# Patient Record
Sex: Female | Born: 1980
Health system: Southern US, Community
[De-identification: ages and names within clinical notes are randomized; demographics above are authoritative.]

## PROBLEM LIST (undated history)

## (undated) DIAGNOSIS — G4733 Obstructive sleep apnea (adult) (pediatric): Secondary | ICD-10-CM

## (undated) DIAGNOSIS — F419 Anxiety disorder, unspecified: Secondary | ICD-10-CM

## (undated) DIAGNOSIS — E119 Type 2 diabetes mellitus without complications: Secondary | ICD-10-CM

## (undated) DIAGNOSIS — M722 Plantar fascial fibromatosis: Secondary | ICD-10-CM

## (undated) DIAGNOSIS — K5909 Other constipation: Secondary | ICD-10-CM

## (undated) DIAGNOSIS — G4730 Sleep apnea, unspecified: Secondary | ICD-10-CM

## (undated) DIAGNOSIS — I10 Essential (primary) hypertension: Secondary | ICD-10-CM

## (undated) DIAGNOSIS — R0602 Shortness of breath: Secondary | ICD-10-CM

## (undated) DIAGNOSIS — K219 Gastro-esophageal reflux disease without esophagitis: Secondary | ICD-10-CM

## (undated) DIAGNOSIS — E782 Mixed hyperlipidemia: Secondary | ICD-10-CM

## (undated) DIAGNOSIS — U071 COVID-19: Secondary | ICD-10-CM

## (undated) DIAGNOSIS — Z9641 Presence of insulin pump (external) (internal): Secondary | ICD-10-CM

## (undated) DIAGNOSIS — Z87898 Personal history of other specified conditions: Secondary | ICD-10-CM

## (undated) DIAGNOSIS — R569 Unspecified convulsions: Secondary | ICD-10-CM

## (undated) DIAGNOSIS — Z794 Long term (current) use of insulin: Secondary | ICD-10-CM

## (undated) DIAGNOSIS — F32A Depression, unspecified: Secondary | ICD-10-CM

## (undated) DIAGNOSIS — M1990 Unspecified osteoarthritis, unspecified site: Secondary | ICD-10-CM

## (undated) DIAGNOSIS — T7840XA Allergy, unspecified, initial encounter: Secondary | ICD-10-CM

## (undated) HISTORY — PX: WISDOM TOOTH EXTRACTION: SHX21

## (undated) HISTORY — PX: TUBAL LIGATION: SHX77

## (undated) HISTORY — DX: Sleep apnea, unspecified: G47.30

## (undated) HISTORY — DX: Unspecified osteoarthritis, unspecified site: M19.90

## (undated) HISTORY — DX: Allergy, unspecified, initial encounter: T78.40XA

## (undated) HISTORY — PX: HERNIA REPAIR: SHX51

---
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
## 2001-07-18 ENCOUNTER — Inpatient Hospital Stay (HOSPITAL_COMMUNITY): Admission: AD | Admit: 2001-07-18 | Discharge: 2001-07-18 | Payer: Self-pay | Admitting: *Deleted

## 2001-07-29 ENCOUNTER — Ambulatory Visit (HOSPITAL_COMMUNITY): Admission: RE | Admit: 2001-07-29 | Discharge: 2001-07-29 | Payer: Self-pay | Admitting: *Deleted

## 2001-09-08 ENCOUNTER — Inpatient Hospital Stay (HOSPITAL_COMMUNITY): Admission: AD | Admit: 2001-09-08 | Discharge: 2001-09-08 | Payer: Self-pay | Admitting: *Deleted

## 2001-09-23 ENCOUNTER — Inpatient Hospital Stay (HOSPITAL_COMMUNITY): Admission: AD | Admit: 2001-09-23 | Discharge: 2001-09-23 | Payer: Self-pay | Admitting: *Deleted

## 2001-11-20 ENCOUNTER — Inpatient Hospital Stay (HOSPITAL_COMMUNITY): Admission: AD | Admit: 2001-11-20 | Discharge: 2001-11-20 | Payer: Self-pay | Admitting: *Deleted

## 2001-12-31 ENCOUNTER — Inpatient Hospital Stay (HOSPITAL_COMMUNITY): Admission: AD | Admit: 2001-12-31 | Discharge: 2001-12-31 | Payer: Self-pay | Admitting: Obstetrics and Gynecology

## 2002-01-02 ENCOUNTER — Encounter (HOSPITAL_COMMUNITY): Admission: RE | Admit: 2002-01-02 | Discharge: 2002-01-02 | Payer: Self-pay | Admitting: Obstetrics and Gynecology

## 2002-01-04 ENCOUNTER — Inpatient Hospital Stay (HOSPITAL_COMMUNITY): Admission: AD | Admit: 2002-01-04 | Discharge: 2002-01-07 | Payer: Self-pay | Admitting: *Deleted

## 2002-04-03 ENCOUNTER — Emergency Department (HOSPITAL_COMMUNITY): Admission: EM | Admit: 2002-04-03 | Discharge: 2002-04-03 | Payer: Self-pay | Admitting: Emergency Medicine

## 2002-04-16 ENCOUNTER — Emergency Department (HOSPITAL_COMMUNITY): Admission: EM | Admit: 2002-04-16 | Discharge: 2002-04-16 | Payer: Self-pay | Admitting: Emergency Medicine

## 2002-05-12 ENCOUNTER — Emergency Department (HOSPITAL_COMMUNITY): Admission: EM | Admit: 2002-05-12 | Discharge: 2002-05-12 | Payer: Self-pay | Admitting: Emergency Medicine

## 2002-07-09 ENCOUNTER — Emergency Department (HOSPITAL_COMMUNITY): Admission: EM | Admit: 2002-07-09 | Discharge: 2002-07-09 | Payer: Self-pay | Admitting: Emergency Medicine

## 2002-08-28 ENCOUNTER — Emergency Department (HOSPITAL_COMMUNITY): Admission: AD | Admit: 2002-08-28 | Discharge: 2002-08-28 | Payer: Self-pay | Admitting: Emergency Medicine

## 2002-11-30 ENCOUNTER — Emergency Department (HOSPITAL_COMMUNITY): Admission: EM | Admit: 2002-11-30 | Discharge: 2002-12-01 | Payer: Self-pay | Admitting: Emergency Medicine

## 2003-01-02 ENCOUNTER — Emergency Department (HOSPITAL_COMMUNITY): Admission: EM | Admit: 2003-01-02 | Discharge: 2003-01-02 | Payer: Self-pay

## 2003-04-14 ENCOUNTER — Emergency Department (HOSPITAL_COMMUNITY): Admission: EM | Admit: 2003-04-14 | Discharge: 2003-04-14 | Payer: Self-pay | Admitting: Emergency Medicine

## 2003-07-20 ENCOUNTER — Emergency Department (HOSPITAL_COMMUNITY): Admission: EM | Admit: 2003-07-20 | Discharge: 2003-07-21 | Payer: Self-pay | Admitting: Emergency Medicine

## 2003-08-23 ENCOUNTER — Emergency Department (HOSPITAL_COMMUNITY): Admission: EM | Admit: 2003-08-23 | Discharge: 2003-08-23 | Payer: Self-pay | Admitting: Emergency Medicine

## 2004-01-01 ENCOUNTER — Emergency Department (HOSPITAL_COMMUNITY): Admission: EM | Admit: 2004-01-01 | Discharge: 2004-01-01 | Payer: Self-pay | Admitting: Vascular Surgery

## 2004-03-31 ENCOUNTER — Emergency Department (HOSPITAL_COMMUNITY): Admission: EM | Admit: 2004-03-31 | Discharge: 2004-03-31 | Payer: Self-pay | Admitting: Emergency Medicine

## 2004-06-19 ENCOUNTER — Emergency Department (HOSPITAL_COMMUNITY): Admission: EM | Admit: 2004-06-19 | Discharge: 2004-06-19 | Payer: Self-pay | Admitting: Emergency Medicine

## 2005-03-06 ENCOUNTER — Emergency Department (HOSPITAL_COMMUNITY): Admission: EM | Admit: 2005-03-06 | Discharge: 2005-03-06 | Payer: Self-pay | Admitting: Emergency Medicine

## 2005-05-07 ENCOUNTER — Emergency Department (HOSPITAL_COMMUNITY): Admission: EM | Admit: 2005-05-07 | Discharge: 2005-05-07 | Payer: Self-pay | Admitting: Family Medicine

## 2005-12-21 ENCOUNTER — Emergency Department (HOSPITAL_COMMUNITY): Admission: EM | Admit: 2005-12-21 | Discharge: 2005-12-21 | Payer: Self-pay | Admitting: Emergency Medicine

## 2006-03-23 ENCOUNTER — Inpatient Hospital Stay (HOSPITAL_COMMUNITY): Admission: AD | Admit: 2006-03-23 | Discharge: 2006-03-23 | Payer: Self-pay | Admitting: Family Medicine

## 2006-03-28 ENCOUNTER — Ambulatory Visit (HOSPITAL_COMMUNITY): Admission: RE | Admit: 2006-03-28 | Discharge: 2006-03-28 | Payer: Self-pay | Admitting: Obstetrics & Gynecology

## 2006-04-23 ENCOUNTER — Ambulatory Visit (HOSPITAL_COMMUNITY): Admission: RE | Admit: 2006-04-23 | Discharge: 2006-04-23 | Payer: Self-pay | Admitting: Obstetrics & Gynecology

## 2006-06-28 ENCOUNTER — Inpatient Hospital Stay (HOSPITAL_COMMUNITY): Admission: AD | Admit: 2006-06-28 | Discharge: 2006-06-28 | Payer: Self-pay | Admitting: Obstetrics

## 2006-08-23 ENCOUNTER — Inpatient Hospital Stay (HOSPITAL_COMMUNITY): Admission: AD | Admit: 2006-08-23 | Discharge: 2006-08-24 | Payer: Self-pay | Admitting: Obstetrics

## 2006-08-30 ENCOUNTER — Inpatient Hospital Stay (HOSPITAL_COMMUNITY): Admission: AD | Admit: 2006-08-30 | Discharge: 2006-08-30 | Payer: Self-pay | Admitting: Obstetrics & Gynecology

## 2006-09-25 ENCOUNTER — Inpatient Hospital Stay (HOSPITAL_COMMUNITY): Admission: RE | Admit: 2006-09-25 | Discharge: 2006-09-26 | Payer: Self-pay | Admitting: Obstetrics

## 2006-09-27 ENCOUNTER — Inpatient Hospital Stay (HOSPITAL_COMMUNITY): Admission: AD | Admit: 2006-09-27 | Discharge: 2006-09-30 | Payer: Self-pay | Admitting: Obstetrics & Gynecology

## 2006-09-29 HISTORY — PX: TUBAL LIGATION: SHX77

## 2006-09-30 ENCOUNTER — Encounter (INDEPENDENT_AMBULATORY_CARE_PROVIDER_SITE_OTHER): Payer: Self-pay | Admitting: Specialist

## 2007-04-14 ENCOUNTER — Emergency Department (HOSPITAL_COMMUNITY): Admission: EM | Admit: 2007-04-14 | Discharge: 2007-04-14 | Payer: Self-pay | Admitting: Emergency Medicine

## 2007-05-31 ENCOUNTER — Emergency Department (HOSPITAL_COMMUNITY): Admission: EM | Admit: 2007-05-31 | Discharge: 2007-05-31 | Payer: Self-pay | Admitting: Emergency Medicine

## 2010-03-16 ENCOUNTER — Emergency Department (HOSPITAL_COMMUNITY): Admission: EM | Admit: 2010-03-16 | Discharge: 2010-03-16 | Payer: Self-pay | Admitting: Emergency Medicine

## 2010-06-17 ENCOUNTER — Emergency Department (HOSPITAL_COMMUNITY)
Admission: EM | Admit: 2010-06-17 | Discharge: 2010-06-17 | Payer: Self-pay | Source: Home / Self Care | Admitting: Emergency Medicine

## 2010-06-19 LAB — URINALYSIS, ROUTINE W REFLEX MICROSCOPIC
Bilirubin Urine: NEGATIVE
Ketones, ur: NEGATIVE mg/dL
Nitrite: NEGATIVE
Protein, ur: NEGATIVE mg/dL
Specific Gravity, Urine: 1.024 (ref 1.005–1.030)
Urine Glucose, Fasting: NEGATIVE mg/dL
Urobilinogen, UA: 2 mg/dL — ABNORMAL HIGH (ref 0.0–1.0)
pH: 6 (ref 5.0–8.0)

## 2010-06-19 LAB — URINE MICROSCOPIC-ADD ON

## 2010-06-19 LAB — POCT PREGNANCY, URINE: Preg Test, Ur: NEGATIVE

## 2010-10-20 NOTE — H&P (Signed)
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   HISTORY OF PRESENT ILLNESS:  Please see the above.  The patient also  complains of an earache.  She denies any congestion or fever.  She  denies any pirulent discharge from the ear.   ALLERGIES:  No known drug allergies.   MEDICATIONS:  None.   RISK FACTORS:  History of a previous cesarean delivery, positive  gonorrhea and chlamydia probes earlier in the pregnancy.   PRENATAL LABORATORIES:  Chlamydia probe negative June 08, 2006, GC  probe negative June 08, 2006.  Urine culture and sensitivity  insignificant growth.  Hematocrit 36.8, hemoglobin 12.3, HIV  nonreactive, hepatis B surface antigen negative, platelets 261,000,  blood type O positive, antibody screen negative, RPR nonreactive,  rubella immune, sickle cell negative, Pap smears negative.  GBS  negative.   PAST GYNECOLOGICAL HISTORY:  History of chlamydia and gonorrhea.   PAST SURGICAL HISTORY:  She denies past surgical history.  Please see  the above.   SOCIAL HISTORY:  She is unemployed, single, never married.  She does not  give any significant history of alcohol abuse.  She has no significant  smoking history.  Denies illicit drug use.   FAMILY HISTORY:  Adult onset diabetes, breast cancer, leukemia.   PAST OBSTETRICAL HISTORY:  In October 1999 she was delivered of a  liveborn female at 41 weeks, 8 pounds 1 oz via  cesarean delivery secondary  to failure to progress and pregnancy-induced hypertension.  In February  2002 she was delivered of a live boy female 7 pounds 11 oz full term,  VBAC, no complications.  In August 2003 she was delivered of a live boy  female 7 pounds 3 oz at 41+ weeks, VBAC, no complications.   PHYSICAL EXAMINATION:  VITAL SIGNS:  Stable, afebrile.  HEART:  Fetal heart racing: reassuring; tocodynamometer: regular uterine  contractions.  ABDOMEN:  Gravid, nontender.  GENITORECTAL:  Sterile vaginal exam by me, cervix is very posterior  approximately 50% effaced.   ASSESSMENT:  Intrauterine pregnancy at term, history of previous  cesarean delivery and vaginal birth after cesarean section now for trial  of labor.  Pregnancy complicated by pelvic pain issues.  At present the  Bishop's score is unfavorable.  Questionable otitis.   PLAN:  Admission induction of labor, possible mechanical induction with  Foley bulb.  Will treat empirically with antibiotics and analgesics for  earache.      Roseanna Rainbow, M.D.  Electronically Signed     LAJ/MEDQ  D:  09/25/2006  T:  09/25/2006  Job:  [id number]

## 2010-10-20 NOTE — H&P (Signed)
NAME:  Sarah Garrison, FENCL             ACCOUNT NO.:  [id number]   MEDICAL RECORD NO.:  [id number]          PATIENT TYPE:  INP   LOCATION:  9170                          [facility]ACILITY:  [facility]   PHYSICIAN:  Roseanna Rainbow, M.D.DATE OF BIRTH:  1980/12/01   DATE OF ADMISSION:  09/27/2006  DATE OF DISCHARGE:                              HISTORY & PHYSICAL   CHIEF COMPLAINT:  The patient is a 30 year old para 3 with an estimated  date of confinement of September 25, 2006, with an intrauterine pregnancy at  40+ weeks, history of a previous cesarean delivery and several VBACs  complaining of uterine contractions.   HISTORY OF PRESENT ILLNESS:  Please see the above.   ALLERGIES:  NO KNOWN DRUG ALLERGIES.   MEDICATIONS:  None.   OBSTETRICAL RISK FACTORS:  1. Previous cesarean delivery.  2. Positive gonorrhea and Chlamydia probes.  3. Asthma.   PRENATAL SCREENING:  Chlamydia, D&E probe negative.  GC probe negative.  Urine culture and sensitivity, insignificant growth.  Hematocrit 36.8,  hemoglobin 12.3.  HIV negative.  Blood type is O positive.  Antibody  screen negative.  Hepatitis B surface antigen negative.  Hematocrit  36.8, hemoglobin 12.3, platelets 261,000.  RPR nonreactive.  Rubella  immune.  Sickle cell negative.  Pap smear negative.   PAST GYNECOLOGIC HISTORY:  Please see the above.   PAST SURGICAL HISTORY:  Previous cesarean delivery.   SOCIAL HISTORY:  Single, does not give any significant history of  alcohol usage, has no significant smoking history, denies illicit drug  use.   FAMILY HISTORY:  Adult onset diabetes, breast cancer, leukemia.   PAST OBSTETRICAL HISTORY:  1. In October 1999, she was delivered of a live-born female, 8 pounds 1      ounce at 41 weeks, via cesarean delivery secondary to failure to      progress.  2. In February 2002, she was delivered of a live-born female, 7 pounds      11 ounces, full term, VBAC, no complications.  3. In August 2003, she was  delivered of a live-born female at 93 plus      weeks, VBAC, no complications.   PHYSICAL EXAMINATION:  VITAL SIGNS:  Stable, afebrile.  Fetal heart  tracing reassuring.  Tocodynamometer:  Uterine contractions every 2-5  minutes.  General uncomfortable.  PELVIC:  Sterile vaginal exam per the RN, the cervix is 5-cm dilated,  70% effaced.   ASSESSMENT:  1. Multipara at term.  2. History of a previous cesarean delivery and several vaginal births      after cesarean section.  3. Latent labor.  4. Fetal heart tracing consistent with fetal well being.   PLAN:  Admission, expectant management.      Roseanna Rainbow, M.D.  Electronically Signed     LAJ/MEDQ  D:  09/27/2006  T:  09/27/2006  Job:  [id number]

## 2010-10-20 NOTE — Op Note (Signed)
Sarah Garrison, Sarah Garrison NO.:  [id number]   MEDICAL RECORD NO.:  [id number]          PATIENT TYPE:  INP   LOCATION:  9137                          [facility]ACILITY:  [facility]   PHYSICIAN:  Roseanna Rainbow, M.D.DATE OF BIRTH:  1981/02/07   DATE OF PROCEDURE:  09/29/2006  DATE OF DISCHARGE:                               OPERATIVE REPORT   PREOPERATIVE DIAGNOSIS:  Multiparity, desires sterilization procedure.   POSTOPERATIVE DIAGNOSIS:  Multiparity, desires sterilization procedure.   PROCEDURE:  Modified Pomeroy bilateral tubal ligation.   SURGEON:  Roseanna Rainbow, M.D.   ANESTHESIA:  Epidural, local.   COMPLICATIONS:  None.   PROCEDURE IN DETAIL:  The patient was taken to the operating room with  an epidural catheter in situ.  The area under the umbilicus was  infiltrated with 0.25% Marcaine.  An infraumbilical skin incision was  then made with a scalpel.  This was carried down to the underlying  fascia.  The fascia was tented up and entered sharply.  The parietal  peritoneum was tented up and entered sharply as well.  There were filmy  adhesions noted involving the omentum and parietal peritoneum.  These  were bluntly dissected.  The retractors were then placed into the  incision.  The right fallopian tube was then grasped with a Babcock  clamp and followed out to its fimbriated end.  The mid isthmic portion  of the tube was regrasped.  A 2 cm segment of tube was totally ligated  with 0 plain ties and excised.  There was a small hematoma noted in the  mesosalpinx.  This was not expanding and in part was tied off by the  ligatures.  The left fallopian tube was manipulated in a similar  fashion.  The fimbriated portion of the left fallopian tube was  visualized, however, there were adhesions involving the ampullary  portion of the tube that limited mobility of the distal aspect of the  tube.  The hematoma noted on the right side was inspected prior to  closing  of the abdomen.  The parietal peritoneum and fascia were  reapproximated in a single layer using a running suture of 0 Vicryl.  The skin was closed in a subcuticular fashion using a running suture of  3-0 Vicryl.  At the close of the procedure the instrument and pack  counts were said to be correct x2.  The patient was taken to the PACU  awake and in stable condition.      Roseanna Rainbow, M.D.  Electronically Signed     LAJ/MEDQ  D:  09/29/2006  T:  09/29/2006  Job:  [id number]

## 2010-11-28 ENCOUNTER — Emergency Department (HOSPITAL_COMMUNITY): Payer: No Typology Code available for payment source

## 2010-11-28 ENCOUNTER — Emergency Department (HOSPITAL_COMMUNITY)
Admission: EM | Admit: 2010-11-28 | Discharge: 2010-11-29 | Disposition: A | Discharge status: Home / Self Care | Payer: No Typology Code available for payment source | Attending: Emergency Medicine | Admitting: Emergency Medicine

## 2010-11-28 DIAGNOSIS — M25519 Pain in unspecified shoulder: Secondary | ICD-10-CM | POA: Insufficient documentation

## 2010-11-28 DIAGNOSIS — Y92410 Unspecified street and highway as the place of occurrence of the external cause: Secondary | ICD-10-CM | POA: Insufficient documentation

## 2010-11-28 DIAGNOSIS — S139XXA Sprain of joints and ligaments of unspecified parts of neck, initial encounter: Secondary | ICD-10-CM | POA: Insufficient documentation

## 2010-11-28 DIAGNOSIS — S335XXA Sprain of ligaments of lumbar spine, initial encounter: Secondary | ICD-10-CM | POA: Insufficient documentation

## 2010-12-16 ENCOUNTER — Emergency Department (HOSPITAL_COMMUNITY)
Admission: EM | Admit: 2010-12-16 | Discharge: 2010-12-16 | Disposition: A | Discharge status: Home / Self Care | Payer: Medicaid Other | Attending: Emergency Medicine | Admitting: Emergency Medicine

## 2010-12-16 DIAGNOSIS — M545 Low back pain, unspecified: Secondary | ICD-10-CM | POA: Insufficient documentation

## 2010-12-16 DIAGNOSIS — E669 Obesity, unspecified: Secondary | ICD-10-CM | POA: Insufficient documentation

## 2010-12-16 DIAGNOSIS — A5901 Trichomonal vulvovaginitis: Secondary | ICD-10-CM | POA: Insufficient documentation

## 2010-12-16 DIAGNOSIS — J45909 Unspecified asthma, uncomplicated: Secondary | ICD-10-CM | POA: Insufficient documentation

## 2010-12-16 DIAGNOSIS — J3489 Other specified disorders of nose and nasal sinuses: Secondary | ICD-10-CM | POA: Insufficient documentation

## 2010-12-16 DIAGNOSIS — R109 Unspecified abdominal pain: Secondary | ICD-10-CM | POA: Insufficient documentation

## 2010-12-16 DIAGNOSIS — R51 Headache: Secondary | ICD-10-CM | POA: Insufficient documentation

## 2010-12-16 DIAGNOSIS — N898 Other specified noninflammatory disorders of vagina: Secondary | ICD-10-CM | POA: Insufficient documentation

## 2010-12-16 LAB — DIFFERENTIAL
Basophils Absolute: 0 10*3/uL (ref 0.0–0.1)
Basophils Relative: 0 % (ref 0–1)
Eosinophils Absolute: 0.1 10*3/uL (ref 0.0–0.7)
Eosinophils Relative: 2 % (ref 0–5)
Lymphocytes Relative: 43 % (ref 12–46)
Lymphs Abs: 2.9 10*3/uL (ref 0.7–4.0)
Monocytes Absolute: 0.6 10*3/uL (ref 0.1–1.0)
Monocytes Relative: 9 % (ref 3–12)
Neutro Abs: 3.2 10*3/uL (ref 1.7–7.7)
Neutrophils Relative %: 46 % (ref 43–77)

## 2010-12-16 LAB — URINALYSIS, ROUTINE W REFLEX MICROSCOPIC
Bilirubin Urine: NEGATIVE
Glucose, UA: NEGATIVE mg/dL
Hgb urine dipstick: NEGATIVE
Ketones, ur: NEGATIVE mg/dL
Nitrite: NEGATIVE
Protein, ur: NEGATIVE mg/dL
Specific Gravity, Urine: 1.016 (ref 1.005–1.030)
Urobilinogen, UA: 1 mg/dL (ref 0.0–1.0)
pH: 8 (ref 5.0–8.0)

## 2010-12-16 LAB — POCT I-STAT, CHEM 8
BUN: 3 mg/dL — ABNORMAL LOW (ref 6–23)
Calcium, Ion: 1.17 mmol/L (ref 1.12–1.32)
Chloride: 104 mEq/L (ref 96–112)
Creatinine, Ser: 0.7 mg/dL (ref 0.50–1.10)
Glucose, Bld: 119 mg/dL — ABNORMAL HIGH (ref 70–99)
HCT: 45 % (ref 36.0–46.0)
Hemoglobin: 15.3 g/dL — ABNORMAL HIGH (ref 12.0–15.0)
Potassium: 3.9 mEq/L (ref 3.5–5.1)
Sodium: 141 mEq/L (ref 135–145)
TCO2: 23 mmol/L (ref 0–100)

## 2010-12-16 LAB — URINE MICROSCOPIC-ADD ON

## 2010-12-16 LAB — WET PREP, GENITAL: Yeast Wet Prep HPF POC: NONE SEEN

## 2010-12-16 LAB — CBC
HCT: 40.2 % (ref 36.0–46.0)
Hemoglobin: 14.4 g/dL (ref 12.0–15.0)
MCH: 31.2 pg (ref 26.0–34.0)
MCHC: 35.8 g/dL (ref 30.0–36.0)
MCV: 87 fL (ref 78.0–100.0)
Platelets: 310 10*3/uL (ref 150–400)
RBC: 4.62 MIL/uL (ref 3.87–5.11)
RDW: 12.5 % (ref 11.5–15.5)
WBC: 6.8 10*3/uL (ref 4.0–10.5)

## 2010-12-16 LAB — POCT PREGNANCY, URINE: Preg Test, Ur: NEGATIVE

## 2010-12-18 LAB — GC/CHLAMYDIA PROBE AMP, GENITAL
Chlamydia, DNA Probe: NEGATIVE
GC Probe Amp, Genital: NEGATIVE

## 2011-05-04 ENCOUNTER — Encounter: Payer: Self-pay | Admitting: *Deleted

## 2011-05-04 ENCOUNTER — Emergency Department (HOSPITAL_COMMUNITY)
Admission: EM | Admit: 2011-05-04 | Discharge: 2011-05-04 | Disposition: A | Discharge status: Home / Self Care | Payer: Medicaid Other | Source: Home / Self Care

## 2011-05-04 DIAGNOSIS — B349 Viral infection, unspecified: Secondary | ICD-10-CM

## 2011-05-04 DIAGNOSIS — B9789 Other viral agents as the cause of diseases classified elsewhere: Secondary | ICD-10-CM

## 2011-05-04 MED ORDER — GUAIFENESIN-CODEINE 100-10 MG/5ML PO SOLN: ORAL | Status: DC

## 2011-05-04 NOTE — ED Provider Notes (Signed)
History     CSN: [id number] Arrival date & time: 05/04/2011 11:38 AM   None     Chief Complaint  Patient presents with  . Cough    (Consider location/radiation/quality/duration/timing/severity/associated sxs/prior treatment) HPI Comments: Onset of nonproductive cough 2 days ago. Chest hurting from cough. No dyspnea or wheezing. No nasal congestion or post nasal drainage, fever or chills. Pts child is being seen today with same symptoms.   Patient is a 30 y.o. female presenting with cough. The history is provided by the patient.  Cough This is a new problem. The current episode started 2 days ago. The problem occurs every few minutes. The problem has not changed since onset.The cough is non-productive. There has been no fever. Associated symptoms include chest pain (with cough) and myalgias. Pertinent negatives include no chills, no ear pain, no rhinorrhea, no sore throat, no shortness of breath and no wheezing. She has tried nothing for the symptoms. Her past medical history is significant for asthma.    Past Medical History  Diagnosis Date  . Asthma     Past Surgical History  Procedure Date  . Cesarean section     Family History  Problem Relation Age of Onset  . Asthma Mother   . Asthma Father     History  Substance Use Topics  . Smoking status: Current Everyday Smoker  . Smokeless tobacco: Not on file  . Alcohol Use: No    OB History    Grav Para Term Preterm Abortions TAB SAB Ect Mult Living                  Review of Systems  Constitutional: Negative for fever, chills and fatigue.  HENT: Negative for ear pain, sore throat, rhinorrhea, sneezing, postnasal drip and sinus pressure.   Respiratory: Positive for cough. Negative for shortness of breath and wheezing.   Cardiovascular: Positive for chest pain (with cough). Negative for palpitations.  Musculoskeletal: Positive for myalgias.    Allergies  Review of patient's allergies indicates no known  allergies.  Home Medications   Current Outpatient Rx  Name Route Sig Dispense Refill  . ALBUTEROL SULFATE HFA 108 (90 BASE) MCG/ACT IN AERS Inhalation Inhale 2 puffs into the lungs every 6 (six) hours as needed.      . GUAIFENESIN-CODEINE 100-10 MG/5ML PO SOLN  1-2 tsp every 6 hrs prn cough 120 mL 0    BP 133/94  Pulse 108  Temp(Src) 98.9 F (37.2 C) (Oral)  Resp 18  SpO2 98%  LMP 05/04/2011  Physical Exam  Nursing note and vitals reviewed. Constitutional: She appears well-developed and well-nourished. No distress.  HENT:  Head: Normocephalic and atraumatic.  Right Ear: Tympanic membrane, external ear and ear canal normal.  Left Ear: Tympanic membrane, external ear and ear canal normal.  Nose: Nose normal.  Mouth/Throat: Uvula is midline, oropharynx is clear and moist and mucous membranes are normal. No oropharyngeal exudate, posterior oropharyngeal edema or posterior oropharyngeal erythema.  Neck: Neck supple.  Cardiovascular: Normal rate, regular rhythm and normal heart sounds.   Pulmonary/Chest: Effort normal and breath sounds normal. No respiratory distress.  Lymphadenopathy:    She has no cervical adenopathy.  Neurological: She is alert.  Skin: Skin is warm and dry.  Psychiatric: She has a normal mood and affect.    ED Course  Procedures (including critical care time)  Labs Reviewed - No data to display No results found.   1. Viral illness       MDM  Melody Comas, [place] 05/18/11 [phone]

## 2011-05-04 NOTE — ED Notes (Signed)
Pt  Reports  Nasal  Congestion  With  Dry  Cough  And  Chest wall pain

## 2011-05-18 NOTE — ED Provider Notes (Signed)
Medical screening examination/treatment/procedure(s) were performed by non-physician practitioner and as supervising physician I was immediately available for consultation/collaboration.  LANEY,RONNIE   Ronnie Laney, MD 05/18/11 1752 

## 2011-08-07 ENCOUNTER — Emergency Department (HOSPITAL_COMMUNITY)
Admission: EM | Admit: 2011-08-07 | Discharge: 2011-08-07 | Disposition: A | Discharge status: Home / Self Care | Payer: Medicaid Other | Attending: Emergency Medicine | Admitting: Emergency Medicine

## 2011-08-07 ENCOUNTER — Encounter (HOSPITAL_COMMUNITY): Payer: Self-pay | Admitting: Cardiology

## 2011-08-07 DIAGNOSIS — H6090 Unspecified otitis externa, unspecified ear: Secondary | ICD-10-CM

## 2011-08-07 DIAGNOSIS — R51 Headache: Secondary | ICD-10-CM | POA: Insufficient documentation

## 2011-08-07 DIAGNOSIS — F17200 Nicotine dependence, unspecified, uncomplicated: Secondary | ICD-10-CM | POA: Insufficient documentation

## 2011-08-07 DIAGNOSIS — H60399 Other infective otitis externa, unspecified ear: Secondary | ICD-10-CM | POA: Insufficient documentation

## 2011-08-07 DIAGNOSIS — J45909 Unspecified asthma, uncomplicated: Secondary | ICD-10-CM | POA: Insufficient documentation

## 2011-08-07 MED ORDER — IBUPROFEN 800 MG PO TABS
800.0000 mg | ORAL_TABLET | Freq: Once | ORAL | Status: AC
  Administered 2011-08-07: 800 mg via ORAL
  Filled 2011-08-07: qty 1

## 2011-08-07 MED ORDER — CIPROFLOXACIN-DEXAMETHASONE 0.3-0.1 % OT SUSP
4.0000 [drp] | Freq: Once | OTIC | Status: AC
  Administered 2011-08-07: 4 [drp] via OTIC
  Filled 2011-08-07: qty 7.5

## 2011-08-07 NOTE — ED Notes (Signed)
Rt ear pain x 2 days making her neck hurt

## 2011-08-07 NOTE — ED Provider Notes (Signed)
History     CSN: [id number]  Arrival date & time 08/07/11  4098   First MD Initiated Contact with Patient 08/07/11 1028      Chief Complaint  Patient presents with  . Otalgia    (Consider location/radiation/quality/duration/timing/severity/associated sxs/prior treatment) Patient is a 31 y.o. female presenting with ear pain. The history is provided by the patient.  Otalgia This is a new problem. The current episode started 2 days ago. There is pain in the right ear. The problem occurs constantly. The problem has been gradually worsening. There has been no fever. The pain is at a severity of 7/10. The pain is moderate. Associated symptoms include headaches. Pertinent negatives include no ear discharge, no hearing loss, no rhinorrhea, no sore throat, no abdominal pain, no diarrhea, no vomiting, no cough and no rash. Her past medical history does not include chronic ear infection, hearing loss or tympanostomy tube.    Past Medical History  Diagnosis Date  . Asthma     Past Surgical History  Procedure Date  . Cesarean section     Family History  Problem Relation Age of Onset  . Asthma Mother   . Asthma Father     History  Substance Use Topics  . Smoking status: Current Everyday Smoker  . Smokeless tobacco: Not on file  . Alcohol Use: No    OB History    Grav Para Term Preterm Abortions TAB SAB Ect Mult Living                  Review of Systems  HENT: Positive for ear pain. Negative for hearing loss, sore throat, rhinorrhea and ear discharge.   Respiratory: Negative for cough.   Gastrointestinal: Negative for vomiting, abdominal pain and diarrhea.  Skin: Negative for rash.  Neurological: Positive for headaches.  All other systems reviewed and are negative.    Allergies  Review of patient's allergies indicates no known allergies.  Home Medications   Current Outpatient Rx  Name Route Sig Dispense Refill  . ALBUTEROL SULFATE HFA 108 (90 BASE) MCG/ACT IN AERS  Inhalation Inhale 2 puffs into the lungs every 6 (six) hours as needed. For shortness of breath.    . ADULT MULTIVITAMIN W/MINERALS CH Oral Take 1 tablet by mouth daily.      BP 136/63  Pulse 90  Temp 98.1 F (36.7 C)  Resp 16  SpO2 99%  Physical Exam  Nursing note and vitals reviewed. Constitutional: She is oriented to person, place, and time. She appears well-developed and well-nourished. No distress.  HENT:  Head: Normocephalic and atraumatic.       Left tympanic membrane normal without bulging, genital tenderness, or ear edema of canal.  Right ear positive for tragal tenderness, increased inflammation of the ear canal, unable to visualize tympanic membrane from swelling.  Eyes: Conjunctivae and EOM are normal.  Neck: Normal range of motion.  Pulmonary/Chest: Effort normal.  Musculoskeletal: Normal range of motion.  Neurological: She is alert and oriented to person, place, and time.  Skin: Skin is warm and dry. No rash noted. She is not diaphoretic.  Psychiatric: She has a normal mood and affect. Her behavior is normal.    ED Course  Procedures (including critical care time)  Labs Reviewed - No data to display No results found.   No diagnosis found.    MDM  Otitis externa  Patient will be given an ENT referral upon discharge for followup.  Patient's pain will be treated with ibuprofen because  she does not tolerate narcotics well.  Cotton wick inserted into canal for medications to work appropriately.  Patient given Ciprodex drops while in the emergency department as well as ibuprofen.        Jaci Carrel, [place] 08/07/11 1108

## 2011-08-07 NOTE — ED Provider Notes (Signed)
Medical screening examination/treatment/procedure(s) were performed by non-physician practitioner and as supervising physician I was immediately available for consultation/collaboration.   Dione Booze, MD 08/07/11 [phone]

## 2011-08-07 NOTE — Discharge Instructions (Signed)
Use the Ciprodex 4 drops in ear twice a day x7 days.  Call the ear specialist listed in your discharge instructions for followup within a week.  Read instructions below on Sarah Garrison return to the emergency department.  Otitis Externa Otitis externa ("swimmer's ear") is a germ (bacterial) or fungal infection of the outer ear canal (from the eardrum to the outside of the ear). Swimming in dirty water may cause swimmer's ear. It also may be caused by moisture in the ear from water remaining after swimming or bathing. Often the first signs of infection may be itching in the ear canal. This may progress to ear canal swelling, redness, and pus drainage, which may be signs of infection. HOME CARE INSTRUCTIONS   Apply the antibiotic drops to the ear canal as prescribed by your doctor.   This can be a very painful medical condition. A strong pain reliever may be prescribed.   Only take over-the-counter or prescription medicines for pain, discomfort, or fever as directed by your caregiver.   If your caregiver has given you a follow-up appointment, it is very important to keep that appointment. Not keeping the appointment could result in a chronic or permanent injury, pain, hearing loss and disability. If there is any problem keeping the appointment, you must call back to this facility for assistance.  PREVENTION   It is important to keep your ear dry. Use the corner of a towel to wick water out of the ear canal after swimming or bathing.   Avoid scratching in your ear. This can damage the ear canal or remove the protective wax lining the canal and make it easier for germs (bacteria) or a fungus to grow.   You may use ear drops made of rubbing alcohol and vinegar after swimming to prevent future "swimmer's ear" infections. Make up a small bottle of equal parts white vinegar and alcohol. Put 3 or 4 drops into each ear after swimming.   Avoid swimming in lakes, polluted water, or poorly chlorinated pools.  SEEK  MEDICAL CARE IF:   An oral temperature above 102 F (38.9 C) develops.   Your ear is still painful after 3 days and shows signs of getting worse (redness, swelling, pain, or pus).  MAKE SURE YOU:   Understand these instructions.   Will watch your condition.   Will get help right away if you are not doing well or get worse.  Document Released: 05/21/2005 Document Revised: 05/10/2011 Document Reviewed: 12/26/2007 [facility] Patient Information 2012 [place], [place].

## 2011-09-13 ENCOUNTER — Emergency Department (HOSPITAL_COMMUNITY)
Admission: EM | Admit: 2011-09-13 | Discharge: 2011-09-13 | Disposition: A | Discharge status: Home / Self Care | Payer: Medicaid Other | Attending: Emergency Medicine | Admitting: Emergency Medicine

## 2011-09-13 ENCOUNTER — Encounter (HOSPITAL_COMMUNITY): Payer: Self-pay | Admitting: Nurse Practitioner

## 2011-09-13 DIAGNOSIS — N939 Abnormal uterine and vaginal bleeding, unspecified: Secondary | ICD-10-CM

## 2011-09-13 DIAGNOSIS — N898 Other specified noninflammatory disorders of vagina: Secondary | ICD-10-CM | POA: Insufficient documentation

## 2011-09-13 DIAGNOSIS — J45909 Unspecified asthma, uncomplicated: Secondary | ICD-10-CM | POA: Insufficient documentation

## 2011-09-13 DIAGNOSIS — F17200 Nicotine dependence, unspecified, uncomplicated: Secondary | ICD-10-CM | POA: Insufficient documentation

## 2011-09-13 LAB — URINALYSIS, ROUTINE W REFLEX MICROSCOPIC
Ketones, ur: NEGATIVE mg/dL
Nitrite: NEGATIVE
pH: 6.5 (ref 5.0–8.0)

## 2011-09-13 LAB — WET PREP, GENITAL: Yeast Wet Prep HPF POC: NONE SEEN

## 2011-09-13 LAB — POCT I-STAT, CHEM 8
Calcium, Ion: 1.18 mmol/L (ref 1.12–1.32)
Glucose, Bld: 119 mg/dL — ABNORMAL HIGH (ref 70–99)
HCT: 41 % (ref 36.0–46.0)
Hemoglobin: 13.9 g/dL (ref 12.0–15.0)
Potassium: 4.1 mEq/L (ref 3.5–5.1)

## 2011-09-13 LAB — URINE MICROSCOPIC-ADD ON

## 2011-09-13 LAB — POCT PREGNANCY, URINE: Preg Test, Ur: NEGATIVE

## 2011-09-13 MED ORDER — LIDOCAINE HCL (PF) 1 % IJ SOLN
INTRAMUSCULAR | Status: AC
  Administered 2011-09-13: 0.9 mL
  Filled 2011-09-13: qty 5

## 2011-09-13 MED ORDER — METRONIDAZOLE 500 MG PO TABS: 500.0000 mg | ORAL_TABLET | Freq: Two times a day (BID) | ORAL | Status: AC

## 2011-09-13 MED ORDER — DOXYCYCLINE HYCLATE 100 MG PO CAPS: 100.0000 mg | ORAL_CAPSULE | Freq: Two times a day (BID) | ORAL | Status: AC

## 2011-09-13 MED ORDER — AZITHROMYCIN 250 MG PO TABS
1000.0000 mg | ORAL_TABLET | Freq: Once | ORAL | Status: AC
  Administered 2011-09-13: 1000 mg via ORAL
  Filled 2011-09-13 (×2): qty 4

## 2011-09-13 MED ORDER — CEFTRIAXONE SODIUM 250 MG IJ SOLR
250.0000 mg | INTRAMUSCULAR | Status: DC
  Administered 2011-09-13: 250 mg via INTRAMUSCULAR
  Filled 2011-09-13 (×2): qty 250

## 2011-09-13 NOTE — Discharge Instructions (Signed)
[name] you have pelvic inflammatory disease today. We gave you a shot of Rocephin in the ER and cranium of azithromycin. You are to take all of the prescriptions that have been given to use today. [facility]up [facility] in a week. Have her partner checked as well.  Do not have intercourse until you have been rechecked. Return to the ER for uncontrolled nausea and vomiting high fever.  Abnormal Vaginal Bleeding Abnormal vaginal bleeding means bleeding from the vagina that is not your normal menstrual period. Bleeding may be heavy or light. It may last for days or come and go. There are many problems that may cause this. HOME CARE  Keep track of your periods on a calendar if they are not regular.   Write down:   How often pads or tampons are changed.   The size and number of clots, if there are any.   A change in the color of the blood.   A change in the amount of blood.   Any smell.   The time and strength of cramps or pain.   Limit activity as told.   Eat a healthy diet.   Do not have sex (intercourse) until your doctor says it is okay.   Never have unprotected sex unless you are trying to get pregnant.   Only take medicine as told by your doctor.  GET HELP RIGHT AWAY IF:   You get dizzy or feel faint when standing up.   You have to change pads or tampons more than once an hour.   You feel a sudden change in your pain.   You start bleeding heavily.   You develop a fever.  MAKE SURE YOU:  Understand these instructions.   Will watch your condition.   Will get help right away if you are not doing well or get worse.  Document Released: 03/18/2009 Document Revised: 05/10/2011 Document Reviewed: 03/18/2009 [facility] Patient Information 2012 [place], [place].Abnormal Vaginal Bleeding Abnormal vaginal bleeding means bleeding from the vagina that is not your normal menstrual period. Bleeding may be heavy or light. It may last for days or  come and go. There are many problems that may cause this. HOME CARE  Keep track of your periods on a calendar if they are not regular.   Write down:   How often pads or tampons are changed.   The size and number of clots, if there are any.   A change in the color of the blood.   A change in the amount of blood.   Any smell.   The time and strength of cramps or pain.   Limit activity as told.   Eat a healthy diet.   Do not have sex (intercourse) until your doctor says it is okay.   Never have unprotected sex unless you are trying to get pregnant.   Only take medicine as told by your doctor.  GET HELP RIGHT AWAY IF:   You get dizzy or feel faint when standing up.   You have to change pads or tampons more than once an hour.   You feel a sudden change in your pain.   You start bleeding heavily.   You develop a fever.  MAKE SURE YOU:  Understand these instructions.   Will watch your condition.   Will get help right away if you are not doing well or get worse.  Document Released: 03/18/2009 Document Revised: 05/10/2011 Document Reviewed: 03/18/2009 [employer] Patient  Information 2012 [place], [place].Abnormal Vaginal Bleeding Abnormal vaginal bleeding means bleeding from the vagina that is not your normal menstrual period. Bleeding may be heavy or light. It may last for days or come and go. There are many problems that may cause this. HOME CARE  Keep track of your periods on a calendar if they are not regular.   Write down:   How often pads or tampons are changed.   The size and number of clots, if there are any.   A change in the color of the blood.   A change in the amount of blood.   Any smell.   The time and strength of cramps or pain.   Limit activity as told.   Eat a healthy diet.   Do not have sex (intercourse) until your doctor says it is okay.   Never have unprotected sex unless you are trying to get pregnant.   Only take medicine as told by  your doctor.  GET HELP RIGHT AWAY IF:   You get dizzy or feel faint when standing up.   You have to change pads or tampons more than once an hour.   You feel a sudden change in your pain.   You start bleeding heavily.   You develop a fever.  MAKE SURE YOU:  Understand these instructions.   Will watch your condition.   Will get help right away if you are not doing well or get worse.  Document Released: 03/18/2009 Document Revised: 05/10/2011 Document Reviewed: 03/18/2009 [facility] Patient Information 2012 [place], [place].Abnormal Vaginal Bleeding Abnormal vaginal bleeding means bleeding from the vagina that is not your normal menstrual period. Bleeding may be heavy or light. It may last for days or come and go. There are many problems that may cause this. HOME CARE  Keep track of your periods on a calendar if they are not regular.   Write down:   How often pads or tampons are changed.   The size and number of clots, if there are any.   A change in the color of the blood.   A change in the amount of blood.   Any smell.   The time and strength of cramps or pain.   Limit activity as told.   Eat a healthy diet.   Do not have sex (intercourse) until your doctor says it is okay.   Never have unprotected sex unless you are trying to get pregnant.   Only take medicine as told by your doctor.  GET HELP RIGHT AWAY IF:   You get dizzy or feel faint when standing up.   You have to change pads or tampons more than once an hour.   You feel a sudden change in your pain.   You start bleeding heavily.   You develop a fever.  MAKE SURE YOU:  Understand these instructions.   Will watch your condition.   Will get help right away if you are not doing well or get worse.  Document Released: 03/18/2009 Document Revised: 05/10/2011 Document Reviewed: 03/18/2009 [facility] Patient Information 2012 [place], [place].

## 2011-09-13 NOTE — ED Provider Notes (Signed)
History     CSN: [id number]  Arrival date & time 09/13/11  4098   First MD Initiated Contact with Patient 09/13/11 0913      Chief Complaint  Patient presents with  . Vaginal Bleeding    (Consider location/radiation/quality/duration/timing/severity/associated sxs/prior treatment) Patient is a 31 y.o. female presenting with vaginal bleeding. The history is provided by the patient. No language interpreter was used.  Vaginal Bleeding This is a new problem. The current episode started 1 to 4 weeks ago. The problem occurs daily. The problem has been gradually worsening. Pertinent negatives include no anorexia, fever, nausea, swollen glands or vomiting. She has tried nothing for the symptoms.   31 year old female presents to the ER with abnormal vaginal bleeding. [facility] States that her periods are always on time. Past medical history of having her tubes tied. States that she's had a discharge for a week now. And her vaginal bleeding started on 4/213. The period before that was 3/26.   Past Medical History  Diagnosis Date  . Asthma     Past Surgical History  Procedure Date  . Cesarean section   . Tubal ligation     Family History  Problem Relation Age of Onset  . Asthma Mother   . Asthma Father     History  Substance Use Topics  . Smoking status: Current Everyday Smoker -- 0.2 packs/day  . Smokeless tobacco: Not on file  . Alcohol Use: Yes     social    OB History    Grav Para Term Preterm Abortions TAB SAB Ect Mult Living                  Review of Systems  Unable to perform ROS Constitutional: Negative.  Negative for fever.  HENT: Negative.   Eyes: Negative.   Respiratory: Negative.   Cardiovascular: Negative.   Gastrointestinal: Negative.  Negative for nausea, vomiting and anorexia.  Genitourinary: Positive for vaginal bleeding, vaginal discharge and menstrual problem. Negative for dysuria, urgency, flank pain, difficulty urinating  and pelvic pain.  Skin: Negative.   Neurological: Negative.   Psychiatric/Behavioral: Negative.   All other systems reviewed and are negative.    Allergies  Review of patient's allergies indicates no known allergies.  Home Medications   Current Outpatient Rx  Name Route Sig Dispense Refill  . ALBUTEROL SULFATE HFA 108 (90 BASE) MCG/ACT IN AERS Inhalation Inhale 2 puffs into the lungs every 6 (six) hours as needed. For shortness of breath.      BP 128/84  Pulse 102  Temp(Src) 98.2 F (36.8 C) (Oral)  Resp 18  Ht 5\' 5"  (1.651 m)  Wt 210 lb (95.255 kg)  BMI 34.95 kg/m2  SpO2 98%  Physical Exam  Nursing note and vitals reviewed. Constitutional: She is oriented to person, place, and time. She appears well-developed and well-nourished.  HENT:  Head: Normocephalic and atraumatic.  Eyes: Conjunctivae and EOM are normal. Pupils are equal, round, and reactive to light.  Neck: Normal range of motion. Neck supple.  Cardiovascular: Normal rate, regular rhythm, normal heart sounds and intact distal pulses.  Exam reveals no gallop and no friction rub.   No murmur heard. Pulmonary/Chest: Effort normal and breath sounds normal.  Abdominal: Soft. Bowel sounds are normal. Hernia confirmed negative in the right inguinal area and confirmed negative in the left inguinal area.  Genitourinary: Uterus normal. Pelvic exam was performed with patient supine. Cervix exhibits motion tenderness, discharge and friability.  Right adnexum displays no mass, no tenderness and no fullness. Left adnexum displays no mass, no tenderness and no fullness. There is tenderness and bleeding around the vagina. Vaginal discharge found.  Musculoskeletal: Normal range of motion. She exhibits no edema and no tenderness.  Lymphadenopathy:       Right: No inguinal adenopathy present.       Left: No inguinal adenopathy present.  Neurological: She is alert and oriented to person, place, and time. She has normal reflexes.    Skin: Skin is warm and dry.  Psychiatric: She has a normal mood and affect.    ED Course  Pelvic exam Date/Time: 09/13/2011 10:59 AM Performed by: Remi Haggard Authorized by: Remi Haggard Consent: Verbal consent obtained. Risks and benefits: risks, benefits and alternatives were discussed Consent given by: patient Patient understanding: patient states understanding of the procedure being performed Patient identity confirmed: verbally with patient, arm band, provided demographic data and hospital-assigned identification number Time out: Immediately prior to procedure a "time out" was called to verify the correct patient, procedure, equipment, support staff and site/side marked as required. Local anesthesia used: no Patient sedated: no Patient tolerance: Patient tolerated the procedure well with no immediate complications.   (including critical care time)  Labs Reviewed  WET PREP, GENITAL - Abnormal; Notable for the following:    Clue Cells Wet Prep HPF POC MODERATE (*)    WBC, Wet Prep HPF POC MODERATE (*)    All other components within normal limits  URINALYSIS, ROUTINE W REFLEX MICROSCOPIC - Abnormal; Notable for the following:    Hgb urine dipstick LARGE (*)    Leukocytes, UA TRACE (*)    All other components within normal limits  POCT I-STAT, CHEM 8 - Abnormal; Notable for the following:    Glucose, Bld 119 (*)    All other components within normal limits  URINE MICROSCOPIC-ADD ON - Abnormal; Notable for the following:    Squamous Epithelial / LPF FEW (*)    Bacteria, UA FEW (*)    All other components within normal limits  POCT PREGNANCY, URINE  GC/CHLAMYDIA PROBE AMP, GENITAL   No results found.   No diagnosis found.    MDM  Abnomal vaginal bleeding with discharge x 1 week.  Dx: PID.  Treated in ER with Rocephen 250mg  im and azithromycin 1gm.  Patient will take flagyl and doxycycline.  Recheck in 1 week with [facility] and no intercourse until then.   Have partner checked as well.  Return for severe pain or uncontrolled n/v and fever.        Remi Haggard, NP 09/13/11 1101

## 2011-09-13 NOTE — ED Notes (Signed)
Pt reports 2 menustral cycles over past month. Denies pain. Spotting today. Denies bowel/bladder changes

## 2011-09-15 NOTE — ED Provider Notes (Signed)
Medical screening examination/treatment/procedure(s) were performed by non-physician practitioner and as supervising physician I was immediately available for consultation/collaboration.  Detroit Frieden, MD 09/15/11 0305 

## 2012-03-09 ENCOUNTER — Emergency Department (HOSPITAL_COMMUNITY)
Admission: EM | Admit: 2012-03-09 | Discharge: 2012-03-09 | Disposition: A | Discharge status: Home / Self Care | Payer: Medicaid Other | Attending: Emergency Medicine | Admitting: Emergency Medicine

## 2012-03-09 ENCOUNTER — Emergency Department (HOSPITAL_COMMUNITY): Payer: Medicaid Other

## 2012-03-09 ENCOUNTER — Encounter (HOSPITAL_COMMUNITY): Payer: Self-pay | Admitting: Emergency Medicine

## 2012-03-09 DIAGNOSIS — M25519 Pain in unspecified shoulder: Secondary | ICD-10-CM | POA: Insufficient documentation

## 2012-03-09 DIAGNOSIS — S139XXA Sprain of joints and ligaments of unspecified parts of neck, initial encounter: Secondary | ICD-10-CM | POA: Insufficient documentation

## 2012-03-09 DIAGNOSIS — S0990XA Unspecified injury of head, initial encounter: Secondary | ICD-10-CM | POA: Insufficient documentation

## 2012-03-09 DIAGNOSIS — Y9229 Other specified public building as the place of occurrence of the external cause: Secondary | ICD-10-CM | POA: Insufficient documentation

## 2012-03-09 DIAGNOSIS — M542 Cervicalgia: Secondary | ICD-10-CM | POA: Insufficient documentation

## 2012-03-09 DIAGNOSIS — M79609 Pain in unspecified limb: Secondary | ICD-10-CM | POA: Insufficient documentation

## 2012-03-09 DIAGNOSIS — T07XXXA Unspecified multiple injuries, initial encounter: Secondary | ICD-10-CM

## 2012-03-09 DIAGNOSIS — IMO0002 Reserved for concepts with insufficient information to code with codable children: Secondary | ICD-10-CM | POA: Insufficient documentation

## 2012-03-09 DIAGNOSIS — S161XXA Strain of muscle, fascia and tendon at neck level, initial encounter: Secondary | ICD-10-CM

## 2012-03-09 DIAGNOSIS — S46912A Strain of unspecified muscle, fascia and tendon at shoulder and upper arm level, left arm, initial encounter: Secondary | ICD-10-CM

## 2012-03-09 MED ORDER — TETANUS-DIPHTH-ACELL PERTUSSIS 5-2.5-18.5 LF-MCG/0.5 IM SUSP
0.5000 mL | Freq: Once | INTRAMUSCULAR | Status: AC
  Administered 2012-03-09: 0.5 mL via INTRAMUSCULAR
  Filled 2012-03-09: qty 0.5

## 2012-03-09 MED ORDER — IBUPROFEN 400 MG PO TABS: 400.0000 mg | ORAL_TABLET | Freq: Four times a day (QID) | ORAL | Status: DC | PRN

## 2012-03-09 MED ORDER — IBUPROFEN 400 MG PO TABS
400.0000 mg | ORAL_TABLET | Freq: Once | ORAL | Status: AC
  Administered 2012-03-09: 400 mg via ORAL
  Filled 2012-03-09: qty 1

## 2012-03-09 NOTE — ED Notes (Signed)
Attempt to call pt to rm no response. 
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
## 2012-03-09 NOTE — ED Notes (Signed)
Pt to xray via wheelchair
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
## 2014-05-20 ENCOUNTER — Emergency Department (HOSPITAL_COMMUNITY)
Admission: EM | Admit: 2014-05-20 | Discharge: 2014-05-20 | Disposition: A | Discharge status: Home / Self Care | Payer: Medicaid Other | Attending: Emergency Medicine | Admitting: Emergency Medicine

## 2014-05-20 ENCOUNTER — Encounter (HOSPITAL_COMMUNITY): Payer: Self-pay | Admitting: Emergency Medicine

## 2014-05-20 DIAGNOSIS — S00412A Abrasion of left ear, initial encounter: Secondary | ICD-10-CM | POA: Diagnosis not present

## 2014-05-20 DIAGNOSIS — Y998 Other external cause status: Secondary | ICD-10-CM | POA: Insufficient documentation

## 2014-05-20 DIAGNOSIS — X58XXXA Exposure to other specified factors, initial encounter: Secondary | ICD-10-CM | POA: Insufficient documentation

## 2014-05-20 DIAGNOSIS — Y9289 Other specified places as the place of occurrence of the external cause: Secondary | ICD-10-CM | POA: Insufficient documentation

## 2014-05-20 DIAGNOSIS — Z720 Tobacco use: Secondary | ICD-10-CM | POA: Insufficient documentation

## 2014-05-20 DIAGNOSIS — Y9389 Activity, other specified: Secondary | ICD-10-CM | POA: Diagnosis not present

## 2014-05-20 DIAGNOSIS — T162XXA Foreign body in left ear, initial encounter: Secondary | ICD-10-CM | POA: Diagnosis not present

## 2014-05-20 DIAGNOSIS — J45909 Unspecified asthma, uncomplicated: Secondary | ICD-10-CM | POA: Insufficient documentation

## 2014-05-20 DIAGNOSIS — H9202 Otalgia, left ear: Secondary | ICD-10-CM | POA: Diagnosis present

## 2014-05-20 MED ORDER — NEOMYCIN-POLYMYXIN-HC 3.5-10000-1 OT SUSP: 4.0000 [drp] | Freq: Four times a day (QID) | OTIC | Status: DC

## 2014-05-20 MED ORDER — IBUPROFEN 400 MG PO TABS
600.0000 mg | ORAL_TABLET | Freq: Once | ORAL | Status: AC
  Administered 2014-05-20: 600 mg via ORAL
  Filled 2014-05-20 (×2): qty 1

## 2014-05-20 MED ORDER — LIDOCAINE VISCOUS 2 % MT SOLN
15.0000 mL | Freq: Once | OROMUCOSAL | Status: AC
  Administered 2014-05-20: 15 mL via OROMUCOSAL
  Filled 2014-05-20: qty 15

## 2014-05-20 NOTE — ED Provider Notes (Signed)
CSN: [id number]     Arrival date & time 05/20/14  [id number] History  This chart was scribed for non-physician practitioner, Allen DerryMercedes Camprubi-Soms, PA-C, working with Mirian MoMatthew Gentry, MD by Charline BillsEssence Howell, ED Scribe. This patient was seen in room TR10C/TR10C and the patient's care was started at 10:14 AM.   Chief Complaint  Patient presents with  . Otalgia   Patient is a 33 y.o. female presenting with ear pain. The history is provided by the patient. No language interpreter was used.  Otalgia Location:  Left Quality:  Sharp Severity:  Severe Onset quality:  Sudden Duration:  2 hours Timing:  Constant Progression:  Worsening Chronicity:  New Context: foreign body   Relieved by:  Nothing Worsened by:  Nothing tried Ineffective treatments:  OTC medications Associated symptoms: no congestion, no cough, no ear discharge, no fever, no rhinorrhea and no sore throat    HPI Comments: Sarah Garrison is a 33 y.o. female who presents to the Emergency Department complaining of persistent L ear pain over the past 2 hours. Pt states that she was changing coats outside when she felt a bug fly into her ear. Pt describes the pain as a 7/10 constant non-radiating, sharp sensation. She also reports that she can hear a buzz. Pt denies fever, ear discharge, rhinorrhea, cough, sore throat. Pt applied Peroxide inside her ear without relief.   Past Medical History  Diagnosis Date  . Asthma    Past Surgical History  Procedure Laterality Date  . Cesarean section    . Tubal ligation     Family History  Problem Relation Age of Onset  . Asthma Mother   . Asthma Father    History  Substance Use Topics  . Smoking status: Current Every Day Smoker -- 0.25 packs/day    Types: Cigarettes  . Smokeless tobacco: Not on file  . Alcohol Use: Yes     Comment: social   OB History    No data available     Review of Systems  Constitutional: Negative for fever and chills.  HENT: Positive for ear pain. Negative  for congestion, ear discharge, rhinorrhea and sore throat.   Eyes: Negative for pain and discharge.  Respiratory: Negative for cough.   10 Systems reviewed and all are negative for acute change except as noted in the HPI.  Allergies  Review of patient's allergies indicates no known allergies.  Home Medications   Prior to Admission medications   Medication Sig Start Date End Date Taking? Authorizing Provider  ibuprofen (ADVIL,MOTRIN) 400 MG tablet Take 1 tablet (400 mg total) by mouth every 6 (six) hours as needed for pain. 03/09/12   Hurman HornJohn M Bednar, MD  OVER THE COUNTER MEDICATION Take 2 each by mouth daily. Flinstones Vitamins.    Historical Provider, MD   Triage Vitals: BP 152/96 mmHg  Pulse 101  Temp(Src) 97.1 F (36.2 C) (Oral)  Resp 18  Ht 5\' 5"  (1.651 m)  Wt 210 lb (95.255 kg)  BMI 34.95 kg/m2  SpO2 100%  LMP 05/18/2014 Physical Exam  Constitutional: She is oriented to person, place, and time. Vital signs are normal. She appears well-developed and well-nourished.  Non-toxic appearance. No distress.  Afebrile, nontoxic, uncomfortable with L ear, intermittently screams stating there's something moving inside  HENT:  Head: Normocephalic and atraumatic.  Right Ear: Hearing, tympanic membrane, external ear and ear canal normal. No middle ear effusion.  Left Ear: Tympanic membrane normal. A foreign body (insect) is present. Tympanic membrane is not injected,  not erythematous and not bulging.  No middle ear effusion.  Nose: Nose normal.  Mouth/Throat: Oropharynx is clear and moist and mucous membranes are normal. No oropharyngeal exudate.  Small insect in L ear. After removal, mild maceration and erythema to canal with TM intact and nonerythematous or bulging.  Eyes: Conjunctivae and EOM are normal. Right eye exhibits no discharge. Left eye exhibits no discharge.  Neck: Normal range of motion. Neck supple.  Cardiovascular: Normal rate and intact distal pulses.   Pulmonary/Chest:  Effort normal. No respiratory distress.  Abdominal: Normal appearance. She exhibits no distension.  Musculoskeletal: Normal range of motion.  Neurological: She is alert and oriented to person, place, and time.  Skin: Skin is warm, dry and intact. No rash noted.  Psychiatric: She has a normal mood and affect. Her behavior is normal.  Nursing note and vitals reviewed.  ED Course  FOREIGN BODY REMOVAL Date/Time: 05/20/2014 11:36 AM Performed by: Marjean Donna, Jais Demir STRUPP Authorized by: Ramond Marrow Consent: Verbal consent obtained. Risks and benefits: risks, benefits and alternatives were discussed Consent given by: patient Patient understanding: patient states understanding of the procedure being performed Patient consent: the patient's understanding of the procedure matches consent given Patient identity confirmed: verbally with patient Body area: ear Location details: left ear Anesthesia: local infiltration Local anesthetic: topical anesthetic (viscous lidocaine) Anesthetic total: 3 ml Patient sedated: no Patient restrained: no Patient cooperative: yes Localization method: visualized and ENT speculum Removal mechanism: curette and irrigation Complexity: simple 1 objects recovered. Objects recovered: insect Post-procedure assessment: foreign body removed Patient tolerance: Patient tolerated the procedure well with no immediate complications Comments: Small fly/insect removed from L ear after irrigation and curette use   (including critical care time) DIAGNOSTIC STUDIES: Oxygen Saturation is 100% on RA, normal by my interpretation.    COORDINATION OF CARE: 10:17 AM-Discussed treatment plan which includes foreign body removal with pt at bedside and pt agreed to plan.   Labs Review Labs Reviewed - No data to display  Imaging Review No results found.   EKG Interpretation None      MDM   Final diagnoses:  Ear foreign body, left, initial  encounter  Excoriation of ear canal, left, initial encounter    34 y.o. female with an insect in her L ear. Successfully removed with curette and flushing after viscous lidocaine was placed. TM clear with ear canal mildly erythematous after procedure, slight maceration to skin of canal. TM intact. Will empirically cover for possible infection using antibiotic drops. Given ibuprofen here after procedure for some pain associated with this. Discussed f/up with PCP in 3-5 days for ongoing eval and management. I explained the diagnosis and have given explicit precautions to return to the ER including for any other new or worsening symptoms. The patient understands and accepts the medical plan as it's been dictated and I have answered their questions. Discharge instructions concerning home care and prescriptions have been given. The patient is STABLE and is discharged to home in good condition.   I personally performed the services described in this documentation, which was scribed in my presence. The recorded information has been reviewed and is accurate.  BP 152/96 mmHg  Pulse 101  Temp(Src) 97.1 F (36.2 C) (Oral)  Resp 18  Ht 5\' 5"  (1.651 m)  Wt 210 lb (95.255 kg)  BMI 34.95 kg/m2  SpO2 100%  LMP 05/18/2014  Meds ordered this encounter  Medications  . lidocaine (XYLOCAINE) 2 % viscous mouth solution 15 mL  Sig:   . ibuprofen (ADVIL,MOTRIN) tablet 600 mg    Sig:   . neomycin-polymyxin-hydrocortisone (CORTISPORIN) 3.5-10000-1 otic suspension    Sig: Place 4 drops into the left ear 4 (four) times daily. X 7 days    Dispense:  10 mL    Refill:  0    Order Specific Question:  Supervising Provider    Answer:  Vida RollerMILLER, BRIAN D [id number]]     Julian Askin Strupp Kilbourneamprubi-Soms, PA-C 05/20/14 1144  Mirian MoMatthew Gentry, MD 05/24/14 [phone]

## 2014-05-20 NOTE — Discharge Instructions (Signed)
Use cortisporin drops as directed to help prevent any infection that may occur after the insect was removed from your ear. Use tylenol or motrin for any pain or headaches. See your regular doctor in 3-5 days for recheck of your ear. Return to the ER for changes or worsening symptoms   Ear Foreign Body An ear foreign body is an object that is stuck in the ear. Objects in the ear can cause pain, hearing loss, and buzzing or roaring sounds. They can also cause fluid to come from the ear. HOME CARE   Keep all doctor visits as told.  Keep small objects away from children. Tell them not to put things in their ears. GET HELP RIGHT AWAY IF:   You have blood coming from your ear.  You have more pain or puffiness (swelling) in the ear.  You have trouble hearing.  You have fluid (discharge) coming from the ear.  You have a fever.  You get a headache. MAKE SURE YOU:   Understand these instructions.  Will watch your condition.  Will get help right away if you are not doing well or get worse. Document Released: 11/08/2009 Document Revised: 08/13/2011 Document Reviewed: 11/08/2009 [facility] Patient Information 2015 [employer] [place]. This information is not intended to replace advice given to you by your health care provider. Make sure you discuss any questions you have with your health care provider.

## 2014-05-20 NOTE — ED Notes (Signed)
Patient states having ear pain.  Patient states she thinks that a bug may have gotten in ear because she hears a buzz.

## 2014-07-14 ENCOUNTER — Encounter (HOSPITAL_COMMUNITY): Payer: Self-pay | Admitting: *Deleted

## 2014-07-14 ENCOUNTER — Inpatient Hospital Stay (HOSPITAL_COMMUNITY)
Admission: AD | Admit: 2014-07-14 | Discharge: 2014-07-14 | Disposition: A | Discharge status: Home / Self Care | Payer: Medicaid Other | Source: Ambulatory Visit | Attending: Family Medicine | Admitting: Family Medicine

## 2014-07-14 DIAGNOSIS — R1013 Epigastric pain: Secondary | ICD-10-CM | POA: Insufficient documentation

## 2014-07-14 DIAGNOSIS — R1010 Upper abdominal pain, unspecified: Secondary | ICD-10-CM | POA: Diagnosis present

## 2014-07-14 DIAGNOSIS — N926 Irregular menstruation, unspecified: Secondary | ICD-10-CM | POA: Insufficient documentation

## 2014-07-14 DIAGNOSIS — F17210 Nicotine dependence, cigarettes, uncomplicated: Secondary | ICD-10-CM | POA: Insufficient documentation

## 2014-07-14 HISTORY — DX: Gastro-esophageal reflux disease without esophagitis: K21.9

## 2014-07-14 LAB — CBC
HEMATOCRIT: 38.9 % (ref 36.0–46.0)
HEMOGLOBIN: 13.2 g/dL (ref 12.0–15.0)
MCH: 29.7 pg (ref 26.0–34.0)
MCHC: 33.9 g/dL (ref 30.0–36.0)
MCV: 87.6 fL (ref 78.0–100.0)
PLATELETS: 335 10*3/uL (ref 150–400)
RBC: 4.44 MIL/uL (ref 3.87–5.11)
RDW: 12.9 % (ref 11.5–15.5)
WBC: 8.1 10*3/uL (ref 4.0–10.5)

## 2014-07-14 LAB — ABO/RH: ABO/RH(D): O POS

## 2014-07-14 LAB — URINALYSIS, ROUTINE W REFLEX MICROSCOPIC
Bilirubin Urine: NEGATIVE
Glucose, UA: 250 mg/dL — AB
Hgb urine dipstick: NEGATIVE
Ketones, ur: NEGATIVE mg/dL
NITRITE: NEGATIVE
PH: 5.5 (ref 5.0–8.0)
Protein, ur: NEGATIVE mg/dL
Urobilinogen, UA: 0.2 mg/dL (ref 0.0–1.0)

## 2014-07-14 LAB — URINE MICROSCOPIC-ADD ON

## 2014-07-14 LAB — POCT PREGNANCY, URINE: PREG TEST UR: NEGATIVE

## 2014-07-14 LAB — HCG, QUANTITATIVE, PREGNANCY: hCG, Beta Chain, Quant, S: <1 m[IU]/mL (ref <5)

## 2014-07-14 NOTE — MAU Note (Signed)
Pt now states that her last positive pregnancy test was in November, before Thanksgiving. She says that she has not been n/v since 3 weeks ago.

## 2014-07-14 NOTE — MAU Provider Note (Signed)
History     CSN: [id number]  Arrival date and time: 07/14/14 0431   None     Chief Complaint  Patient presents with  . Abdominal Pain   HPI  Sarah Garrison is a 34 y.o. [id number] who presents today with upper abdominal pain. She states that she has had the pain off and on for about 3 weeks. She has been taking ibuprofen, and states that it is not helping. She states that the pain is worse when she yawns, but she does not notice any other aggravating factors. She denies any NV/D, shortness of breath or chest pain. She has not tried anything other than ibuprofen. She states that she left work to come here and need a note for work.   She is also concerned that she may be pregnant or have had a miscarriage. She states that she has not had a period since November, and has had a +UPT at home. She has not had any bleeding.   Past Medical History  Diagnosis Date  . Asthma   . GERD (gastroesophageal reflux disease)     Past Surgical History  Procedure Laterality Date  . Cesarean section    . Tubal ligation      Family History  Problem Relation Age of Onset  . Asthma Mother   . Asthma Father     History  Substance Use Topics  . Smoking status: Current Every Day Smoker -- 0.25 packs/day    Types: Cigarettes  . Smokeless tobacco: Never Used  . Alcohol Use: No    Allergies: No Known Allergies  Prescriptions prior to admission  Medication Sig Dispense Refill Last Dose  . ibuprofen (ADVIL,MOTRIN) 400 MG tablet Take 1 tablet (400 mg total) by mouth every 6 (six) hours as needed for pain. 30 tablet 0 unknown  . neomycin-polymyxin-hydrocortisone (CORTISPORIN) 3.5-10000-1 otic suspension Place 4 drops into the left ear 4 (four) times daily. X 7 days 10 mL 0     ROS Physical Exam   Blood pressure 124/76, pulse 98, resp. rate 18, height 5\' 5"  (1.651 m), weight 107.502 kg (237 lb).  Physical Exam  Nursing note and vitals reviewed. Constitutional: She is oriented to person,  place, and time. She appears well-developed and well-nourished. No distress.  Cardiovascular: Normal rate.   Respiratory: Effort normal.  GI: Soft. There is no tenderness. There is no rebound and no guarding.  Neurological: She is alert and oriented to person, place, and time.  Skin: Skin is warm and dry.  Psychiatric: She has a normal mood and affect.    MAU Course  Procedures  Results for orders placed or performed during the hospital encounter of 07/14/14 (from the past 24 hour(s))  Urinalysis, Routine w reflex microscopic     Status: Abnormal   Collection Time: 07/14/14  4:40 AM  Result Value Ref Range   Color, Urine YELLOW YELLOW   APPearance CLEAR CLEAR   Specific Gravity, Urine >1.030 (H) 1.005 - 1.030   pH 5.5 5.0 - 8.0   Glucose, UA 250 (A) NEGATIVE mg/dL   Hgb urine dipstick NEGATIVE NEGATIVE   Bilirubin Urine NEGATIVE NEGATIVE   Ketones, ur NEGATIVE NEGATIVE mg/dL   Protein, ur NEGATIVE NEGATIVE mg/dL   Urobilinogen, UA 0.2 0.0 - 1.0 mg/dL   Nitrite NEGATIVE NEGATIVE   Leukocytes, UA TRACE (A) NEGATIVE  Urine microscopic-add on     Status: Abnormal   Collection Time: 07/14/14  4:40 AM  Result Value Ref Range   Squamous  Epithelial / LPF RARE RARE   WBC, UA 3-6 <3 WBC/hpf   RBC / HPF 7-10 <3 RBC/hpf   Bacteria, UA FEW (A) RARE  Pregnancy, urine POC     Status: None   Collection Time: 07/14/14  4:59 AM  Result Value Ref Range   Preg Test, Ur NEGATIVE NEGATIVE  hCG, quantitative, pregnancy     Status: None   Collection Time: 07/14/14  5:10 AM  Result Value Ref Range   hCG, Beta Chain, Quant, S <1 <5 mIU/mL  CBC     Status: None   Collection Time: 07/14/14  5:10 AM  Result Value Ref Range   WBC 8.1 4.0 - 10.5 K/uL   RBC 4.44 3.87 - 5.11 MIL/uL   Hemoglobin 13.2 12.0 - 15.0 g/dL   HCT 16.1 09.6 - 04.5 %   MCV 87.6 78.0 - 100.0 fL   MCH 29.7 26.0 - 34.0 pg   MCHC 33.9 30.0 - 36.0 g/dL   RDW 40.9 81.1 - 91.4 %   Platelets 335 150 - 400 K/uL  ABO/Rh      Status: None (Preliminary result)   Collection Time: 07/14/14  5:10 AM  Result Value Ref Range   ABO/RH(D) O POS      Assessment and Plan   1. Epigastric pain   2. Irregular menstrual cycle    Reassured patient that she is not pregnant. HCG and UPT are both negative Trail of PPI for 2 weeks, if no improvement go to [facility] or [facility] for evaluation Return to MAU as needed  Follow-up Information    Follow up with [facility].   Why:  If symptoms worsen   Contact information:   [address] [id number] mc         Tawnya Crook 07/14/2014, 6:59 AM

## 2014-07-14 NOTE — MAU Note (Signed)
Pt presents with abdominal pain for 3 days. States she had tubal ligation in 2008 but has had 6 + preg tests in past 2 months. Pt passed large clot of blood on 2/5. Has had n/v for a month and excessive heart burn.

## 2014-07-14 NOTE — Discharge Instructions (Signed)
Gastroesophageal Reflux Disease, Adult Gastroesophageal reflux disease (GERD) happens when acid from your stomach flows up into the esophagus. When acid comes in contact with the esophagus, the acid causes soreness (inflammation) in the esophagus. Over time, GERD may create small holes (ulcers) in the lining of the esophagus. CAUSES   Increased body weight. This puts pressure on the stomach, making acid rise from the stomach into the esophagus.  Smoking. This increases acid production in the stomach.  Drinking alcohol. This causes decreased pressure in the lower esophageal sphincter (valve or ring of muscle between the esophagus and stomach), allowing acid from the stomach into the esophagus.  Late evening meals and a full stomach. This increases pressure and acid production in the stomach.  A malformed lower esophageal sphincter. Sometimes, no cause is found. SYMPTOMS   Burning pain in the lower part of the mid-chest behind the breastbone and in the mid-stomach area. This may occur twice a week or more often.  Trouble swallowing.  Sore throat.  Dry cough.  Asthma-like symptoms including chest tightness, shortness of breath, or wheezing. DIAGNOSIS  Your caregiver may be able to diagnose GERD based on your symptoms. In some cases, X-rays and other tests may be done to check for complications or to check the condition of your stomach and esophagus. TREATMENT  Your caregiver may recommend over-the-counter or prescription medicines to help decrease acid production. Ask your caregiver before starting or adding any new medicines.  HOME CARE INSTRUCTIONS   Change the factors that you can control. Ask your caregiver for guidance concerning weight loss, quitting smoking, and alcohol consumption.  Avoid foods and drinks that make your symptoms worse, such as:  Caffeine or alcoholic drinks.  Chocolate.  Peppermint or mint flavorings.  Garlic and onions.  Spicy foods.  Citrus fruits,  such as oranges, lemons, or limes.  Tomato-based foods such as sauce, chili, salsa, and pizza.  Fried and fatty foods.  Avoid lying down for the 3 hours prior to your bedtime or prior to taking a nap.  Eat small, frequent meals instead of large meals.  Wear loose-fitting clothing. Do not wear anything tight around your waist that causes pressure on your stomach.  Raise the head of your bed 6 to 8 inches with wood blocks to help you sleep. Extra pillows will not help.  Only take over-the-counter or prescription medicines for pain, discomfort, or fever as directed by your caregiver.  Do not take aspirin, ibuprofen, or other nonsteroidal anti-inflammatory drugs (NSAIDs). SEEK IMMEDIATE MEDICAL CARE IF:   You have pain in your arms, neck, jaw, teeth, or back.  Your pain increases or changes in intensity or duration.  You develop nausea, vomiting, or sweating (diaphoresis).  You develop shortness of breath, or you faint.  Your vomit is green, yellow, black, or looks like coffee grounds or blood.  Your stool is red, bloody, or black. These symptoms could be signs of other problems, such as heart disease, gastric bleeding, or esophageal bleeding. MAKE SURE YOU:   Understand these instructions.  Will watch your condition.  Will get help right away if you are not doing well or get worse. Document Released: 02/28/2005 Document Revised: 08/13/2011 Document Reviewed: 12/08/2010 ExitCare Patient Information 2015 [employer]. This information is not intended to replace advice given to you by your health care provider. Make sure you discuss any questions you have with your health care provider.  

## 2014-11-07 ENCOUNTER — Emergency Department (HOSPITAL_COMMUNITY)
Admission: EM | Admit: 2014-11-07 | Discharge: 2014-11-07 | Disposition: A | Discharge status: Home / Self Care | Payer: Medicaid Other | Attending: Emergency Medicine | Admitting: Emergency Medicine

## 2014-11-07 ENCOUNTER — Encounter (HOSPITAL_COMMUNITY): Payer: Self-pay | Admitting: *Deleted

## 2014-11-07 DIAGNOSIS — K088 Other specified disorders of teeth and supporting structures: Secondary | ICD-10-CM | POA: Insufficient documentation

## 2014-11-07 DIAGNOSIS — Z720 Tobacco use: Secondary | ICD-10-CM | POA: Insufficient documentation

## 2014-11-07 DIAGNOSIS — Z3202 Encounter for pregnancy test, result negative: Secondary | ICD-10-CM | POA: Insufficient documentation

## 2014-11-07 DIAGNOSIS — K0889 Other specified disorders of teeth and supporting structures: Secondary | ICD-10-CM

## 2014-11-07 DIAGNOSIS — J45909 Unspecified asthma, uncomplicated: Secondary | ICD-10-CM | POA: Diagnosis not present

## 2014-11-07 DIAGNOSIS — E669 Obesity, unspecified: Secondary | ICD-10-CM | POA: Insufficient documentation

## 2014-11-07 LAB — POC URINE PREG, ED: Preg Test, Ur: NEGATIVE

## 2014-11-07 MED ORDER — PENICILLIN V POTASSIUM 500 MG PO TABS: 500.0000 mg | ORAL_TABLET | Freq: Four times a day (QID) | ORAL | Status: AC

## 2014-11-07 MED ORDER — KETOROLAC TROMETHAMINE 60 MG/2ML IM SOLN
60.0000 mg | Freq: Once | INTRAMUSCULAR | Status: AC
  Administered 2014-11-07: 60 mg via INTRAMUSCULAR
  Filled 2014-11-07: qty 2

## 2014-11-07 NOTE — ED Notes (Signed)
Pt states she had 4 wisdom teeth removed Tuesday.  She told them not to give her hydrocodone b/c it makes her sick and doesn't work.  But the pain is unbearable.  She called the dentists office and they have not called her back.

## 2014-11-07 NOTE — Discharge Instructions (Signed)
Dental Pain °A tooth ache may be caused by cavities (tooth decay). Cavities expose the nerve of the tooth to air and hot or cold temperatures. It may come from an infection or abscess (also called a boil or furuncle) around your tooth. It is also often caused by dental caries (tooth decay). This causes the pain you are having. °DIAGNOSIS  °Your caregiver can diagnose this problem by exam. °TREATMENT  °· If caused by an infection, it may be treated with medications which kill germs (antibiotics) and pain medications as prescribed by your caregiver. Take medications as directed. °· Only take over-the-counter or prescription medicines for pain, discomfort, or fever as directed by your caregiver. °· Whether the tooth ache today is caused by infection or dental disease, you should see your dentist as soon as possible for further care. °SEEK MEDICAL CARE IF: °The exam and treatment you received today has been provided on an emergency basis only. This is not a substitute for complete medical or dental care. If your problem worsens or new problems (symptoms) appear, and you are unable to meet with your dentist, call or return to this location. °SEEK IMMEDIATE MEDICAL CARE IF:  °· You have a fever. °· You develop redness and swelling of your face, jaw, or neck. °· You are unable to open your mouth. °· You have severe pain uncontrolled by pain medicine. °MAKE SURE YOU:  °· Understand these instructions. °· Will watch your condition. °· Will get help right away if you are not doing well or get worse. °Document Released: 05/21/2005 Document Revised: 08/13/2011 Document Reviewed: 01/07/2008 °ExitCare® Patient Information ©2015 [employer]. This information is not intended to replace advice given to you by your health care provider. Make sure you discuss any questions you have with your health care provider. ° °Emergency Department Resource Guide °1) Find a Doctor and Pay Out of Pocket °Although you won't have to find out who  is covered by your insurance plan, it is a good idea to ask around and get recommendations. You will then need to call the office and see if the doctor you have chosen will accept you as a new patient and what types of options they offer for patients who are self-pay. Some doctors offer discounts or will set up payment plans for their patients who do not have insurance, but you will need to ask so you aren't surprised when you get to your appointment. ° °2) Contact Your Local Health Department °Not all health departments have doctors that can see patients for sick visits, but many do, so it is worth a call to see if yours does. If you don't know where your local health department is, you can check in your phone book. The CDC also has a tool to help you locate your state's health department, and many state websites also have listings of all of their local health departments. ° °3) Find a Walk-in Clinic °If your illness is not likely to be very severe or complicated, you may want to try a walk in clinic. These are popping up all over the country in pharmacies, drugstores, and shopping centers. They're usually staffed by nurse practitioners or physician assistants that have been trained to treat common illnesses and complaints. They're usually fairly quick and inexpensive. However, if you have serious medical issues or chronic medical problems, these are probably not your best option. ° °No Primary Care Doctor: °- Call [facility] at  832-8000 - they can help you locate a primary   care doctor that  accepts your insurance, provides certain services, etc. °- Physician Referral Service- 1-800-533-3463 ° °Chronic Pain Problems: °Organization         Address  Phone   Notes  °[facility]  (336) 297-2271 Patients need to be referred by their primary care doctor.  ° °Medication Assistance: °Organization         Address  Phone   Notes  °[place] [facility] [address]  [address] °[place], [place] [address] (336) 641-8030 --Must be a resident of [place] °-- Must have NO insurance coverage whatsoever (no Medicaid/ Medicare, etc.) °-- The pt. MUST have a primary care doctor that directs their care regularly and follows them in the community °  °[facility]  (866) 331-1348   °[employer]  (888) 892-1162   ° °Agencies that provide inexpensive medical care: °Organization         Address  Phone   Notes  °[facility]  (336) 832-8035   °[facility]    (336) 832-7272   °[facility] [address] °[place], [place] [address] (336) 832-4777   °[facility] [address], °[place] (336) 271-4999   °[facility]    (336) 373-0678   °[facility]    (336) 272-1050   °[facility] ° [address], [place] Phone:  (336) 832-4444, Fax:  (336) 832-4440 Hours of Operation:  9 am - 6 pm, M-F.  Also accepts Medicaid/Medicare and self-pay.  °[facility] ° [address], [place] Phone: (336) 832-3150, Fax: (336) [phone]. Hours of Operation:  8:30 am - 5:30 pm, M-F.  Also accepts Medicaid and self-pay.  °[facility] [place] [address], [place] Phone: (336) 878-6027   °[facility] [address], [place], [place] (336)723-1848, Ext. 123 Mondays & Thursdays: 7-9 AM.  First 15 patients are seen on a first come, first serve basis. °  ° °Medicaid-accepting [place] Providers: ° °Organization         Address  Phone   Notes  °[facility] [address], [place] (336) 641-2100 Also accepts self-pay patients.  °[facility] [address], [place] ° (336) 856-9996   °[facility] [address], [place] (336) 288-8857   °[facility] [address] [place] [address], [place] (336) 299-7000   °Veita Bland [address], [place]  ° (336) 373-1557 Only accepts [place] Access Medicaid patients after they have their name applied to their card.  ° °Self-Pay (no insurance) in [place]: ° °Organization         Address  Phone   Notes  °Sickle Cell Patients, [facility] [address], [place] (336) 832-1970   °[facility] [address], [place] (336) 832-4400   °[facility] ° [address] [place] [address], [place] (336) 992-4800   °[facility]/Dr. Osei-Bonsu ° [address] [place] [address], [place] or [address], [place] (336) [phone] Phone number for both [place] and [place] locations is the same.  °[facility] [address], [place] (336) 299-0000   °[facility]  [address] [place] [address],  or [address] (336) 852-7530 °(336) 878-2260   °  [facility] [address], [place] (336) 350-1642, phone; (336) 294-5005, fax Sees patients 1st and 3rd Saturday of every month.  Must not qualify for public or private insurance (i.e. Medicaid, Medicare, [place] Health Choice, Veterans' Benefits) • Household income should be no more than 200% of the poverty level •The clinic cannot treat you if you are pregnant or think you are pregnant • Sexually transmitted diseases are not treated at the clinic.  ° ° °Dental Care: °Organization         Address  Phone  Notes  °[facility] [address], [place] (336) 641-6152 Accepts children up to age 21 who are enrolled in Medicaid or [place] Health Choice; pregnant women with a Medicaid card; and children who have applied for Medicaid or [place] Health Choice, but were declined, whose parents can pay a reduced fee at time of service.  °[facility] [place]  [address], [place] (336) 641-7733 Accepts children up to age 21 who are  enrolled in Medicaid or [place] Health Choice; pregnant women with a Medicaid card; and children who have applied for Medicaid or [place] Health Choice, but were declined, whose parents can pay a reduced fee at time of service.  °[facility] ° [address], [place] (336) 641-4533 Patients are seen by appointment only. Walk-ins are not accepted. [facility] will see patients 18 years of age and older. °Monday - Tuesday (8am-5pm) °Most Wednesdays (8:30-5pm) °$30 per visit, cash only  °[facility] ° [address], [place] (336) 641-4533 Patients are seen by appointment only. Walk-ins are not accepted. [facility] will see patients 18 years of age and older. °One Wednesday Evening (Monthly: Volunteer Based).  $30 per visit, cash only  °[facility]  (919) 537-3737 for adults; Children under age 4, call Graduate Pediatric Dentistry at ([phone]) [phone]. Children aged 4-14, please call (919) [phone] to request a pediatric application. ° Dental services are provided in all areas of dental care including fillings, crowns and bridges, complete and partial dentures, implants, gum treatment, root canals, and extractions. Preventive care is also provided. Treatment is provided to both adults and children. °Patients are selected via a lottery and there is often a waiting list. °  °[facility] [address], °[place] ° (336) 763-8833 [email or web address] °  °[facility] [address], [place], [place] (336)723-1848, Ext. 123 Second and Fourth Thursday of each month, opens at 6:30 AM; Clinic ends at 9 AM.  Patients are seen on a first-come first-served basis, and a limited number are seen during each clinic.  ° °[facility] ° [address], [place], [place] (336) 723-7904   Eligibility Requirements °You must have lived in [place], [place], or [place] counties for at least the last three months. °  You  cannot be eligible for state or federal sponsored healthcare insurance, including Veterans Administration, Medicaid, or Medicare. °  You generally cannot be eligible for healthcare insurance through your employer.  °  How to apply: °Eligibility screenings are held every Tuesday and Wednesday afternoon from 1:00 pm until 4:00 pm. You do not need an appointment for the interview!  °[facility] [address], [place], [place] 336-631-2330   °[facility] [facility]  336-342-8273   °[facility]  336-703-3100   °[facility]  336-570-6415   ° °Behavioral Health Resources in the Community: °Intensive Outpatient Programs °Organization         Address  Phone  Notes  °[facility] [address], [place], [place] 336-878-6098   °[facility] [address], [place], [place] 336-832-9800   °[facility] [address], [place], [place] ° 336-882-2125   °[facility] [address],  °[place], [place] 1-800-853-5163 or 336-641-4981   °Substance Abuse Resources °Organization         Address  Phone  Notes  °[facility]  336-882-2125   °[facility]  336-784-9470   °[facility]  336-285-9073   °[facility]  336-845-3988   °Residential & Outpatient Substance Abuse Program  1-800-659-3381   °Psychological Services °Organization         Address  Phone  Notes  °[facility]  336- 832-9600   °[facility]  336- 378-7881   °[facility] [address], [place] 1-800-853-5163 or 336-641-4981   ° °Mobile Crisis Teams °Organization         Address  Phone  Notes  °[facility], Mobile Crisis Care Unit  1-877-626-1772   °Assertive °[facility] ° [address] [place], [place] 336-834-9664   °Sharon DeEsch [address] °[place] 336-554-5454   ° °Self-Help/Support  Groups °Organization         Address  Phone             Notes  °[facility] [place] - variety of support groups  336- 373-1402 Call for more information  °Narcotics Anonymous (NA), [facility] [address], °[place]  2 meetings at this location  ° °Residential Treatment Programs °Organization         Address  Phone  Notes  °[facility] [address],    °[place]  1-866-801-8205   °[facility] ° [address], [place], [place] 704-293-8524   °[facility] [address], [place] 336-845-3988 Admissions: 8am-3pm M-F  °[facility] [address]    °[place], [place] 336-841-1104   °[facility] [address], [place], [place] 336-379-7146   °[facility] [address]  °[place], [place] 336-285-9073   °[facility] Intensive Outpatient [address], [place], [place] 336-852-3033   °[facility] [address]  °[place], [place] 1-877-615-2722 or 336-784-9470   °[facility] [address] [place], [place] 336-227-7417 Accepts Medicaid  °[facility] [address]  ° [place] 1-800-659-3381 Substance Abuse/Addiction Treatment  ° °[place] Behavioral Health Resources °Organization         Address  Phone  Notes  °[facility]  (888) 581-9988   °Julie Brannon, PhD [address] [place], [place]   (336) 349-5553 or (336) 951-0000   °[facility]   [address] °[place], [place] (336) 349-4454   °[facility] [address], [place],  (336) 342-8316 Insurance/Medicaid/sponsorship through [facility]  °[facility] [address]                                    [place],  (336) 342-8316 Therapy/tele-psych/case  °[facility]   [address]  ° [place], [place] (336) 349-2233    °Dr. Arfeen  (336) 349-4544   °[facility] [place]  [employer] [place] [facility] 1) [address], [place] °2) [address], [place] °3)  [address], [place] (336) 349-3220 °(336) 342-7768 ° °(336) 342-8140   °[place] Child Abuse [facility] (336) 342-1394 or (336) 342-3537 (After Hours)    ° ° ° °

## 2014-11-07 NOTE — ED Provider Notes (Signed)
CSN: [id number]     Arrival date & time 11/07/14  1712 History  This chart was scribed for non-physician practitioner, Ladona Mow, PA-C,working with Mancel Bale, MD, by Karle Plumber, ED Scribe. This patient was seen in room TR06C/TR06C and the patient's care was started at 6:34 PM.  Chief Complaint  Patient presents with  . Dental Pain   The history is provided by the patient and medical records. No language interpreter was used.    HPI Comments:  Sarah Garrison is a 34 y.o. obese female who presents to the Emergency Department complaining of severe dental pain that began five days ago secondary to a wisdom tooth extraction. She reports associated fever Tmax 100 degrees earlier today. She states she was prescribed Vicodin and Ibuprofen for the pain but it is still hurting. She reports the last time she took a Vicodin yesterday and states they do not work. She states she has called the dentist office several times but they have not returned her call. She denies modifying factors. Denies facial swelling, drainage, nausea or vomiting.  Past Medical History  Diagnosis Date  . Asthma   . GERD (gastroesophageal reflux disease)    Past Surgical History  Procedure Laterality Date  . Cesarean section    . Tubal ligation     Family History  Problem Relation Age of Onset  . Asthma Mother   . Asthma Father    History  Substance Use Topics  . Smoking status: Current Every Day Smoker -- 0.25 packs/day    Types: Cigarettes  . Smokeless tobacco: Never Used  . Alcohol Use: No   OB History    Gravida Para Term Preterm AB TAB SAB Ectopic Multiple Living   Review of Systems  Constitutional: Positive for fever.  HENT: Positive for dental problem. Negative for facial swelling.   Gastrointestinal: Negative for nausea and vomiting.    Allergies  Review of patient's allergies indicates no known allergies.  Home Medications   Prior to Admission medications    Medication Sig Start Date End Date Taking? Authorizing Provider  neomycin-polymyxin-hydrocortisone (CORTISPORIN) 3.5-10000-1 otic suspension Place 4 drops into the left ear 4 (four) times daily. X 7 days 05/20/14   Mercedes Camprubi-Soms, PA-C  penicillin v potassium (VEETID) 500 MG tablet Take 1 tablet (500 mg total) by mouth 4 (four) times daily. 11/07/14 11/14/14  Ladona Mow, PA-C   Triage Vitals: BP 146/93 mmHg  Pulse 92  Temp(Src) 98.7 F (37.1 C) (Oral)  Resp 18  Ht  (1.651 m)  Wt 243 lb (110.224 kg)  BMI 40.44 kg/m2  SpO2 98%  LMP 10/24/2014 Physical Exam  Constitutional: She is oriented to person, place, and time. She appears well-developed and well-nourished.  HENT:  Head: Normocephalic and atraumatic.  Mouth/Throat: Uvula is midline, oropharynx is clear and moist and mucous membranes are normal. No trismus in the jaw. No dental abscesses, uvula swelling or dental caries. No oropharyngeal exudate, posterior oropharyngeal edema, posterior oropharyngeal erythema or tonsillar abscesses.  Bilaterally patient has surgical wounds to previous location of wisdom teeth on the lower jaw. Hemostasis is achieved. There is no surrounding erythema, swelling, warmth, gross abscess. No surrounding cellulitis. Floor of mouth is soft. Gumline intact. No cervical lymphadenopathy or cellulitic regions.  Eyes: EOM are normal.  Neck: Normal range of motion.  Cardiovascular: Normal rate.   Pulmonary/Chest: Effort normal.  Musculoskeletal: Normal range of motion.  Neurological:  She is alert and oriented to person, place, and time.  Skin: Skin is warm and dry.  Psychiatric: She has a normal mood and affect. Her behavior is normal.  Nursing note and vitals reviewed.   ED Course  Procedures (including critical care time) DIAGNOSTIC STUDIES: Oxygen Saturation is 98% on RA, normal by my interpretation.   COORDINATION OF CARE: 6:38 PM- Will order urine pregnancy test and Toradol. Advised pt to  continue calling the dentist and taking the prescribed pain medications. Pt verbalizes understanding and agrees to plan.  Medications  ketorolac (TORADOL) injection 60 mg (60 mg Intramuscular Given 11/07/14 1913)    Labs Review Labs Reviewed  POC URINE PREG, ED    Imaging Review No results found.   EKG Interpretation None      MDM   Final diagnoses:  Pain, dental    Patient withpostoperative pain status post was into the extraction earlier in the week. Patient with toothache.  Patient afebrile, hemodynamically stable, well-appearing and in no acute distress. No gross abscess.  Exam unconcerning for Ludwig's angina or spread of infection.  Will treat with penicillin and pain medicine.  Urged patient to follow-up with dentist.  Discussed return precautions with patient, patient verbalizes understanding and agreement of this plan.   I personally performed the services described in this documentation, which was scribed in my presence. The recorded information has been reviewed and is accurate.  BP 126/77 mmHg  Pulse 89  Temp(Src) 98.1 F (36.7 C) (Oral)  Resp 16  Ht 5\' 5"  (1.651 m)  Wt 243 lb (110.224 kg)  BMI 40.44 kg/m2  SpO2 100%  LMP 10/24/2014  Signed,  Ladona MowJoe Moksha Dorgan, PA-C 3:50 AM   Ladona MowJoe Garry Nicolini, PA-C 11/08/14 [id number]  Mancel BaleElliott Wentz, MD 11/08/14 1020

## 2015-01-05 ENCOUNTER — Encounter (HOSPITAL_COMMUNITY): Payer: Self-pay | Admitting: *Deleted

## 2015-01-05 ENCOUNTER — Emergency Department (HOSPITAL_COMMUNITY)
Admission: EM | Admit: 2015-01-05 | Discharge: 2015-01-05 | Disposition: A | Discharge status: Home / Self Care | Payer: Medicaid Other | Attending: Emergency Medicine | Admitting: Emergency Medicine

## 2015-01-05 DIAGNOSIS — Z8719 Personal history of other diseases of the digestive system: Secondary | ICD-10-CM | POA: Diagnosis not present

## 2015-01-05 DIAGNOSIS — J45909 Unspecified asthma, uncomplicated: Secondary | ICD-10-CM | POA: Insufficient documentation

## 2015-01-05 DIAGNOSIS — Z720 Tobacco use: Secondary | ICD-10-CM | POA: Insufficient documentation

## 2015-01-05 DIAGNOSIS — H9201 Otalgia, right ear: Secondary | ICD-10-CM | POA: Diagnosis present

## 2015-01-05 DIAGNOSIS — H66001 Acute suppurative otitis media without spontaneous rupture of ear drum, right ear: Secondary | ICD-10-CM | POA: Diagnosis not present

## 2015-01-05 MED ORDER — AMOXICILLIN 500 MG PO CAPS: 500.0000 mg | ORAL_CAPSULE | Freq: Three times a day (TID) | ORAL | Status: DC

## 2015-01-05 NOTE — ED Provider Notes (Signed)
CSN: [id number]     Arrival date & time 01/05/15  4098 History  This chart was scribed for non-physician practitioner, Langston Masker, PA-C, working with Blane Ohara, MD by Charline Bills, ED Scribe. This patient was seen in room TR07C/TR07C and the patient's care was started at 10:16 AM.   Chief Complaint  Patient presents with  . Otalgia   The history is provided by the patient. No language interpreter was used.   HPI Comments: Sarah Garrison is a 34 y.o. female who presents to the Emergency Department complaining of constant right ear pain for the past 3 days. Pt reports previous infections in the same ear. She has been treating with penicillin that was previously prescribed for dental pain. She denies fever and chills. No known medical allergies. Pt quit smoking 2 months ago.   Past Medical History  Diagnosis Date  . Asthma   . GERD (gastroesophageal reflux disease)    Past Surgical History  Procedure Laterality Date  . Cesarean section    . Tubal ligation     Family History  Problem Relation Age of Onset  . Asthma Mother   . Asthma Father    History  Substance Use Topics  . Smoking status: Current Every Day Smoker -- 0.25 packs/day    Types: Cigarettes  . Smokeless tobacco: Never Used  . Alcohol Use: No   OB History    Gravida Para Term Preterm AB TAB SAB Ectopic Multiple Living   Review of Systems  Constitutional: Negative for fever and chills.  HENT: Positive for ear pain.    Allergies  Review of patient's allergies indicates no known allergies.  Home Medications   Prior to Admission medications   Medication Sig Start Date End Date Taking? Authorizing Provider  neomycin-polymyxin-hydrocortisone (CORTISPORIN) 3.5-10000-1 otic suspension Place 4 drops into the left ear 4 (four) times daily. X 7 days 05/20/14   Mercedes Camprubi-Soms, PA-C   BP 132/88 mmHg  Pulse 98  Temp(Src) 98.6 F (37 C) (Oral)  Resp 16  Ht  (1.651 m)  Wt 240  lb (108.863 kg)  BMI 39.94 kg/m2  SpO2 100%  LMP 12/29/2014 Physical Exam  Constitutional: She is oriented to person, place, and time. She appears well-developed and well-nourished. No distress.  HENT:  Head: Normocephalic and atraumatic.  Right Ear: Tympanic membrane is erythematous.  Left Ear: Tympanic membrane normal.  R TM: erythematous. Poor landmarks.   Eyes: Conjunctivae and EOM are normal.  Neck: Neck supple. No tracheal deviation present.  Cardiovascular: Normal rate.   Pulmonary/Chest: Effort normal. No respiratory distress.  Musculoskeletal: Normal range of motion.  Neurological: She is alert and oriented to person, place, and time.  Skin: Skin is warm and dry.  Psychiatric: She has a normal mood and affect. Her behavior is normal.  Nursing note and vitals reviewed.  ED Course  Procedures (including critical care time) DIAGNOSTIC STUDIES: Oxygen Saturation is 100% on RA, normal by my interpretation.    COORDINATION OF CARE: 10:19 AM-Discussed treatment plan which includes Amoxil with pt at bedside and pt agreed to plan.   Labs Review Labs Reviewed - No data to display  Imaging Review No results found.   EKG Interpretation None      MDM   Final diagnoses:  External otitis, right       Elson Areas, PA-C 01/05/15 1626  Blane Ohara, MD 01/05/15 [phone]

## 2015-01-05 NOTE — Discharge Instructions (Signed)

## 2015-01-05 NOTE — ED Notes (Signed)
PT reports a $ day HX of RT ear pain and has been taking left over PCN  That was given for dental problems.

## 2015-01-05 NOTE — ED Notes (Signed)
Declined W/C at D/C and was escorted to lobby by RN. 

## 2015-04-08 ENCOUNTER — Emergency Department (HOSPITAL_COMMUNITY)
Admission: EM | Admit: 2015-04-08 | Discharge: 2015-04-08 | Disposition: A | Discharge status: Home / Self Care | Payer: Medicaid Other | Attending: Physician Assistant | Admitting: Physician Assistant

## 2015-04-08 ENCOUNTER — Encounter (HOSPITAL_COMMUNITY): Payer: Self-pay

## 2015-04-08 DIAGNOSIS — G5602 Carpal tunnel syndrome, left upper limb: Secondary | ICD-10-CM | POA: Diagnosis not present

## 2015-04-08 DIAGNOSIS — M792 Neuralgia and neuritis, unspecified: Secondary | ICD-10-CM

## 2015-04-08 DIAGNOSIS — R200 Anesthesia of skin: Secondary | ICD-10-CM | POA: Diagnosis present

## 2015-04-08 DIAGNOSIS — J45909 Unspecified asthma, uncomplicated: Secondary | ICD-10-CM | POA: Diagnosis not present

## 2015-04-08 DIAGNOSIS — Z720 Tobacco use: Secondary | ICD-10-CM | POA: Insufficient documentation

## 2015-04-08 DIAGNOSIS — M79671 Pain in right foot: Secondary | ICD-10-CM | POA: Insufficient documentation

## 2015-04-08 DIAGNOSIS — Z8719 Personal history of other diseases of the digestive system: Secondary | ICD-10-CM | POA: Insufficient documentation

## 2015-04-08 DIAGNOSIS — M79672 Pain in left foot: Secondary | ICD-10-CM | POA: Insufficient documentation

## 2015-04-08 NOTE — ED Notes (Signed)
Pt reports her toes on bilateral feet are numb and tingling, ongoing since October 4th, and tingling to left hand (only three fingers) onset 2 days ago. Pt A&Ox4, ambulatory with steady gait, speaking clear complete sentences. No neuro deficits noted.

## 2015-04-08 NOTE — ED Notes (Signed)
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

## 2015-04-08 NOTE — ED Provider Notes (Signed)
Arrival Date & Time: 04/08/15 & 2004 History   Chief Complaint  Patient presents with  . Numbness   HPI Sarah Garrison is a 34 y.o. female who presents with endorsement of numbness of LUE with sleeping. 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 Associated numbness of first second and third digits. No weakness. Strength normal per patient. Wakes her up several times during night. Recently had to switch shoes to larger size due to numbness in medial large toes bilaterally. Better now with larger steel toed shoe and not wearing shoes. Worse with smaller shoes.   Past Medical History  I reviewed & agree with nursing's documentation on PMHx, PSHx, SHx and FHx. Past Medical History  Diagnosis Date  . Asthma   . GERD (gastroesophageal reflux disease)    Past Surgical History  Procedure Laterality Date  . Cesarean section    . Tubal ligation     Social History   Social History  . Marital Status: Single    Spouse Name: N/A  . Number of Children: N/A  . Years of Education: N/A   Social History Main Topics  . Smoking status: Current Every Day Smoker -- 0.25 packs/day    Types: Cigarettes  . Smokeless tobacco: Never Used  . Alcohol Use: No  . Drug Use: No  . Sexual Activity: Yes    Birth Control/ Protection: Surgical   Other Topics Concern  . None   Social History Narrative   Family History  Problem Relation Age of Onset  . Asthma Mother   . Asthma Father     Review of Systems   Complete ROS performed by me, all positives & negatives documented as above in HPI. All other ROS negative.  Allergies  Review of patient's allergies indicates no known allergies.  Home Medications   Prior to Admission medications   Medication Sig Start Date End Date Taking? Authorizing Provider  amoxicillin (AMOXIL) 500 MG capsule Take 1 capsule (500 mg total) by mouth 3 (three) times daily. Patient not taking: Reported on 04/08/2015 01/05/15    Elson AreasLeslie K Sofia, PA-C  neomycin-polymyxin-hydrocortisone (CORTISPORIN) 3.5-10000-1 otic suspension Place 4 drops into the left ear 4 (four) times daily. X 7 days Patient not taking: Reported on 04/08/2015 05/20/14   Mercedes Camprubi-Soms, PA-C    Physical Exam  BP 127/76 mmHg  Pulse 80  Temp(Src) 98.1 F (36.7 C) (Oral)  Resp 15  Ht 5\' 5"  (1.651 m)  Wt 210 lb (95.255 kg)  BMI 34.95 kg/m2  SpO2 96%  LMP 03/11/2015 Physical Exam  Constitutional: She is oriented to person, place, and time. She appears well-developed and well-nourished.  Non-toxic appearance. She does not appear ill. No distress.  HENT:  Head: Normocephalic and atraumatic.  Right Ear: External ear normal.  Left Ear: External ear normal.  Eyes: Pupils are equal, round, and reactive to light. No scleral icterus.  Neck: Normal range of motion. Neck supple. No tracheal deviation present.  Cardiovascular: Normal heart sounds and intact distal pulses.   No murmur heard. Pulmonary/Chest: Effort normal and breath sounds normal. No stridor. No respiratory distress. She has no wheezes. She has no rales.  Abdominal: Soft. Bowel sounds are normal. She exhibits no distension. There is no tenderness. There is no rebound and no guarding.  Musculoskeletal: Normal range of motion.  Neurological: She is alert and oriented to person, place, and time. She has normal strength and normal reflexes. A sensory deficit (  paresthesias developed over first second and third digits after wrists held in flexion for 1 minute.) is present. No cranial nerve deficit. Coordination and gait normal.  Skin: Skin is warm and dry. No pallor.  Psychiatric: She has a normal mood and affect. Her behavior is normal.  Nursing note and vitals reviewed.  ED Course  Procedures Labs Review Labs Reviewed - No data to display  Imaging Review No results found.  Laboratory and Imaging results were personally reviewed by myself and used in the medical decision making  of this patient's treatment and disposition.  EKG Interpretation  EKG Interpretation  Date/Time:    Ventricular Rate:    PR Interval:    QRS Duration:   QT Interval:    QTC Calculation:   R Axis:     Text Interpretation:        MDM  Sarah Garrison is a 34 y.o. female with H&P as above. ED clinical course as follows:  Patient's mental status is appropriate. No meningismus. Cranial nerves remain grossly intact. Strength and sensation in upper and lower extremities symmetric and at baseline. Coordination, tone and gait intact.  Does not fit neurologic distribution that would suggest Intracranial abnormality. HPI consistent with carpal tunnel and in correct distribution. Recs given to prevent symptoms and told to obtain wrist for sleeping. And that if symptoms do not improve to ask for referral from PCP to consider surgery. Feet without infection signs. Told to seek evaluation from Podiatry.  Disposition: I explained the differential diagnoses along with likely diagnosis and have given explicit precautions to return to the ER including any other new or worsening symptoms. The patient understands, and I both nursing and I confirmed there are no further concerns or questions. Discharge instructions concerning symptomatic care and follow up have been given. Patient requires wrist brace upon discharge. The patient is stable and is discharged to home in good condition.   Clinical Impression:  1. Carpal tunnel syndrome of left wrist   2. Neuropathic pain of foot, left   3. Neuropathic pain of foot, right    Patient care discussed with Dr. Corlis Leak, who oversaw their evaluation & treatment & voiced agreement. House Officer: Jonette Eva, MD, Emergency Medicine.  Jonette Eva, MD 04/11/15 0132  Abelino Derrick, MD 04/14/15 1610

## 2015-04-08 NOTE — ED Notes (Signed)
Doctor at bedside.

## 2015-04-08 NOTE — Discharge Instructions (Signed)
Carpal Tunnel Syndrome Carpal tunnel syndrome is a condition that causes pain in your hand and arm. The carpal tunnel is a narrow area located on the palm side of your wrist. Repeated wrist motion or certain diseases may cause swelling within the tunnel. This swelling pinches the main nerve in the wrist (median nerve). CAUSES  This condition may be caused by:   Repeated wrist motions.  Wrist injuries.  Arthritis.  A cyst or tumor in the carpal tunnel.  Fluid buildup during pregnancy. Sometimes the cause of this condition is not known.  RISK FACTORS This condition is more likely to develop in:   People who have jobs that cause them to repeatedly move their wrists in the same motion, such as butchers and cashiers.  Women.  People with certain conditions, such as:  Diabetes.  Obesity.  An underactive thyroid (hypothyroidism).  Kidney failure. SYMPTOMS  Symptoms of this condition include:   A tingling feeling in your fingers, especially in your thumb, index, and middle fingers.  Tingling or numbness in your hand.  An aching feeling in your entire arm, especially when your wrist and elbow are bent for long periods of time.  Wrist pain that goes up your arm to your shoulder.  Pain that goes down into your palm or fingers.  A weak feeling in your hands. You may have trouble grabbing and holding items. Your symptoms may feel worse during the night.  DIAGNOSIS  This condition is diagnosed with a medical history and physical exam. You may also have tests, including:   An electromyogram (EMG). This test measures electrical signals sent by your nerves into the muscles.  X-rays. TREATMENT  Treatment for this condition includes:  Lifestyle changes. It is important to stop doing or modify the activity that caused your condition.  Physical or occupational therapy.  Medicines for pain and inflammation. This may include medicine that is injected into your wrist.  A wrist  splint.  Surgery. HOME CARE INSTRUCTIONS  If You Have a Splint:  Wear it as told by your health care provider. Remove it only as told by your health care provider.  Loosen the splint if your fingers become numb and tingle, or if they turn cold and blue.  Keep the splint clean and dry. General Instructions  Take over-the-counter and prescription medicines only as told by your health care provider.  Rest your wrist from any activity that may be causing your pain. If your condition is work related, talk to your employer about changes that can be made, such as getting a wrist pad to use while typing.  If directed, apply ice to the painful area:  Put ice in a plastic bag.  Place a towel between your skin and the bag.  Leave the ice on for 20 minutes, 2-3 times per day.  Keep all follow-up visits as told by your health care provider. This is important.  Do any exercises as told by your health care provider, physical therapist, or occupational therapist. SEEK MEDICAL CARE IF:   You have new symptoms.  Your pain is not controlled with medicines.  Your symptoms get worse.   This information is not intended to replace advice given to you by your health care provider. Make sure you discuss any questions you have with your health care provider.   Document Released: 05/18/2000 Document Revised: 02/09/2015 Document Reviewed: 10/06/2014 Elsevier Interactive Patient Education 2016 [employer]  Carpal Tunnel Release, Care After Refer to this sheet in the next  few weeks. These instructions provide you with information about caring for yourself after your procedure. Your health care provider may also give you more specific instructions. Your treatment has been planned according to current medical practices, but problems sometimes occur. Call your health care provider if you have any problems or questions after your procedure. WHAT TO EXPECT AFTER THE PROCEDURE After your procedure, it is  typical to have the following:  Pain.  Numbness.  Tingling.  Swelling.  Stiffness.  Bruising. HOME CARE INSTRUCTIONS  Take medicines only as directed by your health care provider.  There are many different ways to close and cover an incision, including stitches (sutures), skin glue, and adhesive strips. Follow your health care provider's instructions about:  Incision care.  Bandage (dressing) changes and removal.  Incision closure removal.  Wear a splint or a brace as directed by your surgeon. You may need to do this for 2-3 weeks.  Keep your hand raised (elevated) above the level of your heart while you are resting. Move your fingers often.  Avoid activities that cause hand pain.  Ask your surgeon when you can start to do all of your usual activities again, such as:  Driving.  Returning to work.  Bathing and swimming.  Keep all follow-up visits as directed by your health care provider. This is important. You may need physical therapy for several months to speed healing and regain movement. SEEK MEDICAL CARE IF:  You have drainage, redness, swelling, or pain at your incision site.  You have a fever.  You have chills.  Your pain medicine is not working.  Your symptoms do not go away after 2 months.  Your symptoms go away and then return. SEEK IMMEDIATE MEDICAL CARE IF:  You have pain or numbness that is getting worse.  Your fingers change color.  You are not able to move your fingers.   This information is not intended to replace advice given to you by your health care provider. Make sure you discuss any questions you have with your health care provider.   Document Released: 12/08/2004 Document Revised: 06/11/2014 Document Reviewed: 01/06/2014 Elsevier Interactive Patient Education [employer] Inc[employer]

## 2015-04-10 NOTE — ED Provider Notes (Signed)
I saw and evaluated the patient, reviewed the resident's note and I agree with the findings and plan.   EKG Interpretation None      Patient presents with intermittent  numbness to her left hand. Patient uses hands at work. Distribution and symtpoms consistent with  repetitive use injury.Sensation circulation and strength are intact.We'll have her wear brace at night and follow-up with primary care physician for the referral for carpal tunnel as needed if brace does not alleviate symtpoms.   Yenni Carra Randall AnLyn Nimisha Rathel, MD 04/10/15 2326

## 2015-06-14 ENCOUNTER — Emergency Department (HOSPITAL_COMMUNITY)
Admission: EM | Admit: 2015-06-14 | Discharge: 2015-06-14 | Disposition: A | Discharge status: Home / Self Care | Payer: Medicaid Other | Attending: Emergency Medicine | Admitting: Emergency Medicine

## 2015-06-14 ENCOUNTER — Encounter (HOSPITAL_COMMUNITY): Payer: Self-pay

## 2015-06-14 DIAGNOSIS — M79674 Pain in right toe(s): Secondary | ICD-10-CM

## 2015-06-14 DIAGNOSIS — M79642 Pain in left hand: Secondary | ICD-10-CM | POA: Diagnosis not present

## 2015-06-14 DIAGNOSIS — Z8719 Personal history of other diseases of the digestive system: Secondary | ICD-10-CM | POA: Insufficient documentation

## 2015-06-14 DIAGNOSIS — R202 Paresthesia of skin: Secondary | ICD-10-CM | POA: Diagnosis present

## 2015-06-14 DIAGNOSIS — J45909 Unspecified asthma, uncomplicated: Secondary | ICD-10-CM | POA: Insufficient documentation

## 2015-06-14 DIAGNOSIS — M79645 Pain in left finger(s): Secondary | ICD-10-CM | POA: Insufficient documentation

## 2015-06-14 DIAGNOSIS — F17210 Nicotine dependence, cigarettes, uncomplicated: Secondary | ICD-10-CM | POA: Diagnosis not present

## 2015-06-14 NOTE — ED Provider Notes (Signed)
CSN: [id number]     Arrival date & time 06/14/15  1951 History  By signing my name below, I, Tanda Rockers, attest that this documentation has been prepared under the direction and in the presence of [employer], PA-C. Electronically Signed: Tanda Rockers, ED Scribe. 06/14/2015. 9:36 PM.   Chief Complaint  Patient presents with  . Tingling   The history is provided by the patient. No language interpreter was used.     HPI Comments: Sarah Garrison is a 35 y.o. AA female who presents to the Emergency Department complaining of gradual onset, intermittent, paresthesias and pain to 1st-3rd digits on left hand. Pt reports that the pain starts in the 3rd digit and radiates into the 2nd and 1st finger. She also complains of the same fingers turning green/blue/grey in color and swelling. The color change is not dependent on being out in the cold weather and lasts for hours before subsiding on its own. Pt reports that she has pain and color change to her great toe on the right foot but this is constant. She was seen in the ED on 04/08/2015 (2 months ago) for same symptoms and diagnosed with carpal tunnel syndrome. At that time she had pain in her bilateral great toes but pain has since resolved in her left great toe. Pt was given a wrist splint to wear but she reports it has not been giving her any relief. Pt works with her hands often at work and was told to come to the ED for further evaluation from her job to be cleared to work. Denies vision changes, weakness, numbness, chest pain, shortness of breath, or any other associated symptoms. No IV drug abuse.   Past Medical History  Diagnosis Date  . Asthma   . GERD (gastroesophageal reflux disease)    Past Surgical History  Procedure Laterality Date  . Cesarean section    . Tubal ligation     Family History  Problem Relation Age of Onset  . Asthma Mother   . Asthma Father    Social History  Substance Use Topics  . Smoking status: Current  Every Day Smoker -- 0.25 packs/day    Types: Cigarettes  . Smokeless tobacco: Never Used  . Alcohol Use: No   OB History    Gravida Para Term Preterm AB TAB SAB Ectopic Multiple Living   5 4   1  1   4      Review of Systems  Constitutional: Negative for fever.  HENT: Negative for rhinorrhea and sore throat.   Eyes: Negative for redness and visual disturbance.  Respiratory: Negative for cough and shortness of breath.   Cardiovascular: Negative for chest pain.  Gastrointestinal: Negative for nausea, vomiting, abdominal pain and diarrhea.  Genitourinary: Negative for dysuria.  Musculoskeletal: Positive for joint swelling and arthralgias (1st, 2nd, and 3rd digits on left hand. Right great toe. ). Negative for myalgias.  Skin: Positive for color change. Negative for rash.  Neurological: Negative for weakness, numbness and headaches.       + Paresthesias to 1st, 2nd, and 3rd digits on left hand.    Allergies  Review of patient's allergies indicates no known allergies.  Home Medications   Prior to Admission medications   Medication Sig Start Date End Date Taking? Authorizing Provider  amoxicillin (AMOXIL) 500 MG capsule Take 1 capsule (500 mg total) by mouth 3 (three) times daily. Patient not taking: Reported on 04/08/2015 01/05/15   Elson Areas, PA-C  neomycin-polymyxin-hydrocortisone (CORTISPORIN) 3.5-10000-1  otic suspension Place 4 drops into the left ear 4 (four) times daily. X 7 days Patient not taking: Reported on 04/08/2015 05/20/14   Mercedes Camprubi-Soms, PA-C   Triage Vitals:  BP 151/97 mmHg  Pulse 114  Temp(Src) 98.1 F (36.7 C) (Oral)  Resp 18  Ht 5\' 4"  (1.626 m)  Wt 252 lb 5 oz (114.448 kg)  BMI 43.29 kg/m2  SpO2 96%   Physical Exam  Constitutional: She appears well-developed and well-nourished. No distress.  HENT:  Head: Normocephalic and atraumatic.  Eyes: Conjunctivae and EOM are normal. Right eye exhibits no discharge. Left eye exhibits no discharge.   Neck: Normal range of motion. Neck supple. No tracheal deviation present.  Cardiovascular: Normal rate, regular rhythm and normal heart sounds.   Pulses:      Radial pulses are 2+ on the right side, and 2+ on the left side.       Dorsalis pedis pulses are 2+ on the left side.       Posterior tibial pulses are 2+ on the right side, and 2+ on the left side.  Pulmonary/Chest: Effort normal and breath sounds normal. No respiratory distress.  Abdominal: Soft. There is no tenderness.  Musculoskeletal: Normal range of motion.       Left shoulder: Normal.       Left elbow: Normal.       Left wrist: Normal.       Right ankle: Normal.       Left upper arm: Normal.       Left forearm: Normal.       Left hand: She exhibits tenderness. She exhibits normal range of motion, normal capillary refill, no deformity and no swelling. Normal sensation noted. Normal strength noted.       Right foot: There is tenderness (R toe). There is normal range of motion, no bony tenderness, no swelling and normal capillary refill.  Neurological: She is alert.  Skin: Skin is warm and dry.  Psychiatric: She has a normal mood and affect. Her behavior is normal.  Nursing note and vitals reviewed.   ED Course  Procedures (including critical care time)  DIAGNOSTIC STUDIES: Oxygen Saturation is 96% on RA, normal by my interpretation.    COORDINATION OF CARE: 9:32 PM-Discussed treatment plan which includes referral to PCP and hand specialists with pt at bedside and pt agreed to plan.   Labs Review Labs Reviewed - No data to display  Imaging Review No results found.   EKG Interpretation None      Vital signs reviewed and are as follows: Filed Vitals:   06/14/15 1959 06/14/15 2149  BP: 151/97 121/83  Pulse: 114 102  Temp: 98.1 F (36.7 C) 98.1 F (36.7 C)  Resp: 18 18   Encourage patient to return with worsening symptoms, persistent prolonged symptoms, weakness or trouble moving her digits, or any other  concerns.  MDM   Final diagnoses:  Pain of left hand  Toe pain, right   Left hand pain: Symptoms most consistent with carpal tunnel syndrome symptoms seem to be in a pure median nerve distribution of the left hand. I am uncertain however if this can explain the color change that the patient reports. She does not have any exacerbating symptoms suggestive of Raynaud's phenomenon, although this would be another possibility. Do not suspect any emergent conditions including emboli from distant source.   Pain: Toe appears normal to me. No imaging indicated.  I personally performed the services described in this documentation, which  was scribed in my presence. The recorded information has been reviewed and is accurate.      Renne CriglerJoshua Demarian Epps, PA-C 06/14/15 2202  Loren Raceravid Yelverton, MD 06/14/15 2312

## 2015-06-14 NOTE — ED Notes (Signed)
Pt here with c/o fingers on left hand "turning blue, tingling, swelling and pain;" which has been going on a month and a half. She was seen here for the same and told to wear a brace. She is also reporting a grayish color her greater toe on her left foot. + pulses and cap refill <2 seconds.

## 2015-06-14 NOTE — Discharge Instructions (Signed)
Please read and follow all provided instructions.  Your diagnoses today include:  1. Pain of left hand   2. Toe pain, right     Tests performed today include:  Vital signs. See below for your results today.   Medications prescribed:   None  Home care instructions:  Follow any educational materials contained in this packet.  Follow-up instructions: Please follow-up with the primary care referral to establish primary care and see the hand doctor listed to help figure out if your hand symptoms could be from carpal tunnel syndrome.   Return instructions:   Please return to the Emergency Department if you experience worsening symptoms.   Please return if you have any other emergent concerns.  Additional Information:  Your vital signs today were: BP 151/97 mmHg   Pulse 114   Temp(Src) 98.1 F (36.7 C) (Oral)   Resp 18   Ht 5\' 4"  (1.626 m)   Wt 114.448 kg   BMI 43.29 kg/m2   SpO2 96% If your blood pressure (BP) was elevated above 135/85 this visit, please have this repeated by your doctor within one month. ---------------

## 2015-07-05 ENCOUNTER — Emergency Department (HOSPITAL_COMMUNITY)
Admission: EM | Admit: 2015-07-05 | Discharge: 2015-07-06 | Disposition: A | Discharge status: Home / Self Care | Payer: Medicaid Other | Attending: Emergency Medicine | Admitting: Emergency Medicine

## 2015-07-05 ENCOUNTER — Encounter (HOSPITAL_COMMUNITY): Payer: Self-pay | Admitting: Emergency Medicine

## 2015-07-05 DIAGNOSIS — K2971 Gastritis, unspecified, with bleeding: Secondary | ICD-10-CM | POA: Insufficient documentation

## 2015-07-05 DIAGNOSIS — F17210 Nicotine dependence, cigarettes, uncomplicated: Secondary | ICD-10-CM | POA: Insufficient documentation

## 2015-07-05 DIAGNOSIS — K920 Hematemesis: Secondary | ICD-10-CM | POA: Diagnosis present

## 2015-07-05 DIAGNOSIS — K2970 Gastritis, unspecified, without bleeding: Secondary | ICD-10-CM

## 2015-07-05 DIAGNOSIS — Z3202 Encounter for pregnancy test, result negative: Secondary | ICD-10-CM | POA: Diagnosis not present

## 2015-07-05 DIAGNOSIS — E669 Obesity, unspecified: Secondary | ICD-10-CM | POA: Diagnosis not present

## 2015-07-05 DIAGNOSIS — M545 Low back pain: Secondary | ICD-10-CM | POA: Diagnosis not present

## 2015-07-05 DIAGNOSIS — J45909 Unspecified asthma, uncomplicated: Secondary | ICD-10-CM | POA: Diagnosis not present

## 2015-07-05 LAB — URINALYSIS, ROUTINE W REFLEX MICROSCOPIC
Bilirubin Urine: NEGATIVE
Glucose, UA: >1000 mg/dL — AB
HGB URINE DIPSTICK: NEGATIVE
KETONES UR: NEGATIVE mg/dL
Nitrite: NEGATIVE
PH: 6 (ref 5.0–8.0)
PROTEIN: NEGATIVE mg/dL
Specific Gravity, Urine: 1.019 (ref 1.005–1.030)

## 2015-07-05 LAB — URINE MICROSCOPIC-ADD ON: RBC / HPF: NONE SEEN RBC/hpf (ref 0–5)

## 2015-07-05 LAB — COMPREHENSIVE METABOLIC PANEL
ALT: 20 U/L (ref 14–54)
AST: 18 U/L (ref 15–41)
Albumin: 3.1 g/dL — ABNORMAL LOW (ref 3.5–5.0)
Alkaline Phosphatase: 81 U/L (ref 38–126)
Anion gap: 11 (ref 5–15)
BILIRUBIN TOTAL: 0.5 mg/dL (ref 0.3–1.2)
BUN: 10 mg/dL (ref 6–20)
CO2: 19 mmol/L — ABNORMAL LOW (ref 22–32)
CREATININE: 0.8 mg/dL (ref 0.44–1.00)
Calcium: 8.9 mg/dL (ref 8.9–10.3)
Chloride: 106 mmol/L (ref 101–111)
GFR calc Af Amer: >60 mL/min (ref >60)
GLUCOSE: 235 mg/dL — AB (ref 65–99)
Potassium: 4 mmol/L (ref 3.5–5.1)
Sodium: 136 mmol/L (ref 135–145)
TOTAL PROTEIN: 5.5 g/dL — AB (ref 6.5–8.1)

## 2015-07-05 LAB — TYPE AND SCREEN
ABO/RH(D): O POS
ANTIBODY SCREEN: NEGATIVE

## 2015-07-05 LAB — CBC
HCT: 38.9 % (ref 36.0–46.0)
Hemoglobin: 13.6 g/dL (ref 12.0–15.0)
MCH: 29.7 pg (ref 26.0–34.0)
MCHC: 35 g/dL (ref 30.0–36.0)
MCV: 84.9 fL (ref 78.0–100.0)
PLATELETS: 277 10*3/uL (ref 150–400)
RBC: 4.58 MIL/uL (ref 3.87–5.11)
RDW: 12.6 % (ref 11.5–15.5)
WBC: 6.4 10*3/uL (ref 4.0–10.5)

## 2015-07-05 LAB — POC URINE PREG, ED: Preg Test, Ur: NEGATIVE

## 2015-07-05 LAB — ABO/RH: ABO/RH(D): O POS

## 2015-07-05 MED ORDER — ONDANSETRON 4 MG PO TBDP
4.0000 mg | ORAL_TABLET | Freq: Once | ORAL | Status: AC
  Administered 2015-07-05: 4 mg via ORAL
  Filled 2015-07-05: qty 1

## 2015-07-05 MED ORDER — GI COCKTAIL ~~LOC~~
30.0000 mL | Freq: Once | ORAL | Status: AC
  Administered 2015-07-05: 30 mL via ORAL
  Filled 2015-07-05: qty 30

## 2015-07-05 MED ORDER — PANTOPRAZOLE SODIUM 40 MG PO TBEC
40.0000 mg | DELAYED_RELEASE_TABLET | Freq: Once | ORAL | Status: AC
  Administered 2015-07-05: 40 mg via ORAL
  Filled 2015-07-05: qty 1

## 2015-07-05 NOTE — ED Provider Notes (Signed)
By signing my name below, I, Sarah Garrison, attest that this documentation has been prepared under the direction and in the presence of [employer], DO. Electronically Signed: Soijett Garrison, ED Scribe. 07/06/2015. 12:27 AM.  TIME SEEN: 11:11 PM  CHIEF COMPLAINT: Hematemesis   HPI: Sarah Garrison is a 35 y.o. female with a medical hx of GERD who presents to the Emergency Department complaining of hematemesis onset 7:30 PM PTA. She reports that her vomit was initially clear with streaks of blood. Pt reports that there is nothing that worsens her symptoms and that there are no alleviating factors. Pt reports that she sees an orthopedist for her bilateral carpal tunnel and she recently finished a course of steroids. Pt has a follow up appointment with her orthopedist in the following week.  Denies being on blood thinners or taking excessive amounts of NSAIDs. No history of heavy alcohol use. She notes that she has tried OTC excedrin with no relief of her symptoms.   Pt had a tubal ligation in 2008 and denies any other abdominal surgeries. Denies sick contacts or travel outside of the country. Denies fever, diarrhea, bloody stools, melena, dysuria, hematuria, vaginal bleeding or discharge.  Pt is having associated symptoms of moderate back pain x yesterday. Pt denies recent injury/trauma to her back, but she does stand all day at her occupation at [employer] and [employer]. Denies hx of back surgery or injections to her back.  Denies numbness, tingling or focal weakness. No bowel or bladder incontinence. No urinary retention.   Pt denies having a PCP at this time.     ROS: See HPI Constitutional: no fever  Eyes: no drainage  ENT: no runny nose   Cardiovascular:  no chest pain  Resp: no SOB  GI:  vomiting GU: no dysuria Integumentary: no rash  Allergy: no hives  Musculoskeletal: no leg swelling  Neurological: no slurred speech ROS otherwise negative  PAST MEDICAL HISTORY/PAST SURGICAL HISTORY:   Past Medical History  Diagnosis Date  . Asthma   . GERD (gastroesophageal reflux disease)     MEDICATIONS:  Prior to Admission medications   Medication Sig Start Date End Date Taking? Authorizing Provider  amoxicillin (AMOXIL) 500 MG capsule Take 1 capsule (500 mg total) by mouth 3 (three) times daily. Patient not taking: Reported on 04/08/2015 01/05/15   Elson Areas, PA-C  neomycin-polymyxin-hydrocortisone (CORTISPORIN) 3.5-10000-1 otic suspension Place 4 drops into the left ear 4 (four) times daily. X 7 days Patient not taking: Reported on 04/08/2015 05/20/14   Mercedes Camprubi-Soms, PA-C    ALLERGIES:  No Known Allergies  SOCIAL HISTORY:  Social History  Substance Use Topics  . Smoking status: Current Every Day Smoker -- 0.25 packs/day    Types: Cigarettes  . Smokeless tobacco: Never Used  . Alcohol Use: No    FAMILY HISTORY: Family History  Problem Relation Age of Onset  . Asthma Mother   . Asthma Father     EXAM: BP 131/79 mmHg  Pulse 98  Temp(Src) 98 F (36.7 C) (Oral)  Resp 18  Ht  (1.651 m)  Wt 252 lb (114.306 kg)  BMI 41.93 kg/m2  SpO2 98%  LMP 06/21/2015 (Approximate) CONSTITUTIONAL: Obese female. Alert and oriented and responds appropriately to questions. Well-appearing; well-nourished.  HEAD: Normocephalic EYES: Conjunctivae clear, PERRL ENT: normal nose; no rhinorrhea; moist mucous membranes; pharynx without lesions noted NECK: Supple, no meningismus, no LAD  CARD: RRR; S1 and S2 appreciated; no murmurs, no clicks, no rubs, no  gallops RESP: Normal chest excursion without splinting or tachypnea; breath sounds clear and equal bilaterally; no wheezes, no rhonchi, no rales, no hypoxia or respiratory distress, speaking full sentences ABD/GI: Normal bowel sounds; non-distended; soft, minimally tender in the epigastric region, negative Murphy's sign, no rebound, no guarding, no peritoneal signs BACK:  The back appears normal and is only tender over  the bilateral lumbar paraspinal muscle chair without midline tenderness, step-off or deformity EXT: Normal ROM in all joints; non-tender to palpation; no edema; normal capillary refill; no cyanosis, no calf tenderness or swelling    SKIN: Normal color for age and race; warm NEURO: Moves all extremities equally, sensation to light touch intact diffusely, cranial nerves II through XII intact, strength 5/5 in all 4 kidneys, normal gait, no saddle anesthesia, normal deep tendon reflexes in bilateral upper and lower extremities  PSYCH: The patient's mood and manner are appropriate. Grooming and personal hygiene are appropriate.  MEDICAL DECISION MAKING: Patient here with complaints of epigastric abdominal pain with vomiting with streaks of blood. Last episode of vomiting several hours ago. Abdominal exam is benign. Suspect this is secondary to gastritis given her recent steroid use for her carpal tunnel. Also complaining of lower back pain. She is neurologically intact without red flag symptoms. Do not feel she needs emergent imaging of her back. Suspect this is a muscle strain, spasm. No neurologic deficits. No fever. No history of back surgery or injections. No known injury. Have advised her to avoid NSAIDs given this may worsen her gastritis. We'll discharge with short course of pain medication. She does have orthopedic on February 2 follow-up for her carpal tunnel and PCP follow-up on February 15. We'll give GI cocktail, Protonix, Zofran and reassess. Discussed with patient that I cannot give her narcotics in the emergency department for her back pain as she is driving herself home.  ED PROGRESS: 12:10 AM Pt able to drink without difficulty. Reports she is feeling much better. We'll discharge home with prescriptions for Protonix, Zofran.  Have advised her avoid NSAIDs at this time as a suspect some of her symptoms are secondary to gastritis. Also recommended she avoid alcohol. I suspect again that this is  gastritis from recent steroid use. No vomiting blood or any vomiting at all in the emergency department. We'll discharge with short prescription for Vicodin for back pain. Discussed return precautions. We'll provide work note. She states she does have a PCP for follow-up. Has an appointment scheduled with her PCP on February 15th. Also has an appointment with orthopedics scheduled on February 2. We'll give gastroenterology follow-up information as well if epigastric abdominal pain and hematemesis continue. She verbalized understanding and is comfortable with this plan.     Layla Maw Hoang [place]ill, DO 07/06/15 [phone]

## 2015-07-05 NOTE — ED Notes (Signed)
Pt. reports bloody emesis x5 yesterday , mild headache , hands tingling and low back pain , denies dysuria or hematuria .

## 2015-07-05 NOTE — ED Notes (Signed)
Provided patient with ice water, tolerating well at this time. 

## 2015-07-06 LAB — LIPASE, BLOOD: Lipase: 20 U/L (ref 11–51)

## 2015-07-06 MED ORDER — ONDANSETRON 4 MG PO TBDP: 4.0000 mg | ORAL_TABLET | Freq: Three times a day (TID) | ORAL | Status: DC | PRN

## 2015-07-06 MED ORDER — HYDROCODONE-ACETAMINOPHEN 5-325 MG PO TABS: 1.0000 | ORAL_TABLET | Freq: Four times a day (QID) | ORAL | Status: DC | PRN

## 2015-07-06 MED ORDER — PANTOPRAZOLE SODIUM 40 MG PO TBEC: 40.0000 mg | DELAYED_RELEASE_TABLET | Freq: Every day | ORAL | Status: DC

## 2015-07-06 NOTE — ED Notes (Signed)
Patient verbalized understanding of discharge instructions and denies any further needs or questions at this time. VS stable. Patient ambulatory with steady gait.  

## 2015-07-06 NOTE — Discharge Instructions (Signed)
Back Pain, Adult °Back pain is very common in adults. The cause of back pain is rarely dangerous and the pain often gets better over time. The cause of your back pain may not be known. Some common causes of back pain include: °· Strain of the muscles or ligaments supporting the spine. °· Wear and tear (degeneration) of the spinal disks. °· Arthritis. °· Direct injury to the back. °For many people, back pain may return. Since back pain is rarely dangerous, most people can learn to manage this condition on their own. °HOME CARE INSTRUCTIONS °Watch your back pain for any changes. The following actions may help to lessen any discomfort you are feeling: °· Remain active. It is stressful on your back to sit or stand in one place for long periods of time. Do not sit, drive, or stand in one place for more than 30 minutes at a time. Take short walks on even surfaces as soon as you are able. Try to increase the length of time you walk each day. °· Exercise regularly as directed by your health care provider. Exercise helps your back heal faster. It also helps avoid future injury by keeping your muscles strong and flexible. °· Do not stay in bed. Resting more than 1-2 days can delay your recovery. °· Pay attention to your body when you bend and lift. The most comfortable positions are those that put less stress on your recovering back. Always use proper lifting techniques, including: °· Bending your knees. °· Keeping the load close to your body. °· Avoiding twisting. °· Find a comfortable position to sleep. Use a firm mattress and lie on your side with your knees slightly bent. If you lie on your back, put a pillow under your knees. °· Avoid feeling anxious or stressed. Stress increases muscle tension and can worsen back pain. It is important to recognize when you are anxious or stressed and learn ways to manage it, such as with exercise. °· Take medicines only as directed by your health care provider. Over-the-counter  medicines to reduce pain and inflammation are often the most helpful. Your health care provider may prescribe muscle relaxant drugs. These medicines help dull your pain so you can more quickly return to your normal activities and healthy exercise. °· Apply ice to the injured area: °· Put ice in a plastic bag. °· Place a towel between your skin and the bag. °· Leave the ice on for 20 minutes, 2-3 times a day for the first 2-3 days. After that, ice and heat may be alternated to reduce pain and spasms. °· Maintain a healthy weight. Excess weight puts extra stress on your back and makes it difficult to maintain good posture. °SEEK MEDICAL CARE IF: °· You have pain that is not relieved with rest or medicine. °· You have increasing pain going down into the legs or buttocks. °· You have pain that does not improve in one week. °· You have night pain. °· You lose weight. °· You have a fever or chills. °SEEK IMMEDIATE MEDICAL CARE IF:  °· You develop new bowel or bladder control problems. °· You have unusual weakness or numbness in your arms or legs. °· You develop nausea or vomiting. °· You develop abdominal pain. °· You feel faint. °  °This information is not intended to replace advice given to you by your health care provider. Make sure you discuss any questions you have with your health care provider. °  °Document Released: 05/21/2005 Document Revised: 06/11/2014 Document Reviewed: 09/22/2013 °Elsevier Interactive Patient Education ©2016 Else[employer]   Inc[employer] ]  Gastritis, Adult Gastritis is soreness and swelling (inflammation) of the lining of the stomach. Gastritis can develop as a sudden onset (acute) or long-term (chronic) condition. If gastritis is not treated, it can lead to stomach bleeding and ulcers. CAUSES  Gastritis occurs when the stomach lining is weak or damaged. Digestive juices from the stomach then inflame the weakened stomach lining. The stomach lining may be weak or damaged due to viral or bacterial  infections. One common bacterial infection is the Helicobacter pylori infection. Gastritis can also result from excessive alcohol consumption, taking certain medicines, or having too much acid in the stomach.  SYMPTOMS  In some cases, there are no symptoms. When symptoms are present, they may include:  Pain or a burning sensation in the upper abdomen.  Nausea.  Vomiting.  An uncomfortable feeling of fullness after eating. DIAGNOSIS  Your caregiver may suspect you have gastritis based on your symptoms and a physical exam. To determine the cause of your gastritis, your caregiver may perform the following:  Blood or stool tests to check for the H pylori bacterium.  Gastroscopy. A thin, flexible tube (endoscope) is passed down the esophagus and into the stomach. The endoscope has a light and camera on the end. Your caregiver uses the endoscope to view the inside of the stomach.  Taking a tissue sample (biopsy) from the stomach to examine under a microscope. TREATMENT  Depending on the cause of your gastritis, medicines may be prescribed. If you have a bacterial infection, such as an H pylori infection, antibiotics may be given. If your gastritis is caused by too much acid in the stomach, H2 blockers or antacids may be given. Your caregiver may recommend that you stop taking aspirin, ibuprofen, or other nonsteroidal anti-inflammatory drugs (NSAIDs). HOME CARE INSTRUCTIONS  Only take over-the-counter or prescription medicines as directed by your caregiver.  If you were given antibiotic medicines, take them as directed. Finish them even if you start to feel better.  Drink enough fluids to keep your urine clear or pale yellow.  Avoid foods and drinks that make your symptoms worse, such as:  Caffeine or alcoholic drinks.  Chocolate.  Peppermint or mint flavorings.  Garlic and onions.  Spicy foods.  Citrus fruits, such as oranges, lemons, or limes.  Tomato-based foods such as sauce,  chili, salsa, and pizza.  Fried and fatty foods.  Eat small, frequent meals instead of large meals. SEEK IMMEDIATE MEDICAL CARE IF:   You have black or dark red stools.  You vomit blood or material that looks like coffee grounds.  You are unable to keep fluids down.  Your abdominal pain gets worse.  You have a fever.  You do not feel better after 1 week.  You have any other questions or concerns. MAKE SURE YOU:  Understand these instructions.  Will watch your condition.  Will get help right away if you are not doing well or get worse.   This information is not intended to replace advice given to you by your health care provider. Make sure you discuss any questions you have with your health care provider.   Document Released: 05/15/2001 Document Revised: 11/20/2011 Document Reviewed: 07/04/2011 Elsevier Interactive Patient Education [employer]

## 2015-07-06 NOTE — ED Notes (Signed)
Patient states she is feeling better, tolerated 2 apple juices and cup of ice water. MD at bedside.

## 2015-08-01 ENCOUNTER — Encounter (HOSPITAL_COMMUNITY): Payer: Self-pay | Admitting: *Deleted

## 2015-08-01 ENCOUNTER — Emergency Department (HOSPITAL_COMMUNITY)
Admission: EM | Admit: 2015-08-01 | Discharge: 2015-08-01 | Disposition: A | Discharge status: Home / Self Care | Payer: Medicaid Other | Attending: Emergency Medicine | Admitting: Emergency Medicine

## 2015-08-01 DIAGNOSIS — F17210 Nicotine dependence, cigarettes, uncomplicated: Secondary | ICD-10-CM | POA: Diagnosis not present

## 2015-08-01 DIAGNOSIS — E1165 Type 2 diabetes mellitus with hyperglycemia: Secondary | ICD-10-CM | POA: Diagnosis not present

## 2015-08-01 DIAGNOSIS — E1169 Type 2 diabetes mellitus with other specified complication: Secondary | ICD-10-CM

## 2015-08-01 DIAGNOSIS — Z79899 Other long term (current) drug therapy: Secondary | ICD-10-CM | POA: Insufficient documentation

## 2015-08-01 DIAGNOSIS — E669 Obesity, unspecified: Secondary | ICD-10-CM | POA: Diagnosis not present

## 2015-08-01 DIAGNOSIS — J45909 Unspecified asthma, uncomplicated: Secondary | ICD-10-CM | POA: Diagnosis not present

## 2015-08-01 DIAGNOSIS — K219 Gastro-esophageal reflux disease without esophagitis: Secondary | ICD-10-CM | POA: Diagnosis not present

## 2015-08-01 DIAGNOSIS — E860 Dehydration: Secondary | ICD-10-CM

## 2015-08-01 DIAGNOSIS — R55 Syncope and collapse: Secondary | ICD-10-CM | POA: Diagnosis not present

## 2015-08-01 DIAGNOSIS — R739 Hyperglycemia, unspecified: Secondary | ICD-10-CM

## 2015-08-01 LAB — URINALYSIS, ROUTINE W REFLEX MICROSCOPIC
Bilirubin Urine: NEGATIVE
HGB URINE DIPSTICK: NEGATIVE
KETONES UR: NEGATIVE mg/dL
LEUKOCYTES UA: NEGATIVE
Nitrite: NEGATIVE
PH: 5.5 (ref 5.0–8.0)
Protein, ur: NEGATIVE mg/dL
Specific Gravity, Urine: 1.045 — ABNORMAL HIGH (ref 1.005–1.030)

## 2015-08-01 LAB — I-STAT CHEM 8, ED
BUN: 17 mg/dL (ref 6–20)
Calcium, Ion: 1.18 mmol/L (ref 1.12–1.23)
Chloride: 102 mmol/L (ref 101–111)
Creatinine, Ser: 0.7 mg/dL (ref 0.44–1.00)
GLUCOSE: 259 mg/dL — AB (ref 65–99)
HCT: 42 % (ref 36.0–46.0)
HEMOGLOBIN: 14.3 g/dL (ref 12.0–15.0)
Potassium: 4.3 mmol/L (ref 3.5–5.1)
SODIUM: 138 mmol/L (ref 135–145)
TCO2: 25 mmol/L (ref 0–100)

## 2015-08-01 LAB — URINE MICROSCOPIC-ADD ON

## 2015-08-01 MED ORDER — METFORMIN HCL 500 MG PO TABS
500.0000 mg | ORAL_TABLET | Freq: Two times a day (BID) | ORAL | Status: DC
Start: 2015-08-01 — End: 2015-08-24

## 2015-08-01 NOTE — ED Provider Notes (Signed)
CSN: [id number]     Arrival date & time 08/01/15  4098 History   First MD Initiated Contact with Patient 08/01/15 1002     Chief Complaint  Patient presents with  . Loss of Consciousness     (Consider location/radiation/quality/duration/timing/severity/associated sxs/prior Treatment) Patient is a 35 y.o. female presenting with syncope. The history is provided by the patient.  Loss of Consciousness Episode history:  Single Most recent episode:  Yesterday Duration: unknown. Timing:  Constant Progression:  Unchanged Chronicity:  New Context comment:  Had just eaten a large meal, felt urge of huger, went to bathroom, brief syncope and laying in floor of bathroom Witnessed: no   Relieved by:  Nothing Worsened by:  Nothing tried Ineffective treatments:  None tried Associated symptoms: nausea (last night, resolved)   Associated symptoms: no anxiety, no chest pain, no confusion, no fever, no shortness of breath and no vomiting   Risk factors: no seizures     Past Medical History  Diagnosis Date  . Asthma   . GERD (gastroesophageal reflux disease)    Past Surgical History  Procedure Laterality Date  . Cesarean section    . Tubal ligation    . Wisdom tooth extraction     Family History  Problem Relation Age of Onset  . Asthma Mother   . Asthma Father    Social History  Substance Use Topics  . Smoking status: Current Every Day Smoker -- 0.25 packs/day    Types: Cigarettes  . Smokeless tobacco: Never Used  . Alcohol Use: No   OB History    Gravida Para Term Preterm AB TAB SAB Ectopic Multiple Living   Review of Systems  Constitutional: Negative for fever.  Respiratory: Negative for shortness of breath.   Cardiovascular: Positive for syncope. Negative for chest pain.  Gastrointestinal: Positive for nausea (last night, resolved). Negative for vomiting.  Psychiatric/Behavioral: Negative for confusion.  All other systems reviewed and are  negative.     Allergies  Review of patient's allergies indicates no known allergies.  Home Medications   Prior to Admission medications   Medication Sig Start Date End Date Taking? Authorizing Provider  HYDROcodone-acetaminophen (NORCO/VICODIN) 5-325 MG tablet Take 1-2 tablets by mouth every 6 (six) hours as needed. 07/06/15  Yes Kristen N Ward, DO  ondansetron (ZOFRAN ODT) 4 MG disintegrating tablet Take 1 tablet (4 mg total) by mouth every 8 (eight) hours as needed for nausea or vomiting. 07/06/15  Yes Kristen N Ward, DO  pantoprazole (PROTONIX) 40 MG tablet Take 1 tablet (40 mg total) by mouth daily. Patient taking differently: Take 40 mg by mouth daily as needed.  07/06/15  Yes Kristen N Ward, DO   BP 111/77 mmHg  Pulse 88  Temp(Src) 97.2 F (36.2 C) (Oral)  Resp 21  Ht  (1.651 m)  Wt 252 lb (114.306 kg)  BMI 41.93 kg/m2  SpO2 98%  LMP 06/21/2015 (Approximate) Physical Exam  Constitutional: She is oriented to person, place, and time. She appears well-developed and well-nourished. No distress.  HENT:  Head: Normocephalic.  Eyes: Conjunctivae are normal.  Neck: Neck supple. No tracheal deviation present.  Cardiovascular: Normal rate, regular rhythm and normal heart sounds.   Pulmonary/Chest: Effort normal. No respiratory distress. She has no wheezes. She has no rales.  Abdominal: Soft. She exhibits no distension. There is no tenderness.  Neurological: She is alert and oriented to person, place, and time.  She has normal strength. No cranial nerve deficit. Coordination and gait normal. GCS eye subscore is 4. GCS verbal subscore is 5. GCS motor subscore is 6.  Normal finger to nose testing and rapid alternating movement   Skin: Skin is warm and dry.  Psychiatric: She has a normal mood and affect.  Vitals reviewed.   ED Course  Procedures (including critical care time) Labs Review Labs Reviewed  URINALYSIS, ROUTINE W REFLEX MICROSCOPIC (NOT AT [facility]) - Abnormal; Notable for  the following:    APPearance CLOUDY (*)    Specific Gravity, Urine 1.045 (*)    Glucose, UA >1000 (*)    All other components within normal limits  URINE MICROSCOPIC-ADD ON - Abnormal; Notable for the following:    Squamous Epithelial / LPF 0-5 (*)    Bacteria, UA FEW (*)    All other components within normal limits  I-STAT CHEM 8, ED - Abnormal; Notable for the following:    Glucose, Bld 259 (*)    All other components within normal limits  URINE CULTURE  POC URINE PREG, ED    Imaging Review No results found. I have personally reviewed and evaluated these images and lab results as part of my medical decision-making.   EKG Interpretation   Date/Time:  Monday August 01 2015 09:53:39 EST Ventricular Rate:  89 PR Interval:  171 QRS Duration: 95 QT Interval:  359 QTC Calculation: 437 R Axis:   98 Text Interpretation:  Sinus rhythm Normal ECG Confirmed by Issaac Shipper MD,  Gelene Recktenwald ([id number]) on 08/01/2015 10:04:38 AM      MDM   Final diagnoses:  Hyperglycemia without ketosis  Mild dehydration  Diabetes mellitus type 2 in obese [facility])  Syncope and collapse   35 y.o. female presents with syncopal episode last night at work where she felt ill after eating a big meal and went to the bathroom slumping in the floor. Refused EMS transport at that time. On arrival well appearing, completely resolved. No concerning EKG findings. Low risk by SF rule. Labs notable for hyperglycemia and glucose in concentrated urine suspicious for new onset type 2 DM with dehydration. Heavy soda consumption and habitus likely contributing. Has small leuks in urine, sent for culture d/t polyuria more likely d/t diabetes but would treat if positive. Pt offered IVF for dehydration but refused IV. I recommended starting low dose metformin and PCP follow up for further workup of diabetes as well as aggressive oral rehydration therapy. Plan to follow up with PCP as needed and return precautions discussed for worsening or  new concerning symptoms.     Lyndal Pulley, MD 08/01/15 [phone]

## 2015-08-01 NOTE — ED Notes (Signed)
Pt arrives via POV. Pt states she had a loc at 0400 this morning while at work. Pt states she hit her head when she feel and has minor swelling over left eye. Pt endorses a mild headache.

## 2015-08-01 NOTE — ED Notes (Signed)
Pt is in stable condition upon d/c and ambulates from ED. 

## 2015-08-01 NOTE — Discharge Instructions (Signed)
Diabetes Mellitus and Food It is important for you to manage your blood sugar (glucose) level. Your blood glucose level can be greatly affected by what you eat. Eating healthier foods in the appropriate amounts throughout the day at about the same time each day will help you control your blood glucose level. It can also help slow or prevent worsening of your diabetes mellitus. Healthy eating may even help you improve the level of your blood pressure and reach or maintain a healthy weight.  General recommendations for healthful eating and cooking habits include:  Eating meals and snacks regularly. Avoid going long periods of time without eating to lose weight.  Eating a diet that consists mainly of plant-based foods, such as fruits, vegetables, nuts, legumes, and whole grains.  Using low-heat cooking methods, such as baking, instead of high-heat cooking methods, such as deep frying. Work with your dietitian to make sure you understand how to use the Nutrition Facts information on food labels. HOW CAN FOOD AFFECT ME? Carbohydrates Carbohydrates affect your blood glucose level more than any other type of food. Your dietitian will help you determine how many carbohydrates to eat at each meal and teach you how to count carbohydrates. Counting carbohydrates is important to keep your blood glucose at a healthy level, especially if you are using insulin or taking certain medicines for diabetes mellitus. Alcohol Alcohol can cause sudden decreases in blood glucose (hypoglycemia), especially if you use insulin or take certain medicines for diabetes mellitus. Hypoglycemia can be a life-threatening condition. Symptoms of hypoglycemia (sleepiness, dizziness, and disorientation) are similar to symptoms of having too much alcohol.  If your health care provider has given you approval to drink alcohol, do so in moderation and use the following guidelines:  Women should not have more than one drink per day, and men  should not have more than two drinks per day. One drink is equal to:  12 oz of beer.  5 oz of wine.  1 oz of hard liquor.  Do not drink on an empty stomach.  Keep yourself hydrated. Have water, diet soda, or unsweetened iced tea.  Regular soda, juice, and other mixers might contain a lot of carbohydrates and should be counted. WHAT FOODS ARE NOT RECOMMENDED? As you make food choices, it is important to remember that all foods are not the same. Some foods have fewer nutrients per serving than other foods, even though they might have the same number of calories or carbohydrates. It is difficult to get your body what it needs when you eat foods with fewer nutrients. Examples of foods that you should avoid that are high in calories and carbohydrates but low in nutrients include:  Trans fats (most processed foods list trans fats on the Nutrition Facts label).  Regular soda.  Juice.  Candy.  Sweets, such as cake, pie, doughnuts, and cookies.  Fried foods. WHAT FOODS CAN I EAT? Eat nutrient-rich foods, which will nourish your body and keep you healthy. The food you should eat also will depend on several factors, including:  The calories you need.  The medicines you take.  Your weight.  Your blood glucose level.  Your blood pressure level.  Your cholesterol level. You should eat a variety of foods, including:  Protein.  Lean cuts of meat.  Proteins low in saturated fats, such as fish, egg whites, and beans. Avoid processed meats.  Fruits and vegetables.  Fruits and vegetables that may help control blood glucose levels, such as apples, mangoes, and   yams.  Dairy products.  Choose fat-free or low-fat dairy products, such as milk, yogurt, and cheese.  Grains, bread, pasta, and rice.  Choose whole grain products, such as multigrain bread, whole oats, and brown rice. These foods may help control blood pressure.  Fats.  Foods containing healthful fats, such as nuts,  avocado, olive oil, canola oil, and fish. DOES EVERYONE WITH DIABETES MELLITUS HAVE THE SAME MEAL PLAN? Because every person with diabetes mellitus is different, there is not one meal plan that works for everyone. It is very important that you meet with a dietitian who will help you create a meal plan that is just right for you.   This information is not intended to replace advice given to you by your health care provider. Make sure you discuss any questions you have with your health care provider.   Document Released: 02/15/2005 Document Revised: 06/11/2014 Document Reviewed: 04/17/2013 Elsevier Interactive Patient Education 2016 Elsevier Inc.  

## 2015-08-02 LAB — URINE CULTURE

## 2015-08-04 ENCOUNTER — Emergency Department (HOSPITAL_COMMUNITY)
Admission: EM | Admit: 2015-08-04 | Discharge: 2015-08-05 | Disposition: A | Discharge status: Home / Self Care | Payer: Medicaid Other | Attending: Emergency Medicine | Admitting: Emergency Medicine

## 2015-08-04 ENCOUNTER — Encounter (HOSPITAL_COMMUNITY): Payer: Self-pay

## 2015-08-04 ENCOUNTER — Emergency Department (HOSPITAL_COMMUNITY): Payer: Medicaid Other

## 2015-08-04 ENCOUNTER — Other Ambulatory Visit: Payer: Self-pay

## 2015-08-04 DIAGNOSIS — J45901 Unspecified asthma with (acute) exacerbation: Secondary | ICD-10-CM | POA: Insufficient documentation

## 2015-08-04 DIAGNOSIS — E1165 Type 2 diabetes mellitus with hyperglycemia: Secondary | ICD-10-CM | POA: Diagnosis not present

## 2015-08-04 DIAGNOSIS — R197 Diarrhea, unspecified: Secondary | ICD-10-CM | POA: Insufficient documentation

## 2015-08-04 DIAGNOSIS — R202 Paresthesia of skin: Secondary | ICD-10-CM | POA: Diagnosis not present

## 2015-08-04 DIAGNOSIS — I10 Essential (primary) hypertension: Secondary | ICD-10-CM | POA: Insufficient documentation

## 2015-08-04 DIAGNOSIS — K219 Gastro-esophageal reflux disease without esophagitis: Secondary | ICD-10-CM | POA: Insufficient documentation

## 2015-08-04 DIAGNOSIS — Z87891 Personal history of nicotine dependence: Secondary | ICD-10-CM | POA: Diagnosis not present

## 2015-08-04 DIAGNOSIS — Z7984 Long term (current) use of oral hypoglycemic drugs: Secondary | ICD-10-CM | POA: Diagnosis not present

## 2015-08-04 DIAGNOSIS — R0602 Shortness of breath: Secondary | ICD-10-CM | POA: Diagnosis present

## 2015-08-04 DIAGNOSIS — R739 Hyperglycemia, unspecified: Secondary | ICD-10-CM

## 2015-08-04 HISTORY — DX: Essential (primary) hypertension: I10

## 2015-08-04 HISTORY — DX: Type 2 diabetes mellitus without complications: E11.9

## 2015-08-04 LAB — BASIC METABOLIC PANEL
ANION GAP: 12 (ref 5–15)
BUN: 13 mg/dL (ref 6–20)
CALCIUM: 9.1 mg/dL (ref 8.9–10.3)
CO2: 21 mmol/L — ABNORMAL LOW (ref 22–32)
CREATININE: 0.9 mg/dL (ref 0.44–1.00)
Chloride: 102 mmol/L (ref 101–111)
GLUCOSE: 272 mg/dL — AB (ref 65–99)
Potassium: 4.1 mmol/L (ref 3.5–5.1)
Sodium: 135 mmol/L (ref 135–145)

## 2015-08-04 LAB — CBC
HCT: 39 % (ref 36.0–46.0)
Hemoglobin: 13 g/dL (ref 12.0–15.0)
MCH: 28.6 pg (ref 26.0–34.0)
MCHC: 33.3 g/dL (ref 30.0–36.0)
MCV: 85.7 fL (ref 78.0–100.0)
PLATELETS: 297 10*3/uL (ref 150–400)
RBC: 4.55 MIL/uL (ref 3.87–5.11)
RDW: 12.6 % (ref 11.5–15.5)
WBC: 6.3 10*3/uL (ref 4.0–10.5)

## 2015-08-04 LAB — CBG MONITORING, ED: GLUCOSE-CAPILLARY: 255 mg/dL — AB (ref 65–99)

## 2015-08-04 LAB — I-STAT TROPONIN, ED: TROPONIN I, POC: 0 ng/mL (ref 0.00–0.08)

## 2015-08-04 NOTE — ED Notes (Signed)
Pt states she started Metformin two days ago and since starting it she has felt weak and ShOB. Pt states she has a dry cough.

## 2015-08-05 MED ORDER — ALBUTEROL SULFATE HFA 108 (90 BASE) MCG/ACT IN AERS
2.0000 | INHALATION_SPRAY | Freq: Once | RESPIRATORY_TRACT | Status: AC
  Administered 2015-08-05: 2 via RESPIRATORY_TRACT
  Filled 2015-08-05: qty 6.7

## 2015-08-05 NOTE — ED Notes (Signed)
Pt has hx of asthma.

## 2015-08-05 NOTE — ED Provider Notes (Signed)
CSN: [id number]     Arrival date & time 08/04/15  2208 History  By signing my name below, I, Iona BeardChristian Pulliam, attest that this documentation has been prepared under the direction and in the presence of Zadie Rhineonald Doxie Augenstein, MD.   Electronically Signed: Iona Beardhristian Pulliam, ED Scribe. 08/05/2015. 12:23 AM   Chief Complaint  Patient presents with  . Shortness of Breath    Patient is a 35 y.o. female presenting with shortness of breath. The history is provided by the patient. No language interpreter was used.  Shortness of Breath Severity:  Mild Onset quality:  Gradual Duration:  2 days Timing:  Constant Progression:  Unchanged Chronicity:  New Relieved by:  Nothing Worsened by:  Nothing tried Associated symptoms: chest pain   Associated symptoms: no abdominal pain, no headaches and no vomiting    HPI Comments: Sarah Garrison is a 35 y.o. female with PMHx of Gerd, DM, and HTN who presents to the Emergency Department complaining of gradual onset, constant, weakness to left arm and left leg, onset about two days ago. Pt also complains of numbness to left arm, numbness to toes in both feet, chest pain, shortness of breath, and diarrhea.  She reports that her right hand has been number for approximately four months but the numbness has spread since beginning her new medication. No worsening or alleviating factors noted. Pt denies fever, vomiting, hematemesis, headache, abdominal pain, or any other pertinent symptoms.    Past Medical History  Diagnosis Date  . Asthma   . GERD (gastroesophageal reflux disease)   . Diabetes mellitus without complication (HCC)   . Hypertension    Past Surgical History  Procedure Laterality Date  . Cesarean section    . Tubal ligation    . Wisdom tooth extraction     Family History  Problem Relation Age of Onset  . Asthma Mother   . Asthma Father    Social History  Substance Use Topics  . Smoking status: Former Smoker -- 0.25 packs/day    Types:  Cigarettes    Quit date: 11/02/2013  . Smokeless tobacco: Never Used  . Alcohol Use: No   OB History    Gravida Para Term Preterm AB TAB SAB Ectopic Multiple Living   5 4   1  1   4      Review of Systems  Respiratory: Positive for shortness of breath.   Cardiovascular: Positive for chest pain.  Gastrointestinal: Positive for diarrhea. Negative for vomiting and abdominal pain.  Neurological: Positive for weakness and numbness. Negative for headaches.  All other systems reviewed and are negative.   Allergies  Review of patient's allergies indicates no known allergies.  Home Medications   Prior to Admission medications   Medication Sig Start Date End Date Taking? Authorizing Provider  HYDROcodone-acetaminophen (NORCO/VICODIN) 5-325 MG tablet Take 1-2 tablets by mouth every 6 (six) hours as needed. 07/06/15   Kristen N Ward, DO  metFORMIN (GLUCOPHAGE) 500 MG tablet Take 1 tablet (500 mg total) by mouth 2 (two) times daily with a meal. 08/01/15   Lyndal Pulleyaniel Knott, MD  ondansetron (ZOFRAN ODT) 4 MG disintegrating tablet Take 1 tablet (4 mg total) by mouth every 8 (eight) hours as needed for nausea or vomiting. 07/06/15   Kristen N Ward, DO  pantoprazole (PROTONIX) 40 MG tablet Take 1 tablet (40 mg total) by mouth daily. Patient taking differently: Take 40 mg by mouth daily as needed.  07/06/15   Kristen N Ward, DO   BP 135/93  mmHg  Pulse 96  Temp(Src) 98.6 F (37 C) (Oral)  Resp 16  Ht  (1.651 m)  Wt 252 lb (114.306 kg)  BMI 41.93 kg/m2  SpO2 96%  LMP 07/25/2015 Physical Exam CONSTITUTIONAL: Well developed/well nourished HEAD: Normocephalic/atraumatic EYES: EOMI/PERRL ENMT: Mucous membranes moist NECK: supple no meningeal signs SPINE/BACK:entire spine nontender CV: S1/S2 noted, no murmurs/rubs/gallops noted LUNGS: Lungs are clear to auscultation bilaterally, no apparent distress ABDOMEN: soft, nontender, no rebound or guarding, bowel sounds noted throughout abdomen GU:no cva  tenderness NEURO: Pt is awake/alert/appropriate, moves all extremitiesx4.  No facial droop. No arm or leg drift. No ataxia. Equal hand grips noted. Pt reports she has numbness in toes and right hand.   EXTREMITIES: pulses normal/equal, full ROM  SKIN: warm, color normal PSYCH: no abnormalities of mood noted, alert and oriented to situation  ED Course  Procedures  DIAGNOSTIC STUDIES: Oxygen Saturation is 96% on RA, adequate by my interpretation.    COORDINATION OF CARE: 12:41 AM-Discussed treatment plan which includes CXR, BMP, CBC, albuterol, and EKG with pt at bedside and pt agreed to plan.    Pt well appearing No signs of stroke, and she admits that her numbness has been present for months Now reports some SOB with only mild chest tightness, no pleuritic CP She is well appearing, sleeping on my arrival to room I doubt ACS/PE/CHF at this time Will d/c home and stressed importance of PCP evaluation Labs Review Labs Reviewed  BASIC METABOLIC PANEL - Abnormal; Notable for the following:    CO2 21 (*)    Glucose, Bld 272 (*)    All other components within normal limits  CBG MONITORING, ED - Abnormal; Notable for the following:    Glucose-Capillary 255 (*)    All other components within normal limits  CBC  I-STAT TROPOININ, ED  CBG MONITORING, ED    Imaging Review Dg Chest 2 View  08/04/2015  CLINICAL DATA:  35 year old female with chest pain EXAM: CHEST  2 VIEW COMPARISON:  Radiograph dated 03/06/2005 FINDINGS: The heart size and mediastinal contours are within normal limits. Both lungs are clear. The visualized skeletal structures are unremarkable. IMPRESSION: No active cardiopulmonary disease. Electronically Signed   By: Elgie Collard M.D.   On: 08/04/2015 22:52   I have personally reviewed and evaluated these images and lab results as part of my medical decision-making.   EKG Interpretation   Date/Time:  Friday August 05 2015 01:10:51 EST Ventricular Rate:  85 PR  Interval:  170 QRS Duration: 82 QT Interval:  359 QTC Calculation: 427 R Axis:   88 Text Interpretation:  Sinus rhythm Low voltage, precordial leads No  significant change since last tracing Confirmed by Bebe Shaggy  MD, Dorinda Hill  [phone]) on 08/05/2015 1:14:50 AM      MDM   Final diagnoses:  Shortness of breath  Hyperglycemia  Paresthesia   Nursing notes including past medical history and social history reviewed and considered in documentation xrays/imaging reviewed by myself and considered during evaluation Labs/vital reviewed myself and considered during evaluation    I personally performed the services described in this documentation, which was scribed in my presence. The recorded information has been reviewed and is accurate.       Zadie Rhine, MD 08/05/15 [phone]

## 2015-08-09 ENCOUNTER — Encounter (HOSPITAL_COMMUNITY): Payer: Self-pay | Admitting: Emergency Medicine

## 2015-08-09 DIAGNOSIS — K219 Gastro-esophageal reflux disease without esophagitis: Secondary | ICD-10-CM | POA: Diagnosis not present

## 2015-08-09 DIAGNOSIS — Z3202 Encounter for pregnancy test, result negative: Secondary | ICD-10-CM | POA: Insufficient documentation

## 2015-08-09 DIAGNOSIS — H53149 Visual discomfort, unspecified: Secondary | ICD-10-CM | POA: Insufficient documentation

## 2015-08-09 DIAGNOSIS — Z87891 Personal history of nicotine dependence: Secondary | ICD-10-CM | POA: Diagnosis not present

## 2015-08-09 DIAGNOSIS — R200 Anesthesia of skin: Secondary | ICD-10-CM | POA: Diagnosis not present

## 2015-08-09 DIAGNOSIS — Z7984 Long term (current) use of oral hypoglycemic drugs: Secondary | ICD-10-CM | POA: Insufficient documentation

## 2015-08-09 DIAGNOSIS — J45909 Unspecified asthma, uncomplicated: Secondary | ICD-10-CM | POA: Diagnosis not present

## 2015-08-09 DIAGNOSIS — R51 Headache: Secondary | ICD-10-CM | POA: Diagnosis not present

## 2015-08-09 DIAGNOSIS — E119 Type 2 diabetes mellitus without complications: Secondary | ICD-10-CM | POA: Diagnosis not present

## 2015-08-09 DIAGNOSIS — Z87440 Personal history of urinary (tract) infections: Secondary | ICD-10-CM | POA: Insufficient documentation

## 2015-08-09 DIAGNOSIS — I10 Essential (primary) hypertension: Secondary | ICD-10-CM | POA: Insufficient documentation

## 2015-08-09 DIAGNOSIS — R112 Nausea with vomiting, unspecified: Secondary | ICD-10-CM | POA: Insufficient documentation

## 2015-08-09 LAB — COMPREHENSIVE METABOLIC PANEL
ALT: 17 U/L (ref 14–54)
ANION GAP: 10 (ref 5–15)
AST: 14 U/L — ABNORMAL LOW (ref 15–41)
Albumin: 3.2 g/dL — ABNORMAL LOW (ref 3.5–5.0)
Alkaline Phosphatase: 87 U/L (ref 38–126)
BUN: 12 mg/dL (ref 6–20)
CHLORIDE: 105 mmol/L (ref 101–111)
CO2: 25 mmol/L (ref 22–32)
CREATININE: 0.68 mg/dL (ref 0.44–1.00)
Calcium: 9.4 mg/dL (ref 8.9–10.3)
Glucose, Bld: 205 mg/dL — ABNORMAL HIGH (ref 65–99)
Potassium: 4.1 mmol/L (ref 3.5–5.1)
Sodium: 140 mmol/L (ref 135–145)
Total Bilirubin: 0.5 mg/dL (ref 0.3–1.2)
Total Protein: 5.6 g/dL — ABNORMAL LOW (ref 6.5–8.1)

## 2015-08-09 LAB — URINALYSIS, ROUTINE W REFLEX MICROSCOPIC
Bilirubin Urine: NEGATIVE
GLUCOSE, UA: 250 mg/dL — AB
Ketones, ur: NEGATIVE mg/dL
Nitrite: NEGATIVE
PROTEIN: NEGATIVE mg/dL
SPECIFIC GRAVITY, URINE: 1.026 (ref 1.005–1.030)
pH: 7.5 (ref 5.0–8.0)

## 2015-08-09 LAB — URINE MICROSCOPIC-ADD ON

## 2015-08-09 LAB — CBC
HEMATOCRIT: 37.9 % (ref 36.0–46.0)
HEMOGLOBIN: 12.9 g/dL (ref 12.0–15.0)
MCH: 29.3 pg (ref 26.0–34.0)
MCHC: 34 g/dL (ref 30.0–36.0)
MCV: 85.9 fL (ref 78.0–100.0)
PLATELETS: 298 10*3/uL (ref 150–400)
RBC: 4.41 MIL/uL (ref 3.87–5.11)
RDW: 12.4 % (ref 11.5–15.5)
WBC: 6.6 10*3/uL (ref 4.0–10.5)

## 2015-08-09 LAB — POC URINE PREG, ED: Preg Test, Ur: NEGATIVE

## 2015-08-09 MED ORDER — OXYCODONE-ACETAMINOPHEN 5-325 MG PO TABS
ORAL_TABLET | ORAL | Status: AC
  Administered 2015-08-09: 1 via ORAL
  Filled 2015-08-09: qty 1

## 2015-08-09 MED ORDER — OXYCODONE-ACETAMINOPHEN 5-325 MG PO TABS
1.0000 | ORAL_TABLET | Freq: Once | ORAL | Status: AC
  Administered 2015-08-09: 1 via ORAL

## 2015-08-09 NOTE — ED Notes (Signed)
Pt. reports headache with nausea and emesis onset Friday last week . Denies head injury , no fever or chills.

## 2015-08-10 ENCOUNTER — Emergency Department (HOSPITAL_COMMUNITY)
Admission: EM | Admit: 2015-08-10 | Discharge: 2015-08-10 | Disposition: A | Discharge status: Home / Self Care | Payer: Medicaid Other | Attending: Emergency Medicine | Admitting: Emergency Medicine

## 2015-08-10 DIAGNOSIS — R519 Headache, unspecified: Secondary | ICD-10-CM

## 2015-08-10 DIAGNOSIS — R51 Headache: Secondary | ICD-10-CM

## 2015-08-10 MED ORDER — DIPHENHYDRAMINE HCL 25 MG PO CAPS
50.0000 mg | ORAL_CAPSULE | Freq: Once | ORAL | Status: AC
  Administered 2015-08-10: 50 mg via ORAL

## 2015-08-10 MED ORDER — KETOROLAC TROMETHAMINE 60 MG/2ML IM SOLN
60.0000 mg | Freq: Once | INTRAMUSCULAR | Status: AC
  Administered 2015-08-10: 60 mg via INTRAMUSCULAR

## 2015-08-10 MED ORDER — FOSFOMYCIN TROMETHAMINE 3 G PO PACK
3.0000 g | PACK | Freq: Once | ORAL | Status: AC
  Administered 2015-08-10: 3 g via ORAL
  Filled 2015-08-10: qty 3

## 2015-08-10 MED ORDER — METOCLOPRAMIDE HCL 10 MG PO TABS
10.0000 mg | ORAL_TABLET | Freq: Once | ORAL | Status: AC
  Administered 2015-08-10: 10 mg via ORAL

## 2015-08-10 NOTE — ED Notes (Signed)
Pt departed in NAD.  

## 2015-08-10 NOTE — Discharge Instructions (Signed)
General Headache Without Cause [name], continue to take tylenol or ibuprofen as needed for your headache.  See a primary care doctor within 3 days for close follow up. If symptoms worsen, come back to the ED immediately. Thank you. A headache is pain or discomfort felt around the head or neck area. There are many causes and types of headaches. In some cases, the cause may not be found.  HOME CARE  Managing Pain  Take over-the-counter and prescription medicines only as told by your doctor.  Lie down in a dark, quiet room when you have a headache.  If directed, apply ice to the head and neck area:  Put ice in a plastic bag.  Place a towel between your skin and the bag.  Leave the ice on for 20 minutes, 2-3 times per day.  Use a heating pad or hot shower to apply heat to the head and neck area as told by your doctor.  Keep lights dim if bright lights bother you or make your headaches worse. Eating and Drinking  Eat meals on a regular schedule.  Lessen how much alcohol you drink.  Lessen how much caffeine you drink, or stop drinking caffeine. General Instructions  Keep all follow-up visits as told by your doctor. This is important.  Keep a journal to find out if certain things bring on headaches. For example, write down:  What you eat and drink.  How much sleep you get.  Any change to your diet or medicines.  Relax by getting a massage or doing other relaxing activities.  Lessen stress.  Sit up straight. Do not tighten (tense) your muscles.  Do not use tobacco products. This includes cigarettes, chewing tobacco, or e-cigarettes. If you need help quitting, ask your doctor.  Exercise regularly as told by your doctor.  Get enough sleep. This often means 7-9 hours of sleep. GET HELP IF:  Your symptoms are not helped by medicine.  You have a headache that feels different than the other headaches.  You feel sick to your stomach (nauseous) or you throw up  (vomit).  You have a fever. GET HELP RIGHT AWAY IF:   Your headache becomes really bad.  You keep throwing up.  You have a stiff neck.  You have trouble seeing.  You have trouble speaking.  You have pain in the eye or ear.  Your muscles are weak or you lose muscle control.  You lose your balance or have trouble walking.  You feel like you will pass out (faint) or you pass out.  You have confusion.   This information is not intended to replace advice given to you by your health care provider. Make sure you discuss any questions you have with your health care provider.   Document Released: 02/28/2008 Document Revised: 02/09/2015 Document Reviewed: 09/13/2014 Elsevier Interactive Patient Education [employer]2016 [employer] Inc.

## 2015-08-10 NOTE — ED Provider Notes (Signed)
CSN: [id number]     Arrival date & time 08/09/15  1954 History  By signing my name below, I, Budd PalmerVanessa Prueter, attest that this documentation has been prepared under the direction and in the presence of Tomasita CrumbleAdeleke Chrisy Hillebrand, MD. Electronically Signed: Budd PalmerVanessa Prueter, ED Scribe. 08/10/2015. 3:27 AM.    Chief Complaint  Patient presents with  . Headache  . Emesis   The history is provided by the patient. No language interpreter was used.   HPI Comments: Sarah Garrison is a 35 y.o. female former smoker with a PMHX of HTN, DM, GERD, and asthma who presents to the Emergency Department complaining of gradually worsening headache and emesis onset 4 days ago. She reports associated nausea and photophobia. She has taken ibuprofen for this without relief. She states she has never had a headache this severe before. She reports a PMHx of UTI, but has not had any recently. She notes that this does not feel like her previous UTI's. She notes she has numbness in he fingers since 09/12/2014 for which she is being seen and was diagnosed with Raynaud's. Pt denies fever, diarrhea, and neck pain.   Past Medical History  Diagnosis Date  . Asthma   . GERD (gastroesophageal reflux disease)   . Diabetes mellitus without complication (HCC)   . Hypertension    Past Surgical History  Procedure Laterality Date  . Cesarean section    . Tubal ligation    . Wisdom tooth extraction     Family History  Problem Relation Age of Onset  . Asthma Mother   . Asthma Father    Social History  Substance Use Topics  . Smoking status: Former Smoker -- 0.25 packs/day    Types: Cigarettes    Quit date: 11/02/2013  . Smokeless tobacco: Never Used  . Alcohol Use: No   OB History    Gravida Para Term Preterm AB TAB SAB Ectopic Multiple Living   5 4   1  1   4      Review of Systems A complete 10 system review of systems was obtained and all systems are negative except as noted in the HPI and PMH.   Allergies  Review of  patient's allergies indicates no known allergies.  Home Medications   Prior to Admission medications   Medication Sig Start Date End Date Taking? Authorizing Provider  HYDROcodone-acetaminophen (NORCO/VICODIN) 5-325 MG tablet Take 1-2 tablets by mouth every 6 (six) hours as needed. 07/06/15   Kristen N Ward, DO  metFORMIN (GLUCOPHAGE) 500 MG tablet Take 1 tablet (500 mg total) by mouth 2 (two) times daily with a meal. 08/01/15   Lyndal Pulleyaniel Knott, MD  ondansetron (ZOFRAN ODT) 4 MG disintegrating tablet Take 1 tablet (4 mg total) by mouth every 8 (eight) hours as needed for nausea or vomiting. 07/06/15   Kristen N Ward, DO  pantoprazole (PROTONIX) 40 MG tablet Take 1 tablet (40 mg total) by mouth daily. Patient taking differently: Take 40 mg by mouth daily as needed.  07/06/15   Kristen N Ward, DO   BP 131/94 mmHg  Pulse 87  Temp(Src) 97.7 F (36.5 C) (Oral)  Resp 16  SpO2 98%  LMP 07/22/2015 Physical Exam  Constitutional: She is oriented to person, place, and time. She appears well-developed and well-nourished. No distress.  HENT:  Head: Normocephalic and atraumatic.  Nose: Nose normal.  Mouth/Throat: Oropharynx is clear and moist. No oropharyngeal exudate.  Eyes: Conjunctivae and EOM are normal. Pupils are equal, round, and reactive to  light. No scleral icterus.  Neck: Normal range of motion. Neck supple. No JVD present. No tracheal deviation present. No thyromegaly present.  Cardiovascular: Normal rate, regular rhythm and normal heart sounds.  Exam reveals no gallop and no friction rub.   No murmur heard. Pulmonary/Chest: Effort normal and breath sounds normal. No respiratory distress. She has no wheezes. She exhibits no tenderness.  Abdominal: Soft. Bowel sounds are normal. She exhibits no distension and no mass. There is no tenderness. There is no rebound and no guarding.  Musculoskeletal: Normal range of motion. She exhibits no edema or tenderness.  Lymphadenopathy:    She has no cervical  adenopathy.  Neurological: She is alert and oriented to person, place, and time. No cranial nerve deficit. She exhibits normal muscle tone. Coordination normal.  Skin: Skin is warm and dry. No rash noted. No erythema. No pallor.  Nursing note and vitals reviewed.   ED Course  Procedures  DIAGNOSTIC STUDIES: Oxygen Saturation is 100% on RA, normal by my interpretation.    COORDINATION OF CARE: 3:24 AM - Discussed plans to order medications for nausea. Pt advised of plan for treatment and pt agrees.  Labs Review Labs Reviewed  COMPREHENSIVE METABOLIC PANEL - Abnormal; Notable for the following:    Glucose, Bld 205 (*)    Total Protein 5.6 (*)    Albumin 3.2 (*)    AST 14 (*)    All other components within normal limits  URINALYSIS, ROUTINE W REFLEX MICROSCOPIC (NOT AT [facility]) - Abnormal; Notable for the following:    APPearance CLOUDY (*)    Glucose, UA 250 (*)    Hgb urine dipstick LARGE (*)    Leukocytes, UA SMALL (*)    All other components within normal limits  URINE MICROSCOPIC-ADD ON - Abnormal; Notable for the following:    Squamous Epithelial / LPF 0-5 (*)    Bacteria, UA MANY (*)    All other components within normal limits  CBC  POC URINE PREG, ED    Imaging Review No results found. I have personally reviewed and evaluated these images and lab results as part of my medical decision-making.   EKG Interpretation None      MDM   Final diagnoses:  None   Patient presents to the ED for headache for the past several days.  She states is was a gradual progression of the headache.  I do not believe this is an acute intracranial emergency.  UA shows bacteria but patient having no symptoms, will treat with fosfomycin.  She is refusing IV so was given toradol, reglan and benadryl for headache orally.    Upon repeat evaluation, symptoms have improved. Patient is found resting complaint intermittent no acute distress. Vital signs remain within her normal limits and she is  safe for discharge.   I personally performed the services described in this documentation, which was scribed in my presence. The recorded information has been reviewed and is accurate.     Tomasita Crumble, MD 08/10/15 0630

## 2015-08-16 ENCOUNTER — Inpatient Hospital Stay: Payer: Medicaid Other | Admitting: Internal Medicine

## 2015-08-23 ENCOUNTER — Encounter (HOSPITAL_COMMUNITY): Payer: Self-pay | Admitting: Emergency Medicine

## 2015-08-23 ENCOUNTER — Emergency Department (HOSPITAL_COMMUNITY)
Admission: EM | Admit: 2015-08-23 | Discharge: 2015-08-23 | Disposition: A | Discharge status: Home / Self Care | Payer: Medicaid Other | Attending: Emergency Medicine | Admitting: Emergency Medicine

## 2015-08-23 DIAGNOSIS — Z7984 Long term (current) use of oral hypoglycemic drugs: Secondary | ICD-10-CM | POA: Diagnosis not present

## 2015-08-23 DIAGNOSIS — E1165 Type 2 diabetes mellitus with hyperglycemia: Secondary | ICD-10-CM | POA: Insufficient documentation

## 2015-08-23 DIAGNOSIS — Z87891 Personal history of nicotine dependence: Secondary | ICD-10-CM | POA: Diagnosis not present

## 2015-08-23 DIAGNOSIS — I10 Essential (primary) hypertension: Secondary | ICD-10-CM | POA: Insufficient documentation

## 2015-08-23 DIAGNOSIS — M79671 Pain in right foot: Secondary | ICD-10-CM | POA: Diagnosis not present

## 2015-08-23 DIAGNOSIS — R519 Headache, unspecified: Secondary | ICD-10-CM

## 2015-08-23 DIAGNOSIS — M791 Myalgia, unspecified site: Secondary | ICD-10-CM

## 2015-08-23 DIAGNOSIS — K219 Gastro-esophageal reflux disease without esophagitis: Secondary | ICD-10-CM | POA: Diagnosis not present

## 2015-08-23 DIAGNOSIS — R51 Headache: Secondary | ICD-10-CM | POA: Insufficient documentation

## 2015-08-23 DIAGNOSIS — Z79899 Other long term (current) drug therapy: Secondary | ICD-10-CM | POA: Diagnosis not present

## 2015-08-23 DIAGNOSIS — R739 Hyperglycemia, unspecified: Secondary | ICD-10-CM

## 2015-08-23 DIAGNOSIS — J45909 Unspecified asthma, uncomplicated: Secondary | ICD-10-CM | POA: Insufficient documentation

## 2015-08-23 LAB — CBC
HCT: 38.1 % (ref 36.0–46.0)
Hemoglobin: 12.6 g/dL (ref 12.0–15.0)
MCH: 28.2 pg (ref 26.0–34.0)
MCHC: 33.1 g/dL (ref 30.0–36.0)
MCV: 85.2 fL (ref 78.0–100.0)
PLATELETS: 331 10*3/uL (ref 150–400)
RBC: 4.47 MIL/uL (ref 3.87–5.11)
RDW: 12.3 % (ref 11.5–15.5)
WBC: 7.7 10*3/uL (ref 4.0–10.5)

## 2015-08-23 LAB — BASIC METABOLIC PANEL WITH GFR
Anion gap: 11 (ref 5–15)
BUN: 13 mg/dL (ref 6–20)
CO2: 23 mmol/L (ref 22–32)
Calcium: 9.5 mg/dL (ref 8.9–10.3)
Chloride: 103 mmol/L (ref 101–111)
Creatinine, Ser: 0.76 mg/dL (ref 0.44–1.00)
GFR calc Af Amer: >60 mL/min
GFR calc non Af Amer: >60 mL/min
Glucose, Bld: 282 mg/dL — ABNORMAL HIGH (ref 65–99)
Potassium: 4.5 mmol/L (ref 3.5–5.1)
Sodium: 137 mmol/L (ref 135–145)

## 2015-08-23 LAB — CBG MONITORING, ED
GLUCOSE-CAPILLARY: 266 mg/dL — AB (ref 65–99)
Glucose-Capillary: 269 mg/dL — ABNORMAL HIGH (ref 65–99)

## 2015-08-23 MED ORDER — NAPROXEN 500 MG PO TABS: 500.0000 mg | ORAL_TABLET | Freq: Two times a day (BID) | ORAL | Status: DC | PRN

## 2015-08-23 MED ORDER — CEPHALEXIN 500 MG PO CAPS: 500.0000 mg | ORAL_CAPSULE | Freq: Four times a day (QID) | ORAL | Status: DC

## 2015-08-23 MED ORDER — METOCLOPRAMIDE HCL 10 MG PO TABS
10.0000 mg | ORAL_TABLET | Freq: Once | ORAL | Status: AC
Start: 2015-08-23 — End: 2015-08-23
  Administered 2015-08-23: 10 mg via ORAL
  Filled 2015-08-23: qty 1

## 2015-08-23 MED ORDER — IBUPROFEN 800 MG PO TABS: 800.0000 mg | ORAL_TABLET | Freq: Three times a day (TID) | ORAL | Status: DC | PRN

## 2015-08-23 MED ORDER — KETOROLAC TROMETHAMINE 60 MG/2ML IM SOLN
60.0000 mg | Freq: Once | INTRAMUSCULAR | Status: AC
  Administered 2015-08-23: 60 mg via INTRAMUSCULAR
  Filled 2015-08-23: qty 2

## 2015-08-23 MED ORDER — DIPHENHYDRAMINE HCL 25 MG PO CAPS
25.0000 mg | ORAL_CAPSULE | Freq: Once | ORAL | Status: DC
  Filled 2015-08-23: qty 1

## 2015-08-23 NOTE — ED Notes (Addendum)
Pt reports that she has had generalized body aches and headaches x 3 days. Pt reports that she stopped her metformin yesterday bc it was making her feel fatigued. Pt alert x4. NAD at this time.

## 2015-08-23 NOTE — ED Notes (Signed)
CBG- 266 

## 2015-08-23 NOTE — ED Provider Notes (Signed)
CSN: [id number]     Arrival date & time 08/23/15  4098 History   First MD Initiated Contact with Patient 08/23/15 1217     Chief Complaint  Patient presents with  . Headache  . Generalized Body Aches     (Consider location/radiation/quality/duration/timing/severity/associated sxs/prior Treatment) The history is provided by the patient.     Pt with hx DM, HTN, asthma, GERD, 6 ED visits in 3 months, p/w myalgias, mostly in her back, and redness in the plantar aspect of her right foot, and headache.  She was diagnosed with diabetes 2-3 weeks ago and has been on metformin, did not take it today because she feels it makes her tired.  The headache has been ongoing x 3 days as well and is similar to the one she states she was treated for at her last ED visit.  States she has been seen by orthopedics for paresthesias and was diagnosed with carpal tunnel - states that since that test whenever she is pricked by a needle for IV blood draw or CBG her entire arm or finger goes numb.   Denies fevers, chills, URI symptoms, cough, SOB, CP, abdominal pain, urinary, vaginal, or bowel symptoms.  Denies leg swelling.    Has an appointment with a new PCP tomorrow morning at 11am.    Past Medical History  Diagnosis Date  . Asthma   . GERD (gastroesophageal reflux disease)   . Diabetes mellitus without complication (HCC)   . Hypertension    Past Surgical History  Procedure Laterality Date  . Cesarean section    . Tubal ligation    . Wisdom tooth extraction     Family History  Problem Relation Age of Onset  . Asthma Mother   . Asthma Father    Social History  Substance Use Topics  . Smoking status: Former Smoker -- 0.25 packs/day    Types: Cigarettes    Quit date: 11/02/2013  . Smokeless tobacco: Never Used  . Alcohol Use: No   OB History    Gravida Para Term Preterm AB TAB SAB Ectopic Multiple Living   Review of Systems  All other systems reviewed and are  negative.     Allergies  Review of patient's allergies indicates no known allergies.  Home Medications   Prior to Admission medications   Medication Sig Start Date End Date Taking? Authorizing Provider  albuterol (PROVENTIL HFA;VENTOLIN HFA) 108 (90 Base) MCG/ACT inhaler Inhale 2 puffs into the lungs every 6 (six) hours as needed for wheezing or shortness of breath.    Historical Provider, MD  amLODipine (NORVASC) 2.5 MG tablet Take 2.5 mg by mouth daily.    Historical Provider, MD  HYDROcodone-acetaminophen (NORCO/VICODIN) 5-325 MG tablet Take 1-2 tablets by mouth every 6 (six) hours as needed. Patient taking differently: Take 1-2 tablets by mouth every 6 (six) hours as needed for moderate pain.  07/06/15   Kristen N Ward, DO  metFORMIN (GLUCOPHAGE) 500 MG tablet Take 1 tablet (500 mg total) by mouth 2 (two) times daily with a meal. 08/01/15   Lyndal Pulley, MD  ondansetron (ZOFRAN ODT) 4 MG disintegrating tablet Take 1 tablet (4 mg total) by mouth every 8 (eight) hours as needed for nausea or vomiting. 07/06/15   Kristen N Ward, DO  pantoprazole (PROTONIX) 40 MG tablet Take 1 tablet (40 mg total) by mouth daily. Patient taking differently: Take 40 mg by mouth daily as  needed.  07/06/15   Kristen N Ward, DO   BP 120/85 mmHg  Pulse 107  Temp(Src) 98.1 F (36.7 C) (Oral)  Resp 18  Ht 5\' 5"  (1.651 m)  Wt 114.56 kg  BMI 42.03 kg/m2  SpO2 99%  LMP 08/03/2015 (Exact Date) Physical Exam  Constitutional: She appears well-developed and well-nourished. No distress.  HENT:  Head: Normocephalic and atraumatic.  Neck: Neck supple.  Cardiovascular: Normal rate and regular rhythm.   Pulmonary/Chest: Effort normal and breath sounds normal. No respiratory distress. She has no wheezes. She has no rales.  Abdominal: Soft. She exhibits no distension. There is no tenderness. There is no rebound and no guarding.  Musculoskeletal:  Bilateral feet, plantar aspect are hypersensitive to touch.  No erythema or  warmth noted. No fluctuance or induration, no wounds.  Pt bears weight but with pain on both feet.  No calf edema or tenderness.    Neurological: She is alert.  CN II-XII intact, EOMs intact, no pronator drift, grip strengths equal bilaterally; strength 5/5 in all extremities, sensation intact in all extremities; finger to nose, heel to shin, rapid alternating movements normal; gait is normal.     Skin: She is not diaphoretic.  Nursing note and vitals reviewed.   ED Course  Procedures (including critical care time) Labs Review Labs Reviewed  BASIC METABOLIC PANEL - Abnormal; Notable for the following:    Glucose, Bld 282 (*)    All other components within normal limits  CBG MONITORING, ED - Abnormal; Notable for the following:    Glucose-Capillary 266 (*)    All other components within normal limits  CBC  URINALYSIS, ROUTINE W REFLEX MICROSCOPIC (NOT AT [facility])    Imaging Review No results found. I have personally reviewed and evaluated these images and lab results as part of my medical decision-making.   EKG Interpretation None       2:16 PM Pt reports her headache has improved/resolved but is complaining about the pain in her buttock related to toradol injection.    MDM   Final diagnoses:  Myalgia  Nonintractable headache, unspecified chronicity pattern, unspecified headache type  Right foot pain   Afebrile, nontoxic patient with body aches, headache, subjective right foot plantar redness.  I do not see any erythema related to the foot but pt is having pain with myalgias and has diabetes that is new and not well controlled, will cover for cellulitis with recheck tomorrow with new PCP.  Headache resolved with PO and IM medications.  No meningeal signs or red flags for headache.  Labs remarkable for hyperglycemia without anion gap, otherwise unremarkable.  Pt declined IV.   D/C home with keflex, naprosyn, recheck with new PCP tomorrow.  Discussed result, findings, treatment, and  follow up  with patient.  Pt given return precautions.  Pt verbalizes understanding and agrees with plan.        Trixie Dredgemily Leshawn Straka, PA-C 08/23/15 1530  Loren Raceravid Yelverton, MD 08/24/15 [id number]

## 2015-08-23 NOTE — Discharge Instructions (Signed)
Read the information below.  Use the prescribed medication as directed.  Please discuss all new medications with your pharmacist.  You may return to the Emergency Department at any time for worsening condition or any new symptoms that concern you.    Please have your feet rechecked at your primary care provider appointment tomorrow morning.  Drink plenty of water and follow a diabetic diet.   If you develop increased redness, swelling, pus draining from your foot, or fevers greater than 100.4, return to the ER immediately for a recheck.     General Headache Without Cause A headache is pain or discomfort felt around the head or neck area. There are many causes and types of headaches. In some cases, the cause may not be found.  HOME CARE  Managing Pain  Take over-the-counter and prescription medicines only as told by your doctor.  Lie down in a dark, quiet room when you have a headache.  If directed, apply ice to the head and neck area:  Put ice in a plastic bag.  Place a towel between your skin and the bag.  Leave the ice on for 20 minutes, 2-3 times per day.  Use a heating pad or hot shower to apply heat to the head and neck area as told by your doctor.  Keep lights dim if bright lights bother you or make your headaches worse. Eating and Drinking  Eat meals on a regular schedule.  Lessen how much alcohol you drink.  Lessen how much caffeine you drink, or stop drinking caffeine. General Instructions  Keep all follow-up visits as told by your doctor. This is important.  Keep a journal to find out if certain things bring on headaches. For example, write down:  What you eat and drink.  How much sleep you get.  Any change to your diet or medicines.  Relax by getting a massage or doing other relaxing activities.  Lessen stress.  Sit up straight. Do not tighten (tense) your muscles.  Do not use tobacco products. This includes cigarettes, chewing tobacco, or e-cigarettes. If  you need help quitting, ask your doctor.  Exercise regularly as told by your doctor.  Get enough sleep. This often means 7-9 hours of sleep. GET HELP IF:  Your symptoms are not helped by medicine.  You have a headache that feels different than the other headaches.  You feel sick to your stomach (nauseous) or you throw up (vomit).  You have a fever. GET HELP RIGHT AWAY IF:   Your headache becomes really bad.  You keep throwing up.  You have a stiff neck.  You have trouble seeing.  You have trouble speaking.  You have pain in the eye or ear.  Your muscles are weak or you lose muscle control.  You lose your balance or have trouble walking.  You feel like you will pass out (faint) or you pass out.  You have confusion.   This information is not intended to replace advice given to you by your health care provider. Make sure you discuss any questions you have with your health care provider.   Document Released: 02/28/2008 Document Revised: 02/09/2015 Document Reviewed: 09/13/2014 Elsevier Interactive Patient Education 2016 [employer]   Musculoskeletal Pain Musculoskeletal pain is muscle and boney aches and pains. These pains can occur in any part of the body. Your caregiver may treat you without knowing the cause of the pain. They may treat you if blood or urine tests, X-rays, and other tests were  normal.  CAUSES There is often not a definite cause or reason for these pains. These pains may be caused by a type of germ (virus). The discomfort may also come from overuse. Overuse includes working out too hard when your body is not fit. Boney aches also come from weather changes. Bone is sensitive to atmospheric pressure changes. HOME CARE INSTRUCTIONS   Ask when your test results will be ready. Make sure you get your test results.  Only take over-the-counter or prescription medicines for pain, discomfort, or fever as directed by your caregiver. If you were given  medications for your condition, do not drive, operate machinery or power tools, or sign legal documents for 24 hours. Do not drink alcohol. Do not take sleeping pills or other medications that may interfere with treatment.  Continue all activities unless the activities cause more pain. When the pain lessens, slowly resume normal activities. Gradually increase the intensity and duration of the activities or exercise.  During periods of severe pain, bed rest may be helpful. Lay or sit in any position that is comfortable.  Putting ice on the injured area.  Put ice in a bag.  Place a towel between your skin and the bag.  Leave the ice on for 15 to 20 minutes, 3 to 4 times a day.  Follow up with your caregiver for continued problems and no reason can be found for the pain. If the pain becomes worse or does not go away, it may be necessary to repeat tests or do additional testing. Your caregiver may need to look further for a possible cause. SEEK IMMEDIATE MEDICAL CARE IF:  You have pain that is getting worse and is not relieved by medications.  You develop chest pain that is associated with shortness or breath, sweating, feeling sick to your stomach (nauseous), or throw up (vomit).  Your pain becomes localized to the abdomen.  You develop any new symptoms that seem different or that concern you. MAKE SURE YOU:   Understand these instructions.  Will watch your condition.  Will get help right away if you are not doing well or get worse.   This information is not intended to replace advice given to you by your health care provider. Make sure you discuss any questions you have with your health care provider.   Document Released: 05/21/2005 Document Revised: 08/13/2011 Document Reviewed: 01/23/2013 Elsevier Interactive Patient Education [employer]

## 2015-08-24 ENCOUNTER — Ambulatory Visit: Payer: Medicaid Other | Attending: Internal Medicine | Admitting: Internal Medicine

## 2015-08-24 ENCOUNTER — Encounter: Payer: Self-pay | Admitting: Internal Medicine

## 2015-08-24 VITALS — BP 144/94 | HR 98 | Temp 98.1°F | Resp 16 | Ht 65.0 in | Wt 253.2 lb

## 2015-08-24 DIAGNOSIS — Z79899 Other long term (current) drug therapy: Secondary | ICD-10-CM | POA: Insufficient documentation

## 2015-08-24 DIAGNOSIS — I159 Secondary hypertension, unspecified: Secondary | ICD-10-CM | POA: Diagnosis not present

## 2015-08-24 DIAGNOSIS — K219 Gastro-esophageal reflux disease without esophagitis: Secondary | ICD-10-CM | POA: Diagnosis not present

## 2015-08-24 DIAGNOSIS — E1140 Type 2 diabetes mellitus with diabetic neuropathy, unspecified: Secondary | ICD-10-CM

## 2015-08-24 DIAGNOSIS — Z7984 Long term (current) use of oral hypoglycemic drugs: Secondary | ICD-10-CM | POA: Diagnosis not present

## 2015-08-24 DIAGNOSIS — I10 Essential (primary) hypertension: Secondary | ICD-10-CM | POA: Diagnosis not present

## 2015-08-24 DIAGNOSIS — J45909 Unspecified asthma, uncomplicated: Secondary | ICD-10-CM | POA: Diagnosis not present

## 2015-08-24 DIAGNOSIS — Z87891 Personal history of nicotine dependence: Secondary | ICD-10-CM | POA: Insufficient documentation

## 2015-08-24 DIAGNOSIS — E1165 Type 2 diabetes mellitus with hyperglycemia: Secondary | ICD-10-CM | POA: Diagnosis present

## 2015-08-24 LAB — POCT URINALYSIS DIPSTICK
BILIRUBIN UA: NEGATIVE
Blood, UA: NEGATIVE
Glucose, UA: 500
LEUKOCYTES UA: NEGATIVE
Nitrite, UA: NEGATIVE
Protein, UA: NEGATIVE
SPEC GRAV UA: 1.01
Urobilinogen, UA: 0.2
pH, UA: 6

## 2015-08-24 LAB — POCT GLYCOSYLATED HEMOGLOBIN (HGB A1C): HEMOGLOBIN A1C: 10.1

## 2015-08-24 LAB — GLUCOSE, POCT (MANUAL RESULT ENTRY)
POC Glucose: 342 mg/dl — AB (ref 70–99)
POC Glucose: 375 mg/dl — AB (ref 70–99)

## 2015-08-24 MED ORDER — TRUEPLUS LANCETS 26G MISC: 1.0000 | Freq: Three times a day (TID) | Status: DC | PRN

## 2015-08-24 MED ORDER — TRUE METRIX GO GLUCOSE METER W/DEVICE KIT: 1.0000 | PACK | Freq: Three times a day (TID) | Status: DC | PRN

## 2015-08-24 MED ORDER — INSULIN ASPART 100 UNIT/ML ~~LOC~~ SOLN
15.0000 [IU] | Freq: Once | SUBCUTANEOUS | Status: AC
  Administered 2015-08-24: 15 [IU] via SUBCUTANEOUS

## 2015-08-24 MED ORDER — METFORMIN HCL 1000 MG PO TABS: 1000.0000 mg | ORAL_TABLET | Freq: Two times a day (BID) | ORAL | Status: DC

## 2015-08-24 MED ORDER — GABAPENTIN 100 MG PO CAPS: 100.0000 mg | ORAL_CAPSULE | Freq: Three times a day (TID) | ORAL | Status: DC

## 2015-08-24 MED ORDER — GLUCOSE BLOOD VI STRP: ORAL_STRIP | Status: DC

## 2015-08-24 MED ORDER — GLYBURIDE 5 MG PO TABS: 5.0000 mg | ORAL_TABLET | Freq: Every day | ORAL | Status: DC

## 2015-08-24 NOTE — Progress Notes (Signed)
Diabetes follow up Took medication this am States she is on antibiotics for a "skin infection on my foot."

## 2015-08-24 NOTE — Progress Notes (Signed)
Sarah Garrison, is a 35 y.o. female  ZOX:[id number]  WJX:[id number]  DOB - Mar 26, 1981  CC:  Chief Complaint  Patient presents with  . Diabetes       HPI: Sarah Garrison is a 35 y.o. female here today to establish medical care.  Patient has a history of diabetes, recently diagnosed about a month ago by the ER started on metformin as well as hypertension, well-controlled asthma as well as GERD. She has been in the ED for about 6 times in the last 3 months alone.   Comes to our clinic for further workup as well as primary care.  She states that ever since she started the metformin. She's noticed some Dry skin. Also, abdominal discomfort, loose bowels, myalgias and fatigue. She was seen in the ED yesterday with a gave her prescriptions, but she was so tired yesterday that she went home and slept all day and did not pick up her prescriptions until this morning.  She complains of significant numbness and tingling in bilateral feet.  She denies any polyuria, polydipsia, polyphagia, but doesn't mention weight gain.  She states she didn't take her metformin yesterday, but she did take it this morning.   Patient has No headache, No chest pain, No abdominal pain - No Nausea, no Cough - SOB.  No Known Allergies Past Medical History  Diagnosis Date  . Asthma   . GERD (gastroesophageal reflux disease)   . Diabetes mellitus without complication (HCC)   . Hypertension    Current Outpatient Prescriptions on File Prior to Visit  Medication Sig Dispense Refill  . albuterol (PROVENTIL HFA;VENTOLIN HFA) 108 (90 Base) MCG/ACT inhaler Inhale 2 puffs into the lungs every 6 (six) hours as needed for wheezing or shortness of breath.    Marland Kitchen amLODipine (NORVASC) 2.5 MG tablet Take 2.5 mg by mouth daily.    . cephALEXin (KEFLEX) 500 MG capsule Take 1 capsule (500 mg total) by mouth 4 (four) times daily. 28 capsule 0  . metFORMIN (GLUCOPHAGE) 500 MG tablet Take 1 tablet (500 mg total) by mouth 2 (two) times  daily with a meal. 60 tablet 0  . naproxen (NAPROSYN) 500 MG tablet Take 1 tablet (500 mg total) by mouth 2 (two) times daily as needed for mild pain, moderate pain or headache. 14 tablet 0  . ondansetron (ZOFRAN ODT) 4 MG disintegrating tablet Take 1 tablet (4 mg total) by mouth every 8 (eight) hours as needed for nausea or vomiting. 20 tablet 0  . pantoprazole (PROTONIX) 40 MG tablet Take 1 tablet (40 mg total) by mouth daily. 30 tablet 1   No current facility-administered medications on file prior to visit.   Family History  Problem Relation Age of Onset  . Asthma Mother   . Asthma Father    Social History   Social History  . Marital Status: Single    Spouse Name: N/A  . Number of Children: N/A  . Years of Education: N/A   Occupational History  . Not on file.   Social History Main Topics  . Smoking status: Former Smoker -- 0.25 packs/day    Types: Cigarettes    Quit date: 11/02/2013  . Smokeless tobacco: Never Used  . Alcohol Use: No  . Drug Use: No  . Sexual Activity: Yes    Birth Control/ Protection: Surgical   Other Topics Concern  . Not on file   Social History Narrative    Review of Systems: Constitutional: Negative for fever, chills, diaphoresis, activity change,  appetite change and fatigue. HENT: Negative for ear pain, nosebleeds, congestion, facial swelling, rhinorrhea, neck pain, neck stiffness and ear discharge.  Eyes: Negative for pain, discharge, redness, itching and visual disturbance.  Blurry vision at times.  Eyes exam about 1.5 years ago normal per pt. Respiratory: Negative for cough, choking, chest tightness, shortness of breath, wheezing and stridor.  Cardiovascular: Negative for chest pain, palpitations and leg swelling. Gastrointestinal: Negative for abdominal distention. Genitourinary: Negative for dysuria, urgency, frequency, hematuria, flank pain, decreased urine volume, difficulty urinating and dyspareunia.  Musculoskeletal: Negative for back  pain, joint swelling, arthralgia and gait problem. Neurological: Negative for dizziness, tremors, seizures, syncope, facial asymmetry, speech difficulty, weakness, light-headedness,  headaches.  Extreme numbness/tingling of bilateral feet/toes, burning/hot. Hematological: Negative for adenopathy. Does not bruise/bleed easily. Psychiatric/Behavioral: Negative for hallucinations, behavioral problems, confusion, dysphoric mood, decreased concentration and agitation.    Objective:  144/94  p98 rr16 99% ra  Physical Exam: Constitutional: Patient appears well-developed and well-nourished. No distress. Obese, non-toxic, aaox 3 HENT: Normocephalic, atraumatic, External right and left ear normal. Oropharynx is clear and moist.  Eyes: Conjunctivae and EOM are normal. PERRL, no scleral icterus. Neck: Normal ROM. Neck supple. No JVD. No tracheal deviation. No thyromegaly. CVS: RRR, S1/S2 +, no murmurs, no gallops, no carotid bruit.  Pulmonary: Effort and breath sounds normal, no stridor, rhonchi, wheezes, rales.  Abdominal: Soft. Obese. BS +, no distension, tenderness, rebound or guarding.  Musculoskeletal: Normal range of motion. No edema and no tenderness.  Lymphadenopathy: No lymphadenopathy noted, cervical. Neuro: Alert. Normal reflexes, muscle tone coordination. No cranial nerve deficit grossly. Skin: Skin is warm and dry. No rash noted. Not diaphoretic. No erythema. No pallor.  +ashy dry upper extremities.  Bilateral foot exam, good dorsalis pedis and posterior tibial pulses 2+.  No ulcerations/erythem/edema note bilat le.  Psychiatric: Normal mood and affect. Behavior, judgment, thought content normal.  Lab Results  Component Value Date   WBC 7.7 08/23/2015   HGB 12.6 08/23/2015   HCT 38.1 08/23/2015   MCV 85.2 08/23/2015   PLT 331 08/23/2015   Lab Results  Component Value Date   CREATININE 0.76 08/23/2015   BUN 13 08/23/2015   NA 137 08/23/2015   K 4.5 08/23/2015   CL 103 08/23/2015     CO2 23 08/23/2015    Lab Results  Component Value Date   HGBA1C 10.1 08/24/2015   Lipid Panel  No results found for: CHOL, TRIG, HDL, CHOLHDL, VLDL, LDLCALC     Assessment and plan:   1. Type 2 diabetes mellitus with hyperglycemia, without long-term current use of insulin (HCC)  - Glucose (CBG) 375,  - HgB A1c 10.1 - insulin aspart (novoLOG) injection 15 Units; Inject 0.15 mLs (15 Units total) into the skin once. - increased metformin to 1000mg  po bid, dw pt gi side effects/weight loss associations, - add glyburide 5mg  po qd - glucometer/strips/lancets ordered, pt will pick up at her pharmacy and make appt w/ Stacey/pharm clinic 1-2 wks to go over use/follow glucose  2. Type 2 diabetes mellitus with diabetic neuropathy, unspecified long term insulin use status (HCC) - trial neuropathy, no findings of cellulitis  3. Morbid obesity, unspecified obesity type (HCC) -info given on ada diet, exercise regimen, carb control - encouraged increase activity  4. Secondary hypertension, unspecified - continue norvasc for now, slight ellavation today may be due to pain; chk at next visit    Fu 2-3 months.  Needs papsmear.  The patient was given clear instructions to  go to ER or return to medical center if symptoms don't improve, worsen or new problems develop. The patient verbalized understanding. The patient was told to call to get lab results if they haven't heard anything in the next week.      Pete Glatter, MD, MBA/MHA [facility] [place], [place] [phone]   08/24/2015, 12:11 PM

## 2015-08-24 NOTE — Patient Instructions (Signed)
- make an appt w/ [facility] asap to show you how to use the machine.  - Diabetes Mellitus and Food It is important for you to manage your blood sugar (glucose) level. Your blood glucose level can be greatly affected by what you eat. Eating healthier foods in the appropriate amounts throughout the day at about the same time each day will help you control your blood glucose level. It can also help slow or prevent worsening of your diabetes mellitus. Healthy eating may even help you improve the level of your blood pressure and reach or maintain a healthy weight.  General recommendations for healthful eating and cooking habits include:  Eating meals and snacks regularly. Avoid going long periods of time without eating to lose weight.  Eating a diet that consists mainly of plant-based foods, such as fruits, vegetables, nuts, legumes, and whole grains.  Using low-heat cooking methods, such as baking, instead of high-heat cooking methods, such as deep frying. Work with your dietitian to make sure you understand how to use the Nutrition Facts information on food labels. HOW CAN FOOD AFFECT ME? Carbohydrates Carbohydrates affect your blood glucose level more than any other type of food. Your dietitian will help you determine how many carbohydrates to eat at each meal and teach you how to count carbohydrates. Counting carbohydrates is important to keep your blood glucose at a healthy level, especially if you are using insulin or taking certain medicines for diabetes mellitus. Alcohol Alcohol can cause sudden decreases in blood glucose (hypoglycemia), especially if you use insulin or take certain medicines for diabetes mellitus. Hypoglycemia can be a life-threatening condition. Symptoms of hypoglycemia (sleepiness, dizziness, and disorientation) are similar to symptoms of having too much alcohol.  If your health care provider has given you approval to drink alcohol, do so in moderation and use the  following guidelines:  Women should not have more than one drink per day, and men should not have more than two drinks per day. One drink is equal to:  12 oz of beer.  5 oz of wine.  1 oz of hard liquor.  Do not drink on an empty stomach.  Keep yourself hydrated. Have water, diet soda, or unsweetened iced tea.  Regular soda, juice, and other mixers might contain a lot of carbohydrates and should be counted. WHAT FOODS ARE NOT RECOMMENDED? As you make food choices, it is important to remember that all foods are not the same. Some foods have fewer nutrients per serving than other foods, even though they might have the same number of calories or carbohydrates. It is difficult to get your body what it needs when you eat foods with fewer nutrients. Examples of foods that you should avoid that are high in calories and carbohydrates but low in nutrients include:  Trans fats (most processed foods list trans fats on the Nutrition Facts label).  Regular soda.  Juice.  Candy.  Sweets, such as cake, pie, doughnuts, and cookies.  Fried foods. WHAT FOODS CAN I EAT? Eat nutrient-rich foods, which will nourish your body and keep you healthy. The food you should eat also will depend on several factors, including:  The calories you need.  The medicines you take.  Your weight.  Your blood glucose level.  Your blood pressure level.  Your cholesterol level. You should eat a variety of foods, including:  Protein.  Lean cuts of meat.  Proteins low in saturated fats, such as fish, egg whites, and beans. Avoid processed meats.  Fruits  and vegetables.  Fruits and vegetables that may help control blood glucose levels, such as apples, mangoes, and yams.  Dairy products.  Choose fat-free or low-fat dairy products, such as milk, yogurt, and cheese.  Grains, bread, pasta, and rice.  Choose whole grain products, such as multigrain bread, whole oats, and brown rice. These foods may help  control blood pressure.  Fats.  Foods containing healthful fats, such as nuts, avocado, olive oil, canola oil, and fish. DOES EVERYONE WITH DIABETES MELLITUS HAVE THE SAME MEAL PLAN? Because every person with diabetes mellitus is different, there is not one meal plan that works for everyone. It is very important that you meet with a dietitian who will help you create a meal plan that is just right for you.   This information is not intended to replace advice given to you by your health care provider. Make sure you discuss any questions you have with your health care provider.   Document Released: 02/15/2005 Document Revised: 06/11/2014 Document Reviewed: 04/17/2013 Elsevier Interactive Patient Education 2016 [employer].   Diabetes and Exercise Exercising regularly is important. It is not just about losing weight. It has many health benefits, such as:  Improving your overall fitness, flexibility, and endurance.  Increasing your bone density.  Helping with weight control.  Decreasing your body fat.  Increasing your muscle strength.  Reducing stress and tension.  Improving your overall health. People with diabetes who exercise gain additional benefits because exercise:  Reduces appetite.  Improves the body's use of blood sugar (glucose).  Helps lower or control blood glucose.  Decreases blood pressure.  Helps control blood lipids (such as cholesterol and triglycerides).  Improves the body's use of the hormone insulin by:  Increasing the body's insulin sensitivity.  Reducing the body's insulin needs.  Decreases the risk for heart disease because exercising:  Lowers cholesterol and triglycerides levels.  Increases the levels of good cholesterol (such as high-density lipoproteins [HDL]) in the body.  Lowers blood glucose levels. YOUR ACTIVITY PLAN  Choose an activity that you enjoy, and set realistic goals. To exercise safely, you should begin practicing any new  physical activity slowly, and gradually increase the intensity of the exercise over time. Your health care provider or diabetes educator can help create an activity plan that works for you. General recommendations include:  Encouraging children to engage in at least 60 minutes of physical activity each day.  Stretching and performing strength training exercises, such as yoga or weight lifting, at least 2 times per week.  Performing a total of at least 150 minutes of moderate-intensity exercise each week, such as brisk walking or water aerobics.  Exercising at least 3 days per week, making sure you allow no more than 2 consecutive days to pass without exercising.  Avoiding long periods of inactivity (90 minutes or more). When you have to spend an extended period of time sitting down, take frequent breaks to walk or stretch. RECOMMENDATIONS FOR EXERCISING WITH TYPE 1 OR TYPE 2 DIABETES   Check your blood glucose before exercising. If blood glucose levels are greater than 240 mg/dL, check for urine ketones. Do not exercise if ketones are present.  Avoid injecting insulin into areas of the body that are going to be exercised. For example, avoid injecting insulin into:  The arms when playing tennis.  The legs when jogging.  Keep a record of:  Food intake before and after you exercise.  Expected peak times of insulin action.  Blood glucose levels before  and after you exercise.  The type and amount of exercise you have done.  Review your records with your health care provider. Your health care provider will help you to develop guidelines for adjusting food intake and insulin amounts before and after exercising.  If you take insulin or oral hypoglycemic agents, watch for signs and symptoms of hypoglycemia. They include:  Dizziness.  Shaking.  Sweating.  Chills.  Confusion.  Drink plenty of water while you exercise to prevent dehydration or heat stroke. Body water is lost during  exercise and must be replaced.  Talk to your health care provider before starting an exercise program to make sure it is safe for you. Remember, almost any type of activity is better than none.   This information is not intended to replace advice given to you by your health care provider. Make sure you discuss any questions you have with your health care provider.   Document Released: 08/11/2003 Document Revised: 10/05/2014 Document Reviewed: 10/28/2012 Elsevier Interactive Patient Education 2016 Elsevier Inc.   - Hypertension Hypertension, commonly called high blood pressure, is when the force of blood pumping through your arteries is too strong. Your arteries are the blood vessels that carry blood from your heart throughout your body. A blood pressure reading consists of a higher number over a lower number, such as 110/72. The higher number (systolic) is the pressure inside your arteries when your heart pumps. The lower number (diastolic) is the pressure inside your arteries when your heart relaxes. Ideally you want your blood pressure below 120/80. Hypertension forces your heart to work harder to pump blood. Your arteries may become narrow or stiff. Having untreated or uncontrolled hypertension can cause heart attack, stroke, kidney disease, and other problems. RISK FACTORS Some risk factors for high blood pressure are controllable. Others are not.  Risk factors you cannot control include:   Race. You may be at higher risk if you are African American.  Age. Risk increases with age.  Gender. Men are at higher risk than women before age 8 years. After age 63, women are at higher risk than men. Risk factors you can control include:  Not getting enough exercise or physical activity.  Being overweight.  Getting too much fat, sugar, calories, or salt in your diet.  Drinking too much alcohol. SIGNS AND SYMPTOMS Hypertension does not usually cause signs or symptoms. Extremely high blood  pressure (hypertensive crisis) may cause headache, anxiety, shortness of breath, and nosebleed. DIAGNOSIS To check if you have hypertension, your health care provider will measure your blood pressure while you are seated, with your arm held at the level of your heart. It should be measured at least twice using the same arm. Certain conditions can cause a difference in blood pressure between your right and left arms. A blood pressure reading that is higher than normal on one occasion does not mean that you need treatment. If it is not clear whether you have high blood pressure, you may be asked to return on a different day to have your blood pressure checked again. Or, you may be asked to monitor your blood pressure at home for 1 or more weeks. TREATMENT Treating high blood pressure includes making lifestyle changes and possibly taking medicine. Living a healthy lifestyle can help lower high blood pressure. You may need to change some of your habits. Lifestyle changes may include:  Following the DASH diet. This diet is high in fruits, vegetables, and whole grains. It is low in salt, red  meat, and added sugars.  Keep your sodium intake below 2,300 mg per day.  Getting at least 30-45 minutes of aerobic exercise at least 4 times per week.  Losing weight if necessary.  Not smoking.  Limiting alcoholic beverages.  Learning ways to reduce stress. Your health care provider may prescribe medicine if lifestyle changes are not enough to get your blood pressure under control, and if one of the following is true:  You are 53-12 years of age and your systolic blood pressure is above 140.  You are 45 years of age or older, and your systolic blood pressure is above 150.  Your diastolic blood pressure is above 90.  You have diabetes, and your systolic blood pressure is over 140 or your diastolic blood pressure is over 90.  You have kidney disease and your blood pressure is above 140/90.  You have  heart disease and your blood pressure is above 140/90. Your personal target blood pressure may vary depending on your medical conditions, your age, and other factors. HOME CARE INSTRUCTIONS  Have your blood pressure rechecked as directed by your health care provider.   Take medicines only as directed by your health care provider. Follow the directions carefully. Blood pressure medicines must be taken as prescribed. The medicine does not work as well when you skip doses. Skipping doses also puts you at risk for problems.  Do not smoke.   Monitor your blood pressure at home as directed by your health care provider. SEEK MEDICAL CARE IF:   You think you are having a reaction to medicines taken.  You have recurrent headaches or feel dizzy.  You have swelling in your ankles.  You have trouble with your vision. SEEK IMMEDIATE MEDICAL CARE IF:  You develop a severe headache or confusion.  You have unusual weakness, numbness, or feel faint.  You have severe chest or abdominal pain.  You vomit repeatedly.  You have trouble breathing. MAKE SURE YOU:   Understand these instructions.  Will watch your condition.  Will get help right away if you are not doing well or get worse.   This information is not intended to replace advice given to you by your health care provider. Make sure you discuss any questions you have with your health care provider.   Document Released: 05/21/2005 Document Revised: 10/05/2014 Document Reviewed: 03/13/2013 Elsevier Interactive Patient Education 2016 Elsevier Inc.  - DASH Eating Plan DASH stands for "Dietary Approaches to Stop Hypertension." The DASH eating plan is a healthy eating plan that has been shown to reduce high blood pressure (hypertension). Additional health benefits may include reducing the risk of type 2 diabetes mellitus, heart disease, and stroke. The DASH eating plan may also help with weight loss. WHAT DO I NEED TO KNOW ABOUT THE  DASH EATING PLAN? For the DASH eating plan, you will follow these general guidelines:  Choose foods with a percent daily value for sodium of less than 5% (as listed on the food label).  Use salt-free seasonings or herbs instead of table salt or sea salt.  Check with your health care provider or pharmacist before using salt substitutes.  Eat lower-sodium products, often labeled as "lower sodium" or "no salt added."  Eat fresh foods.  Eat more vegetables, fruits, and low-fat dairy products.  Choose whole grains. Look for the word "whole" as the first word in the ingredient list.  Choose fish and skinless chicken or [place] more often than red meat. Limit fish, poultry, and meat to  6 oz (170 g) each day.  Limit sweets, desserts, sugars, and sugary drinks.  Choose heart-healthy fats.  Limit cheese to 1 oz (28 g) per day.  Eat more home-cooked food and less restaurant, buffet, and fast food.  Limit fried foods.  Cook foods using methods other than frying.  Limit canned vegetables. If you do use them, rinse them well to decrease the sodium.  When eating at a restaurant, ask that your food be prepared with less salt, or no salt if possible. WHAT FOODS CAN I EAT? Seek help from a dietitian for individual calorie needs. Grains Whole grain or whole wheat bread. Brown rice. Whole grain or whole wheat pasta. Quinoa, bulgur, and whole grain cereals. Low-sodium cereals. Corn or whole wheat flour tortillas. Whole grain cornbread. Whole grain crackers. Low-sodium crackers. Vegetables Fresh or frozen vegetables (raw, steamed, roasted, or grilled). Low-sodium or reduced-sodium tomato and vegetable juices. Low-sodium or reduced-sodium tomato sauce and paste. Low-sodium or reduced-sodium canned vegetables.  Fruits All fresh, canned (in natural juice), or frozen fruits. Meat and Other Protein Products Ground beef (85% or leaner), grass-fed beef, or beef trimmed of fat. Skinless chicken or  [place]. Ground chicken or [place]. Pork trimmed of fat. All fish and seafood. Eggs. Dried beans, peas, or lentils. Unsalted nuts and seeds. Unsalted canned beans. Dairy Low-fat dairy products, such as skim or 1% milk, 2% or reduced-fat cheeses, low-fat ricotta or cottage cheese, or plain low-fat yogurt. Low-sodium or reduced-sodium cheeses. Fats and Oils Tub margarines without trans fats. Light or reduced-fat mayonnaise and salad dressings (reduced sodium). Avocado. Safflower, olive, or canola oils. Natural peanut or almond butter. Other Unsalted popcorn and pretzels. The items listed above may not be a complete list of recommended foods or beverages. Contact your dietitian for more options. WHAT FOODS ARE NOT RECOMMENDED? Grains White bread. White pasta. White rice. Refined cornbread. Bagels and croissants. Crackers that contain trans fat. Vegetables Creamed or fried vegetables. Vegetables in a cheese sauce. Regular canned vegetables. Regular canned tomato sauce and paste. Regular tomato and vegetable juices. Fruits Dried fruits. Canned fruit in light or heavy syrup. Fruit juice. Meat and Other Protein Products Fatty cuts of meat. Ribs, chicken wings, bacon, sausage, bologna, salami, chitterlings, fatback, hot dogs, bratwurst, and packaged luncheon meats. Salted nuts and seeds. Canned beans with salt. Dairy Whole or 2% milk, cream, half-and-half, and cream cheese. Whole-fat or sweetened yogurt. Full-fat cheeses or blue cheese. Nondairy creamers and whipped toppings. Processed cheese, cheese spreads, or cheese curds. Condiments Onion and garlic salt, seasoned salt, table salt, and sea salt. Canned and packaged gravies. Worcestershire sauce. Tartar sauce. Barbecue sauce. Teriyaki sauce. Soy sauce, including reduced sodium. Steak sauce. Fish sauce. Oyster sauce. Cocktail sauce. Horseradish. Ketchup and mustard. Meat flavorings and tenderizers. Bouillon cubes. Hot sauce. Tabasco sauce. Marinades.  Taco seasonings. Relishes. Fats and Oils Butter, stick margarine, lard, shortening, ghee, and bacon fat. Coconut, palm kernel, or palm oils. Regular salad dressings. Other Pickles and olives. Salted popcorn and pretzels. The items listed above may not be a complete list of foods and beverages to avoid. Contact your dietitian for more information. WHERE CAN I FIND MORE INFORMATION? National Heart, Lung, and Blood Institute: [employer].itwww.nhlbi.nih.gov/health/health-topics/topics/dash/   This information is not intended to replace advice given to you by your health care provider. Make sure you discuss any questions you have with your health care provider.   Document Released: 05/10/2011 Document Revised: 06/11/2014 Document Reviewed: 03/25/2013 Elsevier Interactive Patient Education [employer] Elsevier Inc.

## 2015-08-25 ENCOUNTER — Ambulatory Visit: Payer: Medicaid Other | Attending: Internal Medicine | Admitting: Pharmacist

## 2015-08-25 DIAGNOSIS — G629 Polyneuropathy, unspecified: Secondary | ICD-10-CM | POA: Diagnosis not present

## 2015-08-25 DIAGNOSIS — E119 Type 2 diabetes mellitus without complications: Secondary | ICD-10-CM | POA: Insufficient documentation

## 2015-08-25 DIAGNOSIS — R351 Nocturia: Secondary | ICD-10-CM | POA: Insufficient documentation

## 2015-08-25 DIAGNOSIS — E1165 Type 2 diabetes mellitus with hyperglycemia: Secondary | ICD-10-CM

## 2015-08-25 MED ORDER — GLUCOSE BLOOD VI STRP: ORAL_STRIP | Status: DC

## 2015-08-25 MED ORDER — ACCU-CHEK AVIVA PLUS W/DEVICE KIT: PACK | Status: DC

## 2015-08-25 MED ORDER — METFORMIN HCL 1000 MG PO TABS
1000.0000 mg | ORAL_TABLET | Freq: Two times a day (BID) | ORAL | Status: DC
Start: 2015-08-25 — End: 2016-02-01

## 2015-08-25 MED ORDER — GLYBURIDE 5 MG PO TABS: 5.0000 mg | ORAL_TABLET | Freq: Every day | ORAL | Status: DC

## 2015-08-25 MED ORDER — GABAPENTIN 100 MG PO CAPS: 100.0000 mg | ORAL_CAPSULE | Freq: Three times a day (TID) | ORAL | Status: DC

## 2015-08-25 MED ORDER — ACCU-CHEK SOFT TOUCH LANCETS MISC: Status: DC

## 2015-08-25 NOTE — Progress Notes (Signed)
S:    Patient arrives in good spirits.  Presents for diabetes evaluation, education, and management at the request of Dr. Julien NordmannLangeland. Patient was referred on 08/25/15.  Patient was last seen by Primary Care Provider on 08/25/15.   Patient denies adherence with medications. She was unable to get her medications filled because Dr. Julien NordmannLangeland was not listed as a Medicaid prescriber.  Patient denies hypoglycemic events.  Patient reported dietary habits: doesn't follow a particular diet.  Patient reported exercise habits: none   Patient reports nocturia.  Patient reports neuropathy. Patient denies visual changes. Patient denies self foot exams.   Patient complains that her finger hurts from getting her blood glucose checked yesterday. She also complains of tingling in all of her fingers and her feet. She wants to know what she should do about this.    O:  Lab Results  Component Value Date   HGBA1C 10.1 08/24/2015   There were no vitals filed for this visit.   A/P: Diabetes currently uncontrolled based on A1c of 10.1. Patient denies hypoglycemic events and is able to verbalize appropriate hypoglycemia management plan. Patient denies adherence with medication. Control is suboptimal due to nonadherence.  Continued all medications as prescribed but reordered them so that they would be covered by Medicaid. Instructed patient to pick them up and to start them. 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 Told patient that gabapentin was prescribed for the nerve pain so try that to see if it helps. Patient verbalized understanding. Patient was not otherwise engaged in the conversation so will discuss diabetes education at next visit.   Next A1C anticipated June 2017.    Written patient instructions provided.  Total time in face to face counseling 20 minutes.   Follow up in Pharmacist Clinic Visit in 2 weeks for [facility] log review.    Patient seen with Theresa MulliganMartin Shaughnessy, PharmD Candidate

## 2015-08-25 NOTE — Patient Instructions (Signed)
[facility]  Ask the pharmacist there to show you how to use it.  If they cannot teach you, bring it back to me and I can teach you  Check your blood sugar once a day first thing in the morning before you eat  Always bring your meter or a log book of your blood sugars to each visit  Come back and see me in 2 weeks  Blood Glucose Monitoring, Adult Monitoring your blood glucose (also know as blood sugar) helps you to manage your diabetes. It also helps you and your health care provider monitor your diabetes and determine how well your treatment plan is working. WHY SHOULD YOU MONITOR YOUR BLOOD GLUCOSE?  It can help you understand how food, exercise, and medicine affect your blood glucose.  It allows you to know what your blood glucose is at any given moment. You can quickly tell if you are having low blood glucose (hypoglycemia) or high blood glucose (hyperglycemia).  It can help you and your health care provider know how to adjust your medicines.  It can help you understand how to manage an illness or adjust medicine for exercise. WHEN SHOULD YOU TEST? Your health care provider will help you decide how often you should check your blood glucose. This may depend on the type of diabetes you have, your diabetes control, or the types of medicines you are taking. Be sure to write down all of your blood glucose readings so that this information can be reviewed with your health care provider. See below for examples of testing times that your health care provider may suggest. Type 1 Diabetes  Test at least 2 times per day if your diabetes is well controlled, if you are using an insulin pump, or if you perform multiple daily injections.  If your diabetes is not well controlled or if you are sick, you may need to test more often.  It is a good idea to also test:  Before every insulin injection.  Before and after exercise.  Between meals and 2 hours  after a meal.  Occasionally between 2:00 a.m. and 3:00 a.m. Type 2 Diabetes  If you are taking insulin, test at least 2 times per day. However, it is best to test before every insulin injection.  If you take medicines by mouth (orally), test 2 times a day.  If you are on a controlled diet, test once a day.  If your diabetes is not well controlled or if you are sick, you may need to monitor more often. HOW TO MONITOR YOUR BLOOD GLUCOSE Supplies Needed  Blood glucose meter.  Test strips for your meter. Each meter has its own strips. You must use the strips that go with your own meter.  A pricking needle (lancet).  A device that holds the lancet (lancing device).  A journal or log book to write down your results. Procedure  Wash your hands with soap and water. Alcohol is not preferred.  Prick the side of your finger (not the tip) with the lancet.  Gently milk the finger until a small drop of blood appears.  Follow the instructions that come with your meter for inserting the test strip, applying blood to the strip, and using your blood glucose meter. Other Areas to Get Blood for Testing Some meters allow you to use other areas of your body (other than your finger) to test your blood. These areas are called alternative sites. The most  common alternative sites are:  The forearm.  The thigh.  The back area of the lower leg.  The palm of the hand. The blood flow in these areas is slower. Therefore, the blood glucose values you get may be delayed, and the numbers are different from what you would get from your fingers. Do not use alternative sites if you think you are having hypoglycemia. Your reading will not be accurate. Always use a finger if you are having hypoglycemia. Also, if you cannot feel your lows (hypoglycemia unawareness), always use your fingers for your blood glucose checks. ADDITIONAL TIPS FOR GLUCOSE MONITORING  Do not reuse lancets.  Always carry your supplies  with you.  All blood glucose meters have a 24-hour "hotline" number to call if you have questions or need help.  Adjust (calibrate) your blood glucose meter with a control solution after finishing a few boxes of strips. BLOOD GLUCOSE RECORD KEEPING It is a good idea to keep a daily record or log of your blood glucose readings. Most glucose meters, if not all, keep your glucose records stored in the meter. Some meters come with the ability to download your records to your home computer. Keeping a record of your blood glucose readings is especially helpful if you are wanting to look for patterns. Make notes to go along with the blood glucose readings because you might forget what happened at that exact time. Keeping good records helps you and your health care provider to work together to achieve good diabetes management.    This information is not intended to replace advice given to you by your health care provider. Make sure you discuss any questions you have with your health care provider.   Document Released: 05/24/2003 Document Revised: 06/11/2014 Document Reviewed: 10/13/2012 Elsevier Interactive Patient Education [employer]

## 2015-08-29 ENCOUNTER — Encounter (HOSPITAL_COMMUNITY): Payer: Self-pay | Admitting: Emergency Medicine

## 2015-08-29 DIAGNOSIS — Z7984 Long term (current) use of oral hypoglycemic drugs: Secondary | ICD-10-CM | POA: Diagnosis not present

## 2015-08-29 DIAGNOSIS — Z3202 Encounter for pregnancy test, result negative: Secondary | ICD-10-CM | POA: Insufficient documentation

## 2015-08-29 DIAGNOSIS — Z792 Long term (current) use of antibiotics: Secondary | ICD-10-CM | POA: Diagnosis not present

## 2015-08-29 DIAGNOSIS — I10 Essential (primary) hypertension: Secondary | ICD-10-CM | POA: Diagnosis not present

## 2015-08-29 DIAGNOSIS — L539 Erythematous condition, unspecified: Secondary | ICD-10-CM | POA: Diagnosis not present

## 2015-08-29 DIAGNOSIS — E119 Type 2 diabetes mellitus without complications: Secondary | ICD-10-CM | POA: Diagnosis not present

## 2015-08-29 DIAGNOSIS — K219 Gastro-esophageal reflux disease without esophagitis: Secondary | ICD-10-CM | POA: Diagnosis not present

## 2015-08-29 DIAGNOSIS — Z79899 Other long term (current) drug therapy: Secondary | ICD-10-CM | POA: Insufficient documentation

## 2015-08-29 DIAGNOSIS — R197 Diarrhea, unspecified: Secondary | ICD-10-CM | POA: Diagnosis not present

## 2015-08-29 DIAGNOSIS — J45909 Unspecified asthma, uncomplicated: Secondary | ICD-10-CM | POA: Diagnosis not present

## 2015-08-29 DIAGNOSIS — R1110 Vomiting, unspecified: Secondary | ICD-10-CM | POA: Insufficient documentation

## 2015-08-29 DIAGNOSIS — E669 Obesity, unspecified: Secondary | ICD-10-CM | POA: Insufficient documentation

## 2015-08-29 DIAGNOSIS — Z9851 Tubal ligation status: Secondary | ICD-10-CM | POA: Diagnosis not present

## 2015-08-29 DIAGNOSIS — R1013 Epigastric pain: Secondary | ICD-10-CM | POA: Diagnosis not present

## 2015-08-29 DIAGNOSIS — Z87891 Personal history of nicotine dependence: Secondary | ICD-10-CM | POA: Insufficient documentation

## 2015-08-29 MED ORDER — ONDANSETRON 4 MG PO TBDP
ORAL_TABLET | ORAL | Status: DC
Start: 2015-08-29 — End: 2015-08-30
  Filled 2015-08-29: qty 2

## 2015-08-29 MED ORDER — ONDANSETRON 4 MG PO TBDP
8.0000 mg | ORAL_TABLET | Freq: Once | ORAL | Status: AC
  Administered 2015-08-29: 8 mg via ORAL

## 2015-08-29 NOTE — ED Notes (Signed)
Pt. reports emesis with diarrhea and generalized abdominal pain onset today , denies fever or chills.

## 2015-08-30 ENCOUNTER — Emergency Department (HOSPITAL_COMMUNITY)
Admission: EM | Admit: 2015-08-30 | Discharge: 2015-08-30 | Disposition: A | Discharge status: Home / Self Care | Payer: Medicaid Other | Attending: Physician Assistant | Admitting: Physician Assistant

## 2015-08-30 DIAGNOSIS — R1013 Epigastric pain: Secondary | ICD-10-CM

## 2015-08-30 LAB — CBC
HCT: 39.3 % (ref 36.0–46.0)
Hemoglobin: 13.2 g/dL (ref 12.0–15.0)
MCH: 29 pg (ref 26.0–34.0)
MCHC: 33.6 g/dL (ref 30.0–36.0)
MCV: 86.4 fL (ref 78.0–100.0)
PLATELETS: 320 10*3/uL (ref 150–400)
RBC: 4.55 MIL/uL (ref 3.87–5.11)
RDW: 12.9 % (ref 11.5–15.5)
WBC: 7.5 10*3/uL (ref 4.0–10.5)

## 2015-08-30 LAB — COMPREHENSIVE METABOLIC PANEL
ALK PHOS: 83 U/L (ref 38–126)
ALT: 20 U/L (ref 14–54)
AST: 19 U/L (ref 15–41)
Albumin: 3.5 g/dL (ref 3.5–5.0)
Anion gap: 9 (ref 5–15)
BILIRUBIN TOTAL: 0.5 mg/dL (ref 0.3–1.2)
BUN: 16 mg/dL (ref 6–20)
CALCIUM: 9.1 mg/dL (ref 8.9–10.3)
CO2: 20 mmol/L — AB (ref 22–32)
CREATININE: 0.82 mg/dL (ref 0.44–1.00)
Chloride: 108 mmol/L (ref 101–111)
Glucose, Bld: 221 mg/dL — ABNORMAL HIGH (ref 65–99)
Potassium: 3.9 mmol/L (ref 3.5–5.1)
Sodium: 137 mmol/L (ref 135–145)
TOTAL PROTEIN: 6.5 g/dL (ref 6.5–8.1)

## 2015-08-30 LAB — URINE MICROSCOPIC-ADD ON

## 2015-08-30 LAB — LIPASE, BLOOD: Lipase: 26 U/L (ref 11–51)

## 2015-08-30 LAB — URINALYSIS, ROUTINE W REFLEX MICROSCOPIC
Bilirubin Urine: NEGATIVE
GLUCOSE, UA: 100 mg/dL — AB
Ketones, ur: NEGATIVE mg/dL
Nitrite: NEGATIVE
PH: 5 (ref 5.0–8.0)
PROTEIN: NEGATIVE mg/dL
SPECIFIC GRAVITY, URINE: 1.031 — AB (ref 1.005–1.030)

## 2015-08-30 LAB — POC URINE PREG, ED: Preg Test, Ur: NEGATIVE

## 2015-08-30 MED ORDER — OMEPRAZOLE 20 MG PO CPDR: 20.0000 mg | DELAYED_RELEASE_CAPSULE | Freq: Every day | ORAL | Status: DC

## 2015-08-30 MED ORDER — GI COCKTAIL ~~LOC~~
30.0000 mL | Freq: Once | ORAL | Status: AC
  Administered 2015-08-30: 30 mL via ORAL
  Filled 2015-08-30: qty 30

## 2015-08-30 MED ORDER — ACETAMINOPHEN 325 MG PO TABS
650.0000 mg | ORAL_TABLET | Freq: Once | ORAL | Status: AC
  Administered 2015-08-30: 650 mg via ORAL
  Filled 2015-08-30: qty 2

## 2015-08-30 MED ORDER — ONDANSETRON HCL 4 MG PO TABS: 4.0000 mg | ORAL_TABLET | Freq: Three times a day (TID) | ORAL | Status: DC | PRN

## 2015-08-30 NOTE — ED Provider Notes (Signed)
CSN: [id number]     Arrival date & time 08/29/15  2330 History   First MD Initiated Contact with Patient 08/30/15 [phone]     Chief Complaint  Patient presents with  . Emesis  . Diarrhea  . Abdominal Pain     (Consider location/radiation/quality/duration/timing/severity/associated sxs/prior Treatment) HPI    Patient is a 35 year old female recently started on Keflex for cellulitis. She reports she's been having diarrhea and acid reflux. She reports epigastric pain. Patient also occasionally has vomiting.  Patient's has no vomiting or stooling of blood. No fevers.    Past Medical History  Diagnosis Date  . Asthma   . GERD (gastroesophageal reflux disease)   . Diabetes mellitus without complication ([place])   . Hypertension    Past Surgical History  Procedure Laterality Date  . Cesarean section    . Tubal ligation    . Wisdom tooth extraction     Family History  Problem Relation Age of Onset  . Asthma Mother   . Asthma Father    Social History  Substance Use Topics  . Smoking status: Former Smoker -- 0.25 packs/day    Types: Cigarettes    Quit date: 11/02/2013  . Smokeless tobacco: Never Used  . Alcohol Use: No   OB History    Gravida Para Term Preterm AB TAB SAB Ectopic Multiple Living   _0 Review of Systems  Constitutional: Negative for fever, activity change and fatigue.  Respiratory: Negative for shortness of breath.   Cardiovascular: Negative for chest pain.  Gastrointestinal: Positive for vomiting, abdominal pain and diarrhea. Negative for constipation.  Genitourinary: Negative for dysuria.      Allergies  Review of patient's allergies indicates no known allergies.  Home Medications   Prior to Admission medications   Medication Sig Start Date End Date Taking? Authorizing Provider  albuterol (PROVENTIL HFA;VENTOLIN HFA) 108 (90 Base) MCG/ACT inhaler Inhale 2 puffs into the lungs every 6 (six) hours as needed for wheezing or shortness  of breath.    Historical Provider, MD  amLODipine (NORVASC) 2.5 MG tablet Take 2.5 mg by mouth daily.    Historical Provider, MD  Blood Glucose Monitoring Suppl (ACCU-CHEK AVIVA PLUS) w/Device KIT Use as directed 08/25/15   Tresa Garter, MD  cephALEXin (KEFLEX) 500 MG capsule Take 1 capsule (500 mg total) by mouth 4 (four) times daily. 08/23/15   Clayton Bibles, PA-C  gabapentin (NEURONTIN) 100 MG capsule Take 1 capsule (100 mg total) by mouth 3 (three) times daily. 08/25/15   Tresa Garter, MD  glucose blood (ACCU-CHEK AVIVA PLUS) test strip Use as instructed for once daily blood glucose testing 08/25/15   Tresa Garter, MD  glyBURIDE (DIABETA) 5 MG tablet Take 1 tablet (5 mg total) by mouth daily with breakfast. 08/25/15   Tresa Garter, MD  Lancets (ACCU-CHEK SOFT TOUCH) lancets Use as instructed for once daily blood glucose testing 08/25/15   Tresa Garter, MD  metFORMIN (GLUCOPHAGE) 1000 MG tablet Take 1 tablet (1,000 mg total) by mouth 2 (two) times daily with a meal. 08/25/15   Tresa Garter, MD  naproxen (NAPROSYN) 500 MG tablet Take 1 tablet (500 mg total) by mouth 2 (two) times daily as needed for mild pain, moderate pain or headache. 08/23/15   Clayton Bibles, PA-C  ondansetron (ZOFRAN ODT) 4 MG disintegrating tablet Take 1 tablet (4 mg total) by mouth every 8 (eight)  hours as needed for nausea or vomiting. 07/06/15   Kristen N Ward, DO  pantoprazole (PROTONIX) 40 MG tablet Take 1 tablet (40 mg total) by mouth daily. 07/06/15   Kristen N Ward, DO   BP 111/77 mmHg  Pulse 88  Temp(Src) 98.6 F (37 C) (Oral)  Resp 18  SpO2 96%  LMP 08/03/2015 (Exact Date) Physical Exam  Constitutional: She is oriented to person, place, and time. She appears well-developed and well-nourished.  Obese African American female. No acute distress.  HENT:  Head: Normocephalic and atraumatic.  Eyes: Conjunctivae are normal. Right eye exhibits no discharge.  Neck: Neck supple.   Cardiovascular: Normal rate, regular rhythm and normal heart sounds.   No murmur heard. Pulmonary/Chest: Effort normal and breath sounds normal. She has no wheezes. She has no rales.  Abdominal: Soft. She exhibits no distension. There is no tenderness.  Mild epigastric tenderness on palpation.  Musculoskeletal: Normal range of motion. She exhibits no edema.  Neurological: She is oriented to person, place, and time. No cranial nerve deficit.  Skin: Skin is warm and dry. No rash noted. She is not diaphoretic.  Mild erythema to the plantar surface of foot.  Psychiatric: She has a normal mood and affect. Her behavior is normal.  Nursing note and vitals reviewed.   ED Course  Procedures (including critical care time) Labs Review Labs Reviewed  COMPREHENSIVE METABOLIC PANEL - Abnormal; Notable for the following:    CO2 20 (*)    Glucose, Bld 221 (*)    All other components within normal limits  URINALYSIS, ROUTINE W REFLEX MICROSCOPIC (NOT AT [facility]) - Abnormal; Notable for the following:    APPearance CLOUDY (*)    Specific Gravity, Urine 1.031 (*)    Glucose, UA 100 (*)    Hgb urine dipstick TRACE (*)    Leukocytes, UA MODERATE (*)    All other components within normal limits  URINE MICROSCOPIC-ADD ON - Abnormal; Notable for the following:    Squamous Epithelial / LPF 0-5 (*)    Bacteria, UA RARE (*)    Crystals URIC ACID CRYSTALS (*)    All other components within normal limits  LIPASE, BLOOD  CBC  POC URINE PREG, ED    Imaging Review No results found. I have personally reviewed and evaluated these images and lab results as part of my medical decision-making.   EKG Interpretation None      MDM   Final diagnoses:  None    Patient is 35 year old female on metformin, glyburide and Keflex. Patient recently doubled her dose of Metformin. In addition she started Keflex. Since then she's had nausea and a little bit of diarrhea. Mild epigastric pain. We'll give her GI  cocktail. Here. I think it's likely med effect versus viral gastroenteritis.  Patient has normal physical exam and vital signs.  6:38 AM Taking PO, will have her follow up with PCP as needed.   Trenisha Lafavor Julio Alm, MD 08/30/15 [phone]

## 2015-08-30 NOTE — ED Notes (Signed)
No answer for VS 

## 2015-08-30 NOTE — Discharge Instructions (Signed)
You were seen today with epigastric pain. We think this is either gastritis or peptic ulcer disease. This and the diarrhea may actually be a result of the medications you are taking. However please continue to take them. (the diarrhea will resolve) Please take your Keflex for the infection and continue to take your metformin for diabetes. Please return to your primary care physician if the symptoms last longer than the next couple days. We're happy to report that your vital signs and lab work all are reassuring.

## 2015-08-30 NOTE — ED Notes (Signed)
Pt left with all her belongings and ambulated out of the treatment area.  

## 2015-09-05 ENCOUNTER — Telehealth: Payer: Self-pay | Admitting: *Deleted

## 2015-09-05 NOTE — Telephone Encounter (Signed)
Patient has appointment to see MD on 4/4.  Left message for patient to return call to [phone]

## 2015-09-05 NOTE — Telephone Encounter (Signed)
-----   Message from Pete Glatterawn T Langeland, MD sent at 08/30/2015  8:47 AM EDT ----- Regarding: call pt/ Please call patient and ask her to make an appt to see me soon.  I notice she was just at the ED, I am hoping she is feeling better.   Tell her to take some probiotics to help with the diarrhea.  Tell her to stop the keflex.  Probiotics such as yogurt (ex. Activia 2 x /day for few days) or probiotics OTC (Florastor 250mg  po 2x day for few days) will help get her back good bacterial flora and slow/stop diarrhea. Thank you.

## 2015-09-06 ENCOUNTER — Ambulatory Visit: Payer: Medicaid Other | Admitting: Internal Medicine

## 2015-09-07 ENCOUNTER — Encounter: Payer: Self-pay | Admitting: Internal Medicine

## 2015-09-07 ENCOUNTER — Ambulatory Visit: Payer: Medicaid Other | Attending: Internal Medicine | Admitting: Internal Medicine

## 2015-09-07 VITALS — BP 138/80 | HR 105 | Temp 98.0°F | Resp 16 | Ht 65.0 in | Wt 251.6 lb

## 2015-09-07 DIAGNOSIS — H538 Other visual disturbances: Secondary | ICD-10-CM

## 2015-09-07 DIAGNOSIS — E119 Type 2 diabetes mellitus without complications: Secondary | ICD-10-CM | POA: Diagnosis not present

## 2015-09-07 DIAGNOSIS — E0842 Diabetes mellitus due to underlying condition with diabetic polyneuropathy: Secondary | ICD-10-CM | POA: Diagnosis not present

## 2015-09-07 DIAGNOSIS — G44211 Episodic tension-type headache, intractable: Secondary | ICD-10-CM | POA: Diagnosis not present

## 2015-09-07 LAB — POCT URINALYSIS DIPSTICK
Bilirubin, UA: NEGATIVE
Blood, UA: NEGATIVE
GLUCOSE UA: 500
Ketones, UA: NEGATIVE
LEUKOCYTES UA: NEGATIVE
Nitrite, UA: NEGATIVE
PROTEIN UA: NEGATIVE
SPEC GRAV UA: 1.015
UROBILINOGEN UA: 0.2
pH, UA: 5

## 2015-09-07 LAB — GLUCOSE, POCT (MANUAL RESULT ENTRY): POC GLUCOSE: 326 mg/dL — AB (ref 70–99)

## 2015-09-07 MED ORDER — BUTALBITAL-APAP-CAFFEINE 50-325-40 MG PO TABS
1.0000 | ORAL_TABLET | Freq: Two times a day (BID) | ORAL | Status: DC | PRN
Start: 2015-09-07 — End: 2016-07-16

## 2015-09-07 MED ORDER — INSULIN ASPART 100 UNIT/ML ~~LOC~~ SOLN
20.0000 [IU] | Freq: Once | SUBCUTANEOUS | Status: AC
  Administered 2015-09-07: 20 [IU] via SUBCUTANEOUS

## 2015-09-07 MED ORDER — INSULIN GLARGINE 100 UNIT/ML SOLOSTAR PEN: 20.0000 [IU] | PEN_INJECTOR | Freq: Every day | SUBCUTANEOUS | Status: DC

## 2015-09-07 MED ORDER — INSULIN PEN NEEDLE 29G X 12.7MM MISC: 1.0000 | Freq: Every day | Status: DC

## 2015-09-07 MED ORDER — BUTALBITAL-APAP-CAFFEINE 50-325-40 MG PO TABS: 1.0000 | ORAL_TABLET | Freq: Two times a day (BID) | ORAL | Status: DC | PRN

## 2015-09-07 MED ORDER — GABAPENTIN 300 MG PO CAPS: 300.0000 mg | ORAL_CAPSULE | Freq: Three times a day (TID) | ORAL | Status: DC

## 2015-09-07 MED FILL — BUTALB-ACETAMIN-CAFF 50-325: 50-325-40 | 7 days supply | Qty: 14 | Fill #0

## 2015-09-07 MED FILL — LANTUS SOLOSTAR 100 UNITS/M: 100 | 30 days supply | Qty: 6 | Fill #0

## 2015-09-07 MED FILL — ULTICARE PEN NDL 4MM 32G: 32G X 4 MM | 30 days supply | Qty: 100 | Fill #0

## 2015-09-07 MED FILL — GABAPENTIN 300 MG CAPSULE: 300 | 30 days supply | Qty: 90 | Fill #0

## 2015-09-07 NOTE — Patient Instructions (Signed)
Sarah Garrison pharm 1 wk for dm chk  DASH Eating Plan DASH stands for "Dietary Approaches to Stop Hypertension." The DASH eating plan is a healthy eating plan that has been shown to reduce high blood pressure (hypertension). Additional health benefits may include reducing the risk of type 2 diabetes mellitus, heart disease, and stroke. The DASH eating plan may also help with weight loss. WHAT DO I NEED TO KNOW ABOUT THE DASH EATING PLAN? For the DASH eating plan, you will follow these general guidelines:  Choose foods with a percent daily value for sodium of less than 5% (as listed on the food label).  Use salt-free seasonings or herbs instead of table salt or sea salt.  Check with your health care provider or pharmacist before using salt substitutes.  Eat lower-sodium products, often labeled as "lower sodium" or "no salt added."  Eat fresh foods.  Eat more vegetables, fruits, and low-fat dairy products.  Choose whole grains. Look for the word "whole" as the first word in the ingredient list.  Choose fish and skinless chicken or [place] more often than red meat. Limit fish, poultry, and meat to 6 oz (170 g) each day.  Limit sweets, desserts, sugars, and sugary drinks.  Choose heart-healthy fats.  Limit cheese to 1 oz (28 g) per day.  Eat more home-cooked food and less restaurant, buffet, and fast food.  Limit fried foods.  Cook foods using methods other than frying.  Limit canned vegetables. If you do use them, rinse them well to decrease the sodium.  When eating at a restaurant, ask that your food be prepared with less salt, or no salt if possible. WHAT FOODS CAN I EAT? Seek help from a dietitian for individual calorie needs. Grains Whole grain or whole wheat bread. Brown rice. Whole grain or whole wheat pasta. Quinoa, bulgur, and whole grain cereals. Low-sodium cereals. Corn or whole wheat flour tortillas. Whole grain cornbread. Whole grain crackers. Low-sodium  crackers. Vegetables Fresh or frozen vegetables (raw, steamed, roasted, or grilled). Low-sodium or reduced-sodium tomato and vegetable juices. Low-sodium or reduced-sodium tomato sauce and paste. Low-sodium or reduced-sodium canned vegetables.  Fruits All fresh, canned (in natural juice), or frozen fruits. Meat and Other Protein Products Ground beef (85% or leaner), grass-fed beef, or beef trimmed of fat. Skinless chicken or [place]. Ground chicken or [place]key. Pork trimmed of fat. All fish and seafood. Eggs. Dried beans, peas, or lentils. Unsalted nuts and seeds. Unsalted canned beans. Dairy Low-fat dairy products, such as skim or 1% milk, 2% or reduced-fat cheeses, low-fat ricotta or cottage cheese, or plain low-fat yogurt. Low-sodium or reduced-sodium cheeses. Fats and Oils Tub margarines without trans fats. Light or reduced-fat mayonnaise and salad dressings (reduced sodium). Avocado. Safflower, olive, or canola oils. Natural peanut or almond butter. Other Unsalted popcorn and pretzels. The items listed above may not be a complete list of recommended foods or beverages. Contact your dietitian for more options. WHAT FOODS ARE NOT RECOMMENDED? Grains White bread. White pasta. White rice. Refined cornbread. Bagels and croissants. Crackers that contain trans fat. Vegetables Creamed or fried vegetables. Vegetables in a cheese sauce. Regular canned vegetables. Regular canned tomato sauce and paste. Regular tomato and vegetable juices. Fruits Dried fruits. Canned fruit in light or heavy syrup. Fruit juice. Meat and Other Protein Products Fatty cuts of meat. Ribs, chicken wings, bacon, sausage, bologna, salami, chitterlings, fatback, hot dogs, bratwurst, and packaged luncheon meats. Salted nuts and seeds. Canned beans with salt. Dairy Whole or 2% milk, cream,  half-and-half, and cream cheese. Whole-fat or sweetened yogurt. Full-fat cheeses or blue cheese. Nondairy creamers and whipped toppings.  Processed cheese, cheese spreads, or cheese curds. Condiments Onion and garlic salt, seasoned salt, table salt, and sea salt. Canned and packaged gravies. Worcestershire sauce. Tartar sauce. Barbecue sauce. Teriyaki sauce. Soy sauce, including reduced sodium. Steak sauce. Fish sauce. Oyster sauce. Cocktail sauce. Horseradish. Ketchup and mustard. Meat flavorings and tenderizers. Bouillon cubes. Hot sauce. Tabasco sauce. Marinades. Taco seasonings. Relishes. Fats and Oils Butter, stick margarine, lard, shortening, ghee, and bacon fat. Coconut, palm kernel, or palm oils. Regular salad dressings. Other Pickles and olives. Salted popcorn and pretzels. The items listed above may not be a complete list of foods and beverages to avoid. Contact your dietitian for more information. WHERE CAN I FIND MORE INFORMATION? National Heart, Lung, and Blood Institute: travelstabloid.com   This information is not intended to replace advice given to you by your health care provider. Make sure you discuss any questions you have with your health care provider.   Document Released: 05/10/2011 Document Revised: 06/11/2014 Document Reviewed: 03/25/2013 Elsevier Interactive Patient Education [employer].

## 2015-09-07 NOTE — Progress Notes (Signed)
Patient here for follow up on her diabetes Complains of bilateral foot pain Having some migraine headaches And blurred vision

## 2015-09-07 NOTE — Progress Notes (Signed)
Sarah Garrison, is a 35 y.o. female  JQB:[id number]  [id number]  DOB - [birth year]  Chief Complaint  Patient presents with  . Follow-up        Subjective:   Sarah Garrison is a 35 y.o. female here today for a follow up visit.  Patient complains of constant achy headaches for about a week but is improving the last few days.  Sinuses been complaining of blurry vision for the last few weeks.  Denies any current photophobia, nausea, vomiting, fevers or chills. She could not tolerate the glyburide, it gave her such about headaches that she stopped after a few days.  Still complaining of bilateral foot pain. Suspicious mole toes with a burning sensation and neurontin current dose is not helping  Her glucometer readings have been ranging  180 (only wants) to mid 300s for the most part.  Patient has No headache, No chest pain, No abdominal pain - No Nausea, No new weakness tingling or numbness, No Cough - SOB.  No problems updated.  ALLERGIES: No Known Allergies  PAST MEDICAL HISTORY: Past Medical History  Diagnosis Date  . Asthma   . GERD (gastroesophageal reflux disease)   . Diabetes mellitus without complication ([place])   . Hypertension     MEDICATIONS AT HOME: Prior to Admission medications   Medication Sig Start Date End Date Taking? Authorizing Provider  albuterol (PROVENTIL HFA;VENTOLIN HFA) 108 (90 Base) MCG/ACT inhaler Inhale 2 puffs into the lungs every 6 (six) hours as needed for wheezing or shortness of breath.    Historical Provider, MD  amLODipine (NORVASC) 2.5 MG tablet Take 2.5 mg by mouth daily.    Historical Provider, MD  Blood Glucose Monitoring Suppl (ACCU-CHEK AVIVA PLUS) w/Device KIT Use as directed 08/25/15   Tresa Garter, MD  butalbital-acetaminophen-caffeine (FIORICET, ESGIC) 50-325-40 MG tablet Take 1 tablet by mouth 2 (two) times daily as needed for headache. 09/07/15   Tresa Garter, MD  gabapentin (NEURONTIN) 300 MG capsule Take 1 capsule  (300 mg total) by mouth 3 (three) times daily. 09/07/15   Tresa Garter, MD  glucose blood (ACCU-CHEK AVIVA PLUS) test strip Use as instructed for once daily blood glucose testing 08/25/15   Tresa Garter, MD  Insulin Glargine (LANTUS SOLOSTAR) 100 UNIT/ML Solostar Pen Inject 20 Units into the skin daily at 10 pm. 09/07/15   Tresa Garter, MD  Insulin Pen Needle (ULTICARE PEN NEEDLES) 29G X 12.7MM MISC 1 each by Does not apply route at bedtime. 09/07/15   Tresa Garter, MD  Lancets (ACCU-CHEK SOFT TOUCH) lancets Use as instructed for once daily blood glucose testing 08/25/15   Tresa Garter, MD  metFORMIN (GLUCOPHAGE) 1000 MG tablet Take 1 tablet (1,000 mg total) by mouth 2 (two) times daily with a meal. 08/25/15   Tresa Garter, MD  naproxen (NAPROSYN) 500 MG tablet Take 1 tablet (500 mg total) by mouth 2 (two) times daily as needed for mild pain, moderate pain or headache. 08/23/15   Clayton Bibles, PA-C  omeprazole (PRILOSEC) 20 MG capsule Take 1 capsule (20 mg total) by mouth daily. 08/30/15   Courteney Lyn Mackuen, MD  ondansetron (ZOFRAN ODT) 4 MG disintegrating tablet Take 1 tablet (4 mg total) by mouth every 8 (eight) hours as needed for nausea or vomiting. 07/06/15   Kristen N Ward, DO  ondansetron (ZOFRAN) 4 MG tablet Take 1 tablet (4 mg total) by mouth every 8 (eight) hours as needed for nausea or vomiting. 08/30/15  Courteney Lyn Mackuen, MD  pantoprazole (PROTONIX) 40 MG tablet Take 1 tablet (40 mg total) by mouth daily. 07/06/15   Kristen N Ward, DO     Objective:   Filed Vitals:   09/07/15 0925  BP: 138/80  Pulse: 105  Temp: 98 F (36.7 C)  Resp: 16  Height: _0  (1.651 m)  Weight: 251 lb 9.6 oz (114.125 kg)  SpO2: 100%    Exam General appearance : Awake, alert, not in any distress. Speech Clear. Not toxic looking, morbid obese. HEENT: Atraumatic and Normocephalic, pupils equally reactive to light. Neck: supple, no JVD. No cervical lymphadenopathy.    Chest:Good air entry bilaterally, no added sounds. CVS: S1 S2 regular, no murmurs/gallups or rubs. Abdomen: Bowel sounds active, Non tender and not distended with no gaurding, rigidity or rebound. Extremities: B/L Lower Ext shows no edema, bilateral le very sensitive to touch/increase pain/burning. Neurology: Awake alert, and oriented X 3, CN II-XII grossly intact, Non focal Skin:No Rash  Data Review Lab Results  Component Value Date   HGBA1C 10.1 08/24/2015     Assessment & Plan   1. Type 2 diabetes mellitus without complication, without long-term current use of insulin (HCC) - added Lantus 20units, w/ metformin 1000bid - she gets significant stomach indigestion w/ metformin, encouraged to take it w/ yogurt. - pt saw Pharmacy student today, and he instructed her on use of Lantus solarstar pen and how to use her glucometer - Urinalysis Dipstick -neg ketones - insulin aspart (novoLOG) injection 20 Units; Inject 0.2 mLs (20 Units total) into the skin once. today - Ambulatory referral to Ophthalmology - Glucose (CBG)  326 today - [facility] 1 wk for DM chk  2. Diabetic polyneuropathy associated with diabetes mellitus due to underlying condition ([place]) -increase neurontin to 300tid for now, will follow.  3. Morbid obesity due to excess calories ([place]) , bmi 42 Patient have been counseled extensively about nutrition and exercise DASH/ada diet encoruaged again.  4. Intractable, episodic tension-type headache - appears slowly improving w/ advil, encouraged to take advil w/ food if she is taking it. - prn fiorocet given as well., use sparingly.  5. Blurry vision, bilateral - Ambulatory referral to Ophthalmology /dm annual eye exam  6. Jerrye Bushy? Trial ppi   Patient have been counseled extensively about nutrition and exercise  Fu w/ me 1 month  The patient was given clear instructions to go to ER or return to medical center if symptoms don't improve, worsen or new  problems develop. The patient verbalized understanding. The patient was told to call to get lab results if they haven't heard anything in the next week.    Maren Reamer, MD, [facility] [place], [place]   09/07/2015, 2:33 PM

## 2015-09-08 ENCOUNTER — Telehealth: Payer: Self-pay | Admitting: Internal Medicine

## 2015-09-08 NOTE — Telephone Encounter (Signed)
Pt. Called stating she had an appointment yesterday and she was prescribed Lantus. Pt. Stated she was told that Lantus was going to bring her blood sugar down and she has been checking it and it has been 366. Before she stated taking Lantus it was 317. Please f/u

## 2015-09-13 ENCOUNTER — Telehealth: Payer: Self-pay | Admitting: *Deleted

## 2015-09-13 ENCOUNTER — Other Ambulatory Visit: Payer: Self-pay | Admitting: *Deleted

## 2015-09-13 DIAGNOSIS — E1140 Type 2 diabetes mellitus with diabetic neuropathy, unspecified: Secondary | ICD-10-CM

## 2015-09-13 MED ORDER — ACCU-CHEK AVIVA PLUS W/DEVICE KIT
1.0000 | PACK | Freq: Three times a day (TID) | Status: DC
Start: 2015-09-13 — End: 2016-07-16

## 2015-09-13 MED ORDER — GLUCOSE BLOOD VI STRP: ORAL_STRIP | Status: DC

## 2015-09-13 MED ORDER — ACCU-CHEK SOFTCLIX LANCET DEV MISC: Status: DC

## 2015-09-13 MED ORDER — TRUE METRIX METER W/DEVICE KIT: 1.0000 | PACK | Freq: Three times a day (TID) | Status: DC

## 2015-09-13 MED FILL — ACCU-CHEK SOFTCLIX LANCETS: 30 days supply | Qty: 100 | Fill #0

## 2015-09-13 MED FILL — ACCU-CHEK AVIVA PLUS METER: W/DEVICE | 30 days supply | Qty: 1 | Fill #0

## 2015-09-13 MED FILL — ACCU-CHEK AVIVA PLUS TEST S: 30 days supply | Qty: 100 | Fill #0

## 2015-09-13 NOTE — Telephone Encounter (Signed)
MA spoke with patient. Patient is aware of increase need. Patient has appointment with Misty StanleyStacey on 09/14/15
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
## 2015-09-13 NOTE — Telephone Encounter (Signed)
Patients supplies were reordered with [employer]

## 2015-09-14 ENCOUNTER — Ambulatory Visit: Payer: Medicaid Other | Attending: Internal Medicine | Admitting: Pharmacist

## 2015-09-14 ENCOUNTER — Encounter: Payer: Self-pay | Admitting: Pharmacist

## 2015-09-14 DIAGNOSIS — E1140 Type 2 diabetes mellitus with diabetic neuropathy, unspecified: Secondary | ICD-10-CM | POA: Diagnosis not present

## 2015-09-14 LAB — GLUCOSE, POCT (MANUAL RESULT ENTRY): POC Glucose: 362 mg/dl — AB (ref 70–99)

## 2015-09-14 MED ORDER — INSULIN GLARGINE 100 UNIT/ML SOLOSTAR PEN: 20.0000 [IU] | PEN_INJECTOR | Freq: Every day | SUBCUTANEOUS | Status: DC

## 2015-09-14 MED FILL — LANTUS SOLOSTAR 100 UNITS/M: 100 | 30 days supply | Qty: 6 | Fill #1
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
## 2015-09-14 NOTE — Patient Instructions (Signed)
Thanks for coming to see us!  Pick up your meter and insulin today and restart it  Come back in a week with your meter   Basic Carbohydrate Counting for Diabetes Mellitus Carbohydrate counting is a method for keeping track of the amount of carbohydrates you eat. Eating carbohydrates naturally increases the level of sugar (glucose) in your blood, so it is important for you to know the amount that is okay for you to have in every meal. Carbohydrate counting helps keep the level of glucose in your blood within normal limits. The amount of carbohydrates allowed is different for every person. A dietitian can help you calculate the amount that is right for you. Once you know the amount of carbohydrates you can have, you can count the carbohydrates in the foods you want to eat. Carbohydrates are found in the following foods:  Grains, such as breads and cereals.  Dried beans and soy products.  Starchy vegetables, such as potatoes, peas, and corn.  Fruit and fruit juices.  Milk and yogurt.  Sweets and snack foods, such as cake, cookies, candy, chips, soft drinks, and fruit drinks. CARBOHYDRATE COUNTING There are two ways to count the carbohydrates in your food. You can use either of the methods or a combination of both. Reading the "Nutrition Facts" on Packaged Food The "Nutrition Facts" is an area that is included on the labels of almost all packaged food and beverages in the [place]. It includes the serving size of that food or beverage and information about the nutrients in each serving of the food, including the grams (g) of carbohydrate per serving.  Decide the number of servings of this food or beverage that you will be able to eat or drink. Multiply that number of servings by the number of grams of carbohydrate that is listed on the label for that serving. The total will be the amount of carbohydrates you will be having when you eat or drink this food or beverage. Learning Standard  Serving Sizes of Food When you eat food that is not packaged or does not include "Nutrition Facts" on the label, you need to measure the servings in order to count the amount of carbohydrates.A serving of most carbohydrate-rich foods contains about 15 g of carbohydrates. The following list includes serving sizes of carbohydrate-rich foods that provide 15 g ofcarbohydrate per serving:   1 slice of bread (1 oz) or 1 six-inch tortilla.    of a hamburger bun or English muffin.  4-6 crackers.   cup unsweetened dry cereal.    cup hot cereal.   cup rice or pasta.    cup mashed potatoes or  of a large baked potato.  1 cup fresh fruit or one small piece of fruit.    cup canned or frozen fruit or fruit juice.  1 cup milk.   cup plain fat-free yogurt or yogurt sweetened with artificial sweeteners.   cup cooked dried beans or starchy vegetable, such as peas, corn, or potatoes.  Decide the number of standard-size servings that you will eat. Multiply that number of servings by 15 (the grams of carbohydrates in that serving). For example, if you eat 2 cups of strawberries, you will have eaten 2 servings and 30 g of carbohydrates (2 servings x 15 g = 30 g). For foods such as soups and casseroles, in which more than one food is mixed in, you will need to count the carbohydrates in each food that is included. EXAMPLE OF CARBOHYDRATE COUNTING  Sample Dinner  3 oz chicken breast.   cup of brown rice.   cup of corn.  1 cup milk.   1 cup strawberries with sugar-free whipped topping.  Carbohydrate Calculation Step 1: Identify the foods that contain carbohydrates:   Rice.   Corn.   Milk.   Strawberries. Step 2:Calculate the number of servings eaten of each:   2 servings of rice.   1 serving of corn.   1 serving of milk.   1 serving of strawberries. Step 3: Multiply each of those number of servings by 15 g:   2 servings of rice x 15 g = 30 g.   1  serving of corn x 15 g = 15 g.   1 serving of milk x 15 g = 15 g.   1 serving of strawberries x 15 g = 15 g. Step 4: Add together all of the amounts to find the total grams of carbohydrates eaten: 30 g + 15 g + 15 g + 15 g = 75 g.   This information is not intended to replace advice given to you by your health care provider. Make sure you discuss any questions you have with your health care provider.   Document Released: 05/21/2005 Document Revised: 06/11/2014 Document Reviewed: 04/17/2013 Elsevier Interactive Patient Education [employer]

## 2015-09-20 LAB — HM DIABETES EYE EXAM

## 2015-09-23 ENCOUNTER — Emergency Department (HOSPITAL_COMMUNITY)
Admission: EM | Admit: 2015-09-23 | Discharge: 2015-09-24 | Disposition: A | Discharge status: Home / Self Care | Payer: Medicaid Other | Attending: Emergency Medicine | Admitting: Emergency Medicine

## 2015-09-23 ENCOUNTER — Encounter (HOSPITAL_COMMUNITY): Payer: Self-pay | Admitting: Oncology

## 2015-09-23 DIAGNOSIS — Z794 Long term (current) use of insulin: Secondary | ICD-10-CM | POA: Insufficient documentation

## 2015-09-23 DIAGNOSIS — I10 Essential (primary) hypertension: Secondary | ICD-10-CM | POA: Diagnosis not present

## 2015-09-23 DIAGNOSIS — Z87891 Personal history of nicotine dependence: Secondary | ICD-10-CM | POA: Diagnosis not present

## 2015-09-23 DIAGNOSIS — R0981 Nasal congestion: Secondary | ICD-10-CM | POA: Insufficient documentation

## 2015-09-23 DIAGNOSIS — G43809 Other migraine, not intractable, without status migrainosus: Secondary | ICD-10-CM | POA: Diagnosis not present

## 2015-09-23 DIAGNOSIS — J45909 Unspecified asthma, uncomplicated: Secondary | ICD-10-CM | POA: Diagnosis not present

## 2015-09-23 DIAGNOSIS — E119 Type 2 diabetes mellitus without complications: Secondary | ICD-10-CM | POA: Insufficient documentation

## 2015-09-23 DIAGNOSIS — Z79899 Other long term (current) drug therapy: Secondary | ICD-10-CM | POA: Insufficient documentation

## 2015-09-23 DIAGNOSIS — Z3202 Encounter for pregnancy test, result negative: Secondary | ICD-10-CM | POA: Diagnosis not present

## 2015-09-23 DIAGNOSIS — K219 Gastro-esophageal reflux disease without esophagitis: Secondary | ICD-10-CM | POA: Diagnosis not present

## 2015-09-23 DIAGNOSIS — Z7984 Long term (current) use of oral hypoglycemic drugs: Secondary | ICD-10-CM | POA: Insufficient documentation

## 2015-09-23 DIAGNOSIS — R51 Headache: Secondary | ICD-10-CM | POA: Diagnosis present

## 2015-09-23 LAB — CBG MONITORING, ED: Glucose-Capillary: 213 mg/dL — ABNORMAL HIGH (ref 65–99)

## 2015-09-23 LAB — POC URINE PREG, ED: PREG TEST UR: NEGATIVE

## 2015-09-23 MED ORDER — KETOROLAC TROMETHAMINE 30 MG/ML IJ SOLN
30.0000 mg | Freq: Once | INTRAMUSCULAR | Status: AC
  Administered 2015-09-23: 30 mg via INTRAVENOUS
  Filled 2015-09-23: qty 1

## 2015-09-23 MED ORDER — SODIUM CHLORIDE 0.9 % IV BOLUS (SEPSIS)
1000.0000 mL | Freq: Once | INTRAVENOUS | Status: AC
  Administered 2015-09-23: 1000 mL via INTRAVENOUS

## 2015-09-23 MED ORDER — INSULIN GLARGINE 100 UNIT/ML ~~LOC~~ SOLN
15.0000 [IU] | Freq: Once | SUBCUTANEOUS | Status: AC
  Administered 2015-09-23: 15 [IU] via SUBCUTANEOUS
  Filled 2015-09-23: qty 0.15

## 2015-09-23 MED ORDER — METOCLOPRAMIDE HCL 5 MG/ML IJ SOLN
10.0000 mg | Freq: Once | INTRAMUSCULAR | Status: AC
  Administered 2015-09-23: 10 mg via INTRAVENOUS
  Filled 2015-09-23: qty 2

## 2015-09-23 MED ORDER — DIPHENHYDRAMINE HCL 50 MG/ML IJ SOLN
25.0000 mg | Freq: Once | INTRAMUSCULAR | Status: AC
  Administered 2015-09-23: 25 mg via INTRAVENOUS
  Filled 2015-09-23: qty 1

## 2015-09-23 NOTE — Discharge Instructions (Signed)

## 2015-09-23 NOTE — ED Notes (Signed)
Pt reports that she has had a generalized HA since Monday.  PTA pt took butalb-acetamin-caf 50-235 at 1900 w/o relief.  Rates pain 10/10, aching in nature.

## 2015-09-23 NOTE — ED Provider Notes (Signed)
CSN: [id number]     Arrival date & time 09/23/15  2127 History   First MD Initiated Contact with Patient 09/23/15 2203     Chief Complaint  Patient presents with  . Headache     (Consider location/radiation/quality/duration/timing/severity/associated sxs/prior Treatment) Patient is a 35 y.o. female presenting with headaches. The history is provided by the patient.  Headache Pain location:  Generalized Quality:  Dull Radiates to:  Does not radiate Onset quality:  Gradual Timing:  Constant Progression:  Unchanged Chronicity:  New Context: bright light   Worsened by:  Nothing Ineffective treatments:  None tried Associated symptoms: congestion (nasal)   Associated symptoms: no cough, no fever and no neck stiffness     Past Medical History  Diagnosis Date  . Asthma   . GERD (gastroesophageal reflux disease)   . Diabetes mellitus without complication ([place])   . Hypertension    Past Surgical History  Procedure Laterality Date  . Cesarean section    . Tubal ligation    . Wisdom tooth extraction     Family History  Problem Relation Age of Onset  . Asthma Mother   . Asthma Father    Social History  Substance Use Topics  . Smoking status: Former Smoker -- 0.25 packs/day    Types: Cigarettes    Quit date: 11/02/2013  . Smokeless tobacco: Never Used  . Alcohol Use: No   OB History    Gravida Para Term Preterm AB TAB SAB Ectopic Multiple Living   5 4   1  1   4      Review of Systems  Constitutional: Negative for fever.  HENT: Positive for congestion (nasal).   Respiratory: Negative for cough.   Musculoskeletal: Negative for neck stiffness.  Neurological: Positive for headaches.  All other systems reviewed and are negative.     Allergies  Review of patient's allergies indicates no known allergies.  Home Medications   Prior to Admission medications   Medication Sig Start Date End Date Taking? Authorizing Provider  albuterol (PROVENTIL HFA;VENTOLIN HFA) 108  (90 Base) MCG/ACT inhaler Inhale 2 puffs into the lungs every 6 (six) hours as needed for wheezing or shortness of breath.   Yes Historical Provider, MD  amLODipine (NORVASC) 2.5 MG tablet Take 2.5 mg by mouth daily. Reported on 09/23/2015   Yes Historical Provider, MD  butalbital-acetaminophen-caffeine (FIORICET, ESGIC) 50-325-40 MG tablet Take 1 tablet by mouth 2 (two) times daily as needed for headache. 09/07/15  Yes Tresa Garter, MD  gabapentin (NEURONTIN) 300 MG capsule Take 1 capsule (300 mg total) by mouth 3 (three) times daily. 09/07/15  Yes Tresa Garter, MD  Insulin Glargine (LANTUS SOLOSTAR) 100 UNIT/ML Solostar Pen Inject 20 Units into the skin daily at 10 pm. Patient taking differently: Inject 15 Units into the skin 2 (two) times daily.  09/14/15  Yes Tresa Garter, MD  metFORMIN (GLUCOPHAGE) 1000 MG tablet Take 1 tablet (1,000 mg total) by mouth 2 (two) times daily with a meal. 08/25/15  Yes Olugbemiga E Doreene Burke, MD  omeprazole (PRILOSEC) 20 MG capsule Take 1 capsule (20 mg total) by mouth daily. 08/30/15  Yes Courteney Lyn Mackuen, MD  ondansetron (ZOFRAN ODT) 4 MG disintegrating tablet Take 1 tablet (4 mg total) by mouth every 8 (eight) hours as needed for nausea or vomiting. 07/06/15  Yes Kristen N Ward, DO  pantoprazole (PROTONIX) 40 MG tablet Take 1 tablet (40 mg total) by mouth daily. 07/06/15  Yes Addy, DO  Blood Glucose Monitoring Suppl (ACCU-CHEK AVIVA PLUS) w/Device KIT 1 each by Does not apply route 3 (three) times daily. 09/13/15   Tresa Garter, MD  glucose blood test strip Use as instructed 09/13/15   Tresa Garter, MD  Insulin Pen Needle (ULTICARE PEN NEEDLES) 29G X 12.7MM MISC 1 each by Does not apply route at bedtime. 09/07/15   Tresa Garter, MD  Lancet Devices (ACCU-CHEK [facility]) lancets Use as instructed 09/13/15   Tresa Garter, MD  Lancets (ACCU-CHEK SOFT TOUCH) lancets Use as instructed for once daily blood glucose testing 08/25/15    Tresa Garter, MD  naproxen (NAPROSYN) 500 MG tablet Take 1 tablet (500 mg total) by mouth 2 (two) times daily as needed for mild pain, moderate pain or headache. Patient not taking: Reported on 09/23/2015 08/23/15   Clayton Bibles, PA-C  ondansetron (ZOFRAN) 4 MG tablet Take 1 tablet (4 mg total) by mouth every 8 (eight) hours as needed for nausea or vomiting. Patient not taking: Reported on 09/23/2015 08/30/15   Courteney Lyn Mackuen, MD   BP 123/105 mmHg  Pulse 96  Temp(Src) 98.2 F (36.8 C) (Oral)  Resp 16  Ht 5' 4"  (1.626 m)  Wt 252 lb (114.306 kg)  BMI 43.23 kg/m2  SpO2 99%  LMP 09/08/2015 (Exact Date) Physical Exam  Constitutional: She is oriented to person, place, and time. She appears well-developed and well-nourished. No distress.  HENT:  Head: Normocephalic.  Eyes: Conjunctivae are normal.  Neck: Neck supple. No tracheal deviation present.  Cardiovascular: Normal rate, regular rhythm and normal heart sounds.   Pulmonary/Chest: Effort normal and breath sounds normal. No respiratory distress.  Abdominal: Soft. She exhibits no distension. There is no tenderness.  Neurological: She is alert and oriented to person, place, and time. She has normal strength. No cranial nerve deficit. Coordination and gait normal. GCS eye subscore is 4. GCS verbal subscore is 5. GCS motor subscore is 6.  Normal finger to nose testing and rapid alternating movement   Skin: Skin is warm and dry.  Psychiatric: She has a normal mood and affect.    ED Course  Procedures (including critical care time) Labs Review Labs Reviewed - No data to display  Imaging Review No results found. I have personally reviewed and evaluated these images and lab results as part of my medical decision-making.   EKG Interpretation None      MDM   Final diagnoses:  Other migraine without status migrainosus, not intractable    35 y.o. female presents with migraine headache that is similar to prior starting 4  days ago after bright lights following eye dilation for retinal images. Not responding to supportive care measures at home. No neurologic deficits here and otherwise well appearing despite discomfort. No red flag symptoms for headache, no signs or symptoms of spontaneous ICH. Provided migraine cocktail with good relief of symptoms. Plan to follow up with PCP as needed and return precautions discussed for worsening or new concerning symptoms.     Leo Grosser, MD 09/23/15 2329

## 2015-09-25 ENCOUNTER — Encounter (HOSPITAL_COMMUNITY): Payer: Self-pay | Admitting: Emergency Medicine

## 2015-09-25 ENCOUNTER — Emergency Department (HOSPITAL_COMMUNITY)
Admission: EM | Admit: 2015-09-25 | Discharge: 2015-09-25 | Disposition: A | Discharge status: Home / Self Care | Payer: Medicaid Other | Attending: Emergency Medicine | Admitting: Emergency Medicine

## 2015-09-25 DIAGNOSIS — K219 Gastro-esophageal reflux disease without esophagitis: Secondary | ICD-10-CM | POA: Diagnosis not present

## 2015-09-25 DIAGNOSIS — J029 Acute pharyngitis, unspecified: Secondary | ICD-10-CM

## 2015-09-25 DIAGNOSIS — E119 Type 2 diabetes mellitus without complications: Secondary | ICD-10-CM | POA: Insufficient documentation

## 2015-09-25 DIAGNOSIS — Z7951 Long term (current) use of inhaled steroids: Secondary | ICD-10-CM | POA: Diagnosis not present

## 2015-09-25 DIAGNOSIS — B9789 Other viral agents as the cause of diseases classified elsewhere: Secondary | ICD-10-CM | POA: Diagnosis not present

## 2015-09-25 DIAGNOSIS — J45909 Unspecified asthma, uncomplicated: Secondary | ICD-10-CM | POA: Insufficient documentation

## 2015-09-25 DIAGNOSIS — Z791 Long term (current) use of non-steroidal anti-inflammatories (NSAID): Secondary | ICD-10-CM | POA: Insufficient documentation

## 2015-09-25 DIAGNOSIS — J028 Acute pharyngitis due to other specified organisms: Secondary | ICD-10-CM | POA: Insufficient documentation

## 2015-09-25 DIAGNOSIS — Z79899 Other long term (current) drug therapy: Secondary | ICD-10-CM | POA: Insufficient documentation

## 2015-09-25 DIAGNOSIS — Z87891 Personal history of nicotine dependence: Secondary | ICD-10-CM | POA: Insufficient documentation

## 2015-09-25 DIAGNOSIS — H9203 Otalgia, bilateral: Secondary | ICD-10-CM | POA: Diagnosis not present

## 2015-09-25 DIAGNOSIS — Z7984 Long term (current) use of oral hypoglycemic drugs: Secondary | ICD-10-CM | POA: Diagnosis not present

## 2015-09-25 DIAGNOSIS — I10 Essential (primary) hypertension: Secondary | ICD-10-CM | POA: Diagnosis not present

## 2015-09-25 DIAGNOSIS — Z794 Long term (current) use of insulin: Secondary | ICD-10-CM | POA: Insufficient documentation

## 2015-09-25 LAB — RAPID STREP SCREEN (MED CTR MEBANE ONLY): STREPTOCOCCUS, GROUP A SCREEN (DIRECT): NEGATIVE

## 2015-09-25 LAB — CBG MONITORING, ED: Glucose-Capillary: 189 mg/dL — ABNORMAL HIGH (ref 65–99)

## 2015-09-25 MED ORDER — CHLORHEXIDINE GLUCONATE 0.12 % MT SOLN: 15.0000 mL | Freq: Two times a day (BID) | OROMUCOSAL | Status: DC

## 2015-09-25 MED ORDER — HYDROCODONE-ACETAMINOPHEN 7.5-325 MG/15ML PO SOLN: 10.0000 mL | Freq: Four times a day (QID) | ORAL | Status: DC | PRN

## 2015-09-25 NOTE — ED Provider Notes (Signed)
CSN: [id number]     Arrival date & time 09/25/15  1809 History  By signing my name below, I, Sarah Garrison, attest that this documentation has been prepared under the direction and in the presence of Sarah Moras PA-C. Electronically Signed: Altamease Garrison, ED Scribe. 09/25/2015 7:39 PM  Chief Complaint  Patient presents with  . Sore Throat  . Otalgia   The history is provided by the patient. No language interpreter was used.   Sarah Garrison is a 35 y.o. female who presents to the Emergency Department complaining of constant sore throat with onset yesterday. Cough drops and chloraseptic spray have provided insufficient rellief at home. She has had strep throat twice in the past and this feels similar. Associated symptoms include bilateral ear pain with swallowing and headache. Pt denies fever, nasal congestion, sneezing, CP, SOB, neck pain, difficulty breathing, and N/V/D.  Past Medical History  Diagnosis Date  . Asthma   . GERD (gastroesophageal reflux disease)   . Diabetes mellitus without complication ([place])   . Hypertension    Past Surgical History  Procedure Laterality Date  . Cesarean section    . Tubal ligation    . Wisdom tooth extraction     Family History  Problem Relation Age of Onset  . Asthma Mother   . Asthma Father    Social History  Substance Use Topics  . Smoking status: Former Smoker -- 0.25 packs/day    Types: Cigarettes    Quit date: 11/02/2013  . Smokeless tobacco: Never Used  . Alcohol Use: No   OB History    Gravida Para Term Preterm AB TAB SAB Ectopic Multiple Living   _0 Review of Systems  Constitutional: Negative for fever.  HENT: Positive for ear pain. Negative for congestion and sneezing.   Respiratory: Negative for apnea and shortness of breath.   Cardiovascular: Negative for chest pain.  Gastrointestinal: Negative for nausea, vomiting and diarrhea.  Musculoskeletal: Negative for neck pain.  Neurological: Positive  for headaches.   Allergies  Review of patient's allergies indicates no known allergies.  Home Medications   Prior to Admission medications   Medication Sig Start Date End Date Taking? Authorizing Provider  albuterol (PROVENTIL HFA;VENTOLIN HFA) 108 (90 Base) MCG/ACT inhaler Inhale 2 puffs into the lungs every 6 (six) hours as needed for wheezing or shortness of breath.    Historical Provider, MD  amLODipine (NORVASC) 2.5 MG tablet Take 2.5 mg by mouth daily. Reported on 09/23/2015    Historical Provider, MD  Blood Glucose Monitoring Suppl (ACCU-CHEK AVIVA PLUS) w/Device KIT 1 each by Does not apply route 3 (three) times daily. 09/13/15   Tresa Garter, MD  butalbital-acetaminophen-caffeine (FIORICET, ESGIC) [id number] MG tablet Take 1 tablet by mouth 2 (two) times daily as needed for headache. 09/07/15   Tresa Garter, MD  gabapentin (NEURONTIN) 300 MG capsule Take 1 capsule (300 mg total) by mouth 3 (three) times daily. 09/07/15   Tresa Garter, MD  glucose blood test strip Use as instructed 09/13/15   Tresa Garter, MD  Insulin Glargine (LANTUS SOLOSTAR) 100 UNIT/ML Solostar Pen Inject 20 Units into the skin daily at 10 pm. Patient taking differently: Inject 15 Units into the skin 2 (two) times daily.  09/14/15   Tresa Garter, MD  Insulin Pen Needle (ULTICARE PEN NEEDLES) 29G X 12.7MM MISC 1 each by Does not apply route at bedtime. 09/07/15  Tresa Garter, MD  Lancet Devices [facility]) lancets Use as instructed 09/13/15   Tresa Garter, MD  Lancets (ACCU-CHEK SOFT TOUCH) lancets Use as instructed for once daily blood glucose testing 08/25/15   Tresa Garter, MD  metFORMIN (GLUCOPHAGE) 1000 MG tablet Take 1 tablet (1,000 mg total) by mouth 2 (two) times daily with a meal. 08/25/15   Tresa Garter, MD  naproxen (NAPROSYN) 500 MG tablet Take 1 tablet (500 mg total) by mouth 2 (two) times daily as needed for mild pain, moderate pain or  headache. Patient not taking: Reported on 09/23/2015 08/23/15   Clayton Bibles, PA-C  omeprazole (PRILOSEC) 20 MG capsule Take 1 capsule (20 mg total) by mouth daily. 08/30/15   Courteney Lyn Mackuen, MD  ondansetron (ZOFRAN ODT) 4 MG disintegrating tablet Take 1 tablet (4 mg total) by mouth every 8 (eight) hours as needed for nausea or vomiting. 07/06/15   Kristen N Ward, DO  ondansetron (ZOFRAN) 4 MG tablet Take 1 tablet (4 mg total) by mouth every 8 (eight) hours as needed for nausea or vomiting. Patient not taking: Reported on 09/23/2015 08/30/15   Courteney Lyn Mackuen, MD  pantoprazole (PROTONIX) 40 MG tablet Take 1 tablet (40 mg total) by mouth daily. 07/06/15   Kristen N Ward, DO   BP 111/74 mmHg  Pulse 98  Temp(Src) 98.1 F (36.7 C) (Oral)  Resp 20  SpO2 100%  LMP 09/08/2015 (Exact Date) Physical Exam  Constitutional: She is oriented to person, place, and time. She appears well-developed and well-nourished. No distress.  HENT:  Head: Normocephalic and atraumatic.  Uvula midline No tonsillar enlargement or exudate No trismus   Eyes: Conjunctivae and EOM are normal.  Neck: Neck supple. No tracheal deviation present.  Cardiovascular: Normal rate and regular rhythm.   Pulmonary/Chest: Effort normal and breath sounds normal. No respiratory distress.  CTAB   Musculoskeletal: Normal range of motion.  Lymphadenopathy:    She has cervical adenopathy (anterior ).  Neurological: She is alert and oriented to person, place, and time.  Skin: Skin is warm and dry.  Psychiatric: She has a normal mood and affect. Her behavior is normal.  Nursing note and vitals reviewed.   ED Course  Procedures (including critical care time) DIAGNOSTIC STUDIES: Oxygen Saturation is 100% on RA,  normal by my interpretation.    COORDINATION OF CARE: 7:23 PM Discussed treatment plan which includes lab work with pt at bedside and pt agreed to plan.  Labs Review Labs Reviewed  CBG MONITORING, ED - Abnormal;  Notable for the following:    Glucose-Capillary 189 (*)    All other components within normal limits  RAPID STREP SCREEN (NOT AT [facility])  CULTURE, GROUP A STREP [facility])     MDM   Final diagnoses:  Viral pharyngitis    BP 111/74 mmHg  Pulse 98  Temp(Src) 98.1 F (36.7 C) (Oral)  Resp 20  SpO2 100%  LMP 09/08/2015 (Exact Date)  I personally performed the services described in this documentation, which was scribed in my presence. The recorded information has been reviewed and is accurate.   7:51 PM Pt here with sore throat, son who is in the room is with similar sxs.  Both pt's strep test were negative.  No evidence of deep tissue infection.  She is afebrile, vss, tolerates her own secretion.  Will provide sxs treatment.  Return precaution discussed.  Pt stable for discharge.     Sarah Moras, PA-C 09/25/15 1952  Julianne Rice, MD 10/03/15 2220

## 2015-09-25 NOTE — Discharge Instructions (Signed)

## 2015-09-25 NOTE — ED Notes (Signed)
Patient states she woke up with a sore throat around 5am. Patient states her ear ache started this morning. Patient has taken chloraseptic spray, otc cough drops and oral tylenol.

## 2015-09-29 ENCOUNTER — Encounter: Payer: Self-pay | Admitting: Internal Medicine

## 2015-09-29 ENCOUNTER — Ambulatory Visit (HOSPITAL_BASED_OUTPATIENT_CLINIC_OR_DEPARTMENT_OTHER): Payer: Medicaid Other | Admitting: Internal Medicine

## 2015-09-29 ENCOUNTER — Encounter: Payer: Self-pay | Admitting: Pharmacist

## 2015-09-29 ENCOUNTER — Other Ambulatory Visit: Payer: Self-pay | Admitting: Internal Medicine

## 2015-09-29 ENCOUNTER — Ambulatory Visit: Payer: Medicaid Other | Attending: Internal Medicine | Admitting: Pharmacist

## 2015-09-29 VITALS — BP 133/82 | HR 94 | Temp 98.0°F | Resp 18 | Ht 65.0 in | Wt 253.0 lb

## 2015-09-29 VITALS — BP 126/85 | HR 89

## 2015-09-29 DIAGNOSIS — M7989 Other specified soft tissue disorders: Secondary | ICD-10-CM

## 2015-09-29 DIAGNOSIS — Z794 Long term (current) use of insulin: Secondary | ICD-10-CM | POA: Insufficient documentation

## 2015-09-29 DIAGNOSIS — R351 Nocturia: Secondary | ICD-10-CM | POA: Diagnosis not present

## 2015-09-29 DIAGNOSIS — E119 Type 2 diabetes mellitus without complications: Secondary | ICD-10-CM | POA: Diagnosis not present

## 2015-09-29 DIAGNOSIS — E1140 Type 2 diabetes mellitus with diabetic neuropathy, unspecified: Secondary | ICD-10-CM | POA: Insufficient documentation

## 2015-09-29 LAB — GLUCOSE, POCT (MANUAL RESULT ENTRY): POC Glucose: 272 mg/dl — AB (ref 70–99)

## 2015-09-29 NOTE — Progress Notes (Signed)
Patient is here for L leg Pain  Patient complains of leg pain being present for two weeks. Patient has not eaten.

## 2015-09-29 NOTE — Patient Instructions (Signed)
Thanks for coming to see us!  Increase Lantus to 35 units daily  Keep monitoring your blood glucose  Follow up with Dr. Julien NordmannLangeland

## 2015-09-29 NOTE — Patient Instructions (Signed)
Diabetes and Exercise Exercising regularly is important. It is not just about losing weight. It has many health benefits, such as:  Improving your overall fitness, flexibility, and endurance.  Increasing your bone density.  Helping with weight control.  Decreasing your body fat.  Increasing your muscle strength.  Reducing stress and tension.  Improving your overall health. People with diabetes who exercise gain additional benefits because exercise:  Reduces appetite.  Improves the body's use of blood sugar (glucose).  Helps lower or control blood glucose.  Decreases blood pressure.  Helps control blood lipids (such as cholesterol and triglycerides).  Improves the body's use of the hormone insulin by:  Increasing the body's insulin sensitivity.  Reducing the body's insulin needs.  Decreases the risk for heart disease because exercising:  Lowers cholesterol and triglycerides levels.  Increases the levels of good cholesterol (such as high-density lipoproteins [HDL]) in the body.  Lowers blood glucose levels. YOUR ACTIVITY PLAN  Choose an activity that you enjoy, and set realistic goals. To exercise safely, you should begin practicing any new physical activity slowly, and gradually increase the intensity of the exercise over time. Your health care provider or diabetes educator can help create an activity plan that works for you. General recommendations include:  Encouraging children to engage in at least 60 minutes of physical activity each day.  Stretching and performing strength training exercises, such as yoga or weight lifting, at least 2 times per week.  Performing a total of at least 150 minutes of moderate-intensity exercise each week, such as brisk walking or water aerobics.  Exercising at least 3 days per week, making sure you allow no more than 2 consecutive days to pass without exercising.  Avoiding long periods of inactivity (90 minutes or more). When you  have to spend an extended period of time sitting down, take frequent breaks to walk or stretch. RECOMMENDATIONS FOR EXERCISING WITH TYPE 1 OR TYPE 2 DIABETES   Check your blood glucose before exercising. If blood glucose levels are greater than 240 mg/dL, check for urine ketones. Do not exercise if ketones are present.  Avoid injecting insulin into areas of the body that are going to be exercised. For example, avoid injecting insulin into:  The arms when playing tennis.  The legs when jogging.  Keep a record of:  Food intake before and after you exercise.  Expected peak times of insulin action.  Blood glucose levels before and after you exercise.  The type and amount of exercise you have done.  Review your records with your health care provider. Your health care provider will help you to develop guidelines for adjusting food intake and insulin amounts before and after exercising.  If you take insulin or oral hypoglycemic agents, watch for signs and symptoms of hypoglycemia. They include:  Dizziness.  Shaking.  Sweating.  Chills.  Confusion.  Drink plenty of water while you exercise to prevent dehydration or heat stroke. Body water is lost during exercise and must be replaced.  Talk to your health care provider before starting an exercise program to make sure it is safe for you. Remember, almost any type of activity is better than none.   This information is not intended to replace advice given to you by your health care provider. Make sure you discuss any questions you have with your health care provider.   Document Released: 08/11/2003 Document Revised: 10/05/2014 Document Reviewed: 10/28/2012 Elsevier Interactive Patient Education 2016 [employer] Basic Carbohydrate Counting for Diabetes Mellitus Carbohydrate counting   is a method for keeping track of the amount of carbohydrates you eat. Eating carbohydrates naturally increases the level of sugar (glucose) in your  blood, so it is important for you to know the amount that is okay for you to have in every meal. Carbohydrate counting helps keep the level of glucose in your blood within normal limits. The amount of carbohydrates allowed is different for every person. A dietitian can help you calculate the amount that is right for you. Once you know the amount of carbohydrates you can have, you can count the carbohydrates in the foods you want to eat. Carbohydrates are found in the following foods:  Grains, such as breads and cereals.  Dried beans and soy products.  Starchy vegetables, such as potatoes, peas, and corn.  Fruit and fruit juices.  Milk and yogurt.  Sweets and snack foods, such as cake, cookies, candy, chips, soft drinks, and fruit drinks. CARBOHYDRATE COUNTING There are two ways to count the carbohydrates in your food. You can use either of the methods or a combination of both. Reading the "Nutrition Facts" on Packaged Food The "Nutrition Facts" is an area that is included on the labels of almost all packaged food and beverages in the [place]. It includes the serving size of that food or beverage and information about the nutrients in each serving of the food, including the grams (g) of carbohydrate per serving.  Decide the number of servings of this food or beverage that you will be able to eat or drink. Multiply that number of servings by the number of grams of carbohydrate that is listed on the label for that serving. The total will be the amount of carbohydrates you will be having when you eat or drink this food or beverage. Learning Standard Serving Sizes of Food When you eat food that is not packaged or does not include "Nutrition Facts" on the label, you need to measure the servings in order to count the amount of carbohydrates.A serving of most carbohydrate-rich foods contains about 15 g of carbohydrates. The following list includes serving sizes of carbohydrate-rich foods that  provide 15 g ofcarbohydrate per serving:   1 slice of bread (1 oz) or 1 six-inch tortilla.    of a hamburger bun or English muffin.  4-6 crackers.   cup unsweetened dry cereal.    cup hot cereal.   cup rice or pasta.    cup mashed potatoes or  of a large baked potato.  1 cup fresh fruit or one small piece of fruit.    cup canned or frozen fruit or fruit juice.  1 cup milk.   cup plain fat-free yogurt or yogurt sweetened with artificial sweeteners.   cup cooked dried beans or starchy vegetable, such as peas, corn, or potatoes.  Decide the number of standard-size servings that you will eat. Multiply that number of servings by 15 (the grams of carbohydrates in that serving). For example, if you eat 2 cups of strawberries, you will have eaten 2 servings and 30 g of carbohydrates (2 servings x 15 g = 30 g). For foods such as soups and casseroles, in which more than one food is mixed in, you will need to count the carbohydrates in each food that is included. EXAMPLE OF CARBOHYDRATE COUNTING Sample Dinner  3 oz chicken breast.   cup of brown rice.   cup of corn.  1 cup milk.   1 cup strawberries with sugar-free whipped topping.  Carbohydrate Calculation Step   1: Identify the foods that contain carbohydrates:   Rice.   Corn.   Milk.   Strawberries. Step 2:Calculate the number of servings eaten of each:   2 servings of rice.   1 serving of corn.   1 serving of milk.   1 serving of strawberries. Step 3: Multiply each of those number of servings by 15 g:   2 servings of rice x 15 g = 30 g.   1 serving of corn x 15 g = 15 g.   1 serving of milk x 15 g = 15 g.   1 serving of strawberries x 15 g = 15 g. Step 4: Add together all of the amounts to find the total grams of carbohydrates eaten: 30 g + 15 g + 15 g + 15 g = 75 g.   This information is not intended to replace advice given to you by your health care provider. Make sure you  discuss any questions you have with your health care provider.   Document Released: 05/21/2005 Document Revised: 06/11/2014 Document Reviewed: 04/17/2013 Elsevier Interactive Patient Education 2016 Elsevier Inc.  

## 2015-09-29 NOTE — Progress Notes (Signed)
S:    Patient arrives in good spirits.  Presents for diabetes evaluation, education, and management at the request of Dr. Julien NordmannLangeland. Patient was referred on 09/07/15.  Patient was last seen by Primary Care Provider on 09/07/15.   Patient denies adherence with medications. She is only taking her diabetes medications, which include metformin 1000 mg BID and Lantus 15 units BID.  Patient denies hypoglycemic events.  Patient reported dietary habits: doesn't follow a particular diet.  Patient reported exercise habits: none   Patient reports nocturia.  Patient reports neuropathy. Patient denies visual changes. Patient denies self foot exams.   She reports feeling tired a lot and that the insulin causes her mouth to have a bad taste after she injects it.   Patient also reports leg pain. She has a swollen spot on her left leg that came about 1-2 weeks ago. She reports that it is warm to touch and she has treated the pain with acetaminophen and [place].     O:  Lab Results  Component Value Date   HGBA1C 10.1 08/24/2015   There were no vitals filed for this visit.  Home CBGs (fasting): high 200s Home CBGs (post-prandial): 200s-300s  Post-prandial readings: 7 day average = 275 14 day average = 284  Left leg: swelling on calf.   A/P: Diabetes currently uncontrolled based on A1c of 10.1 and home CBGs. Patient denies hypoglycemic events and is able to verbalize appropriate hypoglycemia management plan. Patient denies adherence with medication. Control is suboptimal due to sedentary lifestyle.  Change Lantus from 15 units BID (total of 30 units daily) to Lantus 35 units daily. This will lower her injection burden. Continue metformin 1000 mg BID. Discussed option of adding other agents to medication regimen but patient would prefer to stick with metformin and Lantus at this time.   Patient to be worked in to see Dr. Hyman HopesJegede for leg pain today.   Written patient instructions provided.  Total  time in face to face counseling 20 minutes.  Follow up in Pharmacist Clinic Visit as needed, next visit with Dr. Julien NordmannLangeland though will be evaluated for leg pain today.   Patient seen with Theresa MulliganMartin Shaughnessy, PharmD Candidate

## 2015-09-29 NOTE — Progress Notes (Signed)
Patient ID: Sarah Garrison, female   DOB: 09-Oct-1980, 35 y.o.   MRN: [id number]   Sarah Garrison, is a 35 y.o. female  [id number]  [id number]  DOB - [birth year]  Chief Complaint  Patient presents with  . Leg Pain        Subjective:   Sarah Garrison is a 35 y.o. female with history of type 2 diabetes mellitus without complications, hypertension, GERD, morbid obesity and asthma here today as a walk-in with complaint of swelling on the left leg at the calf area that started about a week ago. It's painful but no redness, no fever, no history of trauma. Never had a clot before. She was originally here for diabetes management with the certified pharmacy practitioner and was noticed to have the swelling on her left calf. Patient works with [employer] and [employer], does not have sedentary lifestyle, also braids hair, she considers herself very active, up and about. Patient has No headache, No chest pain, No abdominal pain - No Nausea, No new weakness tingling or numbness, No Cough - SOB.  Problem  Leg Swelling  Morbid Obesity (Hcc)    ALLERGIES: No Known Allergies  PAST MEDICAL HISTORY: Past Medical History  Diagnosis Date  . Asthma   . GERD (gastroesophageal reflux disease)   . Diabetes mellitus without complication ([place])   . Hypertension     MEDICATIONS AT HOME: Prior to Admission medications   Medication Sig Start Date End Date Taking? Authorizing Provider  albuterol (PROVENTIL HFA;VENTOLIN HFA) 108 (90 Base) MCG/ACT inhaler Inhale 2 puffs into the lungs every 6 (six) hours as needed for wheezing or shortness of breath. Reported on 09/29/2015   Yes Historical Provider, MD  amLODipine (NORVASC) 2.5 MG tablet Take 2.5 mg by mouth daily. Reported on 09/29/2015   Yes Historical Provider, MD  Blood Glucose Monitoring Suppl (ACCU-CHEK AVIVA PLUS) w/Device KIT 1 each by Does not apply route 3 (three) times daily. 09/13/15  Yes Tresa Garter, MD  butalbital-acetaminophen-caffeine  (FIORICET, ESGIC) 50-325-40 MG tablet Take 1 tablet by mouth 2 (two) times daily as needed for headache. 09/07/15  Yes Tresa Garter, MD  gabapentin (NEURONTIN) 300 MG capsule Take 1 capsule (300 mg total) by mouth 3 (three) times daily. 09/07/15  Yes Tresa Garter, MD  glucose blood test strip Use as instructed 09/13/15  Yes Tresa Garter, MD  Insulin Glargine (LANTUS SOLOSTAR) 100 UNIT/ML Solostar Pen Inject 20 Units into the skin daily at 10 pm. Patient taking differently: Inject 15 Units into the skin 2 (two) times daily.  09/14/15  Yes Danene Montijo Essie Christine, MD  Insulin Pen Needle (ULTICARE PEN NEEDLES) 29G X 12.7MM MISC 1 each by Does not apply route at bedtime. 09/07/15  Yes Tresa Garter, MD  Lancet Devices [facility]) lancets Use as instructed 09/13/15  Yes Tresa Garter, MD  Lancets (ACCU-CHEK SOFT TOUCH) lancets Use as instructed for once daily blood glucose testing 08/25/15  Yes Tresa Garter, MD  metFORMIN (GLUCOPHAGE) 1000 MG tablet Take 1 tablet (1,000 mg total) by mouth 2 (two) times daily with a meal. 08/25/15  Yes Celisse Ciulla E Doreene Burke, MD  pantoprazole (PROTONIX) 40 MG tablet Take 1 tablet (40 mg total) by mouth daily. 07/06/15  Yes Kristen N Ward, DO  chlorhexidine (PERIDEX) 0.12 % solution Use as directed 15 mLs in the mouth or throat 2 (two) times daily. Patient not taking: Reported on 09/29/2015 09/25/15   Domenic Moras, PA-C  HYDROcodone-acetaminophen (HYCET) 7.5-325 mg/15 ml  solution Take 10 mLs by mouth every 6 (six) hours as needed for moderate pain or severe pain. Patient not taking: Reported on 09/29/2015 09/25/15   Domenic Moras, PA-C     Objective:   Filed Vitals:   09/29/15 1028  BP: 133/82  Pulse: 94  Temp: 98 F (36.7 C)  TempSrc: Oral  Resp: 18  Height: '5\' 5"'$  (1.651 m)  Weight: 253 lb (114.76 kg)  SpO2: 100%    Exam General appearance : Awake, alert, not in any distress. Speech Clear. Not toxic looking, Morbidly obese HEENT:  Atraumatic and Normocephalic, pupils equally reactive to light and accomodation Neck: supple, no JVD. No cervical lymphadenopathy.  Chest:Good air entry bilaterally, no added sounds  CVS: S1 S2 regular, no murmurs.  Abdomen: Bowel sounds present, Non tender and not distended with no gaurding, rigidity or rebound. Extremities: Swelling and raised skin on left leg around the cough, no redness, mildly tender B/L Lower Ext shows no edema, both legs are warm to touch Neurology: Awake alert, and oriented X 3, CN II-XII intact, Non focal Skin:No Rash  Data Review Lab Results  Component Value Date   HGBA1C 10.1 08/24/2015     Assessment & Plan   1. Type 2 diabetes mellitus with diabetic neuropathy, unspecified long term insulin use status (HCC)  - Glucose (CBG)  Aim for 30 minutes of exercise most days. Rethink what you drink. Water is great! Aim for 2-3 Carb Choices per meal (30-45 grams) +/- 1 either way  Aim for 0-15 Carbs per snack if hungry  Include protein in moderation with your meals and snacks  Consider reading food labels for Total Carbohydrate and Fat Grams of foods  Consider checking BG at alternate times per day  Continue taking medication as directed Be mindful about how much sugar you are adding to beverages and other foods. Fruit Punch - find one with no sugar  Measure and decrease portions of carbohydrate foods  Make your plate and don't go back for seconds  2. Leg swelling  - Ultrasound doppler venous legs bilat; Future  3. Morbid obesity, unspecified obesity type ([place])  - Ultrasound doppler venous legs bilat; Future Aim for 30 minutes of exercise most days. Rethink what you drink. Water is great! Aim for 2-3 Carb Choices per meal (30-45 grams) +/- 1 either way  Aim for 0-15 Carbs per snack if hungry  Include protein in moderation with your meals and snacks  Consider reading food labels for Total Carbohydrate and Fat Grams of foods  Consider checking BG at  alternate times per day  Continue taking medication as directed Be mindful about how much sugar you are adding to beverages and other foods. Fruit Punch - find one with no sugar  Measure and decrease portions of carbohydrate foods  Make your plate and don't go back for seconds  Patient have been counseled extensively about nutrition and exercise  Return in about 3 months (around 12/29/2015), or if symptoms worsen or fail to improve, for Follow up Pain and comorbidities, Hemoglobin A1C and Follow up, DM.  The patient was given clear instructions to go to ER or return to medical center if symptoms don't improve, worsen or new problems develop. The patient verbalized understanding. The patient was told to call to get lab results if they haven't heard anything in the next week.   This note has been created with Surveyor, quantity. Any transcriptional errors are unintentional.    Shanvi Moyd,  Gabrielle Dare, MD, [place], Karilyn Cota, [place] and [facility] [place], [place]   09/29/2015, 11:08 AM

## 2015-09-30 ENCOUNTER — Other Ambulatory Visit: Payer: Self-pay | Admitting: *Deleted

## 2015-09-30 ENCOUNTER — Telehealth: Payer: Self-pay | Admitting: Internal Medicine

## 2015-09-30 ENCOUNTER — Ambulatory Visit (HOSPITAL_COMMUNITY)
Admission: RE | Admit: 2015-09-30 | Discharge: 2015-09-30 | Disposition: A | Discharge status: Home / Self Care | Payer: Medicaid Other | Source: Ambulatory Visit | Attending: Internal Medicine | Admitting: Internal Medicine

## 2015-09-30 DIAGNOSIS — E119 Type 2 diabetes mellitus without complications: Secondary | ICD-10-CM | POA: Diagnosis not present

## 2015-09-30 DIAGNOSIS — M7989 Other specified soft tissue disorders: Secondary | ICD-10-CM

## 2015-09-30 DIAGNOSIS — I10 Essential (primary) hypertension: Secondary | ICD-10-CM | POA: Diagnosis not present

## 2015-09-30 DIAGNOSIS — K219 Gastro-esophageal reflux disease without esophagitis: Secondary | ICD-10-CM | POA: Insufficient documentation

## 2015-09-30 LAB — CULTURE, GROUP A STREP (THRC)

## 2015-09-30 NOTE — Progress Notes (Signed)
*  PRELIMINARY RESULTS* Vascular Ultrasound Lower extremity venous duplex has been completed.  Preliminary findings: No evidence of DVT or baker's cyst.   Attempted to call report to MD, however office is closed for lunch. Cannot reach anyone to give results to. Will let patient leave.   Farrel DemarkJill Eunice, RDMS, RVT  09/30/2015, 1:28 PM

## 2015-09-30 NOTE — Telephone Encounter (Signed)
Patient is aware of Vas US being scheduled or today at 1:00pm. Patient expressed her understanding and had no further questions at this time.

## 2015-09-30 NOTE — Telephone Encounter (Signed)
Patient is calling in reference to orders for imaging that needs to be done, she was told she would receive a call in regards to appointment

## 2015-10-13 ENCOUNTER — Encounter: Payer: Self-pay | Admitting: Family Medicine

## 2015-10-13 ENCOUNTER — Other Ambulatory Visit: Payer: Self-pay | Admitting: Internal Medicine

## 2015-10-13 ENCOUNTER — Ambulatory Visit: Payer: Medicaid Other | Attending: Family Medicine | Admitting: Family Medicine

## 2015-10-13 VITALS — BP 132/86 | HR 104 | Temp 98.1°F | Resp 16 | Ht 65.0 in | Wt 253.2 lb

## 2015-10-13 DIAGNOSIS — E1140 Type 2 diabetes mellitus with diabetic neuropathy, unspecified: Secondary | ICD-10-CM | POA: Diagnosis not present

## 2015-10-13 DIAGNOSIS — Z794 Long term (current) use of insulin: Secondary | ICD-10-CM | POA: Insufficient documentation

## 2015-10-13 DIAGNOSIS — Z79899 Other long term (current) drug therapy: Secondary | ICD-10-CM | POA: Diagnosis not present

## 2015-10-13 DIAGNOSIS — M7989 Other specified soft tissue disorders: Secondary | ICD-10-CM | POA: Diagnosis present

## 2015-10-13 DIAGNOSIS — M79605 Pain in left leg: Secondary | ICD-10-CM | POA: Diagnosis not present

## 2015-10-13 LAB — GLUCOSE, POCT (MANUAL RESULT ENTRY): POC GLUCOSE: 239 mg/dL — AB (ref 70–99)

## 2015-10-13 MED ORDER — INSULIN GLARGINE 100 UNIT/ML SOLOSTAR PEN: 45.0000 [IU] | PEN_INJECTOR | Freq: Every day | SUBCUTANEOUS | Status: DC

## 2015-10-13 MED ORDER — NAPROXEN 500 MG PO TABS: 500.0000 mg | ORAL_TABLET | Freq: Two times a day (BID) | ORAL | Status: DC

## 2015-10-13 MED FILL — ACCU-CHEK AVIVA PLUS TEST S: 30 days supply | Qty: 100 | Fill #1

## 2015-10-13 NOTE — Progress Notes (Signed)
Patient's here for f/up hyperglycemia.  Patrient reports her legs hurting since yesterday, rated 8/10, described as shooting pain and swelling.  Requesting med refill.

## 2015-10-13 NOTE — Progress Notes (Signed)
Subjective:  Patient ID: Sarah Garrison, female    DOB: 1980-10-02  Age: 35 y.o. MRN: [id number]  CC: Diabetes and Leg Swelling   HPI Sarah Garrison is 35 year old female with past medical history significant for T2DM w/ diabetic neuropathy and morbid obesity presents for high blood sugar and left leg pain. Patient reports that prior to her insulin regimen last visit w/ PCP, she was taking 15 units of insulin twice a day but CBG was in the 300s in the morning so they changed it 35 units daily. She now reports that she is taking 35 units daily as discussed in last visit but her CBG averages around 300 when she takes it randomly and is greater than 200 when she wakes up. Patient says she is compliant with taking Garrison Metformin and Insulin. She reports that she is eating gluten free foods and salads and that she is walking at least for an hour everyday for exercise. She reports polydipsia this morning.  She denies any fevers, weight loss, nausea and vomiting.  Patient reports lower left leg pain and swelling that started yesterday. She describes it as a shooting pain that starts from the back of the knee and radiates to her toes. She says that it is constant and reports that nothing alleviates the pain, but walking makes the pain worse. She describes her leg as heavy feeling when she tries to move it. She denies any trauma to the leg.   Outpatient Prescriptions Prior to Visit  Medication Sig Dispense Refill  . albuterol (PROVENTIL HFA;VENTOLIN HFA) 108 (90 Base) MCG/ACT inhaler Inhale 2 puffs into the lungs every 6 (six) hours as needed for wheezing or shortness of breath. Reported on 09/29/2015    . amLODipine (NORVASC) 2.5 MG tablet Take 2.5 mg by mouth daily. Reported on 09/29/2015    . Blood Glucose Monitoring Suppl (ACCU-CHEK AVIVA PLUS) w/Device KIT 1 each by Does not apply route 3 (three) times daily. 1 kit 0  . butalbital-acetaminophen-caffeine (FIORICET, ESGIC) 50-325-40 MG tablet Take 1  tablet by mouth 2 (two) times daily as needed for headache. 14 tablet 0  . gabapentin (NEURONTIN) 300 MG capsule Take 1 capsule (300 mg total) by mouth 3 (three) times daily. 90 capsule 1  . glucose blood test strip Use as instructed 100 each 12  . Insulin Pen Needle (ULTICARE PEN NEEDLES) 29G X 12.7MM MISC 1 each by Does not apply route at bedtime. 100 each 2  . Lancet Devices (ACCU-CHEK SOFTCLIX) lancets Use as instructed 1 each 0  . Lancets (ACCU-CHEK SOFT TOUCH) lancets Use as instructed for once daily blood glucose testing 100 each 12  . metFORMIN (GLUCOPHAGE) 1000 MG tablet Take 1 tablet (1,000 mg total) by mouth 2 (two) times daily with a meal. 180 tablet 3  . pantoprazole (PROTONIX) 40 MG tablet Take 1 tablet (40 mg total) by mouth daily. 30 tablet 1  . Insulin Glargine (LANTUS SOLOSTAR) 100 UNIT/ML Solostar Pen Inject 20 Units into the skin daily at 10 pm. (Patient taking differently: Inject 15 Units into the skin 2 (two) times daily. ) 15 mL 3  . chlorhexidine (PERIDEX) 0.12 % solution Use as directed 15 mLs in the mouth or throat 2 (two) times daily. (Patient not taking: Reported on 09/29/2015) 120 mL 0  . HYDROcodone-acetaminophen (HYCET) 7.5-325 mg/15 ml solution Take 10 mLs by mouth every 6 (six) hours as needed for moderate pain or severe pain. (Patient not taking: Reported on 09/29/2015) 100  mL 0   No facility-administered medications prior to visit.    ROS Review of Systems  Constitutional: Negative for fever, fatigue and unexpected weight change.  HENT: Negative.   Respiratory: Negative for cough and shortness of breath.   Cardiovascular: Positive for leg swelling. Negative for chest pain.  Gastrointestinal: Negative for nausea, vomiting, diarrhea and constipation.  Endocrine: Positive for polydipsia. Negative for polyphagia and polyuria.  Musculoskeletal:       Positive for leg pain  Neurological: Negative for light-headedness, numbness and headaches.    Psychiatric/Behavioral: Negative.     Objective:  BP 132/86 mmHg  Pulse 104  Temp(Src) 98.1 F (36.7 C) (Oral)  Resp 16  Ht _0  (1.651 m)  Wt 253 lb 3.2 oz (114.851 kg)  BMI 42.13 kg/m2  SpO2 100%  LMP 09/08/2015 (Exact Date)  BP/Weight 10/13/2015 09/29/2015 3/71/6967  Systolic BP 893 810 175  Diastolic BP 86 82 85  Wt. (Lbs) 253.2 253 -  BMI 42.13 42.1 -    Lab Results  Component Value Date   WBC 7.5 08/30/2015   HGB 13.2 08/30/2015   HCT 39.3 08/30/2015   PLT 320 08/30/2015   GLUCOSE 221* 08/30/2015   ALT 20 08/30/2015   AST 19 08/30/2015   NA 137 08/30/2015   K 3.9 08/30/2015   CL 108 08/30/2015   CREATININE 0.82 08/30/2015   BUN 16 08/30/2015   CO2 20* 08/30/2015   HGBA1C 10.1 08/24/2015   Diabetes Data  Sarah Garrison is compliant with ADA diet.  She is compliant with medications. She is compliant with exercise. Exercise: Home exercise routine includes walking 1 hrs per day..  Home blood sugar records: fasting range: 240-500  Diabetic Labs Latest Ref Rng 08/30/2015 08/24/2015 08/23/2015  HbA1c - - 10.1 -  Creatinine 0.44 - 1.00 mg/dL 0.82 - 0.76   No flowsheet data found.  Lab Results  Component Value Date   POCGLU 239* 10/13/2015    Diabetes Health Maintenance Due  Topic Date Due  . FOOT EXAM  02/01/1992  . URINE MICROALBUMIN  02/01/1992  . HEMOGLOBIN A1C  02/24/2016  . OPHTHALMOLOGY EXAM  10/02/2016   Pneumococcal Immunization Status  Topic Date Due  . PNEUMOCOCCAL POLYSACCHARIDE VACCINE (1) 02/01/1984       Physical Exam  Constitutional: She appears well-developed and well-nourished.  Cardiovascular: Regular rhythm, normal heart sounds and intact distal pulses.  Exam reveals no gallop and no friction rub.   No murmur heard. Tachycardic  Pulmonary/Chest: Breath sounds normal. She has no wheezes. She has no rales. She exhibits no tenderness.  Abdominal: Soft. Bowel sounds are normal. She exhibits no distension. There is no tenderness.  There is no rebound and no guarding.  Musculoskeletal: She exhibits edema and tenderness.  Patient is tender to touch behind and below the left knee all the way to the sole of her left foot ROM intact but patient feels a lot of tenderness when moving left leg in any direction Right leg has normal ROM and no tenderness.  Skin: Skin is warm and dry. No rash noted. No erythema.  Psychiatric: She has a normal mood and affect. Her behavior is normal. Thought content normal.     Assessment & Plan:   1. Type 2 diabetes mellitus with diabetic neuropathy, unspecified long term insulin use status (HCC) CBG ranges from 240 to 500 To keep under better control, increased the insulin to 45 units Advised and encouraged patient to keep maintaining her current diet and exercise regimen  Will have patient follow-up with PCP in 2 weeks - Glucose (CBG) - Microalbumin/Creatinine Ratio, Urine - Insulin Glargine (LANTUS SOLOSTAR) 100 UNIT/ML Solostar Pen; Inject 45 Units into the skin daily at 10 pm. Discontinue previous dose  Dispense: 15 mL; Refill: 3  2. Leg swelling Left leg pain and swelling due to diabetic neuropathy. Patient is compliant with taking gabapentin.  Refilled naproxen for pain use. Will have patient follow-up with PCP in 2 weeks - naproxen (NAPROSYN) 500 MG tablet; Take 1 tablet (500 mg total) by mouth 2 (two) times daily with a meal.  Dispense: 30 tablet; Refill: 0  3. Healthcare Maintenance  Patient needs Foot Exam, Microalbumin, and Pneumoccocal vaccine.  Will have PCP follow-up for her healthcare maintenance.  Meds ordered this encounter  Medications  . Insulin Glargine (LANTUS SOLOSTAR) 100 UNIT/ML Solostar Pen    Sig: Inject 45 Units into the skin daily at 10 pm. Discontinue previous dose    Dispense:  15 mL    Refill:  3    Insulin was ruined - please refill  . naproxen (NAPROSYN) 500 MG tablet    Sig: Take 1 tablet (500 mg total) by mouth 2 (two) times daily with a meal.     Dispense:  30 tablet    Refill:  0    Follow-up: Return in about 2 weeks (around 10/27/2015) for Diabetic follow-up with PCP- Dr. Janne Napoleon.   Lorrie Gargan Adeolu [employer]

## 2015-10-13 NOTE — Patient Instructions (Signed)

## 2015-10-14 LAB — MICROALBUMIN / CREATININE URINE RATIO
Creatinine, Urine: 232 mg/dL (ref 20–320)
Microalb Creat Ratio: 4 mcg/mg creat (ref <30)
Microalb, Ur: 0.9 mg/dL

## 2015-10-27 ENCOUNTER — Encounter: Payer: Self-pay | Admitting: Internal Medicine

## 2015-11-01 ENCOUNTER — Ambulatory Visit: Payer: Medicaid Other | Admitting: Internal Medicine

## 2015-11-08 ENCOUNTER — Telehealth: Payer: Self-pay | Admitting: *Deleted

## 2015-11-08 NOTE — Telephone Encounter (Signed)
-----   Message from Quentin Angstlugbemiga E Jegede, MD sent at 10/03/2015 12:53 PM EDT ----- Please inform patient that her lower extremity venous ultrasound showed- No evidence of deep vein thrombosis and - No evidence of Baker's cyst on the right or left.

## 2015-11-08 NOTE — Telephone Encounter (Signed)
Medical Assistant left message on patient's home and cell voicemail. Voicemail states to give a call back to Makita Blow with [facility] at [phone].  

## 2015-11-21 MED FILL — ACCU-CHEK SOFTCLIX LANCETS: 25 days supply | Qty: 100 | Fill #0

## 2015-11-21 MED FILL — ULTICARE PEN NDL 4MM 32G: 32G X 4 MM | 30 days supply | Qty: 100 | Fill #1

## 2015-11-21 MED FILL — LANTUS SOLOSTAR 100 UNITS/M: 100 | 30 days supply | Qty: 6 | Fill #2

## 2015-11-21 MED FILL — ACCU-CHEK AVIVA PLUS TEST S: 30 days supply | Qty: 100 | Fill #2

## 2015-12-14 ENCOUNTER — Emergency Department (HOSPITAL_COMMUNITY): Payer: Medicaid Other

## 2015-12-14 ENCOUNTER — Emergency Department (HOSPITAL_BASED_OUTPATIENT_CLINIC_OR_DEPARTMENT_OTHER): Payer: Medicaid Other

## 2015-12-14 ENCOUNTER — Emergency Department (HOSPITAL_COMMUNITY)
Admission: EM | Admit: 2015-12-14 | Discharge: 2015-12-14 | Disposition: A | Discharge status: Home / Self Care | Payer: Medicaid Other | Attending: Emergency Medicine | Admitting: Emergency Medicine

## 2015-12-14 ENCOUNTER — Encounter (HOSPITAL_COMMUNITY): Payer: Self-pay

## 2015-12-14 DIAGNOSIS — Z794 Long term (current) use of insulin: Secondary | ICD-10-CM | POA: Diagnosis not present

## 2015-12-14 DIAGNOSIS — Y990 Civilian activity done for income or pay: Secondary | ICD-10-CM | POA: Diagnosis not present

## 2015-12-14 DIAGNOSIS — I10 Essential (primary) hypertension: Secondary | ICD-10-CM | POA: Diagnosis not present

## 2015-12-14 DIAGNOSIS — J45909 Unspecified asthma, uncomplicated: Secondary | ICD-10-CM | POA: Insufficient documentation

## 2015-12-14 DIAGNOSIS — M79609 Pain in unspecified limb: Secondary | ICD-10-CM | POA: Diagnosis not present

## 2015-12-14 DIAGNOSIS — Y929 Unspecified place or not applicable: Secondary | ICD-10-CM | POA: Diagnosis not present

## 2015-12-14 DIAGNOSIS — Z79899 Other long term (current) drug therapy: Secondary | ICD-10-CM | POA: Insufficient documentation

## 2015-12-14 DIAGNOSIS — Z87891 Personal history of nicotine dependence: Secondary | ICD-10-CM | POA: Diagnosis not present

## 2015-12-14 DIAGNOSIS — X500XXA Overexertion from strenuous movement or load, initial encounter: Secondary | ICD-10-CM | POA: Insufficient documentation

## 2015-12-14 DIAGNOSIS — Y9389 Activity, other specified: Secondary | ICD-10-CM | POA: Diagnosis not present

## 2015-12-14 DIAGNOSIS — M25512 Pain in left shoulder: Secondary | ICD-10-CM

## 2015-12-14 DIAGNOSIS — E119 Type 2 diabetes mellitus without complications: Secondary | ICD-10-CM | POA: Insufficient documentation

## 2015-12-14 LAB — CBC WITH DIFFERENTIAL/PLATELET
Basophils Absolute: 0 10*3/uL (ref 0.0–0.1)
Basophils Relative: 0 %
Eosinophils Absolute: 0.1 10*3/uL (ref 0.0–0.7)
Eosinophils Relative: 1 %
HCT: 38.4 % (ref 36.0–46.0)
Hemoglobin: 12.9 g/dL (ref 12.0–15.0)
Lymphocytes Relative: 40 %
Lymphs Abs: 2.8 10*3/uL (ref 0.7–4.0)
MCH: 28.9 pg (ref 26.0–34.0)
MCHC: 33.6 g/dL (ref 30.0–36.0)
MCV: 85.9 fL (ref 78.0–100.0)
Monocytes Absolute: 0.5 10*3/uL (ref 0.1–1.0)
Monocytes Relative: 7 %
Neutro Abs: 3.6 10*3/uL (ref 1.7–7.7)
Neutrophils Relative %: 52 %
Platelets: 336 10*3/uL (ref 150–400)
RBC: 4.47 MIL/uL (ref 3.87–5.11)
RDW: 12.8 % (ref 11.5–15.5)
WBC: 6.9 10*3/uL (ref 4.0–10.5)

## 2015-12-14 LAB — BASIC METABOLIC PANEL
Anion gap: 5 (ref 5–15)
BUN: 12 mg/dL (ref 6–20)
CO2: 26 mmol/L (ref 22–32)
Calcium: 9 mg/dL (ref 8.9–10.3)
Chloride: 108 mmol/L (ref 101–111)
Creatinine, Ser: 0.68 mg/dL (ref 0.44–1.00)
GFR calc Af Amer: >60 mL/min (ref >60)
GFR calc non Af Amer: >60 mL/min (ref >60)
Glucose, Bld: 185 mg/dL — ABNORMAL HIGH (ref 65–99)
Potassium: 4 mmol/L (ref 3.5–5.1)
Sodium: 139 mmol/L (ref 135–145)

## 2015-12-14 LAB — CBG MONITORING, ED: GLUCOSE-CAPILLARY: 247 mg/dL — AB (ref 65–99)

## 2015-12-14 MED ORDER — IBUPROFEN 600 MG PO TABS: 600.0000 mg | ORAL_TABLET | Freq: Three times a day (TID) | ORAL | Status: DC | PRN

## 2015-12-14 MED ORDER — HYDROMORPHONE HCL 1 MG/ML IJ SOLN
1.0000 mg | Freq: Once | INTRAMUSCULAR | Status: AC
  Administered 2015-12-14: 1 mg via INTRAVENOUS
  Filled 2015-12-14: qty 1

## 2015-12-14 MED ORDER — CYCLOBENZAPRINE HCL 10 MG PO TABS: 10.0000 mg | ORAL_TABLET | Freq: Three times a day (TID) | ORAL | Status: DC | PRN

## 2015-12-14 MED ORDER — MORPHINE SULFATE (PF) 4 MG/ML IV SOLN
4.0000 mg | Freq: Once | INTRAVENOUS | Status: AC
  Administered 2015-12-14: 4 mg via INTRAVENOUS
  Filled 2015-12-14: qty 1

## 2015-12-14 MED ORDER — DIAZEPAM 5 MG PO TABS
5.0000 mg | ORAL_TABLET | Freq: Once | ORAL | Status: AC
  Administered 2015-12-14: 5 mg via ORAL
  Filled 2015-12-14: qty 1

## 2015-12-14 MED ORDER — KETOROLAC TROMETHAMINE 30 MG/ML IJ SOLN
30.0000 mg | Freq: Once | INTRAMUSCULAR | Status: AC
  Administered 2015-12-14: 30 mg via INTRAVENOUS
  Filled 2015-12-14: qty 1

## 2015-12-14 NOTE — ED Provider Notes (Signed)
CSN: [id number]     Arrival date & time 12/14/15  0935 History   First MD Initiated Contact with Patient 12/14/15 1016     Chief Complaint  Patient presents with  . Arm Pain  . Arm Swelling     (Consider location/radiation/quality/duration/timing/severity/associated sxs/prior Treatment) HPI Comments: Patient is a 35 year old female with history of hypertension and diabetes who presents with sudden onset left arm pain and swelling that began around 3 AM. Patient states that the pain and swelling began around her shoulder and moved down to her hand and fingers. Patient also states a darker color change in comparison to her right arm. 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 Patient denies any injury. Patient states he is tried ice, heat, Tylenol without relief. Patient also injected her daily insulin at 4:45 AM. Patient denies any other new medicines. Patient denies any chest pain, shortness of breath, abdominal pain, nausea, vomiting, dysuria.   Patient is a 35 y.o. female presenting with arm pain. The history is provided by the patient.  Arm Pain Pertinent negatives include no abdominal pain, chest pain, chills, fever, headaches, nausea, neck pain, rash, sore throat or vomiting.    Past Medical History  Diagnosis Date  . Asthma   . GERD (gastroesophageal reflux disease)   . Diabetes mellitus without complication ([place])   . Hypertension    Past Surgical History  Procedure Laterality Date  . Cesarean section    . Tubal ligation    . Wisdom tooth extraction     Family History  Problem Relation Age of Onset  . Asthma Mother   . Asthma Father    Social History  Substance Use Topics  . Smoking status: Former Smoker -- 0.00 packs/day    Types: Cigarettes    Quit date: 11/02/2013  . Smokeless tobacco: Never Used  . Alcohol Use: No   OB History    Gravida Para Term Preterm AB TAB SAB Ectopic Multiple Living     5 4   1  1   4      Review of Systems  Constitutional: Negative for fever and chills.  HENT: Negative for facial swelling and sore throat.   Respiratory: Negative for shortness of breath.   Cardiovascular: Negative for chest pain.  Gastrointestinal: Negative for nausea, vomiting and abdominal pain.  Genitourinary: Negative for dysuria.  Musculoskeletal: Negative for back pain, neck pain and neck stiffness.  Skin: Positive for color change (L arm darker, erythema). Negative for rash and wound.  Neurological: Negative for headaches.  Psychiatric/Behavioral: The patient is not nervous/anxious.       Allergies  Metformin and related and Glyburide  Home Medications   Prior to Admission medications   Medication Sig Start Date End Date Taking? Authorizing Provider  ACCU-CHEK SOFTCLIX LANCETS lancets USE AS INSTRUCTED 10/14/15  Yes Arnoldo Morale, MD  albuterol (PROVENTIL HFA;VENTOLIN HFA) 108 (90 Base) MCG/ACT inhaler Inhale 2 puffs into the lungs every 6 (six) hours as needed for wheezing or shortness of breath. Reported on 09/29/2015   Yes Historical Provider, MD  amLODipine (NORVASC) 2.5 MG tablet Take 2.5 mg by mouth daily. Reported on 09/29/2015   Yes Historical Provider, MD  b complex vitamins capsule Take 1 capsule by mouth daily.   Yes Historical Provider, MD  Blood Glucose Monitoring Suppl (ACCU-CHEK AVIVA PLUS) w/Device KIT 1 each by Does not apply route 3 (three) times daily. 09/13/15  Yes  Tresa Garter, MD  butalbital-acetaminophen-caffeine (FIORICET, ESGIC) 862 215 3102 MG tablet Take 1 tablet by mouth 2 (two) times daily as needed for headache. 09/07/15  Yes Tresa Garter, MD  calcium carbonate (TUMS - DOSED IN MG ELEMENTAL CALCIUM) 500 MG chewable tablet Chew 3 tablets by mouth 4 (four) times daily as needed for indigestion or heartburn.   Yes Historical Provider, MD  gabapentin (NEURONTIN) 300 MG capsule Take 1 capsule (300 mg total) by mouth 3 (three) times daily. 09/07/15   Yes Tresa Garter, MD  glucose blood test strip Use as instructed 09/13/15  Yes Tresa Garter, MD  Insulin Glargine (LANTUS SOLOSTAR) 100 UNIT/ML Solostar Pen Inject 45 Units into the skin daily at 10 pm. Discontinue previous dose 10/13/15  Yes Arnoldo Morale, MD  Insulin Pen Needle (ULTICARE PEN NEEDLES) 29G X 12.7MM MISC 1 each by Does not apply route at bedtime. 09/07/15  Yes Tresa Garter, MD  Lancets (ACCU-CHEK SOFT TOUCH) lancets Use as instructed for once daily blood glucose testing 08/25/15  Yes Tresa Garter, MD  Multiple Vitamins-Minerals (CENTRUM WOMEN PO) Take 1 tablet by mouth daily.   Yes Historical Provider, MD  pantoprazole (PROTONIX) 40 MG tablet Take 1 tablet (40 mg total) by mouth daily. 07/06/15  Yes Kristen N Ward, DO  cyclobenzaprine (FLEXERIL) 10 MG tablet Take 1 tablet (10 mg total) by mouth 3 (three) times daily as needed for muscle spasms. 12/14/15   Jola Schmidt, MD  ibuprofen (ADVIL,MOTRIN) 600 MG tablet Take 1 tablet (600 mg total) by mouth every 8 (eight) hours as needed. 12/14/15   Jola Schmidt, MD  metFORMIN (GLUCOPHAGE) 1000 MG tablet Take 1 tablet (1,000 mg total) by mouth 2 (two) times daily with a meal. Patient not taking: Reported on 12/14/2015 08/25/15   Tresa Garter, MD   BP 150/101 mmHg  Pulse 84  Temp(Src) 97.5 F (36.4 C) (Oral)  Resp 16  Ht 5' 4"  (1.626 m)  Wt 105.235 kg  BMI 39.80 kg/m2  SpO2 94%  LMP 12/07/2015 Physical Exam  Constitutional: She is oriented to person, place, and time. She appears well-developed and well-nourished. No distress.  HENT:  Head: Normocephalic and atraumatic.  Mouth/Throat: Oropharynx is clear and moist. No oropharyngeal exudate.  Eyes: Conjunctivae are normal. Pupils are equal, round, and reactive to light. Right eye exhibits no discharge. Left eye exhibits no discharge. No scleral icterus.  Neck: Normal range of motion. Neck supple. No thyromegaly present.  Cardiovascular: Normal rate, regular  rhythm, normal heart sounds and intact distal pulses.  Exam reveals no gallop and no friction rub.   No murmur heard. Pulmonary/Chest: Effort normal and breath sounds normal. No stridor. No respiratory distress. She has no wheezes. She has no rales.  Abdominal: Soft. Bowel sounds are normal. She exhibits no distension. There is no tenderness. There is no rebound and no guarding.  Musculoskeletal: She exhibits no edema.       Left shoulder: She exhibits decreased range of motion and tenderness. She exhibits normal pulse.       Arms: Diffuse tenderness to palpation and light touch of left arm; 5/5 strength in bilateral upper extremities, however left decreased in comparison to right; decreased grip strength in left side; normal sensation to bilateral upper extremities; left UE darker and more erythematous compared to right side; pain with all movements of the upper extremity; intact radial pulses bilaterally; no warmth of skin noted  Lymphadenopathy:    She has no cervical adenopathy.  Neurological: She is alert and oriented to person, place, and time. Coordination normal.  Skin: Skin is warm and dry. No rash noted. She is not diaphoretic. No pallor.  Psychiatric: She has a normal mood and affect. Her behavior is normal. Judgment and thought content normal.  Nursing note and vitals reviewed.   ED Course  Procedures (including critical care time) Labs Review Labs Reviewed  BASIC METABOLIC PANEL - Abnormal; Notable for the following:    Glucose, Bld 185 (*)    All other components within normal limits  CBG MONITORING, ED - Abnormal; Notable for the following:    Glucose-Capillary 247 (*)    All other components within normal limits  CBC WITH DIFFERENTIAL/PLATELET    Imaging Review Dg Shoulder Left  12/14/2015  CLINICAL DATA:  Shoulder pain radiating to the fingers. EXAM: LEFT SHOULDER - 2+ VIEW COMPARISON:  None. FINDINGS: There is no evidence of fracture or dislocation. There is no  evidence of arthropathy or other focal bone abnormality. Soft tissues are unremarkable. IMPRESSION: No acute osseous injury of the left shoulder. Electronically Signed   By: Kathreen Devoid   On: 12/14/2015 15:59   I have personally reviewed and evaluated these images and lab results as part of my medical decision-making.   EKG Interpretation None      1437 Pain not improved at all with morphine. I will order 25m Dilaudid.  MDM   Patient seen in ED frequently. Suspect musculoskeletal etiology, doubt cellulitis due to sudden onset. CBC unremarkable. BMP shows glucose 185. Left shoulder x-ray negative. Venous Doppler of upper extremity negative for DVT. Patient also evaluated by Dr. CVenora Mapleswho guided the patient's management. We will discharge patient home with Flexeril and ibuprofen with shoulder sling and follow-up to PCP. Patient given work note. Patient vitals stable throughout ED course and discharged in satisfactory condition.  Final diagnoses:  Left shoulder pain        AFrederica Kuster PA-C 12/14/15 1[place] MD 12/15/15 1220

## 2015-12-14 NOTE — Discharge Instructions (Signed)

## 2015-12-14 NOTE — ED Notes (Addendum)
Pt presents with c/o of left arm pain. Sudden onset after injection after insulin inject at 4am. Pt works 3rd shift. Takes insulin in stomach. Left arm pain radiates to fingers. Generalized swelling. Good CMS. Denies injury. Denies insult. Pt unable to verbalize cause. cbg this am 327

## 2015-12-14 NOTE — Progress Notes (Signed)
*  PRELIMINARY RESULTS* Vascular Ultrasound Left upper extremity venous duplex has been completed.  Preliminary findings: No evidence of DVT or superficial thrombosis.    Farrel DemarkJill Eunice, RDMS, RVT  12/14/2015, 2:16 PM

## 2015-12-14 NOTE — Progress Notes (Signed)
Pt with [facility] 10 ED visits an 0 admissions in last 6 months [facility]  Office visit noted in EPIC on 10/13/15  No ED CP Email to [place]  Pt not listed in EPIC [facility]

## 2016-01-10 ENCOUNTER — Encounter: Payer: Self-pay | Admitting: Internal Medicine

## 2016-01-10 ENCOUNTER — Ambulatory Visit: Payer: Medicaid Other | Attending: Internal Medicine | Admitting: Internal Medicine

## 2016-01-10 VITALS — BP 134/84 | HR 90 | Temp 98.3°F | Resp 16 | Wt 256.2 lb

## 2016-01-10 DIAGNOSIS — E119 Type 2 diabetes mellitus without complications: Secondary | ICD-10-CM | POA: Diagnosis not present

## 2016-01-10 DIAGNOSIS — K219 Gastro-esophageal reflux disease without esophagitis: Secondary | ICD-10-CM | POA: Insufficient documentation

## 2016-01-10 DIAGNOSIS — J4541 Moderate persistent asthma with (acute) exacerbation: Secondary | ICD-10-CM | POA: Diagnosis not present

## 2016-01-10 DIAGNOSIS — E1140 Type 2 diabetes mellitus with diabetic neuropathy, unspecified: Secondary | ICD-10-CM

## 2016-01-10 DIAGNOSIS — Z885 Allergy status to narcotic agent status: Secondary | ICD-10-CM | POA: Diagnosis not present

## 2016-01-10 DIAGNOSIS — M79602 Pain in left arm: Secondary | ICD-10-CM | POA: Diagnosis not present

## 2016-01-10 DIAGNOSIS — Z79899 Other long term (current) drug therapy: Secondary | ICD-10-CM | POA: Insufficient documentation

## 2016-01-10 DIAGNOSIS — Z794 Long term (current) use of insulin: Secondary | ICD-10-CM | POA: Diagnosis not present

## 2016-01-10 DIAGNOSIS — M7989 Other specified soft tissue disorders: Secondary | ICD-10-CM | POA: Insufficient documentation

## 2016-01-10 DIAGNOSIS — Z7984 Long term (current) use of oral hypoglycemic drugs: Secondary | ICD-10-CM | POA: Diagnosis not present

## 2016-01-10 DIAGNOSIS — R0789 Other chest pain: Secondary | ICD-10-CM | POA: Diagnosis not present

## 2016-01-10 DIAGNOSIS — I10 Essential (primary) hypertension: Secondary | ICD-10-CM | POA: Insufficient documentation

## 2016-01-10 LAB — POCT GLYCOSYLATED HEMOGLOBIN (HGB A1C): HEMOGLOBIN A1C: 9

## 2016-01-10 MED ORDER — MOMETASONE FURO-FORMOTEROL FUM 100-5 MCG/ACT IN AERO: 2.0000 | INHALATION_SPRAY | Freq: Two times a day (BID) | RESPIRATORY_TRACT | 3 refills | Status: DC

## 2016-01-10 MED ORDER — IPRATROPIUM-ALBUTEROL 0.5-2.5 (3) MG/3ML IN SOLN: 3.0000 mL | Freq: Four times a day (QID) | RESPIRATORY_TRACT | Status: DC

## 2016-01-10 MED ORDER — ALBUTEROL SULFATE HFA 108 (90 BASE) MCG/ACT IN AERS: 2.0000 | INHALATION_SPRAY | Freq: Four times a day (QID) | RESPIRATORY_TRACT | 12 refills | Status: DC | PRN

## 2016-01-10 MED ORDER — PREDNISONE 20 MG PO TABS: 40.0000 mg | ORAL_TABLET | Freq: Every day | ORAL | 0 refills | Status: DC

## 2016-01-10 MED FILL — DULERA 100 MCG/5 MCG INH: 100-5 | 30 days supply | Qty: 13 | Fill #0

## 2016-01-10 MED FILL — PROAIR HFA 90 MCG INHALER: 108 (90 BAS | 30 days supply | Qty: 9 | Fill #0

## 2016-01-10 MED FILL — predniSONE 20 MG TABS: 20 | 9 days supply | Qty: 13 | Fill #0

## 2016-01-10 NOTE — Patient Instructions (Signed)
Asthma, Adult Asthma is a condition of the lungs in which the airways tighten and narrow. Asthma can make it hard to breathe. Asthma cannot be cured, but medicine and lifestyle changes can help control it. Asthma may be started (triggered) by:  Animal skin flakes (dander).  Dust.  Cockroaches.  Pollen.  Mold.  Smoke.  Cleaning products.  Hair sprays or aerosol sprays.  Paint fumes or strong smells.  Cold air, weather changes, and winds.  Crying or laughing hard.  Stress.  Certain medicines or drugs.  Foods, such as dried fruit, potato chips, and sparkling grape juice.  Infections or conditions (colds, flu).  Exercise.  Certain medical conditions or diseases.  Exercise or tiring activities. HOME CARE   Take medicine as told by your doctor.  Use a peak flow meter as told by your doctor. A peak flow meter is a tool that measures how well the lungs are working.  Record and keep track of the peak flow meter's readings.  Understand and use the asthma action plan. An asthma action plan is a written plan for taking care of your asthma and treating your attacks.  To help prevent asthma attacks:  Do not smoke. Stay away from secondhand smoke.  Change your heating and air conditioning filter often.  Limit your use of fireplaces and wood stoves.  Get rid of pests (such as roaches and mice) and their droppings.  Throw away plants if you see mold on them.  Clean your floors. Dust regularly. Use cleaning products that do not smell.  Have someone vacuum when you are not home. Use a vacuum cleaner with a HEPA filter if possible.  Replace carpet with wood, tile, or vinyl flooring. Carpet can trap animal skin flakes and dust.  Use allergy-proof pillows, mattress covers, and box spring covers.  Wash bed sheets and blankets every week in hot water and dry them in a dryer.  Use blankets that are made of polyester or cotton.  Clean bathrooms and kitchens with bleach.  If possible, have someone repaint the walls in these rooms with mold-resistant paint. Keep out of the rooms that are being cleaned and painted.  Wash hands often. GET HELP IF:  You have make a whistling sound when breaking (wheeze), have shortness of breath, or have a cough even if taking medicine to prevent attacks.  The colored mucus you cough up (sputum) is thicker than usual.  The colored mucus you cough up changes from clear or white to yellow, green, gray, or bloody.  You have problems from the medicine you are taking such as:  A rash.  Itching.  Swelling.  Trouble breathing.  You need reliever medicines more than 2-3 times a week.  Your peak flow measurement is still at 50-79% of your personal best after following the action plan for 1 hour.  You have a fever. GET HELP RIGHT AWAY IF:   You seem to be worse and are not responding to medicine during an asthma attack.  You are short of breath even at rest.  You get short of breath when doing very little activity.  You have trouble eating, drinking, or talking.  You have chest pain.  You have a fast heartbeat.  Your lips or fingernails start to turn blue.  You are light-headed, dizzy, or faint.  Your peak flow is less than 50% of your personal best.   This information is not intended to replace advice given to you by your health care provider. Make sure   you discuss any questions you have with your health care provider.   Document Released: 11/07/2007 Document Revised: 02/09/2015 Document Reviewed: 12/18/2012 Elsevier Interactive Patient Education 2016 [employer]  

## 2016-01-10 NOTE — Addendum Note (Signed)
Addended by: Areta HaberMOREHEAD, Syniyah Bourne B on: 01/10/2016 05:57 PM   Modules accepted: Orders

## 2016-01-10 NOTE — Progress Notes (Signed)
Sarah Garrison, is a 35 y.o. female  [id number]  [id number]  DOB - May 16, 1981  Chief Complaint  Patient presents with  . Follow-up    ED         Subjective:   Sarah Garrison is a 35 y.o. female here today for a follow up visit for sob and left arm pain. She was seen in ED on 7/12 for left arm swelling, thought muscular at time, and was given numerous doses of morphine per pt. The morphine made her very sob.  She remains very SBO w/ chest tightness since. Increase doe.  Right now her chest feels very tight, she can only walk very short distances now due to the doe.  No f/c. Ran out of her abuterol inh.   Patient has No headache, No chest pain, No abdominal pain - No Nausea, No new weakness tingling or numbness, No Cough.  No problems updated.  ALLERGIES: Allergies  Allergen Reactions  . Metformin And Related Diarrhea  . Glyburide Diarrhea    Nose bleeds, vision disruption    PAST MEDICAL HISTORY: Past Medical History:  Diagnosis Date  . Asthma   . Diabetes mellitus without complication ([place])   . GERD (gastroesophageal reflux disease)   . Hypertension     MEDICATIONS AT HOME: Prior to Admission medications   Medication Sig Start Date End Date Taking? Authorizing Provider  ACCU-CHEK SOFTCLIX LANCETS lancets USE AS INSTRUCTED 10/14/15  Yes Arnoldo Morale, MD  albuterol (PROVENTIL HFA;VENTOLIN HFA) 108 (90 Base) MCG/ACT inhaler Inhale 2 puffs into the lungs every 6 (six) hours as needed for wheezing or shortness of breath. Reported on 09/29/2015 01/10/16  Yes Maren Reamer, MD  amLODipine (NORVASC) 2.5 MG tablet Take 2.5 mg by mouth daily. Reported on 09/29/2015   Yes Historical Provider, MD  b complex vitamins capsule Take 1 capsule by mouth daily.   Yes Historical Provider, MD  Blood Glucose Monitoring Suppl (ACCU-CHEK AVIVA PLUS) w/Device KIT 1 each by Does not apply route 3 (three) times daily. 09/13/15  Yes Tresa Garter, MD  gabapentin (NEURONTIN) 300  MG capsule Take 1 capsule (300 mg total) by mouth 3 (three) times daily. 09/07/15  Yes Tresa Garter, MD  glucose blood test strip Use as instructed 09/13/15  Yes Olugbemiga E Doreene Burke, MD  ibuprofen (ADVIL,MOTRIN) 600 MG tablet Take 1 tablet (600 mg total) by mouth every 8 (eight) hours as needed. 12/14/15  Yes Jola Schmidt, MD  Insulin Glargine (LANTUS SOLOSTAR) 100 UNIT/ML Solostar Pen Inject 45 Units into the skin daily at 10 pm. Discontinue previous dose 10/13/15  Yes Arnoldo Morale, MD  Insulin Pen Needle (ULTICARE PEN NEEDLES) 29G X 12.7MM MISC 1 each by Does not apply route at bedtime. 09/07/15  Yes Tresa Garter, MD  Lancets (ACCU-CHEK SOFT TOUCH) lancets Use as instructed for once daily blood glucose testing 08/25/15  Yes Tresa Garter, MD  metFORMIN (GLUCOPHAGE) 1000 MG tablet Take 1 tablet (1,000 mg total) by mouth 2 (two) times daily with a meal. 08/25/15  Yes Tresa Garter, MD  Multiple Vitamins-Minerals (CENTRUM WOMEN PO) Take 1 tablet by mouth daily.   Yes Historical Provider, MD  pantoprazole (PROTONIX) 40 MG tablet Take 1 tablet (40 mg total) by mouth daily. 07/06/15  Yes Kristen N Ward, DO  butalbital-acetaminophen-caffeine (FIORICET, ESGIC) 50-325-40 MG tablet Take 1 tablet by mouth 2 (two) times daily as needed for headache. Patient not taking: Reported on 01/10/2016 09/07/15   Tresa Garter, MD  calcium carbonate (TUMS - DOSED IN MG ELEMENTAL CALCIUM) 500 MG chewable tablet Chew 3 tablets by mouth 4 (four) times daily as needed for indigestion or heartburn.    Historical Provider, MD  cyclobenzaprine (FLEXERIL) 10 MG tablet Take 1 tablet (10 mg total) by mouth 3 (three) times daily as needed for muscle spasms. Patient not taking: Reported on 01/10/2016 12/14/15   Jola Schmidt, MD  mometasone-formoterol [facility]) 100-5 MCG/ACT AERO Inhale 2 puffs into the lungs 2 (two) times daily. 01/10/16   Maren Reamer, MD  predniSONE (DELTASONE) 20 MG tablet Take 2 tablets (40 mg  total) by mouth daily with breakfast. Day 1- 4 take 29m qam (= 2 tabs); than day 5-9 take 257mtabs daily. (ignore the daly orders) 01/10/16   DaMaren ReamerMD     Objective:   Vitals:   01/10/16 1431  BP: 134/84  Pulse: 90  Resp: 16  Temp: 98.3 F (36.8 C)  TempSrc: Oral  SpO2: 99%  Weight: 256 lb 3.2 oz (116.2 kg)    Exam General appearance : Awake, alert, not in any distress. Speech Clear. Not toxic looking, morbid obese, HEENT: Atraumatic and Normocephalic, pupils equally reactive to light. Neck: supple, no JVD.  Chest: diminished bs diffusely, no egophany or wheezing noted," chest tight" during inhalation and exhalation. CVS: S1 S2 regular, no murmurs/gallups or rubs. Abdomen: Bowel sounds active, obese, Non tender Extremities: B/L Lower Ext shows no edema, both legs are warm to touch Neurology: Awake alert, and oriented X 3, CN II-XII grossly intact, Non focal Skin:No Rash  Data Review Lab Results  Component Value Date   HGBA1C 10.1 08/24/2015    [facility] 10/13/2015 09/29/2015 08/24/2015  Decreased Interest 0 0 0  Down, Depressed, Hopeless 0 0 0  PHQ - 2 Score 0 0 0  Altered sleeping 2 - -  Tired, decreased energy 2 - -  Change in appetite 2 - -  Trouble concentrating 0 - -  Moving slowly or fidgety/restless 0 - -  Suicidal thoughts 0 - -  PHQ-9 Score 6 - -      Assessment & Plan   1. Asthma with acute exacerbation, moderate persistent - pt examined after duonebs, better airation, and felt much better overall. Denied chest tightness, breathing better. - ipratropium-albuterol (DUONEB) 0.5-2.5 (3) MG/3ML nebulizer solution 3 mL; Take 3 mLs by nebulization every 6 (six) hours. - prednisone taper ordered - albuterol mdi prn - f/u in 2 wks. - added morphine allergy /sob  2. Left arm pain - resolved   Patient have been counseled extensively about nutrition and exercise  Return in about 2 weeks (around 01/24/2016) for asthma  exacerbation..  The patient was given clear instructions to go to ER or return to medical center if symptoms don't improve, worsen or new problems develop. The patient verbalized understanding. The patient was told to call to get lab results if they haven't heard anything in the next week.   This note has been created with DrSurveyor, quantityAny transcriptional errors are unintentional.   DaMaren ReamerMD, MBGarlandnd [facility] 01/10/2016, 3:36 PM

## 2016-01-31 ENCOUNTER — Ambulatory Visit: Payer: Medicaid Other | Admitting: Internal Medicine

## 2016-01-31 ENCOUNTER — Telehealth: Payer: Self-pay | Admitting: Internal Medicine

## 2016-01-31 DIAGNOSIS — E1140 Type 2 diabetes mellitus with diabetic neuropathy, unspecified: Secondary | ICD-10-CM

## 2016-01-31 MED ORDER — INSULIN PEN NEEDLE 29G X 12.7MM MISC: 1.0000 | Freq: Every day | 2 refills | Status: DC

## 2016-01-31 MED ORDER — PANTOPRAZOLE SODIUM 40 MG PO TBEC: 40.0000 mg | DELAYED_RELEASE_TABLET | Freq: Every day | ORAL | 2 refills | Status: DC

## 2016-01-31 MED ORDER — INSULIN GLARGINE 100 UNIT/ML SOLOSTAR PEN
45.0000 [IU] | PEN_INJECTOR | Freq: Every day | SUBCUTANEOUS | 3 refills | Status: DC
Start: 2016-01-31 — End: 2016-02-01

## 2016-01-31 MED FILL — PANTOPRAZOLE SOD DR 40 MG T: 40 | 30 days supply | Qty: 30 | Fill #0

## 2016-01-31 NOTE — Telephone Encounter (Signed)
Requested medications/supplies were refilled - but patient is not on syringe and has pen needles for insulin so pen needle refills were sent in.

## 2016-01-31 NOTE — Telephone Encounter (Signed)
Patient is needing insulin, protonix, syringes Please follow up

## 2016-02-01 ENCOUNTER — Ambulatory Visit: Payer: Medicaid Other | Attending: Internal Medicine | Admitting: Internal Medicine

## 2016-02-01 ENCOUNTER — Encounter: Payer: Self-pay | Admitting: Internal Medicine

## 2016-02-01 VITALS — BP 130/86 | HR 98 | Temp 98.4°F | Resp 16 | Wt 255.4 lb

## 2016-02-01 DIAGNOSIS — J45909 Unspecified asthma, uncomplicated: Secondary | ICD-10-CM | POA: Diagnosis not present

## 2016-02-01 DIAGNOSIS — Z30019 Encounter for initial prescription of contraceptives, unspecified: Secondary | ICD-10-CM

## 2016-02-01 DIAGNOSIS — Z79899 Other long term (current) drug therapy: Secondary | ICD-10-CM | POA: Diagnosis not present

## 2016-02-01 DIAGNOSIS — Z308 Encounter for other contraceptive management: Secondary | ICD-10-CM

## 2016-02-01 DIAGNOSIS — Z3202 Encounter for pregnancy test, result negative: Secondary | ICD-10-CM | POA: Diagnosis not present

## 2016-02-01 DIAGNOSIS — I10 Essential (primary) hypertension: Secondary | ICD-10-CM | POA: Diagnosis not present

## 2016-02-01 DIAGNOSIS — Z888 Allergy status to other drugs, medicaments and biological substances status: Secondary | ICD-10-CM | POA: Insufficient documentation

## 2016-02-01 DIAGNOSIS — E1142 Type 2 diabetes mellitus with diabetic polyneuropathy: Secondary | ICD-10-CM | POA: Diagnosis not present

## 2016-02-01 DIAGNOSIS — E1140 Type 2 diabetes mellitus with diabetic neuropathy, unspecified: Secondary | ICD-10-CM | POA: Insufficient documentation

## 2016-02-01 DIAGNOSIS — E1149 Type 2 diabetes mellitus with other diabetic neurological complication: Secondary | ICD-10-CM | POA: Diagnosis not present

## 2016-02-01 DIAGNOSIS — Z794 Long term (current) use of insulin: Secondary | ICD-10-CM | POA: Diagnosis present

## 2016-02-01 DIAGNOSIS — K219 Gastro-esophageal reflux disease without esophagitis: Secondary | ICD-10-CM | POA: Insufficient documentation

## 2016-02-01 DIAGNOSIS — Z3200 Encounter for pregnancy test, result unknown: Secondary | ICD-10-CM | POA: Insufficient documentation

## 2016-02-01 LAB — POCT URINE PREGNANCY
PREG TEST UR: NEGATIVE
Preg Test, Ur: NEGATIVE

## 2016-02-01 LAB — GLUCOSE, POCT (MANUAL RESULT ENTRY)
POC GLUCOSE: 283 mg/dL — AB (ref 70–99)
POC GLUCOSE: 309 mg/dL — AB (ref 70–99)

## 2016-02-01 MED ORDER — ALBUTEROL SULFATE HFA 108 (90 BASE) MCG/ACT IN AERS: 2.0000 | INHALATION_SPRAY | Freq: Four times a day (QID) | RESPIRATORY_TRACT | 12 refills | Status: DC | PRN

## 2016-02-01 MED ORDER — GABAPENTIN 300 MG PO CAPS: 300.0000 mg | ORAL_CAPSULE | Freq: Three times a day (TID) | ORAL | 1 refills | Status: DC

## 2016-02-01 MED ORDER — AMLODIPINE BESYLATE 2.5 MG PO TABS: 2.5000 mg | ORAL_TABLET | Freq: Every day | ORAL | 3 refills | Status: DC

## 2016-02-01 MED ORDER — ACCU-CHEK SOFT TOUCH LANCETS MISC
12 refills | Status: DC
Start: 2016-02-01 — End: 2016-07-20

## 2016-02-01 MED ORDER — MOMETASONE FURO-FORMOTEROL FUM 100-5 MCG/ACT IN AERO: 2.0000 | INHALATION_SPRAY | Freq: Two times a day (BID) | RESPIRATORY_TRACT | 3 refills | Status: DC

## 2016-02-01 MED ORDER — SUCRALFATE 1 GM/10ML PO SUSP: 1.0000 g | Freq: Three times a day (TID) | ORAL | 0 refills | Status: DC

## 2016-02-01 MED ORDER — METFORMIN HCL ER 500 MG PO TB24: 500.0000 mg | ORAL_TABLET | Freq: Every day | ORAL | 3 refills | Status: DC

## 2016-02-01 MED ORDER — INSULIN GLARGINE 100 UNIT/ML SOLOSTAR PEN: 45.0000 [IU] | PEN_INJECTOR | Freq: Every day | SUBCUTANEOUS | 3 refills | Status: DC

## 2016-02-01 MED ORDER — PANTOPRAZOLE SODIUM 40 MG PO TBEC: 40.0000 mg | DELAYED_RELEASE_TABLET | Freq: Every day | ORAL | 2 refills | Status: DC

## 2016-02-01 MED ORDER — INSULIN PEN NEEDLE 29G X 12.7MM MISC: 1.0000 | Freq: Every day | 2 refills | Status: DC

## 2016-02-01 MED ORDER — MEDROXYPROGESTERONE ACETATE 150 MG/ML IM SUSP
150.0000 mg | Freq: Once | INTRAMUSCULAR | Status: AC
  Administered 2016-02-01: 150 mg via INTRAMUSCULAR

## 2016-02-01 MED ORDER — INSULIN ASPART 100 UNIT/ML ~~LOC~~ SOLN
10.0000 [IU] | Freq: Once | SUBCUTANEOUS | Status: AC
  Administered 2016-02-01: 10 [IU] via SUBCUTANEOUS

## 2016-02-01 MED ORDER — GLUCOSE BLOOD VI STRP: ORAL_STRIP | 12 refills | Status: DC

## 2016-02-01 MED FILL — PROAIR HFA 90 MCG INHALER: 108 (90 BAS | 27 days supply | Qty: 9 | Fill #0

## 2016-02-01 MED FILL — ACCU-CHEK SOFTCLIX LANCETS: 30 days supply | Qty: 100 | Fill #0

## 2016-02-01 MED FILL — GABAPENTIN 300 MG CAPSULE: 300 | 30 days supply | Qty: 90 | Fill #0

## 2016-02-01 MED FILL — LANTUS SOLOSTAR 100 UNITS/M: 100 | 30 days supply | Qty: 15 | Fill #0

## 2016-02-01 MED FILL — METFORMIN HCL ER 500 MG TAB: 500 | 30 days supply | Qty: 30 | Fill #0

## 2016-02-01 MED FILL — BD ULTRA FINE PEN NEEDLES: 29G X 12.7M | 30 days supply | Qty: 100 | Fill #0

## 2016-02-01 MED FILL — CARAFATE 1 GM/10 ML SUSP: 1 | 10 days supply | Qty: 420 | Fill #0

## 2016-02-01 MED FILL — AMLODIPINE BESYLATE 2.5 MG: 2.5 | 30 days supply | Qty: 30 | Fill #0

## 2016-02-01 MED FILL — ACCU-CHEK AVIVA PLUS TEST S: 30 days supply | Qty: 100 | Fill #0

## 2016-02-01 NOTE — Progress Notes (Signed)
Sarah Garrison, is a 35 y.o. female  [id number]  [id number]  DOB - [birth year]  Chief Complaint  Patient presents with  . Follow-up        Subjective:   Sarah Garrison is a 35 y.o. female here today for a follow up visit of dm, asthma and birth control. Per pt, ran out of her lantus 2 days ago, tried to pick it up in clinic yesterday but unable to. She also states that she has been off her all her other meds (antihypertensives, heart burn, metformin) for at least 2-3 wks. Per pt she was unable to pick it up 2-3 wks ago when she last saw me (01/10/16).  She states her breathing is much better since started [facility].  Using Ventolin much less, only when walking to work.  She denies wheezing/ sob other times.  Per pt, states her heartburn is much worse lately, and unable to eat much food b/c of pain.    Last menstrual period ended yesterday. Wants depo shots, has been on it prior.  Does not smoke.  Patient has No headache, No chest pain, No abdominal pain - No Nausea, No new weakness tingling or numbness, No Cough - SOB.  Problem  Encounter for Pregnancy Test    ALLERGIES: Allergies  Allergen Reactions  . Morphine And Related Shortness Of Breath  . Metformin And Related Diarrhea  . Glyburide Diarrhea    Nose bleeds, vision disruption    PAST MEDICAL HISTORY: Past Medical History:  Diagnosis Date  . Asthma   . Diabetes mellitus without complication ([place])   . GERD (gastroesophageal reflux disease)   . Hypertension     MEDICATIONS AT HOME: Prior to Admission medications   Medication Sig Start Date End Date Taking? Authorizing Provider  ACCU-CHEK SOFTCLIX LANCETS lancets USE AS INSTRUCTED 10/14/15  Yes Arnoldo Morale, MD  albuterol (PROVENTIL HFA;VENTOLIN HFA) 108 (90 Base) MCG/ACT inhaler Inhale 2 puffs into the lungs every 6 (six) hours as needed for wheezing or shortness of breath. Reported on 09/29/2015 02/01/16  Yes Maren Reamer, MD  amLODipine (NORVASC) 2.5  MG tablet Take 1 tablet (2.5 mg total) by mouth daily. Reported on 09/29/2015 02/01/16  Yes Maren Reamer, MD  b complex vitamins capsule Take 1 capsule by mouth daily.   Yes Historical Provider, MD  Blood Glucose Monitoring Suppl (ACCU-CHEK AVIVA PLUS) w/Device KIT 1 each by Does not apply route 3 (three) times daily. 09/13/15  Yes Tresa Garter, MD  calcium carbonate (TUMS - DOSED IN MG ELEMENTAL CALCIUM) 500 MG chewable tablet Chew 3 tablets by mouth 4 (four) times daily as needed for indigestion or heartburn.   Yes Historical Provider, MD  gabapentin (NEURONTIN) 300 MG capsule Take 1 capsule (300 mg total) by mouth 3 (three) times daily. 02/01/16  Yes Maren Reamer, MD  glucose blood test strip Use as instructed 02/01/16  Yes Maren Reamer, MD  ibuprofen (ADVIL,MOTRIN) 600 MG tablet Take 1 tablet (600 mg total) by mouth every 8 (eight) hours as needed. 12/14/15  Yes Jola Schmidt, MD  Insulin Glargine (LANTUS SOLOSTAR) 100 UNIT/ML Solostar Pen Inject 45 Units into the skin daily at 10 pm. Discontinue previous dose 02/01/16  Yes Maren Reamer, MD  Insulin Pen Needle (ULTICARE PEN NEEDLES) 29G X 12.7MM MISC 1 each by Does not apply route at bedtime. 02/01/16  Yes Maren Reamer, MD  Lancets (ACCU-CHEK SOFT TOUCH) lancets Use as instructed for once daily blood glucose testing 02/01/16  Yes Maren Reamer, MD  mometasone-formoterol (DULERA) 100-5 MCG/ACT AERO Inhale 2 puffs into the lungs 2 (two) times daily. 02/01/16  Yes Maren Reamer, MD  Multiple Vitamins-Minerals (CENTRUM WOMEN PO) Take 1 tablet by mouth daily.   Yes Historical Provider, MD  pantoprazole (PROTONIX) 40 MG tablet Take 1 tablet (40 mg total) by mouth daily. 02/01/16  Yes Nami Strawder Lazarus Gowda, MD  butalbital-acetaminophen-caffeine (FIORICET, ESGIC) 50-325-40 MG tablet Take 1 tablet by mouth 2 (two) times daily as needed for headache. Patient not taking: Reported on 01/10/2016 09/07/15   Tresa Garter, MD  cyclobenzaprine  (FLEXERIL) 10 MG tablet Take 1 tablet (10 mg total) by mouth 3 (three) times daily as needed for muscle spasms. Patient not taking: Reported on 01/10/2016 12/14/15   Jola Schmidt, MD  metFORMIN (GLUCOPHAGE XR) 500 MG 24 hr tablet Take 1 tablet (500 mg total) by mouth daily with breakfast. 02/01/16   Maren Reamer, MD  sucralfate (CARAFATE) 1 GM/10ML suspension Take 10 mLs (1 g total) by mouth 4 (four) times daily -  with meals and at bedtime. 02/01/16   Maren Reamer, MD     Objective:   Vitals:   02/01/16 0936  BP: 130/86  Pulse: 98  Resp: 16  Temp: 98.4 F (36.9 C)  TempSrc: Oral  SpO2: 97%  Weight: 255 lb 6.4 oz (115.8 kg)    Exam General appearance : Awake, alert, not in any distress. Speech Clear. Not toxic looking, morbid obese HEENT: Atraumatic and Normocephalic,  Neck: supple, no JVD. No cervical lymphadenopathy.  Chest:Good air entry bilaterally, no added sounds. No wheezing., good airation. CVS: S1 S2 regular, no murmurs/gallups or rubs. Abdomen: Bowel sounds active, Non tender and not distended with no gaurding, rigidity or rebound. Extremities: B/L Lower Ext shows no edema, both legs are warm to touch Neurology: Awake alert, and oriented X 3, CN II-XII grossly intact, Non focal Skin:No Rash  Data Review Lab Results  Component Value Date   HGBA1C 9.0 01/10/2016   HGBA1C 10.1 08/24/2015    Depression screen [facility] 2/9 02/01/2016 01/10/2016 10/13/2015 09/29/2015 08/24/2015  Decreased Interest 1 2 0 0 0  Down, Depressed, Hopeless 0 0 0 0 0  PHQ - 2 Score 1 2 0 0 0  Altered sleeping - 1 2 - -  Tired, decreased energy - 2 2 - -  Change in appetite - 0 2 - -  Feeling bad or failure about yourself  - 0 - - -  Trouble concentrating - 0 0 - -  Moving slowly or fidgety/restless - 0 0 - -  Suicidal thoughts - 0 0 - -  PHQ-9 Score - 5 6 - -      Assessment & Plan   1. Type 2 diabetes mellitus with diabetic neuropathy, unspecified long term insulin use status  (HCC) uncontrolled - Glucose (CBG) 309 --> 283 (after 10units insulin) - Flu Vaccine QUAD 36+ mos PF IM (Fluarix & Fluzone Quad PF) - POCT urine pregnancy - Insulin Glargine (LANTUS SOLOSTAR) 100 UNIT/ML Solostar Pen; Inject 45 Units into the skin daily at 10 pm. Discontinue previous dose  Dispense: 15 mL; Refill: 3 - POCT glucose (manual entry)  2. Encounter for pregnancy test - POCT urine pregnancy  neg - medroxyPROGESTERone (DEPO-PROVERA) injection 150 mg; Inject 1 mL (150 mg total) into the muscle once.  3. Morbid obesity, unspecified obesity type ([place]) Recd increase exercise, reduce calories, food diary.  4. Encounter for female birth control Depo  shot today, q90month  5. Gastroesophageal reflux disease without esophagitis Severe, has been off ppi x 3wks or so, taking prn tums.  +carafate qid - renewed ppi - gerd diet given.     Patient have been counseled extensively about nutrition and exercise  Return in about 3 months (around 05/03/2016) for depo shot/dm chk.  The patient was given clear instructions to go to ER or return to medical center if symptoms don't improve, worsen or new problems develop. The patient verbalized understanding. The patient was told to call to get lab results if they haven't heard anything in the next week.   This note has been created with DSurveyor, quantity Any transcriptional errors are unintentional.   DMaren Reamer MD, MBieberand WIntegris Community Hospital - Council CrossingGNew [place]  02/01/2016, 10:31 AM

## 2016-02-01 NOTE — Patient Instructions (Addendum)
3 month visit   Diabetes teaching   Aim for 30 minutes of exercise most days. Rethink what you drink. Water is great! Aim for 2-3 Carb Choices per meal (30-45 grams) +/- 1 either way.  Aim for 0-15 Carbs per snack if hungry.  Include protein in moderation with your meals and snacks.  Consider reading food labels for Total Carbohydrate and Fat Grams of foods  Consider checking blood glucose (accuchecks/BG) at alternate times per day.  Continue taking medication as directed. Be mindful about how much sugar you are adding to beverages and other foods. Fruit Punch - find one with no sugar  Measure and decrease portions of carbohydrate foods. Make your plate and don't go back for seconds.     Food Choices for Gastroesophageal Reflux Disease, Adult When you have gastroesophageal reflux disease (GERD), the foods you eat and your eating habits are very important. Choosing the right foods can help ease your discomfort.  WHAT GUIDELINES DO I NEED TO FOLLOW?   Choose fruits, vegetables, whole grains, and low-fat dairy products.   Choose low-fat meat, fish, and poultry.  Limit fats such as oils, salad dressings, butter, nuts, and avocado.   Keep a food diary. This helps you identify foods that cause symptoms.   Avoid foods that cause symptoms. These may be different for everyone.   Eat small meals often instead of 3 large meals a day.   Eat your meals slowly, in a place where you are relaxed.   Limit fried foods.   Cook foods using methods other than frying.   Avoid drinking alcohol.   Avoid drinking large amounts of liquids with your meals.   Avoid bending over or lying down until 2-3 hours after eating.  WHAT FOODS ARE NOT RECOMMENDED?  These are some foods and drinks that may make your symptoms worse: Vegetables Tomatoes. Tomato juice. Tomato and spaghetti sauce. Chili peppers. Onion and garlic. Horseradish. Fruits Oranges, grapefruit, and lemon (fruit and  juice). Meats High-fat meats, fish, and poultry. This includes hot dogs, ribs, ham, sausage, salami, and bacon. Dairy Whole milk and chocolate milk. Sour cream. Cream. Butter. Ice cream. Cream cheese.  Drinks Coffee and tea. Bubbly (carbonated) drinks or energy drinks. Condiments Hot sauce. Barbecue sauce.  Sweets/Desserts Chocolate and cocoa. Donuts. Peppermint and spearmint. Fats and Oils High-fat foods. This includes [place] fries and potato chips. Other Vinegar. Strong spices. This includes black pepper, white pepper, red pepper, cayenne, curry powder, cloves, ginger, and chili powder. The items listed above may not be a complete list of foods and drinks to avoid. Contact your dietitian for more information.   This information is not intended to replace advice given to you by your health care provider. Make sure you discuss any questions you have with your health care provider.   Document Released: 11/20/2011 Document Revised: 06/11/2014 Document Reviewed: 03/25/2013 Elsevier Interactive Patient Education 2016 [employer].  Medroxyprogesterone injection [Contraceptive] What is this medicine? MEDROXYPROGESTERONE (me DROX ee proe JES te rone) contraceptive injections prevent pregnancy. They provide effective birth control for 3 months. Depo-subQ Provera 104 is also used for treating pain related to endometriosis. This medicine may be used for other purposes; ask your health care provider or pharmacist if you have questions. What should I tell my health care provider before I take this medicine? They need to know if you have any of these conditions: -frequently drink alcohol -asthma -blood vessel disease or a history of a blood clot in the lungs or legs -  bone disease such as osteoporosis -breast cancer -diabetes -eating disorder (anorexia nervosa or bulimia) -high blood pressure -HIV infection or AIDS -kidney disease -liver disease -mental depression -migraine -seizures  (convulsions) -stroke -tobacco smoker -vaginal bleeding -an unusual or allergic reaction to medroxyprogesterone, other hormones, medicines, foods, dyes, or preservatives -pregnant or trying to get pregnant -breast-feeding How should I use this medicine? Depo-Provera Contraceptive injection is given into a muscle. Depo-subQ Provera 104 injection is given under the skin. These injections are given by a health care professional. You must not be pregnant before getting an injection. The injection is usually given during the first 5 days after the start of a menstrual period or 6 weeks after delivery of a baby. Talk to your pediatrician regarding the use of this medicine in children. Special care may be needed. These injections have been used in female children who have started having menstrual periods. Overdosage: If you think you have taken too much of this medicine contact a poison control center or emergency room at once. NOTE: This medicine is only for you. Do not share this medicine with others. What if I miss a dose? Try not to miss a dose. You must get an injection once every 3 months to maintain birth control. If you cannot keep an appointment, call and reschedule it. If you wait longer than 13 weeks between Depo-Provera contraceptive injections or longer than 14 weeks between Depo-subQ Provera 104 injections, you could get pregnant. Use another method for birth control if you miss your appointment. You may also need a pregnancy test before receiving another injection. What may interact with this medicine? Do not take this medicine with any of the following medications: -bosentan This medicine may also interact with the following medications: -aminoglutethimide -antibiotics or medicines for infections, especially rifampin, rifabutin, rifapentine, and griseofulvin -aprepitant -barbiturate medicines such as phenobarbital or primidone -bexarotene -carbamazepine -medicines for seizures like  ethotoin, felbamate, oxcarbazepine, phenytoin, topiramate -modafinil -St. John's wort This list may not describe all possible interactions. Give your health care provider a list of all the medicines, herbs, non-prescription drugs, or dietary supplements you use. Also tell them if you smoke, drink alcohol, or use illegal drugs. Some items may interact with your medicine. What should I watch for while using this medicine? This drug does not protect you against HIV infection (AIDS) or other sexually transmitted diseases. Use of this product may cause you to lose calcium from your bones. Loss of calcium may cause weak bones (osteoporosis). Only use this product for more than 2 years if other forms of birth control are not right for you. The longer you use this product for birth control the more likely you will be at risk for weak bones. Ask your health care professional how you can keep strong bones. You may have a change in bleeding pattern or irregular periods. Many females stop having periods while taking this drug. If you have received your injections on time, your chance of being pregnant is very low. If you think you may be pregnant, see your health care professional as soon as possible. Tell your health care professional if you want to get pregnant within the next year. The effect of this medicine may last a long time after you get your last injection. What side effects may I notice from receiving this medicine? Side effects that you should report to your doctor or health care professional as soon as possible: -allergic reactions like skin rash, itching or hives, swelling of the face, lips,  or tongue -breast tenderness or discharge -breathing problems -changes in vision -depression -feeling faint or lightheaded, falls -fever -pain in the abdomen, chest, groin, or leg -problems with balance, talking, walking -unusually weak or tired -yellowing of the eyes or skin Side effects that usually do  not require medical attention (report to your doctor or health care professional if they continue or are bothersome): -acne -fluid retention and swelling -headache -irregular periods, spotting, or absent periods -temporary pain, itching, or skin reaction at site where injected -weight gain This list may not describe all possible side effects. Call your doctor for medical advice about side effects. You may report side effects to FDA at 1-800-FDA-1088. Where should I keep my medicine? This does not apply. The injection will be given to you by a health care professional. NOTE: This sheet is a summary. It may not cover all possible information. If you have questions about this medicine, talk to your doctor, pharmacist, or health care provider.    2016, Elsevier/Gold Standard. (2008-06-11 18:37:56)   Influenza Virus Vaccine injection (Fluarix) What is this medicine? INFLUENZA VIRUS VACCINE (in floo EN zuh VAHY ruhs vak SEEN) helps to reduce the risk of getting influenza also known as the flu. This medicine may be used for other purposes; ask your health care provider or pharmacist if you have questions. What should I tell my health care provider before I take this medicine? They need to know if you have any of these conditions: -bleeding disorder like hemophilia -fever or infection -Guillain-Barre syndrome or other neurological problems -immune system problems -infection with the human immunodeficiency virus (HIV) or AIDS -low blood platelet counts -multiple sclerosis -an unusual or allergic reaction to influenza virus vaccine, eggs, chicken proteins, latex, gentamicin, other medicines, foods, dyes or preservatives -pregnant or trying to get pregnant -breast-feeding How should I use this medicine? This vaccine is for injection into a muscle. It is given by a health care professional. A copy of Vaccine Information Statements will be given before each vaccination. Read this sheet carefully  each time. The sheet may change frequently. Talk to your pediatrician regarding the use of this medicine in children. Special care may be needed. Overdosage: If you think you have taken too much of this medicine contact a poison control center or emergency room at once. NOTE: This medicine is only for you. Do not share this medicine with others. What if I miss a dose? This does not apply. What may interact with this medicine? -chemotherapy or radiation therapy -medicines that lower your immune system like etanercept, anakinra, infliximab, and adalimumab -medicines that treat or prevent blood clots like warfarin -phenytoin -steroid medicines like prednisone or cortisone -theophylline -vaccines This list may not describe all possible interactions. Give your health care provider a list of all the medicines, herbs, non-prescription drugs, or dietary supplements you use. Also tell them if you smoke, drink alcohol, or use illegal drugs. Some items may interact with your medicine. What should I watch for while using this medicine? Report any side effects that do not go away within 3 days to your doctor or health care professional. Call your health care provider if any unusual symptoms occur within 6 weeks of receiving this vaccine. You may still catch the flu, but the illness is not usually as bad. You cannot get the flu from the vaccine. The vaccine will not protect against colds or other illnesses that may cause fever. The vaccine is needed every year. What side effects may I notice from  receiving this medicine? Side effects that you should report to your doctor or health care professional as soon as possible: -allergic reactions like skin rash, itching or hives, swelling of the face, lips, or tongue Side effects that usually do not require medical attention (report to your doctor or health care professional if they continue or are bothersome): -fever -headache -muscle aches and pains -pain,  tenderness, redness, or swelling at site where injected -weak or tired This list may not describe all possible side effects. Call your doctor for medical advice about side effects. You may report side effects to FDA at 1-800-FDA-1088. Where should I keep my medicine? This vaccine is only given in a clinic, pharmacy, doctor's office, or other health care setting and will not be stored at home. NOTE: This sheet is a summary. It may not cover all possible information. If you have questions about this medicine, talk to your doctor, pharmacist, or health care provider.    2016, Elsevier/Gold Standard. (2007-12-17 09:30:40)

## 2016-02-01 NOTE — Progress Notes (Signed)
Pt is in the office today to talk about birth control Pt states she is not in any pain

## 2016-02-08 ENCOUNTER — Emergency Department (HOSPITAL_COMMUNITY)
Admission: EM | Admit: 2016-02-08 | Discharge: 2016-02-09 | Disposition: A | Discharge status: Home / Self Care | Payer: Medicaid Other | Attending: Emergency Medicine | Admitting: Emergency Medicine

## 2016-02-08 ENCOUNTER — Encounter (HOSPITAL_COMMUNITY): Payer: Self-pay

## 2016-02-08 DIAGNOSIS — J45909 Unspecified asthma, uncomplicated: Secondary | ICD-10-CM | POA: Diagnosis not present

## 2016-02-08 DIAGNOSIS — E119 Type 2 diabetes mellitus without complications: Secondary | ICD-10-CM | POA: Insufficient documentation

## 2016-02-08 DIAGNOSIS — Z794 Long term (current) use of insulin: Secondary | ICD-10-CM | POA: Insufficient documentation

## 2016-02-08 DIAGNOSIS — Z87891 Personal history of nicotine dependence: Secondary | ICD-10-CM | POA: Diagnosis not present

## 2016-02-08 DIAGNOSIS — Z79899 Other long term (current) drug therapy: Secondary | ICD-10-CM | POA: Insufficient documentation

## 2016-02-08 DIAGNOSIS — Z7984 Long term (current) use of oral hypoglycemic drugs: Secondary | ICD-10-CM | POA: Diagnosis not present

## 2016-02-08 DIAGNOSIS — I10 Essential (primary) hypertension: Secondary | ICD-10-CM | POA: Diagnosis not present

## 2016-02-08 DIAGNOSIS — R52 Pain, unspecified: Secondary | ICD-10-CM | POA: Diagnosis present

## 2016-02-08 DIAGNOSIS — B349 Viral infection, unspecified: Secondary | ICD-10-CM

## 2016-02-08 LAB — CBG MONITORING, ED
Glucose-Capillary: 272 mg/dL — ABNORMAL HIGH (ref 65–99)
Glucose-Capillary: 393 mg/dL — ABNORMAL HIGH (ref 65–99)

## 2016-02-08 LAB — I-STAT CHEM 8, ED
BUN: 12 mg/dL (ref 6–20)
CHLORIDE: 103 mmol/L (ref 101–111)
CREATININE: 0.5 mg/dL (ref 0.44–1.00)
Calcium, Ion: 1.23 mmol/L (ref 1.15–1.40)
GLUCOSE: 376 mg/dL — AB (ref 65–99)
HCT: 41 % (ref 36.0–46.0)
Hemoglobin: 13.9 g/dL (ref 12.0–15.0)
POTASSIUM: 4.1 mmol/L (ref 3.5–5.1)
Sodium: 138 mmol/L (ref 135–145)
TCO2: 22 mmol/L (ref 0–100)

## 2016-02-08 MED ORDER — ACETAMINOPHEN 500 MG PO TABS
1000.0000 mg | ORAL_TABLET | Freq: Once | ORAL | Status: AC
  Administered 2016-02-09: 1000 mg via ORAL
  Filled 2016-02-08: qty 2

## 2016-02-08 MED ORDER — INSULIN ASPART 100 UNIT/ML ~~LOC~~ SOLN
3.0000 [IU] | Freq: Once | SUBCUTANEOUS | Status: AC
  Administered 2016-02-08: 3 [IU] via SUBCUTANEOUS
  Filled 2016-02-08: qty 1

## 2016-02-08 MED ORDER — KETOROLAC TROMETHAMINE 60 MG/2ML IM SOLN
60.0000 mg | Freq: Once | INTRAMUSCULAR | Status: DC
  Filled 2016-02-08: qty 2

## 2016-02-08 MED ORDER — NAPROXEN 375 MG PO TABS
375.0000 mg | ORAL_TABLET | Freq: Once | ORAL | Status: AC
  Administered 2016-02-09: 375 mg via ORAL
  Filled 2016-02-08: qty 1

## 2016-02-08 NOTE — ED Provider Notes (Signed)
[facility]-EMERGENCY DEPT Provider Note   CSN: [id number] Arrival date & time: 02/08/16  2043  By signing my name below, I, Peyton Lee, attest that this documentation has been prepared under the direction and in the presence of  , MD . Electronically Signed: Peyton Lee, Scribe. 02/08/2016. 11:10 PM.  History   Chief Complaint Chief Complaint  Patient presents with  . Generalized Body Aches   The history is provided by the patient. No language interpreter was used.  Muscle Pain  This is a new problem. The current episode started more than 2 days ago. The problem occurs constantly. The problem has not changed since onset.Pertinent negatives include no chest pain, no abdominal pain, no headaches and no shortness of breath. Nothing aggravates the symptoms. Nothing relieves the symptoms. The treatment provided no relief.   HPI Comments: Jaleigha M Holten is a 35 y.o. female with PMHx of asthma, DM and HTN, who presents to the Emergency Department complaining of gradually worsening, generalized body aches that began 1 week ago and worsened today. She states her pain feels as though "all of her bones are breaking." Pt notes she received the flu shot and insulin shot at the [facility] 1 week ago and her symptoms began soon after. She reports associated ear pain, sore throat and decreased appetite. She states she has taken ibuprofen with no relief. Pt notes her blood sugar was 509 upon arrival in the ED today but states this has decreased now. She denies congestion, rhinorrhea, eye drainage, dysuria and urinary frequency.   Past Medical History:  Diagnosis Date  . Asthma   . Diabetes mellitus without complication (HCC)   . GERD (gastroesophageal reflux disease)   . Hypertension    Patient Active Problem List   Diagnosis Date Noted  . Encounter for pregnancy test 02/01/2016  . Leg swelling 09/29/2015  . Morbid obesity (HCC) 09/29/2015  . DM neuropathy, type II  diabetes mellitus (HCC) 08/24/2015    Past Surgical History:  Procedure Laterality Date  . CESAREAN SECTION    . TUBAL LIGATION    . WISDOM TOOTH EXTRACTION      OB History    Gravida Para Term Preterm AB Living   5 4     1 4   SAB TAB Ectopic Multiple Live Births   1               Home Medications    Prior to Admission medications   Medication Sig Start Date End Date Taking? Authorizing Provider  ACCU-CHEK SOFTCLIX LANCETS lancets USE AS INSTRUCTED 10/14/15   Enobong Amao, MD  albuterol (PROVENTIL HFA;VENTOLIN HFA) 108 (90 Base) MCG/ACT inhaler Inhale 2 puffs into the lungs every 6 (six) hours as needed for wheezing or shortness of breath. Reported on 09/29/2015 02/01/16   Dawn T Langeland, MD  amLODipine (NORVASC) 2.5 MG tablet Take 1 tablet (2.5 mg total) by mouth daily. Reported on 09/29/2015 02/01/16   Dawn T Langeland, MD  b complex vitamins capsule Take 1 capsule by mouth daily.    Historical Provider, MD  Blood Glucose Monitoring Suppl (ACCU-CHEK AVIVA PLUS) w/Device KIT 1 each by Does not apply route 3 (three) times daily. 09/13/15   Olugbemiga E Jegede, MD  butalbital-acetaminophen-caffeine (FIORICET, ESGIC) 50-325-40 MG tablet Take 1 tablet by mouth 2 (two) times daily as needed for headache. Patient not taking: Reported on 01/10/2016 09/07/15   Olugbemiga E Jegede, MD  calcium carbonate (TUMS - DOSED IN MG ELEMENTAL CALCIUM)   500 MG chewable tablet Chew 3 tablets by mouth 4 (four) times daily as needed for indigestion or heartburn.    Historical Provider, MD  cyclobenzaprine (FLEXERIL) 10 MG tablet Take 1 tablet (10 mg total) by mouth 3 (three) times daily as needed for muscle spasms. Patient not taking: Reported on 01/10/2016 12/14/15   Jola Schmidt, MD  gabapentin (NEURONTIN) 300 MG capsule Take 1 capsule (300 mg total) by mouth 3 (three) times daily. 02/01/16   Maren Reamer, MD  glucose blood test strip Use as instructed 02/01/16   Maren Reamer, MD  ibuprofen (ADVIL,MOTRIN)  600 MG tablet Take 1 tablet (600 mg total) by mouth every 8 (eight) hours as needed. 12/14/15   Jola Schmidt, MD  Insulin Glargine (LANTUS SOLOSTAR) 100 UNIT/ML Solostar Pen Inject 45 Units into the skin daily at 10 pm. Discontinue previous dose 02/01/16   Maren Reamer, MD  Insulin Pen Needle (ULTICARE PEN NEEDLES) 29G X 12.7MM MISC 1 each by Does not apply route at bedtime. 02/01/16   Maren Reamer, MD  Lancets (ACCU-CHEK SOFT TOUCH) lancets Use as instructed for once daily blood glucose testing 02/01/16   Maren Reamer, MD  metFORMIN (GLUCOPHAGE XR) 500 MG 24 hr tablet Take 1 tablet (500 mg total) by mouth daily with breakfast. 02/01/16   Maren Reamer, MD  mometasone-formoterol (DULERA) 100-5 MCG/ACT AERO Inhale 2 puffs into the lungs 2 (two) times daily. 02/01/16   Maren Reamer, MD  Multiple Vitamins-Minerals (CENTRUM WOMEN PO) Take 1 tablet by mouth daily.    Historical Provider, MD  pantoprazole (PROTONIX) 40 MG tablet Take 1 tablet (40 mg total) by mouth daily. 02/01/16   Maren Reamer, MD  sucralfate (CARAFATE) 1 GM/10ML suspension Take 10 mLs (1 g total) by mouth 4 (four) times daily -  with meals and at bedtime. 02/01/16   Maren Reamer, MD    Family History Family History  Problem Relation Age of Onset  . Asthma Mother   . Asthma Father     Social History Social History  Substance Use Topics  . Smoking status: Former Smoker    Packs/day: 0.00    Types: Cigarettes    Quit date: 11/02/2013  . Smokeless tobacco: Never Used  . Alcohol use No     Allergies   Morphine and related; Metformin and related; and Glyburide   Review of Systems Review of Systems  Constitutional: Positive for appetite change. Negative for chills and fever.  HENT: Positive for ear pain and sore throat. Negative for congestion, ear discharge and rhinorrhea.   Eyes: Negative for photophobia.  Respiratory: Negative for chest tightness and shortness of breath.   Cardiovascular: Negative  for chest pain, palpitations and leg swelling.  Gastrointestinal: Negative for abdominal pain.  Genitourinary: Negative for dysuria and frequency.  Musculoskeletal: Positive for myalgias. Negative for gait problem, joint swelling, neck pain and neck stiffness.  Skin: Negative for rash.  Neurological: Negative for headaches.  All other systems reviewed and are negative.  Physical Exam Updated Vital Signs BP 147/95 (BP Location: Right Arm)   Pulse 101   Temp 98.2 F (36.8 C) (Oral)   Resp 16   Ht 5' 5" (1.651 m)   Wt 232 lb (105.2 kg)   LMP 01/31/2016   SpO2 100%   BMI 38.61 kg/m   Physical Exam  Constitutional: She appears well-developed and well-nourished.  Well appearing resting comfortably in bed  HENT:  Head: Normocephalic.  Mouth/Throat: Oropharynx  is clear and moist. No oropharyngeal exudate.  Eyes: Conjunctivae and EOM are normal. Pupils are equal, round, and reactive to light. Right eye exhibits no discharge. Left eye exhibits no discharge. No scleral icterus.  Neck: Normal range of motion. Neck supple. No JVD present. No neck rigidity. No tracheal deviation present. No Brudzinski's sign and no Kernig's sign noted.  Trachea is midline. No stridor or carotid bruits.  No supraclavicular or cervical tenderness  Cardiovascular: Normal rate, regular rhythm, normal heart sounds and intact distal pulses.   No murmur heard. Pulmonary/Chest: Effort normal and breath sounds normal. No stridor. No respiratory distress. She has no wheezes. She has no rales. She exhibits no tenderness.  Lungs CTA bilaterally.  Abdominal: Soft. Bowel sounds are normal. She exhibits no distension. There is no tenderness. There is no rebound and no guarding.  Musculoskeletal: Normal range of motion. She exhibits no edema or tenderness.  All compartments soft   Lymphadenopathy:    She has no cervical adenopathy.  Neurological: She is alert. She has normal reflexes. She displays normal reflexes. She  exhibits normal muscle tone.  Skin: Skin is warm and dry. Capillary refill takes less than 2 seconds.  No splinter hemorrhages no osler nodes nor Janeway lesions.  All joints and muscles are non tender  Psychiatric: She has a normal mood and affect. Her behavior is normal.  Nursing note and vitals reviewed.  ED Treatments / Results  Labs (all labs ordered are listed, but only abnormal results are displayed) Labs Reviewed  CBG MONITORING, ED - Abnormal; Notable for the following:       Result Value   Glucose-Capillary 272 (*)    All other components within normal limits    EKG  EKG Interpretation  Date/Time:  Wednesday February 08 2016 20:50:06 EDT Ventricular Rate:  99 PR Interval:    QRS Duration: 82 QT Interval:  341 QTC Calculation: 438 R Axis:   87 Text Interpretation:  Sinus rhythm Low voltage, precordial leads Confirmed by [facility]  MD, Janyth Riera ([id number]) on 02/08/2016 11:01:00 PM       Radiology No results found.  Procedures Procedures  DIAGNOSTIC STUDIES:  Oxygen Saturation is 100% on RA, normal by my interpretation.    COORDINATION OF CARE:  11:08 PM Discussed treatment plan with pt at bedside and pt agreed to plan.  Medications Ordered in ED Medications - No data to display   Initial Impression / Assessment and Plan / ED Course  I have reviewed the triage vital signs and the nursing notes.  Pertinent labs & imaging results that were available during my care of the patient were reviewed by me and considered in my medical decision making (see chart for details).  Clinical Course   Vitals:   02/08/16 2050 02/09/16 0129  BP: 147/95 114/76  Pulse: 101 77  Resp: 16 21  Temp: 98.2 F (36.8 C)    Results for orders placed or performed during the hospital encounter of 02/08/16  Rapid strep screen  Result Value Ref Range   Streptococcus, Group A Screen (Direct) NEGATIVE NEGATIVE  Urinalysis, Routine w reflex microscopic (not at [facility])  Result Value  Ref Range   Color, Urine YELLOW YELLOW   APPearance CLEAR CLEAR   Specific Gravity, Urine 1.045 (H) 1.005 - 1.030   pH 5.0 5.0 - 8.0   Glucose, UA >1000 (A) NEGATIVE mg/dL   Hgb urine dipstick NEGATIVE NEGATIVE   Bilirubin Urine NEGATIVE NEGATIVE   Ketones, ur NEGATIVE NEGATIVE mg/dL  Protein, ur NEGATIVE NEGATIVE mg/dL   Nitrite NEGATIVE NEGATIVE   Leukocytes, UA NEGATIVE NEGATIVE  Urine microscopic-add on  Result Value Ref Range   Squamous Epithelial / LPF 0-5 (A) NONE SEEN   WBC, UA 0-5 0 - 5 WBC/hpf   RBC / HPF NONE SEEN 0 - 5 RBC/hpf   Bacteria, UA RARE (A) NONE SEEN  POC CBG, ED  Result Value Ref Range   Glucose-Capillary 272 (H) 65 - 99 mg/dL  CBG monitoring, ED  Result Value Ref Range   Glucose-Capillary 393 (H) 65 - 99 mg/dL   Comment 1 Notify RN    Comment 2 Document in Chart   I-stat chem 8, ed  Result Value Ref Range   Sodium 138 135 - 145 mmol/L   Potassium 4.1 3.5 - 5.1 mmol/L   Chloride 103 101 - 111 mmol/L   BUN 12 6 - 20 mg/dL   Creatinine, Ser 0.50 0.44 - 1.00 mg/dL   Glucose, Bld 376 (H) 65 - 99 mg/dL   Calcium, Ion 1.23 1.15 - 1.40 mmol/L   TCO2 22 0 - 100 mmol/L   Hemoglobin 13.9 12.0 - 15.0 g/dL   HCT 41.0 36.0 - 46.0 %  CBG monitoring, ED  Result Value Ref Range   Glucose-Capillary 317 (H) 65 - 99 mg/dL   Comment 1 Notify RN    Comment 2 Document in Chart    No results found. Medications  ketorolac (TORADOL) injection 60 mg (60 mg Intramuscular Refused 02/08/16 2340)  insulin aspart (novoLOG) injection 3 Units (3 Units Subcutaneous Given 02/08/16 2339)  acetaminophen (TYLENOL) tablet 1,000 mg (1,000 mg Oral Given 02/09/16 0007)  naproxen (NAPROSYN) tablet 375 mg (375 mg Oral Given 02/09/16 0007)  Refused toradol  Well appearing tolerating PO.  I suspect this is a post vaccination reaction vs. A viral syndrome.  Strict return precautions given for fever cough, vomiting weakness or any concerns.  All questions answered to patient's satisfaction. Based  on history and exam patient has been appropriately medically screened and emergency conditions excluded. Patient is stable for discharge at this time. Follow up with your PMD for recheck in 2 days and strict return precautions given.  NSAIDs and heat to the affected area  I personally performed the services described in this documentation, which was scribed in my presence. The recorded information has been reviewed and is accurate.   Final Clinical Impressions(s) / ED Diagnoses   Final diagnoses:  None    New Prescriptions New Prescriptions   No medications on file      , MD 02/09/16 0233  

## 2016-02-08 NOTE — ED Triage Notes (Signed)
Pt states that she had the flu shot last Wednesday and today she started feeling short of breath and having generalized body aches, pt also states that she's a diabetic and her blood sugar was over 500 today

## 2016-02-09 ENCOUNTER — Encounter (HOSPITAL_COMMUNITY): Payer: Self-pay | Admitting: Emergency Medicine

## 2016-02-09 LAB — URINALYSIS, ROUTINE W REFLEX MICROSCOPIC
Bilirubin Urine: NEGATIVE
Hgb urine dipstick: NEGATIVE
Ketones, ur: NEGATIVE mg/dL
LEUKOCYTES UA: NEGATIVE
Nitrite: NEGATIVE
PH: 5 (ref 5.0–8.0)
Protein, ur: NEGATIVE mg/dL
Specific Gravity, Urine: 1.045 — ABNORMAL HIGH (ref 1.005–1.030)

## 2016-02-09 LAB — RAPID STREP SCREEN (MED CTR MEBANE ONLY): STREPTOCOCCUS, GROUP A SCREEN (DIRECT): NEGATIVE

## 2016-02-09 LAB — URINE MICROSCOPIC-ADD ON: RBC / HPF: NONE SEEN RBC/hpf (ref 0–5)

## 2016-02-09 LAB — CBG MONITORING, ED: Glucose-Capillary: 317 mg/dL — ABNORMAL HIGH (ref 65–99)

## 2016-02-09 MED ORDER — NAPROXEN 500 MG PO TABS: 500.0000 mg | ORAL_TABLET | Freq: Two times a day (BID) | ORAL | 0 refills | Status: DC

## 2016-02-09 NOTE — ED Notes (Signed)
Pt complains of 10/10 generalized body aches. Pt stated feeling febrile. Temperature 98.0. Started the morning of 9/6.

## 2016-02-11 LAB — CULTURE, GROUP A STREP (THRC)

## 2016-02-23 ENCOUNTER — Emergency Department (HOSPITAL_COMMUNITY)
Admission: EM | Admit: 2016-02-23 | Discharge: 2016-02-24 | Disposition: A | Discharge status: Home / Self Care | Payer: Medicaid Other | Attending: Emergency Medicine | Admitting: Emergency Medicine

## 2016-02-23 ENCOUNTER — Encounter (HOSPITAL_COMMUNITY): Payer: Self-pay

## 2016-02-23 DIAGNOSIS — L03213 Periorbital cellulitis: Secondary | ICD-10-CM

## 2016-02-23 DIAGNOSIS — H578 Other specified disorders of eye and adnexa: Secondary | ICD-10-CM | POA: Diagnosis present

## 2016-02-23 DIAGNOSIS — Z87891 Personal history of nicotine dependence: Secondary | ICD-10-CM | POA: Insufficient documentation

## 2016-02-23 DIAGNOSIS — Z7984 Long term (current) use of oral hypoglycemic drugs: Secondary | ICD-10-CM | POA: Diagnosis not present

## 2016-02-23 DIAGNOSIS — J45909 Unspecified asthma, uncomplicated: Secondary | ICD-10-CM | POA: Diagnosis not present

## 2016-02-23 DIAGNOSIS — Z79899 Other long term (current) drug therapy: Secondary | ICD-10-CM | POA: Diagnosis not present

## 2016-02-23 DIAGNOSIS — I10 Essential (primary) hypertension: Secondary | ICD-10-CM | POA: Insufficient documentation

## 2016-02-23 DIAGNOSIS — E119 Type 2 diabetes mellitus without complications: Secondary | ICD-10-CM | POA: Insufficient documentation

## 2016-02-23 DIAGNOSIS — Z794 Long term (current) use of insulin: Secondary | ICD-10-CM | POA: Diagnosis not present

## 2016-02-23 DIAGNOSIS — Z791 Long term (current) use of non-steroidal anti-inflammatories (NSAID): Secondary | ICD-10-CM | POA: Diagnosis not present

## 2016-02-23 LAB — CBG MONITORING, ED: GLUCOSE-CAPILLARY: 317 mg/dL — AB (ref 65–99)

## 2016-02-23 NOTE — ED Provider Notes (Signed)
[facility] DEPT Provider Note   CSN: [id number] Arrival date & time: 02/23/16  2056  By signing my name below, I, Gwenlyn Fudge, attest that this documentation has been prepared under the direction and in the presence of Junius Creamer, NP. Electronically Signed: Gwenlyn Fudge, ED Scribe. 02/23/16. 12:03 AM.  History   Chief Complaint Chief Complaint  Patient presents with  . Conjunctivitis    BILATERAL   The history is provided by the patient. No language interpreter was used.   HPI Comments: Sarah Garrison is a 35 y.o. female with PMHx of Asthma, DM, GERD, and HTN who presents to the Emergency Department complaining of bilateral persistant conjunctivitis onset 2 days. She states she has not tried anything to relieve symptoms. Pt reports associated facial swelling around eyes, bilateral eye discharge, eye pain. Pt states her children do not have pink eye. She works in a [employer], but she wears protective goggles throughout the day. Denies fever  Past Medical History:  Diagnosis Date  . Asthma   . Diabetes mellitus without complication ([place])   . GERD (gastroesophageal reflux disease)   . Hypertension     Patient Active Problem List   Diagnosis Date Noted  . Encounter for pregnancy test 02/01/2016  . Leg swelling 09/29/2015  . Morbid obesity ([place]) 09/29/2015  . DM neuropathy, type II diabetes mellitus ([place]) 08/24/2015    Past Surgical History:  Procedure Laterality Date  . CESAREAN SECTION    . TUBAL LIGATION    . WISDOM TOOTH EXTRACTION      OB History    Gravida Para Term Preterm AB Living   5 4     1 4    SAB TAB Ectopic Multiple Live Births   1               Home Medications    Prior to Admission medications   Medication Sig Start Date End Date Taking? Authorizing Provider  ACCU-CHEK SOFTCLIX LANCETS lancets USE AS INSTRUCTED 10/14/15   Arnoldo Morale, MD  albuterol (PROVENTIL HFA;VENTOLIN HFA) 108 (90 Base) MCG/ACT inhaler Inhale 2 puffs into the lungs every  6 (six) hours as needed for wheezing or shortness of breath. Reported on 09/29/2015 02/01/16   Maren Reamer, MD  amLODipine (NORVASC) 2.5 MG tablet Take 1 tablet (2.5 mg total) by mouth daily. Reported on 09/29/2015 02/01/16   Maren Reamer, MD  amoxicillin-clavulanate (AUGMENTIN) 875-125 MG tablet Take 1 tablet by mouth every 12 (twelve) hours. 02/24/16   Junius Creamer, NP  b complex vitamins capsule Take 1 capsule by mouth daily.    Historical Provider, MD  Blood Glucose Monitoring Suppl (ACCU-CHEK AVIVA PLUS) w/Device KIT 1 each by Does not apply route 3 (three) times daily. 09/13/15   Tresa Garter, MD  butalbital-acetaminophen-caffeine (FIORICET, ESGIC) 50-325-40 MG tablet Take 1 tablet by mouth 2 (two) times daily as needed for headache. Patient not taking: Reported on 01/10/2016 09/07/15   Tresa Garter, MD  calcium carbonate (TUMS - DOSED IN MG ELEMENTAL CALCIUM) 500 MG chewable tablet Chew 3 tablets by mouth 4 (four) times daily as needed for indigestion or heartburn.    Historical Provider, MD  cyclobenzaprine (FLEXERIL) 10 MG tablet Take 1 tablet (10 mg total) by mouth 3 (three) times daily as needed for muscle spasms. Patient not taking: Reported on 01/10/2016 12/14/15   Jola Schmidt, MD  gabapentin (NEURONTIN) 300 MG capsule Take 1 capsule (300 mg total) by mouth 3 (three) times daily. 02/01/16  Maren Reamer, MD  glucose blood test strip Use as instructed 02/01/16   Maren Reamer, MD  ibuprofen (ADVIL,MOTRIN) 600 MG tablet Take 1 tablet (600 mg total) by mouth every 8 (eight) hours as needed. 12/14/15   Jola Schmidt, MD  Insulin Glargine (LANTUS SOLOSTAR) 100 UNIT/ML Solostar Pen Inject 45 Units into the skin daily at 10 pm. Discontinue previous dose 02/01/16   Maren Reamer, MD  Insulin Pen Needle (ULTICARE PEN NEEDLES) 29G X 12.7MM MISC 1 each by Does not apply route at bedtime. 02/01/16   Maren Reamer, MD  Lancets (ACCU-CHEK SOFT TOUCH) lancets Use as instructed for once  daily blood glucose testing 02/01/16   Maren Reamer, MD  metFORMIN (GLUCOPHAGE XR) 500 MG 24 hr tablet Take 1 tablet (500 mg total) by mouth daily with breakfast. 02/01/16   Maren Reamer, MD  mometasone-formoterol (DULERA) 100-5 MCG/ACT AERO Inhale 2 puffs into the lungs 2 (two) times daily. 02/01/16   Maren Reamer, MD  Multiple Vitamins-Minerals (CENTRUM WOMEN PO) Take 1 tablet by mouth daily.    Historical Provider, MD  naproxen (NAPROSYN) 500 MG tablet Take 1 tablet (500 mg total) by mouth 2 (two) times daily. 02/09/16   April Palumbo, MD  pantoprazole (PROTONIX) 40 MG tablet Take 1 tablet (40 mg total) by mouth daily. 02/01/16   Maren Reamer, MD  sucralfate (CARAFATE) 1 GM/10ML suspension Take 10 mLs (1 g total) by mouth 4 (four) times daily -  with meals and at bedtime. 02/01/16   Maren Reamer, MD    Family History Family History  Problem Relation Age of Onset  . Asthma Mother   . Asthma Father     Social History Social History  Substance Use Topics  . Smoking status: Former Smoker    Packs/day: 0.00    Types: Cigarettes    Quit date: 11/02/2013  . Smokeless tobacco: Never Used  . Alcohol use No     Allergies   Morphine and related; Metformin and related; and Glyburide   Review of Systems Review of Systems  Constitutional: Negative for fever.  HENT: Positive for facial swelling.   Eyes: Positive for pain and discharge.   Physical Exam Updated Vital Signs BP (!) 158/118 (BP Location: Right Arm)   Pulse 93   Temp 98 F (36.7 C) (Oral)   Resp 17   Ht 5' 5"  (1.651 m)   Wt 113.9 kg   LMP 01/31/2016   SpO2 99%   BMI 41.77 kg/m   Physical Exam  Constitutional: She appears well-developed and well-nourished. She is active. No distress.  HENT:  Head: Normocephalic and atraumatic.  Right Ear: Tympanic membrane is bulging.  Eyes:  Swelling and erythema of the right upper eye lid with tenderness Minimal left lower lid swelling and erythema Tearing of  both eyes Swelling of the right zygomatic area to the right ear Pain with right lateral movement of the right eye  Cardiovascular: Normal rate.   Pulmonary/Chest: Effort normal. No respiratory distress.  Neurological: She is alert.  Skin: Skin is warm and dry.  Psychiatric: She has a normal mood and affect. Her behavior is normal.  Nursing note and vitals reviewed.    ED Treatments / Results  DIAGNOSTIC STUDIES: Oxygen Saturation is 99% on RA, normal by my interpretation.    COORDINATION OF CARE: 11:58 PM Discussed treatment plan with pt at bedside which includes CBC and CT Orbits and pt agreed to plan.  Labs (  all labs ordered are listed, but only abnormal results are displayed) Labs Reviewed  CBG MONITORING, ED - Abnormal; Notable for the following:       Result Value   Glucose-Capillary 317 (*)    All other components within normal limits  I-STAT CHEM 8, ED - Abnormal; Notable for the following:    Glucose, Bld 321 (*)    All other components within normal limits  CBC WITH DIFFERENTIAL/PLATELET    EKG  EKG Interpretation None       Radiology No results found.  Procedures Procedures (including critical care time)  Medications Ordered in ED Medications  iopamidol (ISOVUE-300) 61 % injection 75 mL (75 mLs Intravenous Contrast Given 02/24/16 0042)  amoxicillin-clavulanate (AUGMENTIN) 875-125 MG per tablet 1 tablet (1 tablet Oral Given 02/24/16 0222)     Initial Impression / Assessment and Plan / ED Course  I have reviewed the triage vital signs and the nursing notes.  Pertinent labs & imaging results that were available during my care of the patient were reviewed by me and considered in my medical decision making (see chart for details).  Clinical Course  I personally performed the services described in this documentation, which was scribed in my presence. The recorded information has been reviewed and is accurate. CT scan does not show a deep infection but some  orbital cellulitis.  Patient has been started on Augmentin.  Recommend follow-up with her primary care physician in the next 2-3 days and monitoring her blood sugar Final Clinical Impressions(s) / ED Diagnoses   Final diagnoses:  Periorbital cellulitis of right eye    New Prescriptions New Prescriptions   AMOXICILLIN-CLAVULANATE (AUGMENTIN) 875-125 MG TABLET    Take 1 tablet by mouth every 12 (twelve) hours.     Junius Creamer, NP 02/24/16 0459    Sherwood Gambler, MD 02/29/16 [phone]

## 2016-02-23 NOTE — ED Triage Notes (Addendum)
PT C/O BILATERAL EYE DRAINAGE, MILD FACIAL SWELLING, AND PAIN X2 DAYS. PT HAS CRUSTED EYES UPON WAKING AND CONSTANT DRAINAGE THROUGHOUT THE DAY. DENIES FEVER.

## 2016-02-24 ENCOUNTER — Encounter (HOSPITAL_COMMUNITY): Payer: Self-pay

## 2016-02-24 ENCOUNTER — Emergency Department (HOSPITAL_COMMUNITY): Payer: Medicaid Other

## 2016-02-24 LAB — CBC WITH DIFFERENTIAL/PLATELET
Basophils Absolute: 0 10*3/uL (ref 0.0–0.1)
Basophils Relative: 0 %
EOS ABS: 0.1 10*3/uL (ref 0.0–0.7)
EOS PCT: 1 %
HCT: 39.3 % (ref 36.0–46.0)
Hemoglobin: 13.2 g/dL (ref 12.0–15.0)
LYMPHS ABS: 3.2 10*3/uL (ref 0.7–4.0)
Lymphocytes Relative: 49 %
MCH: 29.1 pg (ref 26.0–34.0)
MCHC: 33.6 g/dL (ref 30.0–36.0)
MCV: 86.8 fL (ref 78.0–100.0)
MONO ABS: 0.4 10*3/uL (ref 0.1–1.0)
Monocytes Relative: 7 %
Neutro Abs: 2.8 10*3/uL (ref 1.7–7.7)
Neutrophils Relative %: 43 %
PLATELETS: 325 10*3/uL (ref 150–400)
RBC: 4.53 MIL/uL (ref 3.87–5.11)
RDW: 12.4 % (ref 11.5–15.5)
WBC: 6.5 10*3/uL (ref 4.0–10.5)

## 2016-02-24 LAB — I-STAT CHEM 8, ED
BUN: 8 mg/dL (ref 6–20)
CALCIUM ION: 1.17 mmol/L (ref 1.15–1.40)
CHLORIDE: 104 mmol/L (ref 101–111)
Creatinine, Ser: 0.6 mg/dL (ref 0.44–1.00)
Glucose, Bld: 321 mg/dL — ABNORMAL HIGH (ref 65–99)
HCT: 40 % (ref 36.0–46.0)
HEMOGLOBIN: 13.6 g/dL (ref 12.0–15.0)
Potassium: 4 mmol/L (ref 3.5–5.1)
SODIUM: 139 mmol/L (ref 135–145)
TCO2: 23 mmol/L (ref 0–100)

## 2016-02-24 MED ORDER — AMOXICILLIN-POT CLAVULANATE 875-125 MG PO TABS
1.0000 | ORAL_TABLET | Freq: Once | ORAL | Status: AC
  Administered 2016-02-24: 1 via ORAL
  Filled 2016-02-24: qty 1

## 2016-02-24 MED ORDER — AMOXICILLIN-POT CLAVULANATE 875-125 MG PO TABS: 1.0000 | ORAL_TABLET | Freq: Two times a day (BID) | ORAL | 0 refills | Status: DC

## 2016-02-24 MED ORDER — IOPAMIDOL (ISOVUE-300) INJECTION 61%
75.0000 mL | Freq: Once | INTRAVENOUS | Status: AC | PRN
  Administered 2016-02-24: 75 mL via INTRAVENOUS

## 2016-02-24 NOTE — Discharge Instructions (Signed)
Your CT scan does not show a deep infection.  U been treated with antibiotics.  Please take this as directed until all tablets have been completed.  I would like you to follow-up with your primary care physician in the next several days and monitor your blood sugars

## 2016-02-24 NOTE — ED Notes (Signed)
Pt ambulated to CT with tech

## 2016-02-29 ENCOUNTER — Emergency Department (HOSPITAL_COMMUNITY)
Admission: EM | Admit: 2016-02-29 | Discharge: 2016-02-29 | Disposition: A | Discharge status: Home / Self Care | Payer: Medicaid Other | Attending: Emergency Medicine | Admitting: Emergency Medicine

## 2016-02-29 ENCOUNTER — Encounter (HOSPITAL_COMMUNITY): Payer: Self-pay | Admitting: Emergency Medicine

## 2016-02-29 ENCOUNTER — Emergency Department (HOSPITAL_COMMUNITY): Payer: Medicaid Other

## 2016-02-29 DIAGNOSIS — R0602 Shortness of breath: Secondary | ICD-10-CM | POA: Diagnosis present

## 2016-02-29 DIAGNOSIS — Z794 Long term (current) use of insulin: Secondary | ICD-10-CM | POA: Insufficient documentation

## 2016-02-29 DIAGNOSIS — J45901 Unspecified asthma with (acute) exacerbation: Secondary | ICD-10-CM | POA: Diagnosis not present

## 2016-02-29 DIAGNOSIS — I10 Essential (primary) hypertension: Secondary | ICD-10-CM | POA: Diagnosis not present

## 2016-02-29 DIAGNOSIS — Z87891 Personal history of nicotine dependence: Secondary | ICD-10-CM | POA: Insufficient documentation

## 2016-02-29 DIAGNOSIS — Z79899 Other long term (current) drug therapy: Secondary | ICD-10-CM | POA: Insufficient documentation

## 2016-02-29 DIAGNOSIS — E119 Type 2 diabetes mellitus without complications: Secondary | ICD-10-CM | POA: Diagnosis not present

## 2016-02-29 LAB — BASIC METABOLIC PANEL
Anion gap: 6 (ref 5–15)
BUN: 11 mg/dL (ref 6–20)
CALCIUM: 9.1 mg/dL (ref 8.9–10.3)
CO2: 24 mmol/L (ref 22–32)
CREATININE: 0.7 mg/dL (ref 0.44–1.00)
Chloride: 106 mmol/L (ref 101–111)
GFR calc non Af Amer: >60 mL/min (ref >60)
Glucose, Bld: 262 mg/dL — ABNORMAL HIGH (ref 65–99)
Potassium: 3.9 mmol/L (ref 3.5–5.1)
SODIUM: 136 mmol/L (ref 135–145)

## 2016-02-29 MED ORDER — PREDNISONE 20 MG PO TABS: 60.0000 mg | ORAL_TABLET | Freq: Every day | ORAL | 0 refills | Status: DC

## 2016-02-29 MED ORDER — PREDNISONE 20 MG PO TABS
60.0000 mg | ORAL_TABLET | Freq: Once | ORAL | Status: AC
  Administered 2016-02-29: 60 mg via ORAL
  Filled 2016-02-29: qty 3

## 2016-02-29 MED ORDER — ALBUTEROL (5 MG/ML) CONTINUOUS INHALATION SOLN
10.0000 mg/h | INHALATION_SOLUTION | RESPIRATORY_TRACT | Status: DC
  Administered 2016-02-29: 10 mg/h via RESPIRATORY_TRACT
  Filled 2016-02-29: qty 20

## 2016-02-29 MED ORDER — ALBUTEROL SULFATE (2.5 MG/3ML) 0.083% IN NEBU: 5.0000 mg | INHALATION_SOLUTION | Freq: Once | RESPIRATORY_TRACT | Status: DC

## 2016-02-29 MED ORDER — ALBUTEROL SULFATE (2.5 MG/3ML) 0.083% IN NEBU
5.0000 mg | INHALATION_SOLUTION | Freq: Once | RESPIRATORY_TRACT | Status: AC
  Administered 2016-02-29: 5 mg via RESPIRATORY_TRACT
  Filled 2016-02-29: qty 6

## 2016-02-29 MED ORDER — IPRATROPIUM BROMIDE 0.02 % IN SOLN
0.5000 mg | Freq: Once | RESPIRATORY_TRACT | Status: AC
  Administered 2016-02-29: 0.5 mg via RESPIRATORY_TRACT
  Filled 2016-02-29: qty 2.5

## 2016-02-29 MED ORDER — ALBUTEROL SULFATE HFA 108 (90 BASE) MCG/ACT IN AERS: 2.0000 | INHALATION_SPRAY | Freq: Four times a day (QID) | RESPIRATORY_TRACT | 12 refills | Status: DC | PRN

## 2016-02-29 NOTE — ED Notes (Signed)
Respiratory called for breathing treatment.

## 2016-02-29 NOTE — ED Triage Notes (Signed)
Per pt, states she has'nt been able to "breath right" since having a CT scan with IV contrast last Thurs-states she has a history of asthma-inhaler not working-states dyspnea with rest/lying down

## 2016-02-29 NOTE — ED Notes (Signed)
PA at bedside.

## 2016-02-29 NOTE — ED Provider Notes (Signed)
[facility] DEPT Provider Note   CSN: [id number] Arrival date & time: 02/29/16  1497     History   Chief Complaint Chief Complaint  Patient presents with  . Shortness of Breath    HPI Sarah Garrison is a 35 y.o. female.  Patient is 35 yo F with PMH of asthma, diabetes, and hypertension, presenting with chief complaint of constant shortness of breath and chest tightness since last Thursday. Patient was seen in ED last Thursday for periorbital cellulitis, had facial CT with contrast, and started on Augmentin. Denies any known allergy to contrast dye or Augmentin. No positional or exertional dyspnea, but states shortness of breath worse at night. She has been using her albuterol inhaler which provides some relief. She states her asthma has been mild and she infrequently uses her inhaler, but was seen by PCP in August for asthma exacerbation. She denies any fever, chills, facial pain, sinus congestion, rhinorrhea, cough, chest pain, or palpitations. She denies any alcohol, tobacco, or drug use.      Past Medical History:  Diagnosis Date  . Asthma   . Diabetes mellitus without complication ([place])   . GERD (gastroesophageal reflux disease)   . Hypertension     Patient Active Problem List   Diagnosis Date Noted  . Encounter for pregnancy test 02/01/2016  . Leg swelling 09/29/2015  . Morbid obesity ([place]) 09/29/2015  . DM neuropathy, type II diabetes mellitus ([place]) 08/24/2015    Past Surgical History:  Procedure Laterality Date  . CESAREAN SECTION    . TUBAL LIGATION    . WISDOM TOOTH EXTRACTION      OB History    Gravida Para Term Preterm AB Living   5 4     1 4    SAB TAB Ectopic Multiple Live Births   1               Home Medications    Prior to Admission medications   Medication Sig Start Date End Date Taking? Authorizing Provider  ACCU-CHEK SOFTCLIX LANCETS lancets USE AS INSTRUCTED 10/14/15   Arnoldo Morale, MD  albuterol (PROVENTIL HFA;VENTOLIN HFA) 108 (90  Base) MCG/ACT inhaler Inhale 2 puffs into the lungs every 6 (six) hours as needed for wheezing or shortness of breath. Reported on 09/29/2015 02/01/16   Maren Reamer, MD  amLODipine (NORVASC) 2.5 MG tablet Take 1 tablet (2.5 mg total) by mouth daily. Reported on 09/29/2015 02/01/16   Maren Reamer, MD  amoxicillin-clavulanate (AUGMENTIN) 875-125 MG tablet Take 1 tablet by mouth every 12 (twelve) hours. 02/24/16   Junius Creamer, NP  b complex vitamins capsule Take 1 capsule by mouth daily.    Historical Provider, MD  Blood Glucose Monitoring Suppl (ACCU-CHEK AVIVA PLUS) w/Device KIT 1 each by Does not apply route 3 (three) times daily. 09/13/15   Tresa Garter, MD  butalbital-acetaminophen-caffeine (FIORICET, ESGIC) 50-325-40 MG tablet Take 1 tablet by mouth 2 (two) times daily as needed for headache. Patient not taking: Reported on 01/10/2016 09/07/15   Tresa Garter, MD  calcium carbonate (TUMS - DOSED IN MG ELEMENTAL CALCIUM) 500 MG chewable tablet Chew 3 tablets by mouth 4 (four) times daily as needed for indigestion or heartburn.    Historical Provider, MD  cyclobenzaprine (FLEXERIL) 10 MG tablet Take 1 tablet (10 mg total) by mouth 3 (three) times daily as needed for muscle spasms. Patient not taking: Reported on 01/10/2016 12/14/15   Jola Schmidt, MD  gabapentin (NEURONTIN) 300 MG capsule Take 1  capsule (300 mg total) by mouth 3 (three) times daily. 02/01/16   Maren Reamer, MD  glucose blood test strip Use as instructed 02/01/16   Maren Reamer, MD  ibuprofen (ADVIL,MOTRIN) 600 MG tablet Take 1 tablet (600 mg total) by mouth every 8 (eight) hours as needed. 12/14/15   Jola Schmidt, MD  Insulin Glargine (LANTUS SOLOSTAR) 100 UNIT/ML Solostar Pen Inject 45 Units into the skin daily at 10 pm. Discontinue previous dose 02/01/16   Maren Reamer, MD  Insulin Pen Needle (ULTICARE PEN NEEDLES) 29G X 12.7MM MISC 1 each by Does not apply route at bedtime. 02/01/16   Maren Reamer, MD  Lancets  (ACCU-CHEK SOFT TOUCH) lancets Use as instructed for once daily blood glucose testing 02/01/16   Maren Reamer, MD  metFORMIN (GLUCOPHAGE XR) 500 MG 24 hr tablet Take 1 tablet (500 mg total) by mouth daily with breakfast. 02/01/16   Maren Reamer, MD  mometasone-formoterol (DULERA) 100-5 MCG/ACT AERO Inhale 2 puffs into the lungs 2 (two) times daily. 02/01/16   Maren Reamer, MD  Multiple Vitamins-Minerals (CENTRUM WOMEN PO) Take 1 tablet by mouth daily.    Historical Provider, MD  naproxen (NAPROSYN) 500 MG tablet Take 1 tablet (500 mg total) by mouth 2 (two) times daily. 02/09/16   April Palumbo, MD  pantoprazole (PROTONIX) 40 MG tablet Take 1 tablet (40 mg total) by mouth daily. 02/01/16   Maren Reamer, MD  sucralfate (CARAFATE) 1 GM/10ML suspension Take 10 mLs (1 g total) by mouth 4 (four) times daily -  with meals and at bedtime. 02/01/16   Maren Reamer, MD    Family History Family History  Problem Relation Age of Onset  . Asthma Mother   . Asthma Father     Social History Social History  Substance Use Topics  . Smoking status: Former Smoker    Packs/day: 0.00    Types: Cigarettes    Quit date: 11/02/2013  . Smokeless tobacco: Never Used  . Alcohol use No     Allergies   Morphine and related; Metformin and related; and Glyburide   Review of Systems Review of Systems  Constitutional: Negative for chills and fever.  HENT: Negative for congestion, ear pain, sore throat and trouble swallowing.   Eyes: Negative for pain and visual disturbance.  Respiratory: Positive for chest tightness and shortness of breath. Negative for cough.   Cardiovascular: Negative for chest pain and palpitations.  Gastrointestinal: Negative for abdominal pain, blood in stool and vomiting.  Genitourinary: Negative for dysuria and hematuria.  Musculoskeletal: Negative for back pain and neck pain.  Skin: Negative for color change and rash.  Neurological: Negative for dizziness, seizures,  syncope and headaches.  All other systems reviewed and are negative.    Physical Exam Updated Vital Signs BP 124/93 (BP Location: Left Arm)   Pulse 81   Temp 98.3 F (36.8 C) (Oral)   Resp 16   Ht 5' 5"  (1.651 m)   Wt 105.2 kg   LMP 01/31/2016   SpO2 100%   BMI 38.61 kg/m   Physical Exam  Constitutional:  Obese female, lying in bed, oxygenating 100% on room air, no acute distress   HENT:  Head: Normocephalic and atraumatic.  Mouth/Throat: Oropharynx is clear and moist.  Eyes: Conjunctivae are normal.  Neck: Normal range of motion. Neck supple.  Cardiovascular: Normal rate, regular rhythm, normal heart sounds and intact distal pulses.   Pulmonary/Chest: Effort normal. No stridor. No  respiratory distress.  Diminished lung sounds. Physical exam and ausculation limited by patient's body habitus  Abdominal: Soft. There is no tenderness.  Musculoskeletal: Normal range of motion. She exhibits no edema or tenderness.  Neurological: She is alert.  Skin: Skin is warm and dry. No rash noted.  Psychiatric: She has a normal mood and affect.  Nursing note and vitals reviewed.    ED Treatments / Results  Labs (all labs ordered are listed, but only abnormal results are displayed) Labs Reviewed  BASIC METABOLIC PANEL    EKG  EKG Interpretation  Date/Time:  Wednesday February 29 2016 09:53:54 EDT Ventricular Rate:  85 PR Interval:    QRS Duration: 95 QT Interval:  355 QTC Calculation: 423 R Axis:   80 Text Interpretation:  Sinus rhythm Artifact unremarkable ECG Confirmed by Carmin Muskrat  MD ([id number]) on 02/29/2016 10:16:31 AM       Radiology Dg Chest 2 View  Result Date: 02/29/2016 CLINICAL DATA:  Shortness of breath EXAM: CHEST  2 VIEW COMPARISON:  August 04, 2015 FINDINGS: Lungs are clear. Heart size and pulmonary vascularity are normal. No adenopathy. No bone lesions. IMPRESSION: No edema or consolidation. Electronically Signed   By: Lowella Grip III M.D.   On:  02/29/2016 10:24    Procedures Procedures (including critical care time)  Medications Ordered in ED Medications  albuterol (PROVENTIL) (2.5 MG/3ML) 0.083% nebulizer solution 5 mg (not administered)  albuterol (PROVENTIL) (2.5 MG/3ML) 0.083% nebulizer solution 5 mg (not administered)  ipratropium (ATROVENT) nebulizer solution 0.5 mg (not administered)     Initial Impression / Assessment and Plan / ED Course  I have reviewed the triage vital signs and the nursing notes.  Pertinent labs & imaging results that were available during my care of the patient were reviewed by me and considered in my medical decision making (see chart for details).  Clinical Course   Patient is 35 yo F with hx of asthma, presenting with shortness of breath and chest tightness. Patient states symptoms started after CT w/ contrast last Thursday, but no s/s of allergic reaction. BMP ordered and creatine of 0.70. Glucose 262 but electrolytes otherwise normal. No complaints of chest pain and low risk factors for PE. CXR ordered and negative. EKG sinus rhythm.  Likely asthma exacerbation. Albuterol and ipratropium ordered, as well as prednisone 60 mg.  Reassessed and patient states feeling better, but still experiencing mild shortness of breath. Better aeration and breath sounds clear bilaterally. Ordered continuous neb.  Aeration continued to improve on reassessment. Patient requesting to go home and stable for d/c. Refilled prescription MDI and sent home with prednisone 60 mg x 4 days. Patient notified of glucose 262 and advised to f/u with PCP for management of diabetes. Referral to endocrinology also provided. Educated that prednisone can elevate blood glucose but to complete course of medication. Strict return precautions for worsening symptoms including chest pain, shortness of breath, or respiratory distress.  Final Clinical Impressions(s) / ED Diagnoses   Final diagnoses:  Asthma exacerbation    New  Prescriptions New Prescriptions   No medications on file     Rosilyn Mings II, [place] 02/29/16 1454    Carmin Muskrat, MD 03/02/16 1011

## 2016-03-07 ENCOUNTER — Ambulatory Visit: Payer: Medicaid Other | Admitting: Internal Medicine

## 2016-03-23 ENCOUNTER — Emergency Department (HOSPITAL_COMMUNITY)
Admission: EM | Admit: 2016-03-23 | Discharge: 2016-03-24 | Disposition: A | Discharge status: Home / Self Care | Payer: Medicaid Other | Attending: Emergency Medicine | Admitting: Emergency Medicine

## 2016-03-23 ENCOUNTER — Encounter (HOSPITAL_COMMUNITY): Payer: Self-pay | Admitting: Emergency Medicine

## 2016-03-23 DIAGNOSIS — I10 Essential (primary) hypertension: Secondary | ICD-10-CM | POA: Diagnosis not present

## 2016-03-23 DIAGNOSIS — E1140 Type 2 diabetes mellitus with diabetic neuropathy, unspecified: Secondary | ICD-10-CM | POA: Insufficient documentation

## 2016-03-23 DIAGNOSIS — Z7984 Long term (current) use of oral hypoglycemic drugs: Secondary | ICD-10-CM | POA: Insufficient documentation

## 2016-03-23 DIAGNOSIS — K140 Glossitis: Secondary | ICD-10-CM

## 2016-03-23 DIAGNOSIS — B370 Candidal stomatitis: Secondary | ICD-10-CM | POA: Diagnosis present

## 2016-03-23 DIAGNOSIS — Z794 Long term (current) use of insulin: Secondary | ICD-10-CM | POA: Insufficient documentation

## 2016-03-23 DIAGNOSIS — Z87891 Personal history of nicotine dependence: Secondary | ICD-10-CM | POA: Diagnosis not present

## 2016-03-23 DIAGNOSIS — J45909 Unspecified asthma, uncomplicated: Secondary | ICD-10-CM | POA: Insufficient documentation

## 2016-03-23 NOTE — ED Triage Notes (Signed)
Pt states about the 13th she was seen here for a white coating on her tongue and we diagnosed her with thrush. She was sent home with amoxicillin.  She had no relief and went to urgent care on the 17th and they gave her a oral suspension to take that contained lidocaine and a antacid in it.  Pt has it with her.  States now her tongue is blistered up, she is having painful swallowing. Pt does have affected speech during assessment.

## 2016-03-24 MED ORDER — CHLORHEXIDINE GLUCONATE 0.12 % MT SOLN: 15.0000 mL | Freq: Two times a day (BID) | OROMUCOSAL | 0 refills | Status: AC

## 2016-03-24 MED ORDER — NYSTATIN 100000 UNIT/ML MT SUSP: 5.0000 mL | Freq: Four times a day (QID) | OROMUCOSAL | 0 refills | Status: AC

## 2016-03-24 NOTE — ED Provider Notes (Signed)
[facility] DEPT Provider Note   CSN: [id number] Arrival date & time: 03/23/16  2300     History   Chief Complaint Chief Complaint  Patient presents with  . Thrush  . Sore Throat    HPI Sarah Garrison is a 35 y.o. Garrison.  The history is provided by the patient.  Mouth Lesions  Location:  Tongue Quality:  Red, white and painful Pain details:    Quality:  Aching and burning   Severity:  Moderate   Timing:  Constant Onset quality:  Gradual Severity:  Moderate Duration:  5 days Progression:  Worsening Chronicity:  New Context comment:  Recent antibiotics, concern for thrush Worsened by:  Heat Ineffective treatments: lidocaine syrup. Associated symptoms: no fever, no rash and no sore throat     Past Medical History:  Diagnosis Date  . Asthma   . Diabetes mellitus without complication ([place])   . GERD (gastroesophageal reflux disease)   . Hypertension     Patient Active Problem List   Diagnosis Date Noted  . Encounter for pregnancy test 02/01/2016  . Leg swelling 09/29/2015  . Morbid obesity ([place]) 09/29/2015  . DM neuropathy, type II diabetes mellitus ([place]) 08/24/2015    Past Surgical History:  Procedure Laterality Date  . CESAREAN SECTION    . TUBAL LIGATION    . WISDOM TOOTH EXTRACTION      OB History    Gravida Para Term Preterm AB Living   5 4     1 4    SAB TAB Ectopic Multiple Live Births   1               Home Medications    Prior to Admission medications   Medication Sig Start Date End Date Taking? Authorizing Provider  ACCU-CHEK SOFTCLIX LANCETS lancets USE AS INSTRUCTED 10/14/15   Arnoldo Morale, MD  albuterol (PROVENTIL HFA;VENTOLIN HFA) 108 (90 Base) MCG/ACT inhaler Inhale 2 puffs into the lungs every 6 (six) hours as needed for wheezing or shortness of breath. Reported on 09/29/2015 02/29/16   Daryl F de Villier II, PA  amLODipine (NORVASC) 2.5 MG tablet Take 1 tablet (2.5 mg total) by mouth daily. Reported on 09/29/2015 02/01/16   Maren Reamer, MD  amoxicillin-clavulanate (AUGMENTIN) 875-125 MG tablet Take 1 tablet by mouth every 12 (twelve) hours. 02/24/16   Junius Creamer, NP  b complex vitamins capsule Take 1 capsule by mouth daily.    Historical Provider, MD  Blood Glucose Monitoring Suppl (ACCU-CHEK AVIVA PLUS) w/Device KIT 1 each by Does not apply route 3 (three) times daily. 09/13/15   Tresa Garter, MD  butalbital-acetaminophen-caffeine (FIORICET, ESGIC) 50-325-40 MG tablet Take 1 tablet by mouth 2 (two) times daily as needed for headache. Patient not taking: Reported on 02/29/2016 09/07/15   Tresa Garter, MD  cyclobenzaprine (FLEXERIL) 10 MG tablet Take 1 tablet (10 mg total) by mouth 3 (three) times daily as needed for muscle spasms. Patient not taking: Reported on 02/29/2016 12/14/15   Jola Schmidt, MD  gabapentin (NEURONTIN) 300 MG capsule Take 1 capsule (300 mg total) by mouth 3 (three) times daily. Patient taking differently: Take 300 mg by mouth 2 (two) times daily.  02/01/16   Maren Reamer, MD  glucose blood test strip Use as instructed 02/01/16   Maren Reamer, MD  ibuprofen (ADVIL,MOTRIN) 600 MG tablet Take 1 tablet (600 mg total) by mouth every 8 (eight) hours as needed. Patient taking differently: Take 600 mg by mouth every  8 (eight) hours as needed for headache or moderate pain.  12/14/15   Jola Schmidt, MD  Insulin Glargine (LANTUS SOLOSTAR) 100 UNIT/ML Solostar Pen Inject 45 Units into the skin daily at 10 pm. Discontinue previous dose 02/01/16   Maren Reamer, MD  Insulin Pen Needle (ULTICARE PEN NEEDLES) 29G X 12.7MM MISC 1 each by Does not apply route at bedtime. 02/01/16   Maren Reamer, MD  Lancets (ACCU-CHEK SOFT TOUCH) lancets Use as instructed for once daily blood glucose testing 02/01/16   Maren Reamer, MD  metFORMIN (GLUCOPHAGE XR) 500 MG 24 hr tablet Take 1 tablet (500 mg total) by mouth daily with breakfast. Patient not taking: Reported on 02/29/2016 02/01/16   Maren Reamer,  MD  mometasone-formoterol (DULERA) 100-5 MCG/ACT AERO Inhale 2 puffs into the lungs 2 (two) times daily. Patient not taking: Reported on 02/29/2016 02/01/16   Maren Reamer, MD  naproxen (NAPROSYN) 500 MG tablet Take 1 tablet (500 mg total) by mouth 2 (two) times daily. Patient taking differently: Take 500 mg by mouth 2 (two) times daily as needed for moderate pain or headache.  02/09/16   April Palumbo, MD  pantoprazole (PROTONIX) 40 MG tablet Take 1 tablet (40 mg total) by mouth daily. 02/01/16   Maren Reamer, MD  predniSONE (DELTASONE) 20 MG tablet Take 3 tablets (60 mg total) by mouth daily. 02/29/16   Daryl F de Villier II, PA  sucralfate (CARAFATE) 1 GM/10ML suspension Take 10 mLs (1 g total) by mouth 4 (four) times daily -  with meals and at bedtime. Patient not taking: Reported on 02/29/2016 02/01/16   Maren Reamer, MD    Family History Family History  Problem Relation Age of Onset  . Asthma Mother   . Asthma Father     Social History Social History  Substance Use Topics  . Smoking status: Former Smoker    Packs/day: 0.00    Types: Cigarettes    Quit date: 11/02/2013  . Smokeless tobacco: Never Used  . Alcohol use No     Allergies   Morphine and related; Metformin and related; and Glyburide   Review of Systems Review of Systems  Constitutional: Negative for fever.  HENT: Positive for mouth sores. Negative for sore throat.   Skin: Negative for rash.  All other systems reviewed and are negative.    Physical Exam Updated Vital Signs BP (!) 158/115 (BP Location: Right Arm)   Pulse 87   Temp 98.5 F (36.9 C) (Oral)   Resp 18   Ht 5' 4"  (1.626 m)   Wt 252 lb (114.3 kg)   LMP 02/01/2016   SpO2 98%   BMI 43.26 kg/m   Physical Exam  Constitutional: She is oriented to person, place, and time. She appears well-developed and well-nourished. No distress.  HENT:  Head: Normocephalic.  Nose: Nose normal.  Mouth/Throat: Oropharynx is clear and moist and mucous  membranes are normal.  Tongue is mildly erythematous with prominent papillae  Eyes: Conjunctivae are normal.  Neck: Neck supple. No tracheal deviation present.  Cardiovascular: Normal rate and regular rhythm.   Pulmonary/Chest: Effort normal. No respiratory distress.  Abdominal: Soft. She exhibits no distension.  Neurological: She is alert and oriented to person, place, and time.  Skin: Skin is warm and dry.  Psychiatric: She has a normal mood and affect.     ED Treatments / Results  Labs (all labs ordered are listed, but only abnormal results are displayed) Labs Reviewed -  No data to display  EKG  EKG Interpretation None       Radiology No results found.  Procedures Procedures (including critical care time)  Medications Ordered in ED Medications - No data to display   Initial Impression / Assessment and Plan / ED Course  I have reviewed the triage vital signs and the nursing notes.  Pertinent labs & imaging results that were available during my care of the patient were reviewed by me and considered in my medical decision making (see chart for details).  Clinical Course    35 y.o. Garrison presents with tongue pain after she had recent course of antibiotics and developed a white coating on her tongue. She went to urgent care who diagnosed her with thrush and gave her a viscous lidocaine solution. She went home and applied the solution and began scraping her tongue and sucking on ice. It appears she has no more white plaques but she has caused irritation of her tongue secondary to scraping it repetitively while numb. I recommended discontinuing the lidocaine solution, gave her nystatin suspension for any residual candida and peridex solution to rinse with instructing her to leave her tongue alone to allow it to heal.   Final Clinical Impressions(s) / ED Diagnoses   Final diagnoses:  Tongue inflammation    New Prescriptions New Prescriptions   No medications on file       Leo Grosser, MD 03/24/16 [phone]

## 2016-03-29 ENCOUNTER — Ambulatory Visit: Payer: Medicaid Other | Attending: Internal Medicine | Admitting: Physician Assistant

## 2016-03-29 VITALS — BP 128/85 | HR 108 | Temp 98.5°F | Resp 16 | Wt 245.4 lb

## 2016-03-29 DIAGNOSIS — Z79899 Other long term (current) drug therapy: Secondary | ICD-10-CM | POA: Diagnosis not present

## 2016-03-29 DIAGNOSIS — Z23 Encounter for immunization: Secondary | ICD-10-CM | POA: Diagnosis not present

## 2016-03-29 DIAGNOSIS — Z794 Long term (current) use of insulin: Secondary | ICD-10-CM | POA: Diagnosis not present

## 2016-03-29 DIAGNOSIS — B379 Candidiasis, unspecified: Secondary | ICD-10-CM

## 2016-03-29 DIAGNOSIS — K219 Gastro-esophageal reflux disease without esophagitis: Secondary | ICD-10-CM

## 2016-03-29 DIAGNOSIS — E1140 Type 2 diabetes mellitus with diabetic neuropathy, unspecified: Secondary | ICD-10-CM | POA: Insufficient documentation

## 2016-03-29 DIAGNOSIS — Z9114 Patient's other noncompliance with medication regimen: Secondary | ICD-10-CM | POA: Diagnosis not present

## 2016-03-29 LAB — GLUCOSE, POCT (MANUAL RESULT ENTRY)
POC GLUCOSE: 534 mg/dL — AB (ref 70–99)
POC Glucose: 500 mg/dl — AB (ref 70–99)
POC Glucose: 534 mg/dl — AB (ref 70–99)

## 2016-03-29 LAB — POCT URINALYSIS DIPSTICK
Bilirubin, UA: NEGATIVE
Glucose, UA: 500
KETONES UA: NEGATIVE
Nitrite, UA: NEGATIVE
PROTEIN UA: 3
SPEC GRAV UA: 1.025
Urobilinogen, UA: 0.2
pH, UA: 5.5

## 2016-03-29 MED ORDER — PANTOPRAZOLE SODIUM 40 MG PO TBEC: 40.0000 mg | DELAYED_RELEASE_TABLET | Freq: Every day | ORAL | 2 refills | Status: DC

## 2016-03-29 MED ORDER — INSULIN ASPART 100 UNIT/ML ~~LOC~~ SOLN
20.0000 [IU] | Freq: Once | SUBCUTANEOUS | Status: AC
  Administered 2016-03-29: 20 [IU] via SUBCUTANEOUS

## 2016-03-29 MED ORDER — FLUCONAZOLE 150 MG PO TABS: 150.0000 mg | ORAL_TABLET | Freq: Once | ORAL | 0 refills | Status: AC

## 2016-03-29 MED ORDER — INSULIN GLARGINE 100 UNIT/ML SOLOSTAR PEN: 45.0000 [IU] | PEN_INJECTOR | Freq: Every day | SUBCUTANEOUS | 3 refills | Status: DC

## 2016-03-29 MED ORDER — INSULIN ASPART 100 UNIT/ML ~~LOC~~ SOLN
10.0000 [IU] | Freq: Once | SUBCUTANEOUS | Status: AC
  Administered 2016-03-29: 10 [IU] via SUBCUTANEOUS

## 2016-03-29 MED ORDER — METFORMIN HCL 500 MG PO TABS: 500.0000 mg | ORAL_TABLET | Freq: Two times a day (BID) | ORAL | 3 refills | Status: DC

## 2016-03-29 MED ORDER — INSULIN PEN NEEDLE 29G X 5MM MISC: 1.0000 | Freq: Once | 3 refills | Status: AC

## 2016-03-29 MED FILL — metFORMIN HCL 500 MG TABS: 500 | 30 days supply | Qty: 60 | Fill #0

## 2016-03-29 MED FILL — LANTUS SOLOSTAR 100 UNITS/M: 100 | 33 days supply | Qty: 15 | Fill #0

## 2016-03-29 MED FILL — FLUCONAZOLE 150 MG TABLET: 150 | 1 days supply | Qty: 1 | Fill #0

## 2016-03-29 MED FILL — PANTOPRAZOLE SOD DR 40 MG T: 40 | 30 days supply | Qty: 30 | Fill #0

## 2016-03-29 NOTE — Progress Notes (Signed)
Pt is in the office today for a rash Pt states a fork went through toe

## 2016-03-29 NOTE — Patient Instructions (Addendum)
Pneumococcal Polysaccharide Vaccine: What You Need to Know  1. Why get vaccinated?  Vaccination can protect older adults (and some children and younger adults) from pneumococcal disease.  Pneumococcal disease is caused by bacteria that can spread from person to person through close contact. It can cause ear infections, and it can also lead to more serious infections of the:   · Lungs (pneumonia),  · Blood (bacteremia), and  · Covering of the brain and spinal cord (meningitis). Meningitis can cause deafness and brain damage, and it can be fatal.  Anyone can get pneumococcal disease, but children under 2 years of age, people with certain medical conditions, adults over 65 years of age, and cigarette smokers are at the highest risk.  About 18,000 older adults die each year from pneumococcal disease in the [place].  Treatment of pneumococcal infections with penicillin and other drugs used to be more effective. But some strains of the disease have become resistant to these drugs. This makes prevention of the disease, through vaccination, even more important.  2. Pneumococcal polysaccharide vaccine (PPSV23)  Pneumococcal polysaccharide vaccine (PPSV23) protects against 23 types of pneumococcal bacteria. It will not prevent all pneumococcal disease.  PPSV23 is recommended for:  · All adults 65 years of age and older,  · Anyone 2 through 34 years of age with certain long-term health problems,  · Anyone 2 through 34 years of age with a weakened immune system,  · Adults 19 through 34 years of age who smoke cigarettes or have asthma.  Most people need only one dose of PPSV. A second dose is recommended for certain high-risk groups. People 65 and older should get a dose even if they have gotten one or more doses of the vaccine before they turned 65.  Your healthcare provider can give you more information about these recommendations.  Most healthy adults develop protection within 2 to 3 weeks of getting the shot.  3. Some  people should not get this vaccine  · Anyone who has had a life-threatening allergic reaction to PPSV should not get another dose.  · Anyone who has a severe allergy to any component of PPSV should not receive it. Tell your provider if you have any severe allergies.  · Anyone who is moderately or severely ill when the shot is scheduled may be asked to wait until they recover before getting the vaccine. Someone with a mild illness can usually be vaccinated.  · Children less than 2 years of age should not receive this vaccine.  · There is no evidence that PPSV is harmful to either a pregnant woman or to her fetus. However, as a precaution, women who need the vaccine should be vaccinated before becoming pregnant, if possible.  4. Risks of a vaccine reaction  With any medicine, including vaccines, there is a chance of side effects. These are usually mild and go away on their own, but serious reactions are also possible.  About half of people who get PPSV have mild side effects, such as redness or pain where the shot is given, which go away within about two days.  Less than 1 out of 100 people develop a fever, muscle aches, or more severe local reactions.  Problems that could happen after any vaccine:  · People sometimes faint after a medical procedure, including vaccination. Sitting or lying down for about 15 minutes can help prevent fainting, and injuries caused by a fall. Tell your doctor if you feel dizzy, or have vision changes or   ringing in the ears.  · Some people get severe pain in the shoulder and have difficulty moving the arm where a shot was given. This happens very rarely.  · Any medication can cause a severe allergic reaction. Such reactions from a vaccine are very rare, estimated at about 1 in a million doses, and would happen within a few minutes to a few hours after the vaccination.  As with any medicine, there is a very remote chance of a vaccine causing a serious injury or death.  The safety of  vaccines is always being monitored. For more information, visit: www.cdc.gov/vaccinesafety/  5. What if there is a serious reaction?  What should I look for?  Look for anything that concerns you, such as signs of a severe allergic reaction, very high fever, or unusual behavior.   Signs of a severe allergic reaction can include hives, swelling of the face and throat, difficulty breathing, a fast heartbeat, dizziness, and weakness. These would usually start a few minutes to a few hours after the vaccination.  What should I do?  If you think it is a severe allergic reaction or other emergency that can't wait, call 9-1-1 or get to the nearest hospital. Otherwise, call your doctor.  Afterward, the reaction should be reported to the Vaccine Adverse Event Reporting System (VAERS). Your doctor might file this report, or you can do it yourself through the VAERS web site at www.vaers.hhs.gov, or by calling 1-800-822-7967.   VAERS does not give medical advice.  6. How can I learn more?  · Ask your doctor. He or she can give you the vaccine package insert or suggest other sources of information.  · Call your local or state health department.  · Contact the Centers for Disease Control and Prevention (CDC):    Call 1-800-232-4636 (1-800-CDC-INFO) or    Visit CDC's website at www.cdc.gov/vaccines  CDC Pneumococcal Polysaccharide Vaccine VIS (09/25/13)     This information is not intended to replace advice given to you by your health care provider. Make sure you discuss any questions you have with your health care provider.     Document Released: 03/18/2006 Document Revised: 06/11/2014 Document Reviewed: 09/28/2013  Elsevier Interactive Patient Education ©2016 [employer]

## 2016-03-29 NOTE — Progress Notes (Signed)
Sarah Garrison, is a 35 y.o. female  NZV:[id number]  IFB:[id number]  DOB - 05-13-1981  Subjective:  Chief Complaint and HPI: Sarah Garrison is a 35 y.o. female here today for a f/up on thrush.  She also thinks she has a vaginal yeast infection.  Her cbg = 412 during vital signs. She admits to poor compliance with her diabetes medications.  She says she might take her medications 50% of the time.  I doubt even that is accurate.  She admits to poor diet and no regular exercise.  She feels fine today.  Denies s/sx of hyper or hypo glycemia. Recent antibiotics and prednisone.  Her mouth is still sore from thrush and she is still using the nyastatin.  She wants smaller pen needles.  ED/Urgent care notes reviewed.    ROS:   Constitutional:  No f/c, No night sweats, No unexplained weight loss. EENT:  No vision changes, No blurry vision, No hearing changes. No mouth, throat, or ear problems.  Respiratory: No cough, No SOB Cardiac: No CP, no palpitations GI:  No abd pain, No N/V/D. GU: No Urinary s/sx Musculoskeletal: No joint pain Neuro: No headache, no dizziness, no motor weakness.  Skin: No rash Endocrine:  No polydipsia. No polyuria.  Psych: Denies SI/HI  No problems updated.  ALLERGIES: Allergies  Allergen Reactions  . Morphine And Related Shortness Of Breath  . Metformin And Related Diarrhea  . Glyburide Diarrhea    Nose bleeds, vision disruption    PAST MEDICAL HISTORY: Past Medical History:  Diagnosis Date  . Asthma   . Diabetes mellitus without complication ([place])   . GERD (gastroesophageal reflux disease)   . Hypertension     MEDICATIONS AT HOME: Prior to Admission medications   Medication Sig Start Date End Date Taking? Authorizing Provider  ACCU-CHEK SOFTCLIX LANCETS lancets USE AS INSTRUCTED 10/14/15   Arnoldo Morale, MD  albuterol (PROVENTIL HFA;VENTOLIN HFA) 108 (90 Base) MCG/ACT inhaler Inhale 2 puffs into the lungs every 6 (six) hours as needed for wheezing  or shortness of breath. Reported on 09/29/2015 02/29/16   Daryl F de Villier II, PA  amLODipine (NORVASC) 2.5 MG tablet Take 1 tablet (2.5 mg total) by mouth daily. Reported on 09/29/2015 02/01/16   Maren Reamer, MD  b complex vitamins capsule Take 1 capsule by mouth daily.    Historical Provider, MD  Blood Glucose Monitoring Suppl (ACCU-CHEK AVIVA PLUS) w/Device KIT 1 each by Does not apply route 3 (three) times daily. 09/13/15   Tresa Garter, MD  butalbital-acetaminophen-caffeine (FIORICET, ESGIC) 50-325-40 MG tablet Take 1 tablet by mouth 2 (two) times daily as needed for headache. Patient not taking: Reported on 02/29/2016 09/07/15   Tresa Garter, MD  chlorhexidine (PERIDEX) 0.12 % solution Use as directed 15 mLs in the mouth or throat 2 (two) times daily. 03/24/16 03/29/16  Leo Grosser, MD  cyclobenzaprine (FLEXERIL) 10 MG tablet Take 1 tablet (10 mg total) by mouth 3 (three) times daily as needed for muscle spasms. Patient not taking: Reported on 02/29/2016 12/14/15   Jola Schmidt, MD  fluconazole (DIFLUCAN) 150 MG tablet Take 1 tablet (150 mg total) by mouth once. 03/29/16 03/29/16  Argentina Donovan, PA-C  gabapentin (NEURONTIN) 300 MG capsule Take 1 capsule (300 mg total) by mouth 3 (three) times daily. Patient taking differently: Take 300 mg by mouth 2 (two) times daily.  02/01/16   Maren Reamer, MD  glucose blood test strip Use as instructed 02/01/16   Dawn  Lazarus Gowda, MD  ibuprofen (ADVIL,MOTRIN) 600 MG tablet Take 1 tablet (600 mg total) by mouth every 8 (eight) hours as needed. Patient taking differently: Take 600 mg by mouth every 8 (eight) hours as needed for headache or moderate pain.  12/14/15   Jola Schmidt, MD  Insulin Glargine (LANTUS SOLOSTAR) 100 UNIT/ML Solostar Pen Inject 45 Units into the skin daily at 10 pm. Discontinue previous dose 03/29/16   Argentina Donovan, PA-C  Insulin Pen Needle 29G X 5MM MISC 1 Dose by Does not apply route once. 03/29/16 03/29/16  Argentina Donovan, PA-C  Lancets (ACCU-CHEK SOFT TOUCH) lancets Use as instructed for once daily blood glucose testing 02/01/16   Maren Reamer, MD  metFORMIN (GLUCOPHAGE) 500 MG tablet Take 1 tablet (500 mg total) by mouth 2 (two) times daily with a meal. 03/29/16   Argentina Donovan, PA-C  mometasone-formoterol (DULERA) 100-5 MCG/ACT AERO Inhale 2 puffs into the lungs 2 (two) times daily. Patient not taking: Reported on 02/29/2016 02/01/16   Maren Reamer, MD  naproxen (NAPROSYN) 500 MG tablet Take 1 tablet (500 mg total) by mouth 2 (two) times daily. Patient taking differently: Take 500 mg by mouth 2 (two) times daily as needed for moderate pain or headache.  02/09/16   April Palumbo, MD  nystatin (MYCOSTATIN) 100000 UNIT/ML suspension Take 5 mLs (500,000 Units total) by mouth 4 (four) times daily. 03/24/16 03/29/16  Leo Grosser, MD  pantoprazole (PROTONIX) 40 MG tablet Take 1 tablet (40 mg total) by mouth daily. 03/29/16   Argentina Donovan, PA-C  sucralfate (CARAFATE) 1 GM/10ML suspension Take 10 mLs (1 g total) by mouth 4 (four) times daily -  with meals and at bedtime. Patient not taking: Reported on 02/29/2016 02/01/16   Maren Reamer, MD     Objective:  EXAM:   Vitals:   03/29/16 1422  BP: 128/85  Pulse: (!) 108  Resp: 16  Temp: 98.5 F (36.9 C)  TempSrc: Oral  SpO2: 95%  Weight: 245 lb 6.4 oz (111.3 kg)    General appearance : A&OX3. NAD. Non-toxic-appearing HEENT: Atraumatic and Normocephalic.  PERRLA. EOM intact.  TM clear B. Mouth-MMM, post pharynx WNL w/o erythema, No PND. Neck: supple, no JVD. No cervical lymphadenopathy. No thyromegaly Chest/Lungs:  Breathing-non-labored, Good air entry bilaterally, breath sounds normal without rales, rhonchi, or wheezing  CVS: S1 S2 regular, no murmurs, gallops, rubs.  Rate 88 at time of exam Extremities: Bilateral Lower Ext shows no edema, both legs are warm to touch with = pulse throughout Neurology:  CN II-XII grossly intact, Non  focal.   Psych:  TP linear. J/I poor regarding health. Normal speech. Appropriate eye contact and affect.  Skin:  No Rash  Data Review Lab Results  Component Value Date   HGBA1C 9.0 01/10/2016   HGBA1C 10.1 08/24/2015     Assessment & Plan   1. Type 2 diabetes mellitus with diabetic neuropathy, unspecified long term insulin use status (HCC) Medication Non-compliance.  We had a lengthy discussion about this.  Kept patient in office over 90 mins to observe blood sugar changes with insulin.  She is to resume her diabetic medications as directed today!!! - Pneumococcal polysaccharide vaccine 23-valent greater than or equal to 2yo subcutaneous/IM - Glucose (CBG) - Urinalysis Dipstick - insulin aspart (novoLOG) injection 20 Units; Inject 0.2 mLs (20 Units total) into the skin once.  Glucose went up in 40mns.  Novolog 10 units more administered. Cbg=412 initially then 534  62mns after 20 units novolog then 500 after 10 more units of Novolog. - Insulin Pen Needle 29G X 5MM MISC; 1 Dose by Does not apply route once.  Dispense: 1 each; Refill: 3 - Insulin Glargine (LANTUS SOLOSTAR) 100 UNIT/ML Solostar Pen; Inject 45 Units into the skin daily at 10 pm. Dispense: 15 mL; Refill: 3 - metFORMIN (GLUCOPHAGE) 500 MG tablet; Take 1 tablet (500 mg total) by mouth 2 (two) times daily with a meal.  Dispense: 180 tablet; Refill: 3 Check blood sugars tid and record.  Compliance imperative.  Drink lots of water.  Blood sugars should start trending downward immediately, if not RTC or to ED if higher or symptomatic.  2. Yeast infection - fluconazole (DIFLUCAN) 150 MG tablet; Take 1 tablet (150 mg total) by mouth once.  Dispense: 1 tablet; Refill: 0  3. Gastroesophageal reflux disease without esophagitis - Pneumococcal polysaccharide vaccine 23-valent greater than or equal to 2yo subcutaneous/IM - pantoprazole (PROTONIX) 40 MG tablet; Take 1 tablet (40 mg total) by mouth daily.  Dispense: 30 tablet; Refill:  2   Patient have been counseled extensively about nutrition and exercise  Return in about 1 month (around 04/29/2016) for Dr LJanne Napoleonfor DM management/check up.  The patient was given clear instructions to go to ER or return to medical center if symptoms don't improve, worsen or new problems develop. The patient verbalized understanding. The patient was told to call to get lab results if they haven't heard anything in the next week.     AFreeman Caldron PA-C [facility] [place]  03/29/2016, 35:17PMPatient ID: AMorley Kos female   DOB: 8May 07, 1982 35y.o.   MRN: [id number]

## 2016-05-03 ENCOUNTER — Emergency Department (HOSPITAL_COMMUNITY): Payer: Medicaid Other

## 2016-05-03 ENCOUNTER — Encounter (HOSPITAL_COMMUNITY): Payer: Self-pay

## 2016-05-03 ENCOUNTER — Emergency Department (HOSPITAL_COMMUNITY)
Admission: EM | Admit: 2016-05-03 | Discharge: 2016-05-03 | Disposition: A | Discharge status: Home / Self Care | Payer: Medicaid Other | Attending: Emergency Medicine | Admitting: Emergency Medicine

## 2016-05-03 DIAGNOSIS — A599 Trichomoniasis, unspecified: Secondary | ICD-10-CM

## 2016-05-03 DIAGNOSIS — M79641 Pain in right hand: Secondary | ICD-10-CM | POA: Diagnosis not present

## 2016-05-03 DIAGNOSIS — I10 Essential (primary) hypertension: Secondary | ICD-10-CM | POA: Insufficient documentation

## 2016-05-03 DIAGNOSIS — J45909 Unspecified asthma, uncomplicated: Secondary | ICD-10-CM | POA: Insufficient documentation

## 2016-05-03 DIAGNOSIS — Z87891 Personal history of nicotine dependence: Secondary | ICD-10-CM | POA: Diagnosis not present

## 2016-05-03 DIAGNOSIS — E1140 Type 2 diabetes mellitus with diabetic neuropathy, unspecified: Secondary | ICD-10-CM | POA: Insufficient documentation

## 2016-05-03 LAB — WET PREP, GENITAL
Sperm: NONE SEEN
YEAST WET PREP: NONE SEEN

## 2016-05-03 LAB — CBG MONITORING, ED: Glucose-Capillary: 346 mg/dL — ABNORMAL HIGH (ref 65–99)

## 2016-05-03 MED ORDER — CEFTRIAXONE SODIUM 250 MG IJ SOLR
250.0000 mg | Freq: Once | INTRAMUSCULAR | Status: AC
  Administered 2016-05-03: 250 mg via INTRAMUSCULAR
  Filled 2016-05-03: qty 250

## 2016-05-03 MED ORDER — METRONIDAZOLE 500 MG PO TABS
2000.0000 mg | ORAL_TABLET | Freq: Once | ORAL | Status: AC
  Administered 2016-05-03: 2000 mg via ORAL
  Filled 2016-05-03: qty 4

## 2016-05-03 MED ORDER — STERILE WATER FOR INJECTION IJ SOLN
INTRAMUSCULAR | Status: AC
  Administered 2016-05-03: 10 mL
  Filled 2016-05-03: qty 10

## 2016-05-03 MED ORDER — AZITHROMYCIN 600 MG PO TABS
1200.0000 mg | ORAL_TABLET | Freq: Once | ORAL | Status: AC
  Administered 2016-05-03: 1200 mg via ORAL
  Filled 2016-05-03: qty 2

## 2016-05-03 NOTE — Discharge Instructions (Signed)
You have been seen today in the Emergency Department (ED) for pelvic pain and discharge.  Your workup today shows that you had one or more sexually transmitted infections (STIs).  You have been treated with a one time dose medication for both of these conditions.  Please have your partner tested for STD's and do not resume sexual activity until you and your partner have confirmed negative test results or have both been treated.  Please follow up with your doctor as soon as possible regarding today's ED visit and your symptoms.   Return to the ED if your pain worsens, you develop a fever, or for any other symptoms that concern you.  

## 2016-05-03 NOTE — ED Triage Notes (Signed)
Pt with hand pain on right hand.  States there is a knot in hand.  Pt also had physical today scheduled but had to cancel d/t death in family.  Pt also having vaginal itching which started 3-4 days.  No Odor.
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
## 2016-05-04 LAB — GC/CHLAMYDIA PROBE AMP (~~LOC~~) NOT AT ARMC
Chlamydia: NEGATIVE
Neisseria Gonorrhea: NEGATIVE

## 2016-05-07 ENCOUNTER — Ambulatory Visit: Payer: Medicaid Other | Attending: Internal Medicine | Admitting: *Deleted

## 2016-05-07 DIAGNOSIS — Z3042 Encounter for surveillance of injectable contraceptive: Secondary | ICD-10-CM | POA: Diagnosis not present

## 2016-05-07 DIAGNOSIS — Z789 Other specified health status: Secondary | ICD-10-CM

## 2016-05-07 LAB — POCT URINE PREGNANCY: Preg Test, Ur: NEGATIVE

## 2016-05-07 MED ORDER — MEDROXYPROGESTERONE ACETATE 150 MG/ML IM SUSP
150.0000 mg | Freq: Once | INTRAMUSCULAR | Status: AC
  Administered 2016-05-07: 150 mg via INTRAMUSCULAR

## 2016-05-07 NOTE — Patient Instructions (Signed)
Next injection due:Jul 24, 2015 - August 04, 2016.

## 2016-05-07 NOTE — Progress Notes (Signed)
Pt received Depo- Provera in Rt. Upper outer quadrant. Urine HCG test negative.  Next injection due:Jul 24, 2015 - August 04, 2016, reminder appointment card given along with AVS.

## 2016-06-04 ENCOUNTER — Emergency Department (HOSPITAL_COMMUNITY)
Admission: EM | Admit: 2016-06-04 | Discharge: 2016-06-04 | Disposition: A | Discharge status: Home / Self Care | Payer: Medicaid Other | Attending: Emergency Medicine | Admitting: Emergency Medicine

## 2016-06-04 ENCOUNTER — Encounter (HOSPITAL_COMMUNITY): Payer: Self-pay | Admitting: Emergency Medicine

## 2016-06-04 DIAGNOSIS — N764 Abscess of vulva: Secondary | ICD-10-CM | POA: Insufficient documentation

## 2016-06-04 DIAGNOSIS — J45909 Unspecified asthma, uncomplicated: Secondary | ICD-10-CM | POA: Diagnosis not present

## 2016-06-04 DIAGNOSIS — L739 Follicular disorder, unspecified: Secondary | ICD-10-CM | POA: Diagnosis not present

## 2016-06-04 DIAGNOSIS — Z79899 Other long term (current) drug therapy: Secondary | ICD-10-CM | POA: Insufficient documentation

## 2016-06-04 DIAGNOSIS — L0291 Cutaneous abscess, unspecified: Secondary | ICD-10-CM

## 2016-06-04 DIAGNOSIS — Z87891 Personal history of nicotine dependence: Secondary | ICD-10-CM | POA: Insufficient documentation

## 2016-06-04 DIAGNOSIS — E1140 Type 2 diabetes mellitus with diabetic neuropathy, unspecified: Secondary | ICD-10-CM | POA: Diagnosis not present

## 2016-06-04 DIAGNOSIS — Z794 Long term (current) use of insulin: Secondary | ICD-10-CM | POA: Insufficient documentation

## 2016-06-04 DIAGNOSIS — I10 Essential (primary) hypertension: Secondary | ICD-10-CM | POA: Insufficient documentation

## 2016-06-04 LAB — CBG MONITORING, ED: Glucose-Capillary: 308 mg/dL — ABNORMAL HIGH (ref 65–99)

## 2016-06-04 MED ORDER — DOXYCYCLINE HYCLATE 100 MG PO CAPS: 100.0000 mg | ORAL_CAPSULE | Freq: Two times a day (BID) | ORAL | 0 refills | Status: DC

## 2016-06-04 NOTE — Discharge Instructions (Signed)
Please repeat the attached information on "skin abscesses" and "folliculitis".   Please avoid shaving as this can cause infection and inflammation of hair follicles but can lead to an abscess.  Your symptoms are more consistent with an infection of a hair follicle. The two knots that you feel are likely the beginning stages of 2 pus pockets or abscesses. Continue doing hot compresses, you may take Tylenol or ibuprofen for the pain. Avoid shaving that area. Try to keep that area dry, stop applying oils, moisturizers or diaper rash cream. You have been prescribed an antibiotic for your symptoms, please take this as prescribed.   You may need incision and drainage if the abscesses grow or become more painful.    Return to the emergency department if your symptoms worsen.

## 2016-06-04 NOTE — ED Triage Notes (Signed)
Pt reports having abscess to bilateral sides of labia that started last week after shaving.

## 2016-06-04 NOTE — ED Provider Notes (Signed)
[place] DEPT Provider Note   CSN: [id number] Arrival date & time: 06/04/16  0609     History   Chief Complaint Chief Complaint  Patient presents with  . Abscess    HPI Sarah Garrison is a 36 y.o. female presents with abscess cyst to bilateral sides of labia that she noticed last night associated with pain, swelling, redness. Patient states she shaved her vaginal area for the first time last week and noticed razor burn bumps, itching in her vaginal area a couple of days after shaving. Patient states she is having difficulty sitting and walking due to pain from abscesses. Patient denies fevers, urinary symptoms, vaginal discharge or pain. Patient is an insulin-dependent type 2 diabetic.  Patient denies previous history of abscesses or I&D's.  HPI  Past Medical History:  Diagnosis Date  . Asthma   . Diabetes mellitus without complication ([place])   . GERD (gastroesophageal reflux disease)   . Hypertension     Patient Active Problem List   Diagnosis Date Noted  . Uses depot medroxyprogesterone acetate as primary birth control method 05/07/2016  . Encounter for pregnancy test 02/01/2016  . Leg swelling 09/29/2015  . Morbid obesity ([place]) 09/29/2015  . DM neuropathy, type II diabetes mellitus ([place]) 08/24/2015    Past Surgical History:  Procedure Laterality Date  . CESAREAN SECTION    . TUBAL LIGATION    . WISDOM TOOTH EXTRACTION      OB History    Gravida Para Term Preterm AB Living   _0 SAB TAB Ectopic Multiple Live Births   1               Home Medications    Prior to Admission medications   Medication Sig Start Date End Date Taking? Authorizing Provider  ACCU-CHEK SOFTCLIX LANCETS lancets USE AS INSTRUCTED 10/14/15   Arnoldo Morale, MD  albuterol (PROVENTIL HFA;VENTOLIN HFA) 108 (90 Base) MCG/ACT inhaler Inhale 2 puffs into the lungs every 6 (six) hours as needed for wheezing or shortness of breath. Reported on 09/29/2015 02/29/16   Daryl F de Villier  II, PA  amLODipine (NORVASC) 2.5 MG tablet Take 1 tablet (2.5 mg total) by mouth daily. Reported on 09/29/2015 02/01/16   Maren Reamer, MD  b complex vitamins capsule Take 1 capsule by mouth daily.    Historical Provider, MD  Blood Glucose Monitoring Suppl (ACCU-CHEK AVIVA PLUS) w/Device KIT 1 each by Does not apply route 3 (three) times daily. 09/13/15   Tresa Garter, MD  butalbital-acetaminophen-caffeine (FIORICET, ESGIC) 50-325-40 MG tablet Take 1 tablet by mouth 2 (two) times daily as needed for headache. Patient not taking: Reported on 02/29/2016 09/07/15   Tresa Garter, MD  cyclobenzaprine (FLEXERIL) 10 MG tablet Take 1 tablet (10 mg total) by mouth 3 (three) times daily as needed for muscle spasms. Patient not taking: Reported on 02/29/2016 12/14/15   Jola Schmidt, MD  doxycycline (VIBRAMYCIN) 100 MG capsule Take 1 capsule (100 mg total) by mouth 2 (two) times daily. 06/04/16   Kinnie Feil, PA-C  gabapentin (NEURONTIN) 300 MG capsule Take 1 capsule (300 mg total) by mouth 3 (three) times daily. Patient taking differently: Take 300 mg by mouth 2 (two) times daily.  02/01/16   Maren Reamer, MD  glucose blood test strip Use as instructed 02/01/16   Maren Reamer, MD  ibuprofen (ADVIL,MOTRIN) 600 MG tablet Take 1 tablet (600 mg total) by mouth every  8 (eight) hours as needed. Patient taking differently: Take 600 mg by mouth every 8 (eight) hours as needed for headache or moderate pain.  12/14/15   Jola Schmidt, MD  Insulin Glargine (LANTUS SOLOSTAR) 100 UNIT/ML Solostar Pen Inject 45 Units into the skin daily at 10 pm. Discontinue previous dose 03/29/16   Argentina Donovan, PA-C  Lancets (ACCU-CHEK SOFT TOUCH) lancets Use as instructed for once daily blood glucose testing 02/01/16   Maren Reamer, MD  metFORMIN (GLUCOPHAGE) 500 MG tablet Take 1 tablet (500 mg total) by mouth 2 (two) times daily with a meal. 03/29/16   Argentina Donovan, PA-C  mometasone-formoterol (DULERA) 100-5  MCG/ACT AERO Inhale 2 puffs into the lungs 2 (two) times daily. Patient not taking: Reported on 02/29/2016 02/01/16   Maren Reamer, MD  naproxen (NAPROSYN) 500 MG tablet Take 1 tablet (500 mg total) by mouth 2 (two) times daily. Patient taking differently: Take 500 mg by mouth 2 (two) times daily as needed for moderate pain or headache.  02/09/16   April Palumbo, MD  pantoprazole (PROTONIX) 40 MG tablet Take 1 tablet (40 mg total) by mouth daily. 03/29/16   Argentina Donovan, PA-C  sucralfate (CARAFATE) 1 GM/10ML suspension Take 10 mLs (1 g total) by mouth 4 (four) times daily -  with meals and at bedtime. Patient not taking: Reported on 02/29/2016 02/01/16   Maren Reamer, MD    Family History Family History  Problem Relation Age of Onset  . Asthma Mother   . Asthma Father     Social History Social History  Substance Use Topics  . Smoking status: Former Smoker    Packs/day: 0.00    Types: Cigarettes    Quit date: 11/02/2013  . Smokeless tobacco: Never Used  . Alcohol use No     Allergies   Morphine and related; Metformin and related; and Glyburide   Review of Systems Review of Systems  Constitutional: Negative for appetite change, chills and fever.  HENT: Negative for congestion and sore throat.   Eyes: Negative for visual disturbance.  Respiratory: Negative for cough, choking, chest tightness and shortness of breath.   Cardiovascular: Negative for chest pain, palpitations and leg swelling.  Gastrointestinal: Negative for abdominal pain, constipation, diarrhea, nausea and vomiting.  Genitourinary: Negative for difficulty urinating and hematuria.  Musculoskeletal: Negative for arthralgias.  Skin: Negative for rash and wound.       Abscesses L and R of labia.  Neurological: Negative for dizziness, seizures, syncope, weakness, light-headedness, numbness and headaches.  Hematological: Does not bruise/bleed easily.  Psychiatric/Behavioral: Negative.      Physical  Exam Updated Vital Signs BP 127/90   Pulse 97   Temp 98.1 F (36.7 C) (Oral)   Resp 18   Ht '5\' 4"'$  (1.626 m)   Wt 105.3 kg   SpO2 97%   BMI 39.84 kg/m   Physical Exam  Constitutional: She is oriented to person, place, and time. She appears well-developed and well-nourished. No distress.  HENT:  Head: Normocephalic and atraumatic.  Nose: Nose normal.  Mouth/Throat: Oropharynx is clear and moist. No oropharyngeal exudate.  Eyes: Conjunctivae and EOM are normal. Pupils are equal, round, and reactive to light.  Neck: Normal range of motion. Neck supple. No JVD present.  Cardiovascular: Normal rate, regular rhythm, normal heart sounds and intact distal pulses.   No murmur heard. Pulmonary/Chest: Effort normal and breath sounds normal. No respiratory distress. She has no wheezes. She has no rales. She  exhibits no tenderness.  Abdominal: Soft. Bowel sounds are normal. She exhibits no distension and no mass. There is no tenderness. There is no rebound and no guarding.  Musculoskeletal: Normal range of motion. She exhibits no deformity.  Lymphadenopathy:    She has no cervical adenopathy.  Neurological: She is alert and oriented to person, place, and time. No sensory deficit.  Skin: Skin is warm and dry. Capillary refill takes less than 2 seconds.  Two small (<1cm) areas of fluctuance with tenderness on bilateral aspects of labia in groin crease without redness, overlying cellulitis.  Multiple erythematous papules throughout pubic area.  Psychiatric: She has a normal mood and affect. Her behavior is normal. Judgment and thought content normal.  Nursing note and vitals reviewed.    ED Treatments / Results  Labs (all labs ordered are listed, but only abnormal results are displayed) Labs Reviewed  CBG MONITORING, ED - Abnormal; Notable for the following:       Result Value   Glucose-Capillary 308 (*)    All other components within normal limits    EKG  EKG Interpretation None        Radiology No results found.  Procedures Procedures (including critical care time)  Medications Ordered in ED Medications - No data to display   Initial Impression / Assessment and Plan / ED Course  I have reviewed the triage vital signs and the nursing notes.  Pertinent labs & imaging results that were available during my care of the patient were reviewed by me and considered in my medical decision making (see chart for details).  Clinical Course    There are 2 small areas of fluctuance and tenderness in bilateral aspects of the labia with minimal redness and without signs of overlying cellulitis. I suspect early stages of abscesses at this point I do not think an I&D would produce much discharge. Patient noticed abscesses last night, she has been soaking in Epsom salts. I don't think an I&D would provide much benefit at this time. Patient is afebrile. Patient will be discharged with analgesics, doxycycline, hot compresses.  ED return instructions given, pt agreeable to dispo plan.   Final Clinical Impressions(s) / ED Diagnoses   Final diagnoses:  Folliculitis  Abscess    New Prescriptions Discharge Medication List as of 06/04/2016 10:46 AM    START taking these medications   Details  doxycycline (VIBRAMYCIN) 100 MG capsule Take 1 capsule (100 mg total) by mouth 2 (two) times daily., Starting Mon 06/04/2016, Print         Kinnie Feil, PA-C 06/04/16 1207    Daleen Bo, MD 06/04/16 [phone]

## 2016-06-11 ENCOUNTER — Emergency Department (HOSPITAL_COMMUNITY): Payer: Medicaid Other

## 2016-06-11 ENCOUNTER — Encounter (HOSPITAL_COMMUNITY): Payer: Self-pay

## 2016-06-11 DIAGNOSIS — Z87891 Personal history of nicotine dependence: Secondary | ICD-10-CM | POA: Insufficient documentation

## 2016-06-11 DIAGNOSIS — J45909 Unspecified asthma, uncomplicated: Secondary | ICD-10-CM | POA: Diagnosis not present

## 2016-06-11 DIAGNOSIS — E1165 Type 2 diabetes mellitus with hyperglycemia: Secondary | ICD-10-CM | POA: Insufficient documentation

## 2016-06-11 DIAGNOSIS — R0789 Other chest pain: Secondary | ICD-10-CM | POA: Insufficient documentation

## 2016-06-11 DIAGNOSIS — I10 Essential (primary) hypertension: Secondary | ICD-10-CM | POA: Diagnosis not present

## 2016-06-11 DIAGNOSIS — L739 Follicular disorder, unspecified: Secondary | ICD-10-CM | POA: Diagnosis not present

## 2016-06-11 DIAGNOSIS — E1140 Type 2 diabetes mellitus with diabetic neuropathy, unspecified: Secondary | ICD-10-CM | POA: Diagnosis not present

## 2016-06-11 DIAGNOSIS — Z794 Long term (current) use of insulin: Secondary | ICD-10-CM | POA: Insufficient documentation

## 2016-06-11 DIAGNOSIS — Z79899 Other long term (current) drug therapy: Secondary | ICD-10-CM | POA: Diagnosis not present

## 2016-06-11 DIAGNOSIS — R079 Chest pain, unspecified: Secondary | ICD-10-CM | POA: Diagnosis present

## 2016-06-11 LAB — COMPREHENSIVE METABOLIC PANEL
ALBUMIN: 4.1 g/dL (ref 3.5–5.0)
ALK PHOS: 122 U/L (ref 38–126)
ALT: 17 U/L (ref 14–54)
ANION GAP: 9 (ref 5–15)
AST: 14 U/L — AB (ref 15–41)
BUN: 14 mg/dL (ref 6–20)
CO2: 22 mmol/L (ref 22–32)
Calcium: 9.3 mg/dL (ref 8.9–10.3)
Chloride: 102 mmol/L (ref 101–111)
Creatinine, Ser: 0.77 mg/dL (ref 0.44–1.00)
GFR calc Af Amer: >60 mL/min (ref >60)
GFR calc non Af Amer: >60 mL/min (ref >60)
GLUCOSE: 342 mg/dL — AB (ref 65–99)
Potassium: 4.6 mmol/L (ref 3.5–5.1)
SODIUM: 133 mmol/L — AB (ref 135–145)
Total Bilirubin: 0.5 mg/dL (ref 0.3–1.2)
Total Protein: 7.5 g/dL (ref 6.5–8.1)

## 2016-06-11 LAB — URINALYSIS, ROUTINE W REFLEX MICROSCOPIC
BACTERIA UA: NONE SEEN
BILIRUBIN URINE: NEGATIVE
Glucose, UA: >=500 mg/dL — AB
Hgb urine dipstick: NEGATIVE
KETONES UR: NEGATIVE mg/dL
Leukocytes, UA: NEGATIVE
NITRITE: NEGATIVE
Protein, ur: NEGATIVE mg/dL
SPECIFIC GRAVITY, URINE: 1.035 — AB (ref 1.005–1.030)
pH: 6 (ref 5.0–8.0)

## 2016-06-11 LAB — CBC
HEMATOCRIT: 41.4 % (ref 36.0–46.0)
Hemoglobin: 14.3 g/dL (ref 12.0–15.0)
MCH: 28.5 pg (ref 26.0–34.0)
MCHC: 34.5 g/dL (ref 30.0–36.0)
MCV: 82.6 fL (ref 78.0–100.0)
Platelets: 331 10*3/uL (ref 150–400)
RBC: 5.01 MIL/uL (ref 3.87–5.11)
RDW: 12.2 % (ref 11.5–15.5)
WBC: 7.5 10*3/uL (ref 4.0–10.5)

## 2016-06-11 LAB — I-STAT TROPONIN, ED: Troponin i, poc: 0 ng/mL (ref 0.00–0.08)

## 2016-06-11 LAB — CBG MONITORING, ED: GLUCOSE-CAPILLARY: 400 mg/dL — AB (ref 65–99)

## 2016-06-11 LAB — I-STAT BETA HCG BLOOD, ED (MC, WL, AP ONLY): I-stat hCG, quantitative: <5 m[IU]/mL (ref <5)

## 2016-06-11 NOTE — ED Triage Notes (Signed)
PT C/O RIGHT-SIDED CHEST PAIN RADIATING TO THE RIGHT ARM SINCE THIS MORNING. DENIES INJURY, N/V. PT ALSO C/O A LABIAL ABSCESS TO THE LEFT SIDE SINCE 06/04/16. PT WAS SEEN HERE AND GIVEN RX FOR DOXYCYCLINE, IN WHICH SHE COULDN'T FILL. SHE STS SHE WAS TOLD TO COME BACK TO HAVE IT LANCED ONCE IT CAME TO A HEAD. SHE ALSO STS THAT HER BLOOD SUGARS HAVE BEEN VERY HIGH, AT HOME IT IN THE 500'S.

## 2016-06-12 ENCOUNTER — Emergency Department (HOSPITAL_COMMUNITY)
Admission: EM | Admit: 2016-06-12 | Discharge: 2016-06-12 | Disposition: A | Discharge status: Home / Self Care | Payer: Medicaid Other | Attending: Emergency Medicine | Admitting: Emergency Medicine

## 2016-06-12 DIAGNOSIS — L739 Follicular disorder, unspecified: Secondary | ICD-10-CM

## 2016-06-12 DIAGNOSIS — R739 Hyperglycemia, unspecified: Secondary | ICD-10-CM

## 2016-06-12 DIAGNOSIS — R0789 Other chest pain: Secondary | ICD-10-CM

## 2016-06-12 DIAGNOSIS — E1140 Type 2 diabetes mellitus with diabetic neuropathy, unspecified: Secondary | ICD-10-CM

## 2016-06-12 LAB — D-DIMER, QUANTITATIVE (NOT AT ARMC)

## 2016-06-12 MED ORDER — INSULIN GLARGINE 100 UNIT/ML SOLOSTAR PEN: 45.0000 [IU] | PEN_INJECTOR | Freq: Every day | SUBCUTANEOUS | 2 refills | Status: DC

## 2016-06-12 MED ORDER — HYDROCODONE-ACETAMINOPHEN 5-325 MG PO TABS
1.0000 | ORAL_TABLET | Freq: Once | ORAL | Status: AC
  Administered 2016-06-12: 1 via ORAL
  Filled 2016-06-12: qty 1

## 2016-06-12 MED ORDER — KETOROLAC TROMETHAMINE 30 MG/ML IJ SOLN
15.0000 mg | Freq: Once | INTRAMUSCULAR | Status: AC
Start: 2016-06-12 — End: 2016-06-12
  Administered 2016-06-12: 15 mg via INTRAVENOUS
  Filled 2016-06-12: qty 1

## 2016-06-12 MED ORDER — INSULIN GLARGINE 100 UNIT/ML ~~LOC~~ SOLN
45.0000 [IU] | Freq: Every day | SUBCUTANEOUS | Status: DC
  Administered 2016-06-12: 45 [IU] via SUBCUTANEOUS
  Filled 2016-06-12: qty 0.45

## 2016-06-12 MED ORDER — SODIUM CHLORIDE 0.9 % IV BOLUS (SEPSIS)
1000.0000 mL | Freq: Once | INTRAVENOUS | Status: AC
  Administered 2016-06-12: 1000 mL via INTRAVENOUS

## 2016-06-12 MED ORDER — CHLORHEXIDINE GLUCONATE 4 % EX LIQD: Freq: Every day | CUTANEOUS | 0 refills | Status: DC | PRN

## 2016-06-12 NOTE — ED Provider Notes (Signed)
[facility] DEPT Provider Note   CSN: [id number] Arrival date & time: 06/11/16  1554  By signing my name below, I, Dolores Hoose, attest that this documentation has been prepared under the direction and in the presence of Merryl Hacker, MD . Electronically Signed: Dolores Hoose, Scribe. 06/12/2016. 1:26 AM.  History   Chief Complaint Chief Complaint  Patient presents with  . Chest Pain  . Abscess   The history is provided by the patient. No language interpreter was used.    HPI Comments:  Sarah Garrison is a 36 y.o. female with pmhx of DM and HTN who presents to the Emergency Department complaining of sudden-onset, constant right-sided chest pain beginning 12 hours ago. She describes her symptoms as an 8/10 pain radiating to her shoulder and neck and exacerbated by movement. She reports associated fever. Pt denies any recent heavy lifting. She is noncompliant with her daily diabetes medication due to being out of needles.   Pt secondarily complains of a gradual-onset, worsening area of redness and erythema to the area between her legs. She notes she was seen previously for her symptoms, told she had an abscess and given doxycycline, but due to insurance difficulties has been unable to fill her prescription.   Past Medical History:  Diagnosis Date  . Asthma   . Diabetes mellitus without complication ([place])   . GERD (gastroesophageal reflux disease)   . Hypertension     Patient Active Problem List   Diagnosis Date Noted  . Uses depot medroxyprogesterone acetate as primary birth control method 05/07/2016  . Encounter for pregnancy test 02/01/2016  . Leg swelling 09/29/2015  . Morbid obesity ([place]) 09/29/2015  . 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 CESAREAN SECTION    . TUBAL LIGATION    . WISDOM TOOTH EXTRACTION      OB History    Gravida Para Term Preterm AB Living   5 4     1 4    SAB TAB  Ectopic Multiple Live Births   1               Home Medications    Prior to Admission medications   Medication Sig Start Date End Date Taking? Authorizing Provider  albuterol (PROVENTIL HFA;VENTOLIN HFA) 108 (90 Base) MCG/ACT inhaler Inhale 2 puffs into the lungs every 6 (six) hours as needed for wheezing or shortness of breath. Reported on 09/29/2015 02/29/16  Yes Daryl F de Villier II, PA  b complex vitamins capsule Take 1 capsule by mouth daily.   Yes Historical Provider, MD  metFORMIN (GLUCOPHAGE) 500 MG tablet Take 1 tablet (500 mg total) by mouth 2 (two) times daily with a meal. 03/29/16  Yes Dionne Bucy McClung, PA-C  naproxen (NAPROSYN) 500 MG tablet Take 1 tablet (500 mg total) by mouth 2 (two) times daily. Patient taking differently: Take 500 mg by mouth 2 (two) times daily as needed for moderate pain or headache.  02/09/16  Yes April Palumbo, MD  pantoprazole (PROTONIX) 40 MG tablet Take 1 tablet (40 mg total) by mouth daily. 03/29/16  Yes Dionne Bucy McClung, PA-C  ACCU-CHEK SOFTCLIX LANCETS lancets USE AS INSTRUCTED 10/14/15   Arnoldo Morale, MD  amLODipine (NORVASC) 2.5 MG tablet Take 1 tablet (2.5 mg total) by mouth daily. Reported on 09/29/2015 Patient not taking: Reported on 06/12/2016 02/01/16   Maren Reamer, MD  Blood Glucose Monitoring Suppl (ACCU-CHEK AVIVA PLUS) w/Device  KIT 1 each by Does not apply route 3 (three) times daily. 09/13/15   Tresa Garter, MD  butalbital-acetaminophen-caffeine (FIORICET, ESGIC) 50-325-40 MG tablet Take 1 tablet by mouth 2 (two) times daily as needed for headache. Patient not taking: Reported on 02/29/2016 09/07/15   Tresa Garter, MD  chlorhexidine (HIBICLENS) 4 % external liquid Apply topically daily as needed. 06/12/16   Merryl Hacker, MD  cyclobenzaprine (FLEXERIL) 10 MG tablet Take 1 tablet (10 mg total) by mouth 3 (three) times daily as needed for muscle spasms. Patient not taking: Reported on 02/29/2016 12/14/15   Jola Schmidt, MD    doxycycline (VIBRAMYCIN) 100 MG capsule Take 1 capsule (100 mg total) by mouth 2 (two) times daily. 06/04/16   Kinnie Feil, PA-C  gabapentin (NEURONTIN) 300 MG capsule Take 1 capsule (300 mg total) by mouth 3 (three) times daily. Patient not taking: Reported on 06/12/2016 02/01/16   Maren Reamer, MD  glucose blood test strip Use as instructed 02/01/16   Maren Reamer, MD  ibuprofen (ADVIL,MOTRIN) 600 MG tablet Take 1 tablet (600 mg total) by mouth every 8 (eight) hours as needed. Patient not taking: Reported on 06/12/2016 12/14/15   Jola Schmidt, MD  Insulin Glargine (LANTUS SOLOSTAR) 100 UNIT/ML Solostar Pen Inject 45 Units into the skin daily at 10 pm. Discontinue previous dose 06/12/16   Merryl Hacker, MD  Lancets (ACCU-CHEK SOFT TOUCH) lancets Use as instructed for once daily blood glucose testing 02/01/16   Maren Reamer, MD  mometasone-formoterol (DULERA) 100-5 MCG/ACT AERO Inhale 2 puffs into the lungs 2 (two) times daily. Patient not taking: Reported on 02/29/2016 02/01/16   Maren Reamer, MD  sucralfate (CARAFATE) 1 GM/10ML suspension Take 10 mLs (1 g total) by mouth 4 (four) times daily -  with meals and at bedtime. Patient not taking: Reported on 02/29/2016 02/01/16   Maren Reamer, MD    Family History Family History  Problem Relation Age of Onset  . Asthma Mother   . Asthma Father     Social History Social History  Substance Use Topics  . Smoking status: Former Smoker    Packs/day: 0.00    Types: Cigarettes    Quit date: 11/02/2013  . Smokeless tobacco: Never Used  . Alcohol use No     Allergies   Morphine and related; Metformin and related; and Glyburide   Review of Systems Review of Systems  Constitutional: Negative for fever.  Respiratory: Negative for cough and shortness of breath.   Cardiovascular: Positive for chest pain.  Gastrointestinal: Negative for abdominal pain, nausea and vomiting.  Skin: Wound: abscess.  All other systems reviewed and  are negative.    Physical Exam Updated Vital Signs BP 128/98 (BP Location: Left Arm)   Pulse 95   Temp 98 F (36.7 C) (Oral)   Resp 20   Ht 5' 5"  (1.651 m)   Wt 232 lb (105.2 kg)   SpO2 99%   BMI 38.61 kg/m   Physical Exam  Constitutional: She is oriented to person, place, and time. She appears well-developed and well-nourished.  Morbidly obese  HENT:  Head: Normocephalic and atraumatic.  Cardiovascular: Normal rate, regular rhythm and normal heart sounds.   No murmur heard. Pulmonary/Chest: Effort normal and breath sounds normal. No respiratory distress. She has no wheezes. She exhibits tenderness.  Tenderness to palpation right chest wall  Abdominal: Soft. Bowel sounds are normal. There is no tenderness. There is no guarding.  Genitourinary:  Genitourinary Comments: External vaginal exam reveals multiple small areas of folliculitis, there is one area of induration less than 1 cm, no obvious fluctuance, no overlying erythema  Neurological: She is alert and oriented to person, place, and time.  Skin: Skin is warm and dry.  Psychiatric: She has a normal mood and affect.  Nursing note and vitals reviewed.    ED Treatments / Results  DIAGNOSTIC STUDIES:  Oxygen Saturation is 100% on RA, normal by my interpretation.    COORDINATION OF CARE:  1:26 AM Discussed treatment plan with pt at bedside which includes blood work and pt agreed to plan.  Labs (all labs ordered are listed, but only abnormal results are displayed) Labs Reviewed  URINALYSIS, ROUTINE W REFLEX MICROSCOPIC - Abnormal; Notable for the following:       Result Value   Color, Urine STRAW (*)    Specific Gravity, Urine 1.035 (*)    Glucose, UA >=500 (*)    Squamous Epithelial / LPF 0-5 (*)    All other components within normal limits  COMPREHENSIVE METABOLIC PANEL - Abnormal; Notable for the following:    Sodium 133 (*)    Glucose, Bld 342 (*)    AST 14 (*)    All other components within normal limits    CBG MONITORING, ED - Abnormal; Notable for the following:    Glucose-Capillary 400 (*)    All other components within normal limits  CBC  D-DIMER, QUANTITATIVE (NOT AT [facility])  I-STAT TROPOININ, ED  CBG MONITORING, ED  I-STAT BETA HCG BLOOD, ED ([facility], [facility], [facility] ONLY)    EKG  EKG Interpretation  Date/Time:  Monday June 11 2016 16:57:25 EST Ventricular Rate:  110 PR Interval:    QRS Duration: 85 QT Interval:  322 QTC Calculation: 436 R Axis:   106 Text Interpretation:  Sinus tachycardia Borderline right axis deviation Baseline wander in lead(s) I aVR aVL V1 V2 V3 V5 V6 since last tracing no significant change Confirmed by BELFI  MD, MELANIE ([id number]) on 06/11/2016 5:27:21 PM       Radiology Dg Chest 2 View  Result Date: 06/11/2016 CLINICAL DATA:  RIGHT-sided chest pain radiating to RIGHT arm and neck since 0130 hours, rounding in chest, history asthma, hypertension, diabetes mellitus, GERD, former smoker EXAM: CHEST  2 VIEW COMPARISON:  02/29/2016 FINDINGS: Upper-normal size of cardiac silhouette. Mediastinal contours and pulmonary vascularity normal. Minimal chronic peribronchial thickening. No pulmonary infiltrate, pleural effusion or pneumothorax. Bones unremarkable. IMPRESSION: No acute abnormalities. Electronically Signed   By: Lavonia Dana M.D.   On: 06/11/2016 17:24    Procedures Procedures (including critical care time)  Medications Ordered in ED Medications  insulin glargine (LANTUS) injection 45 Units (45 Units Subcutaneous Given 06/12/16 0236)  sodium chloride 0.9 % bolus 1,000 mL (0 mLs Intravenous Stopped 06/12/16 0329)  ketorolac (TORADOL) 30 MG/ML injection 15 mg (15 mg Intravenous Given 06/12/16 0236)  HYDROcodone-acetaminophen (NORCO/VICODIN) 5-325 MG per tablet 1 tablet (1 tablet Oral Given 06/12/16 0332)     Initial Impression / Assessment and Plan / ED Course  I have reviewed the triage vital signs and the nursing notes.  Pertinent labs & imaging results that were  available during my care of the patient were reviewed by me and considered in my medical decision making (see chart for details).  Clinical Course     Patient presents with several complaints. Regarding chest pain, there is reproducible pain on exam; however, she is tachycardic. EKG and troponin negative. D-dimer negative.  Suspect chest wall pain. Patient has evidence of folliculitis likely secondary to shaving. No drainable abscess. Continue sitz baths. Hibiclens when necessary. No indication for antibiotics at this time. She was also noted to be hyperglycemic. She reports that she's not taken her nightly Lantus 2 days secondary to being out of needles. She was given her daily dose of Lantus. Will discharge with prescription for more.  After history, exam, and medical workup I feel the patient has been appropriately medically screened and is safe for discharge home. Pertinent diagnoses were discussed with the patient. Patient was given return precautions.   Final Clinical Impressions(s) / ED Diagnoses   Final diagnoses:  Atypical chest pain  Hyperglycemia  Folliculitis    New Prescriptions New Prescriptions   CHLORHEXIDINE (HIBICLENS) 4 % EXTERNAL LIQUID    Apply topically daily as needed.   I personally performed the services described in this documentation, which was scribed in my presence. The recorded information has been reviewed and is accurate.    Merryl Hacker, MD 06/12/16 [phone]

## 2016-06-12 NOTE — Discharge Instructions (Signed)
You were seen today for several complaints. Regarding her chest pain, your workup is reassuring. This may be related to musculoskeletal pain. Take naproxen twice daily. You're also notably hyperglycemic. It is very important that you take your Lantus. He also folliculitis in your groin area. Avoid shaving. Hibiclens. If you develop erythema, warmth, worsening pain you need to be reevaluated.

## 2016-07-14 ENCOUNTER — Encounter (HOSPITAL_COMMUNITY): Payer: Self-pay | Admitting: Emergency Medicine

## 2016-07-14 ENCOUNTER — Emergency Department (HOSPITAL_COMMUNITY): Payer: Medicaid Other

## 2016-07-14 ENCOUNTER — Emergency Department (HOSPITAL_COMMUNITY)
Admission: EM | Admit: 2016-07-14 | Discharge: 2016-07-15 | Disposition: A | Discharge status: Home / Self Care | Payer: Medicaid Other | Attending: Emergency Medicine | Admitting: Emergency Medicine

## 2016-07-14 DIAGNOSIS — R739 Hyperglycemia, unspecified: Secondary | ICD-10-CM

## 2016-07-14 DIAGNOSIS — J45909 Unspecified asthma, uncomplicated: Secondary | ICD-10-CM | POA: Diagnosis not present

## 2016-07-14 DIAGNOSIS — Z794 Long term (current) use of insulin: Secondary | ICD-10-CM | POA: Insufficient documentation

## 2016-07-14 DIAGNOSIS — H6693 Otitis media, unspecified, bilateral: Secondary | ICD-10-CM | POA: Insufficient documentation

## 2016-07-14 DIAGNOSIS — H6690 Otitis media, unspecified, unspecified ear: Secondary | ICD-10-CM

## 2016-07-14 DIAGNOSIS — E1140 Type 2 diabetes mellitus with diabetic neuropathy, unspecified: Secondary | ICD-10-CM | POA: Diagnosis not present

## 2016-07-14 DIAGNOSIS — Z87891 Personal history of nicotine dependence: Secondary | ICD-10-CM | POA: Diagnosis not present

## 2016-07-14 DIAGNOSIS — Z79899 Other long term (current) drug therapy: Secondary | ICD-10-CM | POA: Insufficient documentation

## 2016-07-14 DIAGNOSIS — I10 Essential (primary) hypertension: Secondary | ICD-10-CM | POA: Insufficient documentation

## 2016-07-14 DIAGNOSIS — R059 Cough, unspecified: Secondary | ICD-10-CM

## 2016-07-14 DIAGNOSIS — R05 Cough: Secondary | ICD-10-CM | POA: Diagnosis not present

## 2016-07-14 DIAGNOSIS — E1165 Type 2 diabetes mellitus with hyperglycemia: Secondary | ICD-10-CM | POA: Insufficient documentation

## 2016-07-14 DIAGNOSIS — H9201 Otalgia, right ear: Secondary | ICD-10-CM | POA: Diagnosis present

## 2016-07-14 LAB — BASIC METABOLIC PANEL
Anion gap: 8 (ref 5–15)
BUN: 9 mg/dL (ref 6–20)
CO2: 24 mmol/L (ref 22–32)
CREATININE: 0.78 mg/dL (ref 0.44–1.00)
Calcium: 9.2 mg/dL (ref 8.9–10.3)
Chloride: 102 mmol/L (ref 101–111)
GFR calc Af Amer: >60 mL/min (ref >60)
GLUCOSE: 422 mg/dL — AB (ref 65–99)
POTASSIUM: 3.7 mmol/L (ref 3.5–5.1)
SODIUM: 134 mmol/L — AB (ref 135–145)

## 2016-07-14 LAB — CBG MONITORING, ED
GLUCOSE-CAPILLARY: 297 mg/dL — AB (ref 65–99)
GLUCOSE-CAPILLARY: 400 mg/dL — AB (ref 65–99)
Glucose-Capillary: 313 mg/dL — ABNORMAL HIGH (ref 65–99)

## 2016-07-14 LAB — URINALYSIS, ROUTINE W REFLEX MICROSCOPIC
BACTERIA UA: NONE SEEN
Bilirubin Urine: NEGATIVE
Ketones, ur: NEGATIVE mg/dL
Nitrite: NEGATIVE
Protein, ur: NEGATIVE mg/dL
SPECIFIC GRAVITY, URINE: 1.043 — AB (ref 1.005–1.030)
pH: 5 (ref 5.0–8.0)

## 2016-07-14 LAB — CBC
HEMATOCRIT: 40.6 % (ref 36.0–46.0)
Hemoglobin: 13.9 g/dL (ref 12.0–15.0)
MCH: 29.1 pg (ref 26.0–34.0)
MCHC: 34.2 g/dL (ref 30.0–36.0)
MCV: 84.9 fL (ref 78.0–100.0)
PLATELETS: 308 10*3/uL (ref 150–400)
RBC: 4.78 MIL/uL (ref 3.87–5.11)
RDW: 12.3 % (ref 11.5–15.5)
WBC: 7.9 10*3/uL (ref 4.0–10.5)

## 2016-07-14 MED ORDER — SODIUM CHLORIDE 0.9 % IV BOLUS (SEPSIS)
1000.0000 mL | Freq: Once | INTRAVENOUS | Status: AC
Start: 2016-07-14 — End: 2016-07-14
  Administered 2016-07-14: 1000 mL via INTRAVENOUS

## 2016-07-14 MED ORDER — INSULIN GLARGINE 100 UNIT/ML ~~LOC~~ SOLN
45.0000 [IU] | Freq: Once | SUBCUTANEOUS | Status: AC
  Administered 2016-07-14: 45 [IU] via SUBCUTANEOUS
  Filled 2016-07-14: qty 0.45

## 2016-07-14 NOTE — ED Provider Notes (Signed)
[place] DEPT Provider Note   CSN: [id number] Arrival date & time: 07/14/16  1812  By signing my name below, I, Sarah Garrison, attest that this documentation has been prepared under the direction and in the presence of [facility], PA-C. Electronically Signed: Dora Garrison, Scribe. 07/14/2016. 10:30 PM.  History   Chief Complaint Chief Complaint  Patient presents with  . Hyperglycemia  . Generalized Body Aches    The history is provided by the patient. No language interpreter was used.     HPI Comments: Sarah Garrison is a 36 y.o. female with PMHx significant for asthma, DM, and HTN who presents to the Emergency Department complaining of constant, gradually worsening generalized body aches beginning yesterday. She reports an associated headache. Pt states she started experiencing eye watering, right ear pain, nasal congestion, nausea, mild SOB, and a persistent cough this morning. She has tried Tylenol, ibuprofen, and naproxen with mild improvement of her body aches but no improvement of her other symptoms. Pt has one known sick contact at work with the flu. Pt reports a h/o ear infections "every year" which she typically receives antibiotics to treat. She is a poorly controlled diabetic on insulin only and states she has not had her insulin (45 unit daily at night) for one week; she is uninsured and states she cannot currently afford it. She has a rx at the pharmacy but states it is $274 and she does not have the money to pay for this right now. She has applied for medicaid and is hopeful this will go through soon to help with cost. Pt reports that her glucose level has regularly been measured between 300-550 for the last month - glucometer at home confirms this. She denies abdominal pain, vomiting, diarrhea, fever.   Past Medical History:  Diagnosis Date  . Asthma   . Diabetes mellitus without complication ([place])   . GERD (gastroesophageal reflux disease)   . Hypertension      Patient Active Problem List   Diagnosis Date Noted  . Uses depot medroxyprogesterone acetate as primary birth control method 05/07/2016  . Encounter for pregnancy test 02/01/2016  . Leg swelling 09/29/2015  . Morbid obesity ([place]) 09/29/2015  . DM neuropathy, type II diabetes mellitus ([place]) 08/24/2015    Past Surgical History:  Procedure Laterality Date  . CESAREAN SECTION    . TUBAL LIGATION    . WISDOM TOOTH EXTRACTION      OB History    Gravida Para Term Preterm AB Living   5 4     1 4    SAB TAB Ectopic Multiple Live Births   1               Home Medications    Prior to Admission medications   Medication Sig Start Date End Date Taking? Authorizing Provider  ACCU-CHEK SOFTCLIX LANCETS lancets USE AS INSTRUCTED 10/14/15  Yes Arnoldo Morale, MD  acetaminophen (TYLENOL) 500 MG tablet Take 1,000 mg by mouth every 6 (six) hours as needed for mild pain or moderate pain.   Yes Historical Provider, MD  amLODipine (NORVASC) 2.5 MG tablet Take 1 tablet (2.5 mg total) by mouth daily. Reported on 09/29/2015 02/01/16  Yes Maren Reamer, MD  glucose blood test strip Use as instructed 02/01/16  Yes Maren Reamer, MD  ibuprofen (ADVIL,MOTRIN) 200 MG tablet Take 400 mg by mouth every 6 (six) hours as needed for headache, mild pain or moderate pain.   Yes Historical Provider, MD  Insulin Glargine (  LANTUS SOLOSTAR) 100 UNIT/ML Solostar Pen Inject 45 Units into the skin daily at 10 pm. Discontinue previous dose 06/12/16  Yes Merryl Hacker, MD  Lancets (ACCU-CHEK SOFT TOUCH) lancets Use as instructed for once daily blood glucose testing 02/01/16  Yes Maren Reamer, MD  naproxen (NAPROSYN) 500 MG tablet Take 1 tablet (500 mg total) by mouth 2 (two) times daily. Patient taking differently: Take 500 mg by mouth 2 (two) times daily as needed for moderate pain or headache.  02/09/16  Yes April Palumbo, MD  albuterol (PROVENTIL HFA;VENTOLIN HFA) 108 (90 Base) MCG/ACT inhaler Inhale 2 puffs into  the lungs every 6 (six) hours as needed for wheezing or shortness of breath. Reported on 09/29/2015 Patient not taking: Reported on 07/14/2016 02/29/16   Daryl F de Villier II, PA  amoxicillin (AMOXIL) 500 MG capsule Take 1 capsule (500 mg total) by mouth 2 (two) times daily. 07/15/16   Ozella Almond Ward, PA-C  Blood Glucose Monitoring Suppl (ACCU-CHEK AVIVA PLUS) w/Device KIT 1 each by Does not apply route 3 (three) times daily. Patient not taking: Reported on 07/14/2016 09/13/15   Tresa Garter, MD  butalbital-acetaminophen-caffeine (FIORICET, ESGIC) 50-325-40 MG tablet Take 1 tablet by mouth 2 (two) times daily as needed for headache. Patient not taking: Reported on 02/29/2016 09/07/15   Tresa Garter, MD  chlorhexidine (HIBICLENS) 4 % external liquid Apply topically daily as needed. Patient not taking: Reported on 07/14/2016 06/12/16   Merryl Hacker, MD  cyclobenzaprine (FLEXERIL) 10 MG tablet Take 1 tablet (10 mg total) by mouth 3 (three) times daily as needed for muscle spasms. Patient not taking: Reported on 02/29/2016 12/14/15   Jola Schmidt, MD  doxycycline (VIBRAMYCIN) 100 MG capsule Take 1 capsule (100 mg total) by mouth 2 (two) times daily. Patient not taking: Reported on 07/14/2016 06/04/16   Kinnie Feil, PA-C  gabapentin (NEURONTIN) 300 MG capsule Take 1 capsule (300 mg total) by mouth 3 (three) times daily. Patient not taking: Reported on 06/12/2016 02/01/16   Maren Reamer, MD  ibuprofen (ADVIL,MOTRIN) 600 MG tablet Take 1 tablet (600 mg total) by mouth every 8 (eight) hours as needed. Patient not taking: Reported on 06/12/2016 12/14/15   Jola Schmidt, MD  metFORMIN (GLUCOPHAGE) 500 MG tablet Take 1 tablet (500 mg total) by mouth 2 (two) times daily with a meal. Patient not taking: Reported on 07/14/2016 03/29/16   Argentina Donovan, PA-C  mometasone-formoterol (DULERA) 100-5 MCG/ACT AERO Inhale 2 puffs into the lungs 2 (two) times daily. Patient not taking: Reported on 02/29/2016  02/01/16   Maren Reamer, MD  pantoprazole (PROTONIX) 40 MG tablet Take 1 tablet (40 mg total) by mouth daily. Patient not taking: Reported on 07/14/2016 03/29/16   Argentina Donovan, PA-C  sucralfate (CARAFATE) 1 GM/10ML suspension Take 10 mLs (1 g total) by mouth 4 (four) times daily -  with meals and at bedtime. Patient not taking: Reported on 02/29/2016 02/01/16   Maren Reamer, MD    Family History Family History  Problem Relation Age of Onset  . Asthma Mother   . Asthma Father     Social History Social History  Substance Use Topics  . Smoking status: Former Smoker    Packs/day: 0.00    Types: Cigarettes    Quit date: 11/02/2013  . Smokeless tobacco: Never Used  . Alcohol use No     Allergies   Morphine and related; Metformin and related; and Glyburide   Review  of Systems Review of Systems  HENT: Positive for congestion and ear pain (R).   Respiratory: Positive for cough and shortness of breath.   Gastrointestinal: Positive for nausea. Negative for abdominal pain and vomiting.  Musculoskeletal: Positive for myalgias.  Neurological: Positive for headaches.  All other systems reviewed and are negative.    Physical Exam Updated Vital Signs BP 124/93   Pulse 91   Temp 98.2 F (36.8 C) (Oral)   Resp 18   SpO2 100%   Physical Exam  Constitutional: She is oriented to person, place, and time. She appears well-developed and well-nourished. No distress.  Non-toxic appearing  HENT:  Head: Normocephalic and atraumatic.  Right Ear: No mastoid tenderness. Tympanic membrane is erythematous.  Left Ear: Tympanic membrane and ear canal normal.  Oropharynx with erythema, no exudates or tonsillar hypertrophy. +nasal congestion.   Eyes: Conjunctivae and EOM are normal.  Neck: Normal range of motion. Neck supple.  No meningeal signs.  Cardiovascular: Normal rate, regular rhythm and normal heart sounds.   No murmur heard. Pulmonary/Chest: Effort normal and breath sounds  normal. No respiratory distress. She has no wheezes. She has no rales. She exhibits no tenderness.  Abdominal: Soft. Bowel sounds are normal. She exhibits no distension. There is no tenderness.  Musculoskeletal: Normal range of motion.  Neurological: She is alert and oriented to person, place, and time.  Skin: Skin is warm and dry.  Psychiatric: She has a normal mood and affect. Her behavior is normal.  Nursing note and vitals reviewed.    ED Treatments / Results  Labs (all labs ordered are listed, but only abnormal results are displayed) Labs Reviewed  BASIC METABOLIC PANEL - Abnormal; Notable for the following:       Result Value   Sodium 134 (*)    Glucose, Bld 422 (*)    All other components within normal limits  URINALYSIS, ROUTINE W REFLEX MICROSCOPIC - Abnormal; Notable for the following:    Color, Urine AMBER (*)    Specific Gravity, Urine 1.043 (*)    Glucose, UA >=500 (*)    Hgb urine dipstick SMALL (*)    Leukocytes, UA TRACE (*)    Squamous Epithelial / LPF 0-5 (*)    All other components within normal limits  CBG MONITORING, ED - Abnormal; Notable for the following:    Glucose-Capillary 400 (*)    All other components within normal limits  CBG MONITORING, ED - Abnormal; Notable for the following:    Glucose-Capillary 313 (*)    All other components within normal limits  CBG MONITORING, ED - Abnormal; Notable for the following:    Glucose-Capillary 297 (*)    All other components within normal limits  CBC    EKG  EKG Interpretation None       Radiology Dg Chest 2 View  Result Date: 07/14/2016 CLINICAL DATA:  Body aches cough EXAM: CHEST  2 VIEW COMPARISON:  06/11/2016 FINDINGS: The heart size and mediastinal contours are within normal limits. Both lungs are clear. The visualized skeletal structures are unremarkable. IMPRESSION: No active cardiopulmonary disease. Electronically Signed   By: Donavan Foil M.D.   On: 07/14/2016 23:27     Procedures Procedures (including critical care time)  DIAGNOSTIC STUDIES: Oxygen Saturation is 100% on RA, normal by my interpretation.    COORDINATION OF CARE: 10:37 PM Discussed treatment plan with pt at bedside and pt agreed to plan.  Medications Ordered in ED Medications  sodium chloride 0.9 % bolus 1,000  mL (0 mLs Intravenous Stopped 07/14/16 2353)  insulin glargine (LANTUS) injection 45 Units (45 Units Subcutaneous Given 07/14/16 2325)  sodium chloride 0.9 % bolus 1,000 mL (0 mLs Intravenous Stopped 07/15/16 0105)     Initial Impression / Assessment and Plan / ED Course  I have reviewed the triage vital signs and the nursing notes.  Pertinent labs & imaging results that were available during my care of the patient were reviewed by me and considered in my medical decision making (see chart for details).    Sarah Garrison is a 36 y.o. female who presents to ED for generalized body aches, headaches, cough, congestion, right ear pain beginning yesterday. Coworker with flu. Symptoms do appear viral in etiology. Lungs are CTA and CXR negative. OP with erythema but no exudates or hypertrophy. She is afebrile with reassuring vital signs. Ear exam does show erythematous right TM. Left nl. Hx of ear infections annually, typically treated with antibiotics. Will give rx for amoxil for AOM.   Patient also notes elevated blood sugar readings for the last two weeks. She has been out of home Lantus for one week due to financial reasons. She does have rx at the pharmacy. Discussed importance of glycemic control and obtaining this medication if at all possible. Also discussed dietary changes such as limiting carbs, soft drinks, sugar, etc. CBG of 400 initially. Does not appear to be in DKA. No ketones in urine. Normal Co2 and anion gap. She brought home glucometer with her which shows sugars in 300-550 range over the last two weeks. Home lantus dose and 2L fluids given. CBG downtrending  appropriately.   Evaluation does not show pathology that would require ongoing emergent intervention or inpatient treatment. Patient is hemodynamically stable and mentating appropriately. Discussed findings and plan with patient who agrees with treatment plan as dictated. PCP follow up strongly encourage. Return precautions discussed and all questions answered.    Final Clinical Impressions(s) / ED Diagnoses   Final diagnoses:  Cough  Hyperglycemia  Acute otitis media, unspecified otitis media type    New Prescriptions New Prescriptions   AMOXICILLIN (AMOXIL) 500 MG CAPSULE    Take 1 capsule (500 mg total) by mouth 2 (two) times daily.   I personally performed the services described in this documentation, which was scribed in my presence. The recorded information has been reviewed and is accurate.    [facility], PA-C 07/15/16 8003    Malvin Johns, MD 07/15/16 [phone]

## 2016-07-14 NOTE — ED Triage Notes (Addendum)
Pt reports hyperglycemia in the 500s at home accompanied by generalized body aches including head and CP. Has been out of insulin for the past 2 weeks.  Pt is also out of her HTN medication.

## 2016-07-15 MED ORDER — SODIUM CHLORIDE 0.9 % IV BOLUS (SEPSIS)
1000.0000 mL | Freq: Once | INTRAVENOUS | Status: AC
  Administered 2016-07-15: 1000 mL via INTRAVENOUS

## 2016-07-15 MED ORDER — AMOXICILLIN 500 MG PO CAPS: 500.0000 mg | ORAL_CAPSULE | Freq: Two times a day (BID) | ORAL | 0 refills | Status: DC

## 2016-07-15 NOTE — Discharge Instructions (Signed)
It was my pleasure taking care of you today!  Please take all of your antibiotics until finished! [facility]  Increase hydration.  It is very important that you get your insulin filled and monitor your blood sugar regularly. Please follow up with your primary care provider as well for discussion of your diabetes. Limit sugar and carb (breads, pastas, etc.) intake.   Return to ER for new or worsening symptoms, any additional concerns.

## 2016-07-16 ENCOUNTER — Emergency Department (HOSPITAL_COMMUNITY)
Admission: EM | Admit: 2016-07-16 | Discharge: 2016-07-16 | Disposition: A | Discharge status: Home / Self Care | Payer: Medicaid Other | Attending: Emergency Medicine | Admitting: Emergency Medicine

## 2016-07-16 ENCOUNTER — Encounter (HOSPITAL_COMMUNITY): Payer: Self-pay | Admitting: Emergency Medicine

## 2016-07-16 DIAGNOSIS — Z87891 Personal history of nicotine dependence: Secondary | ICD-10-CM | POA: Insufficient documentation

## 2016-07-16 DIAGNOSIS — E1140 Type 2 diabetes mellitus with diabetic neuropathy, unspecified: Secondary | ICD-10-CM | POA: Insufficient documentation

## 2016-07-16 DIAGNOSIS — J45909 Unspecified asthma, uncomplicated: Secondary | ICD-10-CM | POA: Diagnosis not present

## 2016-07-16 DIAGNOSIS — J111 Influenza due to unidentified influenza virus with other respiratory manifestations: Secondary | ICD-10-CM | POA: Diagnosis not present

## 2016-07-16 DIAGNOSIS — I10 Essential (primary) hypertension: Secondary | ICD-10-CM | POA: Diagnosis not present

## 2016-07-16 DIAGNOSIS — R69 Illness, unspecified: Secondary | ICD-10-CM

## 2016-07-16 DIAGNOSIS — Z794 Long term (current) use of insulin: Secondary | ICD-10-CM | POA: Insufficient documentation

## 2016-07-16 DIAGNOSIS — R05 Cough: Secondary | ICD-10-CM | POA: Diagnosis present

## 2016-07-16 LAB — CBG MONITORING, ED: GLUCOSE-CAPILLARY: 364 mg/dL — AB (ref 65–99)

## 2016-07-16 MED ORDER — BENZONATATE 100 MG PO CAPS: 100.0000 mg | ORAL_CAPSULE | Freq: Three times a day (TID) | ORAL | 0 refills | Status: DC

## 2016-07-16 MED ORDER — IBUPROFEN 800 MG PO TABS
800.0000 mg | ORAL_TABLET | Freq: Once | ORAL | Status: AC
  Administered 2016-07-16: 800 mg via ORAL
  Filled 2016-07-16: qty 1

## 2016-07-16 MED ORDER — ALBUTEROL SULFATE HFA 108 (90 BASE) MCG/ACT IN AERS
2.0000 | INHALATION_SPRAY | RESPIRATORY_TRACT | Status: DC | PRN
  Administered 2016-07-16: 2 via RESPIRATORY_TRACT
  Filled 2016-07-16: qty 6.7

## 2016-07-16 MED ORDER — PREDNISONE 20 MG PO TABS: 40.0000 mg | ORAL_TABLET | Freq: Every day | ORAL | 0 refills | Status: DC

## 2016-07-16 NOTE — ED Notes (Signed)
Patient was alert, oriented and stable upon discharge. RN went over AVS and patient had no further questions.  

## 2016-07-16 NOTE — ED Notes (Signed)
Presents with cough, onset this past Saturday. Having chest pain/ aches with cough. Having non-prod cough per pt statement, states also has had low grade fever as well

## 2016-07-16 NOTE — ED Notes (Signed)
POX 97% on RA

## 2016-07-16 NOTE — ED Provider Notes (Signed)
[facility] DEPT Provider Note   CSN: [id number] Arrival date & time: 07/16/16  2008  By signing my name below, I, Gwenlyn Fudge, attest that this documentation has been prepared under the direction and in the presence of Carlisle Cater, PA-C. Electronically Signed: Gwenlyn Fudge, ED Scribe. 07/16/16. 9:28 PM.   History   Chief Complaint Chief Complaint  Patient presents with  . Cough   The history is provided by the patient. No language interpreter was used.    HPI Comments: Sarah Garrison is a 36 y.o. female with PMHx of Asthma, DM, GERD and HTN who presents to the Emergency Department complaining of gradual onset, constant, moderate generalized body aches for 2 days. Pt reports associated chest tightness, headache, ear pain, fever (Tmax 102), and cough. She states her chest tightness is similar to an exacerbation of her asthma. Pt was seen 2 days ago for ear infection and was placed on Amoxicillin. She reports sick contact at work. Pt has received the flu vaccination this year. Pt has tried Tylenol and Ibuprofen with no significant relief to symptoms. She states her blood sugar normally runs high (in the 500's). No PMHx of blood clots. Denies recent long travel. Pt denies calf tenderness or acute leg swelling. Pt has not been using her inhaler because she has run out.  Past Medical History:  Diagnosis Date  . Asthma   . Diabetes mellitus without complication ([place])   . GERD (gastroesophageal reflux disease)   . Hypertension     Patient Active Problem List   Diagnosis Date Noted  . Uses depot medroxyprogesterone acetate as primary birth control method 05/07/2016  . Encounter for pregnancy test 02/01/2016  . Leg swelling 09/29/2015  . Morbid obesity ([place]) 09/29/2015  . DM neuropathy, type II diabetes mellitus ([place]) 08/24/2015    Past Surgical History:  Procedure Laterality Date  . CESAREAN SECTION    . TUBAL LIGATION    . WISDOM TOOTH EXTRACTION      OB History    Gravida Para Term Preterm AB Living   _0 SAB TAB Ectopic Multiple Live Births   1               Home Medications    Prior to Admission medications   Medication Sig Start Date End Date Taking? Authorizing Provider  ACCU-CHEK SOFTCLIX LANCETS lancets USE AS INSTRUCTED 10/14/15   Arnoldo Morale, MD  acetaminophen (TYLENOL) 500 MG tablet Take 1,000 mg by mouth every 6 (six) hours as needed for mild pain or moderate pain.    Historical Provider, MD  albuterol (PROVENTIL HFA;VENTOLIN HFA) 108 (90 Base) MCG/ACT inhaler Inhale 2 puffs into the lungs every 6 (six) hours as needed for wheezing or shortness of breath. Reported on 09/29/2015 Patient not taking: Reported on 07/14/2016 02/29/16   Daryl F de Villier II, PA  amLODipine (NORVASC) 2.5 MG tablet Take 1 tablet (2.5 mg total) by mouth daily. Reported on 09/29/2015 02/01/16   Maren Reamer, MD  amoxicillin (AMOXIL) 500 MG capsule Take 1 capsule (500 mg total) by mouth 2 (two) times daily. 07/15/16   Ozella Almond Ward, PA-C  Blood Glucose Monitoring Suppl (ACCU-CHEK AVIVA PLUS) w/Device KIT 1 each by Does not apply route 3 (three) times daily. Patient not taking: Reported on 07/14/2016 09/13/15   Tresa Garter, MD  butalbital-acetaminophen-caffeine (FIORICET, ESGIC) 50-325-40 MG tablet Take 1 tablet by mouth 2 (two) times daily as needed for headache. Patient  not taking: Reported on 02/29/2016 09/07/15   Tresa Garter, MD  chlorhexidine (HIBICLENS) 4 % external liquid Apply topically daily as needed. Patient not taking: Reported on 07/14/2016 06/12/16   Merryl Hacker, MD  cyclobenzaprine (FLEXERIL) 10 MG tablet Take 1 tablet (10 mg total) by mouth 3 (three) times daily as needed for muscle spasms. Patient not taking: Reported on 02/29/2016 12/14/15   Jola Schmidt, MD  doxycycline (VIBRAMYCIN) 100 MG capsule Take 1 capsule (100 mg total) by mouth 2 (two) times daily. Patient not taking: Reported on 07/14/2016 06/04/16   Kinnie Feil, PA-C  gabapentin (NEURONTIN) 300 MG capsule Take 1 capsule (300 mg total) by mouth 3 (three) times daily. Patient not taking: Reported on 06/12/2016 02/01/16   Maren Reamer, MD  glucose blood test strip Use as instructed 02/01/16   Maren Reamer, MD  ibuprofen (ADVIL,MOTRIN) 200 MG tablet Take 400 mg by mouth every 6 (six) hours as needed for headache, mild pain or moderate pain.    Historical Provider, MD  ibuprofen (ADVIL,MOTRIN) 600 MG tablet Take 1 tablet (600 mg total) by mouth every 8 (eight) hours as needed. Patient not taking: Reported on 06/12/2016 12/14/15   Jola Schmidt, MD  Insulin Glargine (LANTUS SOLOSTAR) 100 UNIT/ML Solostar Pen Inject 45 Units into the skin daily at 10 pm. Discontinue previous dose 06/12/16   Merryl Hacker, MD  Lancets (ACCU-CHEK SOFT TOUCH) lancets Use as instructed for once daily blood glucose testing 02/01/16   Maren Reamer, MD  metFORMIN (GLUCOPHAGE) 500 MG tablet Take 1 tablet (500 mg total) by mouth 2 (two) times daily with a meal. Patient not taking: Reported on 07/14/2016 03/29/16   Dionne Bucy McClung, PA-C  mometasone-formoterol (DULERA) 100-5 MCG/ACT AERO Inhale 2 puffs into the lungs 2 (two) times daily. Patient not taking: Reported on 02/29/2016 02/01/16   Maren Reamer, MD  naproxen (NAPROSYN) 500 MG tablet Take 1 tablet (500 mg total) by mouth 2 (two) times daily. Patient taking differently: Take 500 mg by mouth 2 (two) times daily as needed for moderate pain or headache.  02/09/16   April Palumbo, MD  pantoprazole (PROTONIX) 40 MG tablet Take 1 tablet (40 mg total) by mouth daily. Patient not taking: Reported on 07/14/2016 03/29/16   Argentina Donovan, PA-C  sucralfate (CARAFATE) 1 GM/10ML suspension Take 10 mLs (1 g total) by mouth 4 (four) times daily -  with meals and at bedtime. Patient not taking: Reported on 02/29/2016 02/01/16   Maren Reamer, MD    Family History Family History  Problem Relation Age of Onset  . Asthma Mother     . Asthma Father     Social History Social History  Substance Use Topics  . Smoking status: Former Smoker    Packs/day: 0.00    Types: Cigarettes    Quit date: 11/02/2013  . Smokeless tobacco: Never Used  . Alcohol use No     Allergies   Morphine and related; Metformin and related; and Glyburide   Review of Systems Review of Systems  Constitutional: Positive for fever. Negative for chills and fatigue.  HENT: Positive for ear pain. Negative for congestion, rhinorrhea, sinus pressure and sore throat.   Eyes: Negative for redness.  Respiratory: Positive for cough and chest tightness. Negative for wheezing.   Cardiovascular: Negative for leg swelling.  Gastrointestinal: Negative for abdominal pain, diarrhea, nausea and vomiting.  Genitourinary: Negative for dysuria.  Musculoskeletal: Negative for myalgias and neck stiffness.       +  Generalized body aches No calf pain  Skin: Negative for rash.  Neurological: Positive for headaches.  Hematological: Negative for adenopathy.    Physical Exam Updated Vital Signs BP 152/93 (BP Location: Right Arm)   Pulse 112   Temp 99.2 F (37.3 C) (Oral)   Resp 20   SpO2 98%   Physical Exam  Constitutional: She appears well-developed and well-nourished.  HENT:  Head: Normocephalic and atraumatic.  Right Ear: Tympanic membrane, external ear and ear canal normal.  Left Ear: Tympanic membrane, external ear and ear canal normal.  Nose: Nose normal. No mucosal edema or rhinorrhea.  Mouth/Throat: Uvula is midline, oropharynx is clear and moist and mucous membranes are normal. Mucous membranes are not dry. No oral lesions. No trismus in the jaw. No uvula swelling. No oropharyngeal exudate, posterior oropharyngeal edema, posterior oropharyngeal erythema or tonsillar abscesses.  Eyes: Conjunctivae are normal. Right eye exhibits no discharge. Left eye exhibits no discharge.  Neck: Normal range of motion. Neck supple.  Cardiovascular: Regular  rhythm and normal heart sounds.  Tachycardia present.   Mild tachycardia on exam (100)  Pulmonary/Chest: Effort normal and breath sounds normal. No respiratory distress. She has no wheezes. She has no rales.  Abdominal: Soft. There is no tenderness.  Lymphadenopathy:    She has no cervical adenopathy.  Neurological: She is alert.  Skin: Skin is warm and dry.  Psychiatric: She has a normal mood and affect.  Nursing note and vitals reviewed.    ED Treatments / Results  DIAGNOSTIC STUDIES: Oxygen Saturation is 98% on RA, normal by my interpretation.    COORDINATION OF CARE: 9:28 PM Discussed treatment plan with pt at bedside which includes Tessalon, Ibuprofen, Prednisone and Albuterol and pt agreed to plan.  Labs (all labs ordered are listed, but only abnormal results are displayed) Labs Reviewed  CBG MONITORING, ED - Abnormal; Notable for the following:       Result Value   Glucose-Capillary 364 (*)    All other components within normal limits    Radiology Dg Chest 2 View  Result Date: 07/14/2016 CLINICAL DATA:  Body aches cough EXAM: CHEST  2 VIEW COMPARISON:  06/11/2016 FINDINGS: The heart size and mediastinal contours are within normal limits. Both lungs are clear. The visualized skeletal structures are unremarkable. IMPRESSION: No active cardiopulmonary disease. Electronically Signed   By: Donavan Foil M.D.   On: 07/14/2016 23:27    Procedures Procedures (including critical care time)  Medications Ordered in ED Medications - No data to display   Initial Impression / Assessment and Plan / ED Course  I have reviewed the triage vital signs and the nursing notes.  Pertinent labs & imaging results that were available during my care of the patient were reviewed by me and considered in my medical decision making (see chart for details).      Vital signs reviewed and are as follows: Vitals:   07/16/16 2020  BP: 152/93  Pulse: 112  Resp: 20  Temp: 99.2 F (37.3 C)    Patient urged to return with worsening symptoms or other concerns. Patient verbalized understanding and agrees with plan.   Discussed with patient that she should not take the prednisone if her blood sugars are greater than 300. She is to take her insulin as prescribed and check her blood sugars carefully. Feel that given her symptoms, she would likely benefit from steroid treatment. Patient states "I've been given steroids in the past when my sugars have been higher". It is  364 today.   I personally performed the services described in this documentation, which was scribed in my presence. The recorded information has been reviewed and is accurate.   Final Clinical Impressions(s) / ED Diagnoses   Final diagnoses:  Influenza-like illness   Patient with influenza-like illness. She has a history of asthma and this is likely contributing. She does not have any significant wheezing or respiratory distress at time of exam. She does have a history of diabetes, although poorly controlled, and is insulin-dependent. She states that her blood sugars typically run very high. It is 364 here tonight. Patient does not have any signs or symptoms of DKA. She does not appear to be dehydrated. Tessalon and prednisone given for symptom control. Also provided with an albuterol inhaler. Discussed use of prednisone as documented above.  New Prescriptions Discharge Medication List as of 07/16/2016  9:55 PM    START taking these medications   Details  benzonatate (TESSALON) 100 MG capsule Take 1 capsule (100 mg total) by mouth every 8 (eight) hours., Starting Mon 07/16/2016, Print    predniSONE (DELTASONE) 20 MG tablet Take 2 tablets (40 mg total) by mouth daily., Starting Mon 07/16/2016, Print         [place], PA-C 07/17/16 [place], MD 07/23/16 [phone]

## 2016-07-16 NOTE — ED Triage Notes (Signed)
Pt states she has cough that is making her chest hurt  PT states she has been sick since Friday and was seen here Saturday and diagnosed with an ear infection and given a script for amoxicillin  Pt states she started the medication on Sunday

## 2016-07-16 NOTE — Discharge Instructions (Signed)
Please read and follow all provided instructions.  Your diagnoses today include:  1. Influenza-like illness    Tests performed today include:  Vital signs. See below for your results today.   Medications prescribed:   Prednisone - steroid medicine   It is best to take this medication in the morning to prevent sleeping problems. If you are diabetic, monitor your blood sugar closely and stop taking Prednisone if blood sugar is over 300. Take with food to prevent stomach upset.    Tessalon Per[place] - cough suppressant medication   Albuterol inhaler - medication that opens up your airway  Use inhaler as follows: 1-2 puffs with spacer every 4 hours as needed for wheezing, cough, or shortness of breath.   Take any prescribed medications only as directed.  Home care instructions:  Follow any educational materials contained in this packet. Please continue drinking plenty of fluids. Use over-the-counter cold and flu medications as needed as directed on packaging for symptom relief. You may also use ibuprofen or tylenol as directed on packaging for pain or fever.   BE VERY CAREFUL not to take multiple medicines containing Tylenol (also called acetaminophen). Doing so can lead to an overdose which can damage your liver and cause liver failure and possibly death.   Follow-up instructions: Please follow-up with your primary care provider in the next 3 days for further evaluation of your symptoms.   Return instructions:   Please return to the Emergency Department if you experience worsening symptoms.  Please return if you have a high fever greater than 101 degrees not controlled with over-the-counter medications, persistent vomiting and cannot keep down fluids, or worsening trouble breathing.  Please return if you have any other emergent concerns.  Additional Information:  Your vital signs today were: BP 152/93 (BP Location: Right Arm)    Pulse 112    Temp 99.2 F (37.3 C) (Oral)    Resp  20    SpO2 98%  If your blood pressure (BP) was elevated above 135/85 this visit, please have this repeated by your doctor within one month.

## 2016-07-18 ENCOUNTER — Telehealth: Payer: Self-pay | Admitting: Internal Medicine

## 2016-07-18 DIAGNOSIS — E1140 Type 2 diabetes mellitus with diabetic neuropathy, unspecified: Secondary | ICD-10-CM

## 2016-07-18 MED ORDER — INSULIN GLARGINE 100 UNIT/ML SOLOSTAR PEN: 45.0000 [IU] | PEN_INJECTOR | Freq: Every day | SUBCUTANEOUS | 0 refills | Status: DC

## 2016-07-18 NOTE — Telephone Encounter (Signed)
Patient called requesting a refill of her insulin. States that she has not gotten it bc she was not aware that she had Medicaid to pay for it. Please f/u.

## 2016-07-18 NOTE — Telephone Encounter (Signed)
Will forward to stacey 

## 2016-07-18 NOTE — Telephone Encounter (Signed)
Sent in the prescription for insulin but patient must have office visit for future refills, especially since she has been off of the insulin

## 2016-07-20 ENCOUNTER — Encounter: Payer: Self-pay | Admitting: Physician Assistant

## 2016-07-20 ENCOUNTER — Ambulatory Visit: Payer: Medicaid Other | Attending: Internal Medicine | Admitting: *Deleted

## 2016-07-20 ENCOUNTER — Ambulatory Visit (HOSPITAL_BASED_OUTPATIENT_CLINIC_OR_DEPARTMENT_OTHER): Payer: Medicaid Other | Admitting: Physician Assistant

## 2016-07-20 VITALS — BP 120/88 | HR 98 | Temp 98.6°F | Resp 20 | Wt 231.6 lb

## 2016-07-20 DIAGNOSIS — E119 Type 2 diabetes mellitus without complications: Secondary | ICD-10-CM

## 2016-07-20 DIAGNOSIS — Z789 Other specified health status: Secondary | ICD-10-CM

## 2016-07-20 DIAGNOSIS — I10 Essential (primary) hypertension: Secondary | ICD-10-CM | POA: Insufficient documentation

## 2016-07-20 DIAGNOSIS — E1165 Type 2 diabetes mellitus with hyperglycemia: Secondary | ICD-10-CM | POA: Insufficient documentation

## 2016-07-20 DIAGNOSIS — R05 Cough: Secondary | ICD-10-CM

## 2016-07-20 DIAGNOSIS — J069 Acute upper respiratory infection, unspecified: Secondary | ICD-10-CM | POA: Diagnosis not present

## 2016-07-20 DIAGNOSIS — Z9119 Patient's noncompliance with other medical treatment and regimen: Secondary | ICD-10-CM | POA: Diagnosis not present

## 2016-07-20 DIAGNOSIS — Z79899 Other long term (current) drug therapy: Secondary | ICD-10-CM | POA: Diagnosis not present

## 2016-07-20 DIAGNOSIS — Z794 Long term (current) use of insulin: Secondary | ICD-10-CM | POA: Insufficient documentation

## 2016-07-20 DIAGNOSIS — H60501 Unspecified acute noninfective otitis externa, right ear: Secondary | ICD-10-CM

## 2016-07-20 DIAGNOSIS — R059 Cough, unspecified: Secondary | ICD-10-CM

## 2016-07-20 DIAGNOSIS — J45909 Unspecified asthma, uncomplicated: Secondary | ICD-10-CM | POA: Diagnosis not present

## 2016-07-20 LAB — GLUCOSE, POCT (MANUAL RESULT ENTRY): POC GLUCOSE: 342 mg/dL — AB (ref 70–99)

## 2016-07-20 MED ORDER — GLUCOSE BLOOD VI STRP: ORAL_STRIP | 12 refills | Status: DC

## 2016-07-20 MED ORDER — ALBUTEROL SULFATE HFA 108 (90 BASE) MCG/ACT IN AERS: 2.0000 | INHALATION_SPRAY | Freq: Four times a day (QID) | RESPIRATORY_TRACT | 2 refills | Status: DC | PRN

## 2016-07-20 MED ORDER — SALINE SPRAY 0.65 % NA SOLN: 1.0000 | NASAL | 0 refills | Status: DC | PRN

## 2016-07-20 MED ORDER — PHENYLEPHRINE HCL 1 % NA SOLN: 1.0000 [drp] | Freq: Four times a day (QID) | NASAL | 0 refills | Status: DC | PRN

## 2016-07-20 MED ORDER — ACCU-CHEK SOFTCLIX LANCETS MISC: 12 refills | Status: DC

## 2016-07-20 MED ORDER — INSULIN GLARGINE 100 UNIT/ML SOLOSTAR PEN: 50.0000 [IU] | PEN_INJECTOR | Freq: Every day | SUBCUTANEOUS | 2 refills | Status: DC

## 2016-07-20 MED ORDER — NEOMYCIN-COLIST-HC-THONZONIUM 3.3-3-10-0.5 MG/ML OT SUSP: 4.0000 [drp] | Freq: Four times a day (QID) | OTIC | 0 refills | Status: DC

## 2016-07-20 MED ORDER — INSULIN ASPART 100 UNIT/ML ~~LOC~~ SOLN: 10.0000 [IU] | Freq: Once | SUBCUTANEOUS | Status: DC

## 2016-07-20 MED ORDER — MEDROXYPROGESTERONE ACETATE 150 MG/ML IM SUSP
150.0000 mg | Freq: Once | INTRAMUSCULAR | Status: AC
  Administered 2016-07-20: 150 mg via INTRAMUSCULAR

## 2016-07-20 MED ORDER — DM-APAP-CPM 15-500-2 MG PO TABS: 2.0000 | ORAL_TABLET | Freq: Four times a day (QID) | ORAL | 0 refills | Status: AC

## 2016-07-20 MED FILL — PROVENTIL HFA 90 MCG INH: 108 (90 BAS | 30 days supply | Qty: 7 | Fill #0

## 2016-07-20 MED FILL — LANTUS SOLOSTAR 100 UNITS/M: 100 | 30 days supply | Qty: 15 | Fill #0

## 2016-07-20 MED FILL — ACCU-CHEK SOFTCLIX LANCETS: 33 days supply | Qty: 100 | Fill #0

## 2016-07-20 MED FILL — ACCU-CHEK AVIVA PLUS TEST S: 25 days supply | Qty: 100 | Fill #0

## 2016-07-20 NOTE — Patient Instructions (Addendum)
Please take medications as directed. Keep a blood glucose log to present at your follow up on 07/24/16 or 07/25/16. Call here or call an ambulance should your symptoms worsen.  Hyperglycemia Hyperglycemia occurs when the level of sugar (glucose) in the blood is too high. Glucose is a type of sugar that provides the body's main source of energy. Certain hormones (insulin and glucagon) control the level of glucose in the blood. Insulin lowers blood glucose, and glucagon increases blood glucose. Hyperglycemia can result from having too little insulin in the bloodstream, or from the body not responding normally to insulin. Hyperglycemia occurs most often in people who have diabetes (diabetes mellitus), but it can happen in people who do not have diabetes. It can develop quickly, and it can be life-threatening if it causes you to become severely dehydrated (diabetic ketoacidosis or hyperglycemic hyperosmolar state). Severe hyperglycemia is a medical emergency. What are the causes? If you have diabetes, hyperglycemia may be caused by:  Diabetes medicine.  Medicines that increase blood glucose or affect your diabetes control.  Not eating enough, or not eating often enough.  Changes in physical activity level.  Being sick or having an infection. If you have prediabetes or undiagnosed diabetes:  Hyperglycemia may be caused by those conditions. If you do not have diabetes, hyperglycemia may be caused by:  Certain medicines, including steroid medicines, beta-blockers, epinephrine, and thiazide diuretics.  Stress.  Serious illness.  Surgery.  Diseases of the pancreas.  Infection. What increases the risk? Hyperglycemia is more likely to develop in people who have risk factors for diabetes, such as:  Having a family member with diabetes.  Having a gene for type 1 diabetes that is passed from parent to child (inherited).  Living in an area with cold weather conditions.  Exposure to certain  viruses.  Certain conditions in which the body's disease-fighting (immune) system attacks itself (autoimmune disorders).  Being overweight or obese.  Having an inactive (sedentary) lifestyle.  Having been diagnosed with insulin resistance.  Having a history of prediabetes, gestational diabetes, or polycystic ovarian syndrome (PCOS).  Being of American-Indian, African-American, Hispanic/Latino, or Asian/Pacific Islander descent. What are the signs or symptoms? Hyperglycemia may not cause any symptoms. If you do have symptoms, they may include early warning signs, such as:  Increased thirst.  Hunger.  Feeling very tired.  Needing to urinate more often than usual.  Blurry vision. Other symptoms may develop if hyperglycemia gets worse, such as:  Dry mouth.  Loss of appetite.  Fruity-smelling breath.  Weakness.  Unexpected or rapid weight gain or weight loss.  Tingling or numbness in the hands or feet.  Headache.  Skin that does not quickly return to normal after being lightly pinched and released (poor skin turgor).  Abdominal pain.  Cuts or bruises that are slow to heal. How is this diagnosed? Hyperglycemia is diagnosed with a blood test to measure your blood glucose level. This blood test is usually done while you are having symptoms. Your health care provider may also do a physical exam and review your medical history. You may have more tests to determine the cause of your hyperglycemia, such as:  A fasting blood glucose (FBG) test. You will not be allowed to eat (you will fast) for at least 8 hours before a blood sample is taken.  An A1c (hemoglobin A1c) blood test. This provides information about blood glucose control over the previous 2-3 months.  An oral glucose tolerance test (OGTT). This measures your blood glucose at  two times:  After fasting. This is your baseline blood glucose level.  Two hours after drinking a beverage that contains glucose. How is  this treated? Treatment depends on the cause of your hyperglycemia. Treatment may include:  Taking medicine to regulate your blood glucose levels. If you take insulin or other diabetes medicines, your medicine or dosage may be adjusted.  Lifestyle changes, such as exercising more, eating healthier foods, or losing weight.  Treating an illness or infection, if this caused your hyperglycemia.  Checking your blood glucose more often.  Stopping or reducing steroid medicines, if these caused your hyperglycemia. If your hyperglycemia becomes severe and it results in hyperglycemic hyperosmolar state, you must be hospitalized and given IV fluids. Follow these instructions at home: General instructions  Take over-the-counter and prescription medicines only as told by your health care provider.  Do not use any products that contain nicotine or tobacco, such as cigarettes and e-cigarettes. If you need help quitting, ask your health care provider.  Limit alcohol intake to no more than 1 drink per day for nonpregnant women and 2 drinks per day for men. One drink equals 12 oz of beer, 5 oz of wine, or 1 oz of hard liquor.  Learn to manage stress. If you need help with this, ask your health care provider.  Keep all follow-up visits as told by your health care provider. This is important. Eating and drinking  Maintain a healthy weight.  Exercise regularly, as directed by your health care provider.  Stay hydrated, especially when you exercise, get sick, or spend time in hot temperatures.  Eat healthy foods, such as:  Lean proteins.  Complex carbohydrates.  Fresh fruits and vegetables.  Low-fat dairy products.  Healthy fats.  Drink enough fluid to keep your urine clear or pale yellow. If you have diabetes:   Make sure you know the symptoms of hyperglycemia.  Follow your diabetes management plan, as told by your health care provider. Make sure you:  Take your insulin and medicines  as directed.  Follow your exercise plan.  Follow your meal plan. Eat on time, and do not skip meals.  Check your blood glucose as often as directed. Make sure to check your blood glucose before and after exercise. If you exercise longer or in a different way than usual, check your blood glucose more often.  Follow your sick day plan whenever you cannot eat or drink normally. Make this plan in advance with your health care provider.  Share your diabetes management plan with people in your workplace, school, and household.  Check your urine for ketones when you are ill and as told by your health care provider.  Carry a medical alert card or wear medical alert jewelry. Contact a health care provider if:  Your blood glucose is at or above 240 mg/dL ([phone] mmol/L) for 2 days in a row.  You have problems keeping your blood glucose in your target range.  You have frequent episodes of hyperglycemia. Get help right away if:  You have difficulty breathing.  You have a change in how you think, feel, or act (mental status).  You have nausea or vomiting that does not go away. These symptoms may represent a serious problem that is an emergency. Do not wait to see if the symptoms will go away. Get medical help right away. Call your local emergency services (911 in the [place] Do not drive yourself to the hospital.  Summary  Hyperglycemia occurs when the level of  sugar (glucose) in the blood is too high.  Hyperglycemia is diagnosed with a blood test to measure your blood glucose level. This blood test is usually done while you are having symptoms. Your health care provider may also do a physical exam and review your medical history.  If you have diabetes, follow your diabetes management plan as told by your health care provider.  Contact your health care provider if you have problems keeping your blood glucose in your target range. This information is not intended to replace advice given to you  by your health care provider. Make sure you discuss any questions you have with your health care provider. Document Released: 11/14/2000 Document Revised: 02/06/2016 Document Reviewed: 02/06/2016 Elsevier Interactive Patient Education  2017 [employer]   Blood Glucose Monitoring, Adult Monitoring your blood sugar (glucose) helps you manage your diabetes. It also helps you and your health care provider determine how well your diabetes management plan is working. Blood glucose monitoring involves checking your blood glucose as often as directed, and keeping a record (log) of your results over time. Why should I monitor my blood glucose? Checking your blood glucose regularly can:  Help you understand how food, exercise, illnesses, and medicines affect your blood glucose.  Let you know what your blood glucose is at any time. You can quickly tell if you are having low blood glucose (hypoglycemia) or high blood glucose (hyperglycemia).  Help you and your health care provider adjust your medicines as needed. When should I check my blood glucose? Follow instructions from your health care provider about how often to check your blood glucose. This may depend on:  The type of diabetes you have.  How well-controlled your diabetes is.  Medicines you are taking. If you have type 1 diabetes:  Check your blood glucose at least 2 times a day.  Also check your blood glucose:  Before every insulin injection.  Before and after exercise.  Between meals.  2 hours after a meal.  Occasionally between 2:00 a.m. and 3:00 a.m., as directed.  Before potentially dangerous tasks, like driving or using heavy machinery.  At bedtime.  You may need to check your blood glucose more often, up to 6-10 times a day:  If you use an insulin pump.  If you need multiple daily injections (MDI).  If your diabetes is not well-controlled.  If you are ill.  If you have a history of severe hypoglycemia.  If  you have a history of not knowing when your blood glucose is getting low (hypoglycemia unawareness). If you have type 2 diabetes:  If you take insulin or other diabetes medicines, check your blood glucose at least 2 times a day.  If you are on intensive insulin therapy, check your blood glucose at least 4 times a day. Occasionally, you may also need to check between 2:00 a.m. and 3:00 a.m., as directed.  Also check your blood glucose:  Before and after exercise.  Before potentially dangerous tasks, like driving or using heavy machinery.  You may need to check your blood glucose more often if:  Your medicine is being adjusted.  Your diabetes is not well-controlled.  You are ill. What is a blood glucose log?  A blood glucose log is a record of your blood glucose readings. It helps you and your health care provider:  Look for patterns in your blood glucose over time.  Adjust your diabetes management plan as needed.  Every time you check your blood glucose, write down  your result and notes about things that may be affecting your blood glucose, such as your diet and exercise for the day.  Most glucose meters store a record of glucose readings in the meter. Some meters allow you to download your records to a computer. How do I check my blood glucose? Follow these steps to get accurate readings of your blood glucose: Supplies needed   Blood glucose meter.  Test strips for your meter. Each meter has its own strips. You must use the strips that come with your meter.  A needle to prick your finger (lancet). Do not use lancets more than once.  A device that holds the lancet (lancing device).  A journal or log book to write down your results. Procedure  Wash your hands with soap and water.  Prick the side of your finger (not the tip) with the lancet. Use a different finger each time.  Gently rub the finger until a small drop of blood appears.  Follow instructions that come  with your meter for inserting the test strip, applying blood to the strip, and using your blood glucose meter.  Write down your result and any notes. Alternative testing sites  Some meters allow you to use areas of your body other than your finger (alternative sites) to test your blood.  If you think you may have hypoglycemia, or if you have hypoglycemia unawareness, do not use alternative sites. Use your finger instead.  Alternative sites may not be as accurate as the fingers, because blood flow is slower in these areas. This means that the result you get may be delayed, and it may be different from the result that you would get from your finger.  The most common alternative sites are:  Forearm.  Thigh.  Palm of the hand. Additional tips  Always keep your supplies with you.  If you have questions or need help, all blood glucose meters have a 24-hour "hotline" number that you can call. You may also contact your health care provider.  After you use a few boxes of test strips, adjust (calibrate) your blood glucose meter by following instructions that came with your meter. This information is not intended to replace advice given to you by your health care provider. Make sure you discuss any questions you have with your health care provider. Document Released: 05/24/2003 Document Revised: 12/09/2015 Document Reviewed: 10/31/2015 Elsevier Interactive Patient Education  2017 [employer].

## 2016-07-20 NOTE — Progress Notes (Addendum)
Subjective:  Patient ID: Sarah Sherifflicia M Jowett, female    DOB: 09-Jul-1980  Age: 36 y.o. MRN: [id number]  CC: Cough, flulike symptoms, fatigue  HPI Sarah Garrison is a 36 y.o. female with a PMH of Diabetes, hypertension, and asthma that presents with a 6 day history of flulike symptoms. Presented to the emergency department 4 days ago for the same. Diagnosed with influenza-like illness and prescribed Tessalon and Deltasone upon discharge. She states she feels the same despite taking medications. Of note she feels extremely fatigued. Has not been checking her blood glucose as directed and has not taken Lantus for 1 month. Has visual blurring but denies polyuria, polydipsia, or abdominal pain. Has some chest tightness with coughing but no chest pain. Also has sinus congestion and copious rhinorrhea. Denies shortness of breath, abdominal pain, nausea, vomiting, fever, chills, rash, or GI/GU symptoms. Has been taking amoxicillin for right-sided otitis media diagnosed 6 days ago with mild resolution of ear pain.   Outpatient Medications Prior to Visit  Medication Sig Dispense Refill  . benzonatate (TESSALON) 100 MG capsule Take 1 capsule (100 mg total) by mouth every 8 (eight) hours. 15 capsule 0  . ibuprofen (ADVIL,MOTRIN) 200 MG tablet Take 400 mg by mouth every 6 (six) hours as needed for headache, mild pain or moderate pain.    . predniSONE (DELTASONE) 20 MG tablet Take 2 tablets (40 mg total) by mouth daily. 10 tablet 0  . Lancets (ACCU-CHEK SOFT TOUCH) lancets Use as instructed for once daily blood glucose testing 100 each 12  . acetaminophen (TYLENOL) 500 MG tablet Take 1,000 mg by mouth every 6 (six) hours as needed for mild pain or moderate pain.    Marland Kitchen. amLODipine (NORVASC) 2.5 MG tablet Take 1 tablet (2.5 mg total) by mouth daily. Reported on 09/29/2015 90 tablet 3  . amoxicillin (AMOXIL) 500 MG capsule Take 1 capsule (500 mg total) by mouth 2 (two) times daily. 14 capsule 0  . naproxen  (NAPROSYN) 500 MG tablet Take 1 tablet (500 mg total) by mouth 2 (two) times daily. (Patient taking differently: Take 500 mg by mouth 2 (two) times daily as needed for moderate pain or headache. ) 30 tablet 0  . ACCU-CHEK SOFTCLIX LANCETS lancets USE AS INSTRUCTED 100 each 12  . glucose blood test strip Use as instructed 100 each 12  . Insulin Glargine (LANTUS SOLOSTAR) 100 UNIT/ML Solostar Pen Inject 45 Units into the skin daily at 10 pm. Discontinue previous dose 15 mL 0   Facility-Administered Medications Prior to Visit  Medication Dose Route Frequency Provider Last Rate Last Dose  . ipratropium-albuterol (DUONEB) 0.5-2.5 (3) MG/3ML nebulizer solution 3 mL  3 mL Nebulization Q6H Dawn Marland Mcalpine Langeland, MD         ROS Review of Systems  Constitutional: Positive for malaise/fatigue. Negative for chills and fever.  HENT: Positive for congestion and ear pain (right ear, resolving). Negative for sinus pain and sore throat.   Eyes: Positive for blurred vision.  Respiratory: Positive for cough and shortness of breath (when coughing).   Cardiovascular: Positive for chest pain (chest tightness aggravated with coughing).  Gastrointestinal: Negative for abdominal pain, nausea and vomiting.  Genitourinary: Negative for dysuria.  Musculoskeletal: Positive for myalgias.  Skin: Negative for rash.  Neurological: Negative for headaches.    Objective:  BP 120/88 (BP Location: Right Arm, Patient Position: Sitting, Cuff Size: Large)   Pulse (!) 110   Temp 98.6 F (37 C)   Resp 20  Wt 231 lb 9.6 oz (105.1 kg)   SpO2 98%   BMI 38.54 kg/m   BP/Weight 07/20/2016 07/16/2016 07/15/2016  Systolic BP 120 152 124  Diastolic BP 88 93 93  Wt. (Lbs) 231.6 - -  BMI 38.54 - -      Physical Exam  Constitutional: No distress.  Appears fatigued, obese  HENT:  Head: Normocephalic and atraumatic.  Turbinates severely hypertrophic, moderately erythematous, with white rhinorrhea. Oropharynx with postnasal drip  and no exudates.  Eyes: No scleral icterus.  Mild irritation of the right ear canal, pain elicited with otoscopic exam  Skin: She is not diaphoretic.     Assessment & Plan:   1. Upper respiratory tract infection, unspecified type  2. Cough - Felt to be sequelae of upper respiratory infection - See meds ordered. Has also been taking Amoxicillin for the past 6 days. Advised to return if necessary.  3. Diabetes mellitus without complication (HCC) - Noncompliant - DM uncontrolled, off Lantus x1 month. - CBG in clinic today 342, 346, 327 at approximately 0 minutes, 10 minutes, and 20 minutes respectively. Given 10 units of Novolog. - Patient unable to provide a urine sample - Follow up in 4 days. Bring glucose log. - Advised to go to emergency room if symptoms persist or worsen.  Meds ordered this encounter  Medications  . phenylephrine (NEO-SYNEPHRINE) 1 % nasal spray    Sig: Place 1 drop into both nostrils every 6 (six) hours as needed for congestion.    Dispense:  30 mL    Refill:  0    Order Specific Question:   Supervising Provider    Answer:   Quentin Angst [id number]  . DM-APAP-CPM (CORICIDIN HBP FLU) 15-500-2 MG TABS    Sig: Take 2 tablets by mouth every 6 (six) hours.    Dispense:  1 each    Refill:  0    Order Specific Question:   Supervising Provider    Answer:   Quentin Angst [id number]  . albuterol (PROVENTIL HFA;VENTOLIN HFA) 108 (90 Base) MCG/ACT inhaler    Sig: Inhale 2 puffs into the lungs every 6 (six) hours as needed for wheezing or shortness of breath.    Dispense:  1 Inhaler    Refill:  2    Order Specific Question:   Supervising Provider    Answer:   Quentin Angst [id number]  . sodium chloride (OCEAN) 0.65 % SOLN nasal spray    Sig: Place 1 spray into both nostrils as needed for congestion.    Dispense:  1 Bottle    Refill:  0    Order Specific Question:   Supervising Provider    Answer:   Quentin Angst [id number]  . Insulin  Glargine (LANTUS SOLOSTAR) 100 UNIT/ML Solostar Pen    Sig: Inject 50 Units into the skin daily at 10 pm.    Dispense:  5 pen    Refill:  2    Order Specific Question:   Supervising Provider    Answer:   Quentin Angst [id number]  . glucose blood test strip    Sig: Use as instructed    Dispense:  100 each    Refill:  12    Order Specific Question:   Supervising Provider    Answer:   Quentin Angst [[id number]]  . ACCU-CHEK SOFTCLIX LANCETS lancets    Sig: Use three times a day    Dispense:  100 each    Refill:  12  Order Specific Question:   Supervising Provider    Answer:   Quentin Angst [[id number]]    Follow-up: Return in about 4 days (around 07/24/2016) for DM f/u.   Loletta Specter PA

## 2016-07-20 NOTE — Progress Notes (Signed)
Patient here today for Depo Provera injection.  Depo given today 07/20/16.  Site unremarkable & patient tolerated injection.  Next injection due May 4-May 18.  Reminder card given. Guy Francoravia Marygrace Sandoval, RN

## 2016-07-27 ENCOUNTER — Encounter: Payer: Self-pay | Admitting: Physician Assistant

## 2016-07-27 ENCOUNTER — Ambulatory Visit (HOSPITAL_COMMUNITY)
Admission: EM | Admit: 2016-07-27 | Discharge: 2016-07-27 | Disposition: A | Discharge status: Home / Self Care | Payer: Medicaid Other | Attending: Family Medicine | Admitting: Family Medicine

## 2016-07-27 ENCOUNTER — Encounter (HOSPITAL_COMMUNITY): Payer: Self-pay | Admitting: Family Medicine

## 2016-07-27 ENCOUNTER — Ambulatory Visit: Payer: Medicaid Other | Attending: Physician Assistant | Admitting: Physician Assistant

## 2016-07-27 VITALS — BP 137/96 | HR 126 | Temp 98.4°F | Resp 16 | Ht 64.0 in | Wt 231.8 lb

## 2016-07-27 DIAGNOSIS — L02412 Cutaneous abscess of left axilla: Secondary | ICD-10-CM | POA: Diagnosis not present

## 2016-07-27 DIAGNOSIS — R059 Cough, unspecified: Secondary | ICD-10-CM

## 2016-07-27 DIAGNOSIS — L732 Hidradenitis suppurativa: Secondary | ICD-10-CM | POA: Insufficient documentation

## 2016-07-27 DIAGNOSIS — Z79899 Other long term (current) drug therapy: Secondary | ICD-10-CM | POA: Diagnosis not present

## 2016-07-27 DIAGNOSIS — E119 Type 2 diabetes mellitus without complications: Secondary | ICD-10-CM | POA: Diagnosis not present

## 2016-07-27 DIAGNOSIS — R05 Cough: Secondary | ICD-10-CM

## 2016-07-27 DIAGNOSIS — Z794 Long term (current) use of insulin: Secondary | ICD-10-CM | POA: Insufficient documentation

## 2016-07-27 MED ORDER — DOXYCYCLINE HYCLATE 100 MG PO CAPS: 100.0000 mg | ORAL_CAPSULE | Freq: Two times a day (BID) | ORAL | 0 refills | Status: DC

## 2016-07-27 MED ORDER — INSULIN PEN NEEDLE 31G X 8 MM MISC: 1.0000 | Freq: Every day | 5 refills | Status: DC

## 2016-07-27 MED ORDER — LISINOPRIL 10 MG PO TABS: 10.0000 mg | ORAL_TABLET | Freq: Every day | ORAL | 3 refills | Status: DC

## 2016-07-27 MED ORDER — GLIMEPIRIDE 2 MG PO TABS: 2.0000 mg | ORAL_TABLET | Freq: Every day | ORAL | 3 refills | Status: DC

## 2016-07-27 MED ORDER — INSULIN GLARGINE 100 UNIT/ML SOLOSTAR PEN: 60.0000 [IU] | PEN_INJECTOR | Freq: Every day | SUBCUTANEOUS | 2 refills | Status: DC

## 2016-07-27 MED FILL — LISINOPRIL 10 MG TABLET: 10 | 30 days supply | Qty: 30 | Fill #0

## 2016-07-27 MED FILL — DOXYCYCLINE 100 MG TABLET: 100 | 10 days supply | Qty: 20 | Fill #0

## 2016-07-27 MED FILL — LANTUS SOLOSTAR 100 UNITS/M: 100 | 30 days supply | Qty: 18 | Fill #0

## 2016-07-27 MED FILL — TRUEPLUS PEN NDL 32GX5/32": 32G X 4 MM | 25 days supply | Qty: 100 | Fill #0

## 2016-07-27 MED FILL — TRUEPLUS PEN NDL 32GX5/32: 32G X 4 MM | 25 days supply | Qty: 100 | Fill #0

## 2016-07-27 NOTE — Patient Instructions (Signed)
Please go to the Urgent Care for treatment of the abscess in your left armpit. Please pay close attention to the following instruction for your Lantus adjustment; Increase by one unit every two days until you reach 60 units nightly. Stay at 60 units and continue to monitor your blood sugar. Diabetes follow up in 4 weeks.    Incision and Drainage Incision and drainage is a surgical procedure to open and drain a fluid-filled sac. The sac may be filled with pus, mucus, or blood. Examples of fluid-filled sacs that may need surgical drainage include cysts, skin infections (abscesses), and red lumps that develop from a ruptured cyst or a small abscess (boils). You may need this procedure if the affected area is large, painful, infected, or not healing well. Tell a health care provider about:  Any allergies you have.  All medicines you are taking, including vitamins, herbs, eye drops, creams, and over-the-counter medicines.  Any problems you or family members have had with anesthetic medicines.  Any blood disorders you have.  Any surgeries you have had.  Any medical conditions you have.  Whether you are pregnant or may be pregnant. What are the risks? Generally, this is a safe procedure. However, problems may occur, including:  Infection.  Bleeding.  Allergic reactions to medicines.  Scarring. What happens before the procedure?  You may need an ultrasound or other imaging tests to see how large or deep the fluid-filled sac is.  You may have blood tests to check for infection.  You may get a tetanus shot.  You may be given antibiotic medicine to help prevent infection.  Follow instructions from your health care provider about eating or drinking restrictions.  Ask your health care provider about:  Changing or stopping your regular medicines. This is especially important if you are taking diabetes medicines or blood thinners.  Taking medicines such as aspirin and ibuprofen. These  medicines can thin your blood. Do not take these medicines before your procedure if your health care provider instructs you not to.  Plan to have someone take you home after the procedure.  If you will be going home right after the procedure, plan to have someone stay with you for 24 hours. What happens during the procedure?  To reduce your risk of infection:  Your health care team will wash or sanitize their hands.  Your skin will be washed with soap.  You will be given one or more of the following:  A medicine to help you relax (sedative).  A medicine to numb the area (local anesthetic).  A medicine to make you fall asleep (general anesthetic).  An incision will be made in the top of the fluid-filled sac.  The contents of the sac may be squeezed out, or a syringe or tube (catheter)may be used to empty the sac.  The catheter may be left in place for several weeks to drain any fluid. Or, your health care provider may stitch open the edges of the incision to make a long-term opening for drainage (marsupialization).  The inside of the sac may be washed out (irrigated) with a sterile solution and packed with gauze before it is covered with a bandage (dressing). The procedure may vary among health care providers and hospitals. What happens after the procedure?  Your blood pressure, heart rate, breathing rate, and blood oxygen level will be monitored often until the medicines you were given have worn off.  Do not drive for 24 hours if you received a sedative. This  information is not intended to replace advice given to you by your health care provider. Make sure you discuss any questions you have with your health care provider. Document Released: 11/14/2000 Document Revised: 10/27/2015 Document Reviewed: 03/11/2015 Elsevier Interactive Patient Education  2017 [employer].

## 2016-07-27 NOTE — Progress Notes (Signed)
Subjective:  Patient ID: Sarah Garrison, female    DOB: [birth year]  Age: 36 y.o. MRN: [id number]  CC: follow up diabetes  HPI Sarah Garrison is a 36 y.o. female with a PMH of DM2 presents to follow up on diabetes. She was recently started on insulin again. She has currently been taking Lantus 45 units at night. Needs a refill of the Lantus needles. Reports a low of 349 and high of 549. Has polydipsia, polyuria, and some fatigue. Reports less visual blurring. She is concerned of a painful abscess under the left axilla. Denies chest pain, shortness of breath, headache, abdominal pain, fever, rash, nausea, vomiting, rash, or GI symptoms.    Outpatient Medications Prior to Visit  Medication Sig Dispense Refill  . ACCU-CHEK SOFTCLIX LANCETS lancets Use three times a day 100 each 12  . acetaminophen (TYLENOL) 500 MG tablet Take 1,000 mg by mouth every 6 (six) hours as needed for mild pain or moderate pain.    Marland Kitchen. albuterol (PROVENTIL HFA;VENTOLIN HFA) 108 (90 Base) MCG/ACT inhaler Inhale 2 puffs into the lungs every 6 (six) hours as needed for wheezing or shortness of breath. 1 Inhaler 2  . amLODipine (NORVASC) 2.5 MG tablet Take 1 tablet (2.5 mg total) by mouth daily. Reported on 09/29/2015 90 tablet 3  . amoxicillin (AMOXIL) 500 MG capsule Take 1 capsule (500 mg total) by mouth 2 (two) times daily. 14 capsule 0  . benzonatate (TESSALON) 100 MG capsule Take 1 capsule (100 mg total) by mouth every 8 (eight) hours. 15 capsule 0  . glucose blood test strip Use as instructed 100 each 12  . ibuprofen (ADVIL,MOTRIN) 200 MG tablet Take 400 mg by mouth every 6 (six) hours as needed for headache, mild pain or moderate pain.    . naproxen (NAPROSYN) 500 MG tablet Take 1 tablet (500 mg total) by mouth 2 (two) times daily. (Patient taking differently: Take 500 mg by mouth 2 (two) times daily as needed for moderate pain or headache. ) 30 tablet 0  . neomycin-colistin-hydrocortisone-thonzonium  (CORTISPORIN-TC) 3.08-04-08-0.5 MG/ML otic suspension Place 4 drops into the right ear 4 (four) times daily. 10 mL 0  . phenylephrine (NEO-SYNEPHRINE) 1 % nasal spray Place 1 drop into both nostrils every 6 (six) hours as needed for congestion. 30 mL 0  . predniSONE (DELTASONE) 20 MG tablet Take 2 tablets (40 mg total) by mouth daily. 10 tablet 0  . sodium chloride (OCEAN) 0.65 % SOLN nasal spray Place 1 spray into both nostrils as needed for congestion. 1 Bottle 0  . Insulin Glargine (LANTUS SOLOSTAR) 100 UNIT/ML Solostar Pen Inject 50 Units into the skin daily at 10 pm. 5 pen 2   Facility-Administered Medications Prior to Visit  Medication Dose Route Frequency Provider Last Rate Last Dose  . insulin aspart (novoLOG) injection 10 Units  10 Units Subcutaneous Once Loletta Specteroger David Senai Kingsley, PA      . ipratropium-albuterol (DUONEB) 0.5-2.5 (3) MG/3ML nebulizer solution 3 mL  3 mL Nebulization Q6H Pete Glatterawn T Langeland, MD         ROS Review of Systems  Constitutional: Negative for chills, fever and malaise/fatigue.  Eyes: Positive for blurred vision.  Respiratory: Negative for shortness of breath.   Cardiovascular: Negative for chest pain and palpitations.  Gastrointestinal: Negative for abdominal pain and nausea.  Genitourinary: Negative for dysuria and hematuria.  Musculoskeletal: Negative for joint pain and myalgias.  Skin:       Abscess left axilla  Neurological: Negative for  tingling and headaches.  Psychiatric/Behavioral: Negative for depression. The patient is not nervous/anxious.     Objective:  BP (!) 137/96 (BP Location: Left Arm, Patient Position: Sitting, Cuff Size: Normal)   Pulse (!) 126   Temp 98.4 F (36.9 C) (Oral)   Resp 16   Ht 5\' 4"  (1.626 m)   Wt 231 lb 12.8 oz (105.1 kg)   SpO2 96%   BMI 39.79 kg/m   BP/Weight 07/27/2016 07/27/2016 07/20/2016  Systolic BP 142 137 120  Diastolic BP 97 96 88  Wt. (Lbs) - 231.8 231.6  BMI - 39.79 38.54      Physical Exam    Constitutional: She is oriented to person, place, and time.  NAD, obese, polite  HENT:  Head: Normocephalic and atraumatic.  Eyes: No scleral icterus.  Neck: No thyromegaly present.  Cardiovascular: Normal rate, regular rhythm and normal heart sounds.   Pulmonary/Chest: Effort normal and breath sounds normal.  Musculoskeletal: She exhibits no edema.  Neurological: She is alert and oriented to person, place, and time.  Skin:  Tender as cysts on the left axilla with increased warmth, mild induration, no suppuration, and no bleeding.     Assessment & Plan:   1. Hydradenitis -Advised to go to urgent care for fastest tx. Can not book another time slot today.  2. Cough -Resolving  3. Diabetes mellitus without complication (HCC) -Patient will be increased to 60 units daily at bedtime of Lantus. I have instructed her on how to up titrate. -Glimepiride 2 mg daily - Insulin Glargine (LANTUS SOLOSTAR) 100 UNIT/ML Solostar Pen; Inject 60 Units into the skin daily at 10 pm.  Dispense: 6 pen; Refill: 2   Meds ordered this encounter  Medications  . Insulin Pen Needle 31G X 8 MM MISC    Sig: 1 Package by Does not apply route daily.    Dispense:  30 each    Refill:  5    Order Specific Question:   Supervising Provider    Answer:   Quentin Angst [id number]  . lisinopril (PRINIVIL,ZESTRIL) 10 MG tablet    Sig: Take 1 tablet (10 mg total) by mouth daily.    Dispense:  90 tablet    Refill:  3    Order Specific Question:   Supervising Provider    Answer:   Quentin Angst [id number]  . glimepiride (AMARYL) 2 MG tablet    Sig: Take 1 tablet (2 mg total) by mouth daily before breakfast.    Dispense:  30 tablet    Refill:  3    Order Specific Question:   Supervising Provider    Answer:   Quentin Angst [id number]  . Insulin Glargine (LANTUS SOLOSTAR) 100 UNIT/ML Solostar Pen    Sig: Inject 60 Units into the skin daily at 10 pm.    Dispense:  6 pen    Refill:  2    Order  Specific Question:   Supervising Provider    Answer:   Quentin Angst [[id number]]    Follow-up: Return in about 4 weeks (around 08/24/2016) for diabetes follow up.Loletta Specter PA

## 2016-07-27 NOTE — ED Triage Notes (Signed)
Pt here for abscess to left axilla area x 2 days. Sts very painful and using warm compresses at home.

## 2016-07-27 NOTE — Discharge Instructions (Signed)
Change dressing every day.  Wound should heal within a week.  Follow up as necessary

## 2016-07-27 NOTE — ED Provider Notes (Signed)
CSN: [id number]     Arrival date & time 07/27/16  1004 History   First MD Initiated Contact with Patient 07/27/16 1040     Chief Complaint  Patient presents with  . Abscess   (Consider location/radiation/quality/duration/timing/severity/associated sxs/prior Treatment) Patient has abscess in left axilla for last 2 weeks and it is very painful.   The history is provided by the patient.  Abscess  Location:  Shoulder/arm Shoulder/arm abscess location:  L axilla Size:  4cm Abscess quality: fluctuance, painful and redness   Red streaking: no   Duration:  2 weeks Progression:  Unchanged Pain details:    Quality:  Throbbing   Severity:  Severe   Duration:  2 weeks   Timing:  Constant   Progression:  Waxing and waning Chronicity:  New Relieved by:  Nothing Worsened by:  Nothing Ineffective treatments:  None tried   Past Medical History:  Diagnosis Date  . Asthma   . Diabetes mellitus without complication (HCC)   . GERD (gastroesophageal reflux disease)   . Hypertension    Past Surgical History:  Procedure Laterality Date  . CESAREAN SECTION    . TUBAL LIGATION    . WISDOM TOOTH EXTRACTION     Family History  Problem Relation Age of Onset  . Asthma Mother   . Asthma Father    Social History  Substance Use Topics  . Smoking status: Former Smoker    Packs/day: 0.00    Types: Cigarettes    Quit date: 11/02/2013  . Smokeless tobacco: Never Used  . Alcohol use No   OB History    Gravida Para Term Preterm AB Living   5 4     1 4    SAB TAB Ectopic Multiple Live Births   1             Review of Systems  Constitutional: Negative.   HENT: Negative.   Eyes: Negative.   Respiratory: Negative.   Cardiovascular: Negative.   Gastrointestinal: Negative.   Endocrine: Negative.   Genitourinary: Negative.   Musculoskeletal: Negative.   Skin: Positive for wound.  Allergic/Immunologic: Negative.   Neurological: Negative.   Hematological: Negative.    Psychiatric/Behavioral: Negative.     Allergies  Morphine and related; Metformin and related; and Glyburide  Home Medications   Prior to Admission medications   Medication Sig Start Date End Date Taking? Authorizing Provider  ACCU-CHEK SOFTCLIX LANCETS lancets Use three times a day 07/20/16   Loletta Specter, PA  acetaminophen (TYLENOL) 500 MG tablet Take 1,000 mg by mouth every 6 (six) hours as needed for mild pain or moderate pain.    Historical Provider, MD  albuterol (PROVENTIL HFA;VENTOLIN HFA) 108 (90 Base) MCG/ACT inhaler Inhale 2 puffs into the lungs every 6 (six) hours as needed for wheezing or shortness of breath. 07/20/16   Loletta Specter, PA  amLODipine (NORVASC) 2.5 MG tablet Take 1 tablet (2.5 mg total) by mouth daily. Reported on 09/29/2015 02/01/16   Pete Glatter, MD  amoxicillin (AMOXIL) 500 MG capsule Take 1 capsule (500 mg total) by mouth 2 (two) times daily. 07/15/16   Chase Picket Ward, PA-C  benzonatate (TESSALON) 100 MG capsule Take 1 capsule (100 mg total) by mouth every 8 (eight) hours. 07/16/16   Renne Crigler, PA-C  doxycycline (VIBRAMYCIN) 100 MG capsule Take 1 capsule (100 mg total) by mouth 2 (two) times daily. 07/27/16   Deatra Canter, FNP  glimepiride (AMARYL) 2 MG tablet Take 1 tablet (2 mg  total) by mouth daily before breakfast. 07/27/16   Loletta Specter, PA  glucose blood test strip Use as instructed 07/20/16   Loletta Specter, PA  ibuprofen (ADVIL,MOTRIN) 200 MG tablet Take 400 mg by mouth every 6 (six) hours as needed for headache, mild pain or moderate pain.    Historical Provider, MD  Insulin Glargine (LANTUS SOLOSTAR) 100 UNIT/ML Solostar Pen Inject 60 Units into the skin daily at 10 pm. 07/27/16   Loletta Specter, PA  Insulin Pen Needle 31G X 8 MM MISC 1 Package by Does not apply route daily. 07/27/16   Loletta Specter, PA  lisinopril (PRINIVIL,ZESTRIL) 10 MG tablet Take 1 tablet (10 mg total) by mouth daily. 07/27/16   Loletta Specter, PA   naproxen (NAPROSYN) 500 MG tablet Take 1 tablet (500 mg total) by mouth 2 (two) times daily. Patient taking differently: Take 500 mg by mouth 2 (two) times daily as needed for moderate pain or headache.  02/09/16   April Palumbo, MD  neomycin-colistin-hydrocortisone-thonzonium (CORTISPORIN-TC) 3.08-04-08-0.5 MG/ML otic suspension Place 4 drops into the right ear 4 (four) times daily. 07/20/16   Loletta Specter, PA  phenylephrine (NEO-SYNEPHRINE) 1 % nasal spray Place 1 drop into both nostrils every 6 (six) hours as needed for congestion. 07/20/16   Loletta Specter, PA  predniSONE (DELTASONE) 20 MG tablet Take 2 tablets (40 mg total) by mouth daily. 07/16/16   Renne Crigler, PA-C  sodium chloride (OCEAN) 0.65 % SOLN nasal spray Place 1 spray into both nostrils as needed for congestion. 07/20/16   Loletta Specter, PA   Meds Ordered and Administered this Visit  Medications - No data to display  BP 142/97   Pulse (!) 123   Temp 98.6 F (37 C)   Resp 18   SpO2 100%  No data found.   Physical Exam  Constitutional: She appears well-developed and well-nourished.  HENT:  Head: Normocephalic and atraumatic.  Right Ear: External ear normal.  Left Ear: External ear normal.  Mouth/Throat: Oropharynx is clear and moist.  Eyes: Conjunctivae and EOM are normal. Pupils are equal, round, and reactive to light.  Neck: Normal range of motion. Neck supple.  Cardiovascular: Normal rate, regular rhythm and normal heart sounds.   Pulmonary/Chest: Effort normal and breath sounds normal.  Abdominal: Soft.  Skin:  Abscess left axilla TTP and approx 4 cm in diameter.  Nursing note and vitals reviewed.   Urgent Care Course     .Marland KitchenIncision and Drainage Date/Time: 07/27/2016 11:34 AM Performed by: Deatra Canter Authorized by: Bradd Canary D   Consent:    Consent obtained:  Verbal   Consent given by:  Patient   Risks discussed:  Bleeding, incomplete drainage, pain, infection and damage to other  organs   Alternatives discussed:  No treatment Location:    Type:  Abscess   Size:  4 cm   Location:  Upper extremity   Upper extremity location:  Arm (Left axilla)   Arm location:  L upper arm Pre-procedure details:    Skin preparation:  Betadine Anesthesia (see MAR for exact dosages):    Anesthesia method:  Local infiltration   Local anesthetic:  Lidocaine 2% WITH epi Procedure type:    Complexity:  Simple Procedure details:    Needle aspiration: no     Incision types:  Stab incision   Incision depth:  Dermal   Scalpel blade:  11   Wound management:  Probed and deloculated   Drainage:  Bloody, purulent and serosanguinous   Drainage amount:  Moderate   Wound treatment:  Wound left open   Packing materials:  None Post-procedure details:    Patient tolerance of procedure:  Tolerated well, no immediate complications Comments:     Bulky 4x4 Dressing applied.   (including critical care time)  Labs Review Labs Reviewed - No data to display  Imaging Review No results found.   Visual Acuity Review  Right Eye Distance:   Left Eye Distance:   Bilateral Distance:    Right Eye Near:   Left Eye Near:    Bilateral Near:         MDM   1. Abscess of left axilla    Incision and drainage  Doxycycline 100mg  one po bid x10 days  Bulky dressing applied and change qd  Follow up prn.    Deatra CanterWilliam J Anacristina Steffek, FNP 07/27/16 1136

## 2016-07-29 ENCOUNTER — Encounter (HOSPITAL_COMMUNITY): Payer: Self-pay | Admitting: Emergency Medicine

## 2016-07-29 ENCOUNTER — Emergency Department (HOSPITAL_COMMUNITY)
Admission: EM | Admit: 2016-07-29 | Discharge: 2016-07-29 | Disposition: A | Discharge status: Home / Self Care | Payer: Medicaid Other | Attending: Emergency Medicine | Admitting: Emergency Medicine

## 2016-07-29 DIAGNOSIS — L02412 Cutaneous abscess of left axilla: Secondary | ICD-10-CM

## 2016-07-29 DIAGNOSIS — Z79899 Other long term (current) drug therapy: Secondary | ICD-10-CM | POA: Insufficient documentation

## 2016-07-29 DIAGNOSIS — J45909 Unspecified asthma, uncomplicated: Secondary | ICD-10-CM | POA: Insufficient documentation

## 2016-07-29 DIAGNOSIS — E1140 Type 2 diabetes mellitus with diabetic neuropathy, unspecified: Secondary | ICD-10-CM | POA: Insufficient documentation

## 2016-07-29 DIAGNOSIS — Z794 Long term (current) use of insulin: Secondary | ICD-10-CM | POA: Diagnosis not present

## 2016-07-29 DIAGNOSIS — R739 Hyperglycemia, unspecified: Secondary | ICD-10-CM

## 2016-07-29 DIAGNOSIS — I10 Essential (primary) hypertension: Secondary | ICD-10-CM | POA: Diagnosis not present

## 2016-07-29 DIAGNOSIS — Z87891 Personal history of nicotine dependence: Secondary | ICD-10-CM | POA: Diagnosis not present

## 2016-07-29 DIAGNOSIS — E1165 Type 2 diabetes mellitus with hyperglycemia: Secondary | ICD-10-CM | POA: Diagnosis not present

## 2016-07-29 DIAGNOSIS — E119 Type 2 diabetes mellitus without complications: Secondary | ICD-10-CM

## 2016-07-29 LAB — BASIC METABOLIC PANEL
Anion gap: 10 (ref 5–15)
BUN: 8 mg/dL (ref 6–20)
CHLORIDE: 104 mmol/L (ref 101–111)
CO2: 23 mmol/L (ref 22–32)
CREATININE: 0.73 mg/dL (ref 0.44–1.00)
Calcium: 9 mg/dL (ref 8.9–10.3)
GFR calc non Af Amer: >60 mL/min (ref >60)
Glucose, Bld: 443 mg/dL — ABNORMAL HIGH (ref 65–99)
Potassium: 3.4 mmol/L — ABNORMAL LOW (ref 3.5–5.1)
Sodium: 137 mmol/L (ref 135–145)

## 2016-07-29 LAB — CBC WITH DIFFERENTIAL/PLATELET
Basophils Absolute: 0 10*3/uL (ref 0.0–0.1)
Basophils Relative: 0 %
Eosinophils Absolute: 0.1 10*3/uL (ref 0.0–0.7)
Eosinophils Relative: 1 %
HEMATOCRIT: 38.9 % (ref 36.0–46.0)
HEMOGLOBIN: 13.8 g/dL (ref 12.0–15.0)
LYMPHS ABS: 3 10*3/uL (ref 0.7–4.0)
LYMPHS PCT: 42 %
MCH: 29.8 pg (ref 26.0–34.0)
MCHC: 35.5 g/dL (ref 30.0–36.0)
MCV: 84 fL (ref 78.0–100.0)
MONOS PCT: 8 %
Monocytes Absolute: 0.6 10*3/uL (ref 0.1–1.0)
NEUTROS PCT: 49 %
Neutro Abs: 3.4 10*3/uL (ref 1.7–7.7)
Platelets: 275 10*3/uL (ref 150–400)
RBC: 4.63 MIL/uL (ref 3.87–5.11)
RDW: 12.4 % (ref 11.5–15.5)
WBC: 7 10*3/uL (ref 4.0–10.5)

## 2016-07-29 LAB — CBG MONITORING, ED
GLUCOSE-CAPILLARY: 474 mg/dL — AB (ref 65–99)
GLUCOSE-CAPILLARY: 286 mg/dL — AB (ref 65–99)

## 2016-07-29 MED ORDER — ACCU-CHEK SOFTCLIX LANCETS MISC: 12 refills | Status: DC

## 2016-07-29 MED ORDER — LIDOCAINE-EPINEPHRINE (PF) 2 %-1:200000 IJ SOLN
10.0000 mL | Freq: Once | INTRAMUSCULAR | Status: AC
  Administered 2016-07-29: 10 mL
  Filled 2016-07-29: qty 20

## 2016-07-29 MED ORDER — INSULIN ASPART 100 UNIT/ML ~~LOC~~ SOLN
5.0000 [IU] | Freq: Once | SUBCUTANEOUS | Status: AC
  Administered 2016-07-29: 5 [IU] via SUBCUTANEOUS
  Filled 2016-07-29: qty 1

## 2016-07-29 MED ORDER — INSULIN GLARGINE 100 UNIT/ML SOLOSTAR PEN: 60.0000 [IU] | PEN_INJECTOR | Freq: Every day | SUBCUTANEOUS | 0 refills | Status: DC

## 2016-07-29 MED ORDER — SODIUM CHLORIDE 0.9 % IV BOLUS (SEPSIS)
1000.0000 mL | Freq: Once | INTRAVENOUS | Status: AC
  Administered 2016-07-29: 1000 mL via INTRAVENOUS

## 2016-07-29 MED ORDER — FENTANYL CITRATE (PF) 100 MCG/2ML IJ SOLN
50.0000 ug | Freq: Once | INTRAMUSCULAR | Status: AC
  Administered 2016-07-29: 50 ug via INTRAVENOUS
  Filled 2016-07-29: qty 2

## 2016-07-29 MED ORDER — INSULIN PEN NEEDLE 31G X 8 MM MISC: 1.0000 | Freq: Every day | 0 refills | Status: DC

## 2016-07-29 MED ORDER — IBUPROFEN 600 MG PO TABS: 600.0000 mg | ORAL_TABLET | Freq: Three times a day (TID) | ORAL | 0 refills | Status: DC | PRN

## 2016-07-29 NOTE — ED Provider Notes (Signed)
[facility]-EMERGENCY DEPT Provider Note   CSN: [id number] Arrival date & time: 07/29/16  0542     History   Chief Complaint Chief Complaint  Patient presents with  . Abscess    HPI Sarah Garrison is a 36 y.o. female.  HPI   Pt with hx DM, HTN, asthma, gerd with recent I&D for left axillary abscess (07/27/16) at urgent care p/w worsening pain and swelling.  Has had some bloody drainage but no other drainage since the I&D. Is taking doxycycline.  Is having fevers to 100, chills, myalgias, increased pain.  Blood sugars have been in the 400s/500s.  Recently diagnosed with influenza, recovering well from that - still has residual cough but no SOB, CP.    Past Medical History:  Diagnosis Date  . Asthma   . Diabetes mellitus without complication (HCC)   . GERD (gastroesophageal reflux disease)   . Hypertension     Patient Active Problem List   Diagnosis Date Noted  . Uses depot medroxyprogesterone acetate as primary birth control method 05/07/2016  . Encounter for pregnancy test 02/01/2016  . Leg swelling 09/29/2015  . Morbid obesity (HCC) 09/29/2015  . DM neuropathy, type II diabetes mellitus (HCC) 08/24/2015    Past Surgical History:  Procedure Laterality Date  . CESAREAN SECTION    . TUBAL LIGATION    . WISDOM TOOTH EXTRACTION      OB History    Gravida Para Term Preterm AB Living   5 4     1 4    SAB TAB Ectopic Multiple Live Births   1               Home Medications    Prior to Admission medications   Medication Sig Start Date End Date Taking? Authorizing Provider  acetaminophen (TYLENOL) 500 MG tablet Take 1,000 mg by mouth every 6 (six) hours as needed for mild pain or moderate pain.   Yes Historical Provider, MD  albuterol (PROVENTIL HFA;VENTOLIN HFA) 108 (90 Base) MCG/ACT inhaler Inhale 2 puffs into the lungs every 6 (six) hours as needed for wheezing or shortness of breath. 07/20/16  Yes Roger Mariana Arnavid Gomez, PA  benzonatate (TESSALON) 100 MG capsule Take 1  capsule (100 mg total) by mouth every 8 (eight) hours. 07/16/16  Yes Renne CriglerJoshua Geiple, PA-C  doxycycline (VIBRAMYCIN) 100 MG capsule Take 1 capsule (100 mg total) by mouth 2 (two) times daily. 07/27/16  Yes Deatra CanterWilliam J Oxford, FNP  naproxen (NAPROSYN) 500 MG tablet Take 1 tablet (500 mg total) by mouth 2 (two) times daily. 02/09/16  Yes April Palumbo, MD  phenylephrine (NEO-SYNEPHRINE) 1 % nasal spray Place 1 drop into both nostrils every 6 (six) hours as needed for congestion. 07/20/16  Yes Roger Mariana Arnavid Gomez, PA  sodium chloride (OCEAN) 0.65 % SOLN nasal spray Place 1 spray into both nostrils as needed for congestion. 07/20/16  Yes Roger Mariana Arnavid Gomez, PA  ACC[facility]K [facility] Center[facility] LANCETS lancets Use three times a day 07/29/16   Trixie DredgeEmily Akshaya Toepfer, PA-C  amLODipine (NORVASC) 2.5 MG tablet Take 1 tablet (2.5 mg total) by mouth daily. Reported on 09/29/2015 Patient not taking: Reported on 07/29/2016 02/01/16   Pete Glatterawn T Langeland, MD  glimepiride (AMARYL) 2 MG tablet Take 1 tablet (2 mg total) by mouth daily before breakfast. Patient not taking: Reported on 07/29/2016 07/27/16   Loletta Specteroger David Gomez, PA  glucose blood test strip Use as instructed 07/20/16   Loletta Specteroger David Gomez, PA  ibuprofen (ADVIL,MOTRIN) 600 MG tablet Take 1 tablet (600  mg total) by mouth every 8 (eight) hours as needed. 07/29/16   Trixie Dredge, PA-C  Insulin Glargine (LANTUS SOLOSTAR) 100 UNIT/ML Solostar Pen Inject 60 Units into the skin daily at 10 pm. 07/29/16   Trixie Dredge, PA-C  Insulin Pen Needle 31G X 8 MM MISC 1 Package by Does not apply route daily. 07/29/16   Trixie Dredge, PA-C  lisinopril (PRINIVIL,ZESTRIL) 10 MG tablet Take 1 tablet (10 mg total) by mouth daily. 07/27/16   Loletta Specter, PA  neomycin-colistin-hydrocortisone-thonzonium (CORTISPORIN-TC) 3.08-04-08-0.5 MG/ML otic suspension Place 4 drops into the right ear 4 (four) times daily. Patient not taking: Reported on 07/29/2016 07/20/16   Loletta Specter, PA  predniSONE (DELTASONE) 20 MG tablet Take 2 tablets  (40 mg total) by mouth daily. Patient not taking: Reported on 07/29/2016 07/16/16   Renne Crigler, PA-C    Family History Family History  Problem Relation Age of Onset  . Asthma Mother   . Asthma Father     Social History Social History  Substance Use Topics  . Smoking status: Former Smoker    Packs/day: 0.00    Types: Cigarettes    Quit date: 11/02/2013  . Smokeless tobacco: Never Used  . Alcohol use No     Allergies   Morphine and related; Metformin and related; and Glyburide   Review of Systems Review of Systems  All other systems reviewed and are negative.    Physical Exam Updated Vital Signs BP 122/88 (BP Location: Right Arm) Comment: Simultaneous filing. User may not have seen previous data.  Pulse 86 Comment: Simultaneous filing. User may not have seen previous data.  Temp 98.4 F (36.9 C)   Resp 16   Ht 5\' 4"  (1.626 m)   Wt 105 kg   SpO2 100% Comment: Simultaneous filing. User may not have seen previous data.  BMI 39.72 kg/m   Physical Exam  Constitutional: She appears well-developed and well-nourished. No distress.  HENT:  Head: Normocephalic and atraumatic.  Neck: Neck supple.  Cardiovascular: Normal rate and regular rhythm.   Pulmonary/Chest: Effort normal and breath sounds normal. No respiratory distress. She has no wheezes. She has no rales.  Left axilla with induration and tenderness.  Area of incision is closed underneath with overlying abraded skin only.  No drainage.    Neurological: She is alert.  Skin: She is not diaphoretic.  Nursing note and vitals reviewed.    ED Treatments / Results  Labs (all labs ordered are listed, but only abnormal results are displayed) Labs Reviewed  BASIC METABOLIC PANEL - Abnormal; Notable for the following:       Result Value   Potassium 3.4 (*)    Glucose, Bld 443 (*)    All other components within normal limits  CBG MONITORING, ED - Abnormal; Notable for the following:    Glucose-Capillary 474 (*)     All other components within normal limits  CBG MONITORING, ED - Abnormal; Notable for the following:    Glucose-Capillary 286 (*)    All other components within normal limits  CBC WITH DIFFERENTIAL/PLATELET  CBG MONITORING, ED    EKG  EKG Interpretation None       Radiology No results found.  Procedures Procedures (including critical care time)  EMERGENCY DEPARTMENT US SOFT TISSUE INTERPRETATION "Study: Limited Soft Tissue Ultrasound"  INDICATIONS: Pain Multiple views of the body part were obtained in real-time with a multi-frequency linear probe  PERFORMED BY: Myself IMAGES ARCHIVED?: Yes SIDE:Left BODY PART:Axilla INTERPRETATION:  Small  amount of subcutaneous fluid   INCISION AND DRAINAGE Performed by: Trixie Dredge Consent: Verbal consent obtained. Risks and benefits: risks, benefits and alternatives were discussed Type: abscess  Body area: left axilla  Anesthesia: local infiltration  Incision was made with a scalpel.  Local anesthetic: lidocaine 2% wutg epinephrine  Anesthetic total: 8 ml  Complexity: complex Blunt dissection to break up loculations  Drainage: bloody, cloudy  Drainage amount: small  Flushed with normal saline.   Patient tolerance: Patient tolerated the procedure well with no immediate complications.     Medications Ordered in ED Medications  sodium chloride 0.9 % bolus 1,000 mL (0 mLs Intravenous Stopped 07/29/16 0950)  fentaNYL (SUBLIMAZE) injection 50 mcg (50 mcg Intravenous Given 07/29/16 0754)  lidocaine-EPINEPHrine (XYLOCAINE W/EPI) 2 %-1:200000 (PF) injection 10 mL (10 mLs Infiltration Given 07/29/16 0850)  insulin aspart (novoLOG) injection 5 Units (5 Units Subcutaneous Given 07/29/16 0949)     Initial Impression / Assessment and Plan / ED Course  I have reviewed the triage vital signs and the nursing notes.  Pertinent labs & imaging results that were available during my care of the patient were reviewed by me and  considered in my medical decision making (see chart for details).     Afebrile, nontoxic patient with continued pain from left axillary abscess, previously drained but closed incision.  I&D in ED without significant discharge, though area now open for drainage.  Advised warm compresses, continue doxycycline.   D/C home with equipment needed for insulin injections - pt reports she has been out of her insulin x 1 month until recently, still not treating herself due to not having equipment. PCP follow up.  Discussed result, findings, treatment, and follow up  with patient.  Pt given return precautions.  Pt verbalizes understanding and agrees with plan.       Final Clinical Impressions(s) / ED Diagnoses   Final diagnoses:  Abscess of axilla, left  Hyperglycemia    New Prescriptions Discharge Medication List as of 07/29/2016 11:12 AM       Trixie Dredge, PA-C 07/29/16 1537    Rolland Porter, MD 08/10/16 2341

## 2016-07-29 NOTE — ED Notes (Signed)
[facility] is a [employer] [facility] and has no transport to get back to [employer]. [employer] police dept call to transport patient back to [employer].

## 2016-07-29 NOTE — Discharge Instructions (Signed)
Read the information below.  You may return to the Emergency Department at any time for worsening condition or any new symptoms that concern you.   If you develop redness, swelling, worsening pain, or fevers greater than 100.4, return to the ER immediately for a recheck.    Please monitor your blood sugars closely.  Take your medication as prescribed.

## 2016-07-29 NOTE — ED Triage Notes (Signed)
Patient was her two days ago and had an abscess lanced under left axilla. Patient complaining that it is bigger and it is painful. Patient arm pit does have a hard knot. When it is touched patient is crying due to the pain.

## 2016-07-30 ENCOUNTER — Ambulatory Visit: Payer: Medicaid Other | Attending: Family Medicine | Admitting: Family Medicine

## 2016-07-30 ENCOUNTER — Other Ambulatory Visit: Payer: Self-pay | Admitting: Family Medicine

## 2016-07-30 VITALS — BP 144/91 | HR 94 | Temp 98.7°F | Resp 20 | Wt 230.0 lb

## 2016-07-30 DIAGNOSIS — E1140 Type 2 diabetes mellitus with diabetic neuropathy, unspecified: Secondary | ICD-10-CM | POA: Insufficient documentation

## 2016-07-30 DIAGNOSIS — R05 Cough: Secondary | ICD-10-CM

## 2016-07-30 DIAGNOSIS — H66001 Acute suppurative otitis media without spontaneous rupture of ear drum, right ear: Secondary | ICD-10-CM | POA: Insufficient documentation

## 2016-07-30 DIAGNOSIS — J069 Acute upper respiratory infection, unspecified: Secondary | ICD-10-CM | POA: Diagnosis present

## 2016-07-30 DIAGNOSIS — E119 Type 2 diabetes mellitus without complications: Secondary | ICD-10-CM | POA: Diagnosis not present

## 2016-07-30 DIAGNOSIS — J4541 Moderate persistent asthma with (acute) exacerbation: Secondary | ICD-10-CM | POA: Insufficient documentation

## 2016-07-30 DIAGNOSIS — Z79899 Other long term (current) drug therapy: Secondary | ICD-10-CM | POA: Diagnosis not present

## 2016-07-30 DIAGNOSIS — R059 Cough, unspecified: Secondary | ICD-10-CM

## 2016-07-30 DIAGNOSIS — Z794 Long term (current) use of insulin: Secondary | ICD-10-CM | POA: Insufficient documentation

## 2016-07-30 DIAGNOSIS — R509 Fever, unspecified: Secondary | ICD-10-CM | POA: Diagnosis not present

## 2016-07-30 DIAGNOSIS — Z87898 Personal history of other specified conditions: Secondary | ICD-10-CM | POA: Diagnosis not present

## 2016-07-30 DIAGNOSIS — J45909 Unspecified asthma, uncomplicated: Secondary | ICD-10-CM | POA: Insufficient documentation

## 2016-07-30 LAB — POCT URINALYSIS DIPSTICK
BILIRUBIN UA: NEGATIVE
Glucose, UA: 500
Ketones, UA: NEGATIVE
Leukocytes, UA: NEGATIVE
NITRITE UA: NEGATIVE
PH UA: 6
Protein, UA: NEGATIVE
RBC UA: NEGATIVE
Spec Grav, UA: 1.01
UROBILINOGEN UA: 1

## 2016-07-30 LAB — GLUCOSE, POCT (MANUAL RESULT ENTRY)
POC GLUCOSE: 400 mg/dL — AB (ref 70–99)
POC Glucose: 277 mg/dl — AB (ref 70–99)

## 2016-07-30 MED ORDER — INSULIN GLARGINE 100 UNIT/ML SOLOSTAR PEN: 48.0000 [IU] | PEN_INJECTOR | Freq: Every day | SUBCUTANEOUS | 0 refills | Status: DC

## 2016-07-30 MED ORDER — AMOXICILLIN-POT CLAVULANATE 875-125 MG PO TABS: 1.0000 | ORAL_TABLET | Freq: Two times a day (BID) | ORAL | 0 refills | Status: DC

## 2016-07-30 MED ORDER — FLUTICASONE PROPIONATE HFA 110 MCG/ACT IN AERO: 1.0000 | INHALATION_SPRAY | Freq: Two times a day (BID) | RESPIRATORY_TRACT | 3 refills | Status: DC

## 2016-07-30 MED ORDER — BENZONATATE 100 MG PO CAPS: 100.0000 mg | ORAL_CAPSULE | Freq: Two times a day (BID) | ORAL | 0 refills | Status: DC | PRN

## 2016-07-30 MED ORDER — INSULIN ASPART 100 UNIT/ML FLEXPEN: 10.0000 [IU] | PEN_INJECTOR | Freq: Every day | SUBCUTANEOUS | 11 refills | Status: DC

## 2016-07-30 MED ORDER — MONTELUKAST SODIUM 10 MG PO TABS: 10.0000 mg | ORAL_TABLET | Freq: Every day | ORAL | 3 refills | Status: DC

## 2016-07-30 NOTE — Progress Notes (Signed)
Subjective:  Patient ID: Sarah Garrison, female    DOB: Mar 14, 1981  Age: 36 y.o. MRN: [id number]  CC: URI (cough, body aches)   HPI Sarah Garrison presents for   1. Cough and chest pain: One-month history of symptoms. Reports recent history of flulike symptoms with fever and ED visits. She denies any fevers, chills, or hemoptysis. Reports dry cough and rhinorrhea. She reports history of asthma.  2. DM: Blood sugar found to be elevated this visit. She reports blood sugars averaging in the 400s at home. She reports being without her diabetic medications.  3 Right ear pain: Reports history of ear pain and was prescribed antibiotics. Denies any tinnitus or difficulty hearing, or ear drainage.     Outpatient Medications Prior to Visit  Medication Sig Dispense Refill  . ACCU-CHEK SOFTCLIX LANCETS lancets Use three times a day 100 each 12  . albuterol (PROVENTIL HFA;VENTOLIN HFA) 108 (90 Base) MCG/ACT inhaler Inhale 2 puffs into the lungs every 6 (six) hours as needed for wheezing or shortness of breath. 1 Inhaler 2  . doxycycline (VIBRAMYCIN) 100 MG capsule Take 1 capsule (100 mg total) by mouth 2 (two) times daily. 20 capsule 0  . glucose blood test strip Use as instructed 100 each 12  . ibuprofen (ADVIL,MOTRIN) 600 MG tablet Take 1 tablet (600 mg total) by mouth every 8 (eight) hours as needed. 15 tablet 0  . Insulin Pen Needle 31G X 8 MM MISC 1 Package by Does not apply route daily. 30 each 0  . sodium chloride (OCEAN) 0.65 % SOLN nasal spray Place 1 spray into both nostrils as needed for congestion. 1 Bottle 0  . Insulin Glargine (LANTUS SOLOSTAR) 100 UNIT/ML Solostar Pen Inject 60 Units into the skin daily at 10 pm. 6 pen 0  . acetaminophen (TYLENOL) 500 MG tablet Take 1,000 mg by mouth every 6 (six) hours as needed for mild pain or moderate pain.    Marland Kitchen. amLODipine (NORVASC) 2.5 MG tablet Take 1 tablet (2.5 mg total) by mouth daily. Reported on 09/29/2015 (Patient not taking: Reported  on 07/29/2016) 90 tablet 3  . benzonatate (TESSALON) 100 MG capsule Take 1 capsule (100 mg total) by mouth every 8 (eight) hours. (Patient not taking: Reported on 07/30/2016) 15 capsule 0  . lisinopril (PRINIVIL,ZESTRIL) 10 MG tablet Take 1 tablet (10 mg total) by mouth daily. (Patient not taking: Reported on 07/30/2016) 90 tablet 3  . naproxen (NAPROSYN) 500 MG tablet Take 1 tablet (500 mg total) by mouth 2 (two) times daily. (Patient not taking: Reported on 07/30/2016) 30 tablet 0  . neomycin-colistin-hydrocortisone-thonzonium (CORTISPORIN-TC) 3.08-04-08-0.5 MG/ML otic suspension Place 4 drops into the right ear 4 (four) times daily. (Patient not taking: Reported on 07/29/2016) 10 mL 0  . phenylephrine (NEO-SYNEPHRINE) 1 % nasal spray Place 1 drop into both nostrils every 6 (six) hours as needed for congestion. 30 mL 0  . predniSONE (DELTASONE) 20 MG tablet Take 2 tablets (40 mg total) by mouth daily. (Patient not taking: Reported on 07/29/2016) 10 tablet 0  . glimepiride (AMARYL) 2 MG tablet Take 1 tablet (2 mg total) by mouth daily before breakfast. (Patient not taking: Reported on 07/29/2016) 30 tablet 3   Facility-Administered Medications Prior to Visit  Medication Dose Route Frequency Provider Last Rate Last Dose  . insulin aspart (novoLOG) injection 10 Units  10 Units Subcutaneous Once Loletta Specteroger David Gomez, PA-C      . ipratropium-albuterol (DUONEB) 0.5-2.5 (3) MG/3ML nebulizer solution 3 mL  3 mL Nebulization Q6H Pete Glatter, MD        ROS Review of Systems  Constitutional: Negative.   HENT: Positive for ear pain.   Eyes: Negative.   Respiratory: Positive for cough.   Cardiovascular: Negative.   Gastrointestinal: Negative.   Endocrine: Negative.     Objective:  BP (!) 144/91 (BP Location: Right Arm, Patient Position: Sitting, Cuff Size: Large)   Pulse 94   Temp 98.7 F (37.1 C) (Oral)   Resp 20   Wt 230 lb (104.3 kg)   SpO2 98%   BMI 39.48 kg/m   BP/Weight 07/30/2016 07/29/2016  07/27/2016  Systolic BP 144 122 142  Diastolic BP 91 88 97  Wt. (Lbs) 230 231.38 -  BMI 39.48 39.72 -     Physical Exam  HENT:  Right Ear: Hearing normal. There is swelling. Tympanic membrane is injected and erythematous.  Left Ear: Hearing, tympanic membrane, external ear and ear canal normal.  Eyes: Conjunctivae are normal. Pupils are equal, round, and reactive to light.  Cardiovascular: Normal rate, regular rhythm, normal heart sounds and intact distal pulses.   Pulmonary/Chest: Effort normal and breath sounds normal.  Abdominal: Soft. Bowel sounds are normal.  Skin: Skin is warm and dry.  Nursing note and vitals reviewed.   Assessment & Plan:   Problem List Items Addressed This Visit      Endocrine   DM neuropathy, type II diabetes mellitus (HCC)   Relevant Medications   insulin aspart (NOVOLOG) 100 UNIT/ML FlexPen   Insulin Glargine (LANTUS SOLOSTAR) 100 UNIT/ML Solostar Pen   Other Relevant Orders   Glucose (CBG) (Completed)   POCT urinalysis dipstick (Completed)   Glucose (CBG) (Completed)    Other Visit Diagnoses    Moderate persistent asthma with acute exacerbation    -  Primary   Relevant Medications   fluticasone (FLOVENT HFA) 110 MCG/ACT inhaler   montelukast (SINGULAIR) 10 MG tablet   Cough       Relevant Medications   fluticasone (FLOVENT HFA) 110 MCG/ACT inhaler   montelukast (SINGULAIR) 10 MG tablet   benzonatate (TESSALON) 100 MG capsule   Other Relevant Orders   DG Chest 2 View   Acute suppurative otitis media of right ear without spontaneous rupture of tympanic membrane, recurrence not specified       Relevant Medications   amoxicillin-clavulanate (AUGMENTIN) 875-125 MG tablet   History of fever       Relevant Orders   DG Chest 2 View   Diabetes mellitus without complication (HCC)       Relevant Medications   insulin aspart (NOVOLOG) 100 UNIT/ML FlexPen   Insulin Glargine (LANTUS SOLOSTAR) 100 UNIT/ML Solostar Pen      Meds ordered this  encounter  Medications  . fluticasone (FLOVENT HFA) 110 MCG/ACT inhaler    Sig: Inhale 1 puff into the lungs 2 (two) times daily.    Dispense:  1 Inhaler    Refill:  3    Order Specific Question:   Supervising Provider    Answer:   Quentin Angst [id number]  . amoxicillin-clavulanate (AUGMENTIN) 875-125 MG tablet    Sig: Take 1 tablet by mouth 2 (two) times daily.    Dispense:  20 tablet    Refill:  0    Order Specific Question:   Supervising Provider    Answer:   Quentin Angst [id number]  . montelukast (SINGULAIR) 10 MG tablet    Sig: Take 1 tablet (10 mg total) by mouth  at bedtime.    Dispense:  30 tablet    Refill:  3    Order Specific Question:   Supervising Provider    Answer:   Quentin Angst [id number]  . benzonatate (TESSALON) 100 MG capsule    Sig: Take 1 capsule (100 mg total) by mouth 2 (two) times daily as needed for cough.    Dispense:  20 capsule    Refill:  0    Order Specific Question:   Supervising Provider    Answer:   Quentin Angst [id number]  . insulin aspart (NOVOLOG) 100 UNIT/ML FlexPen    Sig: Inject 10 Units into the skin daily. Take with largest meal.    Dispense:  15 mL    Refill:  11    Order Specific Question:   Supervising Provider    Answer:   Quentin Angst [id number]  . DISCONTD: Insulin Glargine (LANTUS SOLOSTAR) 100 UNIT/ML Solostar Pen    Sig: Inject 48 Units into the skin daily at 10 pm.    Dispense:  6 pen    Refill:  0    Order Specific Question:   Supervising Provider    Answer:   Quentin Angst [id number]  . Insulin Glargine (LANTUS SOLOSTAR) 100 UNIT/ML Solostar Pen    Sig: Inject 48 Units into the skin daily at 10 pm.    Dispense:  6 pen    Refill:  0    Order Specific Question:   Supervising Provider    Answer:   Quentin Angst [[id number]]    Follow-up: Return in about 2 weeks (around 08/13/2016) for Diabetes with Stacy.   Lizbeth Bark FNP

## 2016-07-30 NOTE — Progress Notes (Unsigned)
Subjective:  Patient ID: Sarah Garrison, female    DOB: 12-30-1980  Age: 36 y.o. MRN: [id number]  CC: No chief complaint on file.   HPI Sarah Garrison presents for ***     Outpatient Medications Prior to Visit  Medication Sig Dispense Refill  . ACCU-CHEK SOFTCLIX LANCETS lancets Use three times a day 100 each 12  . acetaminophen (TYLENOL) 500 MG tablet Take 1,000 mg by mouth every 6 (six) hours as needed for mild pain or moderate pain.    Marland Kitchen albuterol (PROVENTIL HFA;VENTOLIN HFA) 108 (90 Base) MCG/ACT inhaler Inhale 2 puffs into the lungs every 6 (six) hours as needed for wheezing or shortness of breath. 1 Inhaler 2  . amLODipine (NORVASC) 2.5 MG tablet Take 1 tablet (2.5 mg total) by mouth daily. Reported on 09/29/2015 (Patient not taking: Reported on 07/29/2016) 90 tablet 3  . amoxicillin-clavulanate (AUGMENTIN) 875-125 MG tablet Take 1 tablet by mouth 2 (two) times daily. 20 tablet 0  . benzonatate (TESSALON) 100 MG capsule Take 1 capsule (100 mg total) by mouth every 8 (eight) hours. (Patient not taking: Reported on 07/30/2016) 15 capsule 0  . benzonatate (TESSALON) 100 MG capsule Take 1 capsule (100 mg total) by mouth 2 (two) times daily as needed for cough. 20 capsule 0  . doxycycline (VIBRAMYCIN) 100 MG capsule Take 1 capsule (100 mg total) by mouth 2 (two) times daily. 20 capsule 0  . fluticasone (FLOVENT HFA) 110 MCG/ACT inhaler Inhale 1 puff into the lungs 2 (two) times daily. 1 Inhaler 3  . glimepiride (AMARYL) 2 MG tablet Take 1 tablet (2 mg total) by mouth daily before breakfast. (Patient not taking: Reported on 07/29/2016) 30 tablet 3  . glucose blood test strip Use as instructed 100 each 12  . ibuprofen (ADVIL,MOTRIN) 600 MG tablet Take 1 tablet (600 mg total) by mouth every 8 (eight) hours as needed. 15 tablet 0  . insulin aspart (NOVOLOG) 100 UNIT/ML FlexPen Inject 10 Units into the skin daily. Take with largest meal. 15 mL 11  . Insulin Glargine (LANTUS SOLOSTAR) 100  UNIT/ML Solostar Pen Inject 48 Units into the skin daily at 10 pm. 6 pen 0  . Insulin Pen Needle 31G X 8 MM MISC 1 Package by Does not apply route daily. 30 each 0  . lisinopril (PRINIVIL,ZESTRIL) 10 MG tablet Take 1 tablet (10 mg total) by mouth daily. (Patient not taking: Reported on 07/30/2016) 90 tablet 3  . montelukast (SINGULAIR) 10 MG tablet Take 1 tablet (10 mg total) by mouth at bedtime. 30 tablet 3  . naproxen (NAPROSYN) 500 MG tablet Take 1 tablet (500 mg total) by mouth 2 (two) times daily. (Patient not taking: Reported on 07/30/2016) 30 tablet 0  . neomycin-colistin-hydrocortisone-thonzonium (CORTISPORIN-TC) 3.08-04-08-0.5 MG/ML otic suspension Place 4 drops into the right ear 4 (four) times daily. (Patient not taking: Reported on 07/29/2016) 10 mL 0  . phenylephrine (NEO-SYNEPHRINE) 1 % nasal spray Place 1 drop into both nostrils every 6 (six) hours as needed for congestion. 30 mL 0  . predniSONE (DELTASONE) 20 MG tablet Take 2 tablets (40 mg total) by mouth daily. (Patient not taking: Reported on 07/29/2016) 10 tablet 0  . sodium chloride (OCEAN) 0.65 % SOLN nasal spray Place 1 spray into both nostrils as needed for congestion. 1 Bottle 0   Facility-Administered Medications Prior to Visit  Medication Dose Route Frequency Provider Last Rate Last Dose  . insulin aspart (novoLOG) injection 10 Units  10 Units Subcutaneous Once  Sarah Mariana Arnavid Gomez, PA-C      . ipratropium-albuterol (DUONEB) 0.5-2.5 (3) MG/3ML nebulizer solution 3 mL  3 mL Nebulization Q6H Sarah Glatterawn T Langeland, MD        ROS Review of Systems  Review of Systems - {ros [id number]}    Objective:  There were no vitals taken for this visit.  BP/Weight 07/30/2016 07/29/2016 07/27/2016  Systolic BP 144 122 142  Diastolic BP 91 88 97  Wt. (Lbs) 230 231.38 -  BMI 39.48 39.72 -     Physical Exam   Assessment & Plan:   Problem List Items Addressed This Visit    None      No orders of the defined types were placed in  this encounter.   Follow-up: No Follow-up on file.   Sarah Garrison

## 2016-07-30 NOTE — Patient Instructions (Signed)
Come back in 2 week for Diabetes follow up with clinical pharmacist   Asthma, Adult Asthma is a condition of the lungs in which the airways tighten and narrow. Asthma can make it hard to breathe. Asthma cannot be cured, but medicine and lifestyle changes can help control it. Asthma may be started (triggered) by:  Animal skin flakes (dander).  Dust.  Cockroaches.  Pollen.  Mold.  Smoke.  Cleaning products.  Hair sprays or aerosol sprays.  Paint fumes or strong smells.  Cold air, weather changes, and winds.  Crying or laughing hard.  Stress.  Certain medicines or drugs.  Foods, such as dried fruit, potato chips, and sparkling grape juice.  Infections or conditions (colds, flu).  Exercise.  Certain medical conditions or diseases.  Exercise or tiring activities. Follow these instructions at home:  Take medicine as told by your doctor.  Use a peak flow meter as told by your doctor. A peak flow meter is a tool that measures how well the lungs are working.  Record and keep track of the peak flow meter's readings.  Understand and use the asthma action plan. An asthma action plan is a written plan for taking care of your asthma and treating your attacks.  To help prevent asthma attacks:  Do not smoke. Stay away from secondhand smoke.  Change your heating and air conditioning filter often.  Limit your use of fireplaces and wood stoves.  Get rid of pests (such as roaches and mice) and their droppings.  Throw away plants if you see mold on them.  Clean your floors. Dust regularly. Use cleaning products that do not smell.  Have someone vacuum when you are not home. Use a vacuum cleaner with a HEPA filter if possible.  Replace carpet with wood, tile, or vinyl flooring. Carpet can trap animal skin flakes and dust.  Use allergy-proof pillows, mattress covers, and box spring covers.  Wash bed sheets and blankets every week in hot water and dry them in a  dryer.  Use blankets that are made of polyester or cotton.  Clean bathrooms and kitchens with bleach. If possible, have someone repaint the walls in these rooms with mold-resistant paint. Keep out of the rooms that are being cleaned and painted.  Wash hands often. Contact a doctor if:  You have make a whistling sound when breaking (wheeze), have shortness of breath, or have a cough even if taking medicine to prevent attacks.  The colored mucus you cough up (sputum) is thicker than usual.  The colored mucus you cough up changes from clear or white to yellow, green, gray, or bloody.  You have problems from the medicine you are taking such as:  A rash.  Itching.  Swelling.  Trouble breathing.  You need reliever medicines more than 2-3 times a week.  Your peak flow measurement is still at 50-79% of your personal best after following the action plan for 1 hour.  You have a fever. Get help right away if:  You seem to be worse and are not responding to medicine during an asthma attack.  You are short of breath even at rest.  You get short of breath when doing very little activity.  You have trouble eating, drinking, or talking.  You have chest pain.  You have a fast heartbeat.  Your lips or fingernails start to turn blue.  You are light-headed, dizzy, or faint.  Your peak flow is less than 50% of your personal best. This information is not  intended to replace advice given to you by your health care provider. Make sure you discuss any questions you have with your health care provider. Document Released: 11/07/2007 Document Revised: 10/27/2015 Document Reviewed: 12/18/2012 Elsevier Interactive Patient Education  2017 [employer].  Type 2 Diabetes Mellitus, Self Care, Adult When you have type 2 diabetes (type 2 diabetes mellitus), you must keep your blood sugar (glucose) under control. You can do this with:  Nutrition.  Exercise.  Lifestyle changes.  Medicines or  insulin, if needed.  Support from your doctors and others. How do I manage my blood sugar?  Check your blood sugar level every day, as often as told.  Call your doctor if your blood sugar is above your goal numbers for 2 tests in a row.  Have your A1c (hemoglobin A1c) level checked at least two times a year. Have it checked more often if your doctor tells you to. Your doctor will set treatment goals for you. Generally, you should have these blood sugar levels:  Before meals (preprandial): 80-130 mg/dL (4.4-7.2 mmol/L).  After meals (postprandial): lower than 180 mg/dL (10 mmol/L).  A1c level: less than 7%. What do I need to know about high blood sugar? High blood sugar is called hyperglycemia. Know the signs of high blood sugar. Signs may include:  Feeling:  Thirsty.  Hungry.  Very tired.  Needing to pee (urinate) more than usual.  Blurry vision. What do I need to know about low blood sugar? Low blood sugar is called hypoglycemia. This is when blood sugar is at or below 70 mg/dL (3.9 mmol/L). Symptoms may include:  Feeling:  Hungry.  Worried or nervous (anxious).  Sweaty and clammy.  Confused.  Dizzy.  Sleepy.  Sick to your stomach (nauseous).  Having:  A fast heartbeat (palpitations).  A headache.  A change in your vision.  Jerky movements that you cannot control (seizure).  Nightmares.  Tingling or no feeling (numbness) around the mouth, lips, or tongue.  Having trouble with:  Talking.  Paying attention (concentrating).  Moving (coordination).  Sleeping.  Shaking.  Passing out (fainting).  Getting upset easily (irritability). Treating low blood sugar  To treat low blood sugar, eat or drink something sugary right away. If you can think clearly and swallow safely, follow the 15:15 rule:  Take 15 grams of a fast-acting carb (carbohydrate). Some fast-acting carbs are:  1 tube of glucose gel.  3 sugar tablets (glucose pills).  6-8  pieces of hard candy.  4 oz (120 mL) of fruit juice.  4 oz (120 mL) regular (not diet) soda.  Check your blood sugar 15 minutes after you take the carb.  If your blood sugar is still at or below 70 mg/dL (3.9 mmol/L), take 15 grams of a carb again.  If your blood sugar does not go above 70 mg/dL (3.9 mmol/L) after 3 tries, get help right away.  After your blood sugar goes back to normal, eat a meal or a snack within 1 hour. Treating very low blood sugar  If your blood sugar is at or below 54 mg/dL (3 mmol/L), you have very low blood sugar (severe hypoglycemia). This is an emergency. Do not wait to see if the symptoms will go away. Get medical help right away. Call your local emergency services (911 in the [place] Do not drive yourself to the hospital. If you have very low blood sugar and you cannot eat or drink, you may need a glucagon shot (injection). A family member or  friend should learn how to check your blood sugar and how to give you a glucagon shot. Ask your doctor if you need to have a glucagon shot kit at home. What else is important to manage my diabetes? Medicine  Follow these instructions about insulin and diabetes medicines:  Take them as told by your doctor.  Adjust them as told by your doctor.  Do not run out of them. Having diabetes can raise your risk for other long-term conditions. These include heart or kidney disease. Your doctor may prescribe medicines to help prevent problems from diabetes. Food   Make healthy food choices. These include:  Chicken, fish, egg whites, and beans.  Oats, whole wheat, bulgur, brown rice, quinoa, and millet.  Fresh fruits and vegetables.  Low-fat dairy products.  Nuts, avocado, olive oil, and canola oil.  Make a food plan with a specialist (dietitian).  Follow instructions from your doctor about what you cannot eat or drink.  Drink enough fluid to keep your pee (urine) clear or pale yellow.  Eat healthy snacks between  healthy meals.  Keep track of carbs that you eat. Read food labels. Learn food serving sizes.  Follow your sick day plan when you cannot eat or drink normally. Make this plan with your doctor so it is ready to use. Activity  Exercise at least 3 times a week.  Do not go more than 2 days without exercising.  Talk with your doctor before you start a new exercise. Your doctor may need to adjust your insulin, medicines, or food. Lifestyle   Do not use any tobacco products. These include cigarettes, chewing tobacco, and e-cigarettes.If you need help quitting, ask your doctor.  Ask your doctor how much alcohol is safe for you.  Learn to deal with stress. If you need help with this, ask your doctor. Body care  Stay up to date with your shots (immunizations).  Have your eyes and feet checked by a doctor as often as told.  Check your skin and feet every day. Check for cuts, bruises, redness, blisters, or sores.  Brush your teeth and gums two times a day.  Floss at least one time a day.  Go to the dentist least one time every 6 months.  Stay at a healthy weight. General instructions   Take over-the-counter and prescription medicines only as told by your doctor.  Share your diabetes care plan with:  Your work or school.  People you live with.  Check your pee (urine) for ketones:  When you are sick.  As told by your doctor.  Carry a card or wear jewelry that says that you have diabetes.  Ask your doctor:  Do I need to meet with a diabetes educator?  Where can I find a support group for people with diabetes?  Keep all follow-up visits as told by your doctor. This is important. Where to find more information: To learn more about diabetes, visit:  American Diabetes Association: www.diabetes.org  American Association of Diabetes Educators: www.diabeteseducator.org/patient-resources This information is not intended to replace advice given to you by your health care  provider. Make sure you discuss any questions you have with your health care provider. Document Released: 09/12/2015 Document Revised: 10/27/2015 Document Reviewed: 06/24/2015 Elsevier Interactive Patient Education  2017 [employer].

## 2016-07-31 MED FILL — LANTUS SOLOSTAR 100 UNITS/M: 100 | 31 days supply | Qty: 15 | Fill #0

## 2016-07-31 MED FILL — NOVOLOG FLEXPEN SYRINGE: 100 | 30 days supply | Qty: 3 | Fill #0

## 2016-07-31 MED FILL — MONTELUKAST SOD 10 MG TAB: 10 | 30 days supply | Qty: 30 | Fill #0

## 2016-07-31 MED FILL — AMOX-CLAV 875-125 MG TABLET: 875-125 | 10 days supply | Qty: 20 | Fill #0

## 2016-07-31 MED FILL — BENZONATATE 100 MG CAPSULE: 100 | 10 days supply | Qty: 20 | Fill #0

## 2016-07-31 MED FILL — FLOVENT HFA 110 MCG INHALER: 110 | 30 days supply | Qty: 12 | Fill #0

## 2016-08-01 ENCOUNTER — Other Ambulatory Visit: Payer: Self-pay | Admitting: Internal Medicine

## 2016-08-01 MED ORDER — GLIPIZIDE 5 MG PO TABS: 5.0000 mg | ORAL_TABLET | Freq: Every day | ORAL | 3 refills | Status: DC

## 2016-08-06 MED FILL — glipiZIDE 5 MG TABS: 5 | 30 days supply | Qty: 30 | Fill #0

## 2016-08-09 ENCOUNTER — Emergency Department (HOSPITAL_COMMUNITY)
Admission: EM | Admit: 2016-08-09 | Discharge: 2016-08-09 | Disposition: A | Discharge status: Home / Self Care | Payer: Medicaid Other | Attending: Emergency Medicine | Admitting: Emergency Medicine

## 2016-08-09 ENCOUNTER — Encounter (HOSPITAL_COMMUNITY): Payer: Self-pay

## 2016-08-09 DIAGNOSIS — M79662 Pain in left lower leg: Secondary | ICD-10-CM | POA: Insufficient documentation

## 2016-08-09 DIAGNOSIS — Z794 Long term (current) use of insulin: Secondary | ICD-10-CM | POA: Insufficient documentation

## 2016-08-09 DIAGNOSIS — Z87891 Personal history of nicotine dependence: Secondary | ICD-10-CM | POA: Insufficient documentation

## 2016-08-09 DIAGNOSIS — Z7984 Long term (current) use of oral hypoglycemic drugs: Secondary | ICD-10-CM | POA: Insufficient documentation

## 2016-08-09 DIAGNOSIS — J45909 Unspecified asthma, uncomplicated: Secondary | ICD-10-CM | POA: Insufficient documentation

## 2016-08-09 DIAGNOSIS — M79661 Pain in right lower leg: Secondary | ICD-10-CM | POA: Insufficient documentation

## 2016-08-09 DIAGNOSIS — M79605 Pain in left leg: Secondary | ICD-10-CM

## 2016-08-09 DIAGNOSIS — I10 Essential (primary) hypertension: Secondary | ICD-10-CM | POA: Insufficient documentation

## 2016-08-09 DIAGNOSIS — E1140 Type 2 diabetes mellitus with diabetic neuropathy, unspecified: Secondary | ICD-10-CM | POA: Insufficient documentation

## 2016-08-09 DIAGNOSIS — M79604 Pain in right leg: Secondary | ICD-10-CM

## 2016-08-09 LAB — CBC WITH DIFFERENTIAL/PLATELET
Basophils Absolute: 0 10*3/uL (ref 0.0–0.1)
Basophils Relative: 1 %
Eosinophils Absolute: 0.1 10*3/uL (ref 0.0–0.7)
Eosinophils Relative: 1 %
HCT: 40.7 % (ref 36.0–46.0)
HEMOGLOBIN: 14 g/dL (ref 12.0–15.0)
LYMPHS PCT: 46 %
Lymphs Abs: 3 10*3/uL (ref 0.7–4.0)
MCH: 29.1 pg (ref 26.0–34.0)
MCHC: 34.4 g/dL (ref 30.0–36.0)
MCV: 84.6 fL (ref 78.0–100.0)
Monocytes Absolute: 0.3 10*3/uL (ref 0.1–1.0)
Monocytes Relative: 5 %
NEUTROS PCT: 47 %
Neutro Abs: 3.1 10*3/uL (ref 1.7–7.7)
Platelets: 304 10*3/uL (ref 150–400)
RBC: 4.81 MIL/uL (ref 3.87–5.11)
RDW: 12.7 % (ref 11.5–15.5)
WBC: 6.6 10*3/uL (ref 4.0–10.5)

## 2016-08-09 LAB — D-DIMER, QUANTITATIVE (NOT AT ARMC): D DIMER QUANT: 0.4 ug{FEU}/mL (ref 0.00–0.50)

## 2016-08-09 LAB — CK: Total CK: 180 U/L (ref 38–234)

## 2016-08-09 LAB — BASIC METABOLIC PANEL
Anion gap: 5 (ref 5–15)
BUN: 12 mg/dL (ref 6–20)
CHLORIDE: 106 mmol/L (ref 101–111)
CO2: 26 mmol/L (ref 22–32)
Calcium: 9.3 mg/dL (ref 8.9–10.3)
Creatinine, Ser: 0.64 mg/dL (ref 0.44–1.00)
GFR calc Af Amer: >60 mL/min (ref >60)
GFR calc non Af Amer: >60 mL/min (ref >60)
GLUCOSE: 268 mg/dL — AB (ref 65–99)
POTASSIUM: 3.8 mmol/L (ref 3.5–5.1)
Sodium: 137 mmol/L (ref 135–145)

## 2016-08-09 LAB — CBG MONITORING, ED: Glucose-Capillary: 271 mg/dL — ABNORMAL HIGH (ref 65–99)

## 2016-08-09 MED ORDER — OXYCODONE-ACETAMINOPHEN 5-325 MG PO TABS
1.0000 | ORAL_TABLET | Freq: Once | ORAL | Status: DC
  Filled 2016-08-09: qty 1

## 2016-08-09 MED ORDER — TRAMADOL HCL 50 MG PO TABS
50.0000 mg | ORAL_TABLET | Freq: Once | ORAL | Status: AC
  Administered 2016-08-09: 50 mg via ORAL
  Filled 2016-08-09: qty 1

## 2016-08-09 MED ORDER — TRAMADOL HCL 50 MG PO TABS: 50.0000 mg | ORAL_TABLET | Freq: Four times a day (QID) | ORAL | 0 refills | Status: DC | PRN

## 2016-08-09 NOTE — ED Notes (Signed)
Patient was alert, oriented and stable upon discharge. RN went over AVS and patient had no further questions.  

## 2016-08-09 NOTE — Discharge Instructions (Signed)
Continue to take your insulin. Take tramadol as prescribed as needed for pain if not improving with tylenol. Keep legs elevated. Try heating pads. Follow up with family doctor.

## 2016-08-09 NOTE — ED Triage Notes (Signed)
PT C/O BILATERAL CALF PAINAND SWELLING AFTER STARTING NOVOLOG INSULIN SINCE Friday.PT STS THE PAIN GETS SO BAD THAT IT MAKES HER VOMIT. PT STS AFTER THE INJECTION, SHE FEELS OUT OF BREATH.

## 2016-08-09 NOTE — ED Notes (Signed)
Pt called for triage, registration stated that pt was stepping outside and would be back.

## 2016-08-09 NOTE — ED Provider Notes (Signed)
[facility]-EMERGENCY DEPT Provider Note   CSN: [id number] Arrival date & time: 08/09/16  4098     History   Chief Complaint Chief Complaint  Patient presents with  . Leg Pain    BILATERAL    HPI Sarah Garrison is a 36 y.o. female.  HPI Sarah Garrison is a 36 y.o. female with history of hypertension, diabetes, obesity, leg swelling, asthma, presents to emergency department complaining of bilateral calf pain. Patient states that she was started on NovoLog 5 days ago. Since receiving his injections that she has developed increased pain in bilateral lower legs. She states pain is constant. It is worse with palpation of the legs. It is worse with walking and movement of the ankles. She reports that to her her calves are swollen. She denies any numbness distally. She reports tingling sensation in bilateral calf and sensitivity to even light touch. She states sometimes she gets short of breath specifically after getting her injections. She denies any injuries or unusual physical activity. She denies change in her diet. She denies any changes in her other medications. She has been taking ibuprofen for pain with no improvement. No hx of DVTs. No fever or chills.   Past Medical History:  Diagnosis Date  . Asthma   . Diabetes mellitus without complication (HCC)   . GERD (gastroesophageal reflux disease)   . Hypertension     Patient Active Problem List   Diagnosis Date Noted  . Asthma 07/30/2016  . Uses depot medroxyprogesterone acetate as primary birth control method 05/07/2016  . Encounter for pregnancy test 02/01/2016  . Leg swelling 09/29/2015  . Morbid obesity (HCC) 09/29/2015  . DM neuropathy, type II diabetes mellitus (HCC) 08/24/2015    Past Surgical History:  Procedure Laterality Date  . CESAREAN SECTION    . TUBAL LIGATION    . WISDOM TOOTH EXTRACTION      OB History    Gravida Para Term Preterm AB Living   5 4     1 4    SAB TAB Ectopic Multiple Live Births   1                Home Medications    Prior to Admission medications   Medication Sig Start Date End Date Taking? Authorizing Provider  ACCU-CHEK SOFTCLIX LANCETS lancets Use three times a day 07/29/16   Trixie Dredge, PA-C  acetaminophen (TYLENOL) 500 MG tablet Take 1,000 mg by mouth every 6 (six) hours as needed for mild pain or moderate pain.    Historical Provider, MD  albuterol (PROVENTIL HFA;VENTOLIN HFA) 108 (90 Base) MCG/ACT inhaler Inhale 2 puffs into the lungs every 6 (six) hours as needed for wheezing or shortness of breath. 07/20/16   Roger Mariana Arn, PA-C  amLODipine (NORVASC) 2.5 MG tablet Take 1 tablet (2.5 mg total) by mouth daily. Reported on 09/29/2015 Patient not taking: Reported on 07/29/2016 02/01/16   Pete Glatter, MD  amoxicillin-clavulanate (AUGMENTIN) 875-125 MG tablet Take 1 tablet by mouth 2 (two) times daily. 07/30/16   Lizbeth Bark, FNP  benzonatate (TESSALON) 100 MG capsule Take 1 capsule (100 mg total) by mouth every 8 (eight) hours. Patient not taking: Reported on 07/30/2016 07/16/16   Renne Crigler, PA-C  benzonatate (TESSALON) 100 MG capsule Take 1 capsule (100 mg total) by mouth 2 (two) times daily as needed for cough. 07/30/16   Lizbeth Bark, FNP  doxycycline (VIBRAMYCIN) 100 MG capsule Take 1 capsule (100 mg total) by mouth 2 (two)  times daily. 07/27/16   Deatra CanterWilliam J Oxford, FNP  fluticasone (FLOVENT HFA) 110 MCG/ACT inhaler Inhale 1 puff into the lungs 2 (two) times daily. 07/30/16   Lizbeth BarkMandesia R Hairston, FNP  glipiZIDE (GLUCOTROL) 5 MG tablet Take 1 tablet (5 mg total) by mouth daily before breakfast. 08/01/16   Pete Glatterawn T Langeland, MD  glucose blood test strip Use as instructed 07/20/16   Loletta Specteroger David Gomez, PA-C  ibuprofen (ADVIL,MOTRIN) 600 MG tablet Take 1 tablet (600 mg total) by mouth every 8 (eight) hours as needed. 07/29/16   Trixie DredgeEmily West, PA-C  insulin aspart (NOVOLOG) 100 UNIT/ML FlexPen Inject 10 Units into the skin daily. Take with largest meal. 07/30/16    Lizbeth BarkMandesia R Hairston, FNP  Insulin Glargine (LANTUS SOLOSTAR) 100 UNIT/ML Solostar Pen Inject 48 Units into the skin daily at 10 pm. 07/30/16   Lizbeth BarkMandesia R Hairston, FNP  Insulin Pen Needle 31G X 8 MM MISC 1 Package by Does not apply route daily. 07/29/16   Trixie DredgeEmily West, PA-C  lisinopril (PRINIVIL,ZESTRIL) 10 MG tablet Take 1 tablet (10 mg total) by mouth daily. Patient not taking: Reported on 07/30/2016 07/27/16   Loletta Specteroger David Gomez, PA-C  montelukast (SINGULAIR) 10 MG tablet Take 1 tablet (10 mg total) by mouth at bedtime. 07/30/16   Lizbeth BarkMandesia R Hairston, FNP  naproxen (NAPROSYN) 500 MG tablet Take 1 tablet (500 mg total) by mouth 2 (two) times daily. Patient not taking: Reported on 07/30/2016 02/09/16   April Palumbo, MD  neomycin-colistin-hydrocortisone-thonzonium (CORTISPORIN-TC) 3.08-04-08-0.5 MG/ML otic suspension Place 4 drops into the right ear 4 (four) times daily. Patient not taking: Reported on 07/29/2016 07/20/16   Loletta Specteroger David Gomez, PA-C  phenylephrine (NEO-SYNEPHRINE) 1 % nasal spray Place 1 drop into both nostrils every 6 (six) hours as needed for congestion. 07/20/16   Roger Mariana Arnavid Gomez, PA-C  predniSONE (DELTASONE) 20 MG tablet Take 2 tablets (40 mg total) by mouth daily. Patient not taking: Reported on 07/29/2016 07/16/16   Renne CriglerJoshua Geiple, PA-C  sodium chloride (OCEAN) 0.65 % SOLN nasal spray Place 1 spray into both nostrils as needed for congestion. 07/20/16   Loletta Specteroger David Gomez, PA-C    Family History Family History  Problem Relation Age of Onset  . Asthma Mother   . Asthma Father     Social History Social History  Substance Use Topics  . Smoking status: Former Smoker    Packs/day: 0.00    Types: Cigarettes    Quit date: 11/02/2013  . Smokeless tobacco: Never Used  . Alcohol use No     Allergies   Morphine and related; Metformin and related; and Glyburide   Review of Systems Review of Systems  Constitutional: Negative for chills and fever.  Respiratory: Negative for cough, chest  tightness and shortness of breath.   Cardiovascular: Negative for chest pain, palpitations and leg swelling.  Gastrointestinal: Negative for abdominal pain, diarrhea, nausea and vomiting.  Musculoskeletal: Positive for arthralgias and myalgias. Negative for neck pain and neck stiffness.  Skin: Negative for rash.  Neurological: Negative for dizziness, weakness and headaches.  All other systems reviewed and are negative.    Physical Exam Updated Vital Signs BP 112/79 (BP Location: Left Arm)   Pulse 106   Temp 98.7 F (37.1 C) (Oral)   Resp 18   Ht 5\' 4"  (1.626 m)   Wt 102.5 kg   SpO2 98%   BMI 38.79 kg/m   Physical Exam  Constitutional: She appears well-developed and well-nourished. No distress.  Eyes: Conjunctivae are normal.  Neck: Neck supple.  Musculoskeletal:  No obvious swelling, skin discoloration, bruising noted to the bilateral lower extremities. There is significant tenderness over bilateral calves given to the soft touch. There is sensitivity with touching of the skin. Pain with bilateral ankle dorsiflexion. DP pulses intact.   Neurological: She is alert.  Skin: Skin is warm and dry.  Nursing note and vitals reviewed.    ED Treatments / Results  Labs (all labs ordered are listed, but only abnormal results are displayed) Labs Reviewed  BASIC METABOLIC PANEL - Abnormal; Notable for the following:       Result Value   Glucose, Bld 268 (*)    All other components within normal limits  CBG MONITORING, ED - Abnormal; Notable for the following:    Glucose-Capillary 271 (*)    All other components within normal limits  CBC WITH DIFFERENTIAL/PLATELET  D-DIMER, QUANTITATIVE (NOT AT [facility])  CK    EKG  EKG Interpretation None       Radiology No results found.  Procedures Procedures (including critical care time)  Medications Ordered in ED Medications - No data to display   Initial Impression / Assessment and Plan / ED Course  I have reviewed the triage  vital signs and the nursing notes.  Pertinent labs & imaging results that were available during my care of the patient were reviewed by me and considered in my medical decision making (see chart for details).    Patient with bilateral lower leg pain for possibly 5 days. She reports swelling to bilateral calves and pain management movement of the ankles, walking, palpation of the calves. Will check labs including electrolytes, ck level, d dimer. Percocet ordered for pain.  All labs unremarkable. Question hypersensitivity reaction from insulin vs muscle soreness.plan to dc home with tramadol and pcp follow up.   Vitals:   08/09/16 1931 08/09/16 2003  BP: 112/79   Pulse: 106   Resp: 18   Temp: 98.7 F (37.1 C)   TempSrc: Oral   SpO2: 98%   Weight:  102.5 kg  Height:  5\' 4"  (1.626 m)     Final Clinical Impressions(s) / ED Diagnoses   Final diagnoses:  Bilateral leg pain    New Prescriptions New Prescriptions   No medications on file     Jaynie Crumble, PA-C 08/10/16 1610    Alvira Monday, MD 08/17/16 [phone]

## 2016-08-12 ENCOUNTER — Ambulatory Visit (HOSPITAL_COMMUNITY)
Admission: EM | Admit: 2016-08-12 | Discharge: 2016-08-12 | Disposition: A | Discharge status: Home / Self Care | Payer: Medicaid Other | Attending: Nurse Practitioner | Admitting: Nurse Practitioner

## 2016-08-12 ENCOUNTER — Encounter (HOSPITAL_COMMUNITY): Payer: Self-pay | Admitting: Emergency Medicine

## 2016-08-12 DIAGNOSIS — E1140 Type 2 diabetes mellitus with diabetic neuropathy, unspecified: Secondary | ICD-10-CM | POA: Insufficient documentation

## 2016-08-12 DIAGNOSIS — Z79899 Other long term (current) drug therapy: Secondary | ICD-10-CM | POA: Diagnosis not present

## 2016-08-12 DIAGNOSIS — I10 Essential (primary) hypertension: Secondary | ICD-10-CM | POA: Diagnosis not present

## 2016-08-12 DIAGNOSIS — Z794 Long term (current) use of insulin: Secondary | ICD-10-CM | POA: Diagnosis not present

## 2016-08-12 DIAGNOSIS — M79661 Pain in right lower leg: Secondary | ICD-10-CM | POA: Diagnosis not present

## 2016-08-12 DIAGNOSIS — Z87891 Personal history of nicotine dependence: Secondary | ICD-10-CM | POA: Insufficient documentation

## 2016-08-12 DIAGNOSIS — M79604 Pain in right leg: Secondary | ICD-10-CM | POA: Diagnosis not present

## 2016-08-12 DIAGNOSIS — J029 Acute pharyngitis, unspecified: Secondary | ICD-10-CM

## 2016-08-12 DIAGNOSIS — K219 Gastro-esophageal reflux disease without esophagitis: Secondary | ICD-10-CM | POA: Insufficient documentation

## 2016-08-12 DIAGNOSIS — B9789 Other viral agents as the cause of diseases classified elsewhere: Secondary | ICD-10-CM | POA: Insufficient documentation

## 2016-08-12 DIAGNOSIS — M79662 Pain in left lower leg: Secondary | ICD-10-CM | POA: Insufficient documentation

## 2016-08-12 DIAGNOSIS — M79605 Pain in left leg: Secondary | ICD-10-CM

## 2016-08-12 DIAGNOSIS — J45909 Unspecified asthma, uncomplicated: Secondary | ICD-10-CM | POA: Insufficient documentation

## 2016-08-12 DIAGNOSIS — J028 Acute pharyngitis due to other specified organisms: Secondary | ICD-10-CM | POA: Insufficient documentation

## 2016-08-12 LAB — POCT RAPID STREP A: Streptococcus, Group A Screen (Direct): NEGATIVE

## 2016-08-12 NOTE — ED Provider Notes (Signed)
CSN: [id number]     Arrival date & time 08/12/16  1201 History   None    Chief Complaint  Patient presents with  . Leg Pain  . Sore Throat   (Consider location/radiation/quality/duration/timing/severity/associated sxs/prior Treatment) Patient is a well-appearing 36 y.o. Female, with history of T2DM, Asthma, HTN, and GERD, presents today for bilateral calf pain since 03/02 after staring Novolog. Patient denies leg pain prior to starting the novolog.  She was seem 3 days ago in the ER for the same complaint with negative labs including D-dimer. Patient was send home to f/u with PCP. Patient has not yet since f/u with PCP and is returning today to urgent care for re-evaluation. Her last hgba1cis 14 on 03/08.   Pain is constant and " feels like needling pricking" with occasional shooting sensation. She endorses a history of diabetic neuropathy and currently on gabapentin.   She is also here for sore throat onset this morning with 0430 a.m. Patient denies sick contact at home. She also endorses coughing, runny nose and headache. She denies nasal congestion fever, abdominal pain, nausea, vomiting, diarrhea.  Throat is worse with swallowing.   The history is provided by the patient.    Past Medical History:  Diagnosis Date  . Asthma   . Diabetes mellitus without complication (HCC)   . GERD (gastroesophageal reflux disease)   . Hypertension    Past Surgical History:  Procedure Laterality Date  . CESAREAN SECTION    . TUBAL LIGATION    . WISDOM TOOTH EXTRACTION     Family History  Problem Relation Age of Onset  . Asthma Mother   . Asthma Father    Social History  Substance Use Topics  . Smoking status: Former Smoker    Packs/day: 0.00    Types: Cigarettes    Quit date: 11/02/2013  . Smokeless tobacco: Never Used  . Alcohol use No   OB History    Gravida Para Term Preterm AB Living   5 4     1 4    SAB TAB Ectopic Multiple Live Births   1             Review of Systems   Constitutional:       As stated in history of present illness    Allergies  Morphine and related; Metformin and related; Glyburide; Oxycodone; and Vicodin [hydrocodone-acetaminophen]  Home Medications   Prior to Admission medications   Medication Sig Start Date End Date Taking? Authorizing Provider  benzonatate (TESSALON) 100 MG capsule Take 1 capsule (100 mg total) by mouth 2 (two) times daily as needed for cough. 07/30/16  Yes Lizbeth BarkMandesia R Hairston, FNP  doxycycline (VIBRAMYCIN) 100 MG capsule Take 1 capsule (100 mg total) by mouth 2 (two) times daily. 07/27/16  Yes Deatra CanterWilliam J Oxford, FNP  insulin aspart (NOVOLOG) 100 UNIT/ML FlexPen Inject 10 Units into the skin daily. Take with largest meal. Patient taking differently: Inject 10 Units into the skin daily with supper.  07/30/16  Yes Lizbeth BarkMandesia R Hairston, FNP  Insulin Glargine (LANTUS SOLOSTAR) 100 UNIT/ML Solostar Pen Inject 48 Units into the skin daily at 10 pm. 07/30/16  Yes Lizbeth BarkMandesia R Hairston, FNP   Meds Ordered and Administered this Visit  Medications - No data to display  BP 114/84 (BP Location: Right Arm)   Pulse 107   Temp 98.1 F (36.7 C) (Oral)   Resp 18   SpO2 99%  No data found.   Physical Exam  Constitutional: She is oriented  to person, place, and time. She appears well-developed and well-nourished.  HENT:  Head: Normocephalic and atraumatic.  Right Ear: External ear normal.  Left Ear: External ear normal.  Nose: Nose normal.  Mouth/Throat: Oropharynx is clear and moist. No oropharyngeal exudate.  TM pearly gray bilaterally  Eyes: Conjunctivae are normal. Pupils are equal, round, and reactive to light.  Neck: Normal range of motion. Neck supple.  Cardiovascular: Normal rate, regular rhythm and normal heart sounds.   No murmur heard. Pulmonary/Chest: Effort normal and breath sounds normal. No respiratory distress. She has no wheezes.  Abdominal: Soft. Bowel sounds are normal. She exhibits no distension. There is no  tenderness.  Musculoskeletal:  Is full range of motion. Bilateral lower calf area are very tender to palpate. No swelling noted.  Neurological: She is alert and oriented to person, place, and time.  Skin: Skin is warm and dry.  No Discoloration noted  Nursing note and vitals reviewed.   Urgent Care Course     Procedures (including critical care time)  Labs Review Labs Reviewed  POCT RAPID STREP A    Imaging Review No results found.  MDM   1. Pain in both lower extremities   2. Viral pharyngitis    1) No clear etiology for the calf pain. Doubt hypersensitivity to the Novolog. Diabetic neuropathy high in differential. Patient advised to take NSAID for pain relief. Advised to schedule an appointment with PCP early next week for follow up.   2) Rapid strep is negative. Patient educated to use salt water gargle, honey, throat lozenges, or chloraseptic spray for relief.    Lucia Estelle, NP 08/12/16 1313

## 2016-08-12 NOTE — ED Triage Notes (Signed)
[facility] a [facility] bilateral lower leg pain x 1 week. [facility].  The patient also complained of a sore throat that started this am.

## 2016-08-14 LAB — CULTURE, GROUP A STREP (THRC)

## 2016-08-16 ENCOUNTER — Ambulatory Visit: Payer: Medicaid Other | Attending: Internal Medicine | Admitting: Physician Assistant

## 2016-08-16 VITALS — BP 115/81 | HR 115 | Temp 98.4°F | Resp 18 | Ht 64.0 in | Wt 227.2 lb

## 2016-08-16 DIAGNOSIS — Z794 Long term (current) use of insulin: Secondary | ICD-10-CM | POA: Insufficient documentation

## 2016-08-16 DIAGNOSIS — M79675 Pain in left toe(s): Secondary | ICD-10-CM

## 2016-08-16 DIAGNOSIS — E1140 Type 2 diabetes mellitus with diabetic neuropathy, unspecified: Secondary | ICD-10-CM

## 2016-08-16 DIAGNOSIS — M79604 Pain in right leg: Secondary | ICD-10-CM | POA: Diagnosis not present

## 2016-08-16 DIAGNOSIS — I10 Essential (primary) hypertension: Secondary | ICD-10-CM | POA: Diagnosis not present

## 2016-08-16 DIAGNOSIS — E119 Type 2 diabetes mellitus without complications: Secondary | ICD-10-CM

## 2016-08-16 DIAGNOSIS — Z79899 Other long term (current) drug therapy: Secondary | ICD-10-CM | POA: Insufficient documentation

## 2016-08-16 DIAGNOSIS — K219 Gastro-esophageal reflux disease without esophagitis: Secondary | ICD-10-CM

## 2016-08-16 DIAGNOSIS — M79605 Pain in left leg: Secondary | ICD-10-CM | POA: Insufficient documentation

## 2016-08-16 LAB — GLUCOSE, POCT (MANUAL RESULT ENTRY): POC Glucose: 332 mg/dl — AB (ref 70–99)

## 2016-08-16 LAB — POCT GLYCOSYLATED HEMOGLOBIN (HGB A1C): Hemoglobin A1C: 12.6

## 2016-08-16 MED ORDER — GABAPENTIN 300 MG PO CAPS: 300.0000 mg | ORAL_CAPSULE | Freq: Three times a day (TID) | ORAL | 3 refills | Status: DC

## 2016-08-16 MED ORDER — INSULIN GLARGINE 100 UNIT/ML SOLOSTAR PEN: 48.0000 [IU] | PEN_INJECTOR | Freq: Every day | SUBCUTANEOUS | 0 refills | Status: DC

## 2016-08-16 MED ORDER — INSULIN ASPART 100 UNIT/ML FLEXPEN: 10.0000 [IU] | PEN_INJECTOR | Freq: Every day | SUBCUTANEOUS | 11 refills | Status: DC

## 2016-08-16 MED ORDER — OMEPRAZOLE 40 MG PO CPDR: 40.0000 mg | DELAYED_RELEASE_CAPSULE | Freq: Every day | ORAL | 3 refills | Status: DC

## 2016-08-16 NOTE — Progress Notes (Signed)
Patient is here for Shoulder and leg pain  Patient has taken medication today. Patient has eaten today.

## 2016-08-16 NOTE — Progress Notes (Signed)
Sarah Doomlicia Ou, is a 36 y.o. female  ZOX:[id number]  [id number]  DOB - February 17, 1981  Subjective:  Chief Complaint and HPI: Sarah Garrison is a 36 y.o. female here today for a follow up visit after being seen in the ED for leg pain on 3/08 and 08/12/2016.  D-dimer and CK negative. She also had a ST and negative strep test which has since resolved.  Her leg pain has also improved as she stopped having fever with URI 2 days ago.  She does continue to have B lower leg pain and paresthesias.  No calf swelling.  L leg hurts more than the R.  No SOB or CP.  She also c/o L great toe pain for over a month.  Too tender to even touch.  It did not improve when she was recently on Doxycycline for another problem.  She also c/o acid reflux and wants omeprazole RF which she has taken in the past.    Her fevers resolved 2 days ago.  She also c/o R upper arm pain.  NKI.   ED/Hospital notes reviewed.    ROS:   Constitutional:  No f/c, No night sweats, No unexplained weight loss. EENT:  No vision changes, No blurry vision, No hearing changes. No mouth, throat, or ear problems.  Respiratory: No cough, No SOB Cardiac: No CP, no palpitations GI:  No abd pain, No N/V/D. GU: No Urinary s/sx Musculoskeletal: No joint pain Neuro: No headache, no dizziness, no motor weakness.  Skin: No rash Endocrine:  No polydipsia. No polyuria.  Psych: Denies SI/HI  No problems updated.  ALLERGIES: Allergies  Allergen Reactions  . Morphine And Related Shortness Of Breath  . Metformin And Related Diarrhea  . Glyburide Diarrhea and Other (See Comments)    Reaction:  Nose bleeds   . Oxycodone Other (See Comments)    Pt states that this medication makes her dream about rabbits chasing her.    . Vicodin [Hydrocodone-Acetaminophen] Other (See Comments)    Pt states that this medication makes her dream about rabbits chasing her.      PAST MEDICAL HISTORY: Past Medical History:  Diagnosis Date  . Asthma   . Diabetes  mellitus without complication (HCC)   . GERD (gastroesophageal reflux disease)   . Hypertension     MEDICATIONS AT HOME: Prior to Admission medications   Medication Sig Start Date End Date Taking? Authorizing Provider  benzonatate (TESSALON) 100 MG capsule Take 1 capsule (100 mg total) by mouth 2 (two) times daily as needed for cough. 07/30/16   Lizbeth BarkMandesia R Hairston, FNP  doxycycline (VIBRAMYCIN) 100 MG capsule Take 1 capsule (100 mg total) by mouth 2 (two) times daily. 07/27/16   Deatra CanterWilliam J Oxford, FNP  gabapentin (NEURONTIN) 300 MG capsule Take 1 capsule (300 mg total) by mouth 3 (three) times daily. 08/16/16   Anders SimmondsAngela M Talea Manges, PA-C  glipiZIDE (GLUCOTROL) 5 MG tablet Take 5 mg by mouth 1 day or 1 dose. 08/06/16   Historical Provider, MD  insulin aspart (NOVOLOG) 100 UNIT/ML FlexPen Inject 10 Units into the skin daily. Take with largest meal. Patient taking differently: Inject 10 Units into the skin daily with supper.  07/30/16   Lizbeth BarkMandesia R Hairston, FNP  Insulin Glargine (LANTUS SOLOSTAR) 100 UNIT/ML Solostar Pen Inject 48 Units into the skin daily at 10 pm. 07/30/16   Lizbeth BarkMandesia R Hairston, FNP  omeprazole (PRILOSEC) 40 MG capsule Take 1 capsule (40 mg total) by mouth daily. 08/16/16   Anders SimmondsAngela M Taeya Theall, PA-C  Objective:  EXAM:   Vitals:   08/16/16 1010  BP: 115/81  Pulse: (!) 115  Resp: 18  Temp: 98.4 F (36.9 C)  TempSrc: Oral  SpO2: 98%  Weight: 227 lb 3.2 oz (103.1 kg)  Height: 5\' 4"  (1.626 m)    General appearance : A&OX3. NAD. Non-toxic-appearing HEENT: Atraumatic and Normocephalic.  PERRLA. EOM intact.  TM clear B. Mouth-MMM, post pharynx WNL w/o erythema, No PND. Neck: supple, no JVD. No cervical lymphadenopathy. No thyromegaly Chest/Lungs:  Breathing-non-labored, Good air entry bilaterally, breath sounds normal without rales, rhonchi, or wheezing  CVS: S1 S2 regular, no murmurs, gallops, rubs  Abdomen: Bowel sounds present, Non tender and not distended with no gaurding,  rigidity or rebound. Extremities: Bilateral Lower Ext shows no edema, both legs are warm to touch with = pulse throughout Neurology:  CN II-XII grossly intact, Non focal.   Psych:  TP linear. J/I WNL. Normal speech. Appropriate eye contact and affect.  Skin:  No Rash  Data Review Lab Results  Component Value Date   HGBA1C 12.6 08/16/2016   HGBA1C 9.0 01/10/2016   HGBA1C 10.1 08/24/2015     Assessment & Plan   1. Type 2 diabetes mellitus with diabetic neuropathy, unspecified long term insulin use status (HCC) Uncontrolled with only partial compliance on medications. - POCT glycosylated hemoglobin (Hb A1C) - Glucose (CBG) - gabapentin (NEURONTIN) 300 MG capsule; Take 1 capsule (300 mg total) by mouth 3 (three) times daily.  Dispense: 90 capsule; Refill: 3 - omeprazole (PRILOSEC) 40 MG capsule; Take 1 capsule (40 mg total) by mouth daily.  Dispense: 30 capsule; Refill: 3 - Ambulatory referral to Podiatry  2. Pain in both lower extremities restart- gabapentin (NEURONTIN) 300 MG capsule; Take 1 capsule (300 mg total) by mouth 3 (three) times daily.  Dispense: 90 capsule; Refill: 3    3. Pain of toe of left foot Unsure etiology-no obvious ingrown nail/paronychyia/cellulitis and did not improve while on Doxycycline for another condition.  Pain outweighs physical findings - Ambulatory referral to Podiatry  4. Gastroesophageal reflux disease without esophagitis Omeprazole-Rx sent.  She was on this previously and said it worked well for her.     Patient have been counseled extensively about nutrition and exercise  Return in about 3 weeks (around 09/06/2016) for Dr Julien Nordmann DM and neuropathy f/up.  The patient was given clear instructions to go to ER or return to medical center if symptoms don't improve, worsen or new problems develop. The patient verbalized understanding. The patient was told to call to get lab results if they haven't heard anything in the next week.     Georgian Co, PA-C [facility] [place], [place] [phone]   08/16/2016, 10:16 AMPatient ID: Angelina Sheriff, female   DOB: [birth year], 36 y.o.   MRN: [id number]

## 2016-08-20 ENCOUNTER — Encounter (HOSPITAL_COMMUNITY): Payer: Self-pay | Admitting: Emergency Medicine

## 2016-08-20 ENCOUNTER — Ambulatory Visit (HOSPITAL_COMMUNITY)
Admission: EM | Admit: 2016-08-20 | Discharge: 2016-08-20 | Disposition: A | Discharge status: Home / Self Care | Payer: Medicaid Other | Attending: Family Medicine | Admitting: Family Medicine

## 2016-08-20 DIAGNOSIS — E1165 Type 2 diabetes mellitus with hyperglycemia: Secondary | ICD-10-CM

## 2016-08-20 DIAGNOSIS — R112 Nausea with vomiting, unspecified: Secondary | ICD-10-CM

## 2016-08-20 DIAGNOSIS — K529 Noninfective gastroenteritis and colitis, unspecified: Secondary | ICD-10-CM | POA: Diagnosis not present

## 2016-08-20 DIAGNOSIS — R197 Diarrhea, unspecified: Secondary | ICD-10-CM | POA: Diagnosis not present

## 2016-08-20 DIAGNOSIS — R1030 Lower abdominal pain, unspecified: Secondary | ICD-10-CM

## 2016-08-20 LAB — POCT I-STAT, CHEM 8
BUN: 12 mg/dL (ref 6–20)
CREATININE: 0.5 mg/dL (ref 0.44–1.00)
Calcium, Ion: 1.31 mmol/L (ref 1.15–1.40)
Chloride: 102 mmol/L (ref 101–111)
GLUCOSE: 394 mg/dL — AB (ref 65–99)
HCT: 45 % (ref 36.0–46.0)
Hemoglobin: 15.3 g/dL — ABNORMAL HIGH (ref 12.0–15.0)
Potassium: 4 mmol/L (ref 3.5–5.1)
Sodium: 139 mmol/L (ref 135–145)
TCO2: 22 mmol/L (ref 0–100)

## 2016-08-20 MED ORDER — ONDANSETRON HCL 4 MG PO TABS: 4.0000 mg | ORAL_TABLET | Freq: Four times a day (QID) | ORAL | 0 refills | Status: DC

## 2016-08-20 MED ORDER — ONDANSETRON 4 MG PO TBDP
ORAL_TABLET | ORAL | Status: AC
  Filled 2016-08-20: qty 1

## 2016-08-20 MED ORDER — ONDANSETRON 4 MG PO TBDP
4.0000 mg | ORAL_TABLET | Freq: Once | ORAL | Status: AC
  Administered 2016-08-20: 4 mg via ORAL

## 2016-08-20 NOTE — ED Triage Notes (Signed)
[facility] a [facility] of abdominal pain that she described as burning with N/V/D x 3 days.

## 2016-08-20 NOTE — ED Provider Notes (Signed)
CSN: [id number]     Arrival date & time 08/20/16  1910 History   First MD Initiated Contact with Patient 08/20/16 1955     Chief Complaint  Patient presents with  . Abdominal Pain   (Consider location/radiation/quality/duration/timing/severity/associated sxs/prior Treatment) 36 year old obese female with type 2 diabetes, hypertension, GERD and asthma presents to the urgent care with a 3 day history of vomiting and diarrhea. She states she is vomiting 3-4 times a day and having brown watery loose stools twice we 7 times a day. She is also complaining of burning in the abdomen particular which she has a bowel movement. Today she took MiraLAX thinking that was going to help with her diarrhea but it made it worse. Denies fevers or chills. Patient notes her blood sugars have been running in the 400s and 500s.      Past Medical History:  Diagnosis Date  . Asthma   . Diabetes mellitus without complication (HCC)   . GERD (gastroesophageal reflux disease)   . Hypertension    Past Surgical History:  Procedure Laterality Date  . CESAREAN SECTION    . TUBAL LIGATION    . WISDOM TOOTH EXTRACTION     Family History  Problem Relation Age of Onset  . Asthma Mother   . Asthma Father    Social History  Substance Use Topics  . Smoking status: Former Smoker    Packs/day: 0.00    Types: Cigarettes    Quit date: 11/02/2013  . Smokeless tobacco: Never Used  . Alcohol use No   OB History    Gravida Para Term Preterm AB Living   5 4     1 4    SAB TAB Ectopic Multiple Live Births   1             Review of Systems  Constitutional: Positive for activity change and fatigue. Negative for chills and fever.  HENT: Negative.   Respiratory: Negative.   Cardiovascular: Negative for chest pain.  Gastrointestinal: Positive for abdominal pain, diarrhea, nausea and vomiting.  Genitourinary: Negative.   Musculoskeletal: Negative.   Neurological: Negative.   All other systems reviewed and are  negative.   Allergies  Morphine and related; Metformin and related; Glyburide; Oxycodone; and Vicodin [hydrocodone-acetaminophen]  Home Medications   Prior to Admission medications   Medication Sig Start Date End Date Taking? Authorizing Provider  gabapentin (NEURONTIN) 300 MG capsule Take 1 capsule (300 mg total) by mouth 3 (three) times daily. 08/16/16  Yes Marzella Schlein McClung, PA-C  glipiZIDE (GLUCOTROL) 5 MG tablet Take 5 mg by mouth 1 day or 1 dose. 08/06/16  Yes Historical Provider, MD  insulin aspart (NOVOLOG) 100 UNIT/ML FlexPen Inject 10 Units into the skin daily. Take with largest meal. 08/16/16  Yes Anders Simmonds, PA-C  Insulin Glargine (LANTUS SOLOSTAR) 100 UNIT/ML Solostar Pen Inject 48 Units into the skin daily at 10 pm. 08/16/16  Yes Anders Simmonds, PA-C  omeprazole (PRILOSEC) 40 MG capsule Take 1 capsule (40 mg total) by mouth daily. 08/16/16  Yes Marzella Schlein McClung, PA-C  ondansetron (ZOFRAN) 4 MG tablet Take 1 tablet (4 mg total) by mouth every 6 (six) hours. 08/20/16   Hayden Rasmussen, NP   Meds Ordered and Administered this Visit   Medications  ondansetron (ZOFRAN-ODT) disintegrating tablet 4 mg (4 mg Oral Given 08/20/16 2019)    BP (!) 144/88 (BP Location: Right Arm)   Pulse 95   Temp 98.3 F (36.8 C) (Oral)   Resp 16  SpO2 100%  No data found.   Physical Exam  Constitutional: She is oriented to person, place, and time. She appears well-developed and well-nourished. No distress.  Eyes: EOM are normal.  Neck: Normal range of motion. Neck supple.  Cardiovascular: Normal rate, regular rhythm, normal heart sounds and intact distal pulses.   Pulmonary/Chest: Effort normal and breath sounds normal. No respiratory distress.  Abdominal: Soft. Bowel sounds are normal.  Tenderness all across the lower half of the abdomen. No rebound or guarding. No mass or hernia. After palpating the abdomen she states that made her abdomen hurt worse.  Musculoskeletal: She exhibits no edema.   Neurological: She is alert and oriented to person, place, and time.  Skin: Skin is warm.  Psychiatric: She has a normal mood and affect.  Nursing note and vitals reviewed.   Urgent Care Course     Procedures (including critical care time)  Labs Review Labs Reviewed  POCT I-STAT, CHEM 8 - Abnormal; Notable for the following:       Result Value   Glucose, Bld 394 (*)    Hemoglobin 15.3 (*)    All other components within normal limits   Results for orders placed or performed during the hospital encounter of 08/20/16  I-STAT, chem 8  Result Value Ref Range   Sodium 139 135 - 145 mmol/L   Potassium 4.0 3.5 - 5.1 mmol/L   Chloride 102 101 - 111 mmol/L   BUN 12 6 - 20 mg/dL   Creatinine, Ser 1.61 0.44 - 1.00 mg/dL   Glucose, Bld 096 (H) 65 - 99 mg/dL   Calcium, Ion 0.45 4.09 - 1.40 mmol/L   TCO2 22 0 - 100 mmol/L   Hemoglobin 15.3 (H) 12.0 - 15.0 g/dL   HCT 81.1 91.4 - 78.2 %     Imaging Review No results found.   Visual Acuity Review  Right Eye Distance:   Left Eye Distance:   Bilateral Distance:    Right Eye Near:   Left Eye Near:    Bilateral Near:         MDM   1. Gastroenteritis   2. Lower abdominal pain   3. Nausea vomiting and diarrhea   4. Type 2 diabetes mellitus with hyperglycemia, without long-term current use of insulin (HCC)    Patient did not want to go to emergency department for IV fluids. She states that she understands that if she continues to become dehydrated and her blood sugars go up she can develop ketoacidosis which can be very serious or lethal . This also may damage her kidneys. She states that she will take the medicine, Zofran and start drinking the Pedialyte and water and if she is not getting any better and wanted today she will go to the emergency department. Take the Zofran as directed for nausea and vomiting. Clear liquids and add Pedialyte. Small frequent amounts. You must replace the fluids that she lose. Continue to check  your blood sugars. When you go home take an extra 5 units of Humalog. You may take Imodium up to twice a day for your diarrhea to help slow her down. Do not take so much that you completely stop it because you will get worse . It is important that you follow these instructions. If in the next day or 2 you continue to have vomiting and diarrhea and unable to drink fluids you will need to go to the emergency department. Since you have diabetes with elevated blood sugar low water  volume can cause you to be very sick and damage her kidneys. You must continue to the emergency department if you are not improving. Meds ordered this encounter  Medications  . ondansetron (ZOFRAN-ODT) disintegrating tablet 4 mg  . ondansetron (ZOFRAN) 4 MG tablet    Sig: Take 1 tablet (4 mg total) by mouth every 6 (six) hours.    Dispense:  12 tablet    Refill:  0    Order Specific Question:   Supervising Provider    Answer:   Elvina SidleLAUENSTEIN, KURT [[id number]]   I have verbally reviewed the discharge instructions with the patient. A printed AVS was given to the patient.  All questions were answered prior to discharge. [id number].      Hayden Rasmussenavid [place]s, NP 08/20/16 2045    Hayden Rasmussenavid Latavious Bitter, NP 08/20/16 [phone]

## 2016-08-20 NOTE — Discharge Instructions (Signed)
Take the Zofran as directed for nausea and vomiting. Clear liquids and add Pedialyte. Small frequent amounts. You must replace the fluids that she lose. Continue to check your blood sugars. When you go home take an extra 5 units of Humalog. You may take Imodium up to twice a day for your diarrhea to help slow her down. Do not take so much that you completely stop it because you will get worse . It is important that you follow these instructions. If in the next day or 2 you continue to have vomiting and diarrhea and unable to drink fluids you will need to go to the emergency department. Since you have diabetes with elevated blood sugar low water volume can cause you to be very sick and damage her kidneys. You must continue to the emergency department if you are not improving.

## 2016-08-22 ENCOUNTER — Ambulatory Visit (HOSPITAL_COMMUNITY)
Admission: EM | Admit: 2016-08-22 | Discharge: 2016-08-22 | Disposition: A | Discharge status: Home / Self Care | Payer: Medicaid Other | Attending: Emergency Medicine | Admitting: Emergency Medicine

## 2016-08-22 ENCOUNTER — Encounter (HOSPITAL_COMMUNITY): Payer: Self-pay | Admitting: Emergency Medicine

## 2016-08-22 DIAGNOSIS — A09 Infectious gastroenteritis and colitis, unspecified: Secondary | ICD-10-CM

## 2016-08-22 DIAGNOSIS — R197 Diarrhea, unspecified: Secondary | ICD-10-CM

## 2016-08-22 MED ORDER — DIPHENOXYLATE-ATROPINE 2.5-0.025 MG PO TABS: 1.0000 | ORAL_TABLET | Freq: Four times a day (QID) | ORAL | 0 refills | Status: DC | PRN

## 2016-08-22 NOTE — ED Triage Notes (Signed)
Pt stated the loose diarrhea has been present since Thursday!

## 2016-08-22 NOTE — ED Provider Notes (Signed)
CSN: [id number]     Arrival date & time 08/22/16  1307 History   First MD Initiated Contact with Patient 08/22/16 1412     Chief Complaint  Patient presents with  . Diarrhea   (Consider location/radiation/quality/duration/timing/severity/associated sxs/prior Treatment) 36 year old female was seen in the urgent care 2 days ago for gastroenteritis. She states that her stomach discomfort, nausea and vomiting have abated. Overall she feels better, her diarrhea had slowed down until today when she ate her first meal which was a salad. Shortly after that she had 2 episodes of diarrhea. This prompted her to return to the urgent care. No abdominal pain. No fever or chills.      Past Medical History:  Diagnosis Date  . Asthma   . Diabetes mellitus without complication (HCC)   . GERD (gastroesophageal reflux disease)   . Hypertension    Past Surgical History:  Procedure Laterality Date  . CESAREAN SECTION    . TUBAL LIGATION    . WISDOM TOOTH EXTRACTION     Family History  Problem Relation Age of Onset  . Asthma Mother   . Asthma Father    Social History  Substance Use Topics  . Smoking status: Former Smoker    Packs/day: 0.00    Types: Cigarettes    Quit date: 11/02/2013  . Smokeless tobacco: Never Used  . Alcohol use No   OB History    Gravida Para Term Preterm AB Living   5 4     1 4    SAB TAB Ectopic Multiple Live Births   1             Review of Systems  Constitutional: Positive for activity change. Negative for appetite change and fever.  HENT: Negative.   Respiratory: Negative.   Gastrointestinal: Positive for diarrhea. Negative for abdominal pain, nausea and vomiting.  Genitourinary: Negative.   Neurological: Negative.   All other systems reviewed and are negative.   Allergies  Morphine and related; Metformin and related; Glyburide; Oxycodone; and Vicodin [hydrocodone-acetaminophen]  Home Medications   Prior to Admission medications   Medication Sig Start  Date End Date Taking? Authorizing Provider  diphenoxylate-atropine (LOMOTIL) 2.5-0.025 MG tablet Take 1 tablet by mouth 4 (four) times daily as needed for diarrhea or loose stools. Take one tablet up to 3 times a day for diarrhea. 08/22/16   Hayden Rasmussen, NP  gabapentin (NEURONTIN) 300 MG capsule Take 1 capsule (300 mg total) by mouth 3 (three) times daily. 08/16/16   Anders Simmonds, PA-C  glipiZIDE (GLUCOTROL) 5 MG tablet Take 5 mg by mouth 1 day or 1 dose. 08/06/16   Historical Provider, MD  insulin aspart (NOVOLOG) 100 UNIT/ML FlexPen Inject 10 Units into the skin daily. Take with largest meal. 08/16/16   Anders Simmonds, PA-C  Insulin Glargine (LANTUS SOLOSTAR) 100 UNIT/ML Solostar Pen Inject 48 Units into the skin daily at 10 pm. 08/16/16   Anders Simmonds, PA-C  omeprazole (PRILOSEC) 40 MG capsule Take 1 capsule (40 mg total) by mouth daily. 08/16/16   Anders Simmonds, PA-C  ondansetron (ZOFRAN) 4 MG tablet Take 1 tablet (4 mg total) by mouth every 6 (six) hours. 08/20/16   Hayden Rasmussen, NP   Meds Ordered and Administered this Visit  Medications - No data to display  BP 121/84 (BP Location: Left Arm)   Pulse (!) 123   Temp 98.8 F (37.1 C) (Oral)   Resp 20   SpO2 99%  No data found.  Physical Exam  Constitutional: She is oriented to person, place, and time. She appears well-developed and well-nourished. No distress.  Eyes: EOM are normal.  Neck: Normal range of motion. Neck supple.  Cardiovascular: Normal rate.   Pulmonary/Chest: Effort normal. No respiratory distress.  Abdominal: Soft. There is no tenderness.  Musculoskeletal: She exhibits no edema.  Neurological: She is alert and oriented to person, place, and time. She exhibits normal muscle tone.  Skin: Skin is warm and dry.  Psychiatric: She has a normal mood and affect.  Nursing note and vitals reviewed.   Urgent Care Course     Procedures (including critical care time)  Labs Review Labs Reviewed - No data to  display  Imaging Review No results found.   Visual Acuity Review  Right Eye Distance:   Left Eye Distance:   Bilateral Distance:    Right Eye Near:   Left Eye Near:    Bilateral Near:         MDM   1. Diarrhea of presumed infectious origin    Overall the patient is better with her symptoms. No more upper GI symptoms or vomiting. She is able to drink fluids. Today she started food with salad. Shortly after eating a salad she had a couple of episodes of diarrhea. I suspect that she is getting towards the end of this gastroenteritis. She is on her fifth day. She has been given a prescription for Lomotil and advised to use it very sparingly and only up to 3 times a day maximum and only when necessary. Recommend not eating roughage type foods for now. Try soups and maybe just a couple of crackers. Stop taking the Imodium AD. You are given a prescription for a medicine called Lomotil. It is stronger than the Immodium. You have to be careful taking it and not take it too much. Do not take more than 3 times a day and if you can take less that would be better. You must continue to drink plenty of liquids. You are still a little dehydrated. The sure to see your doctor on Friday.  Meds ordered this encounter  Medications  . diphenoxylate-atropine (LOMOTIL) 2.5-0.025 MG tablet    Sig: Take 1 tablet by mouth 4 (four) times daily as needed for diarrhea or loose stools. Take one tablet up to 3 times a day for diarrhea.    Dispense:  12 tablet    Refill:  0    Order Specific Question:   Supervising Provider    Answer:   Domenick GongMORTENSON, ASHLEY [[id number]]   I have verbally reviewed the discharge instructions with the patient. A printed AVS was given to the patient.  All questions were answered prior to discharge.      Hayden Rasmussenavid Tabytha Gradillas, NP 08/22/16 1454    Hayden Rasmussenavid Charley Lafrance, NP 08/22/16 1534

## 2016-08-22 NOTE — Discharge Instructions (Signed)
Recommend not eating roughage type foods for now. Try soups and maybe just a couple of crackers. Stop taking the Imodium AD. You are given a prescription for a medicine called Lomotil. It is more powerful in the Imodium right ear. You have to be careful taking it and not take it too much. Do not take more than 3 times a day and if you can take less that would be better. You must continue to drink plenty of liquids. You are still a little dehydrated. The sure to see your doctor on Friday.

## 2016-08-27 ENCOUNTER — Telehealth: Payer: Self-pay | Admitting: Internal Medicine

## 2016-08-27 NOTE — Telephone Encounter (Signed)
Patient came by the office to drop off FMLA documents that she needs PCP to fill out. Please fax it when it's ready and inform patient. Leaving documents on provider's mailbox.   Thank you.

## 2016-08-27 NOTE — Telephone Encounter (Signed)
Patient came by the office to drop off FMLA documents that she needs PCP to fill out. Please fax it when it's ready and inform patient. Leaving documents on provider's mailbox.  ° °Thank you.  °

## 2016-08-28 ENCOUNTER — Emergency Department (HOSPITAL_COMMUNITY): Payer: Medicaid Other

## 2016-08-28 ENCOUNTER — Emergency Department (HOSPITAL_COMMUNITY)
Admission: EM | Admit: 2016-08-28 | Discharge: 2016-08-29 | Disposition: A | Discharge status: Home / Self Care | Payer: Medicaid Other | Attending: Emergency Medicine | Admitting: Emergency Medicine

## 2016-08-28 ENCOUNTER — Other Ambulatory Visit: Payer: Self-pay | Admitting: Family Medicine

## 2016-08-28 ENCOUNTER — Encounter (HOSPITAL_COMMUNITY): Payer: Self-pay | Admitting: Emergency Medicine

## 2016-08-28 DIAGNOSIS — E1140 Type 2 diabetes mellitus with diabetic neuropathy, unspecified: Secondary | ICD-10-CM | POA: Insufficient documentation

## 2016-08-28 DIAGNOSIS — M79672 Pain in left foot: Secondary | ICD-10-CM

## 2016-08-28 DIAGNOSIS — Z794 Long term (current) use of insulin: Secondary | ICD-10-CM | POA: Diagnosis not present

## 2016-08-28 DIAGNOSIS — Z79899 Other long term (current) drug therapy: Secondary | ICD-10-CM | POA: Diagnosis not present

## 2016-08-28 DIAGNOSIS — Z87891 Personal history of nicotine dependence: Secondary | ICD-10-CM | POA: Diagnosis not present

## 2016-08-28 DIAGNOSIS — I10 Essential (primary) hypertension: Secondary | ICD-10-CM | POA: Diagnosis not present

## 2016-08-28 DIAGNOSIS — J45909 Unspecified asthma, uncomplicated: Secondary | ICD-10-CM | POA: Diagnosis not present

## 2016-08-28 NOTE — Telephone Encounter (Signed)
Called patient to ask about FMLA paperwork and she was unavailable. If she calls back please ask her the reason why she is  requesting FMLA? For what condition and what circumstances?

## 2016-08-28 NOTE — ED Notes (Signed)
Pt back from xray. Pt had adequate pedal pulse. Pt has pain around her left great toe. Pt denies trauma. Pt states pain began tonight

## 2016-08-28 NOTE — ED Triage Notes (Signed)
Pt reports having left foot pain that started today with no known injury. Pt reports pain and swelling to bottom of foot and into ankle. Pt stated that she is scheduled to see podiatry this week but could no make it until her appt.

## 2016-08-29 MED ORDER — NAPROXEN 500 MG PO TABS
500.0000 mg | ORAL_TABLET | Freq: Once | ORAL | Status: AC
  Administered 2016-08-29: 500 mg via ORAL
  Filled 2016-08-29: qty 1

## 2016-08-29 MED ORDER — NAPROXEN 500 MG PO TABS: 500.0000 mg | ORAL_TABLET | Freq: Two times a day (BID) | ORAL | 0 refills | Status: DC

## 2016-08-29 NOTE — Telephone Encounter (Signed)
Spoke with patient & she stated that she needs to be out of work because of her pain in her leg & being swelling also for having high Blood sugar in the 500 she been taking her medications & eating good she doesn't know why her BS is still high  she went to the ED on 08/28/2016 & she doesn't not want to have point in her job for being out

## 2016-08-29 NOTE — Discharge Instructions (Signed)
Keep your scheduled appointment with podiatry (the foot doctor) this week.

## 2016-08-29 NOTE — ED Provider Notes (Signed)
[facility]-EMERGENCY DEPT Provider Note   CSN: [id number] Arrival date & time: 08/28/16  2306     History   Chief Complaint Chief Complaint  Patient presents with  . Foot Pain    HPI Sarah Garrison is a 36 y.o. female.  Patient presents for evaluation of left foot pain and swelling that woke her this morning at around 4:00 am. No known injury. No fever, calf pain.    The history is provided by the patient. No language interpreter was used.  Foot Pain     Past Medical History:  Diagnosis Date  . Asthma   . Diabetes mellitus without complication (HCC)   . GERD (gastroesophageal reflux disease)   . Hypertension     Patient Active Problem List   Diagnosis Date Noted  . Asthma 07/30/2016  . Uses depot medroxyprogesterone acetate as primary birth control method 05/07/2016  . Encounter for pregnancy test 02/01/2016  . Leg swelling 09/29/2015  . Morbid obesity (HCC) 09/29/2015  . DM neuropathy, type II diabetes mellitus (HCC) 08/24/2015    Past Surgical History:  Procedure Laterality Date  . CESAREAN SECTION    . TUBAL LIGATION    . WISDOM TOOTH EXTRACTION      OB History    Gravida Para Term Preterm AB Living   5 4     1 4    SAB TAB Ectopic Multiple Live Births   1               Home Medications    Prior to Admission medications   Medication Sig Start Date End Date Taking? Authorizing Provider  diphenoxylate-atropine (LOMOTIL) 2.5-0.025 MG tablet Take 1 tablet by mouth 4 (four) times daily as needed for diarrhea or loose stools. Take one tablet up to 3 times a day for diarrhea. 08/22/16   Hayden Rasmussen, NP  gabapentin (NEURONTIN) 300 MG capsule Take 1 capsule (300 mg total) by mouth 3 (three) times daily. 08/16/16   Anders Simmonds, PA-C  glipiZIDE (GLUCOTROL) 5 MG tablet Take 5 mg by mouth 1 day or 1 dose. 08/06/16   Historical Provider, MD  insulin aspart (NOVOLOG) 100 UNIT/ML FlexPen Inject 10 Units into the skin daily. Take with largest meal. 08/16/16   Anders Simmonds, PA-C  Insulin Glargine (LANTUS SOLOSTAR) 100 UNIT/ML Solostar Pen Inject 48 Units into the skin daily at 10 pm. 08/16/16   Anders Simmonds, PA-C  omeprazole (PRILOSEC) 40 MG capsule Take 1 capsule (40 mg total) by mouth daily. 08/16/16   Anders Simmonds, PA-C  ondansetron (ZOFRAN) 4 MG tablet Take 1 tablet (4 mg total) by mouth every 6 (six) hours. 08/20/16   Hayden Rasmussen, NP    Family History Family History  Problem Relation Age of Onset  . Asthma Mother   . Asthma Father     Social History Social History  Substance Use Topics  . Smoking status: Former Smoker    Packs/day: 0.00    Types: Cigarettes    Quit date: 11/02/2013  . Smokeless tobacco: Never Used  . Alcohol use No     Allergies   Morphine and related; Metformin and related; Glyburide; Oxycodone; and Vicodin [hydrocodone-acetaminophen]   Review of Systems Review of Systems  Constitutional: Negative for fever.  Musculoskeletal:       See HPI.  Skin: Negative for color change.  Neurological: Negative for numbness.     Physical Exam Updated Vital Signs BP (!) 129/97 (BP Location: Left Arm)   Pulse Marland Kitchen)  102   Temp 97.9 F (36.6 C) (Oral)   Resp 17   Ht 5\' 4"  (1.626 m)   Wt 102.1 kg   SpO2 95%   BMI 38.62 kg/m   Physical Exam  Constitutional: She is oriented to person, place, and time. She appears well-developed and well-nourished.  Neck: Normal range of motion.  Pulmonary/Chest: Effort normal.  Musculoskeletal:       Feet:  Left foot is mildly swollen without erythema or bony deformity. Extremely tender to touch.  Neurological: She is alert and oriented to person, place, and time.  Skin: Skin is warm and dry.     ED Treatments / Results  Labs (all labs ordered are listed, but only abnormal results are displayed) Labs Reviewed - No data to display  EKG  EKG Interpretation None       Radiology Dg Foot Complete Left  Result Date: 08/28/2016 CLINICAL DATA:  Pain at the calcaneal  tarsal joint. Swelling lateral to malleolus x2 days. EXAM: LEFT FOOT - COMPLETE 3+ VIEW COMPARISON:  05/31/2007 FINDINGS: There is no evidence of fracture or dislocation. Small calcaneal enthesophytes are noted. There is no evidence of arthropathy or other focal bone abnormality. No definite subtalar coalition. Soft tissues are unremarkable. IMPRESSION: No acute osseous abnormality. No definite coalition. Calcaneal enthesophytes are present. Electronically Signed   By: Tollie Ethavid  Kwon M.D.   On: 08/28/2016 23:57    Procedures Procedures (including critical care time)  Medications Ordered in ED Medications - No data to display   Initial Impression / Assessment and Plan / ED Course  I have reviewed the triage vital signs and the nursing notes.  Pertinent labs & imaging results that were available during my care of the patient were reviewed by me and considered in my medical decision making (see chart for details).     Patient with sudden onset swelling and pain in left foot. No fever or exam evidence of infection. No vascular comprise. No calf pain, swelling or tenderness.   Will treat with supportive care. Norco #6 only, encouraged to continue Naproxen. She has podiatry appointment on Friday of this week.   Final Clinical Impressions(s) / ED Diagnoses   Final diagnoses:  None   1. Left foot pain   New Prescriptions New Prescriptions   No medications on file     Elpidio AnisShari Jermiya Reichl, Cordelia Poche-C 08/29/16 0031    Arby BarretteMarcy Pfeiffer, MD 09/02/16 [phone]

## 2016-08-29 NOTE — Telephone Encounter (Signed)
CMA call patient to get more information about FMLA paperwork  Patient was aware and understood

## 2016-08-30 ENCOUNTER — Other Ambulatory Visit: Payer: Self-pay | Admitting: Family Medicine

## 2016-08-31 ENCOUNTER — Ambulatory Visit: Payer: Medicaid Other

## 2016-08-31 ENCOUNTER — Ambulatory Visit (INDEPENDENT_AMBULATORY_CARE_PROVIDER_SITE_OTHER): Payer: Medicaid Other | Admitting: Podiatry

## 2016-08-31 ENCOUNTER — Encounter: Payer: Self-pay | Admitting: Podiatry

## 2016-08-31 DIAGNOSIS — E1149 Type 2 diabetes mellitus with other diabetic neurological complication: Secondary | ICD-10-CM

## 2016-08-31 DIAGNOSIS — E1140 Type 2 diabetes mellitus with diabetic neuropathy, unspecified: Secondary | ICD-10-CM | POA: Diagnosis not present

## 2016-08-31 DIAGNOSIS — M79675 Pain in left toe(s): Secondary | ICD-10-CM

## 2016-08-31 MED ORDER — GABAPENTIN 600 MG PO TABS: 600.0000 mg | ORAL_TABLET | Freq: Two times a day (BID) | ORAL | 3 refills | Status: DC

## 2016-08-31 MED ORDER — GABAPENTIN 300 MG PO CAPS: 300.0000 mg | ORAL_CAPSULE | Freq: Every day | ORAL | 3 refills | Status: DC

## 2016-08-31 NOTE — Progress Notes (Signed)
   Subjective:    Patient ID: Sarah Garrison, female    DOB: 11/09/1980, 36 y.o.   MRN: [id number]  HPI: She presents complaining of pain to the first toe and first metatarsophalangeal joint with an electric shock-type sensation for approximately 1 year. She denies any trauma. She just recently started to develop some heel pain with similar symptoms with radiating pain. She went to the emergency department to be checked for blood clot because swelling in both legs which was negative. They diagnosed her with a paronychia hallux left and placed her on doxycycline. She has diabetes poorly controlled with a hemoglobin A1c last measured at 13.2. She states that her blood sugar on a daily basis is over 500.    Review of Systems  Cardiovascular: Positive for leg swelling.  Musculoskeletal: Positive for gait problem and myalgias.  All other systems reviewed and are negative.      Objective:   Physical Exam: She presents today vital signs stable alert and oriented 3. Pulses are palpable. Neurologic sensorium is diminished percent as well as a monofilament.. Deep tendon reflexes are intact. Muscle strength is 5 over 5 was a flexed plantar flexors and inverters everters on his musculature is intact she has severe pain on palpation of the plantar medial aspect of the first metatarsophalangeal joint and with light touch. I see no signs of paronychia. Radiographs do not demonstrate any foreign body or any abnormalities of the joints or bones.       Assessment & Plan:  Assessment worsening neuropathy and cannot rule out a cutaneous neuroma.  Plan: Increased her gabapentin at this time place padding. Gabapentin 600 mg in the morning 600 mg at lunch 300 mg at bedtime.

## 2016-09-03 ENCOUNTER — Encounter (HOSPITAL_COMMUNITY): Payer: Self-pay | Admitting: Emergency Medicine

## 2016-09-03 ENCOUNTER — Emergency Department (HOSPITAL_COMMUNITY)
Admission: EM | Admit: 2016-09-03 | Discharge: 2016-09-03 | Disposition: A | Discharge status: Home / Self Care | Payer: Medicaid Other | Attending: Emergency Medicine | Admitting: Emergency Medicine

## 2016-09-03 DIAGNOSIS — E1140 Type 2 diabetes mellitus with diabetic neuropathy, unspecified: Secondary | ICD-10-CM | POA: Insufficient documentation

## 2016-09-03 DIAGNOSIS — G629 Polyneuropathy, unspecified: Secondary | ICD-10-CM

## 2016-09-03 DIAGNOSIS — Z794 Long term (current) use of insulin: Secondary | ICD-10-CM | POA: Insufficient documentation

## 2016-09-03 DIAGNOSIS — I10 Essential (primary) hypertension: Secondary | ICD-10-CM | POA: Insufficient documentation

## 2016-09-03 DIAGNOSIS — R739 Hyperglycemia, unspecified: Secondary | ICD-10-CM

## 2016-09-03 DIAGNOSIS — Z87891 Personal history of nicotine dependence: Secondary | ICD-10-CM | POA: Insufficient documentation

## 2016-09-03 DIAGNOSIS — J45909 Unspecified asthma, uncomplicated: Secondary | ICD-10-CM | POA: Insufficient documentation

## 2016-09-03 DIAGNOSIS — E1165 Type 2 diabetes mellitus with hyperglycemia: Secondary | ICD-10-CM | POA: Insufficient documentation

## 2016-09-03 DIAGNOSIS — M79675 Pain in left toe(s): Secondary | ICD-10-CM

## 2016-09-03 LAB — CBG MONITORING, ED: Glucose-Capillary: 242 mg/dL — ABNORMAL HIGH (ref 65–99)

## 2016-09-03 MED ORDER — ACETAMINOPHEN 500 MG PO TABS: 500.0000 mg | ORAL_TABLET | Freq: Four times a day (QID) | ORAL | 0 refills | Status: DC | PRN

## 2016-09-03 NOTE — ED Triage Notes (Signed)
Pt complaint of acute chronic left great toe pain for a year.

## 2016-09-03 NOTE — ED Provider Notes (Signed)
[facility]-EMERGENCY DEPT Provider Note   CSN: [id number] Arrival date & time: 09/03/16  1501  By signing my name below, I, Sonum Patel, attest that this documentation has been prepared under the direction and in the presence of Will Desteni Piscopo, PA-C. Electronically Signed: Sonum Patel, Neurosurgeon. 09/03/16. 4:32 PM.  History   Chief Complaint Chief Complaint  Patient presents with  . Toe Pain    The history is provided by the patient and medical records. No language interpreter was used.     HPI Comments: Sarah Garrison is a 36 y.o. female who presents to the Emergency Department complaining of unchanged left great toe pain that has been ongoing for the past 1  year but has gradually worsened recently. She reports recent associated swelling and discomfort when walking. She denies known injury or trauma to the affected area. She is prescribed gabapentin for diabetic neuropathy which she has been taking without relief. She was seen by a podiatrist on 08/31/16 (three days ago) and had the gabapentin increased. She has follow up with PCP on 09/11/16 and podiatry on 09/19/16.   Past Medical History:  Diagnosis Date  . Asthma   . Diabetes mellitus without complication (HCC)   . GERD (gastroesophageal reflux disease)   . Hypertension     Patient Active Problem List   Diagnosis Date Noted  . Asthma 07/30/2016  . Uses depot medroxyprogesterone acetate as primary birth control method 05/07/2016  . Encounter for pregnancy test 02/01/2016  . Leg swelling 09/29/2015  . Morbid obesity (HCC) 09/29/2015  . DM neuropathy, type II diabetes mellitus (HCC) 08/24/2015    Past Surgical History:  Procedure Laterality Date  . CESAREAN SECTION    . TUBAL LIGATION    . WISDOM TOOTH EXTRACTION      OB History    Gravida Para Term Preterm AB Living   SAB TAB Ectopic Multiple Live Births   1               Home Medications    Prior to Admission medications   Medication Sig Start Date End  Date Taking? Authorizing Provider  ACCU-CHEK AVIVA PLUS test strip  07/20/16   Historical Provider, MD  ACCU-CHEK SOFTCLIX LANCETS lancets  07/20/16   Historical Provider, MD  acetaminophen (TYLENOL) 500 MG tablet Take 1 tablet (500 mg total) by mouth every 6 (six) hours as needed. 09/03/16   Everlene Farrier, PA-C  diphenoxylate-atropine (LOMOTIL) 2.5-0.025 MG tablet Take 1 tablet by mouth 4 (four) times daily as needed for diarrhea or loose stools. Take one tablet up to 3 times a day for diarrhea. 08/22/16   Hayden Rasmussen, NP  FLOVENT [facility] 110 MCG/ACT inhaler  07/31/16   Historical Provider, MD  gabapentin (NEURONTIN) 300 MG capsule Take 1 capsule (300 mg total) by mouth at bedtime. 08/31/16   Max T Hyatt, DPM  gabapentin (NEURONTIN) 600 MG tablet Take 1 tablet (600 mg total) by mouth 2 (two) times daily. 08/31/16   Max T Hyatt, DPM  glimepiride (AMARYL) 2 MG tablet  07/27/16   Historical Provider, MD  glipiZIDE (GLUCOTROL) 5 MG tablet Take 5 mg by mouth 1 day or 1 dose. 08/06/16   Historical Provider, MD  insulin aspart (NOVOLOG) 100 UNIT/ML FlexPen Inject 10 Units into the skin daily. Take with largest meal. 08/16/16   Anders Simmonds, PA-C  Insulin Glargine (LANTUS SOLOSTAR) 100 UNIT/ML Solostar Pen Inject 48 Units into the skin daily  at 10 pm. 08/16/16   Anders Simmonds, PA-C  lisinopril (PRINIVIL,ZESTRIL) 10 MG tablet  07/27/16   Historical Provider, MD  meloxicam (MOBIC) 7.5 MG tablet TK 1 T PO BID 08/14/16   Historical Provider, MD  montelukast (SINGULAIR) 10 MG tablet  07/31/16   Historical Provider, MD  naproxen (NAPROSYN) 500 MG tablet Take 1 tablet (500 mg total) by mouth 2 (two) times daily. 08/29/16   Elpidio Anis, PA-C  omeprazole (PRILOSEC) 40 MG capsule Take 1 capsule (40 mg total) by mouth daily. 08/16/16   Anders Simmonds, PA-C  ondansetron (ZOFRAN) 4 MG tablet Take 1 tablet (4 mg total) by mouth every 6 (six) hours. 08/20/16   Hayden Rasmussen, NP  PROVENTIL HFA 108 [phone] Base) MCG/ACT inhaler  07/20/16    Historical Provider, MD  TRUEPLUS PEN NEEDLES 32G X 4 MM MISC  07/27/16   Historical Provider, MD    Family History Family History  Problem Relation Age of Onset  . Asthma Mother   . Asthma Father     Social History Social History  Substance Use Topics  . Smoking status: Former Smoker    Packs/day: 0.00    Types: Cigarettes    Quit date: 11/02/2013  . Smokeless tobacco: Never Used  . Alcohol use No     Allergies   Morphine and related; Metformin and related; Glyburide; Oxycodone; and Vicodin [hydrocodone-acetaminophen]   Review of Systems Review of Systems  Constitutional: Negative for appetite change, chills and fever.  Endocrine: Negative for polyuria.  Musculoskeletal: Positive for arthralgias and joint swelling.  Skin: Negative for rash and wound.  Neurological: Negative for weakness and numbness.     Physical Exam Updated Vital Signs BP 130/84 (BP Location: Right Arm)   Pulse (!) 111   Temp 98.2 F (36.8 C) (Oral)   Resp 18   SpO2 98%   Physical Exam  Constitutional: She appears well-developed and well-nourished. No distress.  Nontoxic appearing.  HENT:  Head: Normocephalic and atraumatic.  Mouth/Throat: Oropharynx is clear and moist.  Eyes: Right eye exhibits no discharge. Left eye exhibits no discharge.  Cardiovascular: Normal rate, regular rhythm and intact distal pulses.   HR is 92.  Bilateral dorsalis pedis and posterior tibialis pulses are intact.  Pulmonary/Chest: Effort normal. No respiratory distress.  Musculoskeletal: Normal range of motion. She exhibits tenderness. She exhibits no edema or deformity.  Tenderness overlying her left great toe. No ecchymosis or edema. No evidence of perionychia or injury. Good range of motion of her toes. No evidence of joint infection.  Neurological: She is alert. No sensory deficit. Coordination normal.  Skin: Skin is warm and dry. Capillary refill takes less than 2 seconds. No rash noted. She is not  diaphoretic. No erythema. No pallor.  Psychiatric: She has a normal mood and affect. Her behavior is normal.  Nursing note and vitals reviewed.    ED Treatments / Results  DIAGNOSTIC STUDIES: Oxygen Saturation is 98% on RA, normal by my interpretation.    COORDINATION OF CARE: 4:25 PM Discussed treatment plan with pt at bedside and pt agreed to plan.   Labs (all labs ordered are listed, but only abnormal results are displayed) Labs Reviewed  CBG MONITORING, ED - Abnormal; Notable for the following:       Result Value   Glucose-Capillary 242 (*)    All other components within normal limits    EKG  EKG Interpretation None       Radiology No results found.  Procedures Procedures (including critical care time)  Medications Ordered in ED Medications - No data to display   Initial Impression / Assessment and Plan / ED Course  I have reviewed the triage vital signs and the nursing notes.  Pertinent labs & imaging results that were available during my care of the patient were reviewed by me and considered in my medical decision making (see chart for details).     This is a 36 y.o. female who presents to the Emergency Department complaining of unchanged left great toe pain that has been ongoing for the past 1  year but has gradually worsened recently. She reports recent associated swelling and discomfort when walking. She denies known injury or trauma to the affected area. She is prescribed gabapentin for diabetic neuropathy which she has been taking without relief. She was seen by a podiatrist on 08/31/16 (three days ago) and had the gabapentin increased.  On chart review the patient saw podiatry 3 days ago. She had x-rays performed. I reviewed these x-rays. Podiatry believes this is related to neuropathy. They increased her gabapentin. We also reported that her pain may be related to neuroma. They will follow-up with her in 2 weeks. On exam patient is afebrile nontoxic  appearing. Her glucose here is 242. Discussed that her blood sugar is elevated today. Patient reports that this is actually better than her blood sugar usually is per she's been working hard to decrease her blood sugars. She has follow-up next week with primary care. I encouraged her to discuss her medications options for diabetes control. She agrees. She has increased her gabapentin for pain. We'll provide her also with Tylenol for pain control and encouraged her to follow-up closely with podiatry and primary care. Patient agrees with plan. I advised the patient to follow-up with their primary care provider this week. I advised the patient to return to the emergency department with new or worsening symptoms or new concerns. The patient verbalized understanding and agreement with plan.     Final Clinical Impressions(s) / ED Diagnoses   Final diagnoses:  Great toe pain, left  Neuropathy (HCC)  Hyperglycemia    New Prescriptions New Prescriptions   ACETAMINOPHEN (TYLENOL) 500 MG TABLET    Take 1 tablet (500 mg total) by mouth every 6 (six) hours as needed.    I personally performed the services described in this documentation, which was scribed in my presence. The recorded information has been reviewed and is accurate.      Everlene Farrier, PA-C 09/03/16 1637    Shaune Pollack, MD 09/05/16 [phone]

## 2016-09-05 ENCOUNTER — Emergency Department (HOSPITAL_COMMUNITY)
Admission: EM | Admit: 2016-09-05 | Discharge: 2016-09-05 | Disposition: A | Discharge status: Home / Self Care | Payer: Medicaid Other | Attending: Emergency Medicine | Admitting: Emergency Medicine

## 2016-09-05 ENCOUNTER — Encounter (HOSPITAL_COMMUNITY): Payer: Self-pay

## 2016-09-05 DIAGNOSIS — I10 Essential (primary) hypertension: Secondary | ICD-10-CM | POA: Insufficient documentation

## 2016-09-05 DIAGNOSIS — E119 Type 2 diabetes mellitus without complications: Secondary | ICD-10-CM | POA: Insufficient documentation

## 2016-09-05 DIAGNOSIS — T426X5A Adverse effect of other antiepileptic and sedative-hypnotic drugs, initial encounter: Secondary | ICD-10-CM | POA: Insufficient documentation

## 2016-09-05 DIAGNOSIS — T50905A Adverse effect of unspecified drugs, medicaments and biological substances, initial encounter: Secondary | ICD-10-CM

## 2016-09-05 DIAGNOSIS — Z794 Long term (current) use of insulin: Secondary | ICD-10-CM | POA: Insufficient documentation

## 2016-09-05 DIAGNOSIS — Y69 Unspecified misadventure during surgical and medical care: Secondary | ICD-10-CM | POA: Insufficient documentation

## 2016-09-05 DIAGNOSIS — J45909 Unspecified asthma, uncomplicated: Secondary | ICD-10-CM | POA: Insufficient documentation

## 2016-09-05 DIAGNOSIS — R1030 Lower abdominal pain, unspecified: Secondary | ICD-10-CM | POA: Insufficient documentation

## 2016-09-05 DIAGNOSIS — T887XXA Unspecified adverse effect of drug or medicament, initial encounter: Secondary | ICD-10-CM | POA: Insufficient documentation

## 2016-09-05 DIAGNOSIS — Z87891 Personal history of nicotine dependence: Secondary | ICD-10-CM | POA: Insufficient documentation

## 2016-09-05 DIAGNOSIS — Z79899 Other long term (current) drug therapy: Secondary | ICD-10-CM | POA: Insufficient documentation

## 2016-09-05 MED ORDER — DICYCLOMINE HCL 20 MG PO TABS: 20.0000 mg | ORAL_TABLET | Freq: Two times a day (BID) | ORAL | 0 refills | Status: DC

## 2016-09-05 MED ORDER — DICYCLOMINE HCL 10 MG PO CAPS
20.0000 mg | ORAL_CAPSULE | Freq: Once | ORAL | Status: AC
  Administered 2016-09-05: 20 mg via ORAL
  Filled 2016-09-05: qty 2

## 2016-09-05 NOTE — Discharge Instructions (Signed)
Your complaints today may be due to the increase in gabapentin dosing. Please revert back to your previous dosing regimen until you can follow up with your primary care provider or podiatrist. May use the Bentyl for abdominal discomfort. Initiate a liquid diet for the next 24 hours. Return to the ED should symptoms worsen.

## 2016-09-05 NOTE — ED Triage Notes (Signed)
Pt's dose of gabapentin was changed on the 30th and since then she has had stomach cramps and diarrhea, her dose before didn't effect her. She has no other sx or changes in her routine

## 2016-09-05 NOTE — ED Provider Notes (Signed)
[facility]-EMERG[facility] DEPT Provider Note   CSN: [id number] Arrival date & time: 09/05/16  1930  By signing my name below, I, Freida Busman, attest that this documentation has been prepared under the direction and in the presence of Peniel Hass, PA-C. Electronically Signed: Freida Busman, Scribe. 09/05/2016. 8:39 PM.  History   Chief Complaint Chief Complaint  Patient presents with  . Medication Reaction    The history is provided by the patient. No language interpreter was used.    HPI Comments:  Sarah Garrison is a 36 y.o. female who presents to the Emergency Department complaining of burning lower abdominal pain with associated diarrhea x 1 day. She notes her Gabapentin regimen was increased on 08/31/2016 to 3 pills in the AM and 3 pills at night. However, she was unable to fill the new prescription until yesterday and therefore did not start it until last night before bed. She reports 6-7 watery BMs today. She notes mild relief of abdominal pain afterward. Her last BM was ~ 1 hour ago. She did not take her morning gabapentin dose. She denies fever/chills, vomiting, hematochezia, urinary complaints, abnormal vaginal discharge or bleeding, or any other complaints.  Her LNMP was in August 2017 due to Depo; states this is normal for her. She is not sexually active.    Past Medical History:  Diagnosis Date  . Asthma   . Diabetes mellitus without complication (HCC)   . GERD (gastroesophageal reflux disease)   . Hypertension     Patient Active Problem List   Diagnosis Date Noted  . Asthma 07/30/2016  . Uses depot medroxyprogesterone acetate as primary birth control method 05/07/2016  . Encounter for pregnancy test 02/01/2016  . Leg swelling 09/29/2015  . Morbid obesity (HCC) 09/29/2015  . DM neuropathy, type II diabetes mellitus (HCC) 08/24/2015    Past Surgical History:  Procedure Laterality Date  . CESAREAN SECTION    . TUBAL LIGATION    . WISDOM TOOTH EXTRACTION      OB History     Gravida Para Term Preterm AB Living   SAB TAB Ectopic Multiple Live Births   1               Home Medications    Prior to Admission medications   Medication Sig Start Date End Date Taking? Authorizing Provider  ACCU-CHEK AVIVA PLUS test strip  07/20/16   Historical Provider, MD  ACCU-CHEK SOFTCLIX LANCETS lancets  07/20/16   Historical Provider, MD  acetaminophen (TYLENOL) 500 MG tablet Take 1 tablet (500 mg total) by mouth every 6 (six) hours as needed. 09/03/16   Everlene Farrier, PA-C  dicyclomine (BENTYL) 20 MG tablet Take 1 tablet (20 mg total) by mouth 2 (two) times daily. 09/05/16   Siyah Mault C Alexandre Faries, PA-C  diphenoxylate-atropine (LOMOTIL) 2.5-0.025 MG tablet Take 1 tablet by mouth 4 (four) times daily as needed for diarrhea or loose stools. Take one tablet up to 3 times a day for diarrhea. 08/22/16   Hayden Rasmussen, NP  FLOVENT [facility] 110 MCG/ACT inhaler  07/31/16   Historical Provider, MD  gabapentin (NEURONTIN) 300 MG capsule Take 1 capsule (300 mg total) by mouth at bedtime. 08/31/16   Max T Hyatt, DPM  gabapentin (NEURONTIN) 600 MG tablet Take 1 tablet (600 mg total) by mouth 2 (two) times daily. 08/31/16   Max T Hyatt, DPM  glimepiride (AMARYL) 2 MG tablet  07/27/16   Historical Provider, MD  glipiZIDE (  GLUCOTROL) 5 MG tablet Take 5 mg by mouth 1 day or 1 dose. 08/06/16   Historical Provider, MD  insulin aspart (NOVOLOG) 100 UNIT/ML FlexPen Inject 10 Units into the skin daily. Take with largest meal. 08/16/16   Anders Simmonds, PA-C  Insulin Glargine (LANTUS SOLOSTAR) 100 UNIT/ML Solostar Pen Inject 48 Units into the skin daily at 10 pm. 08/16/16   Anders Simmonds, PA-C  lisinopril (PRINIVIL,ZESTRIL) 10 MG tablet  07/27/16   Historical Provider, MD  meloxicam (MOBIC) 7.5 MG tablet TK 1 T PO BID 08/14/16   Historical Provider, MD  montelukast (SINGULAIR) 10 MG tablet  07/31/16   Historical Provider, MD  naproxen (NAPROSYN) 500 MG tablet Take 1 tablet (500 mg total) by mouth 2 (two) times  daily. 08/29/16   Elpidio Anis, PA-C  omeprazole (PRILOSEC) 40 MG capsule Take 1 capsule (40 mg total) by mouth daily. 08/16/16   Anders Simmonds, PA-C  ondansetron (ZOFRAN) 4 MG tablet Take 1 tablet (4 mg total) by mouth every 6 (six) hours. 08/20/16   Hayden Rasmussen, NP  PROVENTIL HFA 108 [phone] Base) MCG/ACT inhaler  07/20/16   Historical Provider, MD  TRUEPLUS PEN NEEDLES 32G X 4 MM MISC  07/27/16   Historical Provider, MD    Family History Family History  Problem Relation Age of Onset  . Asthma Mother   . Asthma Father     Social History Social History  Substance Use Topics  . Smoking status: Former Smoker    Packs/day: 0.00    Types: Cigarettes    Quit date: 11/02/2013  . Smokeless tobacco: Never Used  . Alcohol use No     Allergies   Morphine and related; Metformin and related; Glyburide; Oxycodone; and Vicodin [hydrocodone-acetaminophen]   Review of Systems Review of Systems  Constitutional: Negative for fever.  Gastrointestinal: Positive for abdominal pain and diarrhea. Negative for blood in stool and vomiting.  Genitourinary: Negative for dysuria, frequency, hematuria, urgency and vaginal bleeding.  All other systems reviewed and are negative.    Physical Exam Updated Vital Signs BP (!) 142/98 (BP Location: Left Arm)   Pulse (!) 125   Temp 98 F (36.7 C) (Oral)   Resp 18   Wt 231 lb (104.8 kg)   SpO2 100%   BMI 39.65 kg/m   Physical Exam  Constitutional: She appears well-developed and well-nourished. No distress.  HENT:  Head: Normocephalic and atraumatic.  Eyes: Conjunctivae are normal.  Neck: Neck supple.  Cardiovascular: Normal rate, regular rhythm, normal heart sounds and intact distal pulses.   HR 90bpm  Pulmonary/Chest: Effort normal and breath sounds normal. No respiratory distress.  Abdominal: Soft. There is no tenderness. There is no guarding.  Musculoskeletal: She exhibits no edema.  Lymphadenopathy:    She has no cervical adenopathy.    Neurological: She is alert.  Skin: Skin is warm and dry. She is not diaphoretic.  Psychiatric: She has a normal mood and affect. Her behavior is normal.  Nursing note and vitals reviewed.    ED Treatments / Results  DIAGNOSTIC STUDIES:  Oxygen Saturation is 100% on RA, normal by my interpretation.    COORDINATION OF CARE:  8:37 PM Discussed treatment plan with pt at bedside and pt agreed to plan.  Labs (all labs ordered are listed, but only abnormal results are displayed) Labs Reviewed - No data to display  EKG  EKG Interpretation None       Radiology No results found.  Procedures Procedures (including critical  care time)  Medications Ordered in ED Medications  dicyclomine (BENTYL) capsule 20 mg (20 mg Oral Given 09/05/16 2044)     Initial Impression / Assessment and Plan / ED Course  I have reviewed the triage vital signs and the nursing notes.  Pertinent labs & imaging results that were available during my care of the patient were reviewed by me and considered in my medical decision making (see chart for details).     Patient presents with abdominal discomfort and diarrhea following an increase in dosing of her gabapentin. I suspect that this increase in dosing may be the source of the patient's symptoms. The initially documented tachycardia is noted. However, patient was assessed by myself multiple times and at no time was she tachycardic during these assessments. Symptoms resolved with Bentyl. Patient advised to revert back to her old regimen after symptoms aside. PCP follow-up. The patient was given instructions for further home care as well as return precautions. Patient voices understanding of these instructions, accepts the plan, and is comfortable with discharge.  Final Clinical Impressions(s) / ED Diagnoses   Final diagnoses:  Adverse effect of drug, initial encounter    New Prescriptions Discharge Medication List as of 09/05/2016 10:07 PM    START  taking these medications   Details  dicyclomine (BENTYL) 20 MG tablet Take 1 tablet (20 mg total) by mouth 2 (two) times daily., Starting Wed 09/05/2016, Print       I personally performed the services described in this documentation, which was scribed in my presence. The recorded information has been reviewed and is accurate.    Anselm Pancoast, PA-C 09/06/16 1610    Tilden Fossa, MD 09/13/16 [phone]

## 2016-09-09 ENCOUNTER — Encounter (HOSPITAL_COMMUNITY): Payer: Self-pay | Admitting: Emergency Medicine

## 2016-09-09 ENCOUNTER — Ambulatory Visit (HOSPITAL_COMMUNITY)
Admission: EM | Admit: 2016-09-09 | Discharge: 2016-09-09 | Disposition: A | Discharge status: Home / Self Care | Payer: Self-pay | Attending: Family Medicine | Admitting: Family Medicine

## 2016-09-09 DIAGNOSIS — M722 Plantar fascial fibromatosis: Secondary | ICD-10-CM

## 2016-09-09 NOTE — ED Provider Notes (Signed)
CSN: [id number]     Arrival date & time 09/09/16  1833 History   First MD Initiated Contact with Patient 09/09/16 1942     Chief Complaint  Patient presents with  . Foot Pain   (Consider location/radiation/quality/duration/timing/severity/associated sxs/prior Treatment) 36 year old female presents to clinic for evaluation of right foot pain. She has a chronic history of foot pain, and is seen by a podiatrist, she states she has then "tumor" in her foot that the podiatrist is supposed to shave, however the pain is been worsening. Her next appointment with podiatry is on April 19. Her pain has been ongoing for a year, tonight the pain is centered at the base of her heal.   The history is provided by the patient.  Foot Pain  This is a chronic problem. Episode onset: 1 year. The problem occurs constantly. The problem has not changed since onset.Pertinent negatives include no chest pain. Nothing aggravates the symptoms. Relieved by: pressure and compression. The treatment provided mild relief.    Past Medical History:  Diagnosis Date  . Asthma   . Diabetes mellitus without complication (HCC)   . GERD (gastroesophageal reflux disease)   . Hypertension    Past Surgical History:  Procedure Laterality Date  . CESAREAN SECTION    . TUBAL LIGATION    . WISDOM TOOTH EXTRACTION     Family History  Problem Relation Age of Onset  . Asthma Mother   . Asthma Father    Social History  Substance Use Topics  . Smoking status: Former Smoker    Packs/day: 0.00    Types: Cigarettes    Quit date: 11/02/2013  . Smokeless tobacco: Never Used  . Alcohol use No   OB History    Gravida Para Term Preterm AB Living   SAB TAB Ectopic Multiple Live Births   1             Review of Systems  HENT: Negative.   Cardiovascular: Negative.  Negative for chest pain, palpitations and leg swelling.  Gastrointestinal: Negative.   Musculoskeletal: Negative for back pain, neck pain and neck  stiffness.  Skin: Negative.   Neurological: Negative for dizziness and weakness.  All other systems reviewed and are negative.   Allergies  Morphine and related; Metformin and related; Glyburide; Oxycodone; and Vicodin [hydrocodone-acetaminophen]  Home Medications   Prior to Admission medications   Medication Sig Start Date End Date Taking? Authorizing Provider  ACCU-CHEK AVIVA PLUS test strip  07/20/16   Historical Provider, MD  ACCU-CHEK SOFTCLIX LANCETS lancets  07/20/16   Historical Provider, MD  acetaminophen (TYLENOL) 500 MG tablet Take 1 tablet (500 mg total) by mouth every 6 (six) hours as needed. 09/03/16   Everlene Farrier, PA-C  dicyclomine (BENTYL) 20 MG tablet Take 1 tablet (20 mg total) by mouth 2 (two) times daily. 09/05/16   Shawn C Joy, PA-C  diphenoxylate-atropine (LOMOTIL) 2.5-0.025 MG tablet Take 1 tablet by mouth 4 (four) times daily as needed for diarrhea or loose stools. Take one tablet up to 3 times a day for diarrhea. 08/22/16   Hayden Rasmussen, NP  FLOVENT [facility] 110 MCG/ACT inhaler  07/31/16   Historical Provider, MD  gabapentin (NEURONTIN) 300 MG capsule Take 1 capsule (300 mg total) by mouth at bedtime. 08/31/16   Max T Hyatt, DPM  gabapentin (NEURONTIN) 600 MG tablet Take 1 tablet (600 mg total) by mouth 2 (two) times daily. 08/31/16   Max  T Hyatt, DPM  glimepiride (AMARYL) 2 MG tablet  07/27/16   Historical Provider, MD  glipiZIDE (GLUCOTROL) 5 MG tablet Take 5 mg by mouth 1 day or 1 dose. 08/06/16   Historical Provider, MD  insulin aspart (NOVOLOG) 100 UNIT/ML FlexPen Inject 10 Units into the skin daily. Take with largest meal. 08/16/16   Anders Simmonds, PA-C  Insulin Glargine (LANTUS SOLOSTAR) 100 UNIT/ML Solostar Pen Inject 48 Units into the skin daily at 10 pm. 08/16/16   Anders Simmonds, PA-C  lisinopril (PRINIVIL,ZESTRIL) 10 MG tablet  07/27/16   Historical Provider, MD  meloxicam (MOBIC) 7.5 MG tablet TK 1 T PO BID 08/14/16   Historical Provider, MD  montelukast (SINGULAIR)  10 MG tablet  07/31/16   Historical Provider, MD  naproxen (NAPROSYN) 500 MG tablet Take 1 tablet (500 mg total) by mouth 2 (two) times daily. 08/29/16   Elpidio Anis, PA-C  omeprazole (PRILOSEC) 40 MG capsule Take 1 capsule (40 mg total) by mouth daily. 08/16/16   Anders Simmonds, PA-C  ondansetron (ZOFRAN) 4 MG tablet Take 1 tablet (4 mg total) by mouth every 6 (six) hours. 08/20/16   Hayden Rasmussen, NP  PROVENTIL HFA 108 [phone] Base) MCG/ACT inhaler  07/20/16   Historical Provider, MD  TRUEPLUS PEN NEEDLES 32G X 4 MM MISC  07/27/16   Historical Provider, MD   Meds Ordered and Administered this Visit  Medications - No data to display  BP 139/90   Pulse (!) 113   Temp 98.6 F (37 C) (Oral)   Resp 16   SpO2 98%  No data found.   Physical Exam  Constitutional: She is oriented to person, place, and time. She appears well-developed and well-nourished. No distress.  HENT:  Head: Normocephalic and atraumatic.  Right Ear: External ear normal.  Left Ear: External ear normal.  Musculoskeletal:       Feet:  Neurological: She is alert and oriented to person, place, and time.  Skin: Skin is warm and dry. Capillary refill takes less than 2 seconds. No rash noted. She is not diaphoretic. No erythema.  Psychiatric: She has a normal mood and affect. Her behavior is normal.  Nursing note and vitals reviewed.   Urgent Care Course     Procedures (including critical care time)  Labs Review Labs Reviewed - No data to display  Imaging Review No results found.     MDM   1. Plantar fasciitis of left foot     Patient declined medications, stated she wanted something to put pressure on her foot. Foot was wrapped with ace bandages, and she was encouraged to follow up with her podiatrist for further care.     Dorena Bodo, NP 09/09/16 2135

## 2016-09-09 NOTE — Discharge Instructions (Signed)
You have plantar fasciitis. You will need to follow up with your podiatrist. I have given you a work night for a few days, I recommend you contact your podiatrist and see if you can have your appointment moved up.

## 2016-09-09 NOTE — ED Triage Notes (Signed)
Patient presents to [facility] today with pain in her left heel. Patient reports that it hurts a lot to walk on her foot.

## 2016-09-11 ENCOUNTER — Ambulatory Visit: Payer: Medicaid Other | Attending: Internal Medicine | Admitting: Internal Medicine

## 2016-09-11 ENCOUNTER — Encounter: Payer: Self-pay | Admitting: Internal Medicine

## 2016-09-11 VITALS — BP 134/91 | HR 98 | Temp 98.5°F | Resp 16 | Wt 227.6 lb

## 2016-09-11 DIAGNOSIS — E1140 Type 2 diabetes mellitus with diabetic neuropathy, unspecified: Secondary | ICD-10-CM | POA: Diagnosis not present

## 2016-09-11 DIAGNOSIS — Z794 Long term (current) use of insulin: Secondary | ICD-10-CM | POA: Insufficient documentation

## 2016-09-11 DIAGNOSIS — M722 Plantar fascial fibromatosis: Secondary | ICD-10-CM | POA: Diagnosis not present

## 2016-09-11 DIAGNOSIS — I10 Essential (primary) hypertension: Secondary | ICD-10-CM | POA: Diagnosis not present

## 2016-09-11 DIAGNOSIS — B370 Candidal stomatitis: Secondary | ICD-10-CM

## 2016-09-11 DIAGNOSIS — E119 Type 2 diabetes mellitus without complications: Secondary | ICD-10-CM

## 2016-09-11 DIAGNOSIS — R300 Dysuria: Secondary | ICD-10-CM

## 2016-09-11 DIAGNOSIS — Z79899 Other long term (current) drug therapy: Secondary | ICD-10-CM | POA: Insufficient documentation

## 2016-09-11 LAB — POCT URINALYSIS DIPSTICK
Bilirubin, UA: NEGATIVE
Blood, UA: NEGATIVE
Glucose, UA: 500
KETONES UA: NEGATIVE
LEUKOCYTES UA: NEGATIVE
NITRITE UA: NEGATIVE
PH UA: 5.5 (ref 5.0–8.0)
PROTEIN UA: NEGATIVE
Spec Grav, UA: 1.01 (ref 1.030–1.035)
UROBILINOGEN UA: 0.2 (ref ?–2.0)

## 2016-09-11 LAB — GLUCOSE, POCT (MANUAL RESULT ENTRY)
POC GLUCOSE: 366 mg/dL — AB (ref 70–99)
POC GLUCOSE: 297 mg/dL — AB (ref 70–99)

## 2016-09-11 MED ORDER — LISINOPRIL 10 MG PO TABS: 10.0000 mg | ORAL_TABLET | Freq: Every day | ORAL | 3 refills | Status: DC

## 2016-09-11 MED ORDER — MELOXICAM 7.5 MG PO TABS: ORAL_TABLET | ORAL | 0 refills | Status: DC

## 2016-09-11 MED ORDER — INSULIN GLARGINE 100 UNIT/ML SOLOSTAR PEN: 30.0000 [IU] | PEN_INJECTOR | Freq: Two times a day (BID) | SUBCUTANEOUS | 3 refills | Status: DC

## 2016-09-11 MED ORDER — INSULIN ASPART 100 UNIT/ML ~~LOC~~ SOLN: 20.0000 [IU] | Freq: Once | SUBCUTANEOUS | Status: DC

## 2016-09-11 MED ORDER — FLUCONAZOLE 150 MG PO TABS: 150.0000 mg | ORAL_TABLET | Freq: Every day | ORAL | 0 refills | Status: DC

## 2016-09-11 NOTE — Patient Instructions (Addendum)
Sarah Garrison - 2 wks - dm check  ** Please bring in your blood sugar logs /machine. To apponitment. **  -  Plantar Fasciitis Plantar fasciitis is a painful foot condition that affects the heel. It occurs when the band of tissue that connects the toes to the heel bone (plantar fascia) becomes irritated. This can happen after exercising too much or doing other repetitive activities (overuse injury). The pain from plantar fasciitis can range from mild irritation to severe pain that makes it difficult for you to walk or move. The pain is usually worse in the morning or after you have been sitting or lying down for a while. What are the causes? This condition may be caused by:  Standing for long periods of time.  Wearing shoes that do not fit.  Doing high-impact activities, including running, aerobics, and ballet.  Being overweight.  Having an abnormal way of walking (gait).  Having tight calf muscles.  Having high arches in your feet.  Starting a new athletic activity. What are the signs or symptoms? The main symptom of this condition is heel pain. Other symptoms include:  Pain that gets worse after activity or exercise.  Pain that is worse in the morning or after resting.  Pain that goes away after you walk for a few minutes. How is this diagnosed? This condition may be diagnosed based on your signs and symptoms. Your health care provider will also do a physical exam to check for:  A tender area on the bottom of your foot.  A high arch in your foot.  Pain when you move your foot.  Difficulty moving your foot. You may also need to have imaging studies to confirm the diagnosis. These can include:  X-rays.  Ultrasound.  MRI. How is this treated? Treatment for plantar fasciitis depends on the severity of the condition. Your treatment may include:  Rest, ice, and over-the-counter pain medicines to manage your pain.  Exercises to stretch your calves and your plantar  fascia.  A splint that holds your foot in a stretched, upward position while you sleep (night splint).  Physical therapy to relieve symptoms and prevent problems in the future.  Cortisone injections to relieve severe pain.  Extracorporeal shock wave therapy (ESWT) to stimulate damaged plantar fascia with electrical impulses. It is often used as a last resort before surgery.  Surgery, if other treatments have not worked after 12 months. Follow these instructions at home:  Take medicines only as directed by your health care provider.  Avoid activities that cause pain.  Roll the bottom of your foot over a bag of ice or a bottle of cold water. Do this for 20 minutes, 3-4 times a day.  Perform simple stretches as directed by your health care provider.  Try wearing athletic shoes with air-sole or gel-sole cushions or soft shoe inserts.  Wear a night splint while sleeping, if directed by your health care provider.  Keep all follow-up appointments with your health care provider. How is this prevented?  Do not perform exercises or activities that cause heel pain.  Consider finding low-impact activities if you continue to have problems.  Lose weight if you need to. The best way to prevent plantar fasciitis is to avoid the activities that aggravate your plantar fascia. Contact a health care provider if:  Your symptoms do not go away after treatment with home care measures.  Your pain gets worse.  Your pain affects your ability to move or do your daily activities. This  information is not intended to replace advice given to you by your health care provider. Make sure you discuss any questions you have with your health care provider. Document Released: 02/13/2001 Document Revised: 10/24/2015 Document Reviewed: 03/31/2014 Elsevier Interactive Patient Education  2017 [employer]  -   Oral Thrush, Adult Oral thrush is an infection in your mouth and throat. It causes white patches  on your tongue and in your mouth. Follow these instructions at home: Helping with soreness   To lessen your pain:  Drink cold liquids, like water and iced tea.  Eat frozen ice pops or frozen juices.  Eat foods that are easy to swallow, like gelatin and ice cream.  Drink from a straw if the patches in your mouth are painful. General instructions    Take or use over-the-counter and prescription medicines only as told by your doctor. Medicine for oral thrush may be something to swallow, or it may be something to put on the infected area.  Eat plain yogurt that has live cultures in it. Read the label to make sure.  If you wear dentures:  Take out your dentures before you go to bed.  Brush them well.  Soak them in a denture cleaner.  Rinse your mouth with warm salt-water many times a day. To make the salt-water mixture, completely dissolve 1/2-1 teaspoon of salt in 1 cup of warm water. Contact a doctor if:  Your problems are getting worse.  Your problems do not get better in less than 7 days with treatment.  Your infection is spreading. This may show as white patches on the skin outside of your mouth.  You are nursing your baby and you have redness and pain in the nipples. This information is not intended to replace advice given to you by your health care provider. Make sure you discuss any questions you have with your health care provider. Document Released: 08/15/2009 Document Revised: 02/13/2016 Document Reviewed: 02/13/2016 Elsevier Interactive Patient Education  2017 [employer].

## 2016-09-11 NOTE — Progress Notes (Signed)
Sarah Garrison, is a 36 y.o. female  ZOX:[id number]  [id number]  DOB - 1980/08/15  Chief Complaint  Patient presents with  . Diabetes        Subjective:   Cina Klumpp is a 36 y.o. female here today for a follow up visit, last seen in clinic 08/16/16. Of note, she has been to ED  2x since than due to left leg /feet pains and 1 time due to abd pains due to increase in neurontin.  She notes she takes novolg 5 units w/ meals, and taking her lantus at night. She states she is watching what she eats a lot more now, bake chicken/salad. Watches salt, and tries to avoid carbs. But does admit to drinking juice occasionally.  She c/o of noting white "spots" sometimes when she wipes lately, denies dysuria/burning. She also notes signif pain in her mouth/and wakes up w/ white plaques on her tongue.  Patient has No headache, No chest pain, No abdominal pain - No Nausea, No new weakness tingling or numbness, No Cough - SOB.  No problems updated.  ALLERGIES: Allergies  Allergen Reactions  . Morphine And Related Shortness Of Breath  . Metformin And Related Diarrhea  . Glyburide Diarrhea and Other (See Comments)    Reaction:  Nose bleeds   . Oxycodone Other (See Comments)    Pt states that this medication makes her dream about rabbits chasing her.    . Vicodin [Hydrocodone-Acetaminophen] Other (See Comments)    Pt states that this medication makes her dream about rabbits chasing her.      PAST MEDICAL HISTORY: Past Medical History:  Diagnosis Date  . Asthma   . Diabetes mellitus without complication (HCC)   . GERD (gastroesophageal reflux disease)   . Hypertension     MEDICATIONS AT HOME: Prior to Admission medications   Medication Sig Start Date End Date Taking? Authorizing Provider  ACCU-CHEK AVIVA PLUS test strip  07/20/16   Historical Provider, MD  ACCU-CHEK SOFTCLIX LANCETS lancets  07/20/16   Historical Provider, MD  acetaminophen (TYLENOL) 500 MG tablet Take 1 tablet  (500 mg total) by mouth every 6 (six) hours as needed. 09/03/16   Everlene Farrier, PA-C  dicyclomine (BENTYL) 20 MG tablet Take 1 tablet (20 mg total) by mouth 2 (two) times daily. 09/05/16   Shawn C Joy, PA-C  diphenoxylate-atropine (LOMOTIL) 2.5-0.025 MG tablet Take 1 tablet by mouth 4 (four) times daily as needed for diarrhea or loose stools. Take one tablet up to 3 times a day for diarrhea. 08/22/16   Hayden Rasmussen, NP  FLOVENT [facility] Low 110 MCG/ACT inhaler  07/31/16   Historical Provider, MD  fluconazole (DIFLUCAN) 150 MG tablet Take 1 tablet (150 mg total) by mouth daily. 09/11/16   Pete Glatter, MD  gabapentin (NEURONTIN) 300 MG capsule Take 1 capsule (300 mg total) by mouth at bedtime. 08/31/16   Max T Hyatt, DPM  gabapentin (NEURONTIN) 600 MG tablet Take 1 tablet (600 mg total) by mouth 2 (two) times daily. 08/31/16   Max T Hyatt, DPM  glimepiride (AMARYL) 2 MG tablet  07/27/16   Historical Provider, MD  glipiZIDE (GLUCOTROL) 5 MG tablet Take 5 mg by mouth 1 day or 1 dose. 08/06/16   Historical Provider, MD  insulin aspart (NOVOLOG) 100 UNIT/ML FlexPen Inject 10 Units into the skin daily. Take with largest meal. 08/16/16   Anders Simmonds, PA-C  Insulin Glargine (LANTUS SOLOSTAR) 100 UNIT/ML Solostar Pen Inject 30 Units into the skin 2 (  two) times daily. 09/11/16   Pete Glatter, MD  lisinopril (PRINIVIL,ZESTRIL) 10 MG tablet Take 1 tablet (10 mg total) by mouth daily. 09/11/16   Pete Glatter, MD  meloxicam (MOBIC) 7.5 MG tablet Take 1 T PO BID 09/11/16   Pete Glatter, MD  montelukast (SINGULAIR) 10 MG tablet  07/31/16   Historical Provider, MD  naproxen (NAPROSYN) 500 MG tablet Take 1 tablet (500 mg total) by mouth 2 (two) times daily. 08/29/16   Elpidio Anis, PA-C  omeprazole (PRILOSEC) 40 MG capsule Take 1 capsule (40 mg total) by mouth daily. 08/16/16   Anders Simmonds, PA-C  ondansetron (ZOFRAN) 4 MG tablet Take 1 tablet (4 mg total) by mouth every 6 (six) hours. 08/20/16   Hayden Rasmussen, NP    PROVENTIL HFA 108 [phone] Base) MCG/ACT inhaler  07/20/16   Historical Provider, MD  TRUEPLUS PEN NEEDLES 32G X 4 MM MISC  07/27/16   Historical Provider, MD     Objective:   Vitals:   09/11/16 1515  BP: (!) 134/91  Pulse: 98  Resp: 16  Temp: 98.5 F (36.9 C)  TempSrc: Oral  SpO2: 95%  Weight: 227 lb 9.6 oz (103.2 kg)   Weight 227 - 08/16/16 Weight 255 - 02/01/16  Exam General appearance : Awake, alert, not in any distress. Speech Clear. Not toxic looking, pleasant. Obese,  HEENT: Atraumatic and Normocephalic, pupils equally reactive to light., no white plaquing noted in o/p, but some erythema noted in o/p Neck: supple, no JVD.   Chest:Good air entry bilaterally, no added sounds. CVS: S1 S2 regular, no murmurs/gallups or rubs. Abdomen: Bowel sounds active, obese, Non tender and not distended with no gaurding, rigidity or rebound. Extremities: B/L Lower Ext shows no edema, both legs are warm to touch ttp to plantar surface of left foot to achilles tendon region on mild palpation. Neurology: Awake alert, and oriented X 3, CN II-XII grossly intact, Non focal Skin:No Rash  Data Review Lab Results  Component Value Date   HGBA1C 12.6 08/16/2016   HGBA1C 9.0 01/10/2016   HGBA1C 10.1 08/24/2015    Depression screen PHQ 2/9 09/11/2016 03/29/2016 02/01/2016 01/10/2016 10/13/2015  Decreased Interest 0 0 1 2 0  Down, Depressed, Hopeless 0 0 0 0 0  PHQ - 2 Score 0 0 1 2 0  Altered sleeping - - - 1 2  Tired, decreased energy - - - 2 2  Change in appetite - - - 0 2  Feeling bad or failure about yourself  - - - 0 -  Trouble concentrating - - - 0 0  Moving slowly or fidgety/restless - - - 0 0  Suicidal thoughts - - - 0 0  PHQ-9 Score - - - 5 6      Assessment & Plan   1. Type 2 diabetes mellitus with diabetic neuropathy, unspecified long term insulin use status (HCC) - lenghty discussion about consequences of longterm uncontrolled dm., including yeast infections, further neuropathy  damage, etc. - POCT glucose (manual entry) - POCT urinalysis dipstick - insulin aspart (novoLOG) injection 20 Units; Inject 0.2 mLs (20 Units total) into the skin once. - Uric Acid - Microalbumin/Creatinine Ratio, Urine - change:  Insulin Glargine (LANTUS SOLOSTAR) 100 UNIT/ML Solostar Pen; Inject 30 Units into the skin 2 (two) times daily.  Dispense: 6 pen; Refill: 3 - POCT glucose (manual entry) - novolog 10 units w/ meals encouraged. [facility] pharm clinic 2 wks for dm check, instructed to bring  in cbg readings/glucometer.   2.left foot pain - Plantar fasciitis of left foot, w/ neuropathy - foot wrap provided, encouraged to stay off feet as able - continue neurontin  - trial mobic bid as well. - better control of dm encouraged to help w/ this.  3. Oral candidiasis Diflucan  po qd x 3 - given signif hyperglycemia/obesity - holding off on nystatin powder due to cost.  4. Dysuria , suspect yeast infection from ooc dm. - neg ua. - Urine cytology ancillary only  5. htn - elevated today - initially I wanted to chg lisinopril 10 to prinzide 10-12.5, but pt having problems w/ finances now., so will keep same lisinopril 10 - low salt diet encouraged.    Patient have been counseled extensively about nutrition and exercise  Return in about 4 weeks (around 10/09/2016) for dm /htn fu.  The patient was given clear instructions to go to ER or return to medical center if symptoms don't improve, worsen or new problems develop. The patient verbalized understanding. The patient was told to call to get lab results if they haven't heard anything in the next week.   This note has been created with Education officer, environmental. Any transcriptional errors are unintentional.   Pete Glatter, MD, MBA/MHA [facility] [place], [place] [phone]   09/11/2016, 4:13 PM

## 2016-09-12 LAB — MICROALBUMIN / CREATININE URINE RATIO
CREATININE, UR: 48.9 mg/dL
Microalb/Creat Ratio: <6.1 mg/g creat (ref 0.0–30.0)
Microalbumin, Urine: <3 ug/mL

## 2016-09-12 LAB — URIC ACID: Uric Acid: 2.5 mg/dL (ref 2.5–7.1)

## 2016-09-13 LAB — URINE CYTOLOGY ANCILLARY ONLY
CHLAMYDIA, DNA PROBE: NEGATIVE
Neisseria Gonorrhea: NEGATIVE
Trichomonas: NEGATIVE

## 2016-09-17 ENCOUNTER — Other Ambulatory Visit: Payer: Self-pay | Admitting: Internal Medicine

## 2016-09-17 LAB — URINE CYTOLOGY ANCILLARY ONLY
Bacterial vaginitis: NEGATIVE
Candida vaginitis: POSITIVE — AB

## 2016-09-17 MED ORDER — NYSTATIN 100000 UNIT/GM EX POWD: Freq: Four times a day (QID) | CUTANEOUS | 0 refills | Status: DC

## 2016-09-20 ENCOUNTER — Encounter (HOSPITAL_COMMUNITY): Payer: Self-pay | Admitting: Emergency Medicine

## 2016-09-20 ENCOUNTER — Ambulatory Visit: Payer: Medicaid Other | Admitting: Podiatry

## 2016-09-20 ENCOUNTER — Ambulatory Visit (HOSPITAL_COMMUNITY)
Admission: EM | Admit: 2016-09-20 | Discharge: 2016-09-20 | Disposition: A | Discharge status: Home / Self Care | Payer: Medicaid Other | Attending: Family Medicine | Admitting: Family Medicine

## 2016-09-20 DIAGNOSIS — B370 Candidal stomatitis: Secondary | ICD-10-CM

## 2016-09-20 DIAGNOSIS — M722 Plantar fascial fibromatosis: Secondary | ICD-10-CM

## 2016-09-20 MED ORDER — DICLOFENAC SODIUM 75 MG PO TBEC: 75.0000 mg | DELAYED_RELEASE_TABLET | Freq: Two times a day (BID) | ORAL | 1 refills | Status: DC

## 2016-09-20 MED ORDER — FLUCONAZOLE 150 MG PO TABS: 150.0000 mg | ORAL_TABLET | Freq: Once | ORAL | 0 refills | Status: AC

## 2016-09-20 MED ORDER — TRAMADOL HCL 50 MG PO TABS: 50.0000 mg | ORAL_TABLET | Freq: Four times a day (QID) | ORAL | 0 refills | Status: DC | PRN

## 2016-09-20 NOTE — ED Triage Notes (Signed)
PT is insulin dependent diabetic. PT reports bottom of left foot feels like it's burning. PT also feels that under left heel is red, warm, and swollen. This has been going on for 1 week. PT had PCP appt today, but they cancelled it.

## 2016-09-20 NOTE — ED Provider Notes (Signed)
[facility]    CSN: [id number] Arrival date & time: 09/20/16  1700     History   Chief Complaint Chief Complaint  Patient presents with  . Foot Pain    HPI Sarah Garrison is a 36 y.o. female.   PT is insulin dependent diabetic. PT reports bottom of left foot feels like it's burning. PT also feels that under left heel is red, warm, and swollen. This has been going on for 1 week. PT had PCP appt today, but they cancelled it.  Patient is been an insulin-dependent diabetic for 2 years. She is not well-controlled with blood sugars often running in the 500s.      Past Medical History:  Diagnosis Date  . Asthma   . Diabetes mellitus without complication (HCC)   . GERD (gastroesophageal reflux disease)   . Hypertension     Patient Active Problem List   Diagnosis Date Noted  . Asthma 07/30/2016  . Uses depot medroxyprogesterone acetate as primary birth control method 05/07/2016  . Encounter for pregnancy test 02/01/2016  . Leg swelling 09/29/2015  . Morbid obesity (HCC) 09/29/2015  . DM neuropathy, type II diabetes mellitus (HCC) 08/24/2015    Past Surgical History:  Procedure Laterality Date  . CESAREAN SECTION    . TUBAL LIGATION    . WISDOM TOOTH EXTRACTION      OB History    Gravida Para Term Preterm AB Living   SAB TAB Ectopic Multiple Live Births   1               Home Medications    Prior to Admission medications   Medication Sig Start Date End Date Taking? Authorizing Provider  ACCU-CHEK AVIVA PLUS test strip  07/20/16   Historical Provider, MD  ACCU-CHEK SOFTCLIX LANCETS lancets  07/20/16   Historical Provider, MD  acetaminophen (TYLENOL) 500 MG tablet Take 1 tablet (500 mg total) by mouth every 6 (six) hours as needed. 09/03/16   Everlene Farrier, PA-C  diclofenac (VOLTAREN) 75 MG EC tablet Take 1 tablet (75 mg total) by mouth 2 (two) times daily. 09/20/16   Elvina Sidle, MD  dicyclomine (BENTYL) 20 MG tablet Take 1 tablet  (20 mg total) by mouth 2 (two) times daily. 09/05/16   Anselm Pancoast, PA-C  FLOVENT HFA 110 MCG/ACT inhaler  07/31/16   Historical Provider, MD  fluconazole (DIFLUCAN) 150 MG tablet Take 1 tablet (150 mg total) by mouth daily. 09/11/16   Pete Glatter, MD  gabapentin (NEURONTIN) 300 MG capsule Take 1 capsule (300 mg total) by mouth at bedtime. 08/31/16   Max T Hyatt, DPM  gabapentin (NEURONTIN) 600 MG tablet Take 1 tablet (600 mg total) by mouth 2 (two) times daily. 08/31/16   Max T Hyatt, DPM  glimepiride (AMARYL) 2 MG tablet  07/27/16   Historical Provider, MD  glipiZIDE (GLUCOTROL) 5 MG tablet Take 5 mg by mouth 1 day or 1 dose. 08/06/16   Historical Provider, MD  insulin aspart (NOVOLOG) 100 UNIT/ML FlexPen Inject 10 Units into the skin daily. Take with largest meal. 08/16/16   Anders Simmonds, PA-C  Insulin Glargine (LANTUS SOLOSTAR) 100 UNIT/ML Solostar Pen Inject 30 Units into the skin 2 (two) times daily. 09/11/16   Pete Glatter, MD  lisinopril (PRINIVIL,ZESTRIL) 10 MG tablet Take 1 tablet (10 mg total) by mouth daily. 09/11/16   Pete Glatter, MD  meloxicam (MOBIC) 7.5 MG  tablet Take 1 T PO BID 09/11/16   Pete Glatter, MD  montelukast (SINGULAIR) 10 MG tablet  07/31/16   Historical Provider, MD  nystatin (MYCOSTATIN/NYSTOP) powder Apply topically 4 (four) times daily. 09/17/16   Pete Glatter, MD  omeprazole (PRILOSEC) 40 MG capsule Take 1 capsule (40 mg total) by mouth daily. 08/16/16   Anders Simmonds, PA-C  ondansetron (ZOFRAN) 4 MG tablet Take 1 tablet (4 mg total) by mouth every 6 (six) hours. 08/20/16   Hayden Rasmussen, NP  PROVENTIL HFA 108 [phone] Base) MCG/ACT inhaler  07/20/16   Historical Provider, MD  traMADol (ULTRAM) 50 MG tablet Take 1 tablet (50 mg total) by mouth every 6 (six) hours as needed. 09/20/16   Elvina Sidle, MD  TRUEPLUS PEN NEEDLES 32G X 4 MM MISC  07/27/16   Historical Provider, MD    Family History Family History  Problem Relation Age of Onset  . Asthma Mother     . Asthma Father     Social History Social History  Substance Use Topics  . Smoking status: Current Every Day Smoker    Packs/day: 0.10    Types: Cigarettes    Last attempt to quit: 11/02/2013  . Smokeless tobacco: Never Used  . Alcohol use No     Allergies   Morphine and related; Metformin and related; Glyburide; Oxycodone; and Vicodin [hydrocodone-acetaminophen]   Review of Systems Review of Systems  Musculoskeletal: Positive for gait problem.  All other systems reviewed and are negative.    Physical Exam Triage Vital Signs ED Triage Vitals  Enc Vitals Group     BP --      Pulse --      Resp --      Temp --      Temp src --      SpO2 --      Weight 09/20/16 1720 222 lb (100.7 kg)     Height 09/20/16 1720  (1.651 m)     Head Circumference --      Peak Flow --      Pain Score 09/20/16 1721 6     Pain Loc --      Pain Edu? --      Excl. in GC? --    No data found.   Updated Vital Signs Ht  (1.651 m)   Wt 222 lb (100.7 kg)   BMI 36.94 kg/m    Physical Exam  Constitutional: She is oriented to person, place, and time. She appears well-developed and well-nourished.  HENT:  Right Ear: External ear normal.  Left Ear: External ear normal.  Mouth/Throat: Oropharynx is clear and moist.  Eyes: Conjunctivae and EOM are normal. Pupils are equal, round, and reactive to light.  Neck: Normal range of motion. Neck supple.  Pulmonary/Chest: Effort normal.  Musculoskeletal: Normal range of motion. She exhibits tenderness. She exhibits no edema or deformity.  Tender left heel  Neurological: She is alert and oriented to person, place, and time.  Skin: Skin is warm and dry.  Nursing note and vitals reviewed.    UC Treatments / Results  Labs (all labs ordered are listed, but only abnormal results are displayed) Labs Reviewed - No data to display  EKG  EKG Interpretation None       Radiology No results found.  Procedures Procedures (including  critical care time)  Medications Ordered in UC Medications - No data to display   Initial Impression / Assessment and Plan / UC  Course  I have reviewed the triage vital signs and the nursing notes.  Pertinent labs & imaging results that were available during my care of the patient were reviewed by me and considered in my medical decision making (see chart for details).     Final Clinical Impressions(s) / UC Diagnoses   Final diagnoses:  Plantar fasciitis of left foot    New Prescriptions New Prescriptions   DICLOFENAC (VOLTAREN) 75 MG EC TABLET    Take 1 tablet (75 mg total) by mouth 2 (two) times daily.   TRAMADOL (ULTRAM) 50 MG TABLET    Take 1 tablet (50 mg total) by mouth every 6 (six) hours as needed.     Elvina Sidle, MD 09/20/16 1736

## 2016-09-21 ENCOUNTER — Telehealth: Payer: Self-pay

## 2016-09-21 MED FILL — FLUCONAZOLE 150 MG TABLET: 150 | 2 days supply | Qty: 2 | Fill #0

## 2016-09-21 MED FILL — DICLOFENAC SOD DR 75 MG TAB: 75 | 7 days supply | Qty: 14 | Fill #0

## 2016-09-21 NOTE — Telephone Encounter (Signed)
Contacted pt to go over lab results pt is aware of results and doesn't have any questions or concerns 

## 2016-09-23 ENCOUNTER — Encounter (HOSPITAL_COMMUNITY): Payer: Self-pay | Admitting: *Deleted

## 2016-09-23 DIAGNOSIS — H9201 Otalgia, right ear: Secondary | ICD-10-CM | POA: Insufficient documentation

## 2016-09-23 DIAGNOSIS — J45909 Unspecified asthma, uncomplicated: Secondary | ICD-10-CM | POA: Insufficient documentation

## 2016-09-23 DIAGNOSIS — R51 Headache: Secondary | ICD-10-CM | POA: Insufficient documentation

## 2016-09-23 DIAGNOSIS — Z79899 Other long term (current) drug therapy: Secondary | ICD-10-CM | POA: Insufficient documentation

## 2016-09-23 DIAGNOSIS — Z794 Long term (current) use of insulin: Secondary | ICD-10-CM | POA: Insufficient documentation

## 2016-09-23 DIAGNOSIS — I10 Essential (primary) hypertension: Secondary | ICD-10-CM | POA: Insufficient documentation

## 2016-09-23 DIAGNOSIS — R110 Nausea: Secondary | ICD-10-CM | POA: Insufficient documentation

## 2016-09-23 DIAGNOSIS — Z7984 Long term (current) use of oral hypoglycemic drugs: Secondary | ICD-10-CM | POA: Insufficient documentation

## 2016-09-23 DIAGNOSIS — F17210 Nicotine dependence, cigarettes, uncomplicated: Secondary | ICD-10-CM | POA: Insufficient documentation

## 2016-09-23 DIAGNOSIS — E1165 Type 2 diabetes mellitus with hyperglycemia: Secondary | ICD-10-CM | POA: Insufficient documentation

## 2016-09-23 LAB — COMPREHENSIVE METABOLIC PANEL
ALK PHOS: 125 U/L (ref 38–126)
ALT: 15 U/L (ref 14–54)
AST: 13 U/L — ABNORMAL LOW (ref 15–41)
Albumin: 3.8 g/dL (ref 3.5–5.0)
Anion gap: 8 (ref 5–15)
BUN: 9 mg/dL (ref 6–20)
CHLORIDE: 102 mmol/L (ref 101–111)
CO2: 23 mmol/L (ref 22–32)
CREATININE: 0.86 mg/dL (ref 0.44–1.00)
Calcium: 9 mg/dL (ref 8.9–10.3)
GFR calc non Af Amer: >60 mL/min (ref >60)
Glucose, Bld: 396 mg/dL — ABNORMAL HIGH (ref 65–99)
Potassium: 3.5 mmol/L (ref 3.5–5.1)
SODIUM: 133 mmol/L — AB (ref 135–145)
Total Bilirubin: 0.5 mg/dL (ref 0.3–1.2)
Total Protein: 7 g/dL (ref 6.5–8.1)

## 2016-09-23 LAB — CBC
HEMATOCRIT: 42.8 % (ref 36.0–46.0)
Hemoglobin: 14.7 g/dL (ref 12.0–15.0)
MCH: 30.1 pg (ref 26.0–34.0)
MCHC: 34.3 g/dL (ref 30.0–36.0)
MCV: 87.7 fL (ref 78.0–100.0)
PLATELETS: 313 10*3/uL (ref 150–400)
RBC: 4.88 MIL/uL (ref 3.87–5.11)
RDW: 12.4 % (ref 11.5–15.5)
WBC: 7.5 10*3/uL (ref 4.0–10.5)

## 2016-09-23 LAB — URINALYSIS, ROUTINE W REFLEX MICROSCOPIC
Bacteria, UA: NONE SEEN
Bilirubin Urine: NEGATIVE
Hgb urine dipstick: NEGATIVE
KETONES UR: NEGATIVE mg/dL
LEUKOCYTES UA: NEGATIVE
Nitrite: NEGATIVE
PROTEIN: NEGATIVE mg/dL
Specific Gravity, Urine: 1.026 (ref 1.005–1.030)
pH: 6 (ref 5.0–8.0)

## 2016-09-23 LAB — CBG MONITORING, ED: GLUCOSE-CAPILLARY: 369 mg/dL — AB (ref 65–99)

## 2016-09-23 LAB — LIPASE, BLOOD: Lipase: 23 U/L (ref 11–51)

## 2016-09-23 NOTE — ED Triage Notes (Signed)
PT reports that she has been feeling weak, vomiting, has a headache, ear pain and abdominal cramping since yesterday. She feels like her sugar is high because she did not take her insulin today because she is not feeling well.

## 2016-09-24 ENCOUNTER — Emergency Department (HOSPITAL_COMMUNITY)
Admission: EM | Admit: 2016-09-24 | Discharge: 2016-09-24 | Disposition: A | Discharge status: Home / Self Care | Payer: Medicaid Other | Attending: Emergency Medicine | Admitting: Emergency Medicine

## 2016-09-24 DIAGNOSIS — R519 Headache, unspecified: Secondary | ICD-10-CM

## 2016-09-24 DIAGNOSIS — R110 Nausea: Secondary | ICD-10-CM

## 2016-09-24 DIAGNOSIS — R51 Headache: Secondary | ICD-10-CM

## 2016-09-24 DIAGNOSIS — R739 Hyperglycemia, unspecified: Secondary | ICD-10-CM

## 2016-09-24 DIAGNOSIS — H9201 Otalgia, right ear: Secondary | ICD-10-CM

## 2016-09-24 LAB — CBG MONITORING, ED
GLUCOSE-CAPILLARY: 318 mg/dL — AB (ref 65–99)
Glucose-Capillary: 328 mg/dL — ABNORMAL HIGH (ref 65–99)
Glucose-Capillary: 334 mg/dL — ABNORMAL HIGH (ref 65–99)

## 2016-09-24 LAB — PREGNANCY, URINE: Preg Test, Ur: NEGATIVE

## 2016-09-24 MED ORDER — ONDANSETRON HCL 4 MG/2ML IJ SOLN: 4.0000 mg | Freq: Once | INTRAMUSCULAR | Status: DC

## 2016-09-24 MED ORDER — ONDANSETRON 4 MG PO TBDP
4.0000 mg | ORAL_TABLET | Freq: Once | ORAL | Status: AC
  Administered 2016-09-24: 4 mg via ORAL
  Filled 2016-09-24: qty 1

## 2016-09-24 MED ORDER — ONDANSETRON 4 MG PO TBDP: 4.0000 mg | ORAL_TABLET | Freq: Three times a day (TID) | ORAL | 0 refills | Status: DC | PRN

## 2016-09-24 MED ORDER — IBUPROFEN 800 MG PO TABS
800.0000 mg | ORAL_TABLET | Freq: Once | ORAL | Status: AC
  Administered 2016-09-24: 800 mg via ORAL
  Filled 2016-09-24: qty 1

## 2016-09-24 MED ORDER — SODIUM CHLORIDE 0.9 % IV BOLUS (SEPSIS): 1000.0000 mL | Freq: Once | INTRAVENOUS | Status: DC

## 2016-09-24 NOTE — ED Notes (Signed)
Pt stated to tech that she thinks she may have a fever and is exhausted because she has not slept in two days.

## 2016-09-24 NOTE — ED Notes (Signed)
Pt having dry heaves. <60 cc clear emesis noted in bag. Zofran given. She states now that she feels weak, hot, chest pain, throat hurts, abdomen is cramping, and has a headache.

## 2016-09-24 NOTE — ED Notes (Signed)
Patient wanting to take her scheduled home dose of 30 u of insulin. Pt given sandwich, crackers and water given to eat with taking her insulin. Offered pt nausea med, states she is not nauseated. Only c/o headache.

## 2016-09-24 NOTE — ED Provider Notes (Signed)
TIME SEEN: 6:23 AM  CHIEF COMPLAINT: Multiple complaints  HPI: Patient is a 36 year old female with history of asthma, hypertension, insulin-dependent diabetes, GERD who presents emergency department with multiple complaints. Patient complains of a diffuse throbbing headache that occurs whenever her blood sugar goes up and down. She states that her blood sugars normally run in the 500s and that being in the 300s is "good for me". States she has not been taking her NovoLog because she's been feeling nauseated and not eating as much as normal. No abdominal pain. No chest pain. No shortness of breath. No cough. No fever. Also complaining of right ear pain for the past several days. No hearing loss or drainage.  ROS: See HPI Constitutional: no fever  Eyes: no drainage  ENT: no runny nose   Cardiovascular:  no chest pain  Resp: no SOB  GI: no vomiting GU: no dysuria Integumentary: no rash  Allergy: no hives  Musculoskeletal: no leg swelling  Neurological: no slurred speech ROS otherwise negative  PAST MEDICAL HISTORY/PAST SURGICAL HISTORY:  Past Medical History:  Diagnosis Date  . Asthma   . Diabetes mellitus without complication (HCC)   . GERD (gastroesophageal reflux disease)   . Hypertension     MEDICATIONS:  Prior to Admission medications   Medication Sig Start Date End Date Taking? Authorizing Provider  ACCU-CHEK AVIVA PLUS test strip  07/20/16   Historical Provider, MD  ACCU-CHEK SOFTCLIX LANCETS lancets  07/20/16   Historical Provider, MD  acetaminophen (TYLENOL) 500 MG tablet Take 1 tablet (500 mg total) by mouth every 6 (six) hours as needed. 09/03/16   Everlene Farrier, PA-C  diclofenac (VOLTAREN) 75 MG EC tablet Take 1 tablet (75 mg total) by mouth 2 (two) times daily. 09/20/16   Elvina Sidle, MD  dicyclomine (BENTYL) 20 MG tablet Take 1 tablet (20 mg total) by mouth 2 (two) times daily. 09/05/16   Concepcion Living  FLOVENT HFA 110 MCG/ACT inhaler  07/31/16   Historical Provider,  MD  gabapentin (NEURONTIN) 300 MG capsule Take 1 capsule (300 mg total) by mouth at bedtime. 08/31/16   Max T Hyatt, DPM  gabapentin (NEURONTIN) 600 MG tablet Take 1 tablet (600 mg total) by mouth 2 (two) times daily. 08/31/16   Max T Hyatt, DPM  glimepiride (AMARYL) 2 MG tablet  07/27/16   Historical Provider, MD  glipiZIDE (GLUCOTROL) 5 MG tablet Take 5 mg by mouth 1 day or 1 dose. 08/06/16   Historical Provider, MD  insulin aspart (NOVOLOG) 100 UNIT/ML FlexPen Inject 10 Units into the skin daily. Take with largest meal. 08/16/16   Anders Simmonds, PA-C  Insulin Glargine (LANTUS SOLOSTAR) 100 UNIT/ML Solostar Pen Inject 30 Units into the skin 2 (two) times daily. 09/11/16   Pete Glatter, MD  lisinopril (PRINIVIL,ZESTRIL) 10 MG tablet Take 1 tablet (10 mg total) by mouth daily. 09/11/16   Pete Glatter, MD  meloxicam (MOBIC) 7.5 MG tablet Take 1 T PO BID 09/11/16   Pete Glatter, MD  montelukast (SINGULAIR) 10 MG tablet  07/31/16   Historical Provider, MD  nystatin (MYCOSTATIN/NYSTOP) powder Apply topically 4 (four) times daily. 09/17/16   Pete Glatter, MD  omeprazole (PRILOSEC) 40 MG capsule Take 1 capsule (40 mg total) by mouth daily. 08/16/16   Anders Simmonds, PA-C  ondansetron (ZOFRAN) 4 MG tablet Take 1 tablet (4 mg total) by mouth every 6 (six) hours. 08/20/16   Hayden Rasmussen, NP  PROVENTIL HFA 108 [phone] Base)  MCG/ACT inhaler  07/20/16   Historical Provider, MD  traMADol (ULTRAM) 50 MG tablet Take 1 tablet (50 mg total) by mouth every 6 (six) hours as needed. 09/20/16   Elvina Sidle, MD  TRUEPLUS PEN NEEDLES 32G X 4 MM MISC  07/27/16   Historical Provider, MD    ALLERGIES:  Allergies  Allergen Reactions  . Morphine And Related Shortness Of Breath  . Metformin And Related Diarrhea  . Glyburide Diarrhea and Other (See Comments)    Reaction:  Nose bleeds   . Oxycodone Other (See Comments)    Pt states that this medication makes her dream about rabbits chasing her.    . Vicodin  [Hydrocodone-Acetaminophen] Other (See Comments)    Pt states that this medication makes her dream about rabbits chasing her.      SOCIAL HISTORY:  Social History  Substance Use Topics  . Smoking status: Current Every Day Smoker    Packs/day: 0.10    Types: Cigarettes    Last attempt to quit: 11/02/2013  . Smokeless tobacco: Never Used  . Alcohol use No    FAMILY HISTORY: Family History  Problem Relation Age of Onset  . Asthma Mother   . Asthma Father     EXAM: BP 113/82   Pulse (!) 111   Temp 98.4 F (36.9 C) (Oral)   Resp 19   Wt 214 lb 4.8 oz (97.2 kg)   SpO2 98%   BMI 35.66 kg/m  CONSTITUTIONAL: Alert and oriented and responds appropriately to questions. Well-appearing; well-nourished, Afebrile, nontoxic, appears well-hydrated, in no distress HEAD: Normocephalic EYES: Conjunctivae clear, pupils appear equal, EOMI ENT: normal nose; moist mucous membranes; No pharyngeal erythema or petechiae, no tonsillar hypertrophy or exudate, no uvular deviation, no unilateral swelling, no trismus or drooling, no muffled voice, normal phonation, no stridor, no dental caries present, no drainable dental abscess noted, no Ludwig's angina, tongue sits flat in the bottom of the mouth, no angioedema, no facial erythema or warmth, no facial swelling; no pain with movement of the neck.  TMs are clear bilaterally without erythema, purulence, bulging, perforation, effusion.  No cerumen impaction or sign of foreign body in the external auditory canal. No inflammation, erythema or drainage from the external auditory canal. No signs of mastoiditis. No pain with manipulation of the pinna bilaterally. NECK: Supple, no meningismus, no nuchal rigidity, no LAD  CARD: Regular and minimally tachycardic; S1 and S2 appreciated; no murmurs, no clicks, no rubs, no gallops RESP: Normal chest excursion without splinting or tachypnea; breath sounds clear and equal bilaterally; no wheezes, no rhonchi, no rales, no  hypoxia or respiratory distress, speaking full sentences ABD/GI: Normal bowel sounds; non-distended; soft, non-tender, no rebound, no guarding, no peritoneal signs, no hepatosplenomegaly BACK:  The back appears normal and is non-tender to palpation, there is no CVA tenderness EXT: Normal ROM in all joints; non-tender to palpation; no edema; normal capillary refill; no cyanosis, no calf tenderness or swelling    SKIN: Normal color for age and race; warm; no rash NEURO: Moves all extremities equally, normal speech, normal gait, no facial droop, sensation to light touch intact diffusely, cranial nerves II through XII intact PSYCH: The patient's mood and manner are appropriate. Grooming and personal hygiene are appropriate.  MEDICAL DECISION MAKING: Patient here with multiple different mild complaints. I do not suspect there is any life-threatening illness present. Labs obtained earlier showed no signs of DKA. She refuses any IV medications at this time including IV fluids for insulin. She  agrees to Zofran, ibuprofen for symptomatic control. I do not feel that she has meningitis, intracranial hemorrhage, stroke. No sign of otitis media, otitis externa, mastoiditis on exam. No sign of the space neck infection, pharyngitis, dental infection. Abdominal exam is benign. She has not had any vomiting. She appears well-hydrated. She has a PCP for follow-up. I suspect that she is hyperglycemic because she has not been taking her NovoLog which I have instructed her to do. Discussed return precautions. She is comfortable with this plan.  At this time, I do not feel there is any life-threatening condition present. I have reviewed and discussed all results (EKG, imaging, lab, urine as appropriate) and exam findings with patient/family. I have reviewed nursing notes and appropriate previous records.  I feel the patient is safe to be discharged home without further emergent workup and can continue workup as an outpatient as  needed. Discussed usual and customary return precautions. Patient/family verbalize understanding and are comfortable with this plan.  Outpatient follow-up has been provided if needed. All questions have been answered.      EKG Interpretation  Date/Time:  Monday September 24 2016 01:37:09 EDT Ventricular Rate:  96 PR Interval:    QRS Duration: 79 QT Interval:  338 QTC Calculation: 428 R Axis:   110 Text Interpretation:  Sinus rhythm Borderline right axis deviation Low voltage, precordial leads No significant change since last tracing Confirmed by Aravind Chrismer,  DO, Odena Mcquaid [phone]) on 09/24/2016 1:44:41 AM          Layla Maw Alyviah Crandle, DO 09/24/16 [id number]

## 2016-09-24 NOTE — Discharge Instructions (Signed)
You may alternate Tylenol 1000 mg every 6 hours as needed for pain and Ibuprofen 800 mg every 8 hours as needed for pain.  Please take Ibuprofen with food. ° °

## 2016-09-27 ENCOUNTER — Encounter (HOSPITAL_COMMUNITY): Payer: Self-pay | Admitting: Emergency Medicine

## 2016-09-27 ENCOUNTER — Ambulatory Visit (HOSPITAL_COMMUNITY)
Admission: EM | Admit: 2016-09-27 | Discharge: 2016-09-27 | Disposition: A | Discharge status: Home / Self Care | Payer: Self-pay | Attending: Internal Medicine | Admitting: Internal Medicine

## 2016-09-27 ENCOUNTER — Ambulatory Visit (INDEPENDENT_AMBULATORY_CARE_PROVIDER_SITE_OTHER): Payer: Self-pay

## 2016-09-27 DIAGNOSIS — S93401A Sprain of unspecified ligament of right ankle, initial encounter: Secondary | ICD-10-CM

## 2016-09-27 MED ORDER — DICLOFENAC SODIUM 75 MG PO TBEC
75.0000 mg | DELAYED_RELEASE_TABLET | Freq: Two times a day (BID) | ORAL | 0 refills | Status: DC
Start: 2016-09-27 — End: 2016-12-12

## 2016-09-27 NOTE — ED Triage Notes (Signed)
Patient stumped right toes on concrete.  Patient was trying to rush to get away form a dog.  No dog bite.  Able to move toes.  Toe next to little to with abrasion, pain and swelling

## 2016-10-02 ENCOUNTER — Encounter: Payer: Self-pay | Admitting: Internal Medicine

## 2016-10-02 ENCOUNTER — Ambulatory Visit: Payer: Self-pay | Attending: Internal Medicine | Admitting: Internal Medicine

## 2016-10-02 VITALS — BP 134/92 | HR 119 | Temp 99.2°F | Resp 16 | Wt 224.6 lb

## 2016-10-02 DIAGNOSIS — K219 Gastro-esophageal reflux disease without esophagitis: Secondary | ICD-10-CM | POA: Insufficient documentation

## 2016-10-02 DIAGNOSIS — J45909 Unspecified asthma, uncomplicated: Secondary | ICD-10-CM | POA: Insufficient documentation

## 2016-10-02 DIAGNOSIS — Z79891 Long term (current) use of opiate analgesic: Secondary | ICD-10-CM | POA: Insufficient documentation

## 2016-10-02 DIAGNOSIS — Z794 Long term (current) use of insulin: Secondary | ICD-10-CM

## 2016-10-02 DIAGNOSIS — E118 Type 2 diabetes mellitus with unspecified complications: Secondary | ICD-10-CM

## 2016-10-02 DIAGNOSIS — E1165 Type 2 diabetes mellitus with hyperglycemia: Secondary | ICD-10-CM

## 2016-10-02 DIAGNOSIS — Z888 Allergy status to other drugs, medicaments and biological substances status: Secondary | ICD-10-CM | POA: Insufficient documentation

## 2016-10-02 DIAGNOSIS — I10 Essential (primary) hypertension: Secondary | ICD-10-CM | POA: Insufficient documentation

## 2016-10-02 DIAGNOSIS — E119 Type 2 diabetes mellitus without complications: Secondary | ICD-10-CM

## 2016-10-02 DIAGNOSIS — IMO0002 Reserved for concepts with insufficient information to code with codable children: Secondary | ICD-10-CM

## 2016-10-02 DIAGNOSIS — E1110 Type 2 diabetes mellitus with ketoacidosis without coma: Secondary | ICD-10-CM

## 2016-10-02 DIAGNOSIS — Z79899 Other long term (current) drug therapy: Secondary | ICD-10-CM | POA: Insufficient documentation

## 2016-10-02 LAB — GLUCOSE, POCT (MANUAL RESULT ENTRY)
POC GLUCOSE: 301 mg/dL — AB (ref 70–99)
POC Glucose: 369 mg/dl — AB (ref 70–99)

## 2016-10-02 LAB — POCT URINALYSIS DIPSTICK
Bilirubin, UA: NEGATIVE
Blood, UA: NEGATIVE
Glucose, UA: 500
Leukocytes, UA: NEGATIVE
NITRITE UA: NEGATIVE
Protein, UA: NEGATIVE
Spec Grav, UA: 1.01 (ref 1.010–1.025)
UROBILINOGEN UA: 0.2 U/dL
pH, UA: 5 (ref 5.0–8.0)

## 2016-10-02 MED ORDER — INSULIN ASPART 100 UNIT/ML ~~LOC~~ SOLN
20.0000 [IU] | Freq: Once | SUBCUTANEOUS | Status: AC
  Administered 2016-10-02: 20 [IU] via SUBCUTANEOUS

## 2016-10-02 MED ORDER — INSULIN DETEMIR 100 UNIT/ML FLEXPEN: 30.0000 [IU] | PEN_INJECTOR | Freq: Two times a day (BID) | SUBCUTANEOUS | 2 refills | Status: DC

## 2016-10-02 MED ORDER — SODIUM CHLORIDE 0.9 % IV BOLUS (SEPSIS): 1000.0000 mL | Freq: Once | INTRAVENOUS | Status: DC

## 2016-10-02 MED ORDER — INSULIN ASPART 100 UNIT/ML FLEXPEN: 15.0000 [IU] | PEN_INJECTOR | Freq: Every day | SUBCUTANEOUS | 11 refills | Status: DC

## 2016-10-02 NOTE — Telephone Encounter (Signed)
Patient concern

## 2016-10-02 NOTE — Patient Instructions (Signed)
- [facility] 2 wks for dm chk  Hydrate - drink more water!!  Aim for 30 minutes of exercise most days. Rethink what you drink. Water is great! Aim for 2-3 Carb Choices per meal (25-30 grams) +/- 1 either way.  Aim for 0-15 Carbs per snack if hungry.  Include protein in moderation with your meals and snacks.  Consider reading food labels for Total Carbohydrate and Fat Grams of foods  Consider checking blood glucose (accuchecks/BG) at alternate times per day.  Continue taking medication as directed. Be mindful about how much sugar you are adding to beverages and other foods. Fruit Punch - find one with no sugar  Measure and decrease portions of carbohydrate foods. Make your plate and don't go back for seconds.  -  Diabetes Mellitus and Food It is important for you to manage your blood sugar (glucose) level. Your blood glucose level can be greatly affected by what you eat. Eating healthier foods in the appropriate amounts throughout the day at about the same time each day will help you control your blood glucose level. It can also help slow or prevent worsening of your diabetes mellitus. Healthy eating may even help you improve the level of your blood pressure and reach or maintain a healthy weight. General recommendations for healthful eating and cooking habits include:  Eating meals and snacks regularly. Avoid going long periods of time without eating to lose weight.  Eating a diet that consists mainly of plant-based foods, such as fruits, vegetables, nuts, legumes, and whole grains.  Using low-heat cooking methods, such as baking, instead of high-heat cooking methods, such as deep frying. Work with your dietitian to make sure you understand how to use the Nutrition Facts information on food labels. How can food affect me? Carbohydrates  Carbohydrates affect your blood glucose level more than any other type of food. Your dietitian will help you determine how many  carbohydrates to eat at each meal and teach you how to count carbohydrates. Counting carbohydrates is important to keep your blood glucose at a healthy level, especially if you are using insulin or taking certain medicines for diabetes mellitus. Alcohol  Alcohol can cause sudden decreases in blood glucose (hypoglycemia), especially if you use insulin or take certain medicines for diabetes mellitus. Hypoglycemia can be a life-threatening condition. Symptoms of hypoglycemia (sleepiness, dizziness, and disorientation) are similar to symptoms of having too much alcohol. If your health care provider has given you approval to drink alcohol, do so in moderation and use the following guidelines:  Women should not have more than one drink per day, and men should not have more than two drinks per day. One drink is equal to:  12 oz of beer.  5 oz of wine.  1 oz of hard liquor.  Do not drink on an empty stomach.  Keep yourself hydrated. Have water, diet soda, or unsweetened iced tea.  Regular soda, juice, and other mixers might contain a lot of carbohydrates and should be counted. What foods are not recommended? As you make food choices, it is important to remember that all foods are not the same. Some foods have fewer nutrients per serving than other foods, even though they might have the same number of calories or carbohydrates. It is difficult to get your body what it needs when you eat foods with fewer nutrients. Examples of foods that you should avoid that are high in calories and carbohydrates but low in nutrients include:  Trans fats (most  processed foods list trans fats on the Nutrition Facts label).  Regular soda.  Juice.  Candy.  Sweets, such as cake, pie, doughnuts, and cookies.  Fried foods. What foods can I eat? Eat nutrient-rich foods, which will nourish your body and keep you healthy. The food you should eat also will depend on several factors, including:  The calories you  need.  The medicines you take.  Your weight.  Your blood glucose level.  Your blood pressure level.  Your cholesterol level. You should eat a variety of foods, including:  Protein.  Lean cuts of meat.  Proteins low in saturated fats, such as fish, egg whites, and beans. Avoid processed meats.  Fruits and vegetables.  Fruits and vegetables that may help control blood glucose levels, such as apples, mangoes, and yams.  Dairy products.  Choose fat-free or low-fat dairy products, such as milk, yogurt, and cheese.  Grains, bread, pasta, and rice.  Choose whole grain products, such as multigrain bread, whole oats, and brown rice. These foods may help control blood pressure.  Fats.  Foods containing healthful fats, such as nuts, avocado, olive oil, canola oil, and fish. Does everyone with diabetes mellitus have the same meal plan? Because every person with diabetes mellitus is different, there is not one meal plan that works for everyone. It is very important that you meet with a dietitian who will help you create a meal plan that is just right for you. This information is not intended to replace advice given to you by your health care provider. Make sure you discuss any questions you have with your health care provider. Document Released: 02/15/2005 Document Revised: 10/27/2015 Document Reviewed: 04/17/2013 Elsevier Interactive Patient Education  2017 [employer].

## 2016-10-02 NOTE — Progress Notes (Signed)
Sarah Garrison, is a 36 y.o. female  UJW:[id number]  FAO:[id number]  DOB - 19-Sep-1980  Chief Complaint  Patient presents with  . Follow-up        Subjective:   Sarah Garrison is a 36 y.o. female here today for a follow up visit from ED 09/24/16 for ha and hyperglycemia. Of note, pt thought she passed out at home prior to ED.  Was seen in ED thereafter and noted hyperglycemia. Pt did not want any iv treatments at time and went home.  Of note, she has been very tired lately.  Her sugars having been ranging 200-500s, when they fluctuate she gets headaches.   Of note, she feels like her "body is breaking down", very stressed out. Has been rationing her lantus and only doing 30 units a day rather than bid due to having no money. Of note, she is having a hard time at work as well, due to fatigue, and fell asleep for or so at work and coworkers had to wake her up.    She states she is eating mosting salads w/o dressing and bake chicken, she has been avoiding breads/sugars and only drinks water.   Patient has No headache, No chest pain, No abdominal pain - No Nausea, No new weakness tingling or numbness, No Cough - SOB.  No problems updated.  ALLERGIES: Allergies  Allergen Reactions  . Morphine And Related Shortness Of Breath  . Metformin And Related Diarrhea  . Glyburide Diarrhea and Other (See Comments)    Reaction:  Nose bleeds   . Oxycodone Other (See Comments)    Pt states that this medication makes her dream about rabbits chasing her.    . Vicodin [Hydrocodone-Acetaminophen] Other (See Comments)    Pt states that this medication makes her dream about rabbits chasing her.      PAST MEDICAL HISTORY: Past Medical History:  Diagnosis Date  . Asthma   . Diabetes mellitus without complication (HCC)   . GERD (gastroesophageal reflux disease)   . Hypertension     MEDICATIONS AT HOME: Prior to Admission medications   Medication Sig Start Date End Date Taking?  Authorizing Provider  ACCU-CHEK AVIVA PLUS test strip  07/20/16   Historical Provider, MD  ACCU-CHEK SOFTCLIX LANCETS lancets  07/20/16   Historical Provider, MD  acetaminophen (TYLENOL) 500 MG tablet Take 1 tablet (500 mg total) by mouth every 6 (six) hours as needed. 09/03/16   Everlene Farrier, PA-C  diclofenac (VOLTAREN) 75 MG EC tablet Take 1 tablet (75 mg total) by mouth 2 (two) times daily. Do NOT take on an empty stomach.  Drink plenty of water while taking this medication 09/27/16   Arnaldo Natal, MD  dicyclomine (BENTYL) 20 MG tablet Take 1 tablet (20 mg total) by mouth 2 (two) times daily. 09/05/16   Concepcion Living  FLOVENT HFA 110 MCG/ACT inhaler  07/31/16   Historical Provider, MD  gabapentin (NEURONTIN) 300 MG capsule Take 1 capsule (300 mg total) by mouth at bedtime. 08/31/16   Max T Hyatt, DPM  gabapentin (NEURONTIN) 600 MG tablet Take 1 tablet (600 mg total) by mouth 2 (two) times daily. 08/31/16   Max T Hyatt, DPM  glimepiride (AMARYL) 2 MG tablet  07/27/16   Historical Provider, MD  glipiZIDE (GLUCOTROL) 5 MG tablet Take 5 mg by mouth 1 day or 1 dose. 08/06/16   Historical Provider, MD  insulin aspart (NOVOLOG) 100 UNIT/ML FlexPen Inject 15 Units into the skin daily. Take  with largest meal. 10/02/16   Pete Glatter, MD  Insulin Detemir (LEVEMIR FLEXPEN) 100 UNIT/ML Pen Inject 30 Units into the skin 2 (two) times daily. 10/02/16   Pete Glatter, MD  lisinopril (PRINIVIL,ZESTRIL) 10 MG tablet Take 1 tablet (10 mg total) by mouth daily. 09/11/16   Pete Glatter, MD  meloxicam (MOBIC) 7.5 MG tablet Take 1 T PO BID 09/11/16   Pete Glatter, MD  montelukast (SINGULAIR) 10 MG tablet  07/31/16   Historical Provider, MD  nystatin (MYCOSTATIN/NYSTOP) powder Apply topically 4 (four) times daily. 09/17/16   Pete Glatter, MD  omeprazole (PRILOSEC) 40 MG capsule Take 1 capsule (40 mg total) by mouth daily. 08/16/16   Anders Simmonds, PA-C  ondansetron (ZOFRAN ODT) 4 MG disintegrating tablet  Take 1-2 tablets (4-8 mg total) by mouth every 8 (eight) hours as needed for nausea or vomiting. 09/24/16   Kristen N Ward, DO  ondansetron (ZOFRAN) 4 MG tablet Take 1 tablet (4 mg total) by mouth every 6 (six) hours. 08/20/16   Hayden Rasmussen, NP  PROVENTIL HFA 108 [phone] Base) MCG/ACT inhaler  07/20/16   Historical Provider, MD  traMADol (ULTRAM) 50 MG tablet Take 1 tablet (50 mg total) by mouth every 6 (six) hours as needed. 09/20/16   Elvina Sidle, MD  TRUEPLUS PEN NEEDLES 32G X 4 MM MISC  07/27/16   Historical Provider, MD     Objective:   Vitals:   10/02/16 1109  BP: (!) 134/92  Pulse: (!) 119  Resp: 16  Temp: 99.2 F (37.3 C)  TempSrc: Oral  SpO2: 97%  Weight: 224 lb 9.6 oz (101.9 kg)    Exam General appearance : Awake, alert, not in any distress. Speech Clear. Not toxic looking, but very tired appearing, morbid obese w/ abd obesity as well, w/ thin extremities. HEENT: Atraumatic and Normocephalic, pupils equally reactive to light.  bilat TMS clear. Neck: supple, no JVD.   Chest:Good air entry bilaterally, no added sounds. CVS: S1 S2 regular, no murmurs/gallups or rubs. Abdomen: Bowel sounds active, obese, Non tender and not distended with no gaurding, rigidity or rebound. Extremities: B/L Lower Ext shows no edema, both legs are warm to touch Neurology: Awake alert, and oriented X 3, CN II-XII grossly intact, Non focal Skin:No Rash  Data Review Lab Results  Component Value Date   HGBA1C 12.6 08/16/2016   HGBA1C 9.0 01/10/2016   HGBA1C 10.1 08/24/2015    Depression screen PHQ 2/9 10/02/2016 09/11/2016 03/29/2016 02/01/2016 01/10/2016  Decreased Interest 3 0 0 1 2  Down, Depressed, Hopeless 0 0 0 0 0  PHQ - 2 Score 3 0 0 1 2  Altered sleeping 3 - - - 1  Tired, decreased energy 3 - - - 2  Change in appetite 3 - - - 0  Feeling bad or failure about yourself  0 - - - 0  Trouble concentrating 2 - - - 0  Moving slowly or fidgety/restless 1 - - - 0  Suicidal thoughts 0 - - - 0    PHQ-9 Score 15 - - - 5      Assessment & Plan   1. Uncontrolled type 2 diabetes mellitus with complication, with long-term current use of insulin (HCC) - will changes lantus to levemir for cost savings, sent rx to pharm.,  Cma called pharm and they will work on getting it ready for pt. - POCT glucose (manual entry) - POCT urinalysis dipstick + ketones - insulin aspart (novoLOG)  injectio n 20 Units; Inject 0.2 mLs (20 Units total) into the skin once. - attempted// sodium chloride 0.9 % bolus 1,000 mL; Inject 1,000 mLs into the vein once. Joyce Gross RN helping [place] w/ iv line, appreciate assistance.   - unfortunately, after IV started, pt could not tolerate it due to "burning" and had to d/c iv line. - encouraged increase fluid intake next few days. [facility] 2 wks for dm check  2. Diabetes mellitus without complication (HCC) See #1  3. Diabetic ketoacidosis without coma associated with type 2 diabetes mellitus (HCC) See #1 - sodium chloride 0.9 % bolus 1,000 mL; Inject 1,000 mLs into the vein once.     Patient have been counseled extensively about nutrition and exercise  Return in about 4 weeks (around 10/30/2016) for ooc dm.  The patient was given clear instructions to go to ER or return to medical center if symptoms don't improve, worsen or new problems develop. The patient verbalized understanding. The patient was told to call to get lab results if they haven't heard anything in the next week.   This note has been created with Education officer, environmental. Any transcriptional errors are unintentional.   Pete Glatter, MD, MBA/MHA [facility] [place], [place] [phone]   10/02/2016, 11:37 AM

## 2016-10-05 ENCOUNTER — Emergency Department (HOSPITAL_COMMUNITY)
Admission: EM | Admit: 2016-10-05 | Discharge: 2016-10-05 | Disposition: A | Discharge status: Home / Self Care | Payer: Self-pay | Attending: Emergency Medicine | Admitting: Emergency Medicine

## 2016-10-05 ENCOUNTER — Encounter (HOSPITAL_COMMUNITY): Payer: Self-pay | Admitting: Emergency Medicine

## 2016-10-05 DIAGNOSIS — I10 Essential (primary) hypertension: Secondary | ICD-10-CM | POA: Insufficient documentation

## 2016-10-05 DIAGNOSIS — J45909 Unspecified asthma, uncomplicated: Secondary | ICD-10-CM | POA: Insufficient documentation

## 2016-10-05 DIAGNOSIS — F17210 Nicotine dependence, cigarettes, uncomplicated: Secondary | ICD-10-CM | POA: Insufficient documentation

## 2016-10-05 DIAGNOSIS — Z794 Long term (current) use of insulin: Secondary | ICD-10-CM | POA: Insufficient documentation

## 2016-10-05 DIAGNOSIS — R51 Headache: Secondary | ICD-10-CM | POA: Insufficient documentation

## 2016-10-05 DIAGNOSIS — H9201 Otalgia, right ear: Secondary | ICD-10-CM | POA: Insufficient documentation

## 2016-10-05 DIAGNOSIS — E119 Type 2 diabetes mellitus without complications: Secondary | ICD-10-CM | POA: Insufficient documentation

## 2016-10-05 DIAGNOSIS — Z79899 Other long term (current) drug therapy: Secondary | ICD-10-CM | POA: Insufficient documentation

## 2016-10-05 DIAGNOSIS — R519 Headache, unspecified: Secondary | ICD-10-CM

## 2016-10-05 LAB — I-STAT CHEM 8, ED
BUN: 15 mg/dL (ref 6–20)
CALCIUM ION: 1.19 mmol/L (ref 1.15–1.40)
CREATININE: 0.6 mg/dL (ref 0.44–1.00)
Chloride: 101 mmol/L (ref 101–111)
Glucose, Bld: 329 mg/dL — ABNORMAL HIGH (ref 65–99)
HEMATOCRIT: 42 % (ref 36.0–46.0)
HEMOGLOBIN: 14.3 g/dL (ref 12.0–15.0)
Potassium: 3.8 mmol/L (ref 3.5–5.1)
Sodium: 136 mmol/L (ref 135–145)
TCO2: 24 mmol/L (ref 0–100)

## 2016-10-05 MED ORDER — NAPROXEN 500 MG PO TABS
500.0000 mg | ORAL_TABLET | Freq: Once | ORAL | Status: AC
  Administered 2016-10-05: 500 mg via ORAL
  Filled 2016-10-05: qty 1

## 2016-10-05 MED ORDER — PSEUDOEPHEDRINE HCL 60 MG PO TABS: 60.0000 mg | ORAL_TABLET | Freq: Four times a day (QID) | ORAL | 0 refills | Status: DC | PRN

## 2016-10-05 MED ORDER — BUTALBITAL-APAP-CAFFEINE 50-325-40 MG PO TABS: 1.0000 | ORAL_TABLET | Freq: Four times a day (QID) | ORAL | 0 refills | Status: DC | PRN

## 2016-10-05 MED ORDER — AMOXICILLIN 500 MG PO CAPS: 500.0000 mg | ORAL_CAPSULE | Freq: Three times a day (TID) | ORAL | 0 refills | Status: DC

## 2016-10-05 MED ORDER — PROCHLORPERAZINE MALEATE 10 MG PO TABS
10.0000 mg | ORAL_TABLET | Freq: Once | ORAL | Status: AC
  Administered 2016-10-05: 10 mg via ORAL
  Filled 2016-10-05: qty 1

## 2016-10-05 MED ORDER — DIPHENHYDRAMINE HCL 25 MG PO CAPS
50.0000 mg | ORAL_CAPSULE | Freq: Once | ORAL | Status: AC
  Administered 2016-10-05: 50 mg via ORAL
  Filled 2016-10-05: qty 2

## 2016-10-05 NOTE — ED Triage Notes (Addendum)
Pt reports HA for the past 4 days. No emesis

## 2016-10-05 NOTE — ED Provider Notes (Signed)
[facility]-EMERGENCY DEPT Provider Note   CSN: [id number] Arrival date & time: 10/05/16  1211  By signing my name below, I, Rosario Adie, attest that this documentation has been prepared under the direction and in the presence of [facility], PA-C.  Electronically Signed: Rosario Adie, ED Scribe. 10/05/16. 1:52 PM.  History   Chief Complaint Chief Complaint  Patient presents with  . Headache   The history is provided by the patient. No language interpreter was used.    HPI Comments: Sarah Garrison is a 36 y.o. female with a PMHx of DM, asthma, HTN, and obesity, who presents to the Emergency Department complaining of persistent frontal, aching headache beginning four days ago. She reports associated right ear pain and photophobia. Pt was seen by her PCP for this issue, who attributed her HA to her elevated CBG.  Has had her ears checked several times and have been normal. Per pt, sugars have been running near 500; however, today she measured her sugar at 318. Pt took Ibuprofen and Tramadol w/o relief of her symptoms at home. She has a h/o similar headaches over the past two years and reports that she typically has them every 2-3 weeks and they typically resolved following several days. Pt is currently on Depo Provera and she is due for this again in three days. She denies fever, shortness of breath, cough, congestion, sinus pressure/pain, sore throat, vomiting, diarrhea, numbness, weakness, urgency, frequency, hematuria, dysuria, difficulty urinating, or any other associated symptoms.   Past Medical History:  Diagnosis Date  . Asthma   . Diabetes mellitus without complication (HCC)   . GERD (gastroesophageal reflux disease)   . Hypertension    Patient Active Problem List   Diagnosis Date Noted  . Asthma 07/30/2016  . Uses depot medroxyprogesterone acetate as primary birth control method 05/07/2016  . Encounter for pregnancy test 02/01/2016  . Leg swelling 09/29/2015  .  Morbid obesity (HCC) 09/29/2015  . DM neuropathy, type II diabetes mellitus (HCC) 08/24/2015   Past Surgical History:  Procedure Laterality Date  . CESAREAN SECTION    . TUBAL LIGATION    . WISDOM TOOTH EXTRACTION     OB History    Gravida Para Term Preterm AB Living   5 4     1 4    SAB TAB Ectopic Multiple Live Births   1             Home Medications    Prior to Admission medications   Medication Sig Start Date End Date Taking? Authorizing Provider  ACCU-CHEK AVIVA PLUS test strip  07/20/16   Historical Provider, MD  ACCU-CHEK SOFTCLIX LANCETS lancets  07/20/16   Historical Provider, MD  acetaminophen (TYLENOL) 500 MG tablet Take 1 tablet (500 mg total) by mouth every 6 (six) hours as needed. 09/03/16   Everlene Farrier, PA-C  amoxicillin (AMOXIL) 500 MG capsule Take 1 capsule (500 mg total) by mouth 3 (three) times daily. 10/05/16   Trixie Dredge, PA-C  butalbital-acetaminophen-caffeine [place], ESGIC) [phone] MG tablet Take 1-2 tablets by mouth every 6 (six) hours as needed for headache. 10/05/16 10/05/17  Trixie Dredge, PA-C  diclofenac (VOLTAREN) 75 MG EC tablet Take 1 tablet (75 mg total) by mouth 2 (two) times daily. Do NOT take on an empty stomach.  Drink plenty of water while taking this medication 09/27/16   Arnaldo Natal, MD  dicyclomine (BENTYL) 20 MG tablet Take 1 tablet (20 mg total) by mouth 2 (two) times daily. 09/05/16  Concepcion Living  FLOVENT [facility] 110 MCG/ACT inhaler  07/31/16   Historical Provider, MD  gabapentin (NEURONTIN) 300 MG capsule Take 1 capsule (300 mg total) by mouth at bedtime. 08/31/16   Max T Hyatt, DPM  gabapentin (NEURONTIN) 600 MG tablet Take 1 tablet (600 mg total) by mouth 2 (two) times daily. 08/31/16   Max T Hyatt, DPM  glimepiride (AMARYL) 2 MG tablet  07/27/16   Historical Provider, MD  glipiZIDE (GLUCOTROL) 5 MG tablet Take 5 mg by mouth 1 day or 1 dose. 08/06/16   Historical Provider, MD  insulin aspart (NOVOLOG) 100 UNIT/ML FlexPen Inject 15 Units into the  skin daily. Take with largest meal. 10/02/16   Pete Glatter, MD  Insulin Detemir (LEVEMIR FLEXPEN) 100 UNIT/ML Pen Inject 30 Units into the skin 2 (two) times daily. 10/02/16   Pete Glatter, MD  lisinopril (PRINIVIL,ZESTRIL) 10 MG tablet Take 1 tablet (10 mg total) by mouth daily. 09/11/16   Pete Glatter, MD  meloxicam (MOBIC) 7.5 MG tablet Take 1 T PO BID 09/11/16   Pete Glatter, MD  montelukast (SINGULAIR) 10 MG tablet  07/31/16   Historical Provider, MD  nystatin (MYCOSTATIN/NYSTOP) powder Apply topically 4 (four) times daily. 09/17/16   Pete Glatter, MD  omeprazole (PRILOSEC) 40 MG capsule Take 1 capsule (40 mg total) by mouth daily. 08/16/16   Anders Simmonds, PA-C  ondansetron (ZOFRAN ODT) 4 MG disintegrating tablet Take 1-2 tablets (4-8 mg total) by mouth every 8 (eight) hours as needed for nausea or vomiting. 09/24/16   Kristen N Ward, DO  ondansetron (ZOFRAN) 4 MG tablet Take 1 tablet (4 mg total) by mouth every 6 (six) hours. 08/20/16   Hayden Rasmussen, NP  PROVENTIL HFA 108 [phone] [employer]) MCG/ACT inhaler  07/20/16   Historical Provider, MD  pseudoephedrine (SUDAFED) 60 MG tablet Take 1 tablet (60 mg total) by mouth every 6 (six) hours as needed for congestion. 10/05/16   Trixie Dredge, PA-C  traMADol (ULTRAM) 50 MG tablet Take 1 tablet (50 mg total) by mouth every 6 (six) hours as needed. 09/20/16   Elvina Sidle, MD  TRUEPLUS PEN NEEDLES 32G X 4 MM MISC  07/27/16   Historical Provider, MD   Family History Family History  Problem Relation Age of Onset  . Asthma Mother   . Asthma Father    Social History Social History  Substance Use Topics  . Smoking status: Current Every Day Smoker    Packs/day: 0.10    Types: Cigarettes    Last attempt to quit: 11/02/2013  . Smokeless tobacco: Never Used  . Alcohol use No   Allergies   Morphine and related; Metformin and related; Glyburide; Oxycodone; and Vicodin [hydrocodone-acetaminophen]  Review of Systems Review of Systems  Constitutional:  Negative for fever.  HENT: Negative for congestion, sinus pain, sinus pressure and sore throat.   Respiratory: Negative for cough and shortness of breath.   Cardiovascular: Negative for chest pain.  Gastrointestinal: Negative for diarrhea and vomiting.  Genitourinary: Negative for difficulty urinating, dysuria, frequency, hematuria and urgency.  Neurological: Positive for headaches. Negative for weakness and numbness.   Physical Exam Updated Vital Signs BP 116/83 (BP Location: Right Arm)   Pulse 84   Temp 98.2 F (36.8 C) (Oral)   Resp 18   SpO2 100%   Physical Exam  Constitutional: She appears well-developed and well-nourished. No distress.  HENT:  Head: Normocephalic and atraumatic.  Bilateral ear canals clear.  Bilateral  TMs with normal light reflex, ?opaque fluid behind TMs.  Mastoids nontender.    Neck: Neck supple.  Cardiovascular: Normal rate, regular rhythm and normal heart sounds.   No murmur heard. Pulmonary/Chest: Effort normal and breath sounds normal. No respiratory distress. She has no wheezes. She has no rales.  Neurological: She is alert.  CN II-XII intact, EOMs intact, no pronator drift, grip strengths equal bilaterally; strength 5/5 in all extremities, sensation intact in all extremities; finger to nose, heel to shin, rapid alternating movements normal; gait is normal.  Skin: She is not diaphoretic.  Nursing note and vitals reviewed.  ED Treatments / Results  DIAGNOSTIC STUDIES: Oxygen Saturation is 97% on RA, normal by my interpretation.   COORDINATION OF CARE: 1:51 PM-Discussed next steps with pt. Pt verbalized understanding and is agreeable with the plan.   Labs (all labs ordered are listed, but only abnormal results are displayed) Labs Reviewed  I-STAT CHEM 8, ED - Abnormal; Notable for the following:       Result Value   Glucose, Bld 329 (*)    All other components within normal limits   EKG  EKG Interpretation None      Radiology No  results found.  Procedures Procedures   Medications Ordered in ED Medications  prochlorperazine (COMPAZINE) tablet 10 mg (10 mg Oral Given 10/05/16 1446)  diphenhydrAMINE (BENADRYL) capsule 50 mg (50 mg Oral Given 10/05/16 1414)  naproxen (NAPROSYN) tablet 500 mg (500 mg Oral Given 10/05/16 1414)    Initial Impression / Assessment and Plan / ED Course  I have reviewed the triage vital signs and the nursing notes.  Pertinent labs & imaging results that were available during my care of the patient were reviewed by me and considered in my medical decision making (see chart for details).  Clinical Course as of Oct 06 1802  Fri Oct 05, 2016  1408 Pt declines IV, IVF, IV medications.  [EW]  1449 Pt given her medication, tells nurse she needs to leave for family obligations.    [EW]    Clinical Course User Index [EW] Trixie DredgeEmily Ellen Mayol, PA-C    Afebrile, nontoxic patient with uncontrolled DM p/w chronic/episodic headache and right ear pain.  R ear pain x 1 week.  Right TM may have some opaque fluid behind it, but does look similar to left ear that is not painful.  Given patient's ear pain and poorly controlled diabetes, will cover for OM.  Headache treated with PO migraine cocktail in ED, pt needing to leave soon after administration.   D/C home with small number fioricet, referral to headache wellness center, amoxicillin.   Discussed result, findings, treatment, and follow up  with patient.  Pt given return precautions.  Pt verbalizes understanding and agrees with plan.       Final Clinical Impressions(s) / ED Diagnoses   Final diagnoses:  Bad headache  Right ear pain   New Prescriptions Discharge Medication List as of 10/05/2016  3:01 PM    START taking these medications   Details  amoxicillin (AMOXIL) 500 MG capsule Take 1 capsule (500 mg total) by mouth 3 (three) times daily., Starting Fri 10/05/2016, Print    butalbital-acetaminophen-caffeine (FIORICET, ESGIC) 50-325-40 MG tablet Take 1-2  tablets by mouth every 6 (six) hours as needed for headache., Starting Fri 10/05/2016, Until Sat 10/05/2017, Print    pseudoephedrine (SUDAFED) 60 MG tablet Take 1 tablet (60 mg total) by mouth every 6 (six) hours as needed for congestion., Starting Fri 10/05/2016, Print  I personally performed the services described in this documentation, which was scribed in my presence. The recorded information has been reviewed and is accurate.     Trixie Dredge, PA-C 10/05/16 1806    Arby Barrette, MD 10/07/16 [phone]

## 2016-10-05 NOTE — Discharge Instructions (Signed)
Read the information below.  You may return to the Emergency Department at any time for worsening condition or any new symptoms that concern you.  If you develop fevers, uncontrolled ear pain, bleeding or discharge from your ear, see your doctor or return for a recheck.     You are having a headache. No specific cause was found today for your headache. It may have been a migraine or other cause of headache. Stress, anxiety, fatigue, and depression are common triggers for headaches. Your headache today does not appear to be life-threatening or require hospitalization, but often the exact cause of headaches is not determined in the emergency department. Therefore, follow-up with your doctor is very important to find out what may have caused your headache, and whether or not you need any further diagnostic testing or treatment. Sometimes headaches can appear benign (not harmful), but then more serious symptoms can develop which should prompt an immediate re-evaluation by your doctor or the emergency department. SEEK MEDICAL ATTENTION IF: You develop possible problems with medications prescribed.  The medications don't resolve your headache, if it recurs , or if you have multiple episodes of vomiting or can't take fluids. You have a change from the usual headache. RETURN IMMEDIATELY IF you develop a sudden, severe headache or confusion, become poorly responsive or faint, develop a fever above 100.49F or problem breathing, have a change in speech, vision, swallowing, or understanding, or develop new weakness, numbness, tingling, incoordination, or have a seizure.

## 2016-10-12 ENCOUNTER — Ambulatory Visit: Payer: Medicaid Other | Attending: Internal Medicine | Admitting: *Deleted

## 2016-10-12 DIAGNOSIS — Z308 Encounter for other contraceptive management: Secondary | ICD-10-CM | POA: Insufficient documentation

## 2016-10-12 DIAGNOSIS — Z3049 Encounter for surveillance of other contraceptives: Secondary | ICD-10-CM

## 2016-10-12 DIAGNOSIS — Z789 Other specified health status: Secondary | ICD-10-CM | POA: Insufficient documentation

## 2016-10-12 MED ORDER — MEDROXYPROGESTERONE ACETATE 150 MG/ML IM SUSP
150.0000 mg | Freq: Once | INTRAMUSCULAR | Status: AC
  Administered 2016-10-12: 150 mg via INTRAMUSCULAR

## 2016-10-12 NOTE — Progress Notes (Signed)
   Last Depo-Provera: 2/ 16/2018 Side Effects if any: denies Serum HCG indicated: no Depo-Provera 150 mg IM given by: T.Nichoel Digiulio,RN. Next appointment due July 26 at 0930. Pt given reminder card.

## 2016-10-16 ENCOUNTER — Emergency Department (HOSPITAL_COMMUNITY)
Admission: EM | Admit: 2016-10-16 | Discharge: 2016-10-16 | Disposition: A | Discharge status: Home / Self Care | Payer: Self-pay | Attending: Emergency Medicine | Admitting: Emergency Medicine

## 2016-10-16 ENCOUNTER — Emergency Department (HOSPITAL_COMMUNITY): Payer: Self-pay

## 2016-10-16 ENCOUNTER — Encounter (HOSPITAL_COMMUNITY): Payer: Self-pay | Admitting: Emergency Medicine

## 2016-10-16 DIAGNOSIS — F17210 Nicotine dependence, cigarettes, uncomplicated: Secondary | ICD-10-CM | POA: Insufficient documentation

## 2016-10-16 DIAGNOSIS — H9201 Otalgia, right ear: Secondary | ICD-10-CM | POA: Insufficient documentation

## 2016-10-16 DIAGNOSIS — J45909 Unspecified asthma, uncomplicated: Secondary | ICD-10-CM | POA: Insufficient documentation

## 2016-10-16 DIAGNOSIS — E1140 Type 2 diabetes mellitus with diabetic neuropathy, unspecified: Secondary | ICD-10-CM | POA: Insufficient documentation

## 2016-10-16 DIAGNOSIS — Z794 Long term (current) use of insulin: Secondary | ICD-10-CM | POA: Insufficient documentation

## 2016-10-16 DIAGNOSIS — E119 Type 2 diabetes mellitus without complications: Secondary | ICD-10-CM | POA: Insufficient documentation

## 2016-10-16 LAB — CBC WITH DIFFERENTIAL/PLATELET
Basophils Absolute: 0 10*3/uL (ref 0.0–0.1)
Basophils Relative: 0 %
EOS ABS: 0.1 10*3/uL (ref 0.0–0.7)
Eosinophils Relative: 1 %
HCT: 39.1 % (ref 36.0–46.0)
HEMOGLOBIN: 13.6 g/dL (ref 12.0–15.0)
LYMPHS ABS: 2.7 10*3/uL (ref 0.7–4.0)
Lymphocytes Relative: 36 %
MCH: 30 pg (ref 26.0–34.0)
MCHC: 34.8 g/dL (ref 30.0–36.0)
MCV: 86.3 fL (ref 78.0–100.0)
Monocytes Absolute: 0.4 10*3/uL (ref 0.1–1.0)
Monocytes Relative: 5 %
NEUTROS ABS: 4.4 10*3/uL (ref 1.7–7.7)
NEUTROS PCT: 58 %
Platelets: 277 10*3/uL (ref 150–400)
RBC: 4.53 MIL/uL (ref 3.87–5.11)
RDW: 12.4 % (ref 11.5–15.5)
WBC: 7.6 10*3/uL (ref 4.0–10.5)

## 2016-10-16 LAB — I-STAT CHEM 8, ED
BUN: 11 mg/dL (ref 6–20)
CHLORIDE: 104 mmol/L (ref 101–111)
Calcium, Ion: 1.22 mmol/L (ref 1.15–1.40)
Creatinine, Ser: 0.5 mg/dL (ref 0.44–1.00)
Glucose, Bld: 290 mg/dL — ABNORMAL HIGH (ref 65–99)
HEMATOCRIT: 41 % (ref 36.0–46.0)
Hemoglobin: 13.9 g/dL (ref 12.0–15.0)
Potassium: 3.9 mmol/L (ref 3.5–5.1)
SODIUM: 139 mmol/L (ref 135–145)
TCO2: 23 mmol/L (ref 0–100)

## 2016-10-16 LAB — SEDIMENTATION RATE: SED RATE: 11 mm/h (ref 0–22)

## 2016-10-16 LAB — I-STAT BETA HCG BLOOD, ED (MC, WL, AP ONLY): I-stat hCG, quantitative: <5 m[IU]/mL (ref <5)

## 2016-10-16 MED ORDER — NAPROXEN 500 MG PO TABS
500.0000 mg | ORAL_TABLET | Freq: Once | ORAL | Status: AC
  Administered 2016-10-16: 500 mg via ORAL
  Filled 2016-10-16: qty 1

## 2016-10-16 MED ORDER — IOPAMIDOL (ISOVUE-300) INJECTION 61%
INTRAVENOUS | Status: AC
  Administered 2016-10-16: 100 mL
  Filled 2016-10-16: qty 100

## 2016-10-16 MED ORDER — CIPROFLOXACIN HCL 500 MG PO TABS: 500.0000 mg | ORAL_TABLET | Freq: Two times a day (BID) | ORAL | 0 refills | Status: DC

## 2016-10-16 MED ORDER — KETOROLAC TROMETHAMINE 15 MG/ML IJ SOLN
15.0000 mg | Freq: Once | INTRAMUSCULAR | Status: AC
  Administered 2016-10-16: 15 mg via INTRAVENOUS
  Filled 2016-10-16: qty 1

## 2016-10-16 MED ORDER — NAPROXEN 500 MG PO TABS: 500.0000 mg | ORAL_TABLET | Freq: Two times a day (BID) | ORAL | 0 refills | Status: DC

## 2016-10-16 NOTE — ED Provider Notes (Signed)
[facility]-EMERGENCY DEPT Provider Note   CSN: [id number] Arrival date & time: 10/16/16  1807     History   Chief Complaint Chief Complaint  Patient presents with  . Otalgia  . Jaw Pain    HPI Sarah Garrison is a 36 y.o. female.  HPI  SUBJECTIVE: Sarah Sherifflicia M Gauer is a 36 y.o. female with hx of diabetes comes in with  2 week(s) history of pain and pulling at right ear, and sore throat and pain while swallowing. Temperature not measured at home.    Past Medical History:  Diagnosis Date  . Asthma   . Diabetes mellitus without complication (HCC)   . GERD (gastroesophageal reflux disease)   . Hypertension     Patient Active Problem List   Diagnosis Date Noted  . Asthma 07/30/2016  . Uses depot medroxyprogesterone acetate as primary birth control method 05/07/2016  . Encounter for pregnancy test 02/01/2016  . Leg swelling 09/29/2015  . Morbid obesity (HCC) 09/29/2015  . DM neuropathy, type II diabetes mellitus (HCC) 08/24/2015    Past Surgical History:  Procedure Laterality Date  . CESAREAN SECTION    . TUBAL LIGATION    . WISDOM TOOTH EXTRACTION      OB History    Gravida Para Term Preterm AB Living   5 4     1 4    SAB TAB Ectopic Multiple Live Births   1               Home Medications    Prior to Admission medications   Medication Sig Start Date End Date Taking? Authorizing Provider  ACCU-CHEK AVIVA PLUS test strip  07/20/16  Yes [provider]  ACCU-CHEK SOFTCLIX LANCETS lancets  07/20/16  Yes [provider]  amoxicillin-clavulanate (AUGMENTIN) 875-125 MG tablet Take 1 tablet by mouth 2 (two) times daily.   Yes [provider]  glimepiride (AMARYL) 2 MG tablet  07/27/16  Yes [provider]  glipiZIDE (GLUCOTROL) 5 MG tablet Take 5 mg by mouth daily.  08/06/16  Yes [provider]  insulin aspart (NOVOLOG) 100 UNIT/ML FlexPen Inject 15 Units into the skin daily. Take with largest meal. Patient taking  differently: Inject 10 Units into the skin 3 (three) times daily with meals. Take with largest meal. 10/02/16  Yes Langeland, Dawn T, MD  LANTUS SOLOSTAR 100 UNIT/ML Solostar Pen Take 30 Units by mouth 2 (two) times daily. 07/31/16  Yes [provider]  lisinopril (PRINIVIL,ZESTRIL) 10 MG tablet Take 1 tablet (10 mg total) by mouth daily. 09/11/16  Yes Langeland, Dawn T, MD  montelukast (SINGULAIR) 10 MG tablet Take 10 mg by mouth at bedtime.  07/31/16  Yes [provider]  omeprazole (PRILOSEC) 40 MG capsule Take 1 capsule (40 mg total) by mouth daily. 08/16/16  Yes Anders SimmondsMcClung, Angela M, PA-C  PROVENTIL HFA 108 [phone](90 Base) MCG/ACT inhaler Inhale 2 puffs into the lungs every 6 (six) hours as needed for wheezing or shortness of breath.  07/20/16  Yes [provider]  traMADol (ULTRAM) 50 MG tablet Take 1 tablet (50 mg total) by mouth every 6 (six) hours as needed. 09/20/16  Yes Elvina SidleLauenstein, Kurt, MD  TRUEPLUS PEN NEEDLES 32G X 4 MM MISC  07/27/16  Yes [provider]  acetaminophen (TYLENOL) 500 MG tablet Take 1 tablet (500 mg total) by mouth every 6 (six) hours as needed. Patient not taking: Reported on 10/16/2016 09/03/16   Everlene Farrieransie, William, PA-C  amoxicillin (AMOXIL) 500 MG capsule Take  1 capsule (500 mg total) by mouth 3 (three) times daily. Patient not taking: Reported on 10/16/2016 10/05/16   Trixie Dredge, PA-C  butalbital-acetaminophen-caffeine (FIORICET, ESGIC) [phone] MG tablet Take 1-2 tablets by mouth every 6 (six) hours as needed for headache. Patient not taking: Reported on 10/16/2016 10/05/16 10/05/17  Trixie Dredge, PA-C  ciprofloxacin (CIPRO) 500 MG tablet Take 1 tablet (500 mg total) by mouth every 12 (twelve) hours. 10/16/16   Derwood Kaplan, MD  diclofenac (VOLTAREN) 75 MG EC tablet Take 1 tablet (75 mg total) by mouth 2 (two) times daily. Do NOT take on an empty stomach.  Drink plenty of water while taking this medication Patient not taking: Reported on 10/16/2016 09/27/16    Arnaldo Natal, MD  dicyclomine (BENTYL) 20 MG tablet Take 1 tablet (20 mg total) by mouth 2 (two) times daily. Patient not taking: Reported on 10/16/2016 09/05/16   Joy, Ines Bloomer C, PA-C  gabapentin (NEURONTIN) 300 MG capsule Take 1 capsule (300 mg total) by mouth at bedtime. Patient not taking: Reported on 10/16/2016 08/31/16   Hyatt, Max T, DPM  gabapentin (NEURONTIN) 600 MG tablet Take 1 tablet (600 mg total) by mouth 2 (two) times daily. Patient not taking: Reported on 10/16/2016 08/31/16   Hyatt, Max T, DPM  Insulin Detemir (LEVEMIR FLEXPEN) 100 UNIT/ML Pen Inject 30 Units into the skin 2 (two) times daily. Patient not taking: Reported on 10/16/2016 10/02/16   Dierdre Searles T, MD  meloxicam (MOBIC) 7.5 MG tablet Take 1 T PO BID Patient not taking: Reported on 10/16/2016 09/11/16   Pete Glatter, MD  naproxen (NAPROSYN) 500 MG tablet Take 1 tablet (500 mg total) by mouth 2 (two) times daily with a meal. 10/16/16   Derwood Kaplan, MD  nystatin (MYCOSTATIN/NYSTOP) powder Apply topically 4 (four) times daily. Patient not taking: Reported on 10/16/2016 09/17/16   Dierdre Searles T, MD  ondansetron (ZOFRAN ODT) 4 MG disintegrating tablet Take 1-2 tablets (4-8 mg total) by mouth every 8 (eight) hours as needed for nausea or vomiting. Patient not taking: Reported on 10/16/2016 09/24/16   Ward, Layla Maw, DO  ondansetron (ZOFRAN) 4 MG tablet Take 1 tablet (4 mg total) by mouth every 6 (six) hours. Patient not taking: Reported on 10/16/2016 08/20/16   Hayden Rasmussen, NP  pseudoephedrine (SUDAFED) 60 MG tablet Take 1 tablet (60 mg total) by mouth every 6 (six) hours as needed for congestion. Patient not taking: Reported on 10/16/2016 10/05/16   Trixie Dredge, PA-C    Family History Family History  Problem Relation Age of Onset  . Asthma Mother   . Asthma Father     Social History Social History  Substance Use Topics  . Smoking status: Current Every Day Smoker    Packs/day: 0.10    Types: Cigarettes     Last attempt to quit: 11/02/2013  . Smokeless tobacco: Never Used  . Alcohol use No     Allergies   Morphine and related; Metformin and related; Glyburide; Ivp dye [iodinated diagnostic agents]; Oxycodone; and Vicodin [hydrocodone-acetaminophen]   Review of Systems Review of Systems  Constitutional: Positive for activity change.  HENT: Positive for ear pain, sore throat and trouble swallowing. Negative for facial swelling, postnasal drip, rhinorrhea, sneezing, tinnitus and voice change.   Eyes: Negative for pain, redness and visual disturbance.  Respiratory: Negative for shortness of breath.   Cardiovascular: Negative for chest pain.  Skin: Negative for rash.  Allergic/Immunologic: Positive for immunocompromised state.     Physical Exam  Updated Vital Signs BP 128/85 (BP Location: Left Arm)   Pulse 79   Temp 98.7 F (37.1 C) (Oral)   Resp 19   Ht 5\' 4"  (1.626 m)   Wt 227 lb 7 oz (103.2 kg)   SpO2 100%   BMI 39.04 kg/m   Physical Exam  Constitutional: She is oriented to person, place, and time. She appears well-developed and well-nourished.  HENT:  Head: Normocephalic and atraumatic.  Ears: left ear normal, right TM red, dull, bulging, right external canal inflamed, hearing grossly normal bilaterally  Pt has tenderness with palpation of tragus and also over the mastoid region.  No trismus.  Nose: normal and patent, no erythema, discharge or polyps and normal nontender sinuses Oropharynx: mucous membranes moist, pharynx normal without lesions, tonsils normal, dental hygiene good, tongue normal and TMJ exam normal, no tenderness, normal excursion Neck: supple, no significant adenopathy, R sided anterior lymphadenopathy   Eyes: EOM are normal. Pupils are equal, round, and reactive to light.  Neck: Neck supple.  Cardiovascular: Normal rate, regular rhythm and normal heart sounds.   No murmur heard. Pulmonary/Chest: Effort normal. No stridor. No respiratory distress.    Abdominal: Soft. She exhibits no distension. There is no tenderness. There is no rebound and no guarding.  Lymphadenopathy:    She has cervical adenopathy.  Neurological: She is alert and oriented to person, place, and time.  Skin: Skin is warm and dry.  Nursing note and vitals reviewed.    ED Treatments / Results  Labs (all labs ordered are listed, but only abnormal results are displayed) Labs Reviewed  I-STAT CHEM 8, ED - Abnormal; Notable for the following:       Result Value   Glucose, Bld 290 (*)    All other components within normal limits  CBC WITH DIFFERENTIAL/PLATELET  SEDIMENTATION RATE  I-STAT BETA HCG BLOOD, ED ([facility], [facility], [facility] ONLY)    EKG  EKG Interpretation None       Radiology Ct Soft Tissue Neck W Contrast  Result Date: 10/16/2016 CLINICAL DATA:  Initial evaluation for acute right mastoid and right jaw pain. EXAM: CT NECK WITH CONTRAST TECHNIQUE: Multidetector CT imaging of the neck was performed using the standard protocol following the bolus administration of intravenous contrast. CONTRAST:  ISOVUE-300 IOPAMIDOL (ISOVUE-300) INJECTION 61% COMPARISON:  Prior CT from 02/24/2016. FINDINGS: Pharynx and larynx: Oral cavity within normal limits without mass lesion or loculated fluid collection. No acute abnormality about the dentition. [place] tonsils symmetric and within normal limits. Parapharyngeal fat preserved. Nasopharynx within normal limits. Retropharyngeal soft tissues normal without edema or inflammatory changes. Epiglottis within normal limits. Vallecula clear. Remainder of the hypopharynx and supraglottic larynx within normal limits. True cords symmetric and normal. Subglottic airway clear. Salivary glands: Parotid glands within normal limits without acute inflammatory changes. Few small intra parotid lymph nodes noted bilaterally. Submandibular glands normal. Thyroid: Thyroid normal. Lymph nodes: No pathologically enlarged lymph nodes identified within  the neck. Vascular: Normal intravascular enhancement seen throughout the neck. Limited intracranial: Unremarkable. Visualized orbits: Globes and orbital soft tissues within normal limits. Mastoids and visualized paranasal sinuses: Visualized paranasal sinuses are clear. Bilateral concha bullosa noted. Visualized mastoids are clear. Middle ear cavities are well pneumatized. No significant soft tissue swelling or inflammatory changes seen about the right ear. Skeleton: No acute osseus abnormality. No worrisome lytic or blastic osseous lesions. Upper chest: Visualized upper mediastinum within normal limits. Partially visualized lung apices are clear. IMPRESSION: Negative CT of the neck. No acute  inflammatory changes identified. No mass lesion or adenopathy. Partially visualized right mastoid air cells and middle ear cavity are clear. No findings to explain patient's symptoms identified. Electronically Signed   By: Rise Mu M.D.   On: 10/16/2016 20:28    Procedures Procedures (including critical care time)  Medications Ordered in ED Medications  naproxen (NAPROSYN) tablet 500 mg (not administered)  ketorolac (TORADOL) 15 MG/ML injection 15 mg (15 mg Intravenous Given 10/16/16 1931)  iopamidol (ISOVUE-300) 61 % injection (100 mLs  Contrast Given 10/16/16 1959)     Initial Impression / Assessment and Plan / ED Course  I have reviewed the triage vital signs and the nursing notes.  Pertinent labs & imaging results that were available during my care of the patient were reviewed by me and considered in my medical decision making (see chart for details).  Clinical Course as of Oct 17 2126  Tue Oct 16, 2016  2128 Results from the ER workup discussed with the patient face to face and all questions answered to the best of my ability.  I am not sure what to make of her pain. We will give her cipro and have her f/u with ENT and PCP. CT Soft Tissue Neck W Contrast [AN]  2128 Strict ER return  precautions have been discussed, and patient is agreeing with the plan and is comfortable with the workup done and the recommendations from the ER.  Results from the ER workup discussed with the patient face to face and all questions answered to the best of my ability.   [AN]    Clinical Course User Index [AN] Derwood Kaplan, MD    Pt comes in with earache x 2 weeks. She has taken augmentin w/o any relief. Pt reports worsening of the pain, no hearing loss or tinnitus. Pt has tenderness over the mastoid region and c/o throat pain as well. Gross exam reveals dull, inflamed TM on the R side, and there is mastoid tenderness. Pt has no stridor, trismus. Pt is diabetic, sugars reportedly have stayed high. WE will get CT scan to r/o any infection of deep space and r/o mastoiditis.   Final Clinical Impressions(s) / ED Diagnoses   Final diagnoses:  Otogenic otalgia of right ear    New Prescriptions New Prescriptions   CIPROFLOXACIN (CIPRO) 500 MG TABLET    Take 1 tablet (500 mg total) by mouth every 12 (twelve) hours.   NAPROXEN (NAPROSYN) 500 MG TABLET    Take 1 tablet (500 mg total) by mouth 2 (two) times daily with a meal.     Derwood Kaplan, MD 10/16/16 2128

## 2016-10-16 NOTE — Discharge Instructions (Signed)
We saw you in the ER for the earache and jaw pain, throat pain. All the results in the ER are normal, labs and imaging. We are not sure what is causing your symptoms. We are giving you cipro for possible ear infection. The workup in the ER is not complete, and is limited to screening for life threatening and emergent conditions only, so please see a primary care doctor for further evaluation.  Please return to the ER if your symptoms worsen; you have increased pain, fevers, chills, inability to keep any medications down, confusion. Otherwise see the outpatient doctor as requested.

## 2016-10-16 NOTE — ED Triage Notes (Signed)
Pt comes in with complaints of right sided ear pain that radiates to her jaw for the past 2 weeks. Seen two weeks ago for the same and exam was unremarkable.  Pt reports pain has gotten worse. Pt reports taking amoxicillin without relief.

## 2016-10-18 ENCOUNTER — Encounter: Payer: Self-pay | Admitting: Internal Medicine

## 2016-11-02 ENCOUNTER — Encounter: Payer: Self-pay | Admitting: Physician Assistant

## 2016-11-02 ENCOUNTER — Ambulatory Visit: Payer: Self-pay | Attending: Physician Assistant | Admitting: Physician Assistant

## 2016-11-02 VITALS — BP 111/77 | HR 115 | Temp 98.2°F | Resp 18 | Ht 67.0 in | Wt 224.0 lb

## 2016-11-02 DIAGNOSIS — E119 Type 2 diabetes mellitus without complications: Secondary | ICD-10-CM | POA: Insufficient documentation

## 2016-11-02 DIAGNOSIS — E1140 Type 2 diabetes mellitus with diabetic neuropathy, unspecified: Secondary | ICD-10-CM

## 2016-11-02 DIAGNOSIS — H9201 Otalgia, right ear: Secondary | ICD-10-CM | POA: Insufficient documentation

## 2016-11-02 DIAGNOSIS — Z5189 Encounter for other specified aftercare: Secondary | ICD-10-CM | POA: Insufficient documentation

## 2016-11-02 DIAGNOSIS — I10 Essential (primary) hypertension: Secondary | ICD-10-CM | POA: Insufficient documentation

## 2016-11-02 DIAGNOSIS — Z79899 Other long term (current) drug therapy: Secondary | ICD-10-CM | POA: Insufficient documentation

## 2016-11-02 DIAGNOSIS — H66011 Acute suppurative otitis media with spontaneous rupture of ear drum, right ear: Secondary | ICD-10-CM | POA: Insufficient documentation

## 2016-11-02 DIAGNOSIS — K219 Gastro-esophageal reflux disease without esophagitis: Secondary | ICD-10-CM | POA: Insufficient documentation

## 2016-11-02 DIAGNOSIS — J45909 Unspecified asthma, uncomplicated: Secondary | ICD-10-CM | POA: Insufficient documentation

## 2016-11-02 DIAGNOSIS — Z79891 Long term (current) use of opiate analgesic: Secondary | ICD-10-CM | POA: Insufficient documentation

## 2016-11-02 DIAGNOSIS — H66014 Acute suppurative otitis media with spontaneous rupture of ear drum, recurrent, right ear: Secondary | ICD-10-CM

## 2016-11-02 DIAGNOSIS — Z794 Long term (current) use of insulin: Secondary | ICD-10-CM | POA: Insufficient documentation

## 2016-11-02 LAB — GLUCOSE, POCT (MANUAL RESULT ENTRY): POC Glucose: 394 mg/dl — AB (ref 70–99)

## 2016-11-02 MED ORDER — NYSTATIN 100000 UNIT/GM EX POWD: Freq: Four times a day (QID) | CUTANEOUS | 0 refills | Status: DC

## 2016-11-02 MED ORDER — OFLOXACIN 0.3 % OT SOLN: 5.0000 [drp] | Freq: Once | OTIC | Status: DC

## 2016-11-02 NOTE — Progress Notes (Signed)
Patient is here for ear concern.  Patient complains of right ear draining blood.  Patient has taken medication in office. Patient administered 30 units of Lantus and 10 units of Novolog today in office. Patient has eaten today.

## 2016-11-02 NOTE — Progress Notes (Signed)
Sarah Garrison, is a 36 y.o. female  ZOX:[id number]  WJX:[id number]  DOB - 01/27/1981  Subjective:  Chief Complaint and HPI: Sarah Garrison is a 36 y.o. female here today for a follow up visit for continued R ear pain.  She was seen in the ED 10/05/2016 and treated with amoxicillin.  She was seen again on 10/16/2016 and prescribed Cipro. A CT scan was done to R/O mastoiditis. She is here today with continued R ear pain and "oozing and some blood" occasionally coming out of her R ear.  No f/c. She was advised to f/up with ENT at that time but hasn't.  She did not take her insulin this morning.  Blood sugar is high.  shee feels fine and is asymptomatic.  Poor compliance overall historically.    ED/Hospital notes reviewed.     ROS:   Constitutional:  No f/c, No night sweats, No unexplained weight loss. EENT:  No vision changes, No blurry vision, No hearing changes. No mouth, throat, or ear problems other than as prescribed above.  Respiratory: No cough, No SOB Cardiac: No CP, no palpitations GI:  No abd pain, No N/V/D. GU: No Urinary s/sx Musculoskeletal: No joint pain Neuro: No headache, no dizziness, no motor weakness.  Skin: No rash Endocrine:  No polydipsia. No polyuria.  Psych: Denies SI/HI  No problems updated.  ALLERGIES: Allergies  Allergen Reactions  . Morphine And Related Shortness Of Breath  . Metformin And Related Diarrhea  . Glyburide Diarrhea and Other (See Comments)    Reaction:  Nose bleeds   . Ivp Dye [Iodinated Diagnostic Agents]     Shortness of breath.    . Oxycodone Other (See Comments)    Pt states that this medication makes her "dream about rabbits chasing" her.    . Vicodin [Hydrocodone-Acetaminophen] Other (See Comments)    Pt states that this medication makes her "dream about rabbits chasing" her.      PAST MEDICAL HISTORY: Past Medical History:  Diagnosis Date  . Asthma   . Diabetes mellitus without complication (HCC)   . GERD  (gastroesophageal reflux disease)   . Hypertension     MEDICATIONS AT HOME: Prior to Admission medications   Medication Sig Start Date End Date Taking? Authorizing Provider  ACCU-CHEK AVIVA PLUS test strip  07/20/16   [provider]  ACCU-CHEK SOFTCLIX LANCETS lancets  07/20/16   [provider]  acetaminophen (TYLENOL) 500 MG tablet Take 1 tablet (500 mg total) by mouth every 6 (six) hours as needed. Patient not taking: Reported on 10/16/2016 09/03/16   Everlene Farrier, PA-C  amoxicillin (AMOXIL) 500 MG capsule Take 1 capsule (500 mg total) by mouth 3 (three) times daily. Patient not taking: Reported on 10/16/2016 10/05/16   Trixie Dredge, PA-C  amoxicillin-clavulanate (AUGMENTIN) 875-125 MG tablet Take 1 tablet by mouth 2 (two) times daily.    [provider]  butalbital-acetaminophen-caffeine (FIORICET, ESGIC) 50-325-40 MG tablet Take 1-2 tablets by mouth every 6 (six) hours as needed for headache. Patient not taking: Reported on 10/16/2016 10/05/16 10/05/17  Trixie Dredge, PA-C  ciprofloxacin (CIPRO) 500 MG tablet Take 1 tablet (500 mg total) by mouth every 12 (twelve) hours. 10/16/16   Derwood Kaplan, MD  diclofenac (VOLTAREN) 75 MG EC tablet Take 1 tablet (75 mg total) by mouth 2 (two) times daily. Do NOT take on an empty stomach.  Drink plenty of water while taking this medication Patient not taking: Reported on 10/16/2016 09/27/16   Arnaldo Natal, MD  dicyclomine (BENTYL) 20 MG tablet Take 1 tablet (20 mg total) by mouth 2 (two) times daily. Patient not taking: Reported on 10/16/2016 09/05/16   Joy, Ines BloomerShawn C, PA-C  gabapentin (NEURONTIN) 300 MG capsule Take 1 capsule (300 mg total) by mouth at bedtime. Patient not taking: Reported on 10/16/2016 08/31/16   Hyatt, Max T, DPM  gabapentin (NEURONTIN) 600 MG tablet Take 1 tablet (600 mg total) by mouth 2 (two) times daily. Patient not taking: Reported on 10/16/2016 08/31/16   Ernestene KielHyatt, Max T, DPM  glimepiride (AMARYL) 2 MG tablet   07/27/16   [provider]  glipiZIDE (GLUCOTROL) 5 MG tablet Take 5 mg by mouth daily.  08/06/16   [provider]  insulin aspart (NOVOLOG) 100 UNIT/ML FlexPen Inject 15 Units into the skin daily. Take with largest meal. Patient taking differently: Inject 10 Units into the skin 3 (three) times daily with meals. Take with largest meal. 10/02/16   Langeland, Dawn T, MD  Insulin Detemir (LEVEMIR FLEXPEN) 100 UNIT/ML Pen Inject 30 Units into the skin 2 (two) times daily. Patient not taking: Reported on 10/16/2016 10/02/16   Pete GlatterLangeland, Dawn T, MD  LANTUS SOLOSTAR 100 UNIT/ML Solostar Pen Take 30 Units by mouth 2 (two) times daily. 07/31/16   [provider]  lisinopril (PRINIVIL,ZESTRIL) 10 MG tablet Take 1 tablet (10 mg total) by mouth daily. 09/11/16   Pete GlatterLangeland, Dawn T, MD  meloxicam (MOBIC) 7.5 MG tablet Take 1 T PO BID Patient not taking: Reported on 10/16/2016 09/11/16   Dierdre SearlesLangeland, Dawn T, MD  montelukast (SINGULAIR) 10 MG tablet Take 10 mg by mouth at bedtime.  07/31/16   [provider]  naproxen (NAPROSYN) 500 MG tablet Take 1 tablet (500 mg total) by mouth 2 (two) times daily with a meal. 10/16/16   Derwood KaplanNanavati, Ankit, MD  nystatin (MYCOSTATIN/NYSTOP) powder Apply topically 4 (four) times daily. Patient not taking: Reported on 10/16/2016 09/17/16   Pete GlatterLangeland, Dawn T, MD  omeprazole (PRILOSEC) 40 MG capsule Take 1 capsule (40 mg total) by mouth daily. 08/16/16   Anders SimmondsMcClung, Kamarri Lovvorn M, PA-C  ondansetron (ZOFRAN ODT) 4 MG disintegrating tablet Take 1-2 tablets (4-8 mg total) by mouth every 8 (eight) hours as needed for nausea or vomiting. Patient not taking: Reported on 10/16/2016 09/24/16   Ward, Layla MawKristen N, DO  ondansetron (ZOFRAN) 4 MG tablet Take 1 tablet (4 mg total) by mouth every 6 (six) hours. Patient not taking: Reported on 10/16/2016 08/20/16   Hayden RasmussenMabe, David, NP  PROVENTIL HFA 108 (90 Base) MCG/ACT inhaler Inhale 2 puffs into the lungs every 6 (six) hours as needed for wheezing  or shortness of breath.  07/20/16   [provider]  pseudoephedrine (SUDAFED) 60 MG tablet Take 1 tablet (60 mg total) by mouth every 6 (six) hours as needed for congestion. Patient not taking: Reported on 10/16/2016 10/05/16   Trixie DredgeWest, Emily, PA-C  traMADol (ULTRAM) 50 MG tablet Take 1 tablet (50 mg total) by mouth every 6 (six) hours as needed. 09/20/16   Elvina SidleLauenstein, Kurt, MD  TRUEPLUS PEN NEEDLES 32G X 4 MM MISC  07/27/16   [provider]     Objective:  EXAM:   Vitals:   11/02/16 0913  BP: 111/77  Pulse: (!) 115  Resp: 18  Temp: 98.2 F (36.8 C)  TempSrc: Oral  SpO2: 98%  Weight: 224 lb (101.6 kg)  Height: 5\' 7"  (1.702 m)    General appearance : A&OX3. NAD. Non-toxic-appearing HEENT: Atraumatic and Normocephalic.  PERRLA.  EOM intact.  There is purulent drainage in the R canal.  The  Canal is about 50% swollen and occlude.  Visualization of the TM is difficult but appears to be likely ruptured.   Mouth-MMM, post pharynx WNL w/o erythema, No PND. Neck: supple, no JVD. No cervical lymphadenopathy. No thyromegaly Chest/Lungs:  Breathing-non-labored, Good air entry bilaterally, breath sounds normal without rales, rhonchi, or wheezing  CVS: S1 S2 regular, no murmurs, gallops, rubs  Extremities: Bilateral Lower Ext shows no edema, both legs are warm to touch with = pulse throughout Neurology:  CN II-XII grossly intact, Non focal.   Psych:  TP linear. J/I WNL. Normal speech. Appropriate eye contact and affect.  Skin:  No Rash  Data Review Lab Results  Component Value Date   HGBA1C 12.6 08/16/2016   HGBA1C 9.0 01/10/2016   HGBA1C 10.1 08/24/2015     Assessment & Plan   1. Type 2 diabetes mellitus with diabetic neuropathy, unspecified whether long term insulin use (HCC) Patient administered 30 units of Lantus and 10 units of Novolog today in office - Glucose (CBG) Compliance with regimen and diet stressed.   2. Recurrent acute suppurative otitis media of right  ear with spontaneous rupture of tympanic membrane She has completed a course of augmentin. - ofloxacin (FLOXIN) 0.3 % otic solution 5 drop; Place 5 drops into the right ear once. - Ambulatory referral to ENT   Patient have been counseled extensively about nutrition and exercise  Return in about 3 weeks (around 11/23/2016) for assign new PCP; recheck ear, f/up uncontrolled blood sugars.  The patient was given clear instructions to go to ER or return to medical center if symptoms don't improve, worsen or new problems develop. The patient verbalized understanding. The patient was told to call to get lab results if they haven't heard anything in the next week.     Georgian Co, PA-C [facility] [place], [place] [phone]   11/02/2016, 9:20 AMPatient ID: Angelina Sheriff, female   DOB: 1980/12/28, 36 y.o.   MRN: [id number]

## 2016-11-02 NOTE — Patient Instructions (Signed)
Cehck blood sugars fasting and at meals and at bedtime and record and bring to next visit

## 2016-11-06 ENCOUNTER — Ambulatory Visit: Payer: Medicaid Other

## 2016-11-30 ENCOUNTER — Encounter (HOSPITAL_COMMUNITY): Payer: Self-pay | Admitting: Emergency Medicine

## 2016-11-30 ENCOUNTER — Ambulatory Visit (HOSPITAL_COMMUNITY)
Admission: EM | Admit: 2016-11-30 | Discharge: 2016-11-30 | Disposition: A | Discharge status: Home / Self Care | Payer: Medicaid Other | Attending: Physician Assistant | Admitting: Physician Assistant

## 2016-11-30 DIAGNOSIS — S46911A Strain of unspecified muscle, fascia and tendon at shoulder and upper arm level, right arm, initial encounter: Secondary | ICD-10-CM

## 2016-11-30 MED ORDER — NAPROXEN 500 MG PO TABS: 500.0000 mg | ORAL_TABLET | Freq: Two times a day (BID) | ORAL | 0 refills | Status: DC

## 2016-11-30 NOTE — ED Triage Notes (Signed)
Here for right shoulder pain onset last night associated w/tingling of right arm  Denies inj/trauma  A&O x4... NAD... Ambulatory

## 2016-11-30 NOTE — ED Provider Notes (Signed)
CSN: [id number]     Arrival date & time 11/30/16  1024 History   None    Chief Complaint  Patient presents with  . Shoulder Pain   (Consider location/radiation/quality/duration/timing/severity/associated sxs/prior Treatment) 36 yo female with PMH of uncontrolled DM and HTN comes in with 1 day history of right shoulder and arm pain. Denies injury. Pain constant and exacerbated with movement. Took her last pill of diclofenac with good relief. Denies fever, chills, night sweats. Has some numbness and tingling at the shoulder and upper arm. Denies neck pain, past neck injury. Patient with DM with fasting glucose 300-400, which is an improvement for her. Denies weakness, dizziness, abdominal pain.       Past Medical History:  Diagnosis Date  . Asthma   . Diabetes mellitus without complication (HCC)   . GERD (gastroesophageal reflux disease)   . Hypertension    Past Surgical History:  Procedure Laterality Date  . CESAREAN SECTION    . TUBAL LIGATION    . WISDOM TOOTH EXTRACTION     Family History  Problem Relation Age of Onset  . Asthma Mother   . Asthma Father    Social History  Substance Use Topics  . Smoking status: Current Every Day Smoker    Packs/day: 0.10    Types: Cigarettes    Last attempt to quit: 11/02/2013  . Smokeless tobacco: Never Used  . Alcohol use No   OB History    Gravida Para Term Preterm AB Living   5 4     1 4    SAB TAB Ectopic Multiple Live Births   1             Review of Systems  Constitutional: Negative for chills, diaphoresis and fever.  Musculoskeletal: Positive for arthralgias and myalgias. Negative for joint swelling, neck pain and neck stiffness.  Skin: Negative for rash and wound.  Neurological: Positive for numbness. Negative for dizziness, tremors, syncope, weakness and light-headedness.    Allergies  Morphine and related; Metformin and related; Glyburide; Ivp dye [iodinated diagnostic agents]; Oxycodone; and Vicodin  [hydrocodone-acetaminophen]  Home Medications   Prior to Admission medications   Medication Sig Start Date End Date Taking? Authorizing Provider  gabapentin (NEURONTIN) 300 MG capsule Take 1 capsule (300 mg total) by mouth at bedtime. 08/31/16  Yes Hyatt, Max T, DPM  gabapentin (NEURONTIN) 600 MG tablet Take 1 tablet (600 mg total) by mouth 2 (two) times daily. 08/31/16  Yes Hyatt, Max T, DPM  glimepiride (AMARYL) 2 MG tablet  07/27/16  Yes [provider]  glipiZIDE (GLUCOTROL) 5 MG tablet Take 5 mg by mouth daily.  08/06/16  Yes [provider]  insulin aspart (NOVOLOG) 100 UNIT/ML FlexPen Inject 15 Units into the skin daily. Take with largest meal. Patient taking differently: Inject 10 Units into the skin 3 (three) times daily with meals. Take with largest meal. 10/02/16  Yes Langeland, Dawn T, MD  LANTUS SOLOSTAR 100 UNIT/ML Solostar Pen Take 30 Units by mouth 2 (two) times daily. 07/31/16  Yes [provider]  lisinopril (PRINIVIL,ZESTRIL) 10 MG tablet Take 1 tablet (10 mg total) by mouth daily. 09/11/16  Yes Langeland, Dawn T, MD  meloxicam (MOBIC) 7.5 MG tablet Take 1 T PO BID 09/11/16  Yes Langeland, Dawn T, MD  montelukast (SINGULAIR) 10 MG tablet Take 10 mg by mouth at bedtime.  07/31/16  Yes [provider]  omeprazole (PRILOSEC) 40 MG capsule Take 1 capsule (40 mg total) by mouth daily. 08/16/16  Yes Anders Simmonds, PA-C  ACCU-CHEK AVIVA PLUS test strip  07/20/16   [provider]  ACCU-CHEK SOFTCLIX LANCETS lancets  07/20/16   [provider]  acetaminophen (TYLENOL) 500 MG tablet Take 1 tablet (500 mg total) by mouth every 6 (six) hours as needed. Patient not taking: Reported on 10/16/2016 09/03/16   Everlene Farrier, PA-C  amoxicillin (AMOXIL) 500 MG capsule Take 1 capsule (500 mg total) by mouth 3 (three) times daily. Patient not taking: Reported on 10/16/2016 10/05/16   Trixie Dredge, PA-C  amoxicillin-clavulanate (AUGMENTIN) 875-125 MG tablet  Take 1 tablet by mouth 2 (two) times daily.    [provider]  butalbital-acetaminophen-caffeine (FIORICET, ESGIC) 50-325-40 MG tablet Take 1-2 tablets by mouth every 6 (six) hours as needed for headache. Patient not taking: Reported on 10/16/2016 10/05/16 10/05/17  Trixie Dredge, PA-C  ciprofloxacin (CIPRO) 500 MG tablet Take 1 tablet (500 mg total) by mouth every 12 (twelve) hours. 10/16/16   Derwood Kaplan, MD  diclofenac (VOLTAREN) 75 MG EC tablet Take 1 tablet (75 mg total) by mouth 2 (two) times daily. Do NOT take on an empty stomach.  Drink plenty of water while taking this medication Patient not taking: Reported on 10/16/2016 09/27/16   Arnaldo Natal, MD  dicyclomine (BENTYL) 20 MG tablet Take 1 tablet (20 mg total) by mouth 2 (two) times daily. Patient not taking: Reported on 10/16/2016 09/05/16   Joy, Hillard Danker, PA-C  Insulin Detemir (LEVEMIR FLEXPEN) 100 UNIT/ML Pen Inject 30 Units into the skin 2 (two) times daily. Patient not taking: Reported on 10/16/2016 10/02/16   Pete Glatter, MD  naproxen (NAPROSYN) 500 MG tablet Take 1 tablet (500 mg total) by mouth 2 (two) times daily with a meal. 11/30/16   Everli Rother V, PA-C  nystatin (MYCOSTATIN/NYSTOP) powder Apply topically 4 (four) times daily. 11/02/16   Anders Simmonds, PA-C  ondansetron (ZOFRAN ODT) 4 MG disintegrating tablet Take 1-2 tablets (4-8 mg total) by mouth every 8 (eight) hours as needed for nausea or vomiting. Patient not taking: Reported on 10/16/2016 09/24/16   Ward, Layla Maw, DO  ondansetron (ZOFRAN) 4 MG tablet Take 1 tablet (4 mg total) by mouth every 6 (six) hours. Patient not taking: Reported on 10/16/2016 08/20/16   Hayden Rasmussen, NP  PROVENTIL HFA 108 (90 Base) MCG/ACT inhaler Inhale 2 puffs into the lungs every 6 (six) hours as needed for wheezing or shortness of breath.  07/20/16   [provider]  pseudoephedrine (SUDAFED) 60 MG tablet Take 1 tablet (60 mg total) by mouth every 6 (six) hours as needed for  congestion. Patient not taking: Reported on 10/16/2016 10/05/16   Trixie Dredge, PA-C  traMADol (ULTRAM) 50 MG tablet Take 1 tablet (50 mg total) by mouth every 6 (six) hours as needed. 09/20/16   Elvina Sidle, MD  TRUEPLUS PEN NEEDLES 32G X 4 MM MISC  07/27/16   [provider]   Meds Ordered and Administered this Visit  Medications - No data to display  BP (!) 123/92 (BP Location: Left Arm)   Pulse 100   Temp 98.5 F (36.9 C) (Oral)   Resp 16   SpO2 98%  No data found.   Physical Exam  Constitutional: She is oriented to person, place, and time. She appears well-developed and well-nourished. No distress.  HENT:  Head: Normocephalic and atraumatic.  Eyes: Conjunctivae are normal. Pupils are equal, round, and reactive to light.  Neck: Normal range of motion. Neck supple.  No spinous process tenderness and no muscular tenderness present. Normal range of motion present.  Cardiovascular: Normal rate and regular rhythm.  Exam reveals no gallop and no friction rub.   No murmur heard. Pulmonary/Chest: Effort normal and breath sounds normal. She has no wheezes. She has no rales.  Musculoskeletal:  Tenderness on palpation of the shoulder and upper arm. No bony tenderness. Full ROM of shoulder and elbow. Strength normal and equal bilaterally. Localized tingling sensation of the shoulder and upper arm. Sensation intact and equal bilaterally  Neurological: She is alert and oriented to person, place, and time.  Skin: Skin is warm and dry.  Psychiatric: She has a normal mood and affect. Her behavior is normal. Judgment normal.    Urgent Care Course     Procedures (including critical care time)  Labs Review Labs Reviewed - No data to display  Imaging Review No results found.     MDM   1. Strain of right shoulder, initial encounter    1. Discussed with patient history and exam most consistent with muscle strain. Start Naproxen 500mg  BID x 10 days. Side effects of medicines  discussed with patient. Ice/heat compress as needed. Discussed with patient importance of blood glucose control for healing and to prevent neuropathy. Patient to follow up with PCP if symptoms worsens.    Belinda FisherYu, Tamiya Colello V, PA-C 11/30/16 1849

## 2016-12-11 ENCOUNTER — Emergency Department (HOSPITAL_COMMUNITY): Payer: Self-pay

## 2016-12-11 ENCOUNTER — Emergency Department (HOSPITAL_COMMUNITY)
Admission: EM | Admit: 2016-12-11 | Discharge: 2016-12-11 | Disposition: A | Discharge status: Home / Self Care | Payer: Self-pay | Attending: Emergency Medicine | Admitting: Emergency Medicine

## 2016-12-11 ENCOUNTER — Ambulatory Visit (HOSPITAL_COMMUNITY)
Admission: EM | Admit: 2016-12-11 | Discharge: 2016-12-11 | Disposition: A | Discharge status: Nursing Home (Includes ICF) | Payer: Medicaid Other

## 2016-12-11 ENCOUNTER — Encounter (HOSPITAL_COMMUNITY): Payer: Self-pay | Admitting: Emergency Medicine

## 2016-12-11 DIAGNOSIS — I10 Essential (primary) hypertension: Secondary | ICD-10-CM | POA: Insufficient documentation

## 2016-12-11 DIAGNOSIS — K439 Ventral hernia without obstruction or gangrene: Secondary | ICD-10-CM | POA: Insufficient documentation

## 2016-12-11 DIAGNOSIS — R51 Headache: Secondary | ICD-10-CM | POA: Insufficient documentation

## 2016-12-11 DIAGNOSIS — R079 Chest pain, unspecified: Secondary | ICD-10-CM | POA: Insufficient documentation

## 2016-12-11 DIAGNOSIS — E1165 Type 2 diabetes mellitus with hyperglycemia: Secondary | ICD-10-CM | POA: Insufficient documentation

## 2016-12-11 DIAGNOSIS — Z794 Long term (current) use of insulin: Secondary | ICD-10-CM | POA: Insufficient documentation

## 2016-12-11 DIAGNOSIS — Z79899 Other long term (current) drug therapy: Secondary | ICD-10-CM | POA: Insufficient documentation

## 2016-12-11 DIAGNOSIS — R739 Hyperglycemia, unspecified: Secondary | ICD-10-CM

## 2016-12-11 DIAGNOSIS — K429 Umbilical hernia without obstruction or gangrene: Secondary | ICD-10-CM | POA: Insufficient documentation

## 2016-12-11 DIAGNOSIS — R55 Syncope and collapse: Secondary | ICD-10-CM | POA: Insufficient documentation

## 2016-12-11 DIAGNOSIS — J45909 Unspecified asthma, uncomplicated: Secondary | ICD-10-CM | POA: Insufficient documentation

## 2016-12-11 DIAGNOSIS — Z7984 Long term (current) use of oral hypoglycemic drugs: Secondary | ICD-10-CM | POA: Insufficient documentation

## 2016-12-11 DIAGNOSIS — F17210 Nicotine dependence, cigarettes, uncomplicated: Secondary | ICD-10-CM | POA: Insufficient documentation

## 2016-12-11 LAB — BASIC METABOLIC PANEL
Anion gap: 8 (ref 5–15)
BUN: 14 mg/dL (ref 6–20)
CALCIUM: 9 mg/dL (ref 8.9–10.3)
CO2: 22 mmol/L (ref 22–32)
CREATININE: 0.56 mg/dL (ref 0.44–1.00)
Chloride: 106 mmol/L (ref 101–111)
GFR calc non Af Amer: >60 mL/min (ref >60)
Glucose, Bld: 269 mg/dL — ABNORMAL HIGH (ref 65–99)
Potassium: 3.3 mmol/L — ABNORMAL LOW (ref 3.5–5.1)
SODIUM: 136 mmol/L (ref 135–145)

## 2016-12-11 LAB — URINALYSIS, ROUTINE W REFLEX MICROSCOPIC
BILIRUBIN URINE: NEGATIVE
Bacteria, UA: NONE SEEN
HGB URINE DIPSTICK: NEGATIVE
KETONES UR: NEGATIVE mg/dL
LEUKOCYTES UA: NEGATIVE
NITRITE: NEGATIVE
PH: 6 (ref 5.0–8.0)
Protein, ur: NEGATIVE mg/dL
SPECIFIC GRAVITY, URINE: 1.04 — AB (ref 1.005–1.030)

## 2016-12-11 LAB — CBC
HCT: 41.4 % (ref 36.0–46.0)
Hemoglobin: 14.6 g/dL (ref 12.0–15.0)
MCH: 29.8 pg (ref 26.0–34.0)
MCHC: 35.3 g/dL (ref 30.0–36.0)
MCV: 84.5 fL (ref 78.0–100.0)
PLATELETS: 278 10*3/uL (ref 150–400)
RBC: 4.9 MIL/uL (ref 3.87–5.11)
RDW: 12.2 % (ref 11.5–15.5)
WBC: 8.7 10*3/uL (ref 4.0–10.5)

## 2016-12-11 LAB — CBG MONITORING, ED: Glucose-Capillary: 287 mg/dL — ABNORMAL HIGH (ref 65–99)

## 2016-12-11 LAB — POC URINE PREG, ED: PREG TEST UR: NEGATIVE

## 2016-12-11 MED ORDER — IOPAMIDOL (ISOVUE-300) INJECTION 61%
30.0000 mL | Freq: Once | INTRAVENOUS | Status: AC | PRN
  Administered 2016-12-11: 30 mL via ORAL

## 2016-12-11 MED ORDER — SODIUM CHLORIDE 0.9 % IV BOLUS (SEPSIS)
1000.0000 mL | Freq: Once | INTRAVENOUS | Status: AC
  Administered 2016-12-11: 1000 mL via INTRAVENOUS

## 2016-12-11 MED ORDER — ONDANSETRON HCL 4 MG/2ML IJ SOLN
4.0000 mg | Freq: Once | INTRAMUSCULAR | Status: AC
  Administered 2016-12-11: 4 mg via INTRAVENOUS
  Filled 2016-12-11: qty 2

## 2016-12-11 MED ORDER — IOPAMIDOL (ISOVUE-300) INJECTION 61%
INTRAVENOUS | Status: AC
  Administered 2016-12-11: 30 mL via ORAL
  Filled 2016-12-11: qty 30

## 2016-12-11 NOTE — ED Provider Notes (Signed)
[facility]-EMERGENCY DEPT Provider Note   CSN: [id number] Arrival date & time: 12/11/16  1306     History   Chief Complaint Chief Complaint  Patient presents with  . Hyperglycemia  . Abdominal Pain  . Loss of Consciousness    HPI Sarah Garrison is a 36 y.o. female.  HPI  Sarah Garrison is a 36 y.o. female with hx of DM, HTN, asthma, GERD, presents to ED with complaint of Elevated blood sugar, syncopal episode, abdominal pain. Patient states that her abdominal pain began yesterday. She states she feels a knot that is very tender. Last night patient states she was at work, works third shift, around 3 AM she had a syncopal episode. She states she remembers feeling dizzy and lightheaded and then waking up on the floor with EMS hitting her in the chest. Pt refused transport to ED because was worried about her car being left at work. States sugar was elevated greater than 300 at that time, she states she felt better after taking her insulin. States came for evaluation because continues to have abdominal pain, nausea, vomiting, not feeling well. Denies any prior abdominal surgeries. Denies being pregnant. No urinary symptoms. No vaginal discharge or bleeding. No other complaints.    Past Medical History:  Diagnosis Date  . Asthma   . Diabetes mellitus without complication (HCC)   . GERD (gastroesophageal reflux disease)   . Hypertension     Patient Active Problem List   Diagnosis Date Noted  . Asthma 07/30/2016  . Uses depot medroxyprogesterone acetate as primary birth control method 05/07/2016  . Encounter for pregnancy test 02/01/2016  . Leg swelling 09/29/2015  . Morbid obesity (HCC) 09/29/2015  . DM neuropathy, type II diabetes mellitus (HCC) 08/24/2015    Past Surgical History:  Procedure Laterality Date  . CESAREAN SECTION    . TUBAL LIGATION    . WISDOM TOOTH EXTRACTION      OB History    Gravida Para Term Preterm AB Living   5 4     1 4    SAB TAB Ectopic Multiple  Live Births   1               Home Medications    Prior to Admission medications   Medication Sig Start Date End Date Taking? Authorizing Provider  ACCU-CHEK AVIVA PLUS test strip  07/20/16   [provider]  ACCU-CHEK SOFTCLIX LANCETS lancets  07/20/16   [provider]  acetaminophen (TYLENOL) 500 MG tablet Take 1 tablet (500 mg total) by mouth every 6 (six) hours as needed. Patient not taking: Reported on 10/16/2016 09/03/16   Everlene Farrier, PA-C  amoxicillin (AMOXIL) 500 MG capsule Take 1 capsule (500 mg total) by mouth 3 (three) times daily. Patient not taking: Reported on 10/16/2016 10/05/16   Trixie Dredge, PA-C  amoxicillin-clavulanate (AUGMENTIN) 875-125 MG tablet Take 1 tablet by mouth 2 (two) times daily.    [provider]  butalbital-acetaminophen-caffeine (FIORICET, ESGIC) 50-325-40 MG tablet Take 1-2 tablets by mouth every 6 (six) hours as needed for headache. Patient not taking: Reported on 10/16/2016 10/05/16 10/05/17  Trixie Dredge, PA-C  ciprofloxacin (CIPRO) 500 MG tablet Take 1 tablet (500 mg total) by mouth every 12 (twelve) hours. 10/16/16   Derwood Kaplan, MD  diclofenac (VOLTAREN) 75 MG EC tablet Take 1 tablet (75 mg total) by mouth 2 (two) times daily. Do NOT take on an empty stomach.  Drink plenty of water while taking this medication Patient not  taking: Reported on 10/16/2016 09/27/16   Arnaldo Nataliamond, Michael S, MD  dicyclomine (BENTYL) 20 MG tablet Take 1 tablet (20 mg total) by mouth 2 (two) times daily. Patient not taking: Reported on 10/16/2016 09/05/16   Joy, Ines BloomerShawn C, PA-C  gabapentin (NEURONTIN) 300 MG capsule Take 1 capsule (300 mg total) by mouth at bedtime. 08/31/16   Hyatt, Max T, DPM  gabapentin (NEURONTIN) 600 MG tablet Take 1 tablet (600 mg total) by mouth 2 (two) times daily. 08/31/16   Hyatt, Max T, DPM  glimepiride (AMARYL) 2 MG tablet  07/27/16   [provider]  glipiZIDE (GLUCOTROL) 5 MG tablet Take 5 mg by mouth daily.  08/06/16    [provider]  insulin aspart (NOVOLOG) 100 UNIT/ML FlexPen Inject 15 Units into the skin daily. Take with largest meal. Patient taking differently: Inject 10 Units into the skin 3 (three) times daily with meals. Take with largest meal. 10/02/16   Langeland, Dawn T, MD  Insulin Detemir (LEVEMIR FLEXPEN) 100 UNIT/ML Pen Inject 30 Units into the skin 2 (two) times daily. Patient not taking: Reported on 10/16/2016 10/02/16   Pete GlatterLangeland, Dawn T, MD  LANTUS SOLOSTAR 100 UNIT/ML Solostar Pen Take 30 Units by mouth 2 (two) times daily. 07/31/16   [provider]  lisinopril (PRINIVIL,ZESTRIL) 10 MG tablet Take 1 tablet (10 mg total) by mouth daily. 09/11/16   Pete GlatterLangeland, Dawn T, MD  meloxicam (MOBIC) 7.5 MG tablet Take 1 T PO BID 09/11/16   Langeland, Dawn T, MD  montelukast (SINGULAIR) 10 MG tablet Take 10 mg by mouth at bedtime.  07/31/16   [provider]  naproxen (NAPROSYN) 500 MG tablet Take 1 tablet (500 mg total) by mouth 2 (two) times daily with a meal. 11/30/16   Yu, Amy V, PA-C  nystatin (MYCOSTATIN/NYSTOP) powder Apply topically 4 (four) times daily. 11/02/16   Anders SimmondsMcClung, Angela M, PA-C  omeprazole (PRILOSEC) 40 MG capsule Take 1 capsule (40 mg total) by mouth daily. 08/16/16   Anders SimmondsMcClung, Angela M, PA-C  ondansetron (ZOFRAN ODT) 4 MG disintegrating tablet Take 1-2 tablets (4-8 mg total) by mouth every 8 (eight) hours as needed for nausea or vomiting. Patient not taking: Reported on 10/16/2016 09/24/16   Ward, Layla MawKristen N, DO  ondansetron (ZOFRAN) 4 MG tablet Take 1 tablet (4 mg total) by mouth every 6 (six) hours. Patient not taking: Reported on 10/16/2016 08/20/16   Hayden RasmussenMabe, David, NP  PROVENTIL HFA 108 (90 Base) MCG/ACT inhaler Inhale 2 puffs into the lungs every 6 (six) hours as needed for wheezing or shortness of breath.  07/20/16   [provider]  pseudoephedrine (SUDAFED) 60 MG tablet Take 1 tablet (60 mg total) by mouth every 6 (six) hours as needed for congestion. Patient not  taking: Reported on 10/16/2016 10/05/16   Trixie DredgeWest, Emily, PA-C  traMADol (ULTRAM) 50 MG tablet Take 1 tablet (50 mg total) by mouth every 6 (six) hours as needed. 09/20/16   Elvina SidleLauenstein, Kurt, MD  TRUEPLUS PEN NEEDLES 32G X 4 MM MISC  07/27/16   [provider]    Family History Family History  Problem Relation Age of Onset  . Asthma Mother   . Asthma Father     Social History Social History  Substance Use Topics  . Smoking status: Current Every Day Smoker    Packs/day: 0.10    Types: Cigarettes    Last attempt to quit: 11/02/2013  . Smokeless tobacco: Never Used  . Alcohol use No  Allergies   Morphine and related; Metformin and related; Glyburide; Ivp dye [iodinated diagnostic agents]; Oxycodone; and Vicodin [hydrocodone-acetaminophen]   Review of Systems Review of Systems  Constitutional: Positive for fatigue. Negative for chills and fever.  Respiratory: Negative for cough, chest tightness and shortness of breath.   Cardiovascular: Positive for chest pain. Negative for palpitations and leg swelling.  Gastrointestinal: Positive for abdominal pain, nausea and vomiting. Negative for diarrhea.  Genitourinary: Negative for dysuria, flank pain, pelvic pain, vaginal bleeding, vaginal discharge and vaginal pain.  Musculoskeletal: Negative for arthralgias, myalgias, neck pain and neck stiffness.  Skin: Negative for rash.  Neurological: Positive for syncope, weakness and headaches. Negative for dizziness.  All other systems reviewed and are negative.    Physical Exam Updated Vital Signs BP 125/89 (BP Location: Right Arm)   Pulse (!) 105   Temp 98.2 F (36.8 C) (Oral)   Resp 16   Ht 5\' 5"  (1.651 m)   Wt 102.7 kg (226 lb 7 oz)   SpO2 96%   BMI 37.68 kg/m   Physical Exam  Constitutional: She is oriented to person, place, and time. She appears well-developed and well-nourished. No distress.  HENT:  Head: Normocephalic.  Eyes: Conjunctivae are normal.  Neck: Neck  supple.  Cardiovascular: Normal rate, regular rhythm and normal heart sounds.   Pulmonary/Chest: Effort normal and breath sounds normal. No respiratory distress. She has no wheezes. She has no rales.  Abdominal: Soft. Bowel sounds are normal. She exhibits no distension. There is tenderness. There is no rebound.  Periumbilical tenderness with mass palpated, hernia? Vs abscess  Musculoskeletal: She exhibits no edema.  Neurological: She is alert and oriented to person, place, and time.  Skin: Skin is warm and dry.  Psychiatric: She has a normal mood and affect. Her behavior is normal.  Nursing note and vitals reviewed.    ED Treatments / Results  Labs (all labs ordered are listed, but only abnormal results are displayed) Labs Reviewed  BASIC METABOLIC PANEL - Abnormal; Notable for the following:       Result Value   Potassium 3.3 (*)    Glucose, Bld 269 (*)    All other components within normal limits  URINALYSIS, ROUTINE W REFLEX MICROSCOPIC - Abnormal; Notable for the following:    Specific Gravity, Urine 1.040 (*)    Glucose, UA >=500 (*)    Squamous Epithelial / LPF 6-30 (*)    All other components within normal limits  CBG MONITORING, ED - Abnormal; Notable for the following:    Glucose-Capillary 287 (*)    All other components within normal limits  CBC  POC URINE PREG, ED    EKG  EKG Interpretation None       Radiology Ct Abdomen Pelvis Wo Contrast  Result Date: 12/11/2016 CLINICAL DATA:  Mid abdominal pain. Lump around umbilicus, possible hernia versus abscess. EXAM: CT ABDOMEN AND PELVIS WITHOUT CONTRAST TECHNIQUE: Multidetector CT imaging of the abdomen and pelvis was performed following the standard protocol without IV contrast. COMPARISON:  None. FINDINGS: Lower chest: Dependent atelectasis in the left greater than right lower lobe. No pleural fluid. Hepatobiliary: No focal liver abnormality is seen. No gallstones, gallbladder wall thickening, or biliary  dilatation. Pancreas: No ductal dilatation or inflammation. Spleen: Normal in size without focal abnormality. Adrenals/Urinary Tract: Normal adrenal glands. No hydronephrosis or perinephric edema. Ureters are decompressed. No urolithiasis. Urinary bladder is physiologically distended, no bladder wall thickening. Stomach/Bowel: Stomach is within normal limits. Appendix appears normal. No evidence  of bowel wall thickening, distention, or inflammatory changes. Vascular/Lymphatic: Abdominal aorta is normal in caliber. No abdominopelvic adenopathy. Reproductive: Uterus and bilateral adnexa are unremarkable. Other: Tiny fat containing supraumbilical hernia with narrow 5 mm neck. No inflammation of the herniated fat. Separate tiny fat containing umbilical hernia with minimal soft tissue thickening/ stranding inferiorly. No abscess. No free air, free fluid, or intra-abdominal fluid collection. Musculoskeletal: There are no acute or suspicious osseous abnormalities. IMPRESSION: 1. Tiny fat containing umbilical hernia with minimal soft tissue thickening/stranding inferiorly. No fluid collection or abscess. There is a separate tiny fat containing supraumbilical ventral abdominal wall hernia without inflammation. 2. No additional acute abnormality in the abdomen or pelvis. Electronically Signed   By: Rubye Oaks M.D.   On: 12/11/2016 19:29    Procedures Procedures (including critical care time)  Medications Ordered in ED Medications  sodium chloride 0.9 % bolus 1,000 mL (0 mLs Intravenous Stopped 12/11/16 1919)  ondansetron (ZOFRAN) injection 4 mg (4 mg Intravenous Given 12/11/16 1655)  iopamidol (ISOVUE-300) 61 % injection 30 mL (30 mLs Oral Contrast Given 12/11/16 1645)     Initial Impression / Assessment and Plan / ED Course  I have reviewed the triage vital signs and the nursing notes.  Pertinent labs & imaging results that were available during my care of the patient were reviewed by me and considered in  my medical decision making (see chart for details).     Patient with elevated blood sugar and syncopal episode at work at [employer]. No acute distress at this time. Exam unremarkable other than tenderness over her umbilicus, question hernia. Will give IV fluids, check EKG, orthostatic vital signs, labs. Will check pregnancy test and urinalysis. Will get CT abdomen and pelvis for further evaluation of this mass  Patient's labs show glucose of 269, potassium 3.3. Unremarkable EKG, urine, pregnancy test is negative. WBCs normal. Hemoglobin and hematocrit are normal. Patient is not orthostatic. Vital signs have remained normal during the ED stay. Patient is in no acute distress. She locally of chest pain or shortness of breath. Abdominal CT pending. Patient hydrated with IV fluids.   Patient's CT shows small umbilical and another small ventral hernias. Hernias containing fat only. Patient informed of the results, we'll plan to discharge home, rest, drink plenty of fluids, follow-up with [facility] for recheck of the hernias and further treatment. Return precautions discussed. Patient agrees to the plan.  Vitals:   12/11/16 1626 12/11/16 1845  BP: (!) 128/91 120/86  Pulse: 85 72  Resp: 16 14  Temp:       Final Clinical Impressions(s) / ED Diagnoses   Final diagnoses:  Hyperglycemia  Syncope, unspecified syncope type  Ventral hernia without obstruction or gangrene  Umbilical hernia without obstruction and without gangrene    New Prescriptions New Prescriptions   No medications on file     Jaynie Crumble, Cordelia Poche 12/11/16 1956    Loren Racer, MD 12/11/16 2309

## 2016-12-11 NOTE — Discharge Instructions (Signed)
Rest. Drink plenty of fluids. Make sure to take your diabetes medications. [facility] your [facility]. Return if worsening symptoms

## 2016-12-11 NOTE — ED Triage Notes (Signed)
Pt from home with c/o hyperglycemia, hypertension, and abdominal pain that began yesterday. Pt states she has been having diarrhea and constipation since last night. Pt also states she has been experiencing emesis this morning. Pt is not hypertensive nor tachycardic at time of assessment. Pt's blood sugar at time of assessment is 287 and she has type 2 DM. Pt states she experienced syncope while at work yesterday. Pt is alert and oriented x 4 and denies injuries from syncope

## 2016-12-12 ENCOUNTER — Ambulatory Visit: Payer: Self-pay | Attending: Internal Medicine | Admitting: Internal Medicine

## 2016-12-12 ENCOUNTER — Encounter: Payer: Self-pay | Admitting: Internal Medicine

## 2016-12-12 VITALS — BP 129/91 | HR 93 | Temp 98.9°F | Resp 16 | Wt 222.6 lb

## 2016-12-12 DIAGNOSIS — Z794 Long term (current) use of insulin: Secondary | ICD-10-CM | POA: Insufficient documentation

## 2016-12-12 DIAGNOSIS — Z79899 Other long term (current) drug therapy: Secondary | ICD-10-CM | POA: Insufficient documentation

## 2016-12-12 DIAGNOSIS — M25511 Pain in right shoulder: Secondary | ICD-10-CM | POA: Insufficient documentation

## 2016-12-12 DIAGNOSIS — E119 Type 2 diabetes mellitus without complications: Secondary | ICD-10-CM | POA: Insufficient documentation

## 2016-12-12 DIAGNOSIS — K219 Gastro-esophageal reflux disease without esophagitis: Secondary | ICD-10-CM | POA: Insufficient documentation

## 2016-12-12 DIAGNOSIS — R1084 Generalized abdominal pain: Secondary | ICD-10-CM | POA: Insufficient documentation

## 2016-12-12 DIAGNOSIS — J45909 Unspecified asthma, uncomplicated: Secondary | ICD-10-CM | POA: Insufficient documentation

## 2016-12-12 DIAGNOSIS — K439 Ventral hernia without obstruction or gangrene: Secondary | ICD-10-CM | POA: Insufficient documentation

## 2016-12-12 DIAGNOSIS — K429 Umbilical hernia without obstruction or gangrene: Secondary | ICD-10-CM | POA: Insufficient documentation

## 2016-12-12 DIAGNOSIS — E1140 Type 2 diabetes mellitus with diabetic neuropathy, unspecified: Secondary | ICD-10-CM

## 2016-12-12 DIAGNOSIS — R109 Unspecified abdominal pain: Secondary | ICD-10-CM | POA: Insufficient documentation

## 2016-12-12 DIAGNOSIS — I10 Essential (primary) hypertension: Secondary | ICD-10-CM | POA: Insufficient documentation

## 2016-12-12 LAB — GLUCOSE, POCT (MANUAL RESULT ENTRY)
POC Glucose: 282 mg/dl — AB (ref 70–99)
POC Glucose: 298 mg/dl — AB (ref 70–99)

## 2016-12-12 MED ORDER — LISINOPRIL 10 MG PO TABS: 10.0000 mg | ORAL_TABLET | Freq: Every day | ORAL | 3 refills | Status: DC

## 2016-12-12 MED ORDER — INSULIN ASPART 100 UNIT/ML ~~LOC~~ SOLN
10.0000 [IU] | Freq: Once | SUBCUTANEOUS | Status: AC
  Administered 2016-12-12: 10 [IU] via SUBCUTANEOUS

## 2016-12-12 MED ORDER — LANTUS SOLOSTAR 100 UNIT/ML ~~LOC~~ SOPN: 30.0000 [IU] | PEN_INJECTOR | Freq: Two times a day (BID) | SUBCUTANEOUS | 0 refills | Status: DC

## 2016-12-12 MED ORDER — INSULIN ASPART 100 UNIT/ML FLEXPEN: 10.0000 [IU] | PEN_INJECTOR | Freq: Three times a day (TID) | SUBCUTANEOUS | 3 refills | Status: DC

## 2016-12-12 MED ORDER — NYSTATIN 100000 UNIT/GM EX POWD: Freq: Four times a day (QID) | CUTANEOUS | 0 refills | Status: DC

## 2016-12-12 MED ORDER — FAMOTIDINE 20 MG PO TABS: 20.0000 mg | ORAL_TABLET | Freq: Two times a day (BID) | ORAL | 1 refills | Status: DC

## 2016-12-12 NOTE — Patient Instructions (Signed)
It was good to see you today.  We have reviewed your prior records including labs and tests today  Medications reviewed and updated Change omeprazole to pepcid twice daily for stomach acid to reduce abdominal pain - no other changes recommended at this time. Refill on medication(s) as discussed today.  we'll make referral to general surgery as per ED. Our office will contact you regarding appointment(s) once made.  Please schedule followup in 3-4 months, call sooner if problems.

## 2016-12-12 NOTE — Assessment & Plan Note (Signed)
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

## 2016-12-12 NOTE — Progress Notes (Signed)
Subjective:    Patient ID: Angelina Sherifflicia M Baillie, female    DOB: Oct 10, 1980, 36 y.o.   MRN: [id number]  HPI  Patient here for ED follow up - seen twice at ED in past month, first for R shoulder pain, then yesterday for abdominal pain associated with high cbg. Today she feels like abdominal pain is unchanged since onset in last 3 days. Hx same years ago, but cannot recall dx or what caused resolution in past. Most recent episode associated with passing out requiring EMS eval.  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 Asthma   . Diabetes mellitus without complication (HCC)   . GERD (gastroesophageal reflux disease)   . Hypertension     Review of Systems  Constitutional: Positive for fatigue. Negative for unexpected weight change.  Respiratory: Negative for cough and shortness of breath.   Cardiovascular: Negative for chest pain and leg swelling.  Gastrointestinal: Positive for abdominal pain and nausea. Negative for abdominal distention, anal bleeding, diarrhea and vomiting.  Genitourinary: Negative for difficulty urinating.  Psychiatric/Behavioral: Positive for sleep disturbance.       Objective:    Physical Exam  Constitutional: She appears well-developed and well-nourished. No distress.  obese  Cardiovascular: Normal rate, regular rhythm and normal heart sounds.   No murmur heard. Pulmonary/Chest: Effort normal and breath sounds normal. No respiratory distress.  Abdominal: Soft. Bowel sounds are normal. She exhibits no distension. There is no tenderness. There is no rebound and no guarding.  Musculoskeletal: She exhibits no edema.  Vitals reviewed.   BP (!) 129/91   Pulse 93   Temp 98.9 F (37.2 C) (Oral)   Resp 16   Wt 222 lb 9.6  oz (101 kg)   SpO2 98%   BMI 37.04 kg/m  Wt Readings from Last 3 Encounters:  12/12/16 222 lb 9.6 oz (101 kg)  12/11/16 226 lb 7 oz (102.7 kg)  11/02/16 224 lb (101.6 kg)     Lab Results  Component Value Date   WBC 8.7 12/11/2016   HGB 14.6 12/11/2016   HCT 41.4 12/11/2016   PLT 278 12/11/2016   GLUCOSE 269 (H) 12/11/2016   ALT 15 09/23/2016   AST 13 (L) 09/23/2016   NA 136 12/11/2016   K 3.3 (L) 12/11/2016   CL 106 12/11/2016   CREATININE 0.56 12/11/2016   BUN 14 12/11/2016   CO2 22 12/11/2016   HGBA1C 12.6 08/16/2016   MICROALBUR 0.9 10/13/2015    Ct Abdomen Pelvis Wo Contrast  Result Date: 12/11/2016 CLINICAL DATA:  Mid abdominal pain. Lump around umbilicus, possible hernia versus abscess. EXAM: CT ABDOMEN AND PELVIS WITHOUT CONTRAST TECHNIQUE: Multidetector CT imaging of the abdomen and pelvis was performed following the standard protocol without IV contrast. COMPARISON:  None. FINDINGS: Lower chest: Dependent atelectasis in the left greater than right lower lobe. No pleural fluid. Hepatobiliary: No focal liver abnormality is seen. No gallstones, gallbladder wall thickening, or biliary dilatation. Pancreas: No ductal dilatation or inflammation. Spleen: Normal in size without focal abnormality. Adrenals/Urinary Tract: Normal adrenal glands. No hydronephrosis or perinephric edema. Ureters are decompressed. No urolithiasis. Urinary bladder is physiologically distended, no bladder wall thickening. Stomach/Bowel: Stomach is within normal limits. Appendix appears normal. No evidence of bowel wall thickening, distention,  or inflammatory changes. Vascular/Lymphatic: Abdominal aorta is normal in caliber. No abdominopelvic adenopathy. Reproductive: Uterus and bilateral adnexa are unremarkable. Other: Tiny fat containing supraumbilical hernia with narrow 5 mm neck. No inflammation of the herniated fat. Separate tiny fat containing umbilical hernia with minimal soft tissue thickening/  stranding inferiorly. No abscess. No free air, free fluid, or intra-abdominal fluid collection. Musculoskeletal: There are no acute or suspicious osseous abnormalities. IMPRESSION: 1. Tiny fat containing umbilical hernia with minimal soft tissue thickening/stranding inferiorly. No fluid collection or abscess. There is a separate tiny fat containing supraumbilical ventral abdominal wall hernia without inflammation. 2. No additional acute abnormality in the abdomen or pelvis. Electronically Signed   By: Rubye Oaks M.D.   On: 12/11/2016 19:29       Assessment & Plan:   Problem List Items Addressed This Visit    DM neuropathy, type II diabetes mellitus (HCC)    Lab Results  Component Value Date   HGBA1C 12.6 08/16/2016   DM uncontrolled and pt non adherent to rx'd regimen May have element of DM gastropathy given other neuropathic symptoms - continue Neurontin and encouraged compliance of DM management as rx'd - refills on all meds provided      Relevant Medications   insulin aspart (novoLOG) injection 10 Units (Completed)   lisinopril (PRINIVIL,ZESTRIL) 10 MG tablet   LANTUS SOLOSTAR 100 UNIT/ML Solostar Pen   insulin aspart (NOVOLOG) 100 UNIT/ML FlexPen   Other Relevant Orders   POCT glucose (manual entry) (Completed)   POCT glucose (manual entry) (Completed)   Morbid obesity (HCC)    Wt Readings from Last 3 Encounters:  12/12/16 101 kg (222 lb 9.6 oz)  12/11/16 102.7 kg (226 lb 7 oz)  11/02/16 101.6 kg (224 lb)   Encouraged patient to work on lifestyle changes as discussed (low fat, low carb, increased protein diet; improved exercise efforts; weight loss) to control sugar, blood pressure and cholesterol levels and/or reduce risk of developing other medical problems.       Relevant Medications   insulin aspart (novoLOG) injection 10 Units (Completed)   LANTUS SOLOSTAR 100 UNIT/ML Solostar Pen   insulin aspart (NOVOLOG) 100 UNIT/ML FlexPen    Other Visit Diagnoses     Generalized abdominal pain    -  Primary   Relevant Orders   Ambulatory referral to General Surgery   Morbid obesity, unspecified obesity type (HCC)   (Chronic)     Relevant Medications   insulin aspart (novoLOG) injection 10 Units (Completed)   LANTUS SOLOSTAR 100 UNIT/ML Solostar Pen   insulin aspart (NOVOLOG) 100 UNIT/ML FlexPen   Umbilical hernia without obstruction or gangrene       Relevant Orders   Ambulatory referral to General Surgery   Ventral hernia without obstruction or gangrene       Relevant Orders   Ambulatory referral to General Surgery     Will refer to gen surg per ED recommended for eval of ventral and umbilical (tiny, unobstructed) hernias as noted on CT a/p as patient remains concerned these causing symptoms of abdominal pain and nausea and vomiting   Rene Paci, MD

## 2016-12-12 NOTE — Assessment & Plan Note (Signed)
Wt Readings from Last 3 Encounters:  12/12/16 101 kg (222 lb 9.6 oz)  12/11/16 102.7 kg (226 lb 7 oz)  11/02/16 101.6 kg (224 lb)   Encouraged patient to work on lifestyle changes as discussed (low fat, low carb, increased protein diet; improved exercise efforts; weight loss) to control sugar, blood pressure and cholesterol levels and/or reduce risk of developing other medical problems.

## 2016-12-13 ENCOUNTER — Encounter: Payer: Self-pay | Admitting: Family Medicine

## 2016-12-13 ENCOUNTER — Other Ambulatory Visit: Payer: Self-pay | Admitting: Family Medicine

## 2016-12-13 ENCOUNTER — Ambulatory Visit: Payer: Self-pay | Attending: Family Medicine | Admitting: Family Medicine

## 2016-12-13 VITALS — BP 130/85 | HR 107 | Temp 98.6°F | Resp 18 | Ht 64.0 in | Wt 223.0 lb

## 2016-12-13 DIAGNOSIS — E118 Type 2 diabetes mellitus with unspecified complications: Secondary | ICD-10-CM

## 2016-12-13 DIAGNOSIS — Z794 Long term (current) use of insulin: Secondary | ICD-10-CM | POA: Insufficient documentation

## 2016-12-13 DIAGNOSIS — Z91199 Patient's noncompliance with other medical treatment and regimen due to unspecified reason: Secondary | ICD-10-CM | POA: Insufficient documentation

## 2016-12-13 DIAGNOSIS — E1100 Type 2 diabetes mellitus with hyperosmolarity without nonketotic hyperglycemic-hyperosmolar coma (NKHHC): Secondary | ICD-10-CM | POA: Insufficient documentation

## 2016-12-13 DIAGNOSIS — Z79899 Other long term (current) drug therapy: Secondary | ICD-10-CM | POA: Insufficient documentation

## 2016-12-13 DIAGNOSIS — Z9119 Patient's noncompliance with other medical treatment and regimen: Secondary | ICD-10-CM

## 2016-12-13 DIAGNOSIS — K439 Ventral hernia without obstruction or gangrene: Secondary | ICD-10-CM | POA: Insufficient documentation

## 2016-12-13 DIAGNOSIS — R1084 Generalized abdominal pain: Secondary | ICD-10-CM | POA: Insufficient documentation

## 2016-12-13 DIAGNOSIS — E1140 Type 2 diabetes mellitus with diabetic neuropathy, unspecified: Secondary | ICD-10-CM

## 2016-12-13 DIAGNOSIS — IMO0002 Reserved for concepts with insufficient information to code with codable children: Secondary | ICD-10-CM

## 2016-12-13 DIAGNOSIS — R110 Nausea: Secondary | ICD-10-CM | POA: Insufficient documentation

## 2016-12-13 DIAGNOSIS — E1165 Type 2 diabetes mellitus with hyperglycemia: Secondary | ICD-10-CM | POA: Insufficient documentation

## 2016-12-13 LAB — POCT URINALYSIS DIPSTICK
BILIRUBIN UA: NEGATIVE
Blood, UA: NEGATIVE
GLUCOSE UA: 500
Ketones, UA: NEGATIVE
Leukocytes, UA: NEGATIVE
NITRITE UA: NEGATIVE
Protein, UA: NEGATIVE
Spec Grav, UA: 1.015 (ref 1.010–1.025)
UROBILINOGEN UA: 1 U/dL
pH, UA: 6 (ref 5.0–8.0)

## 2016-12-13 LAB — GLUCOSE, POCT (MANUAL RESULT ENTRY)
POC GLUCOSE: 358 mg/dL — AB (ref 70–99)
POC Glucose: 405 mg/dl — AB (ref 70–99)

## 2016-12-13 LAB — POCT GLYCOSYLATED HEMOGLOBIN (HGB A1C): Hemoglobin A1C: 12.7

## 2016-12-13 MED ORDER — ONDANSETRON 4 MG PO TBDP
4.0000 mg | ORAL_TABLET | Freq: Once | ORAL | Status: AC
  Administered 2016-12-13: 4 mg via ORAL

## 2016-12-13 MED ORDER — ONDANSETRON HCL 4 MG PO TABS: 4.0000 mg | ORAL_TABLET | Freq: Three times a day (TID) | ORAL | 0 refills | Status: DC | PRN

## 2016-12-13 MED ORDER — INSULIN ASPART 100 UNIT/ML ~~LOC~~ SOLN
20.0000 [IU] | Freq: Once | SUBCUTANEOUS | Status: AC
  Administered 2016-12-13: 20 [IU] via SUBCUTANEOUS

## 2016-12-13 MED FILL — ?LISINOPRIL 10 MG TABLET: 10 | 30 days supply | Qty: 30 | Fill #0

## 2016-12-13 MED FILL — FAMOTIDINE 20 MG TABLET: 20 | 30 days supply | Qty: 60 | Fill #0

## 2016-12-13 MED FILL — NYSTOP 100,000 UNITS/GM PWD: 100000 | 15 days supply | Qty: 15 | Fill #0

## 2016-12-13 NOTE — Progress Notes (Signed)
Patient is here for stomach pain

## 2016-12-13 NOTE — Patient Instructions (Addendum)
Follow up with referrals. Take blood sugars three times a day before meals and once before bed. Bring glucometer or blood glucose log to next office visit. Be consistent with taking medications for diabetes.   Blood Glucose Monitoring, Adult Monitoring your blood sugar (glucose) helps you manage your diabetes. It also helps you and your health care provider determine how well your diabetes management plan is working. Blood glucose monitoring involves checking your blood glucose as often as directed, and keeping a record (log) of your results over time. Why should I monitor my blood glucose? Checking your blood glucose regularly can:  Help you understand how food, exercise, illnesses, and medicines affect your blood glucose.  Let you know what your blood glucose is at any time. You can quickly tell if you are having low blood glucose (hypoglycemia) or high blood glucose (hyperglycemia).  Help you and your health care provider adjust your medicines as needed.  When should I check my blood glucose? Follow instructions from your health care provider about how often to check your blood glucose. This may depend on:  The type of diabetes you have.  How well-controlled your diabetes is.  Medicines you are taking.  If you have type 1 diabetes:  Check your blood glucose at least 2 times a day.  Also check your blood glucose: ? Before every insulin injection. ? Before and after exercise. ? Between meals. ? 2 hours after a meal. ? Occasionally between 2:00 a.m. and 3:00 a.m., as directed. ? Before potentially dangerous tasks, like driving or using heavy machinery. ? At bedtime.  You may need to check your blood glucose more often, up to 6-10 times a day: ? If you use an insulin pump. ? If you need multiple daily injections (MDI). ? If your diabetes is not well-controlled. ? If you are ill. ? If you have a history of severe hypoglycemia. ? If you have a history of not knowing when  your blood glucose is getting low (hypoglycemia unawareness). If you have type 2 diabetes:  If you take insulin or other diabetes medicines, check your blood glucose at least 2 times a day.  If you are on intensive insulin therapy, check your blood glucose at least 4 times a day. Occasionally, you may also need to check between 2:00 a.m. and 3:00 a.m., as directed.  Also check your blood glucose: ? Before and after exercise. ? Before potentially dangerous tasks, like driving or using heavy machinery.  You may need to check your blood glucose more often if: ? Your medicine is being adjusted. ? Your diabetes is not well-controlled. ? You are ill. What is a blood glucose log?  A blood glucose log is a record of your blood glucose readings. It helps you and your health care provider: ? Look for patterns in your blood glucose over time. ? Adjust your diabetes management plan as needed.  Every time you check your blood glucose, write down your result and notes about things that may be affecting your blood glucose, such as your diet and exercise for the day.  Most glucose meters store a record of glucose readings in the meter. Some meters allow you to download your records to a computer. How do I check my blood glucose? Follow these steps to get accurate readings of your blood glucose: Supplies needed   Blood glucose meter.  Test strips for your meter. Each meter has its own strips. You must use the strips that come with your meter.  A needle to prick your finger (lancet). Do not use lancets more than once.  A device that holds the lancet (lancing device).  A journal or log book to write down your results. Procedure  Wash your hands with soap and water.  Prick the side of your finger (not the tip) with the lancet. Use a different finger each time.  Gently rub the finger until a small drop of blood appears.  Follow instructions that come with your meter for inserting the test  strip, applying blood to the strip, and using your blood glucose meter.  Write down your result and any notes. Alternative testing sites  Some meters allow you to use areas of your body other than your finger (alternative sites) to test your blood.  If you think you may have hypoglycemia, or if you have hypoglycemia unawareness, do not use alternative sites. Use your finger instead.  Alternative sites may not be as accurate as the fingers, because blood flow is slower in these areas. This means that the result you get may be delayed, and it may be different from the result that you would get from your finger.  The most common alternative sites are: ? Forearm. ? Thigh. ? Palm of the hand. Additional tips  Always keep your supplies with you.  If you have questions or need help, all blood glucose meters have a 24-hour "hotline" number that you can call. You may also contact your health care provider.  After you use a few boxes of test strips, adjust (calibrate) your blood glucose meter by following instructions that came with your meter. This information is not intended to replace advice given to you by your health care provider. Make sure you discuss any questions you have with your health care provider. Document Released: 05/24/2003 Document Revised: 12/09/2015 Document Reviewed: 10/31/2015 Elsevier Interactive Patient Education  2017 [employer] Hernia A hernia happens when an organ or tissue inside your body pushes out through a weak spot in the belly (abdomen). Follow these instructions at home:  Avoid stretching or overusing (straining) the muscles near the hernia.  Do not lift anything heavier than 10 lb (4.5 kg).  Use the muscles in your leg when you lift something up. Do not use the muscles in your back.  When you cough, try to cough gently.  Eat a diet that has a lot of fiber. Eat lots of fruits and vegetables.  Drink enough fluids to keep your pee (urine) clear or  pale yellow. Try to drink 6-8 glasses of water a day.  Take medicines to make your poop soft (stool softeners) as told by your doctor.  Lose weight, if you are overweight.  Do not use any tobacco products, including cigarettes, chewing tobacco, or electronic cigarettes. If you need help quitting, ask your doctor.  Keep all follow-up visits as told by your doctor. This is important. Contact a doctor if:  The skin by the hernia gets puffy (swollen) or red.  The hernia is painful. Get help right away if:  You have a fever.  You have belly pain that is getting worse.  You feel sick to your stomach (nauseous) or you throw up (vomit).  You cannot push the hernia back in place by gently pressing on it while you are lying down.  The hernia: ? Changes in shape or size. ? Is stuck outside your belly. ? Changes color. ? Feels hard or tender. This information is not intended to replace advice given to you  by your health care provider. Make sure you discuss any questions you have with your health care provider. Document Released: 11/08/2009 Document Revised: 10/27/2015 Document Reviewed: 03/31/2014 Elsevier Interactive Patient Education  [employer]2018 [employer]

## 2016-12-13 NOTE — Progress Notes (Signed)
Subjective:  Patient ID: Sarah Garrison, female    DOB: 1980-06-17  Age: 36 y.o. MRN: [id number]  CC: No chief complaint on file.   HPI Sarah Garrison presents for abdominal pain. Recently diagnosed with abdominal and ventral hernia no n CT scan. She complains of abdominal pain. The pain is described as sharp, and is 9/10 in intensity. Pain is generalized. Onset was 3 days ago. Symptoms have been gradually worsening since. Aggravating factors: recumbency. Associated symptoms: poor appetite, nausea and vomiting.  Alleviating factors: antiemetics.The patient denies fever, hematochezia and melena.   Outpatient Medications Prior to Visit  Medication Sig Dispense Refill  . ACCU-CHEK AVIVA PLUS test strip   12  . ACCU-CHEK SOFTCLIX LANCETS lancets   12  . acetaminophen (TYLENOL) 500 MG tablet Take 1 tablet (500 mg total) by mouth every 6 (six) hours as needed. (Patient not taking: Reported on 10/16/2016) 30 tablet 0  . butalbital-acetaminophen-caffeine (FIORICET, ESGIC) 50-325-40 MG tablet Take 1-2 tablets by mouth every 6 (six) hours as needed for headache. (Patient not taking: Reported on 10/16/2016) 10 tablet 0  . dicyclomine (BENTYL) 20 MG tablet Take 1 tablet (20 mg total) by mouth 2 (two) times daily. (Patient not taking: Reported on 10/16/2016) 20 tablet 0  . famotidine (PEPCID) 20 MG tablet Take 1 tablet (20 mg total) by mouth 2 (two) times daily. 60 tablet 1  . gabapentin (NEURONTIN) 300 MG capsule Take 1 capsule (300 mg total) by mouth at bedtime. (Patient taking differently: Take 300 mg by mouth daily. ) 60 capsule 3  . gabapentin (NEURONTIN) 600 MG tablet Take 1 tablet (600 mg total) by mouth 2 (two) times daily. (Patient not taking: Reported on 12/11/2016) 120 tablet 3  . insulin aspart (NOVOLOG) 100 UNIT/ML FlexPen Inject 10 Units into the skin 3 (three) times daily with meals. Take with largest meal. 6 mL 3  . LANTUS SOLOSTAR 100 UNIT/ML Solostar Pen Inject 30 Units into the skin 2  (two) times daily. 15 mL 0  . lisinopril (PRINIVIL,ZESTRIL) 10 MG tablet Take 1 tablet (10 mg total) by mouth daily. 90 tablet 3  . nystatin (MYCOSTATIN/NYSTOP) powder Apply topically 4 (four) times daily. 15 g 0  . TRUEPLUS PEN NEEDLES 32G X 4 MM MISC   5   Facility-Administered Medications Prior to Visit  Medication Dose Route Frequency Provider Last Rate Last Dose  . sodium chloride 0.9 % bolus 1,000 mL  1,000 mL Intravenous Once Langeland, Dawn T, MD        ROS Review of Systems  Constitutional: Negative.   Respiratory: Negative.   Cardiovascular: Negative.   Gastrointestinal: Positive for abdominal pain, nausea and vomiting. Negative for blood in stool.  Genitourinary: Negative.   Skin: Negative.   Psychiatric/Behavioral: The patient is nervous/anxious.     Objective:  BP 130/85 (BP Location: Left Arm, Patient Position: Sitting, Cuff Size: Normal)   Pulse (!) 107   Temp 98.6 F (37 C) (Oral)   Resp 18   Ht 5\' 4"  (1.626 Garrison)   Wt 223 lb (101.2 kg)   SpO2 97%   BMI 38.28 kg/Garrison   BP/Weight 12/19/2016 12/13/2016 12/12/2016  Systolic BP 112 130 129  Diastolic BP 74 85 91  Wt. (Lbs) - 223 222.6  BMI - 38.28 37.04   Physical Exam  Constitutional: She is oriented to person, place, and time. She appears well-developed and well-nourished.  HENT:  Head: Normocephalic and atraumatic.  Right Ear: External ear normal.  Left Ear:  External ear normal.  Nose: Nose normal.  Mouth/Throat: Oropharynx is clear and moist.  Eyes: Pupils are equal, round, and reactive to light. Conjunctivae are normal.  Neck: Normal range of motion. Neck supple.  Cardiovascular: Normal rate, regular rhythm, normal heart sounds and intact distal pulses.   Pulmonary/Chest: Effort normal and breath sounds normal.  Abdominal: Soft. Bowel sounds are normal. There is tenderness (epigastric; RUQ/LUQ).  Lymphadenopathy:    She has no cervical adenopathy.  Neurological: She is alert and oriented to person, place,  and time.  Skin: Skin is warm and dry.  Psychiatric: Her mood appears anxious. She expresses no homicidal and no suicidal ideation. She expresses no suicidal plans and no homicidal plans.  Nursing note and vitals reviewed.  Assessment & Plan:   Problem List Items Addressed This Visit      Endocrine   Uncontrolled type 2 diabetes mellitus with complication, with long-term current use of insulin (HCC) - Primary   History of non-aderance patient reports not picking diabetic medicatons from pharmacy. Reports she will    pick up today.   Check CBG TID and bring glucometer and log to next office visit.   Follow up with clinical pharmacist in 2 weeks.   Follow up with PCP in 8 weeks.   Relevant Medications   insulin aspart (novoLOG) injection 20 Units   Other Relevant Orders   Glucose (CBG) (Completed)   HgB A1c (Completed)   Urinalysis Dipstick (Completed)     Other   Non-adherence to medical treatment    Other Visit Diagnoses    Generalized abdominal pain       History of hernia. Follow up with referrals.   Nausea       Relevant Medications   ondansetron (ZOFRAN-ODT) disintegrating tablet 4 mg   ondansetron (ZOFRAN) 4 MG tablet      Meds ordered this encounter  Medications  . insulin aspart (novoLOG) injection 20 Units  . ondansetron (ZOFRAN-ODT) disintegrating tablet 4 mg  . ondansetron (ZOFRAN) 4 MG tablet    Sig: Take 1 tablet (4 mg total) by mouth every 8 (eight) hours as needed for nausea or vomiting.    Dispense:  30 tablet    Refill:  0    Order Specific Question:   Supervising Provider    Answer:   Quentin Angst [[id number]]    Follow-up: Return in about 2 weeks (around 12/27/2016), or if symptoms worsen or fail to improve, for DM with Stacy .   Lizbeth Bark FNP

## 2016-12-19 ENCOUNTER — Encounter (HOSPITAL_COMMUNITY): Payer: Self-pay | Admitting: Family Medicine

## 2016-12-19 ENCOUNTER — Ambulatory Visit (HOSPITAL_COMMUNITY)
Admission: EM | Admit: 2016-12-19 | Discharge: 2016-12-19 | Disposition: A | Discharge status: Home / Self Care | Payer: Medicaid Other | Attending: Internal Medicine | Admitting: Internal Medicine

## 2016-12-19 DIAGNOSIS — R519 Headache, unspecified: Secondary | ICD-10-CM

## 2016-12-19 DIAGNOSIS — R51 Headache: Secondary | ICD-10-CM

## 2016-12-19 NOTE — ED Provider Notes (Signed)
CSN: [id number]     Arrival date & time 12/19/16  1933 History   None    Chief Complaint  Patient presents with  . Migraine   (Consider location/radiation/quality/duration/timing/severity/associated sxs/prior Treatment) 36 year old female with past medical history of uncontrolled diabetes, hypertension, GERD, asthma comes in with one-day history of headache. She was seen last week at the emergency department due to hyperglycemia and syncopal episode. She states that she had CPR done, and at the ED the found 2 hernias in her abdomen. She states she started having a headache  today, that is frontal and throbbing. She has mild photophobia and phonophobia. She is also having some ear pain. She states she continues to have abdominal pain, where they had "pushed the mass back in". She denies weakness, lightheadedness, dizziness, confusion, diaphoresis. Denies nausea, vomiting, diarrhea. She checked her blood glucose prior to arriving, and states it's 274, which is low for her. She states that prior to changing her medicines, her blood glucose, and he ran into the 500s. She had been seen at her primary care last week, and has a follow-up appointment in the near future. She is still passing gas, and stool.       Past Medical History:  Diagnosis Date  . Asthma   . Diabetes mellitus without complication (HCC)   . GERD (gastroesophageal reflux disease)   . Hypertension    Past Surgical History:  Procedure Laterality Date  . CESAREAN SECTION    . TUBAL LIGATION    . WISDOM TOOTH EXTRACTION     Family History  Problem Relation Age of Onset  . Asthma Mother   . Asthma Father    Social History  Substance Use Topics  . Smoking status: Current Every Day Smoker    Packs/day: 0.10    Types: Cigarettes    Last attempt to quit: 11/02/2013  . Smokeless tobacco: Never Used  . Alcohol use No   OB History    Gravida Para Term Preterm AB Living   5 4     1 4    SAB TAB Ectopic Multiple Live Births   1             Review of Systems  Reason unable to perform ROS: As history of present illness above.    Allergies  Morphine and related; Metformin and related; Glyburide; Ivp dye [iodinated diagnostic agents]; Oxycodone; and Vicodin [hydrocodone-acetaminophen]  Home Medications   Prior to Admission medications   Medication Sig Start Date End Date Taking? Authorizing Provider  ACCU-CHEK AVIVA PLUS test strip  07/20/16   [provider]  ACCU-CHEK SOFTCLIX LANCETS lancets  07/20/16   [provider]  acetaminophen (TYLENOL) 500 MG tablet Take 1 tablet (500 mg total) by mouth every 6 (six) hours as needed. Patient not taking: Reported on 10/16/2016 09/03/16   Everlene Farrier, PA-C  butalbital-acetaminophen-caffeine (FIORICET, ESGIC) [phone] MG tablet Take 1-2 tablets by mouth every 6 (six) hours as needed for headache. Patient not taking: Reported on 10/16/2016 10/05/16 10/05/17  Trixie Dredge, PA-C  dicyclomine (BENTYL) 20 MG tablet Take 1 tablet (20 mg total) by mouth 2 (two) times daily. Patient not taking: Reported on 10/16/2016 09/05/16   Joy, Ines Bloomer C, PA-C  famotidine (PEPCID) 20 MG tablet Take 1 tablet (20 mg total) by mouth 2 (two) times daily. 12/12/16 12/12/17  Newt Lukes, MD  gabapentin (NEURONTIN) 300 MG capsule Take 1 capsule (300 mg total) by mouth at bedtime. Patient taking differently: Take 300 mg by mouth  daily.  08/31/16   Hyatt, Max T, DPM  gabapentin (NEURONTIN) 600 MG tablet Take 1 tablet (600 mg total) by mouth 2 (two) times daily. Patient not taking: Reported on 12/11/2016 08/31/16   Hyatt, Max T, DPM  insulin aspart (NOVOLOG) 100 UNIT/ML FlexPen Inject 10 Units into the skin 3 (three) times daily with meals. Take with largest meal. 12/12/16   Newt LukesLeschber, Valerie A, MD  LANTUS SOLOSTAR 100 UNIT/ML Solostar Pen Inject 30 Units into the skin 2 (two) times daily. 12/12/16   Newt LukesLeschber, Valerie A, MD  lisinopril (PRINIVIL,ZESTRIL) 10 MG tablet Take 1 tablet (10 mg  total) by mouth daily. 12/12/16   Newt LukesLeschber, Valerie A, MD  nystatin (MYCOSTATIN/NYSTOP) powder Apply topically 4 (four) times daily. 12/12/16   Newt LukesLeschber, Valerie A, MD  ondansetron (ZOFRAN) 4 MG tablet Take 1 tablet (4 mg total) by mouth every 8 (eight) hours as needed for nausea or vomiting. 12/13/16   Lizbeth BarkHairston, Mandesia R, FNP  TRUEPLUS PEN NEEDLES 32G X 4 MM MISC  07/27/16   [provider]   Meds Ordered and Administered this Visit  Medications - No data to display  BP 112/74   Pulse (!) 115   Temp 99.1 F (37.3 C)   Resp 18   SpO2 98%  No data found.   Physical Exam  Constitutional: She is oriented to person, place, and time. She appears well-developed and well-nourished. No distress.  HENT:  Head: Normocephalic and atraumatic.  Right Ear: External ear and ear canal normal. Tympanic membrane is not erythematous and not bulging. A middle ear effusion is present.  Left Ear: External ear and ear canal normal. Tympanic membrane is not erythematous and not bulging. A middle ear effusion is present.  Nose: Right sinus exhibits frontal sinus tenderness. Right sinus exhibits no maxillary sinus tenderness. Left sinus exhibits frontal sinus tenderness. Left sinus exhibits no maxillary sinus tenderness.  Mouth/Throat: Uvula is midline, oropharynx is clear and moist and mucous membranes are normal. Tonsils are 1+ on the right. Tonsils are 1+ on the left.  Eyes: Pupils are equal, round, and reactive to light. Conjunctivae are normal.  Neck: Normal range of motion. Neck supple.  Cardiovascular: Normal rate, regular rhythm and normal heart sounds.  Exam reveals no gallop and no friction rub.   No murmur heard. Pulmonary/Chest: Effort normal and breath sounds normal. She has no decreased breath sounds. She has no wheezes. She has no rhonchi. She has no rales.  Abdominal: Soft. Bowel sounds are normal. She exhibits no mass. There is tenderness (periumbilical where hernia was pushed in). There  is no rebound and no guarding.  Patient with confirmed hernia. Did not attempt to reproduce hernia by doing valsalva maneuver. Hernia currently reduced.   Lymphadenopathy:    She has no cervical adenopathy.  Neurological: She is alert and oriented to person, place, and time.  Skin: Skin is warm and dry.  Psychiatric: She has a normal mood and affect. Her behavior is normal. Judgment normal.    Urgent Care Course     Procedures (including critical care time)  Labs Review Labs Reviewed - No data to display  Imaging Review No results found.     MDM   1. Acute intractable headache, unspecified headache type    Discussed with patient possible causes of headache, including sinus pressure, stress, hypo/hyperglycemia. Patient blood sugar is 274 prior to arrival and without other hypo/hyperglycemic symptoms. Given uncontrolled diabetes, will refrain from toradol and solumedrol injection. Given patient's pain is  frontal with ear effusion, will treat with nasal spray and otc antihistamine and decongestants. Discussed with patient to continue to monitor her blood glucose and any worsening of symptoms. Given abdominal pain is where hernia is, and has continued to be there after hernia reduction at the ED, patient to continue to monitor symptoms. Patient to go to the ED immediately if having increased abdominal pain, unable to pass gas/stool, N/V. Patient expressed understanding and agrees to plan.    Belinda Fisher, PA-C 12/19/16 2042

## 2016-12-19 NOTE — ED Triage Notes (Signed)
Pt here for headache x 3 days. sts she thinks her BP is elevated. sts also some stomach pain and diarrhea.

## 2016-12-19 NOTE — Discharge Instructions (Signed)
Your headache could be caused by changes in your sugar level, but also sinus pressure. Take your nasal spray and over the counter allergy medicine like [id number]-[id number]. Keep hydrated. Make sure you continue to pass gas and stool. Continue to monitor blood sugars. Follow up with PCP as scheduled. Continue to monitor symptoms, and follow up for reevaluation if symptoms worsens.

## 2016-12-22 ENCOUNTER — Encounter (HOSPITAL_COMMUNITY): Payer: Self-pay | Admitting: Emergency Medicine

## 2016-12-22 ENCOUNTER — Emergency Department (HOSPITAL_COMMUNITY)
Admission: EM | Admit: 2016-12-22 | Discharge: 2016-12-23 | Disposition: A | Discharge status: Home / Self Care | Payer: Medicaid Other | Attending: Emergency Medicine | Admitting: Emergency Medicine

## 2016-12-22 DIAGNOSIS — I10 Essential (primary) hypertension: Secondary | ICD-10-CM | POA: Insufficient documentation

## 2016-12-22 DIAGNOSIS — K439 Ventral hernia without obstruction or gangrene: Secondary | ICD-10-CM

## 2016-12-22 DIAGNOSIS — F17210 Nicotine dependence, cigarettes, uncomplicated: Secondary | ICD-10-CM | POA: Insufficient documentation

## 2016-12-22 DIAGNOSIS — R112 Nausea with vomiting, unspecified: Secondary | ICD-10-CM | POA: Insufficient documentation

## 2016-12-22 DIAGNOSIS — J45909 Unspecified asthma, uncomplicated: Secondary | ICD-10-CM | POA: Insufficient documentation

## 2016-12-22 DIAGNOSIS — R739 Hyperglycemia, unspecified: Secondary | ICD-10-CM

## 2016-12-22 DIAGNOSIS — E1165 Type 2 diabetes mellitus with hyperglycemia: Secondary | ICD-10-CM | POA: Insufficient documentation

## 2016-12-22 LAB — I-STAT BETA HCG BLOOD, ED (MC, WL, AP ONLY)

## 2016-12-22 LAB — URINALYSIS, ROUTINE W REFLEX MICROSCOPIC
BACTERIA UA: NONE SEEN
BILIRUBIN URINE: NEGATIVE
HGB URINE DIPSTICK: NEGATIVE
Ketones, ur: NEGATIVE mg/dL
LEUKOCYTES UA: NEGATIVE
NITRITE: NEGATIVE
Protein, ur: NEGATIVE mg/dL
RBC / HPF: NONE SEEN RBC/hpf (ref 0–5)
SPECIFIC GRAVITY, URINE: 1.035 — AB (ref 1.005–1.030)
Squamous Epithelial / LPF: NONE SEEN
pH: 5 (ref 5.0–8.0)

## 2016-12-22 LAB — CBC
HEMATOCRIT: 38.5 % (ref 36.0–46.0)
HEMOGLOBIN: 13.5 g/dL (ref 12.0–15.0)
MCH: 30.3 pg (ref 26.0–34.0)
MCHC: 35.1 g/dL (ref 30.0–36.0)
MCV: 86.3 fL (ref 78.0–100.0)
Platelets: 271 10*3/uL (ref 150–400)
RBC: 4.46 MIL/uL (ref 3.87–5.11)
RDW: 12.3 % (ref 11.5–15.5)
WBC: 7.6 10*3/uL (ref 4.0–10.5)

## 2016-12-22 LAB — COMPREHENSIVE METABOLIC PANEL
ALT: 15 U/L (ref 14–54)
ANION GAP: 8 (ref 5–15)
AST: 15 U/L (ref 15–41)
Albumin: 3.6 g/dL (ref 3.5–5.0)
Alkaline Phosphatase: 129 U/L — ABNORMAL HIGH (ref 38–126)
BILIRUBIN TOTAL: 0.6 mg/dL (ref 0.3–1.2)
BUN: 12 mg/dL (ref 6–20)
CALCIUM: 9.1 mg/dL (ref 8.9–10.3)
CO2: 22 mmol/L (ref 22–32)
Chloride: 106 mmol/L (ref 101–111)
Creatinine, Ser: 0.72 mg/dL (ref 0.44–1.00)
GFR calc Af Amer: >60 mL/min (ref >60)
Glucose, Bld: 405 mg/dL — ABNORMAL HIGH (ref 65–99)
POTASSIUM: 4.1 mmol/L (ref 3.5–5.1)
Sodium: 136 mmol/L (ref 135–145)
TOTAL PROTEIN: 6.6 g/dL (ref 6.5–8.1)

## 2016-12-22 LAB — LIPASE, BLOOD: Lipase: 19 U/L (ref 11–51)

## 2016-12-22 MED ORDER — ONDANSETRON HCL 4 MG PO TABS: 4.0000 mg | ORAL_TABLET | Freq: Four times a day (QID) | ORAL | 0 refills | Status: DC

## 2016-12-22 MED ORDER — ONDANSETRON 4 MG PO TBDP
4.0000 mg | ORAL_TABLET | Freq: Once | ORAL | Status: AC
  Administered 2016-12-22: 4 mg via ORAL
  Filled 2016-12-22: qty 1

## 2016-12-22 MED ORDER — INSULIN ASPART 100 UNIT/ML ~~LOC~~ SOLN
10.0000 [IU] | Freq: Once | SUBCUTANEOUS | Status: AC
  Administered 2016-12-22: 10 [IU] via SUBCUTANEOUS
  Filled 2016-12-22: qty 1

## 2016-12-22 NOTE — ED Triage Notes (Signed)
Pt reports having nausea and vomiting for the last 2 weeks. Reports last eating this afternoon.

## 2016-12-22 NOTE — ED Provider Notes (Signed)
[facility]-EMERGENCY DEPT Provider Note   CSN: [id number] Arrival date & time: 12/22/16  1940     History   Chief Complaint Chief Complaint  Patient presents with  . Abdominal Pain    HPI Sarah Garrison is a 36 y.o. female.  HPI   36 year old female with a history of diabetes, hypertension, asthma, GERD, umbilical hernia presents to the ED with complaints of abdominal pain.  Patient notes that she has been having ongoing abdominal pain for several weeks.  She was seen numerous times with similar complaints.  She reports she was diagnosed with a fat-containing hernia recently on July 10.  She was referred togeneral surgery but has not seen them yet.  She notes that she continues to have abdominal discomfort, has difficulty going to work as it is worsened when she moves around.  She notes she is had some nausea and episode of vomiting today.  She was able to eat a small amount of food but vomited shortly after.  She reports she is able to tolerate liquids.  Patient denies any fevers at home.  She notes that her sugars rapidly fluctuate.  Patient notes she continues to pass gas, the pain is unchanged in her abdomen with no worsening swelling.   Past Medical History:  Diagnosis Date  . Asthma   . Diabetes mellitus without complication (HCC)   . GERD (gastroesophageal reflux disease)   . Hypertension     Patient Active Problem List   Diagnosis Date Noted  . Uncontrolled type 2 diabetes mellitus with complication, with long-term current use of insulin (HCC) 12/13/2016  . Non-adherence to medical treatment 12/13/2016  . Asthma 07/30/2016  . Uses depot medroxyprogesterone acetate as primary birth control method 05/07/2016  . Encounter for pregnancy test 02/01/2016  . Leg swelling 09/29/2015  . Morbid obesity (HCC) 09/29/2015  . DM neuropathy, type II diabetes mellitus (HCC) 08/24/2015    Past Surgical History:  Procedure Laterality Date  . CESAREAN SECTION    . TUBAL LIGATION    .  WISDOM TOOTH EXTRACTION      OB History    Gravida Para Term Preterm AB Living   5 4     1 4    SAB TAB Ectopic Multiple Live Births   1              Home Medications    Prior to Admission medications   Medication Sig Start Date End Date Taking? Authorizing Provider  ACCU-CHEK AVIVA PLUS test strip  07/20/16  Yes [provider]  ACCU-CHEK SOFTCLIX LANCETS lancets  07/20/16  Yes [provider]  famotidine (PEPCID) 20 MG tablet Take 1 tablet (20 mg total) by mouth 2 (two) times daily. 12/12/16 12/12/17 Yes Newt LukesLeschber, Valerie A, MD  gabapentin (NEURONTIN) 600 MG tablet Take 1 tablet (600 mg total) by mouth 2 (two) times daily. Patient taking differently: Take 600 mg by mouth daily as needed.  08/31/16  Yes Hyatt, Max T, DPM  insulin aspart (NOVOLOG) 100 UNIT/ML FlexPen Inject 10 Units into the skin 3 (three) times daily with meals. Take with largest meal. 12/12/16  Yes Newt LukesLeschber, Valerie A, MD  LANTUS SOLOSTAR 100 UNIT/ML Solostar Pen Inject 30 Units into the skin 2 (two) times daily. 12/12/16  Yes Newt LukesLeschber, Valerie A, MD  lisinopril (PRINIVIL,ZESTRIL) 10 MG tablet Take 1 tablet (10 mg total) by mouth daily. 12/12/16  Yes Newt LukesLeschber, Valerie A, MD  naproxen (NAPROSYN) 500 MG tablet Take 500 mg by mouth 2 (two) times  daily. 12/11/16  Yes [provider]  nystatin (MYCOSTATIN/NYSTOP) powder Apply topically 4 (four) times daily. 12/12/16  Yes Newt Lukes, MD  TRUEPLUS PEN NEEDLES 32G X 4 MM MISC  07/27/16  Yes [provider]  acetaminophen (TYLENOL) 500 MG tablet Take 1 tablet (500 mg total) by mouth every 6 (six) hours as needed. Patient not taking: Reported on 10/16/2016 09/03/16   Everlene Farrier, PA-C  butalbital-acetaminophen-caffeine (FIORICET, ESGIC) [phone] MG tablet Take 1-2 tablets by mouth every 6 (six) hours as needed for headache. Patient not taking: Reported on 10/16/2016 10/05/16 10/05/17  Trixie Dredge, PA-C  dicyclomine (BENTYL) 20 MG tablet Take 1  tablet (20 mg total) by mouth 2 (two) times daily. Patient not taking: Reported on 10/16/2016 09/05/16   Joy, Ines Bloomer C, PA-C  gabapentin (NEURONTIN) 300 MG capsule Take 1 capsule (300 mg total) by mouth at bedtime. Patient not taking: Reported on 12/22/2016 08/31/16   Hyatt, Max T, DPM  ondansetron (ZOFRAN) 4 MG tablet Take 1 tablet (4 mg total) by mouth every 6 (six) hours. 12/22/16   Eyvonne Mechanic, PA-C    Family History Family History  Problem Relation Age of Onset  . Asthma Mother   . Asthma Father     Social History Social History  Substance Use Topics  . Smoking status: Current Every Day Smoker    Packs/day: 0.10    Types: Cigarettes    Last attempt to quit: 11/02/2013  . Smokeless tobacco: Never Used  . Alcohol use No     Allergies   Morphine and related; Metformin and related; Glyburide; Ivp dye [iodinated diagnostic agents]; Oxycodone; and Vicodin [hydrocodone-acetaminophen]   Review of Systems Review of Systems  All other systems reviewed and are negative.  Physical Exam Updated Vital Signs BP (!) 154/99 (BP Location: Left Arm)   Pulse (!) 112   Temp 98.2 F (36.8 C) (Oral)   Resp 16   Ht 5\' 4"  (1.626 m)   Wt 102.8 kg (226 lb 11.2 oz)   SpO2 100%   BMI 38.91 kg/m   Physical Exam  Constitutional: She is oriented to person, place, and time. She appears well-developed and well-nourished.  HENT:  Head: Normocephalic and atraumatic.  Eyes: Pupils are equal, round, and reactive to light. Conjunctivae are normal. Right eye exhibits no discharge. Left eye exhibits no discharge. No scleral icterus.  Neck: Normal range of motion. No JVD present. No tracheal deviation present.  Pulmonary/Chest: Effort normal. No stridor.  Abdominal:  Small nodule in the supraumbilical region- TTP- Remained of abdomin soft and non tender   Neurological: She is alert and oriented to person, place, and time. Coordination normal.  Psychiatric: She has a normal mood and affect. Her  behavior is normal. Judgment and thought content normal.  Nursing note and vitals reviewed.   ED Treatments / Results  Labs (all labs ordered are listed, but only abnormal results are displayed) Labs Reviewed  COMPREHENSIVE METABOLIC PANEL - Abnormal; Notable for the following:       Result Value   Glucose, Bld 405 (*)    Alkaline Phosphatase 129 (*)    All other components within normal limits  URINALYSIS, ROUTINE W REFLEX MICROSCOPIC - Abnormal; Notable for the following:    Color, Urine STRAW (*)    Specific Gravity, Urine 1.035 (*)    Glucose, UA >=500 (*)    All other components within normal limits  LIPASE, BLOOD  CBC  I-STAT BETA HCG BLOOD, ED ([facility], [facility], [facility]  ONLY)    EKG  EKG Interpretation None      Radiology No results found.  Procedures Procedures (including critical care time)  Medications Ordered in ED Medications  ondansetron (ZOFRAN-ODT) disintegrating tablet 4 mg (4 mg Oral Given 12/22/16 2027)  insulin aspart (novoLOG) injection 10 Units (10 Units Subcutaneous Given 12/22/16 2238)     Initial Impression / Assessment and Plan / ED Course  I have reviewed the triage vital signs and the nursing notes.  Pertinent labs & imaging results that were available during my care of the patient were reviewed by me and considered in my medical decision making (see chart for details).    Final Clinical Impressions(s) / ED Diagnoses   Final diagnoses:  Ventral hernia without obstruction or gangrene  Hyperglycemia   Labs: [place], Lipase, CMP, CBC  Imaging:  Consults:  Therapeutics: Insulin, Zofran  Discharge Meds:   Assessment/Plan: 36 year old female presents today with complaints of nausea vomiting abdominal pain.  Patient's abdominal pain is secondary to fat-containing hernia.  This is unchanged from her baseline.  Patient reports some nausea and vomiting.  No signs of obstruction or concerning findings today.  She was given antinausea medication here which  significantly improved her symptoms, tolerating p.o. without difficulty.  Patient is an uncontrolled diabetic with numerous ED visits with hyperglycemia.  She is at 405 today.  She is tolerating p.o., she will be given fluids, and a dose of her home insulin here.  Patient has no signs of DKA or hyperosmolar state patient will continue fluid intake and recheck her blood sugar tonight before going to bed..  She already  has a referral for general surgery, she is encouraged to follow-up with general surgery for her hernia, encouraged to use her diabetic medications as indicated.  Return as needed for any new or worsening signs or symptoms.  She verbalized understanding and agreement to today's plan.   New Prescriptions New Prescriptions   ONDANSETRON (ZOFRAN) 4 MG TABLET    Take 1 tablet (4 mg total) by mouth every 6 (six) hours.     Eyvonne Mechanic, PA-C 12/22/16 2258    Mancel Bale, MD 12/22/16 [phone]

## 2016-12-22 NOTE — Discharge Instructions (Signed)
Please read attached information.  Your blood sugar was elevated here today.  Please check your blood sugar when getting home.  Please continue drinking plenty water use antinausea medication as needed.  If develop any new or worsening signs or symptoms return immediately to the emergency room.

## 2016-12-27 ENCOUNTER — Ambulatory Visit: Payer: Medicaid Other | Attending: Internal Medicine

## 2016-12-27 ENCOUNTER — Ambulatory Visit: Payer: Medicaid Other

## 2016-12-31 ENCOUNTER — Ambulatory Visit: Payer: Medicaid Other

## 2017-01-01 ENCOUNTER — Ambulatory Visit: Payer: Medicaid Other | Admitting: Pharmacist

## 2017-01-02 ENCOUNTER — Encounter: Payer: Self-pay | Admitting: Internal Medicine

## 2017-01-02 ENCOUNTER — Telehealth: Payer: Self-pay | Admitting: Internal Medicine

## 2017-01-02 ENCOUNTER — Ambulatory Visit: Payer: Self-pay | Attending: Internal Medicine | Admitting: Internal Medicine

## 2017-01-02 ENCOUNTER — Ambulatory Visit (HOSPITAL_COMMUNITY)
Admission: EM | Admit: 2017-01-02 | Discharge: 2017-01-02 | Disposition: A | Discharge status: Nursing Home (Includes ICF) | Payer: Medicaid Other

## 2017-01-02 ENCOUNTER — Encounter (HOSPITAL_COMMUNITY): Payer: Self-pay | Admitting: Emergency Medicine

## 2017-01-02 VITALS — BP 131/83 | HR 92 | Temp 98.8°F | Resp 16 | Wt 219.8 lb

## 2017-01-02 DIAGNOSIS — IMO0002 Reserved for concepts with insufficient information to code with codable children: Secondary | ICD-10-CM

## 2017-01-02 DIAGNOSIS — E118 Type 2 diabetes mellitus with unspecified complications: Secondary | ICD-10-CM

## 2017-01-02 DIAGNOSIS — R1033 Periumbilical pain: Secondary | ICD-10-CM

## 2017-01-02 DIAGNOSIS — Z91041 Radiographic dye allergy status: Secondary | ICD-10-CM | POA: Insufficient documentation

## 2017-01-02 DIAGNOSIS — Z885 Allergy status to narcotic agent status: Secondary | ICD-10-CM | POA: Insufficient documentation

## 2017-01-02 DIAGNOSIS — K219 Gastro-esophageal reflux disease without esophagitis: Secondary | ICD-10-CM | POA: Insufficient documentation

## 2017-01-02 DIAGNOSIS — J45909 Unspecified asthma, uncomplicated: Secondary | ICD-10-CM | POA: Insufficient documentation

## 2017-01-02 DIAGNOSIS — Z888 Allergy status to other drugs, medicaments and biological substances status: Secondary | ICD-10-CM | POA: Insufficient documentation

## 2017-01-02 DIAGNOSIS — Z794 Long term (current) use of insulin: Secondary | ICD-10-CM | POA: Insufficient documentation

## 2017-01-02 DIAGNOSIS — R109 Unspecified abdominal pain: Secondary | ICD-10-CM

## 2017-01-02 DIAGNOSIS — Z9119 Patient's noncompliance with other medical treatment and regimen: Secondary | ICD-10-CM

## 2017-01-02 DIAGNOSIS — Z91199 Patient's noncompliance with other medical treatment and regimen due to unspecified reason: Secondary | ICD-10-CM

## 2017-01-02 DIAGNOSIS — E1165 Type 2 diabetes mellitus with hyperglycemia: Secondary | ICD-10-CM | POA: Insufficient documentation

## 2017-01-02 DIAGNOSIS — I10 Essential (primary) hypertension: Secondary | ICD-10-CM | POA: Insufficient documentation

## 2017-01-02 DIAGNOSIS — K429 Umbilical hernia without obstruction or gangrene: Secondary | ICD-10-CM | POA: Insufficient documentation

## 2017-01-02 DIAGNOSIS — Z6837 Body mass index (BMI) 37.0-37.9, adult: Secondary | ICD-10-CM | POA: Insufficient documentation

## 2017-01-02 DIAGNOSIS — Z3042 Encounter for surveillance of injectable contraceptive: Secondary | ICD-10-CM | POA: Insufficient documentation

## 2017-01-02 DIAGNOSIS — Z3040 Encounter for surveillance of contraceptives, unspecified: Secondary | ICD-10-CM

## 2017-01-02 LAB — GLUCOSE, POCT (MANUAL RESULT ENTRY): POC Glucose: 304 mg/dl — AB (ref 70–99)

## 2017-01-02 MED ORDER — GABAPENTIN 300 MG PO CAPS: 300.0000 mg | ORAL_CAPSULE | Freq: Two times a day (BID) | ORAL | 3 refills | Status: DC

## 2017-01-02 MED ORDER — MEDROXYPROGESTERONE ACETATE 150 MG/ML IM SUSP
150.0000 mg | Freq: Once | INTRAMUSCULAR | Status: AC
  Administered 2017-01-02: 150 mg via INTRAMUSCULAR

## 2017-01-02 MED ORDER — METFORMIN HCL ER (MOD) 1000 MG PO TB24: 1000.0000 mg | ORAL_TABLET | Freq: Every day | ORAL | 3 refills | Status: DC

## 2017-01-02 NOTE — Discharge Instructions (Signed)
Go to ED

## 2017-01-02 NOTE — Progress Notes (Signed)
Sarah Doomlicia Going, is a 36 y.o. female  ZOX:[id number]  [id number]:[id number]  DOB - Sep 14, 1980  Chief Complaint  Patient presents with  . Establish Care       Subjective:   Sarah Garrison is a 36 y.o. female with a history of uncontrolled diabetes mellitus, hypertension, asthma, GERD, and umbilical hernia awaiting surgery presents here today for a routine follow up visit and Depo Provera injection. She also has FMLA paper work that she needs me to fill out for her to allow her some flexibilities of taking sick days from work. Her BS is uncontrolled partly because of non-adherence and poor insight to her overall medical conditions and co morbidities, and partly because of work related stress. 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 She told me her blood sugar will not be controlled as long as she continues her current schedule at work. She has occasional abdominal pains from her umbilical hernia, she has seen referred to general surgery for evaluation and management. Patient has No headache, No chest pain, No new weakness tingling or numbness, No Cough - SOB. She is currently taking insulin only and inconsistently, she does not take metformin because of "stomach upset", she is suppose to take glipizide but she does inconsistently.  Problem  Depot Contraception  Morbid Obesity, Unspecified Obesity Type (Hcc)    ALLERGIES: Allergies  Allergen Reactions  . Morphine And Related Shortness Of Breath  . Glyburide Diarrhea and Other (See Comments)    Reaction:  Nose bleeds   . Ivp Dye [Iodinated Diagnostic Agents]     Shortness of breath.    . Oxycodone Other (See Comments)    Pt states that this medication makes her "dream about rabbits chasing" her.    . Vicodin [Hydrocodone-Acetaminophen] Other (See Comments)    Pt states that this medication makes her "dream about rabbits chasing" her.      PAST MEDICAL HISTORY: Past Medical  History:  Diagnosis Date  . Asthma   . Diabetes mellitus without complication (HCC)   . GERD (gastroesophageal reflux disease)   . Hypertension     MEDICATIONS AT HOME: Prior to Admission medications   Medication Sig Start Date End Date Taking? Authorizing Provider  ACCU-CHEK AVIVA PLUS test strip  07/20/16   [provider]  ACCU-CHEK SOFTCLIX LANCETS lancets  07/20/16   [provider]  acetaminophen (TYLENOL) 500 MG tablet Take 1 tablet (500 mg total) by mouth every 6 (six) hours as needed. Patient not taking: Reported on 10/16/2016 09/03/16   Everlene Farrieransie, William, PA-C  butalbital-acetaminophen-caffeine (FIORICET, ESGIC) [phone] MG tablet Take 1-2 tablets by mouth every 6 (six) hours as needed for headache. Patient not taking: Reported on 10/16/2016 10/05/16 10/05/17  Trixie DredgeWest, Emily, PA-C  gabapentin (NEURONTIN) 300 MG capsule Take 1 capsule (300 mg total) by mouth 2 (two) times daily. 01/02/17   Quentin AngstJegede, Delma Drone E, MD  insulin aspart (NOVOLOG) 100 UNIT/ML FlexPen Inject 10 Units into the skin 3 (three) times daily with meals. Take with largest meal. 12/12/16   Newt LukesLeschber, Valerie A, MD  LANTUS SOLOSTAR 100 UNIT/ML Solostar Pen Inject 30 Units into the skin 2 (two) times daily. 12/12/16   Newt LukesLeschber, Valerie A, MD  lisinopril (PRINIVIL,ZESTRIL) 10 MG tablet Take 1 tablet (10 mg total) by mouth daily. 12/12/16   Newt LukesLeschber, Valerie A, MD  metFORMIN (GLUMETZA) 1000 MG (MOD) 24 hr tablet Take 1 tablet (1,000 mg total) by mouth daily with breakfast. 01/02/17  Quentin AngstJegede, Garland Hincapie E, MD  nystatin (MYCOSTATIN/NYSTOP) powder Apply topically 4 (four) times daily. 12/12/16   Newt LukesLeschber, Valerie A, MD  ondansetron (ZOFRAN) 4 MG tablet Take 1 tablet (4 mg total) by mouth every 6 (six) hours. 12/22/16   Hedges, Tinnie GensJeffrey, PA-C  TRUEPLUS PEN NEEDLES 32G X 4 MM MISC  07/27/16   [provider]    Objective:   Vitals:   01/02/17 1020  BP: 131/83  Pulse: 92  Resp: 16  Temp: 98.8 F (37.1 C)  TempSrc:  Oral  SpO2: 99%  Weight: 219 lb 12.8 oz (99.7 kg)   Exam General appearance : Awake, alert, not in any distress. Speech Clear. Not toxic looking, morbidly obese HEENT: Atraumatic and Normocephalic, pupils equally reactive to light and accomodation Neck: Supple, no JVD. No cervical lymphadenopathy.  Chest: Good air entry bilaterally, no added sounds  CVS: S1 S2 regular, no murmurs.  Abdomen: Bowel sounds present, Non tender and not distended with no gaurding, rigidity or rebound. Extremities: B/L Lower Ext shows no edema, both legs are warm to touch Neurology: Awake alert, and oriented X 3, CN II-XII intact, Non focal Skin: No Rash  Data Review Lab Results  Component Value Date   HGBA1C 12.7 12/13/2016   HGBA1C 12.6 08/16/2016   HGBA1C 9.0 01/10/2016    Assessment & Plan   1. Uncontrolled type 2 diabetes mellitus with complication, with long-term current use of insulin (HCC)  - POCT glucose (manual entry) - Restart - metFORMIN (GLUMETZA) 1000 MG (MOD) 24 hr tablet; Take 1 tablet (1,000 mg total) by mouth daily with breakfast.  Dispense: 90 tablet; Refill: 3 Continue - gabapentin (NEURONTIN) 300 MG capsule; Take 1 capsule (300 mg total) by mouth 2 (two) times daily.  Dispense: 180 capsule; Refill: 3  2. Morbid obesity, unspecified obesity type (HCC)  Aim for 30 minutes of exercise most days. Rethink what you drink. Water is great! Aim for 2-3 Carb Choices per meal (30-45 grams) +/- 1 either way  Aim for 0-15 Carbs per snack if hungry  Include protein in moderation with your meals and snacks  Consider reading food labels for Total Carbohydrate and Fat Grams of foods  Consider checking BG at alternate times per day  Continue taking medication as directed Be mindful about how much sugar you are adding to beverages and other foods. Fruit Punch - find one with no sugar  Measure and decrease portions of carbohydrate foods  Make your plate and don't go back for seconds  3.  Non-adherence to medical treatment  Patient counseled extensively about medication adherence and consequences of uncontrolled Diabetes was discussed extensively with patient, she verbalized understanding  4. Depot contraception  - Given today  Patient have been counseled extensively about nutrition and exercise. Other issues discussed during this visit include: low cholesterol diet, weight control and daily exercise, foot care, annual eye examinations at Ophthalmology, importance of adherence with medications and regular follow-up. We also discussed long term complications of uncontrolled diabetes and hypertension.   Return in about 3 months (around 04/04/2017) for Hemoglobin A1C and Follow up, DM, Pap Smear.  The patient was given clear instructions to go to ER or return to medical center if symptoms don't improve, worsen or new problems develop. The patient verbalized understanding. The patient was told to call to get lab results if they haven't heard anything in the next week.   This note has been created with Education officer, environmentalDragon speech recognition software and smart phrase technology. Any transcriptional errors  are unintentional.    Jeanann Lewandowsky, MD, MHA, Maxwell Caul, CPE [facility] [place], [place] [phone]   01/02/2017, 11:21 AM

## 2017-01-02 NOTE — ED Provider Notes (Signed)
CSN: [id number]     Arrival date & time 01/02/17  1654 History   First MD Initiated Contact with Patient 01/02/17 1747     Chief Complaint  Patient presents with  . Abdominal Pain   (Consider location/radiation/quality/duration/timing/severity/associated sxs/prior Treatment) Patient is here for complaints of abdominal pain and burning.  She states she has hx of umbilical and ventral hernia and is awaiting surgery.   The history is provided by the patient.  Abdominal Pain  Pain location:  Periumbilical Pain quality: aching and burning   Pain severity:  Severe Onset quality:  Sudden Duration:  6 hours Timing:  Constant Progression:  Worsening Chronicity:  New Relieved by:  Nothing   Past Medical History:  Diagnosis Date  . Asthma   . Diabetes mellitus without complication (HCC)   . GERD (gastroesophageal reflux disease)   . Hypertension    Past Surgical History:  Procedure Laterality Date  . CESAREAN SECTION    . TUBAL LIGATION    . WISDOM TOOTH EXTRACTION     Family History  Problem Relation Age of Onset  . Asthma Mother   . Asthma Father    Social History  Substance Use Topics  . Smoking status: Current Every Day Smoker    Packs/day: 0.10    Types: Cigarettes    Last attempt to quit: 11/02/2013  . Smokeless tobacco: Never Used  . Alcohol use No   OB History    Gravida Para Term Preterm AB Living   5 4     1 4    SAB TAB Ectopic Multiple Live Births   1             Review of Systems  Constitutional: Negative.   HENT: Negative.   Eyes: Negative.   Respiratory: Negative.   Cardiovascular: Negative.   Gastrointestinal: Positive for abdominal pain.  Endocrine: Negative.   Genitourinary: Negative.   Musculoskeletal: Negative.   Allergic/Immunologic: Negative.   Neurological: Negative.   Hematological: Negative.   Psychiatric/Behavioral: Negative.     Allergies  Morphine and related; Glyburide; Ivp dye [iodinated diagnostic agents]; Oxycodone; and  Vicodin [hydrocodone-acetaminophen]  Home Medications   Prior to Admission medications   Medication Sig Start Date End Date Taking? Authorizing Provider  ACCU-CHEK AVIVA PLUS test strip  07/20/16   [provider]  ACCU-CHEK SOFTCLIX LANCETS lancets  07/20/16   [provider]  gabapentin (NEURONTIN) 300 MG capsule Take 1 capsule (300 mg total) by mouth 2 (two) times daily. 01/02/17   Quentin AngstJegede, Olugbemiga E, MD  insulin aspart (NOVOLOG) 100 UNIT/ML FlexPen Inject 10 Units into the skin 3 (three) times daily with meals. Take with largest meal. 12/12/16   Newt LukesLeschber, Valerie A, MD  LANTUS SOLOSTAR 100 UNIT/ML Solostar Pen Inject 30 Units into the skin 2 (two) times daily. 12/12/16   Newt LukesLeschber, Valerie A, MD  lisinopril (PRINIVIL,ZESTRIL) 10 MG tablet Take 1 tablet (10 mg total) by mouth daily. 12/12/16   Newt LukesLeschber, Valerie A, MD  metFORMIN (GLUMETZA) 1000 MG (MOD) 24 hr tablet Take 1 tablet (1,000 mg total) by mouth daily with breakfast. 01/02/17   Quentin AngstJegede, Olugbemiga E, MD  nystatin (MYCOSTATIN/NYSTOP) powder Apply topically 4 (four) times daily. 12/12/16   Newt LukesLeschber, Valerie A, MD  ondansetron (ZOFRAN) 4 MG tablet Take 1 tablet (4 mg total) by mouth every 6 (six) hours. 12/22/16   Hedges, Tinnie GensJeffrey, PA-C  TRUEPLUS PEN NEEDLES 32G X 4 MM MISC  07/27/16   [provider]   Meds Ordered and Administered  this Visit  Medications - No data to display  BP 127/77 (BP Location: Right Arm)   Pulse (!) 107   Temp 98.9 F (37.2 C) (Oral)   Resp 20   SpO2 98%  No data found.   Physical Exam  Constitutional: She appears well-developed and well-nourished.  HENT:  Head: Normocephalic and atraumatic.  Eyes: Pupils are equal, round, and reactive to light. Conjunctivae and EOM are normal.  Neck: Normal range of motion. Neck supple.  Cardiovascular: Normal rate, regular rhythm and normal heart sounds.   Pulmonary/Chest: Effort normal and breath sounds normal.  Abdominal: There is tenderness.   Central mid abdomen with tenderness and guarding.  Ventral Hernia is palpated.  Nursing note and vitals reviewed.   Urgent Care Course     Procedures (including critical care time)  Labs Review Labs Reviewed - No data to display  Imaging Review No results found.   Visual Acuity Review  Right Eye Distance:   Left Eye Distance:   Bilateral Distance:    Right Eye Near:   Left Eye Near:    Bilateral Near:         MDM   1. Acute abdominal pain    Go to ED for higher level of care    Deatra CanterOxford, Sherelle Castelli J, FNP 01/02/17 1807

## 2017-01-02 NOTE — Patient Instructions (Addendum)
Make an nurse visit between October 17- October 31 for next depo injection   Diabetes Mellitus and Sick Day Management Blood sugar (glucose) can be difficult to control when you are sick. Common illnesses that can cause problems for people with diabetes (diabetes mellitus) include colds, fever, flu (influenza), nausea, vomiting, and diarrhea. These illnesses can cause stress and loss of body fluids (dehydration), and those issues can cause blood glucose levels to increase. Because of this, it is very important to take your insulin and diabetes medicines and eat some form of carbohydrate when you are sick. You should make a plan for days when you are sick (sick day plan) as part of your diabetes management plan. You and your health care provider should make this plan in advance. The following guidelines are intended to help you manage an illness that lasts for about 24 hours or less. Your health care provider may also give you more specific instructions. What do I need to do to manage my blood glucose?  Check your blood glucose every 2-4 hours, or as often as told by your health care provider.  Know your sick day treatment goals. Your target blood glucose levels may be different when you are sick.  If you use insulin, take your usual dose. ? If your blood glucose continues to be too high, you may need to take an additional insulin dose as told by your health care provider.  If you use oral diabetes medicine, you may need to stop taking it if you are not able to eat or drink normally. Ask your health care provider about whether you need to stop taking these medicines while you are sick.  If you use injectable hormone medicines other than insulin to control your diabetes, ask your health care provider about whether you need to stop taking these medicines while you are sick. What else can I do to manage my diabetes when I am sick? Check your ketones  If you have type 1 diabetes, check your urine  ketones every 4 hours.  If you have type 2 diabetes, check your urine ketones as often as told by your health care provider. Drink fluids  Drink enough fluid to keep your urine clear or pale yellow. This is especially important if you have a fever, vomiting, or diarrhea. Those symptoms can lead to dehydration.  Follow any instructions from your health care provider about beverages to avoid. ? Do not drink alcohol, caffeine, or drinks that contain a lot of sugar. Take medicines as directed  Take-over-the-counter and prescription medicines only as told by your health care provider.  Check medicine labels for added sugars. Some medicines may contain sugar or types of sugars that can raise your blood glucose level. What foods can I eat when I am sick? You need to eat some form of carbohydrates when you are sick. You should eat 45-50 grams (45-50 g) of carbohydrates every 3-4 hours until you feel better. All of the food choices below contain about 15 g of carbohydrates. Plan ahead and keep some of these foods around so you have them if you get sick.  4-6 oz (120-177 mL) carbonated beverage that contains sugar, such as regular (not diet) soda. You may be able to drink carbonated beverages more easily if you open the beverage and let it sit at room temperature for a few minutes before drinking.   of a twin frozen ice pop.  4 oz (120 g) regular gelatin.  4 oz (120 mL) fruit  juice.  4 oz (120 g) ice cream or frozen yogurt.  2 oz (60 g) sherbet.  8 oz (240 mL) clear broth or soup.  4 oz (120 g) regular custard.  4 oz (120 g) regular pudding.  8 oz (240 g) plain yogurt.  1 slice bread or toast.  6 saltine crackers.  5 vanilla wafers.  Questions to ask your health care provider Consider asking the following questions so you know what to do on days when you are sick:  Should I adjust my diabetes medicines?  How often do I need to check my blood glucose?  What supplies do I need  to manage my diabetes at home when I am sick?  What number can I call if I have questions?  What foods and drinks should I avoid?  Contact a health care provider if:  You develop symptoms of diabetic ketoacidosis, such as: ? Fatigue. ? Weight loss. ? Excessive thirst. ? Light-headedness. ? Fruity or sweet-smelling breath. ? Excessive urination. ? Vision changes. ? Confusion or irritability. ? Nausea. ? Vomiting. ? Rapid breathing. ? Pain in the abdomen. ? Feeling flushed.  You are unable to drink fluids without vomiting.  You have any of the following for more than 6 hours: ? Nausea. ? Vomiting. ? Diarrhea.  Your blood glucose is at or above 240 mg/dL (96.0 mmol/L), even after you take an additional insulin dose.  You have a change in how you think, feel, or act (mental status).  You develop another serious illness.  You have been sick or have had a fever for 2 days or longer and you are not getting better. Get help right away if:  Your blood glucose is lower than 54 mg/dL (3.0 mmol/L).  You have difficulty breathing.  You have moderate or high ketone levels in your urine.  You used emergency glucagon to treat low blood glucose. Summary  Blood sugar (glucose) can be difficult to control when you are sick. Common illnesses that can cause problems for people with diabetes (diabetes mellitus) include colds, fever, flu (influenza), nausea, vomiting, and diarrhea.  Illnesses can cause stress and loss of body fluids (dehydration), and those issues can cause blood glucose levels to increase.  Make a plan for days when you are sick (sick day plan) as part of your diabetes management plan. You and your health care provider should make this plan in advance.  It is very important to take your insulin and diabetes medicines and to eat some form of carbohydrate when you are sick.  Contact your health care provider if have problems managing your blood glucose levels when you  are sick, or if you have been sick or had a fever for 2 days or longer and are not getting better. This information is not intended to replace advice given to you by your health care provider. Make sure you discuss any questions you have with your health care provider. Document Released: 05/24/2003 Document Revised: 02/17/2016 Document Reviewed: 02/17/2016 Elsevier Interactive Patient Education  2018 [employer]. Diabetes Mellitus and Food It is important for you to manage your blood sugar (glucose) level. Your blood glucose level can be greatly affected by what you eat. Eating healthier foods in the appropriate amounts throughout the day at about the same time each day will help you control your blood glucose level. It can also help slow or prevent worsening of your diabetes mellitus. Healthy eating may even help you improve the level of your blood pressure and  reach or maintain a healthy weight. General recommendations for healthful eating and cooking habits include:  Eating meals and snacks regularly. Avoid going long periods of time without eating to lose weight.  Eating a diet that consists mainly of plant-based foods, such as fruits, vegetables, nuts, legumes, and whole grains.  Using low-heat cooking methods, such as baking, instead of high-heat cooking methods, such as deep frying.  Work with your dietitian to make sure you understand how to use the Nutrition Facts information on food labels. How can food affect me? Carbohydrates Carbohydrates affect your blood glucose level more than any other type of food. Your dietitian will help you determine how many carbohydrates to eat at each meal and teach you how to count carbohydrates. Counting carbohydrates is important to keep your blood glucose at a healthy level, especially if you are using insulin or taking certain medicines for diabetes mellitus. Alcohol Alcohol can cause sudden decreases in blood glucose (hypoglycemia), especially if  you use insulin or take certain medicines for diabetes mellitus. Hypoglycemia can be a life-threatening condition. Symptoms of hypoglycemia (sleepiness, dizziness, and disorientation) are similar to symptoms of having too much alcohol. If your health care provider has given you approval to drink alcohol, do so in moderation and use the following guidelines:  Women should not have more than one drink per day, and men should not have more than two drinks per day. One drink is equal to: ? 12 oz of beer. ? 5 oz of wine. ? 1 oz of hard liquor.  Do not drink on an empty stomach.  Keep yourself hydrated. Have water, diet soda, or unsweetened iced tea.  Regular soda, juice, and other mixers might contain a lot of carbohydrates and should be counted.  What foods are not recommended? As you make food choices, it is important to remember that all foods are not the same. Some foods have fewer nutrients per serving than other foods, even though they might have the same number of calories or carbohydrates. It is difficult to get your body what it needs when you eat foods with fewer nutrients. Examples of foods that you should avoid that are high in calories and carbohydrates but low in nutrients include:  Trans fats (most processed foods list trans fats on the Nutrition Facts label).  Regular soda.  Juice.  Candy.  Sweets, such as cake, pie, doughnuts, and cookies.  Fried foods.  What foods can I eat? Eat nutrient-rich foods, which will nourish your body and keep you healthy. The food you should eat also will depend on several factors, including:  The calories you need.  The medicines you take.  Your weight.  Your blood glucose level.  Your blood pressure level.  Your cholesterol level.  You should eat a variety of foods, including:  Protein. ? Lean cuts of meat. ? Proteins low in saturated fats, such as fish, egg whites, and beans. Avoid processed meats.  Fruits and  vegetables. ? Fruits and vegetables that may help control blood glucose levels, such as apples, mangoes, and yams.  Dairy products. ? Choose fat-free or low-fat dairy products, such as milk, yogurt, and cheese.  Grains, bread, pasta, and rice. ? Choose whole grain products, such as multigrain bread, whole oats, and brown rice. These foods may help control blood pressure.  Fats. ? Foods containing healthful fats, such as nuts, avocado, olive oil, canola oil, and fish.  Does everyone with diabetes mellitus have the same meal plan? Because every person with  diabetes mellitus is different, there is not one meal plan that works for everyone. It is very important that you meet with a dietitian who will help you create a meal plan that is just right for you. This information is not intended to replace advice given to you by your health care provider. Make sure you discuss any questions you have with your health care provider. Document Released: 02/15/2005 Document Revised: 10/27/2015 Document Reviewed: 04/17/2013 Elsevier Interactive Patient Education  2017 [employer] Diabetes Mellitus and Exercise Exercising regularly is important for your overall health, especially when you have diabetes (diabetes mellitus). Exercising is not only about losing weight. It has many health benefits, such as increasing muscle strength and bone density and reducing body fat and stress. This leads to improved fitness, flexibility, and endurance, all of which result in better overall health. Exercise has additional benefits for people with diabetes, including:  Reducing appetite.  Helping to lower and control blood glucose.  Lowering blood pressure.  Helping to control amounts of fatty substances (lipids) in the blood, such as cholesterol and triglycerides.  Helping the body to respond better to insulin (improving insulin sensitivity).  Reducing how much insulin the body needs.  Decreasing the risk for heart  disease by: ? Lowering cholesterol and triglyceride levels. ? Increasing the levels of good cholesterol. ? Lowering blood glucose levels.  What is my activity plan? Your health care provider or certified diabetes educator can help you make a plan for the type and frequency of exercise (activity plan) that works for you. Make sure that you:  Do at least 150 minutes of moderate-intensity or vigorous-intensity exercise each week. This could be brisk walking, biking, or water aerobics. ? Do stretching and strength exercises, such as yoga or weightlifting, at least 2 times a week. ? Spread out your activity over at least 3 days of the week.  Get some form of physical activity every day. ? Do not go more than 2 days in a row without some kind of physical activity. ? Avoid being inactive for more than 90 minutes at a time. Take frequent breaks to walk or stretch.  Choose a type of exercise or activity that you enjoy, and set realistic goals.  Start slowly, and gradually increase the intensity of your exercise over time.  What do I need to know about managing my diabetes?  Check your blood glucose before and after exercising. ? If your blood glucose is higher than 240 mg/dL (29.513.3 mmol/L) before you exercise, check your urine for ketones. If you have ketones in your urine, do not exercise until your blood glucose returns to normal.  Know the symptoms of low blood glucose (hypoglycemia) and how to treat it. Your risk for hypoglycemia increases during and after exercise. Common symptoms of hypoglycemia can include: ? Hunger. ? Anxiety. ? Sweating and feeling clammy. ? Confusion. ? Dizziness or feeling light-headed. ? Increased heart rate or palpitations. ? Blurry vision. ? Tingling or numbness around the mouth, lips, or tongue. ? Tremors or shakes. ? Irritability.  Keep a rapid-acting carbohydrate snack available before, during, and after exercise to help prevent or treat  hypoglycemia.  Avoid injecting insulin into areas of the body that are going to be exercised. For example, avoid injecting insulin into: ? The arms, when playing tennis. ? The legs, when jogging.  Keep records of your exercise habits. Doing this can help you and your health care provider adjust your diabetes management plan as needed. Write down: ?  Food that you eat before and after you exercise. ? Blood glucose levels before and after you exercise. ? The type and amount of exercise you have done. ? When your insulin is expected to peak, if you use insulin. Avoid exercising at times when your insulin is peaking.  When you start a new exercise or activity, work with your health care provider to make sure the activity is safe for you, and to adjust your insulin, medicines, or food intake as needed.  Drink plenty of water while you exercise to prevent dehydration or heat stroke. Drink enough fluid to keep your urine clear or pale yellow. This information is not intended to replace advice given to you by your health care provider. Make sure you discuss any questions you have with your health care provider. Document Released: 08/11/2003 Document Revised: 12/09/2015 Document Reviewed: 10/31/2015 Elsevier Interactive Patient Education  2018 [employer]. Blood Glucose Monitoring, Adult Monitoring your blood sugar (glucose) helps you manage your diabetes. It also helps you and your health care provider determine how well your diabetes management plan is working. Blood glucose monitoring involves checking your blood glucose as often as directed, and keeping a record (log) of your results over time. Why should I monitor my blood glucose? Checking your blood glucose regularly can:  Help you understand how food, exercise, illnesses, and medicines affect your blood glucose.  Let you know what your blood glucose is at any time. You can quickly tell if you are having low blood glucose (hypoglycemia) or  high blood glucose (hyperglycemia).  Help you and your health care provider adjust your medicines as needed.  When should I check my blood glucose? Follow instructions from your health care provider about how often to check your blood glucose. This may depend on:  The type of diabetes you have.  How well-controlled your diabetes is.  Medicines you are taking.  If you have type 1 diabetes:  Check your blood glucose at least 2 times a day.  Also check your blood glucose: ? Before every insulin injection. ? Before and after exercise. ? Between meals. ? 2 hours after a meal. ? Occasionally between 2:00 a.m. and 3:00 a.m., as directed. ? Before potentially dangerous tasks, like driving or using heavy machinery. ? At bedtime.  You may need to check your blood glucose more often, up to 6-10 times a day: ? If you use an insulin pump. ? If you need multiple daily injections (MDI). ? If your diabetes is not well-controlled. ? If you are ill. ? If you have a history of severe hypoglycemia. ? If you have a history of not knowing when your blood glucose is getting low (hypoglycemia unawareness). If you have type 2 diabetes:  If you take insulin or other diabetes medicines, check your blood glucose at least 2 times a day.  If you are on intensive insulin therapy, check your blood glucose at least 4 times a day. Occasionally, you may also need to check between 2:00 a.m. and 3:00 a.m., as directed.  Also check your blood glucose: ? Before and after exercise. ? Before potentially dangerous tasks, like driving or using heavy machinery.  You may need to check your blood glucose more often if: ? Your medicine is being adjusted. ? Your diabetes is not well-controlled. ? You are ill. What is a blood glucose log?  A blood glucose log is a record of your blood glucose readings. It helps you and your health care provider: ? Look for  patterns in your blood glucose over time. ? Adjust your  diabetes management plan as needed.  Every time you check your blood glucose, write down your result and notes about things that may be affecting your blood glucose, such as your diet and exercise for the day.  Most glucose meters store a record of glucose readings in the meter. Some meters allow you to download your records to a computer. How do I check my blood glucose? Follow these steps to get accurate readings of your blood glucose: Supplies needed   Blood glucose meter.  Test strips for your meter. Each meter has its own strips. You must use the strips that come with your meter.  A needle to prick your finger (lancet). Do not use lancets more than once.  A device that holds the lancet (lancing device).  A journal or log book to write down your results. Procedure  Wash your hands with soap and water.  Prick the side of your finger (not the tip) with the lancet. Use a different finger each time.  Gently rub the finger until a small drop of blood appears.  Follow instructions that come with your meter for inserting the test strip, applying blood to the strip, and using your blood glucose meter.  Write down your result and any notes. Alternative testing sites  Some meters allow you to use areas of your body other than your finger (alternative sites) to test your blood.  If you think you may have hypoglycemia, or if you have hypoglycemia unawareness, do not use alternative sites. Use your finger instead.  Alternative sites may not be as accurate as the fingers, because blood flow is slower in these areas. This means that the result you get may be delayed, and it may be different from the result that you would get from your finger.  The most common alternative sites are: ? Forearm. ? Thigh. ? Palm of the hand. Additional tips  Always keep your supplies with you.  If you have questions or need help, all blood glucose meters have a 24-hour "hotline" number that you can  call. You may also contact your health care provider.  After you use a few boxes of test strips, adjust (calibrate) your blood glucose meter by following instructions that came with your meter. This information is not intended to replace advice given to you by your health care provider. Make sure you discuss any questions you have with your health care provider. Document Released: 05/24/2003 Document Revised: 12/09/2015 Document Reviewed: 10/31/2015 Elsevier Interactive Patient Education  2017 [employer] Medroxyprogesterone injection [Contraceptive] What is this medicine? MEDROXYPROGESTERONE (me DROX ee proe JES te rone) contraceptive injections prevent pregnancy. They provide effective birth control for 3 months. Depo-subQ Provera 104 is also used for treating pain related to endometriosis. This medicine may be used for other purposes; ask your health care provider or pharmacist if you have questions. COMMON BRAND NAME(S): Depo-Provera, Depo-subQ Provera 104 What should I tell my health care provider before I take this medicine? They need to know if you have any of these conditions: -frequently drink alcohol -asthma -blood vessel disease or a history of a blood clot in the lungs or legs -bone disease such as osteoporosis -breast cancer -diabetes -eating disorder (anorexia nervosa or bulimia) -high blood pressure -HIV infection or AIDS -kidney disease -liver disease -mental depression -migraine -seizures (convulsions) -stroke -tobacco smoker -vaginal bleeding -an unusual or allergic reaction to medroxyprogesterone, other hormones, medicines, foods, dyes, or preservatives -pregnant  or trying to get pregnant -breast-feeding How should I use this medicine? Depo-Provera Contraceptive injection is given into a muscle. Depo-subQ Provera 104 injection is given under the skin. These injections are given by a health care professional. You must not be pregnant before getting an injection.  The injection is usually given during the first 5 days after the start of a menstrual period or 6 weeks after delivery of a baby. Talk to your pediatrician regarding the use of this medicine in children. Special care may be needed. These injections have been used in female children who have started having menstrual periods. Overdosage: If you think you have taken too much of this medicine contact a poison control center or emergency room at once. NOTE: This medicine is only for you. Do not share this medicine with others. What if I miss a dose? Try not to miss a dose. You must get an injection once every 3 months to maintain birth control. If you cannot keep an appointment, call and reschedule it. If you wait longer than 13 weeks between Depo-Provera contraceptive injections or longer than 14 weeks between Depo-subQ Provera 104 injections, you could get pregnant. Use another method for birth control if you miss your appointment. You may also need a pregnancy test before receiving another injection. What may interact with this medicine? Do not take this medicine with any of the following medications: -bosentan This medicine may also interact with the following medications: -aminoglutethimide -antibiotics or medicines for infections, especially rifampin, rifabutin, rifapentine, and griseofulvin -aprepitant -barbiturate medicines such as phenobarbital or primidone -bexarotene -carbamazepine -medicines for seizures like ethotoin, felbamate, oxcarbazepine, phenytoin, topiramate -modafinil -St. John's wort This list may not describe all possible interactions. Give your health care provider a list of all the medicines, herbs, non-prescription drugs, or dietary supplements you use. Also tell them if you smoke, drink alcohol, or use illegal drugs. Some items may interact with your medicine. What should I watch for while using this medicine? This drug does not protect you against HIV infection (AIDS) or  other sexually transmitted diseases. Use of this product may cause you to lose calcium from your bones. Loss of calcium may cause weak bones (osteoporosis). Only use this product for more than 2 years if other forms of birth control are not right for you. The longer you use this product for birth control the more likely you will be at risk for weak bones. Ask your health care professional how you can keep strong bones. You may have a change in bleeding pattern or irregular periods. Many females stop having periods while taking this drug. If you have received your injections on time, your chance of being pregnant is very low. If you think you may be pregnant, see your health care professional as soon as possible. Tell your health care professional if you want to get pregnant within the next year. The effect of this medicine may last a long time after you get your last injection. What side effects may I notice from receiving this medicine? Side effects that you should report to your doctor or health care professional as soon as possible: -allergic reactions like skin rash, itching or hives, swelling of the face, lips, or tongue -breast tenderness or discharge -breathing problems -changes in vision -depression -feeling faint or lightheaded, falls -fever -pain in the abdomen, chest, groin, or leg -problems with balance, talking, walking -unusually weak or tired -yellowing of the eyes or skin Side effects that usually do not require medical attention (  report to your doctor or health care professional if they continue or are bothersome): -acne -fluid retention and swelling -headache -irregular periods, spotting, or absent periods -temporary pain, itching, or skin reaction at site where injected -weight gain This list may not describe all possible side effects. Call your doctor for medical advice about side effects. You may report side effects to FDA at 1-800-FDA-1088. Where should I keep my  medicine? This does not apply. The injection will be given to you by a health care professional. NOTE: This sheet is a summary. It may not cover all possible information. If you have questions about this medicine, talk to your doctor, pharmacist, or health care provider.  2018 Elsevier/Gold Standard (2008-06-11 18:37:56)

## 2017-01-02 NOTE — ED Triage Notes (Signed)
[facility] a [facility] of abdominal pain and burning that started after getting her Depo-Provera shot today.

## 2017-01-02 NOTE — Telephone Encounter (Signed)
Pt. Came tot the office to drop some paper for FMLA, paper will be at the pcp inbox

## 2017-01-06 ENCOUNTER — Encounter (HOSPITAL_COMMUNITY): Payer: Self-pay | Admitting: Emergency Medicine

## 2017-01-06 DIAGNOSIS — Z79899 Other long term (current) drug therapy: Secondary | ICD-10-CM | POA: Insufficient documentation

## 2017-01-06 DIAGNOSIS — E119 Type 2 diabetes mellitus without complications: Secondary | ICD-10-CM | POA: Insufficient documentation

## 2017-01-06 DIAGNOSIS — Z794 Long term (current) use of insulin: Secondary | ICD-10-CM | POA: Insufficient documentation

## 2017-01-06 DIAGNOSIS — I10 Essential (primary) hypertension: Secondary | ICD-10-CM | POA: Insufficient documentation

## 2017-01-06 DIAGNOSIS — F17210 Nicotine dependence, cigarettes, uncomplicated: Secondary | ICD-10-CM | POA: Insufficient documentation

## 2017-01-06 NOTE — ED Triage Notes (Signed)
Pt reports having headache for the last 3-4 days when not getting enough rest. Pt states that she is getting 2-3 hr of sleep and her employer wanted to have blood pressure check.

## 2017-01-07 ENCOUNTER — Emergency Department (HOSPITAL_COMMUNITY)
Admission: EM | Admit: 2017-01-07 | Discharge: 2017-01-07 | Disposition: A | Discharge status: Home / Self Care | Payer: Medicaid Other | Attending: Emergency Medicine | Admitting: Emergency Medicine

## 2017-01-07 DIAGNOSIS — I10 Essential (primary) hypertension: Secondary | ICD-10-CM

## 2017-01-07 NOTE — ED Provider Notes (Signed)
[facility]-EMERGENCY DEPT Provider Note   CSN: [id number] Arrival date & time: 01/06/17  2152     History   Chief Complaint Chief Complaint  Patient presents with  . Headache    HPI Sarah Garrison is a 36 y.o. female.  HPI   36 year old female presents today with complaints of hypertension. Patient reports that she was at work when she was sent to the emergency room for evaluation because her blood pressure was elevated. Patient notes at work she was having headache which is not abnormal for her. She describes this as frontal typical headache she experiences when she is at work or under stress. She notes that the headache has completely resolved, she had no associated neurological deficits, neck stiffness, fever, trauma, or any other red flags. Patient notes she's had trouble managing her blood pressure him and notes taking lisinopril as directed. Patient reports she was sent here for a work note, she has no other complaints including chest pain shortness of breath, abdominal pain, or any other concerning signs or symptoms.     Past Medical History:  Diagnosis Date  . Asthma   . Diabetes mellitus without complication (HCC)   . GERD (gastroesophageal reflux disease)   . Hypertension     Patient Active Problem List   Diagnosis Date Noted  . Uncontrolled type 2 diabetes mellitus with complication, with long-term current use of insulin (HCC) 12/13/2016  . Non-adherence to medical treatment 12/13/2016  . Asthma 07/30/2016  . 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/07/2016  . Encounter for pregnancy test 02/01/2016  . Leg swelling 09/29/2015  . Morbid obesity, unspecified obesity type (HCC) 09/29/2015  . DM neuropathy, type II diabetes mellitus (HCC) 08/24/2015    Past Surgical History:  Procedure Laterality Date  . CESAREAN SECTION    . TUBAL LIGATION    . WISDOM TOOTH EXTRACTION      OB History    Gravida Para Term Preterm AB Living   5 4     1 4    SAB TAB Ectopic Multiple Live Births     1               Home Medications    Prior to Admission medications   Medication Sig Start Date End Date Taking? Authorizing Provider  ACCU-CHEK AVIVA PLUS test strip  07/20/16   [provider]  ACCU-CHEK SOFTCLIX LANCETS lancets  07/20/16   [provider]  gabapentin (NEURONTIN) 300 MG capsule Take 1 capsule (300 mg total) by mouth 2 (two) times daily. 01/02/17   Quentin AngstJegede, Olugbemiga E, MD  insulin aspart (NOVOLOG) 100 UNIT/ML FlexPen Inject 10 Units into the skin 3 (three) times daily with meals. Take with largest meal. 12/12/16   Newt LukesLeschber, Valerie A, MD  LANTUS SOLOSTAR 100 UNIT/ML Solostar Pen Inject 30 Units into the skin 2 (two) times daily. 12/12/16   Newt LukesLeschber, Valerie A, MD  lisinopril (PRINIVIL,ZESTRIL) 10 MG tablet Take 1 tablet (10 mg total) by mouth daily. 12/12/16   Newt LukesLeschber, Valerie A, MD  metFORMIN (GLUMETZA) 1000 MG (MOD) 24 hr tablet Take 1 tablet (1,000 mg total) by mouth daily with breakfast. 01/02/17   Quentin AngstJegede, Olugbemiga E, MD  nystatin (MYCOSTATIN/NYSTOP) powder Apply topically 4 (four) times daily. 12/12/16   Newt LukesLeschber, Valerie A, MD  ondansetron (ZOFRAN) 4 MG tablet Take 1 tablet (4 mg total) by mouth every 6 (six) hours. 12/22/16   Tayson Schnelle, Tinnie GensJeffrey, PA-C  TRUEPLUS PEN NEEDLES 32G X 4 MM MISC  07/27/16   [provider]  Family History Family History  Problem Relation Age of Onset  . Asthma Mother   . Asthma Father     Social History Social History  Substance Use Topics  . Smoking status: Current Every Day Smoker    Packs/day: 0.10    Types: Cigarettes    Last attempt to quit: 11/02/2013  . Smokeless tobacco: Never Used  . Alcohol use No     Allergies   Morphine and related; Glyburide; Ivp dye [iodinated diagnostic agents]; Oxycodone; and Vicodin [hydrocodone-acetaminophen]   Review of Systems Review of Systems  All other systems reviewed and are negative.    Physical Exam Updated Vital Signs BP (!) 160/99 (BP Location: Right  Arm)   Pulse (!) 110   Temp 98.5 F (36.9 C) (Oral)   Resp 18   Ht 5\' 4"  (1.626 m)   Wt 98.3 kg (216 lb 11.2 oz)   SpO2 100%   BMI 37.20 kg/m   Physical Exam  Constitutional: She is oriented to person, place, and time. She appears well-developed and well-nourished.  HENT:  Head: Normocephalic and atraumatic.  Eyes: Pupils are equal, round, and reactive to light. Conjunctivae are normal. Right eye exhibits no discharge. Left eye exhibits no discharge. No scleral icterus.  Neck: Normal range of motion. No JVD present. No tracheal deviation present.  Cardiovascular: Normal rate, regular rhythm, normal heart sounds and intact distal pulses.  Exam reveals no gallop and no friction rub.   No murmur heard. Pulmonary/Chest: Effort normal and breath sounds normal. No stridor. No respiratory distress. She has no wheezes. She has no rales. She exhibits no tenderness.  Neurological: She is alert and oriented to person, place, and time. She has normal strength. No cranial nerve deficit or sensory deficit. Coordination normal. GCS eye subscore is 4. GCS verbal subscore is 5. GCS motor subscore is 6.  Psychiatric: She has a normal mood and affect. Her behavior is normal. Judgment and thought content normal.  Nursing note and vitals reviewed.    ED Treatments / Results  Labs (all labs ordered are listed, but only abnormal results are displayed) Labs Reviewed - No data to display  EKG  EKG Interpretation None       Radiology No results found.  Procedures Procedures (including critical care time)  Medications Ordered in ED Medications - No data to display   Initial Impression / Assessment and Plan / ED Course  I have reviewed the triage vital signs and the nursing notes.  Pertinent labs & imaging results that were available during my care of the patient were reviewed by me and considered in my medical decision making (see chart for details).       Final Clinical Impressions(s)  / ED Diagnoses   Final diagnoses:  Essential hypertension    Assessment/Plan: Patient presents for evaluation of her blood pressure. She is showing signs of hypertension, no physical signs of this. This is uncomplicated, she needs no further evaluation or management here in the ED. She is instructed to follow-up with her primary care for ongoing evaluation of uncontrolled hypertension. She is given strict return precautions, she verbalized understanding and agreement to today's plan had no further questions or concerns at time of discharge.   New Prescriptions New Prescriptions   No medications on file     Eyvonne Mechanic, Cordelia Poche 01/07/17 0018    Shon Baton, MD 01/07/17 [phone]

## 2017-01-07 NOTE — Discharge Instructions (Signed)
Please read attached information. If you experience any new or worsening signs or symptoms please return to the emergency room for evaluation. Please follow-up with your primary care provider or specialist as discussed. Please continue using high blood pressure medication as directed.

## 2017-01-07 NOTE — ED Notes (Signed)
Bed: WA06 Expected date:  Expected time:  Means of arrival:  Comments: 

## 2017-01-08 ENCOUNTER — Ambulatory Visit: Payer: Medicaid Other | Admitting: Pharmacist

## 2017-01-08 NOTE — Progress Notes (Deleted)
    S:     No chief complaint on file.   Patient arrives ***.  Presents for diabetes evaluation, education, and management [facility]. Jegede.. Patient was referred on 12/13/16.  Patient was last seen by Primary Care Provider on 01/02/17.   Patient {Actions; denies-reports:120008} adherence with medications.  Current diabetes medications include: Lantus 30 units daily, Novolog 10 units TID, and metformin 1000 mg BID. Current hypertension medications include: Lisinopril 10 mg daily.   Patient {Actions; denies-reports:120008} hypoglycemic events.  Patient reported dietary habits: Eats *** meals/day Breakfast:*** Lunch:*** Dinner:*** Snacks:*** Drinks:***  Patient reported exercise habits:    Patient {Actions; denies-reports:120008} nocturia.  Patient {Actions; denies-reports:120008} neuropathy. Patient {Actions; denies-reports:120008} visual changes. Patient {Actions; denies-reports:120008} self foot exams.   [email or web address] [email or web address]   O:  Physical Exam   ROS   Lab Results  Component Value Date   HGBA1C 12.7 12/13/2016   There were no vitals filed for this visit.  Home fasting CBG: ***  2 hour post-prandial/random CBG: ***.  10 year ASCVD risk: ***.  A/P: Diabetes longstanding currently uncontrolled based on A1c of 12.7. Patient {Actions; denies-reports:120008} hypoglycemic events and is able to verbalize appropriate hypoglycemia management plan. Patient {Actions; denies-reports:120008} adherence with medication. Control is suboptimal due to dietary indiscretion, sedentary lifestyle, and nonadherence to medications.    Next A1C anticipated October 2018.    Hypertension longstanding currently ***.  Patient {Actions; denies-reports:120008} adherence with medication. Control is suboptimal due to ***.  Written patient instructions provided.  Total time in face to face counseling *** minutes.   Follow up in Pharmacist Clinic Visit ***.   Patient seen  with Vickey HugerPreetika Sharma, PharmD Candidate

## 2017-01-10 NOTE — Telephone Encounter (Signed)
Will route to nurse °

## 2017-01-16 ENCOUNTER — Ambulatory Visit: Payer: Self-pay | Attending: Internal Medicine | Admitting: Internal Medicine

## 2017-01-16 VITALS — BP 113/76 | HR 102 | Temp 98.9°F | Resp 18 | Ht 64.0 in | Wt 218.0 lb

## 2017-01-16 DIAGNOSIS — I10 Essential (primary) hypertension: Secondary | ICD-10-CM | POA: Insufficient documentation

## 2017-01-16 DIAGNOSIS — Z794 Long term (current) use of insulin: Secondary | ICD-10-CM | POA: Insufficient documentation

## 2017-01-16 DIAGNOSIS — Z6837 Body mass index (BMI) 37.0-37.9, adult: Secondary | ICD-10-CM | POA: Insufficient documentation

## 2017-01-16 DIAGNOSIS — E1165 Type 2 diabetes mellitus with hyperglycemia: Secondary | ICD-10-CM | POA: Insufficient documentation

## 2017-01-16 DIAGNOSIS — Z566 Other physical and mental strain related to work: Secondary | ICD-10-CM | POA: Insufficient documentation

## 2017-01-16 DIAGNOSIS — IMO0002 Reserved for concepts with insufficient information to code with codable children: Secondary | ICD-10-CM

## 2017-01-16 DIAGNOSIS — Z885 Allergy status to narcotic agent status: Secondary | ICD-10-CM | POA: Insufficient documentation

## 2017-01-16 DIAGNOSIS — Z91041 Radiographic dye allergy status: Secondary | ICD-10-CM | POA: Insufficient documentation

## 2017-01-16 DIAGNOSIS — E118 Type 2 diabetes mellitus with unspecified complications: Secondary | ICD-10-CM

## 2017-01-16 DIAGNOSIS — Z888 Allergy status to other drugs, medicaments and biological substances status: Secondary | ICD-10-CM | POA: Insufficient documentation

## 2017-01-16 LAB — GLUCOSE, POCT (MANUAL RESULT ENTRY): POC Glucose: 307 mg/dl — AB (ref 70–99)

## 2017-01-16 NOTE — Patient Instructions (Addendum)
° °Blood Glucose Monitoring, Adult °Monitoring your blood sugar (glucose) helps you manage your diabetes. It also helps you and your health care provider determine how well your diabetes management plan is working. Blood glucose monitoring involves checking your blood glucose as often as directed, and keeping a record (log) of your results over time. °Why should I monitor my blood glucose? °Checking your blood glucose regularly can: °· Help you understand how food, exercise, illnesses, and medicines affect your blood glucose. °· Let you know what your blood glucose is at any time. You can quickly tell if you are having low blood glucose (hypoglycemia) or high blood glucose (hyperglycemia). °· Help you and your health care provider adjust your medicines as needed. °When should I check my blood glucose? °Follow instructions from your health care provider about how often to check your blood glucose. This may depend on: °· The type of diabetes you have. °· How well-controlled your diabetes is. °· Medicines you are taking. °If you have type 1 diabetes:  °· Check your blood glucose at least 2 times a day. °· Also check your blood glucose: °¨ Before every insulin injection. °¨ Before and after exercise. °¨ Between meals. °¨ 2 hours after a meal. °¨ Occasionally between 2:00 a.m. and 3:00 a.m., as directed. °¨ Before potentially dangerous tasks, like driving or using heavy machinery. °¨ At bedtime. °· You may need to check your blood glucose more often, up to 6-10 times a day: °¨ If you use an insulin pump. °¨ If you need multiple daily injections (MDI). °¨ If your diabetes is not well-controlled. °¨ If you are ill. °¨ If you have a history of severe hypoglycemia. °¨ If you have a history of not knowing when your blood glucose is getting low (hypoglycemia unawareness). °If you have type 2 diabetes:  °· If you take insulin or other diabetes medicines, check your blood glucose at least 2 times a day. °· If you are on  intensive insulin therapy, check your blood glucose at least 4 times a day. Occasionally, you may also need to check between 2:00 a.m. and 3:00 a.m., as directed. °· Also check your blood glucose: °¨ Before and after exercise. °¨ Before potentially dangerous tasks, like driving or using heavy machinery. °· You may need to check your blood glucose more often if: °¨ Your medicine is being adjusted. °¨ Your diabetes is not well-controlled. °¨ You are ill. °What is a blood glucose log? °· A blood glucose log is a record of your blood glucose readings. It helps you and your health care provider: °¨ Look for patterns in your blood glucose over time. °¨ Adjust your diabetes management plan as needed. °· Every time you check your blood glucose, write down your result and notes about things that may be affecting your blood glucose, such as your diet and exercise for the day. °· Most glucose meters store a record of glucose readings in the meter. Some meters allow you to download your records to a computer. °How do I check my blood glucose? °Follow these steps to get accurate readings of your blood glucose: °Supplies needed  ° °· Blood glucose meter. °· Test strips for your meter. Each meter has its own strips. You must use the strips that come with your meter. °· A needle to prick your finger (lancet). Do not use lancets more than once. °· A device that holds the lancet (lancing device). °· A journal or log book to write down your results. °  Procedure  °· Wash your hands with soap and water. °· Prick the side of your finger (not the tip) with the lancet. Use a different finger each time. °· Gently rub the finger until a small drop of blood appears. °· Follow instructions that come with your meter for inserting the test strip, applying blood to the strip, and using your blood glucose meter. °· Write down your result and any notes. °Alternative testing sites  °· Some meters allow you to use areas of your body other than your  finger (alternative sites) to test your blood. °· If you think you may have hypoglycemia, or if you have hypoglycemia unawareness, do not use alternative sites. Use your finger instead. °· Alternative sites may not be as accurate as the fingers, because blood flow is slower in these areas. This means that the result you get may be delayed, and it may be different from the result that you would get from your finger. °· The most common alternative sites are: °¨ Forearm. °¨ Thigh. °¨ Palm of the hand. °Additional tips  °· Always keep your supplies with you. °· If you have questions or need help, all blood glucose meters have a 24-hour “hotline” number that you can call. You may also contact your health care provider. °· After you use a few boxes of test strips, adjust (calibrate) your blood glucose meter by following instructions that came with your meter. °This information is not intended to replace advice given to you by your health care provider. Make sure you discuss any questions you have with your health care provider. °Document Released: 05/24/2003 Document Revised: 12/09/2015 Document Reviewed: 10/31/2015 °Elsevier Interactive Patient Education © 2017 Elsevier Inc. ° ° °Type 2 Diabetes Mellitus, Diagnosis, Adult °Type 2 diabetes (type 2 diabetes mellitus) is a long-term (chronic) disease. In type 2 diabetes, one or both of these problems may be present: °· The pancreas does not make enough of a hormone called insulin. °· Cells in the body do not respond properly to insulin that the body makes (insulin resistance). °Normally, insulin allows blood sugar (glucose) to enter cells in the body. The cells use glucose for energy. Insulin resistance or lack of insulin causes excess glucose to build up in the blood instead of going into cells. As a result, high blood glucose (hyperglycemia) develops. °What increases the risk? °The following factors may make you more likely to develop type 2 diabetes: °· Having a family  member with type 2 diabetes. °· Being overweight or obese. °· Having an inactive (sedentary) lifestyle. °· Having been diagnosed with insulin resistance. °· Having a history of prediabetes, gestational diabetes, or polycystic ovarian syndrome (PCOS). °· Being of American-Indian, African-American, Hispanic/Latino, or Asian/Pacific Islander descent. °What are the signs or symptoms? °In the early stage of this condition, you may not have symptoms. Symptoms develop slowly and may include: °· Increased thirst (polydipsia). °· Increased hunger (polyphagia). °· Increased urination (polyuria). °· Increased urination during the night (nocturia). °· Unexplained weight loss. °· Frequent infections that keep coming back (recurring). °· Fatigue. °· Weakness. °· Vision changes, such as blurry vision. °· Cuts or bruises that are slow to heal. °· Tingling or numbness in the hands or feet. °· Dark patches on the skin (acanthosis nigricans). °How is this diagnosed? °  °This condition is diagnosed based on your symptoms, your medical history, a physical exam, and your blood glucose level. Your blood glucose may be checked with one or more of the following blood tests: °· A   fasting blood glucose (FBG) test. You will not be allowed to eat (you will fast) for at least 8 hours before a blood sample is taken. °· A random blood glucose test. This checks blood glucose at any time of day regardless of when you ate. °· An A1c (hemoglobin A1c) blood test. This provides information about blood glucose control over the previous 2-3 months. °· An oral glucose tolerance test (OGTT). This measures your blood glucose at two times: °¨ After fasting. This is your baseline blood glucose level. °¨ Two hours after drinking a beverage that contains glucose. °You may be diagnosed with type 2 diabetes if: °· Your FBG level is 126 mg/dL (7.0 mmol/L) or higher. °· Your random blood glucose level is 200 mg/dL (11.1 mmol/L) or higher. °· Your A1c level is 6.5%  or higher. °· Your OGGT result is higher than 200 mg/dL (11.1 mmol/L). °These blood tests may be repeated to confirm your diagnosis. °How is this treated? °  °Your treatment may be managed by a specialist called an endocrinologist. Type 2 diabetes may be treated by following instructions from your health care provider about: °· Making diet and lifestyle changes. This may include: °¨ Following an individualized nutrition plan that is developed by a diet and nutrition specialist (registered dietitian). °¨ Exercising regularly. °¨ Finding ways to manage stress. °· Checking your blood glucose level as often as directed. °· Taking diabetes medicines or insulin daily. This helps to keep your blood glucose levels in the healthy range. °¨ If you use insulin, you may need to adjust the dosage depending on how physically active you are and what foods you eat. Your health care provider will tell you how to adjust your dosage. °· Taking medicines to help prevent complications from diabetes, such as: °¨ Aspirin. °¨ Medicine to lower cholesterol. °¨ Medicine to control blood pressure. °Your health care provider will set individualized treatment goals for you. Your goals will be based on your age, other medical conditions you have, and how you respond to diabetes treatment. Generally, the goal of treatment is to maintain the following blood glucose levels: °· Before meals (preprandial): 80-130 mg/dL (4.4-7.2 mmol/L). °· After meals (postprandial): below 180 mg/dL (10 mmol/L). °· A1c level: less than 7%. °Follow these instructions at home: °Questions to Ask Your Health Care Provider  °Consider asking the following questions: °· Do I need to meet with a diabetes educator? °· Where can I find a support group for people with diabetes? °· What equipment will I need to manage my diabetes at home? °· What diabetes medicines do I need, and when should I take them? °· How often do I need to check my blood glucose? °· What number can I call  if I have questions? °· When is my next appointment? °General instructions  °· Take over-the-counter and prescription medicines only as told by your health care provider. °· Keep all follow-up visits as told by your health care provider. This is important. °· For more information about diabetes, visit: °¨ American Diabetes Association (ADA): www.diabetes.org °¨ American Association of Diabetes Educators (AADE): www.diabeteseducator.org/patient-resources °Contact a health care provider if: °· Your blood glucose is at or above 240 mg/dL (13.3 mmol/L) for 2 days in a row. °· You have been sick or have had a fever for 2 days or longer and you are not getting better. °· You have any of the following problems for more than 6 hours: °¨ You cannot eat or drink. °¨ You have nausea   and vomiting. °¨ You have diarrhea. °Get help right away if: °· Your blood glucose is lower than 54 mg/dL (3.0 mmol/L). °· You become confused or you have trouble thinking clearly. °· You have difficulty breathing. °· You have moderate or large ketone levels in your urine. °This information is not intended to replace advice given to you by your health care provider. Make sure you discuss any questions you have with your health care provider. °Document Released: 05/21/2005 Document Revised: 10/27/2015 Document Reviewed: 06/24/2015 °Elsevier Interactive Patient Education © 2017 Elsevier Inc. ° °

## 2017-01-16 NOTE — Progress Notes (Signed)
Sarah Garrison, is a 36 y.o. female  ZOX:[id number]  WJX:[id number]  DOB - [birth year]  Chief Complaint  Patient presents with  . Diabetes       Subjective:   Sarah Garrison is a 36 y.o. female with a history of uncontrolled diabetes mellitus, hypertension, asthma, GERD, and umbilical hernia awaiting surgery here today for a follow up visit. She says she is stressed out at work due to ? Hard labor and non-flexible schedule. She is tearful today, denies suicidal thoughts. Blood sugar still not adequately controlled. She told me her blood sugar will not be controlled as long as she continues her current schedule at work. She has occasional abdominal pains from her umbilical hernia, she has seen referred to general surgery for evaluation and management. Patient has No headache, No chest pain, No abdominal pain - No Nausea, No new weakness tingling or numbness, No Cough - SOB. She wants her FMLA paperwork filled out to give her some flexibilities and opportunity to schedule office visit with her PCP.  No problems updated.  ALLERGIES: Allergies  Allergen Reactions  . Morphine And Related Shortness Of Breath  . Glyburide Diarrhea and Other (See Comments)    Reaction:  Nose bleeds   . Ivp Dye [Iodinated Diagnostic Agents]     Shortness of breath.    . Oxycodone Other (See Comments)    Pt states that this medication makes her "dream about rabbits chasing" her.    . Vicodin [Hydrocodone-Acetaminophen] Other (See Comments)    Pt states that this medication makes her "dream about rabbits chasing" her.      PAST MEDICAL HISTORY: Past Medical History:  Diagnosis Date  . Asthma   . Diabetes mellitus without complication (HCC)   . GERD (gastroesophageal reflux disease)   . Hypertension     MEDICATIONS AT HOME: Prior to Admission medications   Medication Sig Start Date End Date Taking? Authorizing Provider  ACCU-CHEK AVIVA PLUS test strip  07/20/16   [provider]  ACCU-CHEK  SOFTCLIX LANCETS lancets  07/20/16   [provider]  gabapentin (NEURONTIN) 300 MG capsule Take 1 capsule (300 mg total) by mouth 2 (two) times daily. 01/02/17   Quentin AngstJegede, Aaliyha Mumford E, MD  insulin aspart (NOVOLOG) 100 UNIT/ML FlexPen Inject 10 Units into the skin 3 (three) times daily with meals. Take with largest meal. 12/12/16   Newt LukesLeschber, Valerie A, MD  LANTUS SOLOSTAR 100 UNIT/ML Solostar Pen Inject 30 Units into the skin 2 (two) times daily. 12/12/16   Newt LukesLeschber, Valerie A, MD  lisinopril (PRINIVIL,ZESTRIL) 10 MG tablet Take 1 tablet (10 mg total) by mouth daily. 12/12/16   Newt LukesLeschber, Valerie A, MD  metFORMIN (GLUMETZA) 1000 MG (MOD) 24 hr tablet Take 1 tablet (1,000 mg total) by mouth daily with breakfast. 01/02/17   Quentin AngstJegede, Vayda Dungee E, MD  nystatin (MYCOSTATIN/NYSTOP) powder Apply topically 4 (four) times daily. 12/12/16   Newt LukesLeschber, Valerie A, MD  ondansetron (ZOFRAN) 4 MG tablet Take 1 tablet (4 mg total) by mouth every 6 (six) hours. 12/22/16   Hedges, Tinnie GensJeffrey, PA-C  TRUEPLUS PEN NEEDLES 32G X 4 MM MISC  07/27/16   [provider]    Objective:   Vitals:   01/16/17 0933  BP: 113/76  Pulse: (!) 102  Resp: 18  Temp: 98.9 F (37.2 C)  TempSrc: Oral  SpO2: 98%  Weight: 218 lb (98.9 kg)  Height: 5\' 4"  (1.626 m)   Exam General appearance : Awake, alert, not in any distress. Speech Clear.  Not toxic looking, morbidly obese, tearful HEENT: Atraumatic and Normocephalic, pupils equally reactive to light and accomodation Neck: Supple, no JVD. No cervical lymphadenopathy.  Chest: Good air entry bilaterally, no added sounds  CVS: S1 S2 regular, no murmurs.  Abdomen: Bowel sounds present, Non tender and not distended with no gaurding, rigidity or rebound. Extremities: B/L Lower Ext shows no edema, both legs are warm to touch Neurology: Awake alert, and oriented X 3, CN II-XII intact, Non focal Skin: No Rash  Data Review Lab Results  Component Value Date   HGBA1C 12.7 12/13/2016    HGBA1C 12.6 08/16/2016   HGBA1C 9.0 01/10/2016    Assessment & Plan   1. Uncontrolled type 2 diabetes mellitus with complication, with long-term current use of insulin (HCC)  - Glucose (CBG)  2. Morbid obesity, unspecified obesity type (HCC)  Aim for 30 minutes of exercise most days. Rethink what you drink. Water is great! Aim for 2-3 Carb Choices per meal (30-45 grams) +/- 1 either way  Aim for 0-15 Carbs per snack if hungry  Include protein in moderation with your meals and snacks  Consider reading food labels for Total Carbohydrate and Fat Grams of foods  Consider checking BG at alternate times per day  Continue taking medication as directed Be mindful about how much sugar you are adding to beverages and other foods. Fruit Punch - find one with no sugar  Measure and decrease portions of carbohydrate foods  Make your plate and don't go back for seconds  3. Work-related stress  - FMLA paper work filled out - Excused from work today - Patient may need to take at least one day off per week to recuperate from disease burden  Patient have been counseled extensively about nutrition and exercise. Other issues discussed during this visit include: low cholesterol diet, weight control and daily exercise, foot care, annual eye examinations at Ophthalmology, importance of adherence with medications and regular follow-up. We also discussed long term complications of uncontrolled diabetes and hypertension.   Return in about 4 weeks (around 02/13/2017) for Hemoglobin A1C and Follow up, DM, Follow up Pain and comorbidities.  The patient was given clear instructions to go to ER or return to medical center if symptoms don't improve, worsen or new problems develop. The patient verbalized understanding. The patient was told to call to get lab results if they haven't heard anything in the next week.   This note has been created with Personnel officer. Any transcriptional errors are unintentional.    Jeanann Lewandowsky, MD, MHA, Maxwell Caul, CPE [facility] [place], [place] [phone]   01/16/2017, 10:57 AM

## 2017-01-19 ENCOUNTER — Encounter (HOSPITAL_COMMUNITY): Payer: Self-pay | Admitting: *Deleted

## 2017-01-19 ENCOUNTER — Ambulatory Visit (HOSPITAL_COMMUNITY)
Admission: EM | Admit: 2017-01-19 | Discharge: 2017-01-19 | Disposition: A | Discharge status: Home / Self Care | Payer: Medicaid Other | Attending: Family | Admitting: Family

## 2017-01-19 DIAGNOSIS — M791 Myalgia: Secondary | ICD-10-CM

## 2017-01-19 DIAGNOSIS — E1140 Type 2 diabetes mellitus with diabetic neuropathy, unspecified: Secondary | ICD-10-CM | POA: Insufficient documentation

## 2017-01-19 DIAGNOSIS — Z794 Long term (current) use of insulin: Secondary | ICD-10-CM | POA: Insufficient documentation

## 2017-01-19 DIAGNOSIS — R6889 Other general symptoms and signs: Secondary | ICD-10-CM

## 2017-01-19 DIAGNOSIS — I10 Essential (primary) hypertension: Secondary | ICD-10-CM | POA: Insufficient documentation

## 2017-01-19 DIAGNOSIS — J45909 Unspecified asthma, uncomplicated: Secondary | ICD-10-CM | POA: Insufficient documentation

## 2017-01-19 DIAGNOSIS — R739 Hyperglycemia, unspecified: Secondary | ICD-10-CM

## 2017-01-19 DIAGNOSIS — R51 Headache: Secondary | ICD-10-CM | POA: Insufficient documentation

## 2017-01-19 DIAGNOSIS — F17210 Nicotine dependence, cigarettes, uncomplicated: Secondary | ICD-10-CM | POA: Insufficient documentation

## 2017-01-19 DIAGNOSIS — R6883 Chills (without fever): Secondary | ICD-10-CM | POA: Insufficient documentation

## 2017-01-19 DIAGNOSIS — M7989 Other specified soft tissue disorders: Secondary | ICD-10-CM | POA: Insufficient documentation

## 2017-01-19 DIAGNOSIS — E1165 Type 2 diabetes mellitus with hyperglycemia: Secondary | ICD-10-CM | POA: Insufficient documentation

## 2017-01-19 DIAGNOSIS — R52 Pain, unspecified: Secondary | ICD-10-CM | POA: Insufficient documentation

## 2017-01-19 DIAGNOSIS — K219 Gastro-esophageal reflux disease without esophagitis: Secondary | ICD-10-CM | POA: Insufficient documentation

## 2017-01-19 LAB — POCT I-STAT, CHEM 8
BUN: 10 mg/dL (ref 6–20)
CALCIUM ION: 1.19 mmol/L (ref 1.15–1.40)
CHLORIDE: 104 mmol/L (ref 101–111)
CREATININE: 0.4 mg/dL — AB (ref 0.44–1.00)
Glucose, Bld: 328 mg/dL — ABNORMAL HIGH (ref 65–99)
HCT: 44 % (ref 36.0–46.0)
Hemoglobin: 15 g/dL (ref 12.0–15.0)
Potassium: 4 mmol/L (ref 3.5–5.1)
SODIUM: 138 mmol/L (ref 135–145)
TCO2: 22 mmol/L (ref 0–100)

## 2017-01-19 LAB — CBC
HEMATOCRIT: 41.6 % (ref 36.0–46.0)
HEMOGLOBIN: 14.4 g/dL (ref 12.0–15.0)
MCH: 29.9 pg (ref 26.0–34.0)
MCHC: 34.6 g/dL (ref 30.0–36.0)
MCV: 86.3 fL (ref 78.0–100.0)
Platelets: 310 10*3/uL (ref 150–400)
RBC: 4.82 MIL/uL (ref 3.87–5.11)
RDW: 12.1 % (ref 11.5–15.5)
WBC: 7.8 10*3/uL (ref 4.0–10.5)

## 2017-01-19 NOTE — ED Triage Notes (Addendum)
Patient reports 2 day history of generalized body aches, chills, and headache. Denies sore throat or ear pain. Patient took ibuprofen and naproxen to help with pain.

## 2017-01-19 NOTE — ED Provider Notes (Signed)
[facility]    CSN: [id number] Arrival date & time: 01/19/17  1830     History   Chief Complaint Chief Complaint  Patient presents with  . Headache  . Generalized Body Aches    HPI Sarah Garrison is a 36 y.o. female.   Chief complaint of headache, chills, bodyaches on 2 days, worsening. HA 'comes and goes', 'dull ache.'  No cough,  changes in vision, exertional chest pain or pressure, numbness or tingling radiating to left arm or jaw, palpitations, dizziness, frequent headaches, changes in vision, or shortness of breath.   No sick contacts  H/o HTN, DM.   Reports fasting sugars 'better today,  300 , usually in 500's'.             Past Medical History:  Diagnosis Date  . Asthma   . Diabetes mellitus without complication (HCC)   . GERD (gastroesophageal reflux disease)   . Hypertension     Patient Active Problem List   Diagnosis Date Noted  . Uncontrolled type 2 diabetes mellitus with complication, with long-term current use of insulin (HCC) 12/13/2016  . Non-adherence to medical treatment 12/13/2016  . Asthma 07/30/2016  . 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/07/2016  . Encounter for pregnancy test 02/01/2016  . Leg swelling 09/29/2015  . Morbid obesity, unspecified obesity type (HCC) 09/29/2015  . DM neuropathy, type II diabetes mellitus (HCC) 08/24/2015    Past Surgical History:  Procedure Laterality Date  . CESAREAN SECTION    . TUBAL LIGATION    . WISDOM TOOTH EXTRACTION      OB History    Gravida Para Term Preterm AB Living   5 4     1 4    SAB TAB Ectopic Multiple Live Births   1               Home Medications    Prior to Admission medications   Medication Sig Start Date End Date Taking? Authorizing Provider  ACCU-CHEK AVIVA PLUS test strip  07/20/16   [provider]  ACCU-CHEK SOFTCLIX LANCETS lancets  07/20/16   [provider]  gabapentin (NEURONTIN) 300 MG capsule Take 1 capsule (300 mg total) by mouth 2 (two)  times daily. 01/02/17   Quentin Angst, MD  insulin aspart (NOVOLOG) 100 UNIT/ML FlexPen Inject 10 Units into the skin 3 (three) times daily with meals. Take with largest meal. 12/12/16   Newt Lukes, MD  LANTUS SOLOSTAR 100 UNIT/ML Solostar Pen Inject 30 Units into the skin 2 (two) times daily. 12/12/16   Newt Lukes, MD  lisinopril (PRINIVIL,ZESTRIL) 10 MG tablet Take 1 tablet (10 mg total) by mouth daily. 12/12/16   Newt Lukes, MD  metFORMIN (GLUMETZA) 1000 MG (MOD) 24 hr tablet Take 1 tablet (1,000 mg total) by mouth daily with breakfast. 01/02/17   Quentin Angst, MD  nystatin (MYCOSTATIN/NYSTOP) powder Apply topically 4 (four) times daily. 12/12/16   Newt Lukes, MD  ondansetron (ZOFRAN) 4 MG tablet Take 1 tablet (4 mg total) by mouth every 6 (six) hours. 12/22/16   Hedges, Tinnie Gens, PA-C  TRUEPLUS PEN NEEDLES 32G X 4 MM MISC  07/27/16   [provider]    Family History Family History  Problem Relation Age of Onset  . Asthma Mother   . Asthma Father     Social History Social History  Substance Use Topics  . Smoking status: Current Every Day Smoker    Packs/day: 0.10    Types: Cigarettes  Last attempt to quit: 11/02/2013  . Smokeless tobacco: Never Used  . Alcohol use No     Allergies   Morphine and related; Glyburide; Ivp dye [iodinated diagnostic agents]; Oxycodone; and Vicodin [hydrocodone-acetaminophen]   Review of Systems Review of Systems  Constitutional: Positive for chills. Negative for fever.  Respiratory: Negative for cough.   Cardiovascular: Negative for chest pain and palpitations.  Gastrointestinal: Negative for nausea and vomiting.  Musculoskeletal: Positive for myalgias.  Neurological: Positive for headaches.     Physical Exam Triage Vital Signs ED Triage Vitals  Enc Vitals Group     BP 01/19/17 2009 (!) 130/98     Pulse Rate 01/19/17 2009 100     Resp 01/19/17 2009 18     Temp 01/19/17 2009 98.7 F  (37.1 C)     Temp Source 01/19/17 2009 Oral     SpO2 01/19/17 2009 100 %     Weight --      Height --      Head Circumference --      Peak Flow --      Pain Score 01/19/17 2008 10     Pain Loc --      Pain Edu? --      Excl. in GC? --    No data found.   Updated Vital Signs BP (!) 130/98 (BP Location: Right Arm)   Pulse 100   Temp 98.7 F (37.1 C) (Oral)   Resp 18   SpO2 100%   Visual Acuity Right Eye Distance:   Left Eye Distance:   Bilateral Distance:    Right Eye Near:   Left Eye Near:    Bilateral Near:     Physical Exam  Constitutional: She appears well-developed and well-nourished.  HENT:  Head: Normocephalic and atraumatic.  Right Ear: Hearing, tympanic membrane, external ear and ear canal normal. No swelling or tenderness. Tympanic membrane is not erythematous and not bulging. No middle ear effusion.  Left Ear: Tympanic membrane, external ear and ear canal normal. No swelling or tenderness. Tympanic membrane is not erythematous and not bulging.  No middle ear effusion.  Nose: Nose normal. No rhinorrhea. Right sinus exhibits no maxillary sinus tenderness and no frontal sinus tenderness. Left sinus exhibits no maxillary sinus tenderness and no frontal sinus tenderness.  Mouth/Throat: Uvula is midline, oropharynx is clear and moist and mucous membranes are normal. No posterior oropharyngeal edema or posterior oropharyngeal erythema.  Eyes: Pupils are equal, round, and reactive to light. Conjunctivae, EOM and lids are normal. Lids are everted and swept, no foreign bodies found.  Normal fundus bilaterally   Neck: Normal range of motion and full passive range of motion without pain. Neck supple. No Brudzinski's sign and no Kernig's sign noted.  Cardiovascular: Normal rate, regular rhythm, normal heart sounds and normal pulses.   Pulmonary/Chest: Effort normal and breath sounds normal. She has no wheezes. She has no rhonchi. She has no rales.  Lymphadenopathy:        Head (right side): No submental, no submandibular, no tonsillar, no preauricular, no posterior auricular and no occipital adenopathy present.       Head (left side): No submental, no submandibular, no tonsillar, no preauricular, no posterior auricular and no occipital adenopathy present.    She has no cervical adenopathy.       Right cervical: No superficial cervical, no deep cervical and no posterior cervical adenopathy present.      Left cervical: No superficial cervical, no deep cervical and no  posterior cervical adenopathy present.  Neurological: She is alert. She has normal strength. No cranial nerve deficit or sensory deficit. She displays a negative Romberg sign.  Reflex Scores:      Bicep reflexes are 2+ on the right side and 2+ on the left side.      Patellar reflexes are 2+ on the right side and 2+ on the left side. Grip equal and strong bilateral upper extremities. Gait strong and steady. Able to perform  finger-to-nose without difficulty.   Skin: Skin is warm and dry.  Psychiatric: She has a normal mood and affect. Her speech is normal and behavior is normal. Thought content normal.  Vitals reviewed.    UC Treatments / Results  Labs (all labs ordered are listed, but only abnormal results are displayed) Labs Reviewed  POCT I-STAT, CHEM 8 - Abnormal; Notable for the following:       Result Value   Creatinine, Ser 0.40 (*)    Glucose, Bld 328 (*)    All other components within normal limits  CBC    EKG  EKG Interpretation None       Radiology No results found.  Procedures Procedures (including critical care time)  Medications Ordered in UC Medications - No data to display   Initial Impression / Assessment and Plan / UC Course  I have reviewed the triage vital signs and the nursing notes.  Pertinent labs & imaging results that were available during my care of the patient were reviewed by me and considered in my medical decision making (see chart for  details).      Final Clinical Impressions(s) / UC Diagnoses   Final diagnoses:  Hyperglycemia  Flu-like symptoms   Patient is afebrile. No leukocytosis. Normal platelets.She is talkative, well appearing  in the exam room. She is not any acute distress. I'm reassured by her normal neurologic and HEENT exam. Explained to patient that the way her symptoms came on so abruptly, likely viral illness. I also explained her blood sugars are very very high, I have suspicion a lot of these general symptoms likely related to her blood sugar. I'm reassured as her electrolytes are normal at this time and advised to continue regimen as directed by PCP and to ensure proper follow-up primary care provider to get blood sugars under control. For myalgias, recommendrf patient take ibuprofen or Tylenol. Advised to remain very vigilant for any new or worsening symptoms and return for prompt evaluation.   New Prescriptions New Prescriptions   No medications on file     Controlled Substance Prescriptions [place] Controlled Substance Registry consulted? Not Applicable   Allegra Grana, [facility] 01/19/17 2129

## 2017-01-19 NOTE — Discharge Instructions (Signed)
As discussed, your illness upports a viral etiology. As also discussed, your blood sugar are very very high and this will often manifests as general malaise and not feeling well. It is very important that you maintain close follow with primary care provider in order control your blood sugar. For body aches, please take ibuprofen or Tylenol.  If there is no improvement in your symptoms, or if there is any worsening of symptoms, or if you have any additional concerns, please return for re-evaluation; or, if we are closed, consider going to the Emergency Room for evaluation if symptoms urgent.

## 2017-02-11 ENCOUNTER — Encounter: Payer: Self-pay | Admitting: Internal Medicine

## 2017-02-11 ENCOUNTER — Ambulatory Visit: Payer: Self-pay | Attending: Internal Medicine | Admitting: Internal Medicine

## 2017-02-11 VITALS — BP 133/93 | HR 101 | Temp 98.6°F | Resp 20 | Wt 219.4 lb

## 2017-02-11 DIAGNOSIS — E118 Type 2 diabetes mellitus with unspecified complications: Secondary | ICD-10-CM

## 2017-02-11 DIAGNOSIS — I10 Essential (primary) hypertension: Secondary | ICD-10-CM | POA: Insufficient documentation

## 2017-02-11 DIAGNOSIS — K219 Gastro-esophageal reflux disease without esophagitis: Secondary | ICD-10-CM | POA: Insufficient documentation

## 2017-02-11 DIAGNOSIS — Z794 Long term (current) use of insulin: Secondary | ICD-10-CM | POA: Insufficient documentation

## 2017-02-11 DIAGNOSIS — E1165 Type 2 diabetes mellitus with hyperglycemia: Secondary | ICD-10-CM | POA: Insufficient documentation

## 2017-02-11 DIAGNOSIS — IMO0002 Reserved for concepts with insufficient information to code with codable children: Secondary | ICD-10-CM

## 2017-02-11 LAB — GLUCOSE, POCT (MANUAL RESULT ENTRY): POC GLUCOSE: 224 mg/dL — AB (ref 70–99)

## 2017-02-11 LAB — POCT GLYCOSYLATED HEMOGLOBIN (HGB A1C): HEMOGLOBIN A1C: 11.5

## 2017-02-11 MED ORDER — TRUEPLUS PEN NEEDLES 32G X 4 MM MISC: 5 refills | Status: DC

## 2017-02-11 MED ORDER — ACCU-CHEK SOFTCLIX LANCETS MISC: 12 refills | Status: DC

## 2017-02-11 MED ORDER — ACCU-CHEK AVIVA PLUS VI STRP: ORAL_STRIP | 12 refills | Status: DC

## 2017-02-11 NOTE — Progress Notes (Signed)
Sarah Garrison, is a 36 y.o. female  ZOX:[id number]  WJX:[id number]  DOB - 1981-04-09  No chief complaint on file.     Subjective:   Sarah Garrison is a 36 y.o. female with a history of uncontrolled diabetes mellitus, hypertension, asthma, GERD, and umbilical hernia awaiting surgery here today for a routine follow up visit and diabetes supplies refill. She has no new complaint except for her ongoing umbilical hernia related occasional pain. She is tired this morning because she is coming from a third shift. She has started to exercise. HbA1C was 12.7% 3 months ago. She denies any blurry vision, no leg ulcer, no nausea or vomiting, no abdominal distension, no constipation. Patient has No headache, No chest pain, No new weakness tingling or numbness, No Cough - SOB.  No problems updated.  ALLERGIES: Allergies  Allergen Reactions  . Morphine And Related Shortness Of Breath  . Glyburide Diarrhea and Other (See Comments)    Reaction:  Nose bleeds   . Ivp Dye [Iodinated Diagnostic Agents]     Shortness of breath.    . Oxycodone Other (See Comments)    Pt states that this medication makes her "dream about rabbits chasing" her.    . Vicodin [Hydrocodone-Acetaminophen] Other (See Comments)    Pt states that this medication makes her "dream about rabbits chasing" her.      PAST MEDICAL HISTORY: Past Medical History:  Diagnosis Date  . Asthma   . Diabetes mellitus without complication (HCC)   . GERD (gastroesophageal reflux disease)   . Hypertension     MEDICATIONS AT HOME: Prior to Admission medications   Medication Sig Start Date End Date Taking? Authorizing Provider  ACCU-CHEK AVIVA PLUS test strip Test 3x per day 02/11/17  Yes Quentin Angst, MD  ACCU-CHEK SOFTCLIX LANCETS lancets Test 3x per day 02/11/17  Yes Loida Calamia E, MD  gabapentin (NEURONTIN) 300 MG capsule Take 1 capsule (300 mg total) by mouth 2 (two) times daily. 01/02/17  Yes Addilee Neu E, MD    insulin aspart (NOVOLOG) 100 UNIT/ML FlexPen Inject 10 Units into the skin 3 (three) times daily with meals. Take with largest meal. 12/12/16  Yes Newt Lukes, MD  LANTUS SOLOSTAR 100 UNIT/ML Solostar Pen Inject 30 Units into the skin 2 (two) times daily. 12/12/16  Yes Newt Lukes, MD  lisinopril (PRINIVIL,ZESTRIL) 10 MG tablet Take 1 tablet (10 mg total) by mouth daily. 12/12/16  Yes Newt Lukes, MD  metFORMIN (GLUMETZA) 1000 MG (MOD) 24 hr tablet Take 1 tablet (1,000 mg total) by mouth daily with breakfast. 01/02/17  Yes Brenen Beigel E, MD  TRUEPLUS PEN NEEDLES 32G X 4 MM MISC Test 3x per day 02/11/17  Yes Dontrell Stuck E, MD  nystatin (MYCOSTATIN/NYSTOP) powder Apply topically 4 (four) times daily. Patient not taking: Reported on 02/11/2017 12/12/16   Newt Lukes, MD  ondansetron (ZOFRAN) 4 MG tablet Take 1 tablet (4 mg total) by mouth every 6 (six) hours. Patient not taking: Reported on 02/11/2017 12/22/16   Eyvonne Mechanic, PA-C    Objective:   Vitals:   02/11/17 0843  BP: (!) 133/93  Pulse: (!) 101  Resp: 20  Temp: 98.6 F (37 C)  SpO2: 100%  Weight: 219 lb 6.4 oz (99.5 kg)   Exam General appearance : Awake, alert, not in any distress. Speech Clear. Not toxic looking, obese HEENT: Atraumatic and Normocephalic, pupils equally reactive to light and accomodation Neck: Supple, no JVD. No cervical lymphadenopathy.  Chest: Good air entry bilaterally, no added sounds  CVS: S1 S2 regular, no murmurs.  Abdomen: Bowel sounds present, Non tender and not distended with no gaurding, rigidity or rebound. Extremities: B/L Lower Ext shows no edema, both legs are warm to touch Neurology: Awake alert, and oriented X 3, CN II-XII intact, Non focal Skin: No Rash  Data Review Lab Results  Component Value Date   HGBA1C 12.7 12/13/2016   HGBA1C 12.6 08/16/2016   HGBA1C 9.0 01/10/2016    Assessment & Plan   1. Uncontrolled type 2 diabetes mellitus with  complication, with long-term current use of insulin (HCC)  - Glucose (CBG) - HgB A1c is 11.5% today, down from 12.7% - ACCU-CHEK SOFTCLIX LANCETS lancets; Test 3x per day  Dispense: 100 each; Refill: 12 - TRUEPLUS PEN NEEDLES 32G X 4 MM MISC; Test 3x per day  Dispense: 100 each; Refill: 5 - ACCU-CHEK AVIVA PLUS test strip; Test 3x per day  Dispense: 100 each; Refill: 12 - Ambulatory referral to Ophthalmology - Ambulatory referral to Podiatry  2. Morbid obesity, unspecified obesity type (HCC) Aim for 30 minutes of exercise most days. Rethink what you drink. Water is great! Aim for 2-3 Carb Choices per meal (30-45 grams) +/- 1 either way  Aim for 0-15 Carbs per snack if hungry  Include protein in moderation with your meals and snacks  Consider reading food labels for Total Carbohydrate and Fat Grams of foods  Consider checking BG at alternate times per day  Continue taking medication as directed Be mindful about how much sugar you are adding to beverages and other foods. Fruit Punch - find one with no sugar  Measure and decrease portions of carbohydrate foods  Make your plate and don't go back for seconds  Patient have been counseled extensively about nutrition and exercise. Other issues discussed during this visit include: low cholesterol diet, weight control and daily exercise, foot care, annual eye examinations at Ophthalmology, importance of adherence with medications and regular follow-up. We also discussed long term complications of uncontrolled diabetes and hypertension.   Return in about 3 months (around 05/13/2017) for Hemoglobin A1C and Follow up, DM.  The patient was given clear instructions to go to ER or return to medical center if symptoms don't improve, worsen or new problems develop. The patient verbalized understanding. The patient was told to call to get lab results if they haven't heard anything in the next week.   This note has been created with Engineer, agriculturalDragon speech  recognition software and smart phrase technology. Any transcriptional errors are unintentional.    Jeanann LewandowskyJEGEDE, Dat Derksen, MD, MHA, Maxwell CaulFACP, FAAP, CPE [facility] [place], [place] [phone]   02/11/2017, 9:16 AM

## 2017-02-11 NOTE — Progress Notes (Signed)
224

## 2017-02-11 NOTE — Patient Instructions (Signed)
Diabetes Mellitus and Exercise Exercising regularly is important for your overall health, especially when you have diabetes (diabetes mellitus). Exercising is not only about losing weight. It has many health benefits, such as increasing muscle strength and bone density and reducing body fat and stress. This leads to improved fitness, flexibility, and endurance, all of which result in better overall health. Exercise has additional benefits for people with diabetes, including:  Reducing appetite.  Helping to lower and control blood glucose.  Lowering blood pressure.  Helping to control amounts of fatty substances (lipids) in the blood, such as cholesterol and triglycerides.  Helping the body to respond better to insulin (improving insulin sensitivity).  Reducing how much insulin the body needs.  Decreasing the risk for heart disease by: ? Lowering cholesterol and triglyceride levels. ? Increasing the levels of good cholesterol. ? Lowering blood glucose levels.  What is my activity plan? Your health care provider or certified diabetes educator can help you make a plan for the type and frequency of exercise (activity plan) that works for you. Make sure that you:  Do at least 150 minutes of moderate-intensity or vigorous-intensity exercise each week. This could be brisk walking, biking, or water aerobics. ? Do stretching and strength exercises, such as yoga or weightlifting, at least 2 times a week. ? Spread out your activity over at least 3 days of the week.  Get some form of physical activity every day. ? Do not go more than 2 days in a row without some kind of physical activity. ? Avoid being inactive for more than 90 minutes at a time. Take frequent breaks to walk or stretch.  Choose a type of exercise or activity that you enjoy, and set realistic goals.  Start slowly, and gradually increase the intensity of your exercise over time.  What do I need to know about managing my  diabetes?  Check your blood glucose before and after exercising. ? If your blood glucose is higher than 240 mg/dL (13.3 mmol/L) before you exercise, check your urine for ketones. If you have ketones in your urine, do not exercise until your blood glucose returns to normal.  Know the symptoms of low blood glucose (hypoglycemia) and how to treat it. Your risk for hypoglycemia increases during and after exercise. Common symptoms of hypoglycemia can include: ? Hunger. ? Anxiety. ? Sweating and feeling clammy. ? Confusion. ? Dizziness or feeling light-headed. ? Increased heart rate or palpitations. ? Blurry vision. ? Tingling or numbness around the mouth, lips, or tongue. ? Tremors or shakes. ? Irritability.  Keep a rapid-acting carbohydrate snack available before, during, and after exercise to help prevent or treat hypoglycemia.  Avoid injecting insulin into areas of the body that are going to be exercised. For example, avoid injecting insulin into: ? The arms, when playing tennis. ? The legs, when jogging.  Keep records of your exercise habits. Doing this can help you and your health care provider adjust your diabetes management plan as needed. Write down: ? Food that you eat before and after you exercise. ? Blood glucose levels before and after you exercise. ? The type and amount of exercise you have done. ? When your insulin is expected to peak, if you use insulin. Avoid exercising at times when your insulin is peaking.  When you start a new exercise or activity, work with your health care provider to make sure the activity is safe for you, and to adjust your insulin, medicines, or food intake as needed.    Drink plenty of water while you exercise to prevent dehydration or heat stroke. Drink enough fluid to keep your urine clear or pale yellow. This information is not intended to replace advice given to you by your health care provider. Make sure you discuss any questions you have with  your health care provider. Document Released: 08/11/2003 Document Revised: 12/09/2015 Document Reviewed: 10/31/2015 Elsevier Interactive Patient Education  2018 [employer] Blood Glucose Monitoring, Adult Monitoring your blood sugar (glucose) helps you manage your diabetes. It also helps you and your health care provider determine how well your diabetes management plan is working. Blood glucose monitoring involves checking your blood glucose as often as directed, and keeping a record (log) of your results over time. Why should I monitor my blood glucose? Checking your blood glucose regularly can:  Help you understand how food, exercise, illnesses, and medicines affect your blood glucose.  Let you know what your blood glucose is at any time. You can quickly tell if you are having low blood glucose (hypoglycemia) or high blood glucose (hyperglycemia).  Help you and your health care provider adjust your medicines as needed.  When should I check my blood glucose? Follow instructions from your health care provider about how often to check your blood glucose. This may depend on:  The type of diabetes you have.  How well-controlled your diabetes is.  Medicines you are taking.  If you have type 1 diabetes:  Check your blood glucose at least 2 times a day.  Also check your blood glucose: ? Before every insulin injection. ? Before and after exercise. ? Between meals. ? 2 hours after a meal. ? Occasionally between 2:00 a.m. and 3:00 a.m., as directed. ? Before potentially dangerous tasks, like driving or using heavy machinery. ? At bedtime.  You may need to check your blood glucose more often, up to 6-10 times a day: ? If you use an insulin pump. ? If you need multiple daily injections (MDI). ? If your diabetes is not well-controlled. ? If you are ill. ? If you have a history of severe hypoglycemia. ? If you have a history of not knowing when your blood glucose is getting low  (hypoglycemia unawareness). If you have type 2 diabetes:  If you take insulin or other diabetes medicines, check your blood glucose at least 2 times a day.  If you are on intensive insulin therapy, check your blood glucose at least 4 times a day. Occasionally, you may also need to check between 2:00 a.m. and 3:00 a.m., as directed.  Also check your blood glucose: ? Before and after exercise. ? Before potentially dangerous tasks, like driving or using heavy machinery.  You may need to check your blood glucose more often if: ? Your medicine is being adjusted. ? Your diabetes is not well-controlled. ? You are ill. What is a blood glucose log?  A blood glucose log is a record of your blood glucose readings. It helps you and your health care provider: ? Look for patterns in your blood glucose over time. ? Adjust your diabetes management plan as needed.  Every time you check your blood glucose, write down your result and notes about things that may be affecting your blood glucose, such as your diet and exercise for the day.  Most glucose meters store a record of glucose readings in the meter. Some meters allow you to download your records to a computer. How do I check my blood glucose? Follow these steps to get accurate   readings of your blood glucose: Supplies needed   Blood glucose meter.  Test strips for your meter. Each meter has its own strips. You must use the strips that come with your meter.  A needle to prick your finger (lancet). Do not use lancets more than once.  A device that holds the lancet (lancing device).  A journal or log book to write down your results. Procedure  Wash your hands with soap and water.  Prick the side of your finger (not the tip) with the lancet. Use a different finger each time.  Gently rub the finger until a small drop of blood appears.  Follow instructions that come with your meter for inserting the test strip, applying blood to the strip,  and using your blood glucose meter.  Write down your result and any notes. Alternative testing sites  Some meters allow you to use areas of your body other than your finger (alternative sites) to test your blood.  If you think you may have hypoglycemia, or if you have hypoglycemia unawareness, do not use alternative sites. Use your finger instead.  Alternative sites may not be as accurate as the fingers, because blood flow is slower in these areas. This means that the result you get may be delayed, and it may be different from the result that you would get from your finger.  The most common alternative sites are: ? Forearm. ? Thigh. ? Palm of the hand. Additional tips  Always keep your supplies with you.  If you have questions or need help, all blood glucose meters have a 24-hour "hotline" number that you can call. You may also contact your health care provider.  After you use a few boxes of test strips, adjust (calibrate) your blood glucose meter by following instructions that came with your meter. This information is not intended to replace advice given to you by your health care provider. Make sure you discuss any questions you have with your health care provider. Document Released: 05/24/2003 Document Revised: 12/09/2015 Document Reviewed: 10/31/2015 Elsevier Interactive Patient Education  2017 [employer]  

## 2017-02-13 ENCOUNTER — Ambulatory Visit: Payer: Medicaid Other | Admitting: Internal Medicine

## 2017-03-02 NOTE — ED Provider Notes (Signed)
[facility]    CSN: [id number] Arrival date & time: 09/27/16  1831     History   Chief Complaint Chief Complaint  Patient presents with  . Foot Pain    HPI Sarah Garrison is a 36 y.o. female.   Pt c/o R ankle and toe pain after stubbing bare feet on concrete. She was able to walk immediately after incident.       Past Medical History:  Diagnosis Date  . Asthma   . Diabetes mellitus without complication (HCC)   . GERD (gastroesophageal reflux disease)   . Hypertension     Patient Active Problem Garrison   Diagnosis Date Noted  . Uncontrolled type 2 diabetes mellitus with complication, with long-term current use of insulin (HCC) 12/13/2016  . Non-adherence to medical treatment 12/13/2016  . Asthma 07/30/2016  . 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/07/2016  . Encounter for pregnancy test 02/01/2016  . Leg swelling 09/29/2015  . Morbid obesity, unspecified obesity type (HCC) 09/29/2015  . DM neuropathy, type II diabetes mellitus (HCC) 08/24/2015    Past Surgical History:  Procedure Laterality Date  . CESAREAN SECTION    . TUBAL LIGATION    . WISDOM TOOTH EXTRACTION      OB History    Gravida Para Term Preterm AB Living   SAB TAB Ectopic Multiple Live Births   1               Home Medications    Prior to Admission medications   Medication Sig Start Date End Date Taking? Authorizing Provider  ACCU-CHEK AVIVA PLUS test strip Test 3x per day 02/11/17   Quentin Angst, MD  ACCU-CHEK SOFTCLIX LANCETS lancets Test 3x per day 02/11/17   Quentin Angst, MD  gabapentin (NEURONTIN) 300 MG capsule Take 1 capsule (300 mg total) by mouth 2 (two) times daily. 01/02/17   Quentin Angst, MD  insulin aspart (NOVOLOG) 100 UNIT/ML FlexPen Inject 10 Units into the skin 3 (three) times daily with meals. Take with largest meal. 12/12/16   Newt Lukes, MD  LANTUS SOLOSTAR 100 UNIT/ML Solostar Pen Inject 30 Units into the skin 2 (two) times  daily. 12/12/16   Newt Lukes, MD  lisinopril (PRINIVIL,ZESTRIL) 10 MG tablet Take 1 tablet (10 mg total) by mouth daily. 12/12/16   Newt Lukes, MD  metFORMIN (GLUMETZA) 1000 MG (MOD) 24 hr tablet Take 1 tablet (1,000 mg total) by mouth daily with breakfast. 01/02/17   Quentin Angst, MD  nystatin (MYCOSTATIN/NYSTOP) powder Apply topically 4 (four) times daily. Patient not taking: Reported on 02/11/2017 12/12/16   Newt Lukes, MD  ondansetron (ZOFRAN) 4 MG tablet Take 1 tablet (4 mg total) by mouth every 6 (six) hours. Patient not taking: Reported on 02/11/2017 12/22/16   Eyvonne Mechanic, PA-C  TRUEPLUS PEN NEEDLES 32G X 4 MM MISC Test 3x per day 02/11/17   Quentin Angst, MD    Family History Family History  Problem Relation Age of Onset  . Asthma Mother   . Asthma Father     Social History Social History  Substance Use Topics  . Smoking status: Current Every Day Smoker    Packs/day: 0.10    Types: Cigarettes    Last attempt to quit: 11/02/2013  . Smokeless tobacco: Never Used  . Alcohol use No     Allergies   Morphine and related; Glyburide; Ivp dye [iodinated diagnostic agents];  Oxycodone; and Vicodin [hydrocodone-acetaminophen]   Review of Systems Review of Systems  Constitutional: Negative for chills and fever.  HENT: Negative for sore throat and tinnitus.   Eyes: Negative for redness.  Respiratory: Negative for cough and shortness of breath.   Cardiovascular: Negative for chest pain and palpitations.  Gastrointestinal: Negative for abdominal pain, diarrhea, nausea and vomiting.  Genitourinary: Negative for dysuria, frequency and urgency.  Musculoskeletal: Positive for joint swelling. Negative for myalgias.  Skin: Negative for rash.       No lesions  Neurological: Negative for weakness.  Hematological: Does not bruise/bleed easily.  Psychiatric/Behavioral: Negative for suicidal ideas.     Physical Exam Triage Vital Signs ED Triage  Vitals  Enc Vitals Group     BP 09/27/16 1851 120/79     Pulse Rate 09/27/16 1851 (!) 101     Resp 09/27/16 1851 16     Temp 09/27/16 1851 98.3 F (36.8 C)     Temp Source 09/27/16 1851 Oral     SpO2 09/27/16 1851 100 %     Weight --      Height --      Head Circumference --      Peak Flow --      Pain Score 09/27/16 2009 0     Pain Loc --      Pain Edu? --      Excl. in GC? --    No data found.   Updated Vital Signs BP 120/79 (BP Location: Left Arm)   Pulse (!) 101 Comment: rn notified  Temp 98.3 F (36.8 C) (Oral)   Resp 16   SpO2 100%   Visual Acuity Right Eye Distance:   Left Eye Distance:   Bilateral Distance:    Right Eye Near:   Left Eye Near:    Bilateral Near:     Physical Exam  Constitutional: She is oriented to person, place, and time. She appears well-developed and well-nourished. No distress.  HENT:  Head: Normocephalic and atraumatic.  Mouth/Throat: Oropharynx is clear and moist.  Eyes: Pupils are equal, round, and reactive to light. Conjunctivae and EOM are normal. No scleral icterus.  Neck: Normal range of motion. Neck supple. No JVD present. No tracheal deviation present. No thyromegaly present.  Cardiovascular: Normal rate, regular rhythm and normal heart sounds.  Exam reveals no gallop and no friction rub.   No murmur heard. Pulmonary/Chest: Effort normal and breath sounds normal.  Abdominal: Soft. Bowel sounds are normal. She exhibits no distension. There is no tenderness.  Musculoskeletal: Normal range of motion. She exhibits no edema.  Lymphadenopathy:    She has no cervical adenopathy.  Neurological: She is alert and oriented to person, place, and time. No cranial nerve deficit.  Skin: Skin is warm and dry.  Psychiatric: She has a normal mood and affect. Her behavior is normal. Judgment and thought content normal.  Nursing note and vitals reviewed.    UC Treatments / Results  Labs (all labs ordered are listed, but only abnormal  results are displayed) Labs Reviewed - No data to display  EKG  EKG Interpretation None       Radiology No results found.  Procedures Procedures (including critical care time)  Medications Ordered in UC Medications - No data to display   Initial Impression / Assessment and Plan / UC Course  I have reviewed the triage vital signs and the nursing notes.  Pertinent labs & imaging results that were available during my care of the  patient were reviewed by me and considered in my medical decision making (see chart for details).     No fx or dislocation.   Final Clinical Impressions(s) / UC Diagnoses   Final diagnoses:  Sprain of right ankle, unspecified ligament, initial encounter    New Prescriptions Discharge Medication Garrison as of 09/27/2016  8:00 PM       Controlled Substance Prescriptions [place] Controlled Substance Registry consulted? Not Applicable   Arnaldo Natal, MD 03/02/17 [phone]

## 2017-03-04 ENCOUNTER — Other Ambulatory Visit: Payer: Self-pay | Admitting: Pharmacist

## 2017-03-04 MED ORDER — GLUCOSE BLOOD VI STRP: ORAL_STRIP | 12 refills | Status: DC

## 2017-03-04 MED ORDER — TRUEPLUS LANCETS 28G MISC: 12 refills | Status: DC

## 2017-03-04 MED ORDER — TRUE METRIX METER W/DEVICE KIT: PACK | 0 refills | Status: DC

## 2017-03-04 MED FILL — !TRUE METRIX BLOOD GLUCOSE: 365 days supply | Qty: 1 | Fill #0

## 2017-03-04 MED FILL — !LANTUS SOLOSTAR 100UNITS/M: 100 | 25 days supply | Qty: 15 | Fill #0

## 2017-03-04 MED FILL — LISINOPRIL 10 MG TABS: 10 | 30 days supply | Qty: 30 | Fill #1

## 2017-03-04 MED FILL — TRUEPLUS PEN NDL 32GX5/32": 32G X 4 MM | 30 days supply | Qty: 100 | Fill #0

## 2017-03-04 MED FILL — TRUE METRIX TEST STRIP: 30 days supply | Qty: 100 | Fill #0

## 2017-03-04 MED FILL — TRUEPLUS PEN NDL 32GX5/32: 32G X 4 MM | 30 days supply | Qty: 100 | Fill #0

## 2017-03-04 MED FILL — TRUEplus LANCETS 28G MISC: 30 days supply | Qty: 100 | Fill #0

## 2017-03-13 ENCOUNTER — Encounter: Payer: Self-pay | Admitting: Internal Medicine

## 2017-03-25 ENCOUNTER — Encounter (HOSPITAL_COMMUNITY): Payer: Self-pay | Admitting: Emergency Medicine

## 2017-03-25 ENCOUNTER — Emergency Department (HOSPITAL_COMMUNITY)
Admission: EM | Admit: 2017-03-25 | Discharge: 2017-03-25 | Disposition: A | Discharge status: Home / Self Care | Payer: Medicaid Other | Attending: Emergency Medicine | Admitting: Emergency Medicine

## 2017-03-25 DIAGNOSIS — Z794 Long term (current) use of insulin: Secondary | ICD-10-CM | POA: Insufficient documentation

## 2017-03-25 DIAGNOSIS — Z79899 Other long term (current) drug therapy: Secondary | ICD-10-CM | POA: Insufficient documentation

## 2017-03-25 DIAGNOSIS — J45909 Unspecified asthma, uncomplicated: Secondary | ICD-10-CM | POA: Insufficient documentation

## 2017-03-25 DIAGNOSIS — E119 Type 2 diabetes mellitus without complications: Secondary | ICD-10-CM | POA: Insufficient documentation

## 2017-03-25 DIAGNOSIS — R6889 Other general symptoms and signs: Secondary | ICD-10-CM | POA: Insufficient documentation

## 2017-03-25 DIAGNOSIS — I10 Essential (primary) hypertension: Secondary | ICD-10-CM | POA: Insufficient documentation

## 2017-03-25 DIAGNOSIS — F17210 Nicotine dependence, cigarettes, uncomplicated: Secondary | ICD-10-CM | POA: Insufficient documentation

## 2017-03-25 MED ORDER — ALBUTEROL SULFATE HFA 108 (90 BASE) MCG/ACT IN AERS: 1.0000 | INHALATION_SPRAY | Freq: Four times a day (QID) | RESPIRATORY_TRACT | 0 refills | Status: DC | PRN

## 2017-03-25 NOTE — Discharge Instructions (Signed)
You presented to ED with symptoms of the flu.   Unfortunately, guidelines do not recommend tamiflu is beneficial after 48 hours of symptoms. It will not provide additional benefit.   Please take tylenol 1000 mg every 8 hours for body aches and fevers.  Stay well hydrated. Rest. Use flonase + any over the counter allergy medication for nasal congestion. If not improving, take half a dose of a nasal decongestant like Sudafed (pseudoephedrine) , caution with this medication as it can cause increase blood pressure. Your albuterol inhaler has been refilled.   Given your history of diabetes and asthma, you are at an increased risk for developing complications after the flu like pneumonia. Please return to the emergency department if your symptoms worsen, you develop shortness of breath, chest pain, cough otherwise follow up with your primary care doctor in 1 week to ensure symptoms are getting worse.

## 2017-03-25 NOTE — ED Triage Notes (Signed)
Patent c/o achy body pain and runny nose onset of Thursday. Patient taking OTC medication with no relief. Took 800mg  ibuprofen at 2:30today with no relief.    Patient adds her left shoulder has been bothering her for a while. Intermittently and pain goes away quickly.

## 2017-03-25 NOTE — ED Provider Notes (Signed)
[facility] DEPT Provider Note   CSN: [id number] Arrival date & time: 03/25/17  1717     History   Chief Complaint Chief Complaint  Patient presents with  . Chills    HPI Sarah Garrison is a 36 y.o. female with history of insulin-dependent diabetes, asthma, obesity presents to the ED for evaluation of generalized chills, body aches, low-grade fevers, nasal congestion, rhinorrhea decreased appetite, nausea, vomiting, diarrhea 4 days. Patient has been taking TheraFlu, ibuprofen, Tylenol with only temporary relief. Denies chest pain, cough, shortness of breath. Reports her boss came into work with a cough last week otherwise denies sick contacts with the flu. No aggravating factors. Smokes cigarettes.  HPI  Past Medical History:  Diagnosis Date  . Asthma   . Diabetes mellitus without complication ([place])   . GERD (gastroesophageal reflux disease)   . Hypertension     Patient Active Problem List   Diagnosis Date Noted  . Uncontrolled type 2 diabetes mellitus with complication, with long-term current use of insulin ([place]) 12/13/2016  . Non-adherence to medical treatment 12/13/2016  . Asthma 07/30/2016  . 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/07/2016  . Encounter for pregnancy test 02/01/2016  . Leg swelling 09/29/2015  . Morbid obesity, unspecified obesity type ([place]) 09/29/2015  . DM neuropathy, type II diabetes mellitus ([place]) 08/24/2015    Past Surgical History:  Procedure Laterality Date  . CESAREAN SECTION    . TUBAL LIGATION    . WISDOM TOOTH EXTRACTION      OB History    Gravida Para Term Preterm AB Living   5 4     1 4    SAB TAB Ectopic Multiple Live Births   1               Home Medications    Prior to Admission medications   Medication Sig Start Date End Date Taking? Authorizing Provider  albuterol (PROVENTIL HFA;VENTOLIN HFA) 108 (90 Base) MCG/ACT inhaler Inhale 1-2 puffs into the lungs every 6 (six) hours as needed for wheezing or  shortness of breath. 03/25/17   Kinnie Feil, PA-C  Blood Glucose Monitoring Suppl (TRUE METRIX METER) w/Device KIT Use as directed 03/04/17   Tresa Garter, MD  gabapentin (NEURONTIN) 300 MG capsule Take 1 capsule (300 mg total) by mouth 2 (two) times daily. 01/02/17   Tresa Garter, MD  glucose blood (TRUE METRIX BLOOD GLUCOSE TEST) test strip Use as instructed 03/04/17   Angelica Chessman E, MD  insulin aspart (NOVOLOG) 100 UNIT/ML FlexPen Inject 10 Units into the skin 3 (three) times daily with meals. Take with largest meal. 12/12/16   Rowe Clack, MD  LANTUS SOLOSTAR 100 UNIT/ML Solostar Pen Inject 30 Units into the skin 2 (two) times daily. 12/12/16   Rowe Clack, MD  lisinopril (PRINIVIL,ZESTRIL) 10 MG tablet Take 1 tablet (10 mg total) by mouth daily. 12/12/16   Rowe Clack, MD  metFORMIN (GLUMETZA) 1000 MG (MOD) 24 hr tablet Take 1 tablet (1,000 mg total) by mouth daily with breakfast. 01/02/17   Tresa Garter, MD  nystatin (MYCOSTATIN/NYSTOP) powder Apply topically 4 (four) times daily. Patient not taking: Reported on 02/11/2017 12/12/16   Rowe Clack, MD  ondansetron (ZOFRAN) 4 MG tablet Take 1 tablet (4 mg total) by mouth every 6 (six) hours. Patient not taking: Reported on 02/11/2017 12/22/16   Okey Regal, PA-C  TRUEPLUS LANCETS 28G MISC Use as directed 03/04/17   Tresa Garter, MD  TRUEPLUS PEN  NEEDLES 32G X 4 MM MISC Test 3x per day 02/11/17   Tresa Garter, MD    Family History Family History  Problem Relation Age of Onset  . Asthma Mother   . Asthma Father     Social History Social History  Substance Use Topics  . Smoking status: Current Every Day Smoker    Packs/day: 0.10    Types: Cigarettes    Last attempt to quit: 11/02/2013  . Smokeless tobacco: Never Used  . Alcohol use No     Allergies   Morphine and related; Glyburide; Ivp dye [iodinated diagnostic agents]; Oxycodone; and Vicodin  [hydrocodone-acetaminophen]   Review of Systems Review of Systems  Constitutional: Positive for chills and fever.  HENT: Positive for congestion, postnasal drip and rhinorrhea. Negative for sore throat, trouble swallowing and voice change.   Respiratory: Negative for cough, chest tightness and shortness of breath.   Cardiovascular: Negative for chest pain.  Gastrointestinal: Positive for diarrhea, nausea and vomiting. Negative for abdominal pain, blood in stool and constipation.  Genitourinary: Negative for difficulty urinating and dysuria.  Musculoskeletal: Positive for myalgias. Negative for arthralgias.  Skin: Negative for rash.  Allergic/Immunologic: Positive for immunocompromised state.  Neurological: Positive for headaches.     Physical Exam Updated Vital Signs BP 106/84 (BP Location: Right Arm)   Pulse 83   Temp 98.5 F (36.9 C) (Oral)   Resp 18   Ht 5' 4"  (1.626 m)   Wt 96.9 kg (213 lb 11.2 oz)   SpO2 98%   BMI 36.68 kg/m   Physical Exam  Constitutional: She is oriented to person, place, and time. She appears well-developed and well-nourished. No distress.  HENT:  Head: Normocephalic and atraumatic.  Mouth/Throat: Oropharynx is clear and moist. No oropharyngeal exudate.  Moderate mucosal edema bilaterally Moist mucous membranes Oropharynx and tonsils normal bilaterally  Eyes: Pupils are equal, round, and reactive to light. Conjunctivae and EOM are normal.  Neck: Normal range of motion. Neck supple.  No cervical adenopathy Neck supple with full PROM and no rigidity   Cardiovascular: Normal rate, regular rhythm, normal heart sounds and intact distal pulses.   No murmur heard. Pulmonary/Chest: Effort normal and breath sounds normal. No respiratory distress. She has no wheezes. She has no rales. She exhibits no tenderness.  Abdominal: Soft. Bowel sounds are normal. There is no tenderness. A hernia is present.  Musculoskeletal: Normal range of motion. She exhibits no  deformity.  Neurological: She is alert and oriented to person, place, and time. No sensory deficit.  Skin: Skin is warm and dry. Capillary refill takes less than 2 seconds.  Psychiatric: She has a normal mood and affect. Her behavior is normal. Judgment and thought content normal.  Nursing note and vitals reviewed.    ED Treatments / Results  Labs (all labs ordered are listed, but only abnormal results are displayed) Labs Reviewed - No data to display  EKG  EKG Interpretation None       Radiology No results found.  Procedures Procedures (including critical care time)  Medications Ordered in ED Medications - No data to display   Initial Impression / Assessment and Plan / ED Course  I have reviewed the triage vital signs and the nursing notes.  Pertinent labs & imaging results that were available during my care of the patient were reviewed by me and considered in my medical decision making (see chart for details).    36 y.o. -year-old female with ppmh of obesity, IDDM, asthma presents  to the ED with URI symptoms  4 days. Also, n/v/d that has resolved. Symptoms most likely due to a viral URI.   On my exam patient is nontoxic appearing, speaking in full sentences. No fever, no tachypnea, no tachycardia, normal oxygen saturations. Lungs are clear to auscultation bilaterally without wheezing, rales, egophony. I do not think that a chest x-ray is indicated at this time as vital signs are within normal limits, there are no signs of consolidation on chest exam, there is no hypoxia.   Final Clinical Impressions(s) / ED Diagnoses   Doubt bacterial bronchitis, pneumonia. I think that symptoms can be treated conservatively at this point. Suspect influenza or other viral URI. Given reassuring physical exam and vital signs within normal limits patient will be discharged with symptomatic treatment. She is aware that a viral URI infection may precede the onset of bacterial bronchitis or  pneumonia especially as she is immunocompromised.  Tamiflu not indicated today as symptoms ongoing for 4-5 days. Patient is aware of red flag symptoms to monitor for that would warrant return to the ED for further reevaluation. She has scheduled appt with PCP in 1 week.  Final diagnoses:  Flu-like symptoms    New Prescriptions New Prescriptions   ALBUTEROL (PROVENTIL HFA;VENTOLIN HFA) 108 (90 BASE) MCG/ACT INHALER    Inhale 1-2 puffs into the lungs every 6 (six) hours as needed for wheezing or shortness of breath.     Kinnie Feil, PA-C 03/25/17 2045    Merrily Pew, MD 03/26/17 0000

## 2017-03-25 NOTE — ED Notes (Signed)
Pt states that she has been having chills and body aches x 5 days and has been having fevers, nasal congestion, Pt states that she has had poor appetite and nausea and vomiting x 2 episode

## 2017-03-30 ENCOUNTER — Emergency Department (HOSPITAL_COMMUNITY): Payer: Medicaid Other

## 2017-03-30 ENCOUNTER — Emergency Department (HOSPITAL_COMMUNITY)
Admission: EM | Admit: 2017-03-30 | Discharge: 2017-03-30 | Disposition: A | Discharge status: Home / Self Care | Payer: Medicaid Other | Attending: Emergency Medicine | Admitting: Emergency Medicine

## 2017-03-30 ENCOUNTER — Encounter (HOSPITAL_COMMUNITY): Payer: Self-pay | Admitting: Emergency Medicine

## 2017-03-30 DIAGNOSIS — Z79899 Other long term (current) drug therapy: Secondary | ICD-10-CM | POA: Insufficient documentation

## 2017-03-30 DIAGNOSIS — E119 Type 2 diabetes mellitus without complications: Secondary | ICD-10-CM | POA: Insufficient documentation

## 2017-03-30 DIAGNOSIS — J40 Bronchitis, not specified as acute or chronic: Secondary | ICD-10-CM

## 2017-03-30 DIAGNOSIS — Z794 Long term (current) use of insulin: Secondary | ICD-10-CM | POA: Insufficient documentation

## 2017-03-30 DIAGNOSIS — I10 Essential (primary) hypertension: Secondary | ICD-10-CM | POA: Insufficient documentation

## 2017-03-30 DIAGNOSIS — F17210 Nicotine dependence, cigarettes, uncomplicated: Secondary | ICD-10-CM | POA: Insufficient documentation

## 2017-03-30 LAB — CBC
HEMATOCRIT: 42.4 % (ref 36.0–46.0)
HEMOGLOBIN: 14.7 g/dL (ref 12.0–15.0)
MCH: 30.4 pg (ref 26.0–34.0)
MCHC: 34.7 g/dL (ref 30.0–36.0)
MCV: 87.8 fL (ref 78.0–100.0)
Platelets: 306 10*3/uL (ref 150–400)
RBC: 4.83 MIL/uL (ref 3.87–5.11)
RDW: 12.1 % (ref 11.5–15.5)
WBC: 8.2 10*3/uL (ref 4.0–10.5)

## 2017-03-30 LAB — BASIC METABOLIC PANEL
ANION GAP: 10 (ref 5–15)
BUN: 13 mg/dL (ref 6–20)
CHLORIDE: 102 mmol/L (ref 101–111)
CO2: 20 mmol/L — ABNORMAL LOW (ref 22–32)
Calcium: 9.2 mg/dL (ref 8.9–10.3)
Creatinine, Ser: 0.69 mg/dL (ref 0.44–1.00)
Glucose, Bld: 398 mg/dL — ABNORMAL HIGH (ref 65–99)
POTASSIUM: 3.7 mmol/L (ref 3.5–5.1)
SODIUM: 132 mmol/L — AB (ref 135–145)

## 2017-03-30 LAB — I-STAT BETA HCG BLOOD, ED (MC, WL, AP ONLY)

## 2017-03-30 LAB — D-DIMER, QUANTITATIVE (NOT AT ARMC): D DIMER QUANT: 0.48 ug{FEU}/mL (ref 0.00–0.50)

## 2017-03-30 LAB — I-STAT TROPONIN, ED
TROPONIN I, POC: 0 ng/mL (ref 0.00–0.08)
Troponin i, poc: 0 ng/mL (ref 0.00–0.08)

## 2017-03-30 MED ORDER — ALBUTEROL SULFATE HFA 108 (90 BASE) MCG/ACT IN AERS
1.0000 | INHALATION_SPRAY | Freq: Once | RESPIRATORY_TRACT | Status: DC
  Filled 2017-03-30: qty 6.7

## 2017-03-30 MED ORDER — IPRATROPIUM-ALBUTEROL 0.5-2.5 (3) MG/3ML IN SOLN
3.0000 mL | Freq: Once | RESPIRATORY_TRACT | Status: AC
  Administered 2017-03-30: 3 mL via RESPIRATORY_TRACT
  Filled 2017-03-30: qty 3

## 2017-03-30 MED ORDER — ALBUTEROL SULFATE (2.5 MG/3ML) 0.083% IN NEBU
5.0000 mg | INHALATION_SOLUTION | Freq: Once | RESPIRATORY_TRACT | Status: AC
  Administered 2017-03-30: 5 mg via RESPIRATORY_TRACT

## 2017-03-30 MED ORDER — ALBUTEROL SULFATE (2.5 MG/3ML) 0.083% IN NEBU
INHALATION_SOLUTION | RESPIRATORY_TRACT | Status: AC
  Administered 2017-03-30: 5 mg via RESPIRATORY_TRACT
  Filled 2017-03-30: qty 6

## 2017-03-30 MED ORDER — GI COCKTAIL ~~LOC~~
30.0000 mL | Freq: Once | ORAL | Status: AC
  Administered 2017-03-30: 30 mL via ORAL
  Filled 2017-03-30: qty 30

## 2017-03-30 MED ORDER — BENZONATATE 100 MG PO CAPS: 100.0000 mg | ORAL_CAPSULE | Freq: Three times a day (TID) | ORAL | 0 refills | Status: DC

## 2017-03-30 MED ORDER — PREDNISONE 20 MG PO TABS: 40.0000 mg | ORAL_TABLET | Freq: Every day | ORAL | 0 refills | Status: DC

## 2017-03-30 MED ORDER — KETOROLAC TROMETHAMINE 15 MG/ML IJ SOLN
15.0000 mg | Freq: Once | INTRAMUSCULAR | Status: AC
  Administered 2017-03-30: 15 mg via INTRAVENOUS
  Filled 2017-03-30: qty 1

## 2017-03-30 MED ORDER — PREDNISONE 20 MG PO TABS
40.0000 mg | ORAL_TABLET | Freq: Once | ORAL | Status: AC
  Administered 2017-03-30: 40 mg via ORAL
  Filled 2017-03-30: qty 2

## 2017-03-30 MED ORDER — ALBUTEROL SULFATE HFA 108 (90 BASE) MCG/ACT IN AERS: 1.0000 | INHALATION_SPRAY | Freq: Four times a day (QID) | RESPIRATORY_TRACT | 0 refills | Status: DC | PRN

## 2017-03-30 NOTE — ED Notes (Signed)
ED Provider at bedside. 

## 2017-03-30 NOTE — Discharge Instructions (Signed)
Your lab work and imaging has been reassuring.  This is likely a viral bronchitis.  Use the inhaler as needed for wheezing.  Have given you Tessalon for cough.  Use the steroids starting tomorrow for the next 3 days.  Please keep a close eye on your sugars that this will make your sugars elevated.  Use your insulin as needed.  May take Motrin and Tylenol for pain.  Follow-up with your primary care doctor within the next 2-3 days.  Return to the ED if your symptoms worsen.

## 2017-03-30 NOTE — ED Triage Notes (Signed)
C/o sob and pain in center of chest that started tonight.  Denies cough.  [facility] ? Flu.

## 2017-03-30 NOTE — ED Notes (Signed)
Pt did not need anything at this time  

## 2017-04-01 NOTE — ED Provider Notes (Signed)
[facility] EMERGENCY DEPARTMENT Provider Note   CSN: [id number] Arrival date & time: 03/30/17  0243     History   Chief Complaint Chief Complaint  Patient presents with  . Chest Pain  . Shortness of Breath    HPI Sarah Garrison is a 36 y.o. female.  HPI 36 year old African-American female past medical history significant for insulin-dependent diabetes, asthma, obesity, GERD presents to the emergency department today for evaluation of ongoing URI symptoms and new onset chest pain last night.  Patient states that she was seen in the ED on 10/22 and diagnosed with possible viral URI.  Patient states that her symptoms have persisted.  She endorses productive cough, rhinorrhea, sore throat, fever, chills.  Patient also states that last night she developed substernal chest pain.  See the denies history of PE/DVT, prolonged immobilization, recent hospitalization/surgeries, chest pain does not radiate.  Pain is constant.  Nothing makes better.  Not associated exertion.  Chest pain is mildly pleuritic in nature.  Reports associated shortness of breath.  Denies any associated diaphoresis, nausea, emesis.  Has not taken any of her symptoms prior to arrival.Reports sick contacts.  Does report tobacco use.   denies any history of PE/DVT, prolonged immobilization, recent hospitalization/surgeries, unilateral leg swelling or calf tenderness, hormone use.  Denies any cardiac history.  Denies any significant family cardiac history.  Pt denies any fever, chill, ha, vision changes, lightheadedness, dizziness, neck pain, abd pain, n/v/d, urinary symptoms, change in bowel habits, melena, hematochezia, lower extremity paresthesias.  Past Medical History:  Diagnosis Date  . Asthma   . Diabetes mellitus without complication ([place])   . GERD (gastroesophageal reflux disease)   . Hypertension     Patient Active Problem List   Diagnosis Date Noted  . Uncontrolled type 2 diabetes  mellitus with complication, with long-term current use of insulin ([place]) 12/13/2016  . Non-adherence to medical treatment 12/13/2016  . Asthma 07/30/2016  . 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/07/2016  . Encounter for pregnancy test 02/01/2016  . Leg swelling 09/29/2015  . Morbid obesity, unspecified obesity type ([place]) 09/29/2015  . DM neuropathy, type II diabetes mellitus ([place]) 08/24/2015    Past Surgical History:  Procedure Laterality Date  . CESAREAN SECTION    . TUBAL LIGATION    . WISDOM TOOTH EXTRACTION      OB History    Gravida Para Term Preterm AB Living   5 4     1 4    SAB TAB Ectopic Multiple Live Births   1               Home Medications    Prior to Admission medications   Medication Sig Start Date End Date Taking? Authorizing Provider  Blood Glucose Monitoring Suppl (TRUE METRIX METER) w/Device KIT Use as directed 03/04/17  Yes Jegede, Olugbemiga E, MD  gabapentin (NEURONTIN) 300 MG capsule Take 1 capsule (300 mg total) by mouth 2 (two) times daily. 01/02/17  Yes Jegede, Olugbemiga E, MD  glucose blood (TRUE METRIX BLOOD GLUCOSE TEST) test strip Use as instructed 03/04/17  Yes Jegede, Olugbemiga E, MD  insulin aspart (NOVOLOG) 100 UNIT/ML FlexPen Inject 10 Units into the skin 3 (three) times daily with meals. Take with largest meal. 12/12/16  Yes Rowe Clack, MD  LANTUS SOLOSTAR 100 UNIT/ML Solostar Pen Inject 30 Units into the skin 2 (two) times daily. 12/12/16  Yes Rowe Clack, MD  lisinopril (PRINIVIL,ZESTRIL) 10 MG tablet Take 1 tablet (10 mg total) by mouth  daily. 12/12/16  Yes Rowe Clack, MD  metFORMIN (GLUCOPHAGE-XR) 500 MG 24 hr tablet Take 500 mg by mouth 2 (two) times daily.   Yes [provider]  TRUEPLUS LANCETS 28G MISC Use as directed 03/04/17  Yes Jegede, Olugbemiga E, MD  TRUEPLUS PEN NEEDLES 32G X 4 MM MISC Test 3x per day 02/11/17  Yes Jegede, Olugbemiga E, MD  albuterol (PROVENTIL HFA;VENTOLIN HFA) 108 (90 Base) MCG/ACT inhaler Inhale  1-2 puffs into the lungs every 6 (six) hours as needed for wheezing or shortness of breath. 03/30/17   Doristine Devoid, PA-C  benzonatate (TESSALON) 100 MG capsule Take 1 capsule (100 mg total) by mouth every 8 (eight) hours. 03/30/17   Doristine Devoid, PA-C  predniSONE (DELTASONE) 20 MG tablet Take 2 tablets (40 mg total) by mouth daily. 03/30/17   Doristine Devoid, PA-C    Family History Family History  Problem Relation Age of Onset  . Asthma Mother   . Asthma Father     Social History Social History  Substance Use Topics  . Smoking status: Current Every Day Smoker    Packs/day: 0.10    Types: Cigarettes    Last attempt to quit: 11/02/2013  . Smokeless tobacco: Never Used  . Alcohol use No     Allergies   Morphine and related; Glyburide; Ivp dye [iodinated diagnostic agents]; Oxycodone; and Vicodin [hydrocodone-acetaminophen]   Review of Systems Review of Systems  Constitutional: Negative for chills, diaphoresis and fever.  HENT: Positive for congestion, rhinorrhea, sinus pain and sore throat. Negative for ear pain.   Eyes: Negative for visual disturbance.  Respiratory: Positive for cough, shortness of breath and wheezing.   Cardiovascular: Positive for chest pain. Negative for palpitations and leg swelling.  Gastrointestinal: Negative for abdominal pain, diarrhea, nausea and vomiting.  Genitourinary: Negative for dysuria, flank pain, frequency, hematuria and urgency.  Musculoskeletal: Negative for arthralgias and myalgias.  Skin: Negative for rash.  Neurological: Negative for dizziness, syncope, weakness, light-headedness, numbness and headaches.  Psychiatric/Behavioral: Negative for sleep disturbance. The patient is not nervous/anxious.      Physical Exam Updated Vital Signs BP 122/86   Pulse 83   Temp 98.2 F (36.8 C) (Oral)   Resp (!) 27   SpO2 100%   Physical Exam  Constitutional: She is oriented to person, place, and time. She appears  well-developed and well-nourished.  Non-toxic appearance. No distress.  HENT:  Head: Normocephalic and atraumatic.  Right Ear: Tympanic membrane, external ear and ear canal normal.  Left Ear: Tympanic membrane, external ear and ear canal normal.  Nose: Mucosal edema and rhinorrhea present.  Mouth/Throat: Uvula is midline and mucous membranes are normal. No trismus in the jaw. No uvula swelling. Posterior oropharyngeal erythema present. No oropharyngeal exudate, posterior oropharyngeal edema or tonsillar abscesses. Tonsils are 1+ on the right. Tonsils are 1+ on the left. No tonsillar exudate.  Eyes: Pupils are equal, round, and reactive to light. Conjunctivae are normal. Right eye exhibits no discharge. Left eye exhibits no discharge.  Neck: Normal range of motion. Neck supple. No JVD present. No tracheal deviation present.  Cardiovascular: Normal rate, regular rhythm, normal heart sounds and intact distal pulses.  Exam reveals no gallop and no friction rub.   No murmur heard. Pulmonary/Chest: Effort normal. No accessory muscle usage. No tachypnea. No respiratory distress. She has no decreased breath sounds. She has wheezes. She has no rhonchi. She has no rales. She exhibits no tenderness.  No hypoxia or tachypnea.  Abdominal: Soft. Bowel sounds are normal. She exhibits no distension. There is no tenderness. There is no rebound and no guarding.  Musculoskeletal: Normal range of motion.  No lower extremity edema or calf tenderness.  Lymphadenopathy:    She has no cervical adenopathy.  Neurological: She is alert and oriented to person, place, and time.  Skin: Skin is warm and dry. Capillary refill takes less than 2 seconds. She is not diaphoretic.  Psychiatric: Her behavior is normal. Judgment and thought content normal.  Nursing note and vitals reviewed.    ED Treatments / Results  Labs (all labs ordered are listed, but only abnormal results are displayed) Labs Reviewed  BASIC METABOLIC  PANEL - Abnormal; Notable for the following:       Result Value   Sodium 132 (*)    CO2 20 (*)    Glucose, Bld 398 (*)    All other components within normal limits  CBC  D-DIMER, QUANTITATIVE (NOT AT [facility])  I-STAT TROPONIN, ED  I-STAT BETA HCG BLOOD, ED ([facility], [facility], [facility] ONLY)  I-STAT TROPONIN, ED    EKG  EKG Interpretation  Date/Time:  Saturday March 30 2017 02:55:14 EDT Ventricular Rate:  123 PR Interval:  154 QRS Duration: 80 QT Interval:  310 QTC Calculation: 443 R Axis:   110 Text Interpretation:  Sinus tachycardia Right axis deviation Possible Anterior infarct , age undetermined Abnormal ECG Since last tracing rate faster Otherwise no significant change Confirmed by Deno Etienne [phone]) on 03/30/2017 5:11:43 AM Also confirmed by Deno Etienne [phone]), editor Philomena Doheny [phone])  on 03/30/2017 8:49:58 AM       Radiology No results found.  Procedures Procedures (including critical care time)  Medications Ordered in ED Medications  albuterol (PROVENTIL) (2.5 MG/3ML) 0.083% nebulizer solution 5 mg (5 mg Nebulization Not Given 03/30/17 0643)  predniSONE (DELTASONE) tablet 40 mg (40 mg Oral Given 03/30/17 2297)  gi cocktail (Maalox,Lidocaine,Donnatal) (30 mLs Oral Given 03/30/17 9892)  ipratropium-albuterol (DUONEB) 0.5-2.5 (3) MG/3ML nebulizer solution 3 mL (3 mLs Nebulization Given 03/30/17 1194)  ketorolac (TORADOL) 15 MG/ML injection 15 mg (15 mg Intravenous Given 03/30/17 1740)     Initial Impression / Assessment and Plan / ED Course  I have reviewed the triage vital signs and the nursing notes.  Pertinent labs & imaging results that were available during my care of the patient were reviewed by me and considered in my medical decision making (see chart for details).    Patient presents to the ED for evaluation of ongoing URI symptoms, productive cough, wheezing, new onset chest pain.   patient is overall well-appearing and nontoxic.  Vital signs are reassuring.   Patient is afebrile, no tachycardia, no tachypnea or hypoxia.  Patient with scattered expiratory wheezes.  Abdomen is soft and nontender to palpation.  No lower extremity edema or calf tenderness.  Workup is reassuring.  No leukocytosis.  Electrolytes are at patient's baseline.  Mild hyperglycemia noted however patient has not had her morning dose of insulin.  Hemoglobin is stable.  Negative delta troponins.  EKG shows no ischemic changes and appears similar to prior tracings.  D-dimer is negative.  Chest x-ray shows bronchitis changes but no focal infiltrate concerning for pneumonia.    Patient given DuoNeb, steroids and Tessalon in the ED.  Symptoms improved.  Patient also given GI cocktail and Toradol with complete resolution of her chest pain.   Chest pain likely related to musculoskeletal pain from coughing.  Clinical presentation and workup does not  seem consistent with PE, dissection, ACS, or pneumonia.  Likely a viral bronchitis.  Will treat with steroids and inhaler.  Encourage patient to check sugars regularly given her history of diabetes and starting on steroids.  Encourage smoking cessation.  Do not feel that antibiotics are indicated at this time as patient is afebrile and no hypoxia noted.   Pt has been advised to return to the ED is CP becomes exertional, associated with diaphoresis or nausea, radiates to left jaw/arm, worsens or becomes concerning in any way. Pt appears reliable for follow up and is agreeable to discharge.   Pt is hemodynamically stable, in NAD, & able to ambulate in the ED. Evaluation does not show pathology that would require ongoing emergent intervention or inpatient treatment. I explained the diagnosis to the patient. Pain has been managed & has no complaints prior to dc. Pt is comfortable with above plan and is stable for discharge at this time. All questions were answered prior to disposition. Strict return precautions for f/u to the ED were discussed.  Encouraged follow up with PCP.   Final Clinical Impressions(s) / ED Diagnoses   Final diagnoses:  Bronchitis    New Prescriptions Discharge Medication List as of 03/30/2017  8:28 AM    START taking these medications   Details  benzonatate (TESSALON) 100 MG capsule Take 1 capsule (100 mg total) by mouth every 8 (eight) hours., Starting Sat 03/30/2017, Print    predniSONE (DELTASONE) 20 MG tablet Take 2 tablets (40 mg total) by mouth daily., Starting Sat 03/30/2017, Print         Doristine Devoid, PA-C 04/01/17 [place], [place], DO 04/03/17 [phone]

## 2017-04-11 ENCOUNTER — Ambulatory Visit: Payer: Self-pay | Attending: Internal Medicine | Admitting: *Deleted

## 2017-04-11 DIAGNOSIS — Z3042 Encounter for surveillance of injectable contraceptive: Secondary | ICD-10-CM

## 2017-04-11 DIAGNOSIS — Z3202 Encounter for pregnancy test, result negative: Secondary | ICD-10-CM

## 2017-04-11 MED ORDER — MEDROXYPROGESTERONE ACETATE 150 MG/ML IM SUSP
150.0000 mg | Freq: Once | INTRAMUSCULAR | Status: AC
  Administered 2017-04-11: 150 mg via INTRAMUSCULAR

## 2017-04-11 NOTE — Progress Notes (Signed)
Last Depo-Provera: 01/02/2017. Side Effects if any: n/a. Urine  HCG indicated? Yes . Depo-Provera 150 mg IM given by: T.Danea Manter,RN. Right upper outer quadrant Next appointment [id number]:January  24th - Feb 4th.

## 2017-04-22 ENCOUNTER — Ambulatory Visit (HOSPITAL_COMMUNITY)
Admission: EM | Admit: 2017-04-22 | Discharge: 2017-04-22 | Disposition: A | Discharge status: Home / Self Care | Payer: Medicaid Other | Attending: Emergency Medicine | Admitting: Emergency Medicine

## 2017-04-22 ENCOUNTER — Encounter (HOSPITAL_COMMUNITY): Payer: Self-pay | Admitting: Emergency Medicine

## 2017-04-22 DIAGNOSIS — R739 Hyperglycemia, unspecified: Secondary | ICD-10-CM

## 2017-04-22 DIAGNOSIS — R519 Headache, unspecified: Secondary | ICD-10-CM

## 2017-04-22 DIAGNOSIS — R51 Headache: Secondary | ICD-10-CM

## 2017-04-22 LAB — POCT I-STAT, CHEM 8
BUN: 8 mg/dL (ref 6–20)
CALCIUM ION: 1.17 mmol/L (ref 1.15–1.40)
CHLORIDE: 101 mmol/L (ref 101–111)
CREATININE: 0.5 mg/dL (ref 0.44–1.00)
Glucose, Bld: 416 mg/dL — ABNORMAL HIGH (ref 65–99)
HCT: 45 % (ref 36.0–46.0)
Hemoglobin: 15.3 g/dL — ABNORMAL HIGH (ref 12.0–15.0)
Potassium: 4 mmol/L (ref 3.5–5.1)
SODIUM: 137 mmol/L (ref 135–145)
TCO2: 23 mmol/L (ref 22–32)

## 2017-04-22 LAB — POCT URINALYSIS DIP (DEVICE)
BILIRUBIN URINE: NEGATIVE
Glucose, UA: 500 mg/dL — AB
HGB URINE DIPSTICK: NEGATIVE
Ketones, ur: NEGATIVE mg/dL
LEUKOCYTES UA: NEGATIVE
Nitrite: NEGATIVE
Protein, ur: NEGATIVE mg/dL
SPECIFIC GRAVITY, URINE: 1.015 (ref 1.005–1.030)
UROBILINOGEN UA: 1 mg/dL (ref 0.0–1.0)
pH: 5.5 (ref 5.0–8.0)

## 2017-04-22 LAB — GLUCOSE, CAPILLARY: Glucose-Capillary: 339 mg/dL — ABNORMAL HIGH (ref 65–99)

## 2017-04-22 MED ORDER — IBUPROFEN 600 MG PO TABS: 600.0000 mg | ORAL_TABLET | Freq: Four times a day (QID) | ORAL | 0 refills | Status: DC | PRN

## 2017-04-22 NOTE — ED Provider Notes (Signed)
HPI  SUBJECTIVE:  Sarah Garrison is a 36 y.o. female who reports gradual onset frontal headache also located behind her left eye that has been constant and daily for the past 2 days.  She states that her glucose has been high over the past several days.  Admits to not taking her insulin as she should because she has been running low on it.  States that she does not have the funds for it.  States that she ran out of her insulin today but has a refill available tomorrow.  Has 10 units at home left over for tonight.  She states that her blood pressure has been labile but does not know what the range has been.  She states that she usually gets headaches like these when her blood pressure is high.  She does admit to being under increased amount of stress with some deaths in the family recently.  She tried ibuprofen 800 mg with some improvement of her headache and took her lisinopril.  Symptoms worse with light.  She denies nausea, vomiting, fevers, nasal congestion, sinus pain or pressure.  Mild photophobia.  No photophobia.  No dental pain, neck stiffness, dysarthria, discoordination, aphasia.  No arm or leg weakness, facial droop.  No visual changes, rash.  This did not occur with exertion.  No syncope.  No seizures.  No jaw pain.  She reports mild left ear pain starting today while in clinic.  She has a past medical history of poorly controlled diabetes is on NovoLog 10 units when she eats, Lantus 30 units in the morning and at night, hypertension, asthma, smoker.  No history of DKA, migraines, stroke.  LMP: On Depo.  She is status post bilateral tubal ligation.  HEN:IDPOEU, Marlena Clipper, MD   Past Medical History:  Diagnosis Date  . Asthma   . Diabetes mellitus without complication ([place])   . GERD (gastroesophageal reflux disease)   . Hypertension     Past Surgical History:  Procedure Laterality Date  . CESAREAN SECTION    . TUBAL LIGATION    . WISDOM TOOTH EXTRACTION      Family History   Problem Relation Age of Onset  . Asthma Mother   . Asthma Father     Social History   Tobacco Use  . Smoking status: Current Every Day Smoker    Packs/day: 0.10    Types: Cigarettes    Last attempt to quit: 11/02/2013    Years since quitting: 3.4  . Smokeless tobacco: Never Used  Substance Use Topics  . Alcohol use: No  . Drug use: No    No current facility-administered medications for this encounter.   Current Outpatient Medications:  .  albuterol (PROVENTIL HFA;VENTOLIN HFA) 108 (90 Base) MCG/ACT inhaler, Inhale 1-2 puffs into the lungs every 6 (six) hours as needed for wheezing or shortness of breath., Disp: 1 Inhaler, Rfl: 0 .  Blood Glucose Monitoring Suppl (TRUE METRIX METER) w/Device KIT, Use as directed, Disp: 1 kit, Rfl: 0 .  gabapentin (NEURONTIN) 300 MG capsule, Take 1 capsule (300 mg total) by mouth 2 (two) times daily., Disp: 180 capsule, Rfl: 3 .  glucose blood (TRUE METRIX BLOOD GLUCOSE TEST) test strip, Use as instructed, Disp: 100 each, Rfl: 12 .  ibuprofen (ADVIL,MOTRIN) 600 MG tablet, Take 1 tablet (600 mg total) every 6 (six) hours as needed by mouth., Disp: 30 tablet, Rfl: 0 .  insulin aspart (NOVOLOG) 100 UNIT/ML FlexPen, Inject 10 Units into the skin 3 (three)  times daily with meals. Take with largest meal., Disp: 6 mL, Rfl: 3 .  LANTUS SOLOSTAR 100 UNIT/ML Solostar Pen, Inject 30 Units into the skin 2 (two) times daily., Disp: 15 mL, Rfl: 0 .  lisinopril (PRINIVIL,ZESTRIL) 10 MG tablet, Take 1 tablet (10 mg total) by mouth daily., Disp: 90 tablet, Rfl: 3 .  metFORMIN (GLUCOPHAGE-XR) 500 MG 24 hr tablet, Take 500 mg by mouth 2 (two) times daily., Disp: , Rfl:  .  TRUEPLUS LANCETS 28G MISC, Use as directed, Disp: 100 each, Rfl: 12 .  TRUEPLUS PEN NEEDLES 32G X 4 MM MISC, Test 3x per day, Disp: 100 each, Rfl: 5  Allergies  Allergen Reactions  . Morphine And Related Shortness Of Breath  . Glyburide Diarrhea and Other (See Comments)    Reaction:  Nose bleeds    . Ivp Dye [Iodinated Diagnostic Agents]     Shortness of breath.    . Oxycodone Other (See Comments)    Pt states that this medication makes her "dream about rabbits chasing" her.    . Vicodin [Hydrocodone-Acetaminophen] Other (See Comments)    Pt states that this medication makes her "dream about rabbits chasing" her.       ROS  As noted in HPI.   Physical Exam  BP 128/90 (BP Location: Right Arm)   Pulse (!) 112   Temp 98.9 F (37.2 C) (Oral)   Resp 18   Ht 5' 4"  (1.626 m)   Wt 215 lb (97.5 kg)   SpO2 100%   BMI 36.90 kg/m   Constitutional: Well developed, well nourished, no acute distress Eyes: PERRL, EOMI, conjunctiva normal bilaterally. HENT: Normocephalic, atraumatic,mucus membranes moist, normal dentition.  TM normal b/l. No TMJ tenderness. Normal dentition. No nasal congestion, no sinus tenderness. No temporal artery tenderness.  Neck: no cervical LN - trapezial muscle tenderness. No meningismus Respiratory: normal inspiratory effort, lungs clear bilaterally Cardiovascular: Regular tachycardia, no murmurs rubs or gallops GI:  nondistended skin: No rash, skin intact Musculoskeletal: No edema, no tenderness, no deformities Neurologic: Alert & oriented x 3, CN II-XII intact, romberg neg, finger-> nose, heel-> shin equal b/l, Romberg neg, tandem gait steady Psychiatric: Speech and behavior appropriate   ED Course   Medications - No data to display  Orders Placed This Encounter  Procedures  . Glucose, capillary    Standing Status:   Standing    Number of Occurrences:   1  . I-STAT, chem 8    Standing Status:   Standing    Number of Occurrences:   1  . POCT urinalysis dip (device)    Standing Status:   Standing    Number of Occurrences:   1   Results for orders placed or performed during the hospital encounter of 04/22/17 (from the past 24 hour(s))  Glucose, capillary     Status: Abnormal   Collection Time: 04/22/17  7:43 PM  Result Value Ref Range    Glucose-Capillary 339 (H) 65 - 99 mg/dL  I-STAT, chem 8     Status: Abnormal   Collection Time: 04/22/17  8:01 PM  Result Value Ref Range   Sodium 137 135 - 145 mmol/L   Potassium 4.0 3.5 - 5.1 mmol/L   Chloride 101 101 - 111 mmol/L   BUN 8 6 - 20 mg/dL   Creatinine, Ser 0.50 0.44 - 1.00 mg/dL   Glucose, Bld 416 (H) 65 - 99 mg/dL   Calcium, Ion 1.17 1.15 - 1.40 mmol/L   TCO2 23 22 -  32 mmol/L   Hemoglobin 15.3 (H) 12.0 - 15.0 g/dL   HCT 45.0 36.0 - 46.0 %  POCT urinalysis dip (device)     Status: Abnormal   Collection Time: 04/22/17  8:10 PM  Result Value Ref Range   Glucose, UA 500 (A) NEGATIVE mg/dL   Bilirubin Urine NEGATIVE NEGATIVE   Ketones, ur NEGATIVE NEGATIVE mg/dL   Specific Gravity, Urine 1.015 1.005 - 1.030   Hgb urine dipstick NEGATIVE NEGATIVE   pH 5.5 5.0 - 8.0   Protein, ur NEGATIVE NEGATIVE mg/dL   Urobilinogen, UA 1.0 0.0 - 1.0 mg/dL   Nitrite NEGATIVE NEGATIVE   Leukocytes, UA NEGATIVE NEGATIVE   No results found.   ED Clinical Impression  Acute nonintractable headache, unspecified headache type  Hyperglycemia  ED Assessment/Plan  Pt describing typical pain, no sudden onset. Doubt SAH, ICH or space occupying lesion. Pt without fevers/chills, Pt has no meningeal sx, no nuchal rigidity. Doubt meningitis. Pt with normal neuro exam, no evidence of CVA/TIA.  Pt BP not elevated significantly, doubt hypertensive emergency. No evidence of temporal artery tenderness, no evidence of glaucoma or other ocular pathology.  Does not appear to be from her TMJ, otitis, sinusitis, dental infection.  No ketones in her urine.  No UTI.  Electrolytes are normal, glucose elevated at 416.  Anion gap 13.  She is not in DKA.  Suspect that her headache is coming from the hyperglycemia.  She has a normal blood pressure and states that the headache is still present.  Patient admits to not taking her insulin as directed over the past several days because she was running low on it due  to monetary reasons.  States that she will be able to fill it fully tomorrow.  Gave her the option of going to the ED for IV fluids and insulin, but patient declined.  Will contact her primary care physician and let him know that she was here, and that she may need adjustments to her insulin regimen.  Patient is a difficult historian, but it sounds like her sugars are poorly controlled when she takes her insulin as she should.  Advised ibuprofen 600 mg with 1 g of Tylenol, she declined prescription of Fioricet.  Discussed labs, medical decision making, signs and symptoms return to the emergency department.  Patient agrees with plan.   Meds ordered this encounter  Medications  . ibuprofen (ADVIL,MOTRIN) 600 MG tablet    Sig: Take 1 tablet (600 mg total) every 6 (six) hours as needed by mouth.    Dispense:  30 tablet    Refill:  0    *This clinic note was created using [employer]. Therefore, there may be occasional mistakes despite careful proofreading.  ?   Melynda Ripple, MD 04/22/17 2057

## 2017-04-22 NOTE — Discharge Instructions (Signed)
Continue drinking plenty of water, but you need to get your sugar down.  Call your doctor tomorrow and get an appointment as soon as possible.  You need to have your insulin adjusted.  You can take 600 mg ibuprofen with 1 g of Tylenol 3-4 times a day for your headache.  Go immediately to the ER for any strokelike symptoms, if you get worse, or for any concerns.

## 2017-04-22 NOTE — ED Triage Notes (Signed)
Pt. Stated, I've had a headache and my BP is high. My sugar is high and Im about of my Insulin. My ear is also started hurting.

## 2017-05-29 ENCOUNTER — Ambulatory Visit: Payer: Self-pay | Attending: Family Medicine | Admitting: Family Medicine

## 2017-05-29 ENCOUNTER — Encounter: Payer: Self-pay | Admitting: Family Medicine

## 2017-05-29 ENCOUNTER — Other Ambulatory Visit: Payer: Self-pay | Admitting: Family Medicine

## 2017-05-29 VITALS — BP 120/77 | HR 95 | Temp 98.2°F | Ht 64.0 in | Wt 214.8 lb

## 2017-05-29 DIAGNOSIS — Z79899 Other long term (current) drug therapy: Secondary | ICD-10-CM | POA: Insufficient documentation

## 2017-05-29 DIAGNOSIS — E118 Type 2 diabetes mellitus with unspecified complications: Secondary | ICD-10-CM

## 2017-05-29 DIAGNOSIS — IMO0002 Reserved for concepts with insufficient information to code with codable children: Secondary | ICD-10-CM

## 2017-05-29 DIAGNOSIS — E1165 Type 2 diabetes mellitus with hyperglycemia: Secondary | ICD-10-CM

## 2017-05-29 DIAGNOSIS — K529 Noninfective gastroenteritis and colitis, unspecified: Secondary | ICD-10-CM

## 2017-05-29 DIAGNOSIS — Z794 Long term (current) use of insulin: Secondary | ICD-10-CM

## 2017-05-29 DIAGNOSIS — Z9114 Patient's other noncompliance with medication regimen: Secondary | ICD-10-CM | POA: Insufficient documentation

## 2017-05-29 LAB — POCT GLYCOSYLATED HEMOGLOBIN (HGB A1C): Hemoglobin A1C: 11.9

## 2017-05-29 LAB — GLUCOSE, POCT (MANUAL RESULT ENTRY): POC Glucose: 318 mg/dl — AB (ref 70–99)

## 2017-05-29 MED ORDER — LOPERAMIDE HCL 2 MG PO TABS: 2.0000 mg | ORAL_TABLET | Freq: Four times a day (QID) | ORAL | 0 refills | Status: DC | PRN

## 2017-05-29 MED ORDER — METOCLOPRAMIDE HCL 10 MG PO TABS: 10.0000 mg | ORAL_TABLET | Freq: Three times a day (TID) | ORAL | 0 refills | Status: DC | PRN

## 2017-05-29 MED ORDER — PHENYLEPHRINE-WITCH HAZEL 0.25-50 % RE GEL: RECTAL | 0 refills | Status: DC

## 2017-05-29 MED FILL — METOCLOPRAMIDE 10 MG TABLET: 10 | 10 days supply | Qty: 30 | Fill #0

## 2017-05-29 MED FILL — ?LOPERAMIDE 2 MG CAPSULE: 2 | 7 days supply | Qty: 30 | Fill #0

## 2017-05-29 NOTE — Progress Notes (Signed)
Subjective:  Patient ID: Sarah Garrison, female    DOB: Sep 27, 1980  Age: 36 y.o. MRN: [id number]  CC: Abdominal Pain   HPI Sarah Garrison presents for GI complaints. Onset 1 day ago. Associated symptoms include loose stool, stomach cramping, nausea, and anal skin irritation. She reports using AD ointment, cool wipes, and Vaseline for symptoms with minimal relief. She reports 15 episodes of loose stools within the last 24 hours. She reports recently eating food prepared at a relatives house. She denies any new foods or close contacts with similar symptoms. She is able to tolerate liquid intake. She denies any vomiting, hemorrhoids, hematochezia, or melena. Hyperglycemia. She reports not taking metformin for 1 week due to GI symptoms.   Outpatient Medications Prior to Visit  Medication Sig Dispense Refill  . Blood Glucose Monitoring Suppl (TRUE METRIX METER) w/Device KIT Use as directed 1 kit 0  . gabapentin (NEURONTIN) 300 MG capsule Take 1 capsule (300 mg total) by mouth 2 (two) times daily. 180 capsule 3  . glucose blood (TRUE METRIX BLOOD GLUCOSE TEST) test strip Use as instructed 100 each 12  . insulin aspart (NOVOLOG) 100 UNIT/ML FlexPen Inject 10 Units into the skin 3 (three) times daily with meals. Take with largest meal. 6 mL 3  . LANTUS SOLOSTAR 100 UNIT/ML Solostar Pen Inject 30 Units into the skin 2 (two) times daily. 15 mL 0  . lisinopril (PRINIVIL,ZESTRIL) 10 MG tablet Take 1 tablet (10 mg total) by mouth daily. 90 tablet 3  . TRUEPLUS LANCETS 28G MISC Use as directed 100 each 12  . TRUEPLUS PEN NEEDLES 32G X 4 MM MISC Test 3x per day 100 each 5  . albuterol (PROVENTIL HFA;VENTOLIN HFA) 108 (90 Base) MCG/ACT inhaler Inhale 1-2 puffs into the lungs every 6 (six) hours as needed for wheezing or shortness of breath. (Patient not taking: Reported on 05/29/2017) 1 Inhaler 0  . ibuprofen (ADVIL,MOTRIN) 600 MG tablet Take 1 tablet (600 mg total) every 6 (six) hours as needed by  mouth. (Patient not taking: Reported on 05/29/2017) 30 tablet 0  . metFORMIN (GLUCOPHAGE-XR) 500 MG 24 hr tablet Take 500 mg by mouth 2 (two) times daily.     No facility-administered medications prior to visit.     ROS Review of Systems  Constitutional: Negative.   Respiratory: Negative.   Cardiovascular: Negative.   Gastrointestinal: Positive for abdominal pain, diarrhea and nausea. Negative for blood in stool.       Anal irritation   Objective:  BP 120/77   Pulse 95   Temp 98.2 F (36.8 C) (Oral)   Ht 5' 4"  (1.626 m)   Wt 214 lb 12.8 oz (97.4 kg)   SpO2 96%   BMI 36.87 kg/m   BP/Weight 05/29/2017 04/22/2017 35/59/7416  Systolic BP 384 536 468  Diastolic BP 77 90 86  Wt. (Lbs) 214.8 215 -  BMI 36.87 36.9 -     Physical Exam  Constitutional: She appears well-developed and well-nourished.  HENT:  Head: Normocephalic and atraumatic.  Right Ear: External ear normal.  Left Ear: External ear normal.  Nose: Nose normal.  Mouth/Throat: Oropharynx is clear and moist.  Eyes: Conjunctivae are normal.  Neck: Normal range of motion. Neck supple.  Cardiovascular: Normal rate, regular rhythm, normal heart sounds and intact distal pulses.  Pulmonary/Chest: Effort normal and breath sounds normal.  Abdominal: Soft. Bowel sounds are normal. She exhibits no mass. There is tenderness (generalized).  Lymphadenopathy:    She has no  cervical adenopathy.  Psychiatric: Her mood appears anxious. She expresses no homicidal and no suicidal ideation. She expresses no suicidal plans and no homicidal plans.  Nursing note and vitals reviewed.   Assessment & Plan:   1. Uncontrolled type 2 diabetes mellitus with complication, with long-term current use of insulin (HCC) -Current nonadherance with metformin. - POCT glycosylated hemoglobin (Hb A1C) - POCT glucose (manual entry)  2. Gastroenteritis, acute -Recommend clear liquid diet for 12 to 24 hours, gradually introduce solids into diet  with bland foods first. -Provided with work note. - metoCLOPramide (REGLAN) 10 MG tablet; Take 1 tablet (10 mg total) by mouth every 8 (eight) hours as needed for nausea.  Dispense: 30 tablet; Refill: 0 - loperamide (IMODIUM A-D) 2 MG tablet; Take 1 tablet (2 mg total) by mouth 4 (four) times daily as needed for diarrhea or loose stools.  Dispense: 30 tablet; Refill: 0 - Phenylephrine-Witch Hazel (HEMORRHOIDAL COOLING) 0.25-50 % GEL; Use as directed.  Dispense: 5.7 Tube; Refill: 0 - Ova and parasite examination; Future   Follow-up: Return if symptoms worsen or fail to improve.   Alfonse Spruce FNP

## 2017-05-29 NOTE — Patient Instructions (Signed)
Clear liquid diet first 12 to 24 hours, slowly introduce solid foods into diet.   Clear Liquid Diet A clear liquid diet means that you only have liquids that you can see through. You do not eat any food on this diet. Most people need to follow this diet for only a short time. What do I need to know about this diet?  A clear liquid is a liquid that you can see through when you hold it up to a light.  This diet does not give you all the nutrients that you need. Choose a variety of the liquids that your doctor says you can drink on this diet. That way, you will get as many nutrients as possible.  If you are not sure whether you can have certain items, ask your doctor. What can I have?  Water and flavored water.  Fruit juices that do not have pulp, such as cranberry juice and apple juice.  Tea and coffee without milk or cream.  Clear bouillon or broth.  Broth-based soups that have been strained.  Flavored gelatins.  Honey.  Sugar water.  Frozen ice or frozen ice pops that do not have any milk, yogurt, fruit pieces, or fruit pulp in them.  Clear sodas.  Clear sports drinks. The items listed above may not be a complete list of recommended liquids. Contact your food and nutrition expert (dietitian) for more options. What can I not have?  Juices that have pulp.  Milk.  Cream or cream-based soups.  Yogurt. The items listed above may not be a complete list of liquids to avoid. Contact your food and nutrition expert for more information. Summary  A clear liquid diet is a diet that includes only liquids that you can see through.  The goal of this diet is to help you recover.  Make sure to avoid liquids with milk, cream, or pulp while you are on this diet. This information is not intended to replace advice given to you by your health care provider. Make sure you discuss any questions you have with your health care provider. Document Released: 05/03/2008 Document Revised:  01/02/2016 Document Reviewed: 04/17/2013 Elsevier Interactive Patient Education  2017 Elsevier Inc.   [place] Diet A bland diet consists of foods that do not have a lot of fat or fiber. Foods without fat or fiber are easier for the body to digest. They are also less likely to irritate your mouth, throat, stomach, and other parts of your gastrointestinal tract. A bland diet is sometimes called a BRAT diet. What is my plan? Your health care provider or dietitian may recommend specific changes to your diet to prevent and treat your symptoms, such as:  Eating small meals often.  Cooking food until it is soft enough to chew easily.  Chewing your food well.  Drinking fluids slowly.  Not eating foods that are very spicy, sour, or fatty.  Not eating citrus fruits, such as oranges and grapefruit.  What do I need to know about this diet?  Eat a variety of foods from the bland diet food list.  Do not follow a bland diet longer than you have to.  Ask your health care provider whether you should take vitamins. What foods can I eat? Grains  Hot cereals, such as cream of wheat. Bread, crackers, or tortillas made from refined white flour. Rice. Vegetables Canned or cooked vegetables. Mashed or boiled potatoes. Fruits Bananas. Applesauce. Other types of cooked or canned fruit with the skin and seeds removed,  such as canned peaches or pears. Meats and Other Protein Sources Scrambled eggs. Creamy peanut butter or other nut butters. Lean, well-cooked meats, such as chicken or fish. Tofu. Soups or broths. Dairy Low-fat dairy products, such as milk, cottage cheese, or yogurt. Beverages Water. Herbal tea. Apple juice. Sweets and Desserts Pudding. Custard. Fruit gelatin. Ice cream. Fats and Oils Mild salad dressings. Canola or olive oil. The items listed above may not be a complete list of allowed foods or beverages. Contact your dietitian for more options. What foods are not  recommended? Foods and ingredients that are often not recommended include:  Spicy foods, such as hot sauce or salsa.  Fried foods.  Sour foods, such as pickled or fermented foods.  Raw vegetables or fruits, especially citrus or berries.  Caffeinated drinks.  Alcohol.  Strongly flavored seasonings or condiments.  The items listed above may not be a complete list of foods and beverages that are not allowed. Contact your dietitian for more information. This information is not intended to replace advice given to you by your health care provider. Make sure you discuss any questions you have with your health care provider. Document Released: 09/12/2015 Document Revised: 10/27/2015 Document Reviewed: 06/02/2014 Elsevier Interactive Patient Education  2018 [employer]

## 2017-06-05 NOTE — Addendum Note (Signed)
Addended by: Paschal DoppWHITE, Saloma Cadena J on: 06/05/2017 08:55 AM   Modules accepted: Orders

## 2017-06-10 LAB — OVA AND PARASITE EXAMINATION

## 2017-06-11 ENCOUNTER — Telehealth (INDEPENDENT_AMBULATORY_CARE_PROVIDER_SITE_OTHER): Payer: Self-pay | Admitting: *Deleted

## 2017-06-11 NOTE — Telephone Encounter (Signed)
-----   Message from Lizbeth BarkMandesia R Hairston, FNP sent at 06/10/2017  5:57 PM EST ----- Stool negative for parasites.

## 2017-06-11 NOTE — Telephone Encounter (Signed)
Medical Assistant left message on patient's home and cell voicemail. Voicemail states to give [facility] back [facility] [place] [phone]. Patient is aware of stool being negative for parasites.

## 2017-06-23 ENCOUNTER — Other Ambulatory Visit: Payer: Self-pay | Admitting: Internal Medicine

## 2017-06-25 MED ORDER — TRUE METRIX METER W/DEVICE KIT: PACK | 0 refills | Status: DC

## 2017-06-27 ENCOUNTER — Ambulatory Visit: Payer: Self-pay | Attending: Internal Medicine | Admitting: Physician Assistant

## 2017-06-27 VITALS — BP 131/86 | HR 110 | Temp 98.7°F | Resp 16 | Ht 64.0 in | Wt 216.4 lb

## 2017-06-27 DIAGNOSIS — K219 Gastro-esophageal reflux disease without esophagitis: Secondary | ICD-10-CM | POA: Insufficient documentation

## 2017-06-27 DIAGNOSIS — I10 Essential (primary) hypertension: Secondary | ICD-10-CM | POA: Insufficient documentation

## 2017-06-27 DIAGNOSIS — E1140 Type 2 diabetes mellitus with diabetic neuropathy, unspecified: Secondary | ICD-10-CM

## 2017-06-27 DIAGNOSIS — E1165 Type 2 diabetes mellitus with hyperglycemia: Secondary | ICD-10-CM | POA: Insufficient documentation

## 2017-06-27 DIAGNOSIS — Z794 Long term (current) use of insulin: Secondary | ICD-10-CM | POA: Insufficient documentation

## 2017-06-27 DIAGNOSIS — E118 Type 2 diabetes mellitus with unspecified complications: Secondary | ICD-10-CM

## 2017-06-27 DIAGNOSIS — Z79899 Other long term (current) drug therapy: Secondary | ICD-10-CM | POA: Insufficient documentation

## 2017-06-27 DIAGNOSIS — Z885 Allergy status to narcotic agent status: Secondary | ICD-10-CM | POA: Insufficient documentation

## 2017-06-27 DIAGNOSIS — R109 Unspecified abdominal pain: Secondary | ICD-10-CM | POA: Insufficient documentation

## 2017-06-27 DIAGNOSIS — J45909 Unspecified asthma, uncomplicated: Secondary | ICD-10-CM | POA: Insufficient documentation

## 2017-06-27 DIAGNOSIS — R112 Nausea with vomiting, unspecified: Secondary | ICD-10-CM | POA: Insufficient documentation

## 2017-06-27 DIAGNOSIS — IMO0002 Reserved for concepts with insufficient information to code with codable children: Secondary | ICD-10-CM

## 2017-06-27 LAB — GLUCOSE, POCT (MANUAL RESULT ENTRY)
POC Glucose: 347 mg/dl — AB (ref 70–99)
POC Glucose: 391 mg/dl — AB (ref 70–99)

## 2017-06-27 LAB — POCT URINALYSIS DIPSTICK
BILIRUBIN UA: NEGATIVE
Blood, UA: NEGATIVE
Glucose, UA: 500
Ketones, UA: NEGATIVE
Leukocytes, UA: NEGATIVE
Nitrite, UA: NEGATIVE
PH UA: 5 (ref 5.0–8.0)
Protein, UA: NEGATIVE
Spec Grav, UA: 1.015 (ref 1.010–1.025)
UROBILINOGEN UA: 0.2 U/dL

## 2017-06-27 LAB — POCT URINE PREGNANCY: Preg Test, Ur: NEGATIVE

## 2017-06-27 MED ORDER — ONDANSETRON HCL 8 MG PO TABS: 8.0000 mg | ORAL_TABLET | Freq: Three times a day (TID) | ORAL | 0 refills | Status: DC | PRN

## 2017-06-27 MED ORDER — INSULIN ASPART 100 UNIT/ML ~~LOC~~ SOLN
20.0000 [IU] | Freq: Once | SUBCUTANEOUS | Status: AC
  Administered 2017-06-27: 20 [IU] via SUBCUTANEOUS

## 2017-06-27 NOTE — Progress Notes (Signed)
pocPatient stated she have a hernia and it started to hurt to the point she vomited. Patient stated she vomit on Friday and Vomit blood on Saturday to Sunday.  Patient stated she felt weak and legs were numb.

## 2017-06-27 NOTE — Progress Notes (Signed)
Patient ID: Sarah Garrison, female   DOB: September 16, 1980, 37 y.o.   MRN: [id number]      Sarah Garrison, is a 37 y.o. female  QZE:[id number]  [id number]:[id number]  DOB - December 21, 1980  Subjective:  Chief Complaint and HPI: Sarah Garrison is a 37 y.o. female here today for Stomach pain and cramping and vomiting started Friday night.  Vomited about 3-4 times in 24 hours then vomited multiple times on Sunday and there was blood in the vomitus.  Vomiting started to get less and less. The last time she vomited was once yesterday.  She hasn't taken any meds this morning.  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   Pain was above the belly button.  No f/c.  She still has her gallbladder and appendix.  She is on Depo Provera fro BC.  She denies urinary symptoms.  She thinks there may have been blood in about 3 of the vomitus episodes.  Feels achy all over but starting to feel better.  Pain is also improving.    Not checking blood sugars.  Appetite is good now and she did eat breakfast this morning.  She did not take her lantus this morning.  ROS:   Constitutional:  No f/c, No night sweats, No unexplained weight loss. EENT:  No vision changes, No blurry vision, No hearing changes. No mouth, throat, or ear problems.  Respiratory: No cough, No SOB Cardiac: No CP, no palpitations GI:  + abd pain, No D/C. +N/V.  Both have resolved today.   GU: No Urinary s/sx Musculoskeletal: No joint pain Neuro: No headache, no dizziness, no motor weakness.  Skin: No rash Endocrine:  No polydipsia. No polyuria.  Psych: Denies SI/HI  No problems updated.  ALLERGIES: Allergies  Allergen Reactions  . Morphine And Related Shortness Of Breath  . Glyburide Diarrhea and Other (See Comments)    Reaction:  Nose bleeds   . Ivp Dye [Iodinated Diagnostic Agents]     Shortness of breath.    . Oxycodone Other (See Comments)    Pt states that this medication makes her "dream about rabbits chasing" her.    . Vicodin  [Hydrocodone-Acetaminophen] Other (See Comments)    Pt states that this medication makes her "dream about rabbits chasing" her.      PAST MEDICAL HISTORY: Past Medical History:  Diagnosis Date  . Asthma   . Diabetes mellitus without complication ([place])   . GERD (gastroesophageal reflux disease)   . Hypertension     MEDICATIONS AT HOME: Prior to Admission medications   Medication Sig Start Date End Date Taking? Authorizing Provider  Blood Glucose Monitoring Suppl (TRUE METRIX METER) w/Device KIT Use as directed 06/25/17  Yes Jegede, Olugbemiga E, MD  glucose blood (TRUE METRIX BLOOD GLUCOSE TEST) test strip Use as instructed 03/04/17  Yes Jegede, Olugbemiga E, MD  insulin aspart (NOVOLOG) 100 UNIT/ML FlexPen Inject 10 Units into the skin 3 (three) times daily with meals. Take with largest meal. 12/12/16  Yes Rowe Clack, MD  LANTUS SOLOSTAR 100 UNIT/ML Solostar Pen Inject 30 Units into the skin 2 (two) times daily. 12/12/16  Yes Rowe Clack, MD  lisinopril (PRINIVIL,ZESTRIL) 10 MG tablet Take 1 tablet (10 mg total) by mouth daily. 12/12/16  Yes Rowe Clack, MD  loperamide (IMODIUM A-D) 2 MG tablet Take 1 tablet (2 mg total) by mouth 4 (four) times daily as needed for diarrhea or loose stools. 05/29/17  Yes Alfonse Spruce, FNP  metoCLOPramide (  REGLAN) 10 MG tablet Take 1 tablet (10 mg total) by mouth every 8 (eight) hours as needed for nausea. 05/29/17  Yes Alfonse Spruce, FNP  TRUEPLUS LANCETS 28G MISC Use as directed 03/04/17  Yes Jegede, Olugbemiga E, MD  TRUEPLUS PEN NEEDLES 32G X 4 MM MISC Test 3x per day 02/11/17  Yes Jegede, Olugbemiga E, MD  albuterol (PROVENTIL HFA;VENTOLIN HFA) 108 (90 Base) MCG/ACT inhaler Inhale 1-2 puffs into the lungs every 6 (six) hours as needed for wheezing or shortness of breath. Patient not taking: Reported on 05/29/2017 03/30/17   Ocie Cornfield T, PA-C  gabapentin (NEURONTIN) 300 MG capsule Take 1 capsule (300 mg total) by  mouth 2 (two) times daily. Patient not taking: Reported on 06/27/2017 01/02/17   Tresa Garter, MD  ibuprofen (ADVIL,MOTRIN) 600 MG tablet Take 1 tablet (600 mg total) every 6 (six) hours as needed by mouth. Patient not taking: Reported on 05/29/2017 04/22/17   Melynda Ripple, MD  metFORMIN (GLUCOPHAGE-XR) 500 MG 24 hr tablet Take 500 mg by mouth 2 (two) times daily.    [provider]  ondansetron (ZOFRAN) 8 MG tablet Take 1 tablet (8 mg total) by mouth every 8 (eight) hours as needed for nausea or vomiting. 06/27/17   Thereasa Solo, Dionne Bucy, PA-C  Phenylephrine-Witch Hazel (HEMORRHOIDAL COOLING) 0.25-50 % GEL Use as directed. Patient not taking: Reported on 06/27/2017 05/29/17   Alfonse Spruce, FNP     Objective:  EXAM:   Vitals:   06/27/17 0839  BP: 131/86  Pulse: (!) 110  Resp: 16  Temp: 98.7 F (37.1 C)  TempSrc: Oral  SpO2: 99%  Weight: 216 lb 6.4 oz (98.2 kg)  Height: 5' 4"  (1.626 m)    General appearance : A&OX3. NAD. Non-toxic-appearing HEENT: Atraumatic and Normocephalic.  PERRLA. EOM intact.  TM clear B. Mouth-MMM, post pharynx WNL w/o erythema, No PND. Neck: supple, no JVD. No cervical lymphadenopathy. No thyromegaly Chest/Lungs:  Breathing-non-labored, Good air entry bilaterally, breath sounds normal without rales, rhonchi, or wheezing (Rate at 96 on exam) CVS: S1 S2 regular, no murmurs, gallops, rubs  Abdomen: Bowel sounds present, Non tender and not distended with no gaurding, rigidity or rebound. Extremities: Bilateral Lower Ext shows no edema, both legs are warm to touch with = pulse throughout Neurology:  CN II-XII grossly intact, Non focal.   Psych:  TP linear. J/I WNL. Normal speech. Appropriate eye contact and affect.  Skin:  No Rash  Data Review Lab Results  Component Value Date   HGBA1C 11.9 05/29/2017   HGBA1C 11.5 02/11/2017   HGBA1C 12.7 12/13/2016     Assessment & Plan   1. Nausea and vomiting, intractability of vomiting not  specified, unspecified vomiting type Likely resolving syndrome - CBC with Differential/Platelet - Comprehensive metabolic panel - POCT urine pregnancy - Urinalysis Dipstick - ondansetron (ZOFRAN) 8 MG tablet; Take 1 tablet (8 mg total) by mouth every 8 (eight) hours as needed for nausea or vomiting.  Dispense: 20 tablet; Refill: 0 OOW note X 2 days to recuperate, rehydrate and get blood sugars under control.    2. Stomach pain Likely secondary to viral syndrome.  I am unsure if she was vomiting blood the 2-3 times that she thinks she did bc she was also drinking pedialyte and gatorade.  Watch for any further episodes of vomitus that appears to have blood in it, and if she does, go to ED! To emergency room if vomiting returns, you think you are vomiting up  blood, you develop fever, or you have worsening abdominal pain. - CBC with Differential/Platelet - Comprehensive metabolic panel  3. Uncontrolled type 2 diabetes mellitus with complication, with long-term current use of insulin (HCC) Uncontrolled.  Continue current regimen - Glucose (CBG) - insulin aspart (novoLOG) injection 20 Units = 16 ounces of po H20 which she kept down without any problem.  I urged her to allow [place] to do IVF, but she refused.  She ensures me she will hydrate appropriately today.  Blood sugar actually went up in office.  She will take her lantus when she gets home and check her blood sugars every 3-4 hours  Patient have been counseled extensively about nutrition and exercise  Return in about 2 months (around 08/27/2017) for [facility];  DM and htn management.  The patient was given clear instructions to go to ER or return to medical center if symptoms don't improve, worsen or new problems develop. The patient verbalized understanding. The patient was told to call to get lab results if they haven't heard anything in the next week.     Freeman Caldron, PA-C [facility] [place],  [place]   06/27/2017, 9:06 AM

## 2017-06-27 NOTE — Patient Instructions (Signed)
To emergency room if vomiting returns, you think you are vomiting up blood, you develop fever, or you have worsening abdominal pain.

## 2017-06-28 ENCOUNTER — Telehealth: Payer: Self-pay

## 2017-06-28 LAB — CBC WITH DIFFERENTIAL/PLATELET
BASOS: 1 %
Basophils Absolute: 0 10*3/uL (ref 0.0–0.2)
EOS (ABSOLUTE): 0.1 10*3/uL (ref 0.0–0.4)
EOS: 1 %
HEMATOCRIT: 43.6 % (ref 34.0–46.6)
HEMOGLOBIN: 14.7 g/dL (ref 11.1–15.9)
IMMATURE GRANS (ABS): 0 10*3/uL (ref 0.0–0.1)
IMMATURE GRANULOCYTES: 0 %
Lymphocytes Absolute: 3.4 10*3/uL — ABNORMAL HIGH (ref 0.7–3.1)
Lymphs: 44 %
MCH: 30.1 pg (ref 26.6–33.0)
MCHC: 33.7 g/dL (ref 31.5–35.7)
MCV: 89 fL (ref 79–97)
MONOCYTES: 6 %
Monocytes Absolute: 0.5 10*3/uL (ref 0.1–0.9)
Neutrophils Absolute: 3.7 10*3/uL (ref 1.4–7.0)
Neutrophils: 48 %
Platelets: 311 10*3/uL (ref 150–379)
RBC: 4.88 x10E6/uL (ref 3.77–5.28)
RDW: 13 % (ref 12.3–15.4)
WBC: 7.7 10*3/uL (ref 3.4–10.8)

## 2017-06-28 LAB — COMPREHENSIVE METABOLIC PANEL
ALBUMIN: 4.3 g/dL (ref 3.5–5.5)
ALT: 11 IU/L (ref 0–32)
AST: 7 IU/L (ref 0–40)
Albumin/Globulin Ratio: 1.8 (ref 1.2–2.2)
Alkaline Phosphatase: 160 IU/L — ABNORMAL HIGH (ref 39–117)
BUN / CREAT RATIO: 22 (ref 9–23)
BUN: 16 mg/dL (ref 6–20)
Bilirubin Total: 0.2 mg/dL (ref 0.0–1.2)
CALCIUM: 9.7 mg/dL (ref 8.7–10.2)
CO2: 23 mmol/L (ref 20–29)
CREATININE: 0.74 mg/dL (ref 0.57–1.00)
Chloride: 98 mmol/L (ref 96–106)
GFR, EST AFRICAN AMERICAN: 121 mL/min/{1.73_m2} (ref >59)
GFR, EST NON AFRICAN AMERICAN: 105 mL/min/{1.73_m2} (ref >59)
GLOBULIN, TOTAL: 2.4 g/dL (ref 1.5–4.5)
Glucose: 376 mg/dL — ABNORMAL HIGH (ref 65–99)
Potassium: 4.1 mmol/L (ref 3.5–5.2)
SODIUM: 137 mmol/L (ref 134–144)
TOTAL PROTEIN: 6.7 g/dL (ref 6.0–8.5)

## 2017-06-28 NOTE — Telephone Encounter (Signed)
-----   Message from Anders SimmondsAngela M McClung, [place]PA-C sent at 06/28/2017 10:56 AM EST ----- Blood count is normal.  Kidney and liver function and electrolytes are ok.  Your blood sugar and alkaline phosphatase are elevated.  Take meds as directed and follow diabetic diet. We will recheck these at follow-up. Thanks, Georgian CoAngela McClung, PA-C

## 2017-06-28 NOTE — Telephone Encounter (Signed)
CMA called patient regarding lab result.  Patient did not answer and allow to leave detail message.  CMA left a detail message and call back number for patient.

## 2017-07-15 ENCOUNTER — Emergency Department (HOSPITAL_COMMUNITY): Payer: Medicaid Other

## 2017-07-15 ENCOUNTER — Encounter (HOSPITAL_COMMUNITY): Payer: Self-pay

## 2017-07-15 DIAGNOSIS — R0789 Other chest pain: Secondary | ICD-10-CM | POA: Insufficient documentation

## 2017-07-15 DIAGNOSIS — E1140 Type 2 diabetes mellitus with diabetic neuropathy, unspecified: Secondary | ICD-10-CM | POA: Insufficient documentation

## 2017-07-15 DIAGNOSIS — J45909 Unspecified asthma, uncomplicated: Secondary | ICD-10-CM | POA: Insufficient documentation

## 2017-07-15 DIAGNOSIS — I10 Essential (primary) hypertension: Secondary | ICD-10-CM | POA: Insufficient documentation

## 2017-07-15 DIAGNOSIS — Z794 Long term (current) use of insulin: Secondary | ICD-10-CM | POA: Insufficient documentation

## 2017-07-15 DIAGNOSIS — F17210 Nicotine dependence, cigarettes, uncomplicated: Secondary | ICD-10-CM | POA: Insufficient documentation

## 2017-07-15 DIAGNOSIS — Z79899 Other long term (current) drug therapy: Secondary | ICD-10-CM | POA: Insufficient documentation

## 2017-07-15 LAB — I-STAT TROPONIN, ED: TROPONIN I, POC: 0 ng/mL (ref 0.00–0.08)

## 2017-07-15 LAB — BASIC METABOLIC PANEL
ANION GAP: 9 (ref 5–15)
BUN: 11 mg/dL (ref 6–20)
CALCIUM: 9 mg/dL (ref 8.9–10.3)
CHLORIDE: 107 mmol/L (ref 101–111)
CO2: 18 mmol/L — AB (ref 22–32)
Creatinine, Ser: 0.59 mg/dL (ref 0.44–1.00)
GFR calc Af Amer: >60 mL/min (ref >60)
GFR calc non Af Amer: >60 mL/min (ref >60)
Glucose, Bld: 389 mg/dL — ABNORMAL HIGH (ref 65–99)
Potassium: 3.9 mmol/L (ref 3.5–5.1)
SODIUM: 134 mmol/L — AB (ref 135–145)

## 2017-07-15 LAB — CBC
HCT: 41.5 % (ref 36.0–46.0)
HEMOGLOBIN: 14.5 g/dL (ref 12.0–15.0)
MCH: 30.3 pg (ref 26.0–34.0)
MCHC: 34.9 g/dL (ref 30.0–36.0)
MCV: 86.8 fL (ref 78.0–100.0)
PLATELETS: 297 10*3/uL (ref 150–400)
RBC: 4.78 MIL/uL (ref 3.87–5.11)
RDW: 12 % (ref 11.5–15.5)
WBC: 8 10*3/uL (ref 4.0–10.5)

## 2017-07-15 LAB — I-STAT BETA HCG BLOOD, ED (MC, WL, AP ONLY)

## 2017-07-15 NOTE — ED Triage Notes (Signed)
Pt states that she woke up this am with chest and arm pain, she took gas x and aspirin with no relief, she states that the pain has become worse as the day went on Pt states that it feels like something is sitting on her chest

## 2017-07-16 ENCOUNTER — Encounter (HOSPITAL_COMMUNITY): Payer: Self-pay | Admitting: Emergency Medicine

## 2017-07-16 ENCOUNTER — Emergency Department (HOSPITAL_COMMUNITY)
Admission: EM | Admit: 2017-07-16 | Discharge: 2017-07-16 | Disposition: A | Discharge status: Home / Self Care | Payer: Medicaid Other | Attending: Emergency Medicine | Admitting: Emergency Medicine

## 2017-07-16 DIAGNOSIS — R0789 Other chest pain: Secondary | ICD-10-CM

## 2017-07-16 LAB — I-STAT TROPONIN, ED: Troponin i, poc: 0 ng/mL (ref 0.00–0.08)

## 2017-07-16 LAB — D-DIMER, QUANTITATIVE: D-Dimer, Quant: <0.27 ug/mL-FEU (ref 0.00–0.50)

## 2017-07-16 MED ORDER — GI COCKTAIL ~~LOC~~
30.0000 mL | Freq: Once | ORAL | Status: AC
  Administered 2017-07-16: 30 mL via ORAL
  Filled 2017-07-16: qty 30

## 2017-07-16 MED ORDER — METHOCARBAMOL 1000 MG/10ML IJ SOLN
1000.0000 mg | Freq: Once | INTRAVENOUS | Status: AC
  Administered 2017-07-16: 1000 mg via INTRAVENOUS
  Filled 2017-07-16: qty 10

## 2017-07-16 MED ORDER — NAPROXEN 375 MG PO TABS: 375.0000 mg | ORAL_TABLET | Freq: Two times a day (BID) | ORAL | 0 refills | Status: DC

## 2017-07-16 MED ORDER — KETOROLAC TROMETHAMINE 30 MG/ML IJ SOLN
15.0000 mg | Freq: Once | INTRAMUSCULAR | Status: AC
  Administered 2017-07-16: 15 mg via INTRAVENOUS
  Filled 2017-07-16: qty 1

## 2017-07-16 MED ORDER — METFORMIN HCL 500 MG PO TABS
500.0000 mg | ORAL_TABLET | Freq: Once | ORAL | Status: DC
  Filled 2017-07-16: qty 1

## 2017-07-16 MED ORDER — METHOCARBAMOL 500 MG PO TABS: 500.0000 mg | ORAL_TABLET | Freq: Two times a day (BID) | ORAL | 0 refills | Status: DC

## 2017-07-16 MED ORDER — METHOCARBAMOL 1000 MG/10ML IJ SOLN: 1000.0000 mg | Freq: Once | INTRAMUSCULAR | Status: DC

## 2017-07-16 NOTE — ED Provider Notes (Signed)
[place] DEPT Provider Note   CSN: [id number] Arrival date & time: 07/15/17  2127     History   Chief Complaint Chief Complaint  Patient presents with  . Chest Pain    HPI Sarah Garrison is a 37 y.o. female.  The history is provided by the patient.  Chest Pain   This is a new problem. The current episode started more than 1 week ago (worse x 24 hours). The problem occurs constantly. The problem has not changed since onset.The pain is associated with raising an arm and movement (palpation). The pain is present in the lateral region. The pain is severe. The quality of the pain is described as dull. The pain does not radiate. Exacerbated by: movement and palpation. Pertinent negatives include no abdominal pain, no back pain, no claudication, no cough, no diaphoresis, no dizziness, no exertional chest pressure, no fever, no headaches, no hemoptysis, no irregular heartbeat, no leg pain, no lower extremity edema, no malaise/fatigue, no nausea, no near-syncope, no numbness, no orthopnea, no palpitations, no PND, no shortness of breath, no sputum production, no syncope, no vomiting and no weakness. She has tried nothing for the symptoms. The treatment provided no relief. Risk factors include obesity.  Pertinent negatives for past medical history include no MI.  Pertinent negatives for family medical history include: no Marfan's syndrome.  Procedure history is negative for cardiac catheterization.    Past Medical History:  Diagnosis Date  . Asthma   . Diabetes mellitus without complication ([place])   . GERD (gastroesophageal reflux disease)   . Hypertension     Patient Active Problem List   Diagnosis Date Noted  . Uncontrolled type 2 diabetes mellitus with complication, with long-term current use of insulin ([place]) 12/13/2016  . Non-adherence to medical treatment 12/13/2016  . Asthma 07/30/2016  . 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/07/2016  . Encounter for pregnancy  test 02/01/2016  . Leg swelling 09/29/2015  . Morbid obesity, unspecified obesity type ([place]) 09/29/2015  . DM neuropathy, type II diabetes mellitus ([place]) 08/24/2015    Past Surgical History:  Procedure Laterality Date  . CESAREAN SECTION    . TUBAL LIGATION    . WISDOM TOOTH EXTRACTION      OB History    Gravida Para Term Preterm AB Living   5 4     1 4    SAB TAB Ectopic Multiple Live Births   1               Home Medications    Prior to Admission medications   Medication Sig Start Date End Date Taking? Authorizing Provider  insulin aspart (NOVOLOG) 100 UNIT/ML FlexPen Inject 10 Units into the skin 3 (three) times daily with meals. Take with largest meal. 12/12/16  Yes Rowe Clack, MD  LANTUS SOLOSTAR 100 UNIT/ML Solostar Pen Inject 30 Units into the skin 2 (two) times daily. 12/12/16  Yes Rowe Clack, MD  lisinopril (PRINIVIL,ZESTRIL) 10 MG tablet Take 1 tablet (10 mg total) by mouth daily. 12/12/16  Yes Rowe Clack, MD  albuterol (PROVENTIL HFA;VENTOLIN HFA) 108 (90 Base) MCG/ACT inhaler Inhale 1-2 puffs into the lungs every 6 (six) hours as needed for wheezing or shortness of breath. Patient not taking: Reported on 05/29/2017 03/30/17   Ocie Cornfield T, PA-C  Blood Glucose Monitoring Suppl (TRUE METRIX METER) w/Device KIT Use as directed 06/25/17   Tresa Garter, MD  gabapentin (NEURONTIN) 300 MG capsule Take 1 capsule (300 mg total) by mouth  2 (two) times daily. Patient not taking: Reported on 06/27/2017 01/02/17   Tresa Garter, MD  glucose blood (TRUE METRIX BLOOD GLUCOSE TEST) test strip Use as instructed 03/04/17   Tresa Garter, MD  ibuprofen (ADVIL,MOTRIN) 600 MG tablet Take 1 tablet (600 mg total) every 6 (six) hours as needed by mouth. Patient not taking: Reported on 05/29/2017 04/22/17   Melynda Ripple, MD  loperamide (IMODIUM A-D) 2 MG tablet Take 1 tablet (2 mg total) by mouth 4 (four) times daily as needed for diarrhea  or loose stools. Patient not taking: Reported on 07/16/2017 05/29/17   Alfonse Spruce, FNP  metFORMIN (GLUCOPHAGE-XR) 500 MG 24 hr tablet Take 500 mg by mouth 2 (two) times daily.    [provider]  methocarbamol (ROBAXIN) 500 MG tablet Take 1 tablet (500 mg total) by mouth 2 (two) times daily. 07/16/17   Tramon Crescenzo, MD  metoCLOPramide (REGLAN) 10 MG tablet Take 1 tablet (10 mg total) by mouth every 8 (eight) hours as needed for nausea. Patient not taking: Reported on 07/16/2017 05/29/17   Alfonse Spruce, FNP  naproxen (NAPROSYN) 375 MG tablet Take 1 tablet (375 mg total) by mouth 2 (two) times daily. 07/16/17   Pia Jedlicka, MD  ondansetron (ZOFRAN) 8 MG tablet Take 1 tablet (8 mg total) by mouth every 8 (eight) hours as needed for nausea or vomiting. Patient not taking: Reported on 07/16/2017 06/27/17   Argentina Donovan, PA-C  Phenylephrine-Witch Hazel (HEMORRHOIDAL COOLING) 0.25-50 % GEL Use as directed. Patient not taking: Reported on 06/27/2017 05/29/17   Alfonse Spruce, FNP  TRUEPLUS LANCETS 28G MISC Use as directed 03/04/17   Tresa Garter, MD  TRUEPLUS PEN NEEDLES 32G X 4 MM MISC Test 3x per day 02/11/17   Tresa Garter, MD    Family History Family History  Problem Relation Age of Onset  . Asthma Mother   . Asthma Father     Social History Social History   Tobacco Use  . Smoking status: Current Every Day Smoker    Packs/day: 0.10    Types: Cigarettes    Last attempt to quit: 11/02/2013    Years since quitting: 3.7  . Smokeless tobacco: Never Used  Substance Use Topics  . Alcohol use: No  . Drug use: No     Allergies   Morphine and related; Glyburide; Ivp dye [iodinated diagnostic agents]; Oxycodone; and Vicodin [hydrocodone-acetaminophen]   Review of Systems Review of Systems  Constitutional: Negative for diaphoresis, fever and malaise/fatigue.  Eyes: Negative for photophobia.  Respiratory: Negative for cough, hemoptysis,  sputum production and shortness of breath.   Cardiovascular: Positive for chest pain. Negative for palpitations, orthopnea, claudication, leg swelling, syncope, PND and near-syncope.  Gastrointestinal: Negative for abdominal pain, nausea and vomiting.  Musculoskeletal: Negative for back pain.  Neurological: Negative for dizziness, weakness, numbness and headaches.  All other systems reviewed and are negative.    Physical Exam Updated Vital Signs BP 114/78   Pulse 82   Temp 98.2 F (36.8 C) (Oral)   Resp (!) 27   Ht 5' 4"  (1.626 m)   Wt 98 kg (216 lb)   SpO2 98%   BMI 37.08 kg/m   Physical Exam  Constitutional: She is oriented to person, place, and time. She appears well-developed and well-nourished. No distress.  HENT:  Head: Normocephalic and atraumatic.  Mouth/Throat: No oropharyngeal exudate.  Eyes: Conjunctivae are normal. Pupils are equal, round, and reactive to light.  Neck:  Normal range of motion. Neck supple.  Cardiovascular: Normal rate, regular rhythm, normal heart sounds and intact distal pulses.  Pulmonary/Chest: Effort normal and breath sounds normal. No stridor. No respiratory distress. She has no wheezes. She has no rales. She exhibits tenderness.  Abdominal: Soft. Bowel sounds are normal. She exhibits no mass. There is no tenderness. There is no rebound and no guarding.  Musculoskeletal: Normal range of motion. She exhibits no edema or tenderness.  Lymphadenopathy:    She has no cervical adenopathy.  Neurological: She is alert and oriented to person, place, and time. She displays normal reflexes.  Skin: Skin is warm and dry. Capillary refill takes less than 2 seconds.  Psychiatric: She has a normal mood and affect.     ED Treatments / Results  Labs (all labs ordered are listed, but only abnormal results are displayed)  Results for orders placed or performed during the hospital encounter of 33/35/45  Basic metabolic panel  Result Value Ref Range   Sodium  134 (L) 135 - 145 mmol/L   Potassium 3.9 3.5 - 5.1 mmol/L   Chloride 107 101 - 111 mmol/L   CO2 18 (L) 22 - 32 mmol/L   Glucose, Bld 389 (H) 65 - 99 mg/dL   BUN 11 6 - 20 mg/dL   Creatinine, Ser 0.59 0.44 - 1.00 mg/dL   Calcium 9.0 8.9 - 10.3 mg/dL   GFR calc non Af Amer >60 >60 mL/min   GFR calc Af Amer >60 >60 mL/min   Anion gap 9 5 - 15  CBC  Result Value Ref Range   WBC 8.0 4.0 - 10.5 K/uL   RBC 4.78 3.87 - 5.11 MIL/uL   Hemoglobin 14.5 12.0 - 15.0 g/dL   HCT 41.5 36.0 - 46.0 %   MCV 86.8 78.0 - 100.0 fL   MCH 30.3 26.0 - 34.0 pg   MCHC 34.9 30.0 - 36.0 g/dL   RDW 12.0 11.5 - 15.5 %   Platelets 297 150 - 400 K/uL  D-dimer, quantitative (not at [facility])  Result Value Ref Range   D-Dimer, Quant <0.27 0.00 - 0.50 ug/mL-FEU  I-stat troponin, ED  Result Value Ref Range   Troponin i, poc 0.00 0.00 - 0.08 ng/mL   Comment 3          I-Stat beta hCG blood, ED  Result Value Ref Range   I-stat hCG, quantitative <5.0 <5 mIU/mL   Comment 3          I-stat troponin, ED  Result Value Ref Range   Troponin i, poc 0.00 0.00 - 0.08 ng/mL   Comment 3           Dg Chest 2 View  Result Date: 07/15/2017 CLINICAL DATA:  Acute onset of severe generalized chest and left arm pain. EXAM: CHEST  2 VIEW COMPARISON:  Chest radiograph performed 03/30/2017 FINDINGS: The lungs are well-aerated and clear. There is no evidence of focal opacification, pleural effusion or pneumothorax. The heart is normal in size; the mediastinal contour is within normal limits. No acute osseous abnormalities are seen. IMPRESSION: No acute cardiopulmonary process seen. Electronically Signed   By: Garald Balding M.D.   On: 07/15/2017 22:41    EKG  EKG Interpretation  Date/Time:  Monday July 15 2017 21:36:49 EST Ventricular Rate:  111 PR Interval:    QRS Duration: 81 QT Interval:  352 QTC Calculation: 479 R Axis:   99 Text Interpretation:  Sinus tachycardia Borderline right axis deviation Borderline prolonged  QT  interval Baseline wander in lead(s) V3 V4 V6 When compared with ECG of 03/30/2017, QT has lengthened Confirmed by Delora Fuel ([id number]) on 07/15/2017 11:43:32 PM       Radiology Dg Chest 2 View  Result Date: 07/15/2017 CLINICAL DATA:  Acute onset of severe generalized chest and left arm pain. EXAM: CHEST  2 VIEW COMPARISON:  Chest radiograph performed 03/30/2017 FINDINGS: The lungs are well-aerated and clear. There is no evidence of focal opacification, pleural effusion or pneumothorax. The heart is normal in size; the mediastinal contour is within normal limits. No acute osseous abnormalities are seen. IMPRESSION: No acute cardiopulmonary process seen. Electronically Signed   By: Garald Balding M.D.   On: 07/15/2017 22:41    Procedures Procedures (including critical care time)  Medications Ordered in ED Medications  metFORMIN (GLUCOPHAGE) tablet 500 mg (500 mg Oral Not Given 07/16/17 0230)  gi cocktail (Maalox,Lidocaine,Donnatal) (30 mLs Oral Given 07/16/17 0139)  ketorolac (TORADOL) 30 MG/ML injection 15 mg (15 mg Intravenous Given 07/16/17 0143)  methocarbamol (ROBAXIN) 1,000 mg in dextrose 5 % 50 mL IVPB (0 mg Intravenous Stopped 07/16/17 0257)      Final Clinical Impressions(s) / ED Diagnoses   Final diagnoses:  Chest wall pain   Ruled out for MI and PE in the ED.  Symptoms are reproducible and consistent with MSK pain.  HEART score is 1 patient is extremely low risk for MACE.  Follow up with your PMD for ongoing care.    Return for weakness, numbness, changes in vision or speech,  fevers > 100.4 unrelieved by medication, shortness of breath, intractable vomiting, or diarrhea, abdominal pain, Inability to tolerate liquids or food, cough, altered mental status or any concerns. No signs of systemic illness or infection. The patient is nontoxic-appearing on exam and vital signs are within normal limits.    I have reviewed the triage vital signs and the nursing notes. Pertinent labs  &imaging results that were available during my care of the patient were reviewed by me and considered in my medical decision making (see chart for details).  After history, exam, and medical workup I feel the patient has been appropriately medically screened and is safe for discharge home. Pertinent diagnoses were discussed with the patient. Patient was given return precautions.  ED Discharge Orders        Ordered    naproxen (NAPROSYN) 375 MG tablet  2 times daily     07/16/17 0322    methocarbamol (ROBAXIN) 500 MG tablet  2 times daily     07/16/17 0322       Jenniger Figiel, MD 07/16/17 [id number]

## 2017-07-24 ENCOUNTER — Encounter: Payer: Self-pay | Admitting: Internal Medicine

## 2017-07-24 ENCOUNTER — Ambulatory Visit: Payer: Self-pay | Attending: Internal Medicine | Admitting: Internal Medicine

## 2017-07-24 VITALS — BP 138/91 | HR 105 | Temp 98.1°F | Resp 12 | Wt 214.0 lb

## 2017-07-24 DIAGNOSIS — Z885 Allergy status to narcotic agent status: Secondary | ICD-10-CM | POA: Insufficient documentation

## 2017-07-24 DIAGNOSIS — Z3042 Encounter for surveillance of injectable contraceptive: Secondary | ICD-10-CM

## 2017-07-24 DIAGNOSIS — M79604 Pain in right leg: Secondary | ICD-10-CM

## 2017-07-24 DIAGNOSIS — K219 Gastro-esophageal reflux disease without esophagitis: Secondary | ICD-10-CM | POA: Insufficient documentation

## 2017-07-24 DIAGNOSIS — E118 Type 2 diabetes mellitus with unspecified complications: Secondary | ICD-10-CM

## 2017-07-24 DIAGNOSIS — F17210 Nicotine dependence, cigarettes, uncomplicated: Secondary | ICD-10-CM | POA: Insufficient documentation

## 2017-07-24 DIAGNOSIS — Z9889 Other specified postprocedural states: Secondary | ICD-10-CM | POA: Insufficient documentation

## 2017-07-24 DIAGNOSIS — E1165 Type 2 diabetes mellitus with hyperglycemia: Secondary | ICD-10-CM | POA: Insufficient documentation

## 2017-07-24 DIAGNOSIS — Z794 Long term (current) use of insulin: Secondary | ICD-10-CM | POA: Insufficient documentation

## 2017-07-24 DIAGNOSIS — Z9851 Tubal ligation status: Secondary | ICD-10-CM | POA: Insufficient documentation

## 2017-07-24 DIAGNOSIS — IMO0002 Reserved for concepts with insufficient information to code with codable children: Secondary | ICD-10-CM

## 2017-07-24 DIAGNOSIS — Z79899 Other long term (current) drug therapy: Secondary | ICD-10-CM | POA: Insufficient documentation

## 2017-07-24 DIAGNOSIS — I10 Essential (primary) hypertension: Secondary | ICD-10-CM | POA: Insufficient documentation

## 2017-07-24 DIAGNOSIS — M79605 Pain in left leg: Secondary | ICD-10-CM

## 2017-07-24 DIAGNOSIS — R079 Chest pain, unspecified: Secondary | ICD-10-CM | POA: Insufficient documentation

## 2017-07-24 DIAGNOSIS — E1140 Type 2 diabetes mellitus with diabetic neuropathy, unspecified: Secondary | ICD-10-CM | POA: Insufficient documentation

## 2017-07-24 LAB — POCT URINE PREGNANCY: PREG TEST UR: NEGATIVE

## 2017-07-24 LAB — GLUCOSE, POCT (MANUAL RESULT ENTRY): POC Glucose: 302 mg/dl — AB (ref 70–99)

## 2017-07-24 MED ORDER — MEDROXYPROGESTERONE ACETATE 150 MG/ML IM SUSP
150.0000 mg | Freq: Once | INTRAMUSCULAR | Status: AC
  Administered 2017-07-24: 150 mg via INTRAMUSCULAR

## 2017-07-24 MED ORDER — LISINOPRIL 10 MG PO TABS: 10.0000 mg | ORAL_TABLET | Freq: Every day | ORAL | 3 refills | Status: DC

## 2017-07-24 MED ORDER — GABAPENTIN 300 MG PO CAPS: 600.0000 mg | ORAL_CAPSULE | Freq: Three times a day (TID) | ORAL | 3 refills | Status: DC

## 2017-07-24 MED ORDER — DULAGLUTIDE 1.5 MG/0.5ML ~~LOC~~ SOAJ: 1.5000 mg | SUBCUTANEOUS | 3 refills | Status: DC

## 2017-07-24 MED ORDER — INSULIN ASPART 100 UNIT/ML FLEXPEN: 10.0000 [IU] | PEN_INJECTOR | Freq: Three times a day (TID) | SUBCUTANEOUS | 3 refills | Status: DC

## 2017-07-24 MED ORDER — METFORMIN HCL ER 500 MG PO TB24: 500.0000 mg | ORAL_TABLET | Freq: Two times a day (BID) | ORAL | 3 refills | Status: DC

## 2017-07-24 MED ORDER — OMEPRAZOLE 20 MG PO CPDR: 20.0000 mg | DELAYED_RELEASE_CAPSULE | Freq: Every day | ORAL | 3 refills | Status: DC

## 2017-07-24 MED ORDER — METHOCARBAMOL 500 MG PO TABS: 500.0000 mg | ORAL_TABLET | Freq: Two times a day (BID) | ORAL | 1 refills | Status: DC

## 2017-07-24 MED FILL — METFORMIN HCL ER 500 MG TAB: 500 | 30 days supply | Qty: 60 | Fill #0

## 2017-07-24 MED FILL — !HUMALOG 100 UNITS/ML KWIKP: 100 | 30 days supply | Qty: 9 | Fill #0

## 2017-07-24 MED FILL — ?OMEPRAZOLE DR 20MG CAPSULE: 20 | 30 days supply | Qty: 30 | Fill #0

## 2017-07-24 MED FILL — !TRULICITY 1.5 MG/0.5 ML PE: 1.5 | 28 days supply | Qty: 2 | Fill #0

## 2017-07-24 MED FILL — GABAPENTIN 300 MG CAPSULE: 300 | 30 days supply | Qty: 180 | Fill #0

## 2017-07-24 MED FILL — METHOCARBAMOL 500 MG TABLET: 500 | 15 days supply | Qty: 30 | Fill #0

## 2017-07-24 MED FILL — LISINOPRIL 10 MG TABS: 10 | 30 days supply | Qty: 30 | Fill #0

## 2017-07-24 NOTE — Patient Instructions (Signed)
Diabetes Mellitus and Nutrition When you have diabetes (diabetes mellitus), it is very important to have healthy eating habits because your blood sugar (glucose) levels are greatly affected by what you eat and drink. Eating healthy foods in the appropriate amounts, at about the same times every day, can help you:  Control your blood glucose.  Lower your risk of heart disease.  Improve your blood pressure.  Reach or maintain a healthy weight.  Every person with diabetes is different, and each person has different needs for a meal plan. Your health care provider may recommend that you work with a diet and nutrition specialist (dietitian) to make a meal plan that is best for you. Your meal plan may vary depending on factors such as:  The calories you need.  The medicines you take.  Your weight.  Your blood glucose, blood pressure, and cholesterol levels.  Your activity level.  Other health conditions you have, such as heart or kidney disease.  How do carbohydrates affect me? Carbohydrates affect your blood glucose level more than any other type of food. Eating carbohydrates naturally increases the amount of glucose in your blood. Carbohydrate counting is a method for keeping track of how many carbohydrates you eat. Counting carbohydrates is important to keep your blood glucose at a healthy level, especially if you use insulin or take certain oral diabetes medicines. It is important to know how many carbohydrates you can safely have in each meal. This is different for every person. Your dietitian can help you calculate how many carbohydrates you should have at each meal and for snack. Foods that contain carbohydrates include:  Bread, cereal, rice, pasta, and crackers.  Potatoes and corn.  Peas, beans, and lentils.  Milk and yogurt.  Fruit and juice.  Desserts, such as cakes, cookies, ice cream, and candy.  How does alcohol affect me? Alcohol can cause a sudden decrease in blood  glucose (hypoglycemia), especially if you use insulin or take certain oral diabetes medicines. Hypoglycemia can be a life-threatening condition. Symptoms of hypoglycemia (sleepiness, dizziness, and confusion) are similar to symptoms of having too much alcohol. If your health care provider says that alcohol is safe for you, follow these guidelines:  Limit alcohol intake to no more than 1 drink per day for nonpregnant women and 2 drinks per day for men. One drink equals 12 oz of beer, 5 oz of wine, or 1 oz of hard liquor.  Do not drink on an empty stomach.  Keep yourself hydrated with water, diet soda, or unsweetened iced tea.  Keep in mind that regular soda, juice, and other mixers may contain a lot of sugar and must be counted as carbohydrates.  What are tips for following this plan? Reading food labels  Start by checking the serving size on the label. The amount of calories, carbohydrates, fats, and other nutrients listed on the label are based on one serving of the food. Many foods contain more than one serving per package.  Check the total grams (g) of carbohydrates in one serving. You can calculate the number of servings of carbohydrates in one serving by dividing the total carbohydrates by 15. For example, if a food has 30 g of total carbohydrates, it would be equal to 2 servings of carbohydrates.  Check the number of grams (g) of saturated and trans fats in one serving. Choose foods that have low or no amount of these fats.  Check the number of milligrams (mg) of sodium in one serving. Most people   should limit total sodium intake to less than 2,300 mg per day.  Always check the nutrition information of foods labeled as "low-fat" or "nonfat". These foods may be higher in added sugar or refined carbohydrates and should be avoided.  Talk to your dietitian to identify your daily goals for nutrients listed on the label. Shopping  Avoid buying canned, premade, or processed foods. These  foods tend to be high in fat, sodium, and added sugar.  Shop around the outside edge of the grocery store. This includes fresh fruits and vegetables, bulk grains, fresh meats, and fresh dairy. Cooking  Use low-heat cooking methods, such as baking, instead of high-heat cooking methods like deep frying.  Cook using healthy oils, such as olive, canola, or sunflower oil.  Avoid cooking with butter, cream, or high-fat meats. Meal planning  Eat meals and snacks regularly, preferably at the same times every day. Avoid going long periods of time without eating.  Eat foods high in fiber, such as fresh fruits, vegetables, beans, and whole grains. Talk to your dietitian about how many servings of carbohydrates you can eat at each meal.  Eat 4-6 ounces of lean protein each day, such as lean meat, chicken, fish, eggs, or tofu. 1 ounce is equal to 1 ounce of meat, chicken, or fish, 1 egg, or 1/4 cup of tofu.  Eat some foods each day that contain healthy fats, such as avocado, nuts, seeds, and fish. Lifestyle   Check your blood glucose regularly.  Exercise at least 30 minutes 5 or more days each week, or as told by your health care provider.  Take medicines as told by your health care provider.  Do not use any products that contain nicotine or tobacco, such as cigarettes and e-cigarettes. If you need help quitting, ask your health care provider.  Work with a counselor or diabetes educator to identify strategies to manage stress and any emotional and social challenges. What are some questions to ask my health care provider?  Do I need to meet with a diabetes educator?  Do I need to meet with a dietitian?  What number can I call if I have questions?  When are the best times to check my blood glucose? Where to find more information:  American Diabetes Association: diabetes.org/food-and-fitness/food  Academy of Nutrition and Dietetics:  www.eatright.org/resources/health/diseases-and-conditions/diabetes  National Institute of Diabetes and Digestive and Kidney Diseases (NIH): www.niddk.nih.gov/health-information/diabetes/overview/diet-eating-physical-activity Summary  A healthy meal plan will help you control your blood glucose and maintain a healthy lifestyle.  Working with a diet and nutrition specialist (dietitian) can help you make a meal plan that is best for you.  Keep in mind that carbohydrates and alcohol have immediate effects on your blood glucose levels. It is important to count carbohydrates and to use alcohol carefully. This information is not intended to replace advice given to you by your health care provider. Make sure you discuss any questions you have with your health care provider. Document Released: 02/15/2005 Document Revised: 06/25/2016 Document Reviewed: 06/25/2016 Elsevier Interactive Patient Education  2018 Elsevier Inc.  

## 2017-07-24 NOTE — Progress Notes (Signed)
Follow up CBG Discuss being on insulin pump.

## 2017-07-24 NOTE — Progress Notes (Signed)
Subjective:  Patient ID: Sarah Garrison, female    DOB: 03/15/1981  Age: 37 y.o. MRN: [id number]  CC: Follow-up from ED visitation and chest pain  HPI Sarah Garrison is a 37 y/o female with medical history of Insulin-dependent DM, Asthma, and Obesity. She presents today for ED f/u regarding chest pain and chronic conditions.   Chest pain: Patient c/o persistent chest pain for 1-2 weeks (8/10). Reports the pain is constant across the sternum; radiates to her neck. She c/o a FB sensation in her throat and a burning sensation. She has been self-medicating with Zantac at home, with minimal relief. Reports nausea and vomiting with streaks of brown emesis. Denies sob, dizziness, lightheadedness, or near syncope at home. Reports the pain is reproducible with palpitation from the central chest into the neck. She was seen in the ER on 07/16/17 for the pain; was given Naproxen and Methocarbamol for treatment. She did not fill the prescriptions due to the cost. Patient works as a Theatre manager at [employer]; does consistent heavy lifting. She is requesting a work note for light duty.   DM: Patient states she is adherent with medication regimen. She works 3rd shift and only checks her blood glucose 2-3 times per day. Reports her blood glucose ranges from 250-429 mg/dL. Reports high fasting glucose, as high as 429 mg/dL upon awakening. Her current glucose in clinic is 302 mg/dL; she reports not eating today prior to visit. Reports pain in her bilateral shoulders with paresthesia in both hands. Reports paresthesia in her great toes, bilaterally. Denies loss of sensation. Reports visual disturbance with blurred and intermittent spotty vision, bilaterally. She is currently not followed by Opthalmology. She states she lost her Medicaid and can not see Ophthalmologist. She is not followed by Podiatry. She checks her feet daily; denies wounds or erythematous skin changes. Denies polyuria, polyphagia, and  polydipsia. Patient is requesting to be placed on a insulin pump for better control of her glucose. She reports difficulty at work with her manager when she ask for a 10-minute break to give herself insulin. She states that the insulin pump will allow her to go part-time at work.   HTN: Patient is intermittently adherent with medication regimen, Lisinopril. Reports headaches when her blood pressure is elevated as high as 153/130 and she has missed a dose. Her headaches are throbbing (6-8/10). Denies n/v, photophobia, or visual disturbance with her headaches. Reports resolution of headaches when she takes her Lisinopril and her blood pressure reduces. Denies palpitations, dyspnea on exertion, generalized weakness, fatigue, peripheral edema, or claudication.   Other: Patient requesting depot contraception.   Past Medical History:  Diagnosis Date  . Asthma   . Diabetes mellitus without complication ([place])   . GERD (gastroesophageal reflux disease)   . Hypertension    Past Surgical History:  Procedure Laterality Date  . CESAREAN SECTION    . TUBAL LIGATION    . WISDOM TOOTH EXTRACTION     Patient Active Problem List   Diagnosis Date Noted  . Uncontrolled type 2 diabetes mellitus with complication, with long-term current use of insulin ([place]) 12/13/2016  . Non-adherence to medical treatment 12/13/2016  . Asthma 07/30/2016  . 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/07/2016  . Encounter for pregnancy test 02/01/2016  . Leg swelling 09/29/2015  . Morbid obesity, unspecified obesity type ([place]) 09/29/2015  . DM neuropathy, type II diabetes mellitus ([place]) 08/24/2015   Social History   Socioeconomic History  . Marital status: Single    Spouse name:  Not on file  . Number of children: Not on file  . Years of education: Not on file  . Highest education level: Not on file  Social Needs  . Financial resource strain: Not on file  . Food insecurity - worry: Not on file  . Food insecurity - inability: Not on file    . Transportation needs - medical: Not on file  . Transportation needs - non-medical: Not on file  Occupational History  . Not on file  Tobacco Use  . Smoking status: Current Every Day Smoker    Packs/day: 0.10    Types: Cigarettes    Last attempt to quit: 11/02/2013    Years since quitting: 3.7  . Smokeless tobacco: Never Used  Substance and Sexual Activity  . Alcohol use: No  . Drug use: No  . Sexual activity: Yes    Birth control/protection: Surgical  Other Topics Concern  . Not on file  Social History Narrative  . Not on file    Outpatient Medications Prior to Visit  Medication Sig Dispense Refill  . albuterol (PROVENTIL HFA;VENTOLIN HFA) 108 (90 Base) MCG/ACT inhaler Inhale 1-2 puffs into the lungs every 6 (six) hours as needed for wheezing or shortness of breath. (Patient not taking: Reported on 05/29/2017) 1 Inhaler 0  . Blood Glucose Monitoring Suppl (TRUE METRIX METER) w/Device KIT Use as directed 1 kit 0  . glucose blood (TRUE METRIX BLOOD GLUCOSE TEST) test strip Use as instructed 100 each 12  . LANTUS SOLOSTAR 100 UNIT/ML Solostar Pen Inject 30 Units into the skin 2 (two) times daily. 15 mL 0  . loperamide (IMODIUM A-D) 2 MG tablet Take 1 tablet (2 mg total) by mouth 4 (four) times daily as needed for diarrhea or loose stools. (Patient not taking: Reported on 07/16/2017) 30 tablet 0  . metoCLOPramide (REGLAN) 10 MG tablet Take 1 tablet (10 mg total) by mouth every 8 (eight) hours as needed for nausea. (Patient not taking: Reported on 07/16/2017) 30 tablet 0  . ondansetron (ZOFRAN) 8 MG tablet Take 1 tablet (8 mg total) by mouth every 8 (eight) hours as needed for nausea or vomiting. (Patient not taking: Reported on 07/16/2017) 20 tablet 0  . Phenylephrine-Witch Hazel (HEMORRHOIDAL COOLING) 0.25-50 % GEL Use as directed. (Patient not taking: Reported on 06/27/2017) 5.7 Tube 0  . TRUEPLUS LANCETS 28G MISC Use as directed 100 each 12  . TRUEPLUS PEN NEEDLES 32G X 4 MM MISC Test 3x  per day 100 each 5  . gabapentin (NEURONTIN) 300 MG capsule Take 1 capsule (300 mg total) by mouth 2 (two) times daily. (Patient not taking: Reported on 06/27/2017) 180 capsule 3  . ibuprofen (ADVIL,MOTRIN) 600 MG tablet Take 1 tablet (600 mg total) every 6 (six) hours as needed by mouth. (Patient not taking: Reported on 05/29/2017) 30 tablet 0  . insulin aspart (NOVOLOG) 100 UNIT/ML FlexPen Inject 10 Units into the skin 3 (three) times daily with meals. Take with largest meal. 6 mL 3  . lisinopril (PRINIVIL,ZESTRIL) 10 MG tablet Take 1 tablet (10 mg total) by mouth daily. 90 tablet 3  . metFORMIN (GLUCOPHAGE-XR) 500 MG 24 hr tablet Take 500 mg by mouth 2 (two) times daily.    . methocarbamol (ROBAXIN) 500 MG tablet Take 1 tablet (500 mg total) by mouth 2 (two) times daily. 10 tablet 0  . naproxen (NAPROSYN) 375 MG tablet Take 1 tablet (375 mg total) by mouth 2 (two) times daily. 10 tablet 0   No  facility-administered medications prior to visit.    Allergies  Allergen Reactions  . Morphine And Related Shortness Of Breath  . Glyburide Diarrhea and Other (See Comments)    Reaction:  Nose bleeds   . Ivp Dye [Iodinated Diagnostic Agents]     Shortness of breath.    . Oxycodone Other (See Comments)    Pt states that this medication makes her "dream about rabbits chasing" her.    . Vicodin [Hydrocodone-Acetaminophen] Other (See Comments)    Pt states that this medication makes her "dream about rabbits chasing" her.     ROS Review of Systems  Constitutional: Negative for activity change, appetite change, chills, fatigue, fever and unexpected weight change.  HENT: Positive for sore throat. Negative for trouble swallowing and voice change.   Eyes: Negative for photophobia, pain and visual disturbance.  Respiratory: Negative for cough, chest tightness, shortness of breath and wheezing.   Cardiovascular: Positive for chest pain. Negative for palpitations and leg swelling.  Gastrointestinal:  Negative for abdominal pain, diarrhea, nausea and vomiting.  Endocrine: Negative for polydipsia, polyphagia and polyuria.  Musculoskeletal: Positive for myalgias and neck pain. Negative for arthralgias, back pain, gait problem and neck stiffness.  Skin: Negative for color change, rash and wound.  Neurological: Positive for numbness and headaches. Negative for dizziness, syncope, weakness and light-headedness.  Psychiatric/Behavioral: Negative for confusion, decreased concentration, dysphoric mood and sleep disturbance.  All other systems reviewed and are negative.  Objective:  BP (!) 138/91 (BP Location: Left Arm, Patient Position: Sitting, Cuff Size: Large)   Pulse (!) 105   Temp 98.1 F (36.7 C) (Oral)   Resp 12   Wt 214 lb (97.1 kg)   SpO2 99%   BMI 36.73 kg/m   BP/Weight 07/24/2017 07/16/2017 07/16/9415  Systolic BP 408 144 -  Diastolic BP 91 78 -  Wt. (Lbs) 214 - 216  BMI 36.73 37.08 -   Lab Results  Component Value Date   HGBA1C 11.9 05/29/2017   Lab Results  Component Value Date   POCGLU 302 (A) 07/24/2017   Lab Results  Component Value Date   CREATININE 0.59 07/15/2017   Physical Exam  Constitutional: She is oriented to person, place, and time. She appears well-developed and well-nourished. No distress.  HENT:  Head: Normocephalic and atraumatic.  Eyes: Conjunctivae are normal.  Neck: Normal range of motion and full passive range of motion without pain. Neck supple. No JVD present. Muscular tenderness present. No spinous process tenderness present. Carotid bruit is not present. Normal range of motion present.  Cardiovascular: Regular rhythm and normal heart sounds. Exam reveals no gallop and no friction rub.  No murmur heard. Pulses:      Radial pulses are 2+ on the right side, and 2+ on the left side.       Dorsalis pedis pulses are 2+ on the right side, and 2+ on the left side.  HR 91, on evaluation  Pulmonary/Chest: Effort normal and breath sounds normal. No  respiratory distress. She exhibits tenderness and bony tenderness. She exhibits no deformity.  Reproducible chest tenderness along the costosternal with light palpitation.   Abdominal: Soft. Bowel sounds are normal. She exhibits no distension. There is no tenderness.  Musculoskeletal: Normal range of motion. She exhibits no edema.  Lymphadenopathy:  Negative for head and cervical lymphadenopathy.  Neurological: She is alert and oriented to person, place, and time. She has normal strength and normal reflexes. No sensory deficit.  Skin: Skin is warm, dry and intact. No  rash noted.  Psychiatric: She has a normal mood and affect. Her speech is normal and behavior is normal. Judgment and thought content normal. Cognition and memory are normal.  Nursing note and vitals reviewed.  Assessment & Plan:   1. Uncontrolled type 2 diabetes mellitus with complication, with long-term current use of insulin ([place]), uncontrolled - In clinic glucose is 302 mg/dL; last A1c was 11.9. Patient reports being adherent with medication regimen. She reports high fasting glucose levels at home and visual disturbances.  - Initiated Trulicity 1.5 mg/week. Recommended she keep a log of her glucose levels.  - Instructed to maintain low-carb, low-sugar, low-fat, and low-salt diet.  - Instructed to maintain an exercise regimen with 150 min/week goal.  - Will continue to follow. Recommended Endocrinology referral, but the patient needs to reestablish her Medicaid to financial afford the consult.  - Instructed to f/u in 2 weeks with Marzetta Board (PharmD) for glucose check and medication management.  - Glucose (CBG)  - Dulaglutide 1.5 MG/0.5ML SOPN; Inject 1.5 mg into the skin once a week.  Dispense: 4 pen; Refill: 3 - gabapentin (NEURONTIN) 300 MG capsule; Take 2 capsules (600 mg total) by mouth 3 (three) times daily.  Dispense: 180 capsule; Refill: 3 - metFORMIN (GLUCOPHAGE-XR) 500 MG 24 hr tablet; Take 1 tablet (500 mg total) by mouth  2 (two) times daily.  Dispense: 180 tablet; Refill: 3 - insulin aspart (NOVOLOG) 100 UNIT/ML FlexPen; Inject 10 Units into the skin 3 (three) times daily with meals. Take with largest meal.  Dispense: 6 mL; Refill: 3  2. Depot contraception, stable - Patient provided birth control injection.  - POCT urine pregnancy - medroxyPROGESTERone (DEPO-PROVERA) injection 150 mg  3. Gastroesophageal reflux disease without esophagitis, uncontrolled - Patient is symptomatic for acid reflux. She has been self-medicating with OTC Zantac for relief.  - Initiated Omeprazole for GERD. Instructed to reduce spicy and greasy food intake.  - Will continue to monitor and f/u in 3 months.   - omeprazole (PRILOSEC) 20 MG capsule; Take 1 capsule (20 mg total) by mouth daily.  Dispense: 30 capsule; Refill: 3  4. Pain in both lower extremities, uncontrolled  - Patient reports persistent paresthesia and upper extremity pain. She performs repetitive heavy lifting at work with muscular strain.  - Continue medication regimen and increase Gabapentin to assist in neuropathic symptoms. - Will continue to monitor and f/u in 3 months.  - gabapentin (NEURONTIN) 300 MG capsule; Take 2 capsules (600 mg total) by mouth 3 (three) times daily.  Dispense: 180 capsule; Refill: 3 - methocarbamol (ROBAXIN) 500 MG tablet; Take 1 tablet (500 mg total) by mouth 2 (two) times daily.  Dispense: 30 tablet; Refill: 1  5. Essential hypertension, close to goal - Patient is intermittently adherent. In clinic BP is 138/91. She is asymptomatic, w/o headache, when she takes her Lisinopril.  - Will continue to monitor. Instructed to continue medication regimen.  - Instructed to maintain low-carb, low-sugar, low-fat, and low-salt diet.  - Instructed to maintain an exercise regimen with 150 min/week goal.  -F/u in 3 months.   - lisinopril (PRINIVIL,ZESTRIL) 10 MG tablet; Take 1 tablet (10 mg total) by mouth daily.  Dispense: 90 tablet; Refill:  3  Meds ordered this encounter  Medications  . medroxyPROGESTERone (DEPO-PROVERA) injection 150 mg  . omeprazole (PRILOSEC) 20 MG capsule    Sig: Take 1 capsule (20 mg total) by mouth daily.    Dispense:  30 capsule    Refill:  3  .  Dulaglutide 1.5 MG/0.5ML SOPN    Sig: Inject 1.5 mg into the skin once a week.    Dispense:  4 pen    Refill:  3  . gabapentin (NEURONTIN) 300 MG capsule    Sig: Take 2 capsules (600 mg total) by mouth 3 (three) times daily.    Dispense:  180 capsule    Refill:  3  . methocarbamol (ROBAXIN) 500 MG tablet    Sig: Take 1 tablet (500 mg total) by mouth 2 (two) times daily.    Dispense:  30 tablet    Refill:  1  . metFORMIN (GLUCOPHAGE-XR) 500 MG 24 hr tablet    Sig: Take 1 tablet (500 mg total) by mouth 2 (two) times daily.    Dispense:  180 tablet    Refill:  3  . lisinopril (PRINIVIL,ZESTRIL) 10 MG tablet    Sig: Take 1 tablet (10 mg total) by mouth daily.    Dispense:  90 tablet    Refill:  3  . insulin aspart (NOVOLOG) 100 UNIT/ML FlexPen    Sig: Inject 10 Units into the skin 3 (three) times daily with meals. Take with largest meal.    Dispense:  6 mL    Refill:  3    Follow-up: Return in about 2 weeks (around 08/07/2017) for with Stacy for blood glucose and Dulaglutide.   Krystle H. Hulan Fray, AGDNP-student  Evaluation and management procedures were performed by me with DNP Student in attendance, note written by DNP student under my supervision and collaboration. I have reviewed the note and I agree with the management and plan.   Angelica Chessman, MD, [place], [place], Karilyn Cota [facility] and [facility] [place], [place]   07/28/2017, 7:43 PM

## 2017-08-02 ENCOUNTER — Encounter (HOSPITAL_COMMUNITY): Payer: Self-pay | Admitting: Emergency Medicine

## 2017-08-02 ENCOUNTER — Emergency Department (HOSPITAL_COMMUNITY)
Admission: EM | Admit: 2017-08-02 | Discharge: 2017-08-02 | Disposition: A | Discharge status: Home / Self Care | Payer: Medicaid Other | Attending: Emergency Medicine | Admitting: Emergency Medicine

## 2017-08-02 ENCOUNTER — Other Ambulatory Visit: Payer: Self-pay

## 2017-08-02 DIAGNOSIS — R070 Pain in throat: Secondary | ICD-10-CM | POA: Insufficient documentation

## 2017-08-02 DIAGNOSIS — J45909 Unspecified asthma, uncomplicated: Secondary | ICD-10-CM | POA: Insufficient documentation

## 2017-08-02 DIAGNOSIS — F17210 Nicotine dependence, cigarettes, uncomplicated: Secondary | ICD-10-CM | POA: Insufficient documentation

## 2017-08-02 DIAGNOSIS — I10 Essential (primary) hypertension: Secondary | ICD-10-CM | POA: Insufficient documentation

## 2017-08-02 DIAGNOSIS — E119 Type 2 diabetes mellitus without complications: Secondary | ICD-10-CM | POA: Insufficient documentation

## 2017-08-02 DIAGNOSIS — J029 Acute pharyngitis, unspecified: Secondary | ICD-10-CM

## 2017-08-02 DIAGNOSIS — Z79899 Other long term (current) drug therapy: Secondary | ICD-10-CM | POA: Insufficient documentation

## 2017-08-02 DIAGNOSIS — Z794 Long term (current) use of insulin: Secondary | ICD-10-CM | POA: Insufficient documentation

## 2017-08-02 MED ORDER — GI COCKTAIL ~~LOC~~
30.0000 mL | Freq: Once | ORAL | Status: AC
  Administered 2017-08-02: 30 mL via ORAL
  Filled 2017-08-02: qty 30

## 2017-08-02 NOTE — ED Notes (Signed)
Discharge instructions reviewed with patient. Patient verbalizes understanding. VSS.   

## 2017-08-02 NOTE — ED Triage Notes (Signed)
Pt from home with c/o sore throat x 1 month. Pt states she has been screened by PCP and tested for strep. Pt states test was negative. Pt denies cough.

## 2017-08-02 NOTE — Discharge Instructions (Signed)
Schedule an appointment with your primary care provider to follow-up on your visit today. You can use mylanta or maalox over the counter for symptom relief.  Avoid spicy, acidic, or greasy foods, as these can upset your stomach and increase acid.

## 2017-08-02 NOTE — ED Provider Notes (Signed)
[facility] DEPT Provider Note   CSN: [id number] Arrival date & time: 08/02/17  4315     History   Chief Complaint Chief Complaint  Patient presents with  . Sore    HPI GIADA SCHOPPE is a 37 y.o. female with past medical history of GERD, asthma, type 2 diabetes, hypertension, presenting to the ED with 1 month of persistent sore throat.  Patient states she has a foreign body sensation when she swallows.  She has been evaluated by her PCP, who prescribed her Prilosec for GERD.  She states she has been taking it for 1 week without relief.  Has not tried any other medications for her symptoms other than Zantac.  Reports associated dysphasia, however denies difficulty swallowing or breathing, or URI sx.  The history is provided by the patient.    Past Medical History:  Diagnosis Date  . Asthma   . Diabetes mellitus without complication ([place])   . GERD (gastroesophageal reflux disease)   . Hypertension     Patient Active Problem List   Diagnosis Date Noted  . Uncontrolled type 2 diabetes mellitus with complication, with long-term current use of insulin ([place]) 12/13/2016  . Non-adherence to medical treatment 12/13/2016  . Asthma 07/30/2016  . 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/07/2016  . Encounter for pregnancy test 02/01/2016  . Leg swelling 09/29/2015  . Morbid obesity, unspecified obesity type ([place]) 09/29/2015  . DM neuropathy, type II diabetes mellitus ([place]) 08/24/2015    Past Surgical History:  Procedure Laterality Date  . CESAREAN SECTION    . TUBAL LIGATION    . WISDOM TOOTH EXTRACTION      OB History    Gravida Para Term Preterm AB Living   5 4     1 4    SAB TAB Ectopic Multiple Live Births   1               Home Medications    Prior to Admission medications   Medication Sig Start Date End Date Taking? Authorizing Provider  Dulaglutide 1.5 MG/0.5ML SOPN Inject 1.5 mg into the skin once a week. 07/24/17  Yes Tresa Garter, MD    gabapentin (NEURONTIN) 300 MG capsule Take 2 capsules (600 mg total) by mouth 3 (three) times daily. 07/24/17  Yes Jegede, Olugbemiga E, MD  insulin aspart (NOVOLOG) 100 UNIT/ML FlexPen Inject 10 Units into the skin 3 (three) times daily with meals. Take with largest meal. 07/24/17  Yes Jegede, Olugbemiga E, MD  LANTUS SOLOSTAR 100 UNIT/ML Solostar Pen Inject 30 Units into the skin 2 (two) times daily. 12/12/16  Yes Rowe Clack, MD  lisinopril (PRINIVIL,ZESTRIL) 10 MG tablet Take 1 tablet (10 mg total) by mouth daily. 07/24/17  Yes Tresa Garter, MD  loperamide (IMODIUM A-D) 2 MG tablet Take 1 tablet (2 mg total) by mouth 4 (four) times daily as needed for diarrhea or loose stools. 05/29/17  Yes Hairston, Maylon Peppers, FNP  metFORMIN (GLUCOPHAGE-XR) 500 MG 24 hr tablet Take 1 tablet (500 mg total) by mouth 2 (two) times daily. 07/24/17  Yes Tresa Garter, MD  methocarbamol (ROBAXIN) 500 MG tablet Take 1 tablet (500 mg total) by mouth 2 (two) times daily. 07/24/17  Yes Tresa Garter, MD  omeprazole (PRILOSEC) 20 MG capsule Take 1 capsule (20 mg total) by mouth daily. 07/24/17  Yes Tresa Garter, MD  albuterol (PROVENTIL HFA;VENTOLIN HFA) 108 (90 Base) MCG/ACT inhaler Inhale 1-2 puffs into the lungs every 6 (six) hours  as needed for wheezing or shortness of breath. Patient not taking: Reported on 05/29/2017 03/30/17   Ocie Cornfield T, PA-C  Blood Glucose Monitoring Suppl (TRUE METRIX METER) w/Device KIT Use as directed 06/25/17   Tresa Garter, MD  glucose blood (TRUE METRIX BLOOD GLUCOSE TEST) test strip Use as instructed 03/04/17   Tresa Garter, MD  metoCLOPramide (REGLAN) 10 MG tablet Take 1 tablet (10 mg total) by mouth every 8 (eight) hours as needed for nausea. Patient not taking: Reported on 07/16/2017 05/29/17   Alfonse Spruce, FNP  ondansetron (ZOFRAN) 8 MG tablet Take 1 tablet (8 mg total) by mouth every 8 (eight) hours as needed for nausea or  vomiting. Patient not taking: Reported on 07/16/2017 06/27/17   Argentina Donovan, PA-C  Phenylephrine-Witch Hazel (HEMORRHOIDAL COOLING) 0.25-50 % GEL Use as directed. Patient not taking: Reported on 06/27/2017 05/29/17   Alfonse Spruce, FNP  TRUEPLUS LANCETS 28G MISC Use as directed 03/04/17   Tresa Garter, MD  TRUEPLUS PEN NEEDLES 32G X 4 MM MISC Test 3x per day 02/11/17   Tresa Garter, MD    Family History Family History  Problem Relation Age of Onset  . Asthma Mother   . Asthma Father     Social History Social History   Tobacco Use  . Smoking status: Current Every Day Smoker    Packs/day: 0.10    Types: Cigarettes    Last attempt to quit: 11/02/2013    Years since quitting: 3.7  . Smokeless tobacco: Never Used  Substance Use Topics  . Alcohol use: No  . Drug use: No     Allergies   Morphine and related; Glyburide; Ivp dye [iodinated diagnostic agents]; Oxycodone; and Vicodin [hydrocodone-acetaminophen]   Review of Systems Review of Systems  Constitutional: Negative for fever.  HENT: Positive for sore throat. Negative for congestion, trouble swallowing and voice change.   Respiratory: Negative for cough.   All other systems reviewed and are negative.    Physical Exam Updated Vital Signs BP (!) 145/94 (BP Location: Left Arm)   Pulse (!) 115   Temp 98.2 F (36.8 C) (Oral)   Resp 17   SpO2 98%   Physical Exam  Constitutional: She appears well-developed and well-nourished. No distress.  Well-appearing, tolerating secretions.  HENT:  Head: Normocephalic and atraumatic.  Pharynx is mildly erythematous, no edema or exudates. uvula midline, no trismus.  Eyes: Conjunctivae are normal.  Neck: Normal range of motion. Neck supple. No tracheal deviation present. No thyromegaly present.  Cardiovascular: Intact distal pulses.  Pulmonary/Chest: [place] normal and breath sounds normal. No stridor. No respiratory distress.  Lymphadenopathy:    She has  no cervical adenopathy.  Psychiatric: She has a normal mood and affect. Her behavior is normal.  Nursing note and vitals reviewed.    ED Treatments / Results  Labs (all labs ordered are listed, but only abnormal results are displayed) Labs Reviewed - No data to display  EKG  EKG Interpretation None       Radiology No results found.  Procedures Procedures (including critical care time)  Medications Ordered in ED Medications  gi cocktail (Maalox,Lidocaine,Donnatal) (not administered)     Initial Impression / Assessment and Plan / ED Course  I have reviewed the triage vital signs and the nursing notes.  Pertinent labs & imaging results that were available during my care of the patient were reviewed by me and considered in my medical decision making (see chart for details).  Pt presenting with persistently sore throat x 56mo Diagnosed with GERD by PCP and prescribed omeprazole without relief. No URI sx. Reports strep swab done at PCP was neg. Pt is well-appearing, tolerating secretions.  Oropharynx is mildly erythematous without edema or exudates, no trismus.  Suspect symptoms secondary to reflux.  Treated symptoms in ED with GI cocktail, and recommend liquid antacid and PCP follow-up.  Well-appearing, safe for discharge.  Discussed results, findings, treatment and follow up. Patient advised of return precautions. Patient verbalized understanding and agreed with plan.  Final Clinical Impressions(s) / ED Diagnoses   Final diagnoses:  Sore throat    ED Discharge Orders    None       Robinson, JMartiniqueN, PA-C 08/02/17 [id number]   KVirgel Manifold MD 08/02/17 1357

## 2017-08-08 ENCOUNTER — Encounter: Payer: Self-pay | Admitting: Pharmacist

## 2017-08-08 ENCOUNTER — Ambulatory Visit: Payer: Self-pay | Attending: Internal Medicine | Admitting: Pharmacist

## 2017-08-08 DIAGNOSIS — Z794 Long term (current) use of insulin: Secondary | ICD-10-CM | POA: Insufficient documentation

## 2017-08-08 DIAGNOSIS — IMO0002 Reserved for concepts with insufficient information to code with codable children: Secondary | ICD-10-CM

## 2017-08-08 DIAGNOSIS — E1165 Type 2 diabetes mellitus with hyperglycemia: Secondary | ICD-10-CM | POA: Insufficient documentation

## 2017-08-08 DIAGNOSIS — E118 Type 2 diabetes mellitus with unspecified complications: Secondary | ICD-10-CM | POA: Insufficient documentation

## 2017-08-08 LAB — GLUCOSE, POCT (MANUAL RESULT ENTRY): POC GLUCOSE: 290 mg/dL — AB (ref 70–99)

## 2017-08-08 MED ORDER — LANTUS SOLOSTAR 100 UNIT/ML ~~LOC~~ SOPN: 34.0000 [IU] | PEN_INJECTOR | Freq: Two times a day (BID) | SUBCUTANEOUS | 2 refills | Status: DC

## 2017-08-08 NOTE — Patient Instructions (Addendum)
Thanks for coming to see [place]!  Increase Lantus to 34 units BID  Come back in 2 weeks   Hypoglycemia Hypoglycemia occurs when the level of sugar (glucose) in the blood is too low. Glucose is a type of sugar that provides the body's main source of energy. Certain hormones (insulin and glucagon) control the level of glucose in the blood. Insulin lowers blood glucose, and glucagon increases blood glucose. Hypoglycemia can result from having too much insulin in the bloodstream, or from not eating enough food that contains glucose. Hypoglycemia can happen in people who do or do not have diabetes. It can develop quickly, and it can be a medical emergency. What are the causes? Hypoglycemia occurs most often in people who have diabetes. If you have diabetes, hypoglycemia may be caused by:  Diabetes medicine.  Not eating enough, or not eating often enough.  Increased physical activity.  Drinking alcohol, especially when you have not eaten recently.  If you do not have diabetes, hypoglycemia may be caused by:  A tumor in the pancreas. The pancreas is the organ that makes insulin.  Not eating enough, or not eating for long periods at a time (fasting).  Severe infection or illness that affects the liver, heart, or kidneys.  Certain medicines.  You may also have reactive hypoglycemia. This condition causes hypoglycemia within 4 hours of eating a meal. This may occur after having stomach surgery. Sometimes, the cause of reactive hypoglycemia is not known. What increases the risk? Hypoglycemia is more likely to develop in:  People who have diabetes and take medicines to lower blood glucose.  People who abuse alcohol.  People who have a severe illness.  What are the signs or symptoms? Hypoglycemia may not cause any symptoms. If you have symptoms, they may include:  Hunger.  Anxiety.  Sweating and feeling clammy.  Confusion.  Dizziness or feeling  light-headed.  Sleepiness.  Nausea.  Increased heart rate.  Headache.  Blurry vision.  Seizure.  Nightmares.  Tingling or numbness around the mouth, lips, or tongue.  A change in speech.  Decreased ability to concentrate.  A change in coordination.  Restless sleep.  Tremors or shakes.  Fainting.  Irritability.  How is this diagnosed? Hypoglycemia is diagnosed with a blood test to measure your blood glucose level. This blood test is done while you are having symptoms. Your health care provider may also do a physical exam and review your medical history. If you do not have diabetes, other tests may be done to find the cause of your hypoglycemia. How is this treated? This condition can often be treated by immediately eating or drinking something that contains glucose, such as:  3-4 sugar tablets (glucose pills).  Glucose gel, 15-gram tube.  Fruit juice, 4 oz (120 mL).  Regular soda (not diet soda), 4 oz (120 mL).  Low-fat milk, 4 oz (120 mL).  Several pieces of hard candy.  Sugar or honey, 1 Tbsp.  Treating Hypoglycemia If You Have Diabetes  If you are alert and able to swallow safely, follow the 15:15 rule:  Take 15 grams of a rapid-acting carbohydrate. Rapid-acting options include: ? 1 tube of glucose gel. ? 3 glucose pills. ? 6-8 pieces of hard candy. ? 4 oz (120 mL) of fruit juice. ? 4 oz (120 ml) of regular (not diet) soda.  Check your blood glucose 15 minutes after you take the carbohydrate.  If the repeat blood glucose level is still at or below 70 mg/dL (3.9 mmol/L),  take 15 grams of a carbohydrate again.  If your blood glucose level does not increase above 70 mg/dL (3.9 mmol/L) after 3 tries, seek emergency medical care.  After your blood glucose level returns to normal, eat a meal or a snack within 1 hour.  Treating Severe Hypoglycemia Severe hypoglycemia is when your blood glucose level is at or below 54 mg/dL (3 mmol/L). Severe  hypoglycemia is an emergency. Do not wait to see if the symptoms will go away. Get medical help right away. Call your local emergency services (911 in the [place] Do not drive yourself to the hospital. If you have severe hypoglycemia and you cannot eat or drink, you may need an injection of glucagon. A family member or close friend should learn how to check your blood glucose and how to give you a glucagon injection. Ask your health care provider if you need to have an emergency glucagon injection kit available. Severe hypoglycemia may need to be treated in a hospital. The treatment may include getting glucose through an IV tube. You may also need treatment for the cause of your hypoglycemia. Follow these instructions at home: General instructions  Avoid any diets that cause you to not eat enough food. Talk with your health care provider before you start any new diet.  Take over-the-counter and prescription medicines only as told by your health care provider.  Limit alcohol intake to no more than 1 drink per day for nonpregnant women and 2 drinks per day for men. One drink equals 12 oz of beer, 5 oz of wine, or 1 oz of hard liquor.  Keep all follow-up visits as told by your health care provider. This is important. If You Have Diabetes:   Make sure you know the symptoms of hypoglycemia.  Always have a rapid-acting carbohydrate snack with you to treat low blood sugar.  Follow your diabetes management plan, as told by your health care provider. Make sure you: ? Take your medicines as directed. ? Follow your exercise plan. ? Follow your meal plan. Eat on time, and do not skip meals. ? Check your blood glucose as often as directed. Make sure to check your blood glucose before and after exercise. If you exercise longer or in a different way than usual, check your blood glucose more often. ? Follow your sick day plan whenever you cannot eat or drink normally. Make this plan in advance with your  health care provider.  Share your diabetes management plan with people in your workplace, school, and household.  Check your urine for ketones when you are ill and as told by your health care provider.  Carry a medical alert card or wear medical alert jewelry. If You Have Reactive Hypoglycemia or Low Blood Sugar From Other Causes:  Monitor your blood glucose as told by your health care provider.  Follow instructions from your health care provider about eating or drinking restrictions. Contact a health care provider if:  You have problems keeping your blood glucose in your target range.  You have frequent episodes of hypoglycemia. Get help right away if:  You continue to have hypoglycemia symptoms after eating or drinking something containing glucose.  Your blood glucose is at or below 54 mg/dL (3 mmol/L).  You have a seizure.  You faint. These symptoms may represent a serious problem that is an emergency. Do not wait to see if the symptoms will go away. Get medical help right away. Call your local emergency services (911 in the [place] Do  not drive yourself to the hospital. This information is not intended to replace advice given to you by your health care provider. Make sure you discuss any questions you have with your health care provider. Document Released: 05/21/2005 Document Revised: 11/02/2015 Document Reviewed: 06/24/2015 Elsevier Interactive Patient Education  [employer].

## 2017-08-08 NOTE — Progress Notes (Signed)
    S:     No chief complaint on file.   Patient arrives in good spirits.  Presents for diabetes evaluation, education, and management.  Patient reports adherence with medications.  Current diabetes medications include: Trulicity 1.5 mg weekly, Novolog 10 units TID, Lantus 30 units BID, metformin XR 500 mg BID  Patient denies hypoglycemic events.  Patient reported dietary habits: Eats 2 meals/day, usually breakfast and dinner. Ate [employer] leftovers before visit today (Fried fish, stuffed crab, and some soda)  Patient reported exercise habits: on her feet at work   Patient reports nocturia.  Patient reports neuropathy. Patient reports visual changes. Patient reports self foot exams.   She sleeps about 3 hours a day after she works her 3rd shift. She has difficulty sleeping. She reports that her job is strenuous and stressful. She still usually does not eat for about 8 hours during the day so does have true fasting blood sugars in the evening.   Patient wants to be checked for thyroid issues because the ED told her she might have a thyroid nodule and to follow up with PCP.    O:  Physical Exam   ROS   Lab Results  Component Value Date   HGBA1C 11.9 05/29/2017   There were no vitals filed for this visit.  Home fasting CBG: upper 200s 2 hour post-prandial/random CBG: 200s  POCT = 290 (post-prandial).  A/P: Diabetes longstanding currently uncontrolled based on A1c of 11.9. Patient denies hypoglycemic events and is able to verbalize appropriate hypoglycemia management plan. Patient reports adherence with medication. Control is suboptimal due to dietary indiscretion and sedentary lifestyle.  Increase Lantus to 34 units BID. Continue other medications as prescribed. I think the Trulicity has really helped her blood sugars since she has not been seeing 300s or 400s since starting. With fastings still in the 200s, hopefully we can get to goal with Lantus increase.  Appt  scheduled with walk in provider for next week to evaluate thyroid. Of note, thyroid was normal on CT May 2018 and the ED still believes her sx are due to GERD (see encounter 08/02/17)..   Next A1C anticipated April 2019.    Written patient instructions provided.  Total time in face to face counseling 15 minutes.   Follow up with walk in provider next week.   Patient seen with Mike CrazeShirley Guo, PharmD Candidate

## 2017-08-14 ENCOUNTER — Ambulatory Visit: Payer: Self-pay | Attending: Internal Medicine | Admitting: Physician Assistant

## 2017-08-14 VITALS — BP 131/88 | HR 106 | Temp 98.1°F | Resp 18 | Ht 60.0 in | Wt 215.0 lb

## 2017-08-14 DIAGNOSIS — R221 Localized swelling, mass and lump, neck: Secondary | ICD-10-CM

## 2017-08-14 DIAGNOSIS — J029 Acute pharyngitis, unspecified: Secondary | ICD-10-CM | POA: Insufficient documentation

## 2017-08-14 DIAGNOSIS — Z794 Long term (current) use of insulin: Secondary | ICD-10-CM | POA: Insufficient documentation

## 2017-08-14 DIAGNOSIS — K219 Gastro-esophageal reflux disease without esophagitis: Secondary | ICD-10-CM | POA: Insufficient documentation

## 2017-08-14 DIAGNOSIS — I10 Essential (primary) hypertension: Secondary | ICD-10-CM | POA: Insufficient documentation

## 2017-08-14 DIAGNOSIS — Z885 Allergy status to narcotic agent status: Secondary | ICD-10-CM | POA: Insufficient documentation

## 2017-08-14 DIAGNOSIS — Z79899 Other long term (current) drug therapy: Secondary | ICD-10-CM | POA: Insufficient documentation

## 2017-08-14 DIAGNOSIS — M5382 Other specified dorsopathies, cervical region: Secondary | ICD-10-CM | POA: Insufficient documentation

## 2017-08-14 DIAGNOSIS — R5383 Other fatigue: Secondary | ICD-10-CM | POA: Insufficient documentation

## 2017-08-14 DIAGNOSIS — J45909 Unspecified asthma, uncomplicated: Secondary | ICD-10-CM | POA: Insufficient documentation

## 2017-08-14 DIAGNOSIS — E1140 Type 2 diabetes mellitus with diabetic neuropathy, unspecified: Secondary | ICD-10-CM | POA: Insufficient documentation

## 2017-08-14 LAB — POCT GLYCOSYLATED HEMOGLOBIN (HGB A1C): Hemoglobin A1C: 11

## 2017-08-14 LAB — GLUCOSE, POCT (MANUAL RESULT ENTRY): POC GLUCOSE: 156 mg/dL — AB (ref 70–99)

## 2017-08-14 NOTE — Patient Instructions (Addendum)
Check blood sugars morning and at bedtime and record and bring to next visit.    Sore Throat When you have a sore throat, your throat may:  Hurt.  Burn.  Feel irritated.  Feel scratchy.  Many things can cause a sore throat, including:  An infection.  Allergies.  Dryness in the air.  Smoke or pollution.  Gastroesophageal reflux disease (GERD).  A tumor.  A sore throat can be the first sign of another sickness. It can happen with other problems, like coughing or a fever. Most sore throats go away without treatment. Follow these instructions at home:  Take over-the-counter medicines only as told by your doctor.  Drink enough fluids to keep your pee (urine) clear or pale yellow.  Rest when you feel you need to.  To help with pain, try: ? Sipping warm liquids, such as broth, herbal tea, or warm water. ? Eating or drinking cold or frozen liquids, such as frozen ice pops. ? Gargling with a salt-water mixture 3-4 times a day or as needed. To make a salt-water mixture, add -1 tsp of salt in 1 cup of warm water. Mix it until you cannot see the salt anymore. ? Sucking on hard candy or throat lozenges. ? Putting a cool-mist humidifier in your bedroom at night. ? Sitting in the bathroom with the door closed for 5-10 minutes while you run hot water in the shower.  Do not use any tobacco products, such as cigarettes, chewing tobacco, and e-cigarettes. If you need help quitting, ask your doctor. Contact a doctor if:  You have a fever for more than 2-3 days.  You keep having symptoms for more than 2-3 days.  Your throat does not get better in 7 days.  You have a fever and your symptoms suddenly get worse. Get help right away if:  You have trouble breathing.  You cannot swallow fluids, soft foods, or your saliva.  You have swelling in your throat or neck that gets worse.  You keep feeling like you are going to throw up (vomit).  You keep throwing up. This information  is not intended to replace advice given to you by your health care provider. Make sure you discuss any questions you have with your health care provider. Document Released: 02/28/2008 Document Revised: 01/15/2016 Document Reviewed: 03/11/2015 Elsevier Interactive Patient Education  [employer]2018 [employer]

## 2017-08-14 NOTE — Progress Notes (Signed)
Patient ID: Sarah Garrison, female   DOB: 1980/12/28, 37 y.o.   MRN: [id number]     Shawntavia Saunders, is a 37 y.o. female  MEB:[id number]  KGS:[id number]  DOB - [birth year]  Subjective:  Chief Complaint and HPI: Sarah Garrison is a 37 y.o. female here today Feels fullness in her neck and ST when swallowing.  This has been going on for about 6 weeks.  Reflux meds have not helped.  No change in appetite.  No f/c.  Feels tired and run down.  No N/V/D. She is concerned about her thyroid but had neg CT about 1 year ago. + fatigue.  No hair/skin changes.    Blood sugars not controlled.  Not checking sugars at home.  Not taking metformin regularly-only 50% of the time or less.    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 EENT:  No vision changes, No blurry vision, No hearing changes. No additional mouth, throat, or ear problems.  Respiratory: No cough, No SOB Cardiac: No CP, no palpitations GI:  No abd pain, No N/V/D. GU: No Urinary s/sx Musculoskeletal: No joint pain Neuro: No headache, no dizziness, no motor weakness.  Skin: No rash Endocrine:  No polydipsia. No polyuria.  Psych: Denies SI/HI  No problems updated.  ALLERGIES: Allergies  Allergen Reactions  . Morphine And Related Shortness Of Breath  . Glyburide Diarrhea and Other (See Comments)    Reaction:  Nose bleeds   . Ivp Dye [Iodinated Diagnostic Agents]     Shortness of breath.    . Oxycodone Other (See Comments)    Pt states that this medication makes her "dream about rabbits chasing" her.    . Vicodin [Hydrocodone-Acetaminophen] Other (See Comments)    Pt states that this medication makes her "dream about rabbits chasing" her.      PAST MEDICAL HISTORY: Past Medical History:  Diagnosis Date  . Asthma   . Diabetes mellitus without complication ([place])   . GERD (gastroesophageal reflux disease)   . Hypertension     MEDICATIONS AT HOME: Prior to  Admission medications   Medication Sig Start Date End Date Taking? Authorizing Provider  acetaminophen (TYLENOL) 500 MG tablet Take 1,000 mg by mouth daily as needed.    [provider]  albuterol (PROVENTIL HFA;VENTOLIN HFA) 108 (90 Base) MCG/ACT inhaler Inhale 1-2 puffs into the lungs every 6 (six) hours as needed for wheezing or shortness of breath. Patient not taking: Reported on 05/29/2017 03/30/17   Ocie Cornfield T, PA-C  Blood Glucose Monitoring Suppl (TRUE METRIX METER) w/Device KIT Use as directed 06/25/17   Tresa Garter, MD  Dulaglutide 1.5 MG/0.5ML SOPN Inject 1.5 mg into the skin once a week. 07/24/17   Tresa Garter, MD  gabapentin (NEURONTIN) 300 MG capsule Take 2 capsules (600 mg total) by mouth 3 (three) times daily. 07/24/17   Tresa Garter, MD  glucose blood (TRUE METRIX BLOOD GLUCOSE TEST) test strip Use as instructed 03/04/17   Tresa Garter, MD  ibuprofen (ADVIL,MOTRIN) 200 MG tablet Take 800 mg by mouth daily as needed.    [provider]  insulin aspart (NOVOLOG) 100 UNIT/ML FlexPen Inject 10 Units into the skin 3 (three) times daily with meals. Take with largest meal. 07/24/17   Jegede, Marlena Clipper, MD  LANTUS SOLOSTAR 100 UNIT/ML Solostar Pen Inject 34 Units into the skin 2 (two) times daily. 08/08/17   Angelica Chessman  E, MD  lisinopril (PRINIVIL,ZESTRIL) 10 MG tablet Take 1 tablet (10 mg total) by mouth daily. 07/24/17   Tresa Garter, MD  loperamide (IMODIUM A-D) 2 MG tablet Take 1 tablet (2 mg total) by mouth 4 (four) times daily as needed for diarrhea or loose stools. 05/29/17   Alfonse Spruce, FNP  metFORMIN (GLUCOPHAGE-XR) 500 MG 24 hr tablet Take 1 tablet (500 mg total) by mouth 2 (two) times daily. 07/24/17   Tresa Garter, MD  methocarbamol (ROBAXIN) 500 MG tablet Take 1 tablet (500 mg total) by mouth 2 (two) times daily. 07/24/17   Tresa Garter, MD  metoCLOPramide (REGLAN) 10 MG tablet Take 1  tablet (10 mg total) by mouth every 8 (eight) hours as needed for nausea. Patient not taking: Reported on 07/16/2017 05/29/17   Alfonse Spruce, FNP  Naproxen Sodium (ALEVE) 220 MG CAPS Take 440 mg by mouth daily as needed (PAIN).    [provider]  omeprazole (PRILOSEC) 20 MG capsule Take 1 capsule (20 mg total) by mouth daily. 07/24/17   Tresa Garter, MD  ondansetron (ZOFRAN) 8 MG tablet Take 1 tablet (8 mg total) by mouth every 8 (eight) hours as needed for nausea or vomiting. Patient not taking: Reported on 07/16/2017 06/27/17   Argentina Donovan, PA-C  Phenylephrine-Witch Hazel (HEMORRHOIDAL COOLING) 0.25-50 % GEL Use as directed. Patient not taking: Reported on 06/27/2017 05/29/17   Alfonse Spruce, FNP  TRUEPLUS LANCETS 28G MISC Use as directed 03/04/17   Tresa Garter, MD  TRUEPLUS PEN NEEDLES 32G X 4 MM MISC Test 3x per day 02/11/17   Tresa Garter, MD     Objective:  EXAM:   Vitals:   08/14/17 0850  BP: 131/88  Pulse: (!) 106  Resp: 18  Temp: 98.1 F (36.7 C)  TempSrc: Oral  SpO2: 99%  Weight: 215 lb (97.5 kg)  Height: 5' (1.524 m)  HC: 4" (10.2 cm)    General appearance : A&OX3. NAD. Non-toxic-appearing HEENT: Atraumatic and Normocephalic.  PERRLA. EOM intact.  TM clear B. Mouth-MMM, post pharynx WNL w/o erythema, + PND. Neck: supple, no JVD. No cervical lymphadenopathy. Neck is full but no gross  Thyromegaly or nodules.   Chest/Lungs:  Breathing-non-labored, Good air entry bilaterally, breath sounds normal without rales, rhonchi, or wheezing  CVS: S1 S2 regular, no murmurs, gallops, rubs  Extremities: Bilateral Lower Ext shows no edema, both legs are warm to touch with = pulse throughout Neurology:  CN II-XII grossly intact, Non focal.   Psych:  TP linear. J/I WNL. Normal speech. Appropriate eye contact and affect.  Skin:  No Rash  Data Review Lab Results  Component Value Date   HGBA1C 11.0 08/14/2017   HGBA1C 11.9 05/29/2017    HGBA1C 11.5 02/11/2017     Assessment & Plan   1. Neck fullness - Thyroid Panel With TSH - US Soft Tissue Head/Neck; Future  2. Type 2 diabetes mellitus with diabetic neuropathy, unspecified whether long term insulin use ([place]) Uncontrolled but not taking meds as prescribed.  Check blood sugars AT LEAST twice daily and record and bring to your next visit.   Make sure and take meds as directed.  Compliance imperative.  Take metformin esp.  - Glucose (CBG) - HgB A1c  3. 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    Patient have been counseled extensively about nutrition and exercise  Return  in about 6 weeks (around 09/25/2017) for assign her PCP and follow up DM.  The patient was given clear instructions to go to ER or return to medical center if symptoms don't improve, worsen or new problems develop. The patient verbalized understanding. The patient was told to call to get lab results if they haven't heard anything in the next week.     Freeman Caldron, PA-C [facility] [place], [place]   08/14/2017, 8:55 AM

## 2017-08-15 ENCOUNTER — Telehealth: Payer: Self-pay | Admitting: *Deleted

## 2017-08-15 LAB — THYROID PANEL WITH TSH
Free Thyroxine Index: 1.9 (ref 1.2–4.9)
T3 UPTAKE RATIO: 24 % (ref 24–39)
T4 TOTAL: 8 ug/dL (ref 4.5–12.0)
TSH: 2 u[IU]/mL (ref 0.450–4.500)

## 2017-08-15 LAB — MONO QUAL W/RFLX QN: Mono Qual W/Rflx Qn: NEGATIVE

## 2017-08-15 NOTE — Telephone Encounter (Signed)
Patient verified DOB Patient is aware of thyroid and mono being negative.Patient is going for xray of the throat on tomorrow. Patient had no further questions and will follow up as planned.

## 2017-08-15 NOTE — Telephone Encounter (Signed)
-----   Message from Anders SimmondsAngela M McClung, [place]-C sent at 08/15/2017 11:09 AM EDT ----- Your thyroid studies are perfect!  I also tested you for mono since your throat has been sore.  That was negative also. Follow-up as planned. Thanks, Georgian CoAngela McClung, PA-C

## 2017-08-16 ENCOUNTER — Ambulatory Visit (HOSPITAL_COMMUNITY)
Admission: RE | Admit: 2017-08-16 | Discharge: 2017-08-16 | Disposition: A | Discharge status: Home / Self Care | Payer: Self-pay | Source: Ambulatory Visit | Attending: Physician Assistant | Admitting: Physician Assistant

## 2017-08-16 ENCOUNTER — Encounter (HOSPITAL_COMMUNITY): Payer: Self-pay

## 2017-08-16 ENCOUNTER — Telehealth: Payer: Self-pay | Admitting: Internal Medicine

## 2017-08-16 DIAGNOSIS — R221 Localized swelling, mass and lump, neck: Secondary | ICD-10-CM

## 2017-08-16 DIAGNOSIS — J029 Acute pharyngitis, unspecified: Secondary | ICD-10-CM | POA: Insufficient documentation

## 2017-08-16 NOTE — Telephone Encounter (Signed)
Radiology called today to have the Ordered ultrasound changed to check her thyroid

## 2017-08-19 ENCOUNTER — Telehealth: Payer: Self-pay | Admitting: *Deleted

## 2017-08-19 NOTE — Telephone Encounter (Signed)
-----   Message from Anders SimmondsAngela M McClung, [place] sent at 08/19/2017  1:31 PM EDT ----- Your thyroid ultrasound was normal.  Thanks, Georgian CoAngela McClung, PA-C

## 2017-08-19 NOTE — Telephone Encounter (Signed)
Medical Assistant left message on patient's home and cell voicemail. Voicemail states to give [facility] [place] [facility] [place] [facility] [phone]. Patient is aware of thyroid US being normal and to follow up if sore throat persist and has not gotten any better.

## 2017-09-13 ENCOUNTER — Ambulatory Visit (HOSPITAL_COMMUNITY)
Admission: EM | Admit: 2017-09-13 | Discharge: 2017-09-13 | Disposition: A | Discharge status: Home / Self Care | Payer: Medicaid Other | Attending: Family Medicine | Admitting: Family Medicine

## 2017-09-13 ENCOUNTER — Encounter (HOSPITAL_COMMUNITY): Payer: Self-pay | Admitting: Emergency Medicine

## 2017-09-13 DIAGNOSIS — M79621 Pain in right upper arm: Secondary | ICD-10-CM | POA: Diagnosis not present

## 2017-09-13 DIAGNOSIS — L739 Follicular disorder, unspecified: Secondary | ICD-10-CM | POA: Diagnosis not present

## 2017-09-13 MED ORDER — SULFAMETHOXAZOLE-TRIMETHOPRIM 800-160 MG PO TABS: 1.0000 | ORAL_TABLET | Freq: Two times a day (BID) | ORAL | 0 refills | Status: AC

## 2017-09-13 MED FILL — SULFAMETHOXAZOLE-TMP DS TAB: 800-160 | 7 days supply | Qty: 14 | Fill #0

## 2017-09-13 NOTE — ED Provider Notes (Signed)
[place]    CSN: [id number] Arrival date & time: 09/13/17  7371     History   Chief Complaint No chief complaint on file.   HPI Sarah Garrison is a 37 y.o. female.   37 yo female here for pain under her right arm pit which she thinks is an abscess. She has had abscess drained under left armpit previously. Pain started a couple days ago. Nothing makes better or worse. Pain makes sleep difficult. She has not taken any pain medication but she has tried warm compress and this helps some. Denies fever, sweats, chills.      Past Medical History:  Diagnosis Date  . Asthma   . Diabetes mellitus without complication ([place])   . GERD (gastroesophageal reflux disease)   . Hypertension     Patient Active Problem List   Diagnosis Date Noted  . Uncontrolled type 2 diabetes mellitus with complication, with long-term current use of insulin ([place]) 12/13/2016  . Non-adherence to medical treatment 12/13/2016  . Asthma 07/30/2016  . 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/07/2016  . Encounter for pregnancy test 02/01/2016  . Leg swelling 09/29/2015  . Morbid obesity, unspecified obesity type ([place]) 09/29/2015  . DM neuropathy, type II diabetes mellitus ([place]) 08/24/2015    Past Surgical History:  Procedure Laterality Date  . CESAREAN SECTION    . TUBAL LIGATION    . WISDOM TOOTH EXTRACTION      OB History    Gravida  5   Para  4   Term      Preterm      AB  1   Living  4     SAB  1   TAB      Ectopic      Multiple      Live Births               Home Medications    Prior to Admission medications   Medication Sig Start Date End Date Taking? Authorizing Provider  acetaminophen (TYLENOL) 500 MG tablet Take 1,000 mg by mouth daily as needed.    [provider]  albuterol (PROVENTIL HFA;VENTOLIN HFA) 108 (90 Base) MCG/ACT inhaler Inhale 1-2 puffs into the lungs every 6 (six) hours as needed for wheezing or shortness of breath. Patient not taking:  Reported on 05/29/2017 03/30/17   Ocie Cornfield T, PA-C  Blood Glucose Monitoring Suppl (TRUE METRIX METER) w/Device KIT Use as directed 06/25/17   Tresa Garter, MD  Dulaglutide 1.5 MG/0.5ML SOPN Inject 1.5 mg into the skin once a week. 07/24/17   Tresa Garter, MD  gabapentin (NEURONTIN) 300 MG capsule Take 2 capsules (600 mg total) by mouth 3 (three) times daily. 07/24/17   Tresa Garter, MD  glucose blood (TRUE METRIX BLOOD GLUCOSE TEST) test strip Use as instructed 03/04/17   Tresa Garter, MD  ibuprofen (ADVIL,MOTRIN) 200 MG tablet Take 800 mg by mouth daily as needed.    [provider]  insulin aspart (NOVOLOG) 100 UNIT/ML FlexPen Inject 10 Units into the skin 3 (three) times daily with meals. Take with largest meal. 07/24/17   Jegede, Marlena Clipper, MD  LANTUS SOLOSTAR 100 UNIT/ML Solostar Pen Inject 34 Units into the skin 2 (two) times daily. 08/08/17   Tresa Garter, MD  lisinopril (PRINIVIL,ZESTRIL) 10 MG tablet Take 1 tablet (10 mg total) by mouth daily. 07/24/17   Tresa Garter, MD  loperamide (IMODIUM A-D) 2 MG tablet Take 1 tablet (2 mg  total) by mouth 4 (four) times daily as needed for diarrhea or loose stools. 05/29/17   Alfonse Spruce, FNP  metFORMIN (GLUCOPHAGE-XR) 500 MG 24 hr tablet Take 1 tablet (500 mg total) by mouth 2 (two) times daily. 07/24/17   Tresa Garter, MD  methocarbamol (ROBAXIN) 500 MG tablet Take 1 tablet (500 mg total) by mouth 2 (two) times daily. 07/24/17   Tresa Garter, MD  metoCLOPramide (REGLAN) 10 MG tablet Take 1 tablet (10 mg total) by mouth every 8 (eight) hours as needed for nausea. Patient not taking: Reported on 07/16/2017 05/29/17   Alfonse Spruce, FNP  Naproxen Sodium (ALEVE) 220 MG CAPS Take 440 mg by mouth daily as needed (PAIN).    [provider]  omeprazole (PRILOSEC) 20 MG capsule Take 1 capsule (20 mg total) by mouth daily. 07/24/17   Tresa Garter, MD    ondansetron (ZOFRAN) 8 MG tablet Take 1 tablet (8 mg total) by mouth every 8 (eight) hours as needed for nausea or vomiting. Patient not taking: Reported on 07/16/2017 06/27/17   Argentina Donovan, PA-C  Phenylephrine-Witch Hazel (HEMORRHOIDAL COOLING) 0.25-50 % GEL Use as directed. Patient not taking: Reported on 06/27/2017 05/29/17   Alfonse Spruce, FNP  TRUEPLUS LANCETS 28G MISC Use as directed 03/04/17   Tresa Garter, MD  TRUEPLUS PEN NEEDLES 32G X 4 MM MISC Test 3x per day 02/11/17   Tresa Garter, MD    Family History Family History  Problem Relation Age of Onset  . Asthma Mother   . Asthma Father     Social History Social History   Tobacco Use  . Smoking status: Current Every Day Smoker    Packs/day: 0.10    Types: Cigarettes    Last attempt to quit: 11/02/2013    Years since quitting: 3.8  . Smokeless tobacco: Never Used  Substance Use Topics  . Alcohol use: No  . Drug use: No     Allergies   Morphine and related; Glyburide; Ivp dye [iodinated diagnostic agents]; Oxycodone; and Vicodin [hydrocodone-acetaminophen]   Review of Systems Review of Systems  Constitutional: Negative for activity change and fever.  HENT: Negative for congestion and ear pain.   Eyes: Negative for discharge and itching.  Respiratory: Negative for apnea and chest tightness.   Gastrointestinal: Negative for abdominal distention and abdominal pain.  Endocrine: Negative for cold intolerance and heat intolerance.  Genitourinary: Negative for difficulty urinating and dyspareunia.  Musculoskeletal: Negative for arthralgias and back pain.  Skin:       abscess  Neurological: Negative for dizziness and headaches.  Hematological: Negative for adenopathy. Does not bruise/bleed easily.     Physical Exam Triage Vital Signs ED Triage Vitals  Enc Vitals Group     BP      Pulse      Resp      Temp      Temp src      SpO2      Weight      Height      Head Circumference       Peak Flow      Pain Score      Pain Loc      Pain Edu?      Excl. in [place]?    No data found.  Updated Vital Signs There were no vitals taken for this visit.  Visual Acuity Right Eye Distance:   Left Eye Distance:   Bilateral Distance:  Right Eye Near:   Left Eye Near:    Bilateral Near:     Physical Exam  Constitutional: She is oriented to person, place, and time. She appears well-developed and well-nourished.  HENT:  Head: Normocephalic and atraumatic.  Eyes: Pupils are equal, round, and reactive to light. EOM are normal.  Neck: Normal range of motion. Neck supple.  Cardiovascular: Normal rate and intact distal pulses.  Pulmonary/Chest: Effort normal. No respiratory distress.  Musculoskeletal: Normal range of motion. She exhibits no edema.  Neurological: She is alert and oriented to person, place, and time.  Skin:  Right armpit: extremely tender to palpation and some erythema overlying skin. There is some collection of fluid underlying the tenderness but it is not taut enough to drain  Psychiatric: She has a normal mood and affect. Her behavior is normal.     UC Treatments / Results  Labs (all labs ordered are listed, but only abnormal results are displayed) Labs Reviewed - No data to display  EKG None Radiology No results found.  Procedures Procedures (including critical care time)  Medications Ordered in UC Medications - No data to display   Initial Impression / Assessment and Plan / UC Course  I have reviewed the triage vital signs and the nursing notes.  Pertinent labs & imaging results that were available during my care of the patient were reviewed by me and considered in my medical decision making (see chart for details).     1. Folliculitis- may be abscess starting since extremely tender. However I cannot successfully drain fluid at this time. Will trial antibiotics and continue warm compress. Return if fever or worsening symptoms. Do not shave the  area.   Final Clinical Impressions(s) / UC Diagnoses   Final diagnoses:  None    ED Discharge Orders    None       Controlled Substance Prescriptions [place] Controlled Substance Registry consulted? Not Applicable   Dannielle Huh, DO 09/13/17 1015

## 2017-09-13 NOTE — ED Triage Notes (Signed)
Pt c/o abscess under R armpit x3 days.

## 2017-09-19 ENCOUNTER — Telehealth: Payer: Self-pay | Admitting: Internal Medicine

## 2017-09-19 NOTE — Telephone Encounter (Signed)
Will route to Dr. Alvis LemmingsNewlin

## 2017-09-19 NOTE — Telephone Encounter (Signed)
Nastatin powder and a single use pill to clear out a yeast infection. Patient was informed pcp is no longer seeing patients and she may have to come in for an office visit. Patient requested to atleast try. Please fu at your earliest convenience. [facility]

## 2017-09-19 NOTE — Telephone Encounter (Signed)
Pt called again to check on her Nastatin. Please follow up.

## 2017-09-23 MED ORDER — FLUCONAZOLE 150 MG PO TABS: 150.0000 mg | ORAL_TABLET | Freq: Once | ORAL | 0 refills | Status: AC

## 2017-09-23 MED ORDER — NYSTATIN 100000 UNIT/GM EX POWD: Freq: Three times a day (TID) | CUTANEOUS | 0 refills | Status: DC

## 2017-09-23 MED FILL — NYSTOP 100,000 UNITS/GM PWD: 100000 | 15 days supply | Qty: 45 | Fill #0

## 2017-09-23 MED FILL — FLUCONAZOLE 150 MG TABLET: 150 | 1 days supply | Qty: 1 | Fill #0

## 2017-09-23 NOTE — Telephone Encounter (Signed)
Done

## 2017-09-23 NOTE — Telephone Encounter (Signed)
Patient was called and informed of lab results. 

## 2017-10-08 ENCOUNTER — Ambulatory Visit: Payer: Medicaid Other | Attending: Family Medicine | Admitting: *Deleted

## 2017-10-08 DIAGNOSIS — Z309 Encounter for contraceptive management, unspecified: Secondary | ICD-10-CM | POA: Diagnosis not present

## 2017-10-08 DIAGNOSIS — Z3042 Encounter for surveillance of injectable contraceptive: Secondary | ICD-10-CM | POA: Diagnosis not present

## 2017-10-08 MED ORDER — MEDROXYPROGESTERONE ACETATE 150 MG/ML IM SUSP
150.0000 mg | Freq: Once | INTRAMUSCULAR | Status: AC
  Administered 2017-10-08: 150 mg via INTRAMUSCULAR

## 2017-10-08 NOTE — Progress Notes (Signed)
Date last pap:  Last Depo-Provera: 07/24/2017. Side Effects if any: n/a. Serum HCG indicated? no. Depo-Provera 150 mg IM given in Right upper outer quadrant. Next appointment due July 23-Aug 6.

## 2017-10-16 ENCOUNTER — Ambulatory Visit: Payer: Medicaid Other | Attending: Family Medicine | Admitting: Physician Assistant

## 2017-10-16 VITALS — BP 138/89 | HR 97 | Temp 98.9°F | Resp 16 | Ht 60.0 in | Wt 217.4 lb

## 2017-10-16 DIAGNOSIS — E1165 Type 2 diabetes mellitus with hyperglycemia: Secondary | ICD-10-CM

## 2017-10-16 DIAGNOSIS — I10 Essential (primary) hypertension: Secondary | ICD-10-CM | POA: Diagnosis not present

## 2017-10-16 DIAGNOSIS — J45909 Unspecified asthma, uncomplicated: Secondary | ICD-10-CM | POA: Diagnosis not present

## 2017-10-16 DIAGNOSIS — E1140 Type 2 diabetes mellitus with diabetic neuropathy, unspecified: Secondary | ICD-10-CM

## 2017-10-16 DIAGNOSIS — K219 Gastro-esophageal reflux disease without esophagitis: Secondary | ICD-10-CM | POA: Diagnosis not present

## 2017-10-16 DIAGNOSIS — E118 Type 2 diabetes mellitus with unspecified complications: Secondary | ICD-10-CM | POA: Diagnosis not present

## 2017-10-16 DIAGNOSIS — M79605 Pain in left leg: Secondary | ICD-10-CM

## 2017-10-16 DIAGNOSIS — Z9119 Patient's noncompliance with other medical treatment and regimen: Secondary | ICD-10-CM | POA: Diagnosis not present

## 2017-10-16 DIAGNOSIS — M545 Low back pain: Secondary | ICD-10-CM

## 2017-10-16 DIAGNOSIS — Z79899 Other long term (current) drug therapy: Secondary | ICD-10-CM | POA: Diagnosis not present

## 2017-10-16 DIAGNOSIS — Z885 Allergy status to narcotic agent status: Secondary | ICD-10-CM | POA: Insufficient documentation

## 2017-10-16 DIAGNOSIS — Z888 Allergy status to other drugs, medicaments and biological substances status: Secondary | ICD-10-CM | POA: Diagnosis not present

## 2017-10-16 DIAGNOSIS — Z91041 Radiographic dye allergy status: Secondary | ICD-10-CM | POA: Insufficient documentation

## 2017-10-16 DIAGNOSIS — R109 Unspecified abdominal pain: Secondary | ICD-10-CM | POA: Diagnosis present

## 2017-10-16 DIAGNOSIS — K429 Umbilical hernia without obstruction or gangrene: Secondary | ICD-10-CM | POA: Diagnosis not present

## 2017-10-16 DIAGNOSIS — K529 Noninfective gastroenteritis and colitis, unspecified: Secondary | ICD-10-CM | POA: Diagnosis not present

## 2017-10-16 DIAGNOSIS — R1033 Periumbilical pain: Secondary | ICD-10-CM | POA: Diagnosis not present

## 2017-10-16 DIAGNOSIS — Z794 Long term (current) use of insulin: Secondary | ICD-10-CM

## 2017-10-16 DIAGNOSIS — IMO0002 Reserved for concepts with insufficient information to code with codable children: Secondary | ICD-10-CM

## 2017-10-16 DIAGNOSIS — G8929 Other chronic pain: Secondary | ICD-10-CM

## 2017-10-16 DIAGNOSIS — M79604 Pain in right leg: Secondary | ICD-10-CM

## 2017-10-16 LAB — GLUCOSE, POCT (MANUAL RESULT ENTRY): POC GLUCOSE: 286 mg/dL — AB (ref 70–99)

## 2017-10-16 MED ORDER — LISINOPRIL 10 MG PO TABS: 10.0000 mg | ORAL_TABLET | Freq: Every day | ORAL | 3 refills | Status: DC

## 2017-10-16 MED ORDER — METOCLOPRAMIDE HCL 10 MG PO TABS
10.0000 mg | ORAL_TABLET | Freq: Three times a day (TID) | ORAL | 0 refills | Status: DC | PRN
Start: 2017-10-16 — End: 2018-03-04

## 2017-10-16 MED ORDER — IBUPROFEN 800 MG PO TABS: 800.0000 mg | ORAL_TABLET | Freq: Three times a day (TID) | ORAL | 0 refills | Status: DC | PRN

## 2017-10-16 MED ORDER — NAPROXEN 500 MG PO TABS: 500.0000 mg | ORAL_TABLET | Freq: Two times a day (BID) | ORAL | 1 refills | Status: DC

## 2017-10-16 MED ORDER — GABAPENTIN 300 MG PO CAPS: 600.0000 mg | ORAL_CAPSULE | Freq: Three times a day (TID) | ORAL | 3 refills | Status: DC

## 2017-10-16 MED ORDER — METFORMIN HCL ER 500 MG PO TB24: 500.0000 mg | ORAL_TABLET | Freq: Two times a day (BID) | ORAL | 3 refills | Status: DC

## 2017-10-16 MED ORDER — METHOCARBAMOL 500 MG PO TABS: 1000.0000 mg | ORAL_TABLET | Freq: Four times a day (QID) | ORAL | 1 refills | Status: DC | PRN

## 2017-10-16 MED ORDER — LANTUS SOLOSTAR 100 UNIT/ML ~~LOC~~ SOPN
PEN_INJECTOR | SUBCUTANEOUS | 2 refills | Status: DC
Start: 2017-10-16 — End: 2017-11-20

## 2017-10-16 MED FILL — ACCU-CHEK SOFTCLIX LANCETS: 30 days supply | Qty: 100 | Fill #0

## 2017-10-16 MED FILL — TRUEPLUS PEN NDL 32GX5/32": 32G X 4 MM | 30 days supply | Qty: 100 | Fill #1

## 2017-10-16 MED FILL — METHOCARBAMOL 500 MG TABLET: 500 | 11 days supply | Qty: 90 | Fill #0

## 2017-10-16 MED FILL — IBUPROFEN 800 MG TABLET: 800 | 20 days supply | Qty: 60 | Fill #0

## 2017-10-16 MED FILL — TRUEPLUS PEN NDL 32GX5/32: 32G X 4 MM | 30 days supply | Qty: 100 | Fill #1

## 2017-10-16 MED FILL — LISINOPRIL 10 MG TABS: 10 | 30 days supply | Qty: 30 | Fill #0

## 2017-10-16 MED FILL — METOCLOPRAMIDE 10 MG TABLET: 10 | 10 days supply | Qty: 30 | Fill #0

## 2017-10-16 MED FILL — LANTUS SOLOSTAR 100 UNITS/M: 100 | 30 days supply | Qty: 30 | Fill #0

## 2017-10-16 MED FILL — GABAPENTIN 300 MG CAPSULE: 300 | 30 days supply | Qty: 180 | Fill #0

## 2017-10-16 MED FILL — ACCU-CHEK AVIVA PLUS TEST S: 30 days supply | Qty: 100 | Fill #0

## 2017-10-16 NOTE — Progress Notes (Signed)
cPt. Is here for a stomach pain and feet pain. Pt. Think her stomach pain is related to her hernia and giving her back pain.  Pt. Need medication refill.

## 2017-10-16 NOTE — Progress Notes (Signed)
Patient ID: Sarah Garrison, female   DOB: [birth year], 37 y.o.   MRN: [id number]   Sarah Garrison, is a 37 y.o. female  [id number]  [id number]  DOB - September 06, 1980  Subjective:  Chief Complaint and HPI: Sarah Garrison is a 37 y.o. female here today with multiple issues.  Needs RF.  Having abdominal pain around area of umbilicus with known hernia.  Some nausea and vomiting.  Pain has increased over the last day.  Not taking reglan.  Also c/o ongoing foot and leg pain.  Gabapentin not helping much but blood sugars are very poorly controlled.  She says she is no longer going to take trulicity due to it causing her to vomit.  Also having LBP which is not new.  No radicular s/sx.  No f/c.  Not taking anything other than tyelnol and methocarbamol 500 bid.      ROS:   Constitutional:  No f/c, No night sweats, No unexplained weight loss. EENT:  No vision changes, No blurry vision, No hearing changes. No mouth, throat, or ear problems.  Respiratory: No cough, No SOB Cardiac: No CP, no palpitations GI:  + abd pain, +some N/V. No D/C. GU: No Urinary s/sx Musculoskeletal: back and leg pain Neuro: No headache, no dizziness, no motor weakness.  Skin: No rash Endocrine:  No polydipsia. No polyuria.  Psych: Denies SI/HI  No problems updated.  ALLERGIES: Allergies  Allergen Reactions  . Morphine And Related Shortness Of Breath  . Glyburide Diarrhea and Other (See Comments)    Reaction:  Nose bleeds   . Ivp Dye [Iodinated Diagnostic Agents]     Shortness of breath.    . Oxycodone Other (See Comments)    Pt states that this medication makes her "dream about rabbits chasing" her.    . Vicodin [Hydrocodone-Acetaminophen] Other (See Comments)    Pt states that this medication makes her "dream about rabbits chasing" her.      PAST MEDICAL HISTORY: Past Medical History:  Diagnosis Date  . Asthma   . Diabetes mellitus without complication ([place])   . GERD (gastroesophageal reflux disease)    . Hypertension     MEDICATIONS AT HOME: Prior to Admission medications   Medication Sig Start Date End Date Taking? Authorizing Provider  acetaminophen (TYLENOL) 500 MG tablet Take 1,000 mg by mouth daily as needed.   Yes [provider]  Blood Glucose Monitoring Suppl (TRUE METRIX METER) w/Device KIT Use as directed 06/25/17  Yes Jegede, Olugbemiga E, MD  gabapentin (NEURONTIN) 300 MG capsule Take 2 capsules (600 mg total) by mouth 3 (three) times daily. 10/16/17  Yes Freeman Caldron M, PA-C  glucose blood (TRUE METRIX BLOOD GLUCOSE TEST) test strip Use as instructed 03/04/17  Yes Jegede, Olugbemiga E, MD  insulin aspart (NOVOLOG) 100 UNIT/ML FlexPen Inject 10 Units into the skin 3 (three) times daily with meals. Take with largest meal. 07/24/17  Yes Tresa Garter, MD  LANTUS SOLOSTAR 100 UNIT/ML Solostar Pen 45 units twice daily 10/16/17  Yes McClung, Angela M, PA-C  lisinopril (PRINIVIL,ZESTRIL) 10 MG tablet Take 1 tablet (10 mg total) by mouth daily. 10/16/17  Yes Argentina Donovan, PA-C  loperamide (IMODIUM A-D) 2 MG tablet Take 1 tablet (2 mg total) by mouth 4 (four) times daily as needed for diarrhea or loose stools. 05/29/17  Yes Hairston, Maylon Peppers, FNP  metFORMIN (GLUCOPHAGE-XR) 500 MG 24 hr tablet Take 1 tablet (500 mg total) by mouth 2 (two) times daily. 10/16/17  Yes McClung,  Dionne Bucy, PA-C  methocarbamol (ROBAXIN) 500 MG tablet Take 2 tablets (1,000 mg total) by mouth every 6 (six) hours as needed for muscle spasms. 10/16/17  Yes Freeman Caldron M, PA-C  nystatin (NYSTATIN) powder Apply topically 3 (three) times daily. 09/23/17  Yes Charlott Rakes, MD  TRUEPLUS LANCETS 28G MISC Use as directed 03/04/17  Yes Jegede, Olugbemiga E, MD  TRUEPLUS PEN NEEDLES 32G X 4 MM MISC Test 3x per day 02/11/17  Yes Jegede, Olugbemiga E, MD  albuterol (PROVENTIL HFA;VENTOLIN HFA) 108 (90 Base) MCG/ACT inhaler Inhale 1-2 puffs into the lungs every 6 (six) hours as needed for wheezing or  shortness of breath. Patient not taking: Reported on 05/29/2017 03/30/17   Ocie Cornfield T, PA-C  ibuprofen (ADVIL,MOTRIN) 200 MG tablet Take 800 mg by mouth daily as needed.    [provider]  metoCLOPramide (REGLAN) 10 MG tablet Take 1 tablet (10 mg total) by mouth every 8 (eight) hours as needed for nausea. 10/16/17   Argentina Donovan, PA-C  naproxen (NAPROSYN) 500 MG tablet Take 1 tablet (500 mg total) by mouth 2 (two) times daily with a meal. 10/16/17   McClung, Dionne Bucy, PA-C  omeprazole (PRILOSEC) 20 MG capsule Take 1 capsule (20 mg total) by mouth daily. Patient not taking: Reported on 10/16/2017 07/24/17   Tresa Garter, MD  ondansetron (ZOFRAN) 8 MG tablet Take 1 tablet (8 mg total) by mouth every 8 (eight) hours as needed for nausea or vomiting. Patient not taking: Reported on 07/16/2017 06/27/17   Argentina Donovan, PA-C  Phenylephrine-Witch Hazel (HEMORRHOIDAL COOLING) 0.25-50 % GEL Use as directed. Patient not taking: Reported on 06/27/2017 05/29/17   Alfonse Spruce, FNP     Objective:  EXAM:   Vitals:   10/16/17 0951  BP: 138/89  Pulse: 97  Resp: 16  Temp: 98.9 F (37.2 C)  TempSrc: Oral  SpO2: 97%  Weight: 217 lb 6.4 oz (98.6 kg)  Height: 5' (1.524 m)    General appearance : A&OX3. NAD. Non-toxic-appearing HEENT: Atraumatic and Normocephalic.  PERRLA. EOM intact.  Neck: supple, no JVD. No cervical lymphadenopathy. No thyromegaly Chest/Lungs:  Breathing-non-labored, Good air entry bilaterally, breath sounds normal without rales, rhonchi, or wheezing  CVS: S1 S2 regular, no murmurs, gallops, rubs  Abdomen: obese, Bowel sounds present, Non tender and not distended with no gaurding, rigidity or rebound. 3cm umbilival hernia Extremities: Bilateral Lower Ext shows no edema, both legs are warm to touch with = pulse throughout Neurology:  CN II-XII grossly intact, Non focal.   Psych:  TP linear. J/I WNL. Normal speech. Appropriate eye contact and  affect.  Skin:  No Rash  Data Review Lab Results  Component Value Date   HGBA1C 11.0 08/14/2017   HGBA1C 11.9 05/29/2017   HGBA1C 11.5 02/11/2017     Assessment & Plan   1. Umbilical hernia without obstruction and without gangrene with abdominal pain-not acute abdomen here in office Resume reglan.  Check CMP/CBC - Ambulatory referral to General Surgery  2. Type 2 diabetes mellitus with diabetic neuropathy, unspecified whether long term insulin use (HCC) Uncontrolled.  Stopped and refuses to take trulicity.  Non-compliance in general.  See #3 for new lantus dosing.   - Glucose (CBG) -counseled at length on importance of good glycemic control  3. Uncontrolled type 2 diabetes mellitus with complication, with long-term current use of insulin ([place]) resume lantus and increase dose.  She has stopped and is refusing trulicity - gabapentin (NEURONTIN) 300 MG  capsule; Take 2 capsules (600 mg total) by mouth 3 (three) times daily.  Dispense: 180 capsule; Refill: 3 - LANTUS SOLOSTAR 100 UNIT/ML Solostar Pen; 45 units twice daily  Dispense: 15 mL; Refill: 2 - metFORMIN (GLUCOPHAGE-XR) 500 MG 24 hr tablet; Take 1 tablet (500 mg total) by mouth 2 (two) times daily.  Dispense: 180 tablet; Refill: 3  4. Pain in both lower extremities - gabapentin (NEURONTIN) 300 MG capsule; Take 2 capsules (600 mg total) by mouth 3 (three) times daily.  Dispense: 180 capsule; Refill: 3 - methocarbamol (ROBAXIN) 500 MG tablet; Take 2 tablets (1,000 mg total) by mouth every 6 (six) hours as needed for muscle spasms.  Dispense: 90 tablet; Refill: 1  5. Essential hypertension Adequate Control.  continue - lisinopril (PRINIVIL,ZESTRIL) 10 MG tablet; Take 1 tablet (10 mg total) by mouth daily.  Dispense: 90 tablet; Refill: 3  6. Gastroenteritis, acute - metoCLOPramide (REGLAN) 10 MG tablet; Take 1 tablet (10 mg total) by mouth every 8 (eight) hours as needed for nausea.  Dispense: 30 tablet; Refill: 0  7. Chronic  midline low back pain without sciatica  - naproxen (NAPROSYN) 500 MG tablet; Take 1 tablet (500 mg total) by mouth 2 (two) times daily with a meal.  Dispense: 60 tablet; Refill: 1 Also increased dosing on methocarbamol  OOW til Sunday per patient request.    Patient have been counseled extensively about nutrition and exercise  Return in about 1 month (around 11/16/2017) for assign new PCP; f/up DM and htn.  The patient was given clear instructions to go to ER or return to medical center if symptoms don't improve, worsen or new problems develop. The patient verbalized understanding. The patient was told to call to get lab results if they haven't heard anything in the next week.     Freeman Caldron, PA-C [facility], [place]   10/16/2017, 10:13 AM

## 2017-10-17 ENCOUNTER — Other Ambulatory Visit: Payer: Self-pay | Admitting: Family Medicine

## 2017-10-17 LAB — COMPREHENSIVE METABOLIC PANEL
ALBUMIN: 4.4 g/dL (ref 3.5–5.5)
ALK PHOS: 126 IU/L — AB (ref 39–117)
ALT: 18 IU/L (ref 0–32)
AST: 15 IU/L (ref 0–40)
Albumin/Globulin Ratio: 2 (ref 1.2–2.2)
BUN/Creatinine Ratio: 17 (ref 9–23)
BUN: 11 mg/dL (ref 6–20)
Bilirubin Total: 0.3 mg/dL (ref 0.0–1.2)
CO2: 19 mmol/L — AB (ref 20–29)
CREATININE: 0.63 mg/dL (ref 0.57–1.00)
Calcium: 9.6 mg/dL (ref 8.7–10.2)
Chloride: 105 mmol/L (ref 96–106)
GFR calc Af Amer: 134 mL/min/{1.73_m2} (ref >59)
GFR calc non Af Amer: 117 mL/min/{1.73_m2} (ref >59)
GLUCOSE: 263 mg/dL — AB (ref 65–99)
Globulin, Total: 2.2 g/dL (ref 1.5–4.5)
Potassium: 4.2 mmol/L (ref 3.5–5.2)
SODIUM: 138 mmol/L (ref 134–144)
Total Protein: 6.6 g/dL (ref 6.0–8.5)

## 2017-10-17 LAB — CBC WITH DIFFERENTIAL/PLATELET
Basophils Absolute: 0 10*3/uL (ref 0.0–0.2)
Basos: 0 %
EOS (ABSOLUTE): 0.1 10*3/uL (ref 0.0–0.4)
Eos: 1 %
Hematocrit: 43.2 % (ref 34.0–46.6)
Hemoglobin: 14.6 g/dL (ref 11.1–15.9)
IMMATURE GRANS (ABS): 0 10*3/uL (ref 0.0–0.1)
IMMATURE GRANULOCYTES: 0 %
LYMPHS: 43 %
Lymphocytes Absolute: 3.4 10*3/uL — ABNORMAL HIGH (ref 0.7–3.1)
MCH: 30.2 pg (ref 26.6–33.0)
MCHC: 33.8 g/dL (ref 31.5–35.7)
MCV: 89 fL (ref 79–97)
Monocytes Absolute: 0.5 10*3/uL (ref 0.1–0.9)
Monocytes: 6 %
NEUTROS PCT: 50 %
Neutrophils Absolute: 3.9 10*3/uL (ref 1.4–7.0)
PLATELETS: 302 10*3/uL (ref 150–379)
RBC: 4.84 x10E6/uL (ref 3.77–5.28)
RDW: 13.2 % (ref 12.3–15.4)
WBC: 7.8 10*3/uL (ref 3.4–10.8)

## 2017-10-17 MED ORDER — ACCU-CHEK AVIVA DEVI: 0 refills | Status: DC

## 2017-10-18 ENCOUNTER — Telehealth: Payer: Self-pay

## 2017-10-18 MED FILL — ACCU-CHEK AVIVA PLUS METER: W/DEVICE | 1 days supply | Qty: 1 | Fill #0

## 2017-10-18 NOTE — Telephone Encounter (Signed)
-----   Message from Anders Simmonds, [place] sent at 10/17/2017 10:40 AM EDT ----- Labs show high blood sugar as we knew but no reason for abdominal pain.  Go to the emergency dept if your pain worsens or you develop fever with it. I sent a refill of reglan to the pharmacy yesterday which may help.  Improved blood sugar and better diet is also important as we discussed.  Thanks, Georgian Co

## 2017-10-18 NOTE — Telephone Encounter (Signed)
CMA called patient to inform on lab results and Rx refill.  Patient understood.

## 2017-10-26 ENCOUNTER — Encounter (HOSPITAL_COMMUNITY): Payer: Self-pay | Admitting: Emergency Medicine

## 2017-10-26 ENCOUNTER — Other Ambulatory Visit: Payer: Self-pay

## 2017-10-26 ENCOUNTER — Emergency Department (HOSPITAL_COMMUNITY)
Admission: EM | Admit: 2017-10-26 | Discharge: 2017-10-26 | Disposition: A | Discharge status: Home / Self Care | Payer: Medicaid Other | Attending: Emergency Medicine | Admitting: Emergency Medicine

## 2017-10-26 ENCOUNTER — Encounter: Payer: Self-pay | Admitting: Internal Medicine

## 2017-10-26 DIAGNOSIS — L02411 Cutaneous abscess of right axilla: Secondary | ICD-10-CM | POA: Diagnosis not present

## 2017-10-26 DIAGNOSIS — Z5321 Procedure and treatment not carried out due to patient leaving prior to being seen by health care provider: Secondary | ICD-10-CM | POA: Diagnosis not present

## 2017-10-26 LAB — CBC WITH DIFFERENTIAL/PLATELET
Abs Immature Granulocytes: 0 10*3/uL (ref 0.0–0.1)
BASOS ABS: 0.1 10*3/uL (ref 0.0–0.1)
BASOS PCT: 1 %
EOS PCT: 1 %
Eosinophils Absolute: 0.1 10*3/uL (ref 0.0–0.7)
HCT: 43.3 % (ref 36.0–46.0)
Hemoglobin: 14.7 g/dL (ref 12.0–15.0)
Immature Granulocytes: 0 %
Lymphocytes Relative: 44 %
Lymphs Abs: 4 10*3/uL (ref 0.7–4.0)
MCH: 29.7 pg (ref 26.0–34.0)
MCHC: 33.9 g/dL (ref 30.0–36.0)
MCV: 87.5 fL (ref 78.0–100.0)
Monocytes Absolute: 0.5 10*3/uL (ref 0.1–1.0)
Monocytes Relative: 6 %
Neutro Abs: 4.4 10*3/uL (ref 1.7–7.7)
Neutrophils Relative %: 48 %
PLATELETS: 298 10*3/uL (ref 150–400)
RBC: 4.95 MIL/uL (ref 3.87–5.11)
RDW: 11.9 % (ref 11.5–15.5)
WBC: 9.1 10*3/uL (ref 4.0–10.5)

## 2017-10-26 LAB — COMPREHENSIVE METABOLIC PANEL
ALK PHOS: 150 U/L — AB (ref 38–126)
ALT: 13 U/L — AB (ref 14–54)
ANION GAP: 11 (ref 5–15)
AST: 13 U/L — ABNORMAL LOW (ref 15–41)
Albumin: 3.8 g/dL (ref 3.5–5.0)
BUN: 10 mg/dL (ref 6–20)
CO2: 22 mmol/L (ref 22–32)
CREATININE: 0.71 mg/dL (ref 0.44–1.00)
Calcium: 9.6 mg/dL (ref 8.9–10.3)
Chloride: 102 mmol/L (ref 101–111)
GFR calc non Af Amer: >60 mL/min (ref >60)
Glucose, Bld: 397 mg/dL — ABNORMAL HIGH (ref 65–99)
Potassium: 3.6 mmol/L (ref 3.5–5.1)
Sodium: 135 mmol/L (ref 135–145)
TOTAL PROTEIN: 6.8 g/dL (ref 6.5–8.1)
Total Bilirubin: 0.6 mg/dL (ref 0.3–1.2)

## 2017-10-26 LAB — I-STAT BETA HCG BLOOD, ED (MC, WL, AP ONLY)

## 2017-10-26 LAB — I-STAT CG4 LACTIC ACID, ED: Lactic Acid, Venous: 1.69 mmol/L (ref 0.5–1.9)

## 2017-10-26 NOTE — ED Notes (Signed)
Pt called to recheck vitals in adult and peds waiting rooms 3x each. Pt not responding and presumed back in Peds.

## 2017-10-26 NOTE — ED Triage Notes (Signed)
Pt presents with abscess to right axilla. Afebrile.

## 2017-10-26 NOTE — ED Notes (Signed)
Hx diabetes, pt reports blood sugars in the 400s.

## 2017-10-27 ENCOUNTER — Ambulatory Visit (HOSPITAL_COMMUNITY)
Admission: EM | Admit: 2017-10-27 | Discharge: 2017-10-27 | Disposition: A | Discharge status: Home / Self Care | Payer: Medicaid Other | Attending: Family Medicine | Admitting: Family Medicine

## 2017-10-27 ENCOUNTER — Encounter (HOSPITAL_COMMUNITY): Payer: Self-pay | Admitting: *Deleted

## 2017-10-27 ENCOUNTER — Other Ambulatory Visit: Payer: Self-pay

## 2017-10-27 DIAGNOSIS — L03111 Cellulitis of right axilla: Secondary | ICD-10-CM

## 2017-10-27 DIAGNOSIS — B370 Candidal stomatitis: Secondary | ICD-10-CM | POA: Diagnosis not present

## 2017-10-27 MED ORDER — FLUCONAZOLE 150 MG PO TABS: ORAL_TABLET | ORAL | 5 refills | Status: DC

## 2017-10-27 MED ORDER — CHLORHEXIDINE GLUCONATE 0.12 % MT SOLN: 15.0000 mL | Freq: Two times a day (BID) | OROMUCOSAL | 0 refills | Status: DC

## 2017-10-27 MED ORDER — DOXYCYCLINE HYCLATE 100 MG PO TABS: 100.0000 mg | ORAL_TABLET | Freq: Two times a day (BID) | ORAL | 0 refills | Status: DC

## 2017-10-27 NOTE — ED Triage Notes (Addendum)
Boil under right arm for about 3 days, was on medication for about 1.5 wks ago and it went away and now it's back, per pt her tongue is hurting

## 2017-10-27 NOTE — ED Provider Notes (Signed)
[facility]   [id number] 10/27/17 Arrival Time: 1203   SUBJECTIVE:  Sarah Garrison is a 37 y.o. female who presents to the urgent care with complaint of Boil under right arm for about 3 days, was on medication for about 1.5 wks ago and it went away and now it's back, per pt her tongue is hurting  Patient is a diabetic with persistently high blood sugars.  Past Medical History:  Diagnosis Date  . Asthma   . Diabetes mellitus without complication (HCC)   . GERD (gastroesophageal reflux disease)   . Hypertension    Family History  Problem Relation Age of Onset  . Asthma Mother   . Asthma Father    Social History   Socioeconomic History  . Marital status: Single    Spouse name: Not on file  . Number of children: Not on file  . Years of education: Not on file  . Highest education level: Not on file  Occupational History  . Not on file  Social Needs  . Financial resource strain: Not on file  . Food insecurity:    Worry: Not on file    Inability: Not on file  . Transportation needs:    Medical: Not on file    Non-medical: Not on file  Tobacco Use  . Smoking status: Current Every Day Smoker    Packs/day: 0.10    Types: Cigarettes    Last attempt to quit: 11/02/2013    Years since quitting: 3.9  . Smokeless tobacco: Never Used  Substance and Sexual Activity  . Alcohol use: No  . Drug use: No  . Sexual activity: Yes    Birth control/protection: Surgical  Lifestyle  . Physical activity:    Days per week: Not on file    Minutes per session: Not on file  . Stress: Not on file  Relationships  . Social connections:    Talks on phone: Not on file    Gets together: Not on file    Attends religious service: Not on file    Active member of club or organization: Not on file    Attends meetings of clubs or organizations: Not on file    Relationship status: Not on file  . Intimate partner violence:    Fear of current or ex partner: Not on file    Emotionally  abused: Not on file    Physically abused: Not on file    Forced sexual activity: Not on file  Other Topics Concern  . Not on file  Social History Narrative  . Not on file   Current Meds  Medication Sig  . LANTUS SOLOSTAR 100 UNIT/ML Solostar Pen 45 units twice daily  . TRUEPLUS LANCETS 28G MISC Use as directed  . TRUEPLUS PEN NEEDLES 32G X 4 MM MISC Test 3x per day   Allergies  Allergen Reactions  . Morphine And Related Shortness Of Breath  . Glyburide Diarrhea and Other (See Comments)    Reaction:  Nose bleeds   . Ivp Dye [Iodinated Diagnostic Agents]     Shortness of breath.    . Oxycodone Other (See Comments)    Pt states that this medication makes her "dream about rabbits chasing" her.    . Vicodin [Hydrocodone-Acetaminophen] Other (See Comments)    Pt states that this medication makes her "dream about rabbits chasing" her.        ROS: As per HPI, remainder of ROS negative.   OBJECTIVE:   Vitals:  10/27/17 1218  BP: 132/90  Pulse: (!) 101  Temp: 97.7 F (36.5 C)  TempSrc: Oral  SpO2: 100%     General appearance: alert; no distress Eyes: PERRL; EOMI; conjunctiva normal HENT: normocephalic; atraumatic;oral mucosa coated Neck: supple Lungs: clear to auscultation bilaterally Heart: regular rate and rhythm Abdomen: soft, non-tender; bowel sounds normal; no masses or organomegaly; no guarding or rebound tenderness Back: no CVA tenderness Extremities: no cyanosis or edema; symmetrical with no gross deformities Skin: warm and dry; tender 1 cm superficial cellulitis right axilla Neurologic: normal gait; grossly normal Psychological: alert and cooperative; normal mood and affect      Labs:  Results for orders placed or performed during the hospital encounter of 10/26/17  Comprehensive metabolic panel  Result Value Ref Range   Sodium 135 135 - 145 mmol/L   Potassium 3.6 3.5 - 5.1 mmol/L   Chloride 102 101 - 111 mmol/L   CO2 22 22 - 32 mmol/L   Glucose,  Bld 397 (H) 65 - 99 mg/dL   BUN 10 6 - 20 mg/dL   Creatinine, Ser 1.47 0.44 - 1.00 mg/dL   Calcium 9.6 8.9 - 82.9 mg/dL   Total Protein 6.8 6.5 - 8.1 g/dL   Albumin 3.8 3.5 - 5.0 g/dL   AST 13 (L) 15 - 41 U/L   ALT 13 (L) 14 - 54 U/L   Alkaline Phosphatase 150 (H) 38 - 126 U/L   Total Bilirubin 0.6 0.3 - 1.2 mg/dL   GFR calc non Af Amer >60 >60 mL/min   GFR calc Af Amer >60 >60 mL/min   Anion gap 11 5 - 15  CBC with Differential  Result Value Ref Range   WBC 9.1 4.0 - 10.5 K/uL   RBC 4.95 3.87 - 5.11 MIL/uL   Hemoglobin 14.7 12.0 - 15.0 g/dL   HCT 56.2 13.0 - 86.5 %   MCV 87.5 78.0 - 100.0 fL   MCH 29.7 26.0 - 34.0 pg   MCHC 33.9 30.0 - 36.0 g/dL   RDW 78.4 69.6 - 29.5 %   Platelets 298 150 - 400 K/uL   Neutrophils Relative % 48 %   Neutro Abs 4.4 1.7 - 7.7 K/uL   Lymphocytes Relative 44 %   Lymphs Abs 4.0 0.7 - 4.0 K/uL   Monocytes Relative 6 %   Monocytes Absolute 0.5 0.1 - 1.0 K/uL   Eosinophils Relative 1 %   Eosinophils Absolute 0.1 0.0 - 0.7 K/uL   Basophils Relative 1 %   Basophils Absolute 0.1 0.0 - 0.1 K/uL   Immature Granulocytes 0 %   Abs Immature Granulocytes 0.0 0.0 - 0.1 K/uL  I-Stat CG4 Lactic Acid, ED  Result Value Ref Range   Lactic Acid, Venous 1.69 0.5 - 1.9 mmol/L  I-Stat beta hCG blood, ED  Result Value Ref Range   I-stat hCG, quantitative <5.0 <5 mIU/mL   Comment 3            Labs Reviewed - No data to display  No results found.     ASSESSMENT & PLAN:  1. Cellulitis of axilla, right   2. Oral thrush     Meds ordered this encounter  Medications  . chlorhexidine (PERIDEX) 0.12 % solution    Sig: Use as directed 15 mLs in the mouth or throat 2 (two) times daily.    Dispense:  120 mL    Refill:  0  . fluconazole (DIFLUCAN) 150 MG tablet    Sig: Take one  tablet daily for 5 days    Dispense:  5 tablet    Refill:  5  . doxycycline (VIBRA-TABS) 100 MG tablet    Sig: Take 1 tablet (100 mg total) by mouth 2 (two) times daily.     Dispense:  20 tablet    Refill:  0    Reviewed expectations re: course of current medical issues. Questions answered. Outlined signs and symptoms indicating need for more acute intervention. Patient verbalized understanding. After Visit Summary given.    Procedures:      Elvina Sidle, MD 10/27/17 1237

## 2017-10-27 NOTE — Discharge Instructions (Addendum)
Continue your antibiotics as ordered until they are complete.

## 2017-11-05 ENCOUNTER — Ambulatory Visit: Payer: Self-pay | Admitting: Surgery

## 2017-11-05 NOTE — H&P (Signed)
Sarah Garrison Documented: 11/05/2017 9:53 AM Location: [facility] Patient #: [id number] DOB: Jun 08, 1980 Single / Language: Sarah Garrison / Race: Black or African American Female  History of Present Illness Sarah Garrison A. Tavio Biegel MD; 11/05/2017 10:31 AM) Patient words: Patient sent at the request of Sarah Garrison BA for bulge of umbilicus 1 year. The area is painful especially when she coughs lifts or its pushed on. Locations just above the umbilicus. It's about the size of a small walnut. Popson pops out. The pain is moderate in intensity made worse with activity made better with rest. He does remain soft. It is much smaller when she is lying on her back. CT scan was done which shows a small ventral hernia approximately 2-3 cm above the umbilicus. Pain is worsening over time and is more constatnt               Mid abdominal pain. Lump around umbilicus, possible hernia versus abscess.  EXAM: CT ABDOMEN AND PELVIS WITHOUT CONTRAST  TECHNIQUE: Multidetector CT imaging of the abdomen and pelvis was performed following the standard protocol without IV contrast.  COMPARISON: None.  FINDINGS: Lower chest: Dependent atelectasis in the left greater than right lower lobe. No pleural fluid.  Hepatobiliary: No focal liver abnormality is seen. No gallstones, gallbladder wall thickening, or biliary dilatation.  Pancreas: No ductal dilatation or inflammation.  Spleen: Normal in size without focal abnormality.  Adrenals/Urinary Tract: Normal adrenal glands. No hydronephrosis or perinephric edema. Ureters are decompressed. No urolithiasis. Urinary bladder is physiologically distended, no bladder wall thickening.  Stomach/Bowel: Stomach is within normal limits. Appendix appears normal. No evidence of bowel wall thickening, distention, or inflammatory changes.  Vascular/Lymphatic: Abdominal aorta is normal in caliber. No abdominopelvic adenopathy.  Reproductive:  Uterus and bilateral adnexa are unremarkable.  Other: Tiny fat containing supraumbilical hernia with narrow 5 mm neck. No inflammation of the herniated fat. Separate tiny fat containing umbilical hernia with minimal soft tissue thickening/ stranding inferiorly. No abscess. No free air, free fluid, or intra-abdominal fluid collection.  Musculoskeletal: There are no acute or suspicious osseous abnormalities.  IMPRESSION: 1. Tiny fat containing umbilical hernia with minimal soft tissue thickening/stranding inferiorly. No fluid collection or abscess. There is a separate tiny fat containing supraumbilical ventral abdominal wall hernia without inflammation. 2. No additional acute abnormality in the abdomen or pelvis.   Electronically Signed By: Jeb Levering M.D. On: 12/11/2016 19:29.  The patient is a 37 year old female.   Past Surgical History Sarah Garrison; 11/05/2017 9:53 AM) Cesarean Section - 1  Diagnostic Studies History Sarah Garrison; 11/05/2017 9:53 AM) Colonoscopy never Mammogram never Pap Smear 1-5 years ago  Allergies Sarah Garrison; 11/05/2017 9:54 AM) Codeine/Codeine Derivatives  Medication History Sarah Garrison; 11/05/2017 9:55 AM) Accu-Chek Aviva Plus (w/Device Kit,) Active. Doxycycline Hyclate (100MG Tablet, Oral) Active. Fluconazole (150MG Tablet, Oral) Active. Gabapentin (300MG Capsule, Oral) Active. Lantus SoloStar (100UNIT/ML Soln Pen-inj, Subcutaneous) Active. Lisinopril (10MG Tablet, Oral) Active. MetFORMIN HCl ER (500MG Tablet ER 24HR, Oral) Active. Methocarbamol (500MG Tablet, Oral) Active. Metoclopramide HCl (10MG Tablet, Oral) Active. Sulfamethoxazole-Trimethoprim (800-160MG Tablet, Oral) Active. TRUEplus Lancets 28G Active. Ibuprofen (800MG Tablet, Oral) Active. Accu-Chek Aviva Plus (In Vitro) Active.  Social History Sarah Garrison; 11/05/2017 9:53 AM) Alcohol use Occasional alcohol use. No caffeine use No drug  use Tobacco use Current every day smoker.  Family History Sarah Garrison; 11/05/2017 9:53 AM) Breast Cancer Family Members In [place] Father. Diabetes Mellitus Brother, Family Members In [place]. Hypertension Brother, Family Members In [place].  Pregnancy / Birth History Sarah Garrison; 11/05/2017 9:53 AM) Age at menarche 25 years. Gravida 4 Length (months) of breastfeeding 7-12 Maternal age 25-20 Para 4 Regular periods  Other Problems Sarah Garrison; 11/05/2017 9:53 AM) Asthma Diabetes Mellitus Gastroesophageal Reflux Disease High blood pressure Inguinal Hernia Umbilical Hernia Repair Ventral Hernia Repair     Review of Systems Sarah Garrison; 11/05/2017 9:53 AM) General Not Present- Appetite Loss, Chills, Fatigue, Fever, Night Sweats, Weight Gain and Weight Loss. HEENT Not Present- Earache, Hearing Loss, Hoarseness, Nose Bleed, Oral Ulcers, Ringing in the Ears, Seasonal Allergies, Sinus Pain, Sore Throat, Visual Disturbances, Wears glasses/contact lenses and Yellow Eyes. Respiratory Not Present- Bloody sputum, Chronic Cough, Difficulty Breathing, Snoring and Wheezing. Breast Not Present- Breast Mass, Breast Pain, Nipple Discharge and Skin Changes. Cardiovascular Not Present- Chest Pain, Difficulty Breathing Lying Down, Leg Cramps, Palpitations, Rapid Heart Rate, Shortness of Breath and Swelling of Extremities. Gastrointestinal Present- Abdominal Pain. Not Present- Bloating, Bloody Stool, Change in Bowel Habits, Chronic diarrhea, Constipation, Difficulty Swallowing, Excessive gas, Gets full quickly at meals, Hemorrhoids, Indigestion, Nausea, Rectal Pain and Vomiting. Female Genitourinary Present- Frequency. Not Present- Nocturia, Painful Urination, Pelvic Pain and Urgency. Neurological Present- Tingling. Not Present- Decreased Memory, Fainting, Headaches, Numbness, Seizures, Tremor, Trouble walking and Weakness. Psychiatric Not Present- Anxiety,  Bipolar, Change in Sleep Pattern, Depression, Fearful and Frequent crying. Endocrine Present- New Diabetes. Not Present- Cold Intolerance, Excessive Hunger, Hair Changes, Heat Intolerance and Hot flashes. Hematology Not Present- Blood Thinners, Easy Bruising, Excessive bleeding, Gland problems, HIV and Persistent Infections.  Vitals Sarah Garrison; 11/05/2017 9:56 AM) 11/05/2017 9:55 AM Weight: 213.13 lb Height: 64in Body Surface Area: 2.01 m Body Mass Index: 36.58 kg/m  Temp.: 98.4F(Oral)  Pulse: 98 (Regular)  BP: 142/92 (Sitting, Left Arm, Standard)      Physical Exam (Mazzie Brodrick A. Henna Derderian MD; 11/05/2017 10:31 AM)  General Mental Status-Alert. General Appearance-Consistent with stated age. Hydration-Well hydrated. Voice-Normal.  Head and Neck Head-normocephalic, atraumatic with no lesions or palpable masses. Trachea-midline. Thyroid Gland Characteristics - normal size and consistency.  Eye Eyeball - Bilateral-Extraocular movements intact. Sclera/Conjunctiva - Bilateral-No scleral icterus.  Chest and Lung Exam Chest and lung exam reveals -quiet, even and easy respiratory effort with no use of accessory muscles and on auscultation, normal breath sounds, no adventitious sounds and normal vocal resonance. Inspection Chest Wall - Normal. Back - normal.  Cardiovascular Cardiovascular examination reveals -normal heart sounds, regular rate and rhythm with no murmurs and normal pedal pulses bilaterally.  Abdomen Note: 1 cm ventral hernia 3 cm above umbilicus reducible  Neurologic Neurologic evaluation reveals -alert and oriented x 3 with no impairment of recent or remote memory. Mental Status-Normal.  Musculoskeletal Normal Exam - Left-Upper Extremity Strength Normal and Lower Extremity Strength Normal. Normal Exam - Right-Upper Extremity Strength Normal and Lower Extremity Strength Normal.  Lymphatic Head & Neck  General Head & Neck  Lymphatics: Bilateral - Description - Normal.    Assessment & Plan (Jusiah Aguayo A. Tamika Nou MD; 11/05/2017 10:32 AM)  VENTRAL HERNIA WITHOUT OBSTRUCTION OR GANGRENE (K43.9) Impression: Recommended open repair with mesh. Risks, benefits and other options of surgery discussed versus observation. Use of mesh discussing pros and cons of this discuss with patient. The risk of hernia repair include bleeding, infection, organ injury, bowel injury, bladder injury, nerve injury recurrent hernia, blood clots, worsening of underlying condition, chronic pain, mesh use, open surgery, death, and the need for other operattions. Pt agrees to proceed  Current Plans You are being scheduled for surgery- Our schedulers  will call you.  You should hear from our office's scheduling department within 5 working days about the location, date, and time of surgery. We try to make accommodations for patient's preferences in scheduling surgery, but sometimes the OR schedule or the surgeon's schedule prevents [place] from making those accommodations.  If you have not heard from our office [phone]) in 5 working days, call the office and ask for your surgeon's nurse.  If you have other questions about your diagnosis, plan, or surgery, call the office and ask for your surgeon's nurse.  Pt Education - Pamphlet Given - Hernia Surgery: discussed with patient and provided information. The anatomy & physiology of the abdominal wall was discussed. The pathophysiology of hernias was discussed. Natural history risks without surgery including progeressive enlargement, pain, incarceration, & strangulation was discussed. Contributors to complications such as smoking, obesity, diabetes, prior surgery, etc were discussed.  I feel the risks of no intervention will lead to serious problems that outweigh the operative risks; therefore, I recommended surgery to reduce and repair the hernia. I explained laparoscopic techniques with possible need  for an open approach. I noted the probable use of mesh to patch and/or buttress the hernia repair  Risks such as bleeding, infection, abscess, need for further treatment, heart attack, death, and other risks were discussed. I noted a good likelihood this will help address the problem. Goals of post-operative recovery were discussed as well. Possibility that this will not correct all symptoms was explained. I stressed the importance of low-impact activity, aggressive pain control, avoiding constipation, & not pushing through pain to minimize risk of post-operative chronic pain or injury. Possibility of reherniation especially with smoking, obesity, diabetes, immunosuppression, and other health conditions was discussed. We will work to minimize complications.  An educational handout further explaining the pathology & treatment options was given as well. Questions were answered. The patient expresses understanding & wishes to proceed with surgery.  Pt Education - CCS Mesh education: discussed with patient and provided information.

## 2017-11-05 NOTE — H&P (View-Only) (Signed)
[name] Documented: 11/05/2017 9:53 AM Location: [facility] Patient #: [id number] DOB: Jun 08, 1980 Single / Language: Cleophus Molt / Race: Black or African American Female  History of Present Illness Marcello Moores A. Guliana Weyandt MD; 11/05/2017 10:31 AM) Patient words: Patient sent at the request of Valinda Hoar BA for bulge of umbilicus 1 year. The area is painful especially when she coughs lifts or its pushed on. Locations just above the umbilicus. It's about the size of a small walnut. Popson pops out. The pain is moderate in intensity made worse with activity made better with rest. He does remain soft. It is much smaller when she is lying on her back. CT scan was done which shows a small ventral hernia approximately 2-3 cm above the umbilicus. Pain is worsening over time and is more constatnt               Mid abdominal pain. Lump around umbilicus, possible hernia versus abscess.  EXAM: CT ABDOMEN AND PELVIS WITHOUT CONTRAST  TECHNIQUE: Multidetector CT imaging of the abdomen and pelvis was performed following the standard protocol without IV contrast.  COMPARISON: None.  FINDINGS: Lower chest: Dependent atelectasis in the left greater than right lower lobe. No pleural fluid.  Hepatobiliary: No focal liver abnormality is seen. No gallstones, gallbladder wall thickening, or biliary dilatation.  Pancreas: No ductal dilatation or inflammation.  Spleen: Normal in size without focal abnormality.  Adrenals/Urinary Tract: Normal adrenal glands. No hydronephrosis or perinephric edema. Ureters are decompressed. No urolithiasis. Urinary bladder is physiologically distended, no bladder wall thickening.  Stomach/Bowel: Stomach is within normal limits. Appendix appears normal. No evidence of bowel wall thickening, distention, or inflammatory changes.  Vascular/Lymphatic: Abdominal aorta is normal in caliber. No abdominopelvic adenopathy.  Reproductive:  Uterus and bilateral adnexa are unremarkable.  Other: Tiny fat containing supraumbilical hernia with narrow 5 mm neck. No inflammation of the herniated fat. Separate tiny fat containing umbilical hernia with minimal soft tissue thickening/ stranding inferiorly. No abscess. No free air, free fluid, or intra-abdominal fluid collection.  Musculoskeletal: There are no acute or suspicious osseous abnormalities.  IMPRESSION: 1. Tiny fat containing umbilical hernia with minimal soft tissue thickening/stranding inferiorly. No fluid collection or abscess. There is a separate tiny fat containing supraumbilical ventral abdominal wall hernia without inflammation. 2. No additional acute abnormality in the abdomen or pelvis.   Electronically Signed By: Jeb Levering M.D. On: 12/11/2016 19:29.  The patient is a 37 year old female.   Past Surgical History Sabino Gasser; 11/05/2017 9:53 AM) Cesarean Section - 1  Diagnostic Studies History Sabino Gasser; 11/05/2017 9:53 AM) Colonoscopy never Mammogram never Pap Smear 1-5 years ago  Allergies Sabino Gasser; 11/05/2017 9:54 AM) Codeine/Codeine Derivatives  Medication History Sabino Gasser; 11/05/2017 9:55 AM) Accu-Chek Aviva Plus (w/Device Kit,) Active. Doxycycline Hyclate (100MG Tablet, Oral) Active. Fluconazole (150MG Tablet, Oral) Active. Gabapentin (300MG Capsule, Oral) Active. Lantus SoloStar (100UNIT/ML Soln Pen-inj, Subcutaneous) Active. Lisinopril (10MG Tablet, Oral) Active. MetFORMIN HCl ER (500MG Tablet ER 24HR, Oral) Active. Methocarbamol (500MG Tablet, Oral) Active. Metoclopramide HCl (10MG Tablet, Oral) Active. Sulfamethoxazole-Trimethoprim (800-160MG Tablet, Oral) Active. TRUEplus Lancets 28G Active. Ibuprofen (800MG Tablet, Oral) Active. Accu-Chek Aviva Plus (In Vitro) Active.  Social History Sabino Gasser; 11/05/2017 9:53 AM) Alcohol use Occasional alcohol use. No caffeine use No drug  use Tobacco use Current every day smoker.  Family History Sabino Gasser; 11/05/2017 9:53 AM) Breast Cancer Family Members In [place] Father. Diabetes Mellitus Brother, Family Members In General. Hypertension Brother, Family Members In [place].  Pregnancy / Birth History Sabino Gasser; 11/05/2017 9:53 AM) Age at menarche 25 years. Gravida 4 Length (months) of breastfeeding 7-12 Maternal age 25-20 Para 4 Regular periods  Other Problems Sabino Gasser; 11/05/2017 9:53 AM) Asthma Diabetes Mellitus Gastroesophageal Reflux Disease High blood pressure Inguinal Hernia Umbilical Hernia Repair Ventral Hernia Repair     Review of Systems Sabino Gasser; 11/05/2017 9:53 AM) General Not Present- Appetite Loss, Chills, Fatigue, Fever, Night Sweats, Weight Gain and Weight Loss. HEENT Not Present- Earache, Hearing Loss, Hoarseness, Nose Bleed, Oral Ulcers, Ringing in the Ears, Seasonal Allergies, Sinus Pain, Sore Throat, Visual Disturbances, Wears glasses/contact lenses and Yellow Eyes. Respiratory Not Present- Bloody sputum, Chronic Cough, Difficulty Breathing, Snoring and Wheezing. Breast Not Present- Breast Mass, Breast Pain, Nipple Discharge and Skin Changes. Cardiovascular Not Present- Chest Pain, Difficulty Breathing Lying Down, Leg Cramps, Palpitations, Rapid Heart Rate, Shortness of Breath and Swelling of Extremities. Gastrointestinal Present- Abdominal Pain. Not Present- Bloating, Bloody Stool, Change in Bowel Habits, Chronic diarrhea, Constipation, Difficulty Swallowing, Excessive gas, Gets full quickly at meals, Hemorrhoids, Indigestion, Nausea, Rectal Pain and Vomiting. Female Genitourinary Present- Frequency. Not Present- Nocturia, Painful Urination, Pelvic Pain and Urgency. Neurological Present- Tingling. Not Present- Decreased Memory, Fainting, Headaches, Numbness, Seizures, Tremor, Trouble walking and Weakness. Psychiatric Not Present- Anxiety,  Bipolar, Change in Sleep Pattern, Depression, Fearful and Frequent crying. Endocrine Present- New Diabetes. Not Present- Cold Intolerance, Excessive Hunger, Hair Changes, Heat Intolerance and Hot flashes. Hematology Not Present- Blood Thinners, Easy Bruising, Excessive bleeding, Gland problems, HIV and Persistent Infections.  Vitals Sabino Gasser; 11/05/2017 9:56 AM) 11/05/2017 9:55 AM Weight: 213.13 lb Height: 64in Body Surface Area: 2.01 m Body Mass Index: 36.58 kg/m  Temp.: 98.4F(Oral)  Pulse: 98 (Regular)  BP: 142/92 (Sitting, Left Arm, Standard)      Physical Exam (Pamla Pangle A. Johanthan Kneeland MD; 11/05/2017 10:31 AM)  General Mental Status-Alert. General Appearance-Consistent with stated age. Hydration-Well hydrated. Voice-Normal.  Head and Neck Head-normocephalic, atraumatic with no lesions or palpable masses. Trachea-midline. Thyroid Gland Characteristics - normal size and consistency.  Eye Eyeball - Bilateral-Extraocular movements intact. Sclera/Conjunctiva - Bilateral-No scleral icterus.  Chest and Lung Exam Chest and lung exam reveals -quiet, even and easy respiratory effort with no use of accessory muscles and on auscultation, normal breath sounds, no adventitious sounds and normal vocal resonance. Inspection Chest Wall - Normal. Back - normal.  Cardiovascular Cardiovascular examination reveals -normal heart sounds, regular rate and rhythm with no murmurs and normal pedal pulses bilaterally.  Abdomen Note: 1 cm ventral hernia 3 cm above umbilicus reducible  Neurologic Neurologic evaluation reveals -alert and oriented x 3 with no impairment of recent or remote memory. Mental Status-Normal.  Musculoskeletal Normal Exam - Left-Upper Extremity Strength Normal and Lower Extremity Strength Normal. Normal Exam - Right-Upper Extremity Strength Normal and Lower Extremity Strength Normal.  Lymphatic Head & Neck  General Head & Neck  Lymphatics: Bilateral - Description - Normal.    Assessment & Plan (Marguis Mathieson A. Natesha Hassey MD; 11/05/2017 10:32 AM)  VENTRAL HERNIA WITHOUT OBSTRUCTION OR GANGRENE (K43.9) Impression: Recommended open repair with mesh. Risks, benefits and other options of surgery discussed versus observation. Use of mesh discussing pros and cons of this discuss with patient. The risk of hernia repair include bleeding, infection, organ injury, bowel injury, bladder injury, nerve injury recurrent hernia, blood clots, worsening of underlying condition, chronic pain, mesh use, open surgery, death, and the need for other operattions. Pt agrees to proceed  Current Plans You are being scheduled for surgery- Our schedulers  will call you.  You should hear from our office's scheduling department within 5 working days about the location, date, and time of surgery. We try to make accommodations for patient's preferences in scheduling surgery, but sometimes the OR schedule or the surgeon's schedule prevents [place] from making those accommodations.  If you have not heard from our office [phone]) in 5 working days, call the office and ask for your surgeon's nurse.  If you have other questions about your diagnosis, plan, or surgery, call the office and ask for your surgeon's nurse.  Pt Education - Pamphlet Given - Hernia Surgery: discussed with patient and provided information. The anatomy & physiology of the abdominal wall was discussed. The pathophysiology of hernias was discussed. Natural history risks without surgery including progeressive enlargement, pain, incarceration, & strangulation was discussed. Contributors to complications such as smoking, obesity, diabetes, prior surgery, etc were discussed.  I feel the risks of no intervention will lead to serious problems that outweigh the operative risks; therefore, I recommended surgery to reduce and repair the hernia. I explained laparoscopic techniques with possible need  for an open approach. I noted the probable use of mesh to patch and/or buttress the hernia repair  Risks such as bleeding, infection, abscess, need for further treatment, heart attack, death, and other risks were discussed. I noted a good likelihood this will help address the problem. Goals of post-operative recovery were discussed as well. Possibility that this will not correct all symptoms was explained. I stressed the importance of low-impact activity, aggressive pain control, avoiding constipation, & not pushing through pain to minimize risk of post-operative chronic pain or injury. Possibility of reherniation especially with smoking, obesity, diabetes, immunosuppression, and other health conditions was discussed. We will work to minimize complications.  An educational handout further explaining the pathology & treatment options was given as well. Questions were answered. The patient expresses understanding & wishes to proceed with surgery.  Pt Education - CCS Mesh education: discussed with patient and provided information.

## 2017-11-20 ENCOUNTER — Encounter: Payer: Self-pay | Admitting: Nurse Practitioner

## 2017-11-20 ENCOUNTER — Other Ambulatory Visit: Payer: Self-pay

## 2017-11-20 ENCOUNTER — Ambulatory Visit: Payer: Medicaid Other | Attending: Nurse Practitioner | Admitting: Nurse Practitioner

## 2017-11-20 ENCOUNTER — Encounter (HOSPITAL_BASED_OUTPATIENT_CLINIC_OR_DEPARTMENT_OTHER): Payer: Self-pay | Admitting: *Deleted

## 2017-11-20 VITALS — BP 121/87 | HR 95 | Temp 98.7°F | Ht 61.0 in | Wt 209.2 lb

## 2017-11-20 DIAGNOSIS — E1140 Type 2 diabetes mellitus with diabetic neuropathy, unspecified: Secondary | ICD-10-CM | POA: Diagnosis present

## 2017-11-20 DIAGNOSIS — M79604 Pain in right leg: Secondary | ICD-10-CM

## 2017-11-20 DIAGNOSIS — Z808 Family history of malignant neoplasm of other organs or systems: Secondary | ICD-10-CM | POA: Diagnosis not present

## 2017-11-20 DIAGNOSIS — I10 Essential (primary) hypertension: Secondary | ICD-10-CM | POA: Insufficient documentation

## 2017-11-20 DIAGNOSIS — Z794 Long term (current) use of insulin: Secondary | ICD-10-CM | POA: Insufficient documentation

## 2017-11-20 DIAGNOSIS — Z79899 Other long term (current) drug therapy: Secondary | ICD-10-CM | POA: Diagnosis not present

## 2017-11-20 DIAGNOSIS — K219 Gastro-esophageal reflux disease without esophagitis: Secondary | ICD-10-CM | POA: Insufficient documentation

## 2017-11-20 DIAGNOSIS — J45909 Unspecified asthma, uncomplicated: Secondary | ICD-10-CM | POA: Diagnosis not present

## 2017-11-20 DIAGNOSIS — L02421 Furuncle of right axilla: Secondary | ICD-10-CM | POA: Diagnosis not present

## 2017-11-20 DIAGNOSIS — M79605 Pain in left leg: Secondary | ICD-10-CM | POA: Diagnosis not present

## 2017-11-20 DIAGNOSIS — J309 Allergic rhinitis, unspecified: Secondary | ICD-10-CM

## 2017-11-20 DIAGNOSIS — J4520 Mild intermittent asthma, uncomplicated: Secondary | ICD-10-CM

## 2017-11-20 LAB — POCT GLYCOSYLATED HEMOGLOBIN (HGB A1C): HEMOGLOBIN A1C: 11.8 % — AB (ref 4.0–5.6)

## 2017-11-20 LAB — GLUCOSE, POCT (MANUAL RESULT ENTRY): POC Glucose: 296 mg/dl — AB (ref 70–99)

## 2017-11-20 MED ORDER — OLOPATADINE HCL 0.2 % OP SOLN: OPHTHALMIC | 0 refills | Status: DC

## 2017-11-20 MED ORDER — INSULIN ASPART 100 UNIT/ML FLEXPEN: 15.0000 [IU] | PEN_INJECTOR | Freq: Three times a day (TID) | SUBCUTANEOUS | 3 refills | Status: DC

## 2017-11-20 MED ORDER — ALBUTEROL SULFATE HFA 108 (90 BASE) MCG/ACT IN AERS: 1.0000 | INHALATION_SPRAY | Freq: Four times a day (QID) | RESPIRATORY_TRACT | 0 refills | Status: DC | PRN

## 2017-11-20 MED ORDER — INSULIN PEN NEEDLE 29G X 12.7MM MISC: 11 refills | Status: DC

## 2017-11-20 MED ORDER — LANTUS SOLOSTAR 100 UNIT/ML ~~LOC~~ SOPN: PEN_INJECTOR | SUBCUTANEOUS | 2 refills | Status: DC

## 2017-11-20 MED FILL — NOVOLOG FLEXPEN SYRINGE: 100 | 33 days supply | Qty: 15 | Fill #0

## 2017-11-20 MED FILL — LANTUS SOLOSTAR 100 UNITS/M: 100 | 22 days supply | Qty: 15 | Fill #0

## 2017-11-20 MED FILL — PATADAY 0.2% EYE DROPS: 0.2 | 18 days supply | Qty: 3 | Fill #0

## 2017-11-20 MED FILL — PROVENTIL HFA 108 (90 BASE): 108 (90 BAS | 25 days supply | Qty: 7 | Fill #0

## 2017-11-20 NOTE — Progress Notes (Signed)
Assessment & Plan:  Sarah Garrison was seen today for establish care and medication refill.  Diagnoses and all orders for this visit:  Type 2 diabetes mellitus with diabetic neuropathy, unspecified whether long term insulin use (HCC) -     Glucose (CBG) -     HgB A1c -     Ambulatory referral to Endocrinology -     Ambulatory referral to Podiatry -     insulin aspart (NOVOLOG) 100 UNIT/ML FlexPen; Inject 15 Units into the skin 3 (three) times daily with meals. Take with largest meal. -     LANTUS SOLOSTAR 100 UNIT/ML Solostar Pen; 34 units twice daily -     Insulin Pen Needle (ULTICARE PEN NEEDLES) 29G X 12.7MM MISC; Use as instructed Continue blood sugar control as discussed in office today, low carbohydrate diet, and regular physical exercise as tolerated, 150 minutes per week (30 min each day, 5 days per week, or 50 min 3 days per week). Keep blood sugar logs with fasting goal of 80-130 mg/dl, post prandial less than 180.  For Hypoglycemia: BS <60 and Hyperglycemia BS >400; contact the clinic ASAP. Annual eye exams and foot exams are recommended.   Furuncle of right axilla -     Ambulatory referral to General Surgery  Allergic rhinitis, unspecified seasonality, unspecified trigger -     Olopatadine HCl 0.2 % SOLN; Apply 1 drop to each eye daily  Asthma -     albuterol (PROVENTIL HFA;VENTOLIN HFA) 108 (90 Base) MCG/ACT inhaler; Inhale 1-2 puffs into the lungs every 6 (six) hours as needed for wheezing or shortness of breath. Discussed Green, Yellow and Red zones   Patient has been counseled on age-appropriate routine health concerns for screening and prevention. These are reviewed and up-to-date. Referrals have been placed accordingly. Immunizations are up-to-date or declined.    Subjective:   Chief Complaint  Patient presents with  . Establish Care    Pt. is here to establish care for diabetes and hypertension. Pt. stated she have a boil under her arm and would like to see if she  can get an anitbiotic for it.  . Medication Refill   HPI Sarah Garrison 37 y.o. female presents to office today to establish care. She has a history of DM, Asthma, GERD and HTN. She was seen in the UC on 10-27-2017 with complaints of R axillary boil.  She was treated with doxycycline, has been using warm compresses, and today reports the boil has not improved and remains swollen and painful to touch.   DM Poorly controlled. She was diagnosed last year.  She does not take metformin due to GI upset. She reports home glucose readings 200-400s. She endorses dietary compliance however I am not sure how forthcoming she is regarding this. Taking Lantus 32 units BID and Novolog 10 units TID (increasing to 15 units TID today) with no improvement in her A1c or blood glucose readings. She was taking Trulicity however reports she could not tolerate it. She endorses a heaviness and stabbing feeling in her legs with R>L. She is a current smoker so will order ABI study.  She is not exercise compliant. I feel her A1c would be better controlled with endocrinology as she is interested in a SQ diabetes pump for her insulin.  Lab Results  Component Value Date   HGBA1C 11.8 (A) 11/20/2017   Lab Results  Component Value Date   HGBA1C 11.0 08/14/2017   Asthma Chronic. Well controlled on proventil as needed. She  currently denies any shortness of breath, wheezing or cough.   HTN Chronic. Well controlled on lisinopril 15m. She endorses medication compliance. Denies chest pain, shortness of breath, palpitations, lightheadedness, dizziness, headaches or BLE edema.  BP Readings from Last 3 Encounters:  11/20/17 121/87  10/27/17 132/90  10/26/17 (!) 146/104   Allergic Rhinitis : Sarah Garrison here for evaluation of possible allergic conjunctivitis. Patient's symptoms include itchy eyes, swelling of eyes and watery eyes. These symptoms are seasonal. Current triggers include exposure to no known precipitant.  The patient has been suffering from these symptoms for approximately a few days. The patient has tried nothing. Immunotherapy has never been tried. The patient has never had nasal polyps. The patient has a history of asthma. The patient does not suffer from frequent sinopulmonary infections. The patient has not had sinus surgery in the past. The patient has no history of eczema.    Review of Systems  Constitutional: Negative for fever, malaise/fatigue and weight loss.  HENT: Negative.  Negative for nosebleeds.   Eyes: Positive for redness. Negative for blurred vision, double vision and photophobia.  Respiratory: Negative.  Negative for cough and shortness of breath.   Cardiovascular: Negative.  Negative for chest pain, palpitations and leg swelling.  Gastrointestinal: Negative.  Negative for heartburn, nausea and vomiting.  Musculoskeletal: Positive for joint pain. Negative for myalgias.  Neurological: Positive for tingling and sensory change. Negative for dizziness, focal weakness, seizures and headaches.  Endo/Heme/Allergies: Positive for environmental allergies.  Psychiatric/Behavioral: Negative.  Negative for suicidal ideas.    Past Medical History:  Diagnosis Date  . Asthma   . [place] mellitus without complication [place]   . GERD (gastroesophageal reflux disease)   . Hypertension     Past Surgical History:  Procedure Laterality Date  . CESAREAN SECTION    . TUBAL LIGATION    . WISDOM TOOTH EXTRACTION      Family History  Problem Relation Age of Onset  . Asthma Mother   . Cancer Mother        brain cancer  . Kidney failure Mother   . Asthma Father     Social History Reviewed with no changes to be made today.   Outpatient Medications Prior to Visit  Medication Sig Dispense Refill  . Blood Glucose Monitoring Suppl (ACCU-CHEK AVIVA) device Use as instructed 3 times daily 1 each 0  . Blood Glucose Monitoring Suppl (TRUE METRIX METER) w/Device KIT Use as directed 1 kit 0    . gabapentin (NEURONTIN) 300 MG capsule Take 2 capsules (600 mg total) by mouth 3 (three) times daily. 180 capsule 3  . glucose blood (TRUE METRIX BLOOD GLUCOSE TEST) test strip Use as instructed 100 each 12  . ibuprofen (ADVIL,MOTRIN) 800 MG tablet Take 1 tablet (800 mg total) by mouth every 8 (eight) hours as needed. 60 tablet 0  . lisinopril (PRINIVIL,ZESTRIL) 10 MG tablet Take 1 tablet (10 mg total) by mouth daily. 90 tablet 3  . loperamide (IMODIUM A-D) 2 MG tablet Take 1 tablet (2 mg total) by mouth 4 (four) times daily as needed for diarrhea or loose stools. 30 tablet 0  . methocarbamol (ROBAXIN) 500 MG tablet Take 2 tablets (1,000 mg total) by mouth every 6 (six) hours as needed for muscle spasms. 90 tablet 1  . nystatin (NYSTATIN) powder Apply topically 3 (three) times daily. 45 g 0  . TRUEPLUS LANCETS 28G MISC Use as directed 100 each 12  . acetaminophen (TYLENOL) 500 MG tablet Take  1,000 mg by mouth daily as needed.    Marland Kitchen albuterol (PROVENTIL HFA;VENTOLIN HFA) 108 (90 Base) MCG/ACT inhaler Inhale 1-2 puffs into the lungs every 6 (six) hours as needed for wheezing or shortness of breath. 1 Inhaler 0  . chlorhexidine (PERIDEX) 0.12 % solution Use as directed 15 mLs in the mouth or throat 2 (two) times daily. 120 mL 0  . insulin aspart (NOVOLOG) 100 UNIT/ML FlexPen Inject 10 Units into the skin 3 (three) times daily with meals. Take with largest meal. 6 mL 3  . LANTUS SOLOSTAR 100 UNIT/ML Solostar Pen 45 units twice daily 15 mL 2  . TRUEPLUS PEN NEEDLES 32G X 4 MM MISC Test 3x per day 100 each 5  . metoCLOPramide (REGLAN) 10 MG tablet Take 1 tablet (10 mg total) by mouth every 8 (eight) hours as needed for nausea. (Patient not taking: Reported on 11/20/2017) 30 tablet 0  . omeprazole (PRILOSEC) 20 MG capsule Take 1 capsule (20 mg total) by mouth daily. (Patient not taking: Reported on 10/16/2017) 30 capsule 3  . ondansetron (ZOFRAN) 8 MG tablet Take 1 tablet (8 mg total) by mouth every 8  (eight) hours as needed for nausea or vomiting. (Patient not taking: Reported on 07/16/2017) 20 tablet 0  . doxycycline (VIBRA-TABS) 100 MG tablet Take 1 tablet (100 mg total) by mouth 2 (two) times daily. 20 tablet 0  . fluconazole (DIFLUCAN) 150 MG tablet Take one tablet daily for 5 days 5 tablet 5  . metFORMIN (GLUCOPHAGE-XR) 500 MG 24 hr tablet Take 1 tablet (500 mg total) by mouth 2 (two) times daily. (Patient not taking: Reported on 11/20/2017) 180 tablet 3  . Phenylephrine-Witch Hazel (HEMORRHOIDAL COOLING) 0.25-50 % GEL Use as directed. (Patient not taking: Reported on 06/27/2017) 5.7 Tube 0   No facility-administered medications prior to visit.     Allergies  Allergen Reactions  . Morphine And Related Shortness Of Breath  . Glyburide Diarrhea and Other (See Comments)    Reaction:  Nose bleeds   . Ivp Dye [Iodinated Diagnostic Agents]     Shortness of breath.    . Oxycodone Other (See Comments)    Pt states that this medication makes her "dream about rabbits chasing" her.    . Vicodin [Hydrocodone-Acetaminophen] Other (See Comments)    Pt states that this medication makes her "dream about rabbits chasing" her.         Objective:    BP 121/87 (BP Location: Right Arm, Patient Position: Sitting, Cuff Size: Normal)   Pulse 95   Temp 98.7 F (37.1 C) (Oral)   Ht _0  (1.549 m)   Wt 209 lb 3.2 oz (94.9 kg)   SpO2 98%   BMI 39.53 kg/m  Wt Readings from Last 3 Encounters:  11/20/17 209 lb 3.2 oz (94.9 kg)  10/26/17 216 lb (98 kg)  10/16/17 217 lb 6.4 oz (98.6 kg)    Physical Exam  Constitutional: She is oriented to person, place, and time. She appears well-developed and well-nourished. She is cooperative.  HENT:  Head: Normocephalic and atraumatic.  Eyes: EOM are normal. Right conjunctiva is injected. Right conjunctiva has no hemorrhage. Left conjunctiva is injected. Left conjunctiva has no hemorrhage.  Neck: Normal range of motion.  Cardiovascular: Normal rate, regular  rhythm, normal heart sounds and intact distal pulses. Exam reveals no gallop and no friction rub.  No murmur heard. Pulses:      Dorsalis pedis pulses are 1+ on the right side, and 1+ on  the left side.       Posterior tibial pulses are 1+ on the right side, and 1+ on the left side.  Pulmonary/Chest: Effort normal and breath sounds normal. No tachypnea. No respiratory distress. She has no decreased breath sounds. She has no wheezes. She has no rhonchi. She has no rales. She exhibits no tenderness.  Abdominal: Soft. Bowel sounds are normal.  Musculoskeletal: Normal range of motion. She exhibits no edema or tenderness.       Right foot: There is deformity (toenail of great toe is extremely long and needs to be trimmed down).       Left foot: There is no deformity.  Feet:  Right Foot:  Skin Integrity: Positive for dry skin.  Left Foot:  Skin Integrity: Positive for dry skin.  Neurological: She is alert and oriented to person, place, and time. Coordination normal.  Skin: Skin is warm and dry.  Psychiatric: She has a normal mood and affect. Her behavior is normal. Judgment and thought content normal.  Nursing note and vitals reviewed.        Patient has been counseled extensively about nutrition and exercise as well as the importance of adherence with medications and regular follow-up. The patient was given clear instructions to go to ER or return to medical center if symptoms don't improve, worsen or new problems develop. The patient verbalized understanding.   Follow-up: Return in about 3 months (around 02/20/2018) for Please refer to [facility] podiatry; please schedule with [place] for ABI.   Gildardo Pounds, FNP-BC [facility] and [place], [place]   11/23/2017, 10:39 PM

## 2017-11-20 NOTE — Progress Notes (Signed)
11.8296 

## 2017-11-20 NOTE — Progress Notes (Signed)
Pt coming tomorrow for CBC,DIFF,CMET and to pick up Ensure. Bring all medications day of surgery.

## 2017-11-20 NOTE — Patient Instructions (Signed)

## 2017-11-21 ENCOUNTER — Ambulatory Visit: Payer: Medicaid Other

## 2017-11-21 ENCOUNTER — Encounter (HOSPITAL_BASED_OUTPATIENT_CLINIC_OR_DEPARTMENT_OTHER)
Admission: RE | Admit: 2017-11-21 | Discharge: 2017-11-21 | Disposition: A | Discharge status: Home / Self Care | Payer: Medicaid Other | Source: Ambulatory Visit | Attending: Surgery | Admitting: Surgery

## 2017-11-21 DIAGNOSIS — Z01812 Encounter for preprocedural laboratory examination: Secondary | ICD-10-CM | POA: Diagnosis present

## 2017-11-21 LAB — COMPREHENSIVE METABOLIC PANEL
ALBUMIN: 4 g/dL (ref 3.5–5.0)
ALK PHOS: 138 U/L — AB (ref 38–126)
ALT: 15 U/L (ref 14–54)
AST: 12 U/L — AB (ref 15–41)
Anion gap: 8 (ref 5–15)
BILIRUBIN TOTAL: 0.6 mg/dL (ref 0.3–1.2)
BUN: 9 mg/dL (ref 6–20)
CALCIUM: 9.6 mg/dL (ref 8.9–10.3)
CO2: 23 mmol/L (ref 22–32)
CREATININE: 0.63 mg/dL (ref 0.44–1.00)
Chloride: 105 mmol/L (ref 101–111)
GFR calc Af Amer: >60 mL/min (ref >60)
GLUCOSE: 345 mg/dL — AB (ref 65–99)
POTASSIUM: 4.7 mmol/L (ref 3.5–5.1)
Sodium: 136 mmol/L (ref 135–145)
TOTAL PROTEIN: 6.6 g/dL (ref 6.5–8.1)

## 2017-11-21 LAB — CBC WITH DIFFERENTIAL/PLATELET
Abs Immature Granulocytes: 0 10*3/uL (ref 0.0–0.1)
Basophils Absolute: 0.1 10*3/uL (ref 0.0–0.1)
Basophils Relative: 1 %
EOS ABS: 0.1 10*3/uL (ref 0.0–0.7)
EOS PCT: 1 %
HEMATOCRIT: 46.4 % — AB (ref 36.0–46.0)
HEMOGLOBIN: 15.5 g/dL — AB (ref 12.0–15.0)
IMMATURE GRANULOCYTES: 1 %
LYMPHS ABS: 3.6 10*3/uL (ref 0.7–4.0)
LYMPHS PCT: 41 %
MCH: 29.8 pg (ref 26.0–34.0)
MCHC: 33.4 g/dL (ref 30.0–36.0)
MCV: 89.1 fL (ref 78.0–100.0)
Monocytes Absolute: 0.4 10*3/uL (ref 0.1–1.0)
Monocytes Relative: 5 %
Neutro Abs: 4.6 10*3/uL (ref 1.7–7.7)
Neutrophils Relative %: 51 %
Platelets: 288 10*3/uL (ref 150–400)
RBC: 5.21 MIL/uL — ABNORMAL HIGH (ref 3.87–5.11)
RDW: 11.7 % (ref 11.5–15.5)
WBC: 8.8 10*3/uL (ref 4.0–10.5)

## 2017-11-21 NOTE — Progress Notes (Addendum)
Ensure pre surgery drink given with instructions to complete by 0900 dos, pt verbalized understanding.  Lab results reviewed by Dr. Malen GauzeFoster, will proceed with surgery as scheduled.

## 2017-11-23 ENCOUNTER — Encounter: Payer: Self-pay | Admitting: Nurse Practitioner

## 2017-11-23 MED ORDER — ATORVASTATIN CALCIUM 20 MG PO TABS: 20.0000 mg | ORAL_TABLET | Freq: Every day | ORAL | 3 refills | Status: DC

## 2017-11-25 MED FILL — ATORVASTATIN 20 MG TABLET: 20 | 30 days supply | Qty: 30 | Fill #0

## 2017-11-26 ENCOUNTER — Telehealth: Payer: Self-pay

## 2017-11-26 ENCOUNTER — Encounter (HOSPITAL_BASED_OUTPATIENT_CLINIC_OR_DEPARTMENT_OTHER): Payer: Self-pay | Admitting: Certified Registered"

## 2017-11-26 NOTE — Anesthesia Preprocedure Evaluation (Addendum)
Anesthesia Evaluation  Patient identified by MRN, date of birth, ID band Patient awake    Reviewed: Allergy & Precautions, NPO status , Patient's Chart, lab work & pertinent test results  Airway Mallampati: II  TM Distance: >3 FB Neck ROM: Full    Dental no notable dental hx. (+) Teeth Intact   Pulmonary asthma , Current Smoker,    Pulmonary exam normal breath sounds clear to auscultation       Cardiovascular hypertension, Pt. on medications Normal cardiovascular exam Rhythm:Regular Rate:Normal     Neuro/Psych negative neurological ROS  negative psych ROS   GI/Hepatic Neg liver ROS, GERD  Medicated,  Endo/Other  diabetes, Type 1, Insulin DependentMorbid obesity  Renal/GU negative Renal ROS     Musculoskeletal   Abdominal (+) + obese,   Peds  Hematology negative hematology ROS (+)   Anesthesia Other Findings   Reproductive/Obstetrics negative OB ROS                            Anesthesia Physical Anesthesia Plan  ASA: III  Anesthesia Plan: General   Post-op Pain Management:    Induction: Intravenous  PONV Risk Score and Plan: 0 and Treatment may vary due to age or medical condition, Ondansetron and Dexamethasone  Airway Management Planned: Oral ETT  Additional Equipment:   Intra-op Plan:   Post-operative Plan: Extubation in OR  Informed Consent: I have reviewed the patients History and Physical, chart, labs and discussed the procedure including the risks, benefits and alternatives for the proposed anesthesia with the patient or authorized representative who has indicated his/her understanding and acceptance.   Dental advisory given  Plan Discussed with: CRNA  Anesthesia Plan Comments: (0.5 mcg/Kg of precedex for multimodal)        Anesthesia Quick Evaluation

## 2017-11-26 NOTE — Telephone Encounter (Signed)
CMA spoke to patient to inform her Rx, cholesterol medication is ready for her to pick up.   Patient understood.

## 2017-11-27 ENCOUNTER — Ambulatory Visit (HOSPITAL_BASED_OUTPATIENT_CLINIC_OR_DEPARTMENT_OTHER): Payer: Medicaid Other | Admitting: Certified Registered"

## 2017-11-27 ENCOUNTER — Encounter (HOSPITAL_BASED_OUTPATIENT_CLINIC_OR_DEPARTMENT_OTHER): Payer: Self-pay | Admitting: *Deleted

## 2017-11-27 ENCOUNTER — Other Ambulatory Visit: Payer: Self-pay

## 2017-11-27 ENCOUNTER — Encounter (HOSPITAL_BASED_OUTPATIENT_CLINIC_OR_DEPARTMENT_OTHER)
Admission: RE | Disposition: A | Discharge status: Home / Self Care | Payer: Self-pay | Source: Ambulatory Visit | Attending: Surgery

## 2017-11-27 ENCOUNTER — Ambulatory Visit (HOSPITAL_BASED_OUTPATIENT_CLINIC_OR_DEPARTMENT_OTHER)
Admission: RE | Admit: 2017-11-27 | Discharge: 2017-11-27 | Disposition: A | Discharge status: Home / Self Care | Payer: Medicaid Other | Source: Ambulatory Visit | Attending: Surgery | Admitting: Surgery

## 2017-11-27 DIAGNOSIS — Z791 Long term (current) use of non-steroidal anti-inflammatories (NSAID): Secondary | ICD-10-CM | POA: Insufficient documentation

## 2017-11-27 DIAGNOSIS — Z79899 Other long term (current) drug therapy: Secondary | ICD-10-CM | POA: Insufficient documentation

## 2017-11-27 DIAGNOSIS — K439 Ventral hernia without obstruction or gangrene: Secondary | ICD-10-CM | POA: Diagnosis not present

## 2017-11-27 DIAGNOSIS — I10 Essential (primary) hypertension: Secondary | ICD-10-CM | POA: Insufficient documentation

## 2017-11-27 DIAGNOSIS — E109 Type 1 diabetes mellitus without complications: Secondary | ICD-10-CM | POA: Insufficient documentation

## 2017-11-27 DIAGNOSIS — K219 Gastro-esophageal reflux disease without esophagitis: Secondary | ICD-10-CM | POA: Diagnosis not present

## 2017-11-27 DIAGNOSIS — Z794 Long term (current) use of insulin: Secondary | ICD-10-CM | POA: Diagnosis not present

## 2017-11-27 DIAGNOSIS — J45909 Unspecified asthma, uncomplicated: Secondary | ICD-10-CM | POA: Insufficient documentation

## 2017-11-27 DIAGNOSIS — F17200 Nicotine dependence, unspecified, uncomplicated: Secondary | ICD-10-CM | POA: Diagnosis not present

## 2017-11-27 DIAGNOSIS — Z6835 Body mass index (BMI) 35.0-35.9, adult: Secondary | ICD-10-CM | POA: Insufficient documentation

## 2017-11-27 HISTORY — PX: VENTRAL HERNIA REPAIR: SHX424

## 2017-11-27 LAB — GLUCOSE, CAPILLARY
GLUCOSE-CAPILLARY: 248 mg/dL — AB (ref 70–99)
Glucose-Capillary: 353 mg/dL — ABNORMAL HIGH (ref 70–99)
Glucose-Capillary: 275 mg/dL — ABNORMAL HIGH (ref 70–99)

## 2017-11-27 LAB — POCT PREGNANCY, URINE: PREG TEST UR: NEGATIVE

## 2017-11-27 SURGERY — REPAIR, HERNIA, VENTRAL
Anesthesia: General | Site: Abdomen

## 2017-11-27 MED ORDER — MIDAZOLAM HCL 2 MG/2ML IJ SOLN
INTRAMUSCULAR | Status: AC
  Filled 2017-11-27: qty 2

## 2017-11-27 MED ORDER — FENTANYL CITRATE (PF) 100 MCG/2ML IJ SOLN
INTRAMUSCULAR | Status: AC
  Filled 2017-11-27: qty 2

## 2017-11-27 MED ORDER — GABAPENTIN 300 MG PO CAPS
ORAL_CAPSULE | ORAL | Status: AC
  Filled 2017-11-27: qty 1

## 2017-11-27 MED ORDER — LACTATED RINGERS IV SOLN
INTRAVENOUS | Status: DC
  Administered 2017-11-27: 12:00:00 via INTRAVENOUS

## 2017-11-27 MED ORDER — ONDANSETRON HCL 4 MG/2ML IJ SOLN
INTRAMUSCULAR | Status: DC | PRN
  Administered 2017-11-27: 4 mg via INTRAVENOUS

## 2017-11-27 MED ORDER — ACETAMINOPHEN 500 MG PO TABS
ORAL_TABLET | ORAL | Status: AC
  Filled 2017-11-27: qty 2

## 2017-11-27 MED ORDER — BUPIVACAINE-EPINEPHRINE (PF) 0.25% -1:200000 IJ SOLN
INTRAMUSCULAR | Status: DC | PRN
  Administered 2017-11-27: 10 mL

## 2017-11-27 MED ORDER — IBUPROFEN 800 MG PO TABS: 800.0000 mg | ORAL_TABLET | Freq: Three times a day (TID) | ORAL | 0 refills | Status: DC | PRN

## 2017-11-27 MED ORDER — ROCURONIUM BROMIDE 100 MG/10ML IV SOLN
INTRAVENOUS | Status: DC | PRN
  Administered 2017-11-27: 50 mg via INTRAVENOUS

## 2017-11-27 MED ORDER — DEXTROSE 5 % IV SOLN
3.0000 g | INTRAVENOUS | Status: AC
  Administered 2017-11-27: 3 g via INTRAVENOUS

## 2017-11-27 MED ORDER — ROCURONIUM BROMIDE 10 MG/ML (PF) SYRINGE
PREFILLED_SYRINGE | INTRAVENOUS | Status: AC
Start: 2017-11-27 — End: ?
  Filled 2017-11-27: qty 10

## 2017-11-27 MED ORDER — CEFAZOLIN SODIUM-DEXTROSE 1-4 GM/50ML-% IV SOLN
INTRAVENOUS | Status: AC
Start: 2017-11-27 — End: ?
  Filled 2017-11-27: qty 50

## 2017-11-27 MED ORDER — DEXAMETHASONE SODIUM PHOSPHATE 4 MG/ML IJ SOLN
INTRAMUSCULAR | Status: DC | PRN
  Administered 2017-11-27: 4 mg via INTRAVENOUS

## 2017-11-27 MED ORDER — CHLORHEXIDINE GLUCONATE CLOTH 2 % EX PADS: 6.0000 | MEDICATED_PAD | Freq: Once | CUTANEOUS | Status: DC

## 2017-11-27 MED ORDER — LIDOCAINE HCL (CARDIAC) PF 100 MG/5ML IV SOSY
PREFILLED_SYRINGE | INTRAVENOUS | Status: DC | PRN
  Administered 2017-11-27: 60 mg via INTRAVENOUS

## 2017-11-27 MED ORDER — FENTANYL CITRATE (PF) 100 MCG/2ML IJ SOLN: 50.0000 ug | INTRAMUSCULAR | Status: DC | PRN

## 2017-11-27 MED ORDER — PROMETHAZINE HCL 25 MG/ML IJ SOLN: 6.2500 mg | INTRAMUSCULAR | Status: DC | PRN

## 2017-11-27 MED ORDER — HYDROMORPHONE HCL 1 MG/ML IJ SOLN
INTRAMUSCULAR | Status: AC
  Filled 2017-11-27: qty 0.5

## 2017-11-27 MED ORDER — SUGAMMADEX SODIUM 200 MG/2ML IV SOLN
INTRAVENOUS | Status: AC
  Filled 2017-11-27: qty 2

## 2017-11-27 MED ORDER — CEFAZOLIN SODIUM-DEXTROSE 2-4 GM/100ML-% IV SOLN
INTRAVENOUS | Status: AC
  Filled 2017-11-27: qty 100

## 2017-11-27 MED ORDER — HYDROMORPHONE HCL 1 MG/ML IJ SOLN
0.2500 mg | INTRAMUSCULAR | Status: DC | PRN
  Administered 2017-11-27: 0.5 mg via INTRAVENOUS

## 2017-11-27 MED ORDER — PROPOFOL 500 MG/50ML IV EMUL
INTRAVENOUS | Status: AC
  Filled 2017-11-27: qty 50

## 2017-11-27 MED ORDER — DOXYCYCLINE HYCLATE 50 MG PO CAPS: 100.0000 mg | ORAL_CAPSULE | Freq: Two times a day (BID) | ORAL | 1 refills | Status: DC

## 2017-11-27 MED ORDER — CEFAZOLIN SODIUM-DEXTROSE 2-4 GM/100ML-% IV SOLN
INTRAVENOUS | Status: AC
Start: 2017-11-27 — End: ?
  Filled 2017-11-27: qty 100

## 2017-11-27 MED ORDER — HYDROCODONE-ACETAMINOPHEN 7.5-325 MG PO TABS
1.0000 | ORAL_TABLET | Freq: Once | ORAL | Status: DC | PRN
Start: 2017-11-27 — End: 2017-11-27

## 2017-11-27 MED ORDER — MEPERIDINE HCL 25 MG/ML IJ SOLN: 6.2500 mg | INTRAMUSCULAR | Status: DC | PRN

## 2017-11-27 MED ORDER — INSULIN ASPART 100 UNIT/ML ~~LOC~~ SOLN
SUBCUTANEOUS | Status: AC
Start: 2017-11-27 — End: ?
  Filled 2017-11-27: qty 1

## 2017-11-27 MED ORDER — ACETAMINOPHEN 500 MG PO TABS
1000.0000 mg | ORAL_TABLET | ORAL | Status: AC
  Administered 2017-11-27: 1000 mg via ORAL

## 2017-11-27 MED ORDER — PROPOFOL 10 MG/ML IV BOLUS
INTRAVENOUS | Status: DC | PRN
  Administered 2017-11-27: 200 mg via INTRAVENOUS

## 2017-11-27 MED ORDER — MIDAZOLAM HCL 2 MG/2ML IJ SOLN
1.0000 mg | INTRAMUSCULAR | Status: DC | PRN
  Administered 2017-11-27: 2 mg via INTRAVENOUS

## 2017-11-27 MED ORDER — GABAPENTIN 300 MG PO CAPS
300.0000 mg | ORAL_CAPSULE | ORAL | Status: AC
  Administered 2017-11-27: 300 mg via ORAL

## 2017-11-27 MED ORDER — INSULIN ASPART 100 UNIT/ML ~~LOC~~ SOLN
8.0000 [IU] | Freq: Once | SUBCUTANEOUS | Status: AC
  Administered 2017-11-27: 8 [IU] via SUBCUTANEOUS

## 2017-11-27 MED ORDER — FENTANYL CITRATE (PF) 100 MCG/2ML IJ SOLN
INTRAMUSCULAR | Status: DC | PRN
  Administered 2017-11-27: 25 ug via INTRAVENOUS

## 2017-11-27 MED ORDER — ACETAMINOPHEN 10 MG/ML IV SOLN: 1000.0000 mg | Freq: Once | INTRAVENOUS | Status: DC | PRN

## 2017-11-27 MED ORDER — INSULIN ASPART 100 UNIT/ML ~~LOC~~ SOLN
10.0000 [IU] | Freq: Once | SUBCUTANEOUS | Status: AC
Start: 2017-11-27 — End: 2017-11-27
  Administered 2017-11-27: 10 [IU] via SUBCUTANEOUS

## 2017-11-27 MED ORDER — ONDANSETRON HCL 4 MG/2ML IJ SOLN
INTRAMUSCULAR | Status: AC
  Filled 2017-11-27: qty 10

## 2017-11-27 MED ORDER — SUGAMMADEX SODIUM 200 MG/2ML IV SOLN
INTRAVENOUS | Status: DC | PRN
  Administered 2017-11-27: 200 mg via INTRAVENOUS

## 2017-11-27 MED ORDER — DEXAMETHASONE SODIUM PHOSPHATE 10 MG/ML IJ SOLN
INTRAMUSCULAR | Status: AC
  Filled 2017-11-27: qty 3

## 2017-11-27 MED ORDER — LIDOCAINE HCL (CARDIAC) PF 100 MG/5ML IV SOSY
PREFILLED_SYRINGE | INTRAVENOUS | Status: AC
  Filled 2017-11-27: qty 15

## 2017-11-27 MED ORDER — PHENYLEPHRINE 40 MCG/ML (10ML) SYRINGE FOR IV PUSH (FOR BLOOD PRESSURE SUPPORT)
PREFILLED_SYRINGE | INTRAVENOUS | Status: AC
Start: 2017-11-27 — End: ?
  Filled 2017-11-27: qty 10

## 2017-11-27 MED ORDER — SCOPOLAMINE 1 MG/3DAYS TD PT72: 1.0000 | MEDICATED_PATCH | Freq: Once | TRANSDERMAL | Status: DC | PRN

## 2017-11-27 SURGICAL SUPPLY — 35 items
ADH SKN CLS APL DERMABOND .7 (GAUZE/BANDAGES/DRESSINGS) ×2
BLADE SURG 10 STRL SS (BLADE) ×2 IMPLANT
BLADE SURG 15 STRL LF DISP TIS (BLADE) ×2 IMPLANT
BLADE SURG 15 STRL SS (BLADE) ×3
CHLORAPREP W/TINT 26ML (MISCELLANEOUS) ×3 IMPLANT
COVER BACK TABLE 60X90IN (DRAPES) ×3 IMPLANT
COVER MAYO STAND STRL (DRAPES) ×3 IMPLANT
DERMABOND ADVANCED (GAUZE/BANDAGES/DRESSINGS) ×1
DERMABOND ADVANCED .7 DNX12 (GAUZE/BANDAGES/DRESSINGS) ×2 IMPLANT
DRAPE LAPAROTOMY TRNSV 102X78 (DRAPE) ×3 IMPLANT
DRAPE UTILITY XL STRL (DRAPES) ×3 IMPLANT
ELECT COATED BLADE 2.86 ST (ELECTRODE) ×2 IMPLANT
ELECT REM PT RETURN 9FT ADLT (ELECTROSURGICAL) ×3
ELECTRODE REM PT RTRN 9FT ADLT (ELECTROSURGICAL) ×2 IMPLANT
GLOVE BIO SURGEON STRL SZ 6.5 (GLOVE) ×2 IMPLANT
GLOVE BIOGEL PI IND STRL 7.0 (GLOVE) ×1 IMPLANT
GLOVE BIOGEL PI IND STRL 8 (GLOVE) ×2 IMPLANT
GLOVE BIOGEL PI INDICATOR 7.0 (GLOVE) ×1
GLOVE BIOGEL PI INDICATOR 8 (GLOVE) ×1
GLOVE ECLIPSE 8.0 STRL XLNG CF (GLOVE) ×3 IMPLANT
GOWN STRL REUS W/ TWL LRG LVL3 (GOWN DISPOSABLE) ×4 IMPLANT
GOWN STRL REUS W/TWL LRG LVL3 (GOWN DISPOSABLE) ×6
NDL HYPO 25X1 1.5 SAFETY (NEEDLE) ×1 IMPLANT
NEEDLE HYPO 25X1 1.5 SAFETY (NEEDLE) ×3 IMPLANT
PACK BASIN DAY SURGERY FS (CUSTOM PROCEDURE TRAY) ×3 IMPLANT
PENCIL BUTTON HOLSTER BLD 10FT (ELECTRODE) ×3 IMPLANT
SLEEVE SCD COMPRESS KNEE MED (MISCELLANEOUS) ×2 IMPLANT
SPONGE LAP 4X18 RFD (DISPOSABLE) ×3 IMPLANT
SUT MON AB 4-0 PC3 18 (SUTURE) ×3 IMPLANT
SUT NOVA NAB GS-21 1 T12 (SUTURE) ×2 IMPLANT
SUT VIC AB 2-0 SH 27 (SUTURE) ×3
SUT VIC AB 2-0 SH 27XBRD (SUTURE) ×2 IMPLANT
SYR CONTROL 10ML LL (SYRINGE) ×3 IMPLANT
TOWEL GREEN STERILE FF (TOWEL DISPOSABLE) ×3 IMPLANT
TOWEL OR NON WOVEN STRL DISP B (DISPOSABLE) ×3 IMPLANT

## 2017-11-27 NOTE — Anesthesia Procedure Notes (Signed)
Procedure Name: Intubation Date/Time: 11/27/2017 12:49 PM Performed by: Signe Colt, CRNA Pre-anesthesia Checklist: Patient identified, Emergency Drugs available, Suction available and Patient being monitored Patient Re-evaluated:Patient Re-evaluated prior to induction Oxygen Delivery Method: Circle system utilized Preoxygenation: Pre-oxygenation with 100% oxygen Induction Type: IV induction Ventilation: Mask ventilation without difficulty Laryngoscope Size: Mac and 3 Grade View: Grade I Tube type: Oral Tube size: 7.0 mm Number of attempts: 1 Airway Equipment and Method: Stylet and Oral airway Placement Confirmation: ETT inserted through vocal cords under direct vision,  positive ETCO2 and breath sounds checked- equal and bilateral Secured at: 21 cm Tube secured with: Tape Dental Injury: Teeth and Oropharynx as per pre-operative assessment

## 2017-11-27 NOTE — Interval H&P Note (Signed)
History and Physical Interval Note:  11/27/2017 12:32 PM  Sarah Garrison  has presented today for surgery, with the diagnosis of ventral hernia  The various methods of treatment have been discussed with the patient and family. After consideration of risks, benefits and other options for treatment, the patient has consented to  Procedure(s): VENTRAL HERNIA REPAIR ERAS PATHWAY (N/A) INSERTION OF MESH (N/A) as a surgical intervention .  The patient's history has been reviewed, patient examined, no change in status, stable for surgery.  I have reviewed the patient's chart and labs.  Questions were answered to the patient's satisfaction.     [name]

## 2017-11-27 NOTE — Op Note (Signed)
Preoperative diagnosis: Ventral hernia reducible nonincarcerated nonobstructing  Postoperative diagnosis: Same  Procedure: Primary repair of ventral hernia  Surgeon: Erroll Luna, MD  Anesthesia: General with local  EBL: Minimal  Drains: None  Specimens: None  IV fluids: Per anesthesia record  Indications for procedure: The patient is a 37 year old female who is morbidly obese who developed a small ventral hernia approximately 2 to 3 cm above her umbilicus.  This is causing pain and she desires repair.  Risk, benefits and other options of management were discussed.  Use of mesh and other techniques were discussed with the patient.The risk of hernia repair include bleeding,  Infection,   Recurrence of the hernia,  Mesh use, chronic pain,  Organ injury,  Bowel injury,  Bladder injury,   nerve injury with numbness around the incision,  Death,  and worsening of preexisting  medical problems.  The alternatives to surgery have been discussed as well..  Long term expectations of both operative and non operative treatments have been discussed.   The patient agrees to proceed.   Description of procedure: The patient was met in the holding area.  Questions were answered.  She is complaining of some right axillary pain and felt she had a boil when I examined her I saw no evidence of that.  She has a history of a right axillary boil with that has completely resolved there is no active infection that I can see on examination.  We elected to go ahead and proceed with surgery.  She was taken back to the operating room.  She was placed supine on the operating room table.  After induction of general anesthesia abdomen was prepped and draped in sterile fashion timeout was done.  The hernia defect was easily palpable above the umbilicus.  A 4 cm incision was made after injection of local anesthetic.  Dissection was carried down and a very small 1 cm defect was identified.  This was in the midline.  I reduced  what appeared to be pre-peritoneal fat easily and closed the defect with 2 interrupted #1 Novafil sutures.  I was able to sweep under the hernia defect and felt no other defects above and below that for approximately 4 to 5 cm.  Hemostasis achieved.  Subcutaneous fat closed with 3-0 Vicryl and 4-0 Monocryl used to close the skin in a subcuticular fashion.  All final counts were found to be correct sponge, needles and instrument.  Dermabond applied.  The patient was awoke extubated taken to recovery in satisfactory condition.

## 2017-11-27 NOTE — Discharge Instructions (Signed)
[facility] _______[facility] ° °UMBILICAL OR INGUINAL HERNIA REPAIR: POST OP INSTRUCTIONS ° °Always review your discharge instruction sheet given to you by the facility where your surgery was performed. °IF YOU HAVE DISABILITY OR FAMILY LEAVE FORMS, YOU MUST BRING THEM TO THE OFFICE FOR PROCESSING.   °DO NOT GIVE THEM TO YOUR DOCTOR. ° °1. A  prescription for pain medication may be given to you upon discharge.  Take your pain medication as prescribed, if needed.  If narcotic pain medicine is not needed, then you may take acetaminophen (Tylenol) or ibuprofen (Advil) as needed. °2. Take your usually prescribed medications unless otherwise directed. °If you need a refill on your pain medication, please contact your pharmacy.  They will contact our office to request authorization. Prescriptions will not be filled after 5 pm or on week-ends. °3. You should follow a light diet the first 24 hours after arrival home, such as soup and crackers, etc.  Be sure to include lots of fluids daily.  Resume your normal diet the day after surgery. °4.Most patients will experience some swelling and bruising around the umbilicus or in the groin and scrotum.  Ice packs and reclining will help.  Swelling and bruising can take several days to resolve.  °6. It is common to experience some constipation if taking pain medication after surgery.  Increasing fluid intake and taking a stool softener (such as Colace) will usually help or prevent this problem from occurring.  A mild laxative (Milk of Magnesia or Miralax) should be taken according to package directions if there are no bowel movements after 48 hours. °7. Unless discharge instructions indicate otherwise, you may remove your bandages 24-48 hours after surgery, and you may shower at that time.  You may have steri-strips (small skin tapes) in place directly over the incision.  These strips should be left on the skin for 7-10 days.  If your surgeon used skin glue on the  incision, you may shower in 24 hours.  The glue will flake off over the next 2-3 weeks.  Any sutures or staples will be removed at the office during your follow-up visit. °8. ACTIVITIES:  You may resume regular (light) daily activities beginning the next day--such as daily self-care, walking, climbing stairs--gradually increasing activities as tolerated.  You may have sexual intercourse when it is comfortable.  Refrain from any heavy lifting or straining until approved by your doctor. ° °a.You may drive when you are no longer taking prescription pain medication, you can comfortably wear a seatbelt, and you can safely maneuver your car and apply brakes. °b.RETURN TO WORK:   °_____________________________________________ ° °9.You should see your doctor in the office for a follow-up appointment approximately 2-3 weeks after your surgery.  Make sure that you call for this appointment within a day or two after you arrive home to insure a convenient appointment time. °10.OTHER INSTRUCTIONS: _________________________ °   _____________________________________ ° °WHEN TO CALL YOUR DOCTOR: °1. Fever over 101.0 °2. Inability to urinate °3. Nausea and/or vomiting °4. Extreme swelling or bruising °5. Continued bleeding from incision. °6. Increased pain, redness, or drainage from the incision ° °The clinic staff is available to answer your questions during regular business hours.  Please don’t hesitate to call and ask to speak to one of the nurses for clinical concerns.  If you have a medical emergency, go to the nearest emergency room or call 911.  A surgeon from [facility] is always on call at the hospital ° ° °  [address], [address], [place], [place]  [address] ? ° [address] [address], [place], [place]   [address] °[phone] ? [phone] ? FAX [phone] °Web site: [email or web address] ° ° °Post Anesthesia Home Care Instructions ° °Activity: °Get plenty of rest for the remainder of the day. A  responsible individual must stay with you for 24 hours following the procedure.  °For the next 24 hours, DO NOT: °-Drive a car °-Operate machinery °-Drink alcoholic beverages °-Take any medication unless instructed by your physician °-Make any legal decisions or sign important papers. ° °Meals: °Start with liquid foods such as gelatin or soup. Progress to regular foods as tolerated. Avoid greasy, spicy, heavy foods. If nausea and/or vomiting occur, drink only clear liquids until the nausea and/or vomiting subsides. Call your physician if vomiting continues. ° °Special Instructions/Symptoms: °Your throat may feel dry or sore from the anesthesia or the breathing tube placed in your throat during surgery. If this causes discomfort, gargle with warm salt water. The discomfort should disappear within 24 hours. ° °If you had a scopolamine patch placed behind your ear for the management of post- operative nausea and/or vomiting: ° °1. The medication in the patch is effective for 72 hours, after which it should be removed.  Wrap patch in a tissue and discard in the trash. Wash hands thoroughly with soap and water. °2. You may remove the patch earlier than 72 hours if you experience unpleasant side effects which may include dry mouth, dizziness or visual disturbances. °3. Avoid touching the patch. Wash your hands with soap and water after contact with the patch. °  ° °

## 2017-11-27 NOTE — Anesthesia Postprocedure Evaluation (Signed)
Anesthesia Post Note  Patient: Sarah Garrison  Procedure(s) Performed: VENTRAL HERNIA REPAIR ERAS PATHWAY (N/A Abdomen)     Patient location during evaluation: PACU Anesthesia Type: General Level of consciousness: awake and alert Pain management: pain level controlled Vital Signs Assessment: post-procedure vital signs reviewed and stable Respiratory status: spontaneous breathing, nonlabored ventilation, respiratory function stable and patient connected to nasal cannula oxygen Cardiovascular status: blood pressure returned to baseline and stable Postop Assessment: no apparent nausea or vomiting Anesthetic complications: no    Last Vitals:  Vitals:   11/27/17 1430 11/27/17 1445  BP: 114/81 110/77  Pulse: (!) 101 97  Resp: (!) 22 (!) 24  Temp:    SpO2: 100% 93%    Last Pain:  Vitals:   11/27/17 1445  TempSrc:   PainSc: 2                  Trevor IhaStephen A Houser

## 2017-11-27 NOTE — Transfer of Care (Signed)
Immediate Anesthesia Transfer of Care Note  Patient: Sarah Garrison Reason  Procedure(s) Performed: VENTRAL HERNIA REPAIR ERAS PATHWAY (N/A Abdomen)  Patient Location: PACU  Anesthesia Type:General  Level of Consciousness: awake and patient cooperative  Airway & Oxygen Therapy: Patient Spontanous Breathing and Patient connected to face mask oxygen  Post-op Assessment: Report given to RN and Post -op Vital signs reviewed and stable  Post vital signs: Reviewed and stable  Last Vitals:  Vitals Value Taken Time  BP 106/76 11/27/2017  1:33 PM  Temp    Pulse 108 11/27/2017  1:35 PM  Resp 25 11/27/2017  1:35 PM  SpO2 100 % 11/27/2017  1:35 PM  Vitals shown include unvalidated device data.  Last Pain:  Vitals:   11/27/17 1137  TempSrc: Oral  PainSc:          Complications: No apparent anesthesia complications

## 2017-11-28 ENCOUNTER — Encounter (HOSPITAL_BASED_OUTPATIENT_CLINIC_OR_DEPARTMENT_OTHER): Payer: Self-pay | Admitting: Surgery

## 2017-12-06 ENCOUNTER — Telehealth: Payer: Self-pay | Admitting: Nurse Practitioner

## 2017-12-06 NOTE — Telephone Encounter (Signed)
Patient called to inform nurse  -Olopatadine HCl 0.2 % SOLN  Has been causing headaches since she began using it on 11/27/2017 and she has discontinued use on 12/04/2017, please follow up with alternative options

## 2017-12-06 NOTE — Telephone Encounter (Signed)
Will route to PCP 

## 2017-12-08 NOTE — Telephone Encounter (Signed)
She can use any allergy eye drop over the counter.

## 2017-12-09 NOTE — Telephone Encounter (Signed)
CMA spoke to patient to inform on PCP advising. Pt. Understood.

## 2017-12-11 ENCOUNTER — Ambulatory Visit (HOSPITAL_COMMUNITY)
Admission: EM | Admit: 2017-12-11 | Discharge: 2017-12-11 | Disposition: A | Discharge status: Home / Self Care | Payer: Medicaid Other | Attending: Family Medicine | Admitting: Family Medicine

## 2017-12-11 ENCOUNTER — Ambulatory Visit (INDEPENDENT_AMBULATORY_CARE_PROVIDER_SITE_OTHER): Payer: Medicaid Other

## 2017-12-11 ENCOUNTER — Encounter (HOSPITAL_COMMUNITY): Payer: Self-pay | Admitting: Emergency Medicine

## 2017-12-11 DIAGNOSIS — M79675 Pain in left toe(s): Secondary | ICD-10-CM | POA: Diagnosis not present

## 2017-12-11 MED ORDER — DICLOFENAC SODIUM 75 MG PO TBEC: 75.0000 mg | DELAYED_RELEASE_TABLET | Freq: Two times a day (BID) | ORAL | 0 refills | Status: DC

## 2017-12-11 NOTE — ED Provider Notes (Signed)
[facility]   [id number] 12/11/17 Arrival Time: 1624  ASSESSMENT & PLAN:  1. Toe pain, left   - L 2nd  Imaging: Dg Foot Complete Left  Result Date: 12/11/2017 CLINICAL DATA:  Left second toe pain after surgery several weeks ago. EXAM: LEFT FOOT - COMPLETE 3+ VIEW COMPARISON:  Radiographs of August 28, 2016. FINDINGS: There is no evidence of fracture or dislocation. There is no evidence of arthropathy or other focal bone abnormality. Soft tissues are unremarkable. IMPRESSION: No significant abnormality seen in the left foot. Electronically Signed   By: Lupita RaiderJames  Green Jr, M.D.   On: 12/11/2017 17:16   Meds ordered this encounter  Medications  . diclofenac (VOLTAREN) 75 MG EC tablet    Sig: Take 1 tablet (75 mg total) by mouth 2 (two) times daily.    Dispense:  14 tablet    Refill:  0   Cast shoe given for comfort.  Follow-up Information    Claiborne RiggFleming, Zelda W, NP.   Specialty:  Nurse Practitioner Why:  If your toe is not improving over the next week. Contact information: [address] [place] [address] [phone]        [facility].   Specialty:  Urgent Care Why:  As needed. Contact information: [address] [place] [id number] [phone]          Reviewed expectations re: course of current medical issues. Questions answered. Outlined signs and symptoms indicating need for more acute intervention. Patient verbalized understanding. After Visit Summary given.  SUBJECTIVE: History from: patient. Sarah Garrison is a 37 y.o. female who reports persistent mild to moderate pain of her left 2nd toe pain that is stable; described as aching without radiation. Onset: gradual, over the past several days. Injury/trama: no. Relieved by: rest; elevating foot. Worsened by: certain movements. Associated symptoms: none reported. Extremity sensation changes or weakness: none. Self treatment: has not tried  OTCs for relief of pain. History of similar: no  ROS: As per HPI.   OBJECTIVE:  Vitals:   12/11/17 1657  BP: (!) 142/73  Pulse: (!) 105  Resp: 18  Temp: 98.2 F (36.8 C)  TempSrc: Oral  SpO2: 99%    General appearance: alert; no distress Extremities: warm and well perfused; symmetrical with no gross deformities; localized tenderness over her left 2nd toe with no swelling and no bruising; ROM: normal CV: normal extremity capillary refill Skin: warm and dry Neurologic: normal gait Psychological: alert and cooperative; normal mood and affect  Allergies  Allergen Reactions  . Morphine And Related Shortness Of Breath  . Glyburide Diarrhea and Other (See Comments)    Reaction:  Nose bleeds   . Ivp Dye [Iodinated Diagnostic Agents]     Shortness of breath.    . Oxycodone Other (See Comments)    Pt states that this medication makes her "dream about rabbits chasing" her.    . Vicodin [Hydrocodone-Acetaminophen] Other (See Comments)    Pt states that this medication makes her "dream about rabbits chasing" her.      Past Medical History:  Diagnosis Date  . Asthma   . Diabetes mellitus without complication (HCC)   . GERD (gastroesophageal reflux disease)   . Hypertension    Social History   Socioeconomic History  . Marital status: Single    Spouse name: Not on file  . Number of children: Not on file  . Years of education: Not on file  . Highest  education level: Not on file  Occupational History  . Not on file  Social Needs  . Financial resource strain: Not on file  . Food insecurity:    Worry: Not on file    Inability: Not on file  . Transportation needs:    Medical: Not on file    Non-medical: Not on file  Tobacco Use  . Smoking status: Current Every Day Smoker    Packs/day: 0.10    Types: Cigarettes    Last attempt to quit: 11/02/2013    Years since quitting: 4.1  . Smokeless tobacco: Never Used  Substance and Sexual Activity  . Alcohol use: No  . Drug use:  No  . Sexual activity: Not Currently    Birth control/protection: Surgical  Lifestyle  . Physical activity:    Days per week: Not on file    Minutes per session: Not on file  . Stress: Not on file  Relationships  . Social connections:    Talks on phone: Not on file    Gets together: Not on file    Attends religious service: Not on file    Active member of club or organization: Not on file    Attends meetings of clubs or organizations: Not on file    Relationship status: Not on file  . Intimate partner violence:    Fear of current or ex partner: Not on file    Emotionally abused: Not on file    Physically abused: Not on file    Forced sexual activity: Not on file  Other Topics Concern  . Not on file  Social History Narrative  . Not on file   Family History  Problem Relation Age of Onset  . Asthma Mother   . Cancer Mother        brain cancer  . Kidney failure Mother   . Asthma Father    Past Surgical History:  Procedure Laterality Date  . CESAREAN SECTION    . TUBAL LIGATION    . VENTRAL HERNIA REPAIR N/A 11/27/2017   Procedure: VENTRAL HERNIA REPAIR ERAS PATHWAY;  Surgeon: Harriette Bouillon, MD;  Location: [facility];  Service: General;  Laterality: N/A;  . WISDOM TOOTH EXTRACTION        Mardella Layman, MD 12/25/17 [phone]

## 2017-12-11 NOTE — ED Triage Notes (Signed)
Pt here for toe pain on left foot; pt sts started after hernia sx

## 2017-12-20 ENCOUNTER — Encounter: Payer: Self-pay | Admitting: Podiatry

## 2017-12-20 ENCOUNTER — Ambulatory Visit: Payer: Medicaid Other | Admitting: Podiatry

## 2017-12-20 VITALS — BP 119/87 | HR 102

## 2017-12-20 DIAGNOSIS — B351 Tinea unguium: Secondary | ICD-10-CM | POA: Diagnosis not present

## 2017-12-20 DIAGNOSIS — M79674 Pain in right toe(s): Secondary | ICD-10-CM

## 2017-12-20 DIAGNOSIS — M25572 Pain in left ankle and joints of left foot: Secondary | ICD-10-CM

## 2017-12-20 DIAGNOSIS — E1140 Type 2 diabetes mellitus with diabetic neuropathy, unspecified: Secondary | ICD-10-CM | POA: Diagnosis not present

## 2017-12-20 DIAGNOSIS — M79675 Pain in left toe(s): Secondary | ICD-10-CM | POA: Diagnosis not present

## 2017-12-20 DIAGNOSIS — IMO0002 Reserved for concepts with insufficient information to code with codable children: Secondary | ICD-10-CM

## 2017-12-20 DIAGNOSIS — E1165 Type 2 diabetes mellitus with hyperglycemia: Secondary | ICD-10-CM

## 2017-12-20 DIAGNOSIS — Z794 Long term (current) use of insulin: Secondary | ICD-10-CM

## 2017-12-21 LAB — URIC ACID: URIC ACID, SERUM: 3.2 mg/dL (ref 2.5–7.0)

## 2017-12-22 ENCOUNTER — Encounter: Payer: Self-pay | Admitting: Podiatry

## 2017-12-22 NOTE — Progress Notes (Signed)
Subjective: Sarah Garrison is a 37 yo AAF who presents today with cc of painful left 2nd digit since June 2019. She denies any trauma to foot/toe. She states left 2nd digit started hurting after she underwent hernia repair. She states she complained of left 2nd toe pain postoperatively. She went to Urgent Care and had xrays performed. Xrays were negative for fracture and she was given a prescription for Diclofenac sodium 80m twice daily and doxycycline. Her pain is interfering with activities of daily living. Pain is aggravated when wearing enclosed shoe gear. Pain is getting progressively worse. Patient is seeking professional help regarding symptoms.   She notes there is no improvement with diclofenac and doxycycline.  Problem List    Respiratory  Asthma   Endocrine  DM neuropathy, type II diabetes mellitus ([place]   Uncontrolled type 2 diabetes mellitus with complication, with long-term current use of insulin (HCC)   Other  Leg swelling   Morbid obesity, unspecified obesity type ([place]   Encounter for pregnancy test   Depot contraception   Non-adherence to medical treatment    Medical History   Date Unknown Asthma  Date Unknown Diabetes mellitus without complication ([place]  Date Unknown GERD (gastroesophageal reflux disease)  Date Unknown Hypertension   Surgical History    11/27/2017 Ventral hernia repair (N/A)   Date Unknown Cesarean section  Date Unknown Tubal ligation  Date Unknown Wisdom tooth extraction   Medications    albuterol (PROVENTIL HFA;VENTOLIN HFA) 108 (90 Base) MCG/ACT inhaler    atorvastatin (LIPITOR) 20 MG tablet    Blood Glucose Monitoring Suppl (ACCU-CHEK AVIVA) device    Blood Glucose Monitoring Suppl (TRUE METRIX METER) w/Device KIT    diclofenac (VOLTAREN) 75 MG EC tablet    doxycycline (VIBRAMYCIN) 50 MG capsule    gabapentin (NEURONTIN) 300 MG capsule    glucose blood (TRUE METRIX BLOOD GLUCOSE TEST) test strip    insulin aspart (NOVOLOG) 100 UNIT/ML  FlexPen    Insulin Pen Needle (ULTICARE PEN NEEDLES) 29G X 12.7MM MISC    LANTUS SOLOSTAR 100 UNIT/ML Solostar Pen    lisinopril (PRINIVIL,ZESTRIL) 10 MG tablet    loperamide (IMODIUM A-D) 2 MG tablet    methocarbamol (ROBAXIN) 500 MG tablet    metoCLOPramide (REGLAN) 10 MG tablet    nystatin (NYSTATIN) powder    Olopatadine HCl 0.2 % SOLN    omeprazole (PRILOSEC) 20 MG capsule    ondansetron (ZOFRAN) 8 MG tablet    TRUEPLUS LANCETS 28G MISC    Allergies     Morphine And RelatedShortness Of Breath  GlyburideDiarrhea, Other (See Comments)  Ivp Dye [Iodinated Diagnostic Agents]  OxycodoneOther (See Comments)  Vicodin [Hydrocodone-acetaminophen]Other (See Comments)   Family History   Mother Asthma    Cancer     Kidney failure         Father (Deceased) Asthma    Tobacco History   Smoking Status  Current Every Day Smoker  Last attempt to quit 11/02/2013  Types  Cigarettes  Amount 0.1 packs/day      Smokeless Tobacco Status  Never Used   Immunizations/Injections   New immunizations from outside sources are available for reconciliation   Influenza,inj,Quad PF,6+ Mos8/30/2017   Pneumococcal Polysaccharide-2310/26/2017   Tdap10/11/2011    Objective: Vitals:   12/20/17 0811  BP: 119/87  Pulse: (!) 102   Vascular Examination: Capillary refill time <3 seconds x 10 digits Dorsalis pedis and posterior tibial pulses present b/l No digital hair x 10 digits Skin temperature warm to warm  b/l  Dermatological Examination: Skin with normal turgor, texture and tone Toenails 1-5 b/l thick, dystrophic, discolored with subungual debris. Bilateral great toes incurvated at medial/lateral nail borders with +nail border hypertrophy. No erythema, no edema, no drainage.  Musculoskeletal: Muscle strength 5/5 to all LE muscle groups Pain on palpation left 2nd digit MPJ dorsally and plantarly. +Pain with passive ROM. No significant joint swelling, erythema, or  warmth.  Neurological: Sensation intact with 10 gram monofilament. Vibratory sensation intact.  Assessment: Painful metatarsophalangeal joint capsulitis left 2nd digit.  Xrays negative for fracture, foreign body or bony pathology.   Plan: 1. Discussed diabetic foot care principles.     Toenails 1-5 b/l debrided in length and girth. 2. Discussed treatment options available for capsulitis:       A. Localized steroid injection       B. Padding of painful joint. She declines injection of left 2nd MPJ due to her fear of needles. She would like some time to think about it.  U-pad was given to her to offload area. 3. Patient to report any pedal injuries to medical professional immediately. 4.  Uric Acid ordered today. 5.  Follow up 1 week for re-evaluation. 6.  Patient to call should there be a concern in the interim.

## 2017-12-22 NOTE — Progress Notes (Signed)
[name],  Your uric acid level was normal indicating you do not have gout.  I look forward to seeing you at your follow up visit on 12/27/17.  Warm Regards,  Dr. Eloy EndGalaway

## 2017-12-27 ENCOUNTER — Ambulatory Visit: Payer: Medicaid Other | Attending: Nurse Practitioner | Admitting: Nurse Practitioner

## 2017-12-27 ENCOUNTER — Encounter: Payer: Self-pay | Admitting: Podiatry

## 2017-12-27 ENCOUNTER — Encounter: Payer: Self-pay | Admitting: Nurse Practitioner

## 2017-12-27 ENCOUNTER — Ambulatory Visit: Payer: Medicaid Other | Admitting: Podiatry

## 2017-12-27 VITALS — BP 141/82 | HR 96 | Temp 98.2°F | Ht 61.0 in | Wt 213.0 lb

## 2017-12-27 DIAGNOSIS — Z79899 Other long term (current) drug therapy: Secondary | ICD-10-CM | POA: Diagnosis not present

## 2017-12-27 DIAGNOSIS — I10 Essential (primary) hypertension: Secondary | ICD-10-CM | POA: Diagnosis not present

## 2017-12-27 DIAGNOSIS — Z885 Allergy status to narcotic agent status: Secondary | ICD-10-CM | POA: Diagnosis not present

## 2017-12-27 DIAGNOSIS — E1140 Type 2 diabetes mellitus with diabetic neuropathy, unspecified: Secondary | ICD-10-CM | POA: Diagnosis not present

## 2017-12-27 DIAGNOSIS — Z6841 Body Mass Index (BMI) 40.0 and over, adult: Secondary | ICD-10-CM | POA: Diagnosis not present

## 2017-12-27 DIAGNOSIS — G8929 Other chronic pain: Secondary | ICD-10-CM | POA: Diagnosis present

## 2017-12-27 DIAGNOSIS — K219 Gastro-esophageal reflux disease without esophagitis: Secondary | ICD-10-CM | POA: Insufficient documentation

## 2017-12-27 DIAGNOSIS — J45909 Unspecified asthma, uncomplicated: Secondary | ICD-10-CM | POA: Diagnosis not present

## 2017-12-27 DIAGNOSIS — M549 Dorsalgia, unspecified: Secondary | ICD-10-CM | POA: Insufficient documentation

## 2017-12-27 DIAGNOSIS — M7751 Other enthesopathy of right foot: Secondary | ICD-10-CM

## 2017-12-27 DIAGNOSIS — Z794 Long term (current) use of insulin: Secondary | ICD-10-CM | POA: Insufficient documentation

## 2017-12-27 DIAGNOSIS — M79671 Pain in right foot: Secondary | ICD-10-CM | POA: Diagnosis not present

## 2017-12-27 DIAGNOSIS — M25561 Pain in right knee: Secondary | ICD-10-CM | POA: Diagnosis present

## 2017-12-27 DIAGNOSIS — M7752 Other enthesopathy of left foot: Secondary | ICD-10-CM

## 2017-12-27 DIAGNOSIS — F5101 Primary insomnia: Secondary | ICD-10-CM | POA: Diagnosis not present

## 2017-12-27 LAB — GLUCOSE, POCT (MANUAL RESULT ENTRY)
POC GLUCOSE: 404 mg/dL — AB (ref 70–99)
POC GLUCOSE: 373 mg/dL — AB (ref 70–99)

## 2017-12-27 MED ORDER — INSULIN ASPART 100 UNIT/ML ~~LOC~~ SOLN
20.0000 [IU] | Freq: Once | SUBCUTANEOUS | Status: AC
  Administered 2017-12-27: 20 [IU] via SUBCUTANEOUS

## 2017-12-27 MED ORDER — TRAZODONE HCL 100 MG PO TABS: 100.0000 mg | ORAL_TABLET | Freq: Every day | ORAL | 1 refills | Status: DC

## 2017-12-27 MED FILL — DICLOFENAC SOD EC 75 MG TAB: 75 | 7 days supply | Qty: 14 | Fill #0

## 2017-12-27 MED FILL — TRUEplus 5-BEVEL PEN NEEDLE: 31G X 8 MM | 30 days supply | Qty: 100 | Fill #0

## 2017-12-27 MED FILL — traZODone HCL 100 MG TABS: 100 | 30 days supply | Qty: 30 | Fill #0

## 2017-12-27 NOTE — Patient Instructions (Signed)

## 2017-12-27 NOTE — Progress Notes (Signed)
Assessment & Plan:  Sarah Garrison was seen today for knee pain.  Diagnoses and all orders for this visit:  Chronic pain of right knee -     DG Knee Complete 4 Views Right; Future Work on losing weight to help reduce pain. May alternate with heat and ice application for pain relief. May also alternate with acetaminophen and Ibuprofen as prescribed for back pain. Other alternatives include massage, acupuncture and water aerobics.  You must stay active and avoid a sedentary lifestyle.  Primary insomnia -     traZODone (DESYREL) 100 MG tablet; Take 1 tablet (100 mg total) by mouth at bedtime.  Type 2 diabetes mellitus with diabetic neuropathy, unspecified whether long term insulin use (HCC) -     Glucose (CBG) -     insulin aspart (novoLOG) injection 20 Units Lab Results  Component Value Date   HGBA1C 11.8 (A) 11/20/2017  Continue blood sugar control as discussed in office today, low carbohydrate diet, and regular physical exercise as tolerated, 150 minutes per week (30 min each day, 5 days per week, or 50 min 3 days per week). Keep blood sugar logs with fasting goal of 90-130 mg/dl, post prandial (after you eat) less than 180.  For Hypoglycemia: BS <60 and Hyperglycemia BS >400; contact the clinic ASAP. Annual eye exams and foot exams are recommended.   Morbid obesity, unspecified obesity type ([place]) Discussed diet and exercise for person with BMI >25. Instructed: You must burn more calories than you eat. Losing 5 percent of your body weight should be considered a success. In the longer term, losing more than 15 percent of your body weight and staying at this weight is an extremely good result. However, keep in mind that even losing 5 percent of your body weight leads to important health benefits, so try not to get discouraged if you're not able to lose more than this. Will recheck weight in 3-6 months.  Patient has been counseled on age-appropriate routine health concerns for screening and  prevention. These are reviewed and up-to-date. Referrals have been placed accordingly. Immunizations are up-to-date or declined.    Subjective:   Chief Complaint  Patient presents with  . Knee Pain    Patient is here stating her right knee are giving her pain. Patient stated when she walk up the stairs, it hurts bad.    HPI Sarah Garrison 37 y.o. female presents to office today with right knee pain.  Knee Pain Patient complains of right knee pain. The pain began 2 years ago. The pain is located medial, lateral, popliteral.  She describes the symptoms as aching, sharp and tight band. Symptoms improve with nothing. The symptoms are worse with stair climbing, kneeling, deep knee bending, getting up from a chair, weight bearing. The knee has not given out or felt unstable. The patient cannot bend and straighten the knee fully.  The patient is active in none. Treatment to date has been ice, heat, NSAID's, without significant relief.  Insomnia Difficulty in staying asleep. Has tried tylenol pm, melatonin OTC and nyquil (which seems to provide relief some inconsistent relief of symptoms). No relief from tylenol PM or Melatonin.    Review of Systems  Constitutional: Negative for fever, malaise/fatigue and weight loss.       Dry skin Xerostomia Polydipsia  Insomnia  HENT: Negative.  Negative for nosebleeds.   Eyes: Negative.  Negative for blurred vision, double vision and photophobia.  Respiratory: Negative.  Negative for cough and shortness of breath.  Cardiovascular: Negative.  Negative for chest pain, palpitations and leg swelling.  Gastrointestinal: Negative.  Negative for heartburn, nausea and vomiting.  Musculoskeletal: Positive for joint pain. Negative for myalgias.  Neurological: Negative.  Negative for dizziness, focal weakness, seizures and headaches.  Psychiatric/Behavioral: Negative.  Negative for suicidal ideas.    Past Medical History:  Diagnosis Date  . Asthma   .  Diabetes mellitus without complication ([place])   . GERD (gastroesophageal reflux disease)   . Hypertension     Past Surgical History:  Procedure Laterality Date  . CESAREAN SECTION    . TUBAL LIGATION    . VENTRAL HERNIA REPAIR N/A 11/27/2017   Procedure: [place];  Surgeon: Erroll Luna, MD;  Location: [facility];  Service: General;  Laterality: N/A;  . WISDOM TOOTH EXTRACTION      Family History  Problem Relation Age of Onset  . Asthma Mother   . Cancer Mother        brain cancer  . Kidney failure Mother   . Asthma Father     Social History Reviewed with no changes to be made today.   Outpatient Medications Prior to Visit  Medication Sig Dispense Refill  . albuterol (PROVENTIL HFA;VENTOLIN HFA) 108 (90 Base) MCG/ACT inhaler Inhale 1-2 puffs into the lungs every 6 (six) hours as needed for wheezing or shortness of breath. 1 Inhaler 0  . atorvastatin (LIPITOR) 20 MG tablet Take 1 tablet (20 mg total) by mouth daily. 90 tablet 3  . Blood Glucose Monitoring Suppl (ACCU-CHEK AVIVA) device Use as instructed 3 times daily 1 each 0  . Blood Glucose Monitoring Suppl (TRUE METRIX METER) w/Device KIT Use as directed 1 kit 0  . diclofenac (VOLTAREN) 75 MG EC tablet Take 1 tablet (75 mg total) by mouth 2 (two) times daily. 14 tablet 0  . gabapentin (NEURONTIN) 300 MG capsule Take 2 capsules (600 mg total) by mouth 3 (three) times daily. 180 capsule 3  . glucose blood (TRUE METRIX BLOOD GLUCOSE TEST) test strip Use as instructed 100 each 12  . insulin aspart (NOVOLOG) 100 UNIT/ML FlexPen Inject 15 Units into the skin 3 (three) times daily with meals. Take with largest meal. 6 mL 3  . Insulin Pen Needle (ULTICARE PEN NEEDLES) 29G X 12.7MM MISC Use as instructed 100 each 11  . LANTUS SOLOSTAR 100 UNIT/ML Solostar Pen 34 units twice daily 15 mL 2  . lisinopril (PRINIVIL,ZESTRIL) 10 MG tablet Take 1 tablet (10 mg total) by mouth daily. 90 tablet 3  .  loperamide (IMODIUM A-D) 2 MG tablet Take 1 tablet (2 mg total) by mouth 4 (four) times daily as needed for diarrhea or loose stools. 30 tablet 0  . methocarbamol (ROBAXIN) 500 MG tablet Take 2 tablets (1,000 mg total) by mouth every 6 (six) hours as needed for muscle spasms. 90 tablet 1  . metoCLOPramide (REGLAN) 10 MG tablet Take 1 tablet (10 mg total) by mouth every 8 (eight) hours as needed for nausea. 30 tablet 0  . nystatin (NYSTATIN) powder Apply topically 3 (three) times daily. 45 g 0  . Olopatadine HCl 0.2 % SOLN Apply 1 drop to each eye daily 2.5 mL 0  . omeprazole (PRILOSEC) 20 MG capsule Take 1 capsule (20 mg total) by mouth daily. 30 capsule 3  . ondansetron (ZOFRAN) 8 MG tablet Take 1 tablet (8 mg total) by mouth every 8 (eight) hours as needed for nausea or vomiting. 20 tablet 0  .  TRUEPLUS LANCETS 28G MISC Use as directed 100 each 12  . doxycycline (VIBRAMYCIN) 50 MG capsule Take 2 capsules (100 mg total) by mouth 2 (two) times daily. 20 capsule 1   No facility-administered medications prior to visit.     Allergies  Allergen Reactions  . Morphine And Related Shortness Of Breath  . Glyburide Diarrhea and Other (See Comments)    Reaction:  Nose bleeds   . Ivp Dye [Iodinated Diagnostic Agents]     Shortness of breath.    . Oxycodone Other (See Comments)    Pt states that this medication makes her "dream about rabbits chasing" her.    . Vicodin [Hydrocodone-Acetaminophen] Other (See Comments)    Pt states that this medication makes her "dream about rabbits chasing" her.         Objective:    BP (!) 141/82 (BP Location: Right Arm, Patient Position: Sitting, Cuff Size: Normal)   Pulse 96   Temp 98.2 F (36.8 C) (Oral)   Ht 5' 1" (1.549 m)   Wt 213 lb (96.6 kg)   SpO2 98%   BMI 40.25 kg/m  Wt Readings from Last 3 Encounters:  12/27/17 213 lb (96.6 kg)  11/27/17 209 lb (94.8 kg)  11/20/17 209 lb 3.2 oz (94.9 kg)    Physical Exam  Constitutional: She is oriented to  person, place, and time. She appears well-developed and well-nourished. She is cooperative.  HENT:  Head: Normocephalic and atraumatic.  Eyes: EOM are normal.  Neck: Normal range of motion.  Cardiovascular: Normal rate, regular rhythm and normal heart sounds. Exam reveals no gallop and no friction rub.  No murmur heard. Pulmonary/Chest: Effort normal and breath sounds normal. No tachypnea. No respiratory distress. She has no decreased breath sounds. She has no wheezes. She has no rhonchi. She has no rales. She exhibits no tenderness.  Abdominal: Bowel sounds are normal.  Musculoskeletal: She exhibits no edema or deformity.       Right knee: She exhibits decreased range of motion. She exhibits no swelling, no erythema and normal alignment. Tenderness found. Medial joint line, lateral joint line and patellar tendon tenderness noted.  Neurological: She is alert and oriented to person, place, and time. Coordination normal.  Skin: Skin is warm and dry.  Psychiatric: She has a normal mood and affect. Her behavior is normal. Judgment and thought content normal.  Nursing note and vitals reviewed.      Patient has been counseled extensively about nutrition and exercise as well as the importance of adherence with medications and regular follow-up. The patient was given clear instructions to go to ER or return to medical center if symptoms don't improve, worsen or new problems develop. The patient verbalized understanding.   Follow-up: Return if symptoms worsen or fail to improve.   Gildardo Pounds, FNP-BC [facility] [place], [place]   12/27/2017, 3:28 PM

## 2017-12-27 NOTE — Addendum Note (Signed)
Addended byVidal Schwalbe: Shandricka Monroy on: 12/27/2017 03:35 PM   Modules accepted: Orders

## 2017-12-30 NOTE — Progress Notes (Addendum)
Subjective: [name] is seen for follow up of painful left 2nd digit. She relates she had her son massage it and that feels good. She has also been applying Icy Hot to it and that helps. She voices she still is not interested in a joint injection of the digit.  She would also like evaluation of her right ankle as she feels it gives way at times.  Objective:  Vascular Examination: Capillary refill time <3 seconds x 10 digits Dorsalis pedis and posterior tibial pulses present b/l No digital hair x 10 digits Skin temperature warm to warm b/l  Dermatological Examination: Skin with normal turgor, texture and tone Toenails 1-5 b/l thick, dystrophic, discolored with subungual debris. Bilateral great toes incurvated at medial/lateral nail borders with +nail border hypertrophy. No erythema, no edema, no drainage.  Musculoskeletal: Muscle strength 5/5 to all LE muscle groups Minimal pain on palpation left 2nd digit MPJ dorsally and plantarly. No pain with passive ROM. No significant joint swelling, erythema, or warmth.  Right ankle with no pain on passive/active ROM. No edema, no erythema. No joint pain on palpation. Ligaments appear to be intact with no laxity.  Neurological: Sensation intact with 10 gram monofilament. Vibratory sensation intact.   Ref Range & Units 10d ago  Uric Acid, Serum 2.5 - 7.0 mg/dL 3.2   Comment: Therapeutic target for gout patients: <6.0 mg/dL     Assessment: 1. Painful metatarsophalangeal joint capsulitis left 2nd digit-nearly resolved. 2. Ankle pain right  Xrays negative for fracture, foreign body or bony pathology.   Plan: 1. We again discussed treatment options available for capsulitis:       A. Localized steroid injection       B. Padding of painful joint. She again declines injection of left 2nd MPJ due to her fear of needles.  2. Patient to report any pedal injuries to medical professional immediately. 3.  Reviewed Uric Acid results  today. 4. For right ankle, dispensed ankle compression sleeve. 5.  Follow up as needed. 6.  Patient to call should there be a concern in the interim.

## 2018-01-02 ENCOUNTER — Ambulatory Visit (HOSPITAL_COMMUNITY)
Admission: RE | Admit: 2018-01-02 | Discharge: 2018-01-02 | Disposition: A | Discharge status: Home / Self Care | Payer: Medicaid Other | Source: Ambulatory Visit | Attending: Nurse Practitioner | Admitting: Nurse Practitioner

## 2018-01-02 DIAGNOSIS — G8929 Other chronic pain: Secondary | ICD-10-CM

## 2018-01-02 DIAGNOSIS — M25561 Pain in right knee: Secondary | ICD-10-CM | POA: Insufficient documentation

## 2018-01-05 ENCOUNTER — Encounter: Payer: Self-pay | Admitting: Nurse Practitioner

## 2018-01-09 ENCOUNTER — Ambulatory Visit: Payer: Medicaid Other | Attending: Nurse Practitioner | Admitting: Physician Assistant

## 2018-01-09 VITALS — BP 139/97 | HR 114 | Temp 98.7°F | Ht 61.0 in | Wt 212.6 lb

## 2018-01-09 DIAGNOSIS — M5442 Lumbago with sciatica, left side: Secondary | ICD-10-CM | POA: Diagnosis not present

## 2018-01-09 DIAGNOSIS — H6690 Otitis media, unspecified, unspecified ear: Secondary | ICD-10-CM | POA: Insufficient documentation

## 2018-01-09 DIAGNOSIS — M79605 Pain in left leg: Secondary | ICD-10-CM

## 2018-01-09 DIAGNOSIS — Z79899 Other long term (current) drug therapy: Secondary | ICD-10-CM | POA: Diagnosis not present

## 2018-01-09 DIAGNOSIS — R8281 Pyuria: Secondary | ICD-10-CM

## 2018-01-09 DIAGNOSIS — Z794 Long term (current) use of insulin: Secondary | ICD-10-CM | POA: Diagnosis not present

## 2018-01-09 DIAGNOSIS — E1140 Type 2 diabetes mellitus with diabetic neuropathy, unspecified: Secondary | ICD-10-CM | POA: Diagnosis not present

## 2018-01-09 DIAGNOSIS — J45909 Unspecified asthma, uncomplicated: Secondary | ICD-10-CM | POA: Diagnosis not present

## 2018-01-09 DIAGNOSIS — Z885 Allergy status to narcotic agent status: Secondary | ICD-10-CM | POA: Insufficient documentation

## 2018-01-09 DIAGNOSIS — N390 Urinary tract infection, site not specified: Secondary | ICD-10-CM

## 2018-01-09 DIAGNOSIS — K219 Gastro-esophageal reflux disease without esophagitis: Secondary | ICD-10-CM | POA: Diagnosis not present

## 2018-01-09 DIAGNOSIS — I10 Essential (primary) hypertension: Secondary | ICD-10-CM | POA: Insufficient documentation

## 2018-01-09 LAB — POCT URINALYSIS DIP (CLINITEK)
Bilirubin, UA: NEGATIVE
Ketones, POC UA: NEGATIVE mg/dL
Nitrite, UA: NEGATIVE
RBC UA: NEGATIVE
SPEC GRAV UA: 1.01 (ref 1.010–1.025)
Urobilinogen, UA: 0.2 E.U./dL

## 2018-01-09 LAB — GLUCOSE, POCT (MANUAL RESULT ENTRY)
POC GLUCOSE: 380 mg/dL — AB (ref 70–99)
POC GLUCOSE: 367 mg/dL — AB (ref 70–99)

## 2018-01-09 MED ORDER — AMOXICILLIN 500 MG PO CAPS: 1000.0000 mg | ORAL_CAPSULE | Freq: Two times a day (BID) | ORAL | 0 refills | Status: DC

## 2018-01-09 MED ORDER — CYCLOBENZAPRINE HCL 10 MG PO TABS: ORAL_TABLET | ORAL | 0 refills | Status: DC

## 2018-01-09 MED ORDER — LISINOPRIL 20 MG PO TABS: 20.0000 mg | ORAL_TABLET | Freq: Every day | ORAL | 3 refills | Status: DC

## 2018-01-09 MED ORDER — FLUCONAZOLE 150 MG PO TABS: 150.0000 mg | ORAL_TABLET | Freq: Once | ORAL | 0 refills | Status: AC

## 2018-01-09 MED ORDER — NAPROXEN 500 MG PO TABS: 500.0000 mg | ORAL_TABLET | Freq: Two times a day (BID) | ORAL | 0 refills | Status: DC

## 2018-01-09 MED ORDER — INSULIN ASPART 100 UNIT/ML ~~LOC~~ SOLN
25.0000 [IU] | Freq: Once | SUBCUTANEOUS | Status: AC
  Administered 2018-01-09: 25 [IU] via SUBCUTANEOUS

## 2018-01-09 MED FILL — LISINOPRIL 20 MG TAB: 20 | 30 days supply | Qty: 30 | Fill #0

## 2018-01-09 MED FILL — NAPROXEN 500 MG TABLET: 500 | 30 days supply | Qty: 60 | Fill #0

## 2018-01-09 MED FILL — FLUCONAZOLE 150 MG TABS: 150 | 1 days supply | Qty: 1 | Fill #0

## 2018-01-09 MED FILL — AMOXICILLIN 500 MG CAPSULE: 500 | 10 days supply | Qty: 40 | Fill #0

## 2018-01-09 MED FILL — CYCLOBENZAPRINE 10 MG TAB: 10 | 10 days supply | Qty: 30 | Fill #0

## 2018-01-09 NOTE — Progress Notes (Signed)
Patient ID: Sarah Garrison, female   DOB: 04/14/1981, 37 y.o.   MRN: [id number]   Sarah Garrison, is a 37 y.o. female  XBD:[id number]  STM:[id number]  DOB - 03/12/1981  Subjective:  Chief Complaint and HPI: Sarah Garrison is a 37 y.o. female here today with multiple complaints.  Patient is overall no-compliant with medications.  Doesn't check sugars.  Here today with 3 day h/o LBP and pain in L leg.  NKI.  No new paresthesias.  Methocarbamol and ibuprofen didn't help.  She tried her aunt's flexeril which did help.  She denies UTI s/sx.    C/o several day h/o R ear pain.  No f/c.    ROS:   Constitutional:  No f/c, No night sweats, No unexplained weight loss. EENT:  No vision changes, No blurry vision, No hearing changes. No  Additional mouth, throat, or ear problems.  Respiratory: No cough, No SOB Cardiac: No CP, no palpitations GI:  No abd pain, No N/V/D. GU: No Urinary s/sx Musculoskeletal: +LBP Neuro: No headache, no dizziness, no motor weakness.  Skin: No rash Endocrine:  No polydipsia. No polyuria.  Psych: Denies SI/HI  No problems updated.  ALLERGIES: Allergies  Allergen Reactions  . Morphine And Related Shortness Of Breath  . Glyburide Diarrhea and Other (See Comments)    Reaction:  Nose bleeds   . Ivp Dye [Iodinated Diagnostic Agents]     Shortness of breath.    . Oxycodone Other (See Comments)    Pt states that this medication makes her "dream about rabbits chasing" her.    . Vicodin [Hydrocodone-Acetaminophen] Other (See Comments)    Pt states that this medication makes her "dream about rabbits chasing" her.      PAST MEDICAL HISTORY: Past Medical History:  Diagnosis Date  . Asthma   . Diabetes mellitus without complication ([place])   . GERD (gastroesophageal reflux disease)   . Hypertension     MEDICATIONS AT HOME: Prior to Admission medications   Medication Sig Start Date End Date Taking? Authorizing Provider  albuterol (PROVENTIL HFA;VENTOLIN HFA)  108 (90 Base) MCG/ACT inhaler Inhale 1-2 puffs into the lungs every 6 (six) hours as needed for wheezing or shortness of breath. 11/20/17  Yes Gildardo Pounds, NP  atorvastatin (LIPITOR) 20 MG tablet Take 1 tablet (20 mg total) by mouth daily. 11/23/17  Yes Gildardo Pounds, NP  Blood Glucose Monitoring Suppl (ACCU-CHEK AVIVA) device Use as instructed 3 times daily 10/17/17  Yes Newlin, Charlane Ferretti, MD  Blood Glucose Monitoring Suppl (TRUE METRIX METER) w/Device KIT Use as directed 06/25/17  Yes Jegede, Olugbemiga E, MD  gabapentin (NEURONTIN) 300 MG capsule Take 2 capsules (600 mg total) by mouth 3 (three) times daily. 10/16/17  Yes Freeman Caldron M, PA-C  glucose blood (TRUE METRIX BLOOD GLUCOSE TEST) test strip Use as instructed 03/04/17  Yes Jegede, Olugbemiga E, MD  insulin aspart (NOVOLOG) 100 UNIT/ML FlexPen Inject 15 Units into the skin 3 (three) times daily with meals. Take with largest meal. 11/20/17  Yes Gildardo Pounds, NP  Insulin Pen Needle (ULTICARE PEN NEEDLES) 29G X 12.7MM MISC Use as instructed 11/20/17  Yes Gildardo Pounds, NP  LANTUS SOLOSTAR 100 UNIT/ML Solostar Pen 34 units twice daily 11/20/17  Yes Gildardo Pounds, NP  loperamide (IMODIUM A-D) 2 MG tablet Take 1 tablet (2 mg total) by mouth 4 (four) times daily as needed for diarrhea or loose stools. 05/29/17  Yes Hairston, Maylon Peppers, FNP  metoCLOPramide (REGLAN) 10 MG  tablet Take 1 tablet (10 mg total) by mouth every 8 (eight) hours as needed for nausea. 10/16/17  Yes Freeman Caldron M, PA-C  nystatin (NYSTATIN) powder Apply topically 3 (three) times daily. 09/23/17  Yes Charlott Rakes, MD  omeprazole (PRILOSEC) 20 MG capsule Take 1 capsule (20 mg total) by mouth daily. 07/24/17  Yes Tresa Garter, MD  ondansetron (ZOFRAN) 8 MG tablet Take 1 tablet (8 mg total) by mouth every 8 (eight) hours as needed for nausea or vomiting. 06/27/17  Yes Selenia Mihok, Dionne Bucy, PA-C  traZODone (DESYREL) 100 MG tablet Take 1 tablet (100 mg total) by  mouth at bedtime. 12/27/17  Yes Gildardo Pounds, NP  TRUEPLUS LANCETS 28G MISC Use as directed 03/04/17  Yes Tresa Garter, MD  amoxicillin (AMOXIL) 500 MG capsule Take 2 capsules (1,000 mg total) by mouth 2 (two) times daily. 01/09/18   Argentina Donovan, PA-C  cyclobenzaprine (FLEXERIL) 10 MG tablet 1/2-1 up to 3 times daily prn muscle spasm 01/09/18   Freeman Caldron M, PA-C  fluconazole (DIFLUCAN) 150 MG tablet Take 1 tablet (150 mg total) by mouth once for 1 dose. 01/09/18 01/09/18  Argentina Donovan, PA-C  lisinopril (PRINIVIL,ZESTRIL) 20 MG tablet Take 1 tablet (20 mg total) by mouth daily. 01/09/18   Argentina Donovan, PA-C  naproxen (NAPROSYN) 500 MG tablet Take 1 tablet (500 mg total) by mouth 2 (two) times daily with a meal. X 7 days then prn pain 01/09/18   Freeman Caldron M, PA-C  Olopatadine HCl 0.2 % SOLN Apply 1 drop to each eye daily Patient not taking: Reported on 01/09/2018 11/20/17   Gildardo Pounds, NP     Objective:  EXAM:   Vitals:   01/09/18 1350  BP: (!) 139/97  Pulse: (!) 114  Temp: 98.7 F (37.1 C)  TempSrc: Oral  SpO2: 96%  Weight: 212 lb 9.6 oz (96.4 kg)  Height: 5' 1"  (1.549 m)    General appearance : A&OX3. NAD. Non-toxic-appearing HEENT: Atraumatic and Normocephalic.  PERRLA. EOM intact. R TM with bulging and erythema.  L TM WNL.  Mouth-MMM, post pharynx WNL w/o erythema, No PND. Neck: supple, no JVD. No cervical lymphadenopathy. No thyromegaly Chest/Lungs:  Breathing-non-labored, Good air entry bilaterally, breath sounds normal without rales, rhonchi, or wheezing  CVS: S1 S2 regular, no murmurs, gallops, rubs  Back: full S&ROM with no bony TTP.  Neg SLR B.  There is paraspinus spasm in the lumbar region B.  DTR LE=B.   Extremities: Bilateral Lower Ext shows no edema, both legs are warm to touch with = pulse throughout.  Negative homan's B, no swelling or erythema of either calf.  No abnormality of L leg.   Neurology:  CN II-XII grossly intact, Non focal.     Psych:  TP linear. J/I WNL. Normal speech. Appropriate eye contact and affect.  Skin:  No Rash  Data Review Lab Results  Component Value Date   HGBA1C 11.8 (A) 11/20/2017   HGBA1C 11.0 08/14/2017   HGBA1C 11.9 05/29/2017     Assessment & Plan   1. Acute bilateral low back pain with left-sided sciatica No red flags - cyclobenzaprine (FLEXERIL) 10 MG tablet; 1/2-1 up to 3 times daily prn muscle spasm  Dispense: 30 tablet; Refill: 0 - naproxen (NAPROSYN) 500 MG tablet; Take 1 tablet (500 mg total) by mouth 2 (two) times daily with a meal. X 7 days then prn pain  Dispense: 60 tablet; Refill: 0 - POCT URINALYSIS DIP (  CLINITEK)  2. Type 2 diabetes mellitus with diabetic neuropathy, unspecified whether long term insulin use ([place]) Uncontrolled/non-compliant.  I have had multiple discussions with her at length about medication compliance, diet, exercise and the risks/morbidities/mortalities associated with uncontrolled DM.  Spent >30 mins face to face.   - Glucose (CBG) - insulin aspart (novoLOG) injection 25 Units-blood sugar from 380 down to 367 - Glucose (CBG) - POCT URINALYSIS DIP (CLINITEK) Adhere to current medication regimen  3. Essential hypertension Uncontrolled.  Increase dose- - lisinopril (PRINIVIL,ZESTRIL) 20 MG tablet; Take 1 tablet (20 mg total) by mouth daily.  Dispense: 90 tablet; Refill: 3  4. Left leg pain - naproxen (NAPROSYN) 500 MG tablet; Take 1 tablet (500 mg total) by mouth 2 (two) times daily with a meal. X 7 days then prn pain  Dispense: 60 tablet; Refill: 0  5. Acute otitis media, unspecified otitis media type - amoxicillin (AMOXIL) 500 MG capsule; Take 2 capsules (1,000 mg total) by mouth 2 (two) times daily.  Dispense: 40 capsule; Refill: 0 - fluconazole (DIFLUCAN) 150 MG tablet; Take 1 tablet (150 mg total) by mouth once for 1 dose.  Dispense: 1 tablet; Refill: 0  6. Leukocytes in urine-will send for culture and already putting her on amoxicillin for  AOM.     Patient have been counseled extensively about nutrition and exercise  Return for keep 02/21/2018 appt with Geryl Rankins.  The patient was given clear instructions to go to ER or return to medical center if symptoms don't improve, worsen or new problems develop. The patient verbalized understanding. The patient was told to call to get lab results if they haven't heard anything in the next week.     Freeman Caldron, PA-C [facility] [place], [place]   01/09/2018, 2:17 PM

## 2018-01-11 LAB — URINE CULTURE

## 2018-01-18 ENCOUNTER — Encounter: Payer: Self-pay | Admitting: Nurse Practitioner

## 2018-02-09 ENCOUNTER — Encounter (HOSPITAL_COMMUNITY): Payer: Self-pay | Admitting: Emergency Medicine

## 2018-02-09 ENCOUNTER — Ambulatory Visit (HOSPITAL_COMMUNITY)
Admission: EM | Admit: 2018-02-09 | Discharge: 2018-02-09 | Disposition: A | Discharge status: Home / Self Care | Payer: Medicaid Other | Attending: Internal Medicine | Admitting: Internal Medicine

## 2018-02-09 DIAGNOSIS — N760 Acute vaginitis: Secondary | ICD-10-CM | POA: Insufficient documentation

## 2018-02-09 DIAGNOSIS — Z79899 Other long term (current) drug therapy: Secondary | ICD-10-CM | POA: Diagnosis not present

## 2018-02-09 DIAGNOSIS — Z885 Allergy status to narcotic agent status: Secondary | ICD-10-CM | POA: Diagnosis not present

## 2018-02-09 DIAGNOSIS — E1142 Type 2 diabetes mellitus with diabetic polyneuropathy: Secondary | ICD-10-CM | POA: Insufficient documentation

## 2018-02-09 DIAGNOSIS — I10 Essential (primary) hypertension: Secondary | ICD-10-CM | POA: Insufficient documentation

## 2018-02-09 DIAGNOSIS — Z794 Long term (current) use of insulin: Secondary | ICD-10-CM | POA: Diagnosis not present

## 2018-02-09 DIAGNOSIS — M7989 Other specified soft tissue disorders: Secondary | ICD-10-CM | POA: Diagnosis not present

## 2018-02-09 DIAGNOSIS — J45909 Unspecified asthma, uncomplicated: Secondary | ICD-10-CM | POA: Diagnosis not present

## 2018-02-09 DIAGNOSIS — F17210 Nicotine dependence, cigarettes, uncomplicated: Secondary | ICD-10-CM | POA: Diagnosis not present

## 2018-02-09 DIAGNOSIS — K219 Gastro-esophageal reflux disease without esophagitis: Secondary | ICD-10-CM | POA: Diagnosis not present

## 2018-02-09 DIAGNOSIS — E1165 Type 2 diabetes mellitus with hyperglycemia: Secondary | ICD-10-CM | POA: Insufficient documentation

## 2018-02-09 LAB — POCT URINALYSIS DIP (DEVICE)
Bilirubin Urine: NEGATIVE
Ketones, ur: NEGATIVE mg/dL
LEUKOCYTES UA: NEGATIVE
NITRITE: NEGATIVE
PROTEIN: NEGATIVE mg/dL
SPECIFIC GRAVITY, URINE: 1.01 (ref 1.005–1.030)
UROBILINOGEN UA: 0.2 mg/dL (ref 0.0–1.0)
pH: 5.5 (ref 5.0–8.0)

## 2018-02-09 MED ORDER — FLUCONAZOLE 150 MG PO TABS: 150.0000 mg | ORAL_TABLET | Freq: Once | ORAL | 0 refills | Status: AC

## 2018-02-09 NOTE — ED Provider Notes (Signed)
[facility]    CSN: [id number] Arrival date & time: 02/09/18  1030     History   Chief Complaint Chief Complaint  Patient presents with  . Vaginitis    HPI SHEALEIGH DUNSTAN is a 37 y.o. female history of asthma, DM type II, GERD, hypertension presenting today for evaluation of yeast infection.  Patient states that she recently was taking antibiotics for an ear infection, and since has developed some slight discharge itching and discomfort with urination.  She is also concerned about a UTI.  States that she frequently gets yeast infections after taking antibiotics.  Denies any fever, nausea, vomiting, abdominal pain or back pain.  Does feel like she has had an increase in frequency of her urination as well.  HPI  Past Medical History:  Diagnosis Date  . Asthma   . Diabetes mellitus without complication ([place])   . GERD (gastroesophageal reflux disease)   . Hypertension     Patient Active Problem List   Diagnosis Date Noted  . Uncontrolled type 2 diabetes mellitus with complication, with long-term current use of insulin ([place]) 12/13/2016  . Non-adherence to medical treatment 12/13/2016  . Asthma 07/30/2016  . 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/07/2016  . Encounter for pregnancy test 02/01/2016  . Leg swelling 09/29/2015  . Morbid obesity, unspecified obesity type ([place]) 09/29/2015  . DM neuropathy, type II diabetes mellitus ([place]) 08/24/2015    Past Surgical History:  Procedure Laterality Date  . CESAREAN SECTION    . TUBAL LIGATION    . VENTRAL HERNIA REPAIR N/A 11/27/2017   Procedure: [place];  Surgeon: Erroll Luna, MD;  Location: [facility];  Service: General;  Laterality: N/A;  . WISDOM TOOTH EXTRACTION      OB History    Gravida  5   Para  4   Term      Preterm      AB  1   Living  4     SAB  1   TAB      Ectopic      Multiple      Live Births               Home Medications    Prior to  Admission medications   Medication Sig Start Date End Date Taking? Authorizing Provider  albuterol (PROVENTIL HFA;VENTOLIN HFA) 108 (90 Base) MCG/ACT inhaler Inhale 1-2 puffs into the lungs every 6 (six) hours as needed for wheezing or shortness of breath. 11/20/17   Gildardo Pounds, NP  atorvastatin (LIPITOR) 20 MG tablet Take 1 tablet (20 mg total) by mouth daily. 11/23/17   Gildardo Pounds, NP  Blood Glucose Monitoring Suppl (ACCU-CHEK AVIVA) device Use as instructed 3 times daily 10/17/17   Charlott Rakes, MD  Blood Glucose Monitoring Suppl (TRUE METRIX METER) w/Device KIT Use as directed 06/25/17   Tresa Garter, MD  cyclobenzaprine (FLEXERIL) 10 MG tablet 1/2-1 up to 3 times daily prn muscle spasm 01/09/18   Argentina Donovan, PA-C  fluconazole (DIFLUCAN) 150 MG tablet Take 1 tablet (150 mg total) by mouth once for 1 dose. 02/09/18 02/09/18  Wieters, Hallie C, PA-C  gabapentin (NEURONTIN) 300 MG capsule Take 2 capsules (600 mg total) by mouth 3 (three) times daily. 10/16/17   Argentina Donovan, PA-C  glucose blood (TRUE METRIX BLOOD GLUCOSE TEST) test strip Use as instructed 03/04/17   Angelica Chessman E, MD  insulin aspart (NOVOLOG) 100 UNIT/ML FlexPen Inject 15  Units into the skin 3 (three) times daily with meals. Take with largest meal. 11/20/17   Gildardo Pounds, NP  Insulin Pen Needle (ULTICARE PEN NEEDLES) 29G X 12.7MM MISC Use as instructed 11/20/17   Gildardo Pounds, NP  LANTUS SOLOSTAR 100 UNIT/ML Solostar Pen 34 units twice daily 11/20/17   Gildardo Pounds, NP  lisinopril (PRINIVIL,ZESTRIL) 20 MG tablet Take 1 tablet (20 mg total) by mouth daily. 01/09/18   Argentina Donovan, PA-C  loperamide (IMODIUM A-D) 2 MG tablet Take 1 tablet (2 mg total) by mouth 4 (four) times daily as needed for diarrhea or loose stools. 05/29/17   Alfonse Spruce, FNP  metoCLOPramide (REGLAN) 10 MG tablet Take 1 tablet (10 mg total) by mouth every 8 (eight) hours as needed for nausea. 10/16/17   Argentina Donovan, PA-C  naproxen (NAPROSYN) 500 MG tablet Take 1 tablet (500 mg total) by mouth 2 (two) times daily with a meal. X 7 days then prn pain 01/09/18   Freeman Caldron M, PA-C  nystatin (NYSTATIN) powder Apply topically 3 (three) times daily. 09/23/17   Charlott Rakes, MD  Olopatadine HCl 0.2 % SOLN Apply 1 drop to each eye daily Patient not taking: Reported on 01/09/2018 11/20/17   Gildardo Pounds, NP  omeprazole (PRILOSEC) 20 MG capsule Take 1 capsule (20 mg total) by mouth daily. 07/24/17   Tresa Garter, MD  ondansetron (ZOFRAN) 8 MG tablet Take 1 tablet (8 mg total) by mouth every 8 (eight) hours as needed for nausea or vomiting. 06/27/17   Argentina Donovan, PA-C  traZODone (DESYREL) 100 MG tablet Take 1 tablet (100 mg total) by mouth at bedtime. 12/27/17   Gildardo Pounds, NP  TRUEPLUS LANCETS 28G MISC Use as directed 03/04/17   Tresa Garter, MD    Family History Family History  Problem Relation Age of Onset  . Asthma Mother   . Cancer Mother        brain cancer  . Kidney failure Mother   . Asthma Father     Social History Social History   Tobacco Use  . Smoking status: Current Every Day Smoker    Packs/day: 0.10    Types: Cigarettes    Last attempt to quit: 11/02/2013    Years since quitting: 4.2  . Smokeless tobacco: Never Used  Substance Use Topics  . Alcohol use: No  . Drug use: No     Allergies   Morphine and related; Glyburide; Ivp dye [iodinated diagnostic agents]; Oxycodone; and Vicodin [hydrocodone-acetaminophen]   Review of Systems Review of Systems  Constitutional: Negative for fever.  Respiratory: Negative for shortness of breath.   Cardiovascular: Negative for chest pain.  Gastrointestinal: Negative for abdominal pain, diarrhea, nausea and vomiting.  Genitourinary: Positive for dysuria and vaginal discharge. Negative for flank pain, genital sores, hematuria, menstrual problem, vaginal bleeding and vaginal pain.  Musculoskeletal: Negative  for back pain.  Skin: Negative for rash.  Neurological: Negative for dizziness, light-headedness and headaches.     Physical Exam Triage Vital Signs ED Triage Vitals [02/09/18 1224]  Enc Vitals Group     BP (!) 147/101     Pulse Rate 100     Resp 16     Temp 97.9 F (36.6 C)     Temp Source Oral     SpO2 99 %     Weight      Height      Head Circumference  Peak Flow      Pain Score      Pain Loc      Pain Edu?      Excl. in [place]?    No data found.  Updated Vital Signs BP (!) 147/101   Pulse 100   Temp 97.9 F (36.6 C) (Oral)   Resp 16   SpO2 99%   Visual Acuity Right Eye Distance:   Left Eye Distance:   Bilateral Distance:    Right Eye Near:   Left Eye Near:    Bilateral Near:     Physical Exam  Constitutional: She is oriented to person, place, and time. She appears well-developed and well-nourished.  No acute distress  HENT:  Head: Normocephalic and atraumatic.  Nose: Nose normal.  Eyes: Conjunctivae are normal.  Neck: Neck supple.  Cardiovascular: Normal rate.  Pulmonary/Chest: Effort normal. No respiratory distress.  Abdominal: She exhibits no distension.  Musculoskeletal: Normal range of motion.  Neurological: She is alert and oriented to person, place, and time.  Skin: Skin is warm and dry.  Psychiatric: She has a normal mood and affect.  Nursing note and vitals reviewed.    UC Treatments / Results  Labs (all labs ordered are listed, but only abnormal results are displayed) Labs Reviewed  POCT URINALYSIS DIP (DEVICE) - Abnormal; Notable for the following components:      Result Value   Glucose, UA >=1000 (*)    Hgb urine dipstick TRACE (*)    All other components within normal limits  CERVICOVAGINAL ANCILLARY ONLY    EKG None  Radiology No results found.  Procedures Procedures (including critical care time)  Medications Ordered in UC Medications - No data to display  Initial Impression / Assessment and Plan / UC Course  I  have reviewed the triage vital signs and the nursing notes.  Pertinent labs & imaging results that were available during my care of the patient were reviewed by me and considered in my medical decision making (see chart for details).     We will go ahead and empirically treat for yeast infection given recent antibiotic use, patient with negative leuks and nitrites, patient does have greater than thousand glucose in urine, likely from uncontrolled diabetes likely causing her urinary frequency.  Will see if burning with urination improves with treatment of yeast.  Vaginal swab obtained to confirm and to check for other causes.  Will call patient with results and alter treatment as needed.Discussed strict return precautions. Patient verbalized understanding and is agreeable with plan.  Final Clinical Impressions(s) / UC Diagnoses   Final diagnoses:  Vaginitis and vulvovaginitis     Discharge Instructions     Please take 1 tablet of Diflucan today, may repeat in a couple of days if symptoms still persisting.  This is likely the cause of the discomfort with urination as well.  There is no signs of UTI on your urine.  We will call you with the results of the swab and change treatment if needed.  Please follow-up if symptoms not improving with treatment, changing, worsening, developing abdominal pain, fever, nausea, vomiting, pelvic pain.   ED Prescriptions    Medication Sig Dispense Auth. Provider   fluconazole (DIFLUCAN) 150 MG tablet Take 1 tablet (150 mg total) by mouth once for 1 dose. 2 tablet Wieters, Hallie C, PA-C     Controlled Substance Prescriptions [place] Controlled Substance Registry consulted? Not Applicable   Janith Lima, [place] 02/09/18 1321

## 2018-02-09 NOTE — ED Triage Notes (Signed)
Pt states she thinks she has a UTI or a yeast infection, recently took antibiotics for ear infection, states she thinks it gave her yeast. States after she pees theres a burning sensation.

## 2018-02-09 NOTE — Discharge Instructions (Signed)
Please take 1 tablet of Diflucan today, may repeat in a couple of days if symptoms still persisting.  This is likely the cause of the discomfort with urination as well.  There is no signs of UTI on your urine.  We will call you with the results of the swab and change treatment if needed.  Please follow-up if symptoms not improving with treatment, changing, worsening, developing abdominal pain, fever, nausea, vomiting, pelvic pain.

## 2018-02-09 NOTE — Progress Notes (Signed)
Patient ID: Sarah Garrison, female   DOB: 24-Sep-1980, 37 y.o.   MRN: [id number]          Reason for Appointment: Consultation for Type 2 Diabetes  Referring physician: Dr. Raul Del   History of Present Illness:          Date of diagnosis of type 2 diabetes mellitus:  2017      Background history:  Her diabetes was diagnosed incidentally when she was being evaluated for an episode of syncope Initial A1c was 10.1 She was initially started on metformin but because of GI intolerance she was switched to insulin and has been continued on this since then Also has been tried on glyburide and Amaryl without improvement in blood sugars In 2/19 she was given Trulicity 1.5 mg weekly but she thinks it caused constipation and hair loss and she did not continue this after 3 injections, she thinks it did not help her sugar Her A1c has been consistently over 9% and recently higher  Recent history:   Most recent A1c is 13%, previously 11.8  INSULIN regimen is:  Lantus twice a day, 30--34 Novolog 15 units tid with meals     Non-insulin hypoglycemic drugs the patient is taking are: None  Current management, blood sugar patterns and problems identified:  She is still starting to use an Accu-Chek meter today to check her sugar and does not have any records of her blood sugar  Because of sensitivity to her fingertips to finger pricking she checks her blood sugars only sporadically  Most of her readings at home are in the 200 and also 300 range and she does not think they are any higher at any given time  She stays thirsty but she is drinking mostly water  Because of back and leg pains she is not able to do any exercise  Blood sugar in the office today is 412  She thinks she is taking her insulin doses consistently although she is complaining about number of injections she is needing to take and is asking to be switched to an insulin pump          Side effects from medications have been:  Nausea, diarrhea from metformin.  Constipation and hair loss from Trulicity  Compliance with the medical regimen: Fair     Diet: She is usually consistently trying to avoid any fried food or high fat foods, tries to have some vegetables with her meals       Exercise:  None recently  Glucose monitoring:  done <1 times a day         Glucometer:   True Metrix/Accu-Chek     Blood Glucose readings not available   Dietician visit, most recent: None  Weight history:  Wt Readings from Last 3 Encounters:  02/10/18 217 lb (98.4 kg)  01/09/18 212 lb 9.6 oz (96.4 kg)  12/27/17 213 lb (96.6 kg)    Glycemic control:   Lab Results  Component Value Date   HGBA1C 13.0 (A) 02/10/2018   HGBA1C 11.8 (A) 11/20/2017   HGBA1C 11.0 08/14/2017   Lab Results  Component Value Date   MICROALBUR 0.9 10/13/2015   CREATININE 0.63 11/21/2017   Lab Results  Component Value Date   MICRALBCREAT 4 10/13/2015    No results found for: FRUCTOSAMINE  Office Visit on 02/10/2018  Component Date Value Ref Range Status  . Hemoglobin A1C 02/10/2018 13.0* 4.0 - 5.6 % Final  . POC Glucose 02/10/2018 412* 70 - 99 mg/dl  Final  Clinical Support on 02/10/2018  Component Date Value Ref Range Status  . Preg Test, Ur 02/10/2018 Negative  Negative Final  Admission on 02/09/2018, Discharged on 02/09/2018  Component Date Value Ref Range Status  . Glucose, UA 02/09/2018 >=1000* NEGATIVE mg/dL Final  . Bilirubin Urine 02/09/2018 NEGATIVE  NEGATIVE Final  . Ketones, ur 02/09/2018 NEGATIVE  NEGATIVE mg/dL Final  . Specific Gravity, Urine 02/09/2018 1.010  1.005 - 1.030 Final  . Hgb urine dipstick 02/09/2018 TRACE* NEGATIVE Final  . pH 02/09/2018 5.5  5.0 - 8.0 Final  . Protein, ur 02/09/2018 NEGATIVE  NEGATIVE mg/dL Final  . Urobilinogen, UA 02/09/2018 0.2  0.0 - 1.0 mg/dL Final  . Nitrite 02/09/2018 NEGATIVE  NEGATIVE Final  . Leukocytes, UA 02/09/2018 NEGATIVE  NEGATIVE Final   Biochemical Testing Only. Please  order routine urinalysis from main lab if confirmatory testing is needed.    Allergies as of 02/10/2018      Reactions   Morphine And Related Shortness Of Breath   Glyburide Diarrhea, Other (See Comments)   Reaction:  Nose bleeds    Ivp Dye [iodinated Diagnostic Agents]    Shortness of breath.     Oxycodone Other (See Comments)   Pt states that this medication makes her "dream about rabbits chasing" her.     Vicodin [hydrocodone-acetaminophen] Other (See Comments)   Pt states that this medication makes her "dream about rabbits chasing" her.        Medication List        Accurate as of 02/10/18  3:42 PM. Always use your most recent med list.          albuterol 108 (90 Base) MCG/ACT inhaler Commonly known as:  PROVENTIL HFA;VENTOLIN HFA Inhale 1-2 puffs into the lungs every 6 (six) hours as needed for wheezing or shortness of breath.   atorvastatin 20 MG tablet Commonly known as:  LIPITOR Take 1 tablet (20 mg total) by mouth daily.   cyclobenzaprine 10 MG tablet Commonly known as:  FLEXERIL 1/2-1 up to 3 times daily prn muscle spasm   gabapentin 300 MG capsule Commonly known as:  NEURONTIN Take 2 capsules (600 mg total) by mouth 3 (three) times daily.   glucose blood test strip Use as instructed   insulin aspart 100 UNIT/ML FlexPen Commonly known as:  NOVOLOG Inject 15 Units into the skin 3 (three) times daily with meals. Take with largest meal.   Insulin Pen Needle 29G X 12.7MM Misc Use as instructed   insulin regular human CONCENTRATED 500 UNIT/ML kwikpen Commonly known as:  HUMULIN R 50 Units as measured on the pen, 30 minutes before each meal   LANTUS SOLOSTAR 100 UNIT/ML Solostar Pen Generic drug:  Insulin Glargine 34 units twice daily   lisinopril 20 MG tablet Commonly known as:  PRINIVIL,ZESTRIL Take 1 tablet (20 mg total) by mouth daily.   loperamide 2 MG tablet Commonly known as:  IMODIUM A-D Take 1 tablet (2 mg total) by mouth 4 (four) times daily  as needed for diarrhea or loose stools.   metoCLOPramide 10 MG tablet Commonly known as:  REGLAN Take 1 tablet (10 mg total) by mouth every 8 (eight) hours as needed for nausea.   naproxen 500 MG tablet Commonly known as:  NAPROSYN Take 1 tablet (500 mg total) by mouth 2 (two) times daily with a meal. X 7 days then prn pain   nystatin powder Commonly known as:  MYCOSTATIN/NYSTOP Apply topically 3 (three) times daily.  Olopatadine HCl 0.2 % Soln Apply 1 drop to each eye daily   omeprazole 20 MG capsule Commonly known as:  PRILOSEC Take 1 capsule (20 mg total) by mouth daily.   ondansetron 8 MG tablet Commonly known as:  ZOFRAN Take 1 tablet (8 mg total) by mouth every 8 (eight) hours as needed for nausea or vomiting.   traZODone 100 MG tablet Commonly known as:  DESYREL Take 1 tablet (100 mg total) by mouth at bedtime.   TRUE METRIX METER w/Device Kit Use as directed   ACCU-CHEK AVIVA device Use as instructed 3 times daily   TRUEPLUS LANCETS 28G Misc Use as directed       Allergies:  Allergies  Allergen Reactions  . Morphine And Related Shortness Of Breath  . Glyburide Diarrhea and Other (See Comments)    Reaction:  Nose bleeds   . Ivp Dye [Iodinated Diagnostic Agents]     Shortness of breath.    . Oxycodone Other (See Comments)    Pt states that this medication makes her "dream about rabbits chasing" her.    . Vicodin [Hydrocodone-Acetaminophen] Other (See Comments)    Pt states that this medication makes her "dream about rabbits chasing" her.      Past Medical History:  Diagnosis Date  . Asthma   . Diabetes mellitus without complication ([place])   . GERD (gastroesophageal reflux disease)   . Hypertension     Past Surgical History:  Procedure Laterality Date  . CESAREAN SECTION    . TUBAL LIGATION    . VENTRAL HERNIA REPAIR N/A 11/27/2017   Procedure: [place];  Surgeon: Erroll Luna, MD;  Location: [facility];  Service: General;  Laterality: N/A;  . WISDOM TOOTH EXTRACTION      Family History  Problem Relation Age of Onset  . Asthma Mother   . Cancer Mother        brain cancer  . Kidney failure Mother   . Asthma Father   . Diabetes Brother   . Diabetes Maternal Grandmother     Social History:  reports that she quit smoking about 2 months ago. Her smoking use included cigarettes. She smoked 0.10 packs per day. She has never used smokeless tobacco. She reports that she does not drink alcohol or use drugs.   Review of Systems  Constitutional: Negative for weight loss and reduced appetite.  HENT: Negative for headaches.   Eyes:       At times will have blurred vision  Respiratory: Negative for shortness of breath.   Cardiovascular: Negative for palpitations and leg swelling.  Endocrine: Positive for fatigue.  Genitourinary:       Given Diflucan on 02/09/2018 for Candida vaginitis.  Prone to this especially with the antibiotic use  Musculoskeletal: Positive for joint pain.       Has knee and back problems and sciatica  Skin: Negative for rash.  Neurological: Positive for numbness.       She has some numbness and tingling in her left hand.  Also occasionally in the feet especially left  Psychiatric/Behavioral: Positive for insomnia.     Lipid history:    No results found for: CHOL, HDL, LDLCALC, LDLDIRECT, TRIG, CHOLHDL         Hypertension: Has been present since 2017, treated by PCP and currently on 20 mg lisinopril only  BP Readings from Last 3 Encounters:  02/10/18 (!) 133/98  02/09/18 (!) 147/101  01/09/18 (!) 139/97  Most recent eye exam was in 2018  Most recent foot exam: 02/2018  Currently known complications of diabetes: None  LABS:  Office Visit on 02/10/2018  Component Date Value Ref Range Status  . Hemoglobin A1C 02/10/2018 13.0* 4.0 - 5.6 % Final  . POC Glucose 02/10/2018 412* 70 - 99 mg/dl Final  Clinical Support on 02/10/2018  Component Date  Value Ref Range Status  . Preg Test, Ur 02/10/2018 Negative  Negative Final  Admission on 02/09/2018, Discharged on 02/09/2018  Component Date Value Ref Range Status  . Glucose, UA 02/09/2018 >=1000* NEGATIVE mg/dL Final  . Bilirubin Urine 02/09/2018 NEGATIVE  NEGATIVE Final  . Ketones, ur 02/09/2018 NEGATIVE  NEGATIVE mg/dL Final  . Specific Gravity, Urine 02/09/2018 1.010  1.005 - 1.030 Final  . Hgb urine dipstick 02/09/2018 TRACE* NEGATIVE Final  . pH 02/09/2018 5.5  5.0 - 8.0 Final  . Protein, ur 02/09/2018 NEGATIVE  NEGATIVE mg/dL Final  . Urobilinogen, UA 02/09/2018 0.2  0.0 - 1.0 mg/dL Final  . Nitrite 02/09/2018 NEGATIVE  NEGATIVE Final  . Leukocytes, UA 02/09/2018 NEGATIVE  NEGATIVE Final   Biochemical Testing Only. Please order routine urinalysis from main lab if confirmatory testing is needed.    Physical Examination:  BP (!) 133/98   Pulse 96   Ht 5' 4"  (1.626 m)   Wt 217 lb (98.4 kg)   SpO2 99%   BMI 37.25 kg/m   GENERAL:         Patient has marked generalized obesity.    HEENT:         Eye exam shows normal external appearance.  Fundus exam shows no retinopathy.  Oral exam shows normal mucosa .   NECK:   There is no lymphadenopathy  Thyroid is not palpable and no nodules felt.    LUNGS:         Chest is symmetrical. Lungs are clear to auscultation.Marland Kitchen   HEART:         Heart sounds:  S1 and S2 are normal. No murmur or click heard., no S3 or S4.   ABDOMEN:   There is no distention present. Liver and spleen are not palpable.  No other mass or tenderness present.    NEUROLOGICAL:   Ankle jerks are 1+ bilaterally.    Diabetic Foot Exam - Simple   Simple Foot Form Diabetic Foot exam was performed with the following findings:  Yes   Visual Inspection No deformities, no ulcerations, no other skin breakdown bilaterally:  Yes Sensation Testing Intact to touch and monofilament testing bilaterally:  Yes Pulse Check Posterior Tibialis and Dorsalis pulse intact  bilaterally:  Yes Comments            Vibration sense is minimally reduced in distal first toes.  MUSCULOSKELETAL:  There is no swelling or deformity of the peripheral joints.     EXTREMITIES:     There is no ankle edema.  SKIN:       No rash.  She has mild acanthosis of neck      ASSESSMENT:  Diabetes type 2 with significant obesity and persistently poor control See history of present illness for detailed discussion of current diabetes management, blood sugar patterns and problems identified  She has been on insulin alone and reportedly has had intolerance to metformin and Trulicity However intolerance to Trulicity was somewhat atypical and was tried on 1.5 mg without titration May not also be a candidate for an SGLT2 drug because of her recurrence of frequent  vaginal candidiasis Currently with basal bolus insulin regimen her A1c is still around 13% and blood sugars are mostly over 300 She has difficulty with glucose monitoring because of painful fingersticks and currently no blood sugar patterns discernible as she is not keeping a record of her blood sugars  She is also using relatively small amounts of mealtime insulin compared to her basal insulin dose of about 64 units She thinks her diet is relatively low in fat and carbohydrate but needs more detailed assessment Currently not able to exercise   Complications of diabetes: None evident   HYPERTENSION: Appears to be poorly controlled and on lisinopril only   PLAN:    She will start checking sugars with her Accu-Chek regularly  Discussed blood sugar targets both fasting and after meals  For her insulin resistance she will do better with using Humulin R U-500 insulin compared to NovoLog and this may also help with overnight blood sugars  Discussed in detail the formulation of concentrated insulin, how this is available in the insulin panel and dosage titration  She will start with 40 units to be taken 30 minutes before  breakfast and dinner and also 30 units before lunch meal  After 1 week if she still has consistently high readings over 200 she will go up by 10 units on each of the doses  For simplicity she will switch to once a day 60 units Lantus in the morning  She will follow-up short-term with diabetes educator for help with meal planning, review of glucose monitoring, use of alternative lancet devices and review of insulin regimen and compliance  Although she is interested in insulin pump she is not understanding how this works as well as usually lack of coverage by Medicaid for this for type 2 diabetes  She is also interested in the freestyle [place] system but will need to document checking blood sugar testing 4 times a day before this is approved  Will need short-term follow-up in about 4 weeks  She may be a candidate for [place] or Bydureon which may be better tolerated than 1.5 mg Trulicity and will discuss this further on the next visit  To follow-up with PCP regarding hypertension management  She will need to minimize the use of naproxen due to interaction with ACE inhibitor  Patient Instructions  Check blood sugars on waking up at least every other day  Also check blood sugars about 2 hours after a meal and do this after different meals by rotation  Recommended blood sugar levels on waking up is 90-130 and about 2 hours after meal is 130-160  Please bring your blood sugar monitor to each visit, thank you  LANTUS insulin: Start taking this only once a day in the morning with 60 units  Stop NovoLog  HUMULIN R U-500 insulin will replace NovoLog  Take this insulin 30 minutes before eating as follows  Take 40 units before breakfast, 30 units before lunch and 40 units before dinner  After 1 week if the blood sugars are still over 200 increase the Humulin R all doses by 10 units        Consultation note has been sent to the referring physician  Counseling time on subjects  discussed in assessment and plan sections is over 50% of today's 60 minute visit   Elayne Snare 02/10/2018, 3:42 PM   Note: This office note was prepared with Dragon voice recognition system technology. Any transcriptional errors that result from this process are unintentional.

## 2018-02-10 ENCOUNTER — Encounter: Payer: Self-pay | Admitting: Endocrinology

## 2018-02-10 ENCOUNTER — Ambulatory Visit: Payer: Medicaid Other | Admitting: Endocrinology

## 2018-02-10 ENCOUNTER — Ambulatory Visit: Payer: Medicaid Other | Attending: Nurse Practitioner | Admitting: *Deleted

## 2018-02-10 VITALS — BP 133/98 | HR 96 | Ht 64.0 in | Wt 217.0 lb

## 2018-02-10 DIAGNOSIS — E1165 Type 2 diabetes mellitus with hyperglycemia: Secondary | ICD-10-CM

## 2018-02-10 DIAGNOSIS — Z3042 Encounter for surveillance of injectable contraceptive: Secondary | ICD-10-CM | POA: Insufficient documentation

## 2018-02-10 DIAGNOSIS — E1169 Type 2 diabetes mellitus with other specified complication: Secondary | ICD-10-CM | POA: Diagnosis not present

## 2018-02-10 DIAGNOSIS — Z794 Long term (current) use of insulin: Secondary | ICD-10-CM

## 2018-02-10 DIAGNOSIS — E669 Obesity, unspecified: Secondary | ICD-10-CM

## 2018-02-10 DIAGNOSIS — I10 Essential (primary) hypertension: Secondary | ICD-10-CM | POA: Diagnosis not present

## 2018-02-10 LAB — POCT GLYCOSYLATED HEMOGLOBIN (HGB A1C): Hemoglobin A1C: 13 % — AB (ref 4.0–5.6)

## 2018-02-10 LAB — POCT URINE PREGNANCY: PREG TEST UR: NEGATIVE

## 2018-02-10 LAB — CERVICOVAGINAL ANCILLARY ONLY
BACTERIAL VAGINITIS: POSITIVE — AB
CANDIDA VAGINITIS: POSITIVE — AB
Chlamydia: NEGATIVE
Neisseria Gonorrhea: NEGATIVE
Trichomonas: POSITIVE — AB

## 2018-02-10 LAB — GLUCOSE, POCT (MANUAL RESULT ENTRY): POC Glucose: 412 mg/dl — AB (ref 70–99)

## 2018-02-10 MED ORDER — MEDROXYPROGESTERONE ACETATE 150 MG/ML IM SUSP
150.0000 mg | Freq: Once | INTRAMUSCULAR | Status: AC
  Administered 2018-02-10: 150 mg via INTRAMUSCULAR

## 2018-02-10 MED ORDER — INSULIN REGULAR HUMAN (CONC) 500 UNIT/ML ~~LOC~~ SOPN: PEN_INJECTOR | SUBCUTANEOUS | 0 refills | Status: DC

## 2018-02-10 NOTE — Patient Instructions (Signed)
Check blood sugars on waking up at least every other day  Also check blood sugars about 2 hours after a meal and do this after different meals by rotation  Recommended blood sugar levels on waking up is 90-130 and about 2 hours after meal is 130-160  Please bring your blood sugar monitor to each visit, thank you  LANTUS insulin: Start taking this only once a day in the morning with 60 units  Stop NovoLog  HUMULIN R U-500 insulin will replace NovoLog  Take this insulin 30 minutes before eating as follows  Take 40 units before breakfast, 30 units before lunch and 40 units before dinner  After 1 week if the blood sugars are still over 200 increase the Humulin R all doses by 10 units

## 2018-02-10 NOTE — Progress Notes (Signed)
Date last pap: Pt is aware she needs pap smear. Last Depo-Provera: 10/08/17 Side Effects if any: none Urine HCG indicated? yes.due for pregnancy test; has missed injections. Depo-Provera 150 mg IM given JZ:PHXTAV Sharlet Salina RN Next appointment due Nov 25 -  May 12, 2018.

## 2018-02-11 ENCOUNTER — Telehealth (HOSPITAL_COMMUNITY): Payer: Self-pay

## 2018-02-11 MED ORDER — METRONIDAZOLE 500 MG PO TABS: 500.0000 mg | ORAL_TABLET | Freq: Two times a day (BID) | ORAL | 0 refills | Status: DC

## 2018-02-11 MED FILL — metroNIDAZOLE 500 MG TABS: 500 | 7 days supply | Qty: 14 | Fill #0

## 2018-02-11 NOTE — Telephone Encounter (Signed)
Candida (yeast) is positive.  Prescription for fluconazole was given at the urgent care visit.  Pt called and made aware. Recheck or followup with PCP for further evaluation if symptoms are not improving. Answered all questions.  Bacterial vaginosis is positive. This was not treated at the urgent care visit.  Patient complains of persistent symptoms.  Flagyl 500 mg BID x 7 days #14 no refills sent to patients pharmacy of choice per Dr. Dayton Scrape.  Pt called and made aware of results and new prescription. Answered all questions and pt verbalized understanding.   Trichomonas is positive. Rx metronidazole 500mg  bid x 7d #14 no refills was sent to the pharmacy of record. PT called and made aware.  Educated patient to refrain from sexual intercourse for 7 days to give the medicine time to work. Sexual partners need to be notified and tested/treated. Condoms may reduce risk of reinfection.  Recheck for further evaluation if symptoms are not improving. Pt verbalized understanding.

## 2018-02-16 ENCOUNTER — Emergency Department (HOSPITAL_COMMUNITY): Payer: Medicaid Other

## 2018-02-16 ENCOUNTER — Other Ambulatory Visit: Payer: Self-pay

## 2018-02-16 ENCOUNTER — Emergency Department (HOSPITAL_COMMUNITY)
Admission: EM | Admit: 2018-02-16 | Discharge: 2018-02-16 | Payer: Medicaid Other | Attending: Emergency Medicine | Admitting: Emergency Medicine

## 2018-02-16 ENCOUNTER — Encounter (HOSPITAL_COMMUNITY): Payer: Self-pay | Admitting: Emergency Medicine

## 2018-02-16 DIAGNOSIS — Z79899 Other long term (current) drug therapy: Secondary | ICD-10-CM | POA: Insufficient documentation

## 2018-02-16 DIAGNOSIS — Z7984 Long term (current) use of oral hypoglycemic drugs: Secondary | ICD-10-CM | POA: Insufficient documentation

## 2018-02-16 DIAGNOSIS — I10 Essential (primary) hypertension: Secondary | ICD-10-CM | POA: Diagnosis not present

## 2018-02-16 DIAGNOSIS — N3000 Acute cystitis without hematuria: Secondary | ICD-10-CM | POA: Diagnosis not present

## 2018-02-16 DIAGNOSIS — R109 Unspecified abdominal pain: Secondary | ICD-10-CM

## 2018-02-16 DIAGNOSIS — E119 Type 2 diabetes mellitus without complications: Secondary | ICD-10-CM | POA: Insufficient documentation

## 2018-02-16 DIAGNOSIS — J45909 Unspecified asthma, uncomplicated: Secondary | ICD-10-CM | POA: Diagnosis not present

## 2018-02-16 DIAGNOSIS — Z87891 Personal history of nicotine dependence: Secondary | ICD-10-CM | POA: Insufficient documentation

## 2018-02-16 DIAGNOSIS — R1012 Left upper quadrant pain: Secondary | ICD-10-CM | POA: Diagnosis present

## 2018-02-16 LAB — COMPREHENSIVE METABOLIC PANEL
ALBUMIN: 4.3 g/dL (ref 3.5–5.0)
ALT: 21 U/L (ref 0–44)
AST: 16 U/L (ref 15–41)
Alkaline Phosphatase: 148 U/L — ABNORMAL HIGH (ref 38–126)
Anion gap: 10 (ref 5–15)
BILIRUBIN TOTAL: 0.9 mg/dL (ref 0.3–1.2)
BUN: 12 mg/dL (ref 6–20)
CO2: 27 mmol/L (ref 22–32)
Calcium: 9.7 mg/dL (ref 8.9–10.3)
Chloride: 102 mmol/L (ref 98–111)
Creatinine, Ser: 0.71 mg/dL (ref 0.44–1.00)
GFR calc Af Amer: >60 mL/min (ref >60)
GFR calc non Af Amer: >60 mL/min (ref >60)
GLUCOSE: 342 mg/dL — AB (ref 70–99)
POTASSIUM: 4.5 mmol/L (ref 3.5–5.1)
Sodium: 139 mmol/L (ref 135–145)
TOTAL PROTEIN: 7.7 g/dL (ref 6.5–8.1)

## 2018-02-16 LAB — CBC WITH DIFFERENTIAL/PLATELET
BASOS PCT: 0 %
Basophils Absolute: 0 10*3/uL (ref 0.0–0.1)
EOS ABS: 0 10*3/uL (ref 0.0–0.7)
Eosinophils Relative: 0 %
HEMATOCRIT: 46.2 % — AB (ref 36.0–46.0)
Hemoglobin: 16.3 g/dL — ABNORMAL HIGH (ref 12.0–15.0)
Lymphocytes Relative: 27 %
Lymphs Abs: 2.2 10*3/uL (ref 0.7–4.0)
MCH: 30.4 pg (ref 26.0–34.0)
MCHC: 35.3 g/dL (ref 30.0–36.0)
MCV: 86.2 fL (ref 78.0–100.0)
MONOS PCT: 6 %
Monocytes Absolute: 0.5 10*3/uL (ref 0.1–1.0)
NEUTROS ABS: 5.5 10*3/uL (ref 1.7–7.7)
Neutrophils Relative %: 67 %
Platelets: 346 10*3/uL (ref 150–400)
RBC: 5.36 MIL/uL — ABNORMAL HIGH (ref 3.87–5.11)
RDW: 11.8 % (ref 11.5–15.5)
WBC: 8.3 10*3/uL (ref 4.0–10.5)

## 2018-02-16 LAB — PREGNANCY, URINE: Preg Test, Ur: NEGATIVE

## 2018-02-16 LAB — URINALYSIS, ROUTINE W REFLEX MICROSCOPIC
Bilirubin Urine: NEGATIVE
KETONES UR: 20 mg/dL — AB
Nitrite: POSITIVE — AB
PROTEIN: 100 mg/dL — AB
RBC / HPF: >50 RBC/hpf — ABNORMAL HIGH (ref 0–5)
Specific Gravity, Urine: 1.036 — ABNORMAL HIGH (ref 1.005–1.030)
pH: 6 (ref 5.0–8.0)

## 2018-02-16 MED ORDER — FENTANYL CITRATE (PF) 100 MCG/2ML IJ SOLN
50.0000 ug | Freq: Once | INTRAMUSCULAR | Status: AC
  Administered 2018-02-16: 50 ug via INTRAVENOUS
  Filled 2018-02-16: qty 2

## 2018-02-16 MED ORDER — CEPHALEXIN 500 MG PO CAPS: 500.0000 mg | ORAL_CAPSULE | Freq: Three times a day (TID) | ORAL | 0 refills | Status: DC

## 2018-02-16 MED ORDER — CEPHALEXIN 500 MG PO CAPS: 500.0000 mg | ORAL_CAPSULE | Freq: Three times a day (TID) | ORAL | 0 refills | Status: AC

## 2018-02-16 MED ORDER — FLUCONAZOLE 150 MG PO TABS: 150.0000 mg | ORAL_TABLET | Freq: Every day | ORAL | 0 refills | Status: AC

## 2018-02-16 NOTE — ED Provider Notes (Signed)
[facility] DEPT Provider Note   CSN: [id number] Arrival date & time: 02/16/18  0750     History   Chief Complaint Chief Complaint  Patient presents with  . Flank Pain  . Abdominal Pain    HPI Sarah Garrison is a 37 y.o. female with past medical history of type 2 diabetes, asthma, hypertension, GERD, presenting to the ED with complaint of acute onset of left flank pain that began yesterday.  Describes pain as "feels like something is poking me."  Pain radiates around to her left lower abdomen.  Reports assoc dysuria and increased urinary frequency, no hematuria.  She took 800 mg of ibuprofen prior to arrival for pain.  No assoc N/V, fevers.  No history of nephrolithiasis.  Reports she was seen at urgent care 1 week ago and treated for yeast infection as well as trichomonas.  States she finished her antibiotics as prescribed with improvement in vaginal complaints.  The history is provided by the patient.    Past Medical History:  Diagnosis Date  . Asthma   . Diabetes mellitus without complication ([place])   . GERD (gastroesophageal reflux disease)   . Hypertension     Patient Active Problem List   Diagnosis Date Noted  . Uncontrolled type 2 diabetes mellitus with complication, with long-term current use of insulin ([place]) 12/13/2016  . Non-adherence to medical treatment 12/13/2016  . Asthma 07/30/2016  . 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/07/2016  . Encounter for pregnancy test 02/01/2016  . Leg swelling 09/29/2015  . Morbid obesity, unspecified obesity type ([place]) 09/29/2015  . DM neuropathy, type II diabetes mellitus ([place]) 08/24/2015    Past Surgical History:  Procedure Laterality Date  . CESAREAN SECTION    . TUBAL LIGATION    . VENTRAL HERNIA REPAIR N/A 11/27/2017   Procedure: Loop;  Surgeon: Erroll Luna, MD;  Location: [facility];  Service: General;  Laterality: N/A;  . WISDOM TOOTH EXTRACTION        OB History    Gravida  5   Para  4   Term      Preterm      AB  1   Living  4     SAB  1   TAB      Ectopic      Multiple      Live Births               Home Medications    Prior to Admission medications   Medication Sig Start Date End Date Taking? Authorizing Provider  albuterol (PROVENTIL HFA;VENTOLIN HFA) 108 (90 Base) MCG/ACT inhaler Inhale 1-2 puffs into the lungs every 6 (six) hours as needed for wheezing or shortness of breath. 11/20/17  Yes Gildardo Pounds, NP  atorvastatin (LIPITOR) 20 MG tablet Take 1 tablet (20 mg total) by mouth daily. 11/23/17  Yes Gildardo Pounds, NP  cyclobenzaprine (FLEXERIL) 10 MG tablet 1/2-1 up to 3 times daily prn muscle spasm Patient taking differently: Take 10 mg by mouth 3 (three) times daily as needed for muscle spasms.  01/09/18  Yes McClung, Angela M, PA-C  LANTUS SOLOSTAR 100 UNIT/ML Solostar Pen 34 units twice daily Patient taking differently: Inject 30 Units into the skin See admin instructions. Inject 30 units SQ in the morning and inject 34 units SQ in the evening 11/20/17  Yes Gildardo Pounds, NP  lisinopril (PRINIVIL,ZESTRIL) 20 MG tablet Take 1 tablet (20 mg total) by mouth  daily. 01/09/18  Yes McClung, Dionne Bucy, PA-C  loperamide (IMODIUM A-D) 2 MG tablet Take 1 tablet (2 mg total) by mouth 4 (four) times daily as needed for diarrhea or loose stools. 05/29/17  Yes Hairston, Maylon Peppers, FNP  metroNIDAZOLE (FLAGYL) 500 MG tablet Take 1 tablet (500 mg total) by mouth 2 (two) times daily. 02/11/18  Yes Raylene Everts, MD  nystatin (NYSTATIN) powder Apply topically 3 (three) times daily. Patient taking differently: Apply 1 g topically 3 (three) times daily.  09/23/17  Yes Charlott Rakes, MD  omeprazole (PRILOSEC) 20 MG capsule Take 1 capsule (20 mg total) by mouth daily. 07/24/17  Yes Tresa Garter, MD  Blood Glucose Monitoring Suppl (ACCU-CHEK AVIVA) device Use as instructed 3 times daily Patient not taking:  Reported on 02/16/2018 10/17/17   Charlott Rakes, MD  Blood Glucose Monitoring Suppl (TRUE METRIX METER) w/Device KIT Use as directed Patient not taking: Reported on 02/16/2018 06/25/17   Tresa Garter, MD  cephALEXin (KEFLEX) 500 MG capsule Take 1 capsule (500 mg total) by mouth 3 (three) times daily for 7 days. 02/16/18 02/23/18  Margaret Staggs, [place] N, PA-C  gabapentin (NEURONTIN) 300 MG capsule Take 2 capsules (600 mg total) by mouth 3 (three) times daily. Patient not taking: Reported on 02/16/2018 10/16/17   Argentina Donovan, PA-C  glucose blood (TRUE METRIX BLOOD GLUCOSE TEST) test strip Use as instructed Patient not taking: Reported on 02/16/2018 03/04/17   Tresa Garter, MD  insulin aspart (NOVOLOG) 100 UNIT/ML FlexPen Inject 15 Units into the skin 3 (three) times daily with meals. Take with largest meal. Patient not taking: Reported on 02/16/2018 11/20/17   Gildardo Pounds, NP  Insulin Pen Needle (ULTICARE PEN NEEDLES) 29G X 12.7MM MISC Use as instructed Patient not taking: Reported on 02/16/2018 11/20/17   Gildardo Pounds, NP  insulin regular human CONCENTRATED (HUMULIN R U-500 KWIKPEN) 500 UNIT/ML kwikpen 50 Units as measured on the pen, 30 minutes before each meal Patient taking differently: Inject 50 Units into the skin 3 (three) times daily with meals.  02/10/18   Elayne Snare, MD  metoCLOPramide (REGLAN) 10 MG tablet Take 1 tablet (10 mg total) by mouth every 8 (eight) hours as needed for nausea. Patient not taking: Reported on 02/16/2018 10/16/17   Argentina Donovan, PA-C  naproxen (NAPROSYN) 500 MG tablet Take 1 tablet (500 mg total) by mouth 2 (two) times daily with a meal. X 7 days then prn pain Patient not taking: Reported on 02/16/2018 01/09/18   Argentina Donovan, PA-C  Olopatadine HCl 0.2 % SOLN Apply 1 drop to each eye daily Patient not taking: Reported on 02/16/2018 11/20/17   Gildardo Pounds, NP  ondansetron (ZOFRAN) 8 MG tablet Take 1 tablet (8 mg total) by mouth every 8 (eight)  hours as needed for nausea or vomiting. Patient not taking: Reported on 02/16/2018 06/27/17   Argentina Donovan, PA-C  traZODone (DESYREL) 100 MG tablet Take 1 tablet (100 mg total) by mouth at bedtime. Patient not taking: Reported on 02/16/2018 12/27/17   Gildardo Pounds, NP  TRUEPLUS LANCETS 28G MISC Use as directed Patient not taking: Reported on 02/16/2018 03/04/17   Tresa Garter, MD    Family History Family History  Problem Relation Age of Onset  . Asthma Mother   . Cancer Mother        brain cancer  . Kidney failure Mother   . Asthma Father   . Diabetes Brother   .  Diabetes Maternal Grandmother     Social History Social History   Tobacco Use  . Smoking status: Former Smoker    Packs/day: 0.10    Types: Cigarettes    Last attempt to quit: 11/27/2017    Years since quitting: 0.2  . Smokeless tobacco: Never Used  Substance Use Topics  . Alcohol use: No  . Drug use: No     Allergies   Morphine and related; Glyburide; Ivp dye [iodinated diagnostic agents]; Oxycodone; and Vicodin [hydrocodone-acetaminophen]   Review of Systems Review of Systems  Constitutional: Negative for fever.  Gastrointestinal: Positive for abdominal pain. Negative for constipation, diarrhea, nausea and vomiting.  Genitourinary: Positive for dysuria, flank pain and frequency. Negative for hematuria, vaginal discharge and vaginal pain.  All other systems reviewed and are negative.    Physical Exam Updated Vital Signs BP 138/90   Pulse 90   Temp 98.1 F (36.7 C) (Oral)   Resp 16   SpO2 99%   Physical Exam  Constitutional: She appears well-developed and well-nourished.  Appears uncomfortable  HENT:  Head: Normocephalic and atraumatic.  Mouth/Throat: Oropharynx is clear and moist.  Eyes: Conjunctivae are normal.  Neck: Normal range of motion. Neck supple.  Cardiovascular: Regular rhythm and normal heart sounds.  Slightly tachycardic  Pulmonary/Chest: Effort normal and breath  sounds normal. No respiratory distress.  Abdominal: Soft. Bowel sounds are normal. There is tenderness (Left lower quadrant extending around to left flank and CVA). There is no rebound and no guarding.  Neurological: She is alert.  Skin: Skin is warm.  Psychiatric: She has a normal mood and affect. Her behavior is normal.  Nursing note and vitals reviewed.    ED Treatments / Results  Labs (all labs ordered are listed, but only abnormal results are displayed) Labs Reviewed  URINALYSIS, ROUTINE W REFLEX MICROSCOPIC - Abnormal; Notable for the following components:      Result Value   APPearance CLOUDY (*)    Specific Gravity, Urine 1.036 (*)    Glucose, UA >=500 (*)    Hgb urine dipstick LARGE (*)    Ketones, ur 20 (*)    Protein, ur 100 (*)    Nitrite POSITIVE (*)    Leukocytes, UA LARGE (*)    RBC / HPF >50 (*)    WBC, UA >50 (*)    Bacteria, UA RARE (*)    All other components within normal limits  CBC WITH DIFFERENTIAL/PLATELET - Abnormal; Notable for the following components:   RBC 5.36 (*)    Hemoglobin 16.3 (*)    HCT 46.2 (*)    All other components within normal limits  COMPREHENSIVE METABOLIC PANEL - Abnormal; Notable for the following components:   Glucose, Bld 342 (*)    Alkaline Phosphatase 148 (*)    All other components within normal limits  URINE CULTURE  PREGNANCY, URINE    EKG None  Radiology Ct Renal Stone Study  Result Date: 02/16/2018 CLINICAL DATA:  Left-sided flank pain with urination EXAM: CT ABDOMEN AND PELVIS WITHOUT CONTRAST TECHNIQUE: Multidetector CT imaging of the abdomen and pelvis was performed following the standard protocol without IV contrast. COMPARISON:  12/11/2016 FINDINGS: Lower chest: No acute abnormality. Hepatobiliary: No focal liver abnormality is seen. No gallstones, gallbladder wall thickening, or biliary dilatation. Pancreas: Unremarkable. No pancreatic ductal dilatation or surrounding inflammatory changes. Spleen: Normal in  size without focal abnormality. Adrenals/Urinary Tract: Adrenal glands are within normal limits bilaterally. The kidneys are well visualized bilaterally without renal calculi. Minimal  prominence of the left ureter is noted although no definitive ureteral stone is seen. Bladder is decompressed Stomach/Bowel: Stomach is within normal limits. Appendix appears normal. No evidence of bowel wall thickening, distention, or inflammatory changes. Vascular/Lymphatic: No significant vascular findings are present. No enlarged abdominal or pelvic lymph nodes. Reproductive: Uterus and bilateral adnexa are unremarkable. Other: No abdominal wall hernia or abnormality. No abdominopelvic ascites. Musculoskeletal: No acute or significant osseous findings. IMPRESSION: Very minimal prominence of the left ureter. This may be related to edema from recently passed stone given the patient's clinical history. No definitive ureteral stones are seen. No other focal abnormality is noted. Electronically Signed   By: Inez Catalina M.D.   On: 02/16/2018 10:03    Procedures Procedures (including critical care time)  Medications Ordered in ED Medications  fentaNYL (SUBLIMAZE) injection 50 mcg (50 mcg Intravenous Given 02/16/18 0924)     Initial Impression / Assessment and Plan / ED Course  I have reviewed the triage vital signs and the nursing notes.  Pertinent labs & imaging results that were available during my care of the patient were reviewed by me and considered in my medical decision making (see chart for details).  Clinical Course as of Feb 16 1045  Sun Feb 16, 2018  1014 Pt discussed with Dr. Eulis Foster. Plan to treat for UTI and instruct pt to follow up with PCP.    [JR]  1610 Patient reevaluated.  Reports improvement in symptoms.  Discussed plan to treat for UTI and recommendation to follow-up with PCP.  Agreeable to plan.  Safe for discharge.   [JR]    Clinical Course User Index [JR] Jourdan Maldonado, [place] N, PA-C   Patient  presenting with left flank pain and dysuria.  Patient recently treated for yeast infection and trichomonas though reports improvement in those symptoms however dysuria persists.  On exam, left CVA and left lateral abdominal tenderness.  Afebrile.  Normal vital signs.  Labs are reassuring without a leukocytosis and normal kidney function.  Urine does show UTI.  Culture sent.  CT stone study indicates possibility of recently passed stone, though no findings consistent with pyelonephritis.  Pt to be dc home with keflex and instructions to follow up with PCP.  Patient discussed with Dr. Eulis Foster who guided treatment plan.  Discussed results, findings, treatment and follow up. Patient advised of return precautions. Patient verbalized understanding and agreed with plan.  Final Clinical Impressions(s) / ED Diagnoses   Final diagnoses:  Left flank pain  Acute cystitis without hematuria    ED Discharge Orders         Ordered    cephALEXin (KEFLEX) 500 MG capsule  3 times daily,   Status:  Discontinued     02/16/18 1045    cephALEXin (KEFLEX) 500 MG capsule  3 times daily     02/16/18 1046           Aries Kasa, [place] N, [place] 02/16/18 1048    Daleen Bo, MD 02/17/18 1630

## 2018-02-16 NOTE — Discharge Instructions (Addendum)
Please read instructions below. Take your antibiotic, keflex, as directed until it is gone. Drink plenty of water. Schedule an appointment with your primary care provider to follow up on your visit today. Return to the ER if you develop a fever, have nausea/vomiting, or new or worsening symptoms.

## 2018-02-16 NOTE — ED Triage Notes (Addendum)
Pt c/o L flank pain.Pt reports pain with urination. Pt states pain radiating to L mid abdomen.

## 2018-02-18 ENCOUNTER — Encounter: Payer: Medicaid Other | Attending: Endocrinology | Admitting: Nutrition

## 2018-02-18 DIAGNOSIS — E118 Type 2 diabetes mellitus with unspecified complications: Secondary | ICD-10-CM

## 2018-02-18 DIAGNOSIS — IMO0002 Reserved for concepts with insufficient information to code with codable children: Secondary | ICD-10-CM

## 2018-02-18 DIAGNOSIS — E1165 Type 2 diabetes mellitus with hyperglycemia: Secondary | ICD-10-CM

## 2018-02-18 DIAGNOSIS — Z794 Long term (current) use of insulin: Secondary | ICD-10-CM | POA: Diagnosis not present

## 2018-02-18 DIAGNOSIS — Z713 Dietary counseling and surveillance: Secondary | ICD-10-CM | POA: Diagnosis present

## 2018-02-18 LAB — URINE CULTURE: Culture: >=100000 — AB

## 2018-02-18 NOTE — Progress Notes (Signed)
Patient reported that she had not started the U-500 R insulin because "the pharmacy said it needed us to do something".  The pharmacy was called and they reported that the order needed a prior auth., and that they had faxed us the paperwork on the 9th, and again on the 12th.  They had the correct fax number--They were told to refax, and Rodney Boozeasha called and the authorization was requested.  Patient was told that she can call the pharmacy tomorrow afternoon to see if they had gotten the approval.   Patient did not bring meter in, saying she lost this.  She was given the new accuchek,  and shown how to use it.   She reports that she has stopped her Novolog as instructed.  She was told to restart this with meals per her old prescription dose.  She reported that she had some at home to use today and tomorrow.   Written instructions were given for the R insulin per Dr. Remus BlakeKumar's note, and the need to wait 30 min. After giving the insulin, before eating.  She reported good understanding of this , and had no final questions

## 2018-02-18 NOTE — Patient Instructions (Signed)
Take Novolog dose until you pick up the R insulin. Continue taking the Lantus dose as directed Call if questions.

## 2018-02-19 ENCOUNTER — Telehealth: Payer: Self-pay | Admitting: *Deleted

## 2018-02-19 NOTE — Telephone Encounter (Signed)
Post ED Visit - Positive Culture Follow-up  Culture report reviewed by antimicrobial stewardship pharmacist:  []  Enzo BiNathan Batchelder, Pharm.D. []  Celedonio MiyamotoJeremy Frens, Pharm.D., BCPS AQ-ID []  Garvin FilaMike Maccia, Pharm.D., BCPS []  Georgina PillionElizabeth Martin, Pharm.D., BCPS []  Buffalo LakeMinh Pham, [place]Pharm.D., BCPS, AAHIVP []  Estella HuskMichelle Turner, Pharm.D., BCPS, AAHIVP []  Lysle Pearlachel Rumbarger, PharmD, BCPS []  Phillips Climeshuy Dang, PharmD, BCPS []  Agapito GamesAlison Masters, PharmD, BCPS []  Verlan FriendsErin Deja, PharmD CaPierce, PharmD  Positive urine culture Treated with Cephalexin, organism sensitive to the same and no further patient follow-up is required at this time.  Virl AxeRobertson, Aneisha Skyles [facility] 02/19/2018, 12:19 PM

## 2018-02-21 ENCOUNTER — Ambulatory Visit: Payer: Medicaid Other | Admitting: Nurse Practitioner

## 2018-02-24 ENCOUNTER — Other Ambulatory Visit: Payer: Self-pay

## 2018-02-24 DIAGNOSIS — E1140 Type 2 diabetes mellitus with diabetic neuropathy, unspecified: Secondary | ICD-10-CM

## 2018-02-24 MED ORDER — INSULIN PEN NEEDLE 29G X 12.7MM MISC: 11 refills | Status: DC

## 2018-02-24 MED FILL — TRUEplus 5-BEVEL PEN NEEDLE: 31G X 8 MM | 30 days supply | Qty: 100 | Fill #0

## 2018-03-03 ENCOUNTER — Other Ambulatory Visit: Payer: Self-pay | Admitting: Endocrinology

## 2018-03-03 ENCOUNTER — Other Ambulatory Visit (INDEPENDENT_AMBULATORY_CARE_PROVIDER_SITE_OTHER): Payer: Medicaid Other

## 2018-03-03 DIAGNOSIS — Z794 Long term (current) use of insulin: Secondary | ICD-10-CM | POA: Diagnosis not present

## 2018-03-03 DIAGNOSIS — E1165 Type 2 diabetes mellitus with hyperglycemia: Secondary | ICD-10-CM

## 2018-03-03 LAB — BASIC METABOLIC PANEL
BUN: 13 mg/dL (ref 6–23)
CO2: 24 mEq/L (ref 19–32)
Calcium: 9.1 mg/dL (ref 8.4–10.5)
Chloride: 100 mEq/L (ref 96–112)
Creatinine, Ser: 0.7 mg/dL (ref 0.40–1.20)
GFR: 121.71 mL/min (ref >60.00)
GLUCOSE: 485 mg/dL — AB (ref 70–99)
POTASSIUM: 3.8 meq/L (ref 3.5–5.1)
SODIUM: 132 meq/L — AB (ref 135–145)

## 2018-03-03 LAB — LIPID PANEL
CHOLESTEROL: 300 mg/dL — AB (ref 0–200)
HDL: 46.3 mg/dL (ref >39.00)
LDL CALC: 216 mg/dL — AB (ref 0–99)
NonHDL: 253.67
Total CHOL/HDL Ratio: 6
Triglycerides: 187 mg/dL — ABNORMAL HIGH (ref 0.0–149.0)
VLDL: 37.4 mg/dL (ref 0.0–40.0)

## 2018-03-03 MED ORDER — ACCU-CHEK FASTCLIX LANCETS MISC: 3 refills | Status: DC

## 2018-03-03 MED ORDER — GLUCOSE BLOOD VI STRP: ORAL_STRIP | 12 refills | Status: DC

## 2018-03-03 MED FILL — ACCU-CHEK GUIDE TEST STRIP: 25 days supply | Qty: 100 | Fill #0

## 2018-03-03 MED FILL — ACCU-CHEK FASTCLIX LANCETS: 26 days supply | Qty: 102 | Fill #0

## 2018-03-03 NOTE — Telephone Encounter (Signed)
Rx accu-check fastclix lancet and accu-check test strip [facility]

## 2018-03-04 ENCOUNTER — Ambulatory Visit: Payer: Medicaid Other

## 2018-03-04 ENCOUNTER — Ambulatory Visit: Payer: Medicaid Other | Attending: Family Medicine | Admitting: Family Medicine

## 2018-03-04 VITALS — BP 136/77 | HR 105 | Temp 98.3°F | Resp 17 | Ht 64.0 in | Wt 212.2 lb

## 2018-03-04 DIAGNOSIS — B9689 Other specified bacterial agents as the cause of diseases classified elsewhere: Secondary | ICD-10-CM | POA: Insufficient documentation

## 2018-03-04 DIAGNOSIS — N390 Urinary tract infection, site not specified: Secondary | ICD-10-CM | POA: Diagnosis present

## 2018-03-04 DIAGNOSIS — K219 Gastro-esophageal reflux disease without esophagitis: Secondary | ICD-10-CM | POA: Insufficient documentation

## 2018-03-04 DIAGNOSIS — E1165 Type 2 diabetes mellitus with hyperglycemia: Secondary | ICD-10-CM

## 2018-03-04 DIAGNOSIS — R399 Unspecified symptoms and signs involving the genitourinary system: Secondary | ICD-10-CM

## 2018-03-04 DIAGNOSIS — Z833 Family history of diabetes mellitus: Secondary | ICD-10-CM | POA: Diagnosis not present

## 2018-03-04 DIAGNOSIS — Z79899 Other long term (current) drug therapy: Secondary | ICD-10-CM | POA: Insufficient documentation

## 2018-03-04 DIAGNOSIS — Z885 Allergy status to narcotic agent status: Secondary | ICD-10-CM | POA: Insufficient documentation

## 2018-03-04 DIAGNOSIS — E1142 Type 2 diabetes mellitus with diabetic polyneuropathy: Secondary | ICD-10-CM | POA: Diagnosis not present

## 2018-03-04 DIAGNOSIS — Z87891 Personal history of nicotine dependence: Secondary | ICD-10-CM | POA: Insufficient documentation

## 2018-03-04 DIAGNOSIS — Z794 Long term (current) use of insulin: Secondary | ICD-10-CM | POA: Diagnosis not present

## 2018-03-04 LAB — POCT URINALYSIS DIP (CLINITEK)
BILIRUBIN UA: NEGATIVE mg/dL
Bilirubin, UA: NEGATIVE
Glucose, UA: 500 mg/dL — AB
Leukocytes, UA: NEGATIVE
Nitrite, UA: NEGATIVE
PH UA: 6 (ref 5.0–8.0)
PROTEIN: NEGATIVE
Spec Grav, UA: 1.01 (ref 1.010–1.025)
Urobilinogen, UA: 0.2 E.U./dL

## 2018-03-04 LAB — GLUCOSE, POCT (MANUAL RESULT ENTRY): POC Glucose: 384 mg/dl — AB (ref 70–99)

## 2018-03-04 LAB — FRUCTOSAMINE: FRUCTOSAMINE: 473 umol/L — AB (ref 0–285)

## 2018-03-04 MED ORDER — DOXYCYCLINE HYCLATE 100 MG PO CAPS: 100.0000 mg | ORAL_CAPSULE | Freq: Two times a day (BID) | ORAL | 0 refills | Status: DC

## 2018-03-04 MED ORDER — FLUCONAZOLE 150 MG PO TABS: 150.0000 mg | ORAL_TABLET | Freq: Once | ORAL | 0 refills | Status: AC

## 2018-03-04 MED ORDER — PANTOPRAZOLE SODIUM 40 MG PO TBEC: 40.0000 mg | DELAYED_RELEASE_TABLET | Freq: Every day | ORAL | 3 refills | Status: DC

## 2018-03-04 MED FILL — FLUCONAZOLE 150 MG TABS: 150 | 3 days supply | Qty: 3 | Fill #0

## 2018-03-04 MED FILL — DOXYCYCLINE HYCLATE 100 MG: 100 | 10 days supply | Qty: 20 | Fill #0

## 2018-03-04 MED FILL — PANTOPRAZOLE SOD DR 40 MG T: 40 | 30 days supply | Qty: 30 | Fill #0

## 2018-03-04 NOTE — Patient Instructions (Signed)
Continue diabetes management per Dr. Lucianne Muss.  Increase intake of water, recommended 6-8 glassed or 6 bottle per day. This will help reduce risk of UTI reoccurrences. Cranberry supplements have been known to reduce reoccurrence of UTI.   Hyperglycemia Hyperglycemia is when the sugar (glucose) level in your blood is too high. It may not cause symptoms. If you do have symptoms, they may include warning signs, such as:  Feeling more thirsty than normal.  Hunger.  Feeling tired.  Needing to pee (urinate) more than normal.  Blurry eyesight (vision).  You may get other symptoms as it gets worse, such as:  Dry mouth.  Not being hungry (loss of appetite).  Fruity-smelling breath.  Weakness.  Weight gain or loss that is not planned. Weight loss may be fast.  A tingling or numb feeling in your hands or feet.  Headache.  Skin that does not bounce back quickly when it is lightly pinched and released (poor skin turgor).  Pain in your belly (abdomen).  Cuts or bruises that heal slowly.  High blood sugar can happen to people who do or do not have diabetes. High blood sugar can happen slowly or quickly, and it can be an emergency. Follow these instructions at home: General instructions  Take over-the-counter and prescription medicines only as told by your doctor.  Do not use products that contain nicotine or tobacco, such as cigarettes and e-cigarettes. If you need help quitting, ask your doctor.  Limit alcohol intake to no more than 1 drink per day for nonpregnant women and 2 drinks per day for men. One drink equals 12 oz of beer, 5 oz of wine, or 1 oz of hard liquor.  Manage stress. If you need help with this, ask your doctor.  Keep all follow-up visits as told by your doctor. This is important. Eating and drinking  Stay at a healthy weight.  Exercise regularly, as told by your doctor.  Drink enough fluid, especially when you: ? Exercise. ? Get sick. ? Are in hot  temperatures.  Eat healthy foods, such as: ? Low-fat (lean) proteins. ? Complex carbs (complex carbohydrates), such as whole wheat bread or brown rice. ? Fresh fruits and vegetables. ? Low-fat dairy products. ? Healthy fats.  Drink enough fluid to keep your pee (urine) clear or pale yellow. If you have diabetes:  Make sure you know the symptoms of hyperglycemia.  Follow your diabetes management plan, as told by your doctor. Make sure you: ? Take insulin and medicines as told. ? Follow your exercise plan. ? Follow your meal plan. Eat on time. Do not skip meals. ? Check your blood sugar as often as told. Make sure to check before and after exercise. If you exercise longer or in a different way than you normally do, check your blood sugar more often. ? Follow your sick day plan whenever you cannot eat or drink normally. Make this plan ahead of time with your doctor.  Share your diabetes management plan with people in your workplace, school, and household.  Check your urine for ketones when you are ill and as told by your doctor.  Carry a card or wear jewelry that says that you have diabetes. Contact a doctor if:  Your blood sugar level is higher than 240 mg/dL (16.1 mmol/L) for 2 days in a row.  You have problems keeping your blood sugar in your target range.  High blood sugar happens often for you. Get help right away if:  You have trouble breathing.  You have a change in how you think, feel, or act (mental status).  You feel sick to your stomach (nauseous), and that feeling does not go away.  You cannot stop throwing up (vomiting). These symptoms may be an emergency. Do not wait to see if the symptoms will go away. Get medical help right away. Call your local emergency services (911 in the [place] Do not drive yourself to the hospital. Summary  Hyperglycemia is when the sugar (glucose) level in your blood is too high.  High blood sugar can happen to people who do or do  not have diabetes.  Make sure you drink enough fluids, eat healthy foods, and exercise regularly.  Contact your doctor if you have problems keeping your blood sugar in your target range. This information is not intended to replace advice given to you by your health care provider. Make sure you discuss any questions you have with your health care provider. Document Released: 03/18/2009 Document Revised: 02/06/2016 Document Reviewed: 02/06/2016 Elsevier Interactive Patient Education  2017 [employer]  Urinary Tract Infection, Adult A urinary tract infection (UTI) is an infection of any part of the urinary tract. The urinary tract includes the:  Kidneys.  Ureters.  Bladder.  Urethra.  These organs make, store, and get rid of pee (urine) in the body. Follow these instructions at home:  Take over-the-counter and prescription medicines only as told by your doctor.  If you were prescribed an antibiotic medicine, take it as told by your doctor. Do not stop taking the antibiotic even if you start to feel better.  Avoid the following drinks: ? Alcohol. ? Caffeine. ? Tea. ? Carbonated drinks.  Drink enough fluid to keep your pee clear or pale yellow.  Keep all follow-up visits as told by your doctor. This is important.  Make sure to: ? Empty your bladder often and completely. Do not to hold pee for long periods of time. ? Empty your bladder before and after sex. ? Wipe from front to back after a bowel movement if you are female. Use each tissue one time when you wipe. Contact a doctor if:  You have back pain.  You have a fever.  You feel sick to your stomach (nauseous).  You throw up (vomit).  Your symptoms do not get better after 3 days.  Your symptoms go away and then come back. Get help right away if:  You have very bad back pain.  You have very bad lower belly (abdominal) pain.  You are throwing up and cannot keep down any medicines or water. This information  is not intended to replace advice given to you by your health care provider. Make sure you discuss any questions you have with your health care provider. Document Released: 11/07/2007 Document Revised: 10/27/2015 Document Reviewed: 04/11/2015 Elsevier Interactive Patient Education  [employer].

## 2018-03-04 NOTE — Progress Notes (Signed)
Patient ID: Sarah Garrison, female    DOB: 1980/11/10, 37 y.o.   MRN: [id number]  PCP: Claiborne Rigg, NP  Chief Complaint  Patient presents with  . Urinary Tract Infection    x 1-2 weeks. urinary frequency, odor, lower abdominal pain & pressure sensation after urinating. was treated in September    Subjective:  HPI  Sarah Garrison is a 37 y.o. female presents for evaluation UTI symptoms. Patient is uncontrolled diabetic who is followed by Dr. Lucianne Muss at Endocrinology. Complains of dysuria, flank pain, urine frequency, and lower abdominal pressure. Denies fever, chills, nausea, or vomiting.   Type 2 uncontrolled diabetes Glucose elevated on arrival today. She has not taken prescribed insulin today. She is asymptomatic.  Acid Reflux Requesting to try a different medication. Previously prescribed omeprazole which was ineffective for improving symptoms. Acid reflux is worst at night. Unable to associated with food.  Social History   Socioeconomic History  . Marital status: Single    Spouse name: Not on file  . Number of children: Not on file  . Years of education: Not on file  . Highest education level: Not on file  Occupational History  . Not on file  Social Needs  . Financial resource strain: Not on file  . Food insecurity:    Worry: Not on file    Inability: Not on file  . Transportation needs:    Medical: Not on file    Non-medical: Not on file  Tobacco Use  . Smoking status: Former Smoker    Packs/day: 0.10    Types: Cigarettes    Last attempt to quit: 11/27/2017    Years since quitting: 0.2  . Smokeless tobacco: Never Used  Substance and Sexual Activity  . Alcohol use: No  . Drug use: No  . Sexual activity: Not Currently    Birth control/protection: Surgical  Lifestyle  . Physical activity:    Days per week: Not on file    Minutes per session: Not on file  . Stress: Not on file  Relationships  . Social connections:    Talks on phone: Not on file     Gets together: Not on file    Attends religious service: Not on file    Active member of club or organization: Not on file    Attends meetings of clubs or organizations: Not on file    Relationship status: Not on file  . Intimate partner violence:    Fear of current or ex partner: Not on file    Emotionally abused: Not on file    Physically abused: Not on file    Forced sexual activity: Not on file  Other Topics Concern  . Not on file  Social History Narrative  . Not on file    Family History  Problem Relation Age of Onset  . Asthma Mother   . Cancer Mother        brain cancer  . Kidney failure Mother   . Asthma Father   . Diabetes Brother   . Diabetes Maternal Grandmother      Review of Systems  Patient Active Problem List   Diagnosis Date Noted  . Uncontrolled type 2 diabetes mellitus with complication, with long-term current use of insulin (HCC) 12/13/2016  . Non-adherence to medical treatment 12/13/2016  . Asthma 07/30/2016  . 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/07/2016  . Encounter for pregnancy test 02/01/2016  . Leg swelling 09/29/2015  . Morbid obesity, unspecified obesity type (HCC) 09/29/2015  .  DM neuropathy, type II diabetes mellitus (HCC) 08/24/2015    Allergies  Allergen Reactions  . Morphine And Related Shortness Of Breath  . Glyburide Diarrhea and Other (See Comments)    Reaction:  Nose bleeds   . Ivp Dye [Iodinated Diagnostic Agents] Other (See Comments)    Shortness of breath.    . Oxycodone Other (See Comments)    Pt states that this medication makes her "dream about rabbits chasing" her.    . Vicodin [Hydrocodone-Acetaminophen] Other (See Comments)    Pt states that this medication makes her "dream about rabbits chasing" her.      Prior to Admission medications   Medication Sig Start Date End Date Taking? Authorizing Provider  ACCU-CHEK FASTCLIX LANCETS MISC Test 4 times daily 03/03/18   Reather Littler, MD  albuterol (PROVENTIL HFA;VENTOLIN HFA) 108  (90 Base) MCG/ACT inhaler Inhale 1-2 puffs into the lungs every 6 (six) hours as needed for wheezing or shortness of breath. 11/20/17   Claiborne Rigg, NP  atorvastatin (LIPITOR) 20 MG tablet Take 1 tablet (20 mg total) by mouth daily. 11/23/17   Claiborne Rigg, NP  cyclobenzaprine (FLEXERIL) 10 MG tablet 1/2-1 up to 3 times daily prn muscle spasm Patient taking differently: Take 10 mg by mouth 3 (three) times daily as needed for muscle spasms.  01/09/18   Anders Simmonds, PA-C  glucose blood (ACCU-CHEK GUIDE) test strip Test 4 times daily. 03/03/18   Reather Littler, MD  Insulin Pen Needle (ULTICARE PEN NEEDLES) 29G X 12.7MM MISC Use as instructed 02/24/18   Reather Littler, MD  insulin regular human CONCENTRATED (HUMULIN R U-500 KWIKPEN) 500 UNIT/ML kwikpen 50 Units as measured on the pen, 30 minutes before each meal Patient taking differently: Inject 50 Units into the skin 3 (three) times daily with meals.  02/10/18   Reather Littler, MD  LANTUS SOLOSTAR 100 UNIT/ML Solostar Pen 34 units twice daily Patient taking differently: Inject 30 Units into the skin See admin instructions. Inject 30 units SQ in the morning and inject 34 units SQ in the evening 11/20/17   Claiborne Rigg, NP  lisinopril (PRINIVIL,ZESTRIL) 20 MG tablet Take 1 tablet (20 mg total) by mouth daily. 01/09/18   Anders Simmonds, PA-C  loperamide (IMODIUM A-D) 2 MG tablet Take 1 tablet (2 mg total) by mouth 4 (four) times daily as needed for diarrhea or loose stools. 05/29/17   Lizbeth Bark, FNP  naproxen (NAPROSYN) 500 MG tablet Take 1 tablet (500 mg total) by mouth 2 (two) times daily with a meal. X 7 days then prn pain Patient not taking: Reported on 02/16/2018 01/09/18   Anders Simmonds, PA-C  nystatin (NYSTATIN) powder Apply topically 3 (three) times daily. Patient taking differently: Apply 1 g topically 3 (three) times daily.  09/23/17   Hoy Register, MD  omeprazole (PRILOSEC) 20 MG capsule Take 1 capsule (20 mg total) by mouth  daily. 07/24/17   Quentin Angst, MD    Past Medical, Surgical Family and Social History reviewed and updated.    Objective:   Today's Vitals   03/04/18 0831  BP: 136/77  Pulse: (!) 105  Resp: 17  Temp: 98.3 F (36.8 C)  TempSrc: Oral  SpO2: 98%  Weight: 212 lb 3.2 oz (96.3 kg)  Height: 5\' 4"  (1.626 m)    Wt Readings from Last 3 Encounters:  03/04/18 212 lb 3.2 oz (96.3 kg)  02/10/18 217 lb (98.4 kg)  01/09/18 212 lb 9.6 oz (96.4  kg)     Physical Exam General appearance: alert, well developed, well nourished, cooperative and in no distress Head: Normocephalic, without obvious abnormality, atraumatic Respiratory: Respirations even and unlabored, normal respiratory rate Heart: Regular rate, rhythm. No gallops, murmurs, or extra heart sounds noted. Extremities: No gross deformities Skin: Skin color, texture, turgor normal. No rashes seen  Psych: Appropriate mood and affect. Neurologic: Mental status: Alert, oriented to person, place, and time, thought content appropriate. Lab Results  Component Value Date   POCGLU 384 (A) 03/04/2018   POCGLU 412 (A) 02/10/2018   POCGLU 367 (A) 01/09/2018    Lab Results  Component Value Date   HGBA1C 13.0 (A) 02/10/2018     Assessment & Plan:  1. UTI symptoms  UA negative of leucocytes. Patient is symptomatic today and has recently been treated for UTI. Will order Urine Culture and treat with a broad-spectrum antibiotic.  Doxycyline 100 mg BID x 10 days   2. Type 2 diabetes mellitus with hyperglycemia, with long-term current use of insulin (HCC) - POCT glucose (manual entry)-384 Patient administered from personal supply Humilin 500, 50 units. Recheck of blood sugar 15 minutes after administration   Meds ordered this encounter  Medications  . pantoprazole (PROTONIX) 40 MG tablet    Sig: Take 1 tablet (40 mg total) by mouth daily.    Dispense:  30 tablet    Refill:  3  . fluconazole (DIFLUCAN) 150 MG tablet    Sig: Take  1 tablet (150 mg total) by mouth once for 1 dose. Repeat if needed    Dispense:  3 tablet    Refill:  0  . doxycycline (VIBRAMYCIN) 100 MG capsule    Sig: Take 1 capsule (100 mg total) by mouth 2 (two) times daily.    Dispense:  20 capsule    Refill:  0     If symptoms worsen or do not improve, return for follow-up, follow-up with PCP, or at the emergency department if severity of symptoms warrant a higher level of care.   A total of 25  minutes spent, greater than 50 % of this time was spent counseling and coordination of care.     Godfrey Pick. Tiburcio Pea, MSN, [facility]  [address] [place], [place], [place] [address] [phone]

## 2018-03-06 LAB — URINE CULTURE

## 2018-03-06 MED ORDER — CIPROFLOXACIN HCL 500 MG PO TABS: 500.0000 mg | ORAL_TABLET | Freq: Two times a day (BID) | ORAL | 0 refills | Status: DC

## 2018-03-06 MED FILL — CIPROFLOXACIN HCL 500 MG TA: 500 | 10 days supply | Qty: 20 | Fill #0

## 2018-03-06 NOTE — Addendum Note (Signed)
Addended by: Bing Neighbors on: 03/06/2018 01:53 PM   Modules accepted: Orders

## 2018-03-06 NOTE — Progress Notes (Signed)
Patient notified of results. Expressed understanding.

## 2018-03-07 ENCOUNTER — Encounter: Payer: Self-pay | Admitting: Podiatry

## 2018-03-07 ENCOUNTER — Other Ambulatory Visit: Payer: Self-pay | Admitting: Nurse Practitioner

## 2018-03-07 ENCOUNTER — Ambulatory Visit: Payer: Medicaid Other | Admitting: Podiatry

## 2018-03-07 DIAGNOSIS — B351 Tinea unguium: Secondary | ICD-10-CM | POA: Diagnosis not present

## 2018-03-07 DIAGNOSIS — M79674 Pain in right toe(s): Secondary | ICD-10-CM

## 2018-03-07 DIAGNOSIS — L600 Ingrowing nail: Secondary | ICD-10-CM

## 2018-03-07 DIAGNOSIS — K529 Noninfective gastroenteritis and colitis, unspecified: Secondary | ICD-10-CM

## 2018-03-07 DIAGNOSIS — M79675 Pain in left toe(s): Secondary | ICD-10-CM

## 2018-03-07 MED ORDER — LOPERAMIDE HCL 2 MG PO TABS: 2.0000 mg | ORAL_TABLET | Freq: Four times a day (QID) | ORAL | 0 refills | Status: DC | PRN

## 2018-03-07 NOTE — Telephone Encounter (Signed)
1) Medication(s) Requested (by name):loperamide 2mg    2) Pharmacy of Choice: [facility]   3) Special Requests:   Approved medications will be sent to the pharmacy, we will reach out if there is an issue.  Requests made after 3pm may not be addressed until the following business day!  If a patient is unsure of the name of the medication(s) please note and ask patient to call back when they are able to provide all info, do not send to responsible party until all information is available!

## 2018-03-09 NOTE — Progress Notes (Signed)
Subjective: Sarah Garrison is a 37 y.o. y.o. female who presents today for preventative foot care  with  cc ingrown toenails b/l great toes. Pain is aggravated when wearing enclosed shoe gear. Pain is relieved with periodic professional debridement. Today, she relates her right great toe is more painful on the top and medial border. She denies any swelling, drainage or redness of either digit.  She relates her left 2nd digit capsulitis has completely resolved.  Objective: Vascular Examination: Capillary refill time <3 seconds x 10 digits Dorsalis pedis and Posterior tibial pulses present b/l No digital hair x 10 digits Skin temperature warm to cool b/l  Dermatological Examination: Skin with normal turgor, texture and tone b/l Toenails 1-5 b/l discolored, thick, dystrophic with subungual debris and pain with palpation to nailbeds due to thickness of nails. Bilateral great toes incruvated at medial/lateral nail borders with nail border hypertrophy. No erythema, no edema nor drainage. No digital warmth. Hyperkeratotic lesion   Musculoskeletal: Muscle strength 5/5 to all LE muscle groups  Neurological: Sensation intact with 10 gram monofilament.  Vibratory sensation intact  Assessment: 1. Painful onychomycosis toenails 1-5 b/l 2. Painful Ingrown toenail b/l great toes, noninfected 3.  NIDDM  Plan: 1. Continue diabetic foot care principles.  2. Toenails 1-5 b/l were debrided in length and girth without iatrogenic bleeding. Offending nail borders debrided and curretaged b/l great toes. Digits cleansed with alcohol. Triple antibiotic ointment applied. Symptoms noted to resolve post-treatment today. She is to continue to apply triple antibiotic ointment to both great toes once daily for one week. 3. Patient to continue soft, supportive shoe gear 4. Patient to report any pedal injuries to medical professional  5. Follow up 3 months.  6. Patient/POA to call should there be a concern in  the interim.

## 2018-03-10 ENCOUNTER — Other Ambulatory Visit: Payer: Self-pay

## 2018-03-11 ENCOUNTER — Ambulatory Visit: Payer: Medicaid Other | Admitting: Endocrinology

## 2018-03-11 DIAGNOSIS — Z0289 Encounter for other administrative examinations: Secondary | ICD-10-CM

## 2018-04-19 ENCOUNTER — Other Ambulatory Visit: Payer: Self-pay | Admitting: Family Medicine

## 2018-04-19 ENCOUNTER — Other Ambulatory Visit: Payer: Self-pay | Admitting: Nurse Practitioner

## 2018-04-19 ENCOUNTER — Other Ambulatory Visit: Payer: Self-pay

## 2018-04-19 DIAGNOSIS — K529 Noninfective gastroenteritis and colitis, unspecified: Secondary | ICD-10-CM

## 2018-04-21 ENCOUNTER — Other Ambulatory Visit: Payer: Self-pay | Admitting: Nurse Practitioner

## 2018-04-21 MED ORDER — NYSTATIN 100000 UNIT/GM EX POWD: Freq: Three times a day (TID) | CUTANEOUS | 0 refills | Status: DC

## 2018-04-21 MED ORDER — ALBUTEROL SULFATE HFA 108 (90 BASE) MCG/ACT IN AERS: 1.0000 | INHALATION_SPRAY | Freq: Four times a day (QID) | RESPIRATORY_TRACT | 0 refills | Status: DC | PRN

## 2018-04-21 MED ORDER — LOPERAMIDE HCL 2 MG PO TABS: 2.0000 mg | ORAL_TABLET | Freq: Four times a day (QID) | ORAL | 0 refills | Status: DC | PRN

## 2018-04-21 MED ORDER — INSULIN REGULAR HUMAN (CONC) 500 UNIT/ML ~~LOC~~ SOPN: PEN_INJECTOR | SUBCUTANEOUS | 0 refills | Status: DC

## 2018-04-21 NOTE — Telephone Encounter (Signed)
Refill request

## 2018-05-14 ENCOUNTER — Encounter: Payer: Self-pay | Admitting: Nurse Practitioner

## 2018-05-14 ENCOUNTER — Other Ambulatory Visit (HOSPITAL_COMMUNITY)
Admission: RE | Admit: 2018-05-14 | Discharge: 2018-05-14 | Disposition: A | Discharge status: Home / Self Care | Payer: Medicaid Other | Source: Ambulatory Visit | Attending: Nurse Practitioner | Admitting: Nurse Practitioner

## 2018-05-14 ENCOUNTER — Telehealth: Payer: Self-pay | Admitting: Nurse Practitioner

## 2018-05-14 ENCOUNTER — Ambulatory Visit (HOSPITAL_BASED_OUTPATIENT_CLINIC_OR_DEPARTMENT_OTHER): Payer: Medicaid Other | Admitting: Nurse Practitioner

## 2018-05-14 ENCOUNTER — Ambulatory Visit (HOSPITAL_COMMUNITY)
Admission: RE | Admit: 2018-05-14 | Discharge: 2018-05-14 | Disposition: A | Discharge status: Home / Self Care | Payer: Medicaid Other | Source: Ambulatory Visit | Attending: Nurse Practitioner | Admitting: Nurse Practitioner

## 2018-05-14 VITALS — BP 120/85 | HR 98 | Temp 97.7°F | Resp 16 | Wt 218.8 lb

## 2018-05-14 DIAGNOSIS — M5442 Lumbago with sciatica, left side: Secondary | ICD-10-CM | POA: Diagnosis not present

## 2018-05-14 DIAGNOSIS — N761 Subacute and chronic vaginitis: Secondary | ICD-10-CM | POA: Diagnosis not present

## 2018-05-14 DIAGNOSIS — M7989 Other specified soft tissue disorders: Secondary | ICD-10-CM

## 2018-05-14 DIAGNOSIS — G8929 Other chronic pain: Secondary | ICD-10-CM | POA: Diagnosis not present

## 2018-05-14 DIAGNOSIS — E1165 Type 2 diabetes mellitus with hyperglycemia: Secondary | ICD-10-CM

## 2018-05-14 DIAGNOSIS — K529 Noninfective gastroenteritis and colitis, unspecified: Secondary | ICD-10-CM

## 2018-05-14 DIAGNOSIS — K219 Gastro-esophageal reflux disease without esophagitis: Secondary | ICD-10-CM | POA: Diagnosis not present

## 2018-05-14 DIAGNOSIS — E113399 Type 2 diabetes mellitus with moderate nonproliferative diabetic retinopathy without macular edema, unspecified eye: Secondary | ICD-10-CM

## 2018-05-14 DIAGNOSIS — J4520 Mild intermittent asthma, uncomplicated: Secondary | ICD-10-CM

## 2018-05-14 DIAGNOSIS — M79604 Pain in right leg: Secondary | ICD-10-CM

## 2018-05-14 DIAGNOSIS — M79661 Pain in right lower leg: Secondary | ICD-10-CM

## 2018-05-14 MED ORDER — OMEPRAZOLE 20 MG PO CPDR: 20.0000 mg | DELAYED_RELEASE_CAPSULE | Freq: Every day | ORAL | 3 refills | Status: DC

## 2018-05-14 MED ORDER — LOPERAMIDE HCL 2 MG PO TABS: 2.0000 mg | ORAL_TABLET | Freq: Four times a day (QID) | ORAL | 0 refills | Status: DC | PRN

## 2018-05-14 MED ORDER — ALBUTEROL SULFATE HFA 108 (90 BASE) MCG/ACT IN AERS: 1.0000 | INHALATION_SPRAY | Freq: Four times a day (QID) | RESPIRATORY_TRACT | 0 refills | Status: DC | PRN

## 2018-05-14 MED ORDER — CYCLOBENZAPRINE HCL 10 MG PO TABS: 10.0000 mg | ORAL_TABLET | Freq: Three times a day (TID) | ORAL | 1 refills | Status: AC | PRN

## 2018-05-14 NOTE — Progress Notes (Signed)
Assessment & Plan:  Sarah Garrison was seen today for cyst.  Diagnoses and all orders for this visit:  Pain and swelling of lower leg, right -     VAS [place] LOWER EXTREMITY VENOUS (DVT); Future  Gastroesophageal reflux disease, esophagitis presence not specified -     omeprazole (PRILOSEC) 20 MG capsule; Take 1 capsule (20 mg total) by mouth daily. INSTRUCTIONS: Avoid GERD Triggers: acidic, spicy or fried foods, caffeine, coffee, sodas,  alcohol and chocolate.   Chronic vaginitis -     Urine cytology ancillary only -     Urine cytology ancillary only  Chronic bilateral low back pain with left-sided sciatica -     cyclobenzaprine (FLEXERIL) 10 MG tablet; Take 1 tablet (10 mg total) by mouth 3 (three) times daily as needed for muscle spasms. Work on losing weight to help reduce back pain. May alternate with heat and ice application for pain relief. May also alternate with acetaminophen and Ibuprofen as prescribed for back pain. Other alternatives include massage, acupuncture and water aerobics.  You must stay active and avoid a sedentary lifestyle.    Gastroenteritis, acute -     loperamide (IMODIUM A-D) 2 MG tablet; Take 1 tablet (2 mg total) by mouth 4 (four) times daily as needed for diarrhea or loose stools. 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 Advised patient to keep a fasting blood sugar log fast, 2 hours post lunch and bedtime   Continue blood sugar control as discussed in office today, low carbohydrate diet, and regular physical exercise as tolerated, 150 minutes per week (30 min each day, 5 days per week, or 50 min 3 days per week). Keep blood sugar logs with fasting goal of 90-130 mg/dl, post prandial (after you  eat) less than 180.  For Hypoglycemia: BS <60 and Hyperglycemia BS >400; contact the clinic ASAP. Annual eye exams and foot exams are recommended.   Mild intermittent asthma without complication -     albuterol (PROVENTIL HFA;VENTOLIN HFA) 108 (90 Base) MCG/ACT inhaler; Inhale 1-2 puffs into the lungs every 6 (six) hours as needed for wheezing or shortness of breath.    Patient has been counseled on age-appropriate routine health concerns for screening and prevention. These are reviewed and up-to-date. Referrals have been placed accordingly. Immunizations are up-to-date or declined.    Subjective:   Chief Complaint  Patient presents with  . Cyst    right leg   HPI Sarah Garrison 37 y.o. female presents to office today with complaints of poorly controlled GERD, vaginitis symptoms and RLE swelling. She has a history of intermittent diarrhea and constipation. This has been ongoing for several months and she does not feel this is related to her metformin. She was also evlauated in the ED last year for gastroenteritis/diarrhea.    Leg Swelling RLE swelling. Onset 1 week ago. She denies any injury or trauma to her leg. There is mild tenderness with pressure applied however she is able to bear weight.    GERD Chronic and not well controlled. She is requesting to switch from pantoprazole to omeprazole. She is not diet compliant in regard to eating foods that reduce symptoms of GERD. Morbidly Obese. She also reports she alternates  between diarrhea and constipation. Will refer to GI for evaluation of IBS/hiatal hernia.    Vaginitis Patient complains of an abnormal vaginal discharge for several days. Vaginal symptoms include local irritation and vulvar itching.Vulvar symptoms include local irritation.STI Risk: Possible STD exposureDischarge described as: copious, white and yellow.Other associated symptoms: none.Menstrual pattern: She had been bleeding regularly.   Chronic Low Back Pain She  has chronic low back pain. Ongoing for several months now. Medications tried include methocarbamol, ibuprofen, tizanidine and naproxen. She endorses left sided sciatica. She has not had any previous imaging of her lower back performed. Aggravating factors include prolonged sitting or standing, bending, twisting.   DM TYPE 2 A1c is poorly controlled. She has a long standing history of noncompliance. Lowest A1c has been 9. Medications include lantus 60 units and humulin 50units TID. She was seeing Dr. Lucianne Muss for her DM and was a no show at her last office appointment. I have instructed her that she needs to be compliant with her medications and diet as well as her follow up appointments with her endocrinologist. She is overdue for eye exam. She does not see a podiatrist.  Lab Results  Component Value Date   HGBA1C 13.0 (A) 02/10/2018   Hyperlipidemia Patient presents for follow up to hyperlipidemia.  She is not medication compliant. She is not diet compliant and denies  statin intolerance including myalgias. LDL is not at goal. She is supposed to take atorvastatin 20 mg daily.  Lab Results  Component Value Date   CHOL 300 (H) 03/03/2018   Lab Results  Component Value Date   HDL 46.30 03/03/2018   Lab Results  Component Value Date   LDLCALC 216 (H) 03/03/2018   Lab Results  Component Value Date   TRIG 187.0 (H) 03/03/2018   Lab Results  Component Value Date   CHOLHDL 6 03/03/2018   Review of Systems  Constitutional: Negative for fever, malaise/fatigue and weight loss.  HENT: Negative.  Negative for nosebleeds.   Eyes: Negative.  Negative for blurred vision, double vision and photophobia.  Respiratory: Negative.  Negative for cough and shortness of breath.   Cardiovascular: Positive for leg swelling. Negative for chest pain and palpitations.  Gastrointestinal: Positive for diarrhea and heartburn. Negative for abdominal pain, nausea and vomiting.  Genitourinary:       SEE HPI    Musculoskeletal: Positive for back pain and myalgias.  Neurological: Negative.  Negative for dizziness, focal weakness, seizures and headaches.  Psychiatric/Behavioral: Negative.  Negative for suicidal ideas.    Past Medical History:  Diagnosis Date  . Asthma   . Diabetes mellitus without complication (HCC)   . GERD (gastroesophageal reflux disease)   . Hypertension     Past Surgical History:  Procedure Laterality Date  . CESAREAN SECTION    . TUBAL LIGATION    . VENTRAL HERNIA REPAIR N/A 11/27/2017   Procedure: VENTRAL HERNIA REPAIR ERAS PATHWAY;  Surgeon: Harriette Bouillon, MD;  Location: [facility];  Service: General;  Laterality: N/A;  . WISDOM TOOTH EXTRACTION      Family History  Problem Relation Age of Onset  . Asthma Mother   . Cancer Mother        brain cancer  . Kidney failure Mother   . Asthma Father   . Diabetes Brother   . Diabetes Maternal Grandmother     Social History Reviewed with no changes to be made today.   Outpatient Medications Prior to Visit  Medication Sig Dispense Refill  .  ACCU-CHEK FASTCLIX LANCETS MISC Test 4 times daily 120 each 3  . atorvastatin (LIPITOR) 20 MG tablet Take 1 tablet (20 mg total) by mouth daily. 90 tablet 3  . doxycycline (VIBRAMYCIN) 100 MG capsule Take 1 capsule (100 mg total) by mouth 2 (two) times daily. 20 capsule 0  . glucose blood (ACCU-CHEK GUIDE) test strip Test 4 times daily. 100 each 12  . Insulin Pen Needle (ULTICARE PEN NEEDLES) 29G X 12.7MM MISC Use as instructed 100 each 11  . insulin regular human CONCENTRATED (HUMULIN R U-500 KWIKPEN) 500 UNIT/ML kwikpen 50 Units as measured on the pen, 30 minutes before each meal 2 pen 0  . LANTUS SOLOSTAR 100 UNIT/ML Solostar Pen 34 units twice daily (Patient taking differently: Inject 60 Units into the skin daily. ) 15 mL 2  . lisinopril (PRINIVIL,ZESTRIL) 20 MG tablet Take 1 tablet (20 mg total) by mouth daily. 90 tablet 3  . nystatin (NYSTATIN) powder Apply  topically 3 (three) times daily. 45 g 0  . pantoprazole (PROTONIX) 40 MG tablet Take 1 tablet (40 mg total) by mouth daily. 30 tablet 3  . albuterol (PROVENTIL HFA;VENTOLIN HFA) 108 (90 Base) MCG/ACT inhaler Inhale 1-2 puffs into the lungs every 6 (six) hours as needed for wheezing or shortness of breath. 1 Inhaler 0  . ciprofloxacin (CIPRO) 500 MG tablet Take 1 tablet (500 mg total) by mouth 2 (two) times daily. (Patient not taking: Reported on 05/14/2018) 20 tablet 0  . cyclobenzaprine (FLEXERIL) 10 MG tablet 1/2-1 up to 3 times daily prn muscle spasm (Patient taking differently: Take 10 mg by mouth 3 (three) times daily as needed for muscle spasms. ) 30 tablet 0  . loperamide (IMODIUM A-D) 2 MG tablet Take 1 tablet (2 mg total) by mouth 4 (four) times daily as needed for diarrhea or loose stools. 30 tablet 0   No facility-administered medications prior to visit.     Allergies  Allergen Reactions  . Morphine And Related Shortness Of Breath  . Glyburide Diarrhea and Other (See Comments)    Reaction:  Nose bleeds   . Ivp Dye [Iodinated Diagnostic Agents] Other (See Comments)    Shortness of breath.    . Oxycodone Other (See Comments)    Pt states that this medication makes her "dream about rabbits chasing" her.    . Vicodin [Hydrocodone-Acetaminophen] Other (See Comments)    Pt states that this medication makes her "dream about rabbits chasing" her.         Objective:    BP 120/85   Pulse 98   Temp 97.7 F (36.5 C) (Oral)   Resp 16   Wt 218 lb 12.8 oz (99.2 kg)   SpO2 97%   BMI 37.56 kg/m  Wt Readings from Last 3 Encounters:  05/14/18 218 lb 12.8 oz (99.2 kg)  03/04/18 212 lb 3.2 oz (96.3 kg)  02/10/18 217 lb (98.4 kg)    Physical Exam Vitals signs and nursing note reviewed.  Constitutional:      Appearance: She is well-developed.  HENT:     Head: Normocephalic and atraumatic.  Neck:     Musculoskeletal: Normal range of motion.  Cardiovascular:     Rate and Rhythm:  Normal rate and regular rhythm.     Heart sounds: Normal heart sounds. No murmur. No friction rub. No gallop.   Pulmonary:     Effort: Pulmonary effort is normal. No tachypnea or respiratory distress.     Breath sounds: Normal breath sounds. No  decreased breath sounds, wheezing, rhonchi or rales.  Chest:     Chest wall: No tenderness.  Abdominal:     General: Bowel sounds are normal.     Palpations: Abdomen is soft.     Tenderness: There is no abdominal tenderness. There is no right CVA tenderness, left CVA tenderness, guarding or rebound.  Musculoskeletal: Normal range of motion.     Lumbar back: She exhibits normal range of motion, no tenderness, no swelling and no edema.     Right lower leg: She exhibits tenderness.       Legs:  Skin:    General: Skin is warm and dry.  Neurological:     Mental Status: She is alert and oriented to person, place, and time.     Coordination: Coordination normal.  Psychiatric:        Behavior: Behavior normal. Behavior is cooperative.        Thought Content: Thought content normal.        Judgment: Judgment normal.          Patient has been counseled extensively about nutrition and exercise as well as the importance of adherence with medications and regular follow-up. The patient was given clear instructions to go to ER or return to medical center if symptoms don't improve, worsen or new problems develop. The patient verbalized understanding.   Follow-up: Return in about 3 months (around 08/13/2018) for FASTING labs and Physical.   Claiborne RiggZelda W Fleming, FNP-BC [facility] [place], [place] [phone]   05/25/2018, 9:57 PM

## 2018-05-14 NOTE — Progress Notes (Signed)
Pt is requesting to have protnix change. Pt is wanting to have omeprazole   [id number]

## 2018-05-14 NOTE — Patient Instructions (Signed)
Food Choices for Gastroesophageal Reflux Disease, Adult When you have gastroesophageal reflux disease (GERD), the foods you eat and your eating habits are very important. Choosing the right foods can help ease your discomfort. What guidelines do I need to follow?  Choose fruits, vegetables, whole grains, and low-fat dairy products.  Choose low-fat meat, fish, and poultry.  Limit fats such as oils, salad dressings, butter, nuts, and avocado.  Keep a food diary. This helps you identify foods that cause symptoms.  Avoid foods that cause symptoms. These may be different for everyone.  Eat small meals often instead of 3 large meals a day.  Eat your meals slowly, in a place where you are relaxed.  Limit fried foods.  Cook foods using methods other than frying.  Avoid drinking alcohol.  Avoid drinking large amounts of liquids with your meals.  Avoid bending over or lying down until 2-3 hours after eating. What foods are not recommended? These are some foods and drinks that may make your symptoms worse: Vegetables Tomatoes. Tomato juice. Tomato and spaghetti sauce. Chili peppers. Onion and garlic. Horseradish. Fruits Oranges, grapefruit, and lemon (fruit and juice). Meats High-fat meats, fish, and poultry. This includes hot dogs, ribs, ham, sausage, salami, and bacon. Dairy Whole milk and chocolate milk. Sour cream. Cream. Butter. Ice cream. Cream cheese. Drinks Coffee and tea. Bubbly (carbonated) drinks or energy drinks. Condiments Hot sauce. Barbecue sauce. Sweets/Desserts Chocolate and cocoa. Donuts. Peppermint and spearmint. Fats and Oils High-fat foods. This includes [place] fries and potato chips. Other Vinegar. Strong spices. This includes black pepper, white pepper, red pepper, cayenne, curry powder, cloves, ginger, and chili powder. The items listed above may not be a complete list of foods and drinks to avoid. Contact your dietitian for more information. This  information is not intended to replace advice given to you by your health care provider. Make sure you discuss any questions you have with your health care provider. Document Released: 11/20/2011 Document Revised: 10/27/2015 Document Reviewed: 03/25/2013 Elsevier Interactive Patient Education  2017 [employer] Inc[employer]  [place] Diet A bland diet consists of foods that do not have a lot of fat or fiber. Foods without fat or fiber are easier for the body to digest. They are also less likely to irritate your mouth, throat, stomach, and other parts of your gastrointestinal tract. A bland diet is sometimes called a BRAT diet. What is my plan? Your health care provider or dietitian may recommend specific changes to your diet to prevent and treat your symptoms, such as:  Eating small meals often.  Cooking food until it is soft enough to chew easily.  Chewing your food well.  Drinking fluids slowly.  Not eating foods that are very spicy, sour, or fatty.  Not eating citrus fruits, such as oranges and grapefruit.  What do I need to know about this diet?  Eat a variety of foods from the bland diet food list.  Do not follow a bland diet longer than you have to.  Ask your health care provider whether you should take vitamins. What foods can I eat? Grains  Hot cereals, such as cream of wheat. Bread, crackers, or tortillas made from refined white flour. Rice. Vegetables Canned or cooked vegetables. Mashed or boiled potatoes. Fruits Bananas. Applesauce. Other types of cooked or canned fruit with the skin and seeds removed, such as canned peaches or pears. Meats and Other Protein Sources Scrambled eggs. Creamy peanut butter or other nut butters. Lean, well-cooked meats, such as chicken or  fish. Tofu. Soups or broths. Dairy Low-fat dairy products, such as milk, cottage cheese, or yogurt. Beverages Water. Herbal tea. Apple juice. Sweets and Desserts Pudding. Custard. Fruit gelatin. Ice cream. Fats  and Oils Mild salad dressings. Canola or olive oil. The items listed above may not be a complete list of allowed foods or beverages. Contact your dietitian for more options. What foods are not recommended? Foods and ingredients that are often not recommended include:  Spicy foods, such as hot sauce or salsa.  Fried foods.  Sour foods, such as pickled or fermented foods.  Raw vegetables or fruits, especially citrus or berries.  Caffeinated drinks.  Alcohol.  Strongly flavored seasonings or condiments.  The items listed above may not be a complete list of foods and beverages that are not allowed. Contact your dietitian for more information. This information is not intended to replace advice given to you by your health care provider. Make sure you discuss any questions you have with your health care provider. Document Released: 09/12/2015 Document Revised: 10/27/2015 Document Reviewed: 06/02/2014 Elsevier Interactive Patient Education  2018 [employer].  [place]ke Simmers Diet A bland diet consists of foods that do not have a lot of fat or fiber. Foods without fat or fiber are easier for the body to digest. They are also less likely to irritate your mouth, throat, stomach, and other parts of your gastrointestinal tract. A bland diet is sometimes called a BRAT diet. What is my plan? Your health care provider or dietitian may recommend specific changes to your diet to prevent and treat your symptoms, such as:  Eating small meals often.  Cooking food until it is soft enough to chew easily.  Chewing your food well.  Drinking fluids slowly.  Not eating foods that are very spicy, sour, or fatty.  Not eating citrus fruits, such as oranges and grapefruit.  What do I need to know about this diet?  Eat a variety of foods from the bland diet food list.  Do not follow a bland diet longer than you have to.  Ask your health care provider whether you should take vitamins. What foods can I  eat? Grains  Hot cereals, such as cream of wheat. Bread, crackers, or tortillas made from refined white flour. Rice. Vegetables Canned or cooked vegetables. Mashed or boiled potatoes. Fruits Bananas. Applesauce. Other types of cooked or canned fruit with the skin and seeds removed, such as canned peaches or pears. Meats and Other Protein Sources Scrambled eggs. Creamy peanut butter or other nut butters. Lean, well-cooked meats, such as chicken or fish. Tofu. Soups or broths. Dairy Low-fat dairy products, such as milk, cottage cheese, or yogurt. Beverages Water. Herbal tea. Apple juice. Sweets and Desserts Pudding. Custard. Fruit gelatin. Ice cream. Fats and Oils Mild salad dressings. Canola or olive oil. The items listed above may not be a complete list of allowed foods or beverages. Contact your dietitian for more options. What foods are not recommended? Foods and ingredients that are often not recommended include:  Spicy foods, such as hot sauce or salsa.  Fried foods.  Sour foods, such as pickled or fermented foods.  Raw vegetables or fruits, especially citrus or berries.  Caffeinated drinks.  Alcohol.  Strongly flavored seasonings or condiments.  The items listed above may not be a complete list of foods and beverages that are not allowed. Contact your dietitian for more information. This information is not intended to replace advice given to you by your health care provider. Make sure  you discuss any questions you have with your health care provider. Document Released: 09/12/2015 Document Revised: 10/27/2015 Document Reviewed: 06/02/2014 Elsevier Interactive Patient Education  2018 [employer].  Parke Simmers Diet A bland diet consists of foods that do not have a lot of fat or fiber. Foods without fat or fiber are easier for the body to digest. They are also less likely to irritate your mouth, throat, stomach, and other parts of your gastrointestinal tract. A bland diet is  sometimes called a BRAT diet. What is my plan? Your health care provider or dietitian may recommend specific changes to your diet to prevent and treat your symptoms, such as:  Eating small meals often.  Cooking food until it is soft enough to chew easily.  Chewing your food well.  Drinking fluids slowly.  Not eating foods that are very spicy, sour, or fatty.  Not eating citrus fruits, such as oranges and grapefruit.  What do I need to know about this diet?  Eat a variety of foods from the bland diet food list.  Do not follow a bland diet longer than you have to.  Ask your health care provider whether you should take vitamins. What foods can I eat? Grains  Hot cereals, such as cream of wheat. Bread, crackers, or tortillas made from refined white flour. Rice. Vegetables Canned or cooked vegetables. Mashed or boiled potatoes. Fruits Bananas. Applesauce. Other types of cooked or canned fruit with the skin and seeds removed, such as canned peaches or pears. Meats and Other Protein Sources Scrambled eggs. Creamy peanut butter or other nut butters. Lean, well-cooked meats, such as chicken or fish. Tofu. Soups or broths. Dairy Low-fat dairy products, such as milk, cottage cheese, or yogurt. Beverages Water. Herbal tea. Apple juice. Sweets and Desserts Pudding. Custard. Fruit gelatin. Ice cream. Fats and Oils Mild salad dressings. Canola or olive oil. The items listed above may not be a complete list of allowed foods or beverages. Contact your dietitian for more options. What foods are not recommended? Foods and ingredients that are often not recommended include:  Spicy foods, such as hot sauce or salsa.  Fried foods.  Sour foods, such as pickled or fermented foods.  Raw vegetables or fruits, especially citrus or berries.  Caffeinated drinks.  Alcohol.  Strongly flavored seasonings or condiments.  The items listed above may not be a complete list of foods and  beverages that are not allowed. Contact your dietitian for more information. This information is not intended to replace advice given to you by your health care provider. Make sure you discuss any questions you have with your health care provider. Document Released: 09/12/2015 Document Revised: 10/27/2015 Document Reviewed: 06/02/2014 Elsevier Interactive Patient Education  2018 [employer].

## 2018-05-14 NOTE — Telephone Encounter (Signed)
Patient called because they said that their vision is blurry and is concerned on what it could be. She said that her eyes feel glossy.  [facility] if she feels like she needs to.

## 2018-05-14 NOTE — Progress Notes (Signed)
RLE venous duplex       has been completed. Preliminary results can be found under CV proc through chart review. Saad Buhl Eunice, RDMS, RVT   

## 2018-05-15 ENCOUNTER — Encounter: Payer: Self-pay | Admitting: Family Medicine

## 2018-05-15 ENCOUNTER — Encounter: Payer: Self-pay | Admitting: Gastroenterology

## 2018-05-15 ENCOUNTER — Ambulatory Visit: Payer: Medicaid Other | Attending: Family Medicine | Admitting: Family Medicine

## 2018-05-15 VITALS — BP 130/87 | HR 101 | Temp 98.0°F

## 2018-05-15 DIAGNOSIS — Z79899 Other long term (current) drug therapy: Secondary | ICD-10-CM | POA: Insufficient documentation

## 2018-05-15 DIAGNOSIS — K219 Gastro-esophageal reflux disease without esophagitis: Secondary | ICD-10-CM | POA: Diagnosis not present

## 2018-05-15 DIAGNOSIS — I10 Essential (primary) hypertension: Secondary | ICD-10-CM | POA: Insufficient documentation

## 2018-05-15 DIAGNOSIS — Z91041 Radiographic dye allergy status: Secondary | ICD-10-CM | POA: Insufficient documentation

## 2018-05-15 DIAGNOSIS — J111 Influenza due to unidentified influenza virus with other respiratory manifestations: Secondary | ICD-10-CM | POA: Insufficient documentation

## 2018-05-15 DIAGNOSIS — E119 Type 2 diabetes mellitus without complications: Secondary | ICD-10-CM | POA: Diagnosis not present

## 2018-05-15 DIAGNOSIS — J45909 Unspecified asthma, uncomplicated: Secondary | ICD-10-CM | POA: Insufficient documentation

## 2018-05-15 DIAGNOSIS — Z885 Allergy status to narcotic agent status: Secondary | ICD-10-CM | POA: Insufficient documentation

## 2018-05-15 DIAGNOSIS — E669 Obesity, unspecified: Secondary | ICD-10-CM | POA: Insufficient documentation

## 2018-05-15 DIAGNOSIS — Z888 Allergy status to other drugs, medicaments and biological substances status: Secondary | ICD-10-CM | POA: Diagnosis not present

## 2018-05-15 DIAGNOSIS — Z794 Long term (current) use of insulin: Secondary | ICD-10-CM | POA: Diagnosis not present

## 2018-05-15 DIAGNOSIS — Z6837 Body mass index (BMI) 37.0-37.9, adult: Secondary | ICD-10-CM | POA: Insufficient documentation

## 2018-05-15 LAB — URINE CYTOLOGY ANCILLARY ONLY
BACTERIAL VAGINITIS: NEGATIVE
CANDIDA VAGINITIS: POSITIVE — AB
CHLAMYDIA, DNA PROBE: NEGATIVE
Neisseria Gonorrhea: NEGATIVE
Trichomonas: NEGATIVE

## 2018-05-15 MED ORDER — OSELTAMIVIR PHOSPHATE 75 MG PO CAPS: 75.0000 mg | ORAL_CAPSULE | Freq: Two times a day (BID) | ORAL | 0 refills | Status: DC

## 2018-05-15 MED FILL — CYCLOBENZAPRINE 10 MG TAB: 10 | 10 days supply | Qty: 30 | Fill #0

## 2018-05-15 MED FILL — NYSTATIN 100000 UNIT/GM POW: 100000 | 30 days supply | Qty: 45 | Fill #0

## 2018-05-15 MED FILL — OMEPRAZOLE 20 MG CAP: 20 | 30 days supply | Qty: 30 | Fill #0

## 2018-05-15 MED FILL — PROAIR HFA 90 MCG INHALER: 108 (90 BAS | 25 days supply | Qty: 9 | Fill #0

## 2018-05-15 MED FILL — TAMIFLU 75 MG GELCAP: 75 | 5 days supply | Qty: 10 | Fill #0

## 2018-05-15 NOTE — Telephone Encounter (Signed)
Pt came in today for an OV. Was seen by Dr. Alvis LemmingsNewlin.

## 2018-05-15 NOTE — Patient Instructions (Signed)

## 2018-05-15 NOTE — Progress Notes (Signed)
Subjective:  Patient ID: Sarah Garrison, female    DOB: 1981/04/15  Age: 37 y.o. MRN: [id number]  CC: Sore Throat   HPI Sarah Garrison is a 37 year old female with a history of type 2 diabetes mellitus (A1c 13.0-followed by endocrinology, Dr. Lucianne MussKumar), obesity who presents today with a 24-hour history of sore throat, fever, myalgias which she describes as " like her body is about to break", cough, rhinorrhea, dyspnea. 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  Cough has been productive of whitish sputum. She was seen by her PCP yesterday at which time symptoms were mild but they have progressed over the last 24 hours and she has not used any OTC remedies.  Past Medical History:  Diagnosis Date  . Asthma   . Diabetes mellitus without complication (HCC)   . GERD (gastroesophageal reflux disease)   . Hypertension     Past Surgical History:  Procedure Laterality Date  . CESAREAN SECTION    . TUBAL LIGATION    . VENTRAL HERNIA REPAIR N/A 11/27/2017   Procedure: VENTRAL HERNIA REPAIR ERAS PATHWAY;  Surgeon: Harriette Bouillonornett, Thomas, MD;  Location: [facility];  Service: General;  Laterality: N/A;  . WISDOM TOOTH EXTRACTION      Allergies  Allergen Reactions  . Morphine And Related Shortness Of Breath  . Glyburide Diarrhea and Other (See Comments)    Reaction:  Nose bleeds   . Ivp Dye [Iodinated Diagnostic Agents] Other (See Comments)    Shortness of breath.    . Oxycodone Other (See Comments)    Pt states that this medication makes her "dream about rabbits chasing" her.    . Vicodin [Hydrocodone-Acetaminophen] Other (See Comments)    Pt states that this medication makes her "dream about rabbits chasing" her.       Outpatient Medications Prior to Visit  Medication Sig Dispense Refill  . ACCU-CHEK FASTCLIX LANCETS MISC Test 4 times daily 120 each 3  . albuterol (PROVENTIL HFA;VENTOLIN HFA) 108 (90 Base) MCG/ACT inhaler  Inhale 1-2 puffs into the lungs every 6 (six) hours as needed for wheezing or shortness of breath. 1 Inhaler 0  . atorvastatin (LIPITOR) 20 MG tablet Take 1 tablet (20 mg total) by mouth daily. 90 tablet 3  . cyclobenzaprine (FLEXERIL) 10 MG tablet Take 1 tablet (10 mg total) by mouth 3 (three) times daily as needed for muscle spasms. 30 tablet 1  . doxycycline (VIBRAMYCIN) 100 MG capsule Take 1 capsule (100 mg total) by mouth 2 (two) times daily. 20 capsule 0  . glucose blood (ACCU-CHEK GUIDE) test strip Test 4 times daily. 100 each 12  . Insulin Pen Needle (ULTICARE PEN NEEDLES) 29G X 12.7MM MISC Use as instructed 100 each 11  . insulin regular human CONCENTRATED (HUMULIN R U-500 KWIKPEN) 500 UNIT/ML kwikpen 50 Units as measured on the pen, 30 minutes before each meal 2 pen 0  . LANTUS SOLOSTAR 100 UNIT/ML Solostar Pen 34 units twice daily (Patient taking differently: Inject 60 Units into the skin daily. ) 15 mL 2  . lisinopril (PRINIVIL,ZESTRIL) 20 MG tablet Take 1 tablet (20 mg total) by mouth daily. 90 tablet 3  . loperamide (IMODIUM A-D) 2 MG tablet Take 1 tablet (2 mg total) by mouth 4 (four) times daily as needed for diarrhea or loose stools. 30 tablet 0  . nystatin (NYSTATIN) powder Apply topically 3 (three) times daily. 45 g 0  . omeprazole (PRILOSEC) 20  MG capsule Take 1 capsule (20 mg total) by mouth daily. 90 capsule 3  . pantoprazole (PROTONIX) 40 MG tablet Take 1 tablet (40 mg total) by mouth daily. 30 tablet 3   No facility-administered medications prior to visit.     ROS Review of Systems  Constitutional: Positive for activity change, appetite change, fatigue and fever.  HENT: Positive for sore throat. Negative for congestion and sinus pressure.   Eyes: Negative for visual disturbance.  Respiratory: Positive for cough and shortness of breath. Negative for chest tightness and wheezing.   Cardiovascular: Negative for chest pain and palpitations.  Gastrointestinal: Negative for  abdominal distention, abdominal pain and constipation.  Endocrine: Negative for polydipsia.  Genitourinary: Negative for dysuria and frequency.  Musculoskeletal: Positive for arthralgias and myalgias. Negative for back pain.  Skin: Negative for rash.  Neurological: Negative for tremors, light-headedness and numbness.  Hematological: Does not bruise/bleed easily.  Psychiatric/Behavioral: Negative for agitation and behavioral problems.    Objective:  BP 130/87   Pulse (!) 101   Temp 98 F (36.7 C) (Oral)   SpO2 99%   BP/Weight 05/15/2018 05/14/2018 03/04/2018  Systolic BP 130 120 136  Diastolic BP 87 85 77  Wt. (Lbs) - 218.8 212.2  BMI - 37.56 36.42      Physical Exam Constitutional:      Appearance: She is well-developed.     Comments: Acutely ill looking  HENT:     Right Ear: Tympanic membrane normal.     Left Ear: Tympanic membrane normal.     Mouth/Throat:     Pharynx: Posterior oropharyngeal erythema present.  Cardiovascular:     Rate and Rhythm: Normal rate.     Heart sounds: Normal heart sounds. No murmur.  Pulmonary:     Effort: Pulmonary effort is normal.     Breath sounds: Normal breath sounds. No wheezing or rales.  Chest:     Chest wall: No tenderness.  Abdominal:     General: Bowel sounds are normal. There is no distension.     Palpations: Abdomen is soft. There is no mass.     Tenderness: There is no abdominal tenderness.  Musculoskeletal: Normal range of motion.  Neurological:     Mental Status: She is alert and oriented to person, place, and time.  Psychiatric:        Mood and Affect: Mood normal.      Assessment & Plan:   1. Influenza Provided work note to be excused for today and tomorrow - Influenza A and B, RT PCR - Respiratory virus panel - oseltamivir (TAMIFLU) 75 MG capsule; Take 1 capsule (75 mg total) by mouth 2 (two) times daily.  Dispense: 10 capsule; Refill: 0   Meds ordered this encounter  Medications  . oseltamivir  (TAMIFLU) 75 MG capsule    Sig: Take 1 capsule (75 mg total) by mouth 2 (two) times daily.    Dispense:  10 capsule    Refill:  0    Follow-up: Return in about 3 months (around 08/14/2018) for follow up of chronic medical conditions, keep previously scheduled appointment.   Hoy Register MD

## 2018-05-16 IMAGING — CR DG CHEST 2V
2 series · 2 of 2 positions shown · non-contrast
Comparison: 06/11/2016

CLINICAL DATA: Body aches cough

EXAM:
CHEST  2 VIEW

[w chest pa]
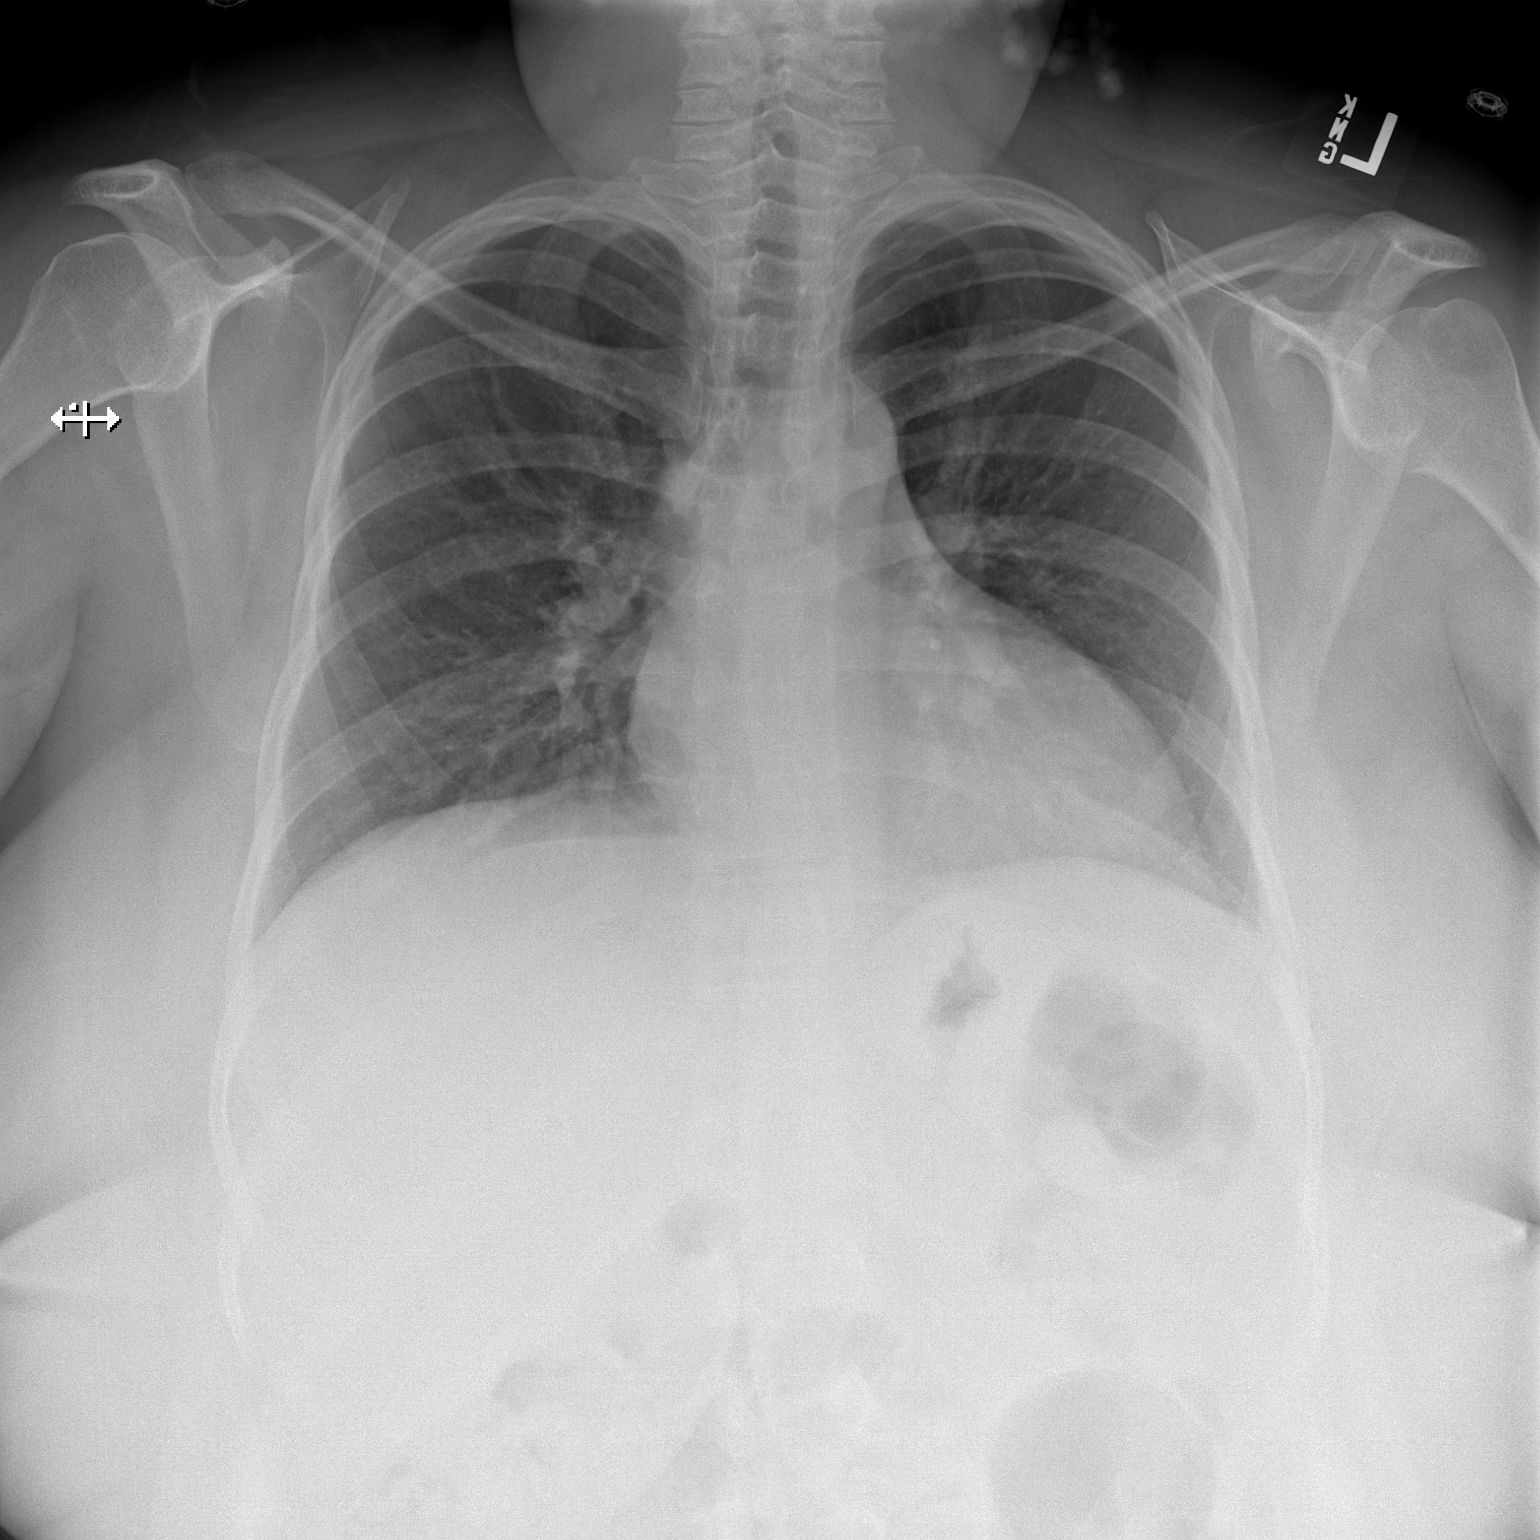

[w chest lat]
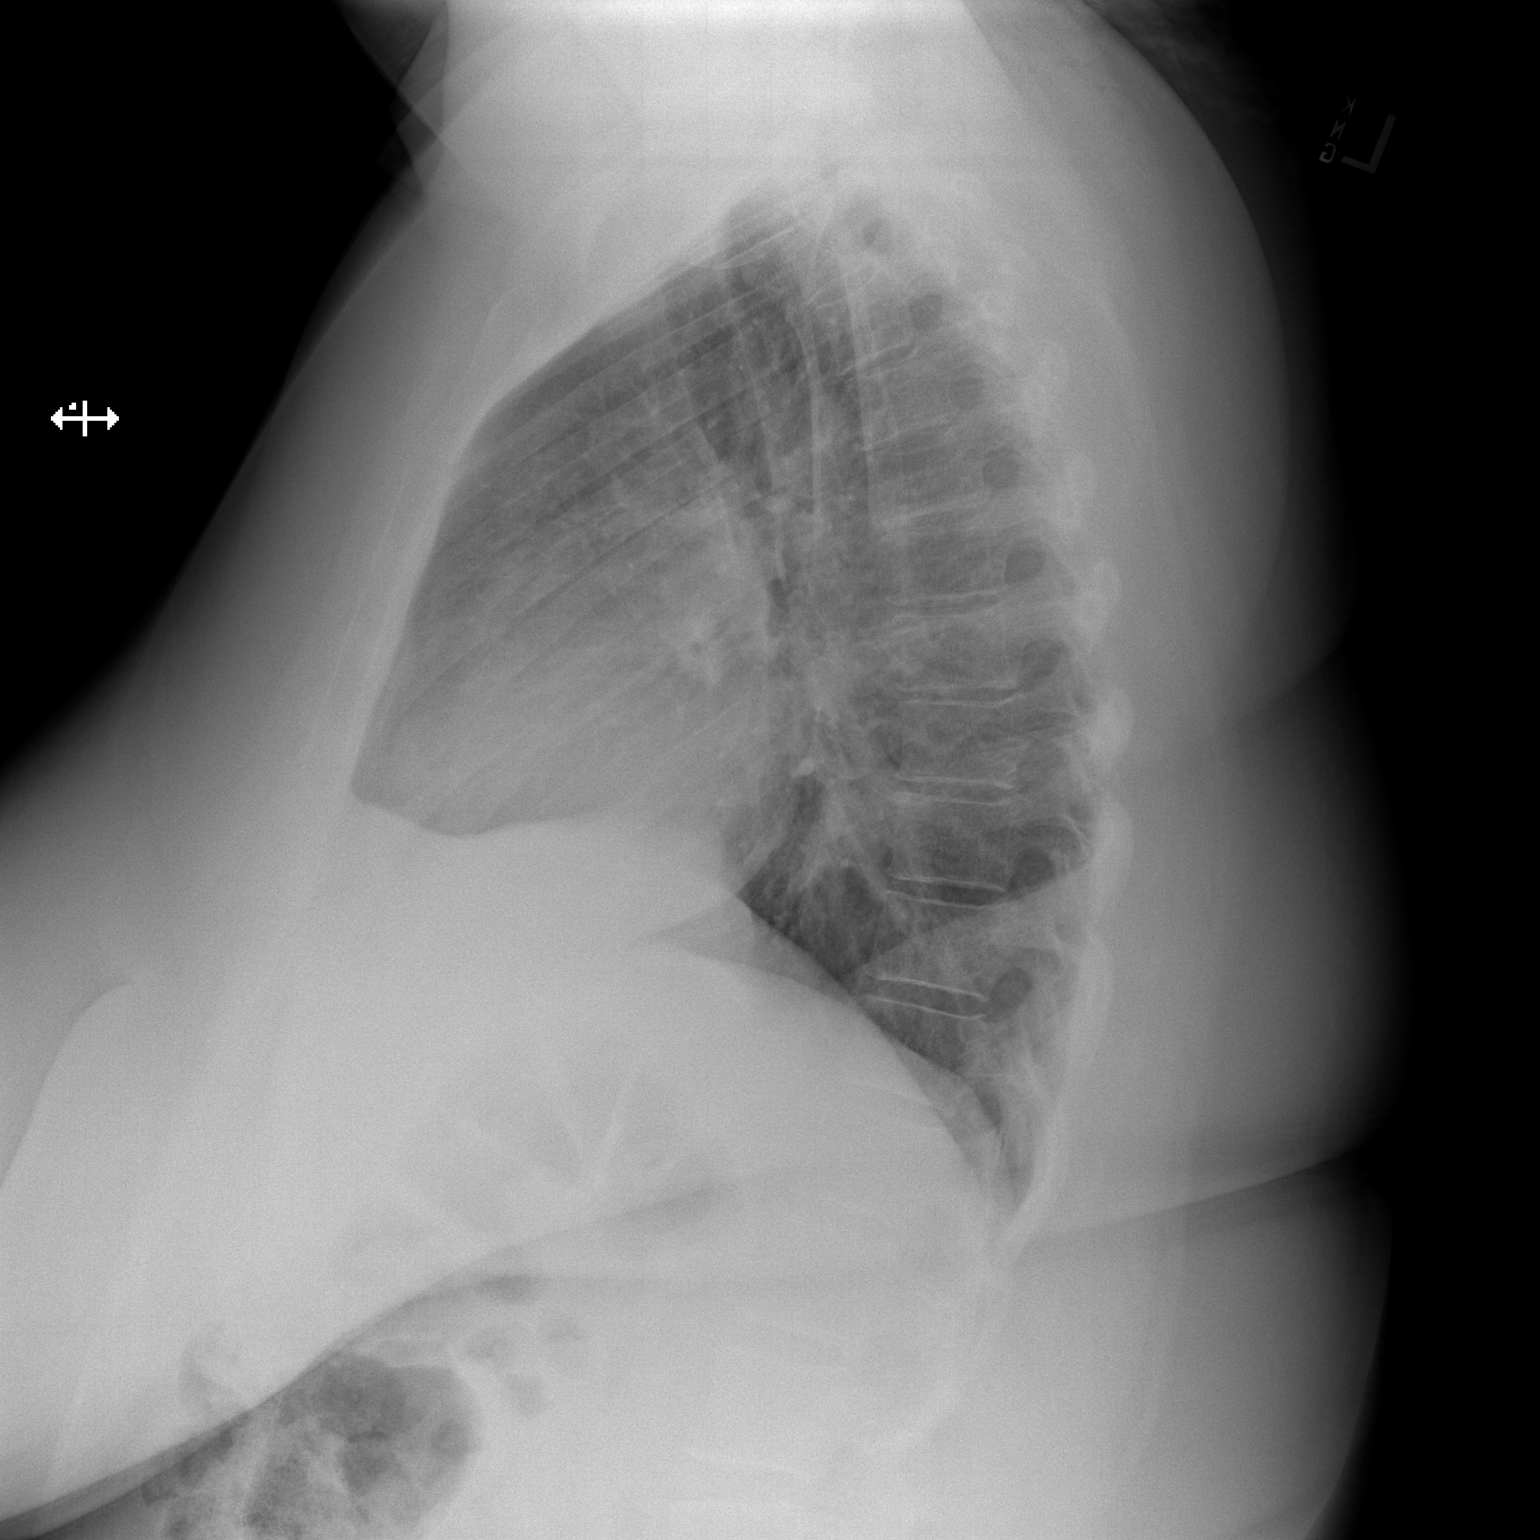

[2 of 2 positions shown; findings below may reference images not displayed]

FINDINGS: The heart size and mediastinal contours are within normal limits.
Both lungs are clear. The visualized skeletal structures are
unremarkable.
IMPRESSION: No active cardiopulmonary disease.

## 2018-05-18 LAB — RESPIRATORY VIRUS PANEL
ADENOVIRUS: NEGATIVE
INFLUENZA A: NEGATIVE
INFLUENZA B 1: NEGATIVE
METAPNEUMOVIRUS: NEGATIVE
PARAINFLUENZA 1 A: NEGATIVE
Parainfluenza 2: NEGATIVE
Parainfluenza 3: NEGATIVE
Respiratory Syncytial Virus A: NEGATIVE
Respiratory Syncytial Virus B: NEGATIVE
Rhinovirus: NEGATIVE

## 2018-05-18 LAB — INFLUENZA A AND B, RT PCR
INFLUENZA A PCR: NEGATIVE
INFLUENZA B PCR: NEGATIVE

## 2018-05-19 ENCOUNTER — Other Ambulatory Visit: Payer: Self-pay | Admitting: Nurse Practitioner

## 2018-05-19 MED ORDER — FLUCONAZOLE 150 MG PO TABS: 150.0000 mg | ORAL_TABLET | Freq: Once | ORAL | 0 refills | Status: AC

## 2018-05-19 MED ORDER — HYDROCORTISONE 1 % EX CREA: 1.0000 "application " | TOPICAL_CREAM | Freq: Two times a day (BID) | CUTANEOUS | 0 refills | Status: DC

## 2018-05-19 MED FILL — FLUCONAZOLE 150 MG TABS: 150 | 1 days supply | Qty: 1 | Fill #0

## 2018-05-20 ENCOUNTER — Ambulatory Visit: Payer: Medicaid Other | Attending: Family Medicine | Admitting: *Deleted

## 2018-05-20 VITALS — Temp 98.7°F

## 2018-05-20 DIAGNOSIS — Z3202 Encounter for pregnancy test, result negative: Secondary | ICD-10-CM

## 2018-05-20 DIAGNOSIS — Z3042 Encounter for surveillance of injectable contraceptive: Secondary | ICD-10-CM

## 2018-05-20 LAB — POCT URINE PREGNANCY: Preg Test, Ur: NEGATIVE

## 2018-05-20 MED ORDER — MEDROXYPROGESTERONE ACETATE 150 MG/ML IM SUSP
150.0000 mg | Freq: Once | INTRAMUSCULAR | Status: AC
  Administered 2018-05-20: 150 mg via INTRAMUSCULAR

## 2018-05-20 NOTE — Patient Instructions (Signed)
Patient made aware of dates to schedule the next injection. March 3-12th.

## 2018-05-20 NOTE — Progress Notes (Signed)
Patient tolerated injection well in the right dorsogluteal.

## 2018-05-25 ENCOUNTER — Encounter: Payer: Self-pay | Admitting: Nurse Practitioner

## 2018-05-29 ENCOUNTER — Ambulatory Visit: Payer: Medicaid Other

## 2018-05-30 ENCOUNTER — Ambulatory Visit: Payer: Medicaid Other | Admitting: Podiatry

## 2018-05-30 ENCOUNTER — Encounter: Payer: Self-pay | Admitting: Podiatry

## 2018-05-30 DIAGNOSIS — L03031 Cellulitis of right toe: Secondary | ICD-10-CM | POA: Diagnosis not present

## 2018-05-30 DIAGNOSIS — E1142 Type 2 diabetes mellitus with diabetic polyneuropathy: Secondary | ICD-10-CM

## 2018-05-30 NOTE — Progress Notes (Signed)
This patient presents the office with chief complaint of severe pain noted on the outside border of the right great toenail.  Patient is experiencing pain out of proportion.  She does appear to have a nail spicule growing on the outside border right great toenail.  There is no evidence of any redness swelling or drainage.  This patient is diabetic and diagnosed with diabetic neuropathy.  General Appearance  Alert, conversant and in no acute stress.  Vascular  Dorsalis pedis and posterior tibial  pulses are palpable  bilaterally.  Capillary return is within normal limits  bilaterally. Temperature is within normal limits  bilaterally.  Neurologic  Senn-Weinstein monofilament wire test within normal limits  bilaterally. Muscle power within normal limits bilaterally.  Nails Thick disfigured discolored nails with subungual debris  from hallux to fifth toes bilaterally.  Patient has a nail spicule growing into the distal aspect lateral border right great toenail.  No evidence of any infection noted.  Orthopedic  No limitations of motion  feet .  No crepitus or effusions noted.  No bony pathology or digital deformities noted.  Skin  normotropic skin with no porokeratosis noted bilaterally.  No signs of infections or ulcers noted.  Ingrown nail right hallux.  Nail spicule right hallux.  ROV.  Incision and drainage of the nail spicule lateral border right hallux under local anesthesia.  Neosporin dry sterile dressing was applied.  Patient was told to perform home soaking instructions.  Return to clinic as needed.   Helane GuntherGregory Jelisha Weed DPM

## 2018-06-12 ENCOUNTER — Ambulatory Visit: Payer: Medicaid Other | Admitting: Podiatry

## 2018-06-13 ENCOUNTER — Other Ambulatory Visit: Payer: Self-pay | Admitting: Internal Medicine

## 2018-06-13 ENCOUNTER — Other Ambulatory Visit: Payer: Self-pay | Admitting: Pharmacist

## 2018-06-13 ENCOUNTER — Telehealth: Payer: Self-pay | Admitting: Nurse Practitioner

## 2018-06-13 ENCOUNTER — Encounter: Payer: Self-pay | Admitting: Family Medicine

## 2018-06-13 ENCOUNTER — Ambulatory Visit: Payer: Medicaid Other | Attending: Nurse Practitioner | Admitting: Family Medicine

## 2018-06-13 ENCOUNTER — Other Ambulatory Visit (HOSPITAL_COMMUNITY)
Admission: RE | Admit: 2018-06-13 | Discharge: 2018-06-13 | Disposition: A | Discharge status: Home / Self Care | Payer: Medicaid Other | Source: Ambulatory Visit | Attending: Family Medicine | Admitting: Family Medicine

## 2018-06-13 VITALS — BP 134/88 | HR 70 | Temp 98.6°F | Resp 18

## 2018-06-13 DIAGNOSIS — E118 Type 2 diabetes mellitus with unspecified complications: Secondary | ICD-10-CM | POA: Insufficient documentation

## 2018-06-13 DIAGNOSIS — Z79899 Other long term (current) drug therapy: Secondary | ICD-10-CM | POA: Diagnosis not present

## 2018-06-13 DIAGNOSIS — E1165 Type 2 diabetes mellitus with hyperglycemia: Secondary | ICD-10-CM

## 2018-06-13 DIAGNOSIS — Z794 Long term (current) use of insulin: Secondary | ICD-10-CM | POA: Diagnosis not present

## 2018-06-13 DIAGNOSIS — IMO0002 Reserved for concepts with insufficient information to code with codable children: Secondary | ICD-10-CM

## 2018-06-13 DIAGNOSIS — Z91199 Patient's noncompliance with other medical treatment and regimen due to unspecified reason: Secondary | ICD-10-CM

## 2018-06-13 DIAGNOSIS — K219 Gastro-esophageal reflux disease without esophagitis: Secondary | ICD-10-CM | POA: Diagnosis not present

## 2018-06-13 DIAGNOSIS — I10 Essential (primary) hypertension: Secondary | ICD-10-CM | POA: Insufficient documentation

## 2018-06-13 DIAGNOSIS — Z9119 Patient's noncompliance with other medical treatment and regimen: Secondary | ICD-10-CM | POA: Diagnosis not present

## 2018-06-13 DIAGNOSIS — Z7901 Long term (current) use of anticoagulants: Secondary | ICD-10-CM | POA: Diagnosis not present

## 2018-06-13 DIAGNOSIS — B373 Candidiasis of vulva and vagina: Secondary | ICD-10-CM | POA: Diagnosis present

## 2018-06-13 DIAGNOSIS — J45909 Unspecified asthma, uncomplicated: Secondary | ICD-10-CM | POA: Diagnosis not present

## 2018-06-13 DIAGNOSIS — B3731 Acute candidiasis of vulva and vagina: Secondary | ICD-10-CM

## 2018-06-13 LAB — POCT URINALYSIS DIP (CLINITEK)
Bilirubin, UA: NEGATIVE
Blood, UA: NEGATIVE
Glucose, UA: 500 mg/dL — AB
Leukocytes, UA: NEGATIVE
Nitrite, UA: NEGATIVE
POC PROTEIN,UA: NEGATIVE
Spec Grav, UA: 1.01 (ref 1.010–1.025)
UROBILINOGEN UA: 0.2 U/dL
pH, UA: 6.5 (ref 5.0–8.0)

## 2018-06-13 LAB — GLUCOSE, POCT (MANUAL RESULT ENTRY)
POC Glucose: 361 mg/dl — AB (ref 70–99)
POC Glucose: 500 mg/dl — AB (ref 70–99)

## 2018-06-13 MED ORDER — FLUCONAZOLE 150 MG PO TABS: 150.0000 mg | ORAL_TABLET | Freq: Every day | ORAL | 0 refills | Status: DC

## 2018-06-13 MED ORDER — INSULIN PEN NEEDLE 32G X 4 MM MISC: 11 refills | Status: DC

## 2018-06-13 MED FILL — FLUCONAZOLE 150 MG TABS: 150 | 7 days supply | Qty: 7 | Fill #0

## 2018-06-13 MED FILL — TRUEPLUS PEN NDL 32GX5/32: 32G X 4 MM | 30 days supply | Qty: 100 | Fill #0

## 2018-06-13 MED FILL — TRUEPLUS PEN NDL 32GX5/32": 32G X 4 MM | 30 days supply | Qty: 100 | Fill #0

## 2018-06-13 NOTE — Progress Notes (Signed)
Subjective:  Patient ID: [name], female    DOB: [birth year]  Age: 38 y.o. MRN: [id number]  CC: Vaginitis   HPI Sarah Garrison is a 38 year old female with a history of type 2 diabetes mellitus (A1c 13.0-followed by endocrinology, Dr. Lucianne Garrison), obesity here with complaints of vaginal itching, whitish vaginal discharge which she describes as typical of a yeast infection which she has had frequently.  She currently has 1 sexual partner and states she uses protection all the time.  Denies dysuria, abdominal pain, nausea vomiting. Her blood sugar in the clinic is 500 and she missed a dose of Lantus last night and also did not take her insulin this morning.  She had 2 bars of chocolate on the way to the clinic.  Her son is also admitted in the hospital in this hospital and this has her stressed.  Past Medical History:  Diagnosis Date  . Asthma   . Diabetes mellitus without complication (HCC)   . GERD (gastroesophageal reflux disease)   . Hypertension     Past Surgical History:  Procedure Laterality Date  . CESAREAN SECTION    . TUBAL LIGATION    . VENTRAL HERNIA REPAIR N/A 11/27/2017   Procedure: VENTRAL HERNIA REPAIR ERAS PATHWAY;  Surgeon: Harriette Bouillon, MD;  Location: [facility];  Service: General;  Laterality: N/A;  . WISDOM TOOTH EXTRACTION      Allergies  Allergen Reactions  . Morphine And Related Shortness Of Breath  . Glyburide Diarrhea and Other (See Comments)    Reaction:  Nose bleeds   . Ivp Dye [Iodinated Diagnostic Agents] Other (See Comments)    Shortness of breath.    . Oxycodone Other (See Comments)    Pt states that this medication makes her "dream about rabbits chasing" her.    . Vicodin [Hydrocodone-Acetaminophen] Other (See Comments)    Pt states that this medication makes her "dream about rabbits chasing" her.       Outpatient Medications Prior to Visit  Medication Sig Dispense Refill  . ACCU-CHEK FASTCLIX LANCETS MISC Test 4  times daily 120 each 3  . albuterol (PROVENTIL HFA;VENTOLIN HFA) 108 (90 Base) MCG/ACT inhaler Inhale 1-2 puffs into the lungs every 6 (six) hours as needed for wheezing or shortness of breath. 1 Inhaler 0  . atorvastatin (LIPITOR) 20 MG tablet Take 1 tablet (20 mg total) by mouth daily. 90 tablet 3  . cyclobenzaprine (FLEXERIL) 10 MG tablet Take 1 tablet (10 mg total) by mouth 3 (three) times daily as needed for muscle spasms. 30 tablet 1  . doxycycline (VIBRAMYCIN) 100 MG capsule Take 1 capsule (100 mg total) by mouth 2 (two) times daily. 20 capsule 0  . glucose blood (ACCU-CHEK GUIDE) test strip Test 4 times daily. 100 each 12  . hydrocortisone cream (MONISTAT SOOTHING CARE ITCH) 1 % Apply 1 application topically 2 (two) times daily. 30 g 0  . Insulin Pen Needle (ULTICARE PEN NEEDLES) 29G X 12.7MM MISC Use as instructed 100 each 11  . insulin regular human CONCENTRATED (HUMULIN R U-500 KWIKPEN) 500 UNIT/ML kwikpen 50 Units as measured on the pen, 30 minutes before each meal 2 pen 0  . LANTUS SOLOSTAR 100 UNIT/ML Solostar Pen 34 units twice daily (Patient taking differently: Inject 60 Units into the skin daily. ) 15 mL 2  . lisinopril (PRINIVIL,ZESTRIL) 20 MG tablet Take 1 tablet (20 mg total) by mouth daily. 90 tablet 3  . loperamide (IMODIUM A-D) 2 MG tablet Take  1 tablet (2 mg total) by mouth 4 (four) times daily as needed for diarrhea or loose stools. 30 tablet 0  . nystatin (NYSTATIN) powder Apply topically 3 (three) times daily. 45 g 0  . omeprazole (PRILOSEC) 20 MG capsule Take 1 capsule (20 mg total) by mouth daily. 90 capsule 3  . oseltamivir (TAMIFLU) 75 MG capsule Take 1 capsule (75 mg total) by mouth 2 (two) times daily. 10 capsule 0  . pantoprazole (PROTONIX) 40 MG tablet Take 1 tablet (40 mg total) by mouth daily. 30 tablet 3   No facility-administered medications prior to visit.     ROS Review of Systems  Constitutional: Negative for activity change, appetite change and  fatigue.  HENT: Negative for congestion, sinus pressure and sore throat.   Eyes: Negative for visual disturbance.  Respiratory: Negative for cough, chest tightness, shortness of breath and wheezing.   Cardiovascular: Negative for chest pain and palpitations.  Gastrointestinal: Negative for abdominal distention, abdominal pain and constipation.  Endocrine: Negative for polydipsia.  Genitourinary: Positive for vaginal discharge. Negative for dysuria and frequency.  Musculoskeletal: Negative for arthralgias and back pain.  Skin: Negative for rash.  Neurological: Negative for tremors, light-headedness and numbness.  Hematological: Does not bruise/bleed easily.  Psychiatric/Behavioral: Negative for agitation and behavioral problems.    Objective:  BP 134/88 (BP Location: Left Arm, Patient Position: Sitting, Cuff Size: Large)   Pulse 70   Temp 98.6 F (37 C) (Oral)   Resp 18   SpO2 97%   BP/Weight 06/13/2018 05/15/2018 05/14/2018  Systolic BP 134 130 120  Diastolic BP 88 87 85  Wt. (Lbs) - - 218.8  BMI - - 37.56      Physical Exam Constitutional:      Appearance: She is well-developed.  Cardiovascular:     Rate and Rhythm: Normal rate.     Heart sounds: Normal heart sounds. No murmur.  Pulmonary:     Effort: Pulmonary effort is normal.     Breath sounds: Normal breath sounds. No wheezing or rales.  Chest:     Chest wall: No tenderness.  Abdominal:     General: Bowel sounds are normal. There is no distension.     Palpations: Abdomen is soft. There is no mass.     Tenderness: There is no abdominal tenderness.  Musculoskeletal: Normal range of motion.  Neurological:     Mental Status: She is alert and oriented to person, place, and time.     Lab Results  Component Value Date   HGBA1C 13.0 (A) 02/10/2018     Assessment & Plan:   1. Vaginal candidiasis Due to poor glycemic control Placed on 1 week course of Diflucan due to recurrence - Urine cytology ancillary  only  2. Uncontrolled type 2 diabetes mellitus with complication, with long-term current use of insulin (HCC) Uncontrolled with A1c of 13.0, CBG of 500 in the clinic Urine was positive for ketones She just had 2 bouts of chocolate Declines IV fluids but has her Humulin R with her and we have watched her administer 50 units as prescribed by her endocrinologist Keep appointment with endocrine next week - POCT URINALYSIS DIP (CLINITEK)  3. Non-adherence to medical treatment Nonchalant about poorly controlled diabetes mellitus even though we have discussed complications of diabetes mellitus   Meds ordered this encounter  Medications  . fluconazole (DIFLUCAN) 150 MG tablet    Sig: Take 1 tablet (150 mg total) by mouth daily.    Dispense:  7 tablet  Refill:  0    Follow-up: Return for Follow-up of chronic medical conditions, keep previously scheduled appointment with PCP.   Hoy RegisterEnobong Kiondra Caicedo MD

## 2018-06-13 NOTE — Telephone Encounter (Signed)
[facility] yeast infection at last appt. Pt request refill.  1) Medication(s) Requested (by name):DIFLUCAN  2) Pharmacy of Choice: [facility]  3) Special Requests:   Approved medications will be sent to the pharmacy, we will reach out if there is an issue.  Requests made after 3pm may not be addressed until the following business day!  If a patient is unsure of the name of the medication(s) please note and ask patient to call back when they are able to provide all info, do not send to responsible party until all information is available!

## 2018-06-13 NOTE — Progress Notes (Signed)
27

## 2018-06-13 NOTE — Telephone Encounter (Signed)
Pt saw provider earlier today. No further action required.

## 2018-06-16 ENCOUNTER — Ambulatory Visit: Payer: Medicaid Other | Admitting: Podiatry

## 2018-06-16 ENCOUNTER — Ambulatory Visit: Payer: Medicaid Other | Admitting: Gastroenterology

## 2018-06-16 LAB — URINE CYTOLOGY ANCILLARY ONLY
Chlamydia: NEGATIVE
Neisseria Gonorrhea: NEGATIVE
Trichomonas: NEGATIVE

## 2018-06-16 MED FILL — LANTUS SOLOSTAR 100 UNITS/M: 100 | 12 days supply | Qty: 15 | Fill #0

## 2018-06-16 MED FILL — NOVOLOG FLEXPEN SYRINGE: 100 | 30 days supply | Qty: 15 | Fill #0

## 2018-06-16 MED FILL — TRUEPLUS PEN NDL 32GX5/32: 32G X 4 MM | 25 days supply | Qty: 100 | Fill #0

## 2018-06-16 MED FILL — TRUEPLUS PEN NDL 32GX5/32": 32G X 4 MM | 25 days supply | Qty: 100 | Fill #0

## 2018-06-18 ENCOUNTER — Ambulatory Visit: Payer: Medicaid Other | Admitting: Gastroenterology

## 2018-06-18 ENCOUNTER — Encounter: Payer: Self-pay | Admitting: Gastroenterology

## 2018-06-18 VITALS — BP 114/80 | HR 100 | Ht 65.0 in | Wt 224.4 lb

## 2018-06-18 DIAGNOSIS — IMO0002 Reserved for concepts with insufficient information to code with codable children: Secondary | ICD-10-CM

## 2018-06-18 DIAGNOSIS — E118 Type 2 diabetes mellitus with unspecified complications: Secondary | ICD-10-CM | POA: Diagnosis not present

## 2018-06-18 DIAGNOSIS — E1165 Type 2 diabetes mellitus with hyperglycemia: Secondary | ICD-10-CM

## 2018-06-18 DIAGNOSIS — Z794 Long term (current) use of insulin: Secondary | ICD-10-CM

## 2018-06-18 DIAGNOSIS — R6881 Early satiety: Secondary | ICD-10-CM

## 2018-06-18 DIAGNOSIS — K219 Gastro-esophageal reflux disease without esophagitis: Secondary | ICD-10-CM | POA: Diagnosis not present

## 2018-06-18 DIAGNOSIS — R1115 Cyclical vomiting syndrome unrelated to migraine: Secondary | ICD-10-CM | POA: Diagnosis not present

## 2018-06-18 LAB — URINE CYTOLOGY ANCILLARY ONLY
Bacterial vaginitis: NEGATIVE
Candida vaginitis: POSITIVE — AB

## 2018-06-18 MED ORDER — OMEPRAZOLE 40 MG PO CPDR: 40.0000 mg | DELAYED_RELEASE_CAPSULE | Freq: Every day | ORAL | 3 refills | Status: DC

## 2018-06-18 MED ORDER — METOCLOPRAMIDE HCL 5 MG PO TABS: 5.0000 mg | ORAL_TABLET | Freq: Two times a day (BID) | ORAL | 1 refills | Status: DC

## 2018-06-18 MED FILL — METOCLOPRAMIDE 5 MG TABLET: 5 | 30 days supply | Qty: 60 | Fill #0

## 2018-06-18 MED FILL — OMEPRAZOLE DR 40 MG CAPSULE: 40 | 30 days supply | Qty: 30 | Fill #0

## 2018-06-18 NOTE — Patient Instructions (Addendum)
If you are age 37 or older, your body mass index should be between 23-30. Your Body mass index is 37.34 kg/m. If this is out of the aforementioned range listed, please consider follow up with your Primary Care Provider.  If you are age 50 or younger, your body mass index should be between 19-25. Your Body mass index is 37.34 kg/m. If this is out of the aformentioned range listed, please consider follow up with your Primary Care Provider.   Increase Omeprazole to 40mg  once daily.  Take Reglan 5mg  one at breakfast and one at dinner.   You have been scheduled for a follow up in 6 weeks with Dr. Myrtie Neither   Gastroesophageal Reflux Disease, Adult Gastroesophageal reflux (GER) happens when acid from the stomach flows up into the tube that connects the mouth and the stomach (esophagus). Normally, food travels down the esophagus and stays in the stomach to be digested. However, when a person has GER, food and stomach acid sometimes move back up into the esophagus. If this becomes a more serious problem, the person may be diagnosed with a disease called gastroesophageal reflux disease (GERD). GERD occurs when the reflux:  Happens often.  Causes frequent or severe symptoms.  Causes problems such as damage to the esophagus. When stomach acid comes in contact with the esophagus, the acid may cause soreness (inflammation) in the esophagus. Over time, GERD may create small holes (ulcers) in the lining of the esophagus. What are the causes? This condition is caused by a problem with the muscle between the esophagus and the stomach (lower esophageal sphincter, or LES). Normally, the LES muscle closes after food passes through the esophagus to the stomach. When the LES is weakened or abnormal, it does not close properly, and that allows food and stomach acid to go back up into the esophagus. The LES can be weakened by certain dietary substances, medicines, and medical conditions, including:  Tobacco  use.  Pregnancy.  Having a hiatal hernia.  Alcohol use.  Certain foods and beverages, such as coffee, chocolate, onions, and peppermint. What increases the risk? You are more likely to develop this condition if you:  Have an increased body weight.  Have a connective tissue disorder.  Use NSAID medicines. What are the signs or symptoms? Symptoms of this condition include:  Heartburn.  Difficult or painful swallowing.  The feeling of having a lump in the throat.  Abitter taste in the mouth.  Bad breath.  Having a large amount of saliva.  Having an upset or bloated stomach.  Belching.  Chest pain. Different conditions can cause chest pain. Make sure you see your health care provider if you experience chest pain.  Shortness of breath or wheezing.  Ongoing (chronic) cough or a night-time cough.  Wearing away of tooth enamel.  Weight loss. How is this diagnosed? Your health care provider will take a medical history and perform a physical exam. To determine if you have mild or severe GERD, your health care provider may also monitor how you respond to treatment. You may also have tests, including:  A test to examine your stomach and esophagus with a small camera (endoscopy).  A test thatmeasures the acidity level in your esophagus.  A test thatmeasures how much pressure is on your esophagus.  A barium swallow or modified barium swallow test to show the shape, size, and functioning of your esophagus. How is this treated? The goal of treatment is to help relieve your symptoms and to prevent complications.  Treatment for this condition may vary depending on how severe your symptoms are. Your health care provider may recommend:  Changes to your diet.  Medicine.  Surgery. Follow these instructions at home: Eating and drinking   Follow a diet as recommended by your health care provider. This may involve avoiding foods and drinks such as: ? Coffee and tea (with  or without caffeine). ? Drinks that containalcohol. ? Energy drinks and sports drinks. ? Carbonated drinks or sodas. ? Chocolate and cocoa. ? Peppermint and mint flavorings. ? Garlic and onions. ? Horseradish. ? Spicy and acidic foods, including peppers, chili powder, curry powder, vinegar, hot sauces, and barbecue sauce. ? Citrus fruit juices and citrus fruits, such as oranges, lemons, and limes. ? Tomato-based foods, such as red sauce, chili, salsa, and pizza with red sauce. ? Fried and fatty foods, such as donuts, french fries, potato chips, and high-fat dressings. ? High-fat meats, such as hot dogs and fatty cuts of red and white meats, such as rib eye steak, sausage, ham, and bacon. ? High-fat dairy items, such as whole milk, butter, and cream cheese.  Eat small, frequent meals instead of large meals.  Avoid drinking large amounts of liquid with your meals.  Avoid eating meals during the 2-3 hours before bedtime.  Avoid lying down right after you eat.  Do not exercise right after you eat. Lifestyle   Do not use any products that contain nicotine or tobacco, such as cigarettes, e-cigarettes, and chewing tobacco. If you need help quitting, ask your health care provider.  Try to reduce your stress by using methods such as yoga or meditation. If you need help reducing stress, ask your health care provider.  If you are overweight, reduce your weight to an amount that is healthy for you. Ask your health care provider for guidance about a safe weight loss goal. General instructions  Pay attention to any changes in your symptoms.  Take over-the-counter and prescription medicines only as told by your health care provider. Do not take aspirin, ibuprofen, or other NSAIDs unless your health care provider told you to do so.  Wear loose-fitting clothing. Do not wear anything tight around your waist that causes pressure on your abdomen.  Raise (elevate) the head of your bed about 6  inches (15 cm).  Avoid bending over if this makes your symptoms worse.  Keep all follow-up visits as told by your health care provider. This is important. Contact a health care provider if:  You have: ? New symptoms. ? Unexplained weight loss. ? Difficulty swallowing or it hurts to swallow. ? Wheezing or a persistent cough. ? A hoarse voice.  Your symptoms do not improve with treatment. Get help right away if you:  Have pain in your arms, neck, jaw, teeth, or back.  Feel sweaty, dizzy, or light-headed.  Have chest pain or shortness of breath.  Vomit and your vomit looks like blood or coffee grounds.  Faint.  Have stool that is bloody or black.  Cannot swallow, drink, or eat. Summary  Gastroesophageal reflux happens when acid from the stomach flows up into the esophagus. GERD is a disease in which the reflux happens often, causes frequent or severe symptoms, or causes problems such as damage to the esophagus.  Treatment for this condition may vary depending on how severe your symptoms are. Your health care provider may recommend diet and lifestyle changes, medicine, or surgery.  Contact a health care provider if you have new or worsening symptoms.  Take over-the-counter and prescription medicines only as told by your health care provider. Do not take aspirin, ibuprofen, or other NSAIDs unless your health care provider told you to do so.  Keep all follow-up visits as told by your health care provider. This is important. This information is not intended to replace advice given to you by your health care provider. Make sure you discuss any questions you have with your health care provider. Document Released: 02/28/2005 Document Revised: 11/27/2017 Document Reviewed: 11/27/2017 Elsevier Interactive Patient Education  2019 [employer]  Food Choices for Gastroesophageal Reflux Disease, Adult When you have gastroesophageal reflux disease (GERD), the foods you eat and your  eating habits are very important. Choosing the right foods can help ease your discomfort. Think about working with a nutrition specialist (dietitian) to help you make good choices. What are tips for following this plan?  Meals  Choose healthy foods that are low in fat, such as fruits, vegetables, whole grains, low-fat dairy products, and lean meat, fish, and poultry.  Eat small meals often instead of 3 large meals a day. Eat your meals slowly, and in a place where you are relaxed. Avoid bending over or lying down until 2-3 hours after eating.  Avoid eating meals 2-3 hours before bed.  Avoid drinking a lot of liquid with meals.  Cook foods using methods other than frying. Bake, grill, or broil food instead.  Avoid or limit: ? Chocolate. ? Peppermint or spearmint. ? Alcohol. ? Pepper. ? Black and decaffeinated coffee. ? Black and decaffeinated tea. ? Bubbly (carbonated) soft drinks. ? Caffeinated energy drinks and soft drinks.  Limit high-fat foods such as: ? Fatty meat or fried foods. ? Whole milk, cream, butter, or ice cream. ? Nuts and nut butters. ? Pastries, donuts, and sweets made with butter or shortening.  Avoid foods that cause symptoms. These foods may be different for everyone. Common foods that cause symptoms include: ? Tomatoes. ? Oranges, lemons, and limes. ? Peppers. ? Spicy food. ? Onions and garlic. ? Vinegar. Lifestyle  Maintain a healthy weight. Ask your doctor what weight is healthy for you. If you need to lose weight, work with your doctor to do so safely.  Exercise for at least 30 minutes for 5 or more days each week, or as told by your doctor.  Wear loose-fitting clothes.  Do not smoke. If you need help quitting, ask your doctor.  Sleep with the head of your bed higher than your feet. Use a wedge under the mattress or blocks under the bed frame to raise the head of the bed. Summary  When you have gastroesophageal reflux disease (GERD), food and  lifestyle choices are very important in easing your symptoms.  Eat small meals often instead of 3 large meals a day. Eat your meals slowly, and in a place where you are relaxed.  Limit high-fat foods such as fatty meat or fried foods.  Avoid bending over or lying down until 2-3 hours after eating.  Avoid peppermint and spearmint, caffeine, alcohol, and chocolate. This information is not intended to replace advice given to you by your health care provider. Make sure you discuss any questions you have with your health care provider. Document Released: 11/20/2011 Document Revised: 06/26/2016 Document Reviewed: 06/26/2016 Elsevier Interactive Patient Education  2019 [employer]  Thank you for choosing me and [facility].

## 2018-06-18 NOTE — Progress Notes (Signed)
[facility] Consult Note:  History: Sarah Garrison 06/18/2018  Referring physician: Claiborne Rigg, NP  Reason for consult/chief complaint: Gastroesophageal Reflux (x 11 years, can't sleep at night); Emesis (burning and coming out of nose); throat burning; Chest Pain; and Constipation (alternating with diarrhea)   Subjective  HPI:  [name] was referred by primary care for many years of uncontrolled reflux.  She reports frequent heartburn and regurgitation with sore throat.  Overnight and early morning she may have severe regurgitation or comes out the mouth and nose.  She recalls being on medication for this after 1 of her pregnancies.  There is early satiety, even just drinking water, no anorexia or weight loss.  She was just seen in endocrinology consult for poorly controlled diabetes, though I am unable to view that entire encounter note by a [facility] provider.  It looks like he can change was made in insulin regimen, discontinuing U500 insulin going to Lantus regimen.  There are multiple POC glucose levels over 400 in our system, and recent hemoglobin A1c = 13 in September 2019.   ROS:  Review of Systems  Constitutional: Positive for fatigue. Negative for appetite change and unexpected weight change.  HENT: Negative for mouth sores and voice change.   Eyes: Negative for pain and redness.  Respiratory: Positive for shortness of breath. Negative for cough.   Cardiovascular: Negative for chest pain and palpitations.  Genitourinary: Negative for dysuria and hematuria.  Musculoskeletal: Negative for arthralgias and myalgias.  Skin: Negative for pallor and rash.  Neurological: Negative for weakness and headaches.  Hematological: Negative for adenopathy.     Past Medical History: Past Medical History:  Diagnosis Date  . Asthma   . Diabetes mellitus without complication (HCC)   . GERD (gastroesophageal reflux disease)   . Hypertension      Past  Surgical History: Past Surgical History:  Procedure Laterality Date  . CESAREAN SECTION    . TUBAL LIGATION    . VENTRAL HERNIA REPAIR N/A 11/27/2017   Procedure: VENTRAL HERNIA REPAIR ERAS PATHWAY;  Surgeon: Harriette Bouillon, MD;  Location: [facility];  Service: General;  Laterality: N/A;  . WISDOM TOOTH EXTRACTION       Family History: Family History  Problem Relation Age of Onset  . Asthma Mother   . Kidney failure Mother   . Brain cancer Mother   . Asthma Father   . Other Father        surgery on stomach but don't know from what  . Diabetes Brother   . Diabetes Maternal Grandmother     Social History: Social History   Socioeconomic History  . Marital status: Single    Spouse name: Not on file  . Number of children: 4  . Years of education: Not on file  . Highest education level: Not on file  Occupational History  . Not on file  Social Needs  . Financial resource strain: Not on file  . Food insecurity:    Worry: Not on file    Inability: Not on file  . Transportation needs:    Medical: Not on file    Non-medical: Not on file  Tobacco Use  . Smoking status: Former Smoker    Packs/day: 0.10    Types: Cigarettes    Last attempt to quit: 11/27/2017    Years since quitting: 0.5  . Smokeless tobacco: Never Used  Substance and Sexual Activity  . Alcohol use: No  . Drug use: No  .  Sexual activity: Not Currently    Birth control/protection: Surgical  Lifestyle  . Physical activity:    Days per week: Not on file    Minutes per session: Not on file  . Stress: Not on file  Relationships  . Social connections:    Talks on phone: Not on file    Gets together: Not on file    Attends religious service: Not on file    Active member of club or organization: Not on file    Attends meetings of clubs or organizations: Not on file    Relationship status: Not on file  Other Topics Concern  . Not on file  Social History Narrative  . Not on file     Allergies: Allergies  Allergen Reactions  . Morphine And Related Shortness Of Breath  . Glyburide Diarrhea and Other (See Comments)    Reaction:  Nose bleeds   . Ivp Dye [Iodinated Diagnostic Agents] Other (See Comments)    Shortness of breath.    . Oxycodone Other (See Comments)    Pt states that this medication makes her "dream about rabbits chasing" her.    . Vicodin [Hydrocodone-Acetaminophen] Other (See Comments)    Pt states that this medication makes her "dream about rabbits chasing" her.      Outpatient Meds: Current Outpatient Medications  Medication Sig Dispense Refill  . ACCU-CHEK FASTCLIX LANCETS MISC Test 4 times daily 120 each 3  . albuterol (PROVENTIL HFA;VENTOLIN HFA) 108 (90 Base) MCG/ACT inhaler Inhale 1-2 puffs into the lungs every 6 (six) hours as needed for wheezing or shortness of breath. 1 Inhaler 0  . atorvastatin (LIPITOR) 20 MG tablet Take 1 tablet (20 mg total) by mouth daily. 90 tablet 3  . calcium carbonate (TUMS) 500 MG chewable tablet Chew 2-3 tablets by mouth as directed.    . fluconazole (DIFLUCAN) 150 MG tablet Take 1 tablet (150 mg total) by mouth daily. 7 tablet 0  . glucose blood (ACCU-CHEK GUIDE) test strip Test 4 times daily. 100 each 12  . hydrocortisone cream (MONISTAT SOOTHING CARE ITCH) 1 % Apply 1 application topically 2 (two) times daily. 30 g 0  . insulin aspart (NOVOLOG FLEXPEN) 100 UNIT/ML FlexPen Inject 10 Units into the skin 3 (three) times daily.    Marland Kitchen. LANTUS SOLOSTAR 100 UNIT/ML Solostar Pen 34 units twice daily (Patient taking differently: Inject 60 Units into the skin daily. ) 15 mL 2  . lisinopril (PRINIVIL,ZESTRIL) 20 MG tablet Take 1 tablet (20 mg total) by mouth daily. 90 tablet 3  . loperamide (IMODIUM A-D) 2 MG tablet Take 1 tablet (2 mg total) by mouth 4 (four) times daily as needed for diarrhea or loose stools. 30 tablet 0  . nystatin (NYSTATIN) powder Apply topically 3 (three) times daily. 45 g 0  . pantoprazole  (PROTONIX) 40 MG tablet Take 40 mg by mouth 2 (two) times daily before a meal.    . TRUEPLUS PEN NEEDLES 32G X 4 MM MISC USE 3 TIMES DAILY AS DIRECTED 100 each 5  . metoCLOPramide (REGLAN) 5 MG tablet Take 1 tablet (5 mg total) by mouth 2 (two) times daily. Take at breakfast and bedtime 60 tablet 1  . omeprazole (PRILOSEC) 40 MG capsule Take 1 capsule (40 mg total) by mouth daily. 90 capsule 3   No current facility-administered medications for this visit.       ___________________________________________________________________ Objective   Exam:  BP 114/80 (BP Location: Left Arm, Patient Position: Sitting, Cuff Size: Normal)  Pulse 100   Ht 5\' 5"  (1.651 m) Comment: height measured without shoes  Wt 224 lb 6 oz (101.8 kg)   BMI 37.34 kg/m    General: Not acutely ill-appearing  Eyes: sclera anicteric, no redness  ENT: oral mucosa moist without lesions, no cervical or supraclavicular lymphadenopathy  CV: RRR without murmur, S1/S2, no JVD, no peripheral edema  Resp: clear to auscultation bilaterally, normal RR and effort noted  GI: soft, no tenderness, with active bowel sounds. No guarding or palpable organomegaly noted, limited by body habitus  Skin; warm and dry, no rash or jaundice noted  Neuro: awake, alert and oriented x 3. Normal gross motor function and fluent speech  Labs:  CMP Latest Ref Rng & Units 03/03/2018 02/16/2018 11/21/2017  Glucose 70 - 99 mg/dL 161(W) 960(A) 540(J)  BUN 6 - 23 mg/dL 13 12 9   Creatinine 0.40 - 1.20 mg/dL 8.11 9.14 7.82  Sodium 135 - 145 mEq/L 132(L) 139 136  Potassium 3.5 - 5.1 mEq/L 3.8 4.5 4.7  Chloride 96 - 112 mEq/L 100 102 105  CO2 19 - 32 mEq/L 24 27 23   Calcium 8.4 - 10.5 mg/dL 9.1 9.7 9.6  Total Protein 6.5 - 8.1 g/dL - 7.7 6.6  Total Bilirubin 0.3 - 1.2 mg/dL - 0.9 0.6  Alkaline Phos 38 - 126 U/L - 148(H) 138(H)  AST 15 - 41 U/L - 16 12(L)  ALT 0 - 44 U/L - 21 15   CBC Latest Ref Rng & Units 02/16/2018 11/21/2017 10/26/2017   WBC 4.0 - 10.5 K/uL 8.3 8.8 9.1  Hemoglobin 12.0 - 15.0 g/dL 16.3(H) 15.5(H) 14.7  Hematocrit 36.0 - 46.0 % 46.2(H) 46.4(H) 43.3  Platelets 150 - 400 K/uL 346 288 298    CT abdomen and pelvis July 2018 for mid abdominal pain showing small umbilical hernia.  Assessment: Encounter Diagnoses  Name Primary?  . Gastroesophageal reflux disease, esophagitis presence not specified Yes  . Uncontrolled type 2 diabetes mellitus with complication, with long-term current use of insulin (HCC)   . Non-intractable cyclical vomiting with nausea   . Early satiety     This patient has longstanding reflux based on her description, clearly exacerbated by poorly controlled diabetes affecting upper GI motility. She does not feel that acid reducing medicines have been very helpful, and I explained their limitations.  She needs significant improvement in diabetic control, weight loss, diet and lifestyle changes for long-term improvement in reflux. Plan:  She had a small supply of Protonix leftover but asked primary care to prescribe omeprazole instead.  They did so, but gave her 20 mg daily dose, so I changed it to 40 mg.  I also gave her metoclopramide 5 mg in the morning and at bedtime.  I cautioned her against the potential side effects of this including, but not limited to count of tardive dyskinesia.  I explained and describe what that might look like if it should occur.  If it does, she should stop the medicine immediately and let us know.  I think a short trial at low-dose is reasonable to reduce reflux symptoms and possibly help control glucose as well if stomach emptying improves.  She will return to see me in about 6 weeks.  Thank you for the courtesy of this consult.  Please call me with any questions or concerns.  Charlie Pitter III  CC: Referring provider noted above

## 2018-06-19 ENCOUNTER — Ambulatory Visit: Payer: Medicaid Other | Admitting: Podiatry

## 2018-06-22 DIAGNOSIS — E782 Mixed hyperlipidemia: Secondary | ICD-10-CM | POA: Insufficient documentation

## 2018-06-22 DIAGNOSIS — Z794 Long term (current) use of insulin: Secondary | ICD-10-CM | POA: Insufficient documentation

## 2018-06-22 DIAGNOSIS — I10 Essential (primary) hypertension: Secondary | ICD-10-CM | POA: Insufficient documentation

## 2018-07-07 MED FILL — TRUEPLUS PEN NDL 32GX5/32: 32G X 4 MM | 20 days supply | Qty: 100 | Fill #0

## 2018-07-07 MED FILL — TRUEPLUS PEN NDL 32GX5/32": 32G X 4 MM | 20 days supply | Qty: 100 | Fill #0

## 2018-07-07 MED FILL — NovoLOG 100 UNIT/ML SOLN: 100 | 30 days supply | Qty: 30 | Fill #0

## 2018-07-07 MED FILL — LANTUS SOLOSTAR 100 UNITS/M: 100 | 12 days supply | Qty: 15 | Fill #1

## 2018-07-11 ENCOUNTER — Encounter (HOSPITAL_COMMUNITY): Payer: Self-pay

## 2018-07-11 ENCOUNTER — Ambulatory Visit (HOSPITAL_COMMUNITY)
Admission: EM | Admit: 2018-07-11 | Discharge: 2018-07-11 | Disposition: A | Discharge status: Home / Self Care | Payer: Medicaid Other | Attending: Internal Medicine | Admitting: Internal Medicine

## 2018-07-11 DIAGNOSIS — R51 Headache: Secondary | ICD-10-CM

## 2018-07-11 DIAGNOSIS — T8572XA Infection and inflammatory reaction due to insulin pump, initial encounter: Secondary | ICD-10-CM

## 2018-07-11 DIAGNOSIS — R519 Headache, unspecified: Secondary | ICD-10-CM

## 2018-07-11 MED ORDER — KETOROLAC TROMETHAMINE 60 MG/2ML IM SOLN
INTRAMUSCULAR | Status: AC
  Filled 2018-07-11: qty 2

## 2018-07-11 MED ORDER — MUPIROCIN 2 % EX OINT: 1.0000 "application " | TOPICAL_OINTMENT | Freq: Two times a day (BID) | CUTANEOUS | 0 refills | Status: AC

## 2018-07-11 MED ORDER — KETOROLAC TROMETHAMINE 60 MG/2ML IM SOLN
60.0000 mg | Freq: Once | INTRAMUSCULAR | Status: AC
  Administered 2018-07-11: 60 mg via INTRAMUSCULAR

## 2018-07-11 NOTE — ED Provider Notes (Signed)
[facility]    CSN: [id number] Arrival date & time: 07/11/18  1648     History   Chief Complaint Chief Complaint  Patient presents with  . Headache  . Hot Sweats    HPI Sarah Garrison is a 38 y.o. female.   She is a diabetic patient who presents today after starting with an insulin pump this week.  She reports that her PCP felt that she was forgetting to take insulin doses too often and and she was also having a lot of trouble with yeast infections.  She is having some discomfort at the initial pump site in the right lower abdomen, and when she moved the pump yesterday to a site slightly higher in the right abdomen it was also uncomfortable.  She has felt uncomfortable about sleeping on her belly, lying on the pump, but has not slept well trying to sleep in other positions.  She has a bad headache today, right frontal, gets headaches sometimes.  She has also had some sweats, and is worried about the significance of the sweats. No cough, no runny/congested nose, not achy.  No nausea/vomiting, no diarrhea.    HPI  Past Medical History:  Diagnosis Date  . Asthma   . Diabetes mellitus without complication (HCC)   . GERD (gastroesophageal reflux disease)   . Hypertension     Patient Active Problem List   Diagnosis Date Noted  . Uncontrolled type 2 diabetes mellitus with complication, with long-term current use of insulin (HCC) 12/13/2016  . Non-adherence to medical treatment 12/13/2016  . Asthma 07/30/2016  . 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/07/2016  . Encounter for pregnancy test 02/01/2016  . Leg swelling 09/29/2015  . Morbid obesity, unspecified obesity type (HCC) 09/29/2015  . DM neuropathy, type II diabetes mellitus (HCC) 08/24/2015    Past Surgical History:  Procedure Laterality Date  . CESAREAN SECTION    . TUBAL LIGATION    . VENTRAL HERNIA REPAIR N/A 11/27/2017   Procedure: VENTRAL HERNIA REPAIR ERAS PATHWAY;  Surgeon: Harriette Bouillon, MD;  Location: [facility];  Service: General;  Laterality: N/A;  . WISDOM TOOTH EXTRACTION      OB History    Gravida  5   Para  4   Term      Preterm      AB  1   Living  4     SAB  1   TAB      Ectopic      Multiple      Live Births               Home Medications    Prior to Admission medications   Medication Sig Start Date End Date Taking? Authorizing Provider  ACCU-CHEK FASTCLIX LANCETS MISC Test 4 times daily 03/03/18   Reather Littler, MD  albuterol (PROVENTIL HFA;VENTOLIN HFA) 108 (90 Base) MCG/ACT inhaler Inhale 1-2 puffs into the lungs every 6 (six) hours as needed for wheezing or shortness of breath. 05/14/18   Claiborne Rigg, NP  atorvastatin (LIPITOR) 20 MG tablet Take 1 tablet (20 mg total) by mouth daily. 11/23/17   Claiborne Rigg, NP  calcium carbonate (TUMS) 500 MG chewable tablet Chew 2-3 tablets by mouth as directed.    [provider]  fluconazole (DIFLUCAN) 150 MG tablet Take 1 tablet (150 mg total) by mouth daily. 06/13/18   Hoy Register, MD  glucose blood (ACCU-CHEK GUIDE) test strip Test 4 times daily. 03/03/18   Lucianne Muss,  Viviano Simas, MD  hydrocortisone cream (MONISTAT SOOTHING CARE ITCH) 1 % Apply 1 application topically 2 (two) times daily. 05/19/18   Claiborne Rigg, NP  insulin aspart (NOVOLOG FLEXPEN) 100 UNIT/ML FlexPen Inject 10 Units into the skin 3 (three) times daily. 06/16/18   [provider]  LANTUS SOLOSTAR 100 UNIT/ML Solostar Pen 34 units twice daily Patient taking differently: Inject 60 Units into the skin daily.  11/20/17   Claiborne Rigg, NP  lisinopril (PRINIVIL,ZESTRIL) 20 MG tablet Take 1 tablet (20 mg total) by mouth daily. 01/09/18   Anders Simmonds, PA-C  loperamide (IMODIUM A-D) 2 MG tablet Take 1 tablet (2 mg total) by mouth 4 (four) times daily as needed for diarrhea or loose stools. 05/14/18   Claiborne Rigg, NP  metoCLOPramide (REGLAN) 5 MG tablet Take 1 tablet (5 mg total) by mouth 2 (two) times daily. Take  at breakfast and bedtime 06/18/18   Sherrilyn Rist, MD  mupirocin ointment (BACTROBAN) 2 % Apply 1 application topically 2 (two) times daily for 7 days. 07/11/18 07/18/18  Isa Rankin, MD  nystatin (NYSTATIN) powder Apply topically 3 (three) times daily. 04/21/18   Hoy Register, MD  omeprazole (PRILOSEC) 40 MG capsule Take 1 capsule (40 mg total) by mouth daily. 06/18/18   Sherrilyn Rist, MD  pantoprazole (PROTONIX) 40 MG tablet Take 40 mg by mouth 2 (two) times daily before a meal.    [provider]  TRUEPLUS PEN NEEDLES 32G X 4 MM MISC USE 3 TIMES DAILY AS DIRECTED 06/16/18   Hoy Register, MD    Family History Family History  Problem Relation Age of Onset  . Asthma Mother   . Kidney failure Mother   . Brain cancer Mother   . Asthma Father   . Other Father        surgery on stomach but don't know from what  . Diabetes Brother   . Diabetes Maternal Grandmother     Social History Social History   Tobacco Use  . Smoking status: Former Smoker    Packs/day: 0.10    Types: Cigarettes    Last attempt to quit: 11/27/2017    Years since quitting: 0.6  . Smokeless tobacco: Never Used  Substance Use Topics  . Alcohol use: No  . Drug use: No     Allergies   Morphine and related; Glyburide; Ivp dye [iodinated diagnostic agents]; Oxycodone; and Vicodin [hydrocodone-acetaminophen]   Review of Systems Review of Systems  All other systems reviewed and are negative.    Physical Exam Triage Vital Signs ED Triage Vitals  Enc Vitals Group     BP 07/11/18 1712 (!) 132/98     Pulse Rate 07/11/18 1710 (!) 104     Resp 07/11/18 1710 20     Temp 07/11/18 1712 98.4 F (36.9 C)     Temp Source 07/11/18 1710 Oral     SpO2 07/11/18 1710 100 %     Weight --      Height --      Pain Score 07/11/18 1713 8     Pain Loc --    Updated Vital Signs BP (!) 132/98 (BP Location: Right Arm)   Pulse (!) 104   Temp 98.4 F (36.9 C)   Resp 20   SpO2 100%  Physical  Exam Vitals signs and nursing note reviewed.  Constitutional:      General: She is not in acute distress.    Comments: Alert,  nicely groomed  HENT:     Head: Atraumatic.  Eyes:     Comments: Conjugate gaze, no eye redness/drainage  Neck:     Musculoskeletal: Neck supple.  Cardiovascular:     Rate and Rhythm: Normal rate.  Pulmonary:     Effort: No respiratory distress.  Abdominal:     General: There is no distension.  Musculoskeletal: Normal range of motion.     Comments: No leg swelling  Skin:    General: Skin is warm and dry.     Comments: No cyanosis 1.5 cm flat faintly erythematous zone around the previous puncture site in the right lower abdomen, slightly tender to palpation, slightly bruised.  Indurated, not swollen, not fluctuant; near this there is a faint outline of the pump adhesive, not quite pink. Pump is located above this in the right upper abdomen, and at the end with the cannula it is slightly tender to palpation.  No erythema is seen extending under the adhesive.  Neurological:     Mental Status: She is alert and oriented to person, place, and time.     Comments: Patient walked into the urgent care independently, was able to climb on and off the exam table without difficulty.  Is symmetric and speech is clear/coherent.      UC Treatments / Results   Procedures Procedures (including critical care time)  Medications Ordered in UC Medications  ketorolac (TORADOL) injection 60 mg (60 mg Intramuscular Given 07/11/18 1804)    Final Clinical Impressions(s) / UC Diagnoses   Final diagnoses:  Bad headache  Inflammatory reaction due to insulin pump, initial encounter [facility](HCC)     Discharge Instructions     No danger signs on exam.  Previous insulin pump site has a minor amount of redness/irritation present at puncture site.  As a precaution, prescription for antibiotic ointment sent to the pharmacy.  Wash gently with soap/water 1-2 times daily, and apply thin  smear of antibiotic ointment to site. Recheck if increasing redness/swelling/pain/drainage or new fever>100.5, or if not starting to improve in a few days.     New pump site looks good.  Abdominal sensitivity to the pump is likely to improve as you try different placements for the pump.  Other possible causes of sweating include decrease in blood sugars, viral infection like colds or flu.  Injection of ketorolac (anti inflammatory/pain reliever) given at the urgent care for headache.  Ice for 5-10 minutes several times daily as needed for headache may also decrease pain.      ED Prescriptions    Medication Sig Dispense Auth. Provider   mupirocin ointment (BACTROBAN) 2 % Apply 1 application topically 2 (two) times daily for 7 days. 22 g Isa RankinMurray, Kaston Faughn Wilson, MD        Isa RankinMurray, Jsean Taussig Wilson, MD 07/12/18 1007

## 2018-07-11 NOTE — ED Triage Notes (Signed)
Pt presents with headache, hot sweats and pain in abdominal area from insulin pump.

## 2018-07-11 NOTE — Discharge Instructions (Addendum)
No danger signs on exam.  Previous insulin pump site has a minor amount of redness/irritation present at puncture site.  As a precaution, prescription for antibiotic ointment sent to the pharmacy.  Wash gently with soap/water 1-2 times daily, and apply thin smear of antibiotic ointment to site. Recheck if increasing redness/swelling/pain/drainage or new fever>100.5, or if not starting to improve in a few days.     New pump site looks good.  Abdominal sensitivity to the pump is likely to improve as you try different placements for the pump.  Other possible causes of sweating include decrease in blood sugars, viral infection like colds or flu.  Injection of ketorolac (anti inflammatory/pain reliever) given at the urgent care for headache.  Ice for 5-10 minutes several times daily as needed for headache may also decrease pain.

## 2018-07-14 MED FILL — MUPIROCIN 2% OINTMENT: 2 | 10 days supply | Qty: 22 | Fill #0

## 2018-07-21 MED FILL — ACCU-CHEK FASTCLIX LANCETS: 26 days supply | Qty: 102 | Fill #1

## 2018-07-22 DIAGNOSIS — M79676 Pain in unspecified toe(s): Secondary | ICD-10-CM

## 2018-07-24 MED FILL — NovoLOG 100 UNIT/ML SOLN: 100 | 20 days supply | Qty: 40 | Fill #0

## 2018-07-29 ENCOUNTER — Ambulatory Visit: Payer: Medicaid Other | Admitting: Gastroenterology

## 2018-07-29 ENCOUNTER — Encounter: Payer: Self-pay | Admitting: Gastroenterology

## 2018-07-29 VITALS — BP 110/82 | HR 100 | Ht 65.0 in | Wt 232.1 lb

## 2018-07-29 DIAGNOSIS — K219 Gastro-esophageal reflux disease without esophagitis: Secondary | ICD-10-CM

## 2018-07-29 DIAGNOSIS — K625 Hemorrhage of anus and rectum: Secondary | ICD-10-CM | POA: Diagnosis not present

## 2018-07-29 DIAGNOSIS — IMO0002 Reserved for concepts with insufficient information to code with codable children: Secondary | ICD-10-CM

## 2018-07-29 DIAGNOSIS — E118 Type 2 diabetes mellitus with unspecified complications: Secondary | ICD-10-CM | POA: Diagnosis not present

## 2018-07-29 DIAGNOSIS — R1115 Cyclical vomiting syndrome unrelated to migraine: Secondary | ICD-10-CM | POA: Diagnosis not present

## 2018-07-29 DIAGNOSIS — Z794 Long term (current) use of insulin: Secondary | ICD-10-CM

## 2018-07-29 DIAGNOSIS — E1165 Type 2 diabetes mellitus with hyperglycemia: Secondary | ICD-10-CM

## 2018-07-29 DIAGNOSIS — R194 Change in bowel habit: Secondary | ICD-10-CM

## 2018-07-29 NOTE — Progress Notes (Signed)
[facility] Progress Note  Chief Complaint: Nausea and vomiting  Subjective  History:  Sarah Garrison follows up for her nausea and vomiting. I started her on metoclopramide 5 mg at bedtime and in the morning.  She is since been seen in endocrinology follow-up, control has still been poor with average glucose 321. She was in the emergency department a few weeks ago with a headache and pain at the site of her insulin pump.  ED provider did not feel that the pump site was infected.  She reports that her blood sugars have been improved, typically in the 100s.  She complains of burning at the site of insulin pump.  She has occasional belching, no longer has nausea or vomiting, so she stopped the metoclopramide.  Her bowel habits have been irregular, alternating between diarrhea and constipation.  When she has constipation she may have rectal pain and bleeding.  She declined a chaperoned rectal exam today.  ROS: Cardiovascular:  no chest pain Respiratory: no dyspnea  The patient's Past Medical, Family and Social History were reviewed and are on file in the EMR.  Objective:  Med list reviewed  Current Outpatient Medications:  .  ACCU-CHEK FASTCLIX LANCETS MISC, Test 4 times daily, Disp: 120 each, Rfl: 3 .  albuterol (PROVENTIL HFA;VENTOLIN HFA) 108 (90 Base) MCG/ACT inhaler, Inhale 1-2 puffs into the lungs every 6 (six) hours as needed for wheezing or shortness of breath., Disp: 1 Inhaler, Rfl: 0 .  atorvastatin (LIPITOR) 20 MG tablet, Take 1 tablet (20 mg total) by mouth daily., Disp: 90 tablet, Rfl: 3 .  calcium carbonate (TUMS) 500 MG chewable tablet, Chew 2-3 tablets by mouth as directed., Disp: , Rfl:  .  glucose blood (ACCU-CHEK GUIDE) test strip, Test 4 times daily., Disp: 100 each, Rfl: 12 .  hydrocortisone cream (MONISTAT SOOTHING CARE ITCH) 1 %, Apply 1 application topically 2 (two) times daily., Disp: 30 g, Rfl: 0 .  insulin aspart (NOVOLOG FLEXPEN) 100 UNIT/ML FlexPen,  Inject 10 Units into the skin 3 (three) times daily., Disp: , Rfl:  .  Insulin Human (INSULIN PUMP) SOLN, Inject into the skin. Novolog Basal and Bolus, Disp: , Rfl:  .  lisinopril (PRINIVIL,ZESTRIL) 20 MG tablet, Take 1 tablet (20 mg total) by mouth daily., Disp: 90 tablet, Rfl: 3 .  loperamide (IMODIUM A-D) 2 MG tablet, Take 1 tablet (2 mg total) by mouth 4 (four) times daily as needed for diarrhea or loose stools., Disp: 30 tablet, Rfl: 0 .  metoCLOPramide (REGLAN) 5 MG tablet, Take 1 tablet (5 mg total) by mouth 2 (two) times daily. Take at breakfast and bedtime (Patient taking differently: Take 5 mg by mouth as needed. Take at breakfast and bedtime), Disp: 60 tablet, Rfl: 1 .  nystatin (NYSTATIN) powder, Apply topically 3 (three) times daily., Disp: 45 g, Rfl: 0 .  omeprazole (PRILOSEC) 40 MG capsule, Take 1 capsule (40 mg total) by mouth daily., Disp: 90 capsule, Rfl: 3 .  TRUEPLUS PEN NEEDLES 32G X 4 MM MISC, USE 3 TIMES DAILY AS DIRECTED, Disp: 100 each, Rfl: 5   Vital signs in last 24 hrs: Vitals:   07/29/18 0817  BP: 110/82  Pulse: 100    Physical Exam   HEENT: sclera anicteric, oral mucosa moist without lesions  Neck: supple, no thyromegaly, JVD or lymphadenopathy  Cardiac: RRR without murmurs, S1S2 heard, no peripheral edema  Pulm: clear to auscultation bilaterally, normal RR and effort noted  Abdomen: soft, scattered abdominal wall  tenderness (says she is still tender at sites of prior insulin injection), with active bowel sounds. No guarding or palpable hepatosplenomegaly, limited by body habitus.  Skin; warm and dry, no jaundice or rash  Recent Labs:  Glucose values reviewed and recent endocrinology note on file through care everywhere.  No labs during recent ED visit.   @ASSESSMENTPLANBEGIN @ Assessment: Encounter Diagnoses  Name Primary?  . Gastroesophageal reflux disease, esophagitis presence not specified Yes  . Non-intractable cyclical vomiting with  nausea   . Uncontrolled type 2 diabetes mellitus with complication, with long-term current use of insulin (HCC)   . Rectal bleeding   . Bowel habit changes    Her nausea and vomiting are much improved with better glucose control.  Metoclopramide can be taken as needed for severe nausea and vomiting.  Bowel habit changes also likely related to poorly controlled diabetes and diet.  Her rectal pain and bleeding sound most likely to be fissure related to constipation, but she would not allow me to examine it today.  She has been taking several stool softeners during periods of constipation.  I gave her samples of RectiCare ointment.  Instructions were written down for her.  She will see me as needed.   Total time 20 minutes, over half spent face-to-face with patient in counseling and coordination of care.   Charlie Pitter III

## 2018-07-29 NOTE — Patient Instructions (Addendum)
The metoclopramide that I prescribed at your first visit can be taken as needed for severe nausea or vomiting.  The recticare ointment can be used as needed for rectal pain and bleeding. Samples given #4 exp 03-2020  If you are age 37 or older, your body mass index should be between 23-30. Your Body mass index is 38.63 kg/m. If this is out of the aforementioned range listed, please consider follow up with your Primary Care Provider.  If you are age 5 or younger, your body mass index should be between 19-25. Your Body mass index is 38.63 kg/m. If this is out of the aformentioned range listed, please consider follow up with your Primary Care Provider.   It was a pleasure to see you today!  Dr. Myrtie Neither

## 2018-08-06 MED FILL — ACCU-CHEK AVIVA PLUS STRP: 25 days supply | Qty: 100 | Fill #0

## 2018-08-15 ENCOUNTER — Other Ambulatory Visit: Payer: Self-pay

## 2018-08-15 ENCOUNTER — Ambulatory Visit: Payer: Medicaid Other | Attending: Nurse Practitioner | Admitting: Emergency Medicine

## 2018-08-15 DIAGNOSIS — Z3042 Encounter for surveillance of injectable contraceptive: Secondary | ICD-10-CM | POA: Diagnosis not present

## 2018-08-15 MED ORDER — MEDROXYPROGESTERONE ACETATE 150 MG/ML IM SUSP
150.0000 mg | Freq: Once | INTRAMUSCULAR | Status: AC
  Administered 2018-08-15: 150 mg via INTRAMUSCULAR

## 2018-08-15 NOTE — Progress Notes (Signed)
Patient arrived ambulatory, alert and orientated to clinic.  Patient is in clinic for  A medroxyprogesterone shot.  Patient given injection and paper when she should return.

## 2018-08-19 ENCOUNTER — Ambulatory Visit: Payer: Medicaid Other

## 2018-08-21 ENCOUNTER — Encounter: Payer: Self-pay | Admitting: Podiatry

## 2018-08-24 ENCOUNTER — Encounter (HOSPITAL_COMMUNITY): Payer: Self-pay | Admitting: *Deleted

## 2018-08-24 ENCOUNTER — Other Ambulatory Visit: Payer: Self-pay

## 2018-08-24 ENCOUNTER — Ambulatory Visit (HOSPITAL_COMMUNITY)
Admission: EM | Admit: 2018-08-24 | Discharge: 2018-08-24 | Disposition: A | Discharge status: Home / Self Care | Payer: Medicaid Other

## 2018-08-24 DIAGNOSIS — J029 Acute pharyngitis, unspecified: Secondary | ICD-10-CM | POA: Diagnosis not present
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
## 2018-08-24 NOTE — Discharge Instructions (Signed)
Throat lozenges, gargles, chloraseptic spray, warm teas, popsicles etc to help with throat pain.   Tylenol and/or ibuprofen as needed for pain or fevers.  This may be related to the sanitizer or may also be coincidence and related to post nasal drip, continue to monitor symptoms, use of decongestants as needed.  If symptoms worsen or do not improve in the next week to return to be seen or to follow up with your PCP.
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
## 2018-08-25 ENCOUNTER — Ambulatory Visit: Payer: Medicaid Other | Admitting: Podiatry

## 2018-08-25 ENCOUNTER — Encounter: Payer: Self-pay | Admitting: Podiatry

## 2018-08-25 ENCOUNTER — Ambulatory Visit (INDEPENDENT_AMBULATORY_CARE_PROVIDER_SITE_OTHER): Payer: Medicaid Other

## 2018-08-25 ENCOUNTER — Other Ambulatory Visit: Payer: Self-pay | Admitting: Podiatry

## 2018-08-25 ENCOUNTER — Telehealth: Payer: Self-pay | Admitting: Nurse Practitioner

## 2018-08-25 DIAGNOSIS — E1149 Type 2 diabetes mellitus with other diabetic neurological complication: Secondary | ICD-10-CM

## 2018-08-25 DIAGNOSIS — M7752 Other enthesopathy of left foot: Secondary | ICD-10-CM | POA: Diagnosis not present

## 2018-08-25 DIAGNOSIS — B351 Tinea unguium: Secondary | ICD-10-CM

## 2018-08-25 DIAGNOSIS — M7751 Other enthesopathy of right foot: Secondary | ICD-10-CM

## 2018-08-25 DIAGNOSIS — M79674 Pain in right toe(s): Secondary | ICD-10-CM | POA: Diagnosis not present

## 2018-08-25 DIAGNOSIS — M779 Enthesopathy, unspecified: Secondary | ICD-10-CM

## 2018-08-25 DIAGNOSIS — M79675 Pain in left toe(s): Secondary | ICD-10-CM | POA: Diagnosis not present

## 2018-08-25 DIAGNOSIS — M79672 Pain in left foot: Secondary | ICD-10-CM

## 2018-08-25 MED FILL — NovoLOG 100 UNIT/ML SOLN: 100 | 20 days supply | Qty: 40 | Fill #1

## 2018-08-25 NOTE — Telephone Encounter (Signed)
Patients call returned.  Patient identified by name and date of birth.  Patient states she has a sinus infection.  Patient states she is congested.  Patient seen at Urgent Care yesterday.  Patient advised to follow recommendations from UC.    "Throat lozenges, gargles, chloraseptic spray, warm teas, popsicles etc to help with throat pain.   Tylenol and/or ibuprofen as needed for pain or fevers continue to monitor symptoms, use of decongestants as needed."   Patient acknowledged understanding of discharge instructions.

## 2018-08-25 NOTE — Progress Notes (Signed)
Subjective:   Patient ID: Sarah Garrison, female   DOB: 38 y.o.   MRN: [id number]   HPI Patient states that there is been some irritation in the big toenail right foot that is been moderately sore and that on the left foot in the midfoot she is concerned that there may be an injury and she did something to it   ROS      Objective:  Physical Exam  Neurovascular status intact with incurvated nailbeds with irritation of the medial border of the right hallux localized with no drainage noted erythema or proximal edema erythema or drainage noted.  All nails are mildly incurvated and sore and she does have diabetes and is been in poor control.  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  I did sterile debride all nailbeds with no iatrogenic bleeding and this can be done periodically but I did not note any drainage or other pathology with the right hallux and I advised her to just watch it and if it should have any problems to reappoint immediately  X-rays indicated that there was no signs of pathology left and no indication of fracture or arthritis

## 2018-08-25 NOTE — Patient Instructions (Signed)

## 2018-08-25 NOTE — Telephone Encounter (Signed)
Patient called stating she is having a sinus infection and needs an Rx. Please f/u with pt.

## 2018-08-27 ENCOUNTER — Telehealth: Payer: Self-pay | Admitting: Nurse Practitioner

## 2018-08-27 NOTE — Telephone Encounter (Signed)
Patients call returned.  Patient identified by name and date of birth. Patient still having symptoms related to her sinuses.  Patient congested.  Patient denies fever, cough or respiratory distress.  Patient states her sore throat is not hurting her now.    Patient advised to follow course of action prescribed previously.  Patient acknowledged understanding.

## 2018-08-27 NOTE — Telephone Encounter (Signed)
Patient  Still having a severe   Sinus Infection and she did everything you told her yesterday  And is not working . Please, call her back

## 2018-09-23 MED FILL — NovoLOG 100 UNIT/ML SOLN: 100 | 20 days supply | Qty: 40 | Fill #2

## 2018-09-27 MED FILL — LANTUS SOLOSTAR 100 UNITS/M: 100 | 30 days supply | Qty: 15 | Fill #0

## 2018-09-27 MED FILL — TRUEPLUS PEN NDL 32GX5/32": 32G X 4 MM | 20 days supply | Qty: 100 | Fill #0

## 2018-09-27 MED FILL — TRUEPLUS PEN NDL 32GX5/32: 32G X 4 MM | 20 days supply | Qty: 100 | Fill #0

## 2018-10-03 ENCOUNTER — Encounter: Payer: Self-pay | Admitting: Nurse Practitioner

## 2018-10-06 ENCOUNTER — Other Ambulatory Visit: Payer: Self-pay

## 2018-10-06 ENCOUNTER — Ambulatory Visit: Payer: Medicaid Other | Attending: Nurse Practitioner | Admitting: Nurse Practitioner

## 2018-10-06 ENCOUNTER — Encounter: Payer: Self-pay | Admitting: Nurse Practitioner

## 2018-10-06 DIAGNOSIS — Z87891 Personal history of nicotine dependence: Secondary | ICD-10-CM | POA: Insufficient documentation

## 2018-10-06 DIAGNOSIS — E119 Type 2 diabetes mellitus without complications: Secondary | ICD-10-CM | POA: Insufficient documentation

## 2018-10-06 DIAGNOSIS — J45909 Unspecified asthma, uncomplicated: Secondary | ICD-10-CM | POA: Insufficient documentation

## 2018-10-06 DIAGNOSIS — H109 Unspecified conjunctivitis: Secondary | ICD-10-CM | POA: Diagnosis not present

## 2018-10-06 DIAGNOSIS — I10 Essential (primary) hypertension: Secondary | ICD-10-CM | POA: Diagnosis not present

## 2018-10-06 DIAGNOSIS — Z808 Family history of malignant neoplasm of other organs or systems: Secondary | ICD-10-CM | POA: Insufficient documentation

## 2018-10-06 DIAGNOSIS — H1089 Other conjunctivitis: Secondary | ICD-10-CM | POA: Insufficient documentation

## 2018-10-06 DIAGNOSIS — Z825 Family history of asthma and other chronic lower respiratory diseases: Secondary | ICD-10-CM | POA: Diagnosis not present

## 2018-10-06 DIAGNOSIS — Z833 Family history of diabetes mellitus: Secondary | ICD-10-CM | POA: Diagnosis not present

## 2018-10-06 MED ORDER — POLYMYXIN B-TRIMETHOPRIM 10000-0.1 UNIT/ML-% OP SOLN: OPHTHALMIC | 0 refills | Status: DC

## 2018-10-06 NOTE — Progress Notes (Signed)
Virtual Visit via Telephone Note Due to national recommendations of social distancing due to COVID 19, telehealth visit is felt to be most appropriate for this patient at this time.  I discussed the limitations, risks, security and privacy concerns of performing an evaluation and management service by telephone and the availability of in person appointments. I also discussed with the patient that there may be a patient responsible charge related to this service. The patient expressed understanding and agreed to proceed.    I connected with Sarah Garrison on 10/06/18  at  10:40 AM EDT by telephone and verified that I am speaking with the correct person using two identifiers.   Consent I discussed the limitations, risks, security and privacy concerns of performing an evaluation and management service by telephone and the availability of in person appointments. I also discussed with the patient that there may be a patient responsible charge related to this service. The patient expressed understanding and agreed to proceed.   Location of Patient: [employer]   [facility]    Persons participating in Telemedicine visit: Bertram DenverZelda Runa Whittingham FNP-BC YY [place] CMA Sarah SheriffAlicia M Cuaresma    History of Present Illness: Telemedicine visit for: Eye infection  Patient was very argumentative and yelling as soon as I introduced myself on the call. Apparently she was instructed by a staff member on Friday (3 days ago) to upload pictures of a swollen eye into the my chart system. Unfortunately there was no documentation from the staff member regarding them speaking to the patient over the phone and instructing her to upload pictures so I was unaware until today the nature of patient's uploading of pictures or whom she had spoken to. After viewing the picture I explained to her that I would be sending in an antibiotic drop for her eye. She immediately started yelling again  stating "I have been out of work since last Thursday for my eye and what if this antibiotic doesn't work then I have to start all over again. I need to speak to a supervisor!" I explained to her that with any eye infection it will take some time to see any improvement and if she does not notice any improvement within a few days we could reassess. She states she has been taking an prescription antibiotic for her eye that was prescribed by an urgent care. I am unable to locate a recent Urgent care visit or antibiotic for her eye in my chart or from contacting the pharmacy. Per our records she has not been on an antibiotic eye drop since 11-02-2016 and she has not been prescribed an allergy eye drop since 11-20-2017.  States she was recently given eye drops from her pharmacy however I am unable to locate any prescriptions for eye drops. I instructed her that if she is using an expired medication it would likely not be effective.   She hung up the phone mid speech.   Past Medical History:  Diagnosis Date  . Asthma   . Diabetes mellitus without complication (HCC)   . GERD (gastroesophageal reflux disease)   . Hypertension     Past Surgical History:  Procedure Laterality Date  . CESAREAN SECTION    . HERNIA REPAIR    . TUBAL LIGATION    . VENTRAL HERNIA REPAIR N/A 11/27/2017   Procedure: VENTRAL HERNIA REPAIR ERAS PATHWAY;  Surgeon: Harriette Bouillonornett, Thomas, MD;  [facility];  Service: General;  Laterality: N/A;  .  WISDOM TOOTH EXTRACTION      Family History  Problem Relation Age of Onset  . Asthma Mother   . Kidney failure Mother   . Brain cancer Mother   . Asthma Father   . Other Father        surgery on stomach but don't know from what  . Diabetes Brother   . Diabetes Maternal Grandmother     Social History   Socioeconomic History  . Marital status: Single    Spouse name: Not on file  . Number of children: 4  . Years of education: Not on file  . Highest education level:  Not on file  Occupational History  . Not on file  Social Needs  . Financial resource strain: Not on file  . Food insecurity:    Worry: Not on file    Inability: Not on file  . Transportation needs:    Medical: Not on file    Non-medical: Not on file  Tobacco Use  . Smoking status: Former Smoker    Packs/day: 0.10    Types: Cigarettes    Last attempt to quit: 11/27/2017    Years since quitting: 0.8  . Smokeless tobacco: Never Used  Substance and Sexual Activity  . Alcohol use: No  . Drug use: No  . Sexual activity: Not on file  Lifestyle  . Physical activity:    Days per week: Not on file    Minutes per session: Not on file  . Stress: Not on file  Relationships  . Social connections:    Talks on phone: Not on file    Gets together: Not on file    Attends religious service: Not on file    Active member of club or organization: Not on file    Attends meetings of clubs or organizations: Not on file    Relationship status: Not on file  Other Topics Concern  . Not on file  Social History Narrative  . Not on file     Observations/Objective: Awake, alert and oriented x 3   Review of Systems  Constitutional: Negative for fever, malaise/fatigue and weight loss.  HENT: Negative.  Negative for nosebleeds.   Eyes: Positive for pain, discharge and redness. Negative for blurred vision, double vision and photophobia.  Respiratory: Negative.  Negative for cough and shortness of breath.   Cardiovascular: Negative.  Negative for chest pain, palpitations and leg swelling.    Assessment and Plan:  Diagnoses and all orders for this visit:  Bacterial conjunctivitis -     trimethoprim-polymyxin b (POLYTRIM) ophthalmic solution; Place 2 drops into the affected eye 4 (four) times per day for 10 (ten) days. Patient also has allergies. Will treat as bacterial infection today as opposed to allergic conjunctivitis based on symptoms.     Follow Up Instructions Return if symptoms worsen  or fail to improve.       I discussed the assessment and treatment plan with the patient. The patient was provided an opportunity to ask questions and all were answered. The patient agreed with the plan and demonstrated an understanding of the instructions.   The patient was advised to call back or seek an in-person evaluation if the symptoms worsen or if the condition fails to improve as anticipated.  I provided 6 minutes of non-face-to-face time during this encounter including median intraservice time, reviewing previous notes, labs, imaging, medications and explaining diagnosis and management.  Claiborne Rigg, FNP-BC

## 2018-10-09 ENCOUNTER — Encounter: Payer: Self-pay | Admitting: Nurse Practitioner

## 2018-10-22 ENCOUNTER — Encounter: Payer: Self-pay | Admitting: Nurse Practitioner

## 2018-10-24 ENCOUNTER — Other Ambulatory Visit: Payer: Self-pay

## 2018-10-24 ENCOUNTER — Ambulatory Visit (HOSPITAL_COMMUNITY)
Admission: EM | Admit: 2018-10-24 | Discharge: 2018-10-24 | Disposition: A | Discharge status: Home / Self Care | Payer: Medicaid Other | Attending: Internal Medicine | Admitting: Internal Medicine

## 2018-10-24 ENCOUNTER — Encounter (HOSPITAL_COMMUNITY): Payer: Self-pay | Admitting: Emergency Medicine

## 2018-10-24 DIAGNOSIS — L0291 Cutaneous abscess, unspecified: Secondary | ICD-10-CM

## 2018-10-24 MED ORDER — FLUCONAZOLE 150 MG PO TABS: ORAL_TABLET | ORAL | 0 refills | Status: DC

## 2018-10-24 MED ORDER — IBUPROFEN 800 MG PO TABS
800.0000 mg | ORAL_TABLET | Freq: Once | ORAL | Status: AC
  Administered 2018-10-24: 16:00:00 800 mg via ORAL

## 2018-10-24 MED ORDER — SULFAMETHOXAZOLE-TRIMETHOPRIM 800-160 MG PO TABS: 1.0000 | ORAL_TABLET | Freq: Two times a day (BID) | ORAL | 0 refills | Status: AC

## 2018-10-24 MED ORDER — IBUPROFEN 800 MG PO TABS: 800.0000 mg | ORAL_TABLET | Freq: Three times a day (TID) | ORAL | 0 refills | Status: DC

## 2018-10-24 MED ORDER — CEPHALEXIN 500 MG PO CAPS: 500.0000 mg | ORAL_CAPSULE | Freq: Four times a day (QID) | ORAL | 0 refills | Status: DC

## 2018-10-24 MED ORDER — IBUPROFEN 800 MG PO TABS
ORAL_TABLET | ORAL | Status: AC
  Filled 2018-10-24: qty 1

## 2018-10-24 NOTE — Discharge Instructions (Signed)
Soak in tub 3-4 times a day or apply warm compress.  Complete course of antibiotics.  Tylenol and/or ibuprofen as needed for pain or fevers.   If no improvement or if worsening of symptoms please return as may need incision and drainage.

## 2018-10-24 NOTE — ED Provider Notes (Signed)
[facility]    CSN: [id number] Arrival date & time: 10/24/18  1427     History   Chief Complaint Chief Complaint  Patient presents with  . Abscess    HPI Sarah Garrison is a 38 y.o. female.   Angelina Sheriff presents with complaints of boil to right buttock. Noted two days ago. Has been taking warm baths which hasn't helped. Causes pain to entire buttocks and even down leg. No fevers. No gi complaints. Uses an insulin pump, states her blood sugars have been well. Has had similar in the past to right axilla which required I&D. No drainage. Hx of DM, asthma, htn.     ROS per HPI, negative if not otherwise mentioned.      Past Medical History:  Diagnosis Date  . Asthma   . Diabetes mellitus without complication (HCC)   . GERD (gastroesophageal reflux disease)   . Hypertension     Patient Active Problem List   Diagnosis Date Noted  . Essential hypertension 06/22/2018  . Mixed hyperlipidemia 06/22/2018  . Uncontrolled type 2 diabetes mellitus with complication, with long-term current use of insulin (HCC) 12/13/2016  . Non-adherence to medical treatment 12/13/2016  . Asthma 07/30/2016  . 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/07/2016  . Encounter for pregnancy test 02/01/2016  . Leg swelling 09/29/2015  . Morbid obesity, unspecified obesity type (HCC) 09/29/2015  . DM neuropathy, type II diabetes mellitus (HCC) 08/24/2015    Past Surgical History:  Procedure Laterality Date  . CESAREAN SECTION    . HERNIA REPAIR    . TUBAL LIGATION    . VENTRAL HERNIA REPAIR N/A 11/27/2017   Procedure: VENTRAL HERNIA REPAIR ERAS PATHWAY;  Surgeon: Harriette Bouillon, MD;  Location: [facility];  Service: General;  Laterality: N/A;  . WISDOM TOOTH EXTRACTION      OB History    Gravida  5   Para  4   Term      Preterm      AB  1   Living  4     SAB  1   TAB      Ectopic      Multiple      Live Births               Home Medications    Prior  to Admission medications   Medication Sig Start Date End Date Taking? Authorizing Provider  ACCU-CHEK FASTCLIX LANCETS MISC Test 4 times daily 03/03/18   Reather Littler, MD  albuterol (PROVENTIL HFA;VENTOLIN HFA) 108 (90 Base) MCG/ACT inhaler Inhale 1-2 puffs into the lungs every 6 (six) hours as needed for wheezing or shortness of breath. 05/14/18   Claiborne Rigg, NP  atorvastatin (LIPITOR) 20 MG tablet Take 1 tablet (20 mg total) by mouth daily. 11/23/17   Claiborne Rigg, NP  calcium carbonate (TUMS) 500 MG chewable tablet Chew 2-3 tablets by mouth as directed.    [provider]  cephALEXin (KEFLEX) 500 MG capsule Take 1 capsule (500 mg total) by mouth 4 (four) times daily for 10 days. 10/24/18 11/03/18  Georgetta Haber, NP  fluconazole (DIFLUCAN) 150 MG tablet Take 1 tablet today. If still with symptoms may repeat in 3 days. Complete as needed after antibiotics. 10/24/18   Linus Mako B, NP  glucose blood (ACCU-CHEK GUIDE) test strip Test 4 times daily. 03/03/18   Reather Littler, MD  hydrocortisone cream (MONISTAT SOOTHING CARE ITCH) 1 % Apply 1 application topically 2 (two) times  daily. 05/19/18   Claiborne RiggFleming, Zelda W, NP  ibuprofen (ADVIL) 800 MG tablet Take 1 tablet (800 mg total) by mouth 3 (three) times daily. 10/24/18   Georgetta HaberBurky,  B, NP  insulin aspart (NOVOLOG FLEXPEN) 100 UNIT/ML FlexPen Inject 10 Units into the skin 3 (three) times daily. 06/16/18   [provider]  Insulin Human (INSULIN PUMP) SOLN Inject into the skin. Novolog Basal and Bolus    [provider]  lisinopril (PRINIVIL,ZESTRIL) 20 MG tablet Take 1 tablet (20 mg total) by mouth daily. 01/09/18   Anders SimmondsMcClung, Angela M, PA-C  loperamide (IMODIUM A-D) 2 MG tablet Take 1 tablet (2 mg total) by mouth 4 (four) times daily as needed for diarrhea or loose stools. 05/14/18   Claiborne RiggFleming, Zelda W, NP  medroxyPROGESTERone Acetate (DEPO-PROVERA IM) Inject into the muscle.    [provider]  metoCLOPramide  (REGLAN) 5 MG tablet Take 1 tablet (5 mg total) by mouth 2 (two) times daily. Take at breakfast and bedtime 06/18/18   Sherrilyn Ristanis, Henry L III, MD  nystatin (NYSTATIN) powder Apply topically 3 (three) times daily. 04/21/18   Hoy RegisterNewlin, Enobong, MD  omeprazole (PRILOSEC) 40 MG capsule Take 1 capsule (40 mg total) by mouth daily. 06/18/18   Sherrilyn Ristanis, Henry L III, MD  trimethoprim-polymyxin b (POLYTRIM) ophthalmic solution Place 2 drops into the affected eye 4 (four) times per day for 10 (ten) days. 10/06/18   Claiborne RiggFleming, Zelda W, NP  TRUEPLUS PEN NEEDLES 32G X 4 MM MISC USE 3 TIMES DAILY AS DIRECTED 06/16/18   Hoy RegisterNewlin, Enobong, MD    Family History Family History  Problem Relation Age of Onset  . Asthma Mother   . Kidney failure Mother   . Brain cancer Mother   . Asthma Father   . Other Father        surgery on stomach but don't know from what  . Diabetes Brother   . Diabetes Maternal Grandmother     Social History Social History   Tobacco Use  . Smoking status: Former Smoker    Packs/day: 0.10    Types: Cigarettes    Last attempt to quit: 11/27/2017    Years since quitting: 0.9  . Smokeless tobacco: Never Used  Substance Use Topics  . Alcohol use: No  . Drug use: No     Allergies   Morphine and related; Glyburide; Ivp dye [iodinated diagnostic agents]; Oxycodone; and Vicodin [hydrocodone-acetaminophen]   Review of Systems Review of Systems   Physical Exam Triage Vital Signs ED Triage Vitals  Enc Vitals Group     BP 10/24/18 1445 (!) 152/101     Pulse Rate 10/24/18 1445 (!) 120     Resp 10/24/18 1445 18     Temp 10/24/18 1445 98.7 F (37.1 C)     Temp Source 10/24/18 1445 Oral     SpO2 10/24/18 1445 99 %     Weight --      Height --      Head Circumference --      Peak Flow --      Pain Score 10/24/18 1446 10     Pain Loc --      Pain Edu? --      Excl. in GC? --    No data found.  Updated Vital Signs BP (!) 152/101 (BP Location: Left Arm)   Pulse (!) 120   Temp 98.7 F  (37.1 C) (Oral)   Resp 18   SpO2 99%   Visual Acuity Right  Eye Distance:   Left Eye Distance:   Bilateral Distance:    Right Eye Near:   Left Eye Near:    Bilateral Near:     Physical Exam Constitutional:      General: She is not in acute distress.    Appearance: She is well-developed.  Cardiovascular:     Rate and Rhythm: Regular rhythm. Tachycardia present.     Heart sounds: Normal heart sounds.  Pulmonary:     Effort: Pulmonary effort is normal.     Breath sounds: Normal breath sounds.  Skin:    General: Skin is warm and dry.          Comments: Firm red boil; approximately 1 cm in diameter; no active drainage; minimal fluctuance; very small scab; very tender   Neurological:     Mental Status: She is alert and oriented to person, place, and time.      UC Treatments / Results  Labs (all labs ordered are listed, but only abnormal results are displayed) Labs Reviewed - No data to display  EKG None  Radiology No results found.  Procedures Procedures (including critical care time)  Medications Ordered in UC Medications  ibuprofen (ADVIL) tablet 800 mg (800 mg Oral Given 10/24/18 1535)    Initial Impression / Assessment and Plan / UC Course  I have reviewed the triage vital signs and the nursing notes.  Pertinent labs & imaging results that were available during my care of the patient were reviewed by me and considered in my medical decision making (see chart for details).     Minimal fluctuance today, incision deferred. Antibiotics initiated, motrin for pain, encouraged warm soaks. Return precautions discussed. Patient verbalized understanding and agreeable to plan.  Ambulatory out of clinic without difficulty.    Final Clinical Impressions(s) / UC Diagnoses   Final diagnoses:  Abscess     Discharge Instructions     Soak in tub 3-4 times a day or apply warm compress.  Complete course of antibiotics.  Tylenol and/or ibuprofen as needed for pain or  fevers.   If no improvement or if worsening of symptoms please return as may need incision and drainage.    ED Prescriptions    Medication Sig Dispense Auth. Provider   cephALEXin (KEFLEX) 500 MG capsule Take 1 capsule (500 mg total) by mouth 4 (four) times daily for 10 days. 40 capsule Linus Mako B, NP   ibuprofen (ADVIL) 800 MG tablet Take 1 tablet (800 mg total) by mouth 3 (three) times daily. 21 tablet Linus Mako B, NP   fluconazole (DIFLUCAN) 150 MG tablet Take 1 tablet today. If still with symptoms may repeat in 3 days. Complete as needed after antibiotics. 2 tablet Georgetta Haber, NP     Controlled Substance Prescriptions [place] Controlled Substance Registry consulted? Not Applicable   Georgetta Haber, NP 10/24/18 [phone]

## 2018-10-24 NOTE — ED Triage Notes (Signed)
Pt here for buttocks abscess on right check

## 2018-10-31 ENCOUNTER — Ambulatory Visit: Payer: Medicaid Other | Attending: Nurse Practitioner | Admitting: *Deleted

## 2018-10-31 ENCOUNTER — Other Ambulatory Visit: Payer: Self-pay

## 2018-10-31 DIAGNOSIS — Z3042 Encounter for surveillance of injectable contraceptive: Secondary | ICD-10-CM | POA: Diagnosis not present

## 2018-10-31 MED ORDER — MEDROXYPROGESTERONE ACETATE 150 MG/ML IM SUSP
150.0000 mg | Freq: Once | INTRAMUSCULAR | Status: AC
  Administered 2018-10-31: 10:00:00 150 mg via INTRAMUSCULAR

## 2018-10-31 MED FILL — NovoLOG 100 UNIT/ML SOLN: 100 | 20 days supply | Qty: 40 | Fill #3

## 2018-10-31 NOTE — Progress Notes (Signed)
Date last pap:02/09/2018 . Last Depo-Provera: 08/15/2018. Side Effects if any: none Serum HCG indicated? no. Depo-Provera 150 mg IM given in  Outer quadrant. Next appointment due . August 14 through August 28

## 2018-11-28 ENCOUNTER — Other Ambulatory Visit: Payer: Self-pay

## 2018-11-28 ENCOUNTER — Other Ambulatory Visit (HOSPITAL_COMMUNITY)
Admission: RE | Admit: 2018-11-28 | Discharge: 2018-11-28 | Disposition: A | Discharge status: Home / Self Care | Payer: Medicaid Other | Source: Ambulatory Visit | Attending: Nurse Practitioner | Admitting: Nurse Practitioner

## 2018-11-28 DIAGNOSIS — B3731 Acute candidiasis of vulva and vagina: Secondary | ICD-10-CM

## 2018-11-28 DIAGNOSIS — B373 Candidiasis of vulva and vagina: Secondary | ICD-10-CM | POA: Diagnosis present

## 2018-11-28 MED FILL — LISINOPRIL 20 MG TABLET: 20 | 30 days supply | Qty: 30 | Fill #1

## 2018-11-28 MED FILL — NovoLOG 100 UNIT/ML SOLN: 100 | 30 days supply | Qty: 60 | Fill #0

## 2018-11-28 MED FILL — JANUVIA 100 MG TABLET: 100 | 30 days supply | Qty: 30 | Fill #0

## 2018-12-02 LAB — CERVICOVAGINAL ANCILLARY ONLY
Bacterial vaginitis: NEGATIVE
Candida vaginitis: NEGATIVE
Chlamydia: NEGATIVE
Neisseria Gonorrhea: NEGATIVE
Trichomonas: NEGATIVE

## 2018-12-08 ENCOUNTER — Encounter (HOSPITAL_COMMUNITY): Payer: Self-pay

## 2018-12-08 ENCOUNTER — Emergency Department (HOSPITAL_COMMUNITY)
Admission: EM | Admit: 2018-12-08 | Discharge: 2018-12-08 | Disposition: A | Discharge status: Home / Self Care | Payer: Medicaid Other | Attending: Emergency Medicine | Admitting: Emergency Medicine

## 2018-12-08 ENCOUNTER — Other Ambulatory Visit: Payer: Self-pay

## 2018-12-08 DIAGNOSIS — Z794 Long term (current) use of insulin: Secondary | ICD-10-CM | POA: Diagnosis not present

## 2018-12-08 DIAGNOSIS — R590 Localized enlarged lymph nodes: Secondary | ICD-10-CM | POA: Diagnosis not present

## 2018-12-08 DIAGNOSIS — Z79899 Other long term (current) drug therapy: Secondary | ICD-10-CM | POA: Insufficient documentation

## 2018-12-08 DIAGNOSIS — H65191 Other acute nonsuppurative otitis media, right ear: Secondary | ICD-10-CM | POA: Diagnosis not present

## 2018-12-08 DIAGNOSIS — I10 Essential (primary) hypertension: Secondary | ICD-10-CM | POA: Insufficient documentation

## 2018-12-08 DIAGNOSIS — E119 Type 2 diabetes mellitus without complications: Secondary | ICD-10-CM | POA: Insufficient documentation

## 2018-12-08 DIAGNOSIS — H9201 Otalgia, right ear: Secondary | ICD-10-CM | POA: Diagnosis present

## 2018-12-08 MED ORDER — FLUCONAZOLE 150 MG PO TABS: 150.0000 mg | ORAL_TABLET | Freq: Every day | ORAL | 0 refills | Status: AC

## 2018-12-08 MED ORDER — FLUCONAZOLE 150 MG PO TABS: 150.0000 mg | ORAL_TABLET | Freq: Every day | ORAL | 0 refills | Status: DC

## 2018-12-08 MED ORDER — IBUPROFEN 200 MG PO TABS
600.0000 mg | ORAL_TABLET | Freq: Once | ORAL | Status: AC
  Administered 2018-12-08: 600 mg via ORAL
  Filled 2018-12-08: qty 3

## 2018-12-08 MED ORDER — ACETAMINOPHEN 500 MG PO TABS
1000.0000 mg | ORAL_TABLET | Freq: Once | ORAL | Status: AC
  Administered 2018-12-08: 1000 mg via ORAL
  Filled 2018-12-08: qty 2

## 2018-12-08 MED ORDER — AMOXICILLIN 500 MG PO CAPS: 500.0000 mg | ORAL_CAPSULE | Freq: Three times a day (TID) | ORAL | 0 refills | Status: DC

## 2018-12-08 MED ORDER — AMOXICILLIN 500 MG PO CAPS
500.0000 mg | ORAL_CAPSULE | Freq: Once | ORAL | Status: AC
  Administered 2018-12-08: 500 mg via ORAL
  Filled 2018-12-08: qty 1

## 2018-12-08 MED FILL — NovoLOG 100 UNIT/ML SOLN: 100 | 30 days supply | Qty: 30 | Fill #1

## 2018-12-08 MED FILL — AMOXICILLIN 500 MG CAPSULE: 500 | 7 days supply | Qty: 21 | Fill #0

## 2018-12-08 MED FILL — FLUCONAZOLE 150 MG TABS: 150 | 5 days supply | Qty: 5 | Fill #0

## 2018-12-08 NOTE — ED Triage Notes (Signed)
Patient c/o right ear swelling, right facial and lymph node swelling x 1 week tht has been getting progressively worse.

## 2018-12-08 NOTE — ED Provider Notes (Signed)
[facility]-EMERGENCY DEPT Provider Note   CSN: [id number] Arrival date & time: 12/08/18  0831     History   Chief Complaint Chief Complaint  Patient presents with  . Otalgia  . Facial Swelling  . lymph node swelling    HPI Sarah Garrison is a 38 y.o. female.     HPI Patient is a 38 year old female presents the emergency department with complaints of right ear pain right-sided facial swelling and right neck lymph node swelling.  She states this is been progressive over the past week.  She is tried ibuprofen without improvement in her symptoms.  She is allergic to hydrocodone and oxycodone.  She is had no fevers or chills.  Denies sore throat.  No recent sick contacts.  No other complaints.  Her symptoms are moderate in severity.   Past Medical History:  Diagnosis Date  . Asthma   . Diabetes mellitus without complication (HCC)   . GERD (gastroesophageal reflux disease)   . Hypertension     Patient Active Problem List   Diagnosis Date Noted  . Essential hypertension 06/22/2018  . Mixed hyperlipidemia 06/22/2018  . Uncontrolled type 2 diabetes mellitus with complication, with long-term current use of insulin (HCC) 12/13/2016  . Non-adherence to medical treatment 12/13/2016  . Asthma 07/30/2016  . 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/07/2016  . Encounter for pregnancy test 02/01/2016  . Leg swelling 09/29/2015  . Morbid obesity, unspecified obesity type (HCC) 09/29/2015  . DM neuropathy, type II diabetes mellitus (HCC) 08/24/2015    Past Surgical History:  Procedure Laterality Date  . CESAREAN SECTION    . HERNIA REPAIR    . TUBAL LIGATION    . VENTRAL HERNIA REPAIR N/A 11/27/2017   Procedure: VENTRAL HERNIA REPAIR ERAS PATHWAY;  Surgeon: Harriette Bouillonornett, Thomas, MD;  Location: [facility];  Service: General;  Laterality: N/A;  . WISDOM TOOTH EXTRACTION       OB History    Gravida  5   Para  4   Term      Preterm      AB  1   Living  4     SAB  1   TAB      Ectopic      Multiple      Live Births               Home Medications    Prior to Admission medications   Medication Sig Start Date End Date Taking? Authorizing Provider  ACCU-CHEK FASTCLIX LANCETS MISC Test 4 times daily 03/03/18   Reather LittlerKumar, Ajay, MD  albuterol (PROVENTIL HFA;VENTOLIN HFA) 108 (90 Base) MCG/ACT inhaler Inhale 1-2 puffs into the lungs every 6 (six) hours as needed for wheezing or shortness of breath. 05/14/18   Claiborne RiggFleming, Zelda W, NP  amoxicillin (AMOXIL) 500 MG capsule Take 1 capsule (500 mg total) by mouth 3 (three) times daily. 12/08/18   Azalia Bilisampos, Blessed Cotham, MD  atorvastatin (LIPITOR) 20 MG tablet Take 1 tablet (20 mg total) by mouth daily. 11/23/17   Claiborne RiggFleming, Zelda W, NP  calcium carbonate (TUMS) 500 MG chewable tablet Chew 2-3 tablets by mouth as directed.    [provider]  fluconazole (DIFLUCAN) 150 MG tablet Take 1 tablet (150 mg total) by mouth daily for 5 days. 12/08/18 12/13/18  Azalia Bilisampos, Tom Macpherson, MD  glucose blood (ACCU-CHEK GUIDE) test strip Test 4 times daily. 03/03/18   Reather LittlerKumar, Ajay, MD  hydrocortisone cream (MONISTAT SOOTHING CARE ITCH) 1 % Apply 1 application topically  2 (two) times daily. 05/19/18   Claiborne RiggFleming, Zelda W, NP  ibuprofen (ADVIL) 800 MG tablet Take 1 tablet (800 mg total) by mouth 3 (three) times daily. 10/24/18   Georgetta HaberBurky, Natalie B, NP  insulin aspart (NOVOLOG FLEXPEN) 100 UNIT/ML FlexPen Inject 10 Units into the skin 3 (three) times daily. 06/16/18   [provider]  Insulin Human (INSULIN PUMP) SOLN Inject into the skin. Novolog Basal and Bolus    [provider]  lisinopril (PRINIVIL,ZESTRIL) 20 MG tablet Take 1 tablet (20 mg total) by mouth daily. 01/09/18   Anders SimmondsMcClung, Angela M, PA-C  loperamide (IMODIUM A-D) 2 MG tablet Take 1 tablet (2 mg total) by mouth 4 (four) times daily as needed for diarrhea or loose stools. 05/14/18   Claiborne RiggFleming, Zelda W, NP  medroxyPROGESTERone Acetate (DEPO-PROVERA IM) Inject into the muscle.     [provider]  metoCLOPramide (REGLAN) 5 MG tablet Take 1 tablet (5 mg total) by mouth 2 (two) times daily. Take at breakfast and bedtime 06/18/18   Sherrilyn Ristanis, Henry L III, MD  nystatin (NYSTATIN) powder Apply topically 3 (three) times daily. 04/21/18   Hoy RegisterNewlin, Enobong, MD  omeprazole (PRILOSEC) 40 MG capsule Take 1 capsule (40 mg total) by mouth daily. 06/18/18   Sherrilyn Ristanis, Henry L III, MD  trimethoprim-polymyxin b (POLYTRIM) ophthalmic solution Place 2 drops into the affected eye 4 (four) times per day for 10 (ten) days. 10/06/18   Claiborne RiggFleming, Zelda W, NP  TRUEPLUS PEN NEEDLES 32G X 4 MM MISC USE 3 TIMES DAILY AS DIRECTED 06/16/18   Hoy RegisterNewlin, Enobong, MD    Family History Family History  Problem Relation Age of Onset  . Asthma Mother   . Kidney failure Mother   . Brain cancer Mother   . Asthma Father   . Other Father        surgery on stomach but don't know from what  . Diabetes Brother   . Diabetes Maternal Grandmother     Social History Social History   Tobacco Use  . Smoking status: Former Smoker    Packs/day: 0.10    Types: Cigarettes    Quit date: 11/27/2017    Years since quitting: 1.0  . Smokeless tobacco: Never Used  Substance Use Topics  . Alcohol use: No  . Drug use: No     Allergies   Morphine and related, Glyburide, Ivp dye [iodinated diagnostic agents], Oxycodone, and Vicodin [hydrocodone-acetaminophen]   Review of Systems Review of Systems  All other systems reviewed and are negative.    Physical Exam Updated Vital Signs BP (!) 150/100 (BP Location: Right Arm)   Pulse 97   Temp 98.6 F (37 C) (Oral)   Resp 20   Ht 5\' 4"  (1.626 m)   Wt 95.3 kg   SpO2 99%   BMI 36.05 kg/m   Physical Exam Vitals signs and nursing note reviewed.  Constitutional:      Appearance: She is well-developed.  HENT:     Head: Normocephalic.     Comments: Dentition normal.  No dental caries.  Posterior pharynx normal.  Uvula midline.  Tolerating secretions.  Oral airway  patent.  Space under her tongue is soft.  Anterior neck is normal.  Mild lymphadenopathy on the right.  No supraclavicular nodes noted on the right.  No significant right-sided facial swelling appreciated.  Right TM bulging and erythema consistent with acute otitis media Neck:     Musculoskeletal: Normal range of motion.  Pulmonary:     Effort:  Pulmonary effort is normal.  Abdominal:     General: There is no distension.  Musculoskeletal: Normal range of motion.  Neurological:     Mental Status: She is alert and oriented to person, place, and time.      ED Treatments / Results  Labs (all labs ordered are listed, but only abnormal results are displayed) Labs Reviewed - No data to display  EKG None  Radiology No results found.  Procedures Procedures (including critical care time)  Medications Ordered in ED Medications  ibuprofen (ADVIL) tablet 600 mg (has no administration in time range)  acetaminophen (TYLENOL) tablet 1,000 mg (has no administration in time range)  amoxicillin (AMOXIL) capsule 500 mg (has no administration in time range)     Initial Impression / Assessment and Plan / ED Course  I have reviewed the triage vital signs and the nursing notes.  Pertinent labs & imaging results that were available during my care of the patient were reviewed by me and considered in my medical decision making (see chart for details).       Acute otitis media with associated right-sided neck lymphadenopathy.  Home with instructions for ibuprofen and Tylenol for pain and amoxicillin.  Patient requesting Diflucan given antibiotic use which normally results in yeast infection for the patient.  Primary care follow-up.  Patient understands return to the ER for new or worsening symptoms  Final Clinical Impressions(s) / ED Diagnoses   Final diagnoses:  Other non-recurrent acute nonsuppurative otitis media of right ear    ED Discharge Orders         Ordered    amoxicillin (AMOXIL)  500 MG capsule  3 times daily     12/08/18 0918    fluconazole (DIFLUCAN) 150 MG tablet  Daily     12/08/18 0918           Jola Schmidt, MD 12/08/18 [phone]

## 2018-12-18 ENCOUNTER — Telehealth: Payer: Self-pay | Admitting: Nurse Practitioner

## 2018-12-18 ENCOUNTER — Encounter: Payer: Self-pay | Admitting: Nurse Practitioner

## 2018-12-18 NOTE — Telephone Encounter (Signed)
Patient Called requesting that her appointment be in person. Patient was informed that her PCP has requested it be a tele-visit. Patient was upset and stated she will be coming in person any way to be seen and that the only way she would leave the premises was if the authorities were called. Please follow up.

## 2018-12-18 NOTE — Telephone Encounter (Signed)
Patient can be evaluated via Mychart. Thank you.

## 2018-12-18 NOTE — Telephone Encounter (Signed)
Pt called crying stating that she wants to be seen in person, pt was complaining of ear pain, says her face is swollen as well as her neck..please follow up

## 2018-12-18 NOTE — Telephone Encounter (Signed)
CMA will route to PCP to notified Patient insist to come in for her ear pain and swelling of her face. CMA did offer patient a MyChart Visit as well. Patient denied the offer and insist to come in for the visit.

## 2018-12-19 ENCOUNTER — Other Ambulatory Visit: Payer: Self-pay

## 2018-12-19 ENCOUNTER — Ambulatory Visit: Payer: Medicaid Other | Attending: Nurse Practitioner | Admitting: Nurse Practitioner

## 2018-12-19 ENCOUNTER — Encounter: Payer: Self-pay | Admitting: Nurse Practitioner

## 2018-12-19 DIAGNOSIS — K219 Gastro-esophageal reflux disease without esophagitis: Secondary | ICD-10-CM | POA: Diagnosis not present

## 2018-12-19 DIAGNOSIS — Z79899 Other long term (current) drug therapy: Secondary | ICD-10-CM | POA: Insufficient documentation

## 2018-12-19 DIAGNOSIS — Z87891 Personal history of nicotine dependence: Secondary | ICD-10-CM | POA: Insufficient documentation

## 2018-12-19 DIAGNOSIS — H6691 Otitis media, unspecified, right ear: Secondary | ICD-10-CM | POA: Insufficient documentation

## 2018-12-19 DIAGNOSIS — E119 Type 2 diabetes mellitus without complications: Secondary | ICD-10-CM | POA: Diagnosis not present

## 2018-12-19 DIAGNOSIS — J302 Other seasonal allergic rhinitis: Secondary | ICD-10-CM | POA: Diagnosis not present

## 2018-12-19 DIAGNOSIS — H6090 Unspecified otitis externa, unspecified ear: Secondary | ICD-10-CM | POA: Diagnosis not present

## 2018-12-19 DIAGNOSIS — G4733 Obstructive sleep apnea (adult) (pediatric): Secondary | ICD-10-CM | POA: Diagnosis not present

## 2018-12-19 DIAGNOSIS — J4540 Moderate persistent asthma, uncomplicated: Secondary | ICD-10-CM

## 2018-12-19 DIAGNOSIS — I10 Essential (primary) hypertension: Secondary | ICD-10-CM | POA: Diagnosis not present

## 2018-12-19 DIAGNOSIS — R220 Localized swelling, mass and lump, head: Secondary | ICD-10-CM | POA: Diagnosis not present

## 2018-12-19 DIAGNOSIS — Z825 Family history of asthma and other chronic lower respiratory diseases: Secondary | ICD-10-CM | POA: Diagnosis not present

## 2018-12-19 DIAGNOSIS — R590 Localized enlarged lymph nodes: Secondary | ICD-10-CM | POA: Insufficient documentation

## 2018-12-19 DIAGNOSIS — Z833 Family history of diabetes mellitus: Secondary | ICD-10-CM | POA: Insufficient documentation

## 2018-12-19 MED ORDER — CEFDINIR 300 MG PO CAPS: 300.0000 mg | ORAL_CAPSULE | Freq: Two times a day (BID) | ORAL | 0 refills | Status: DC

## 2018-12-19 MED ORDER — LIDOCAINE VISCOUS HCL 2 % MT SOLN: 15.0000 mL | OROMUCOSAL | 1 refills | Status: AC | PRN

## 2018-12-19 MED ORDER — LISINOPRIL 20 MG PO TABS: 20.0000 mg | ORAL_TABLET | Freq: Every day | ORAL | 3 refills | Status: DC

## 2018-12-19 MED ORDER — CETIRIZINE HCL 10 MG PO TABS: 10.0000 mg | ORAL_TABLET | Freq: Every day | ORAL | 11 refills | Status: DC

## 2018-12-19 MED ORDER — MONTELUKAST SODIUM 10 MG PO TABS: 10.0000 mg | ORAL_TABLET | Freq: Every day | ORAL | 3 refills | Status: DC

## 2018-12-19 MED ORDER — DULERA 100-5 MCG/ACT IN AERO: 2.0000 | INHALATION_SPRAY | Freq: Two times a day (BID) | RESPIRATORY_TRACT | 11 refills | Status: DC

## 2018-12-19 MED FILL — LIDOCAINE 2% VISCOUS SOLN: 2 | 7 days supply | Qty: 100 | Fill #0

## 2018-12-19 MED FILL — !DULERA 100 MCG/5 MCG INH: 100-5 | 30 days supply | Qty: 13 | Fill #0

## 2018-12-19 MED FILL — CEFDINIR 300 MG CAPSULE: 300 | 10 days supply | Qty: 20 | Fill #0

## 2018-12-19 MED FILL — MONTELUKAST SOD 10 MG TAB: 10 | 30 days supply | Qty: 30 | Fill #0

## 2018-12-19 NOTE — Telephone Encounter (Signed)
Patient had a televisit with PCP.

## 2018-12-19 NOTE — Progress Notes (Signed)
Virtual Visit via Telephone Note Due to national recommendations of social distancing due to [place] 19, telehealth visit is felt to be most appropriate for this patient at this time.  I discussed the limitations, risks, security and privacy concerns of performing an evaluation and management service by telephone and the availability of in person appointments. I also discussed with the patient that there may be a patient responsible charge related to this service. The patient expressed understanding and agreed to proceed.    I connected with Sarah Garrison on [birth year]  at   9:30 AM EDT  EDT by telephone and verified that I am speaking with the correct person using two identifiers.   Consent I discussed the limitations, risks, security and privacy concerns of performing an evaluation and management service by telephone and the availability of in person appointments. I also discussed with the patient that there may be a patient responsible charge related to this service. The patient expressed understanding and agreed to proceed.   Location of Patient: Private Residence   Location of Provider: [place] and [place] participating in Telemedicine visit: Geryl Rankins FNP-BC [place]    History of Present Illness: Telemedicine visit for: Recurrent Otitis Media with right neck lymphadenopathy. [name] complaints of significant right ear pain and pain with swallowing due to right sided lymph node swelling of neck. She was treated with amoxicillin and prophylactic diflucan in the ED on 12-08-2018 for acute otitis media of the right ear. She complained of right sided facial swelling at that time however per ED note her face was not noted to be swollen upon examination.  She refused a mychart/webex visit with me today for visual evaluation. States ear pain 10/10. Endorses persistent right sided facial swelling and describes ear pain as throbbing.  Will treat with cefdinir today, refer to ENT, Xray of sinuses. Denies any drainage: purulent or blood from the ear. She also has a history of allergies and pharyngitis.   Past Medical History:  Diagnosis Date  . Asthma   . Diabetes mellitus without complication ([place])   . GERD (gastroesophageal reflux disease)   . Hypertension     Past Surgical History:  Procedure Laterality Date  . CESAREAN SECTION    . HERNIA REPAIR    . TUBAL LIGATION    . VENTRAL HERNIA REPAIR N/A 11/27/2017   Procedure: [place];  Surgeon: Erroll Luna, MD;  Location: [facility];  Service: General;  Laterality: N/A;  . WISDOM TOOTH EXTRACTION      Family History  Problem Relation Age of Onset  . Asthma Mother   . Kidney failure Mother   . Brain cancer Mother   . Asthma Father   . Other Father        surgery on stomach but don't know from what  . Diabetes Brother   . Diabetes Maternal Grandmother     Social History   Socioeconomic History  . Marital status: Single    Spouse name: Not on file  . Number of children: 4  . Years of education: Not on file  . Highest education level: Not on file  Occupational History  . Not on file  Social Needs  . Financial resource strain: Not on file  . Food insecurity    Worry: Not on file    Inability: Not on file  . Transportation needs    Medical: Not on file  Non-medical: Not on file  Tobacco Use  . Smoking status: Former Smoker    Packs/day: 0.10    Types: Cigarettes    Quit date: 11/27/2017    Years since quitting: 1.0  . Smokeless tobacco: Never Used  Substance and Sexual Activity  . Alcohol use: No  . Drug use: No  . Sexual activity: Not on file  Lifestyle  . Physical activity    Days per week: Not on file    Minutes per session: Not on file  . Stress: Not on file  Relationships  . Social Musicianconnections    Talks on phone: Not on file    Gets together: Not on file    Attends religious service: Not on  file    Active member of club or organization: Not on file    Attends meetings of clubs or organizations: Not on file    Relationship status: Not on file  Other Topics Concern  . Not on file  Social History Narrative  . Not on file     Observations/Objective: Awake, alert and oriented x 3   Review of Systems  Constitutional: Negative for fever, malaise/fatigue and weight loss.  HENT: Positive for congestion, ear pain and sore throat. Negative for ear discharge, hearing loss, nosebleeds and tinnitus.        SEE HPI  Eyes: Negative.  Negative for blurred vision, double vision and photophobia.  Respiratory: Positive for shortness of breath (endorses shortness of breath at night that wakes her up out of her sleep.  Excessive use of albuterol inhaler multiple times per day). Negative for cough and stridor.   Cardiovascular: Positive for chest pain. Negative for palpitations and leg swelling.  Gastrointestinal: Negative.  Negative for heartburn, nausea and vomiting.  Musculoskeletal: Negative.  Negative for myalgias.  Neurological: Negative.  Negative for dizziness, focal weakness, seizures and headaches.  Endo/Heme/Allergies: Positive for environmental allergies.  Psychiatric/Behavioral: Negative.  Negative for suicidal ideas.    Assessment and Plan:   Diagnoses and all orders for this visit:  Recurrent otitis externa, unspecified laterality -     Ambulatory referral to ENT -     lidocaine (XYLOCAINE) 2 % solution; Use as directed 15 mLs in the mouth or throat as needed for up to 7 days for mouth pain. -     cefdinir (OMNICEF) 300 MG capsule; Take 1 capsule (300 mg total) by mouth 2 (two) times daily for 10 days. -     DG Sinuses Complete; Future  Obstructive sleep apnea -     Split night study; Future  Essential hypertension -     lisinopril (ZESTRIL) 20 MG tablet; Take 1 tablet (20 mg total) by mouth daily. Continue all antihypertensives as prescribed.  Remember to bring in  your blood pressure log with you for your follow up appointment.  DASH/Mediterranean Diets are healthier choices for HTN.  BP Readings from Last 3 Encounters:  12/08/18 (!) 150/100  10/24/18 (!) 152/101  08/24/18 (!) 143/95  POORLY CONTROLLED  Swelling of right side of face -     DG Sinuses Complete; Future  Seasonal allergies -     montelukast (SINGULAIR) 10 MG tablet; Take 1 tablet (10 mg total) by mouth at bedtime. Patient will pick up scripts today. -     cetirizine (ZYRTEC) 10 MG tablet; Take 1 tablet (10 mg total) by mouth daily.  Moderate persistent asthma, unspecified whether complicated -     montelukast (SINGULAIR) 10 MG tablet; Take 1 tablet (10  mg total) by mouth at bedtime. Patient will pick up scripts today. -     mometasone-formoterol (DULERA) 100-5 MCG/ACT AERO; Inhale 2 puffs into the lungs 2 (two) times a day. Patient will pick up scripts today.     Follow Up Instructions Return in about 3 weeks (around 01/09/2019) for asthma.     I discussed the assessment and treatment plan with the patient. The patient was provided an opportunity to ask questions and all were answered. The patient agreed with the plan and demonstrated an understanding of the instructions.   The patient was advised to call back or seek an in-person evaluation if the symptoms worsen or if the condition fails to improve as anticipated.  I provided 26 minutes of non-face-to-face time during this encounter including median intraservice time, reviewing previous notes, labs, imaging, medications and explaining diagnosis and management.  Claiborne RiggZelda W Sorcha Rotunno, FNP-BC

## 2018-12-20 ENCOUNTER — Emergency Department (HOSPITAL_COMMUNITY): Payer: Medicaid Other

## 2018-12-20 ENCOUNTER — Emergency Department (HOSPITAL_COMMUNITY)
Admission: EM | Admit: 2018-12-20 | Discharge: 2018-12-20 | Disposition: A | Discharge status: Home / Self Care | Payer: Medicaid Other | Attending: Emergency Medicine | Admitting: Emergency Medicine

## 2018-12-20 ENCOUNTER — Other Ambulatory Visit: Payer: Self-pay

## 2018-12-20 ENCOUNTER — Encounter (HOSPITAL_COMMUNITY): Payer: Self-pay | Admitting: Emergency Medicine

## 2018-12-20 DIAGNOSIS — Z87891 Personal history of nicotine dependence: Secondary | ICD-10-CM | POA: Insufficient documentation

## 2018-12-20 DIAGNOSIS — H60501 Unspecified acute noninfective otitis externa, right ear: Secondary | ICD-10-CM | POA: Insufficient documentation

## 2018-12-20 DIAGNOSIS — J45909 Unspecified asthma, uncomplicated: Secondary | ICD-10-CM | POA: Diagnosis not present

## 2018-12-20 DIAGNOSIS — E119 Type 2 diabetes mellitus without complications: Secondary | ICD-10-CM | POA: Insufficient documentation

## 2018-12-20 DIAGNOSIS — I10 Essential (primary) hypertension: Secondary | ICD-10-CM | POA: Insufficient documentation

## 2018-12-20 DIAGNOSIS — Z79899 Other long term (current) drug therapy: Secondary | ICD-10-CM | POA: Insufficient documentation

## 2018-12-20 DIAGNOSIS — Z794 Long term (current) use of insulin: Secondary | ICD-10-CM | POA: Insufficient documentation

## 2018-12-20 DIAGNOSIS — H9201 Otalgia, right ear: Secondary | ICD-10-CM | POA: Diagnosis present

## 2018-12-20 LAB — I-STAT BETA HCG BLOOD, ED (MC, WL, AP ONLY): I-stat hCG, quantitative: <5 m[IU]/mL (ref <5)

## 2018-12-20 LAB — CBC WITH DIFFERENTIAL/PLATELET
Abs Immature Granulocytes: 0.02 10*3/uL (ref 0.00–0.07)
Basophils Absolute: 0 10*3/uL (ref 0.0–0.1)
Basophils Relative: 1 %
Eosinophils Absolute: 0 10*3/uL (ref 0.0–0.5)
Eosinophils Relative: 0 %
HCT: 46.3 % — ABNORMAL HIGH (ref 36.0–46.0)
Hemoglobin: 15.3 g/dL — ABNORMAL HIGH (ref 12.0–15.0)
Immature Granulocytes: 0 %
Lymphocytes Relative: 41 %
Lymphs Abs: 3 10*3/uL (ref 0.7–4.0)
MCH: 29.4 pg (ref 26.0–34.0)
MCHC: 33 g/dL (ref 30.0–36.0)
MCV: 88.9 fL (ref 80.0–100.0)
Monocytes Absolute: 0.5 10*3/uL (ref 0.1–1.0)
Monocytes Relative: 7 %
Neutro Abs: 3.8 10*3/uL (ref 1.7–7.7)
Neutrophils Relative %: 51 %
Platelets: 317 10*3/uL (ref 150–400)
RBC: 5.21 MIL/uL — ABNORMAL HIGH (ref 3.87–5.11)
RDW: 11.8 % (ref 11.5–15.5)
WBC: 7.4 10*3/uL (ref 4.0–10.5)
nRBC: 0 % (ref 0.0–0.2)

## 2018-12-20 LAB — BASIC METABOLIC PANEL
Anion gap: 11 (ref 5–15)
BUN: 8 mg/dL (ref 6–20)
CO2: 18 mmol/L — ABNORMAL LOW (ref 22–32)
Calcium: 9.3 mg/dL (ref 8.9–10.3)
Chloride: 105 mmol/L (ref 98–111)
Creatinine, Ser: 0.52 mg/dL (ref 0.44–1.00)
GFR calc Af Amer: >60 mL/min (ref >60)
GFR calc non Af Amer: >60 mL/min (ref >60)
Glucose, Bld: 259 mg/dL — ABNORMAL HIGH (ref 70–99)
Potassium: 4.2 mmol/L (ref 3.5–5.1)
Sodium: 134 mmol/L — ABNORMAL LOW (ref 135–145)

## 2018-12-20 MED ORDER — IBUPROFEN 400 MG PO TABS
600.0000 mg | ORAL_TABLET | Freq: Once | ORAL | Status: AC
  Administered 2018-12-20: 600 mg via ORAL
  Filled 2018-12-20: qty 1

## 2018-12-20 MED ORDER — OFLOXACIN 0.3 % OT SOLN: 10.0000 [drp] | Freq: Every day | OTIC | 0 refills | Status: DC

## 2018-12-20 NOTE — Discharge Instructions (Addendum)
You were seen in the ED today for right ear pain Your CT scan showed some narrowing in your ear canal I have sent a prescription for ear drops to use Please continue taking your oral antibiotics as well as your ear drum is also mildly red  Please follow up with your PCP Continue taking Ibuprofen and Tylenol as needed for pain

## 2018-12-20 NOTE — ED Provider Notes (Signed)
[facility] EMERGENCY DEPARTMENT Provider Note   CSN: [id number] Arrival date & time: 12/20/18  9390    History   Chief Complaint Chief Complaint  Patient presents with  . Otalgia    HPI Sarah Garrison is a 38 y.o. female with PMHx diabetes, HTN, GERD, and asthma who presents to the ED today for "sinus x rays." Pt reports she was seen at [facility] on 06/07 for right ear pain and right sided facial swelling. No facial swelling was noted by provider at that time. She was diagnosed with AOM and discharged home with Amoxicillin. Pt reports her pain and swelling have gotten worse since then. She had a telemedicine visit with her PCP yesterday who prescribed Cefdinir, gave her an ambulatory referral to ENT, and ordered DG Sinus x rays. Pt came to the main hospital today for radiology and was sent to the ED due to radiology department being closed. She has been taking Ibuprofen and Tylenol without relief. Denies fever, chills, ear discharge, dental pain, foul taste in mouth, drooling, sore throat, headache, vision changes, sinus pressure, or any other associated symptoms.        Past Medical History:  Diagnosis Date  . Asthma   . Diabetes mellitus without complication ([place])   . GERD (gastroesophageal reflux disease)   . Hypertension     Patient Active Problem List   Diagnosis Date Noted  . Essential hypertension 06/22/2018  . Mixed hyperlipidemia 06/22/2018  . Uncontrolled type 2 diabetes mellitus with complication, with long-term current use of insulin ([place]) 12/13/2016  . Non-adherence to medical treatment 12/13/2016  . Asthma 07/30/2016  . 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/07/2016  . Encounter for pregnancy test 02/01/2016  . Leg swelling 09/29/2015  . Morbid obesity, unspecified obesity type ([place]) 09/29/2015  . DM neuropathy, type II diabetes mellitus ([place]) 08/24/2015    Past Surgical History:  Procedure Laterality Date  . CESAREAN SECTION    . HERNIA REPAIR    .  TUBAL LIGATION    . VENTRAL HERNIA REPAIR N/A 11/27/2017   Procedure: [place];  Surgeon: Erroll Luna, MD;  Location: [facility];  Service: General;  Laterality: N/A;  . WISDOM TOOTH EXTRACTION       OB History    Gravida  5   Para  4   Term      Preterm      AB  1   Living  4     SAB  1   TAB      Ectopic      Multiple      Live Births               Home Medications    Prior to Admission medications   Medication Sig Start Date End Date Taking? Authorizing Provider  ACCU-CHEK FASTCLIX LANCETS MISC Test 4 times daily 03/03/18   Elayne Snare, MD  albuterol (PROVENTIL HFA;VENTOLIN HFA) 108 (90 Base) MCG/ACT inhaler Inhale 1-2 puffs into the lungs every 6 (six) hours as needed for wheezing or shortness of breath. 05/14/18   Gildardo Pounds, NP  atorvastatin (LIPITOR) 20 MG tablet Take 1 tablet (20 mg total) by mouth daily. 11/23/17   Gildardo Pounds, NP  calcium carbonate (TUMS) 500 MG chewable tablet Chew 2-3 tablets by mouth as directed.    [provider]  cefdinir (OMNICEF) 300 MG capsule Take 1 capsule (300 mg total) by mouth 2 (two) times daily for 10 days. 12/19/18  12/29/18  Claiborne RiggFleming, Zelda W, NP  cetirizine (ZYRTEC) 10 MG tablet Take 1 tablet (10 mg total) by mouth daily. 12/19/18   Claiborne RiggFleming, Zelda W, NP  glucose blood (ACCU-CHEK GUIDE) test strip Test 4 times daily. 03/03/18   Reather LittlerKumar, Ajay, MD  hydrocortisone cream (MONISTAT SOOTHING CARE ITCH) 1 % Apply 1 application topically 2 (two) times daily. 05/19/18   Claiborne RiggFleming, Zelda W, NP  ibuprofen (ADVIL) 800 MG tablet Take 1 tablet (800 mg total) by mouth 3 (three) times daily. 10/24/18   Georgetta HaberBurky, Natalie B, NP  insulin aspart (NOVOLOG FLEXPEN) 100 UNIT/ML FlexPen Inject 10 Units into the skin 3 (three) times daily. 06/16/18   [provider]  Insulin Human (INSULIN PUMP) SOLN Inject into the skin. Novolog Basal and Bolus    [provider]  lidocaine  (XYLOCAINE) 2 % solution Use as directed 15 mLs in the mouth or throat as needed for up to 7 days for mouth pain. 12/19/18 12/26/18  Claiborne RiggFleming, Zelda W, NP  lisinopril (ZESTRIL) 20 MG tablet Take 1 tablet (20 mg total) by mouth daily. 12/19/18   Claiborne RiggFleming, Zelda W, NP  loperamide (IMODIUM A-D) 2 MG tablet Take 1 tablet (2 mg total) by mouth 4 (four) times daily as needed for diarrhea or loose stools. 05/14/18   Claiborne RiggFleming, Zelda W, NP  medroxyPROGESTERone Acetate (DEPO-PROVERA IM) Inject into the muscle.    [provider]  metoCLOPramide (REGLAN) 5 MG tablet Take 1 tablet (5 mg total) by mouth 2 (two) times daily. Take at breakfast and bedtime 06/18/18   Sherrilyn Ristanis, Henry L III, MD  mometasone-formoterol (DULERA) 100-5 MCG/ACT AERO Inhale 2 puffs into the lungs 2 (two) times a day. Patient will pick up scripts today. 12/19/18 01/18/19  Claiborne RiggFleming, Zelda W, NP  montelukast (SINGULAIR) 10 MG tablet Take 1 tablet (10 mg total) by mouth at bedtime. Patient will pick up scripts today. 12/19/18 03/19/19  Claiborne RiggFleming, Zelda W, NP  nystatin (NYSTATIN) powder Apply topically 3 (three) times daily. 04/21/18   Hoy RegisterNewlin, Enobong, MD  ofloxacin (FLOXIN) 0.3 % OTIC solution Place 10 drops into the right ear daily for 7 days. 12/20/18 12/27/18  Tanda RockersVenter, Emanii Bugbee, PA-C  omeprazole (PRILOSEC) 40 MG capsule Take 1 capsule (40 mg total) by mouth daily. 06/18/18   Sherrilyn Ristanis, Henry L III, MD  trimethoprim-polymyxin b (POLYTRIM) ophthalmic solution Place 2 drops into the affected eye 4 (four) times per day for 10 (ten) days. 10/06/18   Claiborne RiggFleming, Zelda W, NP  TRUEPLUS PEN NEEDLES 32G X 4 MM MISC USE 3 TIMES DAILY AS DIRECTED 06/16/18   Hoy RegisterNewlin, Enobong, MD    Family History Family History  Problem Relation Age of Onset  . Asthma Mother   . Kidney failure Mother   . Brain cancer Mother   . Asthma Father   . Other Father        surgery on stomach but don't know from what  . Diabetes Brother   . Diabetes Maternal Grandmother     Social History  Social History   Tobacco Use  . Smoking status: Former Smoker    Packs/day: 0.10    Types: Cigarettes    Quit date: 11/27/2017    Years since quitting: 1.0  . Smokeless tobacco: Never Used  Substance Use Topics  . Alcohol use: No  . Drug use: No     Allergies   Morphine and related, Glyburide, Ivp dye [iodinated diagnostic agents], Oxycodone, and Vicodin [hydrocodone-acetaminophen]   Review of Systems Review of Systems  Constitutional: Negative for chills and fever.  HENT: Positive for ear pain and facial swelling. Negative for ear discharge, postnasal drip, rhinorrhea, sinus pressure, sinus pain, sneezing and sore throat.   Respiratory: Negative for shortness of breath.      Physical Exam Updated Vital Signs BP (!) 137/98 (BP Location: Left Arm)   Pulse 95   Temp 98.7 F (37.1 C) (Oral)   Resp 14   SpO2 97%   Physical Exam Vitals signs and nursing note reviewed.  Constitutional:      Appearance: She is not ill-appearing.  HENT:     Head: Normocephalic and atraumatic.     Comments: No facial swelling appreciated. Pt with exquisite tenderness with light touch to anterior aspect of right ear along TMJ. Pt writhing in pain during otoscope exam. Right TM mildly erythematous but no bulging TM appreciated. External auditory canal without erythema or edema. No ludwigs angina. No drooling. No trismus. Left TM clear.     Left Ear: Tympanic membrane normal.  Eyes:     Conjunctiva/sclera: Conjunctivae normal.  Cardiovascular:     Rate and Rhythm: Normal rate and regular rhythm.  Pulmonary:     Effort: Pulmonary effort is normal.     Breath sounds: Normal breath sounds.  Skin:    General: Skin is warm and dry.     Coloration: Skin is not jaundiced.  Neurological:     Mental Status: She is alert.      ED Treatments / Results  Labs (all labs ordered are listed, but only abnormal results are displayed) Labs Reviewed  BASIC METABOLIC PANEL - Abnormal; Notable for the  following components:      Result Value   Sodium 134 (*)    CO2 18 (*)    Glucose, Bld 259 (*)    All other components within normal limits  CBC WITH DIFFERENTIAL/PLATELET - Abnormal; Notable for the following components:   RBC 5.21 (*)    Hemoglobin 15.3 (*)    HCT 46.3 (*)    All other components within normal limits  I-STAT BETA HCG BLOOD, ED ([facility], [facility], [facility] ONLY)    EKG None  Radiology Ct Soft Tissue Neck Wo Contrast  Result Date: 12/20/2018 CLINICAL DATA:  Right ear infection treated with antibiotics 2 weeks ago. Persistent and worsening pain. EXAM: CT NECK WITHOUT CONTRAST TECHNIQUE: Multidetector CT imaging of the neck was performed following the standard protocol without intravenous contrast. COMPARISON:  10/16/2016 FINDINGS: Pharynx and larynx: No mucosal or submucosal lesion. Salivary glands: Parotid and submandibular glands are normal. Thyroid: Normal Lymph nodes: No enlarged or low-density nodes on either side of the neck. Vascular: Normal Limited intracranial: Normal Visualized orbits: Normal Mastoids and visualized paranasal sinuses: Paranasal sinuses are clear. No mastoid effusion. No fluid in either middle ear. There appears to be mild narrowing of the external auditory canal on the right, possibly due to tissue inflammation. Skeleton: Normal Upper chest: Normal Other: None IMPRESSION: Narrowing of the external auditory canal on the right, possibly due to otitis externa. No fluid in the middle ear. Mastoids are clear. No other regional soft tissue finding. Electronically Signed   By: Paulina FusiMark  Shogry M.D.   On: 12/20/2018 12:53    Procedures Procedures (including critical care time)  Medications Ordered in ED Medications  ibuprofen (ADVIL) tablet 600 mg (600 mg Oral Given 12/20/18 1156)     Initial Impression / Assessment and Plan / ED Course  I have reviewed the triage vital signs and the nursing  notes.  Pertinent labs & imaging results that were available during my care of  the patient were reviewed by me and considered in my medical decision making (see chart for details).    38 year old female presenting to the ED with continued complaints of right ear pain and right sided facial swelling with "lymph node involvement" although no swelling or lymphadenopathy appreciated on my exam. She is exquisitely tender on exam and almost unable to sit still for otoscope exam today. Pt initially presented for x rays of her sinuses ordered by her PCP. She is without any maxillary or frontal TTP; do not feel xrays are appropriate today. Would like to rule out other etiology today including soft tissue infection vs mastoiditis. Pt does not complain of any fevers or chills at home and is afebrile in the ED today. Mildly tachycardic on presentation but has returned to normal in the room. Have given Ibuprofen for pain. Will order baseline bloodwork today as well as non con CT soft tissue neck given pt has reported allergy to contrast. Do not feel she needs to be pretreated and have contrast CT scan done today. Will reevaluate once labs and imaging return.   Labwork unremarkable today. No WBC count. Electrolytes stable. CT scan with narrowing of the external canal; otherwise no acute findings. Will give pt ofloxacin drops today and advised follow up with PCP. Advised to continue taking Cefdinir as her TM is mildly erythematous. She is in agreement with plan at this time and stable for discharge home.        Final Clinical Impressions(s) / ED Diagnoses   Final diagnoses:  Acute otitis externa of right ear, unspecified type    ED Discharge Orders         Ordered    ofloxacin (FLOXIN) 0.3 % OTIC solution  Daily     12/20/18 1301           Tanda RockersVenter, Sarajane Fambrough, PA-C 12/20/18 1607    Terrilee FilesButler, Michael C, MD 12/21/18 [phone]

## 2018-12-20 NOTE — ED Triage Notes (Addendum)
Pt reports hx of R sided ear infection that she was treated with amoxicillin for 2 weeks ago, had virtual visit with [facility] today for a follow up since pain is worse and was told she needed to come here for an xray of her sinuses and given a referral to ENT. Pt also reports swelling to lymph nodes around her ear that were present at time of her initial visit for ear pain.

## 2018-12-20 NOTE — ED Notes (Signed)
Blood draw attempted. Phlebotomy will stick next.

## 2018-12-20 NOTE — ED Notes (Signed)
Patient transported to CT 

## 2018-12-23 MED FILL — LISINOPRIL 20 MG TABLET: 20 | 30 days supply | Qty: 30 | Fill #2

## 2018-12-23 MED FILL — NovoLOG 100 UNIT/ML SOLN: 100 | 30 days supply | Qty: 60 | Fill #1

## 2018-12-23 MED FILL — JANUVIA 100 MG TABLET: 100 | 30 days supply | Qty: 30 | Fill #1

## 2018-12-30 ENCOUNTER — Emergency Department (HOSPITAL_COMMUNITY): Payer: Medicaid Other

## 2018-12-30 ENCOUNTER — Emergency Department (HOSPITAL_COMMUNITY)
Admission: EM | Admit: 2018-12-30 | Discharge: 2018-12-30 | Disposition: A | Discharge status: Home / Self Care | Payer: Medicaid Other | Attending: Emergency Medicine | Admitting: Emergency Medicine

## 2018-12-30 ENCOUNTER — Other Ambulatory Visit: Payer: Self-pay

## 2018-12-30 ENCOUNTER — Encounter (HOSPITAL_COMMUNITY): Payer: Self-pay

## 2018-12-30 DIAGNOSIS — E119 Type 2 diabetes mellitus without complications: Secondary | ICD-10-CM | POA: Insufficient documentation

## 2018-12-30 DIAGNOSIS — Z87891 Personal history of nicotine dependence: Secondary | ICD-10-CM | POA: Insufficient documentation

## 2018-12-30 DIAGNOSIS — I10 Essential (primary) hypertension: Secondary | ICD-10-CM | POA: Insufficient documentation

## 2018-12-30 DIAGNOSIS — Z794 Long term (current) use of insulin: Secondary | ICD-10-CM | POA: Diagnosis not present

## 2018-12-30 DIAGNOSIS — R079 Chest pain, unspecified: Secondary | ICD-10-CM | POA: Diagnosis present

## 2018-12-30 DIAGNOSIS — J45909 Unspecified asthma, uncomplicated: Secondary | ICD-10-CM | POA: Diagnosis not present

## 2018-12-30 DIAGNOSIS — R0789 Other chest pain: Secondary | ICD-10-CM

## 2018-12-30 DIAGNOSIS — F419 Anxiety disorder, unspecified: Secondary | ICD-10-CM | POA: Insufficient documentation

## 2018-12-30 DIAGNOSIS — Z79899 Other long term (current) drug therapy: Secondary | ICD-10-CM | POA: Insufficient documentation

## 2018-12-30 LAB — CBC
HCT: 42.3 % (ref 36.0–46.0)
Hemoglobin: 14.2 g/dL (ref 12.0–15.0)
MCH: 29.7 pg (ref 26.0–34.0)
MCHC: 33.6 g/dL (ref 30.0–36.0)
MCV: 88.5 fL (ref 80.0–100.0)
Platelets: 327 10*3/uL (ref 150–400)
RBC: 4.78 MIL/uL (ref 3.87–5.11)
RDW: 11.8 % (ref 11.5–15.5)
WBC: 7.2 10*3/uL (ref 4.0–10.5)
nRBC: 0 % (ref 0.0–0.2)

## 2018-12-30 LAB — BASIC METABOLIC PANEL
Anion gap: 10 (ref 5–15)
BUN: 19 mg/dL (ref 6–20)
CO2: 21 mmol/L — ABNORMAL LOW (ref 22–32)
Calcium: 9.2 mg/dL (ref 8.9–10.3)
Chloride: 105 mmol/L (ref 98–111)
Creatinine, Ser: 0.7 mg/dL (ref 0.44–1.00)
GFR calc Af Amer: >60 mL/min (ref >60)
GFR calc non Af Amer: >60 mL/min (ref >60)
Glucose, Bld: 291 mg/dL — ABNORMAL HIGH (ref 70–99)
Potassium: 4 mmol/L (ref 3.5–5.1)
Sodium: 136 mmol/L (ref 135–145)

## 2018-12-30 LAB — TROPONIN I (HIGH SENSITIVITY): Troponin I (High Sensitivity): <2 ng/L (ref <18)

## 2018-12-30 LAB — I-STAT BETA HCG BLOOD, ED (NOT ORDERABLE): I-stat hCG, quantitative: <5 m[IU]/mL (ref <5)

## 2018-12-30 MED ORDER — KETOROLAC TROMETHAMINE 30 MG/ML IJ SOLN
30.0000 mg | Freq: Once | INTRAMUSCULAR | Status: AC
  Administered 2018-12-30: 13:00:00 30 mg via INTRAVENOUS
  Filled 2018-12-30: qty 1

## 2018-12-30 MED ORDER — SODIUM CHLORIDE 0.9% FLUSH: 3.0000 mL | Freq: Once | INTRAVENOUS | Status: DC
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
## 2018-12-30 NOTE — ED Notes (Signed)
Pt refused discharge vital signs

## 2018-12-31 ENCOUNTER — Telehealth: Payer: Self-pay

## 2018-12-31 MED FILL — TRUEPLUS PEN NDL 31G X 1/4": 31G X 6 MM | 20 days supply | Qty: 100 | Fill #0

## 2018-12-31 MED FILL — TRUEPLUS PEN NDL 31G X 1/4: 31G X 6 MM | 20 days supply | Qty: 100 | Fill #0

## 2018-12-31 NOTE — Telephone Encounter (Signed)

## 2019-01-01 ENCOUNTER — Other Ambulatory Visit: Payer: Self-pay

## 2019-01-01 ENCOUNTER — Ambulatory Visit (INDEPENDENT_AMBULATORY_CARE_PROVIDER_SITE_OTHER): Payer: Medicaid Other | Admitting: Critical Care Medicine

## 2019-01-01 ENCOUNTER — Encounter: Payer: Self-pay | Admitting: Critical Care Medicine

## 2019-01-01 VITALS — BP 117/89 | HR 106 | Temp 97.3°F | Resp 18 | Ht 65.0 in | Wt 240.6 lb

## 2019-01-01 DIAGNOSIS — I10 Essential (primary) hypertension: Secondary | ICD-10-CM

## 2019-01-01 DIAGNOSIS — M26649 Arthritis of unspecified temporomandibular joint: Secondary | ICD-10-CM | POA: Insufficient documentation

## 2019-01-01 DIAGNOSIS — E1165 Type 2 diabetes mellitus with hyperglycemia: Secondary | ICD-10-CM

## 2019-01-01 DIAGNOSIS — J4540 Moderate persistent asthma, uncomplicated: Secondary | ICD-10-CM | POA: Diagnosis not present

## 2019-01-01 DIAGNOSIS — M2669 Other specified disorders of temporomandibular joint: Secondary | ICD-10-CM

## 2019-01-01 DIAGNOSIS — H60392 Other infective otitis externa, left ear: Secondary | ICD-10-CM

## 2019-01-01 DIAGNOSIS — Z794 Long term (current) use of insulin: Secondary | ICD-10-CM

## 2019-01-01 DIAGNOSIS — Z114 Encounter for screening for human immunodeficiency virus [HIV]: Secondary | ICD-10-CM

## 2019-01-01 DIAGNOSIS — IMO0002 Reserved for concepts with insufficient information to code with codable children: Secondary | ICD-10-CM

## 2019-01-01 DIAGNOSIS — J4520 Mild intermittent asthma, uncomplicated: Secondary | ICD-10-CM

## 2019-01-01 MED ORDER — MELOXICAM 7.5 MG PO TABS: 7.5000 mg | ORAL_TABLET | Freq: Every day | ORAL | 0 refills | Status: DC

## 2019-01-01 MED ORDER — DULERA 100-5 MCG/ACT IN AERO: 2.0000 | INHALATION_SPRAY | Freq: Two times a day (BID) | RESPIRATORY_TRACT | 11 refills | Status: DC

## 2019-01-01 MED ORDER — ALBUTEROL SULFATE HFA 108 (90 BASE) MCG/ACT IN AERS: 1.0000 | INHALATION_SPRAY | Freq: Four times a day (QID) | RESPIRATORY_TRACT | 1 refills | Status: DC | PRN

## 2019-01-01 MED FILL — MELOXICAM 7.5 MG TABLET: 7.5 | 30 days supply | Qty: 30 | Fill #0

## 2019-01-01 MED FILL — ALBUTEROL SULFATE HFA 108 (: 108 (90 BAS | 25 days supply | Qty: 18 | Fill #0

## 2019-01-01 MED FILL — DULERA 100 MCG/5 MCG INH: 100-5 | 30 days supply | Qty: 13 | Fill #0

## 2019-01-01 MED FILL — ACCU-CHEK FASTCLIX LANCETS: 26 days supply | Qty: 102 | Fill #2

## 2019-01-01 NOTE — Patient Instructions (Signed)
Begin meloxicam 1 daily for inflammation in the right jaw  Continue Dulera 2 inhalations twice daily and albuterol as needed  Continue your eardrops in the right ear as prescribed  See your dentist about your mandible jaw inflammation  Follow-up with your endocrinologist about your blood sugars  We will have you return in 1 month for a morning appointment for a Pap smear

## 2019-01-01 NOTE — Assessment & Plan Note (Signed)
Evidence for external otitis for which the patient is now on antibiotic eardrops  I asked the patient to continue this for now

## 2019-01-01 NOTE — Assessment & Plan Note (Signed)
Evidence for TMJ arthritis  We will add meloxicam 7.5 mg daily and asked the patient to follow-up with ENT and the dentist

## 2019-01-01 NOTE — Assessment & Plan Note (Signed)
Mild intermittent asthma well-controlled currently on Singulair and as needed albuterol and Dulera  We will refill the [facility] and the albuterol

## 2019-01-01 NOTE — Assessment & Plan Note (Signed)
Poorly controlled asthma in the past currently now blood sugars running in the 250 range and the patient does have a primary endocrinologist  The patient does have an insulin pump and is acquiring a continuous glucose reading device  Care per endocrinology

## 2019-01-01 NOTE — Assessment & Plan Note (Signed)
Hypertension currently under good control  We will plan to continue current medication profile
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
## 2019-01-02 LAB — HIV ANTIBODY (ROUTINE TESTING W REFLEX): HIV Screen 4th Generation wRfx: NONREACTIVE

## 2019-01-05 NOTE — Progress Notes (Signed)
Patient notified of results & recommendations. Expressed understanding. Per patient request "normal lab" letter mailed.

## 2019-01-13 ENCOUNTER — Ambulatory Visit: Payer: Medicaid Other | Admitting: Nurse Practitioner

## 2019-01-16 ENCOUNTER — Telehealth: Payer: Self-pay

## 2019-01-16 ENCOUNTER — Ambulatory Visit: Payer: Medicaid Other | Attending: Nurse Practitioner | Admitting: *Deleted

## 2019-01-16 ENCOUNTER — Other Ambulatory Visit: Payer: Self-pay

## 2019-01-16 DIAGNOSIS — Z3042 Encounter for surveillance of injectable contraceptive: Secondary | ICD-10-CM | POA: Diagnosis not present

## 2019-01-16 MED ORDER — MEDROXYPROGESTERONE ACETATE 150 MG/ML IM SUSY
150.0000 mg | PREFILLED_SYRINGE | Freq: Once | INTRAMUSCULAR | Status: AC
  Administered 2019-01-16: 09:00:00 150 mg via INTRAMUSCULAR

## 2019-01-16 MED ORDER — MEDROXYPROGESTERONE ACETATE 150 MG/ML IM SUSP: 150.0000 mg | Freq: Once | INTRAMUSCULAR | Status: DC

## 2019-01-16 MED FILL — NovoLOG 100 UNIT/ML SOLN: 100 | 30 days supply | Qty: 30 | Fill #2

## 2019-01-16 NOTE — Progress Notes (Signed)
Last Depo-Provera: 10/31/2018 Side Effects if any: none Serum HCG indicated? no. Depo-Provera 150 mg IM given in  Outer Right quadrant. Next appointment due: October 30- November 13

## 2019-01-16 NOTE — Telephone Encounter (Signed)
Pt contacted the office and stated she is needing something for yeast infection   Please send rx to [facility]

## 2019-01-19 NOTE — Telephone Encounter (Signed)
Needs to come in and do her own wet prep. Thank you

## 2019-01-20 NOTE — Telephone Encounter (Signed)
Sarah Garrison may you contact pt

## 2019-01-20 NOTE — Telephone Encounter (Signed)
CMA contact patient. Patient understood and will come in to do a wet prep.

## 2019-01-28 ENCOUNTER — Other Ambulatory Visit: Payer: Self-pay

## 2019-01-28 ENCOUNTER — Other Ambulatory Visit (HOSPITAL_COMMUNITY)
Admission: RE | Admit: 2019-01-28 | Discharge: 2019-01-28 | Disposition: A | Discharge status: Home / Self Care | Payer: Medicaid Other | Source: Ambulatory Visit | Attending: Family Medicine | Admitting: Family Medicine

## 2019-01-28 ENCOUNTER — Ambulatory Visit: Payer: Medicaid Other | Attending: Family Medicine

## 2019-01-28 ENCOUNTER — Other Ambulatory Visit: Payer: Self-pay | Admitting: Nurse Practitioner

## 2019-01-28 DIAGNOSIS — B3731 Acute candidiasis of vulva and vagina: Secondary | ICD-10-CM

## 2019-01-28 DIAGNOSIS — B373 Candidiasis of vulva and vagina: Secondary | ICD-10-CM | POA: Diagnosis present

## 2019-01-29 MED ORDER — BUPIVACAINE HCL (PF) 0.5 % IJ SOLN
INTRAMUSCULAR | Status: AC
  Filled 2019-01-29: qty 30

## 2019-01-29 MED ORDER — BUPIVACAINE-EPINEPHRINE (PF) 0.5% -1:200000 IJ SOLN
INTRAMUSCULAR | Status: AC
  Filled 2019-01-29: qty 30

## 2019-01-30 LAB — CERVICOVAGINAL ANCILLARY ONLY
Bacterial vaginitis: NEGATIVE
Candida vaginitis: NEGATIVE
Chlamydia: NEGATIVE
Neisseria Gonorrhea: NEGATIVE
Trichomonas: NEGATIVE

## 2019-02-02 ENCOUNTER — Telehealth: Payer: Self-pay

## 2019-02-02 NOTE — Telephone Encounter (Signed)
Called patient to do their pre-visit COVID screening.  Call went to voicemail. Unable to do prescreening.  

## 2019-02-03 ENCOUNTER — Encounter: Payer: Self-pay | Admitting: Nurse Practitioner

## 2019-02-03 ENCOUNTER — Ambulatory Visit (INDEPENDENT_AMBULATORY_CARE_PROVIDER_SITE_OTHER): Payer: Medicaid Other | Admitting: Internal Medicine

## 2019-02-03 ENCOUNTER — Other Ambulatory Visit (HOSPITAL_COMMUNITY)
Admission: RE | Admit: 2019-02-03 | Discharge: 2019-02-03 | Disposition: A | Discharge status: Home / Self Care | Payer: Medicaid Other | Source: Ambulatory Visit | Attending: Internal Medicine | Admitting: Internal Medicine

## 2019-02-03 ENCOUNTER — Other Ambulatory Visit: Payer: Self-pay

## 2019-02-03 VITALS — BP 130/85 | HR 107 | Temp 97.2°F | Resp 17 | Wt 241.6 lb

## 2019-02-03 DIAGNOSIS — Z124 Encounter for screening for malignant neoplasm of cervix: Secondary | ICD-10-CM | POA: Diagnosis not present

## 2019-02-03 DIAGNOSIS — Z113 Encounter for screening for infections with a predominantly sexual mode of transmission: Secondary | ICD-10-CM | POA: Diagnosis present

## 2019-02-03 DIAGNOSIS — N898 Other specified noninflammatory disorders of vagina: Secondary | ICD-10-CM

## 2019-02-03 MED ORDER — FLUCONAZOLE 150 MG PO TABS: 150.0000 mg | ORAL_TABLET | Freq: Once | ORAL | 0 refills | Status: AC

## 2019-02-03 MED FILL — FLUCONAZOLE 150 MG TABLET: 150 | 1 days supply | Qty: 1 | Fill #0

## 2019-02-03 NOTE — Progress Notes (Signed)
Patient ID: Sarah Garrison, female    DOB: Oct 25, 1980  MRN: [id number]  CC: Gynecologic Exam   Subjective: Sarah Garrison is a 38 y.o. female who presents for pap  At [facility]. Her concerns today include:   Pt is G4P4 No abnormal pap On Depo for past 3-4 yrs. No abnormal bleeding Active with one female partner x 12 yrs Some vaginal itching which she feels is yeast from taking abx 1 wk ago.   No vaginal dischg. Would like to be screened for STI No fhx of cervical or uterine CA Felt some knot in RT breast mths ago.  "Then it went away."   HM:  Declines flu vaccine   Patient Active Problem List   Diagnosis Date Noted  . 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 Infective otitis externa of left ear 01/01/2019  . Essential hypertension 06/22/2018  . Mixed hyperlipidemia 06/22/2018  . Uncontrolled type 2 diabetes mellitus with complication, with long-term current use of insulin (HCC) 12/13/2016  . Non-adherence to medical treatment 12/13/2016  . Asthma 07/30/2016  . 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/07/2016  . Encounter for pregnancy test 02/01/2016  . Leg swelling 09/29/2015  . Morbid obesity, unspecified obesity type (HCC) 09/29/2015  . DM neuropathy, type II diabetes mellitus (HCC) 08/24/2015     Current Outpatient Medications on File Prior to Visit  Medication Sig Dispense Refill  . ACCU-CHEK FASTCLIX LANCETS MISC Test 4 times daily 120 each 3  . albuterol (VENTOLIN HFA) 108 (90 Base) MCG/ACT inhaler Inhale 1-2 puffs into the lungs every 6 (six) hours as needed for wheezing or shortness of breath. 18 g 1  . atorvastatin (LIPITOR) 20 MG tablet Take 1 tablet (20 mg total) by mouth daily. 90 tablet 3  . cetirizine (ZYRTEC) 10 MG tablet Take 1 tablet (10 mg total) by mouth daily. 30 tablet 11  . Continuous Blood Gluc Receiver (DEXCOM G6 RECEIVER) DEVI 1 each by Misc.(Non-Drug; Combo Route) route daily.    . Continuous Blood Gluc Sensor (DEXCOM G6 SENSOR) MISC 1 each by Misc.(Non-Drug; Combo  Route) route every 10 days.    . Continuous Blood Gluc Transmit (DEXCOM G6 TRANSMITTER) MISC 1 each by Misc.(Non-Drug; Combo Route) route every 3 months.    . insulin aspart (NOVOLOG FLEXPEN) 100 UNIT/ML FlexPen Inject 10 Units into the skin 3 (three) times daily.    . insulin aspart (NOVOLOG) 100 UNIT/ML injection Use via insulin pump. Max TDD 200 units    . Insulin Disposable Pump (OMNIPOD DASH 5 PACK PODS) MISC U ONE POD Q 2 DAYS UTD    . Insulin Human (INSULIN PUMP) SOLN Inject into the skin. Novolog Basal and Bolus    . lisinopril (ZESTRIL) 20 MG tablet Take 1 tablet (20 mg total) by mouth daily. 90 tablet 3  . medroxyPROGESTERone Acetate (DEPO-PROVERA IM) Inject into the muscle.    . meloxicam (MOBIC) 7.5 MG tablet Take 1 tablet (7.5 mg total) by mouth daily. 30 tablet 0  . mometasone-formoterol (DULERA) 100-5 MCG/ACT AERO Inhale 2 puffs into the lungs 2 (two) times a day. Patient will pick up scripts today. 13 g 11  . montelukast (SINGULAIR) 10 MG tablet Take 1 tablet (10 mg total) by mouth at bedtime. Patient will pick up scripts today. 90 tablet 3  . omeprazole (PRILOSEC) 40 MG capsule Take 1 capsule (40 mg total) by mouth daily. 90 capsule 3  . sitaGLIPtin (JANUVIA) 100 MG tablet Take 1 tablet by mouth daily.    Lauris Poag PEN  NEEDLES 32G X 4 MM MISC USE 3 TIMES DAILY AS DIRECTED 100 each 5   No current facility-administered medications on file prior to visit.     Allergies  Allergen Reactions  . Morphine And Related Shortness Of Breath  . Glyburide Diarrhea and Other (See Comments)    Reaction:  Nose bleeds   . Ivp Dye [Iodinated Diagnostic Agents] Other (See Comments)    Shortness of breath.    . Oxycodone Other (See Comments)    Pt states that this medication makes her "dream about rabbits chasing" her.    . Vicodin [Hydrocodone-Acetaminophen] Other (See Comments)    Pt states that this medication makes her "dream about rabbits chasing" her.      Social History    Socioeconomic History  . Marital status: Single    Spouse name: Not on file  . Number of children: 4  . Years of education: Not on file  . Highest education level: Not on file  Occupational History  . Not on file  Social Needs  . Financial resource strain: Not on file  . Food insecurity    Worry: Not on file    Inability: Not on file  . Transportation needs    Medical: Not on file    Non-medical: Not on file  Tobacco Use  . Smoking status: Former Smoker    Packs/day: 0.10    Types: Cigarettes    Quit date: 11/27/2017    Years since quitting: 1.1  . Smokeless tobacco: Never Used  Substance and Sexual Activity  . Alcohol use: No  . Drug use: No  . Sexual activity: Not on file  Lifestyle  . Physical activity    Days per week: Not on file    Minutes per session: Not on file  . Stress: Not on file  Relationships  . Social Herbalist on phone: Not on file    Gets together: Not on file    Attends religious service: Not on file    Active member of club or organization: Not on file    Attends meetings of clubs or organizations: Not on file    Relationship status: Not on file  . Intimate partner violence    Fear of current or ex partner: Not on file    Emotionally abused: Not on file    Physically abused: Not on file    Forced sexual activity: Not on file  Other Topics Concern  . Not on file  Social History Narrative  . Not on file    Family History  Problem Relation Age of Onset  . Asthma Mother   . Kidney failure Mother   . Brain cancer Mother   . Asthma Father   . Other Father        surgery on stomach but don't know from what  . Diabetes Brother   . Diabetes Maternal Grandmother     Past Surgical History:  Procedure Laterality Date  . CESAREAN SECTION    . HERNIA REPAIR    . TUBAL LIGATION    . VENTRAL HERNIA REPAIR N/A 11/27/2017   Procedure: VENTRAL HERNIA REPAIR ERAS PATHWAY;  Surgeon: Erroll Luna, MD;  Location: [place];  Service: General;  Laterality: N/A;  . WISDOM TOOTH EXTRACTION      ROS: Review of Systems Negative except as stated above  PHYSICAL EXAM: BP 130/85   Pulse (!) 107   Temp (!) 97.2 F (36.2 C) (Temporal)  Resp 17   Wt 241 lb 9.6 oz (109.6 kg)   SpO2 96%   BMI 40.20 kg/m   Physical Exam  General appearance - alert, well appearing, and in no distress Mental status - normal mood, behavior, speech, dress, motor activity, and thought processes Breasts -CMA Walker present: breasts are pendulous.  No abnormal masses felt.  No nipple discharge.  No axillary lymphadenopathy. Pelvic -CMA Walker present:  normal external genitalia, vulva, vagina, cervix, uterus and adnexa.  Small amount of thick white discharge around the cervix.   CMP Latest Ref Rng & Units 12/30/2018 12/20/2018 03/03/2018  Glucose 70 - 99 mg/dL 161(W291(H) 960(A259(H) 540(J485(H)  BUN 6 - 20 mg/dL 19 8 13   Creatinine 0.44 - 1.00 mg/dL 8.110.70 9.140.52 7.820.70  Sodium 135 - 145 mmol/L 136 134(L) 132(L)  Potassium 3.5 - 5.1 mmol/L 4.0 4.2 3.8  Chloride 98 - 111 mmol/L 105 105 100  CO2 22 - 32 mmol/L 21(L) 18(L) 24  Calcium 8.9 - 10.3 mg/dL 9.2 9.3 9.1  Total Protein 6.5 - 8.1 g/dL - - -  Total Bilirubin 0.3 - 1.2 mg/dL - - -  Alkaline Phos 38 - 126 U/L - - -  AST 15 - 41 U/L - - -  ALT 0 - 44 U/L - - -   Lipid Panel     Component Value Date/Time   CHOL 300 (H) 03/03/2018 1419   TRIG 187.0 (H) 03/03/2018 1419   HDL 46.30 03/03/2018 1419   CHOLHDL 6 03/03/2018 1419   VLDL 37.4 03/03/2018 1419   LDLCALC 216 (H) 03/03/2018 1419    CBC    Component Value Date/Time   WBC 7.2 12/30/2018 1124   RBC 4.78 12/30/2018 1124   HGB 14.2 12/30/2018 1124   HGB 14.6 10/16/2017 1025   HCT 42.3 12/30/2018 1124   HCT 43.2 10/16/2017 1025   PLT 327 12/30/2018 1124   PLT 302 10/16/2017 1025   MCV 88.5 12/30/2018 1124   MCV 89 10/16/2017 1025   MCH 29.7 12/30/2018 1124   MCHC 33.6 12/30/2018 1124   RDW 11.8 12/30/2018 1124   RDW 13.2  10/16/2017 1025   LYMPHSABS 3.0 12/20/2018 1152   LYMPHSABS 3.4 (H) 10/16/2017 1025   MONOABS 0.5 12/20/2018 1152   EOSABS 0.0 12/20/2018 1152   EOSABS 0.1 10/16/2017 1025   BASOSABS 0.0 12/20/2018 1152   BASOSABS 0.0 10/16/2017 1025    ASSESSMENT AND PLAN:  1. Pap smear for cervical cancer screening - Cytology - PAP() - fluconazole (DIFLUCAN) 150 MG tablet; Take 1 tablet (150 mg total) by mouth once for 1 dose.  Dispense: 1 tablet; Refill: 0  2. Routine screening for STI (sexually transmitted infection) Given the vaginal itching after she had completed antibiotics, I will treat this as likely candidal vaginitis.  Diflucan prescription given. - Cervicovaginal ancillary only   Patient was given the opportunity to ask questions.  Patient verbalized understanding of the plan and was able to repeat key elements of the plan.   No orders of the defined types were placed in this encounter.    Requested Prescriptions   Signed Prescriptions Disp Refills  . fluconazole (DIFLUCAN) 150 MG tablet 1 tablet 0    Sig: Take 1 tablet (150 mg total) by mouth once for 1 dose.    Return in about 4 weeks (around 03/03/2019) for Bertram DenverZelda Fleming for DM management.  Jonah Blueeborah Jacoby Zanni, MD, FACP

## 2019-02-03 NOTE — Patient Instructions (Signed)

## 2019-02-05 LAB — CERVICOVAGINAL ANCILLARY ONLY
Bacterial vaginitis: NEGATIVE
Candida vaginitis: POSITIVE — AB
Chlamydia: NEGATIVE
Neisseria Gonorrhea: NEGATIVE
Trichomonas: NEGATIVE

## 2019-02-05 LAB — CYTOLOGY - PAP
Diagnosis: NEGATIVE
HPV: NOT DETECTED

## 2019-02-13 ENCOUNTER — Other Ambulatory Visit (HOSPITAL_COMMUNITY)
Admission: RE | Admit: 2019-02-13 | Discharge: 2019-02-13 | Disposition: A | Discharge status: Home / Self Care | Payer: Medicaid Other | Source: Ambulatory Visit | Attending: Internal Medicine | Admitting: Internal Medicine

## 2019-02-13 DIAGNOSIS — Z01812 Encounter for preprocedural laboratory examination: Secondary | ICD-10-CM | POA: Diagnosis present

## 2019-02-13 DIAGNOSIS — Z20828 Contact with and (suspected) exposure to other viral communicable diseases: Secondary | ICD-10-CM | POA: Diagnosis not present

## 2019-02-13 LAB — SARS CORONAVIRUS 2 (TAT 6-24 HRS): SARS Coronavirus 2: NEGATIVE

## 2019-02-13 MED FILL — FLUCONAZOLE 150 MG TABLET: 150 | 1 days supply | Qty: 1 | Fill #0

## 2019-02-13 MED FILL — LANTUS SOLOSTAR 100 UNITS/M: 100 | 15 days supply | Qty: 30 | Fill #0

## 2019-02-13 MED FILL — NOVOLOG FLEXPEN SYRINGE: 100 | 29 days supply | Qty: 9 | Fill #0

## 2019-02-13 MED FILL — JANUVIA 100 MG TABLET: 100 | 30 days supply | Qty: 30 | Fill #0

## 2019-02-16 ENCOUNTER — Other Ambulatory Visit: Payer: Self-pay

## 2019-02-16 ENCOUNTER — Ambulatory Visit (HOSPITAL_BASED_OUTPATIENT_CLINIC_OR_DEPARTMENT_OTHER): Payer: Medicaid Other | Attending: Nurse Practitioner | Admitting: Internal Medicine

## 2019-02-16 VITALS — Temp 97.7°F | Ht 64.0 in | Wt 240.6 lb

## 2019-02-16 DIAGNOSIS — G4733 Obstructive sleep apnea (adult) (pediatric): Secondary | ICD-10-CM | POA: Insufficient documentation

## 2019-02-17 ENCOUNTER — Other Ambulatory Visit (HOSPITAL_BASED_OUTPATIENT_CLINIC_OR_DEPARTMENT_OTHER): Payer: Self-pay

## 2019-02-17 DIAGNOSIS — G4733 Obstructive sleep apnea (adult) (pediatric): Secondary | ICD-10-CM

## 2019-02-18 MED FILL — ACCU-CHEK GUIDE TEST STRIP: 50 days supply | Qty: 200 | Fill #0

## 2019-02-18 MED FILL — ACCU-CHEK FASTCLIX LANCETS: 25 days supply | Qty: 102 | Fill #0

## 2019-02-21 DIAGNOSIS — G4733 Obstructive sleep apnea (adult) (pediatric): Secondary | ICD-10-CM | POA: Diagnosis not present

## 2019-02-21 NOTE — Procedures (Signed)
Patient Name: Sarah Garrison, Sarah Garrison Date: 98[birth year] Gender: Female D.O.B: [birth year] Age (years): 38 Referring Provider: Gildardo Pounds NP Height (inches): 84 Interpreting Physician: Baird Lyons MD, ABSM Weight (lbs): 241 RPSGT: Carolin Coy BMI: 41 MRN: [id number] Neck Size: 15.50  CLINICAL INFORMATION Sleep Study Type: Split Night CPAP Indication for sleep study: Diabetes, Hypertension, Obesity, OSA Epworth Sleepiness Score: 1  SLEEP STUDY TECHNIQUE As per the AASM Manual for the Scoring of Sleep and Associated Events v2.3 (April 2016) with a hypopnea requiring 4% desaturations.  The channels recorded and monitored were frontal, central and occipital EEG, electrooculogram (EOG), submentalis EMG (chin), nasal and oral airflow, thoracic and abdominal wall motion, anterior tibialis EMG, snore microphone, electrocardiogram, and pulse oximetry. Continuous positive airway pressure (CPAP) was initiated when the patient met split night criteria and was titrated according to treat sleep-disordered breathing.  MEDICATIONS Medications self-administered by patient taken the night of the study : NOVALOG, TUMS E-X  RESPIRATORY PARAMETERS Diagnostic  Total AHI (/hr): 21.7 RDI (/hr): 30.8 OA Index (/hr): - CA Index (/hr): 1.0 REM AHI (/hr): 45.4 NREM AHI (/hr): 17.5 Supine AHI (/hr): 29.2 Non-supine AHI (/hr): 0 Min O2 Sat (%): 88.0 Mean O2 (%): 94.0 Time below 88% (min): 0.1   Titration  Optimal Pressure (cm): 7 AHI at Optimal Pressure (/hr): 5.3 Min O2 at Optimal Pressure (%): 90.0 Supine % at Optimal (%): 7 Sleep % at Optimal (%): 66   SLEEP ARCHITECTURE The recording time for the entire night was 360 minutes.  During a baseline period of 150.6 minutes, the patient slept for 124.5 minutes in REM and nonREM, yielding a sleep efficiency of 82.6%%. Sleep onset after lights out was 16.9 minutes with a REM latency of 61.5 minutes. The patient spent 7.2%% of the night in stage N1  sleep, 77.9%% in stage N2 sleep, 0.0%% in stage N3 and 14.9% in REM.  During the titration period of 202.1 minutes, the patient slept for 45.0 minutes in REM and nonREM, yielding a sleep efficiency of 22.3%%. Sleep onset after CPAP initiation was 79.5 minutes with a REM latency of N/A minutes. The patient spent 24.4%% of the night in stage N1 sleep, 75.6%% in stage N2 sleep, 0.0%% in stage N3 and 0% in REM.  CARDIAC DATA The 2 lead EKG demonstrated sinus rhythm. The mean heart rate was 100.0 beats per minute. Other EKG findings include: None.  LEG MOVEMENT DATA The total Periodic Limb Movements of Sleep (PLMS) were 0. The PLMS index was 0.0 .  IMPRESSIONS - Moderate obstructive sleep apnea occurred during the diagnostic portion of the study(AHI = 21.7/hour). An optimal PAP pressure was selected for this patient ( 7 cm of water) - No significant central sleep apnea occurred during the diagnostic portion of the study (CAI = 1.0/hour). - The patient had minimal or no oxygen desaturation during the diagnostic portion of the study (Min O2 = 88.0%). Mean sat on CPAP 7 was 92.5%. - The patient snored with moderate snoring volume during the diagnostic portion of the study. - No cardiac abnormalities were noted during this study. - Clinically significant periodic limb movements did not occur during sleep.  DIAGNOSIS - Obstructive Sleep Apnea (327.23 [G47.33 ICD-10])  RECOMMENDATIONS - Trial of CPAP therapy on 7 cm H2O or autopap 5-15. - Patient used a Small size Fisher&Paykel Full Face Mask F&P Vitera (new) mask and heated humidification. - Be careful with alcohol, sedatives and other CNS depressants that may worsen sleep apnea and disrupt normal  sleep architecture. - Sleep hygiene should be reviewed to assess factors that may improve sleep quality. - Weight management and regular exercise should be initiated or continued.  [Electronically signed] 02/21/2019 04:07 PM  Baird Lyons MD, [place], American Board of Sleep Medicine   NPI: [id number]                         [place], [place] [facility]  ELECTRONICALLY SIGNED ON:  02/21/2019, 4:05 PM [place] PH: [phone]   FX: [phone] [place]

## 2019-02-23 ENCOUNTER — Other Ambulatory Visit: Payer: Self-pay | Admitting: Nurse Practitioner

## 2019-02-23 MED ORDER — MISC. DEVICES MISC: 0 refills | Status: DC

## 2019-03-03 ENCOUNTER — Encounter: Payer: Self-pay | Admitting: Nurse Practitioner

## 2019-03-03 ENCOUNTER — Ambulatory Visit: Payer: Medicaid Other

## 2019-03-12 ENCOUNTER — Encounter: Payer: Self-pay | Admitting: Nurse Practitioner

## 2019-03-19 ENCOUNTER — Encounter: Payer: Self-pay | Admitting: Nurse Practitioner

## 2019-03-19 ENCOUNTER — Telehealth: Payer: Self-pay | Admitting: Nurse Practitioner

## 2019-03-19 NOTE — Telephone Encounter (Signed)
New Message   Pt states that her cpap is saying the pressure is 11 and she cant breath when going to sleep. Please f/u

## 2019-03-20 ENCOUNTER — Encounter: Payer: Self-pay | Admitting: Nurse Practitioner

## 2019-03-20 ENCOUNTER — Other Ambulatory Visit: Payer: Self-pay | Admitting: Nurse Practitioner

## 2019-03-20 DIAGNOSIS — Z789 Other specified health status: Secondary | ICD-10-CM

## 2019-03-20 NOTE — Telephone Encounter (Signed)
Will route to PCP regarding her CPAP pressure.

## 2019-03-20 NOTE — Telephone Encounter (Signed)
Will need to refer back for cpap titration study. They will call her to schedule

## 2019-03-23 ENCOUNTER — Emergency Department (HOSPITAL_COMMUNITY)
Admission: EM | Admit: 2019-03-23 | Discharge: 2019-03-24 | Disposition: A | Discharge status: Home / Self Care | Payer: Medicaid Other | Attending: Emergency Medicine | Admitting: Emergency Medicine

## 2019-03-23 ENCOUNTER — Encounter (HOSPITAL_COMMUNITY): Payer: Self-pay | Admitting: Emergency Medicine

## 2019-03-23 ENCOUNTER — Other Ambulatory Visit: Payer: Self-pay

## 2019-03-23 ENCOUNTER — Emergency Department (HOSPITAL_COMMUNITY): Payer: Medicaid Other

## 2019-03-23 DIAGNOSIS — Z79899 Other long term (current) drug therapy: Secondary | ICD-10-CM | POA: Insufficient documentation

## 2019-03-23 DIAGNOSIS — J45909 Unspecified asthma, uncomplicated: Secondary | ICD-10-CM | POA: Diagnosis not present

## 2019-03-23 DIAGNOSIS — R079 Chest pain, unspecified: Secondary | ICD-10-CM | POA: Diagnosis present

## 2019-03-23 DIAGNOSIS — Z794 Long term (current) use of insulin: Secondary | ICD-10-CM | POA: Insufficient documentation

## 2019-03-23 DIAGNOSIS — E119 Type 2 diabetes mellitus without complications: Secondary | ICD-10-CM | POA: Insufficient documentation

## 2019-03-23 DIAGNOSIS — I10 Essential (primary) hypertension: Secondary | ICD-10-CM | POA: Insufficient documentation

## 2019-03-23 DIAGNOSIS — Z87891 Personal history of nicotine dependence: Secondary | ICD-10-CM | POA: Insufficient documentation

## 2019-03-23 DIAGNOSIS — R0602 Shortness of breath: Secondary | ICD-10-CM | POA: Insufficient documentation

## 2019-03-23 LAB — CBC
HCT: 44.3 % (ref 36.0–46.0)
Hemoglobin: 14.9 g/dL (ref 12.0–15.0)
MCH: 29.8 pg (ref 26.0–34.0)
MCHC: 33.6 g/dL (ref 30.0–36.0)
MCV: 88.6 fL (ref 80.0–100.0)
Platelets: 336 10*3/uL (ref 150–400)
RBC: 5 MIL/uL (ref 3.87–5.11)
RDW: 11.9 % (ref 11.5–15.5)
WBC: 6.3 10*3/uL (ref 4.0–10.5)
nRBC: 0 % (ref 0.0–0.2)

## 2019-03-23 LAB — BASIC METABOLIC PANEL
Anion gap: 10 (ref 5–15)
BUN: 14 mg/dL (ref 6–20)
CO2: 20 mmol/L — ABNORMAL LOW (ref 22–32)
Calcium: 9.2 mg/dL (ref 8.9–10.3)
Chloride: 106 mmol/L (ref 98–111)
Creatinine, Ser: 0.86 mg/dL (ref 0.44–1.00)
GFR calc Af Amer: >60 mL/min (ref >60)
GFR calc non Af Amer: >60 mL/min (ref >60)
Glucose, Bld: 406 mg/dL — ABNORMAL HIGH (ref 70–99)
Potassium: 4.3 mmol/L (ref 3.5–5.1)
Sodium: 136 mmol/L (ref 135–145)

## 2019-03-23 LAB — TROPONIN I (HIGH SENSITIVITY): Troponin I (High Sensitivity): <2 ng/L (ref <18)

## 2019-03-23 LAB — I-STAT BETA HCG BLOOD, ED (NOT ORDERABLE): I-stat hCG, quantitative: <5 m[IU]/mL (ref <5)

## 2019-03-23 MED ORDER — SODIUM CHLORIDE 0.9% FLUSH: 3.0000 mL | Freq: Once | INTRAVENOUS | Status: DC

## 2019-03-23 NOTE — ED Triage Notes (Addendum)
Patient complaining of mid chest pain that has been for the past 4 days. Patient thinks that her cpap machine setting are making her like this. Patient states she thinks the settings are to high. Patient is also stating it is hard for her to breathe. O2 sat-97% on room air.

## 2019-03-23 NOTE — ED Provider Notes (Signed)
[facility]-EMERGENCY DEPT Provider Note   CSN: [id number] Arrival date & time: 03/23/19  2030     History   Chief Complaint Chief Complaint  Patient presents with  . Chest Pain    HPI Sarah Garrison is a 38 y.o. female.     HPI   She presents for evaluation of chest pain, ongoing for 4 days.  She is concerned about her CPAP machine causing this problem.  She was started on CPAP, 3 days ago.  She denies fever, productive cough, nausea, vomiting, weakness or dizziness.  No known sick contacts.  There are no other known modifying factors.    Past Medical History:  Diagnosis Date  . Asthma   . Diabetes mellitus without complication (HCC)   . GERD (gastroesophageal reflux disease)   . Hypertension     Patient Active Problem List   Diagnosis Date Noted  . OSA (obstructive sleep apnea) 02/16/2019  . 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 Infective otitis externa of left ear 01/01/2019  . Essential hypertension 06/22/2018  . Mixed hyperlipidemia 06/22/2018  . Uncontrolled type 2 diabetes mellitus with complication, with long-term current use of insulin (HCC) 12/13/2016  . Non-adherence to medical treatment 12/13/2016  . Asthma 07/30/2016  . 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/07/2016  . Encounter for pregnancy test 02/01/2016  . Leg swelling 09/29/2015  . Morbid obesity, unspecified obesity type (HCC) 09/29/2015  . DM neuropathy, type II diabetes mellitus (HCC) 08/24/2015    Past Surgical History:  Procedure Laterality Date  . CESAREAN SECTION    . HERNIA REPAIR    . TUBAL LIGATION    . VENTRAL HERNIA REPAIR N/A 11/27/2017   Procedure: VENTRAL HERNIA REPAIR ERAS PATHWAY;  Surgeon: Harriette Bouillonornett, Thomas, MD;  Location: [facility];  Service: General;  Laterality: N/A;  . WISDOM TOOTH EXTRACTION       OB History    Gravida  5   Para  4   Term      Preterm      AB  1   Living  4     SAB  1   TAB      Ectopic      Multiple      Live  Births               Home Medications    Prior to Admission medications   Medication Sig Start Date End Date Taking? Authorizing Provider  ACCU-CHEK FASTCLIX LANCETS MISC Test 4 times daily 03/03/18  Yes Reather LittlerKumar, Ajay, MD  albuterol (VENTOLIN HFA) 108 (90 Base) MCG/ACT inhaler Inhale 1-2 puffs into the lungs every 6 (six) hours as needed for wheezing or shortness of breath. 01/01/19  Yes Storm FriskWright, Patrick E, MD  calcium carbonate (TITRALAC) 420 MG CHEW chewable tablet Chew 420 mg by mouth daily.   Yes [provider]  Insulin Disposable Pump (OMNIPOD DASH 5 PACK PODS) MISC U ONE POD Q 2 DAYS UTD 12/23/18  Yes [provider]  Insulin Human (INSULIN PUMP) SOLN Inject into the skin. Novolog 100 units/ml  Basal and Bolus   Yes [provider]  medroxyPROGESTERone Acetate (DEPO-PROVERA IM) Inject 150 mLs into the muscle every 3 (three) months.    Yes [provider]  Misc. Devices MISC CPAP therapy on autopap 5-15. Needs Small size Fisher & Paykel Full Face Mask F&P  Vitera (new) mask and heated humidification. 02/23/19  Yes Claiborne RiggFleming, Zelda W, NP  sitaGLIPtin (JANUVIA) 100 MG tablet Take 100 mg  by mouth daily.  11/13/18  Yes [provider]  TRUEPLUS PEN NEEDLES 32G X 4 MM MISC USE 3 TIMES DAILY AS DIRECTED 06/16/18  Yes Newlin, Enobong, MD  atorvastatin (LIPITOR) 20 MG tablet Take 1 tablet (20 mg total) by mouth daily. Patient not taking: Reported on 03/23/2019 11/23/17   Gildardo Pounds, NP  cetirizine (ZYRTEC) 10 MG tablet Take 1 tablet (10 mg total) by mouth daily. Patient not taking: Reported on 03/23/2019 12/19/18   Gildardo Pounds, NP  lisinopril (ZESTRIL) 20 MG tablet Take 1 tablet (20 mg total) by mouth daily. Patient not taking: Reported on 03/23/2019 12/19/18   Gildardo Pounds, NP  meloxicam (MOBIC) 7.5 MG tablet Take 1 tablet (7.5 mg total) by mouth daily. Patient not taking: Reported on 03/23/2019 01/01/19   Elsie Stain, MD  omeprazole  (PRILOSEC) 40 MG capsule Take 1 capsule (40 mg total) by mouth daily. Patient not taking: Reported on 03/23/2019 06/18/18   Doran Stabler, MD    Family History Family History  Problem Relation Age of Onset  . Asthma Mother   . Kidney failure Mother   . Brain cancer Mother   . Asthma Father   . Other Father        surgery on stomach but don't know from what  . Diabetes Brother   . Diabetes Maternal Grandmother     Social History Social History   Tobacco Use  . Smoking status: Former Smoker    Packs/day: 0.10    Types: Cigarettes    Quit date: 11/27/2017    Years since quitting: 1.3  . Smokeless tobacco: Never Used  Substance Use Topics  . Alcohol use: No  . Drug use: No     Allergies   Morphine and related, Glyburide, Ivp dye [iodinated diagnostic agents], Oxycodone, and Vicodin [hydrocodone-acetaminophen]   Review of Systems Review of Systems  All other systems reviewed and are negative.    Physical Exam Updated Vital Signs BP 127/89 (BP Location: Left Arm)   Pulse (!) 106   Temp 99.2 F (37.3 C) (Oral)   Resp 18   Ht 5\' 4"  (1.626 m)   Wt 98 kg   SpO2 98%   BMI 37.08 kg/m   Physical Exam Vitals signs and nursing note reviewed.  Constitutional:      General: She is not in acute distress.    Appearance: She is well-developed. She is obese. She is not ill-appearing, toxic-appearing or diaphoretic.  HENT:     Head: Normocephalic and atraumatic.     Right Ear: External ear normal.     Left Ear: External ear normal.  Eyes:     Conjunctiva/sclera: Conjunctivae normal.     Pupils: Pupils are equal, round, and reactive to light.  Neck:     Musculoskeletal: Normal range of motion and neck supple.     Trachea: Phonation normal.  Cardiovascular:     Rate and Rhythm: Normal rate and regular rhythm.     Heart sounds: Normal heart sounds.  Pulmonary:     Effort: Pulmonary effort is normal. No respiratory distress.     Breath sounds: Normal breath  sounds. No stridor. No wheezing or rhonchi.  Chest:     Chest wall: No tenderness.  Abdominal:     Palpations: Abdomen is soft.     Tenderness: There is no abdominal tenderness.  Musculoskeletal: Normal range of motion.  Skin:    General: Skin is warm and dry.  Neurological:  Mental Status: She is alert and oriented to person, place, and time.     Cranial Nerves: No cranial nerve deficit.     Sensory: No sensory deficit.     Motor: No abnormal muscle tone.     Coordination: Coordination normal.  Psychiatric:        Mood and Affect: Mood normal.        Behavior: Behavior normal.        Thought Content: Thought content normal.        Judgment: Judgment normal.      ED Treatments / Results  Labs (all labs ordered are listed, but only abnormal results are displayed) Labs Reviewed  BASIC METABOLIC PANEL - Abnormal; Notable for the following components:      Result Value   CO2 20 (*)    Glucose, Bld 406 (*)    All other components within normal limits  CBC  I-STAT BETA HCG BLOOD, ED ([facility], [facility], [facility] ONLY)  I-STAT BETA HCG BLOOD, ED (NOT ORDERABLE)  TROPONIN I (HIGH SENSITIVITY)  TROPONIN I (HIGH SENSITIVITY)    EKG EKG Interpretation  Date/Time:  Monday March 23 2019 20:45:52 EDT Ventricular Rate:  124 PR Interval:    QRS Duration: 83 QT Interval:  328 QTC Calculation: 472 R Axis:   118 Text Interpretation:  Sinus tachycardia Left posterior fascicular block Baseline wander in lead(s) III aVF V1 V2 since last tracing no significant change Confirmed by Mancel Bale [phone]) on 03/23/2019 11:50:45 PM   Radiology Dg Chest 2 View  Result Date: 03/23/2019 CLINICAL DATA:  Chest pain EXAM: CHEST - 2 VIEW COMPARISON:  03/30/2017, 12/30/2018 FINDINGS: The heart size and mediastinal contours are within normal limits. Both lungs are clear. The visualized skeletal structures are unremarkable. Low lung volumes IMPRESSION: No active cardiopulmonary disease. Electronically Signed    By: Jasmine Pang M.D.   On: 03/23/2019 21:11    Procedures Procedures (including critical care time)  Medications Ordered in ED Medications  sodium chloride flush (NS) 0.9 % injection 3 mL (has no administration in time range)     Initial Impression / Assessment and Plan / ED Course  I have reviewed the triage vital signs and the nursing notes.  Pertinent labs & imaging results that were available during my care of the patient were reviewed by me and considered in my medical decision making (see chart for details).  Clinical Course as of Mar 24 15  Tue Mar 24, 2019  0002 Troponin I (High Sensitivity) [EW]  0002 Normal  Troponin I (High Sensitivity) [EW]  0002 Normal  CBC [EW]  0002 Normal  I-Stat beta hCG blood, ED [EW]  0002 Normal except blood sugar high.  Basic metabolic panel(!) [EW]  0003 No CHF or infiltrate, images reviewed by me  DG Chest 2 View [EW]    Clinical Course User Index [EW] Mancel Bale, MD        Patient Vitals for the past 24 hrs:  BP Temp Temp src Pulse Resp SpO2 Height Weight  03/23/19 2323 127/89 - - (!) 106 18 98 % - -  03/23/19 2050 - - - - - -  (1.626 m) 98 kg  03/23/19 2049 (!) 142/109 99.2 F (37.3 C) Oral (!) 124 19 97 % - -    12:14 AM Reevaluation with update and discussion. After initial assessment and treatment, an updated evaluation reveals no change in clinical status, findings discussed with the patient and all questions were answered. Mancel Bale  Medical Decision Making: Patient intolerant of CPAP, with nonspecific chest pain or shortness of breath.  Doubt pneumonia, PE, serious bacterial infection or impending vascular collapse.  CRITICAL CARE-no Performed by: Mancel Bale  Nursing Notes Reviewed/ Care Coordinated Applicable Imaging Reviewed Interpretation of Laboratory Data incorporated into ED treatment  The patient appears reasonably screened and/or stabilized for discharge and I doubt any other medical  condition or other [facility] requiring further screening, evaluation, or treatment in the ED at this time prior to discharge.  Plan: Home Medications-continue usual; Home Treatments-avoid using CPAP until consulting with prescribing physician; return here if the recommended treatment, does not improve the symptoms; Recommended follow up-call PCP in the morning to arrange follow-up care and treatment.  Final Clinical Impressions(s) / ED Diagnoses   Final diagnoses:  Nonspecific chest pain    ED Discharge Orders    None       Mancel Bale, MD 03/24/19 [phone]

## 2019-03-24 ENCOUNTER — Encounter: Payer: Self-pay | Admitting: Nurse Practitioner

## 2019-03-24 LAB — TROPONIN I (HIGH SENSITIVITY): Troponin I (High Sensitivity): <2 ng/L (ref <18)

## 2019-03-24 NOTE — Discharge Instructions (Signed)
The testing today does not show any acute problems with the lungs or heart.  You likely need to have your CPAP machine adjusted.  Contact the medical provider that prescribed the CPAP for you, to arrange for further care and treatment.

## 2019-03-25 ENCOUNTER — Other Ambulatory Visit: Payer: Self-pay | Admitting: Nurse Practitioner

## 2019-03-25 MED FILL — NovoLOG 100 UNIT/ML SOLN: 100 | 30 days supply | Qty: 60 | Fill #0

## 2019-03-25 MED FILL — OMEPRAZOLE DR 40 MG CAPSULE: 40 | 30 days supply | Qty: 30 | Fill #1

## 2019-03-25 MED FILL — JANUVIA 100 MG TABLET: 100 | 30 days supply | Qty: 30 | Fill #0

## 2019-03-26 NOTE — Telephone Encounter (Signed)
Patient is informed on referral.

## 2019-04-03 ENCOUNTER — Ambulatory Visit: Payer: Medicaid Other | Attending: Nurse Practitioner

## 2019-04-03 ENCOUNTER — Other Ambulatory Visit: Payer: Self-pay

## 2019-04-03 DIAGNOSIS — Z3042 Encounter for surveillance of injectable contraceptive: Secondary | ICD-10-CM | POA: Diagnosis not present

## 2019-04-03 MED ORDER — MEDROXYPROGESTERONE ACETATE 150 MG/ML IM SUSP
150.0000 mg | Freq: Once | INTRAMUSCULAR | Status: AC
  Administered 2019-04-03: 150 mg via INTRAMUSCULAR

## 2019-04-03 NOTE — Progress Notes (Signed)
Patient arrived at clinic to receive depo shot.  Patient was given depo shot in right ventrogluteal and tolerated shot well.  Patient is to return for her next injection.

## 2019-04-06 ENCOUNTER — Other Ambulatory Visit (HOSPITAL_COMMUNITY): Payer: Medicaid Other

## 2019-04-09 ENCOUNTER — Encounter (HOSPITAL_BASED_OUTPATIENT_CLINIC_OR_DEPARTMENT_OTHER): Payer: Medicaid Other | Admitting: Internal Medicine

## 2019-04-16 ENCOUNTER — Encounter (HOSPITAL_COMMUNITY): Payer: Self-pay | Admitting: Emergency Medicine

## 2019-04-16 ENCOUNTER — Other Ambulatory Visit: Payer: Self-pay

## 2019-04-16 ENCOUNTER — Ambulatory Visit (HOSPITAL_COMMUNITY)
Admission: EM | Admit: 2019-04-16 | Discharge: 2019-04-16 | Disposition: A | Discharge status: Home / Self Care | Payer: Medicaid Other | Attending: Family Medicine | Admitting: Family Medicine

## 2019-04-16 DIAGNOSIS — Z113 Encounter for screening for infections with a predominantly sexual mode of transmission: Secondary | ICD-10-CM | POA: Diagnosis present

## 2019-04-16 DIAGNOSIS — N898 Other specified noninflammatory disorders of vagina: Secondary | ICD-10-CM | POA: Diagnosis present

## 2019-04-16 MED ORDER — FLUCONAZOLE 150 MG PO TABS: ORAL_TABLET | ORAL | 0 refills | Status: DC

## 2019-04-16 NOTE — ED Provider Notes (Signed)
[facility]   [id number] 04/16/19 Arrival Time: [place]:  1. Vaginal itching   2. Screening for STDs (sexually transmitted diseases)     No s/s of PID. Declines empiric STD treatment.  Meds ordered this encounter  Medications  . fluconazole (DIFLUCAN) 150 MG tablet    Sig: Take one tablet by mouth as a single dose. May repeat in 3 days if symptoms persist.    Dispense:  2 tablet    Refill:  0      Discharge Instructions     We have sent testing for sexually transmitted infections. We will notify you of any positive results once they are received. If required, we will prescribe any medications you might need.  Please refrain from all sexual activity for at least the next seven days.     Pending: Labs Reviewed  CERVICOVAGINAL ANCILLARY ONLY    Will notify of any positive results. Instructed to refrain from sexual activity for at least seven days.  Reviewed expectations re: course of current medical issues. Questions answered. Outlined signs and symptoms indicating need for more acute intervention. Patient verbalized understanding. After Visit Summary given.   SUBJECTIVE:  Sarah Garrison is a 38 y.o. female who presents with complaint of vaginal itching without vaginal discharge. Onset gradual. First noticed 3 days ago; has been on antibiotic for reported ear infection. Denies: urinary frequency, hematuria and dysuria. Afebrile. No abdominal or pelvic pain. Normal PO intake wihout n/v. No rashes or lesions. Reports that she is sexually active with single female partner. OTC treatment: none. History of STI: none reported. Reports blood sugars have been running in the 170s recently.  No LMP recorded. Patient has had an injection.  ROS: As per HPI.  OBJECTIVE:  Vitals:   04/16/19 1857  BP: 139/87  Pulse: (!) 113  Resp: 20  Temp: 98.4 F (36.9 C)  SpO2: 97%     General appearance: alert, cooperative, appears stated age and no  distress Throat: lips, mucosa, and tongue normal; teeth and gums normal CV: RRR Lungs: CTAB Back: no CVA tenderness; FROM at waist Abdomen: soft, non-tender GU: deferred Skin: warm and dry Psychological: alert and cooperative; normal mood and affect.    Labs Reviewed  CERVICOVAGINAL ANCILLARY ONLY    Allergies  Allergen Reactions  . Morphine And Related Shortness Of Breath  . Glyburide Diarrhea and Other (See Comments)    Reaction:  Nose bleeds   . Ivp Dye [Iodinated Diagnostic Agents] Other (See Comments)    Shortness of breath.    . Oxycodone Other (See Comments)    Pt states that this medication makes her "dream about rabbits chasing" her.    . Vicodin [Hydrocodone-Acetaminophen] Other (See Comments)    Pt states that this medication makes her "dream about rabbits chasing" her.      Past Medical History:  Diagnosis Date  . Asthma   . Diabetes mellitus without complication ([place])   . GERD (gastroesophageal reflux disease)   . Hypertension    Family History  Problem Relation Age of Onset  . Asthma Mother   . Kidney failure Mother   . Brain cancer Mother   . Asthma Father   . Other Father        surgery on stomach but don't know from what  . Diabetes Brother   . Diabetes Maternal Grandmother    Social History   Socioeconomic History  . Marital status: Single    Spouse name: Not on  file  . Number of children: 4  . Years of education: Not on file  . Highest education level: Not on file  Occupational History  . Not on file  Social Needs  . Financial resource strain: Not on file  . Food insecurity    Worry: Not on file    Inability: Not on file  . Transportation needs    Medical: Not on file    Non-medical: Not on file  Tobacco Use  . Smoking status: Former Smoker    Packs/day: 0.10    Types: Cigarettes    Quit date: 11/27/2017    Years since quitting: 1.3  . Smokeless tobacco: Never Used  Substance and Sexual Activity  . Alcohol use: No  . Drug  use: No  . Sexual activity: Not on file  Lifestyle  . Physical activity    Days per week: Not on file    Minutes per session: Not on file  . Stress: Not on file  Relationships  . Social Musician on phone: Not on file    Gets together: Not on file    Attends religious service: Not on file    Active member of club or organization: Not on file    Attends meetings of clubs or organizations: Not on file    Relationship status: Not on file  . Intimate partner violence    Fear of current or ex partner: Not on file    Emotionally abused: Not on file    Physically abused: Not on file    Forced sexual activity: Not on file  Other Topics Concern  . Not on file  Social History Narrative  . Not on file          Mardella Layman, MD 04/16/19 Izell Braxton

## 2019-04-16 NOTE — ED Triage Notes (Signed)
Pt c/o vaginal itching x3 days. No other symptoms.

## 2019-04-16 NOTE — Discharge Instructions (Signed)
We have sent testing for sexually transmitted infections. We will notify you of any positive results once they are received. If required, we will prescribe any medications you might need.  Please refrain from all sexual activity for at least the next seven days.  

## 2019-04-17 ENCOUNTER — Other Ambulatory Visit (HOSPITAL_COMMUNITY)
Admission: RE | Admit: 2019-04-17 | Discharge: 2019-04-17 | Disposition: A | Discharge status: Home / Self Care | Payer: Medicaid Other | Source: Ambulatory Visit | Attending: Internal Medicine | Admitting: Internal Medicine

## 2019-04-17 DIAGNOSIS — Z01812 Encounter for preprocedural laboratory examination: Secondary | ICD-10-CM | POA: Diagnosis present

## 2019-04-17 DIAGNOSIS — Z20828 Contact with and (suspected) exposure to other viral communicable diseases: Secondary | ICD-10-CM | POA: Insufficient documentation

## 2019-04-18 LAB — SARS CORONAVIRUS 2 (TAT 6-24 HRS): SARS Coronavirus 2: NEGATIVE

## 2019-04-20 ENCOUNTER — Other Ambulatory Visit: Payer: Self-pay

## 2019-04-20 ENCOUNTER — Ambulatory Visit (HOSPITAL_BASED_OUTPATIENT_CLINIC_OR_DEPARTMENT_OTHER): Payer: Medicaid Other | Attending: Nurse Practitioner | Admitting: Internal Medicine

## 2019-04-20 DIAGNOSIS — Z789 Other specified health status: Secondary | ICD-10-CM | POA: Insufficient documentation

## 2019-04-20 LAB — CERVICOVAGINAL ANCILLARY ONLY
Bacterial vaginitis: NEGATIVE
Candida vaginitis: POSITIVE — AB
Chlamydia: NEGATIVE
Neisseria Gonorrhea: NEGATIVE
Trichomonas: NEGATIVE

## 2019-04-22 ENCOUNTER — Ambulatory Visit: Payer: Medicaid Other

## 2019-04-22 MED ORDER — PROPOFOL 10 MG/ML IV BOLUS
INTRAVENOUS | Status: AC
  Filled 2019-04-22: qty 20

## 2019-04-22 MED ORDER — FENTANYL CITRATE (PF) 100 MCG/2ML IJ SOLN
INTRAMUSCULAR | Status: AC
  Filled 2019-04-22: qty 2

## 2019-04-22 MED ORDER — MIDAZOLAM HCL 2 MG/2ML IJ SOLN
INTRAMUSCULAR | Status: AC
  Filled 2019-04-22: qty 2

## 2019-04-26 DIAGNOSIS — G4733 Obstructive sleep apnea (adult) (pediatric): Secondary | ICD-10-CM | POA: Diagnosis not present

## 2019-04-26 DIAGNOSIS — Z789 Other specified health status: Secondary | ICD-10-CM

## 2019-04-26 NOTE — Procedures (Signed)
Patient Name: Sarah Garrison, Sarah Garrison Date: [birth year] Gender: Female D.O.B: June 18, 1980 Age (years): 38 Referring Provider: Gildardo Pounds NP Height (inches): 64 Interpreting Physician: Baird Lyons MD, ABSM Weight (lbs): 229 RPSGT: Carolin Coy BMI: 34 MRN: [id number] Neck Size: 14.50  CLINICAL INFORMATION The patient is referred for a CPAP titration to treat sleep apnea.  Date of NPSG, Split Night or HST: NPSG 02/16/2019 AHI 21.7/ hr, desaturation to 88%, body weight 241 lbs  SLEEP STUDY TECHNIQUE As per the AASM Manual for the Scoring of Sleep and Associated Events v2.3 (April 2016) with a hypopnea requiring 4% desaturations.  The channels recorded and monitored were frontal, central and occipital EEG, electrooculogram (EOG), submentalis EMG (chin), nasal and oral airflow, thoracic and abdominal wall motion, anterior tibialis EMG, snore microphone, electrocardiogram, and pulse oximetry. Continuous positive airway pressure (CPAP) was initiated at the beginning of the study and titrated to treat sleep-disordered breathing.  MEDICATIONS Medications self-administered by patient taken the night of the study : ADVIL PM, NOVALOG, TUMS E-X  TECHNICIAN COMMENTS Comments added by technician: PATIENT WAS ORDERED AS A CPAP TITRATION. Comments added by scorer: N/A  RESPIRATORY PARAMETERS Optimal PAP Pressure (cm): 15 AHI at Optimal Pressure (/hr): 1.3 Overall Minimal O2 (%): 86.0 Supine % at Optimal Pressure (%): 100 Minimal O2 at Optimal Pressure (%): 94.0   SLEEP ARCHITECTURE The study was initiated at 10:47:17 PM and ended at 4:57:02 AM.  Sleep onset time was 50.4 minutes and the sleep efficiency was 81.9%%. The total sleep time was 303 minutes.  The patient spent 6.9%% of the night in stage N1 sleep, 72.9%% in stage N2 sleep, 0.2%% in stage N3 and 20% in REM.Stage REM latency was 56.5 minutes  Wake after sleep onset was 16.4. Alpha intrusion was absent. Supine sleep was  100.00%.  CARDIAC DATA The 2 lead EKG demonstrated sinus rhythm. The mean heart rate was 87.9 beats per minute. Other EKG findings include: None.  LEG MOVEMENT DATA The total Periodic Limb Movements of Sleep (PLMS) were 0. The PLMS index was 0.0. A PLMS index of <15 is considered normal in adults.  IMPRESSIONS - The optimal PAP pressure was 15 cm of water. - Central sleep apnea was not noted during this titration (CAI = 3.2/h). - Moderate oxygen desaturations were observed during this titration (Sarah Garrison O2 = 86.0%). Sarah Garrison sat at CPAP 15 was 94%. - The patient snored with soft snoring volume during this titration study. - No cardiac abnormalities were observed during this study. - Clinically significant periodic limb movements were not noted during this study.   DIAGNOSIS - Obstructive Sleep Apnea (327.23 [G47.33 ICD-10])  RECOMMENDATIONS - Trial of CPAP therapy on 15 cm H2O or autopap 10-20. - Patient used a Small size Fisher&Paykel Full Face Mask Simplus mask and heated humidification. - Be careful with alcohol, sedatives and other CNS depressants that may worsen sleep apnea and disrupt normal sleep architecture. - Sleep hygiene should be reviewed to assess factors that may improve sleep quality. - Weight management and regular exercise should be initiated or continued.  [Electronically signed] 04/26/2019 10:01 AM  Baird Lyons MD, ABSM Diplomate, American Board of Sleep Medicine   NPI: [id number]                         [facility]  ELECTRONICALLY SIGNED ON:  04/26/2019, 9:58 AM [place]: [phone]   FX: [phone]  [facility]

## 2019-04-28 ENCOUNTER — Encounter: Payer: Self-pay | Admitting: Nurse Practitioner

## 2019-04-28 ENCOUNTER — Other Ambulatory Visit: Payer: Self-pay | Admitting: Nurse Practitioner

## 2019-04-28 MED ORDER — MISC. DEVICES MISC: 0 refills | Status: DC

## 2019-05-04 ENCOUNTER — Telehealth: Payer: Self-pay

## 2019-05-04 NOTE — Telephone Encounter (Signed)
CMA faxed supplies order to [facility].  Supplies: CPAP

## 2019-05-08 ENCOUNTER — Encounter: Payer: Self-pay | Admitting: Nurse Practitioner

## 2019-05-25 ENCOUNTER — Telehealth: Payer: Self-pay | Admitting: *Deleted

## 2019-05-25 MED FILL — NovoLOG 100 UNIT/ML SOLN: 100 | 30 days supply | Qty: 60 | Fill #1

## 2019-05-25 NOTE — Telephone Encounter (Signed)
Patient completed titration 11/16 and is needing to be advised on if she needs to bring the machine to have the settings set and change.

## 2019-05-27 NOTE — Telephone Encounter (Signed)
Is she currently using her CPAP and not having any issues? If so she does not need another order. The last note from the sleep study stated she needed an additional test.

## 2019-06-03 NOTE — Telephone Encounter (Signed)
She is needing the titration redone.

## 2019-06-07 NOTE — Telephone Encounter (Signed)
Yes she needs to take her CPAP and supplies. Thanks!

## 2019-06-08 ENCOUNTER — Encounter (HOSPITAL_COMMUNITY): Payer: Self-pay | Admitting: *Deleted

## 2019-06-08 ENCOUNTER — Other Ambulatory Visit: Payer: Self-pay

## 2019-06-08 ENCOUNTER — Emergency Department (HOSPITAL_COMMUNITY): Payer: Medicaid Other

## 2019-06-08 ENCOUNTER — Emergency Department (HOSPITAL_COMMUNITY)
Admission: EM | Admit: 2019-06-08 | Discharge: 2019-06-08 | Disposition: A | Discharge status: Home / Self Care | Payer: Medicaid Other | Attending: Emergency Medicine | Admitting: Emergency Medicine

## 2019-06-08 DIAGNOSIS — H6641 Suppurative otitis media, unspecified, right ear: Secondary | ICD-10-CM | POA: Diagnosis not present

## 2019-06-08 DIAGNOSIS — Z79899 Other long term (current) drug therapy: Secondary | ICD-10-CM | POA: Diagnosis not present

## 2019-06-08 DIAGNOSIS — I10 Essential (primary) hypertension: Secondary | ICD-10-CM | POA: Diagnosis not present

## 2019-06-08 DIAGNOSIS — J45909 Unspecified asthma, uncomplicated: Secondary | ICD-10-CM | POA: Diagnosis not present

## 2019-06-08 DIAGNOSIS — Z794 Long term (current) use of insulin: Secondary | ICD-10-CM | POA: Insufficient documentation

## 2019-06-08 DIAGNOSIS — Z9641 Presence of insulin pump (external) (internal): Secondary | ICD-10-CM | POA: Insufficient documentation

## 2019-06-08 DIAGNOSIS — R0789 Other chest pain: Secondary | ICD-10-CM | POA: Insufficient documentation

## 2019-06-08 DIAGNOSIS — H6691 Otitis media, unspecified, right ear: Secondary | ICD-10-CM

## 2019-06-08 DIAGNOSIS — Z87891 Personal history of nicotine dependence: Secondary | ICD-10-CM | POA: Diagnosis not present

## 2019-06-08 DIAGNOSIS — E119 Type 2 diabetes mellitus without complications: Secondary | ICD-10-CM | POA: Diagnosis not present

## 2019-06-08 DIAGNOSIS — R079 Chest pain, unspecified: Secondary | ICD-10-CM

## 2019-06-08 LAB — BASIC METABOLIC PANEL
Anion gap: 10 (ref 5–15)
BUN: 21 mg/dL — ABNORMAL HIGH (ref 6–20)
CO2: 21 mmol/L — ABNORMAL LOW (ref 22–32)
Calcium: 9.7 mg/dL (ref 8.9–10.3)
Chloride: 104 mmol/L (ref 98–111)
Creatinine, Ser: 0.69 mg/dL (ref 0.44–1.00)
GFR calc Af Amer: >60 mL/min (ref >60)
GFR calc non Af Amer: >60 mL/min (ref >60)
Glucose, Bld: 379 mg/dL — ABNORMAL HIGH (ref 70–99)
Potassium: 4 mmol/L (ref 3.5–5.1)
Sodium: 135 mmol/L (ref 135–145)

## 2019-06-08 LAB — CBC
HCT: 43.2 % (ref 36.0–46.0)
Hemoglobin: 14.4 g/dL (ref 12.0–15.0)
MCH: 29.7 pg (ref 26.0–34.0)
MCHC: 33.3 g/dL (ref 30.0–36.0)
MCV: 89.1 fL (ref 80.0–100.0)
Platelets: 290 10*3/uL (ref 150–400)
RBC: 4.85 MIL/uL (ref 3.87–5.11)
RDW: 11.9 % (ref 11.5–15.5)
WBC: 6.3 10*3/uL (ref 4.0–10.5)
nRBC: 0 % (ref 0.0–0.2)

## 2019-06-08 LAB — CBG MONITORING, ED
Glucose-Capillary: 322 mg/dL — ABNORMAL HIGH (ref 70–99)
Glucose-Capillary: 271 mg/dL — ABNORMAL HIGH (ref 70–99)
Glucose-Capillary: 236 mg/dL — ABNORMAL HIGH (ref 70–99)

## 2019-06-08 LAB — TROPONIN I (HIGH SENSITIVITY)
Troponin I (High Sensitivity): <2 ng/L (ref <18)
Troponin I (High Sensitivity): <2 ng/L (ref <18)

## 2019-06-08 LAB — I-STAT BETA HCG BLOOD, ED (NOT ORDERABLE): I-stat hCG, quantitative: <5 m[IU]/mL (ref <5)

## 2019-06-08 MED ORDER — LIDOCAINE VISCOUS HCL 2 % MT SOLN
15.0000 mL | Freq: Once | OROMUCOSAL | Status: AC
  Administered 2019-06-08: 15 mL via ORAL
  Filled 2019-06-08: qty 15

## 2019-06-08 MED ORDER — FLUCONAZOLE 150 MG PO TABS: 150.0000 mg | ORAL_TABLET | Freq: Once | ORAL | 0 refills | Status: AC

## 2019-06-08 MED ORDER — FAMOTIDINE 20 MG PO TABS
20.0000 mg | ORAL_TABLET | Freq: Once | ORAL | Status: AC
  Administered 2019-06-08: 20 mg via ORAL
  Filled 2019-06-08: qty 1

## 2019-06-08 MED ORDER — ALUM & MAG HYDROXIDE-SIMETH 200-200-20 MG/5ML PO SUSP
30.0000 mL | Freq: Once | ORAL | Status: AC
  Administered 2019-06-08: 30 mL via ORAL
  Filled 2019-06-08: qty 30

## 2019-06-08 MED ORDER — AMOXICILLIN 500 MG PO CAPS: 500.0000 mg | ORAL_CAPSULE | Freq: Three times a day (TID) | ORAL | 0 refills | Status: DC

## 2019-06-08 MED ORDER — KETOROLAC TROMETHAMINE 30 MG/ML IJ SOLN
30.0000 mg | Freq: Once | INTRAMUSCULAR | Status: AC
  Administered 2019-06-08: 30 mg via INTRAMUSCULAR
  Filled 2019-06-08: qty 1

## 2019-06-08 MED ORDER — SODIUM CHLORIDE 0.9% FLUSH: 3.0000 mL | Freq: Once | INTRAVENOUS | Status: DC

## 2019-06-08 MED FILL — FLUCONAZOLE 150 MG TABLET: 150 | 1 days supply | Qty: 1 | Fill #0

## 2019-06-08 MED FILL — AMOXICILLIN 500 MG CAPSULE: 500 | 7 days supply | Qty: 21 | Fill #0

## 2019-06-08 NOTE — Discharge Instructions (Signed)
Your work-up in the emergency department today was reassuring and did not reveal a concerning cause of your chest pain.  We advise continued follow-up with your primary care doctor.  With regards to your ear pain, follow-up with your ENT doctor.  You have been placed on a course of amoxicillin.  Take this as prescribed.  You may use Diflucan upon completion of these antibiotics to prevent yeast infection.  Return to the ED for any new or concerning symptoms.

## 2019-06-08 NOTE — Telephone Encounter (Signed)
Please let patient know she needs to take her CPAP and supplies to the titration appointment, so the settings may be adjusted accordingly

## 2019-06-08 NOTE — ED Triage Notes (Signed)
Pt reports chest tightness that started around 17:00.  Pt reports the pain medication went away around 18:00 after taking heartburn medication.  Pt states that around 23:00/00:00, the pain returned.  The pain is now presents in her chest and back. Pt denies any SOB, n/v.  No visible diaphoresis present.  Pt states she feel something is in her throat. Pt a/ox 4 and ambulatory.

## 2019-06-08 NOTE — ED Provider Notes (Signed)
[facility] DEPT Provider Note   CSN: [id number] Arrival date & time: 06/08/19  0125     History Chief Complaint  Patient presents with   Chest Pain    Sarah Garrison is a 39 y.o. female.   39 year old female with history of diabetes, esophageal reflux, hypertension, asthma presents to the emergency department for evaluation of chest pain.  She reports a tightness in her central chest which initially began at 1700.  She took omeprazole for symptoms and pain resolved an hour later.  Patient had symptomatic recurrence around midnight which has remained constant and has been getting slightly worse.  She feels a sensation of foreign body in her throat, but has not had any sore throat, difficulty swallowing.  Chest pain is not exertional.  Denies hemoptysis, leg swelling, syncope, shortness of breath, nausea, vomiting, diaphoresis, recent fevers.  No hx of DVT/PE or ACS.  As an aside, patient complaining of a right-sided earache.  She is followed by ENT after being diagnosed with an ongoing ear infection during the summer. No drainage from the ear. No medications taken PTA for otalgia.  The history is provided by the patient. No language interpreter was used.  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   Infective otitis externa of left ear 01/01/2019   Essential hypertension 06/22/2018   Mixed hyperlipidemia 06/22/2018   Uncontrolled type 2 diabetes mellitus with complication, with long-term current use of insulin ([place]) 12/13/2016   Non-adherence to medical treatment 12/13/2016   Asthma 07/30/2016   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/07/2016   Encounter for pregnancy test 02/01/2016   Leg  swelling 09/29/2015   Morbid obesity, unspecified obesity type ([place]) 09/29/2015   DM neuropathy, type II diabetes mellitus ([place]) 08/24/2015    Past Surgical History:  Procedure Laterality Date   CESAREAN SECTION     HERNIA REPAIR     TUBAL LIGATION     VENTRAL HERNIA REPAIR N/A 11/27/2017   Procedure: [place];  Surgeon: Erroll Luna, MD;  Location: [facility];  Service: General;  Laterality: N/A;   WISDOM TOOTH EXTRACTION       OB History    Gravida  5   Para  4   Term      Preterm      AB  1   Living  4     SAB  1   TAB      Ectopic      Multiple      Live Births              Family History  Problem Relation Age of Onset   Asthma Mother    Kidney failure Mother    Brain cancer Mother    Asthma Father    Other Father        surgery on stomach but don't know from what   Diabetes Brother    Diabetes Maternal Grandmother     Social History   Tobacco Use   Smoking status: Former Smoker    Packs/day: 0.10    Types: Cigarettes    Quit date: 11/27/2017    Years since quitting: 1.5  Smokeless tobacco: Never Used  Substance Use Topics   Alcohol use: No   Drug use: No    Home Medications Prior to Admission medications   Medication Sig Start Date End Date Taking? Authorizing Provider  ACCU-CHEK FASTCLIX LANCETS MISC Test 4 times daily 03/03/18   Reather Littler, MD  albuterol (VENTOLIN HFA) 108 (90 Base) MCG/ACT inhaler Inhale 1-2 puffs into the lungs every 6 (six) hours as needed for wheezing or shortness of breath. 01/01/19   Storm Frisk, MD  amoxicillin (AMOXIL) 500 MG capsule Take 1 capsule (500 mg total) by mouth 3 (three) times daily. 06/08/19   Antony Madura, PA-C  atorvastatin (LIPITOR) 20 MG tablet Take 1 tablet (20 mg total) by mouth daily. Patient not taking: Reported on 03/23/2019 11/23/17   Claiborne Rigg, NP  calcium carbonate (TITRALAC) 420 MG CHEW chewable tablet Chew 420 mg  by mouth daily.    [provider]  cetirizine (ZYRTEC) 10 MG tablet Take 1 tablet (10 mg total) by mouth daily. Patient not taking: Reported on 03/23/2019 12/19/18   Claiborne Rigg, NP  fluconazole (DIFLUCAN) 150 MG tablet Take 1 tablet (150 mg total) by mouth once for 1 dose. Take upon completion of antibiotics. 06/08/19 06/08/19  Antony Madura, PA-C  Insulin Disposable Pump (OMNIPOD DASH 5 PACK PODS) MISC U ONE POD Q 2 DAYS UTD 12/23/18   [provider]  Insulin Human (INSULIN PUMP) SOLN Inject into the skin. Novolog 100 units/ml  Basal and Bolus    [provider]  lisinopril (ZESTRIL) 20 MG tablet Take 1 tablet (20 mg total) by mouth daily. Patient not taking: Reported on 03/23/2019 12/19/18   Claiborne Rigg, NP  medroxyPROGESTERone Acetate (DEPO-PROVERA IM) Inject 150 mLs into the muscle every 3 (three) months.     [provider]  meloxicam (MOBIC) 7.5 MG tablet Take 1 tablet (7.5 mg total) by mouth daily. Patient not taking: Reported on 03/23/2019 01/01/19   Storm Frisk, MD  Misc. Devices MISC CPAP therapy on autopap 10-20. Needs Small size Fisher&Paykel Full Face Mask Simplus  mask and heated humidification. 04/28/19   Claiborne Rigg, NP  omeprazole (PRILOSEC) 40 MG capsule Take 1 capsule (40 mg total) by mouth daily. Patient not taking: Reported on 03/23/2019 06/18/18   Sherrilyn Rist, MD  sitaGLIPtin (JANUVIA) 100 MG tablet Take 100 mg by mouth daily.  11/13/18   [provider]  TRUEPLUS PEN NEEDLES 32G X 4 MM MISC USE 3 TIMES DAILY AS DIRECTED 06/16/18   Hoy Register, MD    Allergies    Morphine and related, Glyburide, Ivp dye [iodinated diagnostic agents], Oxycodone, and Vicodin [hydrocodone-acetaminophen]  Review of Systems   Review of Systems  Cardiovascular: Positive for chest pain.  Ten systems reviewed and are negative for acute change, except as noted in the HPI.    Physical Exam Updated Vital Signs BP (!) 143/103  (BP Location: Left Arm)    Pulse (!) 115    Temp 98.1 F (36.7 C) (Oral)    Resp 16    Ht 5\' 4"  (1.626 m)    Wt 102.1 kg    SpO2 100%    BMI 38.62 kg/m   Physical Exam Vitals and nursing note reviewed.  Constitutional:      General: She is not in acute distress.    Appearance: She is well-developed. She is not diaphoretic.     Comments: Obese, pleasant AA female. Nontoxic.  HENT:  Head: Normocephalic and atraumatic.     Right Ear: Ear canal and external ear normal.     Left Ear: Tympanic membrane, ear canal and external ear normal.     Ears:     Comments: Erythematous and dull right TM without bulging, retraction, perforation.    Mouth/Throat:     Comments: Tolerating secretions. No voice muffling. Eyes:     General: No scleral icterus.    Conjunctiva/sclera: Conjunctivae normal.  Neck:     Comments: No meningismus Cardiovascular:     Rate and Rhythm: Regular rhythm. Tachycardia present.     Pulses: Normal pulses.     Comments: Intermittent, mild tachycardia; on chart review this appears to be patient's baseline since at least 2018. Pulmonary:     Effort: Pulmonary effort is normal. No respiratory distress.     Breath sounds: No stridor. No wheezing.     Comments: Respirations even and unlabored Musculoskeletal:        General: Normal range of motion.     Cervical back: Normal range of motion.  Skin:    General: Skin is warm and dry.     Coloration: Skin is not pale.     Findings: No erythema or rash.  Neurological:     General: No focal deficit present.     Mental Status: She is alert and oriented to person, place, and time.     Coordination: Coordination normal.  Psychiatric:        Behavior: Behavior normal.     ED Results / Procedures / Treatments   Labs (all labs ordered are listed, but only abnormal results are displayed) Labs Reviewed  BASIC METABOLIC PANEL - Abnormal; Notable for the following components:      Result Value   CO2 21 (*)    Glucose, Bld  379 (*)    BUN 21 (*)    All other components within normal limits  CBG MONITORING, ED - Abnormal; Notable for the following components:   Glucose-Capillary 322 (*)    All other components within normal limits  CBG MONITORING, ED - Abnormal; Notable for the following components:   Glucose-Capillary 271 (*)    All other components within normal limits  CBC  I-STAT BETA HCG BLOOD, ED (NOT ORDERABLE)  CBG MONITORING, ED  TROPONIN I (HIGH SENSITIVITY)  TROPONIN I (HIGH SENSITIVITY)    EKG EKG Interpretation  Date/Time:  Monday June 08 2019 01:39:02 EST Ventricular Rate:  108 PR Interval:    QRS Duration: 87 QT Interval:  329 QTC Calculation: 441 R Axis:   138 Text Interpretation: Sinus tachycardia Left posterior fascicular block Abnormal R-wave progression, late transition No significant change since last tracing Confirmed by Rochele Raring [id number]) on 06/08/2019 1:41:50 AM   Radiology DG Chest 2 View  Result Date: 06/08/2019 CLINICAL DATA:  Chest pain EXAM: CHEST - 2 VIEW COMPARISON:  03/23/2019 FINDINGS: The heart size and mediastinal contours are within normal limits. Both lungs are clear. The visualized skeletal structures are unremarkable. IMPRESSION: No active cardiopulmonary disease. Electronically Signed   By: Jasmine Pang M.D.   On: 06/08/2019 02:45    Procedures Procedures (including critical care time)  Medications Ordered in ED Medications  sodium chloride flush (NS) 0.9 % injection 3 mL (3 mLs Intravenous Not Given 06/08/19 0558)  ketorolac (TORADOL) 30 MG/ML injection 30 mg (has no administration in time range)  famotidine (PEPCID) tablet 20 mg (has no administration in time range)  alum & mag hydroxide-simeth (MAALOX/MYLANTA) 200-200-20 MG/5ML  suspension 30 mL (30 mLs Oral Given 06/08/19 0554)    And  lidocaine (XYLOCAINE) 2 % viscous mouth solution 15 mL (15 mLs Oral Given 06/08/19 0554)    ED Course  I have reviewed the triage vital signs and the nursing  notes.  Pertinent labs & imaging results that were available during my care of the patient were reviewed by me and considered in my medical decision making (see chart for details).    MDM Rules/Calculators/A&P                       39 year old female presents to the emergency department for evaluation of chest pain pain initially resolved 1 hour after taking omeprazole.  It recurred around midnight.  Her symptoms are quite atypical for ACS.  She denies any exertional component to her pain, shortness of breath, syncope, diaphoresis.  Her initial cardiac work-up has been reassuring with negative troponin.  No signs of acute ischemia on EKG.  Chest x-ray today negative for acute cardiopulmonary abnormality including pneumonia, pneumothorax, pleural effusion, mediastinal widening.  While she has been intermittently tachycardic, she has a history of this for at least the last 2 years.  She has had a negative D-dimer in the past.  Denies leg swelling, hemoptysis.  Plan for delta troponin.  If negative, feel the patient is stable for follow-up with her primary care doctor.  As an aside she is complaining of right-sided otalgia with evidence of mild acute otitis media.  Will start on amoxicillin.  Patient instructed to follow-up with her ENT physician.  Patient signed out to [place], [place] at shift change.   Final Clinical Impression(s) / ED Diagnoses Final diagnoses:  Nonspecific chest pain  Right otitis media, unspecified otitis media type    Rx / DC Orders ED Discharge Orders         Ordered    amoxicillin (AMOXIL) 500 MG capsule  3 times daily     06/08/19 0639    fluconazole (DIFLUCAN) 150 MG tablet   Once     06/08/19 1610           Antony Madura, PA-C 06/08/19 2244    Ward, Layla Maw, DO 06/08/19 2321

## 2019-06-08 NOTE — ED Notes (Addendum)
While in the lobby, pt c/o otalgia and think she may have an ear infection. She also is concerned about a spot on her abdomen near her glucometer.

## 2019-06-08 NOTE — ED Provider Notes (Signed)
Physical Exam  BP (!) 133/113   Pulse 100   Temp 98.1 F (36.7 C) (Oral)   Resp (!) 25   Ht 5\' 4"  (1.626 m)   Wt 102.1 kg   SpO2 100%   BMI 38.62 kg/m   Physical Exam Vitals and nursing note reviewed.  Constitutional:      General: She is not in acute distress.    Appearance: She is well-developed. She is not diaphoretic.  HENT:     Head: Normocephalic and atraumatic.  Eyes:     General: No scleral icterus.    Conjunctiva/sclera: Conjunctivae normal.  Pulmonary:     Effort: Pulmonary effort is normal. No respiratory distress.  Musculoskeletal:     Cervical back: Normal range of motion.  Skin:    Findings: No rash.  Neurological:     Mental Status: She is alert.     ED Course/Procedures     Procedures  MDM   Care of patient assumed from PA Humes at 7:00 AM.  Agree with history, physical exam and plan.  See their note for further details.  Briefly, 40 y.o. female with PMH/PSH as below who presents with a chief complaint of chest tightness.  Pain resolved after taking omeprazole.  Chest pain is nonexertional.  Work-up here including troponin and EKG are unremarkable. Of note, she has had intermittent tachycardia since 2018 with negative D-dimers.  Past Medical History:  Diagnosis Date  . Asthma   . Diabetes mellitus without complication (HCC)   . GERD (gastroesophageal reflux disease)   . Hypertension    Past Surgical History:  Procedure Laterality Date  . CESAREAN SECTION    . HERNIA REPAIR    . TUBAL LIGATION    . VENTRAL HERNIA REPAIR N/A 11/27/2017   Procedure: VENTRAL HERNIA REPAIR ERAS PATHWAY;  Surgeon: 11/29/2017, MD;  Location: [facility];  Service: General;  Laterality: N/A;  . WISDOM TOOTH EXTRACTION        Current Plan: Plan to check delta. Troponin.  If negative will discharge home with PCP follow-up.    MDM/ED Course: Delta troponin is negative. Will discharge home.    Consults: None    Significant  labs/images: Labs Reviewed  BASIC METABOLIC PANEL - Abnormal; Notable for the following components:      Result Value   CO2 21 (*)    Glucose, Bld 379 (*)    BUN 21 (*)    All other components within normal limits  CBG MONITORING, ED - Abnormal; Notable for the following components:   Glucose-Capillary 322 (*)    All other components within normal limits  CBG MONITORING, ED - Abnormal; Notable for the following components:   Glucose-Capillary 271 (*)    All other components within normal limits  CBG MONITORING, ED - Abnormal; Notable for the following components:   Glucose-Capillary 236 (*)    All other components within normal limits  CBC  I-STAT BETA HCG BLOOD, ED (NOT ORDERABLE)  TROPONIN I (HIGH SENSITIVITY)  TROPONIN I (HIGH SENSITIVITY)     I personally reviewed and interpreted all labs.   Patient is hemodynamically stable, in NAD, and able to ambulate in the ED. Evaluation does not show pathology that would require ongoing emergent intervention or inpatient treatment. I explained the diagnosis to the patient. Pain has been managed and has no complaints prior to discharge. Patient is comfortable with above plan and is stable for discharge at this time. All questions were answered prior to disposition.  Strict return precautions for returning to the ED were discussed. Encouraged follow up with PCP.   An After Visit Summary was printed and given to the patient.   Portions of this note were generated with [employer]. Dictation errors may occur despite best attempts at proofreading.       Delia Heady, PA-C 06/08/19 3888    Varney Biles, MD 06/08/19 1547

## 2019-06-09 NOTE — Telephone Encounter (Signed)
Attempt to reach patient to inform on the advising. No answer and LVM.

## 2019-06-17 MED FILL — AMOXICILLIN 500 MG CAPSULE: 500 | 7 days supply | Qty: 21 | Fill #0

## 2019-06-17 MED FILL — NovoLOG 100 UNIT/ML SOLN: 100 | 30 days supply | Qty: 60 | Fill #2

## 2019-06-17 MED FILL — FLUCONAZOLE 150 MG TABLET: 150 | 1 days supply | Qty: 1 | Fill #0

## 2019-06-19 ENCOUNTER — Other Ambulatory Visit: Payer: Self-pay

## 2019-06-19 ENCOUNTER — Ambulatory Visit: Payer: Medicaid Other | Attending: Nurse Practitioner | Admitting: *Deleted

## 2019-06-19 DIAGNOSIS — Z3042 Encounter for surveillance of injectable contraceptive: Secondary | ICD-10-CM

## 2019-06-19 MED ORDER — MEDROXYPROGESTERONE ACETATE 150 MG/ML IM SUSP
150.0000 mg | Freq: Once | INTRAMUSCULAR | Status: AC
  Administered 2019-06-19: 150 mg via INTRAMUSCULAR

## 2019-06-19 NOTE — Progress Notes (Signed)
Date last pap: 02/04/2019 Last Depo-Provera:[id number] Side Effects if any: n/a. Serum HCG indicated? n/a. Depo-Provera 150 mg IM administered in the right upper outer quadrant.   Next appointment due April 2nd and April 16th.

## 2019-06-22 ENCOUNTER — Encounter (HOSPITAL_COMMUNITY): Payer: Self-pay | Admitting: Emergency Medicine

## 2019-06-22 ENCOUNTER — Other Ambulatory Visit: Payer: Self-pay

## 2019-06-22 ENCOUNTER — Ambulatory Visit (HOSPITAL_COMMUNITY)
Admission: EM | Admit: 2019-06-22 | Discharge: 2019-06-22 | Disposition: A | Discharge status: Home / Self Care | Payer: Medicaid Other | Attending: Family Medicine | Admitting: Family Medicine

## 2019-06-22 DIAGNOSIS — R0981 Nasal congestion: Secondary | ICD-10-CM | POA: Insufficient documentation

## 2019-06-22 DIAGNOSIS — K219 Gastro-esophageal reflux disease without esophagitis: Secondary | ICD-10-CM | POA: Diagnosis not present

## 2019-06-22 DIAGNOSIS — Z888 Allergy status to other drugs, medicaments and biological substances status: Secondary | ICD-10-CM | POA: Diagnosis not present

## 2019-06-22 DIAGNOSIS — Z79899 Other long term (current) drug therapy: Secondary | ICD-10-CM | POA: Diagnosis not present

## 2019-06-22 DIAGNOSIS — E1140 Type 2 diabetes mellitus with diabetic neuropathy, unspecified: Secondary | ICD-10-CM | POA: Insufficient documentation

## 2019-06-22 DIAGNOSIS — Z825 Family history of asthma and other chronic lower respiratory diseases: Secondary | ICD-10-CM | POA: Insufficient documentation

## 2019-06-22 DIAGNOSIS — Z91041 Radiographic dye allergy status: Secondary | ICD-10-CM | POA: Diagnosis not present

## 2019-06-22 DIAGNOSIS — Z9641 Presence of insulin pump (external) (internal): Secondary | ICD-10-CM | POA: Insufficient documentation

## 2019-06-22 DIAGNOSIS — G4733 Obstructive sleep apnea (adult) (pediatric): Secondary | ICD-10-CM | POA: Insufficient documentation

## 2019-06-22 DIAGNOSIS — Z885 Allergy status to narcotic agent status: Secondary | ICD-10-CM | POA: Insufficient documentation

## 2019-06-22 DIAGNOSIS — J45909 Unspecified asthma, uncomplicated: Secondary | ICD-10-CM | POA: Diagnosis not present

## 2019-06-22 DIAGNOSIS — Z833 Family history of diabetes mellitus: Secondary | ICD-10-CM | POA: Insufficient documentation

## 2019-06-22 DIAGNOSIS — I10 Essential (primary) hypertension: Secondary | ICD-10-CM | POA: Diagnosis not present

## 2019-06-22 DIAGNOSIS — Z87891 Personal history of nicotine dependence: Secondary | ICD-10-CM | POA: Diagnosis not present

## 2019-06-22 DIAGNOSIS — Z794 Long term (current) use of insulin: Secondary | ICD-10-CM | POA: Diagnosis not present

## 2019-06-22 DIAGNOSIS — Z20822 Contact with and (suspected) exposure to covid-19: Secondary | ICD-10-CM | POA: Diagnosis not present

## 2019-06-22 DIAGNOSIS — E782 Mixed hyperlipidemia: Secondary | ICD-10-CM | POA: Insufficient documentation

## 2019-06-22 MED ORDER — CETIRIZINE HCL 10 MG PO CAPS: 10.0000 mg | ORAL_CAPSULE | Freq: Every day | ORAL | 0 refills | Status: DC

## 2019-06-22 MED ORDER — FLUTICASONE PROPIONATE 50 MCG/ACT NA SUSP: 1.0000 | Freq: Every day | NASAL | 0 refills | Status: DC

## 2019-06-22 NOTE — ED Triage Notes (Signed)
Pt here for sinus congestion and scratchy throat that started two days ago.  Pt was diagnosed with right ear infection 2 weeks ago, but did not pick up her antibiotics until this past Friday and still hasn't started taking them  Pt denies fever, h/a, body aches, fever, or loss of taste or smell.

## 2019-06-22 NOTE — Discharge Instructions (Signed)
COVID swab pending, monitor my chart for results Quarantine until results return- If positive quarantine for 10 days as well until symptoms improving/fever free Rest and drink plenty of fluids Monitor heart rate, ideal heart rate less than 100  Begin daily cetirizine and flonase nasal spray over the next 10 days  Follow up if not improving or worsening

## 2019-06-23 MED FILL — CETIRIZINE HCL 10 MG TABS: 10 | 10 days supply | Qty: 10 | Fill #0

## 2019-06-23 MED FILL — FLUTICASONE PROP 50 MCG SPR: 50 | 30 days supply | Qty: 16 | Fill #0

## 2019-06-24 NOTE — ED Provider Notes (Signed)
[facility]    CSN: [id number] Arrival date & time: 06/22/19  1907      History   Chief Complaint Chief Complaint  Patient presents with  . Nasal Congestion    HPI Sarah Garrison is a 39 y.o. female history of asthma, DM T 2 , GERD, HTN presenting today for evaluation of nasal congestion and throat irritation. Symptoms began about 2 days ago. Feels discomfort in her upper nose/sinus area. Otherwise feels normal- denies fever, chills, body aches, fatigue, change in smell/taste. Denies cough, Chest pain, SOB. Notes heart rate is typically elevated above 100. Has an apple watch she can monitor with. Last glucose noted was 220's. Has continuous glucose monitor and insulin pump.   HPI  Past Medical History:  Diagnosis Date  . Asthma   . Diabetes mellitus without complication (HCC)   . GERD (gastroesophageal reflux disease)   . Hypertension     Patient Active Problem List   Diagnosis Date Noted  . OSA (obstructive sleep apnea) 02/16/2019  . 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 Infective otitis externa of left ear 01/01/2019  . Essential hypertension 06/22/2018  . Mixed hyperlipidemia 06/22/2018  . Uncontrolled type 2 diabetes mellitus with complication, with long-term current use of insulin (HCC) 12/13/2016  . Non-adherence to medical treatment 12/13/2016  . Asthma 07/30/2016  . 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/07/2016  . Encounter for pregnancy test 02/01/2016  . Leg swelling 09/29/2015  . Morbid obesity, unspecified obesity type (HCC) 09/29/2015  . DM neuropathy, type II diabetes mellitus (HCC) 08/24/2015    Past Surgical History:  Procedure Laterality Date  . CESAREAN SECTION    . HERNIA REPAIR    . TUBAL LIGATION    . VENTRAL HERNIA REPAIR N/A 11/27/2017   Procedure: VENTRAL HERNIA REPAIR ERAS PATHWAY;  Surgeon: Harriette Bouillon, MD;  Location: [facility];  Service: General;  Laterality: N/A;  . WISDOM TOOTH EXTRACTION      OB History    Gravida  5   Para  4   Term      Preterm      AB  1   Living  4     SAB  1   TAB      Ectopic      Multiple      Live Births               Home Medications    Prior to Admission medications   Medication Sig Start Date End Date Taking? Authorizing Provider  albuterol (VENTOLIN HFA) 108 (90 Base) MCG/ACT inhaler Inhale 1-2 puffs into the lungs every 6 (six) hours as needed for wheezing or shortness of breath. 01/01/19  Yes Storm Frisk, MD  insulin aspart (NOVOLOG) 100 UNIT/ML injection Use via insulin pump. Max TDD 200 units 03/25/19  Yes [provider]  Insulin Disposable Pump (OMNIPOD DASH 5 PACK PODS) MISC U ONE POD Q 2 DAYS UTD 12/23/18  Yes [provider]  Insulin Human (INSULIN PUMP) SOLN Inject into the skin. Novolog 100 units/ml  Basal and Bolus   Yes [provider]  amoxicillin (AMOXIL) 500 MG capsule Take 1 capsule (500 mg total) by mouth 3 (three) times daily. 06/08/19   Antony Madura, PA-C  atorvastatin (LIPITOR) 20 MG tablet Take 1 tablet (20 mg total) by mouth daily. Patient not taking: Reported on 03/23/2019 11/23/17   Claiborne Rigg, NP  calcium carbonate (TITRALAC) 420 MG CHEW chewable tablet Chew 420 mg by mouth daily.  [provider]  Cetirizine HCl 10 MG CAPS Take 1 capsule (10 mg total) by mouth daily for 10 days. 06/22/19 07/02/19  Libbey Duce C, PA-C  fluticasone (FLONASE) 50 MCG/ACT nasal spray Place 1-2 sprays into both nostrils daily for 7 days. 06/22/19 06/29/19  Mattew Chriswell C, PA-C  lisinopril (ZESTRIL) 20 MG tablet Take 1 tablet (20 mg total) by mouth daily. Patient not taking: Reported on 03/23/2019 12/19/18   Claiborne Rigg, NP  medroxyPROGESTERone Acetate (DEPO-PROVERA IM) Inject 150 mLs into the muscle every 3 (three) months.     [provider]  meloxicam (MOBIC) 7.5 MG tablet Take 1 tablet (7.5 mg total) by mouth daily. Patient not taking: Reported on 03/23/2019 01/01/19   Storm Frisk, MD    Misc. Devices MISC CPAP therapy on autopap 10-20. Needs Small size Fisher[employer]Paykel Full Face Mask Simplus  mask and heated humidification. 04/28/19   Claiborne Rigg, NP  omeprazole (PRILOSEC) 40 MG capsule Take 1 capsule (40 mg total) by mouth daily. Patient not taking: Reported on 03/23/2019 06/18/18   Sherrilyn Rist, MD  sitaGLIPtin (JANUVIA) 100 MG tablet Take 100 mg by mouth daily.  11/13/18   [provider]  TRUEPLUS PEN NEEDLES 32G X 4 MM MISC USE 3 TIMES DAILY AS DIRECTED 06/16/18   Hoy Register, MD    Family History Family History  Problem Relation Age of Onset  . Asthma Mother   . Kidney failure Mother   . Brain cancer Mother   . Asthma Father   . Other Father        surgery on stomach but don't know from what  . Diabetes Brother   . Diabetes Maternal Grandmother     Social History Social History   Tobacco Use  . Smoking status: Former Smoker    Packs/day: 0.10    Types: Cigarettes    Quit date: 11/27/2017    Years since quitting: 1.5  . Smokeless tobacco: Never Used  Substance Use Topics  . Alcohol use: No  . Drug use: No     Allergies   Morphine and related, Glyburide, Ivp dye [iodinated diagnostic agents], Oxycodone, and Vicodin [hydrocodone-acetaminophen]   Review of Systems Review of Systems  Constitutional: Negative for activity change, appetite change, chills, fatigue and fever.  HENT: Positive for congestion, rhinorrhea, sinus pressure and sore throat. Negative for ear pain and trouble swallowing.   Eyes: Negative for discharge and redness.  Respiratory: Negative for cough, chest tightness and shortness of breath.   Cardiovascular: Negative for chest pain.  Gastrointestinal: Negative for abdominal pain, diarrhea, nausea and vomiting.  Musculoskeletal: Negative for myalgias.  Skin: Negative for rash.  Neurological: Negative for dizziness, light-headedness and headaches.     Physical Exam Triage Vital Signs ED Triage Vitals  Enc  Vitals Group     BP 06/22/19 1937 (!) 133/93     Pulse Rate 06/22/19 1937 (!) 117     Resp 06/22/19 1937 (!) 24     Temp 06/22/19 1937 99.4 F (37.4 C)     Temp Source 06/22/19 1937 Oral     SpO2 06/22/19 1937 97 %     Weight --      Height --      Head Circumference --      Peak Flow --      Pain Score 06/22/19 1933 0     Pain Loc --      Pain Edu? --  Excl. in GC? --    No data found.  Updated Vital Signs BP (!) 133/93 (BP Location: Right Arm) Comment: Pt has not had Lisinopril in over one week  Pulse (!) 117   Temp 99.4 F (37.4 C) (Oral)   Resp (!) 24   SpO2 97%   Visual Acuity Right Eye Distance:   Left Eye Distance:   Bilateral Distance:    Right Eye Near:   Left Eye Near:    Bilateral Near:     Physical Exam Vitals and nursing note reviewed.  Constitutional:      General: She is not in acute distress.    Appearance: She is well-developed.  HENT:     Head: Normocephalic and atraumatic.     Ears:     Comments: Bilateral ears without tenderness to palpation of external auricle, tragus and mastoid, EAC's without erythema or swelling, TM's with good bony landmarks and cone of light. Non erythematous.     Nose:     Comments: Mildly erythematous nasal mucosa    Mouth/Throat:     Comments: Oral mucosa pink and moist, no tonsillar enlargement or exudate. Posterior pharynx patent and nonerythematous, no uvula deviation or swelling. Normal phonation.  Eyes:     Conjunctiva/sclera: Conjunctivae normal.  Cardiovascular:     Rate and Rhythm: Regular rhythm. Tachycardia present.     Heart sounds: No murmur.  Pulmonary:     Effort: Pulmonary effort is normal. No respiratory distress.     Breath sounds: Normal breath sounds.     Comments: Breathing comfortably at rest, CTABL, no wheezing, rales or other adventitious sounds auscultated Abdominal:     Palpations: Abdomen is soft.     Tenderness: There is no abdominal tenderness.  Musculoskeletal:     Cervical  back: Neck supple.     Comments: Bilateral lower legs symmetric, no calf swelling or tenderness  Skin:    General: Skin is warm and dry.  Neurological:     Mental Status: She is alert.      UC Treatments / Results  Labs (all labs ordered are listed, but only abnormal results are displayed) Labs Reviewed  NOVEL CORONAVIRUS, NAA (HOSP ORDER, SEND-OUT TO REF LAB; TAT 18-24 HRS)    EKG   Radiology No results found.  Procedures Procedures (including critical care time)  Medications Ordered in UC Medications - No data to display  Initial Impression / Assessment and Plan / UC Course  I have reviewed the triage vital signs and the nursing notes.  Pertinent labs & imaging results that were available during my care of the patient were reviewed by me and considered in my medical decision making (see chart for details).     Nasal congestion with cetrizine and flonase. COVID PCR pending to rule out. Patient tachycardic, seems slightly normal for patient, recommended to continue to monitor, focus on hydration. O2 stable. Do not suspect PE at this time, no chest symptoms.    Discussed strict return precautions. Patient verbalized understanding and is agreeable with plan.  Final Clinical Impressions(s) / UC Diagnoses   Final diagnoses:  Nasal congestion     Discharge Instructions     COVID swab pending, monitor my chart for results Quarantine until results return- If positive quarantine for 10 days as well until symptoms improving/fever free Rest and drink plenty of fluids Monitor heart rate, ideal heart rate less than 100  Begin daily cetirizine and flonase nasal spray over the next 10 days  Follow up  if not improving or worsening   ED Prescriptions    Medication Sig Dispense Auth. Provider   Cetirizine HCl 10 MG CAPS Take 1 capsule (10 mg total) by mouth daily for 10 days. 10 capsule Apolonia Ellwood C, PA-C   fluticasone (FLONASE) 50 MCG/ACT nasal spray Place 1-2 sprays  into both nostrils daily for 7 days. 1 g Candy Leverett, [place] C, PA-C     PDMP not reviewed this encounter.   Isaih Bulger, [place] C, PA-C 06/24/19 1002

## 2019-06-25 LAB — NOVEL CORONAVIRUS, NAA (HOSP ORDER, SEND-OUT TO REF LAB; TAT 18-24 HRS): SARS-CoV-2, NAA: NOT DETECTED

## 2019-06-30 MED FILL — metFORMIN HCL ER 500 MG TB2: 500 | 30 days supply | Qty: 60 | Fill #0

## 2019-07-14 ENCOUNTER — Other Ambulatory Visit: Payer: Self-pay

## 2019-07-14 ENCOUNTER — Ambulatory Visit: Payer: Medicaid Other | Admitting: Sports Medicine

## 2019-07-14 ENCOUNTER — Encounter: Payer: Self-pay | Admitting: Sports Medicine

## 2019-07-14 ENCOUNTER — Ambulatory Visit (INDEPENDENT_AMBULATORY_CARE_PROVIDER_SITE_OTHER): Payer: Medicaid Other

## 2019-07-14 DIAGNOSIS — M779 Enthesopathy, unspecified: Secondary | ICD-10-CM

## 2019-07-14 DIAGNOSIS — M79671 Pain in right foot: Secondary | ICD-10-CM

## 2019-07-14 DIAGNOSIS — E1165 Type 2 diabetes mellitus with hyperglycemia: Secondary | ICD-10-CM

## 2019-07-14 DIAGNOSIS — E1142 Type 2 diabetes mellitus with diabetic polyneuropathy: Secondary | ICD-10-CM

## 2019-07-14 DIAGNOSIS — M7751 Other enthesopathy of right foot: Secondary | ICD-10-CM

## 2019-07-14 DIAGNOSIS — IMO0002 Reserved for concepts with insufficient information to code with codable children: Secondary | ICD-10-CM

## 2019-07-14 DIAGNOSIS — M729 Fibroblastic disorder, unspecified: Secondary | ICD-10-CM

## 2019-07-14 MED ORDER — DICLOFENAC SODIUM 75 MG PO TBEC: 75.0000 mg | DELAYED_RELEASE_TABLET | Freq: Two times a day (BID) | ORAL | 0 refills | Status: DC

## 2019-07-14 MED FILL — DICLOFENAC SOD EC 75 MG TAB: 75 | 30 days supply | Qty: 60 | Fill #0

## 2019-07-14 NOTE — Progress Notes (Signed)
Subjective: Sarah Garrison is a 39 y.o. female patient presents to office with complaint of moderate heel pain on the right greater than left as well as pain on top of the right foot that radiates all the way up to the hip with knee pain as well.  Patient reports that pain has been getting worse over the last 1 to 5 months reports that it is worse after period of long standing and walking causes her to limp reports during the day pain is 7 out of 10 however at night pain is 10 out of 10 reports that nothing has helped patient also reports that she is diabetic with last blood sugar recorded at 400 because she is out of her insulin cartridges.  Denies any other pedal complaints.   Patient Active Problem List   Diagnosis Date Noted  . OSA (obstructive sleep apnea) 02/16/2019  . 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 Infective otitis externa of left ear 01/01/2019  . Essential hypertension 06/22/2018  . Mixed hyperlipidemia 06/22/2018  . Uncontrolled type 2 diabetes mellitus with complication, with long-term current use of insulin ([place]) 12/13/2016  . Non-adherence to medical treatment 12/13/2016  . Asthma 07/30/2016  . 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/07/2016  . Encounter for pregnancy test 02/01/2016  . Leg swelling 09/29/2015  . Morbid obesity, unspecified obesity type ([place]) 09/29/2015  . DM neuropathy, type II diabetes mellitus ([place]) 08/24/2015    Current Outpatient Medications on File Prior to Visit  Medication Sig Dispense Refill  . albuterol (VENTOLIN HFA) 108 (90 Base) MCG/ACT inhaler Inhale 1-2 puffs into the lungs every 6 (six) hours as needed for wheezing or shortness of breath. 18 g 1  . atorvastatin (LIPITOR) 20 MG tablet Take 1 tablet (20 mg total) by mouth daily. 90 tablet 3  . calcium carbonate (TITRALAC) 420 MG CHEW chewable tablet Chew 420 mg by mouth daily.    . insulin aspart (NOVOLOG) 100 UNIT/ML injection Use via insulin pump. Max TDD 200 units    . Insulin Disposable Pump (OMNIPOD DASH 5  PACK PODS) MISC U ONE POD Q 2 DAYS UTD    . Insulin Human (INSULIN PUMP) SOLN Inject into the skin. Novolog 100 units/ml  Basal and Bolus    . lisinopril (ZESTRIL) 20 MG tablet Take 1 tablet (20 mg total) by mouth daily. 90 tablet 3  . medroxyPROGESTERone Acetate (DEPO-PROVERA IM) Inject 150 mLs into the muscle every 3 (three) months.     . meloxicam (MOBIC) 7.5 MG tablet Take 1 tablet (7.5 mg total) by mouth daily. 30 tablet 0  . metFORMIN (GLUCOPHAGE-XR) 500 MG 24 hr tablet Start with 1 tablet daily for a week, then increase to 2 tablets daily    . Misc. Devices MISC CPAP therapy on autopap 10-20. Needs Small size Fisher[employer]kel Full Face Mask Simplus  mask and heated humidification. 1 each 0  . omeprazole (PRILOSEC) 40 MG capsule Take 1 capsule (40 mg total) by mouth daily. 90 capsule 3  . sitaGLIPtin (JANUVIA) 100 MG tablet Take 100 mg by mouth daily.     . TRUEPLUS PEN NEEDLES 32G X 4 MM MISC USE 3 TIMES DAILY AS DIRECTED 100 each 5  . amoxicillin (AMOXIL) 500 MG capsule Take 1 capsule (500 mg total) by mouth 3 (three) times daily. (Patient not taking: Reported on 07/14/2019) 21 capsule 0  . Cetirizine HCl 10 MG CAPS Take 1 capsule (10 mg total) by mouth daily for 10 days. 10 capsule 0  . fluticasone (FLONASE) 50 MCG/ACT  nasal spray Place 1-2 sprays into both nostrils daily for 7 days. 1 g 0   No current facility-administered medications on file prior to visit.    Allergies  Allergen Reactions  . Morphine And Related Shortness Of Breath  . Glyburide Diarrhea and Other (See Comments)    Reaction:  Nose bleeds   . Ivp Dye [Iodinated Diagnostic Agents] Other (See Comments)    Shortness of breath.    . Oxycodone Other (See Comments)    Pt states that this medication makes her "dream about rabbits chasing" her.    . Vicodin [Hydrocodone-Acetaminophen] Other (See Comments)    Pt states that this medication makes her "dream about rabbits chasing" her.      Objective: Physical  Exam General: The patient is alert and oriented x3 in no acute distress.  Dermatology: Skin is warm, dry and supple bilateral lower extremities. Nails 1-10 are normal. There is no erythema, edema, no eccymosis, no open lesions present. Integument is otherwise unremarkable.  Vascular: Dorsalis Pedis pulse and Posterior Tibial pulse are 2/4 bilateral. Capillary fill time is immediate to all digits.  Neurological: Grossly intact to light touch with an achilles reflex of +2/5 and a  negative Tinel's sign bilateral.  Musculoskeletal: Tenderness to palpation at the medial calcaneal tubercale and through the insertion of the plantar fascia on the right greater than left foot.  There is also pain diffusely over the dorsal forefoot on the right.  No pain with compression of calcaneus bilateral. No pain with tuning fork to calcaneus bilateral. No pain with calf compression bilateral. There is decreased Ankle joint range of motion bilateral. All other joints range of motion within normal limits bilateral. Strength 5/5 in all groups bilateral.  Pes planus foot type bilateral.  Gait: Unassisted, Antalgic avoid weight on right heel  Xray, Right/Left foot:  Normal osseous mineralization. Joint spaces preserved. No fracture/dislocation/boney destruction. Calcaneal spur present with mild thickening of plantar fascia. No other soft tissue abnormalities or radiopaque foreign bodies.   Assessment and Plan: Problem List Items Addressed This Visit      Endocrine   Uncontrolled type 2 diabetes mellitus with complication, with long-term current use of insulin (HCC)   Relevant Medications   metFORMIN (GLUCOPHAGE-XR) 500 MG 24 hr tablet    Other Visit Diagnoses    Right foot pain    -  Primary   Relevant Orders   DG Foot Complete Right   Fasciitis       Tendonitis       Diabetic polyneuropathy associated with type 2 diabetes mellitus (HCC)       Relevant Medications   metFORMIN (GLUCOPHAGE-XR) 500 MG 24 hr  tablet      -Complete examination performed.  -Xrays reviewed -Discussed with patient in detail the condition of plantar fasciitis/tendinitis, how this occurs and general treatment options. Explained both conservative and surgical treatments.  -Patient declined steroid injection at this time past history of a bad experience -Rx Diclofenac  -Recommended good supportive shoes and advised use of OTC insert/heel lifts as dispensed at this visit -Explained and dispensed to patient daily stretching exercises. -Recommend patient to ice affected area 1-2x daily. -Work excuse given for today -Patient to return to office in 4 weeks for follow up or sooner if problems or questions arise.  Asencion Islam, DPM

## 2019-07-15 ENCOUNTER — Other Ambulatory Visit: Payer: Self-pay | Admitting: Sports Medicine

## 2019-07-15 DIAGNOSIS — M779 Enthesopathy, unspecified: Secondary | ICD-10-CM

## 2019-07-27 ENCOUNTER — Encounter: Payer: Self-pay | Admitting: Nurse Practitioner

## 2019-07-27 ENCOUNTER — Telehealth: Payer: Self-pay | Admitting: Nurse Practitioner

## 2019-07-27 NOTE — Telephone Encounter (Signed)
Patient called requesting to speak with pcp regarding cpap settings. Patient stated that her current setting on the cpap is 10 and it makes her chest feel heavy and takes her breath away. Patient stated she would like to go back down to 5. Please follow up at your earliest convenience.

## 2019-07-30 NOTE — Telephone Encounter (Signed)
Will route to PCP for advising.  

## 2019-07-31 ENCOUNTER — Encounter: Payer: Self-pay | Admitting: Nurse Practitioner

## 2019-07-31 ENCOUNTER — Other Ambulatory Visit: Payer: Self-pay | Admitting: Nurse Practitioner

## 2019-07-31 DIAGNOSIS — G4733 Obstructive sleep apnea (adult) (pediatric): Secondary | ICD-10-CM

## 2019-07-31 NOTE — Telephone Encounter (Signed)
Contacted patient via mychart

## 2019-08-18 ENCOUNTER — Encounter: Payer: Self-pay | Admitting: Sports Medicine

## 2019-08-18 ENCOUNTER — Other Ambulatory Visit: Payer: Self-pay

## 2019-08-18 ENCOUNTER — Ambulatory Visit: Payer: Medicaid Other | Admitting: Sports Medicine

## 2019-08-18 VITALS — Temp 98.1°F

## 2019-08-18 DIAGNOSIS — IMO0002 Reserved for concepts with insufficient information to code with codable children: Secondary | ICD-10-CM

## 2019-08-18 DIAGNOSIS — M729 Fibroblastic disorder, unspecified: Secondary | ICD-10-CM

## 2019-08-18 DIAGNOSIS — Z794 Long term (current) use of insulin: Secondary | ICD-10-CM

## 2019-08-18 DIAGNOSIS — M79671 Pain in right foot: Secondary | ICD-10-CM

## 2019-08-18 DIAGNOSIS — E1165 Type 2 diabetes mellitus with hyperglycemia: Secondary | ICD-10-CM

## 2019-08-18 DIAGNOSIS — E118 Type 2 diabetes mellitus with unspecified complications: Secondary | ICD-10-CM

## 2019-08-18 NOTE — Patient Instructions (Signed)
Pre-Operative Instructions  Congratulations, you have decided to take an important step towards improving your quality of life.  You can be assured that the doctors and staff at [facility] will be with you every step of the way.  Here are some important things you should know:  1. Plan to be at the surgery center/hospital at least 1 (one) hour prior to your scheduled time, unless otherwise directed by the surgical center/hospital staff.  You must have a responsible adult accompany you, remain during the surgery and drive you home.  Make sure you have directions to the surgical center/hospital to ensure you arrive on time. 2. If you are having surgery at [facility] or [facility], you will need a copy of your medical history and physical form from your family physician within one month prior to the date of surgery. We will give you a form for your primary physician to complete.  3. We make every effort to accommodate the date you request for surgery.  However, there are times where surgery dates or times have to be moved.  We will contact you as soon as possible if a change in schedule is required.   4. No aspirin/ibuprofen for one week before surgery.  If you are on aspirin, any non-steroidal anti-inflammatory medications (Mobic, Aleve, Ibuprofen) should not be taken seven (7) days prior to your surgery.  You make take Tylenol for pain prior to surgery.  5. Medications - If you are taking daily heart and blood pressure medications, seizure, reflux, allergy, asthma, anxiety, pain or diabetes medications, make sure you notify the surgery center/hospital before the day of surgery so they can tell you which medications you should take or avoid the day of surgery. 6. No food or drink after midnight the night before surgery unless directed otherwise by surgical center/hospital staff. 7. No alcoholic beverages 24-hours prior to surgery.  No smoking 24-hours prior or 24-hours after  surgery. 8. Wear loose pants or shorts. They should be loose enough to fit over bandages, boots, and casts. 9. Don't wear slip-on shoes. Sneakers are preferred. 10. Bring your boot with you to the surgery center/hospital.  Also bring crutches or a walker if your physician has prescribed it for you.  If you do not have this equipment, it will be provided for you after surgery. 11. If you have not been contacted by the surgery center/hospital by the day before your surgery, call to confirm the date and time of your surgery. 12. Leave-time from work may vary depending on the type of surgery you have.  Appropriate arrangements should be made prior to surgery with your employer. 13. Prescriptions will be provided immediately following surgery by your doctor.  Fill these as soon as possible after surgery and take the medication as directed. Pain medications will not be refilled on weekends and must be approved by the doctor. 14. Remove nail polish on the operative foot and avoid getting pedicures prior to surgery. 15. Wash the night before surgery.  The night before surgery wash the foot and leg well with water and the antibacterial soap provided. Be sure to pay special attention to beneath the toenails and in between the toes.  Wash for at least three (3) minutes. Rinse thoroughly with water and dry well with a towel.  Perform this wash unless told not to do so by your physician.  Enclosed: 1 Ice pack (please put in freezer the night before surgery)   1 Hibiclens skin cleaner     Pre-op instructions  If you have any questions regarding the instructions, please do not hesitate to call our office.  [place]: [address], [place], [place] [address] -- [phone]  [place]: [address] [place], [place] [address] -- [phone]  [place]: [address], [place],  [address] -- [phone]   Website: [email or web address] 

## 2019-08-18 NOTE — Progress Notes (Addendum)
Subjective: Sarah Garrison is a 39 y.o. female returns to office for follow up evaluation of right heel pain, reports that still hurt and pain is bad that she had to take a week off of work, diclofenac and pain cream is not working. Patient reports that she DOES NOT WANT INJECTIONS, patient denies any recent changes in medications or new problems since last visit.   Patient Active Problem List   Diagnosis Date Noted  . OSA (obstructive sleep apnea) 02/16/2019  . 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 Infective otitis externa of left ear 01/01/2019  . Essential hypertension 06/22/2018  . Mixed hyperlipidemia 06/22/2018  . Uncontrolled type 2 diabetes mellitus with complication, with long-term current use of insulin (HCC) 12/13/2016  . Non-adherence to medical treatment 12/13/2016  . Asthma 07/30/2016  . 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/07/2016  . Encounter for pregnancy test 02/01/2016  . Leg swelling 09/29/2015  . Morbid obesity, unspecified obesity type (HCC) 09/29/2015  . DM neuropathy, type II diabetes mellitus (HCC) 08/24/2015    Current Outpatient Medications on File Prior to Visit  Medication Sig Dispense Refill  . albuterol (VENTOLIN HFA) 108 (90 Base) MCG/ACT inhaler Inhale 1-2 puffs into the lungs every 6 (six) hours as needed for wheezing or shortness of breath. 18 g 1  . amoxicillin (AMOXIL) 500 MG capsule Take 1 capsule (500 mg total) by mouth 3 (three) times daily. 21 capsule 0  . atorvastatin (LIPITOR) 20 MG tablet Take 1 tablet (20 mg total) by mouth daily. 90 tablet 3  . calcium carbonate (TITRALAC) 420 MG CHEW chewable tablet Chew 420 mg by mouth daily.    . diclofenac (VOLTAREN) 75 MG EC tablet Take 1 tablet (75 mg total) by mouth 2 (two) times daily. 60 tablet 0  . insulin aspart (NOVOLOG) 100 UNIT/ML injection Use via insulin pump. Max TDD 200 units    . Insulin Disposable Pump (OMNIPOD DASH 5 PACK PODS) MISC U ONE POD Q 2 DAYS UTD    . Insulin Human (INSULIN PUMP) SOLN Inject into the  skin. Novolog 100 units/ml  Basal and Bolus    . lisinopril (ZESTRIL) 20 MG tablet Take 1 tablet (20 mg total) by mouth daily. 90 tablet 3  . medroxyPROGESTERone Acetate (DEPO-PROVERA IM) Inject 150 mLs into the muscle every 3 (three) months.     . meloxicam (MOBIC) 7.5 MG tablet Take 1 tablet (7.5 mg total) by mouth daily. 30 tablet 0  . metFORMIN (GLUCOPHAGE-XR) 500 MG 24 hr tablet Start with 1 tablet daily for a week, then increase to 2 tablets daily    . Misc. Devices MISC CPAP therapy on autopap 10-20. Needs Small size [employer] Full Face Mask Simplus  mask and heated humidification. 1 each 0  . omeprazole (PRILOSEC) 40 MG capsule Take 1 capsule (40 mg total) by mouth daily. 90 capsule 3  . sitaGLIPtin (JANUVIA) 100 MG tablet Take 100 mg by mouth daily.     . TRUEPLUS PEN NEEDLES 32G X 4 MM MISC USE 3 TIMES DAILY AS DIRECTED 100 each 5  . Cetirizine HCl 10 MG CAPS Take 1 capsule (10 mg total) by mouth daily for 10 days. 10 capsule 0  . fluticasone (FLONASE) 50 MCG/ACT nasal spray Place 1-2 sprays into both nostrils daily for 7 days. 1 g 0   No current facility-administered medications on file prior to visit.    Allergies  Allergen Reactions  . Morphine And Related Shortness Of Breath  . Glyburide Diarrhea and Other (See Comments)  Reaction:  Nose bleeds   . Ivp Dye [Iodinated Diagnostic Agents] Other (See Comments)    Shortness of breath.    . Oxycodone Other (See Comments)    Pt states that this medication makes her "dream about rabbits chasing" her.    . Vicodin [Hydrocodone-Acetaminophen] Other (See Comments)    Pt states that this medication makes her "dream about rabbits chasing" her.     Family History  Problem Relation Age of Onset  . Asthma Mother   . Kidney failure Mother   . Brain cancer Mother   . Asthma Father   . Other Father        surgery on stomach but don't know from what  . Diabetes Brother   . Diabetes Maternal Grandmother     Past Surgical  History:  Procedure Laterality Date  . CESAREAN SECTION    . HERNIA REPAIR    . TUBAL LIGATION    . VENTRAL HERNIA REPAIR N/A 11/27/2017   Procedure: VENTRAL HERNIA REPAIR ERAS PATHWAY;  Surgeon: Harriette Bouillon, MD;  Location: [facility];  Service: General;  Laterality: N/A;  . WISDOM TOOTH EXTRACTION      Social History   Socioeconomic History  . Marital status: Single    Spouse name: Not on file  . Number of children: 4  . Years of education: Not on file  . Highest education level: Not on file  Occupational History  . Not on file  Tobacco Use  . Smoking status: Former Smoker    Packs/day: 0.10    Types: Cigarettes    Quit date: 11/27/2017    Years since quitting: 1.7  . Smokeless tobacco: Never Used  Substance and Sexual Activity  . Alcohol use: No  . Drug use: No  . Sexual activity: Not on file  Other Topics Concern  . Not on file  Social History Narrative  . Not on file   Social Determinants of Health   Financial Resource Strain:   . Difficulty of Paying Living Expenses:   Food Insecurity:   . Worried About Programme researcher, broadcasting/film/video in the Last Year:   . Barista in the Last Year:   Transportation Needs:   . Freight forwarder (Medical):   Marland Kitchen Lack of Transportation (Non-Medical):   Physical Activity:   . Days of Exercise per Week:   . Minutes of Exercise per Session:   Stress:   . Feeling of Stress :   Social Connections:   . Frequency of Communication with Friends and Family:   . Frequency of Social Gatherings with Friends and Family:   . Attends Religious Services:   . Active Member of Clubs or Organizations:   . Attends Banker Meetings:   Marland Kitchen Marital Status:     Objective:   General:  Alert and oriented x 3, in no acute distress  Dermatology: Skin is warm, dry, and supple bilateral. Nails are within normal limits. There is no lower extremity erythema, no eccymosis, no open lesions present bilateral.   Vascular:  Dorsalis Pedis and Posterior Tibial pedal pulses are 2/4 bilateral. + hair growth noted bilateral. Capillary Fill Time is 3 seconds in all digits. No varicosities, No edema bilateral lower extremities.   Neurological: Sensation grossly intact to light touch with an achilles reflex of +2 and a  negative Tinel's sign bilateral. Vibratory, sharp/dull, Semmes Weinstein Monofilament within normal limits.   Musculoskeletal: There is tenderness to palpation at the medial calcaneal  tubercale and through the insertion of the plantar fascia on the right foot. NO PAIN TO LEFT. No pain with compression to calcaneus or application of tuning fork. There is decreased Ankle joint range of motion bilateral. All other jointsrange of motion  within normal limits bilateral. +Pes planus. Strength 5/5 bilateral.   Assessment and Plan: Problem List Items Addressed This Visit      Endocrine   Uncontrolled type 2 diabetes mellitus with complication, with long-term current use of insulin ([place])    Other Visit Diagnoses    Fasciitis    -  Primary   Right foot pain          -Complete examination performed.  -Previous x-rays reviewed. -Re-Discussed with patient in detail the condition of plantar fasciitis, how this  occurs related to the foot type of the patient and general treatment options. - Patient refused injection -Patient opt for surgical management. Consent obtained for R EPF. Pre and Post op course explained. Risks, benefits, alternatives explained. No guarantees given or implied. Surgical booking slip submitted and provided patient with Surgical packet and info for [facility] -We will need medical clearance letter from PCP and advised patient to work on controlling her blood sugars -To dispense CAM Walker and crutches to use post op at surgical center  -Continue with stretching, icing, good supportive shoes, inserts daily.  -Patient to return to office after surgery or sooner if problems or questions  arise.  Landis Martins, DPM

## 2019-08-24 ENCOUNTER — Encounter: Payer: Self-pay | Admitting: Sports Medicine

## 2019-08-25 MED FILL — JANUVIA 100 MG TABLET: 100 | 30 days supply | Qty: 30 | Fill #0

## 2019-08-26 ENCOUNTER — Other Ambulatory Visit (HOSPITAL_COMMUNITY)
Admission: RE | Admit: 2019-08-26 | Discharge: 2019-08-26 | Disposition: A | Discharge status: Home / Self Care | Payer: Medicaid Other | Source: Ambulatory Visit | Attending: Internal Medicine | Admitting: Internal Medicine

## 2019-08-26 DIAGNOSIS — Z01812 Encounter for preprocedural laboratory examination: Secondary | ICD-10-CM | POA: Insufficient documentation

## 2019-08-26 DIAGNOSIS — Z20822 Contact with and (suspected) exposure to covid-19: Secondary | ICD-10-CM | POA: Diagnosis not present

## 2019-08-26 LAB — SARS CORONAVIRUS 2 (TAT 6-24 HRS): SARS Coronavirus 2: NEGATIVE

## 2019-08-28 ENCOUNTER — Other Ambulatory Visit: Payer: Self-pay

## 2019-08-28 ENCOUNTER — Ambulatory Visit (HOSPITAL_BASED_OUTPATIENT_CLINIC_OR_DEPARTMENT_OTHER): Payer: Medicaid Other | Attending: Nurse Practitioner | Admitting: Internal Medicine

## 2019-08-28 ENCOUNTER — Telehealth: Payer: Self-pay

## 2019-08-28 DIAGNOSIS — G4733 Obstructive sleep apnea (adult) (pediatric): Secondary | ICD-10-CM | POA: Diagnosis present

## 2019-08-28 DIAGNOSIS — Z9989 Dependence on other enabling machines and devices: Secondary | ICD-10-CM | POA: Insufficient documentation

## 2019-08-28 DIAGNOSIS — Z794 Long term (current) use of insulin: Secondary | ICD-10-CM | POA: Diagnosis not present

## 2019-08-28 NOTE — Telephone Encounter (Signed)
Spoke to Sarah Sorrow, NP office about getting medical clearance for surgery on 08/31/19. She is not able to give clearance due to the patients last A1c in January was 11%. Her office faxed me this information and I will send to the scan center to be added to the patients chart.  Spoke to the patient to inform her that we can not proceed with the surgery on 08/31/19 due to her A1c. I requested her contact her PCP to get new lab work done so we could get the surgery rescheduled. I also notified Dr. Marylene Land and Aram Beecham at [facility] to cancel the surgery.  Sarah Garrison will contact me as soon as she gets her lab work done so that we can get her rescheduled for surgery.

## 2019-08-31 ENCOUNTER — Other Ambulatory Visit: Payer: Self-pay

## 2019-09-02 MED FILL — NovoLOG 100 UNIT/ML SOLN: 100 | 30 days supply | Qty: 60 | Fill #3

## 2019-09-02 MED FILL — JANUVIA 100 MG TABLET: 100 | 30 days supply | Qty: 30 | Fill #0

## 2019-09-05 ENCOUNTER — Emergency Department (HOSPITAL_COMMUNITY): Payer: Medicaid Other

## 2019-09-05 ENCOUNTER — Other Ambulatory Visit: Payer: Self-pay

## 2019-09-05 ENCOUNTER — Emergency Department (HOSPITAL_COMMUNITY)
Admission: EM | Admit: 2019-09-05 | Discharge: 2019-09-05 | Disposition: A | Discharge status: Home / Self Care | Payer: Medicaid Other | Attending: Emergency Medicine | Admitting: Emergency Medicine

## 2019-09-05 ENCOUNTER — Encounter (HOSPITAL_COMMUNITY): Payer: Self-pay | Admitting: *Deleted

## 2019-09-05 DIAGNOSIS — Y999 Unspecified external cause status: Secondary | ICD-10-CM | POA: Insufficient documentation

## 2019-09-05 DIAGNOSIS — S4992XA Unspecified injury of left shoulder and upper arm, initial encounter: Secondary | ICD-10-CM | POA: Diagnosis present

## 2019-09-05 DIAGNOSIS — W19XXXA Unspecified fall, initial encounter: Secondary | ICD-10-CM | POA: Insufficient documentation

## 2019-09-05 DIAGNOSIS — Z87891 Personal history of nicotine dependence: Secondary | ICD-10-CM | POA: Diagnosis not present

## 2019-09-05 DIAGNOSIS — S76011A Strain of muscle, fascia and tendon of right hip, initial encounter: Secondary | ICD-10-CM | POA: Diagnosis not present

## 2019-09-05 DIAGNOSIS — S46912A Strain of unspecified muscle, fascia and tendon at shoulder and upper arm level, left arm, initial encounter: Secondary | ICD-10-CM | POA: Insufficient documentation

## 2019-09-05 DIAGNOSIS — Z79899 Other long term (current) drug therapy: Secondary | ICD-10-CM | POA: Diagnosis not present

## 2019-09-05 DIAGNOSIS — Y92513 Shop (commercial) as the place of occurrence of the external cause: Secondary | ICD-10-CM | POA: Insufficient documentation

## 2019-09-05 DIAGNOSIS — I10 Essential (primary) hypertension: Secondary | ICD-10-CM | POA: Insufficient documentation

## 2019-09-05 DIAGNOSIS — Z794 Long term (current) use of insulin: Secondary | ICD-10-CM | POA: Diagnosis not present

## 2019-09-05 DIAGNOSIS — Y939 Activity, unspecified: Secondary | ICD-10-CM | POA: Insufficient documentation

## 2019-09-05 DIAGNOSIS — E119 Type 2 diabetes mellitus without complications: Secondary | ICD-10-CM | POA: Insufficient documentation

## 2019-09-05 DIAGNOSIS — S93402A Sprain of unspecified ligament of left ankle, initial encounter: Secondary | ICD-10-CM | POA: Diagnosis not present

## 2019-09-05 MED ORDER — METHOCARBAMOL 500 MG PO TABS
500.0000 mg | ORAL_TABLET | Freq: Once | ORAL | Status: AC
  Administered 2019-09-05: 500 mg via ORAL
  Filled 2019-09-05: qty 1

## 2019-09-05 MED ORDER — NAPROXEN 500 MG PO TABS: 500.0000 mg | ORAL_TABLET | Freq: Two times a day (BID) | ORAL | 0 refills | Status: DC

## 2019-09-05 MED ORDER — METHOCARBAMOL 500 MG PO TABS: 500.0000 mg | ORAL_TABLET | Freq: Two times a day (BID) | ORAL | 0 refills | Status: DC

## 2019-09-05 MED ORDER — IBUPROFEN 200 MG PO TABS
600.0000 mg | ORAL_TABLET | Freq: Once | ORAL | Status: AC
  Administered 2019-09-05: 600 mg via ORAL
  Filled 2019-09-05: qty 3

## 2019-09-05 NOTE — Discharge Instructions (Addendum)
Take the medications to help with your pain. You will need to use a warm compress and continue stretching massaging to help with your symptoms as well. Return to the ER if you start to experience worsening pain, additional injuries or falls, trouble walking, chest pain or severe headache.

## 2019-09-05 NOTE — ED Provider Notes (Signed)
[facility]-EMERGENCY DEPT Provider Note   CSN: [id number] Arrival date & time: 09/05/19  0753     History Chief Complaint  Patient presents with  . Fall    Pain left arm and ankle and right hip     Sarah Garrison is a 39 y.o. female with a past medical history of diabetes, hypertension presenting to the ED after fall that occurred approximately 24 hours ago.  She was walking in a department store when she believes she may have tripped over something, fell hitting her head on glass.  She landed on her right hip and has been having progressively worsening right hip pain worse with ambulation and palpation since then.  Also reports left shoulder and second digit pain and left ankle pain.  She is not taking the medications to help with her symptoms.  She denies any loss of consciousness, headache, vision changes, numbness in arms or legs, neck pain or back pain, loss of bowel or bladder function, anticoagulant use.  HPI     Past Medical History:  Diagnosis Date  . Asthma   . Diabetes mellitus without complication (HCC)   . GERD (gastroesophageal reflux disease)   . Hypertension     Patient Active Problem List   Diagnosis Date Noted  . OSA (obstructive sleep apnea) 02/16/2019  . 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 Infective otitis externa of left ear 01/01/2019  . Essential hypertension 06/22/2018  . Mixed hyperlipidemia 06/22/2018  . Uncontrolled type 2 diabetes mellitus with complication, with long-term current use of insulin (HCC) 12/13/2016  . Non-adherence to medical treatment 12/13/2016  . Asthma 07/30/2016  . 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/07/2016  . Encounter for pregnancy test 02/01/2016  . Leg swelling 09/29/2015  . Morbid obesity, unspecified obesity type (HCC) 09/29/2015  . DM neuropathy, type II diabetes mellitus (HCC) 08/24/2015    Past Surgical History:  Procedure Laterality Date  . CESAREAN SECTION    . HERNIA REPAIR    . TUBAL LIGATION    .  VENTRAL HERNIA REPAIR N/A 11/27/2017   Procedure: VENTRAL HERNIA REPAIR ERAS PATHWAY;  Surgeon: Harriette Bouillon, MD;  Location: [facility];  Service: General;  Laterality: N/A;  . WISDOM TOOTH EXTRACTION       OB History    Gravida  5   Para  4   Term      Preterm      AB  1   Living  4     SAB  1   TAB      Ectopic      Multiple      Live Births              Family History  Problem Relation Age of Onset  . Asthma Mother   . Kidney failure Mother   . Brain cancer Mother   . Asthma Father   . Other Father        surgery on stomach but don't know from what  . Diabetes Brother   . Diabetes Maternal Grandmother     Social History   Tobacco Use  . Smoking status: Former Smoker    Packs/day: 0.10    Types: Cigarettes    Quit date: 11/27/2017    Years since quitting: 1.7  . Smokeless tobacco: Never Used  Substance Use Topics  . Alcohol use: No  . Drug use: No    Home Medications Prior to Admission medications   Medication Sig Start Date End Date  Taking? Authorizing Provider  atorvastatin (LIPITOR) 20 MG tablet Take 1 tablet (20 mg total) by mouth daily. 11/23/17  Yes Gildardo Pounds, NP  CINNAMON PO Take 1 tablet by mouth 2 (two) times daily.   Yes [provider]  diclofenac (VOLTAREN) 75 MG EC tablet Take 1 tablet (75 mg total) by mouth 2 (two) times daily. Patient taking differently: Take 75 mg by mouth 2 (two) times daily as needed for mild pain.  07/14/19  Yes Stover, Titorya, DPM  insulin aspart (NOVOLOG) 100 UNIT/ML injection Use via insulin pump. Max TDD 200 units 03/25/19  Yes [provider]  lisinopril (ZESTRIL) 20 MG tablet Take 1 tablet (20 mg total) by mouth daily. 12/19/18  Yes Gildardo Pounds, NP  metFORMIN (GLUCOPHAGE-XR) 500 MG 24 hr tablet Take 500 mg by mouth See admin instructions. Takes 500mg  twice daily every 3 days 06/30/19  Yes [provider]  sitaGLIPtin (JANUVIA) 100 MG tablet Take 100 mg  by mouth daily.  11/13/18  Yes [provider]  albuterol (VENTOLIN HFA) 108 (90 Base) MCG/ACT inhaler Inhale 1-2 puffs into the lungs every 6 (six) hours as needed for wheezing or shortness of breath. Patient not taking: Reported on 09/05/2019 01/01/19   Elsie Stain, MD  amoxicillin (AMOXIL) 500 MG capsule Take 1 capsule (500 mg total) by mouth 3 (three) times daily. Patient not taking: Reported on 09/05/2019 06/08/19   Antonietta Breach, PA-C  Cetirizine HCl 10 MG CAPS Take 1 capsule (10 mg total) by mouth daily for 10 days. Patient not taking: Reported on 09/05/2019 06/22/19 07/02/19  Wieters, Hallie C, PA-C  fluticasone (FLONASE) 50 MCG/ACT nasal spray Place 1-2 sprays into both nostrils daily for 7 days. Patient not taking: Reported on 09/05/2019 06/22/19 06/29/19  Wieters, Hallie C, PA-C  medroxyPROGESTERone Acetate (DEPO-PROVERA IM) Inject 150 mLs into the muscle every 3 (three) months.     [provider]  meloxicam (MOBIC) 7.5 MG tablet Take 1 tablet (7.5 mg total) by mouth daily. Patient not taking: Reported on 09/05/2019 01/01/19   Elsie Stain, MD  methocarbamol (ROBAXIN) 500 MG tablet Take 1 tablet (500 mg total) by mouth 2 (two) times daily. 09/05/19   Delia Heady, PA-C  Misc. Devices MISC CPAP therapy on autopap 10-20. Needs Small size Fisher&Paykel Full Face Mask Simplus  mask and heated humidification. 04/28/19   Gildardo Pounds, NP  naproxen (NAPROSYN) 500 MG tablet Take 1 tablet (500 mg total) by mouth 2 (two) times daily. 09/05/19   Hedaya Latendresse, PA-C  omeprazole (PRILOSEC) 40 MG capsule Take 1 capsule (40 mg total) by mouth daily. Patient not taking: Reported on 09/05/2019 06/18/18   Doran Stabler, MD  TRUEPLUS PEN NEEDLES 32G X 4 MM MISC USE 3 TIMES DAILY AS DIRECTED 06/16/18   Charlott Rakes, MD    Allergies    Morphine and related, Glyburide, Ivp dye [iodinated diagnostic agents], Oxycodone, and Vicodin [hydrocodone-acetaminophen]  Review of Systems   Review of  Systems  Constitutional: Negative for chills and fever.  Eyes: Negative for visual disturbance.  Gastrointestinal: Negative for vomiting.  Musculoskeletal: Positive for arthralgias and myalgias.  Neurological: Negative for syncope, weakness, numbness and headaches.    Physical Exam Updated Vital Signs BP (!) 125/94 (BP Location: Right Arm)   Pulse 98   Temp (!) 97.5 F (36.4 C) (Oral)   Resp 20   Ht 5\' 4"  (1.626 m)   Wt 95.3 kg   SpO2 100%  BMI 36.05 kg/m   Physical Exam Vitals and nursing note reviewed.  Constitutional:      General: She is not in acute distress.    Appearance: She is well-developed. She is not diaphoretic.  HENT:     Head: Normocephalic and atraumatic.  Eyes:     General: No scleral icterus.    Conjunctiva/sclera: Conjunctivae normal.  Cardiovascular:     Rate and Rhythm: Normal rate and regular rhythm.     Heart sounds: Normal heart sounds.  Pulmonary:     Effort: Pulmonary effort is normal. No respiratory distress.  Musculoskeletal:        General: Tenderness present.     Cervical back: Normal range of motion.     Comments: TTP of the R hip, L ankle and L shoulder without changes to ROM. No TTP of left hand or digits. No deformities, changes to range of motion or changes to sensation. 2+ radial and DP pulses bilaterally. Ambulatory. No joint swelling or erythema noted. No midline spinal tenderness present in lumbar, thoracic or cervical spine. No step-off palpated. No visible bruising, edema or temperature change noted. No objective signs of numbness present. No saddle anesthesia. Strength 5/5 in bilateral lower extremities.  Skin:    Findings: No rash.  Neurological:     Mental Status: She is alert.     ED Results / Procedures / Treatments   Labs (all labs ordered are listed, but only abnormal results are displayed) Labs Reviewed - No data to display  EKG None  Radiology DG Ankle Complete Left  Result Date: 09/05/2019 CLINICAL DATA:   Pain following fall EXAM: LEFT ANKLE COMPLETE - 3+ VIEW COMPARISON:  None. FINDINGS: Frontal, oblique, and lateral views were obtained. There is no evident fracture or joint effusion. There is no joint space narrowing or erosion. There are small posterior and inferior calcaneal spurs. Ankle mortise appears intact. IMPRESSION: No fracture or appreciable joint space narrowing. There are calcaneal spurs. Ankle mortise appears intact. Electronically Signed   By: Bretta Bang III M.D.   On: 09/05/2019 09:19   DG Shoulder Left  Result Date: 09/05/2019 CLINICAL DATA:  Pain following fall EXAM: LEFT SHOULDER - 2+ VIEW COMPARISON:  December 14, 2015 FINDINGS: Oblique, Y scapular, and axillary images obtained. There is no fracture or dislocation. The joint spaces appear normal. A small apparent accessory ossicle is noted along the inferior acromion which may impress upon the rotator cuff tendons in this area. No erosive change. Visualized left lung clear. IMPRESSION: No fracture or dislocation. No appreciable arthropathy. Apparent accessory ossicle along the inferior acromion which potentially may impress upon rotator cuff tendons. This appearance is stable compared to the previous study. Electronically Signed   By: Bretta Bang III M.D.   On: 09/05/2019 09:17   DG Hand Complete Left  Result Date: 09/05/2019 CLINICAL DATA:  Pain following fall EXAM: LEFT HAND - COMPLETE 3+ VIEW COMPARISON:  None. FINDINGS: Frontal, oblique, and lateral views were obtained. There is no fracture or dislocation. Joint spaces appear normal. No erosive change. IMPRESSION: No fracture or dislocation.  No evident arthropathy. Electronically Signed   By: Bretta Bang III M.D.   On: 09/05/2019 09:20   DG Hip Unilat W or Wo Pelvis 2-3 Views Right  Result Date: 09/05/2019 CLINICAL DATA:  Pain following fall EXAM: DG HIP (WITH OR WITHOUT PELVIS) 2-3V RIGHT COMPARISON:  None. FINDINGS: Frontal pelvis as well as frontal and lateral left  hip images were obtained. No fracture or  dislocation. Joint spaces appear normal. No erosive change. There is symmetric bony overgrowth along each superolateral acetabulum. Sacroiliac joints appear normal bilaterally. IMPRESSION: Symmetric bony overgrowth along each superolateral acetabulum. This is a finding that potentially may place patient at increased risk for femoroacetabular impingement. No appreciable joint space narrowing or erosion. No fracture or dislocation. Electronically Signed   By: Bretta Bang III M.D.   On: 09/05/2019 09:18    Procedures Procedures (including critical care time)  Medications Ordered in ED Medications  ibuprofen (ADVIL) tablet 600 mg (has no administration in time range)  methocarbamol (ROBAXIN) tablet 500 mg (has no administration in time range)    ED Course  I have reviewed the triage vital signs and the nursing notes.  Pertinent labs & imaging results that were available during my care of the patient were reviewed by me and considered in my medical decision making (see chart for details).    MDM Rules/Calculators/A&P                      39 year old female with a past medical history of diabetes and hypertension presents to the ED for fall that occurred 24 hours ago.  Believes she may have tripped over something yesterday when she walked in a department store.  She hit her head on glass but denies any loss of consciousness, headache or vision changes or anticoagulant use.  Reports left shoulder pain, left second digit pain and hand, left ankle pain and right hip pain.  She has remained ambulatory.  On exam there are no changes to range of motion, no deformities and no changes to sensation.  Area is neurovascularly intact without wounds.  She appears overall well.  No C, T or L-spine tenderness palpation.  Plain films of the left hand, left ankle, left shoulder and right hip are negative for acute abnormality.  Suspect that her symptoms could be due to  contusion.  No signs of severe head injury or any deficits neurological exam.  She is hemodynamically stable.  Pain and symptoms are controlled here.  We will have her follow-up with PCP, continue symptomatic treatment and return for worsening symptoms.  Patient is hemodynamically stable, in NAD, and able to ambulate in the ED. Evaluation does not show pathology that would require ongoing emergent intervention or inpatient treatment. I have personally reviewed and interpreted all lab work and imaging at today's ED visit. I explained the diagnosis to the patient. Pain has been managed and has no complaints prior to discharge. Patient is comfortable with above plan and is stable for discharge at this time. All questions were answered prior to disposition. Strict return precautions for returning to the ED were discussed. Encouraged follow up with PCP.   An After Visit Summary was printed and given to the patient.   Portions of this note were generated with Scientist, clinical (histocompatibility and immunogenetics). Dictation errors may occur despite best attempts at proofreading.  Final Clinical Impression(s) / ED Diagnoses Final diagnoses:  Fall, initial encounter  Strain of left shoulder, initial encounter  Strain of right hip, initial encounter  Sprain of left ankle, unspecified ligament, initial encounter    Rx / DC Orders ED Discharge Orders         Ordered    methocarbamol (ROBAXIN) 500 MG tablet  2 times daily     09/05/19 0946    naproxen (NAPROSYN) 500 MG tablet  2 times daily     09/05/19 0947  Dietrich PatesKhatri, Aubryana Vittorio, PA-C 09/05/19 [id number]    Gwyneth SproutPlunkett, Whitney, MD 09/05/19 1454

## 2019-09-05 NOTE — ED Triage Notes (Addendum)
Patient fell around 10:00 am yesterday while entering a  department store. Patient states when she fell she hit head on window with no LOC .Patient presents with pain left arm and  index finger as well as left ankle. Right hip pain noted as well pain rating 7/10

## 2019-09-07 ENCOUNTER — Other Ambulatory Visit: Payer: Self-pay

## 2019-09-07 ENCOUNTER — Ambulatory Visit: Payer: Medicaid Other | Attending: Family Medicine

## 2019-09-07 DIAGNOSIS — Z9989 Dependence on other enabling machines and devices: Secondary | ICD-10-CM

## 2019-09-07 DIAGNOSIS — Z3042 Encounter for surveillance of injectable contraceptive: Secondary | ICD-10-CM | POA: Diagnosis not present

## 2019-09-07 DIAGNOSIS — G4733 Obstructive sleep apnea (adult) (pediatric): Secondary | ICD-10-CM

## 2019-09-07 MED ORDER — MEDROXYPROGESTERONE ACETATE 150 MG/ML IM SUSP
150.0000 mg | Freq: Once | INTRAMUSCULAR | Status: AC
  Administered 2019-09-07: 14:00:00 150 mg via INTRAMUSCULAR

## 2019-09-07 MED FILL — NAPROXEN 500 MG TABLET: 500 | 15 days supply | Qty: 30 | Fill #0

## 2019-09-07 MED FILL — METHOCARBAMOL 500 MG TABS: 500 | 10 days supply | Qty: 20 | Fill #0

## 2019-09-07 NOTE — Progress Notes (Signed)
Last Dep-Provera:06/22/2019 Depe-Provera was given in right ventrogluteal , patient tolerated injection well. Next Depo shot: June 21- July 5 

## 2019-09-07 NOTE — Procedures (Signed)
Patient Name: Sarah Garrison, [place] Date: 08/28/2019 Gender: Female D.O.B: Nov 11, 1980 Age (years): 38 Referring Provider: Claiborne Rigg NP Height (inches): 64 Interpreting Physician: Jetty Duhamel MD, ABSM Weight (lbs): 218 RPSGT: Armen Pickup BMI: 37 MRN: [id number] Neck Size: 15.50  CLINICAL INFORMATION The patient is referred for a CPAP titration to treat sleep apnea.  Date of NPSG, Split Night or HST:  NPSG 02/16/2019   AHI 21.7/ hr  desaturation to 88%, body weight 241 lbs  SLEEP STUDY TECHNIQUE As per the AASM Manual for the Scoring of Sleep and Associated Events v2.3 (April 2016) with a hypopnea requiring 4% desaturations.  The channels recorded and monitored were frontal, central and occipital EEG, electrooculogram (EOG), submentalis EMG (chin), nasal and oral airflow, thoracic and abdominal wall motion, anterior tibialis EMG, snore microphone, electrocardiogram, and pulse oximetry. Continuous positive airway pressure (CPAP) was initiated at the beginning of the study and titrated to treat sleep-disordered breathing.  MEDICATIONS Medications self-administered by patient taken the night of the study : NOVALOG, TUMS E-X, ADVIL PM  TECHNICIAN COMMENTS Comments added by technician: no restroom visted. Patient had difficulty initiating sleep. Comments added by scorer: N/A RESPIRATORY PARAMETERS Optimal PAP Pressure (cm): 8 AHI at Optimal Pressure (/hr): 0.8 Overall Minimal O2 (%): 87.0 Supine % at Optimal Pressure (%): 100 Minimal O2 at Optimal Pressure (%): 93.0   SLEEP ARCHITECTURE The study was initiated at 10:32:45 PM and ended at 4:26:59 AM.  Sleep onset time was 20.7 minutes and the sleep efficiency was 81.3%%. The total sleep time was 288 minutes.  The patient spent 5.9%% of the night in stage N1 sleep, 60.8%% in stage N2 sleep, 9.9%% in stage N3 and 23.4% in REM.Stage REM latency was 127.0 minutes  Wake after sleep onset was 45.5. Alpha intrusion was  absent. Supine sleep was 100.00%.  CARDIAC DATA The 2 lead EKG demonstrated sinus rhythm. The mean heart rate was 92.7 beats per minute. Other EKG findings include: None.  LEG MOVEMENT DATA The total Periodic Limb Movements of Sleep (PLMS) were 0. The PLMS index was 0.0. A PLMS index of <15 is considered normal in adults.  IMPRESSIONS - The optimal PAP pressure was 8 cm of water. - Central sleep apnea was not noted during this titration (CAI = 0.0/h). - Mild oxygen desaturations were observed during this titration (min O2 = 87.0%). Min sat on CPAP 8 was 93%. - No snoring was audible during this study. - No cardiac abnormalities were observed during this study. - Clinically significant periodic limb movements were not noted during this study. Arousals associated with PLMs were rare.  DIAGNOSIS - Obstructive Sleep Apnea (327.23 [G47.33 ICD-10])  RECOMMENDATIONS - Trial of CPAP therapy on 8 cm H2O or autopap 5-15. - Patient used a Small size Fisher&Paykel Full Face Mask Simplus mask and heated humidification. - Be careful with alcohol, sedatives and other CNS depressants that may worsen sleep apnea and disrupt normal sleep architecture. - Sleep hygiene should be reviewed to assess factors that may improve sleep quality. - Weight management and regular exercise should be initiated or continued.  [Electronically signed] 09/07/2019 01:12 PM  Jetty Duhamel MD, ABSM Diplomate, American Board of Sleep Medicine   NPI: [id number]                         Jetty Duhamel Diplomate, American Board of Sleep Medicine  ELECTRONICALLY SIGNED ON:  09/07/2019, 1:08 PM [facility] PH: [phone]  FX: [phone] [place]

## 2019-09-09 ENCOUNTER — Encounter: Payer: Medicaid Other | Admitting: Podiatry

## 2019-09-13 ENCOUNTER — Other Ambulatory Visit: Payer: Self-pay | Admitting: Nurse Practitioner

## 2019-09-13 MED ORDER — MISC. DEVICES MISC: 0 refills | Status: DC

## 2019-09-15 ENCOUNTER — Other Ambulatory Visit: Payer: Self-pay

## 2019-09-15 ENCOUNTER — Encounter: Payer: Self-pay | Admitting: Sports Medicine

## 2019-09-15 ENCOUNTER — Encounter: Payer: Medicaid Other | Admitting: Sports Medicine

## 2019-09-15 ENCOUNTER — Ambulatory Visit: Payer: Medicaid Other | Admitting: Sports Medicine

## 2019-09-15 VITALS — Temp 97.5°F

## 2019-09-15 DIAGNOSIS — M722 Plantar fascial fibromatosis: Secondary | ICD-10-CM | POA: Diagnosis not present

## 2019-09-15 DIAGNOSIS — E1165 Type 2 diabetes mellitus with hyperglycemia: Secondary | ICD-10-CM

## 2019-09-15 DIAGNOSIS — M79671 Pain in right foot: Secondary | ICD-10-CM

## 2019-09-15 DIAGNOSIS — M79672 Pain in left foot: Secondary | ICD-10-CM

## 2019-09-15 DIAGNOSIS — M729 Fibroblastic disorder, unspecified: Secondary | ICD-10-CM

## 2019-09-15 DIAGNOSIS — IMO0002 Reserved for concepts with insufficient information to code with codable children: Secondary | ICD-10-CM

## 2019-09-15 MED ORDER — MELOXICAM 15 MG PO TABS: 15.0000 mg | ORAL_TABLET | Freq: Every day | ORAL | 0 refills | Status: DC

## 2019-09-15 MED FILL — MELOXICAM 15 MG TABLET: 15 | 30 days supply | Qty: 30 | Fill #0

## 2019-09-15 NOTE — Progress Notes (Signed)
Subjective: Sarah Garrison is a 39 y.o. female returns to office for follow up evaluation of right heel pain, reports that she is now starting to get pain a little bit on the left and reports that the pain is really bad however she does not want injections.  Patient reports that she has to see her PCP to get her A1c down in order to have surgery however she is wanting to know at this time if there are options of additional arch support that we can try to help with her symptoms.  Patient denies any recent changes in medications or new problems since last visit.   Sugar this morning 196  Last A1c 11  Patient Active Problem List   Diagnosis Date Noted  . OSA (obstructive sleep apnea) 02/16/2019  . 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 Infective otitis externa of left ear 01/01/2019  . Essential hypertension 06/22/2018  . Mixed hyperlipidemia 06/22/2018  . Uncontrolled type 2 diabetes mellitus with complication, with long-term current use of insulin ([place]) 12/13/2016  . Non-adherence to medical treatment 12/13/2016  . Asthma 07/30/2016  . 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/07/2016  . Encounter for pregnancy test 02/01/2016  . Leg swelling 09/29/2015  . Morbid obesity, unspecified obesity type ([place]) 09/29/2015  . DM neuropathy, type II diabetes mellitus ([place]) 08/24/2015    Current Outpatient Medications on File Prior to Visit  Medication Sig Dispense Refill  . albuterol (VENTOLIN HFA) 108 (90 Base) MCG/ACT inhaler Inhale 1-2 puffs into the lungs every 6 (six) hours as needed for wheezing or shortness of breath. 18 g 1  . atorvastatin (LIPITOR) 20 MG tablet Take 1 tablet (20 mg total) by mouth daily. 90 tablet 3  . CINNAMON PO Take 1 tablet by mouth 2 (two) times daily.    . diclofenac (VOLTAREN) 75 MG EC tablet Take 1 tablet (75 mg total) by mouth 2 (two) times daily. (Patient taking differently: Take 75 mg by mouth 2 (two) times daily as needed for mild pain. ) 60 tablet 0  . insulin aspart (NOVOLOG) 100  UNIT/ML injection Use via insulin pump. Max TDD 200 units    . lisinopril (ZESTRIL) 20 MG tablet Take 1 tablet (20 mg total) by mouth daily. 90 tablet 3  . medroxyPROGESTERone Acetate (DEPO-PROVERA IM) Inject 150 mLs into the muscle every 3 (three) months.     . metFORMIN (GLUCOPHAGE-XR) 500 MG 24 hr tablet Take 500 mg by mouth See admin instructions. Takes 500mg  twice daily every 3 days    . methocarbamol (ROBAXIN) 500 MG tablet Take 1 tablet (500 mg total) by mouth 2 (two) times daily. 20 tablet 0  . Misc. Devices MISC CPAP therapy on autopap 5-15.  Needs Small size [employer]kel Full Face Mask Simplus  mask and heated humidification. 1 each 0  . naproxen (NAPROSYN) 500 MG tablet Take 1 tablet (500 mg total) by mouth 2 (two) times daily. 30 tablet 0  . omeprazole (PRILOSEC) 40 MG capsule Take 1 capsule (40 mg total) by mouth daily. 90 capsule 3  . sitaGLIPtin (JANUVIA) 100 MG tablet Take 100 mg by mouth daily.     . TRUEPLUS PEN NEEDLES 32G X 4 MM MISC USE 3 TIMES DAILY AS DIRECTED 100 each 5  . Cetirizine HCl 10 MG CAPS Take 1 capsule (10 mg total) by mouth daily for 10 days. (Patient not taking: Reported on 09/05/2019) 10 capsule 0  . fluticasone (FLONASE) 50 MCG/ACT nasal spray Place 1-2 sprays into both nostrils daily for 7 days. (  Patient not taking: Reported on 09/05/2019) 1 g 0   No current facility-administered medications on file prior to visit.    Allergies  Allergen Reactions  . Morphine And Related Shortness Of Breath  . Glyburide Diarrhea and Other (See Comments)    Reaction:  Nose bleeds   . Ivp Dye [Iodinated Diagnostic Agents] Other (See Comments)    Shortness of breath.    . Oxycodone Other (See Comments)    Pt states that this medication makes her "dream about rabbits chasing" her.    . Vicodin [Hydrocodone-Acetaminophen] Other (See Comments)    Pt states that this medication makes her "dream about rabbits chasing" her.     Family History  Problem Relation Age of Onset   . Asthma Mother   . Kidney failure Mother   . Brain cancer Mother   . Asthma Father   . Other Father        surgery on stomach but don't know from what  . Diabetes Brother   . Diabetes Maternal Grandmother     Past Surgical History:  Procedure Laterality Date  . CESAREAN SECTION    . HERNIA REPAIR    . TUBAL LIGATION    . VENTRAL HERNIA REPAIR N/A 11/27/2017   Procedure: VENTRAL HERNIA REPAIR ERAS PATHWAY;  Surgeon: Harriette Bouillon, MD;  Location: [facility];  Service: General;  Laterality: N/A;  . WISDOM TOOTH EXTRACTION      Social History   Socioeconomic History  . Marital status: Single    Spouse name: Not on file  . Number of children: 4  . Years of education: Not on file  . Highest education level: Not on file  Occupational History  . Not on file  Tobacco Use  . Smoking status: Former Smoker    Packs/day: 0.10    Types: Cigarettes    Quit date: 11/27/2017    Years since quitting: 1.8  . Smokeless tobacco: Never Used  Substance and Sexual Activity  . Alcohol use: No  . Drug use: No  . Sexual activity: Yes    Birth control/protection: Surgical  Other Topics Concern  . Not on file  Social History Narrative  . Not on file   Social Determinants of Health   Financial Resource Strain:   . Difficulty of Paying Living Expenses:   Food Insecurity:   . Worried About Programme researcher, broadcasting/film/video in the Last Year:   . Barista in the Last Year:   Transportation Needs:   . Freight forwarder (Medical):   Marland Kitchen Lack of Transportation (Non-Medical):   Physical Activity:   . Days of Exercise per Week:   . Minutes of Exercise per Session:   Stress:   . Feeling of Stress :   Social Connections:   . Frequency of Communication with Friends and Family:   . Frequency of Social Gatherings with Friends and Family:   . Attends Religious Services:   . Active Member of Clubs or Organizations:   . Attends Banker Meetings:   Marland Kitchen Marital Status:      Objective:   General:  Alert and oriented x 3, in no acute distress  Dermatology: Skin is warm, dry, and supple bilateral. Nails are within normal limits. There is no lower extremity erythema, no eccymosis, no open lesions present bilateral.   Vascular: Dorsalis Pedis and Posterior Tibial pedal pulses are 2/4 bilateral. + hair growth noted bilateral. Capillary Fill Time is 3 seconds in all  digits. No varicosities, No edema bilateral lower extremities.   Neurological: Sensation grossly intact to light touch with an achilles reflex of +2 and a  negative Tinel's sign bilateral. Vibratory, sharp/dull, Semmes Weinstein Monofilament within normal limits.   Musculoskeletal: There is tenderness to palpation at the medial calcaneal tubercale and through the insertion of the plantar fascia on the right greater than left foot. No pain with compression to calcaneus or application of tuning fork. There is decreased Ankle joint range of motion bilateral. All other jointsrange of motion  within normal limits bilateral. +Pes planus. Strength 5/5 bilateral.   Assessment and Plan: Problem List Items Addressed This Visit      Endocrine   Uncontrolled type 2 diabetes mellitus with complication, with long-term current use of insulin ([facility])    Other Visit Diagnoses    Fasciitis    -  Primary   Right foot pain       Left foot pain           -Complete examination performed.  -Re-Discussed with patient in detail the condition of plantar fasciitis, how this  occurs related to the foot type of the patient and general treatment options. - Patient stills refuses injection at this time -Applied plantar fascial strapping to both feet and advised patient if this works well may benefit from getting over-the-counter air plus or super feet orthotics -Advised patient to continue with stretching icing and taking meloxicam as I prescribed this visit -Advised patient to work on getting her A1c down and once this goal  has been achieved with the A1c is at least 8 we may think about rescheduling her for EPF on the right however at this time we will hold off until patient A1c has improved  -Patient to return to office as needed or questions arise.  Asencion Islam, DPM

## 2019-09-15 NOTE — Patient Instructions (Addendum)
Get Airplus insoles from [employer]  Superfeet from [employer] or [employer]  STRAPPING INSTRUCTIONS  Strapping's need to be worn for 5 days for maximum benefit.  If you next appointment is before 5 days, please remove prior to coming in.  Try to avoid putting lotion on feet during the taping process.  The tape will not stick as well and will not be as effective.  If you were given stretching exercises, start these after removal of the tape.  These exercises will actually loosen the tape and you may not receive full benefit of the strapping.  It is important that you wear sturdy shoes at all times when walking.  Bedroom shoes, slippers, flip flops, etc. are not acceptable.  **If at any time while wearing the strapping you should notice any irritation such as a rash, redness, or itching, remove the tape and wash your foot/feet thoroughly.  BATHING INSTRUCTIONS  Take a washcloth, fold it a few times and tape it around your ankle.   Then take a garbage bag, place it over your foot and angle and tape it above and below the washcloth.  TAKE A QUICK SHOWER.  The washcloth should absorb any water that runs down into the garbage bag.  If your foot gets wet, take a towel and absorb as much of the water as possible. You can also take a blow dryer, put it on low heat, and dry your bandage.   Diabetes Mellitus and Foot Care Foot care is an important part of your health, especially when you have diabetes. Diabetes may cause you to have problems because of poor blood flow (circulation) to your feet and legs, which can cause your skin to:  Become thinner and drier.  Break more easily.  Heal more slowly.  Peel and crack. You may also have nerve damage (neuropathy) in your legs and feet, causing decreased feeling in them. This means that you may not notice minor injuries to your feet that could lead to more serious problems. Noticing and addressing any potential problems early is the best way to prevent  future foot problems. How to care for your feet Foot hygiene  Wash your feet daily with warm water and mild soap. Do not use hot water. Then, pat your feet and the areas between your toes until they are completely dry. Do not soak your feet as this can dry your skin.  Trim your toenails straight across. Do not dig under them or around the cuticle. File the edges of your nails with an emery board or nail file.  Apply a moisturizing lotion or petroleum jelly to the skin on your feet and to dry, brittle toenails. Use lotion that does not contain alcohol and is unscented. Do not apply lotion between your toes. Shoes and socks  Wear clean socks or stockings every day. Make sure they are not too tight. Do not wear knee-high stockings since they may decrease blood flow to your legs.  Wear shoes that fit properly and have enough cushioning. Always look in your shoes before you put them on to be sure there are no objects inside.  To break in new shoes, wear them for just a few hours a day. This prevents injuries on your feet. Wounds, scrapes, corns, and calluses  Check your feet daily for blisters, cuts, bruises, sores, and redness. If you cannot see the bottom of your feet, use a mirror or ask someone for help.  Do not cut corns or calluses or try  to remove them with medicine.  If you find a minor scrape, cut, or break in the skin on your feet, keep it and the skin around it clean and dry. You may clean these areas with mild soap and water. Do not clean the area with peroxide, alcohol, or iodine.  If you have a wound, scrape, corn, or callus on your foot, look at it several times a day to make sure it is healing and not infected. Check for: ? Redness, swelling, or pain. ? Fluid or blood. ? Warmth. ? Pus or a bad smell. General instructions  Do not cross your legs. This may decrease blood flow to your feet.  Do not use heating pads or hot water bottles on your feet. They may burn your skin.  If you have lost feeling in your feet or legs, you may not know this is happening until it is too late.  Protect your feet from hot and cold by wearing shoes, such as at the beach or on hot pavement.  Schedule a complete foot exam at least once a year (annually) or more often if you have foot problems. If you have foot problems, report any cuts, sores, or bruises to your health care provider immediately. Contact a health care provider if:  You have a medical condition that increases your risk of infection and you have any cuts, sores, or bruises on your feet.  You have an injury that is not healing.  You have redness on your legs or feet.  You feel burning or tingling in your legs or feet.  You have pain or cramps in your legs and feet.  Your legs or feet are numb.  Your feet always feel cold.  You have pain around a toenail. Get help right away if:  You have a wound, scrape, corn, or callus on your foot and: ? You have pain, swelling, or redness that gets worse. ? You have fluid or blood coming from the wound, scrape, corn, or callus. ? Your wound, scrape, corn, or callus feels warm to the touch. ? You have pus or a bad smell coming from the wound, scrape, corn, or callus. ? You have a fever. ? You have a red line going up your leg. Summary  Check your feet every day for cuts, sores, red spots, swelling, and blisters.  Moisturize feet and legs daily.  Wear shoes that fit properly and have enough cushioning.  If you have foot problems, report any cuts, sores, or bruises to your health care provider immediately.  Schedule a complete foot exam at least once a year (annually) or more often if you have foot problems. This information is not intended to replace advice given to you by your health care provider. Make sure you discuss any questions you have with your health care provider. Document Revised: 02/11/2019 Document Reviewed: 06/22/2016 Elsevier Patient Education  [place].

## 2019-09-16 ENCOUNTER — Telehealth: Payer: Self-pay | Admitting: Sports Medicine

## 2019-09-16 ENCOUNTER — Emergency Department (HOSPITAL_COMMUNITY)
Admission: EM | Admit: 2019-09-16 | Discharge: 2019-09-17 | Disposition: A | Discharge status: Home / Self Care | Payer: Medicaid Other | Attending: Emergency Medicine | Admitting: Emergency Medicine

## 2019-09-16 ENCOUNTER — Encounter (HOSPITAL_COMMUNITY): Payer: Self-pay | Admitting: Emergency Medicine

## 2019-09-16 ENCOUNTER — Other Ambulatory Visit: Payer: Self-pay

## 2019-09-16 ENCOUNTER — Emergency Department (HOSPITAL_COMMUNITY): Payer: Medicaid Other

## 2019-09-16 DIAGNOSIS — Y939 Activity, unspecified: Secondary | ICD-10-CM | POA: Insufficient documentation

## 2019-09-16 DIAGNOSIS — I10 Essential (primary) hypertension: Secondary | ICD-10-CM | POA: Diagnosis not present

## 2019-09-16 DIAGNOSIS — S40212A Abrasion of left shoulder, initial encounter: Secondary | ICD-10-CM | POA: Insufficient documentation

## 2019-09-16 DIAGNOSIS — S0990XA Unspecified injury of head, initial encounter: Secondary | ICD-10-CM | POA: Diagnosis present

## 2019-09-16 DIAGNOSIS — E119 Type 2 diabetes mellitus without complications: Secondary | ICD-10-CM | POA: Insufficient documentation

## 2019-09-16 DIAGNOSIS — R0789 Other chest pain: Secondary | ICD-10-CM | POA: Diagnosis not present

## 2019-09-16 DIAGNOSIS — Z79899 Other long term (current) drug therapy: Secondary | ICD-10-CM | POA: Insufficient documentation

## 2019-09-16 DIAGNOSIS — Y92410 Unspecified street and highway as the place of occurrence of the external cause: Secondary | ICD-10-CM | POA: Diagnosis not present

## 2019-09-16 DIAGNOSIS — M25512 Pain in left shoulder: Secondary | ICD-10-CM | POA: Diagnosis not present

## 2019-09-16 DIAGNOSIS — M542 Cervicalgia: Secondary | ICD-10-CM | POA: Insufficient documentation

## 2019-09-16 DIAGNOSIS — Z794 Long term (current) use of insulin: Secondary | ICD-10-CM | POA: Insufficient documentation

## 2019-09-16 DIAGNOSIS — M25531 Pain in right wrist: Secondary | ICD-10-CM | POA: Insufficient documentation

## 2019-09-16 DIAGNOSIS — R109 Unspecified abdominal pain: Secondary | ICD-10-CM | POA: Diagnosis not present

## 2019-09-16 DIAGNOSIS — Y999 Unspecified external cause status: Secondary | ICD-10-CM | POA: Insufficient documentation

## 2019-09-16 DIAGNOSIS — Z87891 Personal history of nicotine dependence: Secondary | ICD-10-CM | POA: Diagnosis not present

## 2019-09-16 LAB — COMPREHENSIVE METABOLIC PANEL
ALT: 21 U/L (ref 0–44)
AST: 24 U/L (ref 15–41)
Albumin: 3.8 g/dL (ref 3.5–5.0)
Alkaline Phosphatase: 80 U/L (ref 38–126)
Anion gap: 15 (ref 5–15)
BUN: 15 mg/dL (ref 6–20)
CO2: 18 mmol/L — ABNORMAL LOW (ref 22–32)
Calcium: 9.2 mg/dL (ref 8.9–10.3)
Chloride: 104 mmol/L (ref 98–111)
Creatinine, Ser: 0.76 mg/dL (ref 0.44–1.00)
GFR calc Af Amer: >60 mL/min (ref >60)
GFR calc non Af Amer: >60 mL/min (ref >60)
Glucose, Bld: 304 mg/dL — ABNORMAL HIGH (ref 70–99)
Potassium: 4.7 mmol/L (ref 3.5–5.1)
Sodium: 137 mmol/L (ref 135–145)
Total Bilirubin: 1.1 mg/dL (ref 0.3–1.2)
Total Protein: 7 g/dL (ref 6.5–8.1)

## 2019-09-16 LAB — CBC WITH DIFFERENTIAL/PLATELET
Abs Immature Granulocytes: 0.04 10*3/uL (ref 0.00–0.07)
Basophils Absolute: 0.1 10*3/uL (ref 0.0–0.1)
Basophils Relative: 1 %
Eosinophils Absolute: 0 10*3/uL (ref 0.0–0.5)
Eosinophils Relative: 0 %
HCT: 45 % (ref 36.0–46.0)
Hemoglobin: 15.2 g/dL — ABNORMAL HIGH (ref 12.0–15.0)
Immature Granulocytes: 1 %
Lymphocytes Relative: 41 %
Lymphs Abs: 2.8 10*3/uL (ref 0.7–4.0)
MCH: 29.7 pg (ref 26.0–34.0)
MCHC: 33.8 g/dL (ref 30.0–36.0)
MCV: 88.1 fL (ref 80.0–100.0)
Monocytes Absolute: 0.4 10*3/uL (ref 0.1–1.0)
Monocytes Relative: 6 %
Neutro Abs: 3.5 10*3/uL (ref 1.7–7.7)
Neutrophils Relative %: 51 %
Platelets: 317 10*3/uL (ref 150–400)
RBC: 5.11 MIL/uL (ref 3.87–5.11)
RDW: 11.9 % (ref 11.5–15.5)
WBC: 6.9 10*3/uL (ref 4.0–10.5)
nRBC: 0 % (ref 0.0–0.2)

## 2019-09-16 LAB — I-STAT BETA HCG BLOOD, ED (MC, WL, AP ONLY): I-stat hCG, quantitative: <5 m[IU]/mL (ref <5)

## 2019-09-16 MED ORDER — IOHEXOL 9 MG/ML PO SOLN
ORAL | Status: AC
  Filled 2019-09-16: qty 1000

## 2019-09-16 MED ORDER — METHOCARBAMOL 500 MG PO TABS: 500.0000 mg | ORAL_TABLET | Freq: Two times a day (BID) | ORAL | 0 refills | Status: DC

## 2019-09-16 MED ORDER — BARIUM SULFATE 2.1 % PO SUSP
ORAL | Status: AC
  Filled 2019-09-16: qty 2

## 2019-09-16 MED ORDER — FENTANYL CITRATE (PF) 100 MCG/2ML IJ SOLN
50.0000 ug | Freq: Once | INTRAMUSCULAR | Status: AC
  Administered 2019-09-16: 50 ug via INTRAVENOUS
  Filled 2019-09-16: qty 2

## 2019-09-16 MED ORDER — LORAZEPAM 2 MG/ML IJ SOLN
1.0000 mg | Freq: Once | INTRAMUSCULAR | Status: AC
  Administered 2019-09-17: 1 mg via INTRAVENOUS
  Filled 2019-09-16: qty 1

## 2019-09-16 MED ORDER — NAPROXEN 500 MG PO TABS: 500.0000 mg | ORAL_TABLET | Freq: Two times a day (BID) | ORAL | 0 refills | Status: DC

## 2019-09-16 MED ORDER — FENTANYL CITRATE (PF) 100 MCG/2ML IJ SOLN
50.0000 ug | Freq: Once | INTRAMUSCULAR | Status: AC
  Administered 2019-09-17: 50 ug via INTRAVENOUS
  Filled 2019-09-16: qty 2

## 2019-09-16 NOTE — Telephone Encounter (Signed)
Left message informing pt the letter had been typed and she could pick it up at the front reception desk.

## 2019-09-16 NOTE — Telephone Encounter (Signed)
Left message for pt to call to discuss the letter she requested.

## 2019-09-16 NOTE — Telephone Encounter (Signed)
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 Can you contact patient to see if she wants to pick up note or if she wants it faxed to her job stating that taping was applied in office on 4/13 and patient must keep in place until 4/18. No other work limitation or restrictions at this time. -Dr. Kathie Rhodes

## 2019-09-16 NOTE — Telephone Encounter (Signed)
Pt called to say that she needs a dr note for the  5days that she will be wearing her foot taped from appt  04/138/21 please advise
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
## 2019-09-17 ENCOUNTER — Ambulatory Visit: Payer: Medicaid Other | Admitting: Nurse Practitioner

## 2019-09-17 ENCOUNTER — Emergency Department (HOSPITAL_COMMUNITY): Payer: Medicaid Other

## 2019-09-17 MED ORDER — ACETAMINOPHEN 500 MG PO TABS: 500.0000 mg | ORAL_TABLET | Freq: Four times a day (QID) | ORAL | 0 refills | Status: DC | PRN

## 2019-09-17 NOTE — ED Notes (Signed)
Pt given  Pain med and sedation wiil watch for 30 minutes

## 2019-09-17 NOTE — ED Notes (Signed)
The pt is hurting all over

## 2019-09-18 ENCOUNTER — Emergency Department (HOSPITAL_COMMUNITY): Payer: Medicaid Other

## 2019-09-18 ENCOUNTER — Encounter (HOSPITAL_COMMUNITY): Payer: Self-pay

## 2019-09-18 ENCOUNTER — Emergency Department (HOSPITAL_COMMUNITY)
Admission: EM | Admit: 2019-09-18 | Discharge: 2019-09-18 | Disposition: A | Discharge status: Home / Self Care | Payer: Medicaid Other | Attending: Emergency Medicine | Admitting: Emergency Medicine

## 2019-09-18 ENCOUNTER — Other Ambulatory Visit: Payer: Self-pay

## 2019-09-18 DIAGNOSIS — E119 Type 2 diabetes mellitus without complications: Secondary | ICD-10-CM | POA: Diagnosis not present

## 2019-09-18 DIAGNOSIS — M79642 Pain in left hand: Secondary | ICD-10-CM | POA: Diagnosis present

## 2019-09-18 DIAGNOSIS — Z794 Long term (current) use of insulin: Secondary | ICD-10-CM | POA: Insufficient documentation

## 2019-09-18 MED ORDER — TRAMADOL HCL 50 MG PO TABS: 50.0000 mg | ORAL_TABLET | Freq: Four times a day (QID) | ORAL | 0 refills | Status: DC | PRN

## 2019-09-18 MED FILL — traMADol HCL 50 MG TABS: 50 | 2 days supply | Qty: 10 | Fill #0
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
## 2019-09-18 NOTE — Discharge Instructions (Signed)

## 2019-09-19 ENCOUNTER — Other Ambulatory Visit: Payer: Self-pay

## 2019-09-19 ENCOUNTER — Ambulatory Visit (HOSPITAL_COMMUNITY)
Admission: EM | Admit: 2019-09-19 | Discharge: 2019-09-19 | Disposition: A | Discharge status: Home / Self Care | Payer: Medicaid Other

## 2019-09-19 ENCOUNTER — Encounter (HOSPITAL_COMMUNITY): Payer: Self-pay

## 2019-09-19 DIAGNOSIS — M7918 Myalgia, other site: Secondary | ICD-10-CM
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
## 2019-09-29 ENCOUNTER — Encounter: Payer: Medicaid Other | Admitting: Sports Medicine

## 2019-09-29 ENCOUNTER — Emergency Department (HOSPITAL_COMMUNITY)
Admission: EM | Admit: 2019-09-29 | Discharge: 2019-09-29 | Disposition: A | Discharge status: Home / Self Care | Payer: Medicaid Other | Attending: Emergency Medicine | Admitting: Emergency Medicine

## 2019-09-29 ENCOUNTER — Telehealth: Payer: Self-pay | Admitting: *Deleted

## 2019-09-29 ENCOUNTER — Emergency Department (HOSPITAL_COMMUNITY): Payer: Medicaid Other

## 2019-09-29 ENCOUNTER — Other Ambulatory Visit: Payer: Self-pay

## 2019-09-29 DIAGNOSIS — Z87891 Personal history of nicotine dependence: Secondary | ICD-10-CM | POA: Diagnosis not present

## 2019-09-29 DIAGNOSIS — I10 Essential (primary) hypertension: Secondary | ICD-10-CM | POA: Diagnosis not present

## 2019-09-29 DIAGNOSIS — J45909 Unspecified asthma, uncomplicated: Secondary | ICD-10-CM | POA: Diagnosis not present

## 2019-09-29 DIAGNOSIS — R1310 Dysphagia, unspecified: Secondary | ICD-10-CM | POA: Diagnosis not present

## 2019-09-29 DIAGNOSIS — Z794 Long term (current) use of insulin: Secondary | ICD-10-CM | POA: Insufficient documentation

## 2019-09-29 DIAGNOSIS — M542 Cervicalgia: Secondary | ICD-10-CM | POA: Diagnosis not present

## 2019-09-29 DIAGNOSIS — U071 COVID-19: Secondary | ICD-10-CM | POA: Insufficient documentation

## 2019-09-29 DIAGNOSIS — R0789 Other chest pain: Secondary | ICD-10-CM | POA: Insufficient documentation

## 2019-09-29 DIAGNOSIS — Z79899 Other long term (current) drug therapy: Secondary | ICD-10-CM | POA: Insufficient documentation

## 2019-09-29 DIAGNOSIS — Z8616 Personal history of COVID-19: Secondary | ICD-10-CM

## 2019-09-29 HISTORY — DX: Personal history of COVID-19: Z86.16

## 2019-09-29 LAB — SARS CORONAVIRUS 2 (TAT 6-24 HRS): SARS Coronavirus 2: POSITIVE — AB

## 2019-09-29 MED ORDER — DIAZEPAM 2 MG PO TABS: 2.0000 mg | ORAL_TABLET | Freq: Four times a day (QID) | ORAL | 0 refills | Status: DC | PRN

## 2019-09-29 MED ORDER — DIAZEPAM 2 MG PO TABS: 5.0000 mg | ORAL_TABLET | Freq: Two times a day (BID) | ORAL | 0 refills | Status: DC

## 2019-09-29 NOTE — Telephone Encounter (Signed)
TOC CM received message from [facility] pharmacy that they are unable to fill Valium. They pharmacy does not fill narcotics or controlled substances. Contacted pt and she is requesting Rx be sent to [facility] on [address]. Contacted [facility] and they will need an escribed Rx for Valium. Notified ED provider, Dr Freida Busman to have RX sent to [facility]. Isidoro Donning RN CCM, [facility] ED TOC CM [phone]
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
## 2019-09-29 NOTE — Telephone Encounter (Signed)
Message received from ED provider, Rx is waiting to be picked up in [facility] ED. Pt will need to bring ID. TOC CM contacted pt to make her aware Rx for medication, Valium is waiting for her in the [facility] ED. Left HIPAA compliant message of voice mail for pt to return call. Isidoro Donning RN CCM, [facility] ED TOC CM [phone]

## 2019-09-29 NOTE — Telephone Encounter (Cosign Needed)
Social work is helping patient refill the medication.

## 2019-10-02 ENCOUNTER — Telehealth: Payer: Self-pay | Admitting: *Deleted

## 2019-10-02 ENCOUNTER — Other Ambulatory Visit: Payer: Self-pay | Admitting: Nurse Practitioner

## 2019-10-02 MED ORDER — FLUTICASONE PROPIONATE 50 MCG/ACT NA SUSP: 1.0000 | Freq: Every day | NASAL | 0 refills | Status: DC

## 2019-10-02 MED ORDER — CYCLOBENZAPRINE HCL 10 MG PO TABS: 10.0000 mg | ORAL_TABLET | Freq: Three times a day (TID) | ORAL | 0 refills | Status: DC | PRN

## 2019-10-02 MED ORDER — PSEUDOEPH-BROMPHEN-DM 30-2-10 MG/5ML PO SYRP: 5.0000 mL | ORAL_SOLUTION | Freq: Four times a day (QID) | ORAL | 0 refills | Status: DC | PRN

## 2019-10-02 NOTE — Telephone Encounter (Addendum)
TOC CM received call from pt requesting diazepam be sent to [facility], [facility] does not do controlled substances. Message sent to ED provider to escribe to [facility] [address]. Waiting for response. Isidoro Donning RN CCM, [facility] ED TOC Caryl Ada [phone]  10/02/2019 2:08 pm Unable to get issue resolved with Rx written on 09/29/2019. ED provider message received for pt to return to ED. Spoke to pt and states she is having increased pain and spasms and taking Advil and Meloxicam. She did call her PCP office and her 5/5 appt was changed to telephone appt. Explained to pt that ED provider wanted her to return to ED. States she believes she will because she will need to know what medication is safe to take with COVID. She has stiff neck, some shortness of breath at night and some during the day. She has been unable to wear CPAP at night. Pt wanted to know if she could take Tylenol, explained TOC CM will send message to her PCP. Pt plans to return to ED today. Isidoro Donning RN CCM, [facility] ED TOC CM [phone]

## 2019-10-05 ENCOUNTER — Inpatient Hospital Stay (HOSPITAL_COMMUNITY)
Admission: EM | Admit: 2019-10-05 | Discharge: 2019-10-09 | DRG: 177 | Disposition: A | Discharge status: Home / Self Care | Payer: Medicaid Other | Attending: Internal Medicine | Admitting: Internal Medicine

## 2019-10-05 ENCOUNTER — Encounter (HOSPITAL_COMMUNITY): Payer: Self-pay | Admitting: *Deleted

## 2019-10-05 ENCOUNTER — Other Ambulatory Visit: Payer: Self-pay

## 2019-10-05 ENCOUNTER — Emergency Department (HOSPITAL_COMMUNITY): Payer: Medicaid Other

## 2019-10-05 DIAGNOSIS — Z6839 Body mass index (BMI) 39.0-39.9, adult: Secondary | ICD-10-CM

## 2019-10-05 DIAGNOSIS — E1140 Type 2 diabetes mellitus with diabetic neuropathy, unspecified: Secondary | ICD-10-CM | POA: Diagnosis present

## 2019-10-05 DIAGNOSIS — E1165 Type 2 diabetes mellitus with hyperglycemia: Secondary | ICD-10-CM | POA: Diagnosis present

## 2019-10-05 DIAGNOSIS — I10 Essential (primary) hypertension: Secondary | ICD-10-CM | POA: Diagnosis present

## 2019-10-05 DIAGNOSIS — R0602 Shortness of breath: Secondary | ICD-10-CM | POA: Diagnosis present

## 2019-10-05 DIAGNOSIS — Z9641 Presence of insulin pump (external) (internal): Secondary | ICD-10-CM | POA: Diagnosis present

## 2019-10-05 DIAGNOSIS — R739 Hyperglycemia, unspecified: Secondary | ICD-10-CM | POA: Diagnosis not present

## 2019-10-05 DIAGNOSIS — J1282 Pneumonia due to coronavirus disease 2019: Secondary | ICD-10-CM | POA: Diagnosis present

## 2019-10-05 DIAGNOSIS — E782 Mixed hyperlipidemia: Secondary | ICD-10-CM | POA: Diagnosis present

## 2019-10-05 DIAGNOSIS — J9601 Acute respiratory failure with hypoxia: Secondary | ICD-10-CM | POA: Diagnosis present

## 2019-10-05 DIAGNOSIS — Z794 Long term (current) use of insulin: Secondary | ICD-10-CM

## 2019-10-05 DIAGNOSIS — Z833 Family history of diabetes mellitus: Secondary | ICD-10-CM | POA: Diagnosis not present

## 2019-10-05 DIAGNOSIS — Z87891 Personal history of nicotine dependence: Secondary | ICD-10-CM | POA: Diagnosis not present

## 2019-10-05 DIAGNOSIS — J45909 Unspecified asthma, uncomplicated: Secondary | ICD-10-CM | POA: Diagnosis present

## 2019-10-05 DIAGNOSIS — K219 Gastro-esophageal reflux disease without esophagitis: Secondary | ICD-10-CM | POA: Diagnosis present

## 2019-10-05 DIAGNOSIS — U071 COVID-19: Secondary | ICD-10-CM | POA: Diagnosis present

## 2019-10-05 DIAGNOSIS — G4733 Obstructive sleep apnea (adult) (pediatric): Secondary | ICD-10-CM | POA: Diagnosis present

## 2019-10-05 DIAGNOSIS — IMO0002 Reserved for concepts with insufficient information to code with codable children: Secondary | ICD-10-CM

## 2019-10-05 DIAGNOSIS — E1100 Type 2 diabetes mellitus with hyperosmolarity without nonketotic hyperglycemic-hyperosmolar coma (NKHHC): Secondary | ICD-10-CM

## 2019-10-05 DIAGNOSIS — J4520 Mild intermittent asthma, uncomplicated: Secondary | ICD-10-CM | POA: Diagnosis present

## 2019-10-05 DIAGNOSIS — E118 Type 2 diabetes mellitus with unspecified complications: Secondary | ICD-10-CM | POA: Diagnosis not present

## 2019-10-05 HISTORY — DX: COVID-19: U07.1

## 2019-10-05 LAB — COMPREHENSIVE METABOLIC PANEL
ALT: 26 U/L (ref 0–44)
AST: 19 U/L (ref 15–41)
Albumin: 3.7 g/dL (ref 3.5–5.0)
Alkaline Phosphatase: 90 U/L (ref 38–126)
Anion gap: 16 — ABNORMAL HIGH (ref 5–15)
BUN: 10 mg/dL (ref 6–20)
CO2: 21 mmol/L — ABNORMAL LOW (ref 22–32)
Calcium: 9.3 mg/dL (ref 8.9–10.3)
Chloride: 97 mmol/L — ABNORMAL LOW (ref 98–111)
Creatinine, Ser: 0.71 mg/dL (ref 0.44–1.00)
GFR calc Af Amer: >60 mL/min (ref >60)
GFR calc non Af Amer: >60 mL/min (ref >60)
Glucose, Bld: 317 mg/dL — ABNORMAL HIGH (ref 70–99)
Potassium: 3.7 mmol/L (ref 3.5–5.1)
Sodium: 134 mmol/L — ABNORMAL LOW (ref 135–145)
Total Bilirubin: 1.1 mg/dL (ref 0.3–1.2)
Total Protein: 8.1 g/dL (ref 6.5–8.1)

## 2019-10-05 LAB — D-DIMER, QUANTITATIVE: D-Dimer, Quant: 1.09 ug/mL-FEU — ABNORMAL HIGH (ref 0.00–0.50)

## 2019-10-05 LAB — CBC WITH DIFFERENTIAL/PLATELET
Abs Immature Granulocytes: 0.01 10*3/uL (ref 0.00–0.07)
Basophils Absolute: 0 10*3/uL (ref 0.0–0.1)
Basophils Relative: 1 %
Eosinophils Absolute: 0 10*3/uL (ref 0.0–0.5)
Eosinophils Relative: 0 %
HCT: 50.1 % — ABNORMAL HIGH (ref 36.0–46.0)
Hemoglobin: 16.7 g/dL — ABNORMAL HIGH (ref 12.0–15.0)
Immature Granulocytes: 0 %
Lymphocytes Relative: 49 %
Lymphs Abs: 2.1 10*3/uL (ref 0.7–4.0)
MCH: 29 pg (ref 26.0–34.0)
MCHC: 33.3 g/dL (ref 30.0–36.0)
MCV: 87.1 fL (ref 80.0–100.0)
Monocytes Absolute: 0.3 10*3/uL (ref 0.1–1.0)
Monocytes Relative: 6 %
Neutro Abs: 1.9 10*3/uL (ref 1.7–7.7)
Neutrophils Relative %: 44 %
Platelets: 254 10*3/uL (ref 150–400)
RBC: 5.75 MIL/uL — ABNORMAL HIGH (ref 3.87–5.11)
RDW: 11.7 % (ref 11.5–15.5)
WBC: 4.2 10*3/uL (ref 4.0–10.5)
nRBC: 0 % (ref 0.0–0.2)

## 2019-10-05 LAB — C-REACTIVE PROTEIN: CRP: 6.7 mg/dL — ABNORMAL HIGH (ref <1.0)

## 2019-10-05 LAB — I-STAT BETA HCG BLOOD, ED (MC, WL, AP ONLY): I-stat hCG, quantitative: <5 m[IU]/mL (ref <5)

## 2019-10-05 LAB — LACTIC ACID, PLASMA: Lactic Acid, Venous: 1.7 mmol/L (ref 0.5–1.9)

## 2019-10-05 LAB — LACTATE DEHYDROGENASE: LDH: 271 U/L — ABNORMAL HIGH (ref 98–192)

## 2019-10-05 LAB — MRSA PCR SCREENING: MRSA by PCR: NEGATIVE

## 2019-10-05 LAB — FERRITIN: Ferritin: 514 ng/mL — ABNORMAL HIGH (ref 11–307)

## 2019-10-05 LAB — PROCALCITONIN: Procalcitonin: <0.1 ng/mL

## 2019-10-05 LAB — CBG MONITORING, ED: Glucose-Capillary: 293 mg/dL — ABNORMAL HIGH (ref 70–99)

## 2019-10-05 LAB — GLUCOSE, CAPILLARY: Glucose-Capillary: 244 mg/dL — ABNORMAL HIGH (ref 70–99)

## 2019-10-05 LAB — FIBRINOGEN: Fibrinogen: 757 mg/dL — ABNORMAL HIGH (ref 210–475)

## 2019-10-05 LAB — TRIGLYCERIDES: Triglycerides: 246 mg/dL — ABNORMAL HIGH (ref <150)

## 2019-10-05 MED ORDER — CLONIDINE HCL 0.1 MG PO TABS
0.1000 mg | ORAL_TABLET | Freq: Every day | ORAL | Status: DC
Start: 2019-10-11 — End: 2019-10-05

## 2019-10-05 MED ORDER — CLONIDINE HCL 0.1 MG PO TABS: 0.1000 mg | ORAL_TABLET | Freq: Four times a day (QID) | ORAL | Status: DC

## 2019-10-05 MED ORDER — CALCIUM CARBONATE ANTACID 500 MG PO CHEW
1.0000 | CHEWABLE_TABLET | Freq: Two times a day (BID) | ORAL | Status: AC
  Administered 2019-10-05 – 2019-10-07 (×5): 200 mg via ORAL
  Filled 2019-10-05 (×5): qty 1

## 2019-10-05 MED ORDER — ACETAMINOPHEN 500 MG PO TABS
500.0000 mg | ORAL_TABLET | Freq: Four times a day (QID) | ORAL | Status: DC | PRN
  Administered 2019-10-05 – 2019-10-06 (×2): 500 mg via ORAL
  Filled 2019-10-05 (×2): qty 1

## 2019-10-05 MED ORDER — ENOXAPARIN SODIUM 40 MG/0.4ML ~~LOC~~ SOLN
40.0000 mg | SUBCUTANEOUS | Status: DC
  Administered 2019-10-05: 40 mg via SUBCUTANEOUS
  Filled 2019-10-05: qty 0.4

## 2019-10-05 MED ORDER — INSULIN DETEMIR 100 UNIT/ML ~~LOC~~ SOLN
0.1500 [IU]/kg | Freq: Two times a day (BID) | SUBCUTANEOUS | Status: DC
  Administered 2019-10-05 – 2019-10-06 (×2): 16 [IU] via SUBCUTANEOUS
  Filled 2019-10-05 (×4): qty 0.16

## 2019-10-05 MED ORDER — NAPROXEN 250 MG PO TABS
500.0000 mg | ORAL_TABLET | Freq: Two times a day (BID) | ORAL | Status: DC | PRN
Start: 2019-10-05 — End: 2019-10-05

## 2019-10-05 MED ORDER — PANTOPRAZOLE SODIUM 40 MG PO TBEC
80.0000 mg | DELAYED_RELEASE_TABLET | Freq: Every day | ORAL | Status: DC
  Administered 2019-10-05: 80 mg via ORAL

## 2019-10-05 MED ORDER — ONDANSETRON 4 MG PO TBDP: 4.0000 mg | ORAL_TABLET | Freq: Four times a day (QID) | ORAL | Status: DC | PRN

## 2019-10-05 MED ORDER — SODIUM CHLORIDE 0.9 % IV BOLUS
1000.0000 mL | Freq: Once | INTRAVENOUS | Status: AC
  Administered 2019-10-05: 1000 mL via INTRAVENOUS

## 2019-10-05 MED ORDER — HYDROXYZINE HCL 25 MG PO TABS: 25.0000 mg | ORAL_TABLET | Freq: Four times a day (QID) | ORAL | Status: DC | PRN

## 2019-10-05 MED ORDER — CLONIDINE HCL 0.1 MG PO TABS
0.1000 mg | ORAL_TABLET | ORAL | Status: DC
Start: 2019-10-08 — End: 2019-10-05

## 2019-10-05 MED ORDER — METHOCARBAMOL 500 MG PO TABS: 500.0000 mg | ORAL_TABLET | Freq: Three times a day (TID) | ORAL | Status: DC | PRN

## 2019-10-05 MED ORDER — ORAL CARE MOUTH RINSE
15.0000 mL | Freq: Two times a day (BID) | OROMUCOSAL | Status: DC
  Administered 2019-10-05 – 2019-10-09 (×7): 15 mL via OROMUCOSAL

## 2019-10-05 MED ORDER — TRAMADOL HCL 50 MG PO TABS
50.0000 mg | ORAL_TABLET | Freq: Four times a day (QID) | ORAL | Status: DC | PRN
  Administered 2019-10-05 – 2019-10-08 (×7): 50 mg via ORAL
  Filled 2019-10-05 (×7): qty 1

## 2019-10-05 MED ORDER — LISINOPRIL 20 MG PO TABS
20.0000 mg | ORAL_TABLET | Freq: Every day | ORAL | Status: DC
  Administered 2019-10-06 – 2019-10-09 (×4): 20 mg via ORAL
  Filled 2019-10-05 (×2): qty 2
  Filled 2019-10-05 (×2): qty 1

## 2019-10-05 MED ORDER — LOPERAMIDE HCL 2 MG PO CAPS
2.0000 mg | ORAL_CAPSULE | ORAL | Status: DC | PRN
Start: 2019-10-05 — End: 2019-10-05

## 2019-10-05 MED ORDER — DICYCLOMINE HCL 20 MG PO TABS: 20.0000 mg | ORAL_TABLET | Freq: Four times a day (QID) | ORAL | Status: DC | PRN

## 2019-10-05 MED ORDER — INSULIN ASPART 100 UNIT/ML ~~LOC~~ SOLN
6.0000 [IU] | Freq: Three times a day (TID) | SUBCUTANEOUS | Status: DC
  Administered 2019-10-05 – 2019-10-06 (×4): 6 [IU] via SUBCUTANEOUS
  Filled 2019-10-05: qty 0.06

## 2019-10-05 MED ORDER — PSEUDOEPH-BROMPHEN-DM 30-2-10 MG/5ML PO SYRP: 5.0000 mL | ORAL_SOLUTION | ORAL | Status: DC | PRN

## 2019-10-05 MED ORDER — INSULIN ASPART 100 UNIT/ML ~~LOC~~ SOLN
0.0000 [IU] | Freq: Three times a day (TID) | SUBCUTANEOUS | Status: DC
  Administered 2019-10-05: 11 [IU] via SUBCUTANEOUS
  Administered 2019-10-06: 7 [IU] via SUBCUTANEOUS
  Administered 2019-10-06: 11 [IU] via SUBCUTANEOUS
  Administered 2019-10-06 – 2019-10-07 (×3): 15 [IU] via SUBCUTANEOUS
  Administered 2019-10-07: 20 [IU] via SUBCUTANEOUS
  Administered 2019-10-08: 15 [IU] via SUBCUTANEOUS
  Administered 2019-10-08 – 2019-10-09 (×3): 11 [IU] via SUBCUTANEOUS
  Filled 2019-10-05: qty 0.2

## 2019-10-05 MED ORDER — INSULIN ASPART 100 UNIT/ML ~~LOC~~ SOLN
0.0000 [IU] | Freq: Every day | SUBCUTANEOUS | Status: DC
  Administered 2019-10-05: 2 [IU] via SUBCUTANEOUS
  Administered 2019-10-06 – 2019-10-07 (×2): 4 [IU] via SUBCUTANEOUS
  Administered 2019-10-08: 3 [IU] via SUBCUTANEOUS
  Filled 2019-10-05: qty 0.05

## 2019-10-05 MED ORDER — METOPROLOL TARTRATE 5 MG/5ML IV SOLN: 5.0000 mg | Freq: Four times a day (QID) | INTRAVENOUS | Status: DC | PRN

## 2019-10-05 NOTE — H&P (Signed)
Sarah Garrison is an 39 y.o. female.   Chief Complaint:  Chief Complaint  Patient presents with  . COVID+  . Shortness of Breath    HPI: COVID(+) presents to ED w/ worsening SOB.   In ED: SaO2 ok but tachycardic, tachypneic.   Past Medical History:  Diagnosis Date  . Asthma   . COVID-19   . Diabetes mellitus without complication ([place])   . GERD (gastroesophageal reflux disease)   . Hypertension     Past Surgical History:  Procedure Laterality Date  . CESAREAN SECTION    . HERNIA REPAIR    . TUBAL LIGATION    . VENTRAL HERNIA REPAIR N/A 11/27/2017   Procedure: [place];  Surgeon: Erroll Luna, MD;  Location: [facility];  Service: General;  Laterality: N/A;  . WISDOM TOOTH EXTRACTION      Family History  Problem Relation Age of Onset  . Asthma Mother   . Kidney failure Mother   . Brain cancer Mother   . Asthma Father   . Other Father        surgery on stomach but don't know from what  . Diabetes Brother   . Diabetes Maternal Grandmother    Social History:  reports that she quit smoking about 22 months ago. Her smoking use included cigarettes. She smoked 0.10 packs per day. She has never used smokeless tobacco. She reports that she does not drink alcohol or use drugs.  Allergies:  Allergies  Allergen Reactions  . Morphine And Related Shortness Of Breath  . Glyburide Diarrhea and Other (See Comments)    Reaction:  Nose bleeds   . Ivp Dye [Iodinated Diagnostic Agents] Other (See Comments)    Shortness of breath.    . Oxycodone Other (See Comments)    Pt states that this medication makes her "dream about rabbits chasing" her.    . Vicodin [Hydrocodone-Acetaminophen] Other (See Comments)    Pt states that this medication makes her "dream about rabbits chasing" her.      (Not in a hospital admission)   Results for orders placed or performed during the hospital encounter of 10/05/19 (from the past 48 hour(s))  Lactic  acid, plasma     Status: None   Collection Time: 10/05/19 11:35 AM  Result Value Ref Range   Lactic Acid, Venous 1.7 0.5 - 1.9 mmol/L    Comment: Performed at [facility], [place] [address] [place], [place] [address]  CBC WITH DIFFERENTIAL     Status: Abnormal   Collection Time: 10/05/19 11:35 AM  Result Value Ref Range   WBC 4.2 4.0 - 10.5 K/uL   RBC 5.75 (H) 3.87 - 5.11 MIL/uL   Hemoglobin 16.7 (H) 12.0 - 15.0 g/dL   HCT 50.1 (H) 36.0 - 46.0 %   MCV 87.1 80.0 - 100.0 fL   MCH 29.0 26.0 - 34.0 pg   MCHC 33.3 30.0 - 36.0 g/dL   RDW 11.7 11.5 - 15.5 %   Platelets 254 150 - 400 K/uL   nRBC 0.0 0.0 - 0.2 %   Neutrophils Relative % 44 %   Neutro Abs 1.9 1.7 - 7.7 K/uL   Lymphocytes Relative 49 %   Lymphs Abs 2.1 0.7 - 4.0 K/uL   Monocytes Relative 6 %   Monocytes Absolute 0.3 0.1 - 1.0 K/uL   Eosinophils Relative 0 %   Eosinophils Absolute 0.0 0.0 - 0.5 K/uL   Basophils Relative 1 %  Basophils Absolute 0.0 0.0 - 0.1 K/uL   Immature Granulocytes 0 %   Abs Immature Granulocytes 0.01 0.00 - 0.07 K/uL   Reactive, Benign Lymphocytes PRESENT     Comment: Performed at [facility], [address] [place], [place] [address]  Comprehensive metabolic panel     Status: Abnormal   Collection Time: 10/05/19 11:35 AM  Result Value Ref Range   Sodium 134 (L) 135 - 145 mmol/L   Potassium 3.7 3.5 - 5.1 mmol/L   Chloride 97 (L) 98 - 111 mmol/L   CO2 21 (L) 22 - 32 mmol/L   Glucose, Bld 317 (H) 70 - 99 mg/dL    Comment: Glucose reference range applies only to samples taken after fasting for at least 8 hours.   BUN 10 6 - 20 mg/dL   Creatinine, Ser 8.65 0.44 - 1.00 mg/dL   Calcium 9.3 8.9 - 78.4 mg/dL   Total Protein 8.1 6.5 - 8.1 g/dL   Albumin 3.7 3.5 - 5.0 g/dL   AST 19 15 - 41 U/L   ALT 26 0 - 44 U/L   Alkaline Phosphatase 90 38 - 126 U/L   Total Bilirubin 1.1 0.3 - 1.2 mg/dL   GFR calc non Af Amer >60 >60 mL/min   GFR calc Af Amer >60 >60 mL/min    Anion gap 16 (H) 5 - 15    Comment: Performed at [facility], [address], [place], [place] [address]  D-dimer, quantitative     Status: Abnormal   Collection Time: 10/05/19 11:35 AM  Result Value Ref Range   D-Dimer, Quant 1.09 (H) 0.00 - 0.50 ug/mL-FEU    Comment: (NOTE) At the manufacturer cut-off of 0.50 ug/mL FEU, this assay has been documented to exclude PE with a sensitivity and negative predictive value of 97 to 99%.  At this time, this assay has not been approved by the FDA to exclude DVT/VTE. Results should be correlated with clinical presentation. Performed at [facility], [address], [place], [place] [address]   Procalcitonin     Status: None   Collection Time: 10/05/19 11:35 AM  Result Value Ref Range   Procalcitonin <0.10 ng/mL    Comment:        Interpretation: PCT (Procalcitonin) <= 0.5 ng/mL: Systemic infection (sepsis) is not likely. Local bacterial infection is possible. (NOTE)       Sepsis PCT Algorithm           Lower Respiratory Tract                                      Infection PCT Algorithm    ----------------------------     ----------------------------         PCT < 0.25 ng/mL                PCT < 0.10 ng/mL         Strongly encourage             Strongly discourage   discontinuation of antibiotics    initiation of antibiotics    ----------------------------     -----------------------------       PCT 0.25 - 0.50 ng/mL            PCT 0.10 - 0.25 ng/mL               OR       >  80% decrease in PCT            Discourage initiation of                                            antibiotics      Encourage discontinuation           of antibiotics    ----------------------------     -----------------------------         PCT >= 0.50 ng/mL              PCT 0.26 - 0.50 ng/mL               AND        <80% decrease in PCT             Encourage initiation of                                              antibiotics       Encourage continuation           of antibiotics    ----------------------------     -----------------------------        PCT >= 0.50 ng/mL                  PCT > 0.50 ng/mL               AND         increase in PCT                  Strongly encourage                                      initiation of antibiotics    Strongly encourage escalation           of antibiotics                                     -----------------------------                                           PCT <= 0.25 ng/mL                                                 OR                                        > 80% decrease in PCT                                     Discontinue / Do not initiate  antibiotics Performed at [facility], [address], [place], [place] [address]   Lactate dehydrogenase     Status: Abnormal   Collection Time: 10/05/19 11:35 AM  Result Value Ref Range   LDH 271 (H) 98 - 192 U/L    Comment: Performed at [facility], [address] [place], [place] [address]  Ferritin     Status: Abnormal   Collection Time: 10/05/19 11:35 AM  Result Value Ref Range   Ferritin 514 (H) 11 - 307 ng/mL    Comment: Performed at [facility], [address], [place], [place] [address]  Triglycerides     Status: Abnormal   Collection Time: 10/05/19 11:35 AM  Result Value Ref Range   Triglycerides 246 (H) <150 mg/dL    Comment: Performed at [facility], [address], [place], [place] [address]  Fibrinogen     Status: Abnormal   Collection Time: 10/05/19 11:35 AM  Result Value Ref Range   Fibrinogen 757 (H) 210 - 475 mg/dL    Comment: Performed at [facility], [address], [place], [place] [address]  C-reactive protein     Status: Abnormal   Collection Time: 10/05/19 11:35 AM  Result Value Ref Range   CRP 6.7 (H) <1.0 mg/dL    Comment:  Performed at [facility], [address], [place], [place] [address]  I-Stat beta hCG blood, ED     Status: None   Collection Time: 10/05/19 11:41 AM  Result Value Ref Range   I-stat hCG, quantitative <5.0 <5 mIU/mL   Comment 3            Comment:   GEST. AGE      CONC.  (mIU/mL)   <=1 WEEK        5 - 50     2 WEEKS       50 - 500     3 WEEKS       100 - 10,000     4 WEEKS     1,000 - 30,000        FEMALE AND NON-PREGNANT FEMALE:     LESS THAN 5 mIU/mL    DG CHEST PORT 1 VIEW  Result Date: 10/05/2019 CLINICAL DATA:  Shortness of breath and near syncope. COVID-19 positive EXAM: PORTABLE CHEST 1 VIEW COMPARISON:  September 16, 2019. FINDINGS: There is slight atelectasis in the right base. The lungs elsewhere are clear. Heart size and pulmonary vascularity are normal. No adenopathy. No bone lesions. IMPRESSION: Slight right base atelectasis. Question earliest changes of pneumonia. Lungs elsewhere clear. Cardiac silhouette within normal limits. Electronically Signed   By: Bretta Bang III M.D.   On: 10/05/2019 12:03    Review of Systems  Constitutional: Positive for fatigue.  HENT: Positive for congestion.   Eyes: Negative for visual disturbance.  Respiratory: Positive for cough, chest tightness and shortness of breath. Negative for apnea.   Cardiovascular: Positive for chest pain. Negative for palpitations and leg swelling.  Gastrointestinal: Negative for abdominal pain, constipation, diarrhea and nausea.  Genitourinary: Negative for difficulty urinating.  Musculoskeletal: Positive for arthralgias.  Skin: Negative for rash.  Neurological: Negative for dizziness, syncope and light-headedness.  Psychiatric/Behavioral: Negative for behavioral problems and confusion.    Blood pressure (!) 138/107, pulse (!) 133, temperature 98.2 F (36.8 C), temperature source Oral, resp. rate (!) 29, height 5\' 4"  (1.626 m), weight 108.9 kg, SpO2 100 %. Physical Exam  Constitutional:  She is oriented to person, place, and time. She appears well-developed and well-nourished. She appears distressed.  HENT:  Head: Normocephalic and atraumatic.  Eyes: Conjunctivae and EOM are normal.  Neck: No tracheal deviation present. No thyromegaly present.  Cardiovascular: Regular rhythm and normal heart sounds. Tachycardia present.  No murmur heard. Respiratory: Breath sounds normal. She is in respiratory distress.  GI: There is no abdominal tenderness.  Musculoskeletal:        General: No edema.  Neurological: She is alert and oriented to person, place, and time.  Skin: Skin is warm and dry.  Psychiatric: Thought content normal.       Assessment/Plan  Patient Active Problem List   Diagnosis Date Noted  . COVID-19 10/05/2019  . Hypertension 10/05/2019  . OSA (obstructive sleep apnea) 02/16/2019  . 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 Infective otitis externa of left ear 01/01/2019  . Essential hypertension 06/22/2018  . Mixed hyperlipidemia 06/22/2018  . Uncontrolled type 2 diabetes mellitus with complication, with long-term current use of insulin (HCC) 12/13/2016  . Non-adherence to medical treatment 12/13/2016  . Asthma 07/30/2016  . 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/07/2016  . Encounter for pregnancy test 02/01/2016  . Leg swelling 09/29/2015  . Morbid obesity, unspecified obesity type (HCC) 09/29/2015  . DM neuropathy, type II diabetes mellitus (HCC) 08/24/2015   COVID19 --> Supplemental O2, remdesivir, Decadron  Hypertension BP Readings from Last 3 Encounters:  10/05/19 (!) 142/113  09/29/19 125/90  09/19/19 118/85  --> monitor, restart home meds   OSA --> CPAP qhs  --> low threshold for BIPAP  Insulin-dependent type 2 diabetes --> insulin per protocol here  Asthma mild intermittent  --> albuterol prn     Sunnie NielsenNatalie Mailey Landstrom, DO 10/05/2019, 12:42 PM
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
## 2019-10-05 NOTE — ED Triage Notes (Signed)
Pt Covid + 09/29/19. During night increased SHOB. Tachypnea and tachy during triage

## 2019-10-05 NOTE — Plan of Care (Signed)
Following through with new orders - oxygen at 2 liters- independent

## 2019-10-05 NOTE — Progress Notes (Signed)
Pt is COVID positive and cannot wear hospital CPAP while COVID positive.  Pt sleep with HOB elevated and O2 on.

## 2019-10-06 DIAGNOSIS — Z794 Long term (current) use of insulin: Secondary | ICD-10-CM

## 2019-10-06 DIAGNOSIS — E1165 Type 2 diabetes mellitus with hyperglycemia: Secondary | ICD-10-CM

## 2019-10-06 DIAGNOSIS — E118 Type 2 diabetes mellitus with unspecified complications: Secondary | ICD-10-CM

## 2019-10-06 LAB — CBC
HCT: 49.7 % — ABNORMAL HIGH (ref 36.0–46.0)
Hemoglobin: 16.9 g/dL — ABNORMAL HIGH (ref 12.0–15.0)
MCH: 29.4 pg (ref 26.0–34.0)
MCHC: 34 g/dL (ref 30.0–36.0)
MCV: 86.4 fL (ref 80.0–100.0)
Platelets: 202 10*3/uL (ref 150–400)
RBC: 5.75 MIL/uL — ABNORMAL HIGH (ref 3.87–5.11)
RDW: 11.8 % (ref 11.5–15.5)
WBC: 4.7 10*3/uL (ref 4.0–10.5)
nRBC: 0 % (ref 0.0–0.2)

## 2019-10-06 LAB — COMPREHENSIVE METABOLIC PANEL
ALT: 19 U/L (ref 0–44)
AST: 14 U/L — ABNORMAL LOW (ref 15–41)
Albumin: 3 g/dL — ABNORMAL LOW (ref 3.5–5.0)
Alkaline Phosphatase: 64 U/L (ref 38–126)
Anion gap: 13 (ref 5–15)
BUN: 13 mg/dL (ref 6–20)
CO2: 24 mmol/L (ref 22–32)
Calcium: 8.9 mg/dL (ref 8.9–10.3)
Chloride: 101 mmol/L (ref 98–111)
Creatinine, Ser: 0.53 mg/dL (ref 0.44–1.00)
GFR calc Af Amer: >60 mL/min (ref >60)
GFR calc non Af Amer: >60 mL/min (ref >60)
Glucose, Bld: 236 mg/dL — ABNORMAL HIGH (ref 70–99)
Potassium: 3.5 mmol/L (ref 3.5–5.1)
Sodium: 138 mmol/L (ref 135–145)
Total Bilirubin: 0.5 mg/dL (ref 0.3–1.2)
Total Protein: 6.4 g/dL — ABNORMAL LOW (ref 6.5–8.1)

## 2019-10-06 LAB — GLUCOSE, CAPILLARY
Glucose-Capillary: 226 mg/dL — ABNORMAL HIGH (ref 70–99)
Glucose-Capillary: 261 mg/dL — ABNORMAL HIGH (ref 70–99)
Glucose-Capillary: 328 mg/dL — ABNORMAL HIGH (ref 70–99)
Glucose-Capillary: 335 mg/dL — ABNORMAL HIGH (ref 70–99)

## 2019-10-06 LAB — C-REACTIVE PROTEIN: CRP: 3.2 mg/dL — ABNORMAL HIGH (ref <1.0)

## 2019-10-06 LAB — FERRITIN: Ferritin: 374 ng/mL — ABNORMAL HIGH (ref 11–307)

## 2019-10-06 LAB — D-DIMER, QUANTITATIVE: D-Dimer, Quant: 2.73 ug/mL-FEU — ABNORMAL HIGH (ref 0.00–0.50)

## 2019-10-06 MED ORDER — INSULIN ASPART 100 UNIT/ML ~~LOC~~ SOLN
10.0000 [IU] | Freq: Three times a day (TID) | SUBCUTANEOUS | Status: DC
  Administered 2019-10-07 – 2019-10-08 (×4): 10 [IU] via SUBCUTANEOUS

## 2019-10-06 MED ORDER — GUAIFENESIN-DM 100-10 MG/5ML PO SYRP
5.0000 mL | ORAL_SOLUTION | ORAL | Status: DC | PRN
  Administered 2019-10-06 – 2019-10-08 (×5): 5 mL via ORAL
  Filled 2019-10-06 (×6): qty 10

## 2019-10-06 MED ORDER — SODIUM CHLORIDE 0.9 % IV SOLN
100.0000 mg | Freq: Once | INTRAVENOUS | Status: AC
  Administered 2019-10-06: 100 mg via INTRAVENOUS
  Filled 2019-10-06: qty 20

## 2019-10-06 MED ORDER — CHLORHEXIDINE GLUCONATE CLOTH 2 % EX PADS
6.0000 | MEDICATED_PAD | Freq: Every day | CUTANEOUS | Status: DC
  Administered 2019-10-06: 6 via TOPICAL

## 2019-10-06 MED ORDER — CYCLOBENZAPRINE HCL 10 MG PO TABS
10.0000 mg | ORAL_TABLET | Freq: Three times a day (TID) | ORAL | Status: DC | PRN
  Administered 2019-10-06 – 2019-10-07 (×3): 10 mg via ORAL
  Filled 2019-10-06 (×3): qty 1

## 2019-10-06 MED ORDER — LIP MEDEX EX OINT
TOPICAL_OINTMENT | CUTANEOUS | Status: AC
  Administered 2019-10-06: 1 via TOPICAL
  Filled 2019-10-06: qty 7

## 2019-10-06 MED ORDER — FLUTICASONE PROPIONATE 50 MCG/ACT NA SUSP
1.0000 | Freq: Every day | NASAL | Status: DC
  Administered 2019-10-06 – 2019-10-09 (×3): 1 via NASAL
  Filled 2019-10-06: qty 16

## 2019-10-06 MED ORDER — ALBUTEROL SULFATE HFA 108 (90 BASE) MCG/ACT IN AERS
1.0000 | INHALATION_SPRAY | Freq: Four times a day (QID) | RESPIRATORY_TRACT | Status: DC | PRN
  Administered 2019-10-06: 2 via RESPIRATORY_TRACT
  Filled 2019-10-06: qty 6.7

## 2019-10-06 MED ORDER — SODIUM CHLORIDE 0.9 % IV SOLN
100.0000 mg | Freq: Every day | INTRAVENOUS | Status: DC
  Administered 2019-10-07 – 2019-10-09 (×3): 100 mg via INTRAVENOUS
  Filled 2019-10-06 (×3): qty 20

## 2019-10-06 MED ORDER — DEXAMETHASONE SODIUM PHOSPHATE 10 MG/ML IJ SOLN
6.0000 mg | INTRAMUSCULAR | Status: DC
  Administered 2019-10-06 – 2019-10-09 (×4): 6 mg via INTRAVENOUS
  Filled 2019-10-06 (×4): qty 1

## 2019-10-06 MED ORDER — INSULIN DETEMIR 100 UNIT/ML ~~LOC~~ SOLN
25.0000 [IU] | Freq: Two times a day (BID) | SUBCUTANEOUS | Status: DC
  Administered 2019-10-06 – 2019-10-08 (×4): 25 [IU] via SUBCUTANEOUS
  Filled 2019-10-06 (×4): qty 0.25

## 2019-10-06 MED ORDER — PHENOL 1.4 % MT LIQD
1.0000 | OROMUCOSAL | Status: DC | PRN
  Administered 2019-10-06: 1 via OROMUCOSAL
  Filled 2019-10-06: qty 177

## 2019-10-06 MED ORDER — LIP MEDEX EX OINT: 1.0000 "application " | TOPICAL_OINTMENT | CUTANEOUS | Status: DC | PRN

## 2019-10-06 MED ORDER — PANTOPRAZOLE SODIUM 40 MG PO TBEC
40.0000 mg | DELAYED_RELEASE_TABLET | Freq: Every day | ORAL | Status: DC
  Administered 2019-10-06 – 2019-10-09 (×4): 40 mg via ORAL
  Filled 2019-10-06 (×4): qty 1

## 2019-10-06 MED ORDER — DICLOFENAC SODIUM 75 MG PO TBEC
75.0000 mg | DELAYED_RELEASE_TABLET | Freq: Two times a day (BID) | ORAL | Status: DC
  Administered 2019-10-06 – 2019-10-09 (×7): 75 mg via ORAL
  Filled 2019-10-06 (×9): qty 1

## 2019-10-06 MED ORDER — SALINE SPRAY 0.65 % NA SOLN
1.0000 | NASAL | Status: DC | PRN
  Administered 2019-10-06 – 2019-10-07 (×2): 1 via NASAL
  Filled 2019-10-06: qty 44

## 2019-10-06 MED ORDER — ENOXAPARIN SODIUM 60 MG/0.6ML ~~LOC~~ SOLN
0.5000 mg/kg | SUBCUTANEOUS | Status: DC
  Administered 2019-10-06 – 2019-10-08 (×3): 50 mg via SUBCUTANEOUS
  Filled 2019-10-06 (×2): qty 0.6
  Filled 2019-10-06: qty 0.5

## 2019-10-06 NOTE — Progress Notes (Signed)
Inpatient Diabetes Program Recommendations  AACE/ADA: New Consensus Statement on Inpatient Glycemic Control (2015)  Target Ranges:  Prepandial:   less than 140 mg/dL      Peak postprandial:   less than 180 mg/dL (1-2 hours)      Critically ill patients:  140 - 180 mg/dL   Lab Results  Component Value Date   GLUCAP 261 (H) 10/06/2019   HGBA1C 13.0 (A) 02/10/2018    Review of Glycemic Control  Diabetes history: DM2 Outpatient Diabetes medications: Insulin pump - Last pump basal rate- 2.45 units / Hour = 59 units/day Current orders for Inpatient glycemic control: Levemir 16 units bid, Novolog 0-20 units tidwc and hs + 6 units tidwc.  On Decadron 6 mg Q24H Need updated HgbA1C.  Inpatient Diabetes Program Recommendations:     Increase Levemir to 25 units bid Increase Novolog meal coverage to 10 units tidwc Need updated HgbA1C to assess glycemic control PTA.  Continue to follow.  Thank you. Ailene Ards, RD, LDN, CDE Inpatient Diabetes Coordinator [phone]

## 2019-10-06 NOTE — Progress Notes (Signed)
Pt unable to utilize CPAP QHS due to ++COVID.
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
## 2019-10-07 ENCOUNTER — Encounter: Payer: Self-pay | Admitting: Nurse Practitioner

## 2019-10-07 ENCOUNTER — Ambulatory Visit: Payer: Medicaid Other

## 2019-10-07 LAB — GLUCOSE, CAPILLARY
Glucose-Capillary: 343 mg/dL — ABNORMAL HIGH (ref 70–99)
Glucose-Capillary: 314 mg/dL — ABNORMAL HIGH (ref 70–99)
Glucose-Capillary: 385 mg/dL — ABNORMAL HIGH (ref 70–99)
Glucose-Capillary: 348 mg/dL — ABNORMAL HIGH (ref 70–99)

## 2019-10-07 LAB — HEMOGLOBIN A1C
Hgb A1c MFr Bld: 12.2 % — ABNORMAL HIGH (ref 4.8–5.6)
Mean Plasma Glucose: 303.44 mg/dL

## 2019-10-07 NOTE — Progress Notes (Signed)
   10/07/19 1625  Assess: MEWS Score  Temp 98.1 F (36.7 C)  BP 123/83  Pulse Rate (!) 117  Resp 20  SpO2 100 %  Assess: MEWS Score  MEWS Temp 0  MEWS Systolic 0  MEWS Pulse 2  MEWS RR 0  MEWS LOC 0  MEWS Score 2  MEWS Score Color Yellow  Patient transferred from ICU. Heart rate in 120s-130s with activity. At rest rate is 110's. Md notified and orders for telemetry extended. Continue to monitor. Melton Alar, RN

## 2019-10-07 NOTE — Progress Notes (Signed)
Patient has been fairly anxious since received from ICU. Gets some SOB with minimal exertion and O2 remains at 2 liters/min. Able to talk on the phone to family. Heart rate has been from 100's at rest up to 130's with activity. Continue to monitor on telemetry. Melton Alar, RN

## 2019-10-07 NOTE — TOC Initial Note (Signed)
Transition of Care [facility]) - Initial/Assessment Note    Patient Details  Name: Sarah Garrison MRN: [id number] Date of Birth: 1981/02/09  [facility] of [facility]) CM/SW Contact:    Golda Acre, RN Phone Number: 10/07/2019, 12:46 PM  Clinical Narrative:                 covid positive  Expected Discharge Plan: Home/Self Care Barriers to Discharge: Continued Medical Work up   Patient Goals and CMS Choice Patient states their goals for this hospitalization and ongoing recovery are:: to be able to go home      Expected Discharge Plan and Services Expected Discharge Plan: Home/Self Care       Living arrangements for the past 2 months: Single Family Home                                      Prior Living Arrangements/Services Living arrangements for the past 2 months: Single Family Home Lives with:: Spouse Patient language and need for interpreter reviewed:: No Do you feel safe going back to the place where you live?: Yes      Need for Family Participation in Patient Care: Yes (Comment) Care giver support system in place?: Yes (comment)   Criminal Activity/Legal Involvement Pertinent to Current Situation/Hospitalization: No - Comment as needed  Activities of Daily Living Home Assistive Devices/Equipment: CBG Meter, Insulin Pump ADL Screening (condition at time of admission) Patient's cognitive ability adequate to safely complete daily activities?: Yes Is the patient deaf or have difficulty hearing?: No Does the patient have difficulty seeing, even when wearing glasses/contacts?: No Does the patient have difficulty concentrating, remembering, or making decisions?: No Patient able to express need for assistance with ADLs?: Yes Does the patient have difficulty dressing or bathing?: No Independently performs ADLs?: Yes (appropriate for developmental age)(having shortness of breath) Does the patient have difficulty walking or climbing stairs?: Yes(secondary to  shortness of breath) Weakness of Legs: Both Weakness of Arms/Hands: None  Permission Sought/Granted                  Emotional Assessment Appearance:: Appears stated age     Orientation: : Oriented to Self, Oriented to Place, Oriented to  Time, Oriented to Situation Alcohol / Substance Use: Not Applicable Psych Involvement: No (comment)  Admission diagnosis:  SOB (shortness of breath) [R06.02] Hyperglycemia [R73.9] COVID-19 [U07.1] Patient Active Problem List   Diagnosis Date Noted  . COVID-19 10/05/2019  . Hypertension 10/05/2019  . SOB (shortness of breath)   . Hyperglycemia   . OSA (obstructive sleep apnea) 02/16/2019  . 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 Infective otitis externa of left ear 01/01/2019  . Essential hypertension 06/22/2018  . Mixed hyperlipidemia 06/22/2018  . Uncontrolled type 2 diabetes mellitus with complication, with long-term current use of insulin (HCC) 12/13/2016  . Non-adherence to medical treatment 12/13/2016  . Asthma 07/30/2016  . 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/07/2016  . Encounter for pregnancy test 02/01/2016  . Leg swelling 09/29/2015  . Morbid obesity, unspecified obesity type (HCC) 09/29/2015  . DM neuropathy, type II diabetes mellitus (HCC) 08/24/2015   PCP:  System, Provider Not In Pharmacy:   [id number] [place], [place] - [address] AT [facility] OF [facility] [address] [place] [address] Phone: [phone] Fax: [phone]  [facility] - [place], [place] - [place] [address] [employer] [phone]  [place] [address] Phone: [phone] Fax: [phone]     Social Determinants of Health (SDOH) Interventions    Readmission Risk Interventions No flowsheet data found.
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
## 2019-10-07 NOTE — Progress Notes (Signed)
Nocturnal cpap held due to positive covid status. 

## 2019-10-08 ENCOUNTER — Encounter: Payer: Self-pay | Admitting: Nurse Practitioner

## 2019-10-08 LAB — COMPREHENSIVE METABOLIC PANEL
ALT: 19 U/L (ref 0–44)
AST: 13 U/L — ABNORMAL LOW (ref 15–41)
Albumin: 3 g/dL — ABNORMAL LOW (ref 3.5–5.0)
Alkaline Phosphatase: 68 U/L (ref 38–126)
Anion gap: 8 (ref 5–15)
BUN: 17 mg/dL (ref 6–20)
CO2: 25 mmol/L (ref 22–32)
Calcium: 8.6 mg/dL — ABNORMAL LOW (ref 8.9–10.3)
Chloride: 105 mmol/L (ref 98–111)
Creatinine, Ser: 0.57 mg/dL (ref 0.44–1.00)
GFR calc Af Amer: >60 mL/min (ref >60)
GFR calc non Af Amer: >60 mL/min (ref >60)
Glucose, Bld: 273 mg/dL — ABNORMAL HIGH (ref 70–99)
Potassium: 3.8 mmol/L (ref 3.5–5.1)
Sodium: 138 mmol/L (ref 135–145)
Total Bilirubin: 0.5 mg/dL (ref 0.3–1.2)
Total Protein: 6.1 g/dL — ABNORMAL LOW (ref 6.5–8.1)

## 2019-10-08 LAB — CBC WITH DIFFERENTIAL/PLATELET
Abs Immature Granulocytes: 0.11 10*3/uL — ABNORMAL HIGH (ref 0.00–0.07)
Basophils Absolute: 0 10*3/uL (ref 0.0–0.1)
Basophils Relative: 0 %
Eosinophils Absolute: 0 10*3/uL (ref 0.0–0.5)
Eosinophils Relative: 0 %
HCT: 39.8 % (ref 36.0–46.0)
Hemoglobin: 13.4 g/dL (ref 12.0–15.0)
Immature Granulocytes: 2 %
Lymphocytes Relative: 30 %
Lymphs Abs: 2.2 10*3/uL (ref 0.7–4.0)
MCH: 29.4 pg (ref 26.0–34.0)
MCHC: 33.7 g/dL (ref 30.0–36.0)
MCV: 87.3 fL (ref 80.0–100.0)
Monocytes Absolute: 0.6 10*3/uL (ref 0.1–1.0)
Monocytes Relative: 9 %
Neutro Abs: 4.3 10*3/uL (ref 1.7–7.7)
Neutrophils Relative %: 59 %
Platelets: 293 10*3/uL (ref 150–400)
RBC: 4.56 MIL/uL (ref 3.87–5.11)
RDW: 11.6 % (ref 11.5–15.5)
WBC: 7.3 10*3/uL (ref 4.0–10.5)
nRBC: 0 % (ref 0.0–0.2)

## 2019-10-08 LAB — C-REACTIVE PROTEIN: CRP: 1 mg/dL — ABNORMAL HIGH (ref <1.0)

## 2019-10-08 LAB — MAGNESIUM: Magnesium: 1.9 mg/dL (ref 1.7–2.4)

## 2019-10-08 LAB — FERRITIN: Ferritin: 304 ng/mL (ref 11–307)

## 2019-10-08 LAB — D-DIMER, QUANTITATIVE: D-Dimer, Quant: 1.09 ug/mL-FEU — ABNORMAL HIGH (ref 0.00–0.50)

## 2019-10-08 LAB — GLUCOSE, CAPILLARY
Glucose-Capillary: 260 mg/dL — ABNORMAL HIGH (ref 70–99)
Glucose-Capillary: 255 mg/dL — ABNORMAL HIGH (ref 70–99)
Glucose-Capillary: 342 mg/dL — ABNORMAL HIGH (ref 70–99)
Glucose-Capillary: 266 mg/dL — ABNORMAL HIGH (ref 70–99)

## 2019-10-08 MED ORDER — INSULIN DETEMIR 100 UNIT/ML ~~LOC~~ SOLN
30.0000 [IU] | Freq: Two times a day (BID) | SUBCUTANEOUS | Status: DC
  Administered 2019-10-08 – 2019-10-09 (×2): 30 [IU] via SUBCUTANEOUS
  Filled 2019-10-08 (×2): qty 0.3

## 2019-10-08 MED ORDER — INSULIN ASPART 100 UNIT/ML ~~LOC~~ SOLN
15.0000 [IU] | Freq: Three times a day (TID) | SUBCUTANEOUS | Status: DC
  Administered 2019-10-08 – 2019-10-09 (×3): 15 [IU] via SUBCUTANEOUS
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
## 2019-10-08 NOTE — Progress Notes (Signed)
Inpatient Diabetes Program Recommendations  AACE/ADA: New Consensus Statement on Inpatient Glycemic Control (2015)  Target Ranges:  Prepandial:   less than 140 mg/dL      Peak postprandial:   less than 180 mg/dL (1-2 hours)      Critically ill patients:  140 - 180 mg/dL   Lab Results  Component Value Date   GLUCAP 260 (H) 10/08/2019   HGBA1C 12.2 (H) 10/07/2019    Review of Glycemic Control  FBS and post-prandials > 180 mg/dL. Insulin titration needed.  Inpatient Diabetes Program Recommendations:    Increase Lantus to 30 units bid Increase Novolog to 15 units tidwc for meal cov   Continue to follow daily.  Thank you. Ailene Ards, RD, LDN, CDE Inpatient Diabetes Coordinator [phone]

## 2019-10-08 NOTE — Progress Notes (Signed)
Nocturnal cpap held due to positive covid status. 

## 2019-10-09 LAB — MAGNESIUM: Magnesium: 1.7 mg/dL (ref 1.7–2.4)

## 2019-10-09 LAB — CBC WITH DIFFERENTIAL/PLATELET
Abs Immature Granulocytes: 0.24 10*3/uL — ABNORMAL HIGH (ref 0.00–0.07)
Basophils Absolute: 0 10*3/uL (ref 0.0–0.1)
Basophils Relative: 0 %
Eosinophils Absolute: 0 10*3/uL (ref 0.0–0.5)
Eosinophils Relative: 0 %
HCT: 37.3 % (ref 36.0–46.0)
Hemoglobin: 12.6 g/dL (ref 12.0–15.0)
Immature Granulocytes: 3 %
Lymphocytes Relative: 30 %
Lymphs Abs: 2.9 10*3/uL (ref 0.7–4.0)
MCH: 29.6 pg (ref 26.0–34.0)
MCHC: 33.8 g/dL (ref 30.0–36.0)
MCV: 87.6 fL (ref 80.0–100.0)
Monocytes Absolute: 0.9 10*3/uL (ref 0.1–1.0)
Monocytes Relative: 9 %
Neutro Abs: 5.7 10*3/uL (ref 1.7–7.7)
Neutrophils Relative %: 58 %
Platelets: 293 10*3/uL (ref 150–400)
RBC: 4.26 MIL/uL (ref 3.87–5.11)
RDW: 11.7 % (ref 11.5–15.5)
WBC: 9.8 10*3/uL (ref 4.0–10.5)
nRBC: 0 % (ref 0.0–0.2)

## 2019-10-09 LAB — COMPREHENSIVE METABOLIC PANEL
ALT: 21 U/L (ref 0–44)
AST: 18 U/L (ref 15–41)
Albumin: 2.9 g/dL — ABNORMAL LOW (ref 3.5–5.0)
Alkaline Phosphatase: 72 U/L (ref 38–126)
Anion gap: 9 (ref 5–15)
BUN: 18 mg/dL (ref 6–20)
CO2: 23 mmol/L (ref 22–32)
Calcium: 8.3 mg/dL — ABNORMAL LOW (ref 8.9–10.3)
Chloride: 101 mmol/L (ref 98–111)
Creatinine, Ser: 0.58 mg/dL (ref 0.44–1.00)
GFR calc Af Amer: >60 mL/min (ref >60)
GFR calc non Af Amer: >60 mL/min (ref >60)
Glucose, Bld: 332 mg/dL — ABNORMAL HIGH (ref 70–99)
Potassium: 3.4 mmol/L — ABNORMAL LOW (ref 3.5–5.1)
Sodium: 133 mmol/L — ABNORMAL LOW (ref 135–145)
Total Bilirubin: 0.6 mg/dL (ref 0.3–1.2)
Total Protein: 5.8 g/dL — ABNORMAL LOW (ref 6.5–8.1)

## 2019-10-09 LAB — GLUCOSE, CAPILLARY: Glucose-Capillary: 289 mg/dL — ABNORMAL HIGH (ref 70–99)

## 2019-10-09 LAB — FERRITIN: Ferritin: 292 ng/mL (ref 11–307)

## 2019-10-09 LAB — C-REACTIVE PROTEIN: CRP: 0.8 mg/dL (ref <1.0)

## 2019-10-09 LAB — D-DIMER, QUANTITATIVE: D-Dimer, Quant: 1.11 ug/mL-FEU — ABNORMAL HIGH (ref 0.00–0.50)

## 2019-10-09 MED ORDER — LISINOPRIL 20 MG PO TABS: 20.0000 mg | ORAL_TABLET | Freq: Every day | ORAL | 2 refills | Status: DC

## 2019-10-09 MED ORDER — DEXAMETHASONE 6 MG PO TABS
6.0000 mg | ORAL_TABLET | Freq: Every day | ORAL | 0 refills | Status: DC
Start: 2019-10-09 — End: 2019-10-27

## 2019-10-09 MED ORDER — GUAIFENESIN-DM 100-10 MG/5ML PO SYRP: 5.0000 mL | ORAL_SOLUTION | ORAL | 0 refills | Status: DC | PRN

## 2019-10-09 MED ORDER — CETIRIZINE HCL 10 MG PO CAPS: 10.0000 mg | ORAL_CAPSULE | Freq: Every day | ORAL | 0 refills | Status: DC

## 2019-10-09 MED ORDER — ACETAMINOPHEN 500 MG PO TABS
500.0000 mg | ORAL_TABLET | Freq: Four times a day (QID) | ORAL | 0 refills | Status: DC | PRN
Start: 2019-10-09 — End: 2020-06-13

## 2019-10-09 MED ORDER — PANTOPRAZOLE SODIUM 40 MG PO TBEC: 40.0000 mg | DELAYED_RELEASE_TABLET | Freq: Every day | ORAL | 0 refills | Status: DC

## 2019-10-09 MED ORDER — METHOCARBAMOL 500 MG PO TABS
500.0000 mg | ORAL_TABLET | Freq: Two times a day (BID) | ORAL | 0 refills | Status: DC
Start: 2019-10-09 — End: 2019-11-23
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
## 2019-10-10 LAB — CULTURE, BLOOD (ROUTINE X 2)
Culture: NO GROWTH
Culture: NO GROWTH

## 2019-10-16 ENCOUNTER — Encounter (HOSPITAL_COMMUNITY): Payer: Self-pay

## 2019-10-16 ENCOUNTER — Emergency Department (HOSPITAL_COMMUNITY)
Admission: EM | Admit: 2019-10-16 | Discharge: 2019-10-16 | Disposition: A | Discharge status: Home / Self Care | Payer: Medicaid Other | Attending: Emergency Medicine | Admitting: Emergency Medicine

## 2019-10-16 ENCOUNTER — Emergency Department (HOSPITAL_COMMUNITY): Payer: Medicaid Other

## 2019-10-16 ENCOUNTER — Other Ambulatory Visit: Payer: Self-pay

## 2019-10-16 ENCOUNTER — Ambulatory Visit (HOSPITAL_COMMUNITY): Admission: EM | Admit: 2019-10-16 | Discharge: 2019-10-16 | Payer: Medicaid Other

## 2019-10-16 DIAGNOSIS — J45909 Unspecified asthma, uncomplicated: Secondary | ICD-10-CM | POA: Insufficient documentation

## 2019-10-16 DIAGNOSIS — E119 Type 2 diabetes mellitus without complications: Secondary | ICD-10-CM | POA: Diagnosis not present

## 2019-10-16 DIAGNOSIS — Z794 Long term (current) use of insulin: Secondary | ICD-10-CM | POA: Diagnosis not present

## 2019-10-16 DIAGNOSIS — I10 Essential (primary) hypertension: Secondary | ICD-10-CM | POA: Insufficient documentation

## 2019-10-16 DIAGNOSIS — Z79899 Other long term (current) drug therapy: Secondary | ICD-10-CM | POA: Diagnosis not present

## 2019-10-16 DIAGNOSIS — Z87891 Personal history of nicotine dependence: Secondary | ICD-10-CM | POA: Insufficient documentation

## 2019-10-16 DIAGNOSIS — R479 Unspecified speech disturbances: Secondary | ICD-10-CM | POA: Diagnosis not present

## 2019-10-16 DIAGNOSIS — R4701 Aphasia: Secondary | ICD-10-CM | POA: Diagnosis present

## 2019-10-16 LAB — COMPREHENSIVE METABOLIC PANEL
ALT: 47 U/L — ABNORMAL HIGH (ref 0–44)
AST: 20 U/L (ref 15–41)
Albumin: 3.8 g/dL (ref 3.5–5.0)
Alkaline Phosphatase: 100 U/L (ref 38–126)
Anion gap: 11 (ref 5–15)
BUN: 15 mg/dL (ref 6–20)
CO2: 22 mmol/L (ref 22–32)
Calcium: 8.8 mg/dL — ABNORMAL LOW (ref 8.9–10.3)
Chloride: 99 mmol/L (ref 98–111)
Creatinine, Ser: 0.8 mg/dL (ref 0.44–1.00)
GFR calc Af Amer: >60 mL/min (ref >60)
GFR calc non Af Amer: >60 mL/min (ref >60)
Glucose, Bld: 320 mg/dL — ABNORMAL HIGH (ref 70–99)
Potassium: 4.8 mmol/L (ref 3.5–5.1)
Sodium: 132 mmol/L — ABNORMAL LOW (ref 135–145)
Total Bilirubin: 1.7 mg/dL — ABNORMAL HIGH (ref 0.3–1.2)
Total Protein: 7 g/dL (ref 6.5–8.1)

## 2019-10-16 LAB — CBC WITH DIFFERENTIAL/PLATELET
Abs Immature Granulocytes: 0.03 10*3/uL (ref 0.00–0.07)
Basophils Absolute: 0 10*3/uL (ref 0.0–0.1)
Basophils Relative: 1 %
Eosinophils Absolute: 0 10*3/uL (ref 0.0–0.5)
Eosinophils Relative: 0 %
HCT: 42.6 % (ref 36.0–46.0)
Hemoglobin: 14.5 g/dL (ref 12.0–15.0)
Immature Granulocytes: 0 %
Lymphocytes Relative: 35 %
Lymphs Abs: 2.8 10*3/uL (ref 0.7–4.0)
MCH: 30.1 pg (ref 26.0–34.0)
MCHC: 34 g/dL (ref 30.0–36.0)
MCV: 88.4 fL (ref 80.0–100.0)
Monocytes Absolute: 0.6 10*3/uL (ref 0.1–1.0)
Monocytes Relative: 8 %
Neutro Abs: 4.5 10*3/uL (ref 1.7–7.7)
Neutrophils Relative %: 56 %
Platelets: 353 10*3/uL (ref 150–400)
RBC: 4.82 MIL/uL (ref 3.87–5.11)
RDW: 12 % (ref 11.5–15.5)
WBC: 8.1 10*3/uL (ref 4.0–10.5)
nRBC: 0 % (ref 0.0–0.2)

## 2019-10-16 MED ORDER — SODIUM CHLORIDE 0.9 % IV SOLN: INTRAVENOUS | Status: DC

## 2019-10-16 NOTE — Discharge Instructions (Signed)
Follow-up with neurology 

## 2019-10-16 NOTE — ED Notes (Signed)
Patient transported to MRI 

## 2019-10-16 NOTE — ED Notes (Signed)
Per MD, ok to obtain blood and IV after MRI so as not to delay imaging.

## 2019-10-16 NOTE — ED Notes (Signed)
Patient is being discharged from the Urgent Care Center and sent to the Emergency Department via personal vehicle . Per PCP/Dr Delton See, patient is stable but in need of higher level of care due to post covid symptoms. Patient is aware and verbalizes understanding of plan of care. There were no vitals filed for this visit.

## 2019-10-16 NOTE — ED Provider Notes (Signed)
[facility]-EMERGENCY DEPT Provider Note   CSN: [id number] Arrival date & time: 10/16/19  1007     History Chief Complaint  Patient presents with  . Medication Reaction    Sarah Garrison is a 39 y.o. female.  39 year old female presents with trouble getting her words out times several days.  Was admitted to the hospital for COVID-19 symptoms and discharged about a week ago.  States that while she was in the hospital she started having what sounds like expressive aphasia.  Denies any headache.  No severe weakness to her upper or lower extremities.  No trouble ambulating.  Patient states that she informed her treatment team of the symptoms while she was admitted but no treatment was performed.  States that the symptoms have been persistent and do not seem to wax and wane and that she is not taking anything specific for them.  Patient was placed on Decadron at time of discharge.        Past Medical History:  Diagnosis Date  . Asthma   . COVID-19   . Diabetes mellitus without complication (HCC)   . GERD (gastroesophageal reflux disease)   . Hypertension     Patient Active Problem List   Diagnosis Date Noted  . COVID-19 10/05/2019  . Hypertension 10/05/2019  . SOB (shortness of breath)   . Hyperglycemia   . OSA (obstructive sleep apnea) 02/16/2019  . 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 Infective otitis externa of left ear 01/01/2019  . Essential hypertension 06/22/2018  . Mixed hyperlipidemia 06/22/2018  . Uncontrolled type 2 diabetes mellitus with complication, with long-term current use of insulin (HCC) 12/13/2016  . Non-adherence to medical treatment 12/13/2016  . Asthma 07/30/2016  . 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/07/2016  . Encounter for pregnancy test 02/01/2016  . Leg swelling 09/29/2015  . Morbid obesity, unspecified obesity type (HCC) 09/29/2015  . DM neuropathy, type II diabetes mellitus (HCC) 08/24/2015    Past Surgical History:  Procedure Laterality  Date  . CESAREAN SECTION    . HERNIA REPAIR    . TUBAL LIGATION    . VENTRAL HERNIA REPAIR N/A 11/27/2017   Procedure: VENTRAL HERNIA REPAIR ERAS PATHWAY;  Surgeon: Harriette Bouillon, MD;  Location: [facility];  Service: General;  Laterality: N/A;  . WISDOM TOOTH EXTRACTION       OB History    Gravida  5   Para  4   Term      Preterm      AB  1   Living  4     SAB  1   TAB      Ectopic      Multiple      Live Births              Family History  Problem Relation Age of Onset  . Asthma Mother   . Kidney failure Mother   . Brain cancer Mother   . Asthma Father   . Other Father        surgery on stomach but don't know from what  . Diabetes Brother   . Diabetes Maternal Grandmother     Social History   Tobacco Use  . Smoking status: Former Smoker    Packs/day: 0.10    Types: Cigarettes    Quit date: 11/27/2017    Years since quitting: 1.8  . Smokeless tobacco: Never Used  Substance Use Topics  . Alcohol use: No  . Drug use: No  Home Medications Prior to Admission medications   Medication Sig Start Date End Date Taking? Authorizing Provider  acetaminophen (TYLENOL) 500 MG tablet Take 1 tablet (500 mg total) by mouth every 6 (six) hours as needed. 10/09/19   Pokhrel, Rebekah Chesterfield, MD  albuterol (VENTOLIN HFA) 108 (90 Base) MCG/ACT inhaler Inhale 1-2 puffs into the lungs every 6 (six) hours as needed for wheezing or shortness of breath. 01/01/19   Storm Frisk, MD  Cetirizine HCl 10 MG CAPS Take 1 capsule (10 mg total) by mouth daily. 10/09/19 11/08/19  Pokhrel, Rebekah Chesterfield, MD  cyclobenzaprine (FLEXERIL) 10 MG tablet Take 1 tablet (10 mg total) by mouth 3 (three) times daily as needed for muscle spasms. May take 1/2 tab if causes excessive drowsiness 10/02/19   Claiborne Rigg, NP  dexamethasone (DECADRON) 6 MG tablet Take 1 tablet (6 mg total) by mouth daily. 10/09/19   Pokhrel, Rebekah Chesterfield, MD  diazepam (VALIUM) 2 MG tablet Take 2.5 tablets (5 mg total) by  mouth 2 (two) times daily. 09/29/19   Maxwell Caul, PA-C  fluticasone (FLONASE) 50 MCG/ACT nasal spray Place 1-2 sprays into both nostrils daily for 7 days. Patient not taking: Reported on 10/05/2019 10/02/19 10/09/19  Claiborne Rigg, NP  guaiFENesin-dextromethorphan (ROBITUSSIN DM) 100-10 MG/5ML syrup Take 5 mLs by mouth every 4 (four) hours as needed for cough (chest congestion). 10/09/19   Pokhrel, Rebekah Chesterfield, MD  ibuprofen (ADVIL) 200 MG tablet Take 400 mg by mouth every 6 (six) hours as needed for headache or mild pain.    [provider]  Insulin Human (INSULIN PUMP) SOLN Inject 1 each into the skin See admin instructions. Medication: Novolog 100 units/ml injection. Takes 100 units via pump per patient.    [provider]  lisinopril (ZESTRIL) 20 MG tablet Take 1 tablet (20 mg total) by mouth daily. 10/09/19   Pokhrel, Rebekah Chesterfield, MD  methocarbamol (ROBAXIN) 500 MG tablet Take 1 tablet (500 mg total) by mouth 2 (two) times daily. 10/09/19   Pokhrel, Rebekah Chesterfield, MD  Misc. Devices MISC CPAP therapy on autopap 5-15.  Needs Small size Fisher&Paykel Full Face Mask Simplus  mask and heated humidification. 09/13/19   Claiborne Rigg, NP  pantoprazole (PROTONIX) 40 MG tablet Take 1 tablet (40 mg total) by mouth daily. 10/10/19   Pokhrel, Rebekah Chesterfield, MD  traMADol (ULTRAM) 50 MG tablet Take 1 tablet (50 mg total) by mouth every 6 (six) hours as needed for severe pain. Patient not taking: Reported on 10/05/2019 09/18/19   Long, Arlyss Repress, MD  TRUEPLUS PEN NEEDLES 32G X 4 MM MISC USE 3 TIMES DAILY AS DIRECTED 06/16/18   Hoy Register, MD  VITAMIN D PO Take 1 tablet by mouth 2 (two) times daily.    [provider]    Allergies    Morphine and related, Glyburide, Ivp dye [iodinated diagnostic agents], Oxycodone, and Vicodin [hydrocodone-acetaminophen]  Review of Systems   Review of Systems  All other systems reviewed and are negative.   Physical Exam Updated Vital Signs BP (!) 150/116 (BP  Location: Left Arm)   Pulse (!) 123   Temp 98.1 F (36.7 C) (Oral)   Resp 19   Ht 1.626 m (5\' 4" )   Wt 104.3 kg   SpO2 97%   BMI 39.47 kg/m   Physical Exam Vitals and nursing note reviewed.  Constitutional:      General: She is not in acute distress.    Appearance: Normal appearance. She is well-developed. She is not toxic-appearing.  HENT:     Head: Normocephalic and atraumatic.  Eyes:     General: Lids are normal.     Conjunctiva/sclera: Conjunctivae normal.     Pupils: Pupils are equal, round, and reactive to light.  Neck:     Thyroid: No thyroid mass.     Trachea: No tracheal deviation.  Cardiovascular:     Rate and Rhythm: Normal rate and regular rhythm.     Heart sounds: Normal heart sounds. No murmur. No gallop.   Pulmonary:     Effort: Pulmonary effort is normal. No respiratory distress.     Breath sounds: Normal breath sounds. No stridor. No decreased breath sounds, wheezing, rhonchi or rales.  Abdominal:     General: Bowel sounds are normal. There is no distension.     Palpations: Abdomen is soft.     Tenderness: There is no abdominal tenderness. There is no rebound.  Musculoskeletal:        General: No tenderness. Normal range of motion.     Cervical back: Normal range of motion and neck supple.  Skin:    General: Skin is warm and dry.     Findings: No abrasion or rash.  Neurological:     Mental Status: She is alert and oriented to person, place, and time.     GCS: GCS eye subscore is 4. GCS verbal subscore is 5. GCS motor subscore is 6.     Cranial Nerves: Dysarthria present. No cranial nerve deficit.     Sensory: No sensory deficit.     Motor: No weakness or tremor.     Gait: Gait normal.  Psychiatric:        Speech: Speech normal.        Behavior: Behavior normal.     ED Results / Procedures / Treatments   Labs (all labs ordered are listed, but only abnormal results are displayed) Labs Reviewed  CBC WITH DIFFERENTIAL/PLATELET  COMPREHENSIVE  METABOLIC PANEL    EKG None  Radiology No results found.  Procedures Procedures (including critical care time)  Medications Ordered in ED Medications - No data to display  ED Course  I have reviewed the triage vital signs and the nursing notes.  Pertinent labs & imaging results that were available during my care of the patient were reviewed by me and considered in my medical decision making (see chart for details).    MDM Rules/Calculators/A&P                      CBC and BMP within normal limits.  MRI brain negative.  Case discussed with Dr. Leonel Ramsay from neurology who states that her symptoms could be related to Covid sequela and recommends follow-up with neurology.  Will give neurological referral Final Clinical Impression(s) / ED Diagnoses Final diagnoses:  None    Rx / DC Orders ED Discharge Orders    None       Lacretia Leigh, MD 10/16/19 1343

## 2019-10-16 NOTE — ED Triage Notes (Signed)
Patient states she was discharged from the hospital a week ago from covid and pneumonia. Patient states that she has been stuttering since taking her new medications which include Cetirizine, zofran, dexamethasone, and Benzonatate.

## 2019-10-23 ENCOUNTER — Ambulatory Visit: Payer: Medicaid Other | Attending: Family Medicine | Admitting: Family Medicine

## 2019-10-23 ENCOUNTER — Other Ambulatory Visit: Payer: Self-pay

## 2019-10-23 ENCOUNTER — Encounter: Payer: Self-pay | Admitting: Family Medicine

## 2019-10-23 DIAGNOSIS — Z9989 Dependence on other enabling machines and devices: Secondary | ICD-10-CM

## 2019-10-23 DIAGNOSIS — K5900 Constipation, unspecified: Secondary | ICD-10-CM

## 2019-10-23 DIAGNOSIS — I10 Essential (primary) hypertension: Secondary | ICD-10-CM

## 2019-10-23 DIAGNOSIS — K219 Gastro-esophageal reflux disease without esophagitis: Secondary | ICD-10-CM

## 2019-10-23 DIAGNOSIS — R471 Dysarthria and anarthria: Secondary | ICD-10-CM | POA: Diagnosis not present

## 2019-10-23 DIAGNOSIS — Z09 Encounter for follow-up examination after completed treatment for conditions other than malignant neoplasm: Secondary | ICD-10-CM | POA: Diagnosis not present

## 2019-10-23 DIAGNOSIS — E1165 Type 2 diabetes mellitus with hyperglycemia: Secondary | ICD-10-CM | POA: Diagnosis not present

## 2019-10-23 DIAGNOSIS — B370 Candidal stomatitis: Secondary | ICD-10-CM

## 2019-10-23 DIAGNOSIS — G4733 Obstructive sleep apnea (adult) (pediatric): Secondary | ICD-10-CM

## 2019-10-23 DIAGNOSIS — Z794 Long term (current) use of insulin: Secondary | ICD-10-CM

## 2019-10-23 DIAGNOSIS — U071 COVID-19: Secondary | ICD-10-CM | POA: Diagnosis not present

## 2019-10-23 DIAGNOSIS — J1282 Pneumonia due to coronavirus disease 2019: Secondary | ICD-10-CM

## 2019-10-23 MED ORDER — LACTULOSE 20 GM/30ML PO SOLN: 30.0000 mL | Freq: Every day | ORAL | 0 refills | Status: DC

## 2019-10-23 MED ORDER — FLUCONAZOLE 100 MG PO TABS: 100.0000 mg | ORAL_TABLET | Freq: Every day | ORAL | 0 refills | Status: DC

## 2019-10-23 MED ORDER — PANTOPRAZOLE SODIUM 40 MG PO TBEC: 40.0000 mg | DELAYED_RELEASE_TABLET | Freq: Every day | ORAL | 0 refills | Status: DC

## 2019-10-23 MED ORDER — LISINOPRIL 20 MG PO TABS: 20.0000 mg | ORAL_TABLET | Freq: Every day | ORAL | 2 refills | Status: DC
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
## 2019-10-26 ENCOUNTER — Ambulatory Visit: Payer: Medicaid Other | Admitting: Family

## 2019-10-27 ENCOUNTER — Encounter: Payer: Self-pay | Admitting: Neurology

## 2019-10-27 ENCOUNTER — Other Ambulatory Visit: Payer: Self-pay

## 2019-10-27 ENCOUNTER — Ambulatory Visit: Payer: Medicaid Other | Admitting: Neurology

## 2019-10-27 VITALS — BP 138/88 | HR 105 | Ht 64.0 in | Wt 234.0 lb

## 2019-10-27 DIAGNOSIS — F418 Other specified anxiety disorders: Secondary | ICD-10-CM

## 2019-10-27 DIAGNOSIS — Z8616 Personal history of COVID-19: Secondary | ICD-10-CM | POA: Diagnosis not present

## 2019-10-27 DIAGNOSIS — F8081 Childhood onset fluency disorder: Secondary | ICD-10-CM

## 2019-10-27 DIAGNOSIS — R0602 Shortness of breath: Secondary | ICD-10-CM

## 2019-10-27 MED ORDER — ARIPIPRAZOLE 5 MG PO TABS
5.0000 mg | ORAL_TABLET | Freq: Every day | ORAL | 2 refills | Status: DC
Start: 2019-10-27 — End: 2020-06-13

## 2019-10-27 NOTE — Progress Notes (Signed)
[facility]  PATIENT: Sarah RODRIGES DOB: 16-Mar-1981  REFERRING DOCTOR OR PCP:  Referred by ED.   PCP is Dr. Chapman Fitch SOURCE: patient, notes from recent admission and ED visits, MRI and lab reports, MRI images oersonally reviewed.    _________________________________   HISTORICAL  CHIEF COMPLAINT:  Chief Complaint  Patient presents with   New Patient (Initial Visit)    RM 14, alone. ED referral for dysathria. Seen at [facility] 10/15/19. She is SOB while talking. This all started when she was admitted the first time on 10/05/19 with covid-19/pneumonia.     HISTORY OF PRESENT ILLNESS:  I had the pleasure seeing your patient, Sarah Garrison, at [facility] for neurologic consultation regarding her speech impediment following recent COVID-19.  She is a 39 year old woman who had Covid-19 10/05/19.   She had pneumonia and went to the ICU.   She did not require ventilation but did get O2.   Since the hospital stay, she has had a raspy voice and has had some stuttering.     She notes shortness of breath at times when she has more difficulty with speech.   She went back to the ED due to these symptoms.    She had an MRI of the brain.   I reviewed the images and the brain is normal.    Her case was reviewed with the Neuro-hospitalist who recommended an outpatient neurologic follow-up.   She has no dysphagia.   She mostly eats soft foods since more solid food causes stomachache and bowel changes.    She reports still being short of breath.   She notes the speech issues seem worse when she is more short of breath.    She notes being less able to walk longer distances due to shortness of breath.   Her Ventolin inhaler for asthma does not help her shortness of breath.  She was on decadron after d/c for Covid-19 but is no longer on it.    She os also sleeping worse since these symptoms started.   She wakes up multiple times a night.      She is feeling more depressed since  he her symptoms started and became tearful talking about missing activities with her kids.     She has asthma, IDDM (Type 2), and OSA.       REVIEW OF SYSTEMS: Constitutional: No fevers, chills, sweats, or change in appetite Eyes: No visual changes, double vision, eye pain Ear, nose and throat: No hearing loss, ear pain, nasal congestion, sore throat Cardiovascular: No chest pain, palpitations Respiratory: No shortness of breath at rest or with exertion.   No wheezes GastrointestinaI: No nausea, vomiting, diarrhea, abdominal pain, fecal incontinence Genitourinary: No dysuria, urinary retention or frequency.  No nocturia. Musculoskeletal: No neck pain, back pain Integumentary: No rash, pruritus, skin lesions Neurological: as above Psychiatric: No depression at this time.  No anxiety Endocrine: No palpitations, diaphoresis, change in appetite, change in weigh or increased thirst Hematologic/Lymphatic: No anemia, purpura, petechiae. Allergic/Immunologic: No itchy/runny eyes, nasal congestion, recent allergic reactions, rashes  ALLERGIES: Allergies  Allergen Reactions   Morphine And Related Shortness Of Breath   Glyburide Diarrhea and Other (See Comments)    Reaction:  Nose bleeds    Ivp Dye [Iodinated Diagnostic Agents] Other (See Comments)    Shortness of breath.     Oxycodone Other (See Comments)    Pt states that this medication makes her "dream about rabbits chasing" her.  Vicodin [Hydrocodone-Acetaminophen] Other (See Comments)    Pt states that this medication makes her "dream about rabbits chasing" her.      HOME MEDICATIONS:  Current Outpatient Medications:    acetaminophen (TYLENOL) 500 MG tablet, Take 1 tablet (500 mg total) by mouth every 6 (six) hours as needed., Disp: 30 tablet, Rfl: 0   albuterol (VENTOLIN HFA) 108 (90 Base) MCG/ACT inhaler, Inhale 1-2 puffs into the lungs every 6 (six) hours as needed for wheezing or shortness of breath., Disp: 18 g,  Rfl: 1   Cetirizine HCl 10 MG CAPS, Take 1 capsule (10 mg total) by mouth daily., Disp: 30 capsule, Rfl: 0   cyclobenzaprine (FLEXERIL) 10 MG tablet, Take 1 tablet (10 mg total) by mouth 3 (three) times daily as needed for muscle spasms. May take 1/2 tab if causes excessive drowsiness, Disp: 60 tablet, Rfl: 0   diazepam (VALIUM) 2 MG tablet, Take 2.5 tablets (5 mg total) by mouth 2 (two) times daily., Disp: 12 tablet, Rfl: 0   fluconazole (DIFLUCAN) 100 MG tablet, Take 1 tablet (100 mg total) by mouth daily., Disp: 7 tablet, Rfl: 0   guaiFENesin-dextromethorphan (ROBITUSSIN DM) 100-10 MG/5ML syrup, Take 5 mLs by mouth every 4 (four) hours as needed for cough (chest congestion)., Disp: 118 mL, Rfl: 0   ibuprofen (ADVIL) 200 MG tablet, Take 400 mg by mouth every 6 (six) hours as needed for headache or mild pain., Disp: , Rfl:    Insulin Human (INSULIN PUMP) SOLN, Inject 1 each into the skin See admin instructions. Medication: Novolog 100 units/ml injection. Takes 100 units via pump per patient., Disp: , Rfl:    Lactulose 20 GM/30ML SOLN, Take 30 mLs (20 g total) by mouth at bedtime. For 3 nights then as needed for constipation, Disp: 450 mL, Rfl: 0   lisinopril (ZESTRIL) 20 MG tablet, Take 1 tablet (20 mg total) by mouth daily., Disp: 30 tablet, Rfl: 2   methocarbamol (ROBAXIN) 500 MG tablet, Take 1 tablet (500 mg total) by mouth 2 (two) times daily., Disp: 20 tablet, Rfl: 0   Misc. Devices MISC, CPAP therapy on autopap 5-15.  Needs Small size [employer]kel Full Face Mask Simplus  mask and heated humidification., Disp: 1 each, Rfl: 0   pantoprazole (PROTONIX) 40 MG tablet, Take 1 tablet (40 mg total) by mouth daily., Disp: 15 tablet, Rfl: 0   traMADol (ULTRAM) 50 MG tablet, Take 1 tablet (50 mg total) by mouth every 6 (six) hours as needed for severe pain., Disp: 10 tablet, Rfl: 0   TRUEPLUS PEN NEEDLES 32G X 4 MM MISC, USE 3 TIMES DAILY AS DIRECTED, Disp: 100 each, Rfl: 5   VITAMIN D PO,  Take 1 tablet by mouth 2 (two) times daily., Disp: , Rfl:    ARIPiprazole (ABILIFY) 5 MG tablet, Take 1 tablet (5 mg total) by mouth daily., Disp: 30 tablet, Rfl: 2   fluticasone (FLONASE) 50 MCG/ACT nasal spray, Place 1-2 sprays into both nostrils daily for 7 days. (Patient not taking: Reported on 10/05/2019), Disp: 1 g, Rfl: 0  PAST MEDICAL HISTORY: Past Medical History:  Diagnosis Date   Asthma    COVID-19    Diabetes mellitus without complication (HCC)    GERD (gastroesophageal reflux disease)    Hypertension     PAST SURGICAL HISTORY: Past Surgical History:  Procedure Laterality Date   CESAREAN SECTION     HERNIA REPAIR     TUBAL LIGATION     VENTRAL HERNIA REPAIR N/A 11/27/2017  Procedure: VENTRAL HERNIA REPAIR ERAS PATHWAY;  Surgeon: Harriette Bouillon, MD;  Location: [facility];  Service: General;  Laterality: N/A;   WISDOM TOOTH EXTRACTION      FAMILY HISTORY: Family History  Problem Relation Age of Onset   Asthma Mother    Kidney failure Mother    Brain cancer Mother    Asthma Father    Other Father        surgery on stomach but don't know from what   Diabetes Brother    Diabetes Maternal Grandmother     SOCIAL HISTORY:  Social History   Socioeconomic History   Marital status: Single    Spouse name: Not on file   Number of children: 4   Years of education: Not on file   Highest education level: Not on file  Occupational History   Not on file  Tobacco Use   Smoking status: Former Smoker    Packs/day: 0.10    Types: Cigarettes    Quit date: 11/27/2017    Years since quitting: 1.9   Smokeless tobacco: Never Used  Substance and Sexual Activity   Alcohol use: No   Drug use: No   Sexual activity: Yes    Birth control/protection: Surgical  Other Topics Concern   Not on file  Social History Narrative   Right handed   No caffeine use   Diet coke sometimes    Social Determinants of Research scientist (physical sciences) Strain:    Difficulty of Paying Living Expenses:   Food Insecurity:    Worried About Programme researcher, broadcasting/film/video in the Last Year:    Barista in the Last Year:   Transportation Needs:    Freight forwarder (Medical):    Lack of Transportation (Non-Medical):   Physical Activity:    Days of Exercise per Week:    Minutes of Exercise per Session:   Stress:    Feeling of Stress :   Social Connections:    Frequency of Communication with Friends and Family:    Frequency of Social Gatherings with Friends and Family:    Attends Religious Services:    Active Member of Clubs or Organizations:    Attends Banker Meetings:    Marital Status:   Intimate Partner Violence:    Fear of Current or Ex-Partner:    Emotionally Abused:    Physically Abused:    Sexually Abused:      PHYSICAL EXAM  Vitals:   10/27/19 1322  BP: 138/88  Pulse: (!) 105  Weight: 234 lb (106.1 kg)  Height: 5\' 4"  (1.626 m)    Body mass index is 40.17 kg/m.   General: The patient is well-developed and well-nourished and in no acute distress  HEENT:  Head is [place]/AT.  Sclera are anicteric.  Funduscopic exam shows normal optic discs and retinal vessels.  Neck: No carotid bruits are noted.  The neck is nontender.  Cardiovascular: The heart has a regular rate and rhythm with a normal S1 and S2. There were no murmurs, gallops or rubs.    Skin: Extremities are without rash or  edema.  Musculoskeletal:  Back is nontender  Neurologic Exam  Mental status: The patient is alert and oriented x 3 at the time of the examination. The patient has apparent normal recent and remote memory, with an apparently normal attention span and concentration ability.     Speech: She has a fluctuating stutter/stammer.  At times she will talk  several sentences with no difficulties but then have repeated syllables.  Content of speech appear normal.  Soft palate movements were normal.  Cranial  nerves: Extraocular movements are full. Pupils are equal, round, and reactive to light and accomodation.  Visual fields are full.  Facial symmetry is present. There is good facial sensation to soft touch bilaterally.Facial strength is normal.  Trapezius and sternocleidomastoid strength is normal. No dysarthria is noted.  The tongue is midline, and the patient has symmetric elevation of the soft palate. No obvious hearing deficits are noted.  Motor:  Muscle bulk is normal.   Tone is normal. Strength is  5 / 5 in all 4 extremities.   Sensory: Sensory testing is intact to pinprick, soft touch and vibration sensation in all 4 extremities.  Coordination: Cerebellar testing reveals good finger-nose-finger and heel-to-shin bilaterally.  Gait and station: Station is normal.   Gait is normal. Tandem gait is normal. Romberg is negative.   Reflexes: Deep tendon reflexes are symmetric and normal bilaterally.        DIAGNOSTIC DATA (LABS, IMAGING, TESTING) - I reviewed patient records, labs, notes, testing and imaging myself where available.  Lab Results  Component Value Date   WBC 8.1 10/16/2019   HGB 14.5 10/16/2019   HCT 42.6 10/16/2019   MCV 88.4 10/16/2019   PLT 353 10/16/2019      Component Value Date/Time   NA 132 (L) 10/16/2019 1058   NA 138 10/16/2017 1025   K 4.8 10/16/2019 1058   CL 99 10/16/2019 1058   CO2 22 10/16/2019 1058   GLUCOSE 320 (H) 10/16/2019 1058   BUN 15 10/16/2019 1058   BUN 11 10/16/2017 1025   CREATININE 0.80 10/16/2019 1058   CALCIUM 8.8 (L) 10/16/2019 1058   PROT 7.0 10/16/2019 1058   PROT 6.6 10/16/2017 1025   ALBUMIN 3.8 10/16/2019 1058   ALBUMIN 4.4 10/16/2017 1025   AST 20 10/16/2019 1058   ALT 47 (H) 10/16/2019 1058   ALKPHOS 100 10/16/2019 1058   BILITOT 1.7 (H) 10/16/2019 1058   BILITOT 0.3 10/16/2017 1025   GFRNONAA >60 10/16/2019 1058   GFRAA >60 10/16/2019 1058   Lab Results  Component Value Date   CHOL 300 (H) 03/03/2018   HDL 46.30  03/03/2018   LDLCALC 216 (H) 03/03/2018   TRIG 246 (H) 10/05/2019   CHOLHDL 6 03/03/2018   Lab Results  Component Value Date   HGBA1C 12.2 (H) 10/07/2019   No results found for: VITAMINB12 Lab Results  Component Value Date   TSH 2.000 08/14/2017       ASSESSMENT AND PLAN  Stuttering  History of COVID-19 - Plan: Ambulatory referral to Pulmonology  Shortness of breath - Plan: Ambulatory referral to Pulmonology  Depression with anxiety   In summary, Ms. Jon BillingsMorrison is a 39 year old woman who has had shortness of breath and stuttering since COVID-19 infection several weeks ago.  On exam, the stuttering fluctuated quite a bit with stretches of normal speech interrupted by various degrees of difficult speech.  The etiology is uncertain.  I cannot rule out that this is a post Covid-19 process but this could also be due to shortness of breath combined with anxiety.  Some patients with stuttering have fewer symptoms on D2 antagonist such as Abilify and I will call this in for her to see if she gets a benefit.  This will also help the mood some.  Besides the shortness of breath, she notes that her exercise tolerance is much  worse.  She has a history of asthma.  We will also refer to pulmonology to see if further testing or treatment may be of benefit.  A follow-up was not scheduled but she could call back if she has new or worsening neurologic symptoms.   Thank you for asking me to see Ms. Jon Billings.  Please let him know further assistance with her or other patients in the future.   Crosby Bevan A. Epimenio Foot, MD, [facility] 10/27/2019, 2:22 PM Certified in Neurology, Clinical Neurophysiology, Sleep Medicine and Neuroimaging  [facility] [address] [place], [place] [address] [phone]

## 2019-10-29 ENCOUNTER — Ambulatory Visit: Payer: Medicaid Other

## 2019-11-23 ENCOUNTER — Other Ambulatory Visit: Payer: Self-pay

## 2019-11-23 ENCOUNTER — Ambulatory Visit: Payer: Medicaid Other | Attending: Family Medicine | Admitting: *Deleted

## 2019-11-23 ENCOUNTER — Ambulatory Visit (HOSPITAL_BASED_OUTPATIENT_CLINIC_OR_DEPARTMENT_OTHER): Payer: Medicaid Other | Admitting: Critical Care Medicine

## 2019-11-23 ENCOUNTER — Encounter: Payer: Self-pay | Admitting: Critical Care Medicine

## 2019-11-23 VITALS — BP 126/92 | HR 107 | Temp 98.2°F | Resp 18 | Ht 65.0 in | Wt 234.0 lb

## 2019-11-23 DIAGNOSIS — Z79899 Other long term (current) drug therapy: Secondary | ICD-10-CM | POA: Diagnosis not present

## 2019-11-23 DIAGNOSIS — Z87891 Personal history of nicotine dependence: Secondary | ICD-10-CM | POA: Insufficient documentation

## 2019-11-23 DIAGNOSIS — K219 Gastro-esophageal reflux disease without esophagitis: Secondary | ICD-10-CM | POA: Diagnosis not present

## 2019-11-23 DIAGNOSIS — E118 Type 2 diabetes mellitus with unspecified complications: Secondary | ICD-10-CM

## 2019-11-23 DIAGNOSIS — F419 Anxiety disorder, unspecified: Secondary | ICD-10-CM | POA: Insufficient documentation

## 2019-11-23 DIAGNOSIS — G4733 Obstructive sleep apnea (adult) (pediatric): Secondary | ICD-10-CM

## 2019-11-23 DIAGNOSIS — E785 Hyperlipidemia, unspecified: Secondary | ICD-10-CM | POA: Insufficient documentation

## 2019-11-23 DIAGNOSIS — J4551 Severe persistent asthma with (acute) exacerbation: Secondary | ICD-10-CM

## 2019-11-23 DIAGNOSIS — E010 Iodine-deficiency related diffuse (endemic) goiter: Secondary | ICD-10-CM | POA: Diagnosis not present

## 2019-11-23 DIAGNOSIS — IMO0002 Reserved for concepts with insufficient information to code with codable children: Secondary | ICD-10-CM

## 2019-11-23 DIAGNOSIS — Z794 Long term (current) use of insulin: Secondary | ICD-10-CM

## 2019-11-23 DIAGNOSIS — I10 Essential (primary) hypertension: Secondary | ICD-10-CM | POA: Insufficient documentation

## 2019-11-23 DIAGNOSIS — R000 Tachycardia, unspecified: Secondary | ICD-10-CM | POA: Diagnosis not present

## 2019-11-23 DIAGNOSIS — Z9641 Presence of insulin pump (external) (internal): Secondary | ICD-10-CM | POA: Diagnosis not present

## 2019-11-23 DIAGNOSIS — Z6841 Body Mass Index (BMI) 40.0 and over, adult: Secondary | ICD-10-CM | POA: Insufficient documentation

## 2019-11-23 DIAGNOSIS — F329 Major depressive disorder, single episode, unspecified: Secondary | ICD-10-CM | POA: Insufficient documentation

## 2019-11-23 DIAGNOSIS — Z3042 Encounter for surveillance of injectable contraceptive: Secondary | ICD-10-CM | POA: Diagnosis not present

## 2019-11-23 DIAGNOSIS — F418 Other specified anxiety disorders: Secondary | ICD-10-CM

## 2019-11-23 DIAGNOSIS — Z8616 Personal history of COVID-19: Secondary | ICD-10-CM

## 2019-11-23 DIAGNOSIS — E1169 Type 2 diabetes mellitus with other specified complication: Secondary | ICD-10-CM

## 2019-11-23 DIAGNOSIS — F8081 Childhood onset fluency disorder: Secondary | ICD-10-CM | POA: Insufficient documentation

## 2019-11-23 DIAGNOSIS — E1165 Type 2 diabetes mellitus with hyperglycemia: Secondary | ICD-10-CM

## 2019-11-23 MED ORDER — MEDROXYPROGESTERONE ACETATE 150 MG/ML IM SUSP: 150.0000 mg | Freq: Once | INTRAMUSCULAR | Status: DC

## 2019-11-23 MED ORDER — SERTRALINE HCL 50 MG PO TABS
50.0000 mg | ORAL_TABLET | Freq: Every day | ORAL | 3 refills | Status: DC
Start: 2019-11-23 — End: 2020-03-22

## 2019-11-23 MED ORDER — IBUPROFEN 600 MG PO TABS
600.0000 mg | ORAL_TABLET | Freq: Three times a day (TID) | ORAL | 0 refills | Status: DC | PRN
Start: 2019-11-23 — End: 2020-01-29

## 2019-11-23 MED ORDER — ATORVASTATIN CALCIUM 20 MG PO TABS: 20.0000 mg | ORAL_TABLET | Freq: Every day | ORAL | 3 refills | Status: DC

## 2019-11-23 MED ORDER — ADVAIR HFA 115-21 MCG/ACT IN AERO
2.0000 | INHALATION_SPRAY | Freq: Two times a day (BID) | RESPIRATORY_TRACT | 12 refills | Status: DC
Start: 2019-11-23 — End: 2021-07-13

## 2019-11-23 MED ORDER — LOSARTAN POTASSIUM 50 MG PO TABS
50.0000 mg | ORAL_TABLET | Freq: Every day | ORAL | 3 refills | Status: DC
Start: 2019-11-23 — End: 2021-05-08

## 2019-11-23 MED ORDER — MEDROXYPROGESTERONE ACETATE 150 MG/ML IM SUSP
150.0000 mg | Freq: Once | INTRAMUSCULAR | Status: AC
  Administered 2019-11-23: 150 mg via INTRAMUSCULAR

## 2019-11-23 MED FILL — IBUPROFEN 600 MG TABLET: 600 | 10 days supply | Qty: 30 | Fill #0

## 2019-11-23 MED FILL — LOSARTAN POTASSIUM 50 MG TA: 50 | 90 days supply | Qty: 90 | Fill #0

## 2019-11-23 MED FILL — SERTRALINE HCL 50 MG TABLET: 50 | 30 days supply | Qty: 30 | Fill #0

## 2019-11-23 MED FILL — NovoLOG 100 UNIT/ML SOLN: 100 | 30 days supply | Qty: 60 | Fill #0

## 2019-11-23 MED FILL — ATORVASTATIN CALCIUM 20 MG: 20 | 90 days supply | Qty: 90 | Fill #0

## 2019-11-23 MED FILL — ADVAIR HFA 115-21 MCG INH: 115-21 | 30 days supply | Qty: 12 | Fill #0

## 2019-11-23 NOTE — Assessment & Plan Note (Signed)
Significant depression with associated anxiety  Will begin sertraline 50 mg daily and continue Abilify

## 2019-11-23 NOTE — Assessment & Plan Note (Signed)
Essential hypertension currently well controlled however concerned about lisinopril causing upper airway instability therefore switched to losartan 50 mg daily and discontinue further lisinopril

## 2019-11-23 NOTE — Assessment & Plan Note (Signed)
Thyromegaly is quite prominent at this visit we will obtain thyroid panel

## 2019-11-23 NOTE — Progress Notes (Signed)
Date last pap: 02/03/2019. Last Depo-Provera: [id number] Side Effects if any: n/a Serum HCG indicated? n/a. Depo-Provera 150 mg IM given by T.Sharlet Salina, RN. Next appointment due 02/09/2020

## 2019-11-23 NOTE — Assessment & Plan Note (Signed)
Patient is currently not on statin therapy will prescribe atorvastatin 20 mg daily per recommendation of endocrinology

## 2019-11-23 NOTE — Assessment & Plan Note (Signed)
Evidence for severe persistent asthma plan for this patient will be to begin Advair 2 inhalations twice daily and use albuterol as needed she was instructed as to proper HFA use

## 2019-11-23 NOTE — Progress Notes (Signed)
Post COVID symptoms C /o SOB , stutter, difficulty breathing, unable to fully take a deep breath Dealing with anxiety and depression since COVID and accident / Denies any thoughts of harming herself or others

## 2019-11-23 NOTE — Patient Instructions (Addendum)
Start Advair two puff twice a day Stop lisinopril  Start losartan one daily for blood pressure Stay on Abilify Start sertraline for depression Take ibuprofen 600mg  every 6 hours as needed for shortness of breath See our social worker on your depression Diabetes management per your endocrine MD We will have you go to [facility] for mask fit   Return to Dr Jenel Lucks in two weeks

## 2019-11-23 NOTE — Assessment & Plan Note (Signed)
Uncontrolled diabetes  The patient is currently on insulin pump and is managed by endocrinology plans per endocrinology
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
## 2019-11-23 NOTE — Assessment & Plan Note (Signed)
History of COVID-19 infection I am concerned some of this may be residual from this with her asthma syndrome but she does not have active Covid infection this time and is now out of isolation

## 2019-11-23 NOTE — Assessment & Plan Note (Signed)
Obstructive sleep apnea with nonfunctioning CPAP mask  Plan will be to refer patient for mask desensitization and mask fit

## 2019-11-24 LAB — THYROID PANEL WITH TSH
Free Thyroxine Index: 2.3 (ref 1.2–4.9)
T3 Uptake Ratio: 29 % (ref 24–39)
T4, Total: 8.1 ug/dL (ref 4.5–12.0)
TSH: 0.777 u[IU]/mL (ref 0.450–4.500)

## 2019-12-01 ENCOUNTER — Ambulatory Visit: Payer: Self-pay | Admitting: Neurology

## 2019-12-02 ENCOUNTER — Ambulatory Visit: Payer: Medicaid Other | Admitting: Licensed Clinical Social Worker
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
## 2019-12-08 ENCOUNTER — Ambulatory Visit: Payer: Medicaid Other | Attending: Critical Care Medicine | Admitting: Critical Care Medicine

## 2019-12-08 ENCOUNTER — Other Ambulatory Visit: Payer: Self-pay

## 2019-12-08 ENCOUNTER — Encounter: Payer: Self-pay | Admitting: Critical Care Medicine

## 2019-12-08 VITALS — BP 121/83 | HR 115 | Temp 98.3°F | Resp 18 | Ht 65.0 in | Wt 238.0 lb

## 2019-12-08 DIAGNOSIS — E1165 Type 2 diabetes mellitus with hyperglycemia: Secondary | ICD-10-CM

## 2019-12-08 DIAGNOSIS — M25512 Pain in left shoulder: Secondary | ICD-10-CM | POA: Diagnosis not present

## 2019-12-08 DIAGNOSIS — G8929 Other chronic pain: Secondary | ICD-10-CM

## 2019-12-08 DIAGNOSIS — Z794 Long term (current) use of insulin: Secondary | ICD-10-CM

## 2019-12-08 DIAGNOSIS — I10 Essential (primary) hypertension: Secondary | ICD-10-CM

## 2019-12-08 DIAGNOSIS — G4733 Obstructive sleep apnea (adult) (pediatric): Secondary | ICD-10-CM

## 2019-12-08 DIAGNOSIS — F418 Other specified anxiety disorders: Secondary | ICD-10-CM

## 2019-12-08 DIAGNOSIS — M25559 Pain in unspecified hip: Secondary | ICD-10-CM | POA: Diagnosis not present

## 2019-12-08 DIAGNOSIS — F8081 Childhood onset fluency disorder: Secondary | ICD-10-CM

## 2019-12-08 DIAGNOSIS — J4551 Severe persistent asthma with (acute) exacerbation: Secondary | ICD-10-CM

## 2019-12-08 DIAGNOSIS — M25561 Pain in right knee: Secondary | ICD-10-CM

## 2019-12-08 DIAGNOSIS — IMO0002 Reserved for concepts with insufficient information to code with codable children: Secondary | ICD-10-CM

## 2019-12-08 DIAGNOSIS — E785 Hyperlipidemia, unspecified: Secondary | ICD-10-CM

## 2019-12-08 DIAGNOSIS — E1169 Type 2 diabetes mellitus with other specified complication: Secondary | ICD-10-CM

## 2019-12-08 DIAGNOSIS — E118 Type 2 diabetes mellitus with unspecified complications: Secondary | ICD-10-CM

## 2019-12-08 NOTE — Assessment & Plan Note (Signed)
Chronic hip pain will refer to orthopedics

## 2019-12-08 NOTE — Assessment & Plan Note (Signed)
Difficult control type 2 diabetes now on insulin pump care per endocrinology

## 2019-12-08 NOTE — Assessment & Plan Note (Signed)
Patient to keep scheduled appointment for mask desensitization

## 2019-12-08 NOTE — Assessment & Plan Note (Signed)
Hypertension under improved control we will continue losartan at current dose level

## 2019-12-08 NOTE — Assessment & Plan Note (Signed)
Hyperlipidemia I will continue current plan dose of atorvastatin

## 2019-12-08 NOTE — Patient Instructions (Signed)
Return in 3 months to dr Delford Field  A speech therapy appointment will be made  An orthopedic referral will be made  No change in medications, please continue inhalers ,  Please resume the sertraline/zoloft daily

## 2019-12-08 NOTE — Progress Notes (Signed)
F /u Asthma   Pt has a pump on last reading 180 at 9:51

## 2019-12-08 NOTE — Assessment & Plan Note (Signed)
Asthma under control proved control with Advair will continue Advair as currently prescribed

## 2019-12-08 NOTE — Assessment & Plan Note (Signed)
Significant anxiety and depression patient encouraged to begin sertraline

## 2019-12-08 NOTE — Assessment & Plan Note (Signed)
Ongoing stuttering will refer patient to speech-language pathology

## 2019-12-09 ENCOUNTER — Ambulatory Visit: Payer: Medicaid Other | Admitting: Physician Assistant

## 2019-12-09 ENCOUNTER — Ambulatory Visit: Payer: Medicaid Other | Attending: Family Medicine | Admitting: Licensed Clinical Social Worker

## 2019-12-09 DIAGNOSIS — F4323 Adjustment disorder with mixed anxiety and depressed mood: Secondary | ICD-10-CM

## 2019-12-15 ENCOUNTER — Other Ambulatory Visit: Payer: Self-pay

## 2019-12-15 ENCOUNTER — Ambulatory Visit (HOSPITAL_BASED_OUTPATIENT_CLINIC_OR_DEPARTMENT_OTHER): Payer: Medicaid Other | Attending: Critical Care Medicine | Admitting: Internal Medicine

## 2019-12-15 DIAGNOSIS — G4733 Obstructive sleep apnea (adult) (pediatric): Secondary | ICD-10-CM

## 2019-12-16 ENCOUNTER — Ambulatory Visit (INDEPENDENT_AMBULATORY_CARE_PROVIDER_SITE_OTHER): Payer: Medicaid Other | Admitting: Orthopaedic Surgery

## 2019-12-16 ENCOUNTER — Encounter: Payer: Self-pay | Admitting: Orthopaedic Surgery

## 2019-12-16 VITALS — Ht 68.0 in | Wt 236.0 lb

## 2019-12-16 DIAGNOSIS — S161XXA Strain of muscle, fascia and tendon at neck level, initial encounter: Secondary | ICD-10-CM | POA: Diagnosis not present

## 2019-12-16 DIAGNOSIS — M25551 Pain in right hip: Secondary | ICD-10-CM

## 2019-12-16 DIAGNOSIS — G8929 Other chronic pain: Secondary | ICD-10-CM

## 2019-12-16 DIAGNOSIS — M25511 Pain in right shoulder: Secondary | ICD-10-CM

## 2019-12-16 MED ORDER — PREDNISONE 5 MG (21) PO TBPK
ORAL_TABLET | ORAL | 0 refills | Status: DC
Start: 2019-12-16 — End: 2020-01-29
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
## 2019-12-16 NOTE — BH Specialist Note (Signed)
Integrated Behavioral Health Visit via Telemedicine (Telephone)  12/09/2019 Sarah Garrison [id number]   Session Start time: 3:07 PM  Session End time: 3:25 PM Total time: 18  Referring Provider: Dr. Delford Field Type of Visit: Telephonic Patient location: Home [facility] Provider location: Office All persons participating in visit: LCSW and patient  Confirmed patient's address: Yes  Confirmed patient's phone number: Yes  Any changes to demographics: No   Confirmed patient's insurance: Yes  Any changes to patient's insurance: No   Discussed confidentiality: Yes    The following statements were read to the patient and/or legal guardian that are established with the [facility] Provider.  "The purpose of this phone visit is to provide behavioral health care while limiting exposure to the coronavirus (COVID19).  There is a possibility of technology failure and discussed alternative modes of communication if that failure occurs."  "By engaging in this telephone visit, you consent to the provision of healthcare.  Additionally, you authorize for your insurance to be billed for the services provided during this telephone visit."   Patient and/or legal guardian consented to telephone visit: Yes   PRESENTING CONCERNS: Patient and/or family reports the following symptoms/concerns: Pt reports difficulty managing increase in anxiety and depression symptoms triggered by ongoing medical conditions.  Symptoms include feeling down, difficulty sleeping, and panic attacks Duration of problem: April 2021; Severity of problem: severe  STRENGTHS (Protective Factors/Coping Skills): Patient is open to medication management through PCP Patient identified her children as motivation agents  GOALS ADDRESSED: Patient will: 1.  Reduce symptoms of: anxiety, depression and stress  2.  Increase knowledge and/or ability of: coping skills and healthy habits  3.  Demonstrate ability to: Increase healthy  adjustment to current life circumstances and Increase adequate support systems for patient/family  INTERVENTIONS: Interventions utilized:  Solution-Focused Strategies, Supportive Counseling, Psychoeducation and/or Health Education and Link to [employer] Standardized Assessments completed: Not Needed  ASSESSMENT: Patient currently experiencing increase in anxiety and depression symptoms triggered by ongoing medical conditions and financial strain.  Patient denies suicidal/homicidal ideations.  See strong support from brother who resides locally.  Patient may benefit from ongoing medication management through PCP.  LCSW discussed correlation between ones physical and mental health, in addition, to how stress can negatively impact both.  Healthy coping skills were discussed.  Patient is not interested in therapy or collaborative care at this time.  LCSW provided supportive resources to assist with unemployment appeal and assistance with utility/rent assistance.  PLAN: 1. Follow up with behavioral health clinician on : Contact LCSW with any additional behavioral health and/or resource needs 2. Behavioral recommendations: Utilize resources provided, follow-up with legal aid, and comply with medications as prescribed 3. Referral(s): Integrated [facility] [facility]  Bridgett Larsson, [place] 12/16/2019 11:41 PM

## 2019-12-22 ENCOUNTER — Ambulatory Visit (HOSPITAL_COMMUNITY)
Admission: EM | Admit: 2019-12-22 | Discharge: 2019-12-22 | Disposition: A | Discharge status: Home / Self Care | Payer: Medicaid Other

## 2019-12-22 ENCOUNTER — Other Ambulatory Visit: Payer: Self-pay

## 2019-12-22 ENCOUNTER — Encounter (HOSPITAL_COMMUNITY): Payer: Self-pay | Admitting: Family Medicine

## 2019-12-22 DIAGNOSIS — M25551 Pain in right hip: Secondary | ICD-10-CM

## 2019-12-22 DIAGNOSIS — S0591XA Unspecified injury of right eye and orbit, initial encounter: Secondary | ICD-10-CM

## 2019-12-22 DIAGNOSIS — J069 Acute upper respiratory infection, unspecified: Secondary | ICD-10-CM | POA: Diagnosis not present

## 2019-12-22 HISTORY — DX: Unspecified convulsions: R56.9

## 2019-12-22 MED ORDER — TETRACAINE HCL 0.5 % OP SOLN
OPHTHALMIC | Status: AC
  Filled 2019-12-22: qty 4
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
## 2019-12-22 NOTE — Discharge Instructions (Addendum)
OTC medicine as needed for symptoms. Follow-up with your doctor for any continued symptoms
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
## 2019-12-30 MED FILL — OMNIPOD DASH 5 PACK MISC: 30 days supply | Qty: 30 | Fill #0

## 2020-01-01 ENCOUNTER — Ambulatory Visit: Payer: Medicaid Other | Attending: Physician Assistant

## 2020-01-01 ENCOUNTER — Other Ambulatory Visit: Payer: Self-pay

## 2020-01-01 DIAGNOSIS — M25511 Pain in right shoulder: Secondary | ICD-10-CM | POA: Insufficient documentation

## 2020-01-01 DIAGNOSIS — M2560 Stiffness of unspecified joint, not elsewhere classified: Secondary | ICD-10-CM | POA: Insufficient documentation

## 2020-01-01 DIAGNOSIS — M25551 Pain in right hip: Secondary | ICD-10-CM | POA: Insufficient documentation

## 2020-01-01 DIAGNOSIS — R2689 Other abnormalities of gait and mobility: Secondary | ICD-10-CM | POA: Insufficient documentation

## 2020-01-01 DIAGNOSIS — G8929 Other chronic pain: Secondary | ICD-10-CM | POA: Diagnosis present
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
## 2020-01-07 ENCOUNTER — Ambulatory Visit: Payer: Medicaid Other | Attending: Physician Assistant | Admitting: Speech Pathology

## 2020-01-07 ENCOUNTER — Other Ambulatory Visit: Payer: Self-pay

## 2020-01-07 DIAGNOSIS — F8081 Childhood onset fluency disorder: Secondary | ICD-10-CM | POA: Diagnosis present

## 2020-01-07 DIAGNOSIS — R2689 Other abnormalities of gait and mobility: Secondary | ICD-10-CM | POA: Insufficient documentation

## 2020-01-07 DIAGNOSIS — M25511 Pain in right shoulder: Secondary | ICD-10-CM | POA: Insufficient documentation

## 2020-01-07 DIAGNOSIS — G8929 Other chronic pain: Secondary | ICD-10-CM | POA: Diagnosis present

## 2020-01-07 DIAGNOSIS — F444 Conversion disorder with motor symptom or deficit: Secondary | ICD-10-CM | POA: Diagnosis present

## 2020-01-07 DIAGNOSIS — M25551 Pain in right hip: Secondary | ICD-10-CM | POA: Insufficient documentation

## 2020-01-07 DIAGNOSIS — R498 Other voice and resonance disorders: Secondary | ICD-10-CM | POA: Diagnosis present

## 2020-01-07 DIAGNOSIS — M2560 Stiffness of unspecified joint, not elsewhere classified: Secondary | ICD-10-CM | POA: Diagnosis present
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
## 2020-01-07 NOTE — Patient Instructions (Signed)
Abdominal Breathing exercises : practice 15 minutes twice a day  Lie down on your back with a plastic cup on your belly and watch your belly move up with inhalation and down with exhalations  . Shoulders down - this is a cue to relax . Place your hand on your abdomen - this helps you focus on easy abdominal breath support - the best and most relaxed way to breathe . Breathe in through your nose and fill your belly with air, watching your hand move outward . Breathe out through your mouth and watch your belly move in. An audible "sh"  may help   Think of your belly as a balloon, when you fill with air (inhale), the balloon gets bigger. As the air goes out (exhale), the balloon deflates.  Breathe in for a count of 5 and breathe out for a count of 5

## 2020-01-08 ENCOUNTER — Other Ambulatory Visit: Payer: Self-pay | Admitting: Nurse Practitioner

## 2020-01-11 MED FILL — TRUEplus 5-BEVEL PEN NEEDLE: 32G X 4 MM | 25 days supply | Qty: 100 | Fill #0

## 2020-01-11 MED FILL — TRESIBA FLEXTOUCH 100 UNITS: 100 | 7 days supply | Qty: 3 | Fill #0

## 2020-01-12 ENCOUNTER — Other Ambulatory Visit: Payer: Self-pay

## 2020-01-12 ENCOUNTER — Ambulatory Visit: Payer: Medicaid Other | Admitting: Physical Therapy

## 2020-01-12 DIAGNOSIS — M2560 Stiffness of unspecified joint, not elsewhere classified: Secondary | ICD-10-CM

## 2020-01-12 DIAGNOSIS — M25551 Pain in right hip: Secondary | ICD-10-CM

## 2020-01-12 DIAGNOSIS — M25511 Pain in right shoulder: Secondary | ICD-10-CM

## 2020-01-12 DIAGNOSIS — R498 Other voice and resonance disorders: Secondary | ICD-10-CM | POA: Diagnosis not present

## 2020-01-12 DIAGNOSIS — R2689 Other abnormalities of gait and mobility: Secondary | ICD-10-CM

## 2020-01-12 DIAGNOSIS — G8929 Other chronic pain: Secondary | ICD-10-CM

## 2020-01-12 NOTE — Therapy (Signed)
[facility] Outpatient Rehabilitation [facility] [address] [place], [place], [address] Phone: [phone]   Fax:  [phone]  Physical Therapy Treatment  Patient Details  Name: Sarah Garrison MRN: [id number] Date of Birth: 01/23/1981 Referring Provider (PT): Cristie Hem, [place]   Encounter Date: 01/12/2020   PT End of Session - 01/12/20 1030    Visit Number 2    Number of Visits 4    Date for PT Re-Evaluation 01/19/20    Authorization Type MED PAY ASSURANCE; [place] MEDICAID HEALTHY BLUE    Authorization Time Period 01/01/20-01/22/20    Authorization - Visit Number 1    Authorization - Number of Visits 3    PT Start Time 1035    PT Stop Time 1117    PT Time Calculation (min) 42 min    Activity Tolerance Patient tolerated treatment well    Behavior During Therapy [facility] for tasks assessed/performed           Past Medical History:  Diagnosis Date  . Asthma   . COVID-19   . Diabetes mellitus without complication (HCC)   . GERD (gastroesophageal reflux disease)   . Hypertension   . Seizures (HCC)     Past Surgical History:  Procedure Laterality Date  . CESAREAN SECTION    . HERNIA REPAIR    . TUBAL LIGATION    . VENTRAL HERNIA REPAIR N/A 11/27/2017   Procedure: VENTRAL HERNIA REPAIR ERAS PATHWAY;  Surgeon: Harriette Bouillon, MD;  Location: [facility];  Service: General;  Laterality: N/A;  . WISDOM TOOTH EXTRACTION      There were no vitals filed for this visit.   Subjective Assessment - 01/12/20 1038    Subjective Pt reports that her hip feels a little bit better. Pt states that her R shoulder and knee hurt her the most today. Pt would like today's focus to be on her shoulder. Pt reports posterior R shoulder pain especially during movement. Pt states that the patch helped her R hip.    Pertinent History Covid !(    Limitations Sitting;Lifting;Standing;Walking;House hold activities    How long can you sit comfortably? no issue    How  long can you stand comfortably? 10-15 mins    How long can you walk comfortably? Unable    Diagnostic tests Hips 09/05/19:FINDINGS:Frontal view of the pelvis as well as frontal and frogleg lateral views of the right hip are obtained. No acute displaced fracture.Mild symmetrical bilateral hip joint space narrowing compatible with osteoarthritis. Soft tissues are unremarkable.    Patient Stated Goals For my pain to get better    Currently in Pain? Yes    Pain Score 8     Pain Location Shoulder    Pain Orientation Right              [facility] PT Assessment - 01/12/20 0001      AROM   Right Shoulder Extension 5 Degrees   Limited due to pain   Right Shoulder Flexion 80 Degrees   Limited due to pain; pain going down to elbow along biceps   Right Shoulder ABduction 70 Degrees   Limited due to pain   Right Shoulder Internal Rotation --   [facility]   Right Shoulder External Rotation --   [facility]     Palpation   Palpation comment Signficantly tender to the lightest palpation  [facility] Adult PT Treatment/Exercise - 01/12/20 0001      Knee/Hip Exercises: Aerobic   Nustep L1 x 5 min UE & LE      Shoulder Exercises: Seated   Retraction Strengthening;Both;10 reps    Other Seated Exercises Cervical retraction x 10      Shoulder Exercises: Standing   Other Standing Exercises Pendulum x 1 min side to side, forward/back      Shoulder Exercises: Pulleys   Flexion 1 minute    ABduction 1 minute      Shoulder Exercises: Stretch   Other Shoulder Stretches Upper trap stretch 2x 30 sec bilat    Other Shoulder Stretches Levator stretch 2x30 sec bilat      Manual Therapy   Manual Therapy Taping    Manual therapy comments Attempted; however, pt very tender to even light touch. Attempted self massage with tennis ball but pt also too tender    Kinesiotex Inhibit Muscle      Kinesiotix   Inhibit Muscle  Right upper trap                  PT Education - 01/12/20  1117    Education Details Discussed posture and improving head/neck posture to decrease neck/shoulder tightness. Provided pt on HEP accordingly.    Person(s) Educated Patient    Methods Explanation;Handout;Demonstration;Tactile cues;Verbal cues    Comprehension Verbalized understanding;Returned demonstration;Verbal cues required            PT Short Term Goals - 01/12/20 1123      PT SHORT TERM GOAL #1   Title Pt will be ind in an initail HEP    Baseline Currently no program    Time 3    Period Weeks    Status New    Target Date 01/22/20      PT SHORT TERM GOAL #2   Title Ptwill voice understanding/return demonstration of measure to reduce and manage R hip pain    Time 3    Period Weeks    Status New    Target Date 01/22/20      PT SHORT TERM GOAL #3   Title Establish LTGs following the initial 4 visits of PT care    Time 3    Period Weeks    Status New    Target Date 01/22/20      PT SHORT TERM GOAL #4   Title Pt will be independent with self correcting her forward head and rounded shoulder posture    Baseline Needs cueing    Time 3    Period Weeks    Status New    Target Date 02/02/20                    Plan - 01/12/20 1118    Clinical Impression Statement Pt reports improved R hip pain -- requests focus to be on her shoulder this session. Pt demonstrates significant forward head and rounded shoulder posture with visibly increased R upper trap tension (taping provided to inhibit this). Pt is very tender to even light palpation -- attempted self massage with tennis ball however she was unable to tolerate this. Treatment session focused on gentle stretching and ROM for her shoulder. Initiated postural stabilization exercises for shoulder and neck. R shoulder appears to be limited due to biceps impingement and poor scapular stability.    Personal Factors and Comorbidities Time since onset of injury/illness/exacerbation;Comorbidity 3+    Comorbidities Hx of  Covid-19, DM, asthma  Examination-Activity Limitations Bathing;Bend;Caring for Others;Carry;Dressing;Lift;Toileting;Stand;Stairs;Squat;Sleep;Sit;Reach Overhead;Locomotion Level;Transfers    Stability/Clinical Decision Making Evolving/Moderate complexity    Rehab Potential Fair    PT Frequency 2x / week    PT Duration 6 weeks    PT Treatment/Interventions ADLs/Self Care Home Management;Cryotherapy;Electrical Stimulation;Ultrasound;Traction;Moist Heat;Iontophoresis 4mg /ml Dexamethasone;Gait training;Stair training;Functional mobility training;Therapeutic activities;Therapeutic exercise;Manual techniques;Patient/family education;Passive range of motion;Dry needling;Taping;Vasopneumatic Device    PT Next Visit Plan Assess response to HEP and shoulder taping. Ionto as needed. Provide exs and modalities to assist in pain reduction/management. Assess R knee as approval is received from MD. Continue to progress shoulder, neck and hip exercises as able. Consider possible lumbar involvement/core strengthening due to pt with very tight paraspinals with light palpation.    PT Home Exercise Plan Access Code:    Consulted and Agree with Plan of Care Patient           Patient will benefit from skilled therapeutic intervention in order to improve the following deficits and impairments:  Abnormal gait, Difficulty walking, Decreased range of motion, Obesity, Decreased activity tolerance, Pain, Impaired flexibility, Decreased mobility, Decreased strength, Postural dysfunction  Visit Diagnosis: Chronic hip pain, right  Limited joint range of motion (ROM)  Other abnormalities of gait and mobility  Chronic right shoulder pain     Problem List Patient Active Problem List   Diagnosis Date Noted  . Hip pain 12/08/2019  . Hyperlipidemia associated with type 2 diabetes mellitus (HCC) 11/23/2019  . History of COVID-19 10/27/2019  . Stuttering 10/27/2019  . Depression with anxiety 10/27/2019  .  Obesity, Class III, BMI 40-49.9 (morbid obesity) (HCC) 06/30/2019  . OSA (obstructive sleep apnea) 02/16/2019  . Essential hypertension 06/22/2018  . Uncontrolled type 2 diabetes mellitus with complication, with long-term current use of insulin (HCC) 12/13/2016  . Asthma 07/30/2016  . 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/07/2016  . DM neuropathy, type II diabetes mellitus [facility]) 08/24/2015    [facility] [address] [place] PT, DPT 01/12/2020, 11:29 AM  [facility] [address] [place], [place], [place] Phone: [phone]   Fax:  [phone]  Name: Sarah Garrison MRN: Angelina Sheriff Date of Birth: 12-Oct-1980

## 2020-01-13 MED FILL — NovoLOG 100 UNIT/ML SOLN: 100 | 30 days supply | Qty: 60 | Fill #1

## 2020-01-14 ENCOUNTER — Other Ambulatory Visit: Payer: Self-pay

## 2020-01-14 ENCOUNTER — Ambulatory Visit: Payer: Medicaid Other

## 2020-01-14 DIAGNOSIS — R2689 Other abnormalities of gait and mobility: Secondary | ICD-10-CM

## 2020-01-14 DIAGNOSIS — G8929 Other chronic pain: Secondary | ICD-10-CM

## 2020-01-14 DIAGNOSIS — R498 Other voice and resonance disorders: Secondary | ICD-10-CM | POA: Diagnosis not present

## 2020-01-14 DIAGNOSIS — M2560 Stiffness of unspecified joint, not elsewhere classified: Secondary | ICD-10-CM

## 2020-01-14 NOTE — Therapy (Signed)
[facility] Outpatient Rehabilitation [facility] [address] [place], [place], [address] Phone: [phone]   Fax:  [phone]  Physical Therapy Treatment  Patient Details  Name: Sarah Garrison MRN: [id number] Date of Birth: Oct 13, 1980 Referring Provider (PT): Cristie Hem, [place]   Encounter Date: 01/14/2020   PT End of Session - 01/14/20 0924    Visit Number 3    Number of Visits 4    Authorization Type MED PAY ASSURANCE; [place] MEDICAID HEALTHY BLUE    Authorization Time Period 01/01/20-01/22/20    Authorization - Visit Number 2    Authorization - Number of Visits 3    PT Start Time [phone]    PT Stop Time 0930    PT Time Calculation (min) 49 min    Activity Tolerance Patient tolerated treatment well;Patient limited by pain    Behavior During Therapy [facility] for tasks assessed/performed           Past Medical History:  Diagnosis Date   Asthma    COVID-19    Diabetes mellitus without complication (HCC)    GERD (gastroesophageal reflux disease)    Hypertension    Seizures (HCC)     Past Surgical History:  Procedure Laterality Date   CESAREAN SECTION     HERNIA REPAIR     TUBAL LIGATION     VENTRAL HERNIA REPAIR N/A 11/27/2017   Procedure: VENTRAL HERNIA REPAIR ERAS PATHWAY;  Surgeon: Harriette Bouillon, MD;  Location: [facility];  Service: General;  Laterality: N/A;   WISDOM TOOTH EXTRACTION      There were no vitals filed for this visit.   Subjective Assessment - 01/14/20 0848    Subjective Pt reports her shoulder is not feeling better. Pt states she has been completing her exs at home.    Pertinent History Covid !(    Limitations Sitting;Lifting;Standing;Walking;House hold activities    Diagnostic tests Hips 09/05/19:FINDINGS:Frontal view of the pelvis as well as frontal and frogleg lateral views of the right hip are obtained. No acute displaced fracture.Mild symmetrical bilateral hip joint space narrowing compatible with  osteoarthritis. Soft tissues are unremarkable.    Patient Stated Goals For my pain to get better    Currently in Pain? Yes    Pain Score 8     Pain Location Shoulder    Pain Orientation Right    Pain Descriptors / Indicators Throbbing;Sharp;Aching    Pain Type Chronic pain    Pain Onset Other (comment)   April   Pain Frequency Constant    Aggravating Factors  I hurt all time    Pain Relieving Factors Ionto for hip    Effect of Pain on Daily Activities Significanly impact activities    Pain Score 6    Pain Location Hip    Pain Orientation Right    Pain Descriptors / Indicators Throbbing    Pain Type Chronic pain    Pain Onset Other (comment)   April   Pain Frequency Constant              [facility] PT Assessment - 01/14/20 0001      Special Tests    Special Tests Rotator Cuff Impingement    Rotator Cuff Impingment tests Leanord Asal test;Full Can test;Empty Can test;Speed's test      Hawkins-Kennedy test   Findings Positive    Side Right      Empty Can test   Findings Negative    Side Right    Comment painful, but  not weak      Full Can test   Findings Positive    Side Right    Comment painful, not weak      Speed's test   Findings Positive    Side Right    Comment painful                         [facility] Adult PT Treatment/Exercise - 01/14/20 0001      Ambulation/Gait   Gait Pattern Within Functional Limits;Step-through pattern      Exercises   Exercises Shoulder      Shoulder Exercises: Seated   Retraction Strengthening;Both;10 reps    Other Seated Exercises Cervical retraction x 10      Shoulder Exercises: ROM/Strengthening   Pendulum R UE 10 reps for flex/ext; DC with pt not tolerating      Shoulder Exercises: Stretch   Other Shoulder Stretches Upper trap stretch 2x 20 sec R, and with trunk lean to the L    Other Shoulder Stretches Levator stretch 2x20 sec R      Modalities   Modalities Iontophoresis      Iontophoresis   Type of  Iontophoresis Dexamethasone    Location Anterior R shoulder    Dose 4mg /ml; 63ml    Time 6 hours                  PT Education - 01/14/20 0924    Education Details HEP: Dced pendulum ex. Pt was not able to tolerate. educated pt on potential increase in blood sugar c iontophoresis treatment. education re; proper posture in sitting and use of arm support to assist in pain management.    Person(s) Educated Patient    Methods Explanation    Comprehension Verbalized understanding            PT Short Term Goals - 01/12/20 1123      PT SHORT TERM GOAL #1   Title Pt will be ind in an initail HEP    Baseline Currently no program    Time 3    Period Weeks    Status New    Target Date 01/22/20      PT SHORT TERM GOAL #2   Title Ptwill voice understanding/return demonstration of measure to reduce and manage R hip pain    Time 3    Period Weeks    Status New    Target Date 01/22/20      PT SHORT TERM GOAL #3   Title Establish LTGs following the initial 4 visits of PT care    Time 3    Period Weeks    Status New    Target Date 01/22/20      PT SHORT TERM GOAL #4   Title Pt will be independent with self correcting her forward head and rounded shoulder posture    Baseline Needs cueing    Time 3    Period Weeks    Status New    Target Date 02/02/20                    Plan - 01/14/20 0940    Clinical Impression Statement Pt continues to report improved R hip pain. Multiple impingement/rotator cuff tests were painful, but not weak. Pt was also TTP thoughout he s GH and upper shoulder area to very light touch. A defferential Dx was not able to be determined. Ther ex to address posture and ROM/flexibility of the  GH jt and upper shoulder were completed and were fairly tolerated. Inotophoresis was applied to the ant. GH Jt area for pain management.    Personal Factors and Comorbidities Time since onset of injury/illness/exacerbation;Comorbidity 3+    Comorbidities Hx  of Covid-19, DM, asthma    Examination-Activity Limitations Bathing;Bend;Caring for Others;Carry;Dressing;Lift;Toileting;Stand;Stairs;Squat;Sleep;Sit;Reach Overhead;Locomotion Level;Transfers    Stability/Clinical Decision Making Evolving/Moderate complexity    Clinical Decision Making Moderate    Rehab Potential Fair    PT Frequency 2x / week    PT Duration 6 weeks    PT Treatment/Interventions ADLs/Self Care Home Management;Cryotherapy;Electrical Stimulation;Ultrasound;Traction;Moist Heat;Iontophoresis 4mg /ml Dexamethasone;Gait training;Stair training;Functional mobility training;Therapeutic activities;Therapeutic exercise;Manual techniques;Patient/family education;Passive range of motion;Dry needling;Taping;Vasopneumatic Device    PT Next Visit Plan Assess response to HEP and shoulder taping. Ionto as needed. Provide exs and modalities to assist in pain reduction/management. Assess R knee as approval is received from MD. Continue to progress shoulder, neck and hip exercises as able. Consider possible lumbar involvement/core strengthening due to pt with very tight paraspinals with light palpation.    PT Home Exercise Plan Access Code: - pendulum ex was Dced    Consulted and Agree with Plan of Care Patient           Patient will benefit from skilled therapeutic intervention in order to improve the following deficits and impairments:  Abnormal gait, Difficulty walking, Decreased range of motion, Obesity, Decreased activity tolerance, Pain, Impaired flexibility, Decreased mobility, Decreased strength, Postural dysfunction  Visit Diagnosis: Chronic hip pain, right  Limited joint range of motion (ROM)  Other abnormalities of gait and mobility  Chronic right shoulder pain     Problem List Patient Active Problem List   Diagnosis Date Noted   Hip pain 12/08/2019   Hyperlipidemia associated with type 2 diabetes mellitus (HCC) 11/23/2019   History of COVID-19 10/27/2019    Stuttering 10/27/2019   Depression with anxiety 10/27/2019   Obesity, Class III, BMI 40-49.9 (morbid obesity) (HCC) 06/30/2019   OSA (obstructive sleep apnea) 02/16/2019   Essential hypertension 06/22/2018   Uncontrolled type 2 diabetes mellitus with complication, with long-term current use of insulin (HCC) 12/13/2016   Asthma 07/30/2016   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/07/2016   DM neuropathy, type II diabetes mellitus (HCC) 08/24/2015    08/26/2015 MS, PT 01/14/20 9:56 AM   [facility] Outpatient Rehabilitation [facility] [address] [place], [place], [place] Phone: [phone]   Fax:  [phone]  Name: SONITA MICHIELS MRN: Angelina Sheriff Date of Birth: Sep 15, 1980

## 2020-01-18 ENCOUNTER — Ambulatory Visit: Payer: No Typology Code available for payment source | Attending: Family Medicine

## 2020-01-18 ENCOUNTER — Other Ambulatory Visit: Payer: Self-pay

## 2020-01-18 DIAGNOSIS — Z111 Encounter for screening for respiratory tuberculosis: Secondary | ICD-10-CM

## 2020-01-18 NOTE — Progress Notes (Signed)
Patient arrived to clinic for TB skin test.  Test was placed in right for arm.  Patient was informed to return to clinic in 48 to 72 hours.

## 2020-01-19 ENCOUNTER — Ambulatory Visit: Payer: Medicaid Other | Admitting: Physical Therapy

## 2020-01-19 DIAGNOSIS — R2689 Other abnormalities of gait and mobility: Secondary | ICD-10-CM

## 2020-01-19 DIAGNOSIS — R498 Other voice and resonance disorders: Secondary | ICD-10-CM | POA: Diagnosis not present

## 2020-01-19 DIAGNOSIS — G8929 Other chronic pain: Secondary | ICD-10-CM

## 2020-01-19 DIAGNOSIS — M2560 Stiffness of unspecified joint, not elsewhere classified: Secondary | ICD-10-CM

## 2020-01-19 DIAGNOSIS — M25511 Pain in right shoulder: Secondary | ICD-10-CM

## 2020-01-19 NOTE — Therapy (Signed)
[facility] Outpatient Rehabilitation [facility] [address] [place], [place], [address] Phone: [phone]   Fax:  [phone]  Physical Therapy Treatment  Patient Details  Name: Sarah Garrison MRN: [id number] Date of Birth: [birth year] Referring Provider (PT): Cristie Hem, [place]   Encounter Date: 01/19/2020   PT End of Session - 01/19/20 1000    Visit Number 4    Number of Visits 4    Authorization Type MED PAY ASSURANCE; [place] MEDICAID HEALTHY BLUE    Authorization Time Period 01/01/20-01/22/20    Authorization - Visit Number 3    Authorization - Number of Visits 3    PT Start Time 0915    PT Stop Time 1000    PT Time Calculation (min) 45 min    Activity Tolerance Patient tolerated treatment well;Patient limited by pain    Behavior During Therapy [facility] for tasks assessed/performed           Past Medical History:  Diagnosis Date  . Asthma   . COVID-19   . Diabetes mellitus without complication (HCC)   . GERD (gastroesophageal reflux disease)   . Hypertension   . Seizures (HCC)     Past Surgical History:  Procedure Laterality Date  . CESAREAN SECTION    . HERNIA REPAIR    . TUBAL LIGATION    . VENTRAL HERNIA REPAIR N/A 11/27/2017   Procedure: VENTRAL HERNIA REPAIR ERAS PATHWAY;  Surgeon: Harriette Bouillon, MD;  Location: [facility];  Service: General;  Laterality: N/A;  . WISDOM TOOTH EXTRACTION      There were no vitals filed for this visit.                      [facility] Adult PT Treatment/Exercise - 01/19/20 0001      Shoulder Exercises: Supine   External Rotation AROM;Right;10 reps    Flexion AAROM;Right;5 reps    ABduction AAROM;Right;10 reps    Other Supine Exercises neck retraction against pillow x 10    Other Supine Exercises shoulder retraction x 10      Shoulder Exercises: Seated   Retraction Strengthening;Both;10 reps    Row Strengthening;Both;10 reps    Theraband Level (Shoulder Row) Level 2 (Red)     Flexion Right;10 reps;AAROM   tabloe    Abduction Right;AAROM;10 reps   with table     Shoulder Exercises: Pulleys   Flexion 1 minute   cues to squeeze shoulder blades back; pt reports "popping"     Shoulder Exercises: Stretch   Other Shoulder Stretches upper trap stretch bilat x 30 sec each, first rib mobilization x 10    Other Shoulder Stretches Levator stretch x 30 sec bilat, doorway pec stretch low & mid x 30 sec                    PT Short Term Goals - 01/12/20 1123      PT SHORT TERM GOAL #1   Title Pt will be ind in an initail HEP    Baseline Currently no program    Time 3    Period Weeks    Status New    Target Date 01/22/20      PT SHORT TERM GOAL #2   Title Ptwill voice understanding/return demonstration of measure to reduce and manage R hip pain    Time 3    Period Weeks    Status New    Target Date 01/22/20  PT SHORT TERM GOAL #3   Title Establish LTGs following the initial 4 visits of PT care    Time 3    Period Weeks    Status New    Target Date 01/22/20      PT SHORT TERM GOAL #4   Title Pt will be independent with self correcting her forward head and rounded shoulder posture    Baseline Needs cueing    Time 3    Period Weeks    Status New    Target Date 02/02/20                    Plan - 01/19/20 1001    Clinical Impression Statement Pt's shoulder remains her biggest issue. She continues to be intolerant of manual therapy or any gentle palpation/light touch. Treatment focused on AAROM and AROM within her available range, stretching neck/shoulder, and initiating scapular strengthening with tband.    Personal Factors and Comorbidities Time since onset of injury/illness/exacerbation;Comorbidity 3+    Comorbidities Hx of Covid-19, DM, asthma    Examination-Activity Limitations Bathing;Bend;Caring for Others;Carry;Dressing;Lift;Toileting;Stand;Stairs;Squat;Sleep;Sit;Reach Overhead;Locomotion Level;Transfers    Stability/Clinical  Decision Making Evolving/Moderate complexity    Rehab Potential Fair    PT Frequency 2x / week    PT Duration 6 weeks    PT Treatment/Interventions ADLs/Self Care Home Management;Cryotherapy;Electrical Stimulation;Ultrasound;Traction;Moist Heat;Iontophoresis 4mg /ml Dexamethasone;Gait training;Stair training;Functional mobility training;Therapeutic activities;Therapeutic exercise;Manual techniques;Patient/family education;Passive range of motion;Dry needling;Taping;Vasopneumatic Device    PT Next Visit Plan Assess response to HEP and shoulder taping. Ionto as needed. Provide exs and modalities to assist in pain reduction/management. Assess R knee as approval is received from MD. Continue to progress shoulder, neck and hip exercises as able. Consider possible lumbar involvement/core strengthening due to pt with very tight paraspinals with light palpation.    PT Home Exercise Plan Access Code: - added table AAROM and row with red tband    Consulted and Agree with Plan of Care Patient           Patient will benefit from skilled therapeutic intervention in order to improve the following deficits and impairments:  Abnormal gait, Difficulty walking, Decreased range of motion, Obesity, Decreased activity tolerance, Pain, Impaired flexibility, Decreased mobility, Decreased strength, Postural dysfunction  Visit Diagnosis: Chronic hip pain, right  Limited joint range of motion (ROM)  Other abnormalities of gait and mobility  Chronic right shoulder pain     Problem List Patient Active Problem List   Diagnosis Date Noted  . Hip pain 12/08/2019  . Hyperlipidemia associated with type 2 diabetes mellitus (HCC) 11/23/2019  . History of COVID-19 10/27/2019  . Stuttering 10/27/2019  . Depression with anxiety 10/27/2019  . Obesity, Class III, BMI 40-49.9 (morbid obesity) (HCC) 06/30/2019  . OSA (obstructive sleep apnea) 02/16/2019  . Essential hypertension 06/22/2018  . Uncontrolled type 2  diabetes mellitus with complication, with long-term current use of insulin (HCC) 12/13/2016  . Asthma 07/30/2016  . 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/07/2016  . DM neuropathy, type II diabetes mellitus [facility]) 08/24/2015    [facility] [address] [place] PT, DPT 01/19/2020, 10:23 AM  [facility] [address] [place], [place], [place] Phone: [phone]   Fax:  [phone]  Name: Sarah Garrison MRN: Angelina Sheriff Date of Birth: 1981/04/01

## 2020-01-20 ENCOUNTER — Ambulatory Visit: Payer: Medicaid Other | Attending: Family Medicine

## 2020-01-20 ENCOUNTER — Other Ambulatory Visit: Payer: Self-pay

## 2020-01-20 DIAGNOSIS — Z111 Encounter for screening for respiratory tuberculosis: Secondary | ICD-10-CM

## 2020-01-20 LAB — TB SKIN TEST
Induration: 0 mm
TB Skin Test: NEGATIVE

## 2020-01-20 NOTE — Progress Notes (Signed)
Patient arrived at clinic to get TB skin test read.  Test was negative and patient was given documentation of negative results.

## 2020-01-21 ENCOUNTER — Ambulatory Visit: Payer: Medicaid Other | Admitting: Physical Therapy

## 2020-01-21 DIAGNOSIS — G8929 Other chronic pain: Secondary | ICD-10-CM

## 2020-01-21 DIAGNOSIS — R2689 Other abnormalities of gait and mobility: Secondary | ICD-10-CM

## 2020-01-21 DIAGNOSIS — M2560 Stiffness of unspecified joint, not elsewhere classified: Secondary | ICD-10-CM

## 2020-01-21 DIAGNOSIS — R498 Other voice and resonance disorders: Secondary | ICD-10-CM | POA: Diagnosis not present

## 2020-01-21 DIAGNOSIS — M25511 Pain in right shoulder: Secondary | ICD-10-CM

## 2020-01-21 NOTE — Therapy (Addendum)
[facility] [address] [place], [place], [address] Phone: [phone]   Fax:  [phone]  Physical Therapy Treatment/Medicaid Re-auth  Patient Details  Name: Sarah Garrison MRN: [id number] Date of Birth: 24-Nov-1980 Referring Provider (PT): Cristie Hem, PA-C  Check all possible CPT codes:      []  [phone] (Therapeutic Exercise)  []  [phone] (SLP Treatment)  []  [phone] (Neuro Re-ed)   []  92526 (Swallowing Treatment)   []  [phone] (Gait Training)   []  [phone] (Cognitive Training, 1st 15 minutes) []  97140 (Manual Therapy)   []  97130 (Cognitive Training, each add'l 15 minutes)  []  97530 (Therapeutic Activities)  []  Other, List CPT Code ____________    []  97535 (Self Care)       [x]  All codes above (97110 - 97535)  []  97012 (Mechanical Traction)  [x]  97014 (E-stim Unattended)  []  97032 (E-stim manual)  []  97033 (Ionto)  [x]  97035 (Ultrasound)  []  97016 (Vaso)  []  97760 (Orthotic Fit) []  93235 (Prosthetic Training) []  (Physical Performance Training) []  (Aquatic Therapy) []  57322 (Canalith Repositioning) []  (Contrast Bath) []  02542 (Paraffin) []  97597 (Wound Care 1st 20 sq cm) []  97598 (Wound Care each add'l 20 sq cm)      Encounter Date: 01/21/2020   PT End of Session - 01/21/20 0919    Visit Number 5    Number of Visits 12    Date for PT Re-Evaluation 02/19/20    Authorization Type MED PAY ASSURANCE; [place] MEDICAID [employer] BLUE    Authorization Time Period 01/01/20-01/22/20    Authorization - Visit Number 4    Authorization - Number of Visits 3    PT Start Time 0920    PT Stop Time 1005    PT Time Calculation (min) 45 min    Activity Tolerance Patient tolerated treatment well;Patient limited by pain    Behavior During Therapy WFL for tasks assessed/performed           Past Medical History:  Diagnosis Date  . Asthma   . COVID-19   . Diabetes mellitus without complication (HCC)   . GERD  (gastroesophageal reflux disease)   . Hypertension   . Seizures (HCC)     Past Surgical History:  Procedure Laterality Date  . CESAREAN SECTION    . HERNIA REPAIR    . TUBAL LIGATION    . VENTRAL HERNIA REPAIR N/A 11/27/2017   Procedure: VENTRAL HERNIA REPAIR ERAS PATHWAY;  Surgeon: , MD;  Location: [facility];  Service: General;  Laterality: N/A;  . WISDOM TOOTH EXTRACTION      There were no vitals filed for this visit.   Subjective Assessment - 01/21/20 0922    Subjective Pt reports some improvement with her shoulder. Pt reports hip still hurts but the shoulder hurts more.    Pertinent History Covid !(    Limitations Sitting;Lifting;Standing;Walking;House hold activities    Diagnostic tests Hips 09/05/19:FINDINGS:Frontal view of the pelvis as well as frontal and frogleg lateral views of the right hip are obtained. No acute displaced fracture.Mild symmetrical bilateral hip joint space narrowing compatible with osteoarthritis. Soft tissues are unremarkable.    Patient Stated Goals For my pain to get better    Currently in Pain? Yes    Pain Score 5     Pain Location Shoulder    Pain Orientation Right    Pain Onset Other (comment)   April   Pain Onset Other (comment)   April  [facility] Adult PT Treatment/Exercise - 01/21/20 0001      Shoulder Exercises: Seated   Row Strengthening;Both;20 reps    Theraband Level (Shoulder Row) Level 2 (Red)    Flexion Right;AAROM;20 reps    Abduction Right;AAROM;20 reps      Shoulder Exercises: Standing   External Rotation Strengthening;Right;20 reps    Theraband Level (Shoulder External Rotation) Level 2 (Red)    Row Strengthening;Both;Theraband;20 reps    Theraband Level (Shoulder Row) Level 3 (Green)    Other Standing Exercises finger wall crawl flexion & abduction x 5 sets    Other Standing Exercises shoulder retraction + chin tuck x 10      Shoulder Exercises:  ROM/Strengthening   Wall Wash flexion x 10      Shoulder Exercises: Stretch   Other Shoulder Stretches upper trap stretch bilat x 30 sec each    Other Shoulder Stretches Levator stretch x 30 sec bilat                    PT Short Term Goals - 01/12/20 1123      PT SHORT TERM GOAL #1   Title Pt will be ind in an initail HEP    Baseline Currently no program    Time 3    Period Weeks    Status New    Target Date 01/22/20      PT SHORT TERM GOAL #2   Title Ptwill voice understanding/return demonstration of measure to reduce and manage R hip pain    Time 3    Period Weeks    Status New    Target Date 01/22/20      PT SHORT TERM GOAL #3   Title Establish LTGs following the initial 4 visits of PT care    Time 3    Period Weeks    Status New    Target Date 01/22/20      PT SHORT TERM GOAL #4   Title Pt will be independent with self correcting her forward head and rounded shoulder posture    Baseline Needs cueing    Time 3    Period Weeks    Status New    Target Date 02/02/20             PT Long Term Goals - 01/21/20 1001      PT LONG TERM GOAL #1   Title pt will be independent with advanced HEP    Time 4    Period Weeks    Status New    Target Date 02/18/20      PT LONG TERM GOAL #2   Title Pt will have full pain free shoulder ROM    Baseline ~90 deg until pain    Time 4    Period Weeks    Status New    Target Date 02/18/20      PT LONG TERM GOAL #3   Title Pt will be able to lift 10 lbs overhead for home tasks    Baseline Unable    Time 4    Period Weeks    Status New    Target Date 02/18/20      PT LONG TERM GOAL #4   Title Pt will be able to stand comfortably for 30 minutes to be able to perform household chores    Baseline 15-20 minutes    Time 4    Period Weeks    Status New    Target Date 02/18/20  PT LONG TERM GOAL #5   Title Pt will be able to amb around the grocery store without discomfort    Baseline Unable on initial  eval    Time 4    Period Weeks    Status New    Target Date 02/18/20                 Plan - 01/21/20 0958    Clinical Impression Statement Pt reports some continued hip pain; however, shoulder still remains the most painful. Trial of e-stim since pt is too tender to perform TPR for high spasming upper trap with multiple trigger points. Treatment focused on continued AAROM and posterior shoulder/scapular strengthening. Pt tolerated treatment well. Pt does report improvement after last session.    Personal Factors and Comorbidities Time since onset of injury/illness/exacerbation;Comorbidity 3+    Comorbidities Hx of Covid-19, DM, asthma    Examination-Activity Limitations Bathing;Bend;Caring for Others;Carry;Dressing;Lift;Toileting;Stand;Stairs;Squat;Sleep;Sit;Reach Overhead;Locomotion Level;Transfers    Stability/Clinical Decision Making Evolving/Moderate complexity    Rehab Potential Fair    PT Frequency 2x / week    PT Duration 6 weeks    PT Treatment/Interventions ADLs/Self Care Home Management;Cryotherapy;Electrical Stimulation;Ultrasound;Traction;Moist Heat;Iontophoresis 4mg /ml Dexamethasone;Gait training;Stair training;Functional mobility training;Therapeutic activities;Therapeutic exercise;Manual techniques;Patient/family education;Passive range of motion;Dry needling;Taping;Vasopneumatic Device    PT Next Visit Plan Assess response to HEP. Ionto as needed. Provide exs and modalities to assist in pain reduction/management. Assess R knee as approval is received from MD. Continue to progress shoulder, neck and hip exercises as able. Consider possible lumbar involvement/core strengthening due to pt with very tight paraspinals with light palpation.    PT Home Exercise Plan Access Code: - added table AAROM, row, shoulder extension, and shoulder ER with tband    Consulted and Agree with Plan of Care Patient           Patient will benefit from skilled therapeutic intervention  in order to improve the following deficits and impairments:  Abnormal gait, Difficulty walking, Decreased range of motion, Obesity, Decreased activity tolerance, Pain, Impaired flexibility, Decreased mobility, Decreased strength, Postural dysfunction  Visit Diagnosis: Limited joint range of motion (ROM)  Chronic hip pain, right  Other abnormalities of gait and mobility  Chronic right shoulder pain     Problem List Patient Active Problem List   Diagnosis Date Noted  . Hip pain 12/08/2019  . Hyperlipidemia associated with type 2 diabetes mellitus (HCC) 11/23/2019  . History of COVID-19 10/27/2019  . Stuttering 10/27/2019  . Depression with anxiety 10/27/2019  . Obesity, Class III, BMI 40-49.9 (morbid obesity) (HCC) 06/30/2019  . OSA (obstructive sleep apnea) 02/16/2019  . Essential hypertension 06/22/2018  . Uncontrolled type 2 diabetes mellitus with complication, with long-term current use of insulin (HCC) 12/13/2016  . Asthma 07/30/2016  . 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/07/2016  . DM neuropathy, type II diabetes mellitus [facility]) 08/24/2015    [facility] [address] PT, DPT 01/21/2020, 12:48 PM  [facility] [address] [place], [place], [place] Phone: [phone]   Fax:  [phone]  Name: Sarah Garrison MRN: Angelina Sheriff Date of Birth: Oct 12, 1980

## 2020-01-25 ENCOUNTER — Other Ambulatory Visit: Payer: Self-pay

## 2020-01-25 ENCOUNTER — Ambulatory Visit: Payer: Self-pay

## 2020-01-25 ENCOUNTER — Encounter (HOSPITAL_COMMUNITY): Payer: Self-pay | Admitting: Emergency Medicine

## 2020-01-25 ENCOUNTER — Emergency Department (HOSPITAL_COMMUNITY): Payer: Medicaid Other

## 2020-01-25 DIAGNOSIS — R000 Tachycardia, unspecified: Secondary | ICD-10-CM | POA: Insufficient documentation

## 2020-01-25 DIAGNOSIS — Z87891 Personal history of nicotine dependence: Secondary | ICD-10-CM | POA: Insufficient documentation

## 2020-01-25 DIAGNOSIS — I10 Essential (primary) hypertension: Secondary | ICD-10-CM | POA: Diagnosis not present

## 2020-01-25 DIAGNOSIS — Z8616 Personal history of COVID-19: Secondary | ICD-10-CM | POA: Diagnosis not present

## 2020-01-25 DIAGNOSIS — Z794 Long term (current) use of insulin: Secondary | ICD-10-CM | POA: Insufficient documentation

## 2020-01-25 DIAGNOSIS — R0602 Shortness of breath: Secondary | ICD-10-CM | POA: Insufficient documentation

## 2020-01-25 DIAGNOSIS — Z79899 Other long term (current) drug therapy: Secondary | ICD-10-CM | POA: Diagnosis not present

## 2020-01-25 DIAGNOSIS — J45909 Unspecified asthma, uncomplicated: Secondary | ICD-10-CM | POA: Diagnosis not present

## 2020-01-25 DIAGNOSIS — E1140 Type 2 diabetes mellitus with diabetic neuropathy, unspecified: Secondary | ICD-10-CM | POA: Diagnosis not present

## 2020-01-25 LAB — CBG MONITORING, ED: Glucose-Capillary: 324 mg/dL — ABNORMAL HIGH (ref 70–99)

## 2020-01-25 LAB — GLUCOSE, CAPILLARY: Glucose-Capillary: 219 mg/dL — ABNORMAL HIGH (ref 70–99)

## 2020-01-25 NOTE — ED Triage Notes (Signed)
Patient had COVID in April, has had negative COVID tests since then. Has had SOB that has gotten worse since her discharge from the ICU, patient is tearful upon presentation.

## 2020-01-25 NOTE — Telephone Encounter (Signed)
Patient called very SOB talking in phrases.  She states she has had SOB since her COVID-19 with pneumonia in May.  She was hospitalized and spent time in ICU.  She states that things are just not getting better.  She is unable to lay flat at night. She is SOB with speaking and wearing a mask is impossible. She states that she has a speech therapist. Per protocol patient will go to ER for evaluation of her breathing.  She was read care advice.  She verbalized understanding of all information. She will call for office follow up after checked in ER.  Reason for Disposition . [1] MODERATE difficulty breathing (e.g., speaks in phrases, SOB even at rest, pulse 100-120) AND [2] NEW-onset or WORSE than normal  Answer Assessment - Initial Assessment Questions 1. RESPIRATORY STATUS: "Describe your breathing?" (e.g., wheezing, shortness of breath, unable to speak, severe coughing)      Unable to speak 2. ONSET: "When did this breathing problem begin?"      May 7th  3. PATTERN "Does the difficult breathing come and go, or has it been constant since it started?"      With face mask and talking 4. SEVERITY: "How bad is your breathing?" (e.g., mild, moderate, severe)    - MILD: No SOB at rest, mild SOB with walking, speaks normally in sentences, can lay down, no retractions, pulse < 100.    - MODERATE: SOB at rest, SOB with minimal exertion and prefers to sit, cannot lie down flat, speaks in phrases, mild retractions, audible wheezing, pulse 100-120.    - SEVERE: Very SOB at rest, speaks in single words, struggling to breathe, sitting hunched forward, retractions, pulse > 120     struggling to take  5. RECURRENT SYMPTOM: "Have you had difficulty breathing before?" If Yes, ask: "When was the last time?" and "What happened that time?"     With COVID 6. CARDIAC HISTORY: "Do you have any history of heart disease?" (e.g., heart attack, angina, bypass surgery, angioplasty)      no 7. LUNG HISTORY: "Do you have any  history of lung disease?"  (e.g., pulmonary embolus, asthma, emphysema)     asthma 8. CAUSE: "What do you think is causing the breathing problem?"     COVID with pneumia 9. OTHER SYMPTOMS: "Do you have any other symptoms? (e.g., dizziness, runny nose, cough, chest pain, fever)    no 10. PREGNANCY: "Is there any chance you are pregnant?" "When was your last menstrual period?"     No on depo 11. TRAVEL: "Have you traveled out of the country in the last month?" (e.g., travel history, exposures)      N/A  Protocols used: BREATHING DIFFICULTY-A-AH

## 2020-01-26 ENCOUNTER — Telehealth: Payer: Self-pay | Admitting: Physical Therapy

## 2020-01-26 ENCOUNTER — Ambulatory Visit: Payer: Medicaid Other | Admitting: Physical Therapy

## 2020-01-26 ENCOUNTER — Emergency Department (HOSPITAL_COMMUNITY)
Admission: EM | Admit: 2020-01-26 | Discharge: 2020-01-26 | Disposition: A | Discharge status: Home / Self Care | Payer: Medicaid Other | Attending: Emergency Medicine | Admitting: Emergency Medicine

## 2020-01-26 DIAGNOSIS — R0602 Shortness of breath: Secondary | ICD-10-CM

## 2020-01-26 DIAGNOSIS — Z8616 Personal history of COVID-19: Secondary | ICD-10-CM

## 2020-01-26 DIAGNOSIS — R000 Tachycardia, unspecified: Secondary | ICD-10-CM

## 2020-01-26 LAB — CBC WITH DIFFERENTIAL/PLATELET
Abs Immature Granulocytes: 0.03 10*3/uL (ref 0.00–0.07)
Basophils Absolute: 0 10*3/uL (ref 0.0–0.1)
Basophils Relative: 1 %
Eosinophils Absolute: 0 10*3/uL (ref 0.0–0.5)
Eosinophils Relative: 1 %
HCT: 44.2 % (ref 36.0–46.0)
Hemoglobin: 15.2 g/dL — ABNORMAL HIGH (ref 12.0–15.0)
Immature Granulocytes: 1 %
Lymphocytes Relative: 48 %
Lymphs Abs: 3.3 10*3/uL (ref 0.7–4.0)
MCH: 29.8 pg (ref 26.0–34.0)
MCHC: 34.4 g/dL (ref 30.0–36.0)
MCV: 86.7 fL (ref 80.0–100.0)
Monocytes Absolute: 0.4 10*3/uL (ref 0.1–1.0)
Monocytes Relative: 6 %
Neutro Abs: 2.8 10*3/uL (ref 1.7–7.7)
Neutrophils Relative %: 43 %
Platelets: 337 10*3/uL (ref 150–400)
RBC: 5.1 MIL/uL (ref 3.87–5.11)
RDW: 12 % (ref 11.5–15.5)
WBC: 6.6 10*3/uL (ref 4.0–10.5)
nRBC: 0 % (ref 0.0–0.2)

## 2020-01-26 LAB — COMPREHENSIVE METABOLIC PANEL
ALT: 18 U/L (ref 0–44)
AST: 15 U/L (ref 15–41)
Albumin: 4.1 g/dL (ref 3.5–5.0)
Alkaline Phosphatase: 89 U/L (ref 38–126)
Anion gap: 11 (ref 5–15)
BUN: 16 mg/dL (ref 6–20)
CO2: 18 mmol/L — ABNORMAL LOW (ref 22–32)
Calcium: 9 mg/dL (ref 8.9–10.3)
Chloride: 105 mmol/L (ref 98–111)
Creatinine, Ser: 0.58 mg/dL (ref 0.44–1.00)
GFR calc Af Amer: >60 mL/min (ref >60)
GFR calc non Af Amer: >60 mL/min (ref >60)
Glucose, Bld: 227 mg/dL — ABNORMAL HIGH (ref 70–99)
Potassium: 3.9 mmol/L (ref 3.5–5.1)
Sodium: 134 mmol/L — ABNORMAL LOW (ref 135–145)
Total Bilirubin: 0.3 mg/dL (ref 0.3–1.2)
Total Protein: 7.1 g/dL (ref 6.5–8.1)

## 2020-01-26 MED ORDER — CALCIUM CARBONATE ANTACID 500 MG PO CHEW
2.0000 | CHEWABLE_TABLET | Freq: Once | ORAL | Status: AC
  Administered 2020-01-26: 400 mg via ORAL
  Filled 2020-01-26: qty 2

## 2020-01-26 NOTE — Discharge Instructions (Addendum)
Thank you for allowing me to care for you today in the Emergency Department.   Continue to take your home medications as prescribed, especially your nebulizer.  Please follow closely with primary care and pulmonology.  I have also given you a referral to the COVID-19 clinic.  Please call them to schedule a follow-up appointment.  Return to the emergency department if you develop worsening shortness of breath, if you pass out, if you develop chest pain or shortness of breath accompanied by fever, if your fingers or toes turn blue, if you develop worsening shortness of breath with dizziness or lightheadedness, or other new, concerning symptoms.

## 2020-01-26 NOTE — Telephone Encounter (Signed)
LVM on pt's phone in regards to her missed 9:15am appointment.  Reinforced to pt to cancel her appointment if she has any issues. Reminded her of her next appointment on 8/26 at 9:15am.   Altariq Goodall April Dell Ponto, PT, DPT

## 2020-01-26 NOTE — ED Provider Notes (Signed)
[facility]-EMERGENCY DEPT Provider Note   CSN: [id number] Arrival date & time: 01/25/20  1315     History Chief Complaint  Patient presents with  . Shortness of Breath    Sarah Garrison is a 39 y.o. female with a history of COVID-19 in 4/21 with post virus complications, DM Type II, seizures, anxiety, asthma, HTN, GERD who presents to the emergency department with a chief complaint of shortness of breath.  The patient states that she has been short of breath since she was diagnosed with COVID-19 in April.  She had a 4-day hospitalization and was discharged home without supplemental oxygen.  Patient was noted on discharge summary to have ambulatory shortness of breath at baseline.   She reports that shortness of breath is not worse today, but it is not improved.  She notes that wearing a mask, laying flat, exertion, and talking causes her shortness of breath to be worse, but notes that this has been ongoing since COVID-19 infection.  She also continues to have a nonproductive cough, which has been chronic since April.  She also reports that since she was diagnosed with Covid that her heart rate has been faster, even at rest, at every appointment that she has been to since discharge.  She denies chest pain, leg swelling, back pain, wheezing, dizziness, lightheadedness, PND, abdominal pain, nausea, vomiting, diarrhea.  The patient reports that she called primary care earlier today to schedule a follow-up appointment for her continued symptoms because they were not improving.  She was unable to schedule an appointment and based on RN triage protocols was advised to go to the ER for further evaluation.   Patient's medical care team includes family medicine, pulmonology, endocrinology, and orthopedics.  She is currently undergoing PT for right hip and right shoulder pain.  She is treated her symptoms by using her home nebulizer 3 times earlier today. No personal or familial  history of PE.  She is on Depo Provera for birth control.  No OCPs.  No recent surgery, immobilization, or long travel.  The history is provided by the patient and medical records. No language interpreter was used.       Past Medical History:  Diagnosis Date  . Asthma   . COVID-19   . Diabetes mellitus without complication (HCC)   . GERD (gastroesophageal reflux disease)   . Hypertension   . Seizures [facility])     Patient Active Problem List   Diagnosis Date Noted  . Hip pain 12/08/2019  . Hyperlipidemia associated with type 2 diabetes mellitus (HCC) 11/23/2019  . History of COVID-19 10/27/2019  . Stuttering 10/27/2019  . Depression with anxiety 10/27/2019  . Obesity, Class III, BMI 40-49.9 (morbid obesity) (HCC) 06/30/2019  . OSA (obstructive sleep apnea) 02/16/2019  . Essential hypertension 06/22/2018  . Uncontrolled type 2 diabetes mellitus with complication, with long-term current use of insulin (HCC) 12/13/2016  . Asthma 07/30/2016  . 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/07/2016  . DM neuropathy, type II diabetes mellitus (HCC) 08/24/2015    Past Surgical History:  Procedure Laterality Date  . CESAREAN SECTION    . HERNIA REPAIR    . TUBAL LIGATION    . VENTRAL HERNIA REPAIR N/A 11/27/2017   Procedure: VENTRAL HERNIA REPAIR ERAS PATHWAY;  Surgeon: Harriette Bouillon, MD;  Location: [facility];  Service: General;  Laterality: N/A;  . WISDOM TOOTH EXTRACTION       OB History    Gravida  5   Para  4   Term      Preterm      AB  1   Living  4     SAB  1   TAB      Ectopic      Multiple      Live Births              Family History  Problem Relation Age of Onset  . Asthma Mother   . Kidney failure Mother   . Brain cancer Mother   . Asthma Father   . Other Father        surgery on stomach but don't know from what  . Diabetes Brother   . Diabetes Maternal Grandmother     Social History   Tobacco Use  . Smoking status: Former Smoker     Packs/day: 0.10    Types: Cigarettes    Quit date: 11/27/2017    Years since quitting: 2.1  . Smokeless tobacco: Never Used  Vaping Use  . Vaping Use: Never used  Substance Use Topics  . Alcohol use: No  . Drug use: No    Home Medications Prior to Admission medications   Medication Sig Start Date End Date Taking? Authorizing Provider  acetaminophen (TYLENOL) 500 MG tablet Take 1 tablet (500 mg total) by mouth every 6 (six) hours as needed. 10/09/19   Pokhrel, Rebekah Chesterfield, MD  albuterol (VENTOLIN HFA) 108 (90 Base) MCG/ACT inhaler Inhale 1-2 puffs into the lungs every 6 (six) hours as needed for wheezing or shortness of breath. 01/01/19   Storm Frisk, MD  ARIPiprazole (ABILIFY) 5 MG tablet Take 1 tablet (5 mg total) by mouth daily. 10/27/19   Sater, Pearletha Furl, MD  atorvastatin (LIPITOR) 20 MG tablet Take 1 tablet (20 mg total) by mouth daily. 11/23/19   Storm Frisk, MD  Cetirizine HCl 10 MG CAPS Take 1 capsule (10 mg total) by mouth daily. 10/09/19 11/08/19  Pokhrel, Rebekah Chesterfield, MD  Continuous Blood Gluc Sensor (DEXCOM G6 SENSOR) MISC SMARTSIG:1 Topical Every 10 Days 12/03/19   [provider]  Continuous Blood Gluc Transmit (DEXCOM G6 TRANSMITTER) MISC USE TO CHECK BLOOD SUGAR EVERY 3 MONTHS 10/16/19   [provider]  cyclobenzaprine (FLEXERIL) 10 MG tablet Take 1 tablet (10 mg total) by mouth 3 (three) times daily as needed for muscle spasms. May take 1/2 tab if causes excessive drowsiness 10/02/19   Claiborne Rigg, NP  fluticasone-salmeterol (ADVAIR HFA) 115-21 MCG/ACT inhaler Inhale 2 puffs into the lungs 2 (two) times daily. 11/23/19   Storm Frisk, MD  ibuprofen (ADVIL) 600 MG tablet Take 1 tablet (600 mg total) by mouth every 8 (eight) hours as needed. Patient not taking: Reported on 12/08/2019 11/23/19   Storm Frisk, MD  insulin aspart (NOVOLOG) 100 UNIT/ML injection USE VIA INSULIN PUMP. MAX TOTAL DAILY DOSE OF 200 UNITS 11/23/19   [provider]  Insulin  Disposable Pump (OMNIPOD DASH 5 PACK PODS) MISC Take by mouth every other day. 10/16/19   [provider]  Insulin Human (INSULIN PUMP) SOLN Inject 1 each into the skin See admin instructions. Medication: Novolog 100 units/ml injection. Takes 100 units via pump per patient.    [provider]  Lactulose 20 GM/30ML SOLN Take 30 mLs (20 g total) by mouth at bedtime. For 3 nights then as needed for constipation 10/23/19   Fulp, Cammie, MD  losartan (COZAAR) 50 MG tablet Take 1 tablet (50 mg total) by mouth daily.  11/23/19   Storm Frisk, MD  Misc. Devices MISC CPAP therapy on autopap 5-15.  Needs Small size [employer] Full Face Mask Simplus  mask and heated humidification. 09/13/19   Claiborne Rigg, NP  nystatin (MYCOSTATIN) 100000 UNIT/ML suspension Take 5 mLs by mouth 4 (four) times daily. 10/13/19   [provider]  pantoprazole (PROTONIX) 40 MG tablet Take 1 tablet (40 mg total) by mouth daily. 10/23/19   Fulp, Cammie, MD  predniSONE (STERAPRED UNI-PAK 21 TAB) 5 MG (21) TBPK tablet Take as directed 12/16/19   Cristie Hem, PA-C  sertraline (ZOLOFT) 50 MG tablet Take 1 tablet (50 mg total) by mouth daily. 11/23/19   Storm Frisk, MD  TRUEPLUS PEN NEEDLES 32G X 4 MM MISC USE 3 TIMES DAILY AS DIRECTED 06/16/18   Hoy Register, MD  VITAMIN D PO Take 1 tablet by mouth 2 (two) times daily.    [provider]    Allergies    Morphine and related, Glyburide, Ivp dye [iodinated diagnostic agents], Oxycodone, and Vicodin [hydrocodone-acetaminophen]  Review of Systems   Review of Systems  Constitutional: Negative for activity change, chills and fever.  HENT: Negative for congestion and sore throat.   Respiratory: Positive for cough and shortness of breath. Negative for chest tightness and wheezing.   Cardiovascular: Negative for chest pain, palpitations and leg swelling.  Gastrointestinal: Negative for abdominal pain, diarrhea, nausea and vomiting.    Genitourinary: Negative for dysuria and pelvic pain.  Musculoskeletal: Positive for arthralgias. Negative for back pain, gait problem, myalgias, neck pain and neck stiffness.  Skin: Negative for rash.  Allergic/Immunologic: Negative for immunocompromised state.  Neurological: Negative for dizziness, syncope, weakness, light-headedness, numbness and headaches.  Psychiatric/Behavioral: Negative for confusion.    Physical Exam Updated Vital Signs BP 111/66 (BP Location: Right Arm)   Pulse 99   Temp 98.5 F (36.9 C) (Oral)   Resp 17   Ht 5\' 8"  (1.727 m)   Wt 113.9 kg   SpO2 97%   BMI 38.16 kg/m   Physical Exam Vitals and nursing note reviewed.  Constitutional:      General: She is not in acute distress.    Appearance: She is obese.     Comments: Anxious appearing.   HENT:     Head: Normocephalic.  Eyes:     Extraocular Movements: Extraocular movements intact.     Conjunctiva/sclera: Conjunctivae normal.     Pupils: Pupils are equal, round, and reactive to light.  Cardiovascular:     Rate and Rhythm: Regular rhythm. Tachycardia present.     Heart sounds: No murmur heard.  No friction rub. No gallop.      Comments: Peripheral pulses are 2+ and symmetric. Pulmonary:     Effort: Pulmonary effort is normal. No respiratory distress.     Comments: Lungs are clear to auscultation bilaterally.  Patient initially wearing a mask with 1-2 word dyspnea.  However, when mask is removed dyspnea resolves without treatment.  No accessory muscle use, retractions, or nasal flaring. Chest:     Chest wall: No tenderness.  Abdominal:     General: There is no distension.     Palpations: Abdomen is soft. There is no mass.     Tenderness: There is no abdominal tenderness. There is no right CVA tenderness, left CVA tenderness, guarding or rebound.     Hernia: No hernia is present.     Comments: Abdomen is soft, nondistended, nontender.  Musculoskeletal:     Cervical  back: Neck supple.     Right  lower leg: No edema.     Left lower leg: No edema.     Comments: No tenderness to palpation of the bilateral calves.  No edema.  No redness or warmth.  Skin:    General: Skin is warm.     Capillary Refill: Capillary refill takes less than 2 seconds.     Findings: No rash.  Neurological:     General: No focal deficit present.     Mental Status: She is alert.  Psychiatric:        Behavior: Behavior normal.     ED Results / Procedures / Treatments   Labs (all labs ordered are listed, but only abnormal results are displayed) Labs Reviewed  GLUCOSE, CAPILLARY - Abnormal; Notable for the following components:      Result Value   Glucose-Capillary 219 (*)    All other components within normal limits  CBC WITH DIFFERENTIAL/PLATELET - Abnormal; Notable for the following components:   Hemoglobin 15.2 (*)    All other components within normal limits  COMPREHENSIVE METABOLIC PANEL - Abnormal; Notable for the following components:   Sodium 134 (*)    CO2 18 (*)    Glucose, Bld 227 (*)    All other components within normal limits  CBG MONITORING, ED - Abnormal; Notable for the following components:   Glucose-Capillary 324 (*)    All other components within normal limits    EKG None  Radiology DG Chest 1 View  Result Date: 01/25/2020 CLINICAL DATA:  Shortness of breath EXAM: CHEST  1 VIEW COMPARISON:  Oct 05, 2019 FINDINGS: The lungs are clear. The heart size and pulmonary vascularity are normal. No adenopathy. No bone lesions. IMPRESSION: Lungs clear.  Cardiac silhouette within normal limits. Electronically Signed   By: Bretta BangWilliam  Woodruff III M.D.   On: 01/25/2020 14:31    Procedures Procedures (including critical care time)  Medications Ordered in ED Medications  calcium carbonate (TUMS - dosed in mg elemental calcium) chewable tablet 400 mg of elemental calcium (400 mg of elemental calcium Oral Given 01/26/20 0510)    ED Course  I have reviewed the triage vital signs and the  nursing notes.  Pertinent labs & imaging results that were available during my care of the patient were reviewed by me and considered in my medical decision making (see chart for details).    MDM Rules/Calculators/A&P                          39 year old female with a history of COVID-19 in 4/21 with post virus complications, DM Type II, seizures, anxiety, asthma, HTN, GERD presenting with chronic shortness of breath, cough, and tachycardia since being diagnosed with COVID-19 in April 2021.  Mildly tachycardic in the low 100s on arrival to the ER.  Per chart review, patient has had mild tachypnea documented in her medical record before and after COVID-19 diagnosis.  She has no hypoxia on room air with sats 96-99%.  When she is wearing her mask, she appears tachypneic and dyspneic, but this resolved spontaneously without treatment other than removing her mask.  Normotensive.   States that her only reason for coming to the ER today was that she was advised when she called her PCP's office.  Patient is adamant that her symptoms have been present for months and are not worse today.  Chest x-ray is unremarkable and on my evaluation there is no evidence  of consolidation.  She adamantly denies chest pain at this time.  CBG initially 324 -->219.  On my initial evaluation, patient has not been waiting for many hours.  Reports that she is tired and would like to go home as she does not feel that her symptoms are worse and is declining blood work.  The patient, work up and plan were discussed with Dr. Clayborne Dana given patient's persistent tachycardia.  I suspect this is related to COVID-19 diagnosis.  No repeat testing is indicated given that she does not have new or worsening symptoms.  She did have her thyroid checked by primary care in June and labs were normal.  Aside from tachycardia, patient is PERC negative.  She does not take estrogen containing OCPs and tested positive for COVID-19 more than 4 months ago.   Per chart review, patient also has an allergy to IV dye and VQ scan is unavailable at this time.  Initially, patient was tachycardic in the 120s on my evaluation.  However, after shared decision-making conversation, she was agreeable to basic labs.  No metabolic derangements.  No evidence of DKA or HHS.  Patient is now been observed for more than 16 hours in the emergency department and is stable.  Shared decision-making conversation.  Discussed that VQ scan is unavailable at this time, but other than tachycardia, which has been persistent for months, she is at low risk for PE.  Patient continues to report that she feels at her baseline since her COVID-19 diagnosis and would like to go home.  I will provide her with a referral to the long Covid clinic.  Discussed that a PE was not ruled out during this visit, but that she is low risk other than her COVID-19 diagnosis.  We extensively discussed that she should return to the ER for new or worsening symptoms, and patient is in agreement and reports that she lives close to the ER.  I think this is a reasonable plan given that I performed a 2-minute walk test with the patient and oxygen saturation was maintained at 97-99% on RA.  She was advised to continue her home medications.  Doubt ACS, occult pneumonia, aortic dissection.  She is hemodynamically stable and in no acute distress.  Safe for discharge to home with outpatient follow-up as indicated.  Final Clinical Impression(s) / ED Diagnoses Final diagnoses:  History of COVID-19  Chronic shortness of breath  Chronic tachycardia    Rx / DC Orders ED Discharge Orders    None       Barkley Boards, PA-C 01/26/20 0521    Mesner, Barbara Cower, MD 01/26/20 1610

## 2020-01-27 ENCOUNTER — Ambulatory Visit (INDEPENDENT_AMBULATORY_CARE_PROVIDER_SITE_OTHER): Payer: Medicaid Other | Admitting: Orthopaedic Surgery

## 2020-01-27 ENCOUNTER — Encounter: Payer: Self-pay | Admitting: Orthopaedic Surgery

## 2020-01-27 DIAGNOSIS — G8929 Other chronic pain: Secondary | ICD-10-CM

## 2020-01-27 DIAGNOSIS — M25511 Pain in right shoulder: Secondary | ICD-10-CM | POA: Diagnosis not present

## 2020-01-27 DIAGNOSIS — M25551 Pain in right hip: Secondary | ICD-10-CM | POA: Diagnosis not present

## 2020-01-27 NOTE — Addendum Note (Signed)
Addended by: Mayra Reel on: 01/27/2020 06:48 PM   Modules accepted: Level of Service

## 2020-01-27 NOTE — Progress Notes (Signed)
Office Visit Note   Patient: Sarah Garrison           Date of Birth: 1981-05-04           MRN: [id number] Visit Date: 01/27/2020              Requested by: Cain Saupe, MD [address] [place],  [place] [address] PCP: Cain Saupe, MD   Assessment & Plan: Visit Diagnoses:  1. Chronic right shoulder pain   2. Pain in right hip     Plan: Impression is right shoulder AC joint pain and right hip trochanteric bursitis and possible labral pathology.  The patient is very hesitant to proceed with cortisone injections but would like to follow-up with Dr. Prince Rome to try an [facility] joint injection to the right shoulder.  She will follow up with [place] as needed.  In the meantime, she will continue with physical therapy.  She will take NSAIDs as needed.  Follow-Up Instructions: Return for Dr. Prince Rome for right shoulder AC joint injection.   Orders:  No orders of the defined types were placed in this encounter.  No orders of the defined types were placed in this encounter.     Procedures: No procedures performed   Clinical Data: No additional findings.   Subjective: Chief Complaint  Patient presents with  . Right Shoulder - Pain  . Right Hip - Pain    HPI patient is a 39 year old who comes in today for follow-up of her right hip and right shoulder.  She has had pain here for several months now.  We saw her about 6 weeks ago for this.  Her pain was out of proportion at that point.  Previous x-rays of the right hip showed mild degenerative changes.  She has been in physical therapy and on a steroid pack over the past few weeks.  She has noted slight relief with these.  In regards to the shoulder, the pain she has is primarily to the [facility] joint.  This is worse with any lifting to the right upper extremity.  In regards to the hip, she is no longer having pain to the groin or anterior thigh but does note pain to the lateral hip.  Putting pressure on her leg while walking seems to aggravate her  symptoms most.  Review of Systems as detailed in HPI.  All others reviewed and are negative.   Objective: Vital Signs: There were no vitals taken for this visit.  Physical Exam well-developed well-nourished female no acute distress.  Alert oriented x3.  Ortho Exam examination of her right shoulder reveals significant tenderness to the [facility] joint.  She has approximately 75% range of motion with forward flexion and abduction.  She can internally rotate to her back pocket.  Minimally positive empty can.  Positive cross body.  Examination of her right hip reveals a negative logroll.  Positive FADIR.  Moderate tenderness to the greater trochanter.  Negative straight leg raise.  No focal weakness.  She is neurovascular intact distally.  Specialty Comments:  No specialty comments available.  Imaging: No new imaging   PMFS History: Patient Active Problem List   Diagnosis Date Noted  . Hip pain 12/08/2019  . Hyperlipidemia associated with type 2 diabetes mellitus (HCC) 11/23/2019  . History of COVID-19 10/27/2019  . Stuttering 10/27/2019  . Depression with anxiety 10/27/2019  . Obesity, Class III, BMI 40-49.9 (morbid obesity) (HCC) 06/30/2019  . OSA (obstructive sleep apnea) 02/16/2019  . Essential hypertension 06/22/2018  .  Uncontrolled type 2 diabetes mellitus with complication, with long-term current use of insulin (HCC) 12/13/2016  . Asthma 07/30/2016  . 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/07/2016  . DM neuropathy, type II diabetes mellitus (HCC) 08/24/2015   Past Medical History:  Diagnosis Date  . Asthma   . COVID-19   . Diabetes mellitus without complication (HCC)   . GERD (gastroesophageal reflux disease)   . Hypertension   . Seizures (HCC)     Family History  Problem Relation Age of Onset  . Asthma Mother   . Kidney failure Mother   . Brain cancer Mother   . Asthma Father   . Other Father        surgery on stomach but don't know from what  . Diabetes Brother   . Diabetes  Maternal Grandmother     Past Surgical History:  Procedure Laterality Date  . CESAREAN SECTION    . HERNIA REPAIR    . TUBAL LIGATION    . VENTRAL HERNIA REPAIR N/A 11/27/2017   Procedure: VENTRAL HERNIA REPAIR ERAS PATHWAY;  Surgeon: Harriette Bouillon, MD;  Location: [facility];  Service: General;  Laterality: N/A;  . WISDOM TOOTH EXTRACTION     Social History   Occupational History  . Not on file  Tobacco Use  . Smoking status: Former Smoker    Packs/day: 0.10    Types: Cigarettes    Quit date: 11/27/2017    Years since quitting: 2.1  . Smokeless tobacco: Never Used  Vaping Use  . Vaping Use: Never used  Substance and Sexual Activity  . Alcohol use: No  . Drug use: No  . Sexual activity: Yes    Birth control/protection: Surgical

## 2020-01-28 ENCOUNTER — Encounter: Payer: Self-pay | Admitting: Physical Therapy

## 2020-01-28 ENCOUNTER — Other Ambulatory Visit: Payer: Self-pay

## 2020-01-28 ENCOUNTER — Ambulatory Visit: Payer: Medicaid Other | Admitting: Physical Therapy

## 2020-01-28 DIAGNOSIS — R498 Other voice and resonance disorders: Secondary | ICD-10-CM | POA: Diagnosis not present

## 2020-01-28 DIAGNOSIS — R2689 Other abnormalities of gait and mobility: Secondary | ICD-10-CM

## 2020-01-28 DIAGNOSIS — M2560 Stiffness of unspecified joint, not elsewhere classified: Secondary | ICD-10-CM

## 2020-01-28 DIAGNOSIS — G8929 Other chronic pain: Secondary | ICD-10-CM

## 2020-01-28 NOTE — Therapy (Addendum)
[facility] [address] [place], [place], [address] Phone: [phone]   Fax:  [phone]  Physical Therapy Treatment  Patient Details  Name: Sarah Garrison MRN: [id number] Date of Birth: [birth year] Referring Provider (PT): Cristie Hem, [place]   Encounter Date: 01/28/2020   PT End of Session - 01/28/20 0907    Visit Number 6    Number of Visits 12    Date for PT Re-Evaluation 02/19/20    Authorization Type MED PAY ASSURANCE; [place] MEDICAID HEALTHY BLUE    Authorization Time Period 01/01/20-01/22/20    Authorization - Visit Number 5    PT Start Time 0910    PT Stop Time 0955    PT Time Calculation (min) 45 min    Activity Tolerance Patient tolerated treatment well;Patient limited by pain    Behavior During Therapy [facility] for tasks assessed/performed           Check all possible CPT codes:      []  97110 (Therapeutic Exercise)  []  92507 (SLP Treatment)  []  97112 (Neuro Re-ed)   []  92526 (Swallowing Treatment)   []  97116 (Gait Training)   []  [phone] (Cognitive Training, 1st 15 minutes) []  97140 (Manual Therapy)   []  97130 (Cognitive Training, each add'l 15 minutes)  []  97530 (Therapeutic Activities)  []  Other, List CPT Code ____________    []  97535 (Self Care)       [x]  All codes above (97110 - 97535)  [x]  97012 (Mechanical Traction)  [x]  97014 (E-stim Unattended)  []  97032 (E-stim manual)  [x]  97033 (Ionto)  [x]  97035 (Ultrasound)  []  97016 (Vaso)  []  97760 (Orthotic Fit) []  (Prosthetic Training) []  (Physical Performance Training) []  (Aquatic Therapy) []  (Canalith Repositioning) []  (Contrast Bath) []  01093 (Paraffin) []  97597 (Wound Care 1st 20 sq cm) []  97598 (Wound Care each add'l 20 sq cm)      Past Medical History:  Diagnosis Date  . Asthma   . COVID-19   . Diabetes mellitus without complication (HCC)   . GERD (gastroesophageal reflux disease)   . Hypertension   . Seizures  (HCC)     Past Surgical History:  Procedure Laterality Date  . CESAREAN SECTION    . HERNIA REPAIR    . TUBAL LIGATION    . VENTRAL HERNIA REPAIR N/A 11/27/2017   Procedure: VENTRAL HERNIA REPAIR ERAS PATHWAY;  Surgeon: , MD;  Location: [facility];  Service: General;  Laterality: N/A;  . WISDOM TOOTH EXTRACTION      There were no vitals filed for this visit.   Subjective Assessment - 01/28/20 0926    Subjective Pt reports she was in the hospital due to a small blood clot. Pt states she's been doing the exercises daily. Pt states she is to get an [facility] joint shot in her R shoulder. Pt states she got an IV in her hand and has caused increased swelling and pain.    Pertinent History Covid !(    Limitations Sitting;Lifting;Standing;Walking;House hold activities    How long can you sit comfortably? no issue    How long can you stand comfortably? 10-15 mins    How long can you walk comfortably? Unable    Diagnostic tests Hips 09/05/19:FINDINGS:Frontal view of the pelvis as well as frontal and frogleg lateral views of the right hip are obtained. No acute displaced fracture.Mild symmetrical bilateral hip joint space narrowing compatible with osteoarthritis. Soft tissues are unremarkable.  Patient Stated Goals For my pain to get better    Currently in Pain? Yes    Pain Score 7     Pain Location Shoulder    Pain Orientation Right    Pain Descriptors / Indicators Aching;Throbbing;Tender    Pain Type Acute pain;Chronic pain                             [facility] Adult PT Treatment/Exercise - 01/28/20 0001      Shoulder Exercises: Seated   Flexion Right;AAROM;20 reps    Abduction Right;AAROM;20 reps    Other Seated Exercises pec stretch -- arm on table, turning towards & away x 10      Shoulder Exercises: Standing   External Rotation Strengthening;Right;20 reps    Theraband Level (Shoulder External Rotation) Level 2 (Red)   Attempted green; however,  pt reported increased pain   Internal Rotation Strengthening;Right;20 reps;Theraband    Theraband Level (Shoulder Internal Rotation) Level 3 (Green)    Row Strengthening;Both;Theraband;20 reps    Theraband Level (Shoulder Row) Level 4 (Blue)    Other Standing Exercises shoulder roll x 10    Other Standing Exercises shoulder retraction + chin tuck x 10, hand squeeze x10      Shoulder Exercises: Pulleys   Flexion 1 minute    ABduction 1 minute      Shoulder Exercises: ROM/Strengthening   Wall Wash flexion x 20, abduction x 20      Iontophoresis   Type of Iontophoresis Dexamethasone    Location R wrist    Dose 4mg /ml; 1ml    Time 6 hours      Manual Therapy   Manual therapy comments Self massage with foam roll against wall x 2 min                  PT Education - 01/28/20 1004    Education Details Provided pain education and warning about disuse of UE.    Person(s) Educated Patient    Methods Explanation;Demonstration;Tactile cues;Verbal cues    Comprehension Verbalized understanding;Returned demonstration;Verbal cues required;Tactile cues required            PT Short Term Goals - 01/28/20 1011      PT SHORT TERM GOAL #1   Title Pt will be ind in an initail HEP    Baseline Currently no program    Time 3    Period Weeks    Status On-going    Target Date 01/22/20      PT SHORT TERM GOAL #2   Title Ptwill voice understanding/return demonstration of measure to reduce and manage R hip pain    Time 3    Period Weeks    Status On-going    Target Date 01/22/20      PT SHORT TERM GOAL #3   Title Establish LTGs following the initial 4 visits of PT care    Time 3    Period Weeks    Status Achieved    Target Date 01/22/20      PT SHORT TERM GOAL #4   Title Pt will be independent with self correcting her forward head and rounded shoulder posture    Baseline Needs cueing    Time 3    Period Weeks    Status On-going    Target Date 02/02/20             PT  Long Term Goals - 01/28/20 1012  PT LONG TERM GOAL #1   Title pt will be independent with advanced HEP    Time 4    Period Weeks    Status On-going      PT LONG TERM GOAL #2   Title Pt will have full pain free shoulder ROM    Baseline ~90 deg until pain    Time 4    Period Weeks    Status On-going      PT LONG TERM GOAL #3   Title Pt will be able to lift 10 lbs overhead for home tasks    Baseline Unable    Time 4    Period Weeks    Status On-going      PT LONG TERM GOAL #4   Title Pt will be able to stand comfortably for 30 minutes to be able to perform household chores    Baseline 15-20 minutes    Time 4    Period Weeks    Status On-going      PT LONG TERM GOAL #5   Title Pt will be able to amb around the grocery store without discomfort    Baseline Unable on initial eval    Time 4    Period Weeks    Status On-going                 Plan - 01/28/20 1005    Clinical Impression Statement Pt had been improving pain free shoulder ROM to ~90 deg; however, Pt presents to clinic today with increased pain/tenderness in R arm after getting IV in her hand. Therefore, treatment continues to focus on her shoulder. Encouraged pt to perform gentle hand pumps, provided ionto, and discussed ice and elevation to reduce swelling and guarding. Provided pt pain education due to pt highly guarding hand/wrist/shoulder and high focus on pain. Pt continues to remain very tender to light touch -- able to tolerate self massage with foam roll. Treatment continued to progress pt's shoulder AAROM and AROM. Pt needs constant cueing to reduce shoulder hiking/upper trap use. Increased ROM noted when pt is provided a distraction.    Personal Factors and Comorbidities Time since onset of injury/illness/exacerbation;Comorbidity 3+    Comorbidities Hx of Covid-19, DM, asthma    Examination-Activity Limitations Bathing;Bend;Caring for  Others;Carry;Dressing;Lift;Toileting;Stand;Stairs;Squat;Sleep;Sit;Reach Overhead;Locomotion Level;Transfers    Stability/Clinical Decision Making Evolving/Moderate complexity    Rehab Potential Fair    PT Frequency 2x / week    PT Duration 6 weeks    PT Treatment/Interventions ADLs/Self Care Home Management;Cryotherapy;Electrical Stimulation;Ultrasound;Traction;Moist Heat;Iontophoresis 4mg /ml Dexamethasone;Gait training;Stair training;Functional mobility training;Therapeutic activities;Therapeutic exercise;Manual techniques;Patient/family education;Passive range of motion;Dry needling;Taping;Vasopneumatic Device    PT Next Visit Plan Assess response to HEP. Ionto as needed. Consider dual tasking with her exercises to reduce her focus on pain. Provide exs and modalities to assist in pain reduction/management. Assess R knee as approval is received from MD. Continue to progress shoulder, neck and hip exercises as able.    PT Home Exercise Plan Access Code: - wall wash flexion & abduction, row, shoulder extension, and shoulder ER with tband    Consulted and Agree with Plan of Care Patient           Patient will benefit from skilled therapeutic intervention in order to improve the following deficits and impairments:  Abnormal gait, Difficulty walking, Decreased range of motion, Obesity, Decreased activity tolerance, Pain, Impaired flexibility, Decreased mobility, Decreased strength, Postural dysfunction  Visit Diagnosis: Limited joint range of motion (ROM)  Chronic hip pain, right  Other abnormalities of gait and mobility  Chronic right shoulder pain     Problem List Patient Active Problem List   Diagnosis Date Noted  . Hip pain 12/08/2019  . Hyperlipidemia associated with type 2 diabetes mellitus (HCC) 11/23/2019  . History of COVID-19 10/27/2019  . Stuttering 10/27/2019  . Depression with anxiety 10/27/2019  . Obesity, Class III, BMI 40-49.9 (morbid obesity) (HCC) 06/30/2019   . OSA (obstructive sleep apnea) 02/16/2019  . Essential hypertension 06/22/2018  . Uncontrolled type 2 diabetes mellitus with complication, with long-term current use of insulin (HCC) 12/13/2016  . Asthma 07/30/2016  . 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/07/2016  . DM neuropathy, type II diabetes mellitus [facility]) 08/24/2015    Sarah Garrison April Dell Ponto PT, DPT 01/28/2020, 10:17 AM  [facility] [address] [place], [place], [address] Phone: [phone]   Fax:  [phone]  Name: Sarah Garrison MRN: [id number] Date of Birth: 1980-11-08

## 2020-01-29 ENCOUNTER — Encounter: Payer: Self-pay | Admitting: Family Medicine

## 2020-01-29 ENCOUNTER — Ambulatory Visit: Payer: Self-pay

## 2020-01-29 ENCOUNTER — Ambulatory Visit (INDEPENDENT_AMBULATORY_CARE_PROVIDER_SITE_OTHER): Payer: Medicaid Other | Admitting: Family Medicine

## 2020-01-29 ENCOUNTER — Ambulatory Visit: Payer: Medicaid Other

## 2020-01-29 DIAGNOSIS — F8081 Childhood onset fluency disorder: Secondary | ICD-10-CM

## 2020-01-29 DIAGNOSIS — G8929 Other chronic pain: Secondary | ICD-10-CM

## 2020-01-29 DIAGNOSIS — R498 Other voice and resonance disorders: Secondary | ICD-10-CM | POA: Diagnosis not present

## 2020-01-29 DIAGNOSIS — M25511 Pain in right shoulder: Secondary | ICD-10-CM | POA: Diagnosis not present

## 2020-01-29 DIAGNOSIS — F444 Conversion disorder with motor symptom or deficit: Secondary | ICD-10-CM

## 2020-01-29 NOTE — Therapy (Signed)
[facility] [address] [place], [place], [address] Phone: [phone]   Fax:  [phone]  Speech Language Pathology Treatment  Patient Details  Name: Sarah Garrison MRN: [id number] Date of Birth: 09-11-1980 Referring Provider (SLP): Shan Levans, MD   Encounter Date: 01/29/2020   End of Session - 01/29/20 0931    Visit Number 2    Number of Visits 13    Date for SLP Re-Evaluation 03/07/20    Authorization Type BCBS Medicaid    Authorization Time Period requesting 12 visits 01/07/20    SLP Start Time 0850    SLP Stop Time  0928    SLP Time Calculation (min) 38 min    Activity Tolerance Patient tolerated treatment well           Past Medical History:  Diagnosis Date  . Asthma   . COVID-19   . Diabetes mellitus without complication (HCC)   . GERD (gastroesophageal reflux disease)   . Hypertension   . Seizures (HCC)     Past Surgical History:  Procedure Laterality Date  . CESAREAN SECTION    . HERNIA REPAIR    . TUBAL LIGATION    . VENTRAL HERNIA REPAIR N/A 11/27/2017   Procedure: VENTRAL HERNIA REPAIR ERAS PATHWAY;  Surgeon: Harriette Bouillon, MD;  [facility];  Service: General;  Laterality: N/A;  . WISDOM TOOTH EXTRACTION      There were no vitals filed for this visit.   Subjective Assessment - 01/29/20 0855    Subjective Mondahey hey hey hey it was worr-herse herse herse because I was having a hard -hard time breathing."    Currently in Pain? Yes    Pain Score 9     Pain Location Shoulder    Pain Orientation Right    Pain Descriptors / Indicators Aching;Throbbing;Tender    Pain Type Acute pain                 ADULT SLP TREATMENT - 01/29/20 0904      General Information   Behavior/Cognition Alert;Cooperative;Pleasant mood      Treatment Provided   Treatment provided Cognitive-Linquistic      Cognitive-Linquistic Treatment   Treatment focused on  Voice;Other (comment)   stuttering   Skilled Treatment Pt req'd mod cues initially, faded to min cues for abdominal breathing (AB) at rest. Overall success=35%.  After SLP in conversation with pt (pt-initiated) about the accidnet - and then SLP directed pt back to AB practice she req'd mod-max cues initially faded to mod cues - overall success=20%. Pt with intermittent pain reported in feet and rt hand as session progressed. AB was challenging to monitor for pt due to "my pump tingling when I breathe in" on pt's rt side      Assessment / Recommendations / Plan   Plan Continue with current plan of care      Progression Toward Goals   Progression toward goals Progressing toward goals            SLP Education - 01/29/20 0929    Education Details abdominal breathing    Person(s) Educated Patient    Methods Explanation;Demonstration;Verbal cues    Comprehension Verbalized understanding;Need further instruction;Verbal cues required;Returned demonstration            SLP Short Term Goals - 01/29/20 0933      SLP SHORT TERM GOAL #1   Title Patient will demonstrate abdominal breathing in isolation 90% accuracy over 5  minutes x 2 sessions.    Baseline chest breathing 100%    Time 3    Period Weeks    Status On-going      SLP SHORT TERM GOAL #2   Title Pt will use abdominal breathing in 18/20 sentence responses x2 visits with rare min A.    Baseline chest breathing 100%    Time 3    Period Weeks    Status On-going      SLP SHORT TERM GOAL #3   Title Patient will use compensations for fluency in 5 minutes simple conversation with rare min A x 2 visits.    Baseline not using compensations    Time 3    Period Weeks    Status On-going            SLP Long Term Goals - 01/29/20 6314      SLP LONG TERM GOAL #1   Title Patient will demonstrate abdominal breathing in 10 minutes simple conversation at least 80% accuracy x2 visits.    Baseline chest breathing 100%    Time 6    Period  Weeks    Status On-going      SLP LONG TERM GOAL #2   Title Patient will use fluency compensations in 10 minutes simple conversation independently x 2 visits.    Baseline not using compensations    Time 6    Period Weeks    Status On-going      SLP LONG TERM GOAL #3   Title Patient will report reduced frustration with her communication abilities than prior to ST.    Baseline "very frustrated"    Time 6    Period Weeks    Status On-going            Plan - 01/29/20 0931    Clinical Impression Statement Patient presents with inconsistent articulatory and phonatory repetitions. 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 Pt had "stuttering" errors more frequently with phoneme /h/ but also had starting and stopping on words which orthographically contained the letter h but not the phoneme /h/ (eg/ "hour").  Pattern of errors seems inconsistent with a neurogenic stutter or vocal cord dysfunction. I feel pt would benefit from course of skilled ST for respiratory-based interventions as well as fluency compensations help to improve her speech at the conversation level.    Speech Therapy Frequency 2x / week    Duration --   6 weeks or 13 total visits   Treatment/Interventions Environmental controls;Cueing hierarchy;SLP instruction and feedback;Functional tasks;Compensatory strategies;Patient/family education;Other (comment)   abdominal breathing   Potential to Achieve Goals Good    SLP Home Exercise Plan abdominal breathing provided    Consulted and Agree with Plan of Care Patient           Patient will benefit from skilled therapeutic intervention in order to improve the following deficits and impairments:   Stuttering  Other voice and resonance disorders  Functional voice disorder    Problem List Patient Active Problem List   Diagnosis Date Noted  . Hip pain 12/08/2019  . Hyperlipidemia associated  with type 2 diabetes mellitus (HCC) 11/23/2019  . History of COVID-19 10/27/2019  . Stuttering 10/27/2019  . Depression with anxiety 10/27/2019  . Obesity, Class III, BMI 40-49.9 (morbid obesity) (HCC) 06/30/2019  . OSA (obstructive sleep apnea) 02/16/2019  . Essential hypertension 06/22/2018  . Uncontrolled type 2 diabetes mellitus with complication,  with long-term current use of insulin (HCC) 12/13/2016  . Asthma 07/30/2016  . 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/07/2016  . DM neuropathy, type II diabetes mellitus (HCC) 08/24/2015    [facility] ,MS, CCC-SLP  01/29/2020, 9:33 AM  [facility] [address] [place], [place], [address] Phone: [phone]   Fax:  [phone]   Name: Sarah Garrison MRN: [id number] Date of Birth: 1981/03/08
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
## 2020-01-29 NOTE — Patient Instructions (Signed)
Practice your belly breathing 20 minutes twice a day, while lying down. Put a hand on your chest and one on your belly. The one on your belly should be moving up and down, not the one on your chest.

## 2020-02-02 ENCOUNTER — Ambulatory Visit: Payer: Medicaid Other | Admitting: Speech Pathology

## 2020-02-02 ENCOUNTER — Other Ambulatory Visit: Payer: Self-pay

## 2020-02-02 DIAGNOSIS — F444 Conversion disorder with motor symptom or deficit: Secondary | ICD-10-CM

## 2020-02-02 DIAGNOSIS — R498 Other voice and resonance disorders: Secondary | ICD-10-CM | POA: Diagnosis not present

## 2020-02-02 DIAGNOSIS — F8081 Childhood onset fluency disorder: Secondary | ICD-10-CM

## 2020-02-02 NOTE — Therapy (Signed)
[facility] [address] [place], [place], [address] Phone: [phone]   Fax:  [phone]  Speech Language Pathology Treatment  Patient Details  Name: Sarah Garrison MRN: [id number] Date of Birth: 1981/02/13 Referring Provider (SLP): Shan Levans, MD   Encounter Date: 02/02/2020   End of Session - 02/02/20 1324    Visit Number 3    Number of Visits 13    Date for SLP Re-Evaluation 03/07/20    Authorization Type BCBS Medicaid    Authorization Time Period 12 visits approved through 03/11/20    SLP Start Time 1100    SLP Stop Time  1142    SLP Time Calculation (min) 42 min    Activity Tolerance Patient tolerated treatment well           Past Medical History:  Diagnosis Date   Asthma    COVID-19    Diabetes mellitus without complication (HCC)    GERD (gastroesophageal reflux disease)    Hypertension    Seizures (HCC)     Past Surgical History:  Procedure Laterality Date   CESAREAN SECTION     HERNIA REPAIR     TUBAL LIGATION     VENTRAL HERNIA REPAIR N/A 11/27/2017   Procedure: VENTRAL HERNIA REPAIR ERAS PATHWAY;  Surgeon: Harriette Bouillon, MD;  Location: [facility];  Service: General;  Laterality: N/A;   WISDOM TOOTH EXTRACTION      There were no vitals filed for this visit.   Subjective Assessment - 02/02/20 1105    Subjective "I ha-ha-ha- had seen it."    Currently in Pain? Yes    Pain Score 6     Pain Location Shoulder    Pain Orientation Right    Pain Descriptors / Indicators Aching                 ADULT SLP TREATMENT - 02/02/20 1109      General Information   Behavior/Cognition Alert;Cooperative;Pleasant mood      Treatment Provided   Treatment provided Cognitive-Linquistic      Pain Assessment   Pain Assessment No/denies pain      Cognitive-Linquistic Treatment   Treatment focused on Voice;Other (comment)    Skilled Treatment SLP took pt to  mat for AB in supine/partially reclined; success was ~80% with min-mod cues. Able to progress to phrases with continuous phonation with reduced frequency of stuttering events (glottal or occasionally articulatory), 3 min instances noted over 5 minute period vs every 2-3 utterances in pt's initial conversation with SLP. Afterwards pt sat upright because she reported reflux. In upright position pt conversed with SLP again with rare instances of stutter, 2 mild instances in 10 minute conversation; same when pt answered a phone call from a friend. SLP encouraged pt to continue working on AB at home as well as some personally relevant sentences with continuous phonation.       Assessment / Recommendations / Plan   Plan Continue with current plan of care      Progression Toward Goals   Progression toward goals Progressing toward goals            SLP Education - 02/02/20 1324    Education Details abdominal breathing, continuous phonation    Person(s) Educated Patient    Methods Explanation;Handout    Comprehension Verbalized understanding;Need further instruction;Returned demonstration            SLP Short Term Goals - 02/02/20 1325  SLP SHORT TERM GOAL #1   Title Patient will demonstrate abdominal breathing in isolation 90% accuracy over 5 minutes x 2 sessions.    Baseline chest breathing 100%    Time 2    Period Weeks    Status On-going      SLP SHORT TERM GOAL #2   Title Pt will use abdominal breathing in 18/20 sentence responses x2 visits with rare min A.    Baseline chest breathing 100%    Time 2    Period Weeks    Status On-going      SLP SHORT TERM GOAL #3   Title Patient will use compensations for fluency in 5 minutes simple conversation with rare min A x 2 visits.    Baseline not using compensations    Time 2    Period Weeks    Status On-going            SLP Long Term Goals - 02/02/20 1326      SLP LONG TERM GOAL #1   Title Patient will demonstrate abdominal  breathing in 10 minutes simple conversation at least 80% accuracy x2 visits.    Baseline chest breathing 100%    Time 5    Period Weeks    Status On-going      SLP LONG TERM GOAL #2   Title Patient will use fluency compensations in 10 minutes simple conversation independently x 2 visits.    Baseline not using compensations    Time 5    Period Weeks    Status On-going      SLP LONG TERM GOAL #3   Title Patient will report reduced frustration with her communication abilities than prior to ST.    Baseline "very frustrated"    Time 5    Period Weeks    Status On-going            Plan - 02/02/20 1325    Clinical Impression Statement Patient presents with inconsistent articulatory and phonatory repetitions. 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 Pt had "stuttering" errors more frequently with phoneme /h/ but also had starting and stopping on words which orthographically contained the letter h but not the phoneme /h/ (eg/ "hour"). Minimal stuttering after AB, continuous phonation tasks. Pattern of errors seems inconsistent with a neurogenic stutter or vocal cord dysfunction. I feel pt would benefit from course of skilled ST for respiratory-based interventions as well as fluency compensations help to improve her speech at the conversation level.    Speech Therapy Frequency 2x / week    Duration --   6 weeks or 13 total visits   Treatment/Interventions Environmental controls;Cueing hierarchy;SLP instruction and feedback;Functional tasks;Compensatory strategies;Patient/family education;Other (comment)   abdominal breathing   Potential to Achieve Goals Good    SLP Home Exercise Plan abdominal breathing provided    Consulted and Agree with Plan of Care Patient           Patient will benefit from skilled therapeutic intervention in order to improve the following deficits and impairments:    Stuttering  Other voice and resonance disorders  Functional voice disorder    Problem List Patient Active Problem List   Diagnosis Date Noted   Hip pain 12/08/2019   Hyperlipidemia associated with type 2 diabetes mellitus (HCC) 11/23/2019   History of COVID-19 10/27/2019   Stuttering 10/27/2019   Depression with anxiety 10/27/2019   Obesity, Class III, BMI 40-49.9 (  morbid obesity) (HCC) 06/30/2019   OSA (obstructive sleep apnea) 02/16/2019   Essential hypertension 06/22/2018   Uncontrolled type 2 diabetes mellitus with complication, with long-term current use of insulin (HCC) 12/13/2016   Asthma 07/30/2016   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/07/2016   DM neuropathy, type II diabetes mellitus (HCC) 08/24/2015   Rondel Baton, MS, CCC-SLP Speech-Language Pathologist  Arlana Lindau 02/02/2020, 1:26 PM  [facility] [address] [place], [place], [address] Phone: [phone]   Fax:  [phone]   Name: CATALYNA REILLY MRN: [id number] Date of Birth: [birth year]

## 2020-02-02 NOTE — Patient Instructions (Signed)
Practice your abdominal breathing lying down for 15-20 minutes twice a day.  Work on the flowing speech (keep your voice on without stopping it, almost like singing):   I-want-some-chicken Clean-up-your-room Walk-the-dogs Clean-out-the-trash-can Wash-your-clothes Stop-smoking-them-vapes Don't-drive-that-car We-love-you-long-time

## 2020-02-04 ENCOUNTER — Ambulatory Visit: Payer: Medicaid Other | Admitting: Speech Pathology

## 2020-02-04 ENCOUNTER — Other Ambulatory Visit: Payer: Self-pay | Admitting: Family Medicine

## 2020-02-04 DIAGNOSIS — K219 Gastro-esophageal reflux disease without esophagitis: Secondary | ICD-10-CM

## 2020-02-04 MED ORDER — PANTOPRAZOLE SODIUM 40 MG PO TBEC: 40.0000 mg | DELAYED_RELEASE_TABLET | Freq: Every day | ORAL | 0 refills | Status: DC

## 2020-02-05 ENCOUNTER — Telehealth: Payer: Self-pay | Admitting: *Deleted

## 2020-02-05 ENCOUNTER — Encounter: Payer: Self-pay | Admitting: Physical Therapy

## 2020-02-05 ENCOUNTER — Other Ambulatory Visit: Payer: Self-pay | Admitting: Nurse Practitioner

## 2020-02-05 ENCOUNTER — Other Ambulatory Visit: Payer: Self-pay

## 2020-02-05 ENCOUNTER — Ambulatory Visit: Payer: Medicaid Other | Attending: Physician Assistant | Admitting: Physical Therapy

## 2020-02-05 DIAGNOSIS — M25551 Pain in right hip: Secondary | ICD-10-CM | POA: Diagnosis not present

## 2020-02-05 DIAGNOSIS — G8929 Other chronic pain: Secondary | ICD-10-CM | POA: Insufficient documentation

## 2020-02-05 DIAGNOSIS — R2689 Other abnormalities of gait and mobility: Secondary | ICD-10-CM | POA: Diagnosis present

## 2020-02-05 DIAGNOSIS — R498 Other voice and resonance disorders: Secondary | ICD-10-CM | POA: Diagnosis present

## 2020-02-05 DIAGNOSIS — F444 Conversion disorder with motor symptom or deficit: Secondary | ICD-10-CM | POA: Diagnosis present

## 2020-02-05 DIAGNOSIS — M25511 Pain in right shoulder: Secondary | ICD-10-CM | POA: Diagnosis present

## 2020-02-05 DIAGNOSIS — M2560 Stiffness of unspecified joint, not elsewhere classified: Secondary | ICD-10-CM | POA: Insufficient documentation

## 2020-02-05 DIAGNOSIS — F8081 Childhood onset fluency disorder: Secondary | ICD-10-CM | POA: Insufficient documentation

## 2020-02-05 MED FILL — SERTRALINE HCL 50 MG TABLET: 50 | 30 days supply | Qty: 30 | Fill #1

## 2020-02-05 MED FILL — JANUVIA 100 MG TABLET: 100 | 30 days supply | Qty: 30 | Fill #0

## 2020-02-05 NOTE — Therapy (Signed)
[facility] [address] [place], [place], [address] Phone: [phone]   Fax:  [phone]  Physical Therapy Treatment  Patient Details  Name: Sarah Garrison MRN: [id number] Date of Birth: 16-Jan-1981 Referring Provider (PT): Cristie Hem, [place]   Encounter Date: 02/05/2020   PT End of Session - 02/05/20 0808    Visit Number 7    Number of Visits 12    Date for PT Re-Evaluation 02/19/20    Authorization Type MED PAY ASSURANCE; [place] MEDICAID HEALTHY BLUE    Authorization Time Period 01/01/20-01/22/20, requested additional visits    Authorization - Visit Number 6    PT Start Time 0800    PT Stop Time 0839    PT Time Calculation (min) 39 min           Past Medical History:  Diagnosis Date  . Asthma   . COVID-19   . Diabetes mellitus without complication (HCC)   . GERD (gastroesophageal reflux disease)   . Hypertension   . Seizures (HCC)     Past Surgical History:  Procedure Laterality Date  . CESAREAN SECTION    . HERNIA REPAIR    . TUBAL LIGATION    . VENTRAL HERNIA REPAIR N/A 11/27/2017   Procedure: VENTRAL HERNIA REPAIR ERAS PATHWAY;  Surgeon: Harriette Bouillon, MD;  Location: [facility];  Service: General;  Laterality: N/A;  . WISDOM TOOTH EXTRACTION      There were no vitals filed for this visit.   Subjective Assessment - 02/05/20 0804    Subjective I can move my shoulder more than I used to but it still hurts just a little. I had an injection in my shoulder and once the numbness wore off it was very painful, better now.    Currently in Pain? Yes    Pain Score 6     Pain Location Shoulder    Pain Orientation Right    Pain Descriptors / Indicators Sore    Aggravating Factors  never stops    Pain Relieving Factors ionto    Pain Score 7    Pain Location Hip    Pain Orientation Right    Pain Descriptors / Indicators Throbbing    Pain Type Chronic pain    Aggravating Factors  stairs ,  stepping    Pain Relieving Factors avoid things that hurt              [facility] PT Assessment - 02/05/20 0001      AROM   Right Shoulder Flexion 110 Degrees    Right Shoulder ABduction 100 Degrees    Right Shoulder Internal Rotation --   reach to sacrum   Right Shoulder External Rotation --   reach to T2                        [facility] Adult PT Treatment/Exercise - 02/05/20 0001      Knee/Hip Exercises: Stretches   Hip Flexor Stretch Limitations standing x 2     Gastroc Stretch Limitations runners x 2 each       Knee/Hip Exercises: Aerobic   Nustep L4 x 7 minutes UE/LE      Knee/Hip Exercises: Standing   Heel Raises 10 reps    Heel Raises Limitations 10 reps toe raises    Hip Flexion Limitations alternating step taps facing 4 inch stairs     Forward Step Up Limitations 4ich alteranting step ups at clinic stairs  Stairs up and down 4 inch stairs with bilateral hand rails, reciprocally       Shoulder Exercises: Seated   Other Seated Exercises shoulder horizontal abduction x 10 , ER x 10 red band     Other Seated Exercises --      Shoulder Exercises: Standing   Row Strengthening;Both;Theraband;20 reps    Theraband Level (Shoulder Row) Level 3 (Green)    Other Standing Exercises shoulder roll x 10    Other Standing Exercises shoulder retraction + chin tuck x 10,      Shoulder Exercises: Pulleys   Flexion 1 minute    Scaption Limitations painful      Shoulder Exercises: ROM/Strengthening   Other ROM/Strengthening Exercises standing bilat shoulder flexion wall slides .single arm x 10 abduction slides       Shoulder Exercises: Stretch   Other Shoulder Stretches upper trap stretch bilat x 30 sec each    Other Shoulder Stretches Levator stretch x 30 sec bilat      Manual Therapy   Manual therapy comments able to tolerate light soft tissue work to upper trap                     PT Short Term Goals - 01/28/20 1011      PT SHORT TERM GOAL #1    Title Pt will be ind in an initail HEP    Baseline Currently no program    Time 3    Period Weeks    Status On-going    Target Date 01/22/20      PT SHORT TERM GOAL #2   Title Ptwill voice understanding/return demonstration of measure to reduce and manage R hip pain    Time 3    Period Weeks    Status On-going    Target Date 01/22/20      PT SHORT TERM GOAL #3   Title Establish LTGs following the initial 4 visits of PT care    Time 3    Period Weeks    Status Achieved    Target Date 01/22/20      PT SHORT TERM GOAL #4   Title Pt will be independent with self correcting her forward head and rounded shoulder posture    Baseline Needs cueing    Time 3    Period Weeks    Status On-going    Target Date 02/02/20             PT Long Term Goals - 01/28/20 1012      PT LONG TERM GOAL #1   Title pt will be independent with advanced HEP    Time 4    Period Weeks    Status On-going      PT LONG TERM GOAL #2   Title Pt will have full pain free shoulder ROM    Baseline ~90 deg until pain    Time 4    Period Weeks    Status On-going      PT LONG TERM GOAL #3   Title Pt will be able to lift 10 lbs overhead for home tasks    Baseline Unable    Time 4    Period Weeks    Status On-going      PT LONG TERM GOAL #4   Title Pt will be able to stand comfortably for 30 minutes to be able to perform household chores    Baseline 15-20 minutes    Time 4  Period Weeks    Status On-going      PT LONG TERM GOAL #5   Title Pt will be able to amb around the grocery store without discomfort    Baseline Unable on initial eval    Time 4    Period Weeks    Status On-going                 Plan - 02/05/20 0844    Clinical Impression Statement Pt reports AC joint injection painful at first but can move arm better now with less pain. AROM improved. She was also willing to try manual soft tissue work and tolerate light presure with progressing to light -moderate pressure.  Began 4 inch step ups with pt c/o popping an pain in right hip. Reinforced hurt vs harm principles and pt was able to navigate 4inch stair case in clinic with min difficulty. Pt instructed also in standing anterior hip stretch. OVerall pt tolerated session with seated and standing therex with c/o fatigue throughout session. SHe was able to resume therex after rest breaks.    PT Next Visit Plan Consider dual tasking with her exercises to reduce her focus on pain. Continue low step ups, consider mini squats/sit-stands- repeat manual to right upper trap - hurt vs harm    PT Home Exercise Plan Access Code: [email or web address]- wall wash flexion & abduction, row, shoulder extension, and shoulder ER with tband           Patient will benefit from skilled therapeutic intervention in order to improve the following deficits and impairments:  Abnormal gait, Difficulty walking, Decreased range of motion, Obesity, Decreased activity tolerance, Pain, Impaired flexibility, Decreased mobility, Decreased strength, Postural dysfunction  Visit Diagnosis: Chronic hip pain, right  Other abnormalities of gait and mobility  Chronic right shoulder pain     Problem List Patient Active Problem List   Diagnosis Date Noted  . Hip pain 12/08/2019  . Hyperlipidemia associated with type 2 diabetes mellitus (HCC) 11/23/2019  . History of COVID-19 10/27/2019  . Stuttering 10/27/2019  . Depression with anxiety 10/27/2019  . Obesity, Class III, BMI 40-49.9 (morbid obesity) (HCC) 06/30/2019  . OSA (obstructive sleep apnea) 02/16/2019  . Essential hypertension 06/22/2018  . Uncontrolled type 2 diabetes mellitus with complication, with long-term current use of insulin (HCC) 12/13/2016  . Asthma 07/30/2016  . 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/07/2016  . DM neuropathy, type II diabetes mellitus (HCC) 08/24/2015    Sarah Garrison, PTA 02/05/2020, 8:56 AM  [facility] [address] [place], [place], [address] Phone: [phone]   Fax:  [phone]  Name: Sarah Garrison MRN: [id number] Date of Birth: 1980/06/23

## 2020-02-05 NOTE — Telephone Encounter (Signed)
Calling to follow up with patient.  Please offer appt with Dr. Delford Field.  Left message on voicemail to return call.

## 2020-02-09 ENCOUNTER — Other Ambulatory Visit: Payer: Self-pay

## 2020-02-09 ENCOUNTER — Ambulatory Visit: Payer: No Typology Code available for payment source | Attending: Family Medicine

## 2020-02-09 VITALS — Temp 98.1°F

## 2020-02-09 DIAGNOSIS — Z3042 Encounter for surveillance of injectable contraceptive: Secondary | ICD-10-CM

## 2020-02-09 MED ORDER — MEDROXYPROGESTERONE ACETATE 150 MG/ML IM SUSP
150.0000 mg | Freq: Once | INTRAMUSCULAR | Status: AC
  Administered 2020-02-09: 150 mg via INTRAMUSCULAR

## 2020-02-09 NOTE — Progress Notes (Signed)
Pt is here as instructed by PCP for Depo injections Date last pap: Last Depo-Provera:11/23/2019 Side Effects if KBT:CYEL verbalized Serum HCG indicated?YES/ Negative  Lot HCG [id number] Exp 06/2020   Education given to pt in regard to use extra protection and possible false negative HGC if antibiotic intake or missed intervals. Verbalized understanding Depo-Provera 150 mg IM given in R gluteus Medius   muscle.Injection well tolerated. No reaction noted at the injection site. Explained pt the importance adherence to depo Provera perpetual calendar. Patient education was provided. Next appointmentscheduled for November  25

## 2020-02-10 ENCOUNTER — Encounter: Payer: Self-pay | Admitting: Physical Therapy

## 2020-02-10 ENCOUNTER — Ambulatory Visit: Payer: Medicaid Other

## 2020-02-10 ENCOUNTER — Encounter: Payer: Self-pay | Admitting: Emergency Medicine

## 2020-02-10 ENCOUNTER — Ambulatory Visit: Payer: Medicaid Other | Admitting: Physical Therapy

## 2020-02-10 ENCOUNTER — Ambulatory Visit (INDEPENDENT_AMBULATORY_CARE_PROVIDER_SITE_OTHER): Payer: Medicaid Other | Admitting: Emergency Medicine

## 2020-02-10 DIAGNOSIS — M25551 Pain in right hip: Secondary | ICD-10-CM | POA: Diagnosis not present

## 2020-02-10 DIAGNOSIS — I10 Essential (primary) hypertension: Secondary | ICD-10-CM

## 2020-02-10 DIAGNOSIS — G4733 Obstructive sleep apnea (adult) (pediatric): Secondary | ICD-10-CM

## 2020-02-10 DIAGNOSIS — R498 Other voice and resonance disorders: Secondary | ICD-10-CM

## 2020-02-10 DIAGNOSIS — J4551 Severe persistent asthma with (acute) exacerbation: Secondary | ICD-10-CM

## 2020-02-10 DIAGNOSIS — F8081 Childhood onset fluency disorder: Secondary | ICD-10-CM | POA: Diagnosis not present

## 2020-02-10 DIAGNOSIS — R2689 Other abnormalities of gait and mobility: Secondary | ICD-10-CM

## 2020-02-10 DIAGNOSIS — M25511 Pain in right shoulder: Secondary | ICD-10-CM

## 2020-02-10 DIAGNOSIS — G8929 Other chronic pain: Secondary | ICD-10-CM

## 2020-02-10 DIAGNOSIS — F444 Conversion disorder with motor symptom or deficit: Secondary | ICD-10-CM

## 2020-02-10 DIAGNOSIS — Z8616 Personal history of COVID-19: Secondary | ICD-10-CM

## 2020-02-10 MED ORDER — ALBUTEROL SULFATE HFA 108 (90 BASE) MCG/ACT IN AERS: 2.0000 | INHALATION_SPRAY | RESPIRATORY_TRACT | 5 refills | Status: DC | PRN

## 2020-02-10 NOTE — Assessment & Plan Note (Signed)
She had childhood asthma and now adult asthma diagnosed clinically.  She may be getting some benefit from Advair.  I will give her an albuterol inhaler to use as needed in addition to the Advair.  We will check pulmonary function testing to quantify her degree of obstruction.

## 2020-02-10 NOTE — Therapy (Signed)
[place], [place], [address] Phone: [phone]   Fax:  [phone]  Physical Therapy Treatment  Patient Details  Name: Sarah Garrison MRN: [id number] Date of Birth: [birth year] Referring Provider (PT): Aundra Dubin, [place]   Encounter Date: 02/10/2020   PT End of Session - 02/10/20 0938    Visit Number 8    Number of Visits 12    Date for PT Re-Evaluation 02/19/20    Authorization Type MED PAY ASSURANCE; [place] MEDICAID HEALTHY BLUE    Authorization Time Period 01/01/20-01/22/20, 3 visits from 8/10-9/14/21    Authorization - Visit Number 7    PT Start Time 0931    PT Stop Time 1012    PT Time Calculation (min) 41 min           Past Medical History:  Diagnosis Date  . Asthma   . COVID-19   . Diabetes mellitus without complication ([place])   . GERD (gastroesophageal reflux disease)   . Hypertension   . Seizures ([place])     Past Surgical History:  Procedure Laterality Date  . CESAREAN SECTION    . HERNIA REPAIR    . TUBAL LIGATION    . VENTRAL HERNIA REPAIR N/A 11/27/2017   Procedure: [place];  Surgeon: Erroll Luna, MD;  Location: [place];  Service: General;  Laterality: N/A;  . WISDOM TOOTH EXTRACTION      There were no vitals filed for this visit.   Subjective Assessment - 02/10/20 0958    Subjective Just a little shoulder pain 5/10. Knee hurts when I use the Right leg. The hip hurts a little on the side.    Currently in Pain? Yes    Pain Score 5     Pain Location Shoulder    Pain Orientation Right    Pain Descriptors / Indicators Sore    Pain Type Acute pain              [facility] PT Assessment - 02/10/20 0001      AROM   Right Shoulder Flexion 148 Degrees    Right Shoulder ABduction 112 Degrees    Right Shoulder Internal Rotation --   reach to sacrum   Right Shoulder External Rotation --   reach to T2                         [facility] Adult PT Treatment/Exercise - 02/10/20 0001      Knee/Hip Exercises: Stretches   Gastroc Stretch Limitations slant board x 30 sec       Knee/Hip Exercises: Aerobic   Nustep L4 x 7 minutes UE/LE      Knee/Hip Exercises: Standing   Hip Flexion Limitations alternating step taps facing 6 inch stairs     Forward Step Up Limitations 6 inch step up 10 Rt/Lt bilat UE    1 UE   Functional Squat Limitations 10 reps holding T.M rail     Stairs up and down 6 inch stairs with bilateral hand rails, reciprocally    12 stairs      Shoulder Exercises: Seated   Other Seated Exercises Cervical retraction x 10      Shoulder Exercises: Standing   Horizontal ABduction 10 reps    Theraband Level (Shoulder Horizontal ABduction) Level 2 (Red)    External Rotation Strengthening;Right;20 reps    Theraband Level (Shoulder External Rotation) Level 2 (Red)  Internal Rotation Strengthening;Right;20 reps;Theraband    Theraband Level (Shoulder Internal Rotation) Level 3 (Green)    Row Strengthening;Both;Theraband;20 reps    Theraband Level (Shoulder Row) Level 3 (Green)      Shoulder Exercises: Pulleys   Flexion 2 minutes      Shoulder Exercises: Stretch   Internal Rotation Stretch 3 reps    Internal Rotation Stretch Limitations 30 sec  AAROM using opposite hand                    PT Short Term Goals - 02/10/20 1018      PT SHORT TERM GOAL #1   Title Pt will be ind in an initail HEP    Time 3    Period Weeks    Status Achieved      PT SHORT TERM GOAL #2   Title Ptwill voice understanding/return demonstration of measure to reduce and manage R hip pain    Time 3    Period Weeks    Status On-going      PT SHORT TERM GOAL #3   Title Establish LTGs following the initial 4 visits of PT care    Time 3    Period Weeks    Status Achieved      PT SHORT TERM GOAL #4   Title Pt will be independent with self correcting her forward head and rounded shoulder  posture    Baseline Needs cueing    Time 3    Period Weeks    Status On-going             PT Long Term Goals - 02/10/20 1018      PT LONG TERM GOAL #1   Title pt will be independent with advanced HEP    Time 4    Period Weeks    Status On-going      PT LONG TERM GOAL #2   Title Pt will have full pain free shoulder ROM    Baseline improved, see objective measures    Time 4    Period Weeks    Status On-going      PT LONG TERM GOAL #3   Title Pt will be able to lift 10 lbs overhead for home tasks    Time 4    Period Weeks    Status Unable to assess      PT LONG TERM GOAL #4   Title Pt will be able to stand comfortably for 30 minutes to be able to perform household chores    Baseline 15-20 minutes    Time 4    Period Weeks    Status On-going      PT LONG TERM GOAL #5   Title Pt will be able to amb around the grocery store without discomfort    Time 4    Period Weeks    Status On-going                 Plan - 02/10/20 1011    Clinical Impression Statement Pt demonstrates increased shoulder AROM. Improved tolerance to LE strength via closed chain therex. hurt vs Harm principles used with c/o knee pain at step ups. Able to climb 6 inch stairs reciprocally with railings in clinic with good safety. She reports compliance with HEP. STG# 1 met.    PT Next Visit Plan continue funtional focus reduce pain perseveration,consider dual tasking with her exercises to reduce her focus on pain.  step ups, continue closed chain for LE  therex, continue to progress shoulder ROM and strength,  repeat manual to right upper trap prn, - hurt vs harm    PT Home Exercise Plan Access Code: [email or web address]- wall wash flexion & abduction, row, shoulder extension, and shoulder ER with tband           Patient will benefit from skilled therapeutic intervention in order to improve the following deficits and impairments:  Abnormal gait, Difficulty walking, Decreased range of motion, Obesity,  Decreased activity tolerance, Pain, Impaired flexibility, Decreased mobility, Decreased strength, Postural dysfunction  Visit Diagnosis: Chronic hip pain, right  Other abnormalities of gait and mobility  Chronic right shoulder pain     Problem List Patient Active Problem List   Diagnosis Date Noted  . Hip pain 12/08/2019  . Hyperlipidemia associated with type 2 diabetes mellitus ([place]) 11/23/2019  . History of COVID-19 10/27/2019  . Stuttering 10/27/2019  . Depression with anxiety 10/27/2019  . Obesity, Class III, BMI 40-49.9 (morbid obesity) ([place]) 06/30/2019  . OSA (obstructive sleep apnea) 02/16/2019  . Essential hypertension 06/22/2018  . Uncontrolled type 2 diabetes mellitus with complication, with long-term current use of insulin ([place]) 12/13/2016  . Asthma 07/30/2016  . 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/07/2016  . DM neuropathy, type II diabetes mellitus ([place]) 08/24/2015    Dorene Ar, PTA 02/10/2020, 10:20 AM  [facility] [address] [place], [place], [address] Phone: [phone]   Fax:  [phone]  Name: Sarah Garrison MRN: [id number] Date of Birth: [birth year]

## 2020-02-10 NOTE — Assessment & Plan Note (Signed)
She denies this is a trigger for her progressive dyspnea and her stuttering respiratory pattern.  She was not intubated so cannot describe upper airway lability to this.  Chest x-ray 01/25/2020 reviewed and reassuring without any evidence of interstitial disease.  PFT ordered to evaluate for Covid associated airflow obstruction or restriction.

## 2020-02-10 NOTE — Assessment & Plan Note (Signed)
Agree that she will benefit from CPAP compliance.  Both to treat her obstructive sleep apnea, prevent downstream effects such as secondary pulmonary hypertension but also to prevent a contribution upper airway irritation.

## 2020-02-10 NOTE — Therapy (Signed)
[facility] [address] [place], [place], [address] Phone: [phone]   Fax:  [phone]  Speech Language Pathology Treatment  Patient Details  Name: Sarah Garrison MRN: [id number] Date of Birth: 11-24-1980 Referring Provider (SLP): Shan Levans, MD   Encounter Date: 02/10/2020   End of Session - 02/10/20 1214    Visit Number 4    Number of Visits 13    Date for SLP Re-Evaluation 03/07/20    Authorization Type [employer] Medicaid    Authorization Time Period 12 visits approved through 03/11/20    Authorization - Visit Number 4    Authorization - Number of Visits 12    SLP Start Time 1103    SLP Stop Time  1145    SLP Time Calculation (min) 42 min    Activity Tolerance Patient tolerated treatment well           Past Medical History:  Diagnosis Date  . Asthma   . COVID-19   . Diabetes mellitus without complication (HCC)   . GERD (gastroesophageal reflux disease)   . Hypertension   . Seizures (HCC)     Past Surgical History:  Procedure Laterality Date  . CESAREAN SECTION    . HERNIA REPAIR    . TUBAL LIGATION    . VENTRAL HERNIA REPAIR N/A 11/27/2017   Procedure: VENTRAL HERNIA REPAIR ERAS PATHWAY;  Surgeon: Harriette Bouillon, MD;  Location: [facility];  Service: General;  Laterality: N/A;  . WISDOM TOOTH EXTRACTION      There were no vitals filed for this visit.   Subjective Assessment - 02/10/20 1108    Subjective "Ha - ha- ha it's a new one." (pt: re: her pulmonary MD)    Currently in Pain? Yes    Pain Score 5     Pain Location Shoulder    Pain Orientation Right                 ADULT SLP TREATMENT - 02/10/20 1116      General Information   Behavior/Cognition Alert;Cooperative;Pleasant mood      Treatment Provided   Treatment provided Cognitive-Linquistic      Cognitive-Linquistic Treatment   Treatment focused on Voice;Other (comment)    Skilled Treatment Pt arrived  with stuttering less frequently than last session with this SLP (approx once every utterance). SLP took pt back to PT room to have pt recline and use AB. Pt with 90% success with occasional faded to rare min cues. After 45 seconds, pt yawning so SLP attempted /u/ with good success with relaxed voicing. SLP moved to /h/ plus /u/ and pt began to have what sounded like vocal fold spasm so SLP reverted back to /u/ - SLP switched to /a/ and pt without spasm so moved to /m/ eventually wanting to move to /m/ initial sylllables but pt again developed what appeared to be laryngeal spasm so SLP reverted back to just AB and then progressed pt back to /u/ before session ended. SLP told pt to cont to practice AB at home 15 mintues a day.Pt with back pain at end of session and stutrering frequency incr'd - >one instance every utterance for 4/6 utterances prior to saying goodbye to SLP.       Assessment / Recommendations / Plan   Plan Continue with current plan of care      Progression Toward Goals   Progression toward goals Progressing toward goals  SLP Short Term Goals - 02/10/20 1215      SLP SHORT TERM GOAL #1   Title Patient will demonstrate abdominal breathing in isolation 90% accuracy over 5 minutes x 2 sessions.    Baseline chest breathing 100%; 02-10-20    Time 1    Period Weeks    Status On-going      SLP SHORT TERM GOAL #2   Title Pt will use abdominal breathing in 18/20 sentence responses x2 visits with rare min A.    Baseline chest breathing 100%    Time 1    Period Weeks    Status On-going      SLP SHORT TERM GOAL #3   Title Patient will use compensations for fluency in 5 minutes simple conversation with rare min A x 2 visits.    Baseline not using compensations    Time 1    Period Weeks    Status On-going            SLP Long Term Goals - 02/10/20 1215      SLP LONG TERM GOAL #1   Title Patient will demonstrate abdominal breathing in 10 minutes simple conversation  at least 80% accuracy x2 visits.    Baseline chest breathing 100%    Time 4    Period Weeks    Status On-going      SLP LONG TERM GOAL #2   Title Patient will use fluency compensations in 10 minutes simple conversation independently x 2 visits.    Baseline not using compensations    Time 4    Period Weeks    Status On-going      SLP LONG TERM GOAL #3   Title Patient will report reduced frustration with her communication abilities than prior to ST.    Baseline "very frustrated"    Time 4    Period Weeks    Status On-going            Plan - 02/10/20 1214    Clinical Impression Statement Patient presents with inconsistent articulatory and phonatory repetitions. 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 Pt had "stuttering" errors more frequently with phoneme /h/ but also had starting and stopping on words which orthographically contained the letter h but not the phoneme /h/ (eg/ "hour"). Minimal stuttering after AB, continuous phonation tasks. Pattern of errors seems inconsistent with a neurogenic stutter or vocal cord dysfunction. I feel pt would benefit from course of skilled ST for respiratory-based interventions as well as fluency compensations help to improve her speech at the conversation level.    Speech Therapy Frequency 2x / week    Duration --   6 weeks or 13 total visits   Treatment/Interventions Environmental controls;Cueing hierarchy;SLP instruction and feedback;Functional tasks;Compensatory strategies;Patient/family education;Other (comment)   abdominal breathing   Potential to Achieve Goals Good    SLP Home Exercise Plan abdominal breathing provided    Consulted and Agree with Plan of Care Patient           Patient will benefit from skilled therapeutic intervention in order to improve the following deficits and impairments:   Stuttering  Other voice and resonance  disorders  Functional voice disorder    Problem List Patient Active Problem List   Diagnosis Date Noted  . Hip pain 12/08/2019  . Hyperlipidemia associated with type 2 diabetes mellitus (HCC) 11/23/2019  . History of COVID-19 10/27/2019  . Stuttering 10/27/2019  .  Depression with anxiety 10/27/2019  . Obesity, Class III, BMI 40-49.9 (morbid obesity) (HCC) 06/30/2019  . OSA (obstructive sleep apnea) 02/16/2019  . Essential hypertension 06/22/2018  . Uncontrolled type 2 diabetes mellitus with complication, with long-term current use of insulin (HCC) 12/13/2016  . Asthma 07/30/2016  . 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/07/2016  . DM neuropathy, type II diabetes mellitus (HCC) 08/24/2015    [facility] ,MS, CCC-SLP  02/10/2020, 12:16 PM  [facility] [address] [place], [place], [address] Phone: [phone]   Fax:  [phone]   Name: Sarah Garrison MRN: [id number] Date of Birth: [birth year]

## 2020-02-10 NOTE — Patient Instructions (Signed)
Okay to continue Advair 2 puffs twice a day.  Rinse and gargle after using. We will prescribe albuterol.  You can use 2 puffs up to every 4 hours if needed for shortness of breath, chest tightness, wheezing. We will arrange for full pulmonary function testing in next office visit Agree that you should keep working on trying to wear your CPAP reliably every night while sleeping. Agree with speech therapy. Follow with Dr. Delton Coombes next available with full pulmonary function testing on the same day.

## 2020-02-10 NOTE — Assessment & Plan Note (Signed)
Elevated diastolic on evaluation today.  She will need tight blood pressure control.  Certainly at risk for diastolic dysfunction as a contributor to her overall functional capacity and dyspnea.

## 2020-02-10 NOTE — Progress Notes (Signed)
Subjective:    Patient ID: Sarah Garrison, female    DOB: [birth year], 39 y.o.   MRN: [id number]  HPI 39 year old woman with a minimal tobacco history (0.2 pack years), diabetes, GERD, hypertension, seizure disorder, obstructive sleep apnea (optimal CPAP of 8 cmH2O).  She carries a history of childhood asthma, then active again in her early 96's.  She had COVID-19 pneumonitis diagnosed 09/29/2019, admitted early May and received steroids, Remdesivir, was treated for possible superimposed bacterial PNA. Ever since the hospitalization she has resting and exertional SOB. She also noted onset stuttering speech pattern. Dyspnea is worse when she is supine. No cough or mucous. She feels a chest heaviness, she hears wheeze. Her functional capacity is down - she can walk for 5 minutes and then has to stop. She still hasn't gotten used to CPAP - it makes her more SOB so she has to stop. Started on Advair in June, taken off lisinopril. She feels that the stuttering is improving some with SLP. The stutter is actually intermittently blocking airflow, even when she is not trying to speak.   Her DBP is 102 on intake eval this pm  PSG 12/15/2019 mask desensitization performed CT chest 09/17/2019, apparently performed for trauma, reviewed by me, shows clear lungs without any infiltrates or effusions  Review of Systems As per HPI  Past Medical History:  Diagnosis Date  . Asthma   . COVID-19   . Diabetes mellitus without complication (HCC)   . GERD (gastroesophageal reflux disease)   . Hypertension   . Seizures (HCC)      Family History  Problem Relation Age of Onset  . Asthma Mother   . Kidney failure Mother   . Brain cancer Mother   . Asthma Father   . Other Father        surgery on stomach but don't know from what  . Diabetes Brother   . Diabetes Maternal Grandmother      Social History   Socioeconomic History  . Marital status: Single    Spouse name: Not on file  . Number of children: 4    . Years of education: Not on file  . Highest education level: Not on file  Occupational History  . Not on file  Tobacco Use  . Smoking status: Former Smoker    Packs/day: 0.10    Years: 2.00    Pack years: 0.20    Types: Cigarettes    Quit date: 11/27/2017    Years since quitting: 2.2  . Smokeless tobacco: Never Used  Vaping Use  . Vaping Use: Never used  Substance and Sexual Activity  . Alcohol use: No  . Drug use: No  . Sexual activity: Yes    Birth control/protection: Surgical  Other Topics Concern  . Not on file  Social History Narrative   Right handed   No caffeine use   Diet coke sometimes    Social Determinants of Health   Financial Resource Strain:   . Difficulty of Paying Living Expenses: Not on file  Food Insecurity:   . Worried About Programme researcher, broadcasting/film/video in the Last Year: Not on file  . Ran Out of Food in the Last Year: Not on file  Transportation Needs:   . Lack of Transportation (Medical): Not on file  . Lack of Transportation (Non-Medical): Not on file  Physical Activity:   . Days of Exercise per Week: Not on file  . Minutes of Exercise per Session: Not on file  Stress:   . Feeling of Stress : Not on file  Social Connections:   . Frequency of Communication with Friends and Family: Not on file  . Frequency of Social Gatherings with Friends and Family: Not on file  . Attends Religious Services: Not on file  . Active Member of Clubs or Organizations: Not on file  . Attends Banker Meetings: Not on file  . Marital Status: Not on file  Intimate Partner Violence:   . Fear of Current or Ex-Partner: Not on file  . Emotionally Abused: Not on file  . Physically Abused: Not on file  . Sexually Abused: Not on file     Allergies  Allergen Reactions  . Morphine And Related Shortness Of Breath  . Glyburide Diarrhea and Other (See Comments)    Reaction:  Nose bleeds   . Ivp Dye [Iodinated Diagnostic Agents] Other (See Comments)    Shortness of  breath.    . Oxycodone Other (See Comments)    Pt states that this medication makes her "dream about rabbits chasing" her.    . Vicodin [Hydrocodone-Acetaminophen] Other (See Comments)    Pt states that this medication makes her "dream about rabbits chasing" her.       Outpatient Medications Prior to Visit  Medication Sig Dispense Refill  . acetaminophen (TYLENOL) 500 MG tablet Take 1 tablet (500 mg total) by mouth every 6 (six) hours as needed. 30 tablet 0  . albuterol (VENTOLIN HFA) 108 (90 Base) MCG/ACT inhaler Inhale 1-2 puffs into the lungs every 6 (six) hours as needed for wheezing or shortness of breath. 18 g 1  . ARIPiprazole (ABILIFY) 5 MG tablet Take 1 tablet (5 mg total) by mouth daily. 30 tablet 2  . atorvastatin (LIPITOR) 20 MG tablet Take 1 tablet (20 mg total) by mouth daily. 90 tablet 3  . Continuous Blood Gluc Sensor (DEXCOM G6 SENSOR) MISC SMARTSIG:1 Topical Every 10 Days    . Continuous Blood Gluc Transmit (DEXCOM G6 TRANSMITTER) MISC USE TO CHECK BLOOD SUGAR EVERY 3 MONTHS    . cyclobenzaprine (FLEXERIL) 10 MG tablet Take 1 tablet (10 mg total) by mouth 3 (three) times daily as needed for muscle spasms. May take 1/2 tab if causes excessive drowsiness 60 tablet 0  . fluticasone-salmeterol (ADVAIR HFA) 115-21 MCG/ACT inhaler Inhale 2 puffs into the lungs 2 (two) times daily. 1 Inhaler 12  . insulin aspart (NOVOLOG) 100 UNIT/ML injection USE VIA INSULIN PUMP. MAX TOTAL DAILY DOSE OF 200 UNITS    . insulin degludec (TRESIBA) 100 UNIT/ML FlexTouch Pen Inject into the skin.    . Insulin Disposable Pump (OMNIPOD DASH 5 PACK PODS) MISC Take by mouth every other day.    . Insulin Human (INSULIN PUMP) SOLN Inject 1 each into the skin See admin instructions. Medication: Novolog 100 units/ml injection. Takes 100 units via pump per patient.    . Lactulose 20 GM/30ML SOLN Take 30 mLs (20 g total) by mouth at bedtime. For 3 nights then as needed for constipation 450 mL 0  . losartan  (COZAAR) 50 MG tablet Take 1 tablet (50 mg total) by mouth daily. 90 tablet 3  . Misc. Devices MISC CPAP therapy on autopap 5-15.  Needs Small size Fisher&Paykel Full Face Mask Simplus  mask and heated humidification. 1 each 0  . nystatin (MYCOSTATIN) 100000 UNIT/ML suspension Take 5 mLs by mouth 4 (four) times daily.    . pantoprazole (PROTONIX) 40 MG tablet Take 1 tablet (40 mg total)  by mouth daily. 15 tablet 0  . sertraline (ZOLOFT) 50 MG tablet Take 1 tablet (50 mg total) by mouth daily. 30 tablet 3  . TRUEPLUS PEN NEEDLES 32G X 4 MM MISC USE 3 TIMES DAILY AS DIRECTED 100 each 5  . VITAMIN D PO Take 1 tablet by mouth 2 (two) times daily.    . Cetirizine HCl 10 MG CAPS Take 1 capsule (10 mg total) by mouth daily. 30 capsule 0   No facility-administered medications prior to visit.        Objective:   Physical Exam  Vitals:   02/10/20 1432  BP: (!) 132/102  Pulse: (!) 102  Temp: (!) 97.4 F (36.3 C)  TempSrc: Temporal  SpO2: 99%  Weight: 242 lb 9.6 oz (110 kg)  Height: 5\' 4"  (1.626 m)   Gen: Pleasant, obese woman, in no distress, anxious affect  ENT: No lesions,  mouth clear,  oropharynx clear, no postnasal drip  Neck: No JVD, no stridor  Lungs: No use of accessory muscles, she has a somewhat stuttered expiratory resp pattern even when she is not speaking, decreased at both bases, no crackles or wheezing on normal respiration, no wheeze on forced expiration  Cardiovascular: RRR, heart sounds normal, no murmur or gallops, no peripheral edema  Musculoskeletal: No deformities, no cyanosis or clubbing  Neuro: alert, awake, non focal  Skin: Warm, no lesions or rash    Assessment & Plan:  Stuttering Sounds almost like intermittent upper airway obstruction as opposed to classic stuttering and difficulty with phonation.  She has a stuttering speech pattern that matches up with her respiratory pattern.  Question labile upper airway, vocal cord dysfunction, vocal cord injury,  possibly even diaphragmatic injury although imaging has not shown a paralyzed hemidiaphragm.  We should be able to see some of this and better define on spirometry  Asthma She had childhood asthma and now adult asthma diagnosed clinically.  She may be getting some benefit from Advair.  I will give her an albuterol inhaler to use as needed in addition to the Advair.  We will check pulmonary function testing to quantify her degree of obstruction.  OSA (obstructive sleep apnea) Agree that she will benefit from CPAP compliance.  Both to treat her obstructive sleep apnea, prevent downstream effects such as secondary pulmonary hypertension but also to prevent a contribution upper airway irritation.  Essential hypertension Elevated diastolic on evaluation today.  She will need tight blood pressure control.  Certainly at risk for diastolic dysfunction as a contributor to her overall functional capacity and dyspnea.  History of COVID-19 She denies this is a trigger for her progressive dyspnea and her stuttering respiratory pattern.  She was not intubated so cannot describe upper airway lability to this.  Chest x-ray 01/25/2020 reviewed and reassuring without any evidence of interstitial disease.  PFT ordered to evaluate for Covid associated airflow obstruction or restriction.  01/27/2020, MD, PhD 02/10/2020, 2:56 PM [facility] [phone] or if no answer [phone]

## 2020-02-10 NOTE — Assessment & Plan Note (Signed)
Sounds almost like intermittent upper airway obstruction as opposed to classic stuttering and difficulty with phonation.  She has a stuttering speech pattern that matches up with her respiratory pattern.  Question labile upper airway, vocal cord dysfunction, vocal cord injury, possibly even diaphragmatic injury although imaging has not shown a paralyzed hemidiaphragm.  We should be able to see some of this and better define on spirometry

## 2020-02-12 ENCOUNTER — Encounter: Payer: Self-pay | Admitting: Physical Therapy

## 2020-02-12 ENCOUNTER — Ambulatory Visit: Payer: Medicaid Other

## 2020-02-12 ENCOUNTER — Ambulatory Visit: Payer: Medicaid Other | Admitting: Physical Therapy

## 2020-02-12 ENCOUNTER — Other Ambulatory Visit: Payer: Self-pay

## 2020-02-12 DIAGNOSIS — R498 Other voice and resonance disorders: Secondary | ICD-10-CM

## 2020-02-12 DIAGNOSIS — F8081 Childhood onset fluency disorder: Secondary | ICD-10-CM

## 2020-02-12 DIAGNOSIS — R2689 Other abnormalities of gait and mobility: Secondary | ICD-10-CM

## 2020-02-12 DIAGNOSIS — G8929 Other chronic pain: Secondary | ICD-10-CM

## 2020-02-12 DIAGNOSIS — M25511 Pain in right shoulder: Secondary | ICD-10-CM

## 2020-02-12 DIAGNOSIS — M25551 Pain in right hip: Secondary | ICD-10-CM | POA: Diagnosis not present

## 2020-02-12 DIAGNOSIS — F444 Conversion disorder with motor symptom or deficit: Secondary | ICD-10-CM

## 2020-02-12 NOTE — Patient Instructions (Signed)
Continue to work on abdominal (belly) breathing with gentle "oooooooooooo" when you exhale.

## 2020-02-12 NOTE — Therapy (Signed)
[facility] Outpatient Rehabilitation [facility] [address] [place], [place], [address] Phone: [phone]   Fax:  [phone]  Physical Therapy Treatment  Patient Details  Name: Sarah Garrison MRN: [id number] Date of Birth: 12/21/1980 Referring Provider (PT): Cristie Hem, [place]   Encounter Date: 02/12/2020   PT End of Session - 02/12/20 1117    Visit Number 9    Number of Visits 12    Date for PT Re-Evaluation 02/19/20    Authorization Type MED PAY ASSURANCE; [place] MEDICAID HEALTHY BLUE    Authorization Time Period 01/01/20-01/22/20, 3 visits from 8/10-9/14/21    Authorization - Visit Number 8    PT Start Time 1102    PT Stop Time 1140    PT Time Calculation (min) 38 min           Past Medical History:  Diagnosis Date  . Asthma   . COVID-19   . Diabetes mellitus without complication (HCC)   . GERD (gastroesophageal reflux disease)   . Hypertension   . Seizures (HCC)     Past Surgical History:  Procedure Laterality Date  . CESAREAN SECTION    . HERNIA REPAIR    . TUBAL LIGATION    . VENTRAL HERNIA REPAIR N/A 11/27/2017   Procedure: VENTRAL HERNIA REPAIR ERAS PATHWAY;  Surgeon: Harriette Bouillon, MD;  Location: [facility];  Service: General;  Laterality: N/A;  . WISDOM TOOTH EXTRACTION      There were no vitals filed for this visit.   Subjective Assessment - 02/12/20 1106    Subjective I am tired, my knee is popping and hurts.    Currently in Pain? Yes    Pain Score 5     Pain Location Shoulder    Pain Orientation Right    Pain Descriptors / Indicators Sore    Pain Type Acute pain    Aggravating Factors  doing hair    Pain Relieving Factors avoid activiites that pull    Pain Score --   up to 10/10 with prolonged walking or isti to stands   Pain Location Knee    Pain Orientation Right    Pain Descriptors / Indicators Sharp    Pain Type Chronic pain    Aggravating Factors  walking, sit-stands    Pain Relieving Factors avoid  things that hurt                             [facility] Adult PT Treatment/Exercise - 02/12/20 0001      Knee/Hip Exercises: Standing   Heel Raises 10 reps    Heel Raises Limitations 10 reps toe raises    Hip Flexion Limitations alternating march facing free motion       Knee/Hip Exercises: Supine   Straight Leg Raises 15 reps    Straight Leg Raises Limitations each       Shoulder Exercises: Supine   Flexion 10 reps    Flexion Limitations suipne cane pullovers       Shoulder Exercises: Seated   Other Seated Exercises Cervical retraction x 10      Shoulder Exercises: Standing   Horizontal ABduction 10 reps    Theraband Level (Shoulder Horizontal ABduction) Level 2 (Red)    External Rotation Strengthening;Right;20 reps    Theraband Level (Shoulder External Rotation) Level 2 (Red)    Internal Rotation Strengthening;Right;20 reps;Theraband    Theraband Level (Shoulder Internal Rotation) Level 3 (Green)  Row Strengthening;Both;Theraband;20 reps    Theraband Level (Shoulder Row) Level 3 (Green)      Shoulder Exercises: Pulleys   Flexion 2 minutes    Scaption Limitations 1 minute       Shoulder Exercises: ROM/Strengthening   Other ROM/Strengthening Exercises standing right shoulder flexion wall slides .single arm x 10 abduction slides       Shoulder Exercises: Stretch   Internal Rotation Stretch 3 reps   30 sec    Internal Rotation Stretch Limitations 30 sec  AAROM using opposite hand    Other Shoulder Stretches upper trap stretch bilat x 30 sec each    Other Shoulder Stretches Levator stretch x 30 sec bilat                    PT Short Term Goals - 02/10/20 1018      PT SHORT TERM GOAL #1   Title Pt will be ind in an initail HEP    Time 3    Period Weeks    Status Achieved      PT SHORT TERM GOAL #2   Title Ptwill voice understanding/return demonstration of measure to reduce and manage R hip pain    Time 3    Period Weeks    Status On-going       PT SHORT TERM GOAL #3   Title Establish LTGs following the initial 4 visits of PT care    Time 3    Period Weeks    Status Achieved      PT SHORT TERM GOAL #4   Title Pt will be independent with self correcting her forward head and rounded shoulder posture    Baseline Needs cueing    Time 3    Period Weeks    Status On-going             PT Long Term Goals - 02/10/20 1018      PT LONG TERM GOAL #1   Title pt will be independent with advanced HEP    Time 4    Period Weeks    Status On-going      PT LONG TERM GOAL #2   Title Pt will have full pain free shoulder ROM    Baseline improved, see objective measures    Time 4    Period Weeks    Status On-going      PT LONG TERM GOAL #3   Title Pt will be able to lift 10 lbs overhead for home tasks    Time 4    Period Weeks    Status Unable to assess      PT LONG TERM GOAL #4   Title Pt will be able to stand comfortably for 30 minutes to be able to perform household chores    Baseline 15-20 minutes    Time 4    Period Weeks    Status On-going      PT LONG TERM GOAL #5   Title Pt will be able to amb around the grocery store without discomfort    Time 4    Period Weeks    Status On-going                 Plan - 02/12/20 1138    Clinical Impression Statement Pt c/o less shoulder pain but up to 10/10 knee pain with gait and transitions. Session spent mostly on shoulder strengthening and open chain LE strength. Session tolerated weill with intermittent c/o right neck /  posterior shoulder pain with overhead ROM.    PT Next Visit Plan continue funtional focus reduce pain perseveration,consider dual tasking with her exercises to reduce her focus on pain.  step ups, continue closed chain for LE therex, continue to progress shoulder ROM and strength,  repeat manual to right upper trap prn, - hurt vs harm    PT Home Exercise Plan Access Code: [email or web address]- wall wash flexion & abduction, row, shoulder extension, and shoulder  ER with tband           Patient will benefit from skilled therapeutic intervention in order to improve the following deficits and impairments:  Abnormal gait, Difficulty walking, Decreased range of motion, Obesity, Decreased activity tolerance, Pain, Impaired flexibility, Decreased mobility, Decreased strength, Postural dysfunction  Visit Diagnosis: Chronic hip pain, right  Other abnormalities of gait and mobility  Chronic right shoulder pain     Problem List Patient Active Problem List   Diagnosis Date Noted  . Hip pain 12/08/2019  . Hyperlipidemia associated with type 2 diabetes mellitus (HCC) 11/23/2019  . History of COVID-19 10/27/2019  . Stuttering 10/27/2019  . Depression with anxiety 10/27/2019  . Obesity, Class III, BMI 40-49.9 (morbid obesity) (HCC) 06/30/2019  . OSA (obstructive sleep apnea) 02/16/2019  . Essential hypertension 06/22/2018  . Uncontrolled type 2 diabetes mellitus with complication, with long-term current use of insulin (HCC) 12/13/2016  . Asthma 07/30/2016  . 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/07/2016  . DM neuropathy, type II diabetes mellitus (HCC) 08/24/2015    Sherrie Mustache, PTA 02/12/2020, 11:39 AM  [facility] [address] [place], [place], [address] Phone: [phone]   Fax:  [phone]  Name: BRITTAIN SMITHEY MRN: [id number] Date of Birth: 1981-03-16

## 2020-02-12 NOTE — Therapy (Signed)
[facility] [address] [place], [place], [address] Phone: [phone]   Fax:  [phone]  Speech Language Pathology Treatment  Patient Details  Name: Sarah Garrison MRN: [id number] Date of Birth: 1980/09/24 Referring Provider (SLP): Asencion Noble, MD   Encounter Date: 02/12/2020   End of Session - 02/12/20 1820    Visit Number 5    Number of Visits 13    Date for SLP Re-Evaluation 03/07/20    Authorization Type BCBS Medicaid    Authorization Time Period 12 visits approved through 03/11/20    Authorization - Visit Number 4    Authorization - Number of Visits 12    Activity Tolerance Patient tolerated treatment well           Past Medical History:  Diagnosis Date  . Asthma   . COVID-19   . Diabetes mellitus without complication ([place])   . GERD (gastroesophageal reflux disease)   . Hypertension   . Seizures ([place])     Past Surgical History:  Procedure Laterality Date  . CESAREAN SECTION    . HERNIA REPAIR    . TUBAL LIGATION    . VENTRAL HERNIA REPAIR N/A 11/27/2017   Procedure: [place];  Surgeon: Erroll Luna, MD;  Location: [employer];  Service: General;  Laterality: N/A;  . WISDOM TOOTH EXTRACTION      There were no vitals filed for this visit.   Subjective Assessment - 02/12/20 0846    Subjective "I'm so sleepy. I took a Tylenol PM at 0430 and it didn't help. I think it's kicking in now."    Currently in Pain? Yes    Pain Score 4     Pain Location Shoulder    Pain Orientation Right    Pain Descriptors / Indicators Sore                 ADULT SLP TREATMENT - 02/12/20 0847      General Information   Behavior/Cognition Cooperative;Pleasant mood;Lethargic      Treatment Provided   Treatment provided Cognitive-Linquistic      Cognitive-Linquistic Treatment   Treatment focused on Voice;Other (comment)    Skilled Treatment Pt politely  refused to go to PT room and work on abdominal breathing (AB) in supine. SLP and pt worked on this in seated in [place] room. Pt success was ~60%, with shallow breaths. SLP attempted to have pt take longer breath cycles however pt largely unable to do so. At approximately [phone] pt stated she thought she would need to go to her brother's house and sleep until her PT appointment at 1145. SLP agreed to pt's request and told her to cont to practice with AB over the weekend. SLP told pt to work with voicing easy "oooooo" on exhale.       Assessment / Recommendations / Plan   Plan Continue with current plan of care      Progression Toward Goals   Progression toward goals Not progressing toward goals (comment)   fatigue/lethargy             SLP Short Term Goals - 02/12/20 1821      SLP SHORT TERM GOAL #1   Title Patient will demonstrate abdominal breathing in isolation 90% accuracy over 5 minutes x 2 sessions.    Baseline chest breathing 100%; 02-10-20    Time 1    Period Weeks    Status Partially Met  SLP SHORT TERM GOAL #2   Title Pt will use abdominal breathing in 18/20 sentence responses x2 visits with rare min A.    Baseline chest breathing 100%    Time 1    Period Weeks    Status Not Met      SLP SHORT TERM GOAL #3   Title Patient will use compensations for fluency in 5 minutes simple conversation with rare min A x 2 visits.    Baseline not using compensations    Time 1    Period Weeks    Status Not Met            SLP Long Term Goals - 02/12/20 1821      SLP LONG TERM GOAL #1   Title Patient will demonstrate abdominal breathing in 10 minutes simple conversation at least 80% accuracy x2 visits.    Baseline chest breathing 100%    Time 4    Period Weeks    Status On-going      SLP LONG TERM GOAL #2   Title Patient will use fluency compensations in 10 minutes simple conversation independently x 2 visits.    Baseline not using compensations    Time 4    Period Weeks     Status On-going      SLP LONG TERM GOAL #3   Title Patient will report reduced frustration with her communication abilities than prior to [place].    Baseline "very frustrated"    Time 4    Period Weeks    Status On-going            Plan - 02/12/20 1821    Clinical Impression Statement Patient presents with inconsistent articulatory and phonatory repetitions. 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 Pt had "stuttering" errors more frequently with phoneme /h/ but also had starting and stopping on words which orthographically contained the letter h but not the phoneme /h/ (eg/ "hour"). Minimal stuttering after AB, continuous phonation tasks. Pattern of errors seems inconsistent with a neurogenic stutter or vocal cord dysfunction. I feel pt would benefit from course of skilled ST for respiratory-based interventions as well as fluency compensations help to improve her speech at the conversation level.    Speech Therapy Frequency 2x / week    Duration --   6 weeks or 13 total visits   Treatment/Interventions Environmental controls;Cueing hierarchy;SLP instruction and feedback;Functional tasks;Compensatory strategies;Patient/family education;Other (comment)   abdominal breathing   Potential to Achieve Goals Good    SLP Home Exercise Plan abdominal breathing provided    Consulted and Agree with Plan of Care Patient           Patient will benefit from skilled therapeutic intervention in order to improve the following deficits and impairments:   Stuttering  Other voice and resonance disorders  Functional voice disorder    Problem List Patient Active Problem List   Diagnosis Date Noted  . Hip pain 12/08/2019  . Hyperlipidemia associated with type 2 diabetes mellitus ([place]) 11/23/2019  . History of COVID-19 10/27/2019  . Stuttering 10/27/2019  . Depression with anxiety 10/27/2019  . Obesity, Class III, BMI  40-49.9 (morbid obesity) ([place]) 06/30/2019  . OSA (obstructive sleep apnea) 02/16/2019  . Essential hypertension 06/22/2018  . Uncontrolled type 2 diabetes mellitus with complication, with long-term current use of insulin ([place]) 12/13/2016  . Asthma 07/30/2016  . 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/07/2016  . DM neuropathy, type II  diabetes mellitus ([place]) 08/24/2015    [facility] ,[place], [place]  02/12/2020, 6:22 PM  [facility] [address] [place], [place], [address] Phone: [phone]   Fax:  [phone]   Name: Sarah Garrison MRN: [id number] Date of Birth: 06/14/1980

## 2020-02-16 ENCOUNTER — Other Ambulatory Visit: Payer: Self-pay

## 2020-02-16 ENCOUNTER — Encounter: Payer: Self-pay | Admitting: Physical Therapy

## 2020-02-16 ENCOUNTER — Ambulatory Visit: Payer: Medicaid Other | Admitting: Speech Pathology

## 2020-02-16 ENCOUNTER — Ambulatory Visit: Payer: Medicaid Other | Admitting: Physical Therapy

## 2020-02-16 DIAGNOSIS — M25551 Pain in right hip: Secondary | ICD-10-CM

## 2020-02-16 DIAGNOSIS — M25511 Pain in right shoulder: Secondary | ICD-10-CM

## 2020-02-16 DIAGNOSIS — R2689 Other abnormalities of gait and mobility: Secondary | ICD-10-CM

## 2020-02-16 DIAGNOSIS — G8929 Other chronic pain: Secondary | ICD-10-CM

## 2020-02-16 DIAGNOSIS — F8081 Childhood onset fluency disorder: Secondary | ICD-10-CM

## 2020-02-16 DIAGNOSIS — M2560 Stiffness of unspecified joint, not elsewhere classified: Secondary | ICD-10-CM

## 2020-02-16 DIAGNOSIS — R498 Other voice and resonance disorders: Secondary | ICD-10-CM

## 2020-02-16 NOTE — Therapy (Signed)
[facility] [address] [place], [place], [address] Phone: [phone]   Fax:  [phone]  Speech Language Pathology Treatment  Patient Details  Name: Sarah Garrison MRN: [id number] Date of Birth: September 22, 1980 Referring Provider (SLP): Asencion Noble, MD   Encounter Date: 02/16/2020   End of Session - 02/16/20 1017    Visit Number 6    Number of Visits 13    Date for SLP Re-Evaluation 03/07/20    Authorization Type BCBS Medicaid    Authorization Time Period 12 visits approved through 03/11/20    Authorization - Visit Number 5    Authorization - Number of Visits 12    SLP Start Time 0930    SLP Stop Time  1010    SLP Time Calculation (min) 40 min    Activity Tolerance Patient tolerated treatment well           Past Medical History:  Diagnosis Date  . Asthma   . COVID-19   . Diabetes mellitus without complication ([place])   . GERD (gastroesophageal reflux disease)   . Hypertension   . Seizures ([place])     Past Surgical History:  Procedure Laterality Date  . CESAREAN SECTION    . HERNIA REPAIR    . TUBAL LIGATION    . VENTRAL HERNIA REPAIR N/A 11/27/2017   Procedure: [place];  Surgeon: Erroll Luna, MD;  Location: [place] [facility];  Service: General;  Laterality: N/A;  . WISDOM TOOTH EXTRACTION      There were no vitals filed for this visit.   Subjective Assessment - 02/16/20 0933    Subjective "I ha-ha-had to keep blowing it and blowing it."    Currently in Pain? No/denies                 ADULT SLP TREATMENT - 02/16/20 0934      General Information   Behavior/Cognition Cooperative;Pleasant mood;Lethargic      Treatment Provided   Treatment provided Cognitive-Linquistic      Pain Assessment   Pain Assessment No/denies pain      Cognitive-Linquistic Treatment   Treatment focused on Voice;Other (comment)    Skilled Treatment Pt preferred to  practice AB upright vs reclined due to reflux. Elevated feet on chair in therapy room and pt success approx 75% with mod cues, fading to supervision. Integrated abdominal breathing into simple 1-5 word responses; after SLP cuing to inhale when spasm occurred, pt began doing this automatically ~60% of the time, with SLP cuing to do so remainder of the time. Trained in pursed lip breathing (see pt instructions). Pt rates her speech as 6/10, with 10 being her normal speech. She feels she has had improvement in short responses, conversations but notes difficulty in longer conversations. SLP educated we will continue to target her breathing and progress to longer conversations. When focused on AB pt had few, minimal instances of spasm/ stutter (4 in 20 minutes).      Assessment / Recommendations / Plan   Plan Continue with current plan of care      Progression Toward Goals   Progression toward goals Progressing toward goals              SLP Short Term Goals - 02/16/20 1017      SLP SHORT TERM GOAL #1   Title Patient will demonstrate abdominal breathing in isolation 90% accuracy over 5 minutes x 2 sessions.    Baseline chest breathing 100%;  02-10-20    Time 1    Period Weeks    Status Partially Met      SLP SHORT TERM GOAL #2   Title Pt will use abdominal breathing in 18/20 sentence responses x2 visits with rare min A.    Baseline chest breathing 100%    Time 1    Period Weeks    Status Not Met      SLP SHORT TERM GOAL #3   Title Patient will use compensations for fluency in 5 minutes simple conversation with rare min A x 2 visits.    Baseline not using compensations    Time 1    Period Weeks    Status Not Met            SLP Long Term Goals - 02/16/20 1017      SLP LONG TERM GOAL #1   Title Patient will demonstrate abdominal breathing in 10 minutes simple conversation at least 80% accuracy x2 visits.    Baseline chest breathing 100%    Time 3    Period Weeks    Status On-going       SLP LONG TERM GOAL #2   Title Patient will use fluency compensations in 10 minutes simple conversation independently x 2 visits.    Baseline not using compensations    Time 3    Period Weeks    Status On-going      SLP LONG TERM GOAL #3   Title Patient will report reduced frustration with her communication abilities than prior to [place].    Baseline "very frustrated"    Time 3    Period Weeks    Status On-going            Plan - 02/16/20 1018    Clinical Impression Statement Patient presents with inconsistent articulatory and phonatory repetitions. 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 Pt had "stuttering" errors more frequently with phoneme /h/ but also had starting and stopping on words which orthographically contained the letter h but not the phoneme /h/ (eg/ "hour"). Minimal stuttering after AB, continuous phonation tasks. Pattern of errors seems inconsistent with a neurogenic stutter or vocal cord dysfunction. I feel pt would benefit from course of skilled ST for respiratory-based interventions as well as fluency compensations help to improve her speech at the conversation level.    Speech Therapy Frequency 2x / week    Duration --   6 weeks or 13 total visits   Treatment/Interventions Environmental controls;Cueing hierarchy;SLP instruction and feedback;Functional tasks;Compensatory strategies;Patient/family education;Other (comment)   abdominal breathing   Potential to Achieve Goals Good    SLP Home Exercise Plan abdominal breathing provided    Consulted and Agree with Plan of Care Patient           Patient will benefit from skilled therapeutic intervention in order to improve the following deficits and impairments:   Stuttering  Other voice and resonance disorders    Problem List Patient Active Problem List   Diagnosis Date Noted  . Hip pain 12/08/2019  . Hyperlipidemia associated  with type 2 diabetes mellitus ([place]) 11/23/2019  . History of COVID-19 10/27/2019  . Stuttering 10/27/2019  . Depression with anxiety 10/27/2019  . Obesity, Class III, BMI 40-49.9 (morbid obesity) ([place]) 06/30/2019  . OSA (obstructive sleep apnea) 02/16/2019  . Essential hypertension 06/22/2018  . Uncontrolled type 2 diabetes mellitus with complication, with long-term current use of  insulin ([place]) 12/13/2016  . Asthma 07/30/2016  . 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/07/2016  . DM neuropathy, type II diabetes mellitus ([place]) 08/24/2015   Deneise Lever, [place], [place] 02/16/2020, 10:19 AM  [facility] [address] [place], [place], [address] Phone: [phone]   Fax:  [phone]   Name: Sarah Garrison MRN: [id number] Date of Birth: 03/02/1981

## 2020-02-16 NOTE — Therapy (Signed)
[facility] Outpatient Rehabilitation [facility] [address] [place], [place], [address] Phone: [phone]   Fax:  [phone]  Physical Therapy Treatment  Patient Details  Name: Sarah Garrison MRN: [id number] Date of Birth: 1980-07-31 Referring Provider (PT): Cristie Hem, [place]   Encounter Date: 02/16/2020   PT End of Session - 02/16/20 1235    Visit Number 10    Number of Visits 12    Date for PT Re-Evaluation 02/19/20    Authorization Type MED PAY ASSURANCE; [place] MEDICAID HEALTHY BLUE    Authorization Time Period 01/01/20-01/22/20, 3 visits from 8/10-9/14/21, 12 visits from 02/10/20-03/09/20    Authorization - Visit Number 3    Authorization - Number of Visits 12    PT Start Time 1230    PT Stop Time 1310    PT Time Calculation (min) 40 min           Past Medical History:  Diagnosis Date  . Asthma   . COVID-19   . Diabetes mellitus without complication (HCC)   . GERD (gastroesophageal reflux disease)   . Hypertension   . Seizures (HCC)     Past Surgical History:  Procedure Laterality Date  . CESAREAN SECTION    . HERNIA REPAIR    . TUBAL LIGATION    . VENTRAL HERNIA REPAIR N/A 11/27/2017   Procedure: VENTRAL HERNIA REPAIR ERAS PATHWAY;  Surgeon: Harriette Bouillon, MD;  Location: [facility];  Service: General;  Laterality: N/A;  . WISDOM TOOTH EXTRACTION      There were no vitals filed for this visit.   Subjective Assessment - 02/16/20 1235    Subjective Patient reports no pain today.    Currently in Pain? No/denies    Aggravating Factors  prolonged stairs or standing, reaching    Pain Relieving Factors avoid hurtful activities                             [facility] Adult PT Treatment/Exercise - 02/16/20 0001      Knee/Hip Exercises: Aerobic   Nustep L5 x 7 minutes UE/LE   HR 114 bpm, SP02 98%     Knee/Hip Exercises: Standing   Heel Raises 10 reps    Heel Raises Limitations 10 reps toe raises    Hip Flexion  Limitations alternating march facing free motion     Forward Step Up Limitations 6 inch step up 10 Rt/Lt bilat UE    1 UE   Functional Squat Limitations 10 reps holding free motion railing       Shoulder Exercises: Seated   Other Seated Exercises Cervical retraction x 10      Shoulder Exercises: Standing   Horizontal ABduction 10 reps   2set   Theraband Level (Shoulder Horizontal ABduction) Level 2 (Red)    External Rotation Strengthening;Right;20 reps    Theraband Level (Shoulder External Rotation) Level 2 (Red)    Internal Rotation Strengthening;Right;20 reps;Theraband    Theraband Level (Shoulder Internal Rotation) Level 3 (Green)    Extension 20 reps    Theraband Level (Shoulder Extension) Level 3 (Green)    Row Strengthening;Both;Theraband;20 reps    Theraband Level (Shoulder Row) Level 3 (Green)    Other Standing Exercises lower trap setting facing wall, bilat       Shoulder Exercises: Pulleys   Flexion 2 minutes    Scaption Limitations 1 minute       Shoulder Exercises: ROM/Strengthening   UBE (Upper  Arm Bike) L 1 2 minutes forward and back     Other ROM/Strengthening Exercises lower trap setting facing wall x 10       Shoulder Exercises: Stretch   Internal Rotation Stretch 3 reps   30 sec    Internal Rotation Stretch Limitations 30 sec  AAROM using opposite hand    Other Shoulder Stretches upper trap stretch bilat x 30 sec each    Other Shoulder Stretches Levator stretch x 30 sec bilat                    PT Short Term Goals - 02/10/20 1018      PT SHORT TERM GOAL #1   Title Pt will be ind in an initail HEP    Time 3    Period Weeks    Status Achieved      PT SHORT TERM GOAL #2   Title Ptwill voice understanding/return demonstration of measure to reduce and manage R hip pain    Time 3    Period Weeks    Status On-going      PT SHORT TERM GOAL #3   Title Establish LTGs following the initial 4 visits of PT care    Time 3    Period Weeks    Status  Achieved      PT SHORT TERM GOAL #4   Title Pt will be independent with self correcting her forward head and rounded shoulder posture    Baseline Needs cueing    Time 3    Period Weeks    Status On-going             PT Long Term Goals - 02/10/20 1018      PT LONG TERM GOAL #1   Title pt will be independent with advanced HEP    Time 4    Period Weeks    Status On-going      PT LONG TERM GOAL #2   Title Pt will have full pain free shoulder ROM    Baseline improved, see objective measures    Time 4    Period Weeks    Status On-going      PT LONG TERM GOAL #3   Title Pt will be able to lift 10 lbs overhead for home tasks    Time 4    Period Weeks    Status Unable to assess      PT LONG TERM GOAL #4   Title Pt will be able to stand comfortably for 30 minutes to be able to perform household chores    Baseline 15-20 minutes    Time 4    Period Weeks    Status On-going      PT LONG TERM GOAL #5   Title Pt will be able to amb around the grocery store without discomfort    Time 4    Period Weeks    Status On-going                 Plan - 02/16/20 1309    Clinical Impression Statement Pt reports no pain. She notes hip and knee pain with prolonged activities. She notes right upper trap and neck pain with overhead reaching and reaching behind . Contiuned with scap / shoulde strength and functional LE strength.Pt demonstrates improved tolerance to therex without c/o pain.    PT Next Visit Plan Needs extension of POC, does have 12 medicaid visits to use through oct 6th and she is  scheduled    PT Home Exercise Plan Access Code: [email or web address]- wall wash flexion & abduction, row, shoulder extension, and shoulder ER with tband           Patient will benefit from skilled therapeutic intervention in order to improve the following deficits and impairments:  Abnormal gait, Difficulty walking, Decreased range of motion, Obesity, Decreased activity tolerance, Pain, Impaired  flexibility, Decreased mobility, Decreased strength, Postural dysfunction  Visit Diagnosis: Chronic right shoulder pain  Limited joint range of motion (ROM)  Chronic hip pain, right  Other abnormalities of gait and mobility     Problem List Patient Active Problem List   Diagnosis Date Noted  . Hip pain 12/08/2019  . Hyperlipidemia associated with type 2 diabetes mellitus (HCC) 11/23/2019  . History of COVID-19 10/27/2019  . Stuttering 10/27/2019  . Depression with anxiety 10/27/2019  . Obesity, Class III, BMI 40-49.9 (morbid obesity) (HCC) 06/30/2019  . OSA (obstructive sleep apnea) 02/16/2019  . Essential hypertension 06/22/2018  . Uncontrolled type 2 diabetes mellitus with complication, with long-term current use of insulin (HCC) 12/13/2016  . Asthma 07/30/2016  . 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/07/2016  . DM neuropathy, type II diabetes mellitus (HCC) 08/24/2015    Sherrie Mustache, PTA 02/16/2020, 1:12 PM  [facility] [address] [place], [place], [address] Phone: [phone]   Fax:  [phone]  Name: Sarah Garrison MRN: [id number] Date of Birth: Sep 01, 1980

## 2020-02-16 NOTE — Patient Instructions (Signed)
Keep practicing your belly breathing every day for 30 minutes. You can practice in a recliner or on the couch if lying back bothers you.   To help you release or relax your voice box: If you feel a spasm or vibration, breathe in slowly through your nose (like you are smelling a flower), then blow out slowly like you are blowing out a candle.   When you are talking, if you notice a stutter coming on, don't "push through." Stop and take a belly breath in, and let your speech flow smoothly.

## 2020-02-18 ENCOUNTER — Ambulatory Visit: Payer: Medicaid Other | Admitting: Physical Therapy

## 2020-02-18 ENCOUNTER — Encounter: Payer: Self-pay | Admitting: Physical Therapy

## 2020-02-18 ENCOUNTER — Ambulatory Visit: Payer: Medicaid Other | Admitting: Speech Pathology

## 2020-02-18 ENCOUNTER — Other Ambulatory Visit: Payer: Self-pay

## 2020-02-18 DIAGNOSIS — F444 Conversion disorder with motor symptom or deficit: Secondary | ICD-10-CM

## 2020-02-18 DIAGNOSIS — F8081 Childhood onset fluency disorder: Secondary | ICD-10-CM

## 2020-02-18 DIAGNOSIS — M2560 Stiffness of unspecified joint, not elsewhere classified: Secondary | ICD-10-CM

## 2020-02-18 DIAGNOSIS — R498 Other voice and resonance disorders: Secondary | ICD-10-CM

## 2020-02-18 DIAGNOSIS — G8929 Other chronic pain: Secondary | ICD-10-CM

## 2020-02-18 DIAGNOSIS — R2689 Other abnormalities of gait and mobility: Secondary | ICD-10-CM

## 2020-02-18 DIAGNOSIS — M25551 Pain in right hip: Secondary | ICD-10-CM | POA: Diagnosis not present

## 2020-02-18 NOTE — Patient Instructions (Signed)
Neck stretches to help you relax muscles:  Side neck stretch: drop your ear toward your shoulder, hold the stretch for 10 seconds, but keep breathing. Allow the tension to melt away as you breathe out. Repeat on the other side.  Turn head and look over your shoulder. Hold the stretch for 10 seconds, but keep breathing. Repeat on the other side.  Look up and feel gentle stretch in your throat muscles. Hold the stretch for 10 seconds, but keep breathing.  Look down toward your chest. Hold the stretch for 10 seconds, but keep breathing.  Open your mouth wide, inhale and feel your throat stretch, almost like you are going to yawn. Release with a sigh. 10x

## 2020-02-18 NOTE — Therapy (Signed)
[facility] [address] [place], [place], [address] Phone: [phone]   Fax:  [phone]  Speech Language Pathology Treatment  Patient Details  Name: Sarah Garrison MRN: [id number] Date of Birth: Dec 25, 1980 Referring Provider (SLP): Asencion Noble, MD   Encounter Date: 02/18/2020   End of Session - 02/18/20 1103    Visit Number 7    Number of Visits 13    Date for SLP Re-Evaluation 03/07/20    Authorization Type BCBS Medicaid    Authorization Time Period 12 visits approved through 03/11/20    Authorization - Visit Number 6    Authorization - Number of Visits 12    SLP Start Time 0930    SLP Stop Time  1012    SLP Time Calculation (min) 42 min    Activity Tolerance Patient tolerated treatment well           Past Medical History:  Diagnosis Date  . Asthma   . COVID-19   . Diabetes mellitus without complication ([place])   . GERD (gastroesophageal reflux disease)   . Hypertension   . Seizures ([place])     Past Surgical History:  Procedure Laterality Date  . CESAREAN SECTION    . HERNIA REPAIR    . TUBAL LIGATION    . VENTRAL HERNIA REPAIR N/A 11/27/2017   Procedure: [place];  Surgeon: Erroll Luna, MD;  Location: [facility];  Service: General;  Laterality: N/A;  . WISDOM TOOTH EXTRACTION      There were no vitals filed for this visit.   Subjective Assessment - 02/18/20 0847    Subjective "It's been still the same."    Currently in Pain? No/denies                 ADULT SLP TREATMENT - 02/18/20 0847      General Information   Behavior/Cognition Cooperative;Pleasant mood;Lethargic      Treatment Provided   Treatment provided Cognitive-Linquistic      Pain Assessment   Pain Assessment No/denies pain      Cognitive-Linquistic Treatment   Treatment focused on Voice;Other (comment)    Skilled Treatment Pt cont to prefer AB training upright in ST  room vs reclined. Pt reported feeling strain/ "vibration" in her throat when breathing, so SLP guided pt through relaxation stretches to release upper body tension. Cued pt for easy, relaxed breaths with AB vs forced inspiration/expiration. AB at rest ~75% success. Modeled breath pacing with reading task (recipe ingredients/instructions) and pt able to return demonstration with occasional mod cues. When forced expiration / ?possible spasms occurred (x3), cued pt to stop, breathe, and reattempt. On 2 occasions SLP also used pursed lip breathing with pt to aid relaxation. Rare instances of spasm/ stutter in simple conversation after this training (3).       Assessment / Recommendations / Plan   Plan Continue with current plan of care      Progression Toward Goals   Progression toward goals Progressing toward goals              SLP Short Term Goals - 02/18/20 1107      SLP SHORT TERM GOAL #1   Title Patient will demonstrate abdominal breathing in isolation 90% accuracy over 5 minutes x 2 sessions.    Baseline chest breathing 100%; 02-10-20    Time 1    Period Weeks    Status Partially Met      SLP SHORT  TERM GOAL #2   Title Pt will use abdominal breathing in 18/20 sentence responses x2 visits with rare min A.    Baseline chest breathing 100%    Time 1    Period Weeks    Status Not Met      SLP SHORT TERM GOAL #3   Title Patient will use compensations for fluency in 5 minutes simple conversation with rare min A x 2 visits.    Baseline not using compensations    Time 1    Period Weeks    Status Not Met            SLP Long Term Goals - 02/18/20 1107      SLP LONG TERM GOAL #1   Title Patient will demonstrate abdominal breathing in 10 minutes simple conversation at least 80% accuracy x2 visits.    Baseline chest breathing 100%    Time 3    Period Weeks    Status On-going      SLP LONG TERM GOAL #2   Title Patient will use fluency compensations in 10 minutes simple  conversation independently x 2 visits.    Baseline not using compensations    Time 3    Period Weeks    Status On-going      SLP LONG TERM GOAL #3   Title Patient will report reduced frustration with her communication abilities than prior to [place].    Baseline "very frustrated"    Time 3    Period Weeks    Status On-going            Plan - 02/18/20 1103    Clinical Impression Statement Patient presents with inconsistent/intermittent articulatory and phonatory repetitions which appear to occur with forced expiration. 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 Pt had "stuttering" errors more frequently with phoneme /h/ but also had starting and stopping on words which orthographically contained the letter h but not the phoneme /h/ (eg/ "hour"). Minimal stuttering after AB, continuous phonation tasks. Pattern of errors seems inconsistent with a neurogenic stutter or vocal cord dysfunction. I feel pt would benefit from course of skilled ST for respiratory-based interventions as well as fluency compensations help to improve her speech at the conversation level.    Speech Therapy Frequency 2x / week    Duration --   6 weeks or 13 total visits   Treatment/Interventions Environmental controls;Cueing hierarchy;SLP instruction and feedback;Functional tasks;Compensatory strategies;Patient/family education;Other (comment)   abdominal breathing   Potential to Achieve Goals Good    SLP Home Exercise Plan abdominal breathing provided    Consulted and Agree with Plan of Care Patient           Patient will benefit from skilled therapeutic intervention in order to improve the following deficits and impairments:   Functional voice disorder  Stuttering  Other voice and resonance disorders    Problem List Patient Active Problem List   Diagnosis Date Noted  . Hip pain 12/08/2019  . Hyperlipidemia associated with type  2 diabetes mellitus ([place]) 11/23/2019  . History of COVID-19 10/27/2019  . Stuttering 10/27/2019  . Depression with anxiety 10/27/2019  . Obesity, Class III, BMI 40-49.9 (morbid obesity) ([place]) 06/30/2019  . OSA (obstructive sleep apnea) 02/16/2019  . Essential hypertension 06/22/2018  . Uncontrolled type 2 diabetes mellitus with complication, with long-term current use of insulin ([place]) 12/13/2016  . Asthma 07/30/2016  . 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/07/2016  .  DM neuropathy, type II diabetes mellitus ([place]) 08/24/2015   Deneise Lever, [place], [place] Speech-Language Pathologist  Aliene Altes 02/18/2020, 11:07 AM  [facility] [address] [place], [place], [address] Phone: [phone]   Fax:  [phone]   Name: ARLITA BUFFKIN MRN: [id number] Date of Birth: [birth year]

## 2020-02-18 NOTE — Therapy (Signed)
[facility] Outpatient Rehabilitation [facility] [address] [place], [place], [address] Phone: [phone]   Fax:  [phone]  Physical Therapy Treatment and Re-Certification  Patient Details  Name: Sarah Garrison MRN: [id number] Date of Birth: 02/21/1981 Referring Provider (PT): Cristie Hem, [place]   Encounter Date: 02/18/2020   PT End of Session - 02/18/20 1127    Visit Number 11    Number of Visits 20    Date for PT Re-Evaluation 03/17/20    Authorization Type MED PAY ASSURANCE; [place] MEDICAID HEALTHY BLUE    Authorization Time Period 3 visits from 8/10-9/14/21, 12 visits from 02/10/20-03/09/20; physician re-cert until 31/51/76    Authorization - Visit Number 4    Authorization - Number of Visits 12    PT Start Time 1130    PT Stop Time 1215    PT Time Calculation (min) 45 min    Activity Tolerance Patient tolerated treatment well    Behavior During Therapy [facility] for tasks assessed/performed           Past Medical History:  Diagnosis Date  . Asthma   . COVID-19   . Diabetes mellitus without complication (HCC)   . GERD (gastroesophageal reflux disease)   . Hypertension   . Seizures (HCC)     Past Surgical History:  Procedure Laterality Date  . CESAREAN SECTION    . HERNIA REPAIR    . TUBAL LIGATION    . VENTRAL HERNIA REPAIR N/A 11/27/2017   Procedure: VENTRAL HERNIA REPAIR ERAS PATHWAY;  Surgeon: Harriette Bouillon, MD;  Location: [facility];  Service: General;  Laterality: N/A;  . WISDOM TOOTH EXTRACTION      There were no vitals filed for this visit.   Subjective Assessment - 02/18/20 1133    Subjective Pt reports no new complaints. Pt states she went the fair a second time for ~4 hours and was able to go on a few rides and eat a [place] leg. She states she did have to sit down a few times while waiting in line.    Pertinent History Covid !(    Limitations Sitting;Lifting;Standing;Walking;House hold activities    How long can you  sit comfortably? no issue    How long can you stand comfortably? 10-15 mins    How long can you walk comfortably? Unable    Diagnostic tests --    Patient Stated Goals For my pain to get better    Currently in Pain? No/denies    Pain Onset Other (comment)                             [facility] Adult PT Treatment/Exercise - 02/18/20 0001      Knee/Hip Exercises: Aerobic   Nustep L6 x 6 min      Knee/Hip Exercises: Standing   Heel Raises 20 reps    Hip Flexion Stengthening;Both;20 reps;Knee bent    Hip Abduction Stengthening;Both;20 reps    Hip Extension Stengthening;Both;20 reps    Lateral Step Up Right;Left;10 reps    Forward Step Up Limitations 8 inch step up 10 Rt/Lt bilat UE     Functional Squat Limitations 20 reps holding free motion railing     Other Standing Knee Exercises On airex: feet together eyes closed 2 x30 sec, eyes closed with horizontal and vertical head turns 2x 30 sec each, marching x10      Shoulder Exercises: Seated   Other Seated Exercises Cervical  retraction against red band x10    Other Seated Exercises Overhead press 2x10 with 1#      Shoulder Exercises: Standing   Horizontal ABduction 20 reps    Theraband Level (Shoulder Horizontal ABduction) Level 2 (Red)    External Rotation Strengthening;Right;20 reps    Theraband Level (Shoulder External Rotation) Level 3 (Green)    Extension 20 reps;Strengthening;Both    Theraband Level (Shoulder Extension) Level 4 (Blue)    Row Strengthening;Both;Theraband;20 reps    Theraband Level (Shoulder Row) Level 4 (Blue)    Other Standing Exercises overhead reach to first level of cabinet 2# x10, second shelf 2# x10      Shoulder Exercises: Stretch   Other Shoulder Stretches upper trap stretch bilat 10 x 2 sec each (red tband around pt's upper trap muscle knot),     Other Shoulder Stretches levator stretch x30 sec with red tband around R upper trap muscle knot, scalene stretch x30 sec                     PT Short Term Goals - 02/10/20 1018      PT SHORT TERM GOAL #1   Title Pt will be ind in an initail HEP    Time 3    Period Weeks    Status Achieved      PT SHORT TERM GOAL #2   Title Ptwill voice understanding/return demonstration of measure to reduce and manage R hip pain    Time 3    Period Weeks    Status On-going      PT SHORT TERM GOAL #3   Title Establish LTGs following the initial 4 visits of PT care    Time 3    Period Weeks    Status Achieved      PT SHORT TERM GOAL #4   Title Pt will be independent with self correcting her forward head and rounded shoulder posture    Baseline Needs cueing    Time 3    Period Weeks    Status On-going             PT Long Term Goals - 02/18/20 1144      PT LONG TERM GOAL #1   Title pt will be independent with advanced HEP    Time 4    Period Weeks    Status Achieved      PT LONG TERM GOAL #2   Title Pt will have full pain free shoulder ROM    Baseline improved, see objective measures    Time 4    Period Weeks    Status Achieved      PT LONG TERM GOAL #3   Title Pt will be able to lift 10 lbs overhead for home tasks    Time 4    Period Weeks    Status On-going      PT LONG TERM GOAL #4   Title Pt will be able to stand comfortably for 30 minutes to be able to perform household chores    Baseline 15-20 minutes    Time 4    Period Weeks    Status On-going      PT LONG TERM GOAL #5   Title Pt will be able to amb around the grocery store without discomfort    Time 4    Period Weeks    Status On-going                 Plan -  02/18/20 1152    Clinical Impression Statement Pt with R knee pain during steps; otherwise, pt with no increase in pain during treatment. Continued R shoulder and LE strengthening. Initiated overhead reach with 2# weight into cabinet. Pt is progressing well overall and reaching her LTGs but requires more strength for right shoulder and improve standing tolerance.  PT to continue to progress pt's tolerance to standing (1 seated rest break required during PT -- will attempt to get her to tolerate at least 30 min). Pt would benefit from 4 more weeks of therapy to continue to address these issues and improve her function.    Personal Factors and Comorbidities Time since onset of injury/illness/exacerbation;Comorbidity 3+    Comorbidities Hx of Covid-19, DM, asthma    Examination-Activity Limitations Bathing;Bend;Caring for Others;Carry;Dressing;Lift;Toileting;Stand;Stairs;Squat;Sleep;Sit;Reach Overhead;Locomotion Level;Transfers    Stability/Clinical Decision Making Evolving/Moderate complexity    Rehab Potential Fair    PT Frequency 2x / week    PT Duration 4 weeks    PT Treatment/Interventions ADLs/Self Care Home Management;Cryotherapy;Electrical Stimulation;Ultrasound;Traction;Moist Heat;Iontophoresis 4mg /ml Dexamethasone;Gait training;Stair training;Functional mobility training;Therapeutic activities;Therapeutic exercise;Manual techniques;Patient/family education;Passive range of motion;Dry needling;Taping;Vasopneumatic Device    PT Next Visit Plan Continue to progress bilat LE and UE strengthening. Work on overhead. Work on standing tolerance. Consider leg press machine    PT Home Exercise Plan Access Code: - wall wash flexion & abduction, row, shoulder extension, and shoulder ER with tband; added counter squat    Consulted and Agree with Plan of Care Patient            Patient will benefit from skilled therapeutic intervention in order to improve the following deficits and impairments:  Abnormal gait, Difficulty walking, Decreased range of motion, Obesity, Decreased activity tolerance, Pain, Impaired flexibility, Decreased mobility, Decreased strength, Postural dysfunction  Visit Diagnosis: Chronic right shoulder pain  Limited joint range of motion (ROM)  Chronic hip pain, right  Other abnormalities of gait and mobility     Problem  List Patient Active Problem List   Diagnosis Date Noted  . Hip pain 12/08/2019  . Hyperlipidemia associated with type 2 diabetes mellitus (HCC) 11/23/2019  . History of COVID-19 10/27/2019  . Stuttering 10/27/2019  . Depression with anxiety 10/27/2019  . Obesity, Class III, BMI 40-49.9 (morbid obesity) (HCC) 06/30/2019  . OSA (obstructive sleep apnea) 02/16/2019  . Essential hypertension 06/22/2018  . Uncontrolled type 2 diabetes mellitus with complication, with long-term current use of insulin (HCC) 12/13/2016  . Asthma 07/30/2016  . 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/07/2016  . DM neuropathy, type II diabetes mellitus [facility]) 08/24/2015    [facility] [address] [place] PT, DPT 02/18/2020, 12:25 PM  [facility] [address] [place], [place], [place] Phone: [phone]   Fax:  [phone]  Name: Sarah Garrison MRN: Angelina Sheriff Date of Birth: [birth year]

## 2020-02-22 ENCOUNTER — Ambulatory Visit: Payer: Medicaid Other | Admitting: Physical Therapy

## 2020-02-23 ENCOUNTER — Ambulatory Visit: Payer: Medicaid Other | Admitting: Speech Pathology

## 2020-02-23 ENCOUNTER — Other Ambulatory Visit: Payer: Self-pay

## 2020-02-23 DIAGNOSIS — F8081 Childhood onset fluency disorder: Secondary | ICD-10-CM

## 2020-02-23 DIAGNOSIS — F444 Conversion disorder with motor symptom or deficit: Secondary | ICD-10-CM

## 2020-02-23 DIAGNOSIS — M25551 Pain in right hip: Secondary | ICD-10-CM | POA: Diagnosis not present

## 2020-02-23 DIAGNOSIS — R498 Other voice and resonance disorders: Secondary | ICD-10-CM

## 2020-02-23 NOTE — Therapy (Signed)
[facility] [address] [place], [place], [address] Phone: [phone]   Fax:  [phone]  Speech Language Pathology Treatment  Patient Details  Name: Sarah Garrison MRN: [id number] Date of Birth: 07-24-1980 Referring Provider (SLP): Asencion Noble, MD   Encounter Date: 02/23/2020   End of Session - 02/23/20 0940    Visit Number 8    Number of Visits 13    Authorization Type BCBS Medicaid    Authorization Time Period 12 visits approved through 03/11/20    Authorization - Visit Number 7    Authorization - Number of Visits 12    SLP Start Time 7616    SLP Stop Time  0737    SLP Time Calculation (min) 40 min    Activity Tolerance Patient tolerated treatment well           Past Medical History:  Diagnosis Date  . Asthma   . COVID-19   . Diabetes mellitus without complication ([place])   . GERD (gastroesophageal reflux disease)   . Hypertension   . Seizures ([place])     Past Surgical History:  Procedure Laterality Date  . CESAREAN SECTION    . HERNIA REPAIR    . TUBAL LIGATION    . VENTRAL HERNIA REPAIR N/A 11/27/2017   Procedure: [place];  Surgeon: Erroll Luna, MD;  Location: [facility];  Service: General;  Laterality: N/A;  . WISDOM TOOTH EXTRACTION      There were no vitals filed for this visit.   Subjective Assessment - 02/23/20 0938    Subjective "S-[id number]-s-[id number]-t-t-times they do come out."    Pain Score 7     Pain Location Leg    Pain Orientation Right;Left    Pain Descriptors / Indicators Aching;Tightness    Pain Type Acute pain    Aggravating Factors  prolonged sitting                 ADULT SLP TREATMENT - 02/23/20 0939      General Information   Behavior/Cognition Alert;Cooperative      Treatment Provided   Treatment provided Cognitive-Linquistic      Pain Assessment   Pain Assessment No/denies pain      Cognitive-Linquistic Treatment     Treatment focused on Voice;Other (comment)   stuttering   Skilled Treatment Stuttering occuring independent of ?laryngeal spasms when pt entered tx room today on voiceless sounds (/p/ /t/). Reports she has been practicing AB at rest, but not much with speech. Her speech is "about the same." Stuttering and/or possible laryngeal spasms appeared to occurs ~5-6 utterances. Relaxation stretches targeted, followed by abdominal breathing at rest ~80% over 3 minutes. Subsequently pt read recipe using AB; after initial instruction from SLP, pt noted onset of spasm and self-corrected/prevented onset by taking a breath; not all breaths were abdominal (~60%) however with renewed breath support pt able to maintain smooth, steady vocal quality and suppress articulatory stuttering as well. Occasional min cues for timing of phonation (at top of inhale vs after exhale). Increased task complexity by having pt recall/tell SLP about a recipe she made yesterday. Pt told SLP she notices sometimes she taps her foot or her fingers to try to help her get her words to flow. SLP explained secondary behaviors and encouraged pt to place hand on her stomach to remind herself to breathe.      Assessment / Recommendations / Plan   Plan Continue with current plan of care  Progression Toward Goals   Progression toward goals Progressing toward goals            SLP Education - 02/23/20 1127    Education Details abdominal breathing, secondary behaviors with stuttering    Person(s) Educated Patient    Methods Explanation    Comprehension Verbalized understanding;Need further instruction;Returned demonstration;Verbal cues required            SLP Short Term Goals - 02/23/20 0941      SLP SHORT TERM GOAL #1   Title Patient will demonstrate abdominal breathing in isolation 90% accuracy over 5 minutes x 2 sessions.    Baseline chest breathing 100%; 02-10-20    Time 1    Period Weeks    Status Partially Met      SLP SHORT  TERM GOAL #2   Title Pt will use abdominal breathing in 18/20 sentence responses x2 visits with rare min A.    Baseline chest breathing 100%    Time 1    Period Weeks    Status Not Met      SLP SHORT TERM GOAL #3   Title Patient will use compensations for fluency in 5 minutes simple conversation with rare min A x 2 visits.    Baseline not using compensations    Time 1    Period Weeks    Status Not Met            SLP Long Term Goals - 02/23/20 0941      SLP LONG TERM GOAL #1   Title Patient will demonstrate abdominal breathing in 10 minutes simple conversation at least 80% accuracy x2 visits.    Baseline chest breathing 100%    Time 2    Period Weeks    Status On-going      SLP LONG TERM GOAL #2   Title Patient will use fluency compensations in 10 minutes simple conversation independently x 2 visits.    Baseline not using compensations    Time 2    Period Weeks    Status On-going      SLP LONG TERM GOAL #3   Title Patient will report reduced frustration with her communication abilities than prior to [place].    Baseline "very frustrated"    Time 2    Period Weeks    Status On-going            Plan - 02/23/20 0941    Clinical Impression Statement Patient presents with inconsistent/intermittent articulatory and phonatory repetitions which appear to occur with forced expiration. 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 Pt had "stuttering" errors more frequently with phoneme /h/ but also had starting and stopping on words which orthographically contained the letter h but not the phoneme /h/ (eg/ "hour"). Minimal stuttering after AB, continuous phonation tasks. Pattern of errors seems inconsistent with a neurogenic stutter or vocal cord dysfunction. I feel pt would benefit from course of skilled ST for respiratory-based interventions as well as fluency compensations help to improve her speech at the  conversation level.    Speech Therapy Frequency 2x / week    Duration --   6 weeks or 13 total visits   Treatment/Interventions Environmental controls;Cueing hierarchy;SLP instruction and feedback;Functional tasks;Compensatory strategies;Patient/family education;Other (comment)   abdominal breathing   Potential to Achieve Goals Good    SLP Home Exercise Plan abdominal breathing provided    Consulted and Agree with Plan of  Care Patient           Patient will benefit from skilled therapeutic intervention in order to improve the following deficits and impairments:   Stuttering  Functional voice disorder  Other voice and resonance disorders    Problem List Patient Active Problem List   Diagnosis Date Noted  . Hip pain 12/08/2019  . Hyperlipidemia associated with type 2 diabetes mellitus ([place]) 11/23/2019  . History of COVID-19 10/27/2019  . Stuttering 10/27/2019  . Depression with anxiety 10/27/2019  . Obesity, Class III, BMI 40-49.9 (morbid obesity) ([place]) 06/30/2019  . OSA (obstructive sleep apnea) 02/16/2019  . Essential hypertension 06/22/2018  . Uncontrolled type 2 diabetes mellitus with complication, with long-term current use of insulin ([place]) 12/13/2016  . Asthma 07/30/2016  . 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/07/2016  . DM neuropathy, type II diabetes mellitus ([place]) 08/24/2015   Deneise Lever, [place], CCC-SLP Speech-Language Pathologist  Aliene Altes 02/23/2020, 11:28 AM  [facility] [address] [place], [place], [address] Phone: [phone]   Fax:  [phone]   Name: ZIANNA DERCOLE MRN: [id number] Date of Birth: 04-Aug-1980

## 2020-02-24 ENCOUNTER — Ambulatory Visit: Payer: Medicaid Other | Admitting: Physical Therapy

## 2020-02-24 ENCOUNTER — Encounter: Payer: Self-pay | Admitting: Physical Therapy

## 2020-02-24 DIAGNOSIS — M2560 Stiffness of unspecified joint, not elsewhere classified: Secondary | ICD-10-CM

## 2020-02-24 DIAGNOSIS — G8929 Other chronic pain: Secondary | ICD-10-CM

## 2020-02-24 DIAGNOSIS — M25511 Pain in right shoulder: Secondary | ICD-10-CM

## 2020-02-24 DIAGNOSIS — R2689 Other abnormalities of gait and mobility: Secondary | ICD-10-CM

## 2020-02-24 DIAGNOSIS — M25551 Pain in right hip: Secondary | ICD-10-CM | POA: Diagnosis not present

## 2020-02-24 NOTE — Therapy (Signed)
[facility] [address] [place], [place], [address] Phone: [phone]   Fax:  [phone]  Physical Therapy Treatment  Patient Details  Name: Sarah Garrison MRN: [id number] Date of Birth: 1981/05/02 Referring Provider (PT): Cristie Hem, [place]   Encounter Date: 02/24/2020   PT End of Session - 02/24/20 0839    Visit Number 12    Number of Visits 20    Date for PT Re-Evaluation 03/17/20    Authorization Type MED PAY ASSURANCE;  MEDICAID HEALTHY BLUE    Authorization Time Period 3 visits from 8/10-9/14/21, 12 visits from 02/10/20-03/09/20; physician re-cert until 13/24/40    Authorization - Visit Number 5    Authorization - Number of Visits 12    PT Start Time [phone]    PT Stop Time 0915    PT Time Calculation (min) 43 min    Activity Tolerance Patient tolerated treatment well    Behavior During Therapy [facility] for tasks assessed/performed           Past Medical History:  Diagnosis Date  . Asthma   . COVID-19   . Diabetes mellitus without complication ([facility])   . GERD (gastroesophageal reflux disease)   . Hypertension   . Seizures ([facility])     Past Surgical History:  Procedure Laterality Date  . CESAREAN SECTION    . HERNIA REPAIR    . TUBAL LIGATION    . VENTRAL HERNIA REPAIR N/A 11/27/2017   Procedure: VENTRAL HERNIA REPAIR [facility];  Surgeon: Harriette Bouillon, MD;  Location: [facility];  Service: General;  Laterality: N/A;  . WISDOM TOOTH EXTRACTION      There were no vitals filed for this visit.   Subjective Assessment - 02/24/20 0836    Subjective Pt reports R knee and front of thigh hurts. Pt notes her blood sugar is high this AM and is feeling tired. Pt reports her pain had worsened last Sunday night after her work.    Pertinent History Covid !(    Limitations Sitting;Lifting;Standing;Walking;House hold activities    How long can you sit comfortably? no issue    How long can you stand  comfortably? 10-15 mins    How long can you walk comfortably? Unable    Diagnostic tests Hips 09/05/19:FINDINGS:Frontal view of the pelvis as well as frontal and frogleg lateral views of the right hip are obtained. No acute displaced fracture.Mild symmetrical bilateral hip joint space narrowing compatible with osteoarthritis. Soft tissues are unremarkable.    Patient Stated Goals For my pain to get better    Currently in Pain? Yes    Pain Score 10-Worst pain ever                             [facility] Adult PT Treatment/Exercise - 02/24/20 0001      Knee/Hip Exercises: Stretches   Passive Hamstring Stretch Both;20 seconds    Quad Stretch Both;20 seconds    Hip Flexor Stretch Both;20 seconds    Knee: Self-Stretch Limitations 10x 3 sec stretch    Other Knee/Hip Stretches single knee to chest x20 sec      Knee/Hip Exercises: Aerobic   Nustep L2 x 6 min      Knee/Hip Exercises: Standing   Forward Lunges Both;10 reps;2 sets    Hip Abduction Stengthening;Both;20 reps    Abduction Limitations green tband    Hip Extension Stengthening;Both;20 reps    Extension Limitations green  tband    Rocker Board 1 minute    SLS x30 sec bilat    Other Standing Knee Exercises Slow march x10      Shoulder Exercises: Standing   Retraction Strengthening;Both;10 reps    Other Standing Exercises overhead reach to first level of cabinet 2# x10, second shelf 2# x10    Other Standing Exercises shoulder rolls x10                    PT Short Term Goals - 02/10/20 1018      PT SHORT TERM GOAL #1   Title Pt will be ind in an initail HEP    Time 3    Period Weeks    Status Achieved      PT SHORT TERM GOAL #2   Title Ptwill voice understanding/return demonstration of measure to reduce and manage R hip pain    Time 3    Period Weeks    Status On-going      PT SHORT TERM GOAL #3   Title Establish LTGs following the initial 4 visits of PT care    Time 3    Period Weeks    Status  Achieved      PT SHORT TERM GOAL #4   Title Pt will be independent with self correcting her forward head and rounded shoulder posture    Baseline Needs cueing    Time 3    Period Weeks    Status On-going             PT Long Term Goals - 02/18/20 1144      PT LONG TERM GOAL #1   Title pt will be independent with advanced HEP    Time 4    Period Weeks    Status Achieved      PT LONG TERM GOAL #2   Title Pt will have full pain free shoulder ROM    Baseline improved, see objective measures    Time 4    Period Weeks    Status Achieved      PT LONG TERM GOAL #3   Title Pt will be able to lift 10 lbs overhead for home tasks    Time 4    Period Weeks    Status On-going      PT LONG TERM GOAL #4   Title Pt will be able to stand comfortably for 30 minutes to be able to perform household chores    Baseline 15-20 minutes    Time 4    Period Weeks    Status On-going      PT LONG TERM GOAL #5   Title Pt will be able to amb around the grocery store without discomfort    Time 4    Period Weeks    Status On-going                 Plan - 02/24/20 4132    Clinical Impression Statement Pt with increased R hip/knee pain today due to her work with blood sugar at 386 causing increased fatigue. Treatment focused on gentle LE ROM, stretching, and strengthening. Discussed with pt to continue her stretching for her R UE (she states she has been doing this) and modified HEP to include increased LE strengthening.    Personal Factors and Comorbidities Time since onset of injury/illness/exacerbation;Comorbidity 3+    Comorbidities Hx of Covid-19, DM, asthma    Examination-Activity Limitations Bathing;Bend;Caring for Others;Carry;Dressing;Lift;Toileting;Stand;Stairs;Squat;Sleep;Sit;Reach Overhead;Locomotion Level;Transfers  Stability/Clinical Decision Making Evolving/Moderate complexity    Rehab Potential Fair    PT Frequency 2x / week    PT Duration 4 weeks    PT  Treatment/Interventions ADLs/Self Care Home Management;Cryotherapy;Electrical Stimulation;Ultrasound;Traction;Moist Heat;Iontophoresis 4mg /ml Dexamethasone;Gait training;Stair training;Functional mobility training;Therapeutic activities;Therapeutic exercise;Manual techniques;Patient/family education;Passive range of motion;Dry needling;Taping;Vasopneumatic Device    PT Next Visit Plan Continue to progress bilat LE and UE strengthening. Work on overhead. Work on standing tolerance. Consider leg press machine    PT Home Exercise Plan Access Code: - 3 way hip with green tband, quad stretch    Consulted and Agree with Plan of Care Patient           Patient will benefit from skilled therapeutic intervention in order to improve the following deficits and impairments:  Abnormal gait, Difficulty walking, Decreased range of motion, Obesity, Decreased activity tolerance, Pain, Impaired flexibility, Decreased mobility, Decreased strength, Postural dysfunction  Visit Diagnosis: Chronic right shoulder pain  Limited joint range of motion (ROM)  Chronic hip pain, right  Other abnormalities of gait and mobility     Problem List Patient Active Problem List   Diagnosis Date Noted  . Hip pain 12/08/2019  . Hyperlipidemia associated with type 2 diabetes mellitus (HCC) 11/23/2019  . History of COVID-19 10/27/2019  . Stuttering 10/27/2019  . Depression with anxiety 10/27/2019  . Obesity, Class III, BMI 40-49.9 (morbid obesity) (HCC) 06/30/2019  . OSA (obstructive sleep apnea) 02/16/2019  . Essential hypertension 06/22/2018  . Uncontrolled type 2 diabetes mellitus with complication, with long-term current use of insulin (HCC) 12/13/2016  . Asthma 07/30/2016  . 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/07/2016  . DM neuropathy, type II diabetes mellitus [facility]) 08/24/2015    [facility] [address] PT, DPT 02/24/2020, 9:19 AM  [facility] [address] [place], [place], [place] Phone: [phone]   Fax:  [phone]  Name: Sarah Garrison MRN: Angelina Sheriff Date of Birth: 1980/09/15

## 2020-02-25 ENCOUNTER — Ambulatory Visit: Payer: Medicaid Other | Admitting: Speech Pathology

## 2020-02-25 ENCOUNTER — Other Ambulatory Visit: Payer: Self-pay

## 2020-02-25 DIAGNOSIS — M25551 Pain in right hip: Secondary | ICD-10-CM | POA: Diagnosis not present

## 2020-02-25 DIAGNOSIS — F444 Conversion disorder with motor symptom or deficit: Secondary | ICD-10-CM

## 2020-02-25 DIAGNOSIS — F8081 Childhood onset fluency disorder: Secondary | ICD-10-CM

## 2020-02-25 DIAGNOSIS — R498 Other voice and resonance disorders: Secondary | ICD-10-CM

## 2020-02-25 NOTE — Therapy (Signed)
[facility] [address] [place], [place], [address] Phone: [phone]   Fax:  [phone]  Speech Language Pathology Treatment  Patient Details  Name: Sarah Garrison MRN: [id number] Date of Birth: July 06, 1980 Referring Provider (SLP): Asencion Noble, MD   Encounter Date: 02/25/2020   End of Session - 02/25/20 1013    Visit Number 9    Number of Visits 13    Date for SLP Re-Evaluation 03/07/20    Authorization Type BCBS Medicaid    Authorization - Visit Number 8    Authorization - Number of Visits 12    SLP Start Time [phone]    SLP Stop Time  1010    SLP Time Calculation (min) 36 min    Activity Tolerance Patient tolerated treatment well           Past Medical History:  Diagnosis Date  . Asthma   . COVID-19   . Diabetes mellitus without complication ([place])   . GERD (gastroesophageal reflux disease)   . Hypertension   . Seizures ([place])     Past Surgical History:  Procedure Laterality Date  . CESAREAN SECTION    . HERNIA REPAIR    . TUBAL LIGATION    . VENTRAL HERNIA REPAIR N/A 11/27/2017   Procedure: [place];  Surgeon: Erroll Luna, MD;  Location: [facility];  Service: General;  Laterality: N/A;  . WISDOM TOOTH EXTRACTION      There were no vitals filed for this visit.   Subjective Assessment - 02/25/20 0937    Subjective Reports trying to take breaths when talking.    Currently in Pain? Yes    Pain Score 8     Pain Location Groin    Pain Orientation Right    Pain Descriptors / Indicators Aching                 ADULT SLP TREATMENT - 02/25/20 0938      General Information   Behavior/Cognition Alert;Cooperative      Treatment Provided   Treatment provided Cognitive-Linquistic      Pain Assessment   Pain Assessment No/denies pain      Cognitive-Linquistic Treatment   Treatment focused on Voice;Other (comment)    Skilled Treatment Pt  independently completed relaxation, initiated AB with min cues.  AB at rest 90% accuracy. SLP engaged pt in mod complex conversation; pt took breath to suppress/recover from stutters/spasms 80% of the time, with min cues remaining 20% of the time. Pt continues to report feeling of spasm, tightness, soreness in throat. SLP continues to educate pt that ENT consult may be beneficial for further diagnosis; she defers at this time but states she will consider this.       Assessment / Recommendations / Plan   Plan Continue with current plan of care      Progression Toward Goals   Progression toward goals Progressing toward goals            SLP Education - 02/25/20 1013    Education Details possible benefits of ENT consult    Person(s) Educated Patient    Methods Explanation    Comprehension Verbalized understanding            SLP Short Term Goals - 02/25/20 1014      SLP SHORT TERM GOAL #1   Title Patient will demonstrate abdominal breathing in isolation 90% accuracy over 5 minutes x 2 sessions.    Baseline chest  breathing 100%; 02-10-20    Time 1    Period Weeks    Status Partially Met      SLP SHORT TERM GOAL #2   Title Pt will use abdominal breathing in 18/20 sentence responses x2 visits with rare min A.    Baseline chest breathing 100%    Time 1    Period Weeks    Status Not Met      SLP SHORT TERM GOAL #3   Title Patient will use compensations for fluency in 5 minutes simple conversation with rare min A x 2 visits.    Baseline not using compensations    Time 1    Period Weeks    Status Not Met            SLP Long Term Goals - 02/25/20 1014      SLP LONG TERM GOAL #1   Title Patient will demonstrate abdominal breathing in 10 minutes simple conversation at least 80% accuracy x2 visits.    Baseline 02/25/20    Time 2    Period Weeks    Status On-going      SLP LONG TERM GOAL #2   Title Patient will use fluency compensations in 10 minutes simple conversation  independently x 2 visits.    Baseline 02/25/20    Time 2    Period Weeks    Status On-going      SLP LONG TERM GOAL #3   Title Patient will report reduced frustration with her communication abilities than prior to [place].    Baseline "very frustrated"    Time 2    Period Weeks    Status On-going            Plan - 02/25/20 1014    Clinical Impression Statement Patient presents with intermittent articulatory and phonatory repetitions which appear to occur with forced expiration. Patient increasing accuracy with abdominal breathing and is using AB 80% of the time today to prevent/suppress stutters and spasms. Anticipate d/c in next 1-2 visits. I feel pt would benefit from course of skilled ST for respiratory-based interventions as well as fluency compensations help to improve her speech at the conversation level.    Speech Therapy Frequency 2x / week    Duration --   6 weeks or 13 total visits   Treatment/Interventions Environmental controls;Cueing hierarchy;SLP instruction and feedback;Functional tasks;Compensatory strategies;Patient/family education;Other (comment)   abdominal breathing   Potential to Achieve Goals Good    SLP Home Exercise Plan abdominal breathing provided    Consulted and Agree with Plan of Care Patient           Patient will benefit from skilled therapeutic intervention in order to improve the following deficits and impairments:   Functional voice disorder  Stuttering  Other voice and resonance disorders    Problem List Patient Active Problem List   Diagnosis Date Noted  . Hip pain 12/08/2019  . Hyperlipidemia associated with type 2 diabetes mellitus ([place]) 11/23/2019  . History of COVID-19 10/27/2019  . Stuttering 10/27/2019  . Depression with anxiety 10/27/2019  . Obesity, Class III, BMI 40-49.9 (morbid obesity) ([place]) 06/30/2019  . OSA (obstructive sleep apnea) 02/16/2019  . Essential hypertension 06/22/2018  . Uncontrolled type 2 diabetes mellitus  with complication, with long-term current use of insulin ([place]) 12/13/2016  . Asthma 07/30/2016  . 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/07/2016  . DM neuropathy, type II diabetes mellitus ([place]) 08/24/2015   Deneise Lever, [place], [place] E Jori Thrall  02/25/2020, 10:16 AM  [facility] [address] [place], [place], [address] Phone: [phone]   Fax:  [phone]   Name: Sarah Garrison MRN: [id number] Date of Birth: March 16, 1981

## 2020-03-01 ENCOUNTER — Encounter (HOSPITAL_COMMUNITY): Payer: Self-pay | Admitting: Emergency Medicine

## 2020-03-01 ENCOUNTER — Other Ambulatory Visit: Payer: Self-pay

## 2020-03-01 ENCOUNTER — Ambulatory Visit: Payer: Medicaid Other | Admitting: Speech Pathology

## 2020-03-01 ENCOUNTER — Ambulatory Visit (HOSPITAL_COMMUNITY)
Admission: EM | Admit: 2020-03-01 | Discharge: 2020-03-01 | Disposition: A | Discharge status: Home / Self Care | Payer: Medicaid Other | Attending: Emergency Medicine | Admitting: Emergency Medicine

## 2020-03-01 DIAGNOSIS — M25512 Pain in left shoulder: Secondary | ICD-10-CM

## 2020-03-01 DIAGNOSIS — M79645 Pain in left finger(s): Secondary | ICD-10-CM

## 2020-03-01 DIAGNOSIS — S46812A Strain of other muscles, fascia and tendons at shoulder and upper arm level, left arm, initial encounter: Secondary | ICD-10-CM | POA: Diagnosis not present

## 2020-03-01 DIAGNOSIS — F444 Conversion disorder with motor symptom or deficit: Secondary | ICD-10-CM

## 2020-03-01 DIAGNOSIS — R498 Other voice and resonance disorders: Secondary | ICD-10-CM

## 2020-03-01 MED ORDER — KETOROLAC TROMETHAMINE 30 MG/ML IJ SOLN
30.0000 mg | Freq: Once | INTRAMUSCULAR | Status: AC
  Administered 2020-03-01: 30 mg via INTRAMUSCULAR

## 2020-03-01 MED ORDER — KETOROLAC TROMETHAMINE 30 MG/ML IJ SOLN
INTRAMUSCULAR | Status: AC
  Filled 2020-03-01: qty 1

## 2020-03-01 MED ORDER — CYCLOBENZAPRINE HCL 5 MG PO TABS: 5.0000 mg | ORAL_TABLET | Freq: Two times a day (BID) | ORAL | 0 refills | Status: DC | PRN

## 2020-03-01 MED ORDER — DICLOFENAC SODIUM 50 MG PO TBEC: 50.0000 mg | DELAYED_RELEASE_TABLET | Freq: Two times a day (BID) | ORAL | 0 refills | Status: DC

## 2020-03-01 MED FILL — DICLOFENAC SOD EC 50 MG TAB: 50 | 10 days supply | Qty: 20 | Fill #0

## 2020-03-01 MED FILL — TRUEplus 5-BEVEL PEN NEEDLE: 32G X 4 MM | 30 days supply | Qty: 100 | Fill #0

## 2020-03-01 MED FILL — CYCLOBENZAPRINE 5 MG TABLET: 5 | 6 days supply | Qty: 24 | Fill #0
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
## 2020-03-01 NOTE — Therapy (Signed)
Pt crying from pain in left shoulder/arm onset last night. SLP offered option to reschedule session; pt initially wanted to procede but had difficulty focusing on session and opted to leave to go to Urgent Care for evaluation.  Rondel Baton, MS, CCC-SLP Speech-Language Pathologist   [facility] [address] [place], [place], [address] Phone: [phone]   Fax:  [phone]  Patient Details  Name: JAKIAH BIENAIME MRN: [id number] Date of Birth: 01/16/1981 Referring Provider:  Cain Saupe, MD  Encounter Date: 03/01/2020   Arlana Lindau 03/01/2020, 9:50 AM  [facility] [address] [place], [place], [address] Phone: [phone]   Fax:  [phone]

## 2020-03-01 NOTE — Discharge Instructions (Signed)
We gave you a shot of Toradol Continue with diclofenac with food twice daily for pain as alternative to Naprosyn You may use flexeril as needed to help with pain. This is a muscle relaxer and causes sedation- please use only at bedtime or when you will be home and not have to drive/work Alternate ice and heat to back Gentle stretching and exercises-see attached; avoid complete rest of shoulder and finger to avoid any additional stiffness Follow-up with physical therapy and orthopedics for persistent problems
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
## 2020-03-02 ENCOUNTER — Encounter: Payer: Self-pay | Admitting: Physical Therapy

## 2020-03-02 ENCOUNTER — Ambulatory Visit: Payer: Medicaid Other | Admitting: Physical Therapy

## 2020-03-02 DIAGNOSIS — M25551 Pain in right hip: Secondary | ICD-10-CM | POA: Diagnosis not present

## 2020-03-02 DIAGNOSIS — M25511 Pain in right shoulder: Secondary | ICD-10-CM

## 2020-03-02 DIAGNOSIS — R2689 Other abnormalities of gait and mobility: Secondary | ICD-10-CM

## 2020-03-02 DIAGNOSIS — G8929 Other chronic pain: Secondary | ICD-10-CM

## 2020-03-02 DIAGNOSIS — M2560 Stiffness of unspecified joint, not elsewhere classified: Secondary | ICD-10-CM

## 2020-03-02 NOTE — Therapy (Signed)
[facility] [address] [place], [place], [address] Phone: [phone]   Fax:  [phone]  Physical Therapy Treatment  Patient Details  Name: Sarah Garrison MRN: [id number] Date of Birth: [birth year] Referring Provider (PT): Cristie Hem, [place]   Encounter Date: 03/02/2020   PT End of Session - 03/02/20 1144    Visit Number 13    Number of Visits 20    Date for PT Re-Evaluation 03/17/20    Authorization Type MED PAY ASSURANCE; [place] MEDICAID [employer] BLUE    Authorization Time Period 3 visits from 8/10-9/14/21, 12 visits from 02/10/20-03/09/20; physician re-cert until 76/28/31    Authorization - Visit Number 6    Authorization - Number of Visits 12    PT Start Time 1105   pt arrived late   PT Stop Time 1143    PT Time Calculation (min) 38 min    Activity Tolerance Patient limited by pain    Behavior During Therapy Anxious           Past Medical History:  Diagnosis Date  . Asthma   . COVID-19   . Diabetes mellitus without complication (HCC)   . GERD (gastroesophageal reflux disease)   . Hypertension   . Seizures (HCC)     Past Surgical History:  Procedure Laterality Date  . CESAREAN SECTION    . HERNIA REPAIR    . TUBAL LIGATION    . VENTRAL HERNIA REPAIR N/A 11/27/2017   Procedure: VENTRAL HERNIA REPAIR ERAS PATHWAY;  Surgeon: Harriette Bouillon, MD;  Location: [facility];  Service: General;  Laterality: N/A;  . WISDOM TOOTH EXTRACTION      There were no vitals filed for this visit.   Subjective Assessment - 03/02/20 1110    Subjective My right shoulder hurts a little but not like the left one.    Patient Stated Goals For my pain to get better    Currently in Pain? Yes    Pain Score 4     Pain Location Shoulder    Pain Orientation Right    Pain Descriptors / Indicators --   it just hurts   Aggravating Factors  bra strap    Pain Score 8    Pain Location Shoulder    Pain Orientation Left     Pain Descriptors / Indicators Tightness                             [facility] Adult PT Treatment/Exercise - 03/02/20 0001      Shoulder Exercises: Seated   Other Seated Exercises cervical rotation, shoulder rolls, thoracic extensions over chair    Other Seated Exercises UE ranger circles Lt UE with cues to reduce Rt GHJ elevation                  PT Education - 03/02/20 1153    Education Details hypersensitivity, posture, sling, OT for finger    Person(s) Educated Patient    Methods Explanation    Comprehension Verbalized understanding;Need further instruction            PT Short Term Goals - 02/10/20 1018      PT SHORT TERM GOAL #1   Title Pt will be ind in an initail HEP    Time 3    Period Weeks    Status Achieved      PT SHORT TERM GOAL #2   Title Ptwill voice understanding/return  demonstration of measure to reduce and manage R hip pain    Time 3    Period Weeks    Status On-going      PT SHORT TERM GOAL #3   Title Establish LTGs following the initial 4 visits of PT care    Time 3    Period Weeks    Status Achieved      PT SHORT TERM GOAL #4   Title Pt will be independent with self correcting her forward head and rounded shoulder posture    Baseline Needs cueing    Time 3    Period Weeks    Status On-going             PT Long Term Goals - 02/18/20 1144      PT LONG TERM GOAL #1   Title pt will be independent with advanced HEP    Time 4    Period Weeks    Status Achieved      PT LONG TERM GOAL #2   Title Pt will have full pain free shoulder ROM    Baseline improved, see objective measures    Time 4    Period Weeks    Status Achieved      PT LONG TERM GOAL #3   Title Pt will be able to lift 10 lbs overhead for home tasks    Time 4    Period Weeks    Status On-going      PT LONG TERM GOAL #4   Title Pt will be able to stand comfortably for 30 minutes to be able to perform household chores    Baseline 15-20 minutes     Time 4    Period Weeks    Status On-going      PT LONG TERM GOAL #5   Title Pt will be able to amb around the grocery store without discomfort    Time 4    Period Weeks    Status On-going                 Plan - 03/02/20 1151    Clinical Impression Statement Pt arrived in sling for Lt UE today and significant elevation of Rt GHJ. I strongly encouraged her to avoid wearing sling and to move frequently to reduce tension and stiffness. Very minimal tolerance to manual therapy today but was able to demo full cervical rotation at the end of treatment. Is getting her nails done today so we discussed posture while she is there.    PT Treatment/Interventions ADLs/Self Care Home Management;Cryotherapy;Electrical Stimulation;Ultrasound;Traction;Moist Heat;Iontophoresis 4mg /ml Dexamethasone;Gait training;Stair training;Functional mobility training;Therapeutic activities;Therapeutic exercise;Manual techniques;Patient/family education;Passive range of motion;Dry needling;Taping;Vasopneumatic Device    PT Next Visit Plan Continue to progress bilat LE and UE strengthening. Work on overhead. Work on standing tolerance. Consider leg press machine    PT Home Exercise Plan Access Code: - 3 way hip with green tband, quad stretch    Consulted and Agree with Plan of Care Patient           Patient will benefit from skilled therapeutic intervention in order to improve the following deficits and impairments:  Abnormal gait, Difficulty walking, Decreased range of motion, Obesity, Decreased activity tolerance, Pain, Impaired flexibility, Decreased mobility, Decreased strength, Postural dysfunction  Visit Diagnosis: Chronic right shoulder pain  Limited joint range of motion (ROM)  Chronic hip pain, right  Other abnormalities of gait and mobility     Problem List Patient Active Problem List   Diagnosis  Date Noted  . Hip pain 12/08/2019  . Hyperlipidemia associated with type 2 diabetes  mellitus (HCC) 11/23/2019  . History of COVID-19 10/27/2019  . Stuttering 10/27/2019  . Depression with anxiety 10/27/2019  . Obesity, Class III, BMI 40-49.9 (morbid obesity) (HCC) 06/30/2019  . OSA (obstructive sleep apnea) 02/16/2019  . Essential hypertension 06/22/2018  . Uncontrolled type 2 diabetes mellitus with complication, with long-term current use of insulin (HCC) 12/13/2016  . Asthma 07/30/2016  . 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/07/2016  . DM neuropathy, type II diabetes mellitus (HCC) 08/24/2015    Ellery Meroney C. Turhan Chill PT, DPT 03/02/20 12:32 PM   [facility] [address] [place], [place], [address] Phone: [phone]   Fax:  [phone]  Name: Sarah Garrison MRN: [id number] Date of Birth: 10-22-1980

## 2020-03-03 ENCOUNTER — Ambulatory Visit: Payer: Medicaid Other | Admitting: Speech Pathology

## 2020-03-03 ENCOUNTER — Other Ambulatory Visit: Payer: Self-pay

## 2020-03-03 DIAGNOSIS — F8081 Childhood onset fluency disorder: Secondary | ICD-10-CM

## 2020-03-03 DIAGNOSIS — R498 Other voice and resonance disorders: Secondary | ICD-10-CM

## 2020-03-03 DIAGNOSIS — F444 Conversion disorder with motor symptom or deficit: Secondary | ICD-10-CM

## 2020-03-03 DIAGNOSIS — M25551 Pain in right hip: Secondary | ICD-10-CM | POA: Diagnosis not present

## 2020-03-03 NOTE — Therapy (Signed)
[facility] [address] [place], [place], [address] Phone: [phone]   Fax:  [phone]  Speech Language Pathology Treatment and Discharge Summary  Patient Details  Name: Sarah Garrison MRN: [id number] Date of Birth: November 23, 1980 Referring Provider (SLP): Asencion Noble, MD   Encounter Date: 03/03/2020   End of Session - 03/03/20 1004    Visit Number 10    Number of Visits 13    Date for SLP Re-Evaluation 03/07/20    Authorization Type [employer] Medicaid    Authorization - Visit Number 9    Authorization - Number of Visits 12    SLP Start Time [phone]    SLP Stop Time  1000    SLP Time Calculation (min) 29 min    Activity Tolerance Patient tolerated treatment well           Past Medical History:  Diagnosis Date  . Asthma   . COVID-19   . Diabetes mellitus without complication ([place])   . GERD (gastroesophageal reflux disease)   . Hypertension   . Seizures ([place])     Past Surgical History:  Procedure Laterality Date  . CESAREAN SECTION    . HERNIA REPAIR    . TUBAL LIGATION    . VENTRAL HERNIA REPAIR N/A 11/27/2017   Procedure: [place];  Surgeon: Erroll Luna, MD;  Location: [facility];  Service: General;  Laterality: N/A;  . WISDOM TOOTH EXTRACTION      There were no vitals filed for this visit.   Subjective Assessment - 03/03/20 0938    Currently in Pain? Yes    Pain Score 5     Pain Location Shoulder    Pain Orientation Left    Pain Descriptors / Indicators Aching    Pain Type Acute pain    Aggravating Factors  movement                 ADULT SLP TREATMENT - 03/03/20 0939      General Information   Behavior/Cognition Alert;Cooperative      Treatment Provided   Treatment provided Cognitive-Linquistic      Pain Assessment   Pain Assessment No/denies pain      Cognitive-Linquistic Treatment   Treatment focused on Voice;Other (comment)     Skilled Treatment Pt reports she is able to control her speech better, less frustration with communication. Discussed options for follow-up, if she opts for ENT work-up and it reveals vocal fold pathology that she would be welcome to return for further ST. Pt used compensations for fluency and abdominal breathing independently in 15 minutes mod complex conversation, with stuttering/spasms noted primarily on /h/ sounds, only 4 brief notable instances; pt paused for breath to suppress/prevent >90% of the time. Approx 30 minutes into session, pt stated she was ready to go, so ended session early. Pt in agreement with d/c at this time.      Assessment / Recommendations / Plan   Plan Continue with current plan of care      Progression Toward Goals   Progression toward goals Progressing toward goals            SLP Education - 03/03/20 1004    Education Details ENT can identify if any vocal cord pathology if pt wishes to consider this    Person(s) Educated Patient    Methods Explanation;Handout    Comprehension Verbalized understanding            SLP Short  Term Goals - 03/03/20 0940      SLP SHORT TERM GOAL #1   Title Patient will demonstrate abdominal breathing in isolation 90% accuracy over 5 minutes x 2 sessions.    Baseline chest breathing 100%; 02-10-20    Time 1    Period Weeks    Status Partially Met      SLP SHORT TERM GOAL #2   Title Pt will use abdominal breathing in 18/20 sentence responses x2 visits with rare min A.    Baseline chest breathing 100%    Time 1    Period Weeks    Status Not Met      SLP SHORT TERM GOAL #3   Title Patient will use compensations for fluency in 5 minutes simple conversation with rare min A x 2 visits.    Baseline not using compensations    Time 1    Period Weeks    Status Not Met            SLP Long Term Goals - 03/03/20 0940      SLP LONG TERM GOAL #1   Title Patient will demonstrate abdominal breathing in 10 minutes simple  conversation at least 80% accuracy x2 visits.    Baseline 02/25/20 03/03/20    Time 2    Period Weeks    Status Achieved      SLP LONG TERM GOAL #2   Title Patient will use fluency compensations in 10 minutes simple conversation independently x 2 visits.    Baseline 02/25/20 03/03/20    Time 2    Period Weeks    Status Achieved      SLP LONG TERM GOAL #3   Title Patient will report reduced frustration with her communication abilities than prior to [place].    Baseline "very frustrated"    Time 2    Period Weeks    Status Achieved            Plan - 03/03/20 1004    Clinical Impression Statement Patient presents with intermittent articulatory and phonatory repetitions which appear to occur with forced expiration. She is successfully using fluency compensations and abdominal breathing in conversation 90% of the time; only 4 mild stutters/spasms (primarily /h/ initial words) in 15 minutes mod complex conversation. Patient is pleased with current functional level; reports reduced frustration with communication and is in agreement with d/c at this time. LTGs met. She does not wish to pursue ENT consult at this time, but was educated this may be helpful in identifying whether there are any issues with her vocal cords.    Speech Therapy Frequency --   d/c   Duration --   d/c   Treatment/Interventions Environmental controls;Cueing hierarchy;SLP instruction and feedback;Functional tasks;Compensatory strategies;Patient/family education;Other (comment)   abdominal breathing   Potential to Achieve Goals Good    SLP Home Exercise Plan abdominal breathing provided    Consulted and Agree with Plan of Care Patient           Patient will benefit from skilled therapeutic intervention in order to improve the following deficits and impairments:   Other voice and resonance disorders  Functional voice disorder  Stuttering    Problem List Patient Active Problem List   Diagnosis Date Noted  . Hip pain  12/08/2019  . Hyperlipidemia associated with type 2 diabetes mellitus ([place]) 11/23/2019  . History of COVID-19 10/27/2019  . Stuttering 10/27/2019  . Depression with anxiety 10/27/2019  . Obesity, Class III, BMI 40-49.9 (morbid obesity) (  [place]) 06/30/2019  . OSA (obstructive sleep apnea) 02/16/2019  . Essential hypertension 06/22/2018  . Uncontrolled type 2 diabetes mellitus with complication, with long-term current use of insulin ([place]) 12/13/2016  . Asthma 07/30/2016  . 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/07/2016  . DM neuropathy, type II diabetes mellitus ([place]) 08/24/2015   SPEECH THERAPY DISCHARGE SUMMARY  Visits from Start of Care: 10  Current functional level related to goals / functional outcomes: Long terms goals met; pt using compensations for fluency and abdominal breathing in 15 minutes conversation.   Remaining deficits: Occasional articulatory stuttering and ?vocal cord spasms, more common with forced expiration.    Education / Equipment: Fluency compensations, abdominal breathing, consider ENT evaluation Plan: Patient agrees to discharge.  Patient goals were met. Patient is being discharged due to meeting the stated rehab goals.  ?????        Deneise Lever, [place], CCC-SLP Speech-Language Pathologist  Aliene Altes 03/03/2020, 10:07 AM  [facility] [address] [place], [place], [address] Phone: [phone]   Fax:  [phone]   Name: Sarah Garrison MRN: [id number] Date of Birth: April 15, 1981

## 2020-03-03 NOTE — Patient Instructions (Signed)
If you are curious about the "vibration" you are feeling in your throat, you could consider seeing an ENT (Ear, Nose and Throat) doctor to evaluate your vocal cords. If you do this, and they feel you would benefit from more speech therapy, we would happy to see you again!

## 2020-03-04 ENCOUNTER — Ambulatory Visit: Payer: Medicaid Other | Admitting: Physical Therapy

## 2020-03-07 ENCOUNTER — Other Ambulatory Visit: Payer: Self-pay | Admitting: Family Medicine

## 2020-03-07 ENCOUNTER — Encounter: Payer: Medicaid Other | Admitting: Physical Therapy

## 2020-03-07 DIAGNOSIS — G8929 Other chronic pain: Secondary | ICD-10-CM

## 2020-03-07 DIAGNOSIS — M25512 Pain in left shoulder: Secondary | ICD-10-CM
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
## 2020-03-07 NOTE — Progress Notes (Signed)
Patient ID: Sarah Garrison, female   DOB: Dec 04, 1980, 39 y.o.   MRN: [id number]   Message received from [facility] that patient may benefit from splinting to help with left hand pain- OT referral for splinting placed

## 2020-03-09 ENCOUNTER — Other Ambulatory Visit: Payer: Self-pay

## 2020-03-09 ENCOUNTER — Ambulatory Visit: Payer: Medicaid Other | Admitting: Physical Therapy

## 2020-03-09 ENCOUNTER — Encounter: Payer: Self-pay | Admitting: Physical Therapy

## 2020-03-09 ENCOUNTER — Ambulatory Visit: Payer: Medicaid Other | Attending: Physician Assistant | Admitting: Physical Therapy

## 2020-03-09 DIAGNOSIS — G8929 Other chronic pain: Secondary | ICD-10-CM | POA: Insufficient documentation

## 2020-03-09 DIAGNOSIS — M25511 Pain in right shoulder: Secondary | ICD-10-CM | POA: Insufficient documentation

## 2020-03-09 DIAGNOSIS — M25551 Pain in right hip: Secondary | ICD-10-CM | POA: Diagnosis present

## 2020-03-09 DIAGNOSIS — M2560 Stiffness of unspecified joint, not elsewhere classified: Secondary | ICD-10-CM | POA: Diagnosis present

## 2020-03-09 DIAGNOSIS — R2689 Other abnormalities of gait and mobility: Secondary | ICD-10-CM | POA: Diagnosis present

## 2020-03-09 NOTE — Therapy (Signed)
[facility] [place], [place], [address] Phone: [phone]   Fax:  [phone]  Physical Therapy Treatment/re-evaluation  Patient Details  Name: Sarah Garrison MRN: [id number] Date of Birth: [birth year] Referring Provider (PT): Aundra Dubin, [place]   Encounter Date: 03/09/2020   PT End of Session - 03/09/20 1423    Visit Number 14    Number of Visits 20    Date for PT Re-Evaluation 04/27/20    Authorization Type MED PAY ASSURANCE; [employer] [employer] Time Period 3 visits from 8/10-9/14/21, 12 visits from 1/0/31-59/4/58; physician re-cert until 59/29/24    Authorization - Visit Number 7    Authorization - Number of Visits 12    PT Start Time 1419    PT Stop Time 1457    PT Time Calculation (min) 38 min    Activity Tolerance Patient tolerated treatment well    Behavior During Therapy [facility] for tasks assessed/performed           Past Medical History:  Diagnosis Date  . Asthma   . COVID-19   . Diabetes mellitus without complication ([place])   . GERD (gastroesophageal reflux disease)   . Hypertension   . Seizures ([place])     Past Surgical History:  Procedure Laterality Date  . CESAREAN SECTION    . HERNIA REPAIR    . TUBAL LIGATION    . VENTRAL HERNIA REPAIR N/A 11/27/2017   Procedure: [place];  Surgeon: Erroll Luna, MD;  Location: [facility];  Service: General;  Laterality: N/A;  . WISDOM TOOTH EXTRACTION      There were no vitals filed for this visit.   Subjective Assessment - 03/09/20 1421    Subjective I bought a massager and my shoulder feels better. Just a little pain on the right shoulder. Hip is doing well. When it rains, my hip hurts.    Diagnostic tests Hips 09/05/19:FINDINGS:Frontal view of the pelvis as well as frontal and frogleg lateral views of the right hip are obtained. No acute displaced fracture.Mild symmetrical bilateral  hip joint space narrowing compatible with osteoarthritis. Soft tissues are unremarkable.    Patient Stated Goals For my pain to get better    Currently in Pain? Yes    Pain Score 4     Pain Location Shoulder    Pain Orientation Right    Pain Descriptors / Indicators Aching    Aggravating Factors  reaching overhead    Pain Relieving Factors massager    Pain Location Hip    Pain Orientation Right    Aggravating Factors  step hard when walking    Pain Relieving Factors rest after about 5 min of walking.              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      Cognition   Overall Cognitive Status Within Functional Limits for tasks assessed      Observation/Other Assessments   Focus on Therapeutic Outcomes (FOTO)  n/a mcd      Sensation   Additional Comments WFL      Posture/Postural Control   Posture Comments rounded shoulders, elevation on Rt side      AROM   AROM Assessment Site Cervical    Right/Left Shoulder Left    Right Shoulder Flexion 108 Degrees    Right Shoulder ABduction 80 Degrees    Left Shoulder Flexion 110 Degrees    Left Shoulder ABduction 104 Degrees    Cervical Flexion 30    Cervical Extension 24    Cervical - Right Side Bend 30    Cervical - Left Side Bend 22    Cervical - Right Rotation 60    Cervical - Left Rotation 80      Strength   Overall Strength Comments GHJ: flexion 4/5, abd 4/5, ER 5/5- all  measured below 90 deg      Palpation   Palpation comment TTP Rt upper trap, Rt greater trochanter      Ambulation/Gait   Gait Comments lateral to medial foot strike                         [facility] Adult PT Treatment/Exercise - 03/09/20 0001      Exercises   Exercises Knee/Hip      Knee/Hip Exercises: Standing   Gait Training heel-toe at heel strike      Manual Therapy   Manual Therapy Soft tissue mobilization    Soft tissue mobilization Rt upper trap                  PT Education - 03/09/20 1510    Education Details goals, POC, postural alignment, importance of HEP- slow moving today due to having 1 mo old grandbaby with her.    Person(s) Educated Patient    Methods Explanation    Comprehension Verbalized understanding;Need further instruction            PT Short Term Goals - 02/10/20 1018      PT SHORT TERM GOAL #1   Title Pt will be ind in an initail HEP    Time 3    Period Weeks    Status Achieved      PT SHORT TERM GOAL #2   Title Ptwill voice understanding/return demonstration of measure to reduce and manage R hip pain    Time 3    Period Weeks    Status On-going      PT SHORT TERM GOAL #3   Title Establish LTGs following the initial 4 visits of PT care    Time 3    Period Weeks    Status Achieved      PT SHORT TERM GOAL #4   Title Pt will be independent with self correcting her forward head and rounded shoulder posture    Baseline Needs cueing    Time 3    Period Weeks    Status On-going             PT Long Term Goals - 03/09/20 1431      PT LONG TERM GOAL #1   Title pt will be independent with advanced HEP    Baseline achieved for cervical,  needs further progression for hip/LE    Status Partially Met      PT LONG TERM GOAL #2   Title Pt will have full pain free shoulder ROM    Baseline see objective measures, pain with all reported today    Status On-going      PT LONG TERM GOAL #3   Title Pt will be able to  lift 10 lbs overhead for home tasks    Baseline unable to lift her arm overhead unweighted, reports she is unable to lift    Status On-going      PT LONG TERM GOAL #4   Title Pt will be able to stand comfortably for 30 minutes to be able to perform household chores    Baseline still about 15-20 min but only about 5 min when it is hot    Status On-going      PT LONG TERM GOAL #5   Title Pt will be able to amb around the grocery store without discomfort    Baseline I don't go to the grocery store, unable to run errands as needed bc of hip pain and SOB.    Status On-going                 Plan - 03/09/20 1454    Clinical Impression Statement Pt has reached the end of her current MCD auth period and requests to continue treatment of her shoulder and hip. Left shoulder was doing much better today after removing sling and being treated at her last visit. Continues to demo hiking of Rt upper trap creating increased tension but has improved. Encouraged her to continue with HEP. Noted decrease in cervical rotation that rely on upper trap flexibility. Gait presents with lateral to medial strike rather than heel-toe creating abnormal pressures through LE. Will continue to benefit from skilled PT to address stated deficits and reach long term functional goals.    Personal Factors and Comorbidities Time since onset of injury/illness/exacerbation;Comorbidity 3+    Comorbidities Hx of Covid-19, DM, asthma    Examination-Activity Limitations Bathing;Bend;Caring for Others;Carry;Dressing;Lift;Toileting;Stand;Stairs;Squat;Sleep;Sit;Reach Overhead;Locomotion Level;Transfers    PT Frequency 1x / week    PT Duration 6 weeks    PT Treatment/Interventions ADLs/Self Care Home Management;Cryotherapy;Electrical Stimulation;Ultrasound;Traction;Moist Heat;Iontophoresis 22m/ml Dexamethasone;Gait training;Stair training;Functional mobility training;Therapeutic activities;Therapeutic exercise;Manual  techniques;Patient/family education;Passive range of motion;Dry needling;Taping;Vasopneumatic Device    PT Next Visit Plan gastroc stretching, review heel-toe    PT Home Exercise Plan Access Code: [email or web address] 3 way hip with green tband, quad stretch    Consulted and Agree with Plan of Care Patient           Patient will benefit from skilled therapeutic intervention in order to improve the following deficits and impairments:  Abnormal gait, Difficulty walking, Decreased range of motion, Obesity, Decreased activity tolerance, Pain, Impaired flexibility, Decreased mobility, Decreased strength, Postural dysfunction  Visit Diagnosis: Chronic right shoulder pain - Plan: PT plan of care cert/re-cert  Chronic hip pain, right - Plan: PT plan of care cert/re-cert  Other abnormalities of gait and mobility - Plan: PT plan of care cert/re-cert  Limited joint range of motion (ROM) - Plan: PT plan of care cert/re-cert     Problem List Patient Active Problem List   Diagnosis Date Noted  . Hip pain 12/08/2019  . Hyperlipidemia associated with type 2 diabetes mellitus ([place] 11/23/2019  . History of COVID-19 10/27/2019  . Stuttering 10/27/2019  . Depression with anxiety 10/27/2019  . Obesity, Class III, BMI 40-49.9 (morbid  obesity) ([place]) 06/30/2019  . OSA (obstructive sleep apnea) 02/16/2019  . Essential hypertension 06/22/2018  . Uncontrolled type 2 diabetes mellitus with complication, with long-term current use of insulin ([place]) 12/13/2016  . Asthma 07/30/2016  . 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/07/2016  . DM neuropathy, type II diabetes mellitus ([place]) 08/24/2015    Katia Hannen C. Glenda Spelman PT, DPT 03/09/20 3:14 PM   [facility] [place], [place], [address] Phone: [phone]   Fax:  [phone]  Name: Sarah Garrison MRN: [id number] Date of Birth: 12-Jan-1981  Check all possible CPT codes:      _0  97110 (Therapeutic Exercise)  _1   92507 (SLP Treatment)  _2  02111 (Neuro Re-ed)   _3  92526 (Swallowing Treatment)   _4  [phone] (Gait Training)   _5  [phone] (Cognitive Training, 1st 15 minutes) _6  97140 (Manual Therapy)   _7  97130 (Cognitive Training, each add'l 15 minutes)  _8  97530 (Therapeutic Activities)  _9  Other, List CPT Code ____________    _10  30131 (Self Care)       _11  All codes above (97110 - 97535)  _12  97012 (Mechanical Traction)  _13  97014 (E-stim Unattended)  _14  97032 (E-stim manual)  _15  97033 (Ionto)  _16  97035 (Ultrasound)  _17  97016 (Vaso)  _18  97760 (Orthotic Fit) _19  [id number] (Prosthetic Training) _20  [id number] (Physical Performance Training) _21  [id number] (Aquatic Therapy) _22  [id number] (Canalith Repositioning) _23  43888 (Contrast Bath) _24  [id number] (Paraffin) _25  97597 (Wound Care 1st 20 sq cm) _26  97598 (Wound Care each add'l 20 sq cm)

## 2020-03-17 ENCOUNTER — Other Ambulatory Visit: Payer: Self-pay

## 2020-03-17 ENCOUNTER — Ambulatory Visit: Payer: Medicaid Other | Admitting: Physical Therapy

## 2020-03-17 ENCOUNTER — Encounter: Payer: Self-pay | Admitting: Physical Therapy

## 2020-03-17 DIAGNOSIS — M25511 Pain in right shoulder: Secondary | ICD-10-CM | POA: Diagnosis not present

## 2020-03-17 DIAGNOSIS — R2689 Other abnormalities of gait and mobility: Secondary | ICD-10-CM

## 2020-03-17 DIAGNOSIS — G8929 Other chronic pain: Secondary | ICD-10-CM

## 2020-03-17 NOTE — Therapy (Signed)
[place], [place], [address] Phone: [phone]   Fax:  [phone]  Physical Therapy Treatment  Patient Details  Name: Sarah Garrison MRN: [id number] Date of Birth: 1981/04/26 Referring Provider (PT): Aundra Dubin, [place]   Encounter Date: 03/17/2020   PT End of Session - 03/17/20 1028    Visit Number 15    Number of Visits 20    Date for PT Re-Evaluation 04/27/20    Authorization Type MED PAY ASSURANCE; [employer] [place] Time Period 3 visits from 8/10-9/14/21, 12 visits from 6/0/15-61/5/37; physician re-cert until 94/32/76    Authorization - Visit Number --   requested more visits   PT Start Time 1015    PT Stop Time 1105    PT Time Calculation (min) 50 min           Past Medical History:  Diagnosis Date  . Asthma   . COVID-19   . Diabetes mellitus without complication ([place])   . GERD (gastroesophageal reflux disease)   . Hypertension   . Seizures ([place])     Past Surgical History:  Procedure Laterality Date  . CESAREAN SECTION    . HERNIA REPAIR    . TUBAL LIGATION    . VENTRAL HERNIA REPAIR N/A 11/27/2017   Procedure: [place];  Surgeon: Erroll Luna, MD;  Location: [facility];  Service: General;  Laterality: N/A;  . WISDOM TOOTH EXTRACTION      There were no vitals filed for this visit.   Subjective Assessment - 03/17/20 1024    Subjective My right hip and knee is 7/10 pain.    Currently in Pain? Yes    Pain Score 7     Pain Location Hip    Pain Orientation Right    Pain Descriptors / Indicators Aching    Aggravating Factors  weather change and lingers afterward    Pain Relieving Factors heat , stretches                             [facility] Adult PT Treatment/Exercise - 03/17/20 0001      Knee/Hip Exercises: Stretches   Passive Hamstring Stretch Right;20 seconds    Passive Hamstring  Stretch Limitations x 3       Knee/Hip Exercises: Aerobic   Nustep L4 x 8 min      Knee/Hip Exercises: Supine   Bridges 10 reps    Bridges Limitations small ROM due to pain     Bridges with [employer] 10 reps   small rom   Benson with Clamshell 10 reps   small rom   Other Supine Knee/Hip Exercises Gluteal sets, clam with green band , ball squeeze x 10       Knee/Hip Exercises: Sidelying   Clams x 10 -painful       Modalities   Modalities Cryotherapy      Cryotherapy   Number Minutes Cryotherapy 10 Minutes    Cryotherapy Location Hip    Type of Cryotherapy Ice pack      Manual Therapy   Manual therapy comments LAD right hip unabl to tolerate, PROM knee to chest hip IR/ER gently able to tolerate with progressive increase in ROM     Soft tissue mobilization unable to tolerate light STW to right lateral hip  PT Short Term Goals - 02/10/20 1018      PT SHORT TERM GOAL #1   Title Pt will be ind in an initail HEP    Time 3    Period Weeks    Status Achieved      PT SHORT TERM GOAL #2   Title Ptwill voice understanding/return demonstration of measure to reduce and manage R hip pain    Time 3    Period Weeks    Status On-going      PT SHORT TERM GOAL #3   Title Establish LTGs following the initial 4 visits of PT care    Time 3    Period Weeks    Status Achieved      PT SHORT TERM GOAL #4   Title Pt will be independent with self correcting her forward head and rounded shoulder posture    Baseline Needs cueing    Time 3    Period Weeks    Status On-going             PT Long Term Goals - 03/09/20 1431      PT LONG TERM GOAL #1   Title pt will be independent with advanced HEP    Baseline achieved for cervical, needs further progression for hip/LE    Status Partially Met      PT LONG TERM GOAL #2   Title Pt will have full pain free shoulder ROM    Baseline see objective measures, pain with all reported today    Status On-going        PT LONG TERM GOAL #3   Title Pt will be able to lift 10 lbs overhead for home tasks    Baseline unable to lift her arm overhead unweighted, reports she is unable to lift    Status On-going      PT LONG TERM GOAL #4   Title Pt will be able to stand comfortably for 30 minutes to be able to perform household chores    Baseline still about 15-20 min but only about 5 min when it is hot    Status On-going      PT LONG TERM GOAL #5   Title Pt will be able to amb around the grocery store without discomfort    Baseline I don't go to the grocery store, unable to run errands as needed bc of hip pain and SOB.    Status On-going                 Plan - 03/17/20 1113    Clinical Impression Statement Pt arrives with hip pain today. She is antalgic with gait and reports the pain started when it was raining and has not resolved with better weather. She did not toerate LAD of gentle STW to lateral hip. Able to tolerate progressive PROM for right knee to chest ER, and hamstring stretches. IR stretch painful beyone neutral. Ice pack post session to decreaese right lateral hip pain.    PT Next Visit Plan gastroc stretching, review heel-toe    PT Home Exercise Plan Access Code: [email or web address] 3 way hip with green tband, quad stretch           Patient will benefit from skilled therapeutic intervention in order to improve the following deficits and impairments:  Abnormal gait, Difficulty walking, Decreased range of motion, Obesity, Decreased activity tolerance, Pain, Impaired flexibility, Decreased mobility, Decreased strength, Postural dysfunction  Visit Diagnosis: Chronic right shoulder pain  Chronic hip pain,  right  Other abnormalities of gait and mobility     Problem List Patient Active Problem List   Diagnosis Date Noted  . Hip pain 12/08/2019  . Hyperlipidemia associated with type 2 diabetes mellitus ([facility]) 11/23/2019  . History of COVID-19 10/27/2019  . Stuttering 10/27/2019  .  Depression with anxiety 10/27/2019  . Obesity, Class III, BMI 40-49.9 (morbid obesity) ([place]) 06/30/2019  . OSA (obstructive sleep apnea) 02/16/2019  . Essential hypertension 06/22/2018  . Uncontrolled type 2 diabetes mellitus with complication, with long-term current use of insulin ([place]) 12/13/2016  . Asthma 07/30/2016  . 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/07/2016  . DM neuropathy, type II diabetes mellitus ([place]) 08/24/2015    Dorene Ar, PTA 03/17/2020, 11:18 AM  [facility] [address] [place], [place], [address] Phone: [phone]   Fax:  [phone]  Name: Sarah Garrison MRN: [id number] Date of Birth: 1981/01/23

## 2020-03-19 ENCOUNTER — Other Ambulatory Visit (HOSPITAL_COMMUNITY)
Admission: RE | Admit: 2020-03-19 | Discharge: 2020-03-19 | Disposition: A | Discharge status: Home / Self Care | Payer: Medicaid Other | Source: Ambulatory Visit | Attending: Emergency Medicine | Admitting: Emergency Medicine

## 2020-03-19 DIAGNOSIS — Z01818 Encounter for other preprocedural examination: Secondary | ICD-10-CM | POA: Diagnosis not present

## 2020-03-19 DIAGNOSIS — Z20822 Contact with and (suspected) exposure to covid-19: Secondary | ICD-10-CM | POA: Insufficient documentation

## 2020-03-19 LAB — SARS CORONAVIRUS 2 (TAT 6-24 HRS): SARS Coronavirus 2: NEGATIVE

## 2020-03-22 ENCOUNTER — Encounter: Payer: Self-pay | Admitting: Family Medicine

## 2020-03-22 ENCOUNTER — Ambulatory Visit: Payer: Medicaid Other | Attending: Family Medicine | Admitting: Family Medicine

## 2020-03-22 ENCOUNTER — Encounter: Payer: Medicaid Other | Admitting: Physical Therapy

## 2020-03-22 ENCOUNTER — Other Ambulatory Visit: Payer: Self-pay | Admitting: Family Medicine

## 2020-03-22 ENCOUNTER — Other Ambulatory Visit: Payer: Self-pay

## 2020-03-22 DIAGNOSIS — F8081 Childhood onset fluency disorder: Secondary | ICD-10-CM

## 2020-03-22 DIAGNOSIS — J018 Other acute sinusitis: Secondary | ICD-10-CM | POA: Diagnosis not present

## 2020-03-22 DIAGNOSIS — K219 Gastro-esophageal reflux disease without esophagitis: Secondary | ICD-10-CM | POA: Diagnosis not present

## 2020-03-22 DIAGNOSIS — F4323 Adjustment disorder with mixed anxiety and depressed mood: Secondary | ICD-10-CM

## 2020-03-22 MED ORDER — FLUCONAZOLE 150 MG PO TABS: 150.0000 mg | ORAL_TABLET | Freq: Once | ORAL | 0 refills | Status: DC

## 2020-03-22 MED ORDER — AMOXICILLIN 500 MG PO CAPS: 500.0000 mg | ORAL_CAPSULE | Freq: Three times a day (TID) | ORAL | 0 refills | Status: DC

## 2020-03-22 MED ORDER — FLUTICASONE PROPIONATE 50 MCG/ACT NA SUSP: 2.0000 | Freq: Every day | NASAL | 1 refills | Status: DC

## 2020-03-22 MED ORDER — PANTOPRAZOLE SODIUM 40 MG PO TBEC: 40.0000 mg | DELAYED_RELEASE_TABLET | Freq: Every day | ORAL | 3 refills | Status: DC

## 2020-03-22 MED ORDER — SERTRALINE HCL 50 MG PO TABS: 50.0000 mg | ORAL_TABLET | Freq: Every day | ORAL | 3 refills | Status: DC

## 2020-03-22 MED FILL — SERTRALINE HCL 50 MG TABLET: 50 | 30 days supply | Qty: 30 | Fill #0

## 2020-03-22 MED FILL — PANTOPRAZOLE SOD DR 40 MG T: 40 | 30 days supply | Qty: 30 | Fill #0

## 2020-03-22 MED FILL — FLUTICASONE PROP 50 MCG SPR: 50 | 30 days supply | Qty: 16 | Fill #0

## 2020-03-22 MED FILL — FLUCONAZOLE 150 MG TABLET: 150 | 1 days supply | Qty: 1 | Fill #0

## 2020-03-22 MED FILL — AMOXICILLIN 500 MG CAPSULE: 500 | 10 days supply | Qty: 30 | Fill #0

## 2020-03-22 NOTE — Progress Notes (Signed)
Virtual Visit via Video Note  I connected with Sarah Garrison, on 03/22/2020 at 10:02 AM by video enabled telemedicine device due to the COVID-19 pandemic and verified that I am speaking with the correct person using two identifiers.   Consent: I discussed the limitations, risks, security and privacy concerns of performing an evaluation and management service by telemedicine and the availability of in person appointments. I also discussed with the patient that there may be a patient responsible charge related to this service. The patient expressed understanding and agreed to proceed.   Location of Patient: Home  Location of Provider: Clinic   Persons participating in Telemedicine visit: Sagrario Lineberry [place]-CMA Dr. Alvis Lemmings     History of Present Illness: Sarah Garrison is a 39 year old female with a history of type 2 diabetes mellitus (A1c 12.2-followed by endocrinology, Dr. Lucianne Muss), obesity, history of Covid in 09/2019 seen for follow-up visit today.   Her L ear and throat hurt and symptoms have been on for the last 2 days. Had clogged up nostrils as well which was relieved with OTC sudafed. Denies fever, myalgia but does have dysphagia and a sore throat.  Has been using Chloraseptic spray for her throat symptoms.  Has an appointment with Pulmonary tomorrow as she has been stuttering and has had dyspnea since she had COVID-19.  Also undergoing speech therapy for symptoms. She requests a refill of Zoloft which she takes for anxiety and also Protonix for reflux Past Medical History:  Diagnosis Date  . Asthma   . COVID-19   . Diabetes mellitus without complication ([facility])   . GERD (gastroesophageal reflux disease)   . Hypertension   . Seizures ([facility])    Allergies  Allergen Reactions  . Morphine And Related Shortness Of Breath  . Glyburide Diarrhea and Other (See Comments)    Reaction:  Nose bleeds   . Ivp Dye [Iodinated Diagnostic Agents] Other (See Comments)     Shortness of breath.    . Oxycodone Other (See Comments)    Pt states that this medication makes her "dream about rabbits chasing" her.    . Vicodin [Hydrocodone-Acetaminophen] Other (See Comments)    Pt states that this medication makes her "dream about rabbits chasing" her.      Current Outpatient Medications on File Prior to Visit  Medication Sig Dispense Refill  . acetaminophen (TYLENOL) 500 MG tablet Take 1 tablet (500 mg total) by mouth every 6 (six) hours as needed. 30 tablet 0  . albuterol (VENTOLIN HFA) 108 (90 Base) MCG/ACT inhaler Inhale 1-2 puffs into the lungs every 6 (six) hours as needed for wheezing or shortness of breath. 18 g 1  . albuterol (VENTOLIN HFA) 108 (90 Base) MCG/ACT inhaler Inhale 2 puffs into the lungs every 4 (four) hours as needed. 18 g 5  . ARIPiprazole (ABILIFY) 5 MG tablet Take 1 tablet (5 mg total) by mouth daily. 30 tablet 2  . atorvastatin (LIPITOR) 20 MG tablet Take 1 tablet (20 mg total) by mouth daily. 90 tablet 3  . Continuous Blood Gluc Sensor (DEXCOM G6 SENSOR) MISC SMARTSIG:1 Topical Every 10 Days    . Continuous Blood Gluc Transmit (DEXCOM G6 TRANSMITTER) MISC USE TO CHECK BLOOD SUGAR EVERY 3 MONTHS    . cyclobenzaprine (FLEXERIL) 5 MG tablet Take 1-2 tablets (5-10 mg total) by mouth 2 (two) times daily as needed for muscle spasms. 24 tablet 0  . diclofenac (VOLTAREN) 50 MG EC tablet Take 1 tablet (50 mg total) by  mouth 2 (two) times daily. 20 tablet 0  . fluticasone-salmeterol (ADVAIR HFA) 115-21 MCG/ACT inhaler Inhale 2 puffs into the lungs 2 (two) times daily. 1 Inhaler 12  . insulin aspart (NOVOLOG) 100 UNIT/ML injection USE VIA INSULIN PUMP. MAX TOTAL DAILY DOSE OF 200 UNITS    . insulin degludec (TRESIBA) 100 UNIT/ML FlexTouch Pen Inject into the skin.    . Insulin Disposable Pump (OMNIPOD DASH 5 PACK PODS) MISC Take by mouth every other day.    . Insulin Human (INSULIN PUMP) SOLN Inject 1 each into the skin See admin instructions.  Medication: Novolog 100 units/ml injection. Takes 100 units via pump per patient.    . Lactulose 20 GM/30ML SOLN Take 30 mLs (20 g total) by mouth at bedtime. For 3 nights then as needed for constipation 450 mL 0  . losartan (COZAAR) 50 MG tablet Take 1 tablet (50 mg total) by mouth daily. 90 tablet 3  . Misc. Devices MISC CPAP therapy on autopap 5-15.  Needs Small size Fisher[employer]Paykel Full Face Mask Simplus  mask and heated humidification. 1 each 0  . nystatin (MYCOSTATIN) 100000 UNIT/ML suspension Take 5 mLs by mouth 4 (four) times daily.    . pantoprazole (PROTONIX) 40 MG tablet Take 1 tablet (40 mg total) by mouth daily. 15 tablet 0  . sertraline (ZOLOFT) 50 MG tablet Take 1 tablet (50 mg total) by mouth daily. 30 tablet 3  . TRUEPLUS PEN NEEDLES 32G X 4 MM MISC USE 3 TIMES DAILY AS DIRECTED 100 each 5  . VITAMIN D PO Take 1 tablet by mouth 2 (two) times daily.    . Cetirizine HCl 10 MG CAPS Take 1 capsule (10 mg total) by mouth daily. 30 capsule 0   No current facility-administered medications on file prior to visit.    Observations/Objective: Awake, alert, oriented x3 Stuttering during encounter but not noticed to be in obvious distress. On visual exam throat appears normal with no hyperemia or exudates Palpation of maxillary, frontal and ethmoidal sinuses by patient reveals no tenderness to palpation or percussion  Lab Results  Component Value Date   HGBA1C 12.2 (H) 10/07/2019    Assessment and Plan: 1. Gastroesophageal reflux disease, unspecified whether esophagitis present Uncontrolled due to running out of medication which I have refilled - pantoprazole (PROTONIX) 40 MG tablet; Take 1 tablet (40 mg total) by mouth daily.  Dispense: 30 tablet; Refill: 3  2. Acute non-recurrent sinusitis of other sinus Given underlying history of uncontrolled diabetes mellitus we will proceed with treating with antibiotics Advised to use nasal irrigation - amoxicillin (AMOXIL) 500 MG capsule;  Take 1 capsule (500 mg total) by mouth 3 (three) times daily.  Dispense: 30 capsule; Refill: 0 - fluticasone (FLONASE) 50 MCG/ACT nasal spray; Place 2 sprays into both nostrils daily.  Dispense: 16 g; Refill: 1  3. Stuttering Symptoms have been present since the COVID-19 infection 6 months ago Undergoing speech therapy Scheduled to see pulmonary tomorrow due to ongoing dyspnea and stuttering  4. Adjustment disorder with mixed anxiety and depressed mood Stable - sertraline (ZOLOFT) 50 MG tablet; Take 1 tablet (50 mg total) by mouth daily.  Dispense: 30 tablet; Refill: 3   Follow Up Instructions: 3 months for chronic disease management   I discussed the assessment and treatment plan with the patient. The patient was provided an opportunity to ask questions and all were answered. The patient agreed with the plan and demonstrated an understanding of the instructions.   The patient was  advised to call back or seek an in-person evaluation if the symptoms worsen or if the condition fails to improve as anticipated.     I provided 18 minutes total of Telehealth time during this encounter including median intraservice time, reviewing previous notes, investigations, ordering medications, medical decision making, coordinating care and patient verbalized understanding at the end of the visit.     Hoy Register, MD, FAAFP. [facility] [place], [place] [phone]   03/22/2020, 10:02 AM

## 2020-03-22 NOTE — Progress Notes (Signed)
States that she has a ear ache.(left ear)  Throat is hurting on left side.  Took otc medication for sinus and she is much better.

## 2020-03-23 ENCOUNTER — Encounter: Payer: Self-pay | Admitting: Emergency Medicine

## 2020-03-23 ENCOUNTER — Other Ambulatory Visit: Payer: Self-pay

## 2020-03-23 ENCOUNTER — Ambulatory Visit (INDEPENDENT_AMBULATORY_CARE_PROVIDER_SITE_OTHER): Payer: Medicaid Other | Admitting: Emergency Medicine

## 2020-03-23 ENCOUNTER — Ambulatory Visit: Payer: Medicaid Other | Admitting: Emergency Medicine

## 2020-03-23 DIAGNOSIS — Z8616 Personal history of COVID-19: Secondary | ICD-10-CM

## 2020-03-23 DIAGNOSIS — F8081 Childhood onset fluency disorder: Secondary | ICD-10-CM | POA: Diagnosis not present

## 2020-03-23 DIAGNOSIS — J4551 Severe persistent asthma with (acute) exacerbation: Secondary | ICD-10-CM | POA: Diagnosis not present

## 2020-03-23 DIAGNOSIS — G4733 Obstructive sleep apnea (adult) (pediatric): Secondary | ICD-10-CM | POA: Diagnosis not present

## 2020-03-23 DIAGNOSIS — J4520 Mild intermittent asthma, uncomplicated: Secondary | ICD-10-CM

## 2020-03-23 LAB — PULMONARY FUNCTION TEST
DL/VA % pred: 143 %
DL/VA: 6.43 ml/min/mmHg/L
DLCO cor % pred: 121 %
DLCO cor: 26.7 ml/min/mmHg
DLCO unc % pred: 121 %
DLCO unc: 26.7 ml/min/mmHg
FEF 25-75 Post: 3.06 L/sec
FEF 25-75 Pre: 2.8 L/sec
FEF2575-%Change-Post: 9 %
FEF2575-%Pred-Post: 105 %
FEF2575-%Pred-Pre: 96 %
FEV1-%Change-Post: 3 %
FEV1-%Pred-Post: 96 %
FEV1-%Pred-Pre: 94 %
FEV1-Post: 2.5 L
FEV1-Pre: 2.43 L
FEV1FVC-%Change-Post: 2 %
FEV1FVC-%Pred-Pre: 101 %
FEV6-%Change-Post: 1 %
FEV6-%Pred-Post: 93 %
FEV6-%Pred-Pre: 92 %
FEV6-Post: 2.86 L
FEV6-Pre: 2.81 L
FEV6FVC-%Change-Post: 0 %
FEV6FVC-%Pred-Post: 101 %
FEV6FVC-%Pred-Pre: 100 %
FVC-%Change-Post: 0 %
FVC-%Pred-Post: 91 %
FVC-%Pred-Pre: 91 %
FVC-Post: 2.86 L
FVC-Pre: 2.84 L
Post FEV1/FVC ratio: 88 %
Post FEV6/FVC ratio: 100 %
Pre FEV1/FVC ratio: 85 %
Pre FEV6/FVC Ratio: 99 %
RV % pred: 89 %
RV: 1.38 L
TLC % pred: 81 %
TLC: 4.14 L

## 2020-03-23 NOTE — Progress Notes (Signed)
Subjective:    Patient ID: Sarah Garrison, female    DOB: Oct 14, 1980, 39 y.o.   MRN: [id number]  HPI 39 year old woman with a minimal tobacco history (0.2 pack years), diabetes, GERD, hypertension, seizure disorder, obstructive sleep apnea (optimal CPAP of 8 cmH2O).  She carries a history of childhood asthma, then active again in her early 68's.  She had COVID-19 pneumonitis diagnosed 09/29/2019, admitted early May and received steroids, Remdesivir, was treated for possible superimposed bacterial PNA. Ever since the hospitalization she has resting and exertional SOB. She also noted onset stuttering speech pattern. Dyspnea is worse when she is supine. No cough or mucous. She feels a chest heaviness, she hears wheeze. Her functional capacity is down - she can walk for 5 minutes and then has to stop. She still hasn't gotten used to CPAP - it makes her more SOB so she has to stop. Started on Advair in June, taken off lisinopril. She feels that the stuttering is improving some with SLP. The stutter is actually intermittently blocking airflow, even when she is not trying to speak.   Her DBP is 102 on intake eval this pm  PSG 12/15/2019 mask desensitization performed CT chest 09/17/2019, apparently performed for trauma, reviewed by me, shows clear lungs without any infiltrates or effusions  ROV 03/23/2020 --follow-up visit for 39 year old woman with diabetes, GERD, hypertension, seizure disorder. She is on CPAP for obstructive sleep apnea. She carries a history of childhood asthma. She had COVID-19 pneumonitis in April 2021 and required hospital admission (no intubation). She has been dealing with a stuttering respiratory pattern that sounds like intermittent upper airway obstruction as opposed to impaired phonation. She was referred to speech therapy. The stuttering is improved, but can still happen. She has been referred to [facility] for laryngoscopy, but has been scared to get it done.   She is still  having exertional SOB after a long walk - 5-10 minutes. She notices it when supine as well. She has advair but is only using prn. She doesn't have an albuterol.  She reports that she was using her CPAP - stopped it 2 weeks ago because she needs repairs on it. She is trying to get this done through [employer] - needs help with this. She has a new mask that she prefers.  Pulmonary function testing done today, reviewed by me, show normal airflows without a bronchodilator response and a grossly normal flow volume loop, normal lung volumes, slightly elevated diffusion capacity.   Review of Systems As per HPI      Objective:   Physical Exam  Vitals:   03/23/20 1059  BP: (!) 144/82  Pulse: (!) 110  Temp: (!) 97.1 F (36.2 C)  TempSrc: Temporal  SpO2: 99%  Weight: 244 lb (110.7 kg)  Height: 5\' 4"  (1.626 m)   Gen: Pleasant, obese woman, in no distress, less anxious  ENT: No lesions,  mouth clear,  oropharynx clear, no postnasal drip  Neck: No JVD, no stridor  Lungs: No use of accessory muscles, no stuttering respiratory pattern today.  Decreased at both bases, no crackles or wheezing on normal respiration, no wheeze on forced expiration  Cardiovascular: RRR, heart sounds normal, no murmur or gallops, no peripheral edema  Musculoskeletal: No deformities, no cyanosis or clubbing  Neuro: alert, awake, non focal  Skin: Warm, no lesions or rash    Assessment & Plan:  Asthma Her pulmonary function testing today does not show any evidence for obstructive lung disease or asthma.  If there is any asthma present then it is mild.  Doubt this is significant contributor to her overall dyspnea.  The history is more consistent with intermittent upper airway obstruction especially given the stuttering phenomenon.  The stuttering is better today and she is been working with SLP. Stop Advair (potential upper airway irritant).  Okay for her to keep albuterol available to use as needed.  We will refill this  today.  OSA (obstructive sleep apnea) She was tolerating CPAP until about 2 weeks ago, has been having trouble with her device.  It is apparently not functioning appropriately, needs new filters based on her discussions with her DME.  We will send an order to the DME so that they can clarify, get this repaired.  Stuttering Agree with continued SLP follow-up.  Also agree with their plans to refer her to [facility] for laryngoscopy.  Need to rule out an anatomical abnormality post Covid.  She was not intubated during that hospitalization.  Levy Pupa, MD, PhD 03/23/2020, 11:19 AM [facility] [phone] or if no answer [phone]

## 2020-03-23 NOTE — Assessment & Plan Note (Signed)
Agree with continued SLP follow-up.  Also agree with their plans to refer her to [facility] for laryngoscopy.  Need to rule out an anatomical abnormality post Covid.  She was not intubated during that hospitalization.

## 2020-03-23 NOTE — Addendum Note (Signed)
Addended by: Dorisann Frames R on: 03/23/2020 11:22 AM   Modules accepted: Orders

## 2020-03-23 NOTE — Progress Notes (Signed)
Full PFT performed today. °

## 2020-03-23 NOTE — Assessment & Plan Note (Signed)
Her pulmonary function testing today does not show any evidence for obstructive lung disease or asthma.  If there is any asthma present then it is mild.  Doubt this is significant contributor to her overall dyspnea.  The history is more consistent with intermittent upper airway obstruction especially given the stuttering phenomenon.  The stuttering is better today and she is been working with SLP. Stop Advair (potential upper airway irritant).  Okay for her to keep albuterol available to use as needed.  We will refill this today.

## 2020-03-23 NOTE — Patient Instructions (Signed)
No evidence for asthma on your pulmonary function testing today.  This is good news. Stop your Advair. We will refill albuterol.  You can keep it available to use if you need it for episodes of shortness of breath. Agree that you need to get back on your CPAP machine.  We will send an order to [employer] to help get you the equipment repairs that you need. Continue to work with speech therapy.  I agree with their plan to refer you to ENT so that you can have an inspection of your vocal cords and upper airway.  Get this done if you are willing to do so. Work on your exercise, conditioning and slow steady weight loss Follow with Dr Delton Coombes in 6 months or sooner if you have any problems

## 2020-03-23 NOTE — Assessment & Plan Note (Signed)
She was tolerating CPAP until about 2 weeks ago, has been having trouble with her device.  It is apparently not functioning appropriately, needs new filters based on her discussions with her DME.  We will send an order to the DME so that they can clarify, get this repaired.

## 2020-03-24 ENCOUNTER — Ambulatory Visit: Payer: Medicaid Other | Admitting: Physical Therapy

## 2020-03-24 ENCOUNTER — Encounter: Payer: Self-pay | Admitting: Physical Therapy

## 2020-03-24 DIAGNOSIS — M25511 Pain in right shoulder: Secondary | ICD-10-CM | POA: Diagnosis not present

## 2020-03-24 DIAGNOSIS — G8929 Other chronic pain: Secondary | ICD-10-CM

## 2020-03-24 DIAGNOSIS — R2689 Other abnormalities of gait and mobility: Secondary | ICD-10-CM

## 2020-03-24 MED FILL — NovoLOG 100 UNIT/ML SOLN: 100 | 30 days supply | Qty: 60 | Fill #2

## 2020-03-24 NOTE — Therapy (Signed)
[place], [place], [address] Phone: [phone]   Fax:  [phone]  Physical Therapy Treatment  Patient Details  Name: Sarah Garrison MRN: [id number] Date of Birth: 05-11-1981 Referring Provider (PT): Aundra Dubin, [place]   Encounter Date: 03/24/2020   PT End of Session - 03/24/20 1022    Visit Number 16    Number of Visits 20    Date for PT Re-Evaluation 04/27/20    Authorization Type MED PAY ASSURANCE; [place] Time Period 3 visits from 8/10-9/14/21, 12 visits from 08/08/88-24/0/97; physician re-cert until 35/32/99    PT Start Time 1016    PT Stop Time 1058    PT Time Calculation (min) 42 min           Past Medical History:  Diagnosis Date  . Asthma   . COVID-19   . Diabetes mellitus without complication ([place])   . GERD (gastroesophageal reflux disease)   . Hypertension   . Seizures ([place])     Past Surgical History:  Procedure Laterality Date  . CESAREAN SECTION    . HERNIA REPAIR    . TUBAL LIGATION    . VENTRAL HERNIA REPAIR N/A 11/27/2017   Procedure: [place];  Surgeon: Erroll Luna, MD;  Location: [facility];  Service: General;  Laterality: N/A;  . WISDOM TOOTH EXTRACTION      There were no vitals filed for this visit.   Subjective Assessment - 03/24/20 1019    Subjective Right shoulder is hurting a lttle. My right hip is feeling better. The knee still hurts if I step down hard on it.    Currently in Pain? Yes    Pain Score 6     Pain Location Shoulder    Pain Orientation Right    Pain Descriptors / Indicators Aching;Tightness    Pain Type Acute pain    Aggravating Factors  lifting reaching out    Pain Relieving Factors heat stretches              [facility] PT Assessment - 03/24/20 0001      AROM   Right Shoulder Flexion 115 Degrees    Right Shoulder ABduction 90 Degrees    Right Shoulder  Internal Rotation --   reach to sacrum   Right Shoulder External Rotation --   reach to T2                        [facility] Adult PT Treatment/Exercise - 03/24/20 0001      Shoulder Exercises: Supine   Horizontal ABduction 20 reps    Theraband Level (Shoulder Horizontal ABduction) Level 2 (Red)    External Rotation 20 reps    Theraband Level (Shoulder External Rotation) Level 2 (Red)    Other Supine Exercises lower trap activation supine press x 10     Other Supine Exercises narrow grip red pullovers x 10       Shoulder Exercises: Standing   Row Strengthening;Both;Theraband;20 reps    Theraband Level (Shoulder Row) Level 3 (Green)      Shoulder Exercises: ROM/Strengthening   UBE (Upper Arm Bike) L1 1 minute forward and backward     Other ROM/Strengthening Exercises wall ladder shoulder flexion and abduction x 10 each       Manual Therapy   Manual therapy comments PROM soulder 4 ways- limited tolerance and guarding with flexion and  abduction                    PT Short Term Goals - 03/24/20 1020      PT SHORT TERM GOAL #1   Title Pt will be ind in an initail HEP    Time 3    Period Weeks    Status Achieved    Target Date 01/22/20      PT SHORT TERM GOAL #2   Title Ptwill voice understanding/return demonstration of measure to reduce and manage R hip pain    Baseline use heat and ice, use massage pillow    Time 3    Period Weeks    Status Achieved      PT SHORT TERM GOAL #3   Title Establish LTGs following the initial 4 visits of PT care    Time 3    Period Weeks    Status Achieved      PT SHORT TERM GOAL #4   Title Pt will be independent with self correcting her forward head and rounded shoulder posture    Baseline Needs cueing    Time 3    Period Weeks    Status On-going    Target Date 02/02/20             PT Long Term Goals - 03/09/20 1431      PT LONG TERM GOAL #1   Title pt will be independent with advanced HEP    Baseline  achieved for cervical, needs further progression for hip/LE    Status Partially Met      PT LONG TERM GOAL #2   Title Pt will have full pain free shoulder ROM    Baseline see objective measures, pain with all reported today    Status On-going      PT LONG TERM GOAL #3   Title Pt will be able to lift 10 lbs overhead for home tasks    Baseline unable to lift her arm overhead unweighted, reports she is unable to lift    Status On-going      PT LONG TERM GOAL #4   Title Pt will be able to stand comfortably for 30 minutes to be able to perform household chores    Baseline still about 15-20 min but only about 5 min when it is hot    Status On-going      PT LONG TERM GOAL #5   Title Pt will be able to amb around the grocery store without discomfort    Baseline I don't go to the grocery store, unable to run errands as needed bc of hip pain and SOB.    Status On-going                 Plan - 03/24/20 1103    Clinical Impression Statement Pt arrives reporting back and knee are doing pretty good however right shoulde is painful with reaching up and out. Focused shoulder today with ROM and scapular activation. She c/o popping with overhead movements. She is guarded with PROM of shoulder overhead. She reports compliance with shoulder exercises. The notes that the weather greatly affects her overall body pain. She can verablly indicate ways to manage her hip pain, knee pain and shoulder pain. She has met STG# 2. She still requires cues for upright posture.    PT Next Visit Plan work toward [place] over remaining visits    PT Home Exercise Plan Access Code: [email or web address] 3 way hip with  green tband, quad stretch           Patient will benefit from skilled therapeutic intervention in order to improve the following deficits and impairments:  Abnormal gait, Difficulty walking, Decreased range of motion, Obesity, Decreased activity tolerance, Pain, Impaired flexibility, Decreased mobility, Decreased  strength, Postural dysfunction  Visit Diagnosis: Chronic right shoulder pain  Chronic hip pain, right  Other abnormalities of gait and mobility     Problem List Patient Active Problem List   Diagnosis Date Noted  . Hip pain 12/08/2019  . Hyperlipidemia associated with type 2 diabetes mellitus ([place]) 11/23/2019  . History of COVID-19 10/27/2019  . Stuttering 10/27/2019  . Depression with anxiety 10/27/2019  . Obesity, Class III, BMI 40-49.9 (morbid obesity) ([place]) 06/30/2019  . OSA (obstructive sleep apnea) 02/16/2019  . Essential hypertension 06/22/2018  . Uncontrolled type 2 diabetes mellitus with complication, with long-term current use of insulin ([place]) 12/13/2016  . Asthma 07/30/2016  . 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/07/2016  . DM neuropathy, type II diabetes mellitus ([place]) 08/24/2015    Dorene Ar, PTA 03/24/2020, 11:08 AM  [facility] [address] [place], [place], [address] Phone: [phone]   Fax:  [phone]  Name: ANNALYCIA DONE MRN: [id number] Date of Birth: 1980-07-09

## 2020-03-31 ENCOUNTER — Other Ambulatory Visit: Payer: Self-pay

## 2020-03-31 ENCOUNTER — Ambulatory Visit: Payer: Medicaid Other

## 2020-03-31 DIAGNOSIS — M2560 Stiffness of unspecified joint, not elsewhere classified: Secondary | ICD-10-CM

## 2020-03-31 DIAGNOSIS — M25511 Pain in right shoulder: Secondary | ICD-10-CM | POA: Diagnosis not present

## 2020-03-31 DIAGNOSIS — G8929 Other chronic pain: Secondary | ICD-10-CM

## 2020-03-31 DIAGNOSIS — R2689 Other abnormalities of gait and mobility: Secondary | ICD-10-CM

## 2020-03-31 NOTE — Therapy (Signed)
[place], [place], [address] Phone: [phone]   Fax:  [phone]  Physical Therapy Treatment  Patient Details  Name: Sarah Garrison MRN: [id number] Date of Birth: 07-Jan-1981 Referring Provider (PT): Aundra Dubin, [place]   Encounter Date: 03/31/2020   PT End of Session - 03/31/20 1110    Visit Number 17    Number of Visits 20    Date for PT Re-Evaluation 04/27/20    Authorization Type MED PAY ASSURANCE; [place] [place] Time Period 3 visits from 8/10-9/14/21, 12 visits from 06/06/22-40/1/02; physician re-cert until 72/53/66    PT Start Time 1104    PT Stop Time 1144    PT Time Calculation (min) 40 min    Activity Tolerance Patient tolerated treatment well    Behavior During Therapy [facility] for tasks assessed/performed           Past Medical History:  Diagnosis Date  . Asthma   . COVID-19   . Diabetes mellitus without complication ([place])   . GERD (gastroesophageal reflux disease)   . Hypertension   . Seizures ([place])     Past Surgical History:  Procedure Laterality Date  . CESAREAN SECTION    . HERNIA REPAIR    . TUBAL LIGATION    . VENTRAL HERNIA REPAIR N/A 11/27/2017   Procedure: [place];  Surgeon: Erroll Luna, MD;  Location: [facility];  Service: General;  Laterality: N/A;  . WISDOM TOOTH EXTRACTION      There were no vitals filed for this visit.   Subjective Assessment - 03/31/20 1108    Subjective Her right shoulder is feeling sore, and her knee hurts when she steps on it.    Currently in Pain? Yes    Pain Score 7     Pain Location Shoulder    Pain Orientation Right    Pain Score 7    Pain Location Hip    Pain Orientation Right              [facility] PT Assessment - 03/31/20 0001      AROM   Right Shoulder Flexion 75 Degrees   pt limited today 2 incr pain   Right Shoulder ABduction 77 Degrees   leaning  away from R side   Right Shoulder Internal Rotation --   R glute   Right Shoulder External Rotation --   to C4                        [facility] Adult PT Treatment/Exercise - 03/31/20 0001      Shoulder Exercises: ROM/Strengthening   UBE (Upper Arm Bike) L1 2 minutes forward   unable to tolerate backward                   PT Short Term Goals - 03/24/20 1020      PT SHORT TERM GOAL #1   Title Pt will be ind in an initail HEP    Time 3    Period Weeks    Status Achieved    Target Date 01/22/20      PT SHORT TERM GOAL #2   Title Ptwill voice understanding/return demonstration of measure to reduce and manage R hip pain    Baseline use heat and ice, use massage pillow    Time 3    Period Weeks    Status Achieved  PT SHORT TERM GOAL #3   Title Establish LTGs following the initial 4 visits of PT care    Time 3    Period Weeks    Status Achieved      PT SHORT TERM GOAL #4   Title Pt will be independent with self correcting her forward head and rounded shoulder posture    Baseline Needs cueing    Time 3    Period Weeks    Status On-going    Target Date 02/02/20             PT Long Term Goals - 03/31/20 1112      PT LONG TERM GOAL #1   Title pt will be independent with advanced HEP    Baseline achieved for cervical, needs further progression for hip/LE    Status Partially Met      PT LONG TERM GOAL #2   Title Pt will have full pain free shoulder ROM    Baseline see objective measures, pain with all reported today    Status On-going      PT LONG TERM GOAL #3   Title Pt will be able to lift 10 lbs overhead for home tasks    Baseline unable to lift her arm overhead unweighted, reports she is unable to lift    Status On-going      PT LONG TERM GOAL #4   Title Pt will be able to stand comfortably for 30 minutes to be able to perform household chores    Baseline about 20-30 min    Status On-going      PT LONG TERM GOAL #5   Title Pt will  be able to amb around the grocery store without discomfort    Baseline She goes to the store now, but takes a kid to help her and leans on the cart (gets in the scooter when leg really hurts)    Status On-going                 Plan - 03/31/20 1110    Clinical Impression Statement Pt arrive incredibly sensitive to touch and positions today, reporting pain with nearly every attempt to exercise with shoulder and hip. Unable to stand today or sit, but lie in supine with wedge and perform isometric quad sets, glue sets, ankle pumps, TrA activation (c/o L toe pain, R hip/knee/leg pain), and SAQ.    PT Next Visit Plan work toward [place] over remaining visits    PT Home Exercise Plan Access Code: [email or web address] 3 way hip with green tband, quad stretch           Patient will benefit from skilled therapeutic intervention in order to improve the following deficits and impairments:  Abnormal gait, Difficulty walking, Decreased range of motion, Obesity, Decreased activity tolerance, Pain, Impaired flexibility, Decreased mobility, Decreased strength, Postural dysfunction  Visit Diagnosis: Chronic right shoulder pain  Chronic hip pain, right  Other abnormalities of gait and mobility  Limited joint range of motion (ROM)     Problem List Patient Active Problem List   Diagnosis Date Noted  . Hip pain 12/08/2019  . Hyperlipidemia associated with type 2 diabetes mellitus ([place]) 11/23/2019  . History of COVID-19 10/27/2019  . Stuttering 10/27/2019  . Depression with anxiety 10/27/2019  . Obesity, Class III, BMI 40-49.9 (morbid obesity) ([place]) 06/30/2019  . OSA (obstructive sleep apnea) 02/16/2019  . Essential hypertension 06/22/2018  . Uncontrolled type 2 diabetes mellitus with complication, with long-term current use of  insulin ([place]) 12/13/2016  . Asthma 07/30/2016  . 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/07/2016  . DM neuropathy, type II diabetes mellitus ([place]) 08/24/2015    Izell [place], PT,  DPT 03/31/2020, 11:52 AM  [facility] [address] [place], [place], [address] Phone: [phone]   Fax:  [phone]  Name: Sarah Garrison MRN: [id number] Date of Birth: Oct 03, 1980

## 2020-04-04 ENCOUNTER — Other Ambulatory Visit: Payer: Self-pay

## 2020-04-04 ENCOUNTER — Ambulatory Visit: Payer: Medicaid Other | Attending: Physician Assistant

## 2020-04-04 ENCOUNTER — Ambulatory Visit: Payer: Medicaid Other | Admitting: Physical Therapy

## 2020-04-04 DIAGNOSIS — M2560 Stiffness of unspecified joint, not elsewhere classified: Secondary | ICD-10-CM | POA: Insufficient documentation

## 2020-04-04 DIAGNOSIS — G8929 Other chronic pain: Secondary | ICD-10-CM | POA: Diagnosis present

## 2020-04-04 DIAGNOSIS — R2689 Other abnormalities of gait and mobility: Secondary | ICD-10-CM | POA: Insufficient documentation

## 2020-04-04 DIAGNOSIS — M25511 Pain in right shoulder: Secondary | ICD-10-CM | POA: Diagnosis not present

## 2020-04-04 DIAGNOSIS — M25551 Pain in right hip: Secondary | ICD-10-CM | POA: Diagnosis present

## 2020-04-04 NOTE — Therapy (Signed)
[place], [place], [address] Phone: [phone]   Fax:  [phone]  Physical Therapy Treatment  Patient Details  Name: Sarah Garrison MRN: [id number] Date of Birth: [birth year] Referring Provider (PT): Aundra Dubin, [place]   Encounter Date: 04/04/2020   PT End of Session - 04/04/20 1011    Visit Number 18    Number of Visits 20    Date for PT Re-Evaluation 04/27/20    Authorization Type MED PAY ASSURANCE; [place] Time Period 3 visits from 8/10-9/14/21, 12 visits from 11/10/60-95/2/84; physician re-cert until 13/24/40    PT Start Time 1007   pt arrived late   PT Stop Time 1045    PT Time Calculation (min) 38 min    Activity Tolerance Patient tolerated treatment well    Behavior During Therapy WFL for tasks assessed/performed           Past Medical History:  Diagnosis Date   Asthma    COVID-19    Diabetes mellitus without complication ([place])    GERD (gastroesophageal reflux disease)    Hypertension    Seizures ([place])     Past Surgical History:  Procedure Laterality Date   CESAREAN SECTION     [place] N/A 11/27/2017   Procedure: [place];  Surgeon: Erroll Luna, MD;  Location: [facility];  Service: General;  Laterality: N/A;   WISDOM TOOTH EXTRACTION      There were no vitals filed for this visit.   Subjective Assessment - 04/04/20 1033    Subjective Pt reports her right shoulder feels fine but the right leg hurts a lot.    Currently in Pain? Yes    Pain Score 4     Pain Location Shoulder    Pain Orientation Right    Pain Descriptors / Indicators Aching;Tightness    Pain Type Acute pain    Multiple Pain Sites Yes    Pain Score 7    Pain Location Hip    Pain Orientation Right    Pain Descriptors / Indicators Tightness;Sharp;Shooting    Pain  Radiating Towards R posterior knee and thigh (HS and politeal region)              [facility] PT Assessment - 04/04/20 0001      Assessment   Medical Diagnosis Pain in right hip; Chronic right shoulder pain    Referring Provider (PT) Nathaniel Man                         [facility] Adult PT Treatment/Exercise - 04/04/20 0001      Knee/Hip Exercises: Stretches   Passive Hamstring Stretch Right;30 seconds    Passive Hamstring Stretch Limitations 2x      Knee/Hip Exercises: Aerobic   Nustep L3 x 5 min      Knee/Hip Exercises: Standing   Hip Abduction Stengthening;Right;10 reps    Abduction Limitations BUE at counter as needed    Other Standing Knee Exercises Step ups with 4" step - ascending with RLE and descending with LLE 10x      Knee/Hip Exercises: Seated   Long Arc Quad Strengthening;Right;10 reps    Other Seated Knee/Hip Exercises Seated HS stretch 2 x 30 sec - pt had low tolerance and occasionally placed HS on slack for relief  within the 30 sec      Knee/Hip Exercises: Supine   Bridges 10 reps    Bridges Limitations Decreased ROM - glute and HS activation but did not clear hips off mat due to complaints of pain     Other Supine Knee/Hip Exercises --    Other Supine Knee/Hip Exercises Gluteal sets, clam without resistance, ball squeeze x 10 each; frequent rest breaks       Manual Therapy   Manual therapy comments Attempted R LLE LAD but pt unable to tolerate initiation even due to pain. Pt with significant TTP that did not allow for STM. PT performed light touch over R hip FL and HS repetitively in an effort to desensitize area - pt only able to tolerate for a few minutes. Was able to perform brief passive R HS stretch briefly - limited range due to pt complaints of significant pain.                  PT Education - 04/04/20 1315    Education Details Discussed goals and activities that are difficult for pt at home/work, posture, HEP, and  desensitization of RLE.    Person(s) Educated Patient    Methods Explanation;Demonstration    Comprehension Verbalized understanding;Returned demonstration;Need further instruction;Verbal cues required            PT Short Term Goals - 03/24/20 1020      PT SHORT TERM GOAL #1   Title Pt will be ind in an initail HEP    Time 3    Period Weeks    Status Achieved    Target Date 01/22/20      PT SHORT TERM GOAL #2   Title Ptwill voice understanding/return demonstration of measure to reduce and manage R hip pain    Baseline use heat and ice, use massage pillow    Time 3    Period Weeks    Status Achieved      PT SHORT TERM GOAL #3   Title Establish LTGs following the initial 4 visits of PT care    Time 3    Period Weeks    Status Achieved      PT SHORT TERM GOAL #4   Title Pt will be independent with self correcting her forward head and rounded shoulder posture    Baseline Needs cueing    Time 3    Period Weeks    Status On-going    Target Date 02/02/20             PT Long Term Goals - 03/31/20 1112      PT LONG TERM GOAL #1   Title pt will be independent with advanced HEP    Baseline achieved for cervical, needs further progression for hip/LE    Status Partially Met      PT LONG TERM GOAL #2   Title Pt will have full pain free shoulder ROM    Baseline see objective measures, pain with all reported today    Status On-going      PT LONG TERM GOAL #3   Title Pt will be able to lift 10 lbs overhead for home tasks    Baseline unable to lift her arm overhead unweighted, reports she is unable to lift    Status On-going      PT LONG TERM GOAL #4   Title Pt will be able to stand comfortably for 30 minutes to be able to perform household chores  Baseline about 20-30 min    Status On-going      PT LONG TERM GOAL #5   Title Pt will be able to amb around the grocery store without discomfort    Baseline She goes to the store now, but takes a kid to help her and  leans on the cart (gets in the scooter when leg really hurts)    Status On-going                 Plan - 04/04/20 1316    Clinical Impression Statement Pt presents with continued hypersensitivity and low tolerance to mat exercises, stretches, and NuStep. Pt only able to tolerate brief HS stretch actively and passively with breaks within 30 sec time frame and had complaints of pain, stating "it hurts hurts hurts," during each intervention. Pt had difficulty with step ups but was able to perform when using BUE for assistance. Pt states she has trouble negotiating stairs at work.    Personal Factors and Comorbidities Time since onset of injury/illness/exacerbation;Comorbidity 3+    Comorbidities Hx of Covid-19, DM, asthma    Examination-Activity Limitations Bathing;Bend;Caring for Others;Carry;Dressing;Lift;Toileting;Stand;Stairs;Squat;Sleep;Sit;Reach Overhead;Locomotion Level;Transfers    Rehab Potential Fair    PT Frequency 1x / week    PT Duration 6 weeks    PT Treatment/Interventions ADLs/Self Care Home Management;Cryotherapy;Electrical Stimulation;Ultrasound;Traction;Moist Heat;Iontophoresis 81m/ml Dexamethasone;Gait training;Stair training;Functional mobility training;Therapeutic activities;Therapeutic exercise;Manual techniques;Patient/family education;Passive range of motion;Dry needling;Taping;Vasopneumatic Device    PT Next Visit [place]    PT Home Exercise Plan Access Code: [email or web address] 3 way hip with green tband, quad stretch    Consulted and Agree with Plan of Care Patient           Patient will benefit from skilled therapeutic intervention in order to improve the following deficits and impairments:  Abnormal gait, Difficulty walking, Decreased range of motion, Obesity, Decreased activity tolerance, Pain, Impaired flexibility, Decreased mobility, Decreased strength, Postural dysfunction  Visit Diagnosis: Chronic right shoulder pain  Chronic  hip pain, right  Other abnormalities of gait and mobility     Problem List Patient Active Problem List   Diagnosis Date Noted   Hip pain 12/08/2019   Hyperlipidemia associated with type 2 diabetes mellitus ([place] 11/23/2019   History of COVID-19 10/27/2019   Stuttering 10/27/2019   Depression with anxiety 10/27/2019   Obesity, Class III, BMI 40-49.9 (morbid obesity) ([place] 06/30/2019   OSA (obstructive sleep apnea) 02/16/2019   Essential hypertension 06/22/2018   Uncontrolled type 2 diabetes mellitus with complication, with long-term current use of insulin ([place] 12/13/2016   Asthma 07/30/2016   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/07/2016   DM neuropathy, type II diabetes mellitus ([place] 08/24/2015    KHaydee Monica PT, DPT 04/04/20 1:25 PM  [facility] [address]Phone: [phone]  Fax:  [phone] Name: Sarah WESTERMEYERMRN: [id number] of Birth: 8May 23, 1982

## 2020-04-07 ENCOUNTER — Encounter: Payer: Self-pay | Admitting: Sports Medicine

## 2020-04-07 ENCOUNTER — Other Ambulatory Visit: Payer: Self-pay | Admitting: Sports Medicine

## 2020-04-07 ENCOUNTER — Ambulatory Visit (INDEPENDENT_AMBULATORY_CARE_PROVIDER_SITE_OTHER): Payer: Medicaid Other | Admitting: Sports Medicine

## 2020-04-07 ENCOUNTER — Other Ambulatory Visit: Payer: Self-pay

## 2020-04-07 DIAGNOSIS — L600 Ingrowing nail: Secondary | ICD-10-CM | POA: Diagnosis not present

## 2020-04-07 DIAGNOSIS — M79675 Pain in left toe(s): Secondary | ICD-10-CM | POA: Diagnosis not present

## 2020-04-07 DIAGNOSIS — IMO0002 Reserved for concepts with insufficient information to code with codable children: Secondary | ICD-10-CM

## 2020-04-07 DIAGNOSIS — M729 Fibroblastic disorder, unspecified: Secondary | ICD-10-CM | POA: Diagnosis not present

## 2020-04-07 DIAGNOSIS — M79671 Pain in right foot: Secondary | ICD-10-CM | POA: Diagnosis not present

## 2020-04-07 DIAGNOSIS — E1165 Type 2 diabetes mellitus with hyperglycemia: Secondary | ICD-10-CM

## 2020-04-07 DIAGNOSIS — M79672 Pain in left foot: Secondary | ICD-10-CM

## 2020-04-07 DIAGNOSIS — Z794 Long term (current) use of insulin: Secondary | ICD-10-CM

## 2020-04-07 DIAGNOSIS — E118 Type 2 diabetes mellitus with unspecified complications: Secondary | ICD-10-CM

## 2020-04-07 MED ORDER — AMOXICILLIN-POT CLAVULANATE 875-125 MG PO TABS: 1.0000 | ORAL_TABLET | Freq: Two times a day (BID) | ORAL | 0 refills | Status: DC

## 2020-04-07 MED ORDER — NEOMYCIN-POLYMYXIN-HC 3.5-10000-1 OT SOLN: OTIC | 0 refills | Status: DC

## 2020-04-07 MED ORDER — FLUCONAZOLE 150 MG PO TABS: 150.0000 mg | ORAL_TABLET | Freq: Once | ORAL | 0 refills | Status: DC

## 2020-04-07 MED FILL — AMOX-CLAV 875-125 MG TABLET: 875-125 | 14 days supply | Qty: 28 | Fill #0

## 2020-04-07 MED FILL — NEO/POLYMYXIN/HC EAR SOLN: 3.5-10000-1 | 15 days supply | Qty: 10 | Fill #0

## 2020-04-07 MED FILL — FLUCONAZOLE 150 MG TABLET: 150 | 1 days supply | Qty: 1 | Fill #0

## 2020-04-07 NOTE — Progress Notes (Signed)
Subjective: Sarah Garrison is a 39 y.o. female returns to office for evaluation of left great toe reports that she tried to trim out an ingrown and now the sore at the left hallux medial border.  Patient also reports that she still struggles with pains in her heel but got new tennis shoes that seem to help a little bit.  Patient reports that she is recovering from Covid and has a speech deficit now from the medication that she was treated with when she was recovering from Covid.  Currently in speech therapy.  Last A1c 11  Patient Active Problem List   Diagnosis Date Noted  . Hip pain 12/08/2019  . Hyperlipidemia associated with type 2 diabetes mellitus (HCC) 11/23/2019  . History of COVID-19 10/27/2019  . Stuttering 10/27/2019  . Depression with anxiety 10/27/2019  . Obesity, Class III, BMI 40-49.9 (morbid obesity) (HCC) 06/30/2019  . OSA (obstructive sleep apnea) 02/16/2019  . Essential hypertension 06/22/2018  . Uncontrolled type 2 diabetes mellitus with complication, with long-term current use of insulin (HCC) 12/13/2016  . Asthma 07/30/2016  . 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/07/2016  . DM neuropathy, type II diabetes mellitus (HCC) 08/24/2015    Current Outpatient Medications on File Prior to Visit  Medication Sig Dispense Refill  . acetaminophen (TYLENOL) 500 MG tablet Take 1 tablet (500 mg total) by mouth every 6 (six) hours as needed. 30 tablet 0  . albuterol (VENTOLIN HFA) 108 (90 Base) MCG/ACT inhaler Inhale 1-2 puffs into the lungs every 6 (six) hours as needed for wheezing or shortness of breath. 18 g 1  . amoxicillin (AMOXIL) 500 MG capsule Take 1 capsule (500 mg total) by mouth 3 (three) times daily. 30 capsule 0  . ARIPiprazole (ABILIFY) 5 MG tablet Take 1 tablet (5 mg total) by mouth daily. 30 tablet 2  . atorvastatin (LIPITOR) 20 MG tablet Take 1 tablet (20 mg total) by mouth daily. 90 tablet 3  . Continuous Blood Gluc Receiver (DEXCOM G6 RECEIVER) DEVI 1 each by  Misc.(Non-Drug; Combo Route) route daily.    . Continuous Blood Gluc Sensor (DEXCOM G6 SENSOR) MISC SMARTSIG:1 Topical Every 10 Days    . Continuous Blood Gluc Transmit (DEXCOM G6 TRANSMITTER) MISC USE TO CHECK BLOOD SUGAR EVERY 3 MONTHS    . cyclobenzaprine (FLEXERIL) 5 MG tablet Take 1-2 tablets (5-10 mg total) by mouth 2 (two) times daily as needed for muscle spasms. 24 tablet 0  . diclofenac (VOLTAREN) 50 MG EC tablet Take 1 tablet (50 mg total) by mouth 2 (two) times daily. 20 tablet 0  . fluticasone (FLONASE) 50 MCG/ACT nasal spray Place 2 sprays into both nostrils daily. 16 g 1  . fluticasone-salmeterol (ADVAIR HFA) 115-21 MCG/ACT inhaler Inhale 2 puffs into the lungs 2 (two) times daily. 1 Inhaler 12  . insulin aspart (NOVOLOG) 100 UNIT/ML injection USE VIA INSULIN PUMP. MAX TOTAL DAILY DOSE OF 200 UNITS    . insulin degludec (TRESIBA) 100 UNIT/ML FlexTouch Pen Inject into the skin.    . Insulin Disposable Pump (OMNIPOD DASH 5 PACK PODS) MISC Take by mouth every other day.    . Insulin Human (INSULIN PUMP) SOLN Inject 1 each into the skin See admin instructions. Medication: Novolog 100 units/ml injection. Takes 100 units via pump per patient.    . Lactulose 20 GM/30ML SOLN Take 30 mLs (20 g total) by mouth at bedtime. For 3 nights then as needed for constipation 450 mL 0  . losartan (COZAAR) 50 MG tablet Take 1  tablet (50 mg total) by mouth daily. 90 tablet 3  . Misc. Devices MISC CPAP therapy on autopap 5-15.  Needs Small size Fisher[employer]Paykel Full Face Mask Simplus  mask and heated humidification. 1 each 0  . nystatin (MYCOSTATIN) 100000 UNIT/ML suspension Take 5 mLs by mouth 4 (four) times daily.    . pantoprazole (PROTONIX) 40 MG tablet Take 1 tablet (40 mg total) by mouth daily. 30 tablet 3  . sertraline (ZOLOFT) 50 MG tablet Take 1 tablet (50 mg total) by mouth daily. 30 tablet 3  . sitaGLIPtin (JANUVIA) 100 MG tablet Take by mouth.    . TRUEPLUS PEN NEEDLES 32G X 4 MM MISC USE 3 TIMES  DAILY AS DIRECTED 100 each 5  . VITAMIN D PO Take 1 tablet by mouth 2 (two) times daily.    . Cetirizine HCl 10 MG CAPS Take 1 capsule (10 mg total) by mouth daily. 30 capsule 0   No current facility-administered medications on file prior to visit.    Allergies  Allergen Reactions  . Morphine And Related Shortness Of Breath  . Glyburide Diarrhea and Other (See Comments)    Reaction:  Nose bleeds   . Ivp Dye [Iodinated Diagnostic Agents] Other (See Comments)    Shortness of breath.    . Oxycodone Other (See Comments)    Pt states that this medication makes her "dream about rabbits chasing" her.    . Vicodin [Hydrocodone-Acetaminophen] Other (See Comments)    Pt states that this medication makes her "dream about rabbits chasing" her.     Family History  Problem Relation Age of Onset  . Asthma Mother   . Kidney failure Mother   . Brain cancer Mother   . Asthma Father   . Other Father        surgery on stomach but don't know from what  . Diabetes Brother   . Diabetes Maternal Grandmother     Past Surgical History:  Procedure Laterality Date  . CESAREAN SECTION    . HERNIA REPAIR    . TUBAL LIGATION    . VENTRAL HERNIA REPAIR N/A 11/27/2017   Procedure: VENTRAL HERNIA REPAIR ERAS PATHWAY;  Surgeon: Harriette Bouillon, MD;  Location: [facility];  Service: General;  Laterality: N/A;  . WISDOM TOOTH EXTRACTION      Social History   Socioeconomic History  . Marital status: Single    Spouse name: Not on file  . Number of children: 4  . Years of education: Not on file  . Highest education level: Not on file  Occupational History  . Not on file  Tobacco Use  . Smoking status: Former Smoker    Packs/day: 0.10    Years: 2.00    Pack years: 0.20    Types: Cigarettes    Quit date: 11/27/2017    Years since quitting: 2.3  . Smokeless tobacco: Never Used  Vaping Use  . Vaping Use: Never used  Substance and Sexual Activity  . Alcohol use: No  . Drug use: No  .  Sexual activity: Yes    Birth control/protection: Surgical  Other Topics Concern  . Not on file  Social History Narrative   Right handed   No caffeine use   Diet coke sometimes    Social Determinants of Health   Financial Resource Strain:   . Difficulty of Paying Living Expenses: Not on file  Food Insecurity:   . Worried About Programme researcher, broadcasting/film/video in the Last Year: Not  on file  . Ran Out of Food in the Last Year: Not on file  Transportation Needs:   . Lack of Transportation (Medical): Not on file  . Lack of Transportation (Non-Medical): Not on file  Physical Activity:   . Days of Exercise per Week: Not on file  . Minutes of Exercise per Session: Not on file  Stress:   . Feeling of Stress : Not on file  Social Connections:   . Frequency of Communication with Friends and Family: Not on file  . Frequency of Social Gatherings with Friends and Family: Not on file  . Attends Religious Services: Not on file  . Active Member of Clubs or Organizations: Not on file  . Attends Banker Meetings: Not on file  . Marital Status: Not on file    Objective:   General:  Alert and oriented x 3, in no acute distress  Dermatology: Skin is warm, dry, and supple bilateral. Nails are within normal limits but there is mild curvature noted at the left hallux medial and lateral nail borders with mild soft tissue swelling at the medial nail fold no active drainage no significant redness warmth, no open lesions present bilateral.   Vascular: Dorsalis Pedis and Posterior Tibial pedal pulses are 2/4 bilateral. + hair growth noted bilateral. Capillary Fill Time is 3 seconds in all digits. No varicosities, No edema bilateral lower extremities.   Neurological: Sensation grossly intact to light touch bilateral.  Musculoskeletal: There is tenderness to palpation at the left hallux medial greater than lateral nail fold and there is residual tenderness to the medial calcaneal tubercale and through  the insertion of the plantar fascia on the right greater than left foot. No pain with compression to calcaneus or application of tuning fork. There is decreased Ankle joint range of motion bilateral. All other jointsrange of motion  within normal limits bilateral. +Pes planus. Strength 5/5 bilateral.   Assessment and Plan: Problem List Items Addressed This Visit      Endocrine   Uncontrolled type 2 diabetes mellitus with complication, with long-term current use of insulin (HCC)   Relevant Medications   sitaGLIPtin (JANUVIA) 100 MG tablet    Other Visit Diagnoses    Ingrowing nail    -  Primary   Toe pain, left       Fasciitis       Right foot pain       Left foot pain           -Complete examination performed.  -Prescribed Augmentin for patient to take as instructed for preventative measures at left hallux ingrown nail -Prescribed Corticosporin solution and advised patient to soak once daily using Epson salt to see if this will help her symptoms improve at the left great toe advised patient if no better we will consider a nail procedure however due to her uncontrolled diabetes she is at risk of further infection and poor healing -Advised patient to continue with good supportive shoes daily stretching and icing as needed for her chronic ongoing heel pain also advised patient to consider in the future steroid injections if her heels continue to worsen or physical therapy and also advised her for any surgery to her feet her A1c has to be under better control in order for this to be considered -Return in 2 weeks if no better at the nail for possible nail procedure Asencion Islam, DPM

## 2020-04-12 ENCOUNTER — Ambulatory Visit: Payer: Medicaid Other | Admitting: Physical Therapy

## 2020-04-12 ENCOUNTER — Ambulatory Visit: Payer: Medicaid Other

## 2020-04-12 ENCOUNTER — Other Ambulatory Visit: Payer: Self-pay

## 2020-04-12 DIAGNOSIS — G8929 Other chronic pain: Secondary | ICD-10-CM

## 2020-04-12 DIAGNOSIS — M25511 Pain in right shoulder: Secondary | ICD-10-CM | POA: Diagnosis not present

## 2020-04-12 DIAGNOSIS — M2560 Stiffness of unspecified joint, not elsewhere classified: Secondary | ICD-10-CM

## 2020-04-12 DIAGNOSIS — R2689 Other abnormalities of gait and mobility: Secondary | ICD-10-CM

## 2020-04-12 NOTE — Therapy (Signed)
[place], [place], [address] Phone: [phone]   Fax:  [phone]  Physical Therapy Treatment  Patient Details  Name: Sarah Garrison MRN: [id number] Date of Birth: 06-30-1980 Referring Provider (PT): Aundra Dubin, [place]   Encounter Date: 04/12/2020   PT End of Session - 04/12/20 1157    Visit Number 19    Number of Visits 20    Date for PT Re-Evaluation 04/27/20    Authorization Type MED PAY ASSURANCE; [employer] Time Period 3 visits from 8/10-9/14/21, 12 visits from 11/10/10-75/1/70; physician re-cert until 01/74/94    PT Start Time 1151   pt arrived late   PT Stop Time 64    PT Time Calculation (min) 39 min    Activity Tolerance Patient tolerated treatment well    Behavior During Therapy [facility] for tasks assessed/performed           Past Medical History:  Diagnosis Date  . Asthma   . COVID-19   . Diabetes mellitus without complication ([place])   . GERD (gastroesophageal reflux disease)   . Hypertension   . Seizures ([place])     Past Surgical History:  Procedure Laterality Date  . CESAREAN SECTION    . HERNIA REPAIR    . TUBAL LIGATION    . VENTRAL HERNIA REPAIR N/A 11/27/2017   Procedure: [place];  Surgeon: Erroll Luna, MD;  Location: [place];  Service: General;  Laterality: N/A;  . WISDOM TOOTH EXTRACTION      There were no vitals filed for this visit.   Subjective Assessment - 04/12/20 1155    Subjective Pt states her right knee is bothering her a lot today, but not her hip or shoulder. She did go to the doctor for her toenail and goes back again in 2 weeks.    Currently in Pain? Yes    Pain Location Knee    Pain Orientation Right                             [facility] Adult PT Treatment/Exercise - 04/12/20 0001      Knee/Hip Exercises: Aerobic   Nustep L5 x 5 min UE/LE      Shoulder  Exercises: Standing   Extension Strengthening;Right;Left;10 reps;Theraband    Theraband Level (Shoulder Extension) Level 1 (Yellow)    Row Strengthening;Both;10 reps    Theraband Level (Shoulder Row) Level 2 (Red)    Other Standing Exercises side LAT PD x 10 L YTB      Shoulder Exercises: Pulleys   Flexion 3 minutes    Scaption 3 minutes      Shoulder Exercises: Therapy Ball   Flexion Both;5 reps    Flexion Limitations seated      Shoulder Exercises: Stretch   Other Shoulder Stretches ladder finger walks x5                    PT Short Term Goals - 03/24/20 1020      PT SHORT TERM GOAL #1   Title Pt will be ind in an initail HEP    Time 3    Period Weeks    Status Achieved    Target Date 01/22/20      PT SHORT TERM GOAL #2   Title Ptwill voice understanding/return demonstration of measure to reduce and manage R hip pain  Baseline use heat and ice, use massage pillow    Time 3    Period Weeks    Status Achieved      PT SHORT TERM GOAL #3   Title Establish LTGs following the initial 4 visits of PT care    Time 3    Period Weeks    Status Achieved      PT SHORT TERM GOAL #4   Title Pt will be independent with self correcting her forward head and rounded shoulder posture    Baseline Needs cueing    Time 3    Period Weeks    Status On-going    Target Date 02/02/20             PT Long Term Goals - 03/31/20 1112      PT LONG TERM GOAL #1   Title pt will be independent with advanced HEP    Baseline achieved for cervical, needs further progression for hip/LE    Status Partially Met      PT LONG TERM GOAL #2   Title Pt will have full pain free shoulder ROM    Baseline see objective measures, pain with all reported today    Status On-going      PT LONG TERM GOAL #3   Title Pt will be able to lift 10 lbs overhead for home tasks    Baseline unable to lift her arm overhead unweighted, reports she is unable to lift    Status On-going      PT LONG  TERM GOAL #4   Title Pt will be able to stand comfortably for 30 minutes to be able to perform household chores    Baseline about 20-30 min    Status On-going      PT LONG TERM GOAL #5   Title Pt will be able to amb around the grocery store without discomfort    Baseline She goes to the store now, but takes a kid to help her and leans on the cart (gets in the scooter when leg really hurts)    Status On-going                 Plan - 04/12/20 1158    Clinical Impression Statement Pt requests to focus on R shoulder ROM today. She was able to tolerate AAROM well with pulleys. She did have an issue with her omnipod overheating, requiring a break to ice it. Pt is veyr weak in RUE, but tolerated AAROM and gentle strengthening today. Instructed to bring questions to next session, in preparation for DC.    Personal Factors and Comorbidities Time since onset of injury/illness/exacerbation;Comorbidity 3+    Comorbidities Hx of Covid-19, DM, asthma    Examination-Activity Limitations Bathing;Bend;Caring for Others;Carry;Dressing;Lift;Toileting;Stand;Stairs;Squat;Sleep;Sit;Reach Overhead;Locomotion Level;Transfers    Rehab Potential Fair    PT Frequency 1x / week    PT Duration 6 weeks    PT Treatment/Interventions ADLs/Self Care Home Management;Cryotherapy;Electrical Stimulation;Ultrasound;Traction;Moist Heat;Iontophoresis 2m/ml Dexamethasone;Gait training;Stair training;Functional mobility training;Therapeutic activities;Therapeutic exercise;Manual techniques;Patient/family education;Passive range of motion;Dry needling;Taping;Vasopneumatic Device    PT Next Visit Plan DC with HEP    PT Home Exercise Plan Access Code: [email or web address] 3 way hip with green tband, quad stretch    Consulted and Agree with Plan of Care Patient           Patient will benefit from skilled therapeutic intervention in order to improve the following deficits and impairments:  Abnormal gait, Difficulty walking, Decreased  range of motion, Obesity, Decreased activity  tolerance, Pain, Impaired flexibility, Decreased mobility, Decreased strength, Postural dysfunction  Visit Diagnosis: Chronic right shoulder pain  Chronic hip pain, right  Other abnormalities of gait and mobility  Limited joint range of motion (ROM)     Problem List Patient Active Problem List   Diagnosis Date Noted  . Hip pain 12/08/2019  . Hyperlipidemia associated with type 2 diabetes mellitus ([place]) 11/23/2019  . History of COVID-19 10/27/2019  . Stuttering 10/27/2019  . Depression with anxiety 10/27/2019  . Obesity, Class III, BMI 40-49.9 (morbid obesity) ([place]) 06/30/2019  . OSA (obstructive sleep apnea) 02/16/2019  . Essential hypertension 06/22/2018  . Uncontrolled type 2 diabetes mellitus with complication, with long-term current use of insulin ([place]) 12/13/2016  . Asthma 07/30/2016  . 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/07/2016  . DM neuropathy, type II diabetes mellitus ([place]) 08/24/2015    Izell [place], PT, DPT 04/12/2020, 12:32 PM  [facility] [address] [place], [place], [address] Phone: [phone]   Fax:  [phone]  Name: Sarah Garrison MRN: [id number] Date of Birth: 28-Feb-1981

## 2020-04-13 ENCOUNTER — Other Ambulatory Visit: Payer: Self-pay | Admitting: Internal Medicine

## 2020-04-13 MED FILL — DEXCOM G6 RECEIVER DEVI: 1 days supply | Qty: 1 | Fill #0

## 2020-04-13 MED FILL — DEXCOM G6 TRANSMITTER MISC: 90 days supply | Qty: 1 | Fill #0

## 2020-04-14 MED FILL — TRUEplus 5-BEVEL PEN NEEDLE: 32G X 4 MM | 30 days supply | Qty: 100 | Fill #0

## 2020-04-19 ENCOUNTER — Ambulatory Visit: Payer: Medicaid Other | Admitting: Physical Therapy

## 2020-04-21 ENCOUNTER — Ambulatory Visit: Payer: Medicaid Other | Admitting: Sports Medicine

## 2020-04-27 ENCOUNTER — Ambulatory Visit: Payer: Medicaid Other | Admitting: Physical Therapy

## 2020-04-27 ENCOUNTER — Other Ambulatory Visit: Payer: Self-pay

## 2020-04-27 ENCOUNTER — Encounter: Payer: Self-pay | Admitting: Physical Therapy

## 2020-04-27 DIAGNOSIS — M25511 Pain in right shoulder: Secondary | ICD-10-CM | POA: Diagnosis not present

## 2020-04-27 DIAGNOSIS — G8929 Other chronic pain: Secondary | ICD-10-CM

## 2020-04-27 DIAGNOSIS — R2689 Other abnormalities of gait and mobility: Secondary | ICD-10-CM

## 2020-04-27 DIAGNOSIS — M2560 Stiffness of unspecified joint, not elsewhere classified: Secondary | ICD-10-CM

## 2020-04-27 NOTE — Therapy (Signed)
[place], [place], [address] Phone: [phone]   Fax:  [phone]  Physical Therapy Treatment  Patient Details  Name: Sarah Garrison MRN: [id number] Date of Birth: 09/26/1980 Referring Provider (PT): Aundra Dubin, [place]   Encounter Date: 04/27/2020   PT End of Session - 04/27/20 0926    Visit Number 20    PT Start Time 0917    PT Stop Time 0947    PT Time Calculation (min) 30 min    Activity Tolerance Patient limited by pain    Behavior During Therapy [facility] for tasks assessed/performed           Past Medical History:  Diagnosis Date  . Asthma   . COVID-19   . Diabetes mellitus without complication ([place])   . GERD (gastroesophageal reflux disease)   . Hypertension   . Seizures ([place])     Past Surgical History:  Procedure Laterality Date  . CESAREAN SECTION    . HERNIA REPAIR    . TUBAL LIGATION    . VENTRAL HERNIA REPAIR N/A 11/27/2017   Procedure: [place];  Surgeon: Erroll Luna, MD;  Location: [place];  Service: General;  Laterality: N/A;  . WISDOM TOOTH EXTRACTION      There were no vitals filed for this visit.   Subjective Assessment - 04/27/20 0920    Subjective Rt arm 7/10.  No knee or hip pain . My sugar dropped at [employer] and I passed out  .  Having more pain than usual.    Currently in Pain? Yes    Pain Score 7     Pain Location Shoulder    Pain Orientation Right    Pain Descriptors / Indicators Aching    Pain Onset More than a month ago    Pain Frequency Constant    Aggravating Factors  using arm    Pain Relieving Factors heat , rest              [facility] PT Assessment - 04/27/20 0001      AROM   Right Shoulder Flexion 85 Degrees    Right Shoulder ABduction 90 Degrees      Strength   Overall Strength Comments unable to tolerate on Rt UE , 3/5 to 3+/5 , LE NT     Right Hip Flexion 4/5    Right Knee Flexion 4/5     Right Knee Extension 4/5             [facility] Adult PT Treatment/Exercise - 04/27/20 0001      Self-Care   Self-Care Posture;Heat/Ice Application;Other Self-Care Comments    Posture forward head, cervical, scapular retraction    Heat/Ice Application ice, or heat OK     Other Self-Care Comments  DC, final HEP       Knee/Hip Exercises: Aerobic   Nustep L 4 pain in Rt UE , used LE for 6 min  during goal check , discussion       Shoulder Exercises: Supine   External Rotation Strengthening;Both;10 reps    Theraband Level (Shoulder External Rotation) Level 2 (Red)    Other Supine Exercises flexion AAROM x 10 , painful       Shoulder Exercises: Standing   Extension Strengthening;Right;Left;20 reps;Theraband    Theraband Level (Shoulder Extension) Level 2 (Red)    Row Strengthening;Both;20 reps    Theraband Level (Shoulder Row) Level 3 (Green)  Manual Therapy   Manual therapy comments brief PROM to Rt UE, limited by pain                     PT Short Term Goals - 04/27/20 0936      PT SHORT TERM GOAL #1   Title Pt will be ind in an initail HEP    Status Achieved      PT SHORT TERM GOAL #2   Title Ptwill voice understanding/return demonstration of measure to reduce and manage R hip pain    Status Achieved      PT SHORT TERM GOAL #3   Title Establish LTGs following the initial 4 visits of PT care    Status Achieved      PT SHORT TERM GOAL #4   Title Pt will be independent with self correcting her forward head and rounded shoulder posture    Status Partially Met             PT Long Term Goals - 04/27/20 0941      PT LONG TERM GOAL #1   Title pt will be independent with advanced HEP    Status Achieved      PT LONG TERM GOAL #2   Title Pt will have full pain free shoulder ROM    Baseline very limited, pain today    Status Not Met      PT LONG TERM GOAL #3   Title Pt will be able to lift 10 lbs overhead for home tasks    Baseline unable    Status  Not Met      PT LONG TERM GOAL #4   Title Pt will be able to stand comfortably for 30 minutes to be able to perform household chores    Status Achieved      PT LONG TERM GOAL #5   Title Pt will be able to amb around the grocery store without discomfort    Baseline walks with a cart, limits her purchases    Status Partially Met                 Plan - 04/27/20 0927    Clinical Impression Statement Pt with difficulty performing active and AAROM exercises for shoulder due to pain .  She was fatigued and has had low blood sugar this week.  Verbally reviewed HEP, strategies for pain mgmt.  All questons answered and she is discharged.  She was encouraged to make an appt with ortho for more interventions, imaging .  She did not fall on her Rt UE but does describes radiating pain into Rt lower arm, fingers.    PT Treatment/Interventions ADLs/Self Care Home Management;Cryotherapy;Electrical Stimulation;Ultrasound;Traction;Moist Heat;Iontophoresis 30m/ml Dexamethasone;Gait training;Stair training;Functional mobility training;Therapeutic activities;Therapeutic exercise;Manual techniques;Patient/family education;Passive range of motion;Dry needling;Taping;Vasopneumatic Device    PT Next Visit Plan NA, DC    PT Home Exercise Plan Access Code: [email or web address] 3 way hip with green tband, quad stretch    Consulted and Agree with Plan of Care Patient           Patient will benefit from skilled therapeutic intervention in order to improve the following deficits and impairments:  Abnormal gait, Difficulty walking, Decreased range of motion, Obesity, Decreased activity tolerance, Pain, Impaired flexibility, Decreased mobility, Decreased strength, Postural dysfunction  Visit Diagnosis: Chronic right shoulder pain  Chronic hip pain, right  Other abnormalities of gait and mobility  Limited joint range of motion (ROM)     Problem List Patient  Active Problem List   Diagnosis Date Noted  . Hip pain  12/08/2019  . Hyperlipidemia associated with type 2 diabetes mellitus ([place]) 11/23/2019  . History of COVID-19 10/27/2019  . Stuttering 10/27/2019  . Depression with anxiety 10/27/2019  . Obesity, Class III, BMI 40-49.9 (morbid obesity) ([place]) 06/30/2019  . OSA (obstructive sleep apnea) 02/16/2019  . Essential hypertension 06/22/2018  . Uncontrolled type 2 diabetes mellitus with complication, with long-term current use of insulin ([place]) 12/13/2016  . Asthma 07/30/2016  . 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/07/2016  . DM neuropathy, type II diabetes mellitus ([place]) 08/24/2015    Sarah Garrison 04/27/2020, 9:57 AM  [facility] [address] [place], [place], [address] Phone: [phone]   Fax:  [phone]  Name: Sarah Garrison MRN: [id number] Date of Birth: 1981/02/18    PHYSICAL THERAPY DISCHARGE SUMMARY  Visits from Start of Care: 20  Current functional level related to goals / functional outcomes: See above Painful and limited in use of Rt UE.  Able to stand and walk longer periods than before    Remaining deficits: Pain, Rt UE strength, endurance, posture, ROM    Education / Equipment: HEP, RICE , self care throughout episode  Plan: Patient agrees to discharge.  Patient goals were partially met. Patient is being discharged due to lack of progress.  ?????     Raeford Razor, PT 04/27/20 9:58 AM Phone: [phone] Fax: [phone]

## 2020-05-02 ENCOUNTER — Other Ambulatory Visit: Payer: Self-pay

## 2020-05-02 ENCOUNTER — Ambulatory Visit: Payer: No Typology Code available for payment source | Attending: Family Medicine | Admitting: *Deleted

## 2020-05-02 DIAGNOSIS — Z3042 Encounter for surveillance of injectable contraceptive: Secondary | ICD-10-CM

## 2020-05-02 MED ORDER — MEDROXYPROGESTERONE ACETATE 150 MG/ML IM SUSP
150.0000 mg | Freq: Once | INTRAMUSCULAR | Status: AC
Start: 2020-05-02 — End: 2020-05-02
  Administered 2020-05-02: 150 mg via INTRAMUSCULAR

## 2020-05-02 MED FILL — NovoLOG 100 UNIT/ML SOLN: 100 | 30 days supply | Qty: 60 | Fill #2

## 2020-05-02 MED FILL — TRUEplus 5-BEVEL PEN NEEDLE: 32G X 4 MM | 30 days supply | Qty: 100 | Fill #0

## 2020-05-02 MED FILL — TRESIBA FLEXTOUCH 100 UNITS: 100 | 7 days supply | Qty: 3 | Fill #1

## 2020-05-02 NOTE — Progress Notes (Signed)
Date last pap: 02/03/2019. Last Depo-Provera: 02/09/2020 Side Effects if any: n/a Serum HCG indicated? n/a. Depo-Provera 150 mg IM given by T.Sharlet Salina, RN. Next appointment due February 14-28

## 2020-05-03 ENCOUNTER — Ambulatory Visit: Payer: Medicaid Other

## 2020-05-06 MED FILL — LISINOPRIL 20 MG TABLET: 20 | 90 days supply | Qty: 90 | Fill #0

## 2020-05-23 MED FILL — PANTOPRAZOLE SOD DR 40 MG T: 40 | 30 days supply | Qty: 30 | Fill #1

## 2020-05-23 MED FILL — TRESIBA FLEXTOUCH 100 UNITS: 100 | 7 days supply | Qty: 3 | Fill #2

## 2020-06-08 ENCOUNTER — Ambulatory Visit: Payer: Self-pay | Admitting: *Deleted

## 2020-06-08 NOTE — Telephone Encounter (Signed)
C/o increasing tingling and pain in fingertips, hands and arms. Patient reports she has had pain since she was a diabetic and it is becoming worse. C/o tips of fingers are red in am and burn and sometimes swell. Fingers are sensitive to touch , difficulty touching objects. Denies fever, difficulty breathing. Patient reports she has had to miss work due to pain. Pain comes and goes, but mostly pain when she wakes up in am. Taking ibuprofen for pain . appt scheduled for 06/20/20. Please notify patient if earlier appt available. Care advise given. Patient verbalized understanding of care advise and to call back or go to [facility] or ED if symptoms worsen.   Reason for Disposition . [1] MODERATE pain (e.g., interferes with normal activities) AND [2] present > 3 days  Answer Assessment - Initial Assessment Questions 1. ONSET: "When did the pain start?"      When she became a diabetic  2. LOCATION and RADIATION: "Where is the pain located?"  (e.g., fingertip, around nail, joint, entire  finger)      Fingertips and bilateral arms and hands 3. SEVERITY: "How bad is the pain?" "What does it keep you from doing?"   (Scale 1-10; or mild, moderate, severe)  - MILD (1-3): doesn't interfere with normal activities.   - MODERATE (4-7): interferes with normal activities or awakens from sleep.  - SEVERE (8-10): excruciating pain, unable to hold a glass of water or bend finger even a little.     severe 4. APPEARANCE: "What does the finger look like?" (e.g., redness, swelling, bruising, pallor)     Redness and sometimes swelling  5. WORK OR EXERCISE: "Has there been any recent work or exercise that involved this part of the body?"     No  6. CAUSE: "What do you think is causing the pain?"     unknown 7. AGGRAVATING FACTORS: "What makes the pain worse?" (e.g., using computer)     No  8. OTHER SYMPTOMS: "Do you have any other symptoms?" (e.g., fever, neck pain, numbness)     No  9. PREGNANCY: "Is there any chance you  are pregnant?" "When was your last menstrual period?"     No  Protocols used: FINGER PAIN-A-AH

## 2020-06-10 MED FILL — TRUEplus 5-BEVEL PEN NEEDLE: 32G X 4 MM | 30 days supply | Qty: 100 | Fill #1

## 2020-06-10 MED FILL — NovoLOG 100 UNIT/ML SOLN: 100 | 30 days supply | Qty: 60 | Fill #3

## 2020-06-10 MED FILL — TRESIBA FLEXTOUCH 100 UNITS: 100 | 14 days supply | Qty: 6 | Fill #3

## 2020-06-13 ENCOUNTER — Other Ambulatory Visit: Payer: Self-pay

## 2020-06-13 ENCOUNTER — Encounter (HOSPITAL_COMMUNITY): Payer: Self-pay

## 2020-06-13 ENCOUNTER — Ambulatory Visit (HOSPITAL_COMMUNITY)
Admission: EM | Admit: 2020-06-13 | Discharge: 2020-06-13 | Disposition: A | Discharge status: Home / Self Care | Payer: Medicaid Other | Attending: Family Medicine | Admitting: Family Medicine

## 2020-06-13 DIAGNOSIS — U071 COVID-19: Secondary | ICD-10-CM | POA: Diagnosis not present

## 2020-06-13 DIAGNOSIS — Z20822 Contact with and (suspected) exposure to covid-19: Secondary | ICD-10-CM

## 2020-06-13 DIAGNOSIS — J069 Acute upper respiratory infection, unspecified: Secondary | ICD-10-CM | POA: Diagnosis not present

## 2020-06-13 NOTE — ED Triage Notes (Signed)
Pt presents with cough, nasal congestion and body ache sx 2 days. Denies fever, sob.   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    Pt do not have a COVID vaccine, she is waiting for her PCP to clear her.

## 2020-06-13 NOTE — Discharge Instructions (Signed)
Go home to rest Drink plenty of fluids Take Tylenol for pain or fever You may take over-the-counter cough and cold medicines as needed You must quarantine at home until your test result is available You can check for your test result in MyChart  

## 2020-06-13 NOTE — ED Provider Notes (Signed)
[facility]    CSN: [id number] Arrival date & time: 06/13/20  0941      History   Chief Complaint Chief Complaint  Patient presents with  . Cough  . Generalized Body Aches  . Nasal Congestion    HPI Sarah Garrison is a 40 y.o. female.   HPI   Patient is here with cough nasal congestion and body aches for 2 days.  No fever or chills.  No change in her usual shortness of breath.  No sore throat. Patient has not had COVID vaccinations.  States she had multiple antibody infusions when she was in the ICU and her PCP has told her not to be vaccinated yet. Patient did have COVID in April 2021.  She became very sick and was in the ICU.  She is left with neurologic deficits.  Also shortness of breath with exertion She works in a group home.  Would like to be tested prior to going back to work tomorrow. She has underlying diabetes which is in good control per patient.  Also hypertension.  She has asthma.  Has been using her asthma inhalers.  Past Medical History:  Diagnosis Date  . Asthma   . COVID-19   . Diabetes mellitus without complication (HCC)   . GERD (gastroesophageal reflux disease)   . Hypertension   . Seizures [facility])     Patient Active Problem List   Diagnosis Date Noted  . Hip pain 12/08/2019  . Hyperlipidemia associated with type 2 diabetes mellitus (HCC) 11/23/2019  . History of COVID-19 10/27/2019  . Stuttering 10/27/2019  . Depression with anxiety 10/27/2019  . Obesity, Class III, BMI 40-49.9 (morbid obesity) (HCC) 06/30/2019  . OSA (obstructive sleep apnea) 02/16/2019  . Essential hypertension 06/22/2018  . Uncontrolled type 2 diabetes mellitus with complication, with long-term current use of insulin (HCC) 12/13/2016  . Asthma 07/30/2016  . 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/07/2016  . DM neuropathy, type II diabetes mellitus (HCC) 08/24/2015    Past Surgical History:  Procedure Laterality Date  . CESAREAN SECTION    . HERNIA REPAIR    . TUBAL  LIGATION    . VENTRAL HERNIA REPAIR N/A 11/27/2017   Procedure: VENTRAL HERNIA REPAIR ERAS PATHWAY;  Surgeon: Harriette Bouillon, MD;  Location: [facility];  Service: General;  Laterality: N/A;  . WISDOM TOOTH EXTRACTION      OB History    Gravida  5   Para  4   Term      Preterm      AB  1   Living  4     SAB  1   IAB      Ectopic      Multiple      Live Births               Home Medications    Prior to Admission medications   Medication Sig Start Date End Date Taking? Authorizing Provider  Fluticasone-Salmeterol (ADVAIR) 100-50 MCG/DOSE AEPB Inhale 1 puff into the lungs 2 (two) times daily.   Yes [provider]  albuterol (VENTOLIN HFA) 108 (90 Base) MCG/ACT inhaler Inhale 1-2 puffs into the lungs every 6 (six) hours as needed for wheezing or shortness of breath. 01/01/19   Storm Frisk, MD  atorvastatin (LIPITOR) 20 MG tablet Take 1 tablet (20 mg total) by mouth daily. 11/23/19   Storm Frisk, MD  Continuous Blood Gluc Receiver (DEXCOM G6 RECEIVER) DEVI 1 each by Misc.(Non-Drug; Combo Route)  route daily. 02/29/20   [provider]  Continuous Blood Gluc Sensor (DEXCOM G6 SENSOR) MISC SMARTSIG:1 Topical Every 10 Days 12/03/19   [provider]  Continuous Blood Gluc Transmit (DEXCOM G6 TRANSMITTER) MISC USE TO CHECK BLOOD SUGAR EVERY 3 MONTHS 10/16/19   [provider]  fluticasone (FLONASE) 50 MCG/ACT nasal spray Place 2 sprays into both nostrils daily. 03/22/20   Hoy Register, MD  fluticasone-salmeterol (ADVAIR HFA) 830-94 MCG/ACT inhaler Inhale 2 puffs into the lungs 2 (two) times daily. 11/23/19   Storm Frisk, MD  insulin aspart (NOVOLOG) 100 UNIT/ML injection USE VIA INSULIN PUMP. MAX TOTAL DAILY DOSE OF 200 UNITS 11/23/19   [provider]  insulin degludec (TRESIBA) 100 UNIT/ML FlexTouch Pen Inject into the skin. 01/08/20   [provider]  Insulin Disposable Pump (OMNIPOD DASH 5 PACK  PODS) MISC Take by mouth every other day. 10/16/19   [provider]  Insulin Human (INSULIN PUMP) SOLN Inject 1 each into the skin See admin instructions. Medication: Novolog 100 units/ml injection. Takes 100 units via pump per patient.    [provider]  Lactulose 20 GM/30ML SOLN Take 30 mLs (20 g total) by mouth at bedtime. For 3 nights then as needed for constipation 10/23/19   Fulp, Cammie, MD  losartan (COZAAR) 50 MG tablet Take 1 tablet (50 mg total) by mouth daily. 11/23/19   Storm Frisk, MD  Misc. Devices MISC CPAP therapy on autopap 5-15.  Needs Small size Fisher[employer]Paykel Full Face Mask Simplus  mask and heated humidification. 09/13/19   Claiborne Rigg, NP  neomycin-polymyxin-hydrocortisone (CORTISPORIN) OTIC solution Apply 1-2 drops to toenail twice a day and cover with bandaid as needed 04/07/20   Asencion Islam, DPM  nystatin (MYCOSTATIN) 100000 UNIT/ML suspension Take 5 mLs by mouth 4 (four) times daily. 10/13/19   [provider]  pantoprazole (PROTONIX) 40 MG tablet Take 1 tablet (40 mg total) by mouth daily. 03/22/20   Hoy Register, MD  sertraline (ZOLOFT) 50 MG tablet Take 1 tablet (50 mg total) by mouth daily. 03/22/20   Hoy Register, MD  sitaGLIPtin (JANUVIA) 100 MG tablet Take by mouth. 02/05/20   [provider]  TRUEPLUS PEN NEEDLES 32G X 4 MM MISC USE 3 TIMES DAILY AS DIRECTED 06/16/18   Hoy Register, MD  VITAMIN D PO Take 1 tablet by mouth 2 (two) times daily.    [provider]  ARIPiprazole (ABILIFY) 5 MG tablet Take 1 tablet (5 mg total) by mouth daily. 10/27/19 06/13/20  Sater, Pearletha Furl, MD  Cetirizine HCl 10 MG CAPS Take 1 capsule (10 mg total) by mouth daily. 10/09/19 06/13/20  PokhrelRebekah Chesterfield, MD    Family History Family History  Problem Relation Age of Onset  . Asthma Mother   . Kidney failure Mother   . Brain cancer Mother   . Asthma Father   . Other Father        surgery on stomach but don't know from what   . Diabetes Brother   . Diabetes Maternal Grandmother     Social History Social History   Tobacco Use  . Smoking status: Former Smoker    Packs/day: 0.10    Years: 2.00    Pack years: 0.20    Types: Cigarettes    Quit date: 11/27/2017    Years since quitting: 2.5  . Smokeless tobacco: Never Used  Vaping Use  . Vaping Use: Never used  Substance Use Topics  . Alcohol use:  No  . Drug use: No     Allergies   Morphine and related, Glyburide, Ivp dye [iodinated diagnostic agents], Oxycodone, and Vicodin [hydrocodone-acetaminophen]   Review of Systems Review of Systems See HPI  Physical Exam Triage Vital Signs ED Triage Vitals [06/13/20 1158]  Enc Vitals Group     BP (!) 136/94     Pulse Rate (!) 109     Resp 20     Temp 98.6 F (37 C)     Temp Source Oral     SpO2 99 %     Weight      Height      Head Circumference      Peak Flow      Pain Score 0     Pain Loc      Pain Edu?      Excl. in GC?    No data found.  Updated Vital Signs BP (!) 136/94 (BP Location: Right Arm)   Pulse (!) 109   Temp 98.6 F (37 C) (Oral)   Resp 20   SpO2 99%     Physical Exam Constitutional:      General: She is not in acute distress.    Appearance: She is well-developed and well-nourished. She is obese.     Comments: No apparent distress  HENT:     Head: Normocephalic and atraumatic.     Mouth/Throat:     Mouth: Oropharynx is clear and moist.  Eyes:     Conjunctiva/sclera: Conjunctivae normal.     Pupils: Pupils are equal, round, and reactive to light.  Cardiovascular:     Rate and Rhythm: Normal rate.  Pulmonary:     Effort: Pulmonary effort is normal. No respiratory distress.     Comments: Heart and lung exam normal Abdominal:     General: There is no distension.     Palpations: Abdomen is soft.  Musculoskeletal:        General: No edema. Normal range of motion.     Cervical back: Normal range of motion.  Skin:    General: Skin is warm and dry.   Neurological:     Mental Status: She is alert.     Comments: stutters  Psychiatric:        Behavior: Behavior normal.      UC Treatments / Results  Labs (all labs ordered are listed, but only abnormal results are displayed) Labs Reviewed  SARS CORONAVIRUS 2 (TAT 6-24 HRS)    EKG   Radiology No results found.  Procedures Procedures (including critical care time)  Medications Ordered in UC Medications - No data to display  Initial Impression / Assessment and Plan / UC Course  I have reviewed the triage vital signs and the nursing notes.  Pertinent labs & imaging results that were available during my care of the patient were reviewed by me and considered in my medical decision making (see chart for details).    Testing done Check for results on my Chart Final Clinical Impressions(s) / UC Diagnoses   Final diagnoses:  Viral upper respiratory tract infection  Encounter for laboratory testing for COVID-19 virus     Discharge Instructions     Go home to rest Drink plenty of fluids Take Tylenol for pain or fever You may take over-the-counter cough and cold medicines as needed You must quarantine at home until your test result is available You can check for your test result in MyChart    ED Prescriptions  None     PDMP not reviewed this encounter.   Eustace MooreNelson, Jackalynn Art Sue, MD 06/13/20 [phone]

## 2020-06-14 LAB — SARS CORONAVIRUS 2 (TAT 6-24 HRS): SARS Coronavirus 2: POSITIVE — AB

## 2020-06-20 ENCOUNTER — Encounter: Payer: Medicaid Other | Admitting: Family Medicine

## 2020-07-18 ENCOUNTER — Ambulatory Visit: Payer: Medicaid Other | Attending: Family Medicine

## 2020-07-18 ENCOUNTER — Other Ambulatory Visit: Payer: Self-pay

## 2020-07-18 DIAGNOSIS — Z3042 Encounter for surveillance of injectable contraceptive: Secondary | ICD-10-CM

## 2020-07-18 MED ORDER — MEDROXYPROGESTERONE ACETATE 150 MG/ML IM SUSP
150.0000 mg | Freq: Once | INTRAMUSCULAR | Status: AC
  Administered 2020-07-18: 150 mg via INTRAMUSCULAR

## 2020-07-18 NOTE — Progress Notes (Signed)
Pt arrived at clinic for depo injection. Pregnancy test not needed, pt on time for injection. Pt was given IM Depo-provera and toleratd well. Pt to return May 2-16.

## 2020-07-21 MED FILL — NovoLOG 100 UNIT/ML SOLN: 100 | 30 days supply | Qty: 60 | Fill #0

## 2020-07-21 MED FILL — SERTRALINE HCL 50 MG TABLET: 50 | 30 days supply | Qty: 30 | Fill #1

## 2020-07-21 MED FILL — TRESIBA FLEXTOUCH 100 UNITS: 100 | 14 days supply | Qty: 6 | Fill #4

## 2020-07-21 MED FILL — PANTOPRAZOLE SOD DR 40 MG T: 40 | 30 days supply | Qty: 30 | Fill #2

## 2020-08-05 ENCOUNTER — Other Ambulatory Visit: Payer: Self-pay | Admitting: Nurse Practitioner

## 2020-08-05 MED FILL — ATORVASTATIN CALCIUM 20 MG: 20 | 30 days supply | Qty: 30 | Fill #0

## 2020-08-05 MED FILL — LISINOPRIL 20 MG TABLET: 20 | 90 days supply | Qty: 90 | Fill #0

## 2020-08-08 ENCOUNTER — Other Ambulatory Visit: Payer: Self-pay

## 2020-08-08 ENCOUNTER — Encounter (HOSPITAL_COMMUNITY): Payer: Self-pay

## 2020-08-08 ENCOUNTER — Other Ambulatory Visit: Payer: Self-pay | Admitting: Nurse Practitioner

## 2020-08-08 ENCOUNTER — Ambulatory Visit (HOSPITAL_COMMUNITY)
Admission: EM | Admit: 2020-08-08 | Discharge: 2020-08-08 | Disposition: A | Discharge status: Home / Self Care | Payer: Medicaid Other

## 2020-08-08 DIAGNOSIS — J3089 Other allergic rhinitis: Secondary | ICD-10-CM | POA: Diagnosis not present

## 2020-08-08 DIAGNOSIS — J069 Acute upper respiratory infection, unspecified: Secondary | ICD-10-CM

## 2020-08-08 MED ORDER — LIDOCAINE VISCOUS HCL 2 % MT SOLN: 10.0000 mL | OROMUCOSAL | 0 refills | Status: DC | PRN

## 2020-08-08 MED ORDER — CETIRIZINE HCL 10 MG PO TABS: 10.0000 mg | ORAL_TABLET | Freq: Every day | ORAL | 2 refills | Status: DC

## 2020-08-08 MED ORDER — FLUTICASONE PROPIONATE 50 MCG/ACT NA SUSP: 1.0000 | Freq: Two times a day (BID) | NASAL | 2 refills | Status: DC

## 2020-08-08 MED FILL — FLUTICASONE PROP 50 MCG SPR: 50 | 25 days supply | Qty: 16 | Fill #0

## 2020-08-08 MED FILL — LIDOCAINE 2% VISCOUS SOLN: 2 | 10 days supply | Qty: 100 | Fill #0

## 2020-08-08 MED FILL — CETIRIZINE HCL 10 MG TABS: 10 | 30 days supply | Qty: 30 | Fill #0

## 2020-08-08 NOTE — ED Provider Notes (Signed)
[facility]    CSN: [id number] Arrival date & time: 08/08/20  0947      History   Chief Complaint Chief Complaint  Patient presents with  . Otalgia  . Nasal Congestion    HPI Sarah Garrison is a 40 y.o. female.   Patient presenting today with 3-day history of bilateral ear pain and pressure, runny nose, sore swollen throat, hoarseness, cough.  Sarah Garrison denies fever, chills, chest pain, shortness of breath, wheezing, body aches, abdominal pain, nausea, vomiting, diarrhea.  Sarah Garrison does endorse a history of seasonal allergies, ran out of allergy medication several months ago and has intermittent flareups of the symptoms.  They feel consistent with this when it happens.  No new sick contacts, home Covid test negative.  States most recent Covid infection 6 weeks ago and is status post covid antibody infusion therapy.  History of insulin-dependent diabetes, notes on her Dexcom that her sugars have been in the mid to high 200s since onset of symptoms.     Past Medical History:  Diagnosis Date  . Asthma   . COVID-19   . Diabetes mellitus without complication (HCC)   . GERD (gastroesophageal reflux disease)   . Hypertension   . Seizures [facility])     Patient Active Problem List   Diagnosis Date Noted  . Hip pain 12/08/2019  . Hyperlipidemia associated with type 2 diabetes mellitus (HCC) 11/23/2019  . History of COVID-19 10/27/2019  . Stuttering 10/27/2019  . Depression with anxiety 10/27/2019  . Obesity, Class III, BMI 40-49.9 (morbid obesity) (HCC) 06/30/2019  . OSA (obstructive sleep apnea) 02/16/2019  . Essential hypertension 06/22/2018  . Uncontrolled type 2 diabetes mellitus with complication, with long-term current use of insulin (HCC) 12/13/2016  . Asthma 07/30/2016  . 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/07/2016  . DM neuropathy, type II diabetes mellitus (HCC) 08/24/2015    Past Surgical History:  Procedure Laterality Date  . CESAREAN SECTION    . HERNIA REPAIR    . TUBAL  LIGATION    . VENTRAL HERNIA REPAIR N/A 11/27/2017   Procedure: VENTRAL HERNIA REPAIR ERAS PATHWAY;  Surgeon: Harriette Bouillon, MD;  Location: [facility];  Service: General;  Laterality: N/A;  . WISDOM TOOTH EXTRACTION      OB History    Gravida  5   Para  4   Term      Preterm      AB  1   Living  4     SAB  1   IAB      Ectopic      Multiple      Live Births               Home Medications    Prior to Admission medications   Medication Sig Start Date End Date Taking? Authorizing Provider  albuterol (VENTOLIN HFA) 108 (90 Base) MCG/ACT inhaler Inhale 1-2 puffs into the lungs every 6 (six) hours as needed for wheezing or shortness of breath. 01/01/19  Yes Storm Frisk, MD  cetirizine (ZYRTEC ALLERGY) 10 MG tablet Take 1 tablet (10 mg total) by mouth daily. 08/08/20  Yes Particia Nearing, PA-C  Continuous Blood Gluc Receiver (DEXCOM G6 RECEIVER) DEVI 1 each by Misc.(Non-Drug; Combo Route) route daily. 02/29/20  Yes [provider]  fluticasone (FLONASE) 50 MCG/ACT nasal spray Place 2 sprays into both nostrils daily. 03/22/20  Yes Newlin, Odette Horns, MD  fluticasone (FLONASE) 50 MCG/ACT nasal spray Place 1 spray into both nostrils in  the morning and at bedtime. 08/08/20  Yes Particia Nearing, PA-C  Insulin Human (INSULIN PUMP) SOLN Inject 1 each into the skin See admin instructions. Medication: Novolog 100 units/ml injection. Takes 100 units via pump per patient.   Yes [provider]  lidocaine (XYLOCAINE) 2 % solution Use as directed 10 mLs in the mouth or throat as needed for mouth pain. 08/08/20  Yes Particia Nearing, PA-C  pantoprazole (PROTONIX) 40 MG tablet Take 1 tablet (40 mg total) by mouth daily. 03/22/20  Yes Hoy Register, MD  sertraline (ZOLOFT) 50 MG tablet Take 1 tablet (50 mg total) by mouth daily. 03/22/20  Yes Hoy Register, MD  atorvastatin (LIPITOR) 20 MG tablet Take 1 tablet (20 mg total) by mouth  daily. 11/23/19   Storm Frisk, MD  Continuous Blood Gluc Sensor (DEXCOM G6 SENSOR) MISC SMARTSIG:1 Topical Every 10 Days 12/03/19   [provider]  Continuous Blood Gluc Transmit (DEXCOM G6 TRANSMITTER) MISC USE TO CHECK BLOOD SUGAR EVERY 3 MONTHS 10/16/19   [provider]  fluticasone-salmeterol (ADVAIR HFA) 115-21 MCG/ACT inhaler Inhale 2 puffs into the lungs 2 (two) times daily. 11/23/19   Storm Frisk, MD  Fluticasone-Salmeterol (ADVAIR) 100-50 MCG/DOSE AEPB Inhale 1 puff into the lungs 2 (two) times daily.    [provider]  insulin aspart (NOVOLOG) 100 UNIT/ML injection USE VIA INSULIN PUMP. MAX TOTAL DAILY DOSE OF 200 UNITS 11/23/19   [provider]  insulin degludec (TRESIBA) 100 UNIT/ML FlexTouch Pen Inject into the skin. 01/08/20   [provider]  Insulin Disposable Pump (OMNIPOD DASH 5 PACK PODS) MISC Take by mouth every other day. 10/16/19   [provider]  Lactulose 20 GM/30ML SOLN Take 30 mLs (20 g total) by mouth at bedtime. For 3 nights then as needed for constipation 10/23/19   Fulp, Cammie, MD  lisinopril (ZESTRIL) 20 MG tablet Take 1 tablet by mouth daily. 08/05/20   [provider]  losartan (COZAAR) 50 MG tablet Take 1 tablet (50 mg total) by mouth daily. 11/23/19   Storm Frisk, MD  Misc. Devices MISC CPAP therapy on autopap 5-15.  Needs Small size [employer]kel Full Face Mask Simplus  mask and heated humidification. 09/13/19   Claiborne Rigg, NP  neomycin-polymyxin-hydrocortisone (CORTISPORIN) OTIC solution Apply 1-2 drops to toenail twice a day and cover with bandaid as needed 04/07/20   Asencion Islam, DPM  nystatin (MYCOSTATIN) 100000 UNIT/ML suspension Take 5 mLs by mouth 4 (four) times daily. 10/13/19   [provider]  sitaGLIPtin (JANUVIA) 100 MG tablet Take by mouth. 02/05/20   [provider]  TRUEPLUS PEN NEEDLES 32G X 4 MM MISC USE 3 TIMES DAILY AS DIRECTED 06/16/18   Hoy Register, MD  VITAMIN D PO Take 1 tablet by mouth 2 (two) times daily.    [provider]  ARIPiprazole (ABILIFY) 5 MG tablet Take 1 tablet (5 mg total) by mouth daily. 10/27/19 06/13/20  Sater, Pearletha Furl, MD    Family History Family History  Problem Relation Age of Onset  . Asthma Mother   . Kidney failure Mother   . Brain cancer Mother   . Asthma Father   . Other Father        surgery on stomach but don't know from what  . Diabetes Brother   . Diabetes Maternal Grandmother     Social History Social History   Tobacco Use  . Smoking status: Former Smoker    Packs/day:  0.10    Years: 2.00    Pack years: 0.20    Types: Cigarettes    Quit date: 11/27/2017    Years since quitting: 2.6  . Smokeless tobacco: Never Used  Vaping Use  . Vaping Use: Never used  Substance Use Topics  . Alcohol use: No  . Drug use: No     Allergies   Morphine and related, Glyburide, Ivp dye [iodinated diagnostic agents], Oxycodone, and Vicodin [hydrocodone-acetaminophen]   Review of Systems Review of Systems Per HPI  Physical Exam Triage Vital Signs ED Triage Vitals  Enc Vitals Group     BP 08/08/20 1107 (!) 138/94     Pulse Rate 08/08/20 1107 (!) 114     Resp 08/08/20 1107 20     Temp 08/08/20 1107 98.5 F (36.9 C)     Temp Source 08/08/20 1107 Oral     SpO2 08/08/20 1107 100 %     Weight 08/08/20 1105 260 lb (117.9 kg)     Height 08/08/20 1105 5\' 4"  (1.626 m)     Head Circumference --      Peak Flow --      Pain Score 08/08/20 1104 7     Pain Loc --      Pain Edu? --      Excl. in GC? --    No data found.  Updated Vital Signs BP (!) 138/94 (BP Location: Right Arm)   Pulse (!) 114   Temp 98.5 F (36.9 C) (Oral)   Resp 20   Ht 5\' 4"  (1.626 m)   Wt 260 lb (117.9 kg)   SpO2 100%   BMI 44.63 kg/m   Visual Acuity Right Eye Distance:   Left Eye Distance:   Bilateral Distance:    Right Eye Near:   Left Eye Near:    Bilateral Near:     Physical Exam Vitals  and nursing note reviewed.  Constitutional:      Appearance: Normal appearance. Sarah Garrison is not ill-appearing.  HENT:     Head: Atraumatic.     Ears:     Comments: Bilateral middle ear effusions    Nose: Rhinorrhea present.     Comments: Bilateral nasal turbinates significantly boggy, erythematous    Mouth/Throat:     Mouth: Mucous membranes are moist.     Pharynx: Oropharynx is clear. Posterior oropharyngeal erythema present. No oropharyngeal exudate.     Comments: Bilateral tonsillar edema, no exudates Eyes:     Extraocular Movements: Extraocular movements intact.     Conjunctiva/sclera: Conjunctivae normal.  Cardiovascular:     Rate and Rhythm: Normal rate and regular rhythm.     Heart sounds: Normal heart sounds.  Pulmonary:     Effort: Pulmonary effort is normal.     Breath sounds: Normal breath sounds.  Abdominal:     General: Bowel sounds are normal. There is no distension.     Palpations: Abdomen is soft.     Tenderness: There is no abdominal tenderness. There is no guarding.  Musculoskeletal:        General: Normal range of motion.     Cervical back: Normal range of motion and neck supple.  Skin:    General: Skin is warm and dry.  Neurological:     Mental Status: Sarah Garrison is alert and oriented to person, place, and time.  Psychiatric:        Mood and Affect: Mood normal.        Thought Content: Thought content normal.  Judgment: Judgment normal.      UC Treatments / Results  Labs (all labs ordered are listed, but only abnormal results are displayed) Labs Reviewed - No data to display  EKG   Radiology No results found.  Procedures Procedures (including critical care time)  Medications Ordered in UC Medications - No data to display  Initial Impression / Assessment and Plan / UC Course  I have reviewed the triage vital signs and the nursing notes.  Pertinent labs & imaging results that were available during my care of the patient were reviewed by me and  considered in my medical decision making (see chart for details).     Viral versus allergic-we will start Zyrtec and twice daily Flonase regimen ongoing, Coricidin HBP for congestion, viscous lidocaine for sore throat as needed.  Declines Covid retest today given recent illness and home Covid test negative, will quarantine until symptoms improve.  Return for acutely worsening symptoms.  Final Clinical Impressions(s) / UC Diagnoses   Final diagnoses:  Viral URI with cough  Non-seasonal allergic rhinitis due to other allergic trigger   Discharge Instructions   None    ED Prescriptions    Medication Sig Dispense Auth. Provider   cetirizine (ZYRTEC ALLERGY) 10 MG tablet Take 1 tablet (10 mg total) by mouth daily. 30 tablet Particia NearingLane, Pierce Barocio Elizabeth, PA-C   fluticasone [facility](FLONASE) 50 MCG/ACT nasal spray Place 1 spray into both nostrils in the morning and at bedtime. 16 g Particia NearingLane, Jerricka Carvey Elizabeth, PA-C   lidocaine (XYLOCAINE) 2 % solution Use as directed 10 mLs in the mouth or throat as needed for mouth pain. 100 mL Particia NearingLane, Earvin Blazier Elizabeth, [place]-C     PDMP not reviewed this encounter.   Particia NearingLane, Jaquelynn Wanamaker Elizabeth, [place] 08/08/20 1207

## 2020-08-08 NOTE — ED Triage Notes (Signed)
Pt c/o 'allergies" with post-nasal drip, sore throat that pt attributes to having to clear secretions repeatedly, cough, hoarseness onset Friday. Pt c/o bilateral ear pain onset yesterday.   Denies fever, n/v/d, body aches.   Took mucinex this morning. Had COVID illness in January.  Negative covid home tests last week States her glucose has been trending "high" 200 at present. Pt has insulin pump.

## 2020-08-11 ENCOUNTER — Other Ambulatory Visit: Payer: Self-pay

## 2020-08-11 ENCOUNTER — Other Ambulatory Visit: Payer: Self-pay | Admitting: Physician Assistant

## 2020-08-11 ENCOUNTER — Ambulatory Visit: Payer: No Typology Code available for payment source | Attending: Physician Assistant | Admitting: Physician Assistant

## 2020-08-11 DIAGNOSIS — E1165 Type 2 diabetes mellitus with hyperglycemia: Secondary | ICD-10-CM | POA: Diagnosis not present

## 2020-08-11 DIAGNOSIS — G5603 Carpal tunnel syndrome, bilateral upper limbs: Secondary | ICD-10-CM | POA: Diagnosis not present

## 2020-08-11 DIAGNOSIS — F418 Other specified anxiety disorders: Secondary | ICD-10-CM | POA: Diagnosis not present

## 2020-08-11 DIAGNOSIS — IMO0002 Reserved for concepts with insufficient information to code with codable children: Secondary | ICD-10-CM

## 2020-08-11 DIAGNOSIS — E118 Type 2 diabetes mellitus with unspecified complications: Secondary | ICD-10-CM

## 2020-08-11 DIAGNOSIS — Z794 Long term (current) use of insulin: Secondary | ICD-10-CM

## 2020-08-11 MED ORDER — SERTRALINE HCL 100 MG PO TABS: 100.0000 mg | ORAL_TABLET | Freq: Every day | ORAL | 3 refills | Status: DC

## 2020-08-11 MED FILL — SERTRALINE HCL 100 MG TAB: 100 | 30 days supply | Qty: 30 | Fill #0

## 2020-08-11 NOTE — Progress Notes (Signed)
Patient ID: Sarah Garrison, female   DOB: 1981/01/07, 40 y.o.   MRN: [id number] Virtual Visit via Telephone Note  I connected with Sarah Garrison on 08/11/20 at 11:10 AM EST by telephone and verified that I am speaking with the correct person using two identifiers.  Location: Patient: home Provider: [facility] office Screened by Maree Erie   I discussed the limitations, risks, security and privacy concerns of performing an evaluation and management service by telephone and the availability of in person appointments. I also discussed with the patient that there may be a patient responsible charge related to this service. The patient expressed understanding and agreed to proceed.   History of Present Illness:  Patient c/o B hand pain and tingling that is worse at night and awakens her from sleep at times.  Pain is worse at night and in the mornings.  This has been going on for a while.    Also having some breakthrough anxiety and depression.  No SI/HI.  Compliant on zoloft.  Wants to see about getting her dog registered as a therapy animal.    Saw endocrinologist Friday.      Observations/Objective: NAD.  A&Ox3  Assessment and Plan: 1. Bilateral carpal tunnel syndrome -obtain wrist splints and sleep in them - Ambulatory referral to Hand Surgery  2. Depression with anxiety Will increase dose of zoloft and have her f/up with social worker - sertraline (ZOLOFT) 100 MG tablet; Take 1 tablet (100 mg total) by mouth daily.  Dispense: 30 tablet; Refill: 3 - Ambulatory referral to Integrated Behavioral Health  3. Uncontrolled type 2 diabetes mellitus with complication, with long-term current use of insulin (HCC) Keep all f/up with endocrinology   Follow Up Instructions: See PCP in 3 months for chronic conditions   I discussed the assessment and treatment plan with the patient. The patient was provided an opportunity to ask questions and all were answered. The patient agreed with the  plan and demonstrated an understanding of the instructions.   The patient was advised to call back or seek an in-person evaluation if the symptoms worsen or if the condition fails to improve as anticipated.  I provided 16 minutes of non-face-to-face time during this encounter.   Georgian Co, PA-C

## 2020-08-11 NOTE — Patient Instructions (Addendum)
Wrist splints-wear   Carpal Tunnel Syndrome  Carpal tunnel syndrome is a condition that causes pain, weakness, and numbness in your hand and arm. Numbness is when you cannot feel an area in your body. The carpal tunnel is a narrow area that is on the palm side of your wrist. Repeated wrist motion or certain diseases may cause swelling in the tunnel. This swelling can pinch the main nerve in the wrist. This nerve is called the median nerve. What are the causes? This condition may be caused by:  Moving your hand and wrist over and over again while doing a task.  Injury to the wrist.  Arthritis.  A sac of fluid (cyst) or abnormal growth (tumor) in the carpal tunnel.  Fluid buildup during pregnancy.  Use of tools that vibrate. Sometimes the cause is not known. What increases the risk? The following factors may make you more likely to have this condition:  Having a job that makes you do these things: ? Move your hand over and over again. ? Work with tools that vibrate, such as drills or sanders.  Being a woman.  Having diabetes, obesity, thyroid problems, or kidney failure. What are the signs or symptoms? Symptoms of this condition include:  A tingling feeling in your fingers.  Tingling or loss of feeling in your hand.  Pain in your entire arm. This pain may get worse when you bend your wrist and elbow for a long time.  Pain in your wrist that goes up your arm to your shoulder.  Pain that goes down into your palm or fingers.  Weakness in your hands. You may find it hard to grab and hold items. You may feel worse at night. How is this treated? This condition may be treated with:  Lifestyle changes. You will be asked to stop or change the activity that caused your problem.  Doing exercises and activities that make bones, muscles, and tendons stronger (physical therapy).  Learning how to use your hand again (occupational therapy).  Medicines for pain and swelling. You  may have injections in your wrist.  A wrist splint or brace.  Surgery. Follow these instructions at home: If you have a splint or brace:  Wear the splint or brace as told by your doctor. Take it off only as told by your doctor.  Loosen the splint if your fingers: ? Tingle. ? Become numb. ? Turn cold and blue.  Keep the splint or brace clean.  If the splint or brace is not waterproof: ? Do not let it get wet. ? Cover it with a watertight covering when you take a bath or a shower. Managing pain, stiffness, and swelling If told, put ice on the painful area:  If you have a removable splint or brace, remove it as told by your doctor.  Put ice in a plastic bag.  Place a towel between your skin and the bag.  Leave the ice on for 20 minutes, 2-3 times per day. Do not fall asleep with the cold pack on your skin.  Take off the ice if your skin turns bright red. This is very important. If you cannot feel pain, heat, or cold, you have a greater risk of damage to the area. Move your fingers often to reduce stiffness and swelling.   General instructions  Take over-the-counter and prescription medicines only as told by your doctor.  Rest your wrist from any activity that may cause pain. If needed, talk with your boss at work  about changes that can help your wrist heal.  Do exercises as told by your doctor, physical therapist, or occupational therapist.  Keep all follow-up visits. Contact a doctor if:  You have new symptoms.  Medicine does not help your pain.  Your symptoms get worse. Get help right away if:  You have very bad numbness or tingling in your wrist or hand. Summary  Carpal tunnel syndrome is a condition that causes pain in your hand and arm.  It is often caused by repeated wrist motions.  Lifestyle changes and medicines are used to treat this problem. Surgery may help in very bad cases.  Follow your doctor's instructions about wearing a splint, resting your  wrist, keeping follow-up visits, and calling for help. This information is not intended to replace advice given to you by your health care provider. Make sure you discuss any questions you have with your health care provider. Document Revised: 10/01/2019 Document Reviewed: 10/01/2019 Elsevier Patient Education  2021 [employer].

## 2020-08-15 ENCOUNTER — Other Ambulatory Visit: Payer: Self-pay

## 2020-08-15 ENCOUNTER — Encounter: Payer: Self-pay | Admitting: Physician Assistant

## 2020-08-15 ENCOUNTER — Ambulatory Visit (INDEPENDENT_AMBULATORY_CARE_PROVIDER_SITE_OTHER): Payer: Medicaid Other | Admitting: Orthopaedic Surgery

## 2020-08-15 DIAGNOSIS — R202 Paresthesia of skin: Secondary | ICD-10-CM

## 2020-08-15 NOTE — Progress Notes (Signed)
Office Visit Note   Patient: Sarah Garrison           Date of Birth: August 30, 1980           MRN: [id number] Visit Date: 08/15/2020              Requested by: Anders Simmonds, PA-C [address] [place],  [place] [address] PCP: Pcp, No   Assessment & Plan: Visit Diagnoses:  1. Paresthesias     Plan: Impression is bilateral hand carpal tunnel syndrome.  We will provide her with a removable wrist splints to wear at night and to go ahead and obtain nerve conduction study/EMG bilateral upper extremity.  Follow-up with [place] once has been completed.  Follow-Up Instructions: Return for after BUE ncs/emg.   Orders:  No orders of the defined types were placed in this encounter.  No orders of the defined types were placed in this encounter.     Procedures: No procedures performed   Clinical Data: No additional findings.   Subjective: Chief Complaint  Patient presents with  . Right Hand - Pain  . Left Hand - Pain    HPI patient is a pleasant 40 year old right-hand-dominant female who comes in today with bilateral hand pain and paresthesias left greater than right for the past few months.  Her symptoms have recently worsened.  No known injury.  She does not have a job where she is constantly typing or writing.  Pain is worse at night in the morning where she is frequently having to shake her hands.  The paresthesias occur throughout the median nerve distribution.  No previous nerve conduction study.  Review of Systems as detailed in HPI.  All others reviewed and are negative.  Objective: Vital Signs: There were no vitals taken for this visit.  Physical Exam well-developed well-nourished female no acute distress.  Alert oriented x3.  Ortho Exam left hand exam shows a positive Phalen and Tinel.  No thenar atrophy.  Right hand exam shows a positive Phalen and positive Tinel.  No thenar atrophy.  She is neurovascular intact distally.  Specialty Comments:  No specialty  comments available.  Imaging: No new imaging   PMFS History: Patient Active Problem List   Diagnosis Date Noted  . Hip pain 12/08/2019  . Hyperlipidemia associated with type 2 diabetes mellitus (HCC) 11/23/2019  . History of COVID-19 10/27/2019  . Stuttering 10/27/2019  . Depression with anxiety 10/27/2019  . Obesity, Class III, BMI 40-49.9 (morbid obesity) (HCC) 06/30/2019  . OSA (obstructive sleep apnea) 02/16/2019  . Essential hypertension 06/22/2018  . Uncontrolled type 2 diabetes mellitus with complication, with long-term current use of insulin (HCC) 12/13/2016  . Asthma 07/30/2016  . 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/07/2016  . DM neuropathy, type II diabetes mellitus (HCC) 08/24/2015   Past Medical History:  Diagnosis Date  . Asthma   . COVID-19   . Diabetes mellitus without complication (HCC)   . GERD (gastroesophageal reflux disease)   . Hypertension   . Seizures (HCC)     Family History  Problem Relation Age of Onset  . Asthma Mother   . Kidney failure Mother   . Brain cancer Mother   . Asthma Father   . Other Father        surgery on stomach but don't know from what  . Diabetes Brother   . Diabetes Maternal Grandmother     Past Surgical History:  Procedure Laterality Date  . CESAREAN SECTION    . HERNIA  REPAIR    . TUBAL LIGATION    . VENTRAL HERNIA REPAIR N/A 11/27/2017   Procedure: VENTRAL HERNIA REPAIR ERAS PATHWAY;  Surgeon: Harriette Bouillon, MD;  Location: [facility];  Service: General;  Laterality: N/A;  . WISDOM TOOTH EXTRACTION     Social History   Occupational History  . Not on file  Tobacco Use  . Smoking status: Former Smoker    Packs/day: 0.10    Years: 2.00    Pack years: 0.20    Types: Cigarettes    Quit date: 11/27/2017    Years since quitting: 2.7  . Smokeless tobacco: Never Used  Vaping Use  . Vaping Use: Never used  Substance and Sexual Activity  . Alcohol use: No  . Drug use: No  . Sexual activity: Yes    Birth  control/protection: Surgical

## 2020-08-22 ENCOUNTER — Telehealth: Payer: Self-pay | Admitting: Physical Medicine and Rehabilitation

## 2020-08-22 MED FILL — SERTRALINE HCL 100 MG TAB: 100 | 30 days supply | Qty: 30 | Fill #0

## 2020-08-22 MED FILL — NovoLOG 100 UNIT/ML SOLN: 100 | 32 days supply | Qty: 80 | Fill #0

## 2020-08-22 NOTE — Telephone Encounter (Signed)
Pt called stating she missed a call from [facility] and would like to be tried again.   [phone]

## 2020-08-31 ENCOUNTER — Telehealth: Payer: Self-pay

## 2020-08-31 NOTE — Telephone Encounter (Signed)
Copied from CRM [id number]. Topic: General - Other >> Aug 30, 2020  3:30 PM Gwenlyn Fudge wrote: Reason for CRM: Pt calling and is requesting to speak with [facility]. Pt would not say why. Please advise.

## 2020-09-01 NOTE — Telephone Encounter (Signed)
Reached out to the patient by phone. LVM for patient to return a phone call back to the office for behavioral health resources.

## 2020-09-01 NOTE — Telephone Encounter (Signed)
Pt is aware Sarah Garrison no longer at [facility] and she would like to ask Sarah Garrison who will she be talking with concerning her anxiety

## 2020-09-06 NOTE — Telephone Encounter (Signed)
Pt wanted a call back regarding her depression and possible resources, please advise.

## 2020-09-08 ENCOUNTER — Telehealth: Payer: Self-pay

## 2020-09-08 NOTE — Telephone Encounter (Signed)
Followed up with patient on 4/7. Patient is asking for a letter to provide to her new landlord with for an emotional support animal.The letter would be for her pet deposit to be waived.

## 2020-09-08 NOTE — Telephone Encounter (Signed)
I did speak with Jasmine earlier concerning the emotional support pet. She shared,"In most cases, the patient would need to establish with an outpatient counselor who will write the official letter after a thorough evaluation of their mental health needs. Usually after more than one session."

## 2020-09-08 NOTE — Telephone Encounter (Signed)
Following up on the phone call from the patient regarding mental heath and community resources. Patient verified last name and DOB for verification. Patient stated she is moving to a new home but request a letter from LCSW or MD supporting her need for a support animal and waive the pet deposit for her. Patient mentioned arriving at the clinic to retrieve a letter from the outgoing LCSW concerning her pet, she was notified the provider is no longer at [facility]. Patient expressed moderate transition (moving, financial concerns with utility bills and employment) in her life and her depression at times is heightened and she did acknowledge considering going to [facility] but the time for arrival is too early for her. Care Coordinator concluded the phone call sharing with the patient a new LCSW would be starting in Mid May, to follow through with using [facility] as a resource if needed. Care Coordinator discussed reached out the PCP,seeing if a letter can be written for en emotional support animal.  

## 2020-09-08 NOTE — Telephone Encounter (Signed)
Following up on the phone call from the patient regarding mental heath and community resources. Patient verified last name and DOB for verification. Patient stated she is moving to a new home but request a letter from LCSW or MD supporting her need for a support animal and waive the pet deposit for her. Patient mentioned arriving at the clinic to retrieve a letter from the outgoing LCSW concerning her pet, she was notified the provider is no longer at [facility]. Patient expressed moderate transition (moving, financial concerns with utility bills and employment) in her life and her depression at times is heightened and she did acknowledge considering going to [facility] but the time for arrival is too early for her. Care Coordinator concluded the phone call sharing with the patient a new LCSW would be starting in Mid May, to follow through with using [facility] as a resource if needed. Care Coordinator discussed reached out the PCP,seeing if a letter can be written for en emotional support animal.

## 2020-09-08 NOTE — Telephone Encounter (Signed)
She is aware. She shared [facility] encourage that she do before but has not actively looked.

## 2020-09-15 ENCOUNTER — Other Ambulatory Visit: Payer: Self-pay

## 2020-09-26 ENCOUNTER — Other Ambulatory Visit: Payer: Self-pay

## 2020-09-26 MED ORDER — INSULIN ASPART 100 UNIT/ML ~~LOC~~ SOLN
SUBCUTANEOUS | 5 refills | Status: DC
  Filled 2020-09-26: qty 10, 3d supply, fill #0
  Filled 2020-09-26: qty 80, 27d supply, fill #0
  Filled 2020-09-26: qty 90, 30d supply, fill #0
  Filled 2020-09-26: qty 10, 3d supply, fill #0

## 2020-09-26 MED ORDER — "INSULIN SYRINGE-NEEDLE U-100 30G X 5/16"" 0.3 ML MISC": 0 refills | Status: DC

## 2020-09-26 MED ORDER — "INSULIN SYRINGE-NEEDLE U-100 31G X 5/16"" 1 ML MISC"
2 refills | Status: AC
  Filled 2020-09-26: qty 100, 30d supply, fill #0

## 2020-09-26 MED ORDER — INSULIN ASPART 100 UNIT/ML IJ SOLN
INTRAMUSCULAR | 0 refills | Status: DC
  Filled 2020-09-26: qty 80, 26d supply, fill #0

## 2020-09-26 MED ORDER — TRESIBA FLEXTOUCH 100 UNIT/ML ~~LOC~~ SOPN: PEN_INJECTOR | SUBCUTANEOUS | 2 refills | Status: DC

## 2020-09-27 ENCOUNTER — Other Ambulatory Visit: Payer: Self-pay

## 2020-09-29 ENCOUNTER — Other Ambulatory Visit: Payer: Self-pay

## 2020-09-29 MED ORDER — INSULIN ASPART 100 UNIT/ML IJ SOLN
INTRAMUSCULAR | 0 refills | Status: DC
  Filled 2020-09-29 – 2020-10-14 (×2): qty 80, 26d supply, fill #0
  Filled 2020-11-17: qty 80, 26d supply, fill #1
  Filled 2020-12-30: qty 20, 6d supply, fill #2
  Filled 2020-12-30: qty 60, 20d supply, fill #2

## 2020-09-30 ENCOUNTER — Other Ambulatory Visit: Payer: Self-pay

## 2020-09-30 ENCOUNTER — Ambulatory Visit (INDEPENDENT_AMBULATORY_CARE_PROVIDER_SITE_OTHER): Payer: Medicaid Other | Admitting: Physical Medicine and Rehabilitation

## 2020-09-30 ENCOUNTER — Encounter: Payer: Self-pay | Admitting: Physical Medicine and Rehabilitation

## 2020-09-30 DIAGNOSIS — R202 Paresthesia of skin: Secondary | ICD-10-CM | POA: Diagnosis not present

## 2020-09-30 NOTE — Progress Notes (Signed)
Pt state both hand, wrist and fingers pain. Pt state she has numbness and tingling in both hands. Pt state everything makes the pain worse. Pt statr right pointer finger has tingling. Pt state she is right handed.   Numeric Pain Rating Scale and Functional Assessment Average Pain 3   In the last MONTH (on 0-10 scale) has pain interfered with the following?  1. General activity like being  able to carry out your everyday physical activities such as walking, climbing stairs, carrying groceries, or moving a chair?  Rating(4)

## 2020-09-30 NOTE — Procedures (Signed)
EMG & NCV Findings: Evaluation of the left median (across palm) sensory and the right median (across palm) sensory nerves showed prolonged distal peak latency (Wrist, L4.1, R3.8 ms) and prolonged distal peak latency (Palm, L3.6, R3.2 ms).  All remaining nerves (as indicated in the following tables) were within normal limits.  Left vs. Right side comparison data for the ulnar motor nerve indicates abnormal L-R velocity difference (A Elbow-B Elbow, 20 m/s).  All remaining left vs. right side differences were within normal limits.    All examined muscles (as indicated in the following table) showed no evidence of electrical instability.    Impression: The above electrodiagnostic study is ABNORMAL and reveals evidence of a mild bilateral median nerve entrapment at the wrist (carpal tunnel syndrome) affecting sensory components.   There is no significant electrodiagnostic evidence of any other focal nerve entrapment, brachial plexopathy, cervical radiculopathy or generalized peripheral neuropathy in either upper limb.  Recommendations: 1.  Follow-up with referring physician. 2.  Continue current management of symptoms. 3.  Continue use of resting splint at night-time and as needed during the day.  ___________________________ Elease Hashimoto Board Certified, American Board of Physical Medicine and Rehabilitation    Nerve Conduction Studies Anti Sensory Summary Table   Stim Site NR Peak (ms) Norm Peak (ms) P-T Amp (V) Norm P-T Amp Site1 Site2 Delta-P (ms) Dist (cm) Vel (m/s) Norm Vel (m/s)  Left Median Acr Palm Anti Sensory (2nd Digit)  32.6C  Wrist    *4.1 <3.6 18.0 >10 Wrist Palm 0.5 0.0    Palm    *3.6 <2.0 2.5         Right Median Acr Palm Anti Sensory (2nd Digit)  33.2C  Wrist    *3.8 <3.6 14.8 >10 Wrist Palm 0.6 0.0    Palm    *3.2 <2.0 1.8         Left Radial Anti Sensory (Base 1st Digit)  34.8C  Wrist    2.0 <3.1 6.0  Wrist Base 1st Digit 2.0 0.0    Right Radial Anti Sensory  (Base 1st Digit)  36.3C  Wrist    2.0 <3.1 27.3  Wrist Base 1st Digit 2.0 0.0    Left Ulnar Anti Sensory (5th Digit)  33.5C  Wrist    3.1 <3.7 24.5 >15.0 Wrist 5th Digit 3.1 14.0 45 >38  Right Ulnar Anti Sensory (5th Digit)  34.6C  Wrist    3.2 <3.7 20.5 >15.0 Wrist 5th Digit 3.2 14.0 44 >38   Motor Summary Table   Stim Site NR Onset (ms) Norm Onset (ms) O-P Amp (mV) Norm O-P Amp Site1 Site2 Delta-0 (ms) Dist (cm) Vel (m/s) Norm Vel (m/s)  Left Median Motor (Abd Poll Brev)  34.7C  Wrist    4.1 <4.2 5.2 >5 Elbow Wrist 4.1 21.0 51 >50  Elbow    8.2  8.2         Right Median Motor (Abd Poll Brev)  36.2C  Wrist    3.8 <4.2 8.8 >5 Elbow Wrist 4.3 21.5 50 >50  Elbow    8.1  8.8         Left Ulnar Motor (Abd Dig Min)  34C  Wrist    3.0 <4.2 7.2 >3 B Elbow Wrist 4.0 21.0 53 >53  B Elbow    7.0  8.0  A Elbow B Elbow 1.4 10.0 71 >53  A Elbow    8.4  8.5         Right Ulnar Motor (  Abd Dig Min)  35C  Wrist    3.0 <4.2 8.6 >3 B Elbow Wrist 4.0 22.0 55 >53  B Elbow    7.0  7.3  A Elbow B Elbow 1.1 10.0 91 >53  A Elbow    8.1  7.5          EMG   Side Muscle Nerve Root Ins Act Fibs Psw Amp Dur Poly Recrt Int Dennie Bible Comment  Left Abd Poll Brev Median C8-T1 Nml Nml Nml Nml Nml 0 Nml Nml   Left 1stDorInt Ulnar C8-T1 Nml Nml Nml Nml Nml 0 Nml Nml     Nerve Conduction Studies Anti Sensory Left/Right Comparison   Stim Site L Lat (ms) R Lat (ms) L-R Lat (ms) L Amp (V) R Amp (V) L-R Amp (%) Site1 Site2 L Vel (m/s) R Vel (m/s) L-R Vel (m/s)  Median Acr Palm Anti Sensory (2nd Digit)  32.6C  Wrist *4.1 *3.8 0.3 18.0 14.8 17.8 Wrist Palm     Palm *3.6 *3.2 0.4 2.5 1.8 28.0       Radial Anti Sensory (Base 1st Digit)  34.8C  Wrist 2.0 2.0 0.0 6.0 27.3 78.0 Wrist Base 1st Digit     Ulnar Anti Sensory (5th Digit)  33.5C  Wrist 3.1 3.2 0.1 24.5 20.5 16.3 Wrist 5th Digit 45 44 1   Motor Left/Right Comparison   Stim Site L Lat (ms) R Lat (ms) L-R Lat (ms) L Amp (mV) R Amp (mV) L-R Amp (%) Site1  Site2 L Vel (m/s) R Vel (m/s) L-R Vel (m/s)  Median Motor (Abd Poll Brev)  34.7C  Wrist 4.1 3.8 0.3 5.2 8.8 40.9 Elbow Wrist 51 50 1  Elbow 8.2 8.1 0.1 8.2 8.8 6.8       Ulnar Motor (Abd Dig Min)  34C  Wrist 3.0 3.0 0.0 7.2 8.6 16.3 B Elbow Wrist 53 55 2  B Elbow 7.0 7.0 0.0 8.0 7.3 8.8 A Elbow B Elbow 71 91 *20  A Elbow 8.4 8.1 0.3 8.5 7.5 11.8          Waveforms:

## 2020-09-30 NOTE — Progress Notes (Signed)
Sarah Garrison - 40 y.o. female MRN [id number]  Date of birth: 1980/08/02  Office Visit Note: Visit Date: 09/30/2020 PCP: Pcp, No Referred by: Cristie Hem, PA-C  Subjective: Chief Complaint  Patient presents with  . Left Hand - Pain, Numbness, Tingling  . Right Hand - Pain, Numbness, Tingling  . Left Wrist - Pain, Numbness, Tingling  . Right Wrist - Pain, Numbness, Tingling   HPI:  Sarah Garrison is a 40 y.o. female who comes in today at the request of Dr. Glee Arvin for electrodiagnostic study of the Bilateral upper extremities.  Patient is Right hand dominant.  She describes average 3 out of 10 hand pain with pain in the wrist and fingers.  She does report some numbness and tingling in both hands but is nondermatomal and global.  She reports that everything makes her pain worse.  She does get some increased tingling sensation in the right index finger.  She has had no prior electrodiagnostic studies.  Her case is complicated by uncontrolled type 2 diabetes on insulin.  Last A1c was 8.1 in February.  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  a1c 8.1 in feb  ROS Otherwise per HPI.  Assessment & Plan: Visit Diagnoses:    ICD-10-CM   1. Paresthesia of skin  R20.2 NCV with EMG (electromyography)    Plan: Impression: The above electrodiagnostic study is ABNORMAL and reveals evidence of a mild bilateral median nerve entrapment at the wrist (carpal tunnel syndrome) affecting sensory components.   There is no significant electrodiagnostic evidence of any other focal nerve entrapment, brachial plexopathy, cervical radiculopathy or generalized peripheral neuropathy in either upper limb.  Recommendations: 1.  Follow-up with referring physician. 2.  Continue current management of symptoms. 3.  Continue use of resting splint at night-time and as needed during the day.  Meds & Orders: No orders of the defined types were placed in this encounter.   Orders Placed  This Encounter  Procedures  . NCV with EMG (electromyography)    Follow-up: Return in about 2 weeks (around 10/14/2020) for Glee Arvin, MD.   Procedures: No procedures performed  EMG & NCV Findings: Evaluation of the left median (across palm) sensory and the right median (across palm) sensory nerves showed prolonged distal peak latency (Wrist, L4.1, R3.8 ms) and prolonged distal peak latency (Palm, L3.6, R3.2 ms).  All remaining nerves (as indicated in the following tables) were within normal limits.  Left vs. Right side comparison data for the ulnar motor nerve indicates abnormal L-R velocity difference (A Elbow-B Elbow, 20 m/s).  All remaining left vs. right side differences were within normal limits.    All examined muscles (as indicated in the following table) showed no evidence of electrical instability.    Impression: The above electrodiagnostic study is ABNORMAL and reveals evidence of a mild bilateral median nerve entrapment at the wrist (carpal tunnel syndrome) affecting sensory components.   There is no significant electrodiagnostic evidence of any other focal nerve entrapment, brachial plexopathy, cervical radiculopathy or generalized peripheral neuropathy in either upper limb.  Recommendations: 1.  Follow-up with referring physician. 2.  Continue current management of symptoms. 3.  Continue use of resting splint at night-time and as needed during the day.  ___________________________ Elease Hashimoto Board Certified, American Board of Physical Medicine and Rehabilitation    Nerve Conduction Studies Anti Sensory Summary Table   Stim Site NR Peak (ms) Norm Peak (ms) P-T Amp (V) Norm P-T Amp Site1  Site2 Delta-P (ms) Dist (cm) Vel (m/s) Norm Vel (m/s)  Left Median Acr Palm Anti Sensory (2nd Digit)  32.6C  Wrist    *4.1 <3.6 18.0 >10 Wrist Palm 0.5 0.0    Palm    *3.6 <2.0 2.5         Right Median Acr Palm Anti Sensory (2nd Digit)  33.2C  Wrist    *3.8 <3.6 14.8 >10  Wrist Palm 0.6 0.0    Palm    *3.2 <2.0 1.8         Left Radial Anti Sensory (Base 1st Digit)  34.8C  Wrist    2.0 <3.1 6.0  Wrist Base 1st Digit 2.0 0.0    Right Radial Anti Sensory (Base 1st Digit)  36.3C  Wrist    2.0 <3.1 27.3  Wrist Base 1st Digit 2.0 0.0    Left Ulnar Anti Sensory (5th Digit)  33.5C  Wrist    3.1 <3.7 24.5 >15.0 Wrist 5th Digit 3.1 14.0 45 >38  Right Ulnar Anti Sensory (5th Digit)  34.6C  Wrist    3.2 <3.7 20.5 >15.0 Wrist 5th Digit 3.2 14.0 44 >38   Motor Summary Table   Stim Site NR Onset (ms) Norm Onset (ms) O-P Amp (mV) Norm O-P Amp Site1 Site2 Delta-0 (ms) Dist (cm) Vel (m/s) Norm Vel (m/s)  Left Median Motor (Abd Poll Brev)  34.7C  Wrist    4.1 <4.2 5.2 >5 Elbow Wrist 4.1 21.0 51 >50  Elbow    8.2  8.2         Right Median Motor (Abd Poll Brev)  36.2C  Wrist    3.8 <4.2 8.8 >5 Elbow Wrist 4.3 21.5 50 >50  Elbow    8.1  8.8         Left Ulnar Motor (Abd Dig Min)  34C  Wrist    3.0 <4.2 7.2 >3 B Elbow Wrist 4.0 21.0 53 >53  B Elbow    7.0  8.0  A Elbow B Elbow 1.4 10.0 71 >53  A Elbow    8.4  8.5         Right Ulnar Motor (Abd Dig Min)  35C  Wrist    3.0 <4.2 8.6 >3 B Elbow Wrist 4.0 22.0 55 >53  B Elbow    7.0  7.3  A Elbow B Elbow 1.1 10.0 91 >53  A Elbow    8.1  7.5          EMG   Side Muscle Nerve Root Ins Act Fibs Psw Amp Dur Poly Recrt Int Dennie Bible Comment  Left Abd Poll Brev Median C8-T1 Nml Nml Nml Nml Nml 0 Nml Nml   Left 1stDorInt Ulnar C8-T1 Nml Nml Nml Nml Nml 0 Nml Nml     Nerve Conduction Studies Anti Sensory Left/Right Comparison   Stim Site L Lat (ms) R Lat (ms) L-R Lat (ms) L Amp (V) R Amp (V) L-R Amp (%) Site1 Site2 L Vel (m/s) R Vel (m/s) L-R Vel (m/s)  Median Acr Palm Anti Sensory (2nd Digit)  32.6C  Wrist *4.1 *3.8 0.3 18.0 14.8 17.8 Wrist Palm     Palm *3.6 *3.2 0.4 2.5 1.8 28.0       Radial Anti Sensory (Base 1st Digit)  34.8C  Wrist 2.0 2.0 0.0 6.0 27.3 78.0 Wrist Base 1st Digit     Ulnar Anti Sensory (5th Digit)   33.5C  Wrist 3.1 3.2 0.1 24.5 20.5 16.3 Wrist 5th Digit 45 44  1   Motor Left/Right Comparison   Stim Site L Lat (ms) R Lat (ms) L-R Lat (ms) L Amp (mV) R Amp (mV) L-R Amp (%) Site1 Site2 L Vel (m/s) R Vel (m/s) L-R Vel (m/s)  Median Motor (Abd Poll Brev)  34.7C  Wrist 4.1 3.8 0.3 5.2 8.8 40.9 Elbow Wrist 51 50 1  Elbow 8.2 8.1 0.1 8.2 8.8 6.8       Ulnar Motor (Abd Dig Min)  34C  Wrist 3.0 3.0 0.0 7.2 8.6 16.3 B Elbow Wrist 53 55 2  B Elbow 7.0 7.0 0.0 8.0 7.3 8.8 A Elbow B Elbow 71 91 *20  A Elbow 8.4 8.1 0.3 8.5 7.5 11.8          Waveforms:                      Clinical History: No specialty comments available.     Objective:  VS:  HT:    WT:   BMI:     BP:   HR: bpm  TEMP: ( )  RESP:  Physical Exam Constitutional:      Appearance: She is obese.  Musculoskeletal:        General: No swelling, tenderness or deformity.     Comments: Inspection reveals no atrophy of the bilateral APB or FDI or hand intrinsics. There is no swelling, color changes, allodynia or dystrophic changes. There is 5 out of 5 strength in the bilateral wrist extension, finger abduction and long finger flexion. There is intact sensation to light touch in all dermatomal and peripheral nerve distributions.  There is a negative Phalen's test bilaterally. There is a negative Hoffmann's test bilaterally.  Skin:    General: Skin is warm and dry.     Findings: No erythema or rash.  Neurological:     General: No focal deficit present.     Mental Status: She is alert and oriented to person, place, and time.     Motor: No weakness or abnormal muscle tone.     Coordination: Coordination normal.  Psychiatric:        Mood and Affect: Mood normal.        Behavior: Behavior normal.      Imaging: No results found.

## 2020-10-03 ENCOUNTER — Other Ambulatory Visit: Payer: Self-pay

## 2020-10-04 ENCOUNTER — Other Ambulatory Visit: Payer: Self-pay

## 2020-10-06 ENCOUNTER — Other Ambulatory Visit: Payer: Self-pay

## 2020-10-13 ENCOUNTER — Ambulatory Visit (INDEPENDENT_AMBULATORY_CARE_PROVIDER_SITE_OTHER): Payer: Medicaid Other

## 2020-10-13 ENCOUNTER — Encounter: Payer: Self-pay | Admitting: Orthopaedic Surgery

## 2020-10-13 ENCOUNTER — Ambulatory Visit (INDEPENDENT_AMBULATORY_CARE_PROVIDER_SITE_OTHER): Payer: Medicaid Other | Admitting: Orthopaedic Surgery

## 2020-10-13 DIAGNOSIS — G5603 Carpal tunnel syndrome, bilateral upper limbs: Secondary | ICD-10-CM | POA: Diagnosis not present

## 2020-10-13 DIAGNOSIS — M25561 Pain in right knee: Secondary | ICD-10-CM | POA: Diagnosis not present

## 2020-10-13 DIAGNOSIS — G8929 Other chronic pain: Secondary | ICD-10-CM

## 2020-10-13 MED ORDER — BUPIVACAINE HCL 0.25 % IJ SOLN
2.0000 mL | INTRAMUSCULAR | Status: AC | PRN
  Administered 2020-10-13: 2 mL via INTRA_ARTICULAR

## 2020-10-13 MED ORDER — METHYLPREDNISOLONE ACETATE 40 MG/ML IJ SUSP
40.0000 mg | INTRAMUSCULAR | Status: AC | PRN
  Administered 2020-10-13: 40 mg via INTRA_ARTICULAR

## 2020-10-13 MED ORDER — LIDOCAINE HCL 1 % IJ SOLN
2.0000 mL | INTRAMUSCULAR | Status: AC | PRN
  Administered 2020-10-13: 2 mL
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
## 2020-10-14 ENCOUNTER — Other Ambulatory Visit: Payer: Self-pay

## 2020-10-14 ENCOUNTER — Ambulatory Visit: Payer: No Typology Code available for payment source | Attending: Family Medicine

## 2020-10-14 DIAGNOSIS — Z3042 Encounter for surveillance of injectable contraceptive: Secondary | ICD-10-CM

## 2020-10-14 MED ORDER — MEDROXYPROGESTERONE ACETATE 150 MG/ML IM SUSP
150.0000 mg | Freq: Once | INTRAMUSCULAR | Status: AC
  Administered 2020-10-14: 150 mg via INTRAMUSCULAR

## 2020-10-14 NOTE — Progress Notes (Signed)
Pt was given depo injection in left ventragluteal. Pt tolerated shot well No urine HCG needed. Pt to return [id number]

## 2020-10-17 ENCOUNTER — Other Ambulatory Visit: Payer: Self-pay

## 2020-10-18 ENCOUNTER — Other Ambulatory Visit: Payer: Self-pay

## 2020-10-18 ENCOUNTER — Ambulatory Visit (INDEPENDENT_AMBULATORY_CARE_PROVIDER_SITE_OTHER): Payer: Medicaid Other

## 2020-10-18 ENCOUNTER — Encounter: Payer: Self-pay | Admitting: Orthopaedic Surgery

## 2020-10-18 ENCOUNTER — Ambulatory Visit (INDEPENDENT_AMBULATORY_CARE_PROVIDER_SITE_OTHER): Payer: Medicaid Other | Admitting: Orthopaedic Surgery

## 2020-10-18 VITALS — Ht 64.0 in | Wt 260.0 lb

## 2020-10-18 DIAGNOSIS — S161XXA Strain of muscle, fascia and tendon at neck level, initial encounter: Secondary | ICD-10-CM

## 2020-10-18 MED ORDER — MELOXICAM 7.5 MG PO TABS
7.5000 mg | ORAL_TABLET | Freq: Two times a day (BID) | ORAL | 2 refills | Status: DC | PRN
  Filled 2020-10-18: qty 30, 15d supply, fill #0

## 2020-10-18 MED ORDER — METHOCARBAMOL 500 MG PO TABS
500.0000 mg | ORAL_TABLET | Freq: Two times a day (BID) | ORAL | 0 refills | Status: DC | PRN
Start: 2020-10-18 — End: 2021-05-08
  Filled 2020-10-18: qty 20, 10d supply, fill #0
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
## 2020-10-21 IMAGING — CT CT NECK WITHOUT CONTRAST
4 of 5 series · 16 of 33 positions shown, 18 images · non-contrast
Comparison: 10/16/2016

CLINICAL DATA: Right ear infection treated with antibiotics 2 weeks
ago. Persistent and worsening pain.

EXAM:
CT NECK WITHOUT CONTRAST
TECHNIQUE: Multidetector CT imaging of the neck was performed following the
standard protocol without intravenous contrast.

[Series 3: neck 2.0 st · axial · 0.51mm/px · z∈[-227,-95]mm · 4 of 112 slices shown, 5 images (1 of 3)]
[im 23/112  soft-tissue]
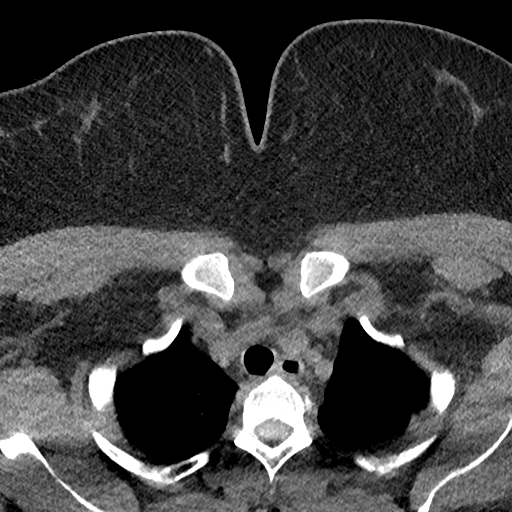
[im 23/112  bone]
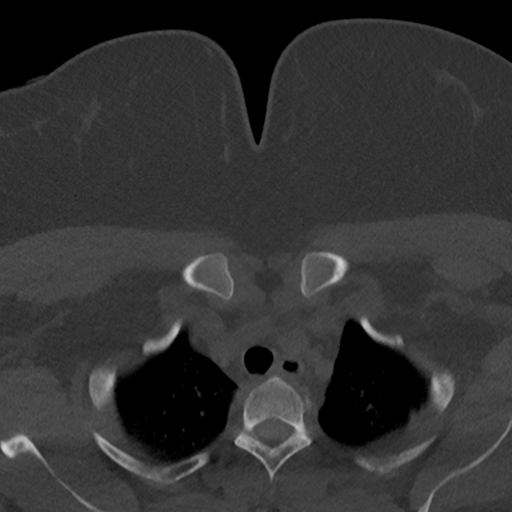
[im 45/112  bone]
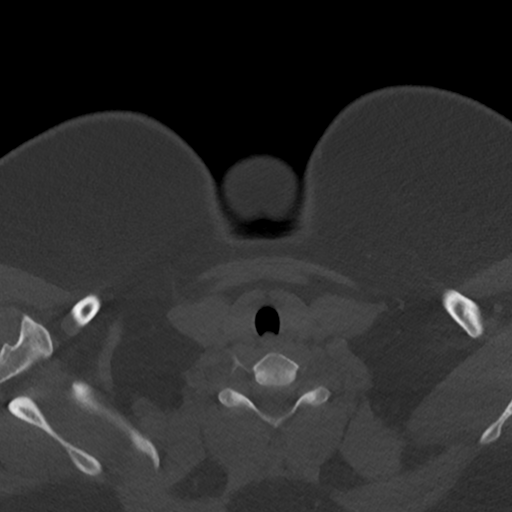
[im 67/112  bone]
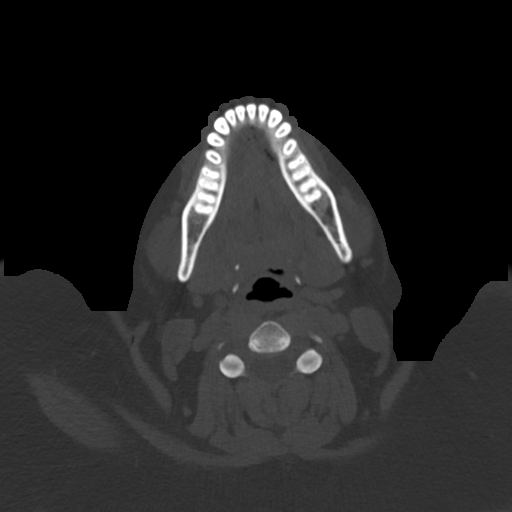
[im 89/112  bone]
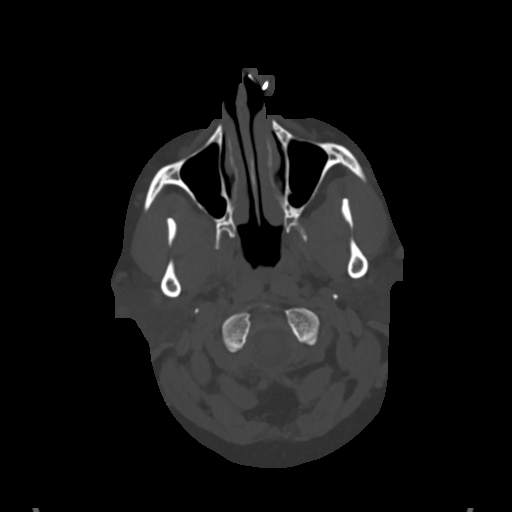

[Series 5: neck 2.0 st · sagittal · 0.46mm/px · 5 of 101 slices shown, 6 images (2 of 3)]
[im 34/101  bone]
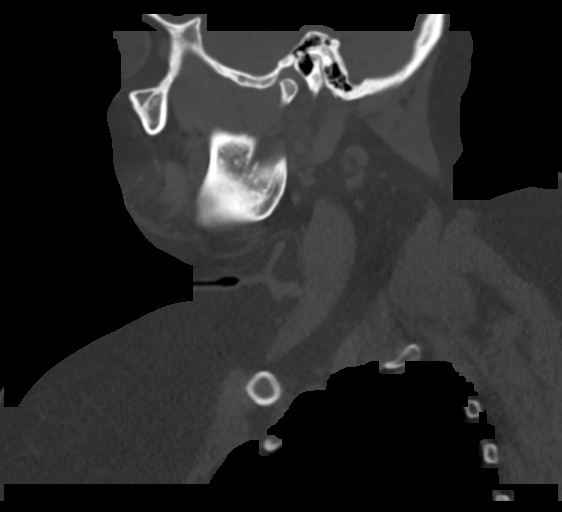
[im 42/101  bone]
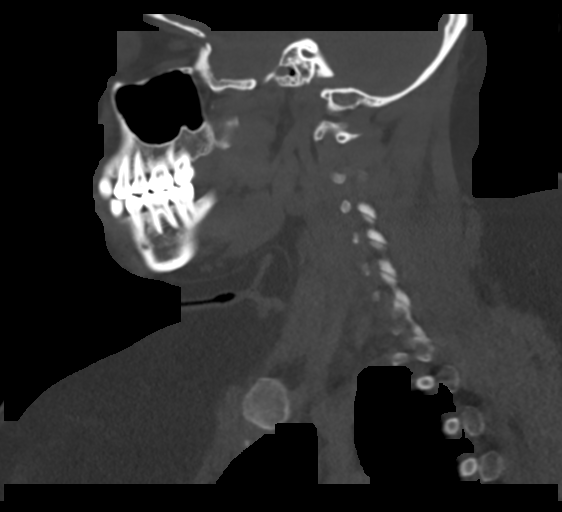
[im 51/101  soft-tissue]
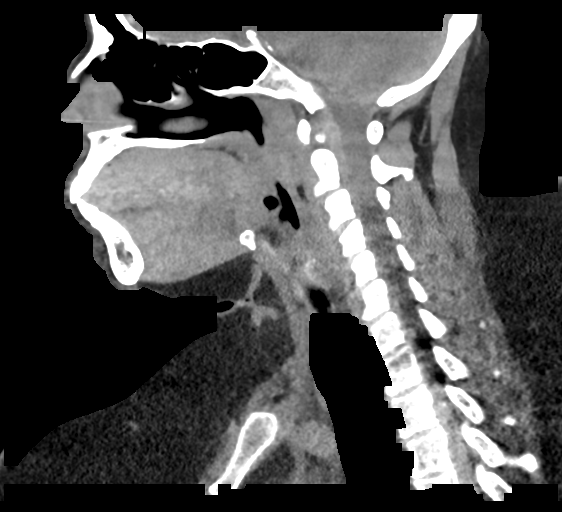
[im 51/101  bone]
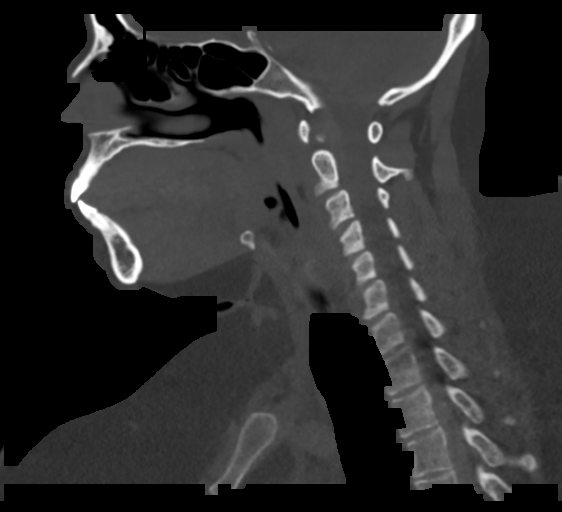
[im 59/101  bone]
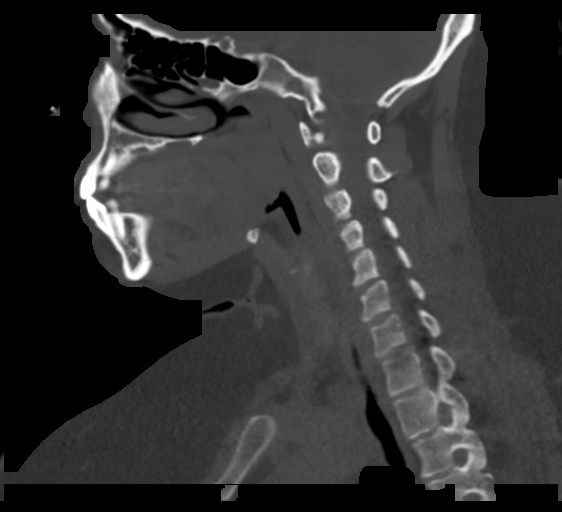
[im 67/101  bone]
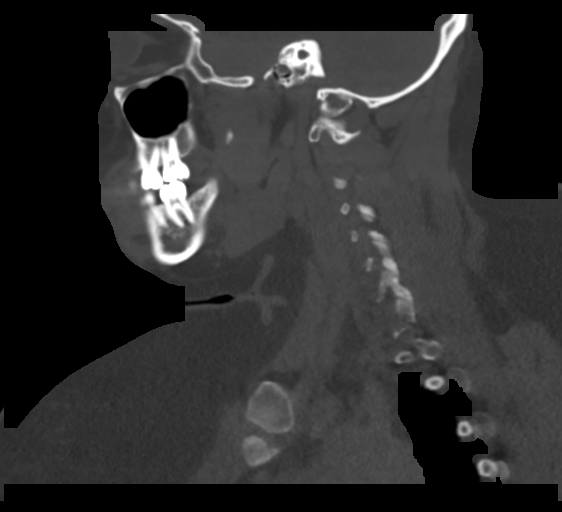

[Series 6: neck 2.0 st · coronal · 0.43mm/px · 3 of 116 slices shown (3 of 3)]
[im 24/116  bone]
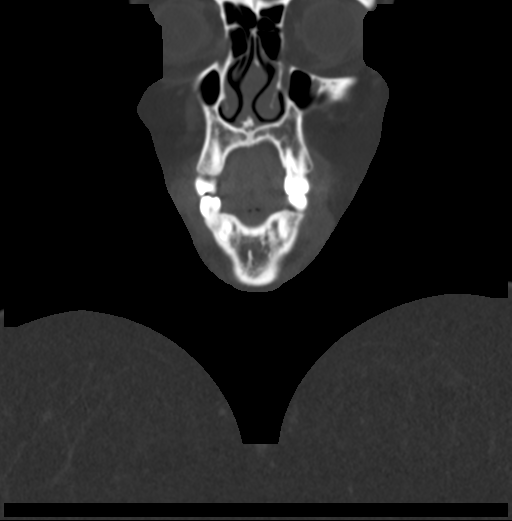
[im 47/116  bone]
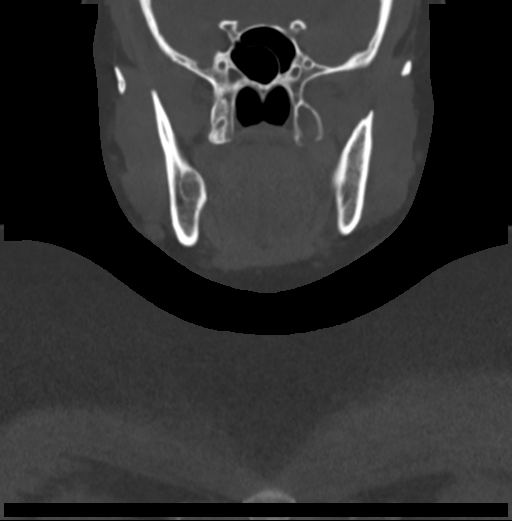
[im 70/116  bone]
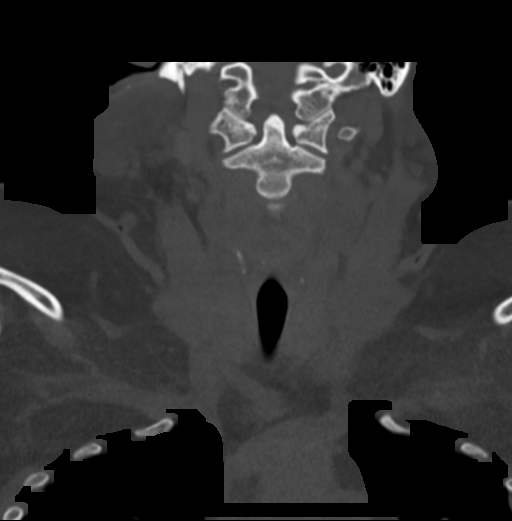

[Series 7: neck 2.0 st orthogonal · axial · 0.39mm/px · z∈[-225,-93]mm · 4 of 111 slices shown]
[im 23/111  bone]
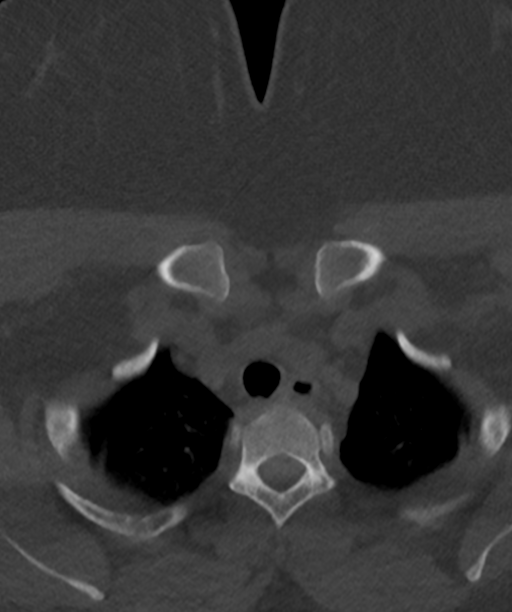
[im 45/111  bone]
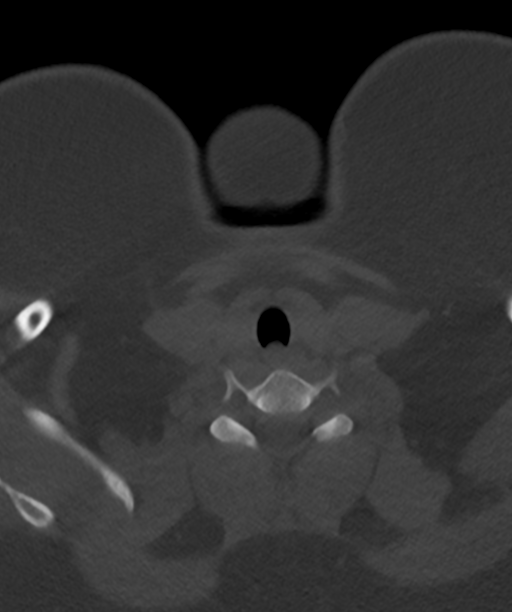
[im 67/111  bone]
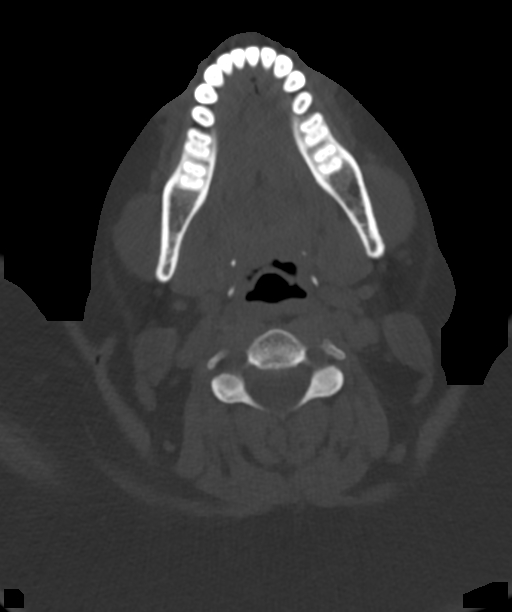
[im 89/111  bone]
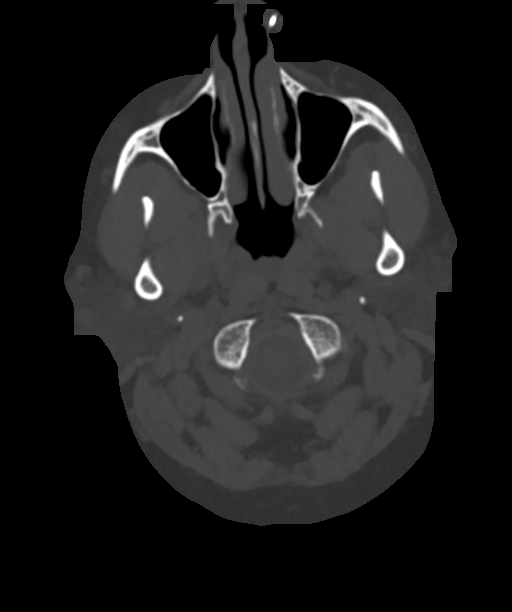

[16 of 33 positions shown; findings below may reference images not displayed]

FINDINGS: Pharynx and larynx: No mucosal or submucosal lesion.

Salivary glands: Parotid and submandibular glands are normal.

Thyroid: Normal

Lymph nodes: No enlarged or low-density nodes on either side of the
neck.

Vascular: Normal

Limited intracranial: Normal

Visualized orbits: Normal

Mastoids and visualized paranasal sinuses: Paranasal sinuses are
clear. No mastoid effusion. No fluid in either middle ear. There
appears to be mild narrowing of the external auditory canal on the
right, possibly due to tissue inflammation.

Skeleton: Normal

Upper chest: Normal

Other: None
IMPRESSION: Narrowing of the external auditory canal on the right, possibly due
to otitis externa. No fluid in the middle ear. Mastoids are clear.
No other regional soft tissue finding.

## 2020-10-24 ENCOUNTER — Encounter: Payer: Self-pay | Admitting: Family Medicine

## 2020-10-24 ENCOUNTER — Ambulatory Visit: Payer: No Typology Code available for payment source | Attending: Family Medicine | Admitting: Family Medicine

## 2020-10-24 ENCOUNTER — Other Ambulatory Visit: Payer: Self-pay

## 2020-10-24 VITALS — BP 130/86 | HR 97 | Ht 64.0 in | Wt 262.0 lb

## 2020-10-24 DIAGNOSIS — L03012 Cellulitis of left finger: Secondary | ICD-10-CM | POA: Diagnosis not present

## 2020-10-24 DIAGNOSIS — Z0000 Encounter for general adult medical examination without abnormal findings: Secondary | ICD-10-CM

## 2020-10-24 MED ORDER — FLUCONAZOLE 150 MG PO TABS
150.0000 mg | ORAL_TABLET | Freq: Once | ORAL | 0 refills | Status: AC
  Filled 2020-10-24: qty 1, 1d supply, fill #0

## 2020-10-24 MED ORDER — CEPHALEXIN 500 MG PO CAPS
500.0000 mg | ORAL_CAPSULE | Freq: Two times a day (BID) | ORAL | 0 refills | Status: DC
  Filled 2020-10-24: qty 14, 7d supply, fill #0

## 2020-10-24 NOTE — Patient Instructions (Signed)
Health Maintenance, Female Adopting a healthy lifestyle and getting preventive care are important in promoting health and wellness. Ask your health care provider about:  The right schedule for you to have regular tests and exams.  Things you can do on your own to prevent diseases and keep yourself healthy. What should I know about diet, weight, and exercise? Eat a healthy diet  Eat a diet that includes plenty of vegetables, fruits, low-fat dairy products, and lean protein.  Do not eat a lot of foods that are high in solid fats, added sugars, or sodium.   Maintain a healthy weight Body mass index (BMI) is used to identify weight problems. It estimates body fat based on height and weight. Your health care provider can help determine your BMI and help you achieve or maintain a healthy weight. Get regular exercise Get regular exercise. This is one of the most important things you can do for your health. Most adults should:  Exercise for at least 150 minutes each week. The exercise should increase your heart rate and make you sweat (moderate-intensity exercise).  Do strengthening exercises at least twice a week. This is in addition to the moderate-intensity exercise.  Spend less time sitting. Even light physical activity can be beneficial. Watch cholesterol and blood lipids Have your blood tested for lipids and cholesterol at 39 years of age, then have this test every 5 years. Have your cholesterol levels checked more often if:  Your lipid or cholesterol levels are high.  You are older than 40 years of age.  You are at high risk for heart disease. What should I know about cancer screening? Depending on your health history and family history, you may need to have cancer screening at various ages. This may include screening for:  Breast cancer.  Cervical cancer.  Colorectal cancer.  Skin cancer.  Lung cancer. What should I know about heart disease, diabetes, and high blood  pressure? Blood pressure and heart disease  High blood pressure causes heart disease and increases the risk of stroke. This is more likely to develop in people who have high blood pressure readings, are of African descent, or are overweight.  Have your blood pressure checked: ? Every 3-5 years if you are 18-39 years of age. ? Every year if you are 40 years old or older. Diabetes Have regular diabetes screenings. This checks your fasting blood sugar level. Have the screening done:  Once every three years after age 40 if you are at a normal weight and have a low risk for diabetes.  More often and at a younger age if you are overweight or have a high risk for diabetes. What should I know about preventing infection? Hepatitis B If you have a higher risk for hepatitis B, you should be screened for this virus. Talk with your health care provider to find out if you are at risk for hepatitis B infection. Hepatitis C Testing is recommended for:  Everyone born from 1945 through 1965.  Anyone with known risk factors for hepatitis C. Sexually transmitted infections (STIs)  Get screened for STIs, including gonorrhea and chlamydia, if: ? You are sexually active and are younger than 39 years of age. ? You are older than 39 years of age and your health care provider tells you that you are at risk for this type of infection. ? Your sexual activity has changed since you were last screened, and you are at increased risk for chlamydia or gonorrhea. Ask your health care provider   if you are at risk.  Ask your health care provider about whether you are at high risk for HIV. Your health care provider may recommend a prescription medicine to help prevent HIV infection. If you choose to take medicine to prevent HIV, you should first get tested for HIV. You should then be tested every 3 months for as long as you are taking the medicine. Pregnancy  If you are about to stop having your period (premenopausal) and  you may become pregnant, seek counseling before you get pregnant.  Take 400 to 800 micrograms (mcg) of folic acid every day if you become pregnant.  Ask for birth control (contraception) if you want to prevent pregnancy. Osteoporosis and menopause Osteoporosis is a disease in which the bones lose minerals and strength with aging. This can result in bone fractures. If you are 65 years old or older, or if you are at risk for osteoporosis and fractures, ask your health care provider if you should:  Be screened for bone loss.  Take a calcium or vitamin D supplement to lower your risk of fractures.  Be given hormone replacement therapy (HRT) to treat symptoms of menopause. Follow these instructions at home: Lifestyle  Do not use any products that contain nicotine or tobacco, such as cigarettes, e-cigarettes, and chewing tobacco. If you need help quitting, ask your health care provider.  Do not use street drugs.  Do not share needles.  Ask your health care provider for help if you need support or information about quitting drugs. Alcohol use  Do not drink alcohol if: ? Your health care provider tells you not to drink. ? You are pregnant, may be pregnant, or are planning to become pregnant.  If you drink alcohol: ? Limit how much you use to 0-1 drink a day. ? Limit intake if you are breastfeeding.  Be aware of how much alcohol is in your drink. In the [place] one drink equals one 12 oz bottle of beer (355 mL), one 5 oz glass of wine (148 mL), or one 1 oz glass of hard liquor (44 mL). General instructions  Schedule regular health, dental, and eye exams.  Stay current with your vaccines.  Tell your health care provider if: ? You often feel depressed. ? You have ever been abused or do not feel safe at home. Summary  Adopting a healthy lifestyle and getting preventive care are important in promoting health and wellness.  Follow your health care provider's instructions about healthy  diet, exercising, and getting tested or screened for diseases.  Follow your health care provider's instructions on monitoring your cholesterol and blood pressure. This information is not intended to replace advice given to you by your health care provider. Make sure you discuss any questions you have with your health care provider. Document Revised: 05/14/2018 Document Reviewed: 05/14/2018 Elsevier Patient Education  2021 Elsevier Inc.  

## 2020-10-24 NOTE — Progress Notes (Signed)
Subjective:  Patient ID: Sarah Garrison, female    DOB: 03-31-1981  Age: 40 y.o. MRN: [id number]  CC: Annual Exam   HPI Sarah Garrison is a 40 year old female with a history of type 2 diabetes mellitus (A1c 12.2-followed by endocrinology, Dr. Dwyane Dee), obesity, history of Covid in 09/2019 seen for a complete physical exam.  Interval History: Last Pap smear in 02/2019 was normal.  She has no family history of breast cancer. 3 days ago her artificial nail got stuck in the door knob and it came off.  She subsequently noticed a cut in her second middle finger with progressive swelling of the pulp of her finger. Seen by [facility] and was informed she has signs of Diabetic retinopathy. A1c is managed by endocrine. Past Medical History:  Diagnosis Date  . Asthma   . COVID-19   . Diabetes mellitus without complication ([place])   . GERD (gastroesophageal reflux disease)   . Hypertension   . Seizures ([place])     Past Surgical History:  Procedure Laterality Date  . CESAREAN SECTION    . HERNIA REPAIR    . TUBAL LIGATION    . VENTRAL HERNIA REPAIR N/A 11/27/2017   Procedure: [place];  Surgeon: Erroll Luna, MD;  Location: [facility];  Service: General;  Laterality: N/A;  . WISDOM TOOTH EXTRACTION      Family History  Problem Relation Age of Onset  . Asthma Mother   . Kidney failure Mother   . Brain cancer Mother   . Asthma Father   . Other Father        surgery on stomach but don't know from what  . Diabetes Brother   . Diabetes Maternal Grandmother     Allergies  Allergen Reactions  . Morphine And Related Shortness Of Breath  . Glyburide Diarrhea and Other (See Comments)    Reaction:  Nose bleeds   . Ivp Dye [Iodinated Diagnostic Agents] Other (See Comments)    Shortness of breath.    . Oxycodone Other (See Comments)    Pt states that this medication makes her "dream about rabbits chasing" her.    . Vicodin  [Hydrocodone-Acetaminophen] Other (See Comments)    Pt states that this medication makes her "dream about rabbits chasing" her.      Outpatient Medications Prior to Visit  Medication Sig Dispense Refill  . albuterol (VENTOLIN HFA) 108 (90 Base) MCG/ACT inhaler Inhale 1-2 puffs into the lungs every 6 (six) hours as needed for wheezing or shortness of breath. 18 g 1  . atorvastatin (LIPITOR) 20 MG tablet TAKE 1 TABLET (20 MG TOTAL) BY MOUTH DAILY. 90 tablet 1  . cetirizine (ZYRTEC) 10 MG tablet TAKE 1 TABLET (10 MG TOTAL) BY MOUTH DAILY. 30 tablet 2  . Continuous Blood Gluc Receiver ([place]) DEVI 1 each by Misc.(Non-Drug; Combo Route) route daily.    . Continuous Blood Gluc Sensor (DEXCOM G6 SENSOR) MISC SMARTSIG:1 Topical Every 10 Days    . Continuous Blood Gluc Transmit (DEXCOM G6 TRANSMITTER) MISC USE TO CHECK BLOOD SUGAR EVERY 3 MONTHS    . fluticasone (FLONASE) 50 MCG/ACT nasal spray PLACE 1 SPRAY INTO BOTH NOSTRILS IN THE MORNING AND AT BEDTIME. 16 g 2  . fluticasone-salmeterol (ADVAIR HFA) 115-21 MCG/ACT inhaler Inhale 2 puffs into the lungs 2 (two) times daily. 1 Inhaler 12  . Fluticasone-Salmeterol (ADVAIR) 100-50 MCG/DOSE AEPB Inhale 1 puff into the lungs 2 (two) times daily.    Marland Kitchen  insulin aspart (NOVOLOG) 100 UNIT/ML injection use via insulin pump. max total daily dose of 300 units 530 mL 0  . insulin degludec (TRESIBA FLEXTOUCH) 100 UNIT/ML FlexTouch Pen Inject 60 units once a day as needed in setting of pump failure 30 mL 2  . insulin degludec (TRESIBA) 100 UNIT/ML FlexTouch Pen Inject into the skin.    Marland Kitchen insulin degludec (TRESIBA) 100 UNIT/ML FlexTouch Pen INJECT 40 UNITS INTO THE SKIN DAILY. 15 mL 5  . Insulin Human (INSULIN PUMP) SOLN Inject 1 each into the skin See admin instructions. Medication: Novolog 100 units/ml injection. Takes 100 units via pump per patient.    . Insulin Pen Needle 32G X 4 MM MISC USE TO ADMINISTER INSULIN DAILY 100 each 5  . Insulin  Syringe-Needle U-100 31G X 5/16" 1 ML MISC inject 10 units into the skin 3 times daily. use to inject novolog based on insulin to card ratio- max daily dose 150 units 100 each 2  . Lactulose 20 GM/30ML SOLN Take 30 mLs (20 g total) by mouth at bedtime. For 3 nights then as needed for constipation 450 mL 0  . lidocaine (XYLOCAINE) 2 % solution USE AS DIRECTED 10 MLS IN THE MOUTH OR THROAT AS NEEDED FOR MOUTH PAIN. 100 mL 0  . lisinopril (ZESTRIL) 20 MG tablet TAKE 1 TABLET (20 MG TOTAL) BY MOUTH DAILY. 90 tablet 2  . losartan (COZAAR) 50 MG tablet Take 1 tablet (50 mg total) by mouth daily. 90 tablet 3  . meloxicam (MOBIC) 7.5 MG tablet Take 1 tablet (7.5 mg total) by mouth 2 (two) times daily as needed for up to 14 doses for pain. 30 tablet 2  . methocarbamol (ROBAXIN) 500 MG tablet Take 1 tablet (500 mg total) by mouth 2 (two) times daily as needed. 20 tablet 0  . Misc. Devices MISC CPAP therapy on autopap 5-15.  Needs Small size Fisher[employer]Paykel Full Face Mask Simplus  mask and heated humidification. 1 each 0  . neomycin-polymyxin-hydrocortisone (CORTISPORIN) OTIC solution APPLY 1-2 DROPS TO TOENAIL TWICE A DAY AND COVER WITH BANDAID AS NEEDED 10 mL 0  . nystatin (MYCOSTATIN) 100000 UNIT/ML suspension Take 5 mLs by mouth 4 (four) times daily.    . pantoprazole (PROTONIX) 40 MG tablet TAKE 1 TABLET (40 MG TOTAL) BY MOUTH DAILY. 30 tablet 3  . sertraline (ZOLOFT) 100 MG tablet TAKE 1 TABLET (100 MG TOTAL) BY MOUTH DAILY. 30 tablet 3  . TRUEPLUS PEN NEEDLES 32G X 4 MM MISC USE 3 TIMES DAILY AS DIRECTED 100 each 5  . VITAMIN D PO Take 1 tablet by mouth 2 (two) times daily.    . sitaGLIPtin (JANUVIA) 100 MG tablet Take by mouth. (Patient not taking: Reported on 10/24/2020)    . atorvastatin (LIPITOR) 20 MG tablet Take 1 tablet (20 mg total) by mouth daily. 90 tablet 3  . fluticasone (FLONASE) 50 MCG/ACT nasal spray PLACE 2 SPRAYS INTO BOTH NOSTRILS DAILY. 16 g 1  . insulin aspart (NOVOLOG) 100 UNIT/ML  injection USE VIA INSULIN PUMP. MAX TOTAL DAILY DOSE OF 200 UNITS    . insulin aspart (NOVOLOG) 100 UNIT/ML injection USE VIA INSULIN PUMP. MAX TOTAL DAILY DOSE OF 250 UNITS 80 mL 5  . insulin aspart (NOVOLOG) 100 UNIT/ML injection USE VIA INSULIN PUMP. MAX TOTAL DAILY DOSE OF 200 UNITS 60 mL 3  . insulin aspart (NOVOLOG) 100 UNIT/ML injection USE VIA INSULIN PUMP. MAX TOTAL DAILY DOSE OF 300 UNITS 530 mL 0  . Insulin Disposable  Pump (OMNIPOD DASH 5 PACK PODS) MISC Take by mouth every other day.    . Insulin Syringe-Needle U-100 30G X 5/16" 0.3 ML MISC use to fill pump cartridge with insulin as directed 15 each 0  . lisinopril (ZESTRIL) 20 MG tablet Take 1 tablet by mouth daily.     No facility-administered medications prior to visit.     ROS Review of Systems  Constitutional: Negative for activity change, appetite change and fatigue.  HENT: Negative for congestion, sinus pressure and sore throat.   Eyes: Negative for visual disturbance.  Respiratory: Negative for cough, chest tightness, shortness of breath and wheezing.   Cardiovascular: Negative for chest pain and palpitations.  Gastrointestinal: Negative for abdominal distention, abdominal pain and constipation.  Endocrine: Negative for polydipsia.  Genitourinary: Negative for dysuria and frequency.  Musculoskeletal:       See HPI  Skin: Negative for rash.  Neurological: Negative for tremors, light-headedness and numbness.  Hematological: Does not bruise/bleed easily.  Psychiatric/Behavioral: Negative for agitation and behavioral problems.    Objective:  BP 130/86   Pulse 97   Ht 5' 4"  (1.626 m)   Wt 262 lb (118.8 kg)   SpO2 100%   BMI 44.97 kg/m   BP/Weight 10/24/2020 11/22/3084 10/09/8467  Systolic BP 629 - 528  Diastolic BP 86 - 94  Wt. (Lbs) 262 260 260  BMI 44.97 44.63 44.63      Physical Exam Constitutional:      General: She is not in acute distress.    Appearance: She is well-developed. She is not  diaphoretic.  HENT:     Head: Normocephalic.     Right Ear: External ear normal.     Left Ear: External ear normal.     Nose: Nose normal.  Eyes:     Conjunctiva/sclera: Conjunctivae normal.     Pupils: Pupils are equal, round, and reactive to light.  Neck:     Vascular: No JVD.  Cardiovascular:     Rate and Rhythm: Normal rate and regular rhythm.     Heart sounds: Normal heart sounds. No murmur heard. No gallop.   Pulmonary:     Effort: Pulmonary effort is normal. No respiratory distress.     Breath sounds: Normal breath sounds. No wheezing or rales.  Chest:     Chest wall: No tenderness.  Breasts:     Right: No mass or tenderness.     Left: No mass or tenderness.    Abdominal:     General: Bowel sounds are normal. There is no distension.     Palpations: Abdomen is soft. There is no mass.     Tenderness: There is no abdominal tenderness.  Musculoskeletal:        General: No tenderness. Normal range of motion.     Cervical back: Normal range of motion.     Comments: No erythema of left middle finger but slight edema of pulp.  No discharge noted.  Slight laceration between nail tip of finger.  Skin:    General: Skin is warm and dry.  Neurological:     Mental Status: She is alert and oriented to person, place, and time.     Deep Tendon Reflexes: Reflexes are normal and symmetric.     CMP Latest Ref Rng & Units 01/26/2020 10/16/2019 10/09/2019  Glucose 70 - 99 mg/dL 227(H) 320(H) 332(H)  BUN 6 - 20 mg/dL 16 15 18   Creatinine 0.44 - 1.00 mg/dL 0.58 0.80 0.58  Sodium 135 - 145  mmol/L 134(L) 132(L) 133(L)  Potassium 3.5 - 5.1 mmol/L 3.9 4.8 3.4(L)  Chloride 98 - 111 mmol/L 105 99 101  CO2 22 - 32 mmol/L 18(L) 22 23  Calcium 8.9 - 10.3 mg/dL 9.0 8.8(L) 8.3(L)  Total Protein 6.5 - 8.1 g/dL 7.1 7.0 5.8(L)  Total Bilirubin 0.3 - 1.2 mg/dL 0.3 1.7(H) 0.6  Alkaline Phos 38 - 126 U/L 89 100 72  AST 15 - 41 U/L 15 20 18   ALT 0 - 44 U/L 18 47(H) 21    Lipid Panel     Component  Value Date/Time   CHOL 300 (H) 03/03/2018 1419   TRIG 246 (H) 10/05/2019 1135   HDL 46.30 03/03/2018 1419   CHOLHDL 6 03/03/2018 1419   VLDL 37.4 03/03/2018 1419   LDLCALC 216 (H) 03/03/2018 1419    CBC    Component Value Date/Time   WBC 6.6 01/26/2020 0235   RBC 5.10 01/26/2020 0235   HGB 15.2 (H) 01/26/2020 0235   HGB 14.6 10/16/2017 1025   HCT 44.2 01/26/2020 0235   HCT 43.2 10/16/2017 1025   PLT 337 01/26/2020 0235   PLT 302 10/16/2017 1025   MCV 86.7 01/26/2020 0235   MCV 89 10/16/2017 1025   MCH 29.8 01/26/2020 0235   MCHC 34.4 01/26/2020 0235   RDW 12.0 01/26/2020 0235   RDW 13.2 10/16/2017 1025   LYMPHSABS 3.3 01/26/2020 0235   LYMPHSABS 3.4 (H) 10/16/2017 1025   MONOABS 0.4 01/26/2020 0235   EOSABS 0.0 01/26/2020 0235   EOSABS 0.1 10/16/2017 1025   BASOSABS 0.0 01/26/2020 0235   BASOSABS 0.0 10/16/2017 1025    Lab Results  Component Value Date   HGBA1C 12.2 (H) 10/07/2019    Assessment & Plan:  1. Annual physical exam Counseled on 150 minutes of exercise per week, healthy eating (including decreased daily intake of saturated fats, cholesterol, added sugars, sodium), routine healthcare maintenance. - CMP14+EGFR; Future - Lipid panel; Future - CBC with Differential/Platelet; Future  2. Paronychia of finger of left hand Advised to use warm soaks If symptoms worsen she would need to go to the urgent care Placed on Keflex and also provided antifungal for vaginal candidiasis prophylaxis. - cephALEXin (KEFLEX) 500 MG capsule; Take 1 capsule (500 mg total) by mouth 2 (two) times daily.  Dispense: 14 capsule; Refill: 0   Strongly advised to follow-up with endocrinologist for diabetes management. Meds ordered this encounter  Medications  . cephALEXin (KEFLEX) 500 MG capsule    Sig: Take 1 capsule (500 mg total) by mouth 2 (two) times daily.    Dispense:  14 capsule    Refill:  0  . fluconazole (DIFLUCAN) 150 MG tablet    Sig: Take 1 tablet (150 mg total)  by mouth once for 1 dose.    Dispense:  1 tablet    Refill:  0    Follow-up: Return in about 6 months (around 04/26/2021) for Medical conditions.       Charlott Rakes, MD, FAAFP. [facility] [place], [place]   10/24/2020, 12:23 PM

## 2020-10-25 ENCOUNTER — Other Ambulatory Visit: Payer: Self-pay

## 2020-10-25 ENCOUNTER — Ambulatory Visit: Payer: Medicaid Other | Attending: Physician Assistant

## 2020-10-25 DIAGNOSIS — M25511 Pain in right shoulder: Secondary | ICD-10-CM | POA: Diagnosis present

## 2020-10-25 DIAGNOSIS — G8929 Other chronic pain: Secondary | ICD-10-CM

## 2020-10-25 DIAGNOSIS — M542 Cervicalgia: Secondary | ICD-10-CM

## 2020-10-25 DIAGNOSIS — R252 Cramp and spasm: Secondary | ICD-10-CM | POA: Diagnosis present

## 2020-10-25 DIAGNOSIS — M25611 Stiffness of right shoulder, not elsewhere classified: Secondary | ICD-10-CM | POA: Diagnosis present

## 2020-10-25 DIAGNOSIS — R293 Abnormal posture: Secondary | ICD-10-CM | POA: Diagnosis present

## 2020-10-25 NOTE — Patient Instructions (Signed)
Access Code: [email or web address] URL: [email or web address] Date: 10/25/2020 Prepared by: Tresa Endo  Exercises Seated Cervical Flexion AROM - 3 x daily - 7 x weekly - 1 sets - 3 reps - 20 hold Seated Cervical Sidebending AROM - 3 x daily - 7 x weekly - 1 sets - 3 reps - 20 hold Seated Cervical Rotation AROM - 3 x daily - 7 x weekly - 1 sets - 3 reps - 20 hold Seated Correct Posture - 1 x daily - 7 x weekly - 3 sets - 10 reps Seated Shoulder Shrug Circles AROM Backward - 3 x daily - 7 x weekly - 1 sets - 10 reps Supine Shoulder Flexion AAROM - 2 x daily - 7 x weekly - 1 sets - 5 reps - 5 hold
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
## 2020-10-26 ENCOUNTER — Ambulatory Visit: Payer: Medicaid Other | Attending: Family Medicine

## 2020-10-26 NOTE — Addendum Note (Signed)
Addended by: Nicanor Alcon on: 10/26/2020 09:06 AM   Modules accepted: Orders

## 2020-10-27 ENCOUNTER — Encounter: Payer: Self-pay | Admitting: Family Medicine

## 2020-10-27 ENCOUNTER — Other Ambulatory Visit: Payer: Self-pay | Admitting: Family Medicine

## 2020-10-27 LAB — CMP14+EGFR
ALT: 20 IU/L (ref 0–32)
AST: 10 IU/L (ref 0–40)
Albumin/Globulin Ratio: 1.6 (ref 1.2–2.2)
Albumin: 4.1 g/dL (ref 3.8–4.8)
Alkaline Phosphatase: 109 IU/L (ref 44–121)
BUN/Creatinine Ratio: 14 (ref 9–23)
BUN: 12 mg/dL (ref 6–20)
Bilirubin Total: 0.5 mg/dL (ref 0.0–1.2)
CO2: 20 mmol/L (ref 20–29)
Calcium: 9.6 mg/dL (ref 8.7–10.2)
Chloride: 103 mmol/L (ref 96–106)
Creatinine, Ser: 0.86 mg/dL (ref 0.57–1.00)
Globulin, Total: 2.5 g/dL (ref 1.5–4.5)
Glucose: 179 mg/dL — ABNORMAL HIGH (ref 65–99)
Potassium: 4.8 mmol/L (ref 3.5–5.2)
Sodium: 137 mmol/L (ref 134–144)
Total Protein: 6.6 g/dL (ref 6.0–8.5)
eGFR: 89 mL/min/{1.73_m2} (ref >59)

## 2020-10-27 LAB — CBC WITH DIFFERENTIAL/PLATELET
Basophils Absolute: 0 10*3/uL (ref 0.0–0.2)
Basos: 1 %
EOS (ABSOLUTE): 0 10*3/uL (ref 0.0–0.4)
Eos: 1 %
Hematocrit: 44.5 % (ref 34.0–46.6)
Hemoglobin: 14.5 g/dL (ref 11.1–15.9)
Immature Grans (Abs): 0 10*3/uL (ref 0.0–0.1)
Immature Granulocytes: 1 %
Lymphocytes Absolute: 3.1 10*3/uL (ref 0.7–3.1)
Lymphs: 43 %
MCH: 29.2 pg (ref 26.6–33.0)
MCHC: 32.6 g/dL (ref 31.5–35.7)
MCV: 90 fL (ref 79–97)
Monocytes Absolute: 0.5 10*3/uL (ref 0.1–0.9)
Monocytes: 7 %
Neutrophils Absolute: 3.6 10*3/uL (ref 1.4–7.0)
Neutrophils: 47 %
Platelets: 339 10*3/uL (ref 150–450)
RBC: 4.97 x10E6/uL (ref 3.77–5.28)
RDW: 12.4 % (ref 11.7–15.4)
WBC: 7.3 10*3/uL (ref 3.4–10.8)

## 2020-10-27 LAB — LIPID PANEL
Chol/HDL Ratio: 4.9 ratio — ABNORMAL HIGH (ref 0.0–4.4)
Cholesterol, Total: 244 mg/dL — ABNORMAL HIGH (ref 100–199)
HDL: 50 mg/dL (ref >39)
LDL Chol Calc (NIH): 179 mg/dL — ABNORMAL HIGH (ref 0–99)
Triglycerides: 85 mg/dL (ref 0–149)
VLDL Cholesterol Cal: 15 mg/dL (ref 5–40)

## 2020-10-27 MED ORDER — ATORVASTATIN CALCIUM 40 MG PO TABS: 40.0000 mg | ORAL_TABLET | Freq: Every day | ORAL | 1 refills | Status: DC

## 2020-11-07 ENCOUNTER — Encounter: Payer: Self-pay | Admitting: Physical Therapy

## 2020-11-07 ENCOUNTER — Ambulatory Visit: Payer: Medicaid Other | Attending: Physician Assistant | Admitting: Physical Therapy

## 2020-11-07 ENCOUNTER — Other Ambulatory Visit: Payer: Self-pay

## 2020-11-07 DIAGNOSIS — M2560 Stiffness of unspecified joint, not elsewhere classified: Secondary | ICD-10-CM | POA: Diagnosis present

## 2020-11-07 DIAGNOSIS — M25611 Stiffness of right shoulder, not elsewhere classified: Secondary | ICD-10-CM | POA: Diagnosis present

## 2020-11-07 DIAGNOSIS — M25511 Pain in right shoulder: Secondary | ICD-10-CM | POA: Insufficient documentation

## 2020-11-07 DIAGNOSIS — G8929 Other chronic pain: Secondary | ICD-10-CM | POA: Diagnosis present

## 2020-11-07 DIAGNOSIS — R293 Abnormal posture: Secondary | ICD-10-CM | POA: Insufficient documentation

## 2020-11-07 DIAGNOSIS — M542 Cervicalgia: Secondary | ICD-10-CM | POA: Insufficient documentation

## 2020-11-07 DIAGNOSIS — M25551 Pain in right hip: Secondary | ICD-10-CM | POA: Insufficient documentation

## 2020-11-07 DIAGNOSIS — R252 Cramp and spasm: Secondary | ICD-10-CM | POA: Insufficient documentation
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
## 2020-11-10 ENCOUNTER — Ambulatory Visit: Payer: Medicaid Other | Admitting: Physical Therapy

## 2020-11-14 ENCOUNTER — Ambulatory Visit: Payer: Medicaid Other | Admitting: Physical Therapy

## 2020-11-14 ENCOUNTER — Encounter: Payer: Self-pay | Admitting: Physical Therapy

## 2020-11-14 ENCOUNTER — Other Ambulatory Visit: Payer: Self-pay

## 2020-11-14 VITALS — HR 103

## 2020-11-14 DIAGNOSIS — M25611 Stiffness of right shoulder, not elsewhere classified: Secondary | ICD-10-CM

## 2020-11-14 DIAGNOSIS — G8929 Other chronic pain: Secondary | ICD-10-CM

## 2020-11-14 DIAGNOSIS — M542 Cervicalgia: Secondary | ICD-10-CM | POA: Diagnosis not present

## 2020-11-14 DIAGNOSIS — R293 Abnormal posture: Secondary | ICD-10-CM

## 2020-11-14 DIAGNOSIS — R252 Cramp and spasm: Secondary | ICD-10-CM
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
## 2020-11-15 ENCOUNTER — Telehealth: Payer: Self-pay | Admitting: Orthopaedic Surgery

## 2020-11-15 NOTE — Telephone Encounter (Signed)
See message below. FYI patient has appt 11/17/2020.

## 2020-11-15 NOTE — Telephone Encounter (Signed)
Denise with PT called stating the pt has a growing adema. Angelique Blonder also stated the pt is supposed to be doing PT for her neck and R shoulder but isn't tolerating too well. Angelique Blonder would like either a CB or a response in the in-basket she sent to Elnita Maxwell.  CB# [phone] * will have to ask for [facility]

## 2020-11-16 NOTE — Telephone Encounter (Signed)
I replied to PT message about this a few days ago and I believe you  have already talked to the patient to have her come in?

## 2020-11-17 ENCOUNTER — Encounter: Payer: Self-pay | Admitting: Orthopaedic Surgery

## 2020-11-17 ENCOUNTER — Ambulatory Visit (INDEPENDENT_AMBULATORY_CARE_PROVIDER_SITE_OTHER): Payer: Medicaid Other | Admitting: Orthopaedic Surgery

## 2020-11-17 ENCOUNTER — Ambulatory Visit: Payer: Medicaid Other | Admitting: Physical Therapy

## 2020-11-17 ENCOUNTER — Other Ambulatory Visit: Payer: Self-pay

## 2020-11-17 ENCOUNTER — Telehealth: Payer: Self-pay | Admitting: Physical Therapy

## 2020-11-17 DIAGNOSIS — M25511 Pain in right shoulder: Secondary | ICD-10-CM

## 2020-11-17 DIAGNOSIS — G8929 Other chronic pain: Secondary | ICD-10-CM | POA: Diagnosis not present

## 2020-11-17 DIAGNOSIS — M542 Cervicalgia: Secondary | ICD-10-CM | POA: Diagnosis not present

## 2020-11-17 MED FILL — Pantoprazole Sodium EC Tab 40 MG (Base Equiv): ORAL | 30 days supply | Qty: 30 | Fill #0 | Status: AC

## 2020-11-17 NOTE — Telephone Encounter (Signed)
PT called and spoke to patient following her MD visit.  MD ordered MRI.  Patient in agreement to cancel today and Tuesday visit to see if we get MRI results and will keep PT visit next week, Thur 11/24/20 with patient continuing to perform HEP.
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
## 2020-11-22 ENCOUNTER — Ambulatory Visit: Payer: Medicaid Other | Admitting: Physical Therapy

## 2020-11-24 ENCOUNTER — Ambulatory Visit: Payer: Medicaid Other | Admitting: Physical Therapy

## 2020-11-24 ENCOUNTER — Other Ambulatory Visit: Payer: Self-pay

## 2020-11-24 DIAGNOSIS — M2560 Stiffness of unspecified joint, not elsewhere classified: Secondary | ICD-10-CM

## 2020-11-24 DIAGNOSIS — M542 Cervicalgia: Secondary | ICD-10-CM

## 2020-11-24 DIAGNOSIS — R293 Abnormal posture: Secondary | ICD-10-CM

## 2020-11-24 DIAGNOSIS — R252 Cramp and spasm: Secondary | ICD-10-CM

## 2020-11-24 DIAGNOSIS — M25611 Stiffness of right shoulder, not elsewhere classified: Secondary | ICD-10-CM

## 2020-11-24 DIAGNOSIS — G8929 Other chronic pain: Secondary | ICD-10-CM
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
## 2020-11-28 ENCOUNTER — Encounter: Payer: Self-pay | Admitting: Physical Therapy

## 2020-11-28 ENCOUNTER — Ambulatory Visit: Payer: Medicaid Other | Admitting: Physical Therapy

## 2020-11-28 ENCOUNTER — Other Ambulatory Visit: Payer: Self-pay

## 2020-11-28 DIAGNOSIS — M2560 Stiffness of unspecified joint, not elsewhere classified: Secondary | ICD-10-CM

## 2020-11-28 DIAGNOSIS — R293 Abnormal posture: Secondary | ICD-10-CM

## 2020-11-28 DIAGNOSIS — M25551 Pain in right hip: Secondary | ICD-10-CM

## 2020-11-28 DIAGNOSIS — R252 Cramp and spasm: Secondary | ICD-10-CM

## 2020-11-28 DIAGNOSIS — M542 Cervicalgia: Secondary | ICD-10-CM

## 2020-11-28 DIAGNOSIS — G8929 Other chronic pain: Secondary | ICD-10-CM

## 2020-11-28 DIAGNOSIS — M25611 Stiffness of right shoulder, not elsewhere classified: Secondary | ICD-10-CM

## 2020-11-28 NOTE — Patient Instructions (Addendum)
Access Code: [email or web address] URL: [email or web address] Date: 11/28/2020 Prepared by: Myrla Halsted  NEW Exercises  Supine Quad Set - 1 x daily - 7 x weekly - 10 reps - 5sec hold  Patient Education Administrator, arts
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
## 2020-12-01 ENCOUNTER — Encounter: Payer: Medicaid Other | Admitting: Physical Therapy

## 2020-12-03 ENCOUNTER — Ambulatory Visit
Admission: RE | Admit: 2020-12-03 | Discharge: 2020-12-03 | Disposition: A | Discharge status: Home / Self Care | Payer: Medicaid Other | Source: Ambulatory Visit | Attending: Orthopaedic Surgery | Admitting: Orthopaedic Surgery

## 2020-12-03 DIAGNOSIS — M542 Cervicalgia: Secondary | ICD-10-CM

## 2020-12-03 DIAGNOSIS — G8929 Other chronic pain: Secondary | ICD-10-CM

## 2020-12-06 ENCOUNTER — Ambulatory Visit: Payer: Medicaid Other | Admitting: Physical Therapy

## 2020-12-08 ENCOUNTER — Other Ambulatory Visit: Payer: Self-pay

## 2020-12-08 ENCOUNTER — Ambulatory Visit: Payer: Medicaid Other | Attending: Physician Assistant | Admitting: Physical Therapy

## 2020-12-08 DIAGNOSIS — M2560 Stiffness of unspecified joint, not elsewhere classified: Secondary | ICD-10-CM | POA: Insufficient documentation

## 2020-12-08 DIAGNOSIS — R293 Abnormal posture: Secondary | ICD-10-CM | POA: Diagnosis present

## 2020-12-08 DIAGNOSIS — G8929 Other chronic pain: Secondary | ICD-10-CM | POA: Insufficient documentation

## 2020-12-08 DIAGNOSIS — M25511 Pain in right shoulder: Secondary | ICD-10-CM | POA: Diagnosis present

## 2020-12-08 DIAGNOSIS — M542 Cervicalgia: Secondary | ICD-10-CM | POA: Insufficient documentation

## 2020-12-08 DIAGNOSIS — M25611 Stiffness of right shoulder, not elsewhere classified: Secondary | ICD-10-CM | POA: Diagnosis present

## 2020-12-08 DIAGNOSIS — R252 Cramp and spasm: Secondary | ICD-10-CM | POA: Diagnosis present
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
## 2020-12-12 ENCOUNTER — Ambulatory Visit: Payer: Medicaid Other | Admitting: Physical Therapy

## 2020-12-15 ENCOUNTER — Encounter: Payer: Medicaid Other | Admitting: Physical Therapy

## 2020-12-15 ENCOUNTER — Ambulatory Visit (INDEPENDENT_AMBULATORY_CARE_PROVIDER_SITE_OTHER): Payer: Medicaid Other | Admitting: Orthopaedic Surgery

## 2020-12-15 ENCOUNTER — Encounter: Payer: Self-pay | Admitting: Orthopaedic Surgery

## 2020-12-15 DIAGNOSIS — M25511 Pain in right shoulder: Secondary | ICD-10-CM

## 2020-12-15 DIAGNOSIS — G8929 Other chronic pain: Secondary | ICD-10-CM | POA: Diagnosis not present

## 2020-12-15 NOTE — Progress Notes (Signed)
Office Visit Note   Patient: Sarah Garrison           Date of Birth: Oct 19, 1980           MRN: [id number] Visit Date: 12/15/2020              Requested by: No referring provider defined for this encounter. PCP: Pcp, No   Assessment & Plan: Visit Diagnoses:  1. Chronic right shoulder pain     Plan: MRI of the C-spine is normal.  MRI of the right shoulder shows moderate tendinopathy of the supraspinatus, infraspinatus, subscapularis, biceps tendon.  Questionable posterior superior labral tear with paralabral cyst.  Given these findings I recommended an intra-articular injection of the shoulder with Toradol under ultrasound guidance by Dr. Prince Rome.  She wanted to wait until next week to get this injection while she is off work.  I have instructed her to follow-up with [place] in about 3 to 4 weeks if she is not feel any relief from this injection.  Follow-Up Instructions: Return if symptoms worsen or fail to improve.   Orders:  No orders of the defined types were placed in this encounter.  No orders of the defined types were placed in this encounter.     Procedures: No procedures performed   Clinical Data: No additional findings.   Subjective: Chief Complaint  Patient presents with   Right Shoulder - Pain   Neck - Pain    Sarah Garrison returns today for MRI review of the C-spine.  She reports no changes.   Review of Systems  Constitutional: Negative.   HENT: Negative.    Eyes: Negative.   Respiratory: Negative.    Cardiovascular: Negative.   Endocrine: Negative.   Musculoskeletal: Negative.   Neurological: Negative.   Hematological: Negative.   Psychiatric/Behavioral: Negative.    All other systems reviewed and are negative.   Objective: Vital Signs: There were no vitals taken for this visit.  Physical Exam Vitals and nursing note reviewed.  Constitutional:      Appearance: She is well-developed.  Pulmonary:     Effort: Pulmonary effort is normal.  Skin:     General: Skin is warm.     Capillary Refill: Capillary refill takes less than 2 seconds.  Neurological:     Mental Status: She is alert and oriented to person, place, and time.  Psychiatric:        Behavior: Behavior normal.        Thought Content: Thought content normal.        Judgment: Judgment normal.    Ortho Exam Right shoulder exam is limited by pain and guarding.  She has diffuse pain throughout the shoulder with attempted range of motion specifically with internal rotation and forward flexion and abduction.  External rotation appears to be well-tolerated. Specialty Comments:  No specialty comments available.  Imaging: No results found.   PMFS History: Patient Active Problem List   Diagnosis Date Noted   Hip pain 12/08/2019   Hyperlipidemia associated with type 2 diabetes mellitus ([facility]) 11/23/2019   History of COVID-19 10/27/2019   Stuttering 10/27/2019   Depression with anxiety 10/27/2019   Obesity, Class III, BMI 40-49.9 (morbid obesity) ([facility]) 06/30/2019   OSA (obstructive sleep apnea) 02/16/2019   Essential hypertension 06/22/2018   Uncontrolled type 2 diabetes mellitus with complication, with long-term current use of insulin ([facility]) 12/13/2016   Asthma 07/30/2016   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/07/2016   DM neuropathy, type II diabetes mellitus ([facility]) 08/24/2015  Past Medical History:  Diagnosis Date   Asthma    COVID-19    Diabetes mellitus without complication ([facility])    GERD (gastroesophageal reflux disease)    Hypertension    Seizures ([facility])     Family History  Problem Relation Age of Onset   Asthma Mother    Kidney failure Mother    Brain cancer Mother    Asthma Father    Other Father        surgery on stomach but don't know from what   Diabetes Brother    Diabetes Maternal Grandmother     Past Surgical History:  Procedure Laterality Date   CESAREAN SECTION     HERNIA REPAIR     TUBAL LIGATION     VENTRAL HERNIA REPAIR N/A 11/27/2017   Procedure:  VENTRAL HERNIA REPAIR ERAS PATHWAY;  Surgeon: Harriette Bouillon, MD;  Location: [facility];  Service: General;  Laterality: N/A;   WISDOM TOOTH EXTRACTION     Social History   Occupational History   Not on file  Tobacco Use   Smoking status: Former    Packs/day: 0.10    Years: 2.00    Pack years: 0.20    Types: Cigarettes    Quit date: 11/27/2017    Years since quitting: 3.0   Smokeless tobacco: Never  Vaping Use   Vaping Use: Never used  Substance and Sexual Activity   Alcohol use: No   Drug use: No   Sexual activity: Yes    Birth control/protection: Surgical

## 2020-12-20 ENCOUNTER — Other Ambulatory Visit: Payer: Self-pay

## 2020-12-20 ENCOUNTER — Encounter: Payer: Self-pay | Admitting: Family Medicine

## 2020-12-20 ENCOUNTER — Ambulatory Visit: Payer: Self-pay

## 2020-12-20 ENCOUNTER — Ambulatory Visit (INDEPENDENT_AMBULATORY_CARE_PROVIDER_SITE_OTHER): Payer: Medicaid Other | Admitting: Family Medicine

## 2020-12-20 DIAGNOSIS — G8929 Other chronic pain: Secondary | ICD-10-CM | POA: Diagnosis not present

## 2020-12-20 DIAGNOSIS — M25511 Pain in right shoulder: Secondary | ICD-10-CM | POA: Diagnosis not present

## 2020-12-20 NOTE — Progress Notes (Signed)
Subjective: Patient is here for ultrasound-guided intra-articular right glenohumeral injection.  She is here for Toradol injection because she has diabetes which is poorly controlled, and the last time she had cortisone her blood sugar went to 390.  Objective: Very tender to palpation of the shoulder diffusely, with limited passive and active range of motion.  Procedure: Ultrasound guided injection is preferred based studies that show increased duration, increased effect, greater accuracy, decreased procedural pain, increased response rate, and decreased cost with ultrasound guided versus blind injection.   Verbal informed consent obtained.  Time-out conducted.  Noted no overlying erythema, induration, or other signs of local infection. Ultrasound-guided right glenohumeral injection: After sterile prep with Betadine, injected 4 cc 1% lidocaine without ep and 30 mg toradol using a 22-gauge spinal needle, passing the needle from posterior approach into the glenohumeral joint.  Injectate was seen filling the joint capsule.

## 2020-12-30 ENCOUNTER — Other Ambulatory Visit: Payer: Self-pay

## 2020-12-30 ENCOUNTER — Ambulatory Visit: Payer: Medicaid Other | Attending: Family Medicine

## 2020-12-30 DIAGNOSIS — Z3042 Encounter for surveillance of injectable contraceptive: Secondary | ICD-10-CM | POA: Diagnosis not present

## 2020-12-30 MED ORDER — MEDROXYPROGESTERONE ACETATE 150 MG/ML IM SUSP
150.0000 mg | Freq: Once | INTRAMUSCULAR | Status: AC
  Administered 2020-12-30: 150 mg via INTRAMUSCULAR

## 2020-12-30 NOTE — Progress Notes (Signed)
Pt was given depo injection in right ventragluteal. Pt tolerated shot well No urine HCG needed. Pt to return OCT 14-OCT 28

## 2021-01-02 ENCOUNTER — Other Ambulatory Visit: Payer: Self-pay

## 2021-01-06 ENCOUNTER — Telehealth: Payer: Self-pay

## 2021-01-06 ENCOUNTER — Other Ambulatory Visit: Payer: Self-pay

## 2021-01-06 DIAGNOSIS — K219 Gastro-esophageal reflux disease without esophagitis: Secondary | ICD-10-CM

## 2021-01-06 DIAGNOSIS — F418 Other specified anxiety disorders: Secondary | ICD-10-CM

## 2021-01-06 MED ORDER — PANTOPRAZOLE SODIUM 40 MG PO TBEC
DELAYED_RELEASE_TABLET | Freq: Every day | ORAL | 3 refills | Status: DC
  Filled 2021-01-06 – 2021-02-24 (×2): qty 30, 30d supply, fill #0
  Filled 2021-04-12: qty 30, 30d supply, fill #1
  Filled 2021-06-02: qty 30, 30d supply, fill #2

## 2021-01-06 NOTE — Telephone Encounter (Signed)
Pt is requesting refill on Zoloft and requesting medication for constipation.

## 2021-01-09 MED ORDER — POLYETHYLENE GLYCOL 3350 17 GM/SCOOP PO POWD
17.0000 g | Freq: Every day | ORAL | 1 refills | Status: DC
  Filled 2021-01-09: qty 238, 14d supply, fill #0

## 2021-01-09 MED ORDER — SERTRALINE HCL 100 MG PO TABS
ORAL_TABLET | Freq: Every day | ORAL | 3 refills | Status: DC
  Filled 2021-01-09 – 2021-02-24 (×2): qty 30, 30d supply, fill #0
  Filled 2021-04-12: qty 30, 30d supply, fill #1

## 2021-01-09 NOTE — Telephone Encounter (Signed)
Done

## 2021-01-09 NOTE — Addendum Note (Signed)
Addended by: Hoy Register on: 01/09/2021 05:51 PM   Modules accepted: Orders

## 2021-01-10 ENCOUNTER — Other Ambulatory Visit: Payer: Self-pay

## 2021-01-10 ENCOUNTER — Telehealth: Payer: Self-pay | Admitting: Orthopaedic Surgery

## 2021-01-10 NOTE — Telephone Encounter (Signed)
Records refaxed to [facility] [employer] [phone]

## 2021-01-12 ENCOUNTER — Telehealth: Payer: Self-pay

## 2021-01-12 NOTE — Telephone Encounter (Signed)
Pt needs appointment for boil other medications has been sent

## 2021-01-12 NOTE — Telephone Encounter (Signed)
Copied from CRM [id number]. Topic: General - Inquiry >> Jan 12, 2021  9:23 AM Daphine Deutscher D wrote: Reason for CRM: Pt requested a call or medication for the following,  Heart burn, consitpation and she says she has a boil under her arm and needs advise.  [phone]   Spoke with Ms. Hirschfeld she stated she spoke with Alycia at her last Depo appt in July. Stated a prescription was supposed to be sent for the constipation and heart burn then. She also wanted to know if something can be sent for the boil as well. Do she need to be set up for an appointment to address the boil?

## 2021-01-13 ENCOUNTER — Other Ambulatory Visit: Payer: Self-pay

## 2021-01-13 NOTE — Telephone Encounter (Signed)
Called patient no answer and left vm that medication was sent. Also advised patient to call [phone] to schedule appointment to discuss boil

## 2021-01-13 NOTE — Telephone Encounter (Signed)
Patient would like to know if there's any cancellations prior to September (preferable on Tuesday's or Thursday's), please reach out, patient has been placed on the wait list. Also informed patient about the mobile unit bus being available this Saturday 01/13/2021

## 2021-01-17 ENCOUNTER — Other Ambulatory Visit: Payer: Self-pay

## 2021-02-17 ENCOUNTER — Other Ambulatory Visit: Payer: Self-pay

## 2021-02-17 MED ORDER — INSULIN ASPART 100 UNIT/ML IJ SOLN
INTRAMUSCULAR | 5 refills | Status: DC
  Filled 2021-02-17: qty 90, 30d supply, fill #0
  Filled 2021-04-12: qty 90, 30d supply, fill #1
  Filled 2021-05-23: qty 90, 30d supply, fill #2
  Filled 2021-08-08: qty 90, 30d supply, fill #0

## 2021-02-17 MED ORDER — DEXCOM G6 SENSOR MISC
0 refills | Status: DC
  Filled 2021-02-17: qty 3, 30d supply, fill #0

## 2021-02-20 ENCOUNTER — Other Ambulatory Visit: Payer: Self-pay

## 2021-02-21 ENCOUNTER — Other Ambulatory Visit: Payer: Self-pay

## 2021-02-21 ENCOUNTER — Encounter (HOSPITAL_COMMUNITY): Payer: Self-pay | Admitting: Emergency Medicine

## 2021-02-21 ENCOUNTER — Ambulatory Visit (HOSPITAL_COMMUNITY)
Admission: EM | Admit: 2021-02-21 | Discharge: 2021-02-21 | Disposition: A | Discharge status: Home / Self Care | Payer: Medicaid Other | Attending: Internal Medicine | Admitting: Internal Medicine

## 2021-02-21 DIAGNOSIS — A084 Viral intestinal infection, unspecified: Secondary | ICD-10-CM

## 2021-02-21 MED ORDER — ONDANSETRON 4 MG PO TBDP
4.0000 mg | ORAL_TABLET | Freq: Once | ORAL | Status: AC
  Administered 2021-02-21: 4 mg via ORAL

## 2021-02-21 MED ORDER — ONDANSETRON 8 MG PO TBDP
8.0000 mg | ORAL_TABLET | Freq: Three times a day (TID) | ORAL | 0 refills | Status: DC | PRN
  Filled 2021-02-21: qty 20, 7d supply, fill #0

## 2021-02-21 MED ORDER — ONDANSETRON 4 MG PO TBDP
ORAL_TABLET | ORAL | Status: AC
  Filled 2021-02-21: qty 1

## 2021-02-21 NOTE — Discharge Instructions (Addendum)
-  Take the Zofran (ondansetron) up to 3 times daily for nausea and vomiting. Dissolve one pill under your tongue or between your teeth and your cheek. -Lots of fluids like water and gatorade -You can take Tylenol up to 1000 mg 3 times daily, and ibuprofen up to 600 mg 3 times daily with food.  You can take these together, or alternate every 3-4 hours. -With a virus, you're typically contagious for 5-7 days, or as long as you're having fevers.

## 2021-02-21 NOTE — ED Triage Notes (Addendum)
Onset Saturday of abdominal pain that has increased over the time.  Yesterday patient started vomiting.  Reports temp 101 yesterday.  No diarrhea.  Patient has vomited today, patient is nauseated  Dexcom reading 120 this morning per patient

## 2021-02-21 NOTE — ED Provider Notes (Signed)
[facility]    CSN: [id number] Arrival date & time: 02/21/21  2505      History   Chief Complaint Chief Complaint  Patient presents with   Abdominal Pain    HPI Sarah Garrison is a 40 y.o. female presenting with stomach issue and fevers.  Medical history COVID, diabetes.  States that she has had about 4 days of epigastric pain and nausea with few episodes of vomiting.  Fevers as high as 101 1 day ago, last ibuprofen was 16 hours ago.  Patient is only vomited once today.  Denies diarrhea, but she is still having bowel movements and passing gas.  Denies cough.  Initially with sore throat, but no longer.  Denies recent travel or sick contacts.  HPI  Past Medical History:  Diagnosis Date   Asthma    COVID-19    Diabetes mellitus without complication (HCC)    GERD (gastroesophageal reflux disease)    Hypertension    Seizures (HCC)     Patient Active Problem List   Diagnosis Date Noted   Hip pain 12/08/2019   Hyperlipidemia associated with type 2 diabetes mellitus (HCC) 11/23/2019   History of COVID-19 10/27/2019   Stuttering 10/27/2019   Depression with anxiety 10/27/2019   Obesity, Class III, BMI 40-49.9 (morbid obesity) (HCC) 06/30/2019   OSA (obstructive sleep apnea) 02/16/2019   Essential hypertension 06/22/2018   Uncontrolled type 2 diabetes mellitus with complication, with long-term current use of insulin (HCC) 12/13/2016   Asthma 07/30/2016   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/07/2016   DM neuropathy, type II diabetes mellitus (HCC) 08/24/2015    Past Surgical History:  Procedure Laterality Date   CESAREAN SECTION     HERNIA REPAIR     TUBAL LIGATION     VENTRAL HERNIA REPAIR N/A 11/27/2017   Procedure: VENTRAL HERNIA REPAIR ERAS PATHWAY;  Surgeon: Harriette Bouillon, MD;  Location: [facility];  Service: General;  Laterality: N/A;   WISDOM TOOTH EXTRACTION      OB History     Gravida  5   Para  4   Term      Preterm      AB  1    Living  4      SAB  1   IAB      Ectopic      Multiple      Live Births               Home Medications    Prior to Admission medications   Medication Sig Start Date End Date Taking? Authorizing Provider  ondansetron (ZOFRAN ODT) 8 MG disintegrating tablet Take 1 tablet (8 mg total) by mouth every 8 (eight) hours as needed for nausea or vomiting. 02/21/21  Yes Rhys Martini, PA-C  albuterol (VENTOLIN HFA) 108 (90 Base) MCG/ACT inhaler Inhale 1-2 puffs into the lungs every 6 (six) hours as needed for wheezing or shortness of breath. Patient not taking: Reported on 02/21/2021 01/01/19   Storm Frisk, MD  atorvastatin (LIPITOR) 40 MG tablet Take 1 tablet (40 mg total) by mouth daily. 10/27/20   Hoy Register, MD  cephALEXin (KEFLEX) 500 MG capsule Take 1 capsule (500 mg total) by mouth 2 (two) times daily. 10/24/20   Hoy Register, MD  cetirizine (ZYRTEC) 10 MG tablet TAKE 1 TABLET (10 MG TOTAL) BY MOUTH DAILY. 08/08/20 08/08/21  Particia Nearing, PA-C  Continuous Blood Gluc Receiver (DEXCOM G6 RECEIVER) DEVI 1 each by Misc.(Non-Drug; Combo Route)  route daily. 02/29/20   [provider]  Continuous Blood Gluc Sensor (DEXCOM G6 SENSOR) MISC SMARTSIG:1 Topical Every 10 Days 12/03/19   [provider]  Continuous Blood Gluc Sensor (DEXCOM G6 SENSOR) MISC Use to continuously monitor glucose.  Change every 10 days 02/16/21     Continuous Blood Gluc Transmit (DEXCOM G6 TRANSMITTER) MISC USE TO CHECK BLOOD SUGAR EVERY 3 MONTHS 10/16/19   [provider]  fluticasone (FLONASE) 50 MCG/ACT nasal spray PLACE 1 SPRAY INTO BOTH NOSTRILS IN THE MORNING AND AT BEDTIME. 08/08/20 08/08/21  Particia Nearing, PA-C  fluticasone-salmeterol (ADVAIR [facility]) 115-21 MCG/ACT inhaler Inhale 2 puffs into the lungs 2 (two) times daily. Patient not taking: Reported on 02/21/2021 11/23/19   Storm Frisk, MD  Fluticasone-Salmeterol (ADVAIR) 100-50 MCG/DOSE AEPB Inhale 1 puff into the  lungs 2 (two) times daily. Patient not taking: Reported on 02/21/2021    [provider]  insulin aspart (NOVOLOG) 100 UNIT/ML injection use via insulin pump. max total daily dose of 300 units 09/23/20     insulin aspart (NOVOLOG) 100 UNIT/ML injection USE VIA INSULIN PUMP. MAX TOTAL DAILY DOSE OF 300 UNITS 02/17/21     insulin degludec (TRESIBA FLEXTOUCH) 100 UNIT/ML FlexTouch Pen Inject 60 units once a day as needed in setting of pump failure 09/23/20     insulin degludec (TRESIBA) 100 UNIT/ML FlexTouch Pen Inject into the skin. 01/08/20   [provider]  Insulin Human (INSULIN PUMP) SOLN Inject 1 each into the skin See admin instructions. Medication: Novolog 100 units/ml injection. Takes 100 units via pump per patient.    [provider]  Insulin Pen Needle 32G X 4 MM MISC USE TO ADMINISTER INSULIN DAILY 04/13/20 04/13/21  Gwynneth Macleod, MD  Insulin Syringe-Needle U-100 31G X 5/16" 1 ML MISC inject 10 units into the skin 3 times daily. use to inject novolog based on insulin to card ratio- max daily dose 150 units 09/23/20     Lactulose 20 GM/30ML SOLN Take 30 mLs (20 g total) by mouth at bedtime. For 3 nights then as needed for constipation 10/23/19   Fulp, Cammie, MD  lidocaine (XYLOCAINE) 2 % solution USE AS DIRECTED 10 MLS IN THE MOUTH OR THROAT AS NEEDED FOR MOUTH PAIN. Patient not taking: Reported on 02/21/2021 08/08/20 08/08/21  Particia Nearing, PA-C  lisinopril (ZESTRIL) 20 MG tablet TAKE 1 TABLET (20 MG TOTAL) BY MOUTH DAILY. 08/05/20 08/05/21  Maggie Font, FNP  losartan (COZAAR) 50 MG tablet Take 1 tablet (50 mg total) by mouth daily. 11/23/19   Storm Frisk, MD  meloxicam (MOBIC) 7.5 MG tablet Take 1 tablet (7.5 mg total) by mouth 2 (two) times daily as needed for up to 14 doses for pain. Patient not taking: Reported on 02/21/2021 10/18/20   Cristie Hem, PA-C  methocarbamol (ROBAXIN) 500 MG tablet Take 1 tablet (500 mg total) by mouth 2 (two) times  daily as needed. Patient not taking: Reported on 02/21/2021 10/18/20   Cristie Hem, PA-C  Misc. Devices MISC CPAP therapy on autopap 5-15.  Needs Small size [employer] Full Face Mask Simplus  mask and heated humidification. 09/13/19   Claiborne Rigg, NP  neomycin-polymyxin-hydrocortisone (CORTISPORIN) OTIC solution APPLY 1-2 DROPS TO TOENAIL TWICE A DAY AND COVER WITH BANDAID AS NEEDED Patient not taking: Reported on 02/21/2021 04/07/20 04/07/21  Asencion Islam, DPM  nystatin (MYCOSTATIN) 100000 UNIT/ML suspension Take 5 mLs by mouth 4 (four) times daily. Patient not taking: Reported  on 02/21/2021 10/13/19   [provider]  pantoprazole (PROTONIX) 40 MG tablet TAKE 1 TABLET (40 MG TOTAL) BY MOUTH DAILY. 01/06/21 01/06/22  Hoy Register, MD  polyethylene glycol powder (GLYCOLAX/MIRALAX) 17 GM/SCOOP powder Take 17 g by mouth daily. 01/09/21   Hoy Register, MD  sertraline (ZOLOFT) 100 MG tablet TAKE 1 TABLET (100 MG TOTAL) BY MOUTH DAILY. Patient not taking: Reported on 02/21/2021 01/09/21 01/09/22  Hoy Register, MD  sitaGLIPtin (JANUVIA) 100 MG tablet Take by mouth. Patient not taking: Reported on 02/21/2021 02/05/20   [provider]  TRUEPLUS PEN NEEDLES 32G X 4 MM MISC USE 3 TIMES DAILY AS DIRECTED 06/16/18   Hoy Register, MD  VITAMIN D PO Take 1 tablet by mouth 2 (two) times daily. Patient not taking: Reported on 02/21/2021    [provider]  ARIPiprazole (ABILIFY) 5 MG tablet Take 1 tablet (5 mg total) by mouth daily. 10/27/19 06/13/20  Sater, Pearletha Furl, MD    Family History Family History  Problem Relation Age of Onset   Asthma Mother    Kidney failure Mother    Brain cancer Mother    Asthma Father    Other Father        surgery on stomach but don't know from what   Diabetes Brother    Diabetes Maternal Grandmother     Social History Social History   Tobacco Use   Smoking status: Former    Packs/day: 0.10    Years: 2.00    Pack years: 0.20     Types: Cigarettes    Quit date: 11/27/2017    Years since quitting: 3.2   Smokeless tobacco: Never  Vaping Use   Vaping Use: Some days  Substance Use Topics   Alcohol use: No   Drug use: No     Allergies   Morphine and related, Glyburide, Ivp dye [iodinated diagnostic agents], Oxycodone, and Vicodin [hydrocodone-acetaminophen]   Review of Systems Review of Systems  Constitutional:  Positive for chills and fever. Negative for appetite change.  HENT:  Negative for congestion, ear pain, rhinorrhea, sinus pressure, sinus pain and sore throat.   Eyes:  Negative for redness and visual disturbance.  Respiratory:  Negative for cough, chest tightness, shortness of breath and wheezing.   Cardiovascular:  Negative for chest pain and palpitations.  Gastrointestinal:  Positive for abdominal pain, nausea and vomiting. Negative for constipation and diarrhea.  Genitourinary:  Negative for dysuria, frequency and urgency.  Musculoskeletal:  Negative for myalgias.  Neurological:  Negative for dizziness, weakness and headaches.  Psychiatric/Behavioral:  Negative for confusion.   All other systems reviewed and are negative.   Physical Exam Triage Vital Signs ED Triage Vitals  Enc Vitals Group     BP 02/21/21 0823 (!) 138/98     Pulse Rate 02/21/21 0823 99     Resp 02/21/21 0823 20     Temp 02/21/21 0823 98.9 F (37.2 C)     Temp Source 02/21/21 0823 Oral     SpO2 02/21/21 0823 97 %     Weight --      Height --      Head Circumference --      Peak Flow --      Pain Score 02/21/21 0818 8     Pain Loc --      Pain Edu? --      Excl. in GC? --    No data found.  Updated Vital Signs BP (!) 138/98 (BP Location: Right  Arm)   Pulse 99   Temp 98.9 F (37.2 C) (Oral)   Resp 20   SpO2 97%   Visual Acuity Right Eye Distance:   Left Eye Distance:   Bilateral Distance:    Right Eye Near:   Left Eye Near:    Bilateral Near:     Physical Exam Vitals reviewed.  Constitutional:       General: She is not in acute distress.    Appearance: Normal appearance. She is not ill-appearing.  HENT:     Head: Normocephalic and atraumatic.     Mouth/Throat:     Mouth: Mucous membranes are moist.     Pharynx: No posterior oropharyngeal erythema.     Tonsils: No tonsillar exudate.     Comments: Moist mucous membranes On exam, uvula is midline, she is tolerating her secretions without difficulty, there is no trismus, no drooling, she has normal phonation  Eyes:     Extraocular Movements: Extraocular movements intact.     Pupils: Pupils are equal, round, and reactive to light.  Cardiovascular:     Rate and Rhythm: Normal rate and regular rhythm.     Heart sounds: Normal heart sounds.  Pulmonary:     Effort: Pulmonary effort is normal.     Breath sounds: Normal breath sounds. No wheezing, rhonchi or rales.  Abdominal:     General: Bowel sounds are normal. There is no distension.     Palpations: Abdomen is soft. There is no mass.     Tenderness: There is abdominal tenderness in the epigastric area. There is no right CVA tenderness, left CVA tenderness, guarding or rebound. Negative signs include Murphy's sign, Rovsing's sign and McBurney's sign.     Comments: Epigastric tenderness without rebound or guarding.  Lymphadenopathy:     Cervical: No cervical adenopathy.     Right cervical: No superficial cervical adenopathy.    Left cervical: No superficial cervical adenopathy.  Skin:    General: Skin is warm.     Capillary Refill: Capillary refill takes 2 to 3 seconds.     Comments: Good skin turgor  Neurological:     General: No focal deficit present.     Mental Status: She is alert and oriented to person, place, and time.  Psychiatric:        Mood and Affect: Mood normal.        Behavior: Behavior normal.     UC Treatments / Results  Labs (all labs ordered are listed, but only abnormal results are displayed) Labs Reviewed - No data to display  EKG   Radiology No  results found.  Procedures Procedures (including critical care time)  Medications Ordered in UC Medications  ondansetron (ZOFRAN-ODT) disintegrating tablet 4 mg (4 mg Oral Given 02/21/21 5852)    Initial Impression / Assessment and Plan / UC Course  I have reviewed the triage vital signs and the nursing notes.  Pertinent labs & imaging results that were available during my care of the patient were reviewed by me and considered in my medical decision making (see chart for details).     This patient is a very pleasant 40 y.o. year old female presenting with viral gastroenteritis. Today this pt is afebrile nontachycardic nontachypneic, appears fairly well hydrated.   Declines COVID PCR Given duration of symptoms will defer influenza testing Centor score +1, patient adamantly declines strep test.   Zofran ODT, rest, BRAT diet, good hydration .  ED return precautions discussed. Patient verbalizes understanding and agreement.  Final Clinical Impressions(s) / UC Diagnoses   Final diagnoses:  Viral gastroenteritis     Discharge Instructions      -Take the Zofran (ondansetron) up to 3 times daily for nausea and vomiting. Dissolve one pill under your tongue or between your teeth and your cheek. -Lots of fluids like water and gatorade -You can take Tylenol up to 1000 mg 3 times daily, and ibuprofen up to 600 mg 3 times daily with food.  You can take these together, or alternate every 3-4 hours. -With a virus, you're typically contagious for 5-7 days, or as long as you're having fevers.      ED Prescriptions     Medication Sig Dispense Auth. Provider   ondansetron (ZOFRAN ODT) 8 MG disintegrating tablet Take 1 tablet (8 mg total) by mouth every 8 (eight) hours as needed for nausea or vomiting. 20 tablet Rhys Martini, PA-C      PDMP not reviewed this encounter.   Rhys Martini, PA-C 02/21/21 [phone]

## 2021-02-24 ENCOUNTER — Other Ambulatory Visit: Payer: Self-pay

## 2021-02-24 MED ORDER — LISINOPRIL 20 MG PO TABS
20.0000 mg | ORAL_TABLET | Freq: Every day | ORAL | 2 refills | Status: DC
  Filled 2021-02-24: qty 90, 90d supply, fill #0
  Filled 2021-06-02: qty 90, 90d supply, fill #1

## 2021-02-24 MED ORDER — TOUJEO SOLOSTAR 300 UNIT/ML ~~LOC~~ SOPN
PEN_INJECTOR | SUBCUTANEOUS | 1 refills | Status: DC
  Filled 2021-02-24: qty 4.5, 27d supply, fill #0

## 2021-02-24 MED ORDER — ATORVASTATIN CALCIUM 20 MG PO TABS
20.0000 mg | ORAL_TABLET | Freq: Every day | ORAL | 1 refills | Status: DC
  Filled 2021-02-24: qty 90, 90d supply, fill #0

## 2021-02-24 MED ORDER — INSULIN ASPART 100 UNIT/ML IJ SOLN
INTRAMUSCULAR | 5 refills | Status: DC
  Filled 2021-02-24: qty 30, 30d supply, fill #0
  Filled 2021-10-10: qty 20, 6d supply, fill #0
  Filled 2021-10-10: qty 70, 24d supply, fill #0

## 2021-03-01 ENCOUNTER — Other Ambulatory Visit: Payer: Self-pay

## 2021-03-08 ENCOUNTER — Other Ambulatory Visit (HOSPITAL_BASED_OUTPATIENT_CLINIC_OR_DEPARTMENT_OTHER): Payer: Self-pay

## 2021-03-08 ENCOUNTER — Other Ambulatory Visit: Payer: Self-pay

## 2021-03-16 ENCOUNTER — Ambulatory Visit (INDEPENDENT_AMBULATORY_CARE_PROVIDER_SITE_OTHER): Payer: Medicaid Other

## 2021-03-16 ENCOUNTER — Other Ambulatory Visit: Payer: Self-pay

## 2021-03-16 ENCOUNTER — Ambulatory Visit: Payer: Medicaid Other | Admitting: Podiatrist

## 2021-03-16 ENCOUNTER — Encounter: Payer: Self-pay | Admitting: Sports Medicine

## 2021-03-16 DIAGNOSIS — M729 Fibroblastic disorder, unspecified: Secondary | ICD-10-CM | POA: Diagnosis not present

## 2021-03-16 DIAGNOSIS — E1140 Type 2 diabetes mellitus with diabetic neuropathy, unspecified: Secondary | ICD-10-CM | POA: Diagnosis not present

## 2021-03-16 DIAGNOSIS — Z794 Long term (current) use of insulin: Secondary | ICD-10-CM | POA: Diagnosis not present

## 2021-03-16 DIAGNOSIS — M79676 Pain in unspecified toe(s): Secondary | ICD-10-CM

## 2021-03-16 DIAGNOSIS — M722 Plantar fascial fibromatosis: Secondary | ICD-10-CM

## 2021-03-16 NOTE — Progress Notes (Signed)
Chief Complaint  Patient presents with   Pain    Rt hallux and bottom heel pain x 1 mo - worse when working - w/ numbness and tingling at hallux - no injury ; 10/10 hsarp pains - w/ swelling tx: heat pads, icing and massage   Diabetes    Fbs: 120 a1c: 7 CPP: Xeldon x months     HPI: Patient is 40 y.o. female who presents today for pain in the right great toe joint as well as continued heel pain left heel.  She relates pain that is worse when working.  She denies any recent injury to the right foot.  She is diabetic and relates recent A1C of 7.     Allergies  Allergen Reactions   Morphine And Related Shortness Of Breath   Glyburide Diarrhea and Other (See Comments)    Reaction:  Nose bleeds    Ivp Dye [Iodinated Diagnostic Agents] Other (See Comments)    Shortness of breath.     Oxycodone Other (See Comments)    Pt states that this medication makes her "dream about rabbits chasing" her.     Vicodin [Hydrocodone-Acetaminophen] Other (See Comments)    Pt states that this medication makes her "dream about rabbits chasing" her.      Review of systems is negative except as noted in the HPI.  Denies nausea/ vomiting/ fevers/ chills or night sweats.   Denies difficulty breathing, denies calf pain or tenderness  Physical Exam  Patient is awake, alert, and oriented x 3.  In no acute distress.    Vascular status is intact with palpable pedal pulses DP and PT bilateral and capillary refill time less than 3 seconds bilateral.  No edema or erythema noted.   Neurological exam reveals epicritic and protective sensation grossly intact bilateral.   Dermatological exam reveals skin is supple and dry to bilateral feet.  No open lesions present.    Musculoskeletal exam: pain at the right great toe joint with dorsiflexion and plantarflexion.  No crepitus, no decrease in range of motion noted.  Moderate tenderness to palpation left heel at the insertion of the plantar fascia on the medial calcaneal  tubercle.  Decrease ankle joint dorsiflexion noted bilateral.  Pes planus foot type noted bilateral.     Xrays reveal no acute boney abnormalities.  No sign of fracture of dislocation noted.  First metatarsal phalangeal joint appears normal- no arthritic changes seen in or around the joint.  No swelling noted.   Assessment: 1. Complaint of pain of great toe   2. Plantar fasciitis, left   3. Type 2 diabetes mellitus with diabetic neuropathy, with long-term current use of insulin (HCC)      Plan:  Discussed treatment options for her pain. Discussed trying neurontin and she relates she tried this in the past and it did not help.  I also discussed trying a boot for her plantar fasciitis so she can wear this at work.  I wrote her a prescription to obtain the boot at [employer].  She also asked for a note to be out of work so I did agree and she will be out for a few days.  She will see Dr. Marylene Land for follow up.     Rx for boot  Lyrica trial  See Dr. Marylene Land for surgery.

## 2021-03-17 ENCOUNTER — Telehealth: Payer: Self-pay | Admitting: Podiatry

## 2021-03-17 ENCOUNTER — Other Ambulatory Visit: Payer: Self-pay

## 2021-03-17 ENCOUNTER — Ambulatory Visit: Payer: Medicaid Other | Attending: Family Medicine

## 2021-03-17 DIAGNOSIS — Z3042 Encounter for surveillance of injectable contraceptive: Secondary | ICD-10-CM | POA: Diagnosis not present

## 2021-03-17 MED ORDER — TRAMADOL HCL 50 MG PO TABS: 50.0000 mg | ORAL_TABLET | Freq: Three times a day (TID) | ORAL | 0 refills | Status: AC | PRN

## 2021-03-17 MED ORDER — MEDROXYPROGESTERONE ACETATE 150 MG/ML IM SUSP
150.0000 mg | Freq: Once | INTRAMUSCULAR | Status: AC
Start: 2021-03-17 — End: 2021-03-17
  Administered 2021-03-17: 150 mg via INTRAMUSCULAR

## 2021-03-17 NOTE — Telephone Encounter (Signed)
Pt called office to request a refill of pain medication.

## 2021-03-17 NOTE — Progress Notes (Signed)
Pt was given depo injection in right ventragluteal. Pt tolerated shot well No urine HCG needed. Pt to return DEC 30-JAN-13

## 2021-03-17 NOTE — Addendum Note (Signed)
Addended by: Nicholes Rough on: 03/17/2021 09:44 AM   Modules accepted: Orders

## 2021-03-20 ENCOUNTER — Encounter: Payer: Self-pay | Admitting: Podiatrist

## 2021-03-23 ENCOUNTER — Other Ambulatory Visit: Payer: Self-pay

## 2021-03-23 ENCOUNTER — Ambulatory Visit (INDEPENDENT_AMBULATORY_CARE_PROVIDER_SITE_OTHER): Payer: Medicaid Other | Admitting: Sports Medicine

## 2021-03-23 DIAGNOSIS — M722 Plantar fascial fibromatosis: Secondary | ICD-10-CM

## 2021-03-23 DIAGNOSIS — M79671 Pain in right foot: Secondary | ICD-10-CM

## 2021-03-23 DIAGNOSIS — Z794 Long term (current) use of insulin: Secondary | ICD-10-CM

## 2021-03-23 DIAGNOSIS — E1140 Type 2 diabetes mellitus with diabetic neuropathy, unspecified: Secondary | ICD-10-CM

## 2021-03-23 MED ORDER — PREGABALIN 75 MG PO CAPS
75.0000 mg | ORAL_CAPSULE | Freq: Two times a day (BID) | ORAL | 2 refills | Status: DC
  Filled 2021-03-23: qty 60, 30d supply, fill #0

## 2021-03-23 NOTE — Patient Instructions (Signed)
Shoe List ?For tennis shoes recommend:  ?Brooks Beast ?Asics ?New balance ?Saucony ?HOKA ?Can be purchased at [employer] or [employer] ?Sketchers arch fit ?Can be purchased at any major retailers ? ?Vionic  ?SAS ?Can be purchased at [employer] or [employer]  ? ?For work shoes recommend: ?Sketchers Work ?Timberland boots  ?Redwing boots ?Can be purchased at a variety of places or [employer]  ? ?For casual shoes recommend: ?Oofos ?Can be purchased at [employer] or [employer] ?Vionic  ?Can be purchased at [employer] or [employer]  ? ?Orthotics  ?For Over-the-Counter Orthotics recommend: ?Power Steps ?Can be purchased in office/[facility] ?Superfeet ?Can be purchased at [employer] or [employer] ?Airplus ?Can be purchased at [employer] ?For Custom Orthotics recommend: ?Custom fitting/Appointment at [facility] ? ?

## 2021-03-23 NOTE — Progress Notes (Signed)
Subjective: Sarah Garrison is a 40 y.o. female returns to office for follow up evaluation of R>L heel pain. Patient reports that her feet hurt really bad. States that there is a lot of burning pain to both feet and pain is bad at the heels hard to walk/stand. Patient reports that she got her A1c down to 7 and wants to discuss surgery.   Patient Active Problem List   Diagnosis Date Noted   Hip pain 12/08/2019   Hyperlipidemia associated with type 2 diabetes mellitus (HCC) 11/23/2019   History of COVID-19 10/27/2019   Stuttering 10/27/2019   Depression with anxiety 10/27/2019   Obesity, Class III, BMI 40-49.9 (morbid obesity) (HCC) 06/30/2019   OSA (obstructive sleep apnea) 02/16/2019   Essential hypertension 06/22/2018   Uncontrolled type 2 diabetes mellitus with complication, with long-term current use of insulin 12/13/2016   Asthma 07/30/2016   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/07/2016   DM neuropathy, type II diabetes mellitus (HCC) 08/24/2015    Current Outpatient Medications on File Prior to Visit  Medication Sig Dispense Refill   albuterol (VENTOLIN HFA) 108 (90 Base) MCG/ACT inhaler Inhale 1-2 puffs into the lungs every 6 (six) hours as needed for wheezing or shortness of breath. 18 g 1   atorvastatin (LIPITOR) 20 MG tablet Take 1 tablet (20 mg total) by mouth daily. 90 tablet 1   atorvastatin (LIPITOR) 20 MG tablet Take 1 tablet by mouth daily.     atorvastatin (LIPITOR) 40 MG tablet Take 1 tablet (40 mg total) by mouth daily. 90 tablet 1   cephALEXin (KEFLEX) 500 MG capsule Take 1 capsule (500 mg total) by mouth 2 (two) times daily. 14 capsule 0   cetirizine (ZYRTEC) 10 MG tablet TAKE 1 TABLET (10 MG TOTAL) BY MOUTH DAILY. 30 tablet 2   Continuous Blood Gluc Receiver (DEXCOM G6 RECEIVER) DEVI 1 each by Misc.(Non-Drug; Combo Route) route daily.     Continuous Blood Gluc Sensor (DEXCOM G6 SENSOR) MISC SMARTSIG:1 Topical Every 10 Days     Continuous Blood Gluc Sensor (DEXCOM G6 SENSOR) MISC  Use to continuously monitor glucose.  Change every 10 days 3 each 0   Continuous Blood Gluc Transmit (DEXCOM G6 TRANSMITTER) MISC USE TO CHECK BLOOD SUGAR EVERY 3 MONTHS     fluticasone (FLONASE) 50 MCG/ACT nasal spray PLACE 1 SPRAY INTO BOTH NOSTRILS IN THE MORNING AND AT BEDTIME. 16 g 2   fluticasone-salmeterol (ADVAIR HFA) 115-21 MCG/ACT inhaler Inhale 2 puffs into the lungs 2 (two) times daily. 1 Inhaler 12   Fluticasone-Salmeterol (ADVAIR) 100-50 MCG/DOSE AEPB Inhale 1 puff into the lungs 2 (two) times daily.     insulin aspart (NOVOLOG) 100 UNIT/ML injection use via insulin pump. max total daily dose of 300 units 530 mL 0   insulin aspart (NOVOLOG) 100 UNIT/ML injection USE VIA INSULIN PUMP. MAX TOTAL DAILY DOSE OF 300 UNITS 90 mL 5   insulin aspart (NOVOLOG) 100 UNIT/ML injection USE VIA INSULIN PUMP. MAX TOTAL DAILY DOSE OF 300 UNITS 90 mL 5   insulin degludec (TRESIBA FLEXTOUCH) 100 UNIT/ML FlexTouch Pen Inject 60 units once a day as needed in setting of pump failure 30 mL 2   insulin degludec (TRESIBA) 100 UNIT/ML FlexTouch Pen Inject into the skin.     insulin glargine, 1 Unit Dial, (TOUJEO SOLOSTAR) 300 UNIT/ML Solostar Pen Administer 50 units once daily only if insulin pump malfunction 9 mL 1   Insulin Human (INSULIN PUMP) SOLN Inject 1 each into the skin See admin instructions.  Medication: Novolog 100 units/ml injection. Takes 100 units via pump per patient.     Insulin Pen Needle 32G X 4 MM MISC USE TO ADMINISTER INSULIN DAILY 100 each 5   Insulin Syringe-Needle U-100 31G X 5/16" 1 ML MISC inject 10 units into the skin 3 times daily. use to inject novolog based on insulin to card ratio- max daily dose 150 units 100 each 2   Lactulose 20 GM/30ML SOLN Take 30 mLs (20 g total) by mouth at bedtime. For 3 nights then as needed for constipation 450 mL 0   lidocaine (XYLOCAINE) 2 % solution USE AS DIRECTED 10 MLS IN THE MOUTH OR THROAT AS NEEDED FOR MOUTH PAIN. 100 mL 0   lisinopril (ZESTRIL)  20 MG tablet TAKE 1 TABLET (20 MG TOTAL) BY MOUTH DAILY. 90 tablet 2   lisinopril (ZESTRIL) 20 MG tablet Take 1 tablet (20 mg total) by mouth daily. 90 tablet 2   lisinopril (ZESTRIL) 20 MG tablet Take 1 tablet by mouth daily.     losartan (COZAAR) 50 MG tablet Take 1 tablet (50 mg total) by mouth daily. 90 tablet 3   meloxicam (MOBIC) 7.5 MG tablet Take 1 tablet (7.5 mg total) by mouth 2 (two) times daily as needed for up to 14 doses for pain. 30 tablet 2   methocarbamol (ROBAXIN) 500 MG tablet Take 1 tablet (500 mg total) by mouth 2 (two) times daily as needed. 20 tablet 0   Misc. Devices MISC CPAP therapy on autopap 5-15.  Needs Small size [employer]kel Full Face Mask Simplus  mask and heated humidification. 1 each 0   neomycin-polymyxin-hydrocortisone (CORTISPORIN) OTIC solution APPLY 1-2 DROPS TO TOENAIL TWICE A DAY AND COVER WITH BANDAID AS NEEDED 10 mL 0   nystatin (MYCOSTATIN) 100000 UNIT/ML suspension Take 5 mLs by mouth 4 (four) times daily.     ondansetron (ZOFRAN ODT) 8 MG disintegrating tablet Take 1 tablet (8 mg total) by mouth every 8 (eight) hours as needed for nausea or vomiting. 20 tablet 0   pantoprazole (PROTONIX) 40 MG tablet TAKE 1 TABLET (40 MG TOTAL) BY MOUTH DAILY. 30 tablet 3   polyethylene glycol powder (GLYCOLAX/MIRALAX) 17 GM/SCOOP powder Take 17 g by mouth daily. 3350 g 1   sertraline (ZOLOFT) 100 MG tablet TAKE 1 TABLET (100 MG TOTAL) BY MOUTH DAILY. 30 tablet 3   sitaGLIPtin (JANUVIA) 100 MG tablet Take by mouth.     Syringe/Needle, Disp, (SYRINGE 3CC/21GX1") 21G X 1" 3 ML MISC Use to fill pump cartridge with insulin as directed.     TRUEPLUS PEN NEEDLES 32G X 4 MM MISC USE 3 TIMES DAILY AS DIRECTED 100 each 5   VITAMIN D PO Take 1 tablet by mouth 2 (two) times daily.     [DISCONTINUED] ARIPiprazole (ABILIFY) 5 MG tablet Take 1 tablet (5 mg total) by mouth daily. 30 tablet 2   No current facility-administered medications on file prior to visit.    Allergies   Allergen Reactions   Morphine And Related Shortness Of Breath   Glyburide Diarrhea and Other (See Comments)    Reaction:  Nose bleeds    Ivp Dye [Iodinated Diagnostic Agents] Other (See Comments)    Shortness of breath.     Oxycodone Other (See Comments)    Pt states that this medication makes her "dream about rabbits chasing" her.     Vicodin [Hydrocodone-Acetaminophen] Other (See Comments)    Pt states that this medication makes her "dream about rabbits chasing" her.  Objective:   General:  Alert and oriented x 3, in no acute distress  Dermatology: Skin is warm, dry, and supple bilateral. Nails are within normal limits. There is no lower extremity erythema, no eccymosis, no open lesions present bilateral.   Vascular: Dorsalis Pedis and Posterior Tibial pedal pulses are 1/4 bilateral. + hair growth noted bilateral. Capillary Fill Time is 3 seconds in all digits. No varicosities, No edema bilateral lower extremities.   Neurological: Sensation grossly intact to light touch subjective burning and stinging sensation to both feet.  Musculoskeletal: There is  tenderness to palpation at the medial calcaneal tubercale and through the insertion of the plantar fascia on the R>L foot. No pain with compression to calcaneus or application of tuning fork. There is decreased Ankle joint range of motion bilateral. All other jointsrange of motion  within normal limits bilateral. Strength 5/5 bilateral.   Assessment and Plan: Problem List Items Addressed This Visit       Endocrine   DM neuropathy, type II diabetes mellitus (HCC)   Relevant Medications   atorvastatin (LIPITOR) 20 MG tablet   lisinopril (ZESTRIL) 20 MG tablet   Other Visit Diagnoses     Intractable right heel pain    -  Primary   Plantar fasciitis, right           -Complete examination performed.  -Previous x-rays reviewed. -Discussed with patient in detail the condition of plantar fasciitis, and neuropathy secondary  to diabetes how this occurs related to the foot type of the patient and general treatment options. -Patient opt for surgical management. Consent obtained for Right EPF and bilateral PRP injection. Pre and Post op course explained. Risks, benefits, alternatives explained. No guarantees given or implied. Surgical booking slip submitted and provided patient with Surgical packet and info for [facility] -To use CAM boot and crutches post op  -Recommend 6 weeks no driving and out of work -Resent Lyrica to pharmacy for neuropathy -Patient to return to office after surgery or sooner if problems or questions arise.  Asencion Islam, DPM

## 2021-03-24 ENCOUNTER — Other Ambulatory Visit: Payer: Self-pay

## 2021-03-27 ENCOUNTER — Other Ambulatory Visit: Payer: Self-pay

## 2021-04-09 IMAGING — CR DG CHEST 2V
2 series · 2 of 2 positions shown · non-contrast
Comparison: 03/23/2019

CLINICAL DATA: Chest pain

EXAM:
CHEST - 2 VIEW

[w chest pa]
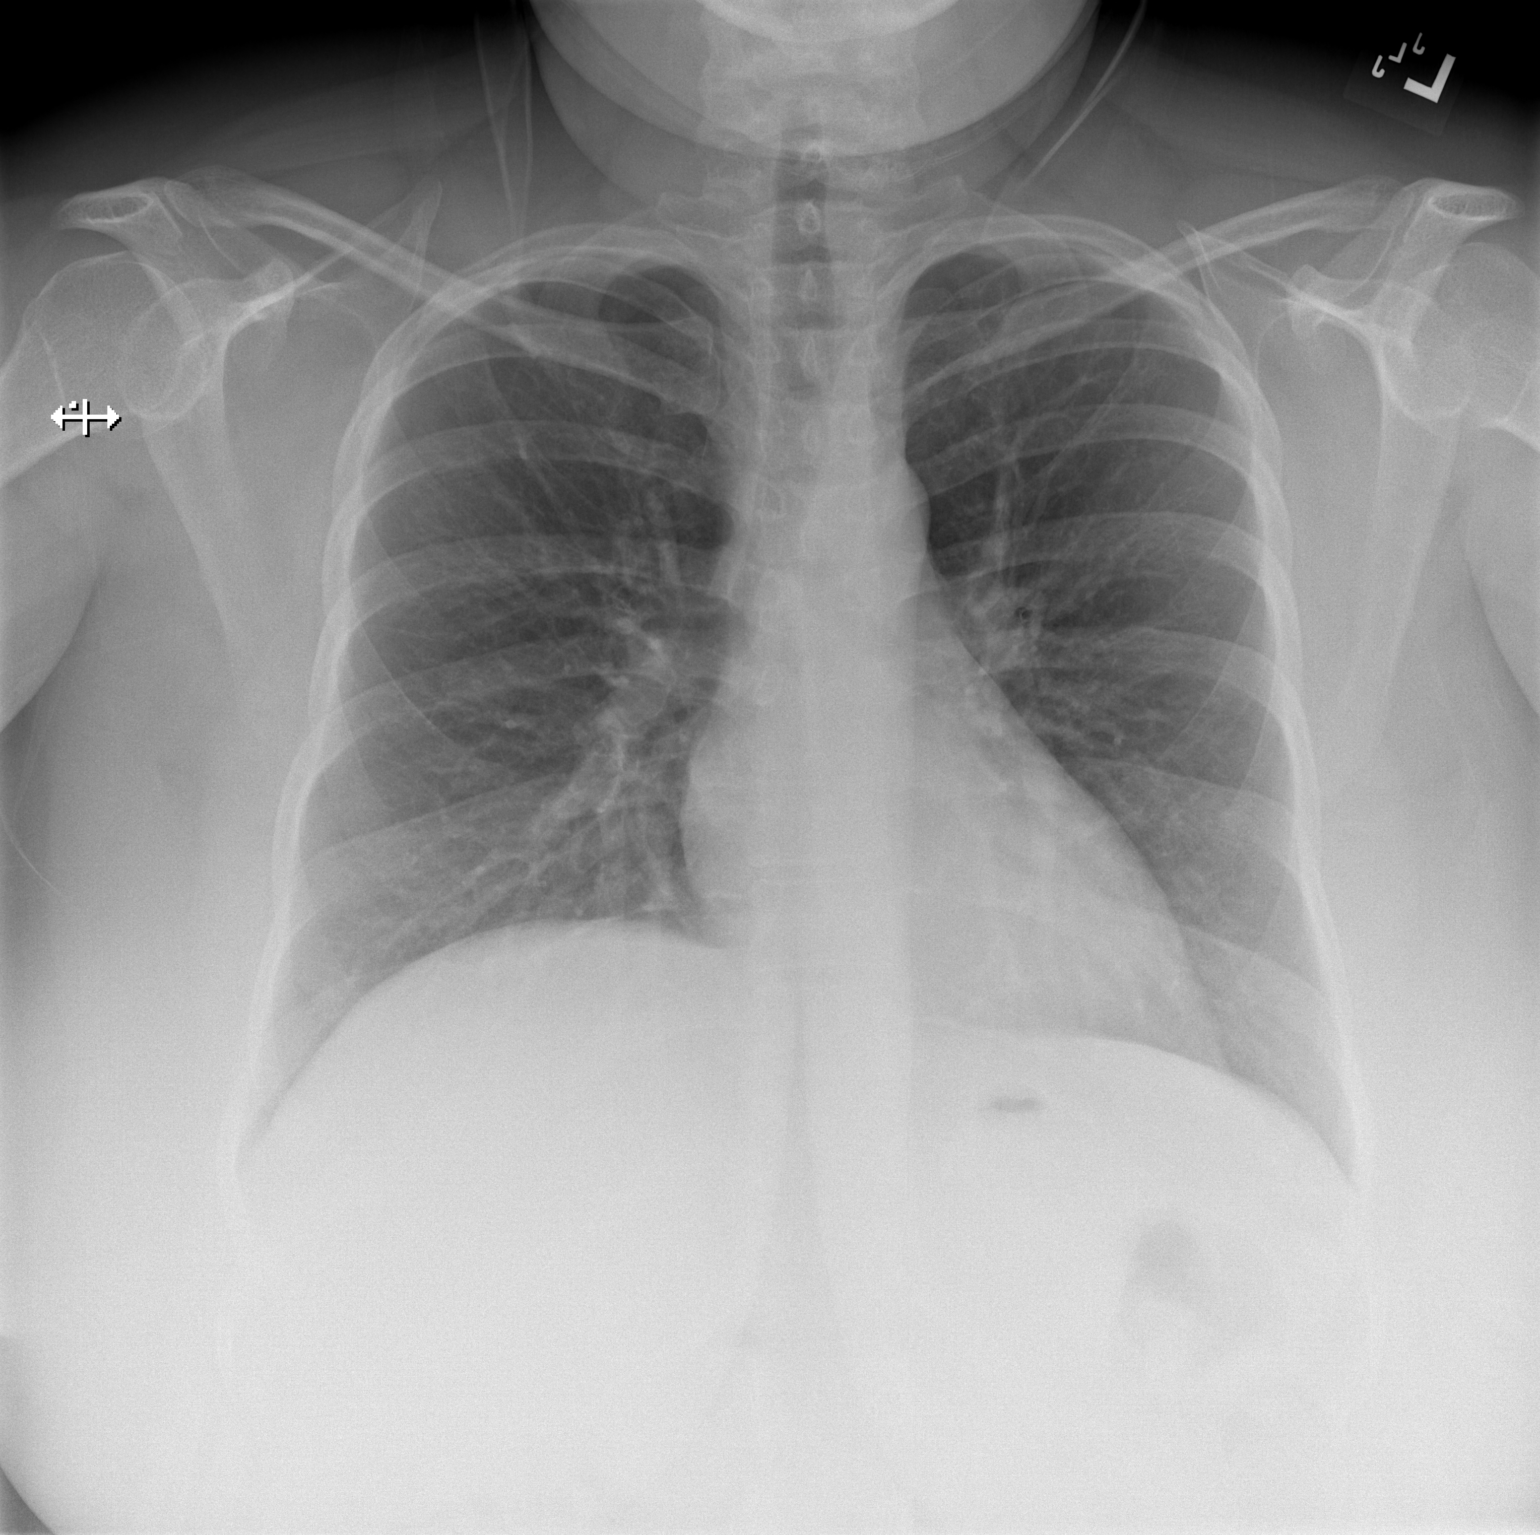

[w chest lat]
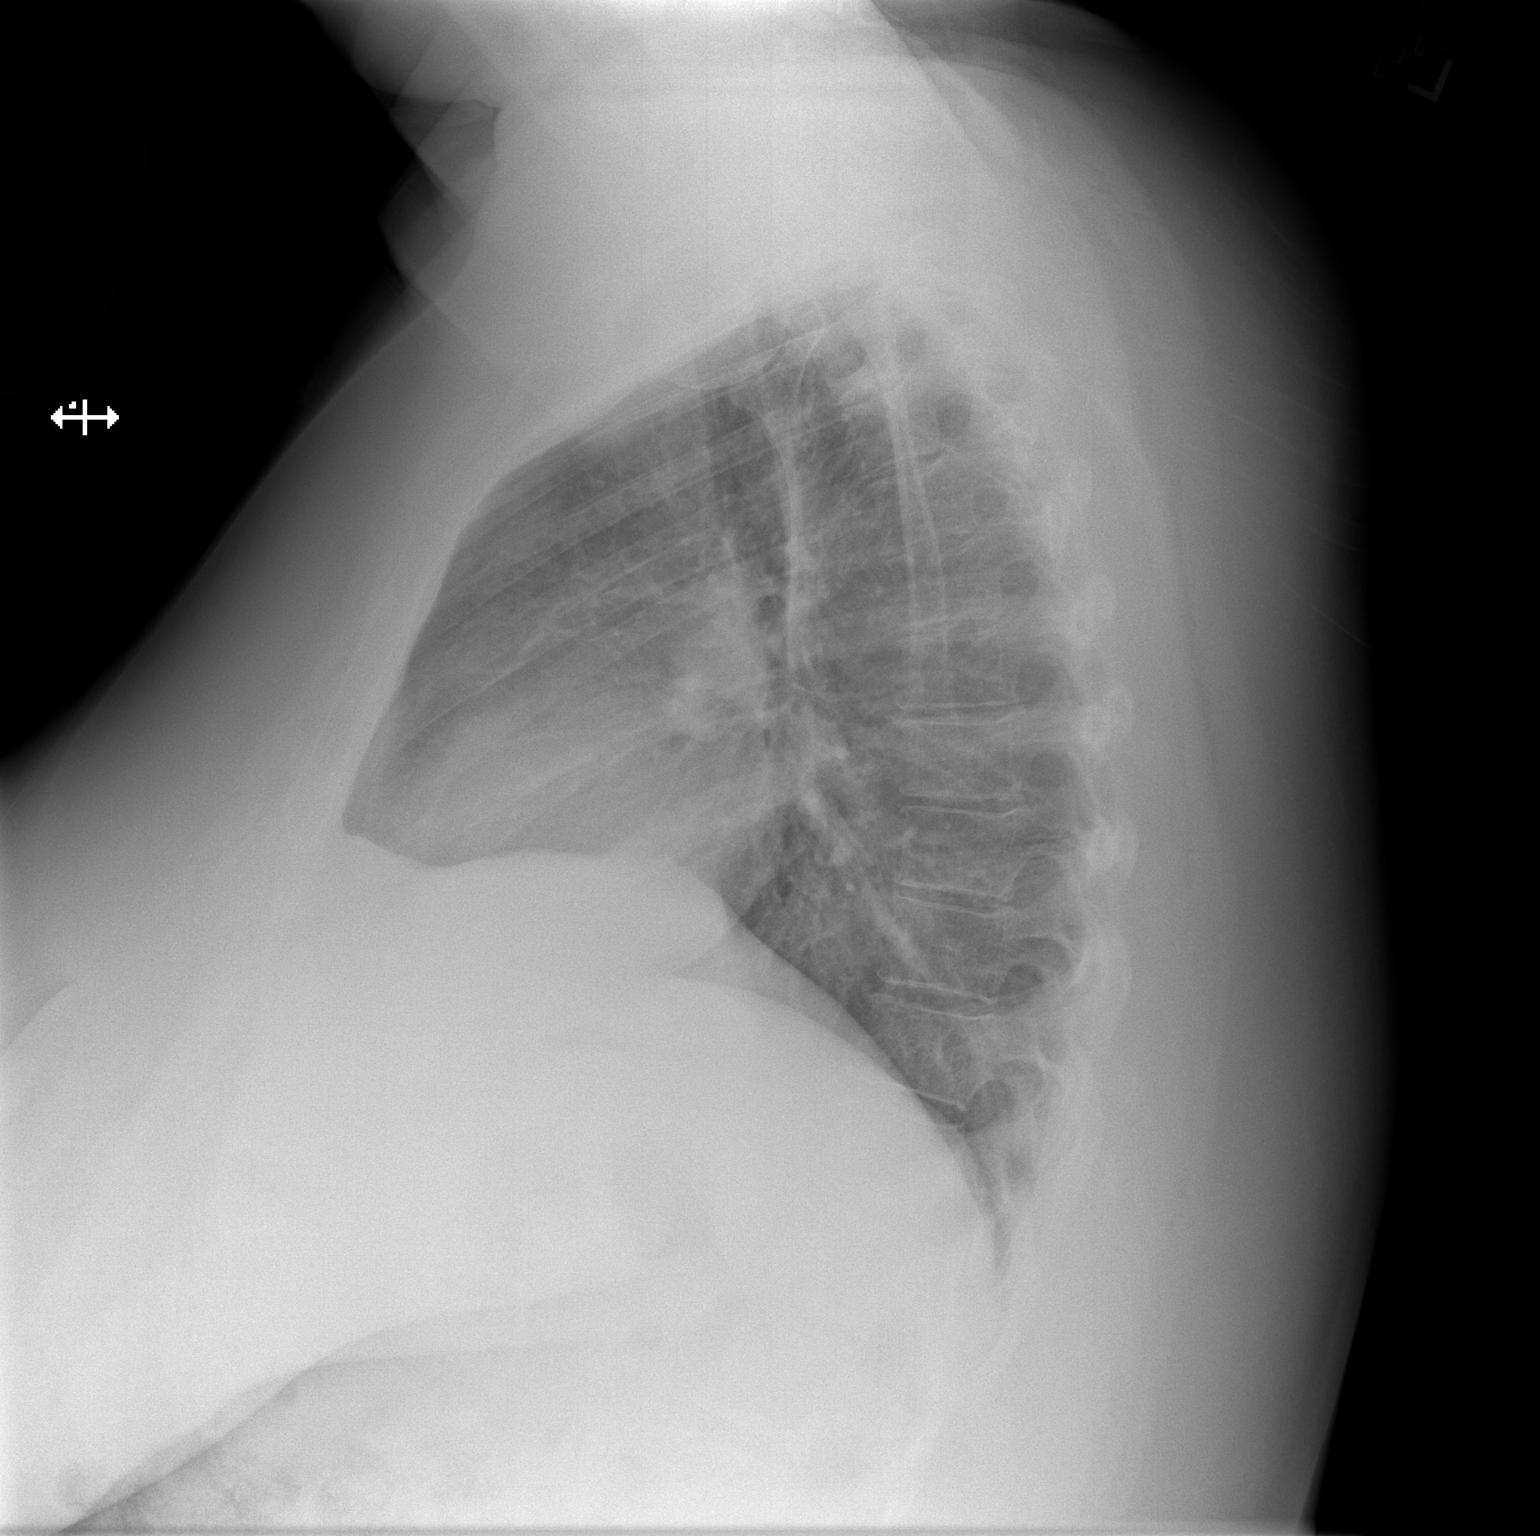

[2 of 2 positions shown; findings below may reference images not displayed]

FINDINGS: The heart size and mediastinal contours are within normal limits.
Both lungs are clear. The visualized skeletal structures are
unremarkable.
IMPRESSION: No active cardiopulmonary disease.

## 2021-04-10 ENCOUNTER — Other Ambulatory Visit: Payer: Self-pay

## 2021-04-12 ENCOUNTER — Other Ambulatory Visit: Payer: Self-pay

## 2021-04-13 ENCOUNTER — Other Ambulatory Visit: Payer: Self-pay

## 2021-04-18 ENCOUNTER — Ambulatory Visit (HOSPITAL_COMMUNITY)
Admission: EM | Admit: 2021-04-18 | Discharge: 2021-04-18 | Disposition: A | Discharge status: Home / Self Care | Payer: Medicaid Other | Attending: Family Medicine | Admitting: Family Medicine

## 2021-04-18 ENCOUNTER — Other Ambulatory Visit: Payer: Self-pay

## 2021-04-18 ENCOUNTER — Encounter (HOSPITAL_COMMUNITY): Payer: Self-pay

## 2021-04-18 DIAGNOSIS — K5900 Constipation, unspecified: Secondary | ICD-10-CM

## 2021-04-18 MED ORDER — LACTULOSE 20 GM/30ML PO SOLN: 30.0000 mL | Freq: Two times a day (BID) | ORAL | 0 refills | Status: DC | PRN

## 2021-04-18 NOTE — ED Triage Notes (Signed)
Pt presents with c/o constipation x 4 days. States she ate peanuts recently and states she has been trying hard to have a bowel movement. States she has tried everything.

## 2021-04-19 NOTE — ED Provider Notes (Signed)
[facility]   [id number] 04/18/21 Arrival Time: 1834  ASSESSMENT & PLAN:  1. Constipation, unspecified constipation type    Benign abdomen. No signs of SBO.  Trial of: Meds ordered this encounter  Medications   Lactulose 20 GM/30ML SOLN    Sig: Take 30 mLs (20 g total) by mouth 2 (two) times daily as needed (for constipation).    Dispense:  450 mL    Refill:  0   Reviewed expectations re: course of current medical issues. Questions answered. Outlined signs and symptoms indicating need for more acute intervention. Patient verbalized understanding. After Visit Summary given.   SUBJECTIVE: History from: patient.  Sarah Garrison is a 40 y.o. female who reports constipation; x 4 days; passing gas from rectum; no abd pain. Afebrile. No n/v/d. OTC remedies without relief.  No LMP recorded (lmp unknown). Patient has had an injection.  Past Surgical History:  Procedure Laterality Date   CESAREAN SECTION     HERNIA REPAIR     TUBAL LIGATION     VENTRAL HERNIA REPAIR N/A 11/27/2017   Procedure: VENTRAL HERNIA REPAIR ERAS PATHWAY;  Surgeon: Harriette Bouillon, MD;  Location: [facility];  Service: General;  Laterality: N/A;   WISDOM TOOTH EXTRACTION      OBJECTIVE:  Vitals:   04/18/21 1924  BP: 129/88  Pulse: (!) 115  Resp: 19  Temp: 98.9 F (37.2 C)  TempSrc: Oral  SpO2: 98%    General appearance: alert; no distress Abdomen: soft Back: no CVA tenderness Extremities: no edema; symmetrical with no gross deformities Skin: warm; dry Neurologic: normal gait Psychological: alert and cooperative; normal mood and affect   Allergies  Allergen Reactions   Morphine And Related Shortness Of Breath   Glyburide Diarrhea and Other (See Comments)    Reaction:  Nose bleeds    Ivp Dye [Iodinated Diagnostic Agents] Other (See Comments)    Shortness of breath.     Oxycodone Other (See Comments)    Pt states that this medication makes her "dream about  rabbits chasing" her.     Vicodin [Hydrocodone-Acetaminophen] Other (See Comments)    Pt states that this medication makes her "dream about rabbits chasing" her.                                                 Past Medical History:  Diagnosis Date   Asthma    COVID-19    Diabetes mellitus without complication (HCC)    GERD (gastroesophageal reflux disease)    Hypertension    Seizures (HCC)    Social History   Socioeconomic History   Marital status: Single    Spouse name: Not on file   Number of children: 4   Years of education: Not on file   Highest education level: Not on file  Occupational History   Not on file  Tobacco Use   Smoking status: Former    Packs/day: 0.10    Years: 2.00    Pack years: 0.20    Types: Cigarettes    Quit date: 11/27/2017    Years since quitting: 3.3   Smokeless tobacco: Never  Vaping Use   Vaping Use: Some days  Substance and Sexual Activity   Alcohol use: No   Drug use: No   Sexual activity: Yes    Birth control/protection: Surgical  Other  Topics Concern   Not on file  Social History Narrative   Right handed   No caffeine use   Diet coke sometimes    Social Determinants of Health   Financial Resource Strain: Not on file  Food Insecurity: Not on file  Transportation Needs: Not on file  Physical Activity: Not on file  Stress: Not on file  Social Connections: Not on file  Intimate Partner Violence: Not on file   Family History  Problem Relation Age of Onset   Asthma Mother    Kidney failure Mother    Brain cancer Mother    Asthma Father    Other Father        surgery on stomach but don't know from what   Diabetes Brother    Diabetes Maternal Dulce Sellar, MD 04/19/21 [phone]

## 2021-04-20 ENCOUNTER — Other Ambulatory Visit: Payer: Self-pay

## 2021-04-25 ENCOUNTER — Telehealth: Payer: Self-pay | Admitting: Urology

## 2021-04-25 NOTE — Telephone Encounter (Signed)
DOS - 05/08/21  EPF RIGHT --- [id number] PRP BILAT --- [id number]   HEALTHY BLUE EFFECTIVE DATE - 12/02/20   PER [employer] SITE FOR CPT CODES 81103 AND 20550 NO PRIOR AUTH IS REQUIRED.  TRACKING ID # [id number]

## 2021-04-26 ENCOUNTER — Encounter: Payer: Self-pay | Admitting: Family Medicine

## 2021-04-26 ENCOUNTER — Other Ambulatory Visit: Payer: Self-pay

## 2021-04-26 ENCOUNTER — Ambulatory Visit: Payer: Medicaid Other | Attending: Family Medicine | Admitting: Family Medicine

## 2021-04-26 VITALS — BP 133/86 | HR 90 | Ht 64.0 in | Wt 261.0 lb

## 2021-04-26 DIAGNOSIS — Z01818 Encounter for other preprocedural examination: Secondary | ICD-10-CM | POA: Diagnosis not present

## 2021-04-26 DIAGNOSIS — K5909 Other constipation: Secondary | ICD-10-CM | POA: Diagnosis not present

## 2021-04-26 DIAGNOSIS — M25551 Pain in right hip: Secondary | ICD-10-CM

## 2021-04-26 MED ORDER — LINACLOTIDE 72 MCG PO CAPS
72.0000 ug | ORAL_CAPSULE | Freq: Every day | ORAL | 3 refills | Status: DC
Start: 2021-04-26 — End: 2022-08-17
  Filled 2021-04-26: qty 30, 30d supply, fill #0

## 2021-04-26 NOTE — Progress Notes (Signed)
Subjective:  Patient ID: Sarah Garrison, female    DOB: 1981-03-06  Age: 40 y.o. MRN: [id number]  CC: Pre-op Exam   HPI Sarah Garrison is a 40 y.o. year old female with a history of type 2 diabetes mellitus (A1c 7.0-followed by endocrinology, [facility]), obesity, history of Covid in 09/2019  Interval History: She is being worked up for R foot endoscopic plantar fascitis scheduled for 05/07/2021 when she is needing a preoperative clearance.  She continues to have pain in her right heel. Also has right hip pain which she noticed 3 days ago with no preceding trauma and pain does not radiate.  She complains of constipation unrelieved by Miralax. Last BM was this morning but prior to that was 4 days ago and this has been a pattern for her.  Laxatives like MiraLAX and lactulose have been ineffective.  Current A1c is 7.0 from 02/2021 and she is under the care of request [facility] endocrinology.  Currently on an insulin pump which provides adequate glycemic control. Past Medical History:  Diagnosis Date   Asthma    COVID-19    Diabetes mellitus without complication (HCC)    GERD (gastroesophageal reflux disease)    Hypertension    Seizures (HCC)     Past Surgical History:  Procedure Laterality Date   CESAREAN SECTION     HERNIA REPAIR     TUBAL LIGATION     VENTRAL HERNIA REPAIR N/A 11/27/2017   Procedure: VENTRAL HERNIA REPAIR ERAS PATHWAY;  Surgeon: Harriette Bouillon, MD;  Location: [facility];  Service: General;  Laterality: N/A;   WISDOM TOOTH EXTRACTION      Family History  Problem Relation Age of Onset   Asthma Mother    Kidney failure Mother    Brain cancer Mother    Asthma Father    Other Father        surgery on stomach but don't know from what   Diabetes Brother    Diabetes Maternal Grandmother     Allergies  Allergen Reactions   Morphine And Related Shortness Of Breath   Glyburide Diarrhea and Other (See Comments)    Reaction:  Nose bleeds     Ivp Dye [Iodinated Diagnostic Agents] Other (See Comments)    Shortness of breath.     Oxycodone Other (See Comments)    Pt states that this medication makes her "dream about rabbits chasing" her.     Vicodin [Hydrocodone-Acetaminophen] Other (See Comments)    Pt states that this medication makes her "dream about rabbits chasing" her.      Outpatient Medications Prior to Visit  Medication Sig Dispense Refill   albuterol (VENTOLIN HFA) 108 (90 Base) MCG/ACT inhaler Inhale 1-2 puffs into the lungs every 6 (six) hours as needed for wheezing or shortness of breath. 18 g 1   atorvastatin (LIPITOR) 20 MG tablet Take 1 tablet (20 mg total) by mouth daily. 90 tablet 1   atorvastatin (LIPITOR) 20 MG tablet Take 1 tablet by mouth daily.     atorvastatin (LIPITOR) 40 MG tablet Take 1 tablet (40 mg total) by mouth daily. 90 tablet 1   cephALEXin (KEFLEX) 500 MG capsule Take 1 capsule (500 mg total) by mouth 2 (two) times daily. 14 capsule 0   cetirizine (ZYRTEC) 10 MG tablet TAKE 1 TABLET (10 MG TOTAL) BY MOUTH DAILY. 30 tablet 2   Continuous Blood Gluc Receiver (DEXCOM G6 RECEIVER) DEVI 1 each by Misc.(Non-Drug; Combo Route) route daily.  Continuous Blood Gluc Sensor (DEXCOM G6 SENSOR) MISC SMARTSIG:1 Topical Every 10 Days     Continuous Blood Gluc Sensor (DEXCOM G6 SENSOR) MISC Use to continuously monitor glucose.  Change every 10 days 3 each 0   Continuous Blood Gluc Transmit (DEXCOM G6 TRANSMITTER) MISC USE TO CHECK BLOOD SUGAR EVERY 3 MONTHS     fluticasone (FLONASE) 50 MCG/ACT nasal spray PLACE 1 SPRAY INTO BOTH NOSTRILS IN THE MORNING AND AT BEDTIME. 16 g 2   fluticasone-salmeterol (ADVAIR HFA) 115-21 MCG/ACT inhaler Inhale 2 puffs into the lungs 2 (two) times daily. 1 Inhaler 12   Fluticasone-Salmeterol (ADVAIR) 100-50 MCG/DOSE AEPB Inhale 1 puff into the lungs 2 (two) times daily.     insulin aspart (NOVOLOG) 100 UNIT/ML injection use via insulin pump. max total daily dose of 300 units 530 mL  0   insulin aspart (NOVOLOG) 100 UNIT/ML injection USE VIA INSULIN PUMP. MAX TOTAL DAILY DOSE OF 300 UNITS 90 mL 5   insulin aspart (NOVOLOG) 100 UNIT/ML injection USE VIA INSULIN PUMP. MAX TOTAL DAILY DOSE OF 300 UNITS 90 mL 5   insulin degludec (TRESIBA FLEXTOUCH) 100 UNIT/ML FlexTouch Pen Inject 60 units once a day as needed in setting of pump failure 30 mL 2   insulin degludec (TRESIBA) 100 UNIT/ML FlexTouch Pen Inject into the skin.     insulin glargine, 1 Unit Dial, (TOUJEO SOLOSTAR) 300 UNIT/ML Solostar Pen Administer 50 units once daily only if insulin pump malfunction 9 mL 1   Insulin Human (INSULIN PUMP) SOLN Inject 1 each into the skin See admin instructions. Medication: Novolog 100 units/ml injection. Takes 100 units via pump per patient.     Insulin Syringe-Needle U-100 31G X 5/16" 1 ML MISC inject 10 units into the skin 3 times daily. use to inject novolog based on insulin to card ratio- max daily dose 150 units 100 each 2   Lactulose 20 GM/30ML SOLN Take 30 mLs (20 g total) by mouth 2 (two) times daily as needed (for constipation). 450 mL 0   lidocaine (XYLOCAINE) 2 % solution USE AS DIRECTED 10 MLS IN THE MOUTH OR THROAT AS NEEDED FOR MOUTH PAIN. 100 mL 0   lisinopril (ZESTRIL) 20 MG tablet TAKE 1 TABLET (20 MG TOTAL) BY MOUTH DAILY. 90 tablet 2   lisinopril (ZESTRIL) 20 MG tablet Take 1 tablet (20 mg total) by mouth daily. 90 tablet 2   lisinopril (ZESTRIL) 20 MG tablet Take 1 tablet by mouth daily.     losartan (COZAAR) 50 MG tablet Take 1 tablet (50 mg total) by mouth daily. 90 tablet 3   meloxicam (MOBIC) 7.5 MG tablet Take 1 tablet (7.5 mg total) by mouth 2 (two) times daily as needed for up to 14 doses for pain. 30 tablet 2   methocarbamol (ROBAXIN) 500 MG tablet Take 1 tablet (500 mg total) by mouth 2 (two) times daily as needed. 20 tablet 0   Misc. Devices MISC CPAP therapy on autopap 5-15.  Needs Small size [employer] Full Face Mask Simplus  mask and heated  humidification. 1 each 0   nystatin (MYCOSTATIN) 100000 UNIT/ML suspension Take 5 mLs by mouth 4 (four) times daily.     ondansetron (ZOFRAN ODT) 8 MG disintegrating tablet Take 1 tablet (8 mg total) by mouth every 8 (eight) hours as needed for nausea or vomiting. 20 tablet 0   pantoprazole (PROTONIX) 40 MG tablet TAKE 1 TABLET (40 MG TOTAL) BY MOUTH DAILY. 30 tablet 3   polyethylene glycol powder (  GLYCOLAX/MIRALAX) 17 GM/SCOOP powder Take 17 g by mouth daily. 3350 g 1   pregabalin (LYRICA) 75 MG capsule Take 1 capsule (75 mg total) by mouth 2 (two) times daily. 60 capsule 2   sertraline (ZOLOFT) 100 MG tablet TAKE 1 TABLET (100 MG TOTAL) BY MOUTH DAILY. 30 tablet 3   sitaGLIPtin (JANUVIA) 100 MG tablet Take by mouth.     Syringe/Needle, Disp, (SYRINGE 3CC/21GX1") 21G X 1" 3 ML MISC Use to fill pump cartridge with insulin as directed.     TRUEPLUS PEN NEEDLES 32G X 4 MM MISC USE 3 TIMES DAILY AS DIRECTED 100 each 5   VITAMIN D PO Take 1 tablet by mouth 2 (two) times daily.     No facility-administered medications prior to visit.     ROS Review of Systems  Constitutional:  Negative for activity change, appetite change and fatigue.  HENT:  Negative for congestion, sinus pressure and sore throat.   Eyes:  Negative for visual disturbance.  Respiratory:  Negative for cough, chest tightness, shortness of breath and wheezing.   Cardiovascular:  Negative for chest pain and palpitations.  Gastrointestinal:  Negative for abdominal distention, abdominal pain and constipation.  Endocrine: Negative for polydipsia.  Genitourinary:  Negative for dysuria and frequency.  Musculoskeletal:  Negative for arthralgias and back pain.  Skin:  Negative for rash.  Neurological:  Negative for tremors, light-headedness and numbness.  Hematological:  Does not bruise/bleed easily.  Psychiatric/Behavioral:  Negative for agitation and behavioral problems.    Objective:  BP 133/86   Pulse 90   Ht 5\' 4"  (1.626 m)    Wt 261 lb (118.4 kg)   LMP  (LMP Unknown)   SpO2 98%   BMI 44.80 kg/m   BP/Weight 04/26/2021 04/18/2021 02/21/2021  Systolic BP 133 129 138  Diastolic BP 86 88 98  Wt. (Lbs) 261 - -  BMI 44.8 - -      Physical Exam Constitutional:      Appearance: She is well-developed.  Cardiovascular:     Rate and Rhythm: Normal rate.     Heart sounds: Normal heart sounds. No murmur heard. Pulmonary:     Effort: Pulmonary effort is normal.     Breath sounds: Normal breath sounds. No wheezing or rales.  Chest:     Chest wall: No tenderness.  Abdominal:     General: Bowel sounds are normal. There is no distension.     Palpations: Abdomen is soft. There is no mass.     Tenderness: There is no abdominal tenderness.  Musculoskeletal:     Right lower leg: No edema.     Left lower leg: No edema.     Comments: Tenderness to palpation of the lateral aspect of right hip  Neurological:     Mental Status: She is alert and oriented to person, place, and time.  Psychiatric:        Mood and Affect: Mood normal.   Diabetic Foot Exam - Simple   Simple Foot Form Diabetic Foot exam was performed with the following findings: Yes 04/26/2021 10:07 AM  Visual Inspection No deformities, no ulcerations, no other skin breakdown bilaterally: Yes Sensation Testing Intact to touch and monofilament testing bilaterally: Yes Pulse Check Posterior Tibialis and Dorsalis pulse intact bilaterally: Yes Comments     CMP Latest Ref Rng & Units 10/26/2020 01/26/2020 10/16/2019  Glucose 65 - 99 mg/dL 10/18/2019) 440(N) 027(O)  BUN 6 - 20 mg/dL 12 16 15   Creatinine 0.57 - 1.00 mg/dL  0.86 0.58 0.80  Sodium 134 - 144 mmol/L 137 134(L) 132(L)  Potassium 3.5 - 5.2 mmol/L 4.8 3.9 4.8  Chloride 96 - 106 mmol/L 103 105 99  CO2 20 - 29 mmol/L 20 18(L) 22  Calcium 8.7 - 10.2 mg/dL 9.6 9.0 0.1(V)  Total Protein 6.0 - 8.5 g/dL 6.6 7.1 7.0  Total Bilirubin 0.0 - 1.2 mg/dL 0.5 0.3 4.9(S)  Alkaline Phos 44 - 121 IU/L 109 89 100   AST 0 - 40 IU/L 10 15 20   ALT 0 - 32 IU/L 20 18 47(H)    Lipid Panel     Component Value Date/Time   CHOL 244 (H) 10/26/2020 0908   TRIG 85 10/26/2020 0908   HDL 50 10/26/2020 0908   CHOLHDL 4.9 (H) 10/26/2020 0908   CHOLHDL 6 03/03/2018 1419   VLDL 37.4 03/03/2018 1419   LDLCALC 179 (H) 10/26/2020 0908    CBC    Component Value Date/Time   WBC 7.3 10/26/2020 0908   WBC 6.6 01/26/2020 0235   RBC 4.97 10/26/2020 0908   RBC 5.10 01/26/2020 0235   HGB 14.5 10/26/2020 0908   HCT 44.5 10/26/2020 0908   PLT 339 10/26/2020 0908   MCV 90 10/26/2020 0908   MCH 29.2 10/26/2020 0908   MCH 29.8 01/26/2020 0235   MCHC 32.6 10/26/2020 0908   MCHC 34.4 01/26/2020 0235   RDW 12.4 10/26/2020 0908   LYMPHSABS 3.1 10/26/2020 0908   MONOABS 0.4 01/26/2020 0235   EOSABS 0.0 10/26/2020 0908   BASOSABS 0.0 10/26/2020 0908    Lab Results  Component Value Date   HGBA1C 12.2 (H) 10/07/2019   A1c was 7.0  on 02/24/21 (Care everywhere) Assessment & Plan:  1. Other constipation Uncontrolled Increased fiber intake and water intake Will place on Linzess - linaclotide (LINZESS) 72 MCG capsule; Take 1 capsule (72 mcg total) by mouth daily before breakfast.  Dispense: 30 capsule; Refill: 3  2. Preoperative clearance Medically optimized for moderate risk procedure Preop form completed  3. Right hip pain Advised to use OTC NSAIDs If symptoms persist consider imaging    Meds ordered this encounter  Medications   linaclotide (LINZESS) 72 MCG capsule    Sig: Take 1 capsule (72 mcg total) by mouth daily before breakfast.    Dispense:  30 capsule    Refill:  3     Follow-up: Return in about 3 months (around 07/27/2021) for Chronic medical conditions.       07/29/2021, MD, FAAFP. [facility] [place], [place]   04/26/2021, 11:59 AM

## 2021-04-26 NOTE — Progress Notes (Signed)
Surgery Clearance

## 2021-04-26 NOTE — Patient Instructions (Signed)

## 2021-05-03 ENCOUNTER — Encounter (HOSPITAL_BASED_OUTPATIENT_CLINIC_OR_DEPARTMENT_OTHER): Payer: Self-pay | Admitting: Sports Medicine

## 2021-05-04 ENCOUNTER — Encounter (HOSPITAL_BASED_OUTPATIENT_CLINIC_OR_DEPARTMENT_OTHER): Payer: Self-pay | Admitting: Sports Medicine

## 2021-05-04 ENCOUNTER — Other Ambulatory Visit: Payer: Self-pay

## 2021-05-04 NOTE — Progress Notes (Addendum)
Spoke w/ via phone for pre-op interview--- pt Lab needs dos---- [employer] and ekg              Lab results------ no COVID test -----patient states asymptomatic no test needed Arrive at ------- 1200 on 05-08-2021 NPO after MN NO Solid Food.  Clear liquids from MN until--- 1100 Med rec completed Medications to take morning of surgery ----- protonix Diabetic medication ----- pt to follow her endocrinologist instructions for her insulin pump Patient instructed no nail polish to be worn day of surgery Patient instructed to bring photo id and insurance card day of surgery Patient aware to have Driver (ride ) --son, cyrese harding i/ caregiver for 24 hours after surgery -- pt stated she has a 39 yr old at home with will be caregiver, to bring name/ number at check-in  Patient Special Instructions ----- pt verbalized understanding to follow her endocrinologist instructions concerning her insulin pump for surgery, she has an appointment 05-05-2021. Pt has concerned about surgery at 1400 and not morning time due to DM, even though can have clear liquids until 1100.  Advised pt to speak with endocrinologist let him know of the liquids she can have until 1100 no solid food and if they or she would want to have morning surgery she is to call Dr Marylene Land office to reschedule.  Pre-Op special Istructions ----- received pt's pcp, Dr Alvis Lemmings, dated 04-26-2021 via fax from Dr Marylene Land office, placed in chart. Pt has dexcom continuous glucose monitor on lower left abd. Patient verbalized understanding of instructions that were given at this phone interview. Patient denies shortness of breath, chest pain, fever, cough at this phone interview.    Anesthesia Review: HTN;  IDDM2 w/ insulin pump; Asthma (no inhaler's), pt stated last flare 05/ 2021 with covid;  hx seizure , during hospital admission for covid pneurmonia, no meds, pt stated none since;  moderate OSA pt stated has not used since 05/ 2021; Chronic SOB  PCP: Dr  Alvis Lemmings [facility] 04-26-2021 epic) Endocrinology:  Fredia Sorrow FNP [facility] 02-24-2021 and per pt next appt 05-05-2021, epic) Chest x-ray : 01-25-2020 epic EKG : 11-23-2019 epic Echo : no Stress test: no Cardiac Cath : no Activity level:  chronic sob w/ activity Sleep Study/ CPAP : yes/ no Fasting Blood Sugar :  101--250    / Checks Blood Sugar -- times a day:  several times daily w/ dexcom Blood Thinner/ Instructions /Last Dose: no ASA / Instructions/ Last Dose :  no

## 2021-05-05 ENCOUNTER — Other Ambulatory Visit: Payer: Self-pay

## 2021-05-05 MED ORDER — LISINOPRIL 20 MG PO TABS
20.0000 mg | ORAL_TABLET | Freq: Every day | ORAL | 2 refills | Status: DC
  Filled 2021-05-05: qty 90, 90d supply, fill #0

## 2021-05-05 MED ORDER — ATORVASTATIN CALCIUM 20 MG PO TABS
20.0000 mg | ORAL_TABLET | Freq: Every day | ORAL | 1 refills | Status: DC
  Filled 2021-05-05 – 2021-06-02 (×2): qty 90, 90d supply, fill #0

## 2021-05-08 ENCOUNTER — Encounter (HOSPITAL_BASED_OUTPATIENT_CLINIC_OR_DEPARTMENT_OTHER): Payer: Self-pay | Admitting: Sports Medicine

## 2021-05-08 ENCOUNTER — Ambulatory Visit (HOSPITAL_BASED_OUTPATIENT_CLINIC_OR_DEPARTMENT_OTHER)
Admission: RE | Admit: 2021-05-08 | Discharge: 2021-05-08 | Disposition: A | Discharge status: Home / Self Care | Payer: Medicaid Other | Attending: Sports Medicine | Admitting: Sports Medicine

## 2021-05-08 ENCOUNTER — Other Ambulatory Visit: Payer: Self-pay | Admitting: Sports Medicine

## 2021-05-08 ENCOUNTER — Other Ambulatory Visit: Payer: Self-pay

## 2021-05-08 ENCOUNTER — Encounter: Payer: Self-pay | Admitting: Sports Medicine

## 2021-05-08 ENCOUNTER — Ambulatory Visit (HOSPITAL_BASED_OUTPATIENT_CLINIC_OR_DEPARTMENT_OTHER): Payer: Medicaid Other | Admitting: Anesthesiology

## 2021-05-08 ENCOUNTER — Encounter (HOSPITAL_BASED_OUTPATIENT_CLINIC_OR_DEPARTMENT_OTHER)
Admission: RE | Disposition: A | Discharge status: Home / Self Care | Payer: Self-pay | Source: Home / Self Care | Attending: Sports Medicine

## 2021-05-08 DIAGNOSIS — K219 Gastro-esophageal reflux disease without esophagitis: Secondary | ICD-10-CM | POA: Insufficient documentation

## 2021-05-08 DIAGNOSIS — G4733 Obstructive sleep apnea (adult) (pediatric): Secondary | ICD-10-CM | POA: Insufficient documentation

## 2021-05-08 DIAGNOSIS — Z87891 Personal history of nicotine dependence: Secondary | ICD-10-CM | POA: Diagnosis not present

## 2021-05-08 DIAGNOSIS — J45909 Unspecified asthma, uncomplicated: Secondary | ICD-10-CM | POA: Insufficient documentation

## 2021-05-08 DIAGNOSIS — E119 Type 2 diabetes mellitus without complications: Secondary | ICD-10-CM | POA: Insufficient documentation

## 2021-05-08 DIAGNOSIS — Z9889 Other specified postprocedural states: Secondary | ICD-10-CM

## 2021-05-08 DIAGNOSIS — Z794 Long term (current) use of insulin: Secondary | ICD-10-CM | POA: Diagnosis not present

## 2021-05-08 DIAGNOSIS — Z6841 Body Mass Index (BMI) 40.0 and over, adult: Secondary | ICD-10-CM | POA: Diagnosis not present

## 2021-05-08 DIAGNOSIS — M722 Plantar fascial fibromatosis: Secondary | ICD-10-CM | POA: Diagnosis not present

## 2021-05-08 DIAGNOSIS — I10 Essential (primary) hypertension: Secondary | ICD-10-CM | POA: Insufficient documentation

## 2021-05-08 DIAGNOSIS — Z79899 Other long term (current) drug therapy: Secondary | ICD-10-CM | POA: Diagnosis not present

## 2021-05-08 HISTORY — PX: STERIOD INJECTION: SHX5046

## 2021-05-08 HISTORY — DX: Other constipation: K59.09

## 2021-05-08 HISTORY — PX: PLANTAR FASCIA RELEASE: SHX2239

## 2021-05-08 HISTORY — DX: Anxiety disorder, unspecified: F41.9

## 2021-05-08 HISTORY — DX: Depression, unspecified: F32.A

## 2021-05-08 HISTORY — DX: Type 2 diabetes mellitus without complications: Z79.4

## 2021-05-08 HISTORY — DX: Mixed hyperlipidemia: E78.2

## 2021-05-08 HISTORY — DX: Personal history of other specified conditions: Z87.898

## 2021-05-08 HISTORY — DX: Type 2 diabetes mellitus without complications: E11.9

## 2021-05-08 HISTORY — DX: Shortness of breath: R06.02

## 2021-05-08 HISTORY — DX: Obstructive sleep apnea (adult) (pediatric): G47.33

## 2021-05-08 HISTORY — DX: Plantar fascial fibromatosis: M72.2

## 2021-05-08 HISTORY — DX: Presence of insulin pump (external) (internal): Z96.41

## 2021-05-08 LAB — POCT I-STAT, CHEM 8
BUN: 13 mg/dL (ref 6–20)
Calcium, Ion: 1.26 mmol/L (ref 1.15–1.40)
Chloride: 106 mmol/L (ref 98–111)
Creatinine, Ser: 0.6 mg/dL (ref 0.44–1.00)
Glucose, Bld: 118 mg/dL — ABNORMAL HIGH (ref 70–99)
HCT: 42 % (ref 36.0–46.0)
Hemoglobin: 14.3 g/dL (ref 12.0–15.0)
Potassium: 3.7 mmol/L (ref 3.5–5.1)
Sodium: 141 mmol/L (ref 135–145)
TCO2: 21 mmol/L — ABNORMAL LOW (ref 22–32)

## 2021-05-08 LAB — GLUCOSE, CAPILLARY: Glucose-Capillary: 90 mg/dL (ref 70–99)

## 2021-05-08 LAB — POCT PREGNANCY, URINE: Preg Test, Ur: NEGATIVE

## 2021-05-08 SURGERY — FASCIOTOMY, PLANTAR, ENDOSCOPIC
Anesthesia: Regional | Laterality: Right

## 2021-05-08 MED ORDER — MIDAZOLAM HCL 2 MG/2ML IJ SOLN
2.0000 mg | Freq: Once | INTRAMUSCULAR | Status: AC
  Administered 2021-05-08: 2 mg via INTRAVENOUS

## 2021-05-08 MED ORDER — LACTATED RINGERS IV SOLN: INTRAVENOUS | Status: DC

## 2021-05-08 MED ORDER — TRAMADOL HCL 50 MG PO TABS: 50.0000 mg | ORAL_TABLET | Freq: Four times a day (QID) | ORAL | 0 refills | Status: DC | PRN

## 2021-05-08 MED ORDER — CHLORHEXIDINE GLUCONATE CLOTH 2 % EX PADS: 6.0000 | MEDICATED_PAD | Freq: Once | CUTANEOUS | Status: DC

## 2021-05-08 MED ORDER — NAPROXEN 500 MG PO TBEC: 500.0000 mg | DELAYED_RELEASE_TABLET | Freq: Two times a day (BID) | ORAL | 2 refills | Status: DC

## 2021-05-08 MED ORDER — LIDOCAINE 2% (20 MG/ML) 5 ML SYRINGE
INTRAMUSCULAR | Status: AC
  Filled 2021-05-08: qty 5

## 2021-05-08 MED ORDER — PROPOFOL 10 MG/ML IV BOLUS
INTRAVENOUS | Status: AC
  Filled 2021-05-08: qty 20

## 2021-05-08 MED ORDER — FENTANYL CITRATE (PF) 100 MCG/2ML IJ SOLN
INTRAMUSCULAR | Status: AC
  Filled 2021-05-08: qty 2

## 2021-05-08 MED ORDER — ONDANSETRON HCL 4 MG/2ML IJ SOLN
INTRAMUSCULAR | Status: DC | PRN
  Administered 2021-05-08: 4 mg via INTRAVENOUS

## 2021-05-08 MED ORDER — PROPOFOL 10 MG/ML IV BOLUS
INTRAVENOUS | Status: DC | PRN
  Administered 2021-05-08: 200 mg via INTRAVENOUS

## 2021-05-08 MED ORDER — CEFAZOLIN SODIUM-DEXTROSE 2-4 GM/100ML-% IV SOLN
2.0000 g | INTRAVENOUS | Status: AC
  Administered 2021-05-08: 2 g via INTRAVENOUS

## 2021-05-08 MED ORDER — ROPIVACAINE HCL 7.5 MG/ML IJ SOLN
INTRAMUSCULAR | Status: DC | PRN
  Administered 2021-05-08: 20 mL via PERINEURAL

## 2021-05-08 MED ORDER — MIDAZOLAM HCL 2 MG/2ML IJ SOLN
INTRAMUSCULAR | Status: AC
  Filled 2021-05-08: qty 2

## 2021-05-08 MED ORDER — LIDOCAINE HCL (CARDIAC) PF 100 MG/5ML IV SOSY
PREFILLED_SYRINGE | INTRAVENOUS | Status: DC | PRN
  Administered 2021-05-08: 100 mg via INTRAVENOUS

## 2021-05-08 MED ORDER — FENTANYL CITRATE (PF) 100 MCG/2ML IJ SOLN
INTRAMUSCULAR | Status: DC | PRN
  Administered 2021-05-08 (×2): 50 ug via INTRAVENOUS

## 2021-05-08 MED ORDER — MIDAZOLAM HCL 5 MG/5ML IJ SOLN
INTRAMUSCULAR | Status: DC | PRN
  Administered 2021-05-08: 2 mg via INTRAVENOUS

## 2021-05-08 MED ORDER — ONDANSETRON HCL 4 MG/2ML IJ SOLN
INTRAMUSCULAR | Status: AC
  Filled 2021-05-08: qty 2

## 2021-05-08 MED ORDER — PROMETHAZINE HCL 12.5 MG PO TABS: 12.5000 mg | ORAL_TABLET | Freq: Four times a day (QID) | ORAL | 0 refills | Status: DC | PRN

## 2021-05-08 MED ORDER — FENTANYL CITRATE (PF) 100 MCG/2ML IJ SOLN
50.0000 ug | Freq: Once | INTRAMUSCULAR | Status: AC
  Administered 2021-05-08: 50 ug via INTRAVENOUS

## 2021-05-08 MED ORDER — CEFAZOLIN SODIUM-DEXTROSE 2-4 GM/100ML-% IV SOLN
INTRAVENOUS | Status: AC
  Filled 2021-05-08: qty 100

## 2021-05-08 SURGICAL SUPPLY — 51 items
BLADE HOOK ENDO STRL (BLADE) ×1 IMPLANT
BLADE SURG 15 STRL LF DISP TIS (BLADE) ×4 IMPLANT
BLADE SURG 15 STRL SS (BLADE) ×6
BLADE TRIANGLE EPF/EGR ENDO (BLADE) ×1 IMPLANT
BNDG ADH 1X3 SHEER STRL LF (GAUZE/BANDAGES/DRESSINGS) ×1 IMPLANT
BNDG ADH THN 3X1 STRL LF (GAUZE/BANDAGES/DRESSINGS) ×2
BNDG CMPR 9X4 STRL LF SNTH (GAUZE/BANDAGES/DRESSINGS) ×2
BNDG CONFORM 2 STRL LF (GAUZE/BANDAGES/DRESSINGS) ×2 IMPLANT
BNDG ELASTIC 3X5.8 VLCR STR LF (GAUZE/BANDAGES/DRESSINGS) ×3 IMPLANT
BNDG ELASTIC 4X5.8 VLCR STR LF (GAUZE/BANDAGES/DRESSINGS) ×1 IMPLANT
BNDG ESMARK 4X9 LF (GAUZE/BANDAGES/DRESSINGS) ×3 IMPLANT
BNDG GAUZE ELAST 4 BULKY (GAUZE/BANDAGES/DRESSINGS) ×3 IMPLANT
COVER BACK TABLE 60X90IN (DRAPES) ×3 IMPLANT
CUFF TOURN SGL QUICK 18X4 (TOURNIQUET CUFF) ×1 IMPLANT
DRAPE EXTREMITY T 121X128X90 (DISPOSABLE) ×3 IMPLANT
DRAPE IMP U-DRAPE 54X76 (DRAPES) ×3 IMPLANT
DRAPE U-SHAPE 47X51 STRL (DRAPES) ×4 IMPLANT
DRSG EMULSION OIL 3X3 NADH (GAUZE/BANDAGES/DRESSINGS) ×2 IMPLANT
DURAPREP 26ML APPLICATOR (WOUND CARE) ×3 IMPLANT
ELECT REM PT RETURN 9FT ADLT (ELECTROSURGICAL) ×3
ELECTRODE REM PT RTRN 9FT ADLT (ELECTROSURGICAL) ×2 IMPLANT
GAUZE 4X4 16PLY ~~LOC~~+RFID DBL (SPONGE) IMPLANT
GAUZE SPONGE 4X4 12PLY STRL (GAUZE/BANDAGES/DRESSINGS) ×3 IMPLANT
GAUZE SPONGE 4X4 12PLY STRL LF (GAUZE/BANDAGES/DRESSINGS) ×1 IMPLANT
GAUZE XEROFORM 1X8 LF (GAUZE/BANDAGES/DRESSINGS) ×1 IMPLANT
GLOVE SURG ENC MOIS LTX SZ6.5 (GLOVE) ×3 IMPLANT
GLOVE SURG UNDER POLY LF SZ6.5 (GLOVE) ×6 IMPLANT
GOWN STRL REUS W/ TWL LRG LVL3 (GOWN DISPOSABLE) ×4 IMPLANT
GOWN STRL REUS W/TWL LRG LVL3 (GOWN DISPOSABLE) ×6
KIT PLATELET ACP SERIES I (KITS) ×1 IMPLANT
KIT TURNOVER CYSTO (KITS) ×3 IMPLANT
NDL HYPO 25X1 1.5 SAFETY (NEEDLE) ×4 IMPLANT
NDL SAFETY ECLIPSE 18X1.5 (NEEDLE) IMPLANT
NEEDLE HYPO 18GX1.5 SHARP (NEEDLE)
NEEDLE HYPO 25X1 1.5 SAFETY (NEEDLE) ×6 IMPLANT
NS IRRIG 1000ML POUR BTL (IV SOLUTION) ×3 IMPLANT
PACK BASIN DAY SURGERY FS (CUSTOM PROCEDURE TRAY) ×3 IMPLANT
PADDING CAST ABS 4INX4YD NS (CAST SUPPLIES) ×1
PADDING CAST ABS COTTON 4X4 ST (CAST SUPPLIES) ×2 IMPLANT
STOCKINETTE 6  STRL (DRAPES) ×3
STOCKINETTE 6 STRL (DRAPES) ×2 IMPLANT
STRIP SUTURE WOUND CLOSURE 1/2 (MISCELLANEOUS) IMPLANT
SUT ETHILON 3 0 PS 1 (SUTURE) ×1 IMPLANT
SUT MERSILENE 2.0 SH NDLE (SUTURE) ×3 IMPLANT
SUT MNCRL AB 3-0 PS2 18 (SUTURE) IMPLANT
SUT MNCRL AB 4-0 PS2 18 (SUTURE) IMPLANT
SUT MON AB 5-0 PS2 18 (SUTURE) IMPLANT
SYR 10ML LL (SYRINGE) ×6 IMPLANT
SYR BULB EAR ULCER 3OZ GRN STR (SYRINGE) ×3 IMPLANT
TOWEL OR 17X26 10 PK STRL BLUE (TOWEL DISPOSABLE) ×3 IMPLANT
UNDERPAD 30X36 HEAVY ABSORB (UNDERPADS AND DIAPERS) ×3 IMPLANT

## 2021-05-08 NOTE — Discharge Instructions (Addendum)
Attached to paper chart from [facility] Post Anesthesia Home Care Instructions  Activity: Get plenty of rest for the remainder of the day. A responsible adult should stay with you for 24 hours following the procedure.  For the next 24 hours, DO NOT: -Drive a car -Advertising copywriter -Drink alcoholic beverages -Take any medication unless instructed by your physician -Make any legal decisions or sign important papers.  Meals: Start with liquid foods such as gelatin or soup. Progress to regular foods as tolerated. Avoid greasy, spicy, heavy foods. If nausea and/or vomiting occur, drink only clear liquids until the nausea and/or vomiting subsides. Call your physician if vomiting continues.  Special Instructions/Symptoms: Your throat may feel dry or sore from the anesthesia or the breathing tube placed in your throat during surgery. If this causes discomfort, gargle with warm salt water. The discomfort should disappear within 24 hours.  If you had a scopolamine patch placed behind your ear for the management of post- operative nausea and/or vomiting:  1. The medication in the patch is effective for 72 hours, after which it should be removed.  Wrap patch in a tissue and discard in the trash. Wash hands thoroughly with soap and water. 2. You may remove the patch earlier than 72 hours if you experience unpleasant side effects which may include dry mouth, dizziness or visual disturbances. 3. Avoid touching the patch. Wash your hands with soap and water after contact with the patch.   Regional Anesthesia Blocks  1. Numbness or the inability to move the "blocked" extremity may last from 3-48 hours after placement. The length of time depends on the medication injected and your individual response to the medication. If the numbness is not going away after 48 hours, call your surgeon.  2. The extremity that is blocked will need to be protected until the numbness is gone and the  Strength  has returned. Because you cannot feel it, you will need to take extra care to avoid injury. Because it may be weak, you may have difficulty moving it or using it. You may not know what position it is in without looking at it while the block is in effect.  3. For blocks in the legs and feet, returning to weight bearing and walking needs to be done carefully. You will need to wait until the numbness is entirely gone and the strength has returned. You should be able to move your leg and foot normally before you try and bear weight or walk. You will need someone to be with you when you first try to ensure you do not fall and possibly risk injury.  4. Bruising and tenderness at the needle site are common side effects and will resolve in a few days.  5. Persistent numbness or new problems with movement should be communicated to the surgeon or the [facility] [phone] [facility] [phone]).

## 2021-05-08 NOTE — Anesthesia Preprocedure Evaluation (Signed)
Anesthesia Evaluation  Patient identified by MRN, date of birth, ID band Patient awake    Reviewed: Allergy & Precautions, NPO status , Patient's Chart, lab work & pertinent test results  Airway Mallampati: II  TM Distance: >3 FB Neck ROM: Full    Dental no notable dental hx. (+) Teeth Intact   Pulmonary asthma , sleep apnea , Patient abstained from smoking., former smoker,   OSA 05-04-2021 pt stated has not used cpap since 05/ 2021) followed by dr byrum;  study in epic 02-16-2019 moderate , titrated cpap 08-28-2019    Pulmonary exam normal breath sounds clear to auscultation       Cardiovascular hypertension, Pt. on medications Normal cardiovascular exam Rhythm:Regular Rate:Normal     Neuro/Psych PSYCHIATRIC DISORDERS Anxiety Depression negative neurological ROS     GI/Hepatic Neg liver ROS, GERD  Medicated,  Endo/Other  diabetes, Poorly Controlled, Type 2, Insulin DependentMorbid obesity  Renal/GU negative Renal ROS     Musculoskeletal   Abdominal (+) + obese,   Peds  Hematology negative hematology ROS (+)   Anesthesia Other Findings   Reproductive/Obstetrics negative OB ROS                             Anesthesia Physical  Anesthesia Plan  ASA: 3  Anesthesia Plan: General and Regional   Post-op Pain Management: Ofirmev IV (intra-op)   Induction: Intravenous  PONV Risk Score and Plan: 0 and Treatment may vary due to age or medical condition, Ondansetron, Dexamethasone and Midazolam  Airway Management Planned: Oral ETT and LMA  Additional Equipment: None  Intra-op Plan:   Post-operative Plan: Extubation in OR  Informed Consent: I have reviewed the patients History and Physical, chart, labs and discussed the procedure including the risks, benefits and alternatives for the proposed anesthesia with the patient or authorized representative who has indicated his/her understanding  and acceptance.     Dental advisory given  Plan Discussed with: CRNA  Anesthesia Plan Comments:         Anesthesia Quick Evaluation

## 2021-05-08 NOTE — Brief Op Note (Signed)
05/08/2021  2:46 PM  PATIENT:  Sarah Garrison  40 y.o. female  PRE-OPERATIVE DIAGNOSIS:  PLANTAR FASCITIS  POST-OPERATIVE DIAGNOSIS:  PLANTAR FASCITIS  PROCEDURE:  Procedure(s) with comments: ENDOSCOPIC PLANTAR FASCIOTOMY (Right) - WITH BLOCK PLATELET RICH PLASMA INJECTION HEELS (Bilateral)  SURGEON:  Surgeon(s) and Role:    * Asencion Islam, DPM - Primary  PHYSICIAN ASSISTANT:   ASSISTANTS: none   ANESTHESIA:   regional  EBL:  10cc   BLOOD ADMINISTERED:none  DRAINS: none   LOCAL MEDICATIONS USED:  NONE  SPECIMEN:  No Specimen  DISPOSITION OF SPECIMEN:  N/A  COUNTS:  YES  TOURNIQUET:   Total Tourniquet Time Documented: Calf (Right) - 19 minutes Total: Calf (Right) - 19 minutes   DICTATION: .Note written in EPIC  PLAN OF CARE: Discharge to home after PACU  PATIENT DISPOSITION:  PACU - hemodynamically stable.   Delay start of Pharmacological VTE agent (>24hrs) due to surgical blood loss or risk of bleeding: no

## 2021-05-08 NOTE — Op Note (Signed)
PREOPERATIVE DIAGNOSES:  1. Chronic Plantar fasciitis bilateral   POSTOPERATIVE DIAGNOSIS:  Same   OPERATION PERFORMED:  1.  Endoscopic plantar fasciotomy Right 2.  Injection PRP left and right heel   SURGEON: Asencion Islam, DPM    ASSISTANT: None   ESTIMATED BLOOD LOSS:  10    HEMOSTASIS:  Ankle tourniquet on right   INJECTABLES:  Left and right heel PRP    INDICATIONS FOR OPERATION:  The patient is a 40 y.o. female with clinical and radiographic signs and symptoms consistent with the above-stated diagnosis.The patient has chosen surgical correction of the diagnosis and has consented for surgery. All risks, benefits and complications have been explained to the patient including but not limited to recurrence of the deformity, dehiscence of incision site and the need for further surgery. The patient understands. No guarantees were made or implied to the outcome of the surgery.    DESCRIPTION OF OPERATION:  The patient was brought into the operating room and positioned in OR table in the Prone position. Following appropriate padding of all bony areas tourniquet was placed at right ankle but not yet inflated. Following the foot was then prepped and draped.   Attention was directed towards the Right foot where Esmarch bandage was placed tourniquet was inflated attention was then directed to the surgical site at the medial calcaneal tubercle of the right heel.  Using a marking pen the anatomical area was then identified and then utilizing utilizing a 15 blade a small stab incision was made at the medial aspect of the right heel at the junction of the plantar fascia.  Then utilizing a hemostat dissection was continued through subcutaneous tissue then utilizing a tissue Freer the plane underneath the plantar fascia was identified and then the endoscopic set and camera was inserted to visualize the plantar fascia once visualized and utilizing a triangular and hook blade the plantar fascia was  resected however due to patient body habitus and a lot of fat in the area it was difficult to clearly visualize the complete resection of the plantar fascial thus it was decided to extend the medial incision and utilized a tenotomy scissor to further resect the plantar fascia once resected the foot was then flexible with noted release of the plantar fascia. Then the surgical area was copiously flushed with saline and primarily closed utilizing  3-0 Nylon in all areas. The right heel was injected with 1 cc of PRP  via plantar approach at the insertion of the plantar fascia of the right heel. The foot was then dressed with Xeroform 4 x 4, Kerlix and Ace wrap tourniquet was deflated and a prompt hyperemic response was noted to all digits of the right foot.   Attention was then directed to the left heel at the plantar fascia where the skin was prepped using alcohol and then a local injection of 1 cc of PRP injected via plantar approach at the insertion of the plantar fascia of the left heel.  Band-Aid dressing was applied.    The patient tolerated the procedures and anesthesia well and was transported from the operating room to the recovery with vital signs stable and vascular status intact to all aspects of the patient's right lower extremity and the left foot.   Following a period of postoperative monitoring, the patient was discharged home with oral and written instructions per Dr. Marylene Land. The patient was given postop prescriptions. The patient will remain non- weightbearing with cam boot  and crutches and is to follow  up in office in 1 week for continued post op care.

## 2021-05-08 NOTE — Anesthesia Postprocedure Evaluation (Signed)
Anesthesia Post Note  Patient: Sarah Garrison  Procedure(s) Performed: ENDOSCOPIC PLANTAR FASCIOTOMY (Right) PLATELET RICH PLASMA INJECTION HEELS (Bilateral)     Patient location during evaluation: PACU Anesthesia Type: Regional and General Level of consciousness: awake and alert Pain management: pain level controlled Vital Signs Assessment: post-procedure vital signs reviewed and stable Respiratory status: spontaneous breathing, nonlabored ventilation, respiratory function stable and patient connected to nasal cannula oxygen Cardiovascular status: blood pressure returned to baseline and stable Postop Assessment: no apparent nausea or vomiting Anesthetic complications: no   No notable events documented.  Last Vitals:  Vitals:   05/08/21 1457 05/08/21 1530  BP: (!) 123/91 130/86  Pulse: (!) 105 93  Resp:  (!) 27  Temp: 36.7 C 36.7 C  SpO2: 99% 97%    Last Pain:  Vitals:   05/08/21 1256  TempSrc: Oral  PainSc: 0-No pain                 Ethyle Tiedt

## 2021-05-08 NOTE — Progress Notes (Signed)
Assisted Dr. Oddono with right, ultrasound guided, popliteal block. Side rails up, monitors on throughout procedure. See vital signs in flow sheet. Tolerated Procedure well. 

## 2021-05-08 NOTE — H&P (Signed)
H&P: Podiatry   GUR:KYHCWC M Sarah Garrison 40 y.o. female  patient with a history of chronic heel pain seen on several occassions in office complaining of R>L heel pain; Patient has tried multiple conservative treatments and opted for Surgical management. Patient had pre-op physical and clearance for EPF right foot and bilateral PRP injections  completed. Patient met this PM in pre-op holding area; informed consent reviewed and signed. All questions answered.  Right/left foot/surgical site marked.  This am patient denies any other pedal complaints. Denies Nausea/fever/vomiting/chills/night sweats/overnight events. Confirms NPO since midnight.     Patient Active Problem List   Diagnosis Date Noted   Hip pain 12/08/2019   Hyperlipidemia associated with type 2 diabetes mellitus ([place]) 11/23/2019   History of COVID-19 10/27/2019   Stuttering 10/27/2019   Depression with anxiety 10/27/2019   Obesity, Class III, BMI 40-49.9 (morbid obesity) ([place]) 06/30/2019   OSA (obstructive sleep apnea) 02/16/2019   Essential hypertension 06/22/2018   Uncontrolled type 2 diabetes mellitus with complication, with long-term current use of insulin 12/13/2016   Asthma 07/30/2016   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/07/2016   DM neuropathy, type II diabetes mellitus ([place]) 08/24/2015    No current facility-administered medications on file prior to encounter.   Current Outpatient Medications on File Prior to Encounter  Medication Sig Dispense Refill   insulin aspart (NOVOLOG) 100 UNIT/ML injection USE VIA INSULIN PUMP. MAX TOTAL DAILY DOSE OF 300 UNITS 90 mL 5   insulin aspart (NOVOLOG) 100 UNIT/ML injection USE VIA INSULIN PUMP. MAX TOTAL DAILY DOSE OF 300 UNITS 90 mL 5   Insulin Human (INSULIN PUMP) SOLN Inject 1 each into the skin See admin instructions. Medication: Novolog 100 units/ml injection. Takes 100 units via pump per patient.     lisinopril (ZESTRIL) 20 MG tablet Take 1 tablet (20 mg total) by mouth daily. (Patient  taking differently: Take 20 mg by mouth daily.) 90 tablet 2   medroxyPROGESTERone Acetate (DEPO-PROVERA IM) Inject into the muscle every 3 (three) months.     albuterol (VENTOLIN HFA) 108 (90 Base) MCG/ACT inhaler Inhale 1-2 puffs into the lungs every 6 (six) hours as needed for wheezing or shortness of breath. (Patient taking differently: Inhale 1-2 puffs into the lungs every 6 (six) hours as needed for wheezing or shortness of breath.) 18 g 1   Continuous Blood Gluc Receiver (DEXCOM G6 RECEIVER) DEVI 1 each by Misc.(Non-Drug; Combo Route) route daily.     Continuous Blood Gluc Sensor (DEXCOM G6 SENSOR) MISC Use to continuously monitor glucose.  Change every 10 days 3 each 0   Continuous Blood Gluc Transmit (DEXCOM G6 TRANSMITTER) MISC USE TO CHECK BLOOD SUGAR EVERY 3 MONTHS     fluticasone-salmeterol (ADVAIR HFA) 115-21 MCG/ACT inhaler Inhale 2 puffs into the lungs 2 (two) times daily. (Patient not taking: Reported on 05/04/2021) 1 Inhaler 12   insulin aspart (NOVOLOG FLEXPEN) 100 UNIT/ML FlexPen Inject into the skin 3 (three) times daily before meals. ONLY WHEN INSULIN PUMP MALFUNCTION'S PER ENDOCRINOLOGY DIRECTION (Patient not taking: Reported on 05/04/2021)     insulin glargine, 1 Unit Dial, (TOUJEO SOLOSTAR) 300 UNIT/ML Solostar Pen Administer 50 units once daily only if insulin pump malfunction (Patient not taking: Reported on 05/04/2021) 9 mL 1   Insulin Syringe-Needle U-100 31G X 5/16" 1 ML MISC inject 10 units into the skin 3 times daily. use to inject novolog based on insulin to card ratio- max daily dose 150 units 100 each 2   Misc. Devices MISC CPAP therapy on autopap  5-15.  Needs Small size Fisher&Paykel Full Face Mask Simplus  mask and heated humidification. 1 each 0   ondansetron (ZOFRAN ODT) 8 MG disintegrating tablet Take 1 tablet (8 mg total) by mouth every 8 (eight) hours as needed for nausea or vomiting. 20 tablet 0   pantoprazole (PROTONIX) 40 MG tablet TAKE 1 TABLET (40 MG TOTAL) BY  MOUTH DAILY. (Patient taking differently: Take 40 mg by mouth daily.) 30 tablet 3   Syringe/Needle, Disp, (SYRINGE 3CC/21GX1") 21G X 1" 3 ML MISC Use to fill pump cartridge with insulin as directed.     TRUEPLUS PEN NEEDLES 32G X 4 MM MISC USE 3 TIMES DAILY AS DIRECTED 100 each 5   VITAMIN D PO Take 1 tablet by mouth 2 (two) times daily. (Patient not taking: Reported on 05/04/2021)     [DISCONTINUED] ARIPiprazole (ABILIFY) 5 MG tablet Take 1 tablet (5 mg total) by mouth daily. 30 tablet 2       Allergies  Allergen Reactions   Morphine And Related Shortness Of Breath   Glyburide Diarrhea and Other (See Comments)    Reaction:  Nose bleeds    Hydrocodone Other (See Comments)    Pt states that this medication makes her "dream about rabbits chasing her"   Ivp Dye [Iodinated Diagnostic Agents] Other (See Comments)    Shortness of breath.     Oxycodone Other (See Comments)    Pt states that this medication makes her "dream about rabbits chasing" her.      Social History   Socioeconomic History   Marital status: Single    Spouse name: Not on file   Number of children: 4   Years of education: Not on file   Highest education level: Not on file  Occupational History   Not on file  Tobacco Use   Smoking status: Former    Packs/day: 0.10    Years: 2.00    Pack years: 0.20    Types: Cigarettes    Quit date: 11/27/2017    Years since quitting: 3.4   Smokeless tobacco: Never  Vaping Use   Vaping Use: Some days   Devices: Elfvar  (05-04-2021 per pt last vaped approx 2 wks ago)  Substance and Sexual Activity   Alcohol use: No   Drug use: Never   Sexual activity: Yes    Birth control/protection: Surgical, Injection  Other Topics Concern   Not on file  Social History Narrative   Right handed   No caffeine use   Diet coke sometimes    Social Determinants of Health   Financial Resource Strain: Not on file  Food Insecurity: Not on file  Transportation Needs: Not on file  Physical  Activity: Not on file  Stress: Not on file  Social Connections: Not on file  Intimate Partner Violence: Not on file    Past Surgical History:  Procedure Laterality Date   Marissa Bilateral 09/29/2006   _0 ;   PPTL   VENTRAL HERNIA REPAIR N/A 11/27/2017   Procedure: [place];  Surgeon: Erroll Luna, MD;  Location: [facility];  Service: General;  Laterality: N/A;   WISDOM TOOTH EXTRACTION      Family History  Problem Relation Age of Onset   Asthma Mother    Kidney failure Mother    Brain cancer Mother    Asthma Father    Other Father        surgery on stomach but  don't know from what   Diabetes Brother    Diabetes Maternal Grandmother      REVIEW OF SYSTEMS:  Noncontributory     PHYSICAL EXAMINATION:   Today's Vitals   05/04/21 1518 05/08/21 1256 05/08/21 1311  BP:  (!) 161/103 (!) 143/101  Pulse:  (!) 103 (!) 112  Resp:  18 16  Temp:  98.2 F (36.8 C)   TempSrc:  Oral   SpO2:  100% 98%  Weight: 118.4 kg 119.3 kg   Height: _0  (1.626 m) _1  (1.626 m)   PainSc:  0-No pain     GENERAL:  Well-developed, well-nourished, in no acute distress.  Alert  and cooperative.  LOWER EXTREMITY EXAM:UNCHANGED FROM PREVIOUS  DERMATOLOGY: Skin warm and supple bilateral, no open lesions, nails within normal limits. VASCULAR: Dorsalis Pedis 2/4 and Posterior Tibial  2/4 pedal pulses bilateral, Temperature gradient within normal limits, Capillary fill time 3 seconds, positive pedal hair growth present bilateral. NEUROLOGY: Gross sensation present with light touch bilateral MUSCULOSKELETAL: +foot deformity/ pain with palpation to area of concern at heels R>L.    XRAY, RIGHT/LEFT FOOT- Imaging in chart   ASSESSMENTS:  1. Chronic heel pain/Plantar fasciitis R>L    PLAN OF CARE: Patient seen and evaluated 1. History and physical completed 2. Patient NPO since midnight 3. Previous Imaging reviewed      4. Consent for surgery explained and obtained for EPF on Right and PRP injections bilateral; risk and benefits explained; all questions answered and no guarantees granted. 5. Patient to undergo above surgical procedure 6. Case discussed with patient and to call son after the procedure 7. To resume all home meds post-procedure and to give prn pain meds, anti-nausea, and anti-constipation (already on meds for this ) medications post op 8. Will continue to follow closely/ see post-operative in the office within 1 week.  Landis Martins, DPM

## 2021-05-08 NOTE — Anesthesia Procedure Notes (Signed)
Procedure Name: LMA Insertion Date/Time: 05/08/2021 2:11 PM Performed by: Jessica Priest, CRNA Pre-anesthesia Checklist: Patient identified, Emergency Drugs available, Suction available, Patient being monitored and Timeout performed Patient Re-evaluated:Patient Re-evaluated prior to induction Oxygen Delivery Method: Circle system utilized Preoxygenation: Pre-oxygenation with 100% oxygen Induction Type: IV induction Ventilation: Mask ventilation without difficulty LMA: LMA inserted LMA Size: 4.0 Number of attempts: 1 Airway Equipment and Method: Bite block Placement Confirmation: positive ETCO2, breath sounds checked- equal and bilateral and CO2 detector Tube secured with: Tape Dental Injury: Teeth and Oropharynx as per pre-operative assessment

## 2021-05-08 NOTE — Progress Notes (Signed)
Referral home health/Order in chart

## 2021-05-08 NOTE — Anesthesia Procedure Notes (Signed)
Anesthesia Regional Block: Popliteal block   Pre-Anesthetic Checklist: , timeout performed,  Correct Patient, Correct Site, Correct Laterality,  Correct Procedure, Correct Position, site marked,  Risks and benefits discussed,  Surgical consent,  Pre-op evaluation,  At surgeon's request and post-op pain management  Laterality: Right  Prep: chloraprep       Needles:  Injection technique: Single-shot  Needle Type: Echogenic Stimulator Needle     Needle Length: 5cm  Needle Gauge: 22     Additional Needles:   Procedures:, nerve stimulator,,, ultrasound used (permanent image in chart),,     Nerve Stimulator or Paresthesia:  Response: foot, 0.45 mA  Additional Responses:   Narrative:  Start time: 05/08/2021 1:17 PM End time: 05/08/2021 1:22 PM Injection made incrementally with aspirations every 5 mL.  Performed by: Personally  Anesthesiologist: Bethena Midget, MD  Additional Notes: Functioning IV was confirmed and monitors were applied.  A 5mm 22ga Arrow echogenic stimulator needle was used. Sterile prep and drape,hand hygiene and sterile gloves were used. Ultrasound guidance: relevant anatomy identified, needle position confirmed, local anesthetic spread visualized around nerve(s)., vascular puncture avoided.  Image printed for medical record. Negative aspiration and negative test dose prior to incremental administration of local anesthetic. The patient tolerated the procedure well.

## 2021-05-08 NOTE — OR Nursing (Signed)
16:00 Ortho tech Scott  places cam boot on pt operative foot-right.  Pt fitted and instructed on use of crutches by [employer].

## 2021-05-08 NOTE — Transfer of Care (Signed)
Immediate Anesthesia Transfer of Care Note  Patient: Sarah Garrison  Procedure(s) Performed: Procedure(s) (LRB): ENDOSCOPIC PLANTAR FASCIOTOMY (Right) PLATELET RICH PLASMA INJECTION HEELS (Bilateral)  Patient Location: PACU  Anesthesia Type: General  Level of Consciousness: awake, sedated, patient cooperative and responds to stimulation  Airway & Oxygen Therapy: Patient Spontanous Breathing and Patient connected to [place] 02 and soft FM   Post-op Assessment: Report given to PACU RN, Post -op Vital signs reviewed and stable and Patient moving all extremities  Post vital signs: Reviewed and stable  Complications: No apparent anesthesia complications

## 2021-05-09 ENCOUNTER — Telehealth: Payer: Self-pay | Admitting: *Deleted

## 2021-05-09 ENCOUNTER — Telehealth: Payer: Self-pay | Admitting: Sports Medicine

## 2021-05-09 DIAGNOSIS — Z9889 Other specified postprocedural states: Secondary | ICD-10-CM

## 2021-05-09 DIAGNOSIS — M79671 Pain in right foot: Secondary | ICD-10-CM

## 2021-05-09 DIAGNOSIS — M722 Plantar fascial fibromatosis: Secondary | ICD-10-CM

## 2021-05-09 NOTE — Telephone Encounter (Signed)
Informed the patient that this is a normal healing process

## 2021-05-09 NOTE — Telephone Encounter (Signed)
Post op check phone call made to patient. Patient reports that she is having pain after the block wore off and she had pain; I advised her to continue with her tramadol and that she can take naprosyn with it if needed. Patient also reports that she is having a hard time using crutches; I advised patient to do the best she can or to rent a knee scooter on her own. Patient also reports that she lost her nose ring; advised her to contact the surgery center regarding this. Patient reports that she needs a later appt next week for her POV1; I advised her to call the office during normal business hours to have her appt time changed. I wished patient a good day and advised her to call the office if there is any other ?s or concerns. -Dr. Marylene Land

## 2021-05-09 NOTE — Telephone Encounter (Signed)
Spoke with patient giving Dr Marylene Land message,verbalized understanding and is continuing to ice and elevate.

## 2021-05-09 NOTE — Telephone Encounter (Signed)
-----   Message from Asencion Islam, [place] sent at 05/08/2021  1:36 PM EST ----- Regarding: Home Aide Patient is requesting Home aide to help her with Activities of Daily living like showering/bathing/ tolieting/food. She is having surgery today. [facility] and requests that we send the referral for a home aide to them. Please send my last note and order request. Thanks

## 2021-05-09 NOTE — Telephone Encounter (Signed)
Patient is calling because her right side of leg is numb, is this a normal process of medication wearing off? Please advise.

## 2021-05-09 NOTE — Telephone Encounter (Signed)
[place] w/ [facility] is calling because patient's insurance is out of network for them. She advised to try [facility] in [place], will come out and to do an assessment to determine how many hours patient may need.. Please advise.

## 2021-05-09 NOTE — Telephone Encounter (Signed)
Called and faxed referral for home health to [facility], received confirmation. They will contact patient to schedule.

## 2021-05-09 NOTE — Telephone Encounter (Signed)
Faxed the order to [employer] of [phone]),received confirmation

## 2021-05-10 ENCOUNTER — Encounter (HOSPITAL_BASED_OUTPATIENT_CLINIC_OR_DEPARTMENT_OTHER): Payer: Self-pay | Admitting: Sports Medicine

## 2021-05-11 NOTE — Telephone Encounter (Signed)
Spoke with [employer] and they denied patient , has to go thru managed care w/ [employer] med[employer], faxed office notes  but still need cpt code, diagnosis code,called them and have to go on website(provider. [email or web address] to complete form with this information,fax back to them. Should this be a stat order?Please advise.

## 2021-05-12 ENCOUNTER — Other Ambulatory Visit: Payer: Self-pay

## 2021-05-12 NOTE — Telephone Encounter (Signed)
Faxed the form to your [place] office, in clinic today

## 2021-05-15 NOTE — Telephone Encounter (Signed)
Faxed to [facility], received confirmation, waiting for their authorization

## 2021-05-18 ENCOUNTER — Ambulatory Visit (INDEPENDENT_AMBULATORY_CARE_PROVIDER_SITE_OTHER): Payer: Medicaid Other | Admitting: Sports Medicine

## 2021-05-18 ENCOUNTER — Other Ambulatory Visit: Payer: Self-pay

## 2021-05-18 DIAGNOSIS — E1140 Type 2 diabetes mellitus with diabetic neuropathy, unspecified: Secondary | ICD-10-CM

## 2021-05-18 DIAGNOSIS — M722 Plantar fascial fibromatosis: Secondary | ICD-10-CM

## 2021-05-18 DIAGNOSIS — M79671 Pain in right foot: Secondary | ICD-10-CM

## 2021-05-18 DIAGNOSIS — Z9889 Other specified postprocedural states: Secondary | ICD-10-CM

## 2021-05-18 DIAGNOSIS — Z794 Long term (current) use of insulin: Secondary | ICD-10-CM

## 2021-05-18 MED ORDER — PREGABALIN 75 MG PO CAPS: 75.0000 mg | ORAL_CAPSULE | Freq: Two times a day (BID) | ORAL | 1 refills | Status: DC

## 2021-05-18 NOTE — Progress Notes (Signed)
Subjective: Sarah Garrison is a 40 y.o. female patient seen today in office for POV #1 (DOS 05-08-21), S/P Right EPF and bil heel PRP injection. Patient admits a sharp shooting pain at surgical site,  States that she is ready to walk, reports that boot hurts and is making the back of the heel sore, denies headache, chest pain, shortness of breath, nausea, vomiting, fever, or chills currently. No other issues noted.   Patient Active Problem List   Diagnosis Date Noted   Hip pain 12/08/2019   Hyperlipidemia associated with type 2 diabetes mellitus ([facility]) 11/23/2019   History of COVID-19 10/27/2019   Stuttering 10/27/2019   Depression with anxiety 10/27/2019   Obesity, Class III, BMI 40-49.9 (morbid obesity) ([facility]) 06/30/2019   OSA (obstructive sleep apnea) 02/16/2019   Essential hypertension 06/22/2018   Uncontrolled type 2 diabetes mellitus with complication, with long-term current use of insulin 12/13/2016   Asthma 07/30/2016   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/07/2016   DM neuropathy, type II diabetes mellitus ([facility]) 08/24/2015    Current Outpatient Medications on File Prior to Visit  Medication Sig Dispense Refill   albuterol (VENTOLIN HFA) 108 (90 Base) MCG/ACT inhaler Inhale 1-2 puffs into the lungs every 6 (six) hours as needed for wheezing or shortness of breath. (Patient taking differently: Inhale 1-2 puffs into the lungs every 6 (six) hours as needed for wheezing or shortness of breath.) 18 g 1   atorvastatin (LIPITOR) 20 MG tablet Take 1 tablet (20 mg total) by mouth daily. 90 tablet 1   Continuous Blood Gluc Receiver (DEXCOM G6 RECEIVER) DEVI 1 each by Misc.(Non-Drug; Combo Route) route daily.     Continuous Blood Gluc Sensor (DEXCOM G6 SENSOR) MISC Use to continuously monitor glucose.  Change every 10 days 3 each 0   Continuous Blood Gluc Transmit (DEXCOM G6 TRANSMITTER) MISC USE TO CHECK BLOOD SUGAR EVERY 3 MONTHS     fluticasone-salmeterol (ADVAIR HFA) 115-21 MCG/ACT inhaler Inhale 2 puffs  into the lungs 2 (two) times daily. (Patient not taking: Reported on 05/04/2021) 1 Inhaler 12   insulin aspart (NOVOLOG FLEXPEN) 100 UNIT/ML FlexPen Inject into the skin 3 (three) times daily before meals. ONLY WHEN INSULIN PUMP MALFUNCTION'S PER ENDOCRINOLOGY DIRECTION (Patient not taking: Reported on 05/04/2021)     insulin aspart (NOVOLOG) 100 UNIT/ML injection USE VIA INSULIN PUMP. MAX TOTAL DAILY DOSE OF 300 UNITS 90 mL 5   insulin aspart (NOVOLOG) 100 UNIT/ML injection USE VIA INSULIN PUMP. MAX TOTAL DAILY DOSE OF 300 UNITS 90 mL 5   insulin glargine, 1 Unit Dial, (TOUJEO SOLOSTAR) 300 UNIT/ML Solostar Pen Administer 50 units once daily only if insulin pump malfunction (Patient not taking: Reported on 05/04/2021) 9 mL 1   Insulin Human (INSULIN PUMP) SOLN Inject 1 each into the skin See admin instructions. Medication: Novolog 100 units/ml injection. Takes 100 units via pump per patient.     Insulin Syringe-Needle U-100 31G X 5/16" 1 ML MISC inject 10 units into the skin 3 times daily. use to inject novolog based on insulin to card ratio- max daily dose 150 units 100 each 2   linaclotide (LINZESS) 72 MCG capsule Take 1 capsule (72 mcg total) by mouth daily before breakfast. (Patient taking differently: Take 72 mcg by mouth daily before breakfast.) 30 capsule 3   lisinopril (ZESTRIL) 20 MG tablet Take 1 tablet (20 mg total) by mouth daily. (Patient taking differently: Take 20 mg by mouth daily.) 90 tablet 2   medroxyPROGESTERone Acetate (DEPO-PROVERA IM) Inject into  the muscle every 3 (three) months.     Misc. Devices MISC CPAP therapy on autopap 5-15.  Needs Small size Fisher&Paykel Full Face Mask Simplus  mask and heated humidification. 1 each 0   naproxen (EC NAPROSYN) 500 MG EC tablet Take 1 tablet (500 mg total) by mouth 2 (two) times daily with a meal. 60 tablet 2   ondansetron (ZOFRAN ODT) 8 MG disintegrating tablet Take 1 tablet (8 mg total) by mouth every 8 (eight) hours as needed for nausea  or vomiting. 20 tablet 0   pantoprazole (PROTONIX) 40 MG tablet TAKE 1 TABLET (40 MG TOTAL) BY MOUTH DAILY. (Patient taking differently: Take 40 mg by mouth daily.) 30 tablet 3   promethazine (PHENERGAN) 12.5 MG tablet Take 1 tablet (12.5 mg total) by mouth every 6 (six) hours as needed for nausea or vomiting. 30 tablet 0   Syringe/Needle, Disp, (SYRINGE 3CC/21GX1") 21G X 1" 3 ML MISC Use to fill pump cartridge with insulin as directed.     traMADol (ULTRAM) 50 MG tablet Take 1 tablet (50 mg total) by mouth every 6 (six) hours as needed. 20 tablet 0   TRUEPLUS PEN NEEDLES 32G X 4 MM MISC USE 3 TIMES DAILY AS DIRECTED 100 each 5   VITAMIN D PO Take 1 tablet by mouth 2 (two) times daily. (Patient not taking: Reported on 05/04/2021)     [DISCONTINUED] ARIPiprazole (ABILIFY) 5 MG tablet Take 1 tablet (5 mg total) by mouth daily. 30 tablet 2   No current facility-administered medications on file prior to visit.    Allergies  Allergen Reactions   Morphine And Related Shortness Of Breath   Glyburide Diarrhea and Other (See Comments)    Reaction:  Nose bleeds    Hydrocodone Other (See Comments)    Pt states that this medication makes her "dream about rabbits chasing her"   Ivp Dye [Iodinated Diagnostic Agents] Other (See Comments)    Shortness of breath.     Oxycodone Other (See Comments)    Pt states that this medication makes her "dream about rabbits chasing" her.      Objective: There were no vitals filed for this visit.  General: No acute distress, AAOx3  Right foot: Sutures intact with no gapping or dehiscence at surgical site, mild swelling to right heel, no erythema, no warmth, no drainage, no signs of infection noted, Capillary fill time <3 seconds in all digits, gross sensation present via light touch to right foot. Guarding due to pain on the right. No pain with calf compression.   Assessment and Plan:  Problem List Items Addressed This Visit       Endocrine   DM neuropathy, type  II diabetes mellitus ([facility])   Other Visit Diagnoses     S/P foot surgery    -  Primary   Intractable right heel pain       Plantar fasciitis, right       Plantar fasciitis, left            -Patient seen and evaluated -Applied dry sterile dressing to surgical site right foot secured with ACE wrap and stockinet  -Advised patient to make sure to keep dressings clean, dry, and intact to right surgical site, adjusting the ACE as needed  -Advised patient to continue with cam boot and nonweightbearing with use of knee scooter -Advised patient to limit activity to necessity and avoid putting pressure or walking on the foot may toe-touch weight-bear but may refrain from putting pressure  on the heel -Advised patient to ice and elevate as necessary  -Rx Lyrica for sharp shooting pains -Will plan for suture removal, allowing patient to shower, and allowing patient to weight-bear completely at next office visit. In the meantime, patient to call office if any issues or problems arise.   Asencion Islam, DPM

## 2021-05-22 ENCOUNTER — Other Ambulatory Visit: Payer: Self-pay

## 2021-05-23 ENCOUNTER — Other Ambulatory Visit: Payer: Self-pay

## 2021-05-24 ENCOUNTER — Other Ambulatory Visit: Payer: Self-pay

## 2021-05-24 ENCOUNTER — Telehealth: Payer: Self-pay | Admitting: *Deleted

## 2021-05-25 ENCOUNTER — Encounter: Payer: Medicaid Other | Admitting: Sports Medicine

## 2021-05-25 ENCOUNTER — Other Ambulatory Visit: Payer: Self-pay

## 2021-05-25 ENCOUNTER — Ambulatory Visit (INDEPENDENT_AMBULATORY_CARE_PROVIDER_SITE_OTHER): Payer: Medicaid Other | Admitting: Sports Medicine

## 2021-05-25 ENCOUNTER — Encounter: Payer: Self-pay | Admitting: Sports Medicine

## 2021-05-25 DIAGNOSIS — Z9889 Other specified postprocedural states: Secondary | ICD-10-CM

## 2021-05-25 DIAGNOSIS — M722 Plantar fascial fibromatosis: Secondary | ICD-10-CM

## 2021-05-25 DIAGNOSIS — E1140 Type 2 diabetes mellitus with diabetic neuropathy, unspecified: Secondary | ICD-10-CM

## 2021-05-25 DIAGNOSIS — Z794 Long term (current) use of insulin: Secondary | ICD-10-CM

## 2021-05-25 DIAGNOSIS — M79671 Pain in right foot: Secondary | ICD-10-CM

## 2021-05-25 MED ORDER — IBUPROFEN 800 MG PO TABS
800.0000 mg | ORAL_TABLET | Freq: Three times a day (TID) | ORAL | 0 refills | Status: DC | PRN
Start: 2021-05-25 — End: 2021-06-08

## 2021-05-25 NOTE — Progress Notes (Signed)
Subjective: Sarah Garrison is a 40 y.o. female patient seen today in office for POV #2 (DOS 05-08-21), S/P Right EPF and bil heel PRP injection. Patient admits some pain to right heel. No other issues noted.   Patient Active Problem List   Diagnosis Date Noted   Hip pain 12/08/2019   Hyperlipidemia associated with type 2 diabetes mellitus (HCC) 11/23/2019   History of COVID-19 10/27/2019   Stuttering 10/27/2019   Depression with anxiety 10/27/2019   Obesity, Class III, BMI 40-49.9 (morbid obesity) (HCC) 06/30/2019   OSA (obstructive sleep apnea) 02/16/2019   Essential hypertension 06/22/2018   Uncontrolled type 2 diabetes mellitus with complication, with long-term current use of insulin 12/13/2016   Asthma 07/30/2016   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/07/2016   DM neuropathy, type II diabetes mellitus (HCC) 08/24/2015    Current Outpatient Medications on File Prior to Visit  Medication Sig Dispense Refill   albuterol (VENTOLIN HFA) 108 (90 Base) MCG/ACT inhaler Inhale 1-2 puffs into the lungs every 6 (six) hours as needed for wheezing or shortness of breath. (Patient taking differently: Inhale 1-2 puffs into the lungs every 6 (six) hours as needed for wheezing or shortness of breath.) 18 g 1   atorvastatin (LIPITOR) 20 MG tablet Take 1 tablet (20 mg total) by mouth daily. 90 tablet 1   Continuous Blood Gluc Receiver (DEXCOM G6 RECEIVER) DEVI 1 each by Misc.(Non-Drug; Combo Route) route daily.     Continuous Blood Gluc Sensor (DEXCOM G6 SENSOR) MISC Use to continuously monitor glucose.  Change every 10 days 3 each 0   Continuous Blood Gluc Transmit (DEXCOM G6 TRANSMITTER) MISC USE TO CHECK BLOOD SUGAR EVERY 3 MONTHS     fluticasone-salmeterol (ADVAIR HFA) 115-21 MCG/ACT inhaler Inhale 2 puffs into the lungs 2 (two) times daily. (Patient not taking: Reported on 05/04/2021) 1 Inhaler 12   insulin aspart (NOVOLOG FLEXPEN) 100 UNIT/ML FlexPen Inject into the skin 3 (three) times daily before meals. ONLY  WHEN INSULIN PUMP MALFUNCTION'S PER ENDOCRINOLOGY DIRECTION (Patient not taking: Reported on 05/04/2021)     insulin aspart (NOVOLOG) 100 UNIT/ML injection USE VIA INSULIN PUMP. MAX TOTAL DAILY DOSE OF 300 UNITS 90 mL 5   insulin aspart (NOVOLOG) 100 UNIT/ML injection USE VIA INSULIN PUMP. MAX TOTAL DAILY DOSE OF 300 UNITS 90 mL 5   insulin glargine, 1 Unit Dial, (TOUJEO SOLOSTAR) 300 UNIT/ML Solostar Pen Administer 50 units once daily only if insulin pump malfunction (Patient not taking: Reported on 05/04/2021) 9 mL 1   Insulin Human (INSULIN PUMP) SOLN Inject 1 each into the skin See admin instructions. Medication: Novolog 100 units/ml injection. Takes 100 units via pump per patient.     Insulin Syringe-Needle U-100 31G X 5/16" 1 ML MISC inject 10 units into the skin 3 times daily. use to inject novolog based on insulin to card ratio- max daily dose 150 units 100 each 2   linaclotide (LINZESS) 72 MCG capsule Take 1 capsule (72 mcg total) by mouth daily before breakfast. (Patient taking differently: Take 72 mcg by mouth daily before breakfast.) 30 capsule 3   lisinopril (ZESTRIL) 20 MG tablet Take 1 tablet (20 mg total) by mouth daily. (Patient taking differently: Take 20 mg by mouth daily.) 90 tablet 2   medroxyPROGESTERone Acetate (DEPO-PROVERA IM) Inject into the muscle every 3 (three) months.     Misc. Devices MISC CPAP therapy on autopap 5-15.  Needs Small size Fisher[employer]kel Full Face Mask Simplus  mask and heated humidification. 1 each 0  naproxen (EC NAPROSYN) 500 MG EC tablet Take 1 tablet (500 mg total) by mouth 2 (two) times daily with a meal. 60 tablet 2   ondansetron (ZOFRAN ODT) 8 MG disintegrating tablet Take 1 tablet (8 mg total) by mouth every 8 (eight) hours as needed for nausea or vomiting. 20 tablet 0   pantoprazole (PROTONIX) 40 MG tablet TAKE 1 TABLET (40 MG TOTAL) BY MOUTH DAILY. (Patient taking differently: Take 40 mg by mouth daily.) 30 tablet 3   pregabalin (LYRICA) 75 MG  capsule Take 1 capsule (75 mg total) by mouth 2 (two) times daily. 30 capsule 1   promethazine (PHENERGAN) 12.5 MG tablet Take 1 tablet (12.5 mg total) by mouth every 6 (six) hours as needed for nausea or vomiting. 30 tablet 0   Syringe/Needle, Disp, (SYRINGE 3CC/21GX1") 21G X 1" 3 ML MISC Use to fill pump cartridge with insulin as directed.     traMADol (ULTRAM) 50 MG tablet Take 1 tablet (50 mg total) by mouth every 6 (six) hours as needed. 20 tablet 0   TRUEPLUS PEN NEEDLES 32G X 4 MM MISC USE 3 TIMES DAILY AS DIRECTED 100 each 5   VITAMIN D PO Take 1 tablet by mouth 2 (two) times daily. (Patient not taking: Reported on 05/04/2021)     [DISCONTINUED] ARIPiprazole (ABILIFY) 5 MG tablet Take 1 tablet (5 mg total) by mouth daily. 30 tablet 2   No current facility-administered medications on file prior to visit.    Allergies  Allergen Reactions   Morphine And Related Shortness Of Breath   Glyburide Diarrhea and Other (See Comments)    Reaction:  Nose bleeds    Hydrocodone Other (See Comments)    Pt states that this medication makes her "dream about rabbits chasing her"   Ivp Dye [Iodinated Diagnostic Agents] Other (See Comments)    Shortness of breath.     Oxycodone Other (See Comments)    Pt states that this medication makes her "dream about rabbits chasing" her.      Objective: There were no vitals filed for this visit.  General: No acute distress, AAOx3  Right foot: Sutures intact with no gapping or dehiscence at surgical site, mild swelling to right heel, no erythema, no warmth, no drainage, no signs of infection noted, Capillary fill time <3 seconds in all digits, gross sensation present via light touch to right foot. Guarding due to pain on the right. No pain with calf compression.   Assessment and Plan:  Problem List Items Addressed This Visit       Endocrine   DM neuropathy, type II diabetes mellitus (HCC)   Other Visit Diagnoses     S/P foot surgery    -  Primary    Intractable right heel pain       Plantar fasciitis, right       Plantar fasciitis, left           -Patient seen and evaluated -Sutures removed -Applied stockinette -May shower -Advised patient to continue with cam boot and now may weight-bear to tolerance -Rx Motrin -Recommend icing for edema control -Will plan for transition out of boot at next office visit. In the meantime, patient to call office if any issues or problems arise.   Asencion Islam, DPM

## 2021-05-27 ENCOUNTER — Emergency Department (HOSPITAL_COMMUNITY)
Admission: EM | Admit: 2021-05-27 | Discharge: 2021-05-27 | Disposition: A | Discharge status: Home / Self Care | Payer: Medicaid Other | Attending: Emergency Medicine | Admitting: Emergency Medicine

## 2021-05-27 ENCOUNTER — Other Ambulatory Visit: Payer: Self-pay

## 2021-05-27 ENCOUNTER — Emergency Department (HOSPITAL_COMMUNITY): Payer: Medicaid Other

## 2021-05-27 DIAGNOSIS — Z794 Long term (current) use of insulin: Secondary | ICD-10-CM | POA: Insufficient documentation

## 2021-05-27 DIAGNOSIS — I10 Essential (primary) hypertension: Secondary | ICD-10-CM | POA: Diagnosis not present

## 2021-05-27 DIAGNOSIS — Z87891 Personal history of nicotine dependence: Secondary | ICD-10-CM | POA: Insufficient documentation

## 2021-05-27 DIAGNOSIS — M79671 Pain in right foot: Secondary | ICD-10-CM

## 2021-05-27 DIAGNOSIS — M7989 Other specified soft tissue disorders: Secondary | ICD-10-CM | POA: Diagnosis not present

## 2021-05-27 DIAGNOSIS — G8929 Other chronic pain: Secondary | ICD-10-CM | POA: Insufficient documentation

## 2021-05-27 DIAGNOSIS — E1140 Type 2 diabetes mellitus with diabetic neuropathy, unspecified: Secondary | ICD-10-CM | POA: Diagnosis not present

## 2021-05-27 DIAGNOSIS — Z79899 Other long term (current) drug therapy: Secondary | ICD-10-CM | POA: Insufficient documentation

## 2021-05-27 DIAGNOSIS — M25571 Pain in right ankle and joints of right foot: Secondary | ICD-10-CM | POA: Diagnosis not present

## 2021-05-27 DIAGNOSIS — J45909 Unspecified asthma, uncomplicated: Secondary | ICD-10-CM | POA: Diagnosis not present

## 2021-05-27 DIAGNOSIS — Z8616 Personal history of COVID-19: Secondary | ICD-10-CM | POA: Insufficient documentation

## 2021-05-27 LAB — CBC WITH DIFFERENTIAL/PLATELET
Abs Immature Granulocytes: 0.04 10*3/uL (ref 0.00–0.07)
Basophils Absolute: 0 10*3/uL (ref 0.0–0.1)
Basophils Relative: 1 %
Eosinophils Absolute: 0.1 10*3/uL (ref 0.0–0.5)
Eosinophils Relative: 1 %
HCT: 41 % (ref 36.0–46.0)
Hemoglobin: 13.7 g/dL (ref 12.0–15.0)
Immature Granulocytes: 1 %
Lymphocytes Relative: 45 %
Lymphs Abs: 3.3 10*3/uL (ref 0.7–4.0)
MCH: 29.5 pg (ref 26.0–34.0)
MCHC: 33.4 g/dL (ref 30.0–36.0)
MCV: 88.4 fL (ref 80.0–100.0)
Monocytes Absolute: 0.5 10*3/uL (ref 0.1–1.0)
Monocytes Relative: 6 %
Neutro Abs: 3.3 10*3/uL (ref 1.7–7.7)
Neutrophils Relative %: 46 %
Platelets: 320 10*3/uL (ref 150–400)
RBC: 4.64 MIL/uL (ref 3.87–5.11)
RDW: 12.1 % (ref 11.5–15.5)
WBC: 7.2 10*3/uL (ref 4.0–10.5)
nRBC: 0 % (ref 0.0–0.2)

## 2021-05-27 LAB — BASIC METABOLIC PANEL
Anion gap: 8 (ref 5–15)
BUN: 9 mg/dL (ref 6–20)
CO2: 21 mmol/L — ABNORMAL LOW (ref 22–32)
Calcium: 8.8 mg/dL — ABNORMAL LOW (ref 8.9–10.3)
Chloride: 108 mmol/L (ref 98–111)
Creatinine, Ser: 0.78 mg/dL (ref 0.44–1.00)
GFR, Estimated: >60 mL/min (ref >60)
Glucose, Bld: 259 mg/dL — ABNORMAL HIGH (ref 70–99)
Potassium: 3.9 mmol/L (ref 3.5–5.1)
Sodium: 137 mmol/L (ref 135–145)

## 2021-05-27 NOTE — ED Provider Notes (Signed)
[facility] EMERGENCY DEPARTMENT Provider Note   CSN: [id number] Arrival date & time: 05/27/21  1740     History Chief Complaint  Patient presents with   Foot Pain    Sarah Garrison is a 40 y.o. female with history of surgery for plantar fasciitis 3 weeks ago presenting to the ED with persistent right foot pain.  She was seen in the office 3 days ago by her podiatrist, noted to be doing well at the time, and recommended to be able to bear weight.  She says she still having significant difficulty walking on the right foot.  She has been walking with a right edge of her right foot because it hurts too much to walk flat on her foot.  She noted some swelling around her right anterior ankle, and is concerned by the degree of swelling around her ankle.  Still having persistent pain.  She has been using a scooter to keep off of her foot and for mobility.  She cannot use crutches because she says she has had multiple falls and cannot handle them.         Past Medical History:  Diagnosis Date   Anxiety    Asthma    Chronic constipation    Chronic shortness of breath    Depression    GERD (gastroesophageal reflux disease)    History of COVID-19 09/29/2019   09-28-2020 result in epic; hospital admission 10-05-2019 for covid pneumonia with hypoxic acute respiratory failure, no oxygen needed when discharged 10-06-2019;  and positive 06-13-2020 result in epic, pt stated no symptoms   History of seizures    (05-04-2021  pt stated was told during hospital admission 05/ 2021 with covid , she had 2 seizures,  stated no medication and no seizure since)   Hypertension    Insulin dependent type 2 diabetes mellitus (HCC)    endocrinologist-- Fredia Sorrow NP;   pt uses insulin pump   Insulin pump in place    Mixed hyperlipidemia    OSA (obstructive sleep apnea)    ( 05-04-2021 pt stated has not used cpap since 05/ 2021) followed by dr byrum;  study in epic 02-16-2019 moderate ,  titrated cpap 08-28-2019   Plantar fasciitis, right    w/ chronic pain    Patient Active Problem List   Diagnosis Date Noted   Hip pain 12/08/2019   Hyperlipidemia associated with type 2 diabetes mellitus (HCC) 11/23/2019   History of COVID-19 10/27/2019   Stuttering 10/27/2019   Depression with anxiety 10/27/2019   Obesity, Class III, BMI 40-49.9 (morbid obesity) (HCC) 06/30/2019   OSA (obstructive sleep apnea) 02/16/2019   Essential hypertension 06/22/2018   Uncontrolled type 2 diabetes mellitus with complication, with long-term current use of insulin 12/13/2016   Asthma 07/30/2016   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/07/2016   DM neuropathy, type II diabetes mellitus (HCC) 08/24/2015    Past Surgical History:  Procedure Laterality Date   CESAREAN SECTION  1999   PLANTAR FASCIA RELEASE Right 05/08/2021   Procedure: ENDOSCOPIC PLANTAR FASCIOTOMY;  Surgeon: Asencion Islam, DPM;  Location: [facility];  Service: Podiatry;  Laterality: Right;  WITH BLOCK   STERIOD INJECTION Bilateral 05/08/2021   Procedure: PLATELET RICH PLASMA INJECTION HEELS;  Surgeon: Asencion Islam, DPM;  Location: [facility];  Service: Podiatry;  Laterality: Bilateral;   TUBAL LIGATION Bilateral 09/29/2006   @[facility] ;   [facility]   VENTRAL HERNIA REPAIR N/A 11/27/2017   Procedure: VENTRAL HERNIA REPAIR ERAS  PATHWAY;  Surgeon: Harriette Bouillon, MD;  Location: [facility];  Service: General;  Laterality: N/A;   WISDOM TOOTH EXTRACTION       OB History     Gravida  5   Para  4   Term      Preterm      AB  1   Living  4      SAB  1   IAB      Ectopic      Multiple      Live Births              Family History  Problem Relation Age of Onset   Asthma Mother    Kidney failure Mother    Brain cancer Mother    Asthma Father    Other Father        surgery on stomach but don't know from what   Diabetes Brother    Diabetes Maternal Grandmother     Social  History   Tobacco Use   Smoking status: Former    Packs/day: 0.10    Years: 2.00    Pack years: 0.20    Types: Cigarettes    Quit date: 11/27/2017    Years since quitting: 3.4   Smokeless tobacco: Never  Vaping Use   Vaping Use: Some days   Devices: Elfvar  (05-04-2021 per pt last vaped approx 2 wks ago)  Substance Use Topics   Alcohol use: No   Drug use: Never    Home Medications Prior to Admission medications   Medication Sig Start Date End Date Taking? Authorizing Provider  albuterol (VENTOLIN HFA) 108 (90 Base) MCG/ACT inhaler Inhale 1-2 puffs into the lungs every 6 (six) hours as needed for wheezing or shortness of breath. Patient taking differently: Inhale 1-2 puffs into the lungs every 6 (six) hours as needed for wheezing or shortness of breath. 01/01/19   Storm Frisk, MD  atorvastatin (LIPITOR) 20 MG tablet Take 1 tablet (20 mg total) by mouth daily. 05/05/21     Continuous Blood Gluc Receiver (DEXCOM G6 RECEIVER) DEVI 1 each by Misc.(Non-Drug; Combo Route) route daily. 02/29/20   [provider]  Continuous Blood Gluc Sensor (DEXCOM G6 SENSOR) MISC Use to continuously monitor glucose.  Change every 10 days 02/16/21     Continuous Blood Gluc Transmit (DEXCOM G6 TRANSMITTER) MISC USE TO CHECK BLOOD SUGAR EVERY 3 MONTHS 10/16/19   [provider]  fluticasone-salmeterol (ADVAIR HFA) 115-21 MCG/ACT inhaler Inhale 2 puffs into the lungs 2 (two) times daily. Patient not taking: Reported on 05/04/2021 11/23/19   Storm Frisk, MD  ibuprofen (ADVIL) 800 MG tablet Take 1 tablet (800 mg total) by mouth every 8 (eight) hours as needed. 05/25/21   Asencion Islam, DPM  insulin aspart (NOVOLOG FLEXPEN) 100 UNIT/ML FlexPen Inject into the skin 3 (three) times daily before meals. ONLY WHEN INSULIN PUMP MALFUNCTION'S PER ENDOCRINOLOGY DIRECTION Patient not taking: Reported on 05/04/2021    [provider]  insulin aspart (NOVOLOG) 100 UNIT/ML injection USE VIA  INSULIN PUMP. MAX TOTAL DAILY DOSE OF 300 UNITS 02/17/21     insulin aspart (NOVOLOG) 100 UNIT/ML injection USE VIA INSULIN PUMP. MAX TOTAL DAILY DOSE OF 300 UNITS 02/24/21     insulin glargine, 1 Unit Dial, (TOUJEO SOLOSTAR) 300 UNIT/ML Solostar Pen Administer 50 units once daily only if insulin pump malfunction Patient not taking: Reported on 05/04/2021 02/24/21     Insulin Human (INSULIN PUMP) SOLN Inject  1 each into the skin See admin instructions. Medication: Novolog 100 units/ml injection. Takes 100 units via pump per patient.    [provider]  Insulin Syringe-Needle U-100 31G X 5/16" 1 ML MISC inject 10 units into the skin 3 times daily. use to inject novolog based on insulin to card ratio- max daily dose 150 units 09/23/20     linaclotide (LINZESS) 72 MCG capsule Take 1 capsule (72 mcg total) by mouth daily before breakfast. Patient taking differently: Take 72 mcg by mouth daily before breakfast. 04/26/21   Hoy Register, MD  lisinopril (ZESTRIL) 20 MG tablet Take 1 tablet (20 mg total) by mouth daily. Patient taking differently: Take 20 mg by mouth daily. 02/24/21     medroxyPROGESTERone Acetate (DEPO-PROVERA IM) Inject into the muscle every 3 (three) months.    [provider]  Misc. Devices MISC CPAP therapy on autopap 5-15.  Needs Small size [employer]kel Full Face Mask Simplus  mask and heated humidification. 09/13/19   Claiborne Rigg, NP  naproxen (EC NAPROSYN) 500 MG EC tablet Take 1 tablet (500 mg total) by mouth 2 (two) times daily with a meal. 05/08/21 08/06/21  Stover, Cassandria Anger, DPM  ondansetron (ZOFRAN ODT) 8 MG disintegrating tablet Take 1 tablet (8 mg total) by mouth every 8 (eight) hours as needed for nausea or vomiting. 02/21/21   Rhys Martini, PA-C  pantoprazole (PROTONIX) 40 MG tablet TAKE 1 TABLET (40 MG TOTAL) BY MOUTH DAILY. Patient taking differently: Take 40 mg by mouth daily. 01/06/21 01/06/22  Hoy Register, MD  pregabalin (LYRICA) 75 MG capsule Take 1  capsule (75 mg total) by mouth 2 (two) times daily. 05/18/21   Asencion Islam, DPM  promethazine (PHENERGAN) 12.5 MG tablet Take 1 tablet (12.5 mg total) by mouth every 6 (six) hours as needed for nausea or vomiting. 05/08/21   Stover, Cassandria Anger, DPM  Syringe/Needle, Disp, (SYRINGE 3CC/21GX1") 21G X 1" 3 ML MISC Use to fill pump cartridge with insulin as directed. 09/23/20   [provider]  traMADol (ULTRAM) 50 MG tablet Take 1 tablet (50 mg total) by mouth every 6 (six) hours as needed. 05/08/21 05/08/22  Stover, Cassandria Anger, DPM  TRUEPLUS PEN NEEDLES 32G X 4 MM MISC USE 3 TIMES DAILY AS DIRECTED 06/16/18   Hoy Register, MD  VITAMIN D PO Take 1 tablet by mouth 2 (two) times daily. Patient not taking: Reported on 05/04/2021    [provider]  ARIPiprazole (ABILIFY) 5 MG tablet Take 1 tablet (5 mg total) by mouth daily. 10/27/19 06/13/20  Sater, Pearletha Furl, MD    Allergies    Morphine and related, Glyburide, Hydrocodone, Ivp dye [iodinated contrast media], and Oxycodone  Review of Systems   Review of Systems  Constitutional:  Negative for chills and fever.  Respiratory:  Negative for cough and shortness of breath.   Musculoskeletal:  Positive for arthralgias. Negative for back pain.  Skin:  Positive for rash. Negative for color change.  Neurological:  Positive for numbness. Negative for weakness.   Physical Exam Updated Vital Signs BP (!) 168/130 (BP Location: Right Arm)    Pulse (!) 109    Temp 98.7 F (37.1 C) (Oral)    Resp 18    SpO2 98%   Physical Exam Constitutional:      General: She is not in acute distress. HENT:     Head: Normocephalic and atraumatic.  Eyes:     Conjunctiva/sclera: Conjunctivae normal.     Pupils: Pupils are equal,  round, and reactive to light.  Cardiovascular:     Rate and Rhythm: Normal rate and regular rhythm.     Pulses: Normal pulses.  Musculoskeletal:     Comments: Mild swelling along anterior talofibular ligament  Skin:    General:  Skin is warm and dry.     Comments: Suture site near right heel clean, intact, nonerhythematous  Neurological:     General: No focal deficit present.     Mental Status: She is alert and oriented to person, place, and time. Mental status is at baseline.  Psychiatric:        Mood and Affect: Mood normal.        Behavior: Behavior normal.    ED Results / Procedures / Treatments   Labs (all labs ordered are listed, but only abnormal results are displayed) Labs Reviewed  BASIC METABOLIC PANEL - Abnormal; Notable for the following components:      Result Value   CO2 21 (*)    Glucose, Bld 259 (*)    Calcium 8.8 (*)    All other components within normal limits  CBC WITH DIFFERENTIAL/PLATELET    EKG None  Radiology DG Foot Complete Right  Result Date: 05/27/2021 CLINICAL DATA:  pain , post-op swelling EXAM: RIGHT FOOT COMPLETE - 3+ VIEW COMPARISON:  March 16, 2021 FINDINGS: No acute fracture or dislocation. Joint spaces and alignment are maintained. No area of erosion or osseous destruction. No unexpected radiopaque foreign body. Mild soft tissue edema. IMPRESSION: No acute fracture or dislocation. Electronically Signed   By: Meda Klinefelter M.D.   On: 05/27/2021 18:29    Procedures Procedures   Medications Ordered in ED Medications - No data to display  ED Course  I have reviewed the triage vital signs and the nursing notes.  Pertinent labs & imaging results that were available during my care of the patient were reviewed by me and considered in my medical decision making (see chart for details).  Patient is here some edema around the right ankle now that she is bearing weight after her surgery.  I do not see any evidence of infection.  I personally reviewed her chest x-rays which did not show signs of osteomyelitis or occult fracture.  I also reviewed her blood test, normal white blood cell count.  She is afebrile.  I doubt this is sepsis or osteomyelitis or necrotizing  fasciitis.  I explained this is likely some edema and that she is bearing weight on her foot, we can try postop shoe, she is walking of the edge of her foot and this may be causing additional stress on her foot and lower leg.  If she still cannot bear normal weight, I would recommend she continue using the scooter to keep weight off of her leg, and follow-up with her surgeon.  She verbalized understanding and agreement with the plan     Final Clinical Impression(s) / ED Diagnoses Final diagnoses:  Right ankle pain, unspecified chronicity  Right foot pain    Rx / DC Orders ED Discharge Orders     None        Mann Skaggs, Kermit Balo, MD 05/27/21 [phone]

## 2021-05-27 NOTE — ED Provider Notes (Signed)
Emergency Medicine Provider Triage Evaluation Note  Sarah Garrison , a 41 y.o. female  was evaluated in triage.  Pt complains of right foot pain.  Patient states that on 05/08/2021 she had right plantar fasciotomy at [facility].  She states that her postoperative course was going appropriately until 2 days ago when she began having pain and swelling in the foot.  She denies redness to the foot or swelling of the ankle or calf.  She does endorse fever of 100 yesterday.  No chest pain, shortness of breath, nausea..  Review of Systems  Positive: See above Negative:   Physical Exam  BP (!) 168/130 (BP Location: Right Arm)    Pulse (!) 122    Temp 98.7 F (37.1 C) (Oral)    Resp (!) 22    SpO2 98%  Gen:   Awake, no distress   Resp:  Normal effort  MSK:   Moves extremities without difficulty  Other:  Right foot with postsurgical scar to the medial right calcaneus.  It appears well healing.  There is swelling noted to the plantar and dorsal surface of the foot.  There is tenderness to palpation.  Pulses intact.  Sensation intact  Medical Decision Making  Medically screening exam initiated at 5:52 PM.  Appropriate orders placed.  Sarah Garrison was informed that the remainder of the evaluation will be completed by another provider, this initial triage assessment does not replace that evaluation, and the importance of remaining in the ED until their evaluation is complete.  Obtaining labs given fever   Lenard Galloway 05/27/21 1755    Terald Sleeper, MD 05/27/21 1911

## 2021-05-27 NOTE — Discharge Instructions (Addendum)
Please call and follow-up with the orthopedic/foot surgeon in the office for this issue.  I do not see any signs of an infection on your exam today.  We do not see signs of fractures of the bones.  You may be having some swelling or strain on your muscles or ligaments because of the way you are walking and shifting weight on your foot.  If you are still not able to walk normally, I would recommend keeping off of this foot until you see your surgeon again, rather than raising your risk of rolling your ankle or injuring your foot again.

## 2021-05-27 NOTE — ED Triage Notes (Signed)
Pt reports having staples removed 2 days ago from her right foot. Pt reports having fever and right foot swelling.

## 2021-05-27 NOTE — ED Notes (Signed)
Pt verbalized understanding of d/c instructions, meds, and followup care. Denies questions. VSS, no distress noted. Post op boot applied. Pt leaves on knee board to lobby with son.

## 2021-06-02 ENCOUNTER — Other Ambulatory Visit: Payer: Self-pay

## 2021-06-02 ENCOUNTER — Ambulatory Visit: Payer: Medicaid Other | Attending: Nurse Practitioner

## 2021-06-02 DIAGNOSIS — Z3042 Encounter for surveillance of injectable contraceptive: Secondary | ICD-10-CM | POA: Diagnosis not present

## 2021-06-02 MED ORDER — MEDROXYPROGESTERONE ACETATE 150 MG/ML IM SUSP
150.0000 mg | Freq: Once | INTRAMUSCULAR | Status: AC
Start: 2021-06-02 — End: 2021-06-02
  Administered 2021-06-02: 10:00:00 150 mg via INTRAMUSCULAR

## 2021-06-02 NOTE — Progress Notes (Signed)
Pt was given depo injection in left ventragluteal. Pt tolerated shot well No urine HCG needed. Pt to return MAR 17-MAR-31

## 2021-06-08 ENCOUNTER — Encounter: Payer: Medicaid Other | Admitting: Sports Medicine

## 2021-06-08 ENCOUNTER — Other Ambulatory Visit: Payer: Self-pay

## 2021-06-08 ENCOUNTER — Ambulatory Visit (INDEPENDENT_AMBULATORY_CARE_PROVIDER_SITE_OTHER): Payer: Medicaid Other | Admitting: Sports Medicine

## 2021-06-08 DIAGNOSIS — Z9889 Other specified postprocedural states: Secondary | ICD-10-CM

## 2021-06-08 DIAGNOSIS — Z794 Long term (current) use of insulin: Secondary | ICD-10-CM

## 2021-06-08 DIAGNOSIS — M722 Plantar fascial fibromatosis: Secondary | ICD-10-CM

## 2021-06-08 DIAGNOSIS — E1140 Type 2 diabetes mellitus with diabetic neuropathy, unspecified: Secondary | ICD-10-CM

## 2021-06-08 DIAGNOSIS — M79671 Pain in right foot: Secondary | ICD-10-CM

## 2021-06-08 MED ORDER — IBUPROFEN 800 MG PO TABS: 800.0000 mg | ORAL_TABLET | Freq: Three times a day (TID) | ORAL | 0 refills | Status: DC | PRN

## 2021-06-08 NOTE — Progress Notes (Signed)
Subjective: Sarah Garrison is a 41 y.o. female patient seen today in office for POV # 3 (DOS 05-08-21), S/P Right EPF and bil heel PRP injection. Patient admits some pain to right heel and has noticed the skin in cracking open but there is no pain no drainage states that she has been keeping it clean but she does have some soreness and throbbing pain. No other issues noted.   Patient Active Problem List   Diagnosis Date Noted   Hip pain 12/08/2019   Hyperlipidemia associated with type 2 diabetes mellitus (HCC) 11/23/2019   History of COVID-19 10/27/2019   Stuttering 10/27/2019   Depression with anxiety 10/27/2019   Obesity, Class III, BMI 40-49.9 (morbid obesity) (HCC) 06/30/2019   OSA (obstructive sleep apnea) 02/16/2019   Essential hypertension 06/22/2018   Uncontrolled type 2 diabetes mellitus with complication, with long-term current use of insulin 12/13/2016   Asthma 07/30/2016   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/07/2016   DM neuropathy, type II diabetes mellitus (HCC) 08/24/2015    Current Outpatient Medications on File Prior to Visit  Medication Sig Dispense Refill   albuterol (VENTOLIN HFA) 108 (90 Base) MCG/ACT inhaler Inhale 1-2 puffs into the lungs every 6 (six) hours as needed for wheezing or shortness of breath. (Patient taking differently: Inhale 1-2 puffs into the lungs every 6 (six) hours as needed for wheezing or shortness of breath.) 18 g 1   atorvastatin (LIPITOR) 20 MG tablet Take 1 tablet (20 mg total) by mouth daily. 90 tablet 1   Continuous Blood Gluc Receiver (DEXCOM G6 RECEIVER) DEVI 1 each by Misc.(Non-Drug; Combo Route) route daily.     Continuous Blood Gluc Sensor (DEXCOM G6 SENSOR) MISC Use to continuously monitor glucose.  Change every 10 days 3 each 0   Continuous Blood Gluc Transmit (DEXCOM G6 TRANSMITTER) MISC USE TO CHECK BLOOD SUGAR EVERY 3 MONTHS     fluticasone-salmeterol (ADVAIR HFA) 115-21 MCG/ACT inhaler Inhale 2 puffs into the lungs 2 (two) times daily.  (Patient not taking: Reported on 05/04/2021) 1 Inhaler 12   insulin aspart (NOVOLOG FLEXPEN) 100 UNIT/ML FlexPen Inject into the skin 3 (three) times daily before meals. ONLY WHEN INSULIN PUMP MALFUNCTION'S PER ENDOCRINOLOGY DIRECTION (Patient not taking: Reported on 05/04/2021)     insulin aspart (NOVOLOG) 100 UNIT/ML injection USE VIA INSULIN PUMP. MAX TOTAL DAILY DOSE OF 300 UNITS 90 mL 5   insulin aspart (NOVOLOG) 100 UNIT/ML injection USE VIA INSULIN PUMP. MAX TOTAL DAILY DOSE OF 300 UNITS 90 mL 5   insulin glargine, 1 Unit Dial, (TOUJEO SOLOSTAR) 300 UNIT/ML Solostar Pen Administer 50 units once daily only if insulin pump malfunction (Patient not taking: Reported on 05/04/2021) 9 mL 1   Insulin Human (INSULIN PUMP) SOLN Inject 1 each into the skin See admin instructions. Medication: Novolog 100 units/ml injection. Takes 100 units via pump per patient.     Insulin Syringe-Needle U-100 31G X 5/16" 1 ML MISC inject 10 units into the skin 3 times daily. use to inject novolog based on insulin to card ratio- max daily dose 150 units 100 each 2   linaclotide (LINZESS) 72 MCG capsule Take 1 capsule (72 mcg total) by mouth daily before breakfast. (Patient taking differently: Take 72 mcg by mouth daily before breakfast.) 30 capsule 3   lisinopril (ZESTRIL) 20 MG tablet Take 1 tablet (20 mg total) by mouth daily. (Patient taking differently: Take 20 mg by mouth daily.) 90 tablet 2   medroxyPROGESTERone Acetate (DEPO-PROVERA IM) Inject into the muscle every  3 (three) months.     Misc. Devices MISC CPAP therapy on autopap 5-15.  Needs Small size [employer] Full Face Mask Simplus  mask and heated humidification. 1 each 0   naproxen (EC NAPROSYN) 500 MG EC tablet Take 1 tablet (500 mg total) by mouth 2 (two) times daily with a meal. 60 tablet 2   ondansetron (ZOFRAN ODT) 8 MG disintegrating tablet Take 1 tablet (8 mg total) by mouth every 8 (eight) hours as needed for nausea or vomiting. 20 tablet 0    pantoprazole (PROTONIX) 40 MG tablet TAKE 1 TABLET (40 MG TOTAL) BY MOUTH DAILY. (Patient taking differently: Take 40 mg by mouth daily.) 30 tablet 3   pregabalin (LYRICA) 75 MG capsule Take 1 capsule (75 mg total) by mouth 2 (two) times daily. 30 capsule 1   promethazine (PHENERGAN) 12.5 MG tablet Take 1 tablet (12.5 mg total) by mouth every 6 (six) hours as needed for nausea or vomiting. 30 tablet 0   Syringe/Needle, Disp, (SYRINGE 3CC/21GX1") 21G X 1" 3 ML MISC Use to fill pump cartridge with insulin as directed.     traMADol (ULTRAM) 50 MG tablet Take 1 tablet (50 mg total) by mouth every 6 (six) hours as needed. 20 tablet 0   TRUEPLUS PEN NEEDLES 32G X 4 MM MISC USE 3 TIMES DAILY AS DIRECTED 100 each 5   VITAMIN D PO Take 1 tablet by mouth 2 (two) times daily. (Patient not taking: Reported on 05/04/2021)     [DISCONTINUED] ARIPiprazole (ABILIFY) 5 MG tablet Take 1 tablet (5 mg total) by mouth daily. 30 tablet 2   No current facility-administered medications on file prior to visit.    Allergies  Allergen Reactions   Morphine And Related Shortness Of Breath   Glyburide Diarrhea and Other (See Comments)    Reaction:  Nose bleeds    Hydrocodone Other (See Comments)    Pt states that this medication makes her "dream about rabbits chasing her"   Ivp Dye [Iodinated Contrast Media] Other (See Comments)    Shortness of breath.     Oxycodone Other (See Comments)    Pt states that this medication makes her "dream about rabbits chasing" her.      Objective: There were no vitals filed for this visit.  General: No acute distress, AAOx3  Right foot: Surgical site well-healed with mild dry skin at incision areas, mild swelling to the heel, no warmth, no drainage, no signs of infection noted, Capillary fill time <3 seconds in all digits, gross sensation present via light touch to right foot. Guarding due to pain on the right heel. No pain with calf compression.   Assessment and Plan:  Problem List  Items Addressed This Visit       Endocrine   DM neuropathy, type II diabetes mellitus (HCC)   Other Visit Diagnoses     S/P foot surgery    -  Primary   Relevant Orders   Ambulatory referral to Physical Therapy   Intractable right heel pain       Plantar fasciitis, right       Plantar fasciitis, left           -Patient seen and evaluated -Advised patient at the surgical site it is dry skin and cracking nothing has dehisced or reopened -May weight-bear to tolerance -Refilled Motrin -Recommend icing for edema control as well as rest and elevation -May continue with good supportive shoes -Rx physical therapy for edema reduction at surgical site  and gait training -Will plan for following up on how patient is doing after she has started physical therapy at next visit. Asencion Islam, DPM

## 2021-06-08 NOTE — Patient Instructions (Signed)
Medically cleared for pedicure no scrubbing of the right heel and no massage of the right foot

## 2021-06-13 NOTE — Therapy (Signed)
OUTPATIENT PHYSICAL THERAPY LOWER EXTREMITY EVALUATION   Patient Name: Sarah Garrison MRN: [id number] DOB:04-04-1981, 41 y.o., female Today's Date: 06/14/2021   PT End of Session - 06/14/21 1848     Visit Number 1    Number of Visits 17    Date for PT Re-Evaluation 08/09/21    Authorization Type [place] [employer]    Authorization Time Period Pending auth    PT Start Time 1748    PT Stop Time 1830    PT Time Calculation (min) 42 min    Activity Tolerance Patient tolerated treatment well    Behavior During Therapy WFL for tasks assessed/performed             Past Medical History:  Diagnosis Date   Anxiety    Asthma    Chronic constipation    Chronic shortness of breath    Depression    GERD (gastroesophageal reflux disease)    History of COVID-19 09/29/2019   09-28-2020 result in epic; hospital admission 10-05-2019 for covid pneumonia with hypoxic acute respiratory failure, no oxygen needed when discharged 10-06-2019;  and positive 06-13-2020 result in epic, pt stated no symptoms   History of seizures    (05-04-2021  pt stated was told during hospital admission 05/ 2021 with covid , she had 2 seizures,  stated no medication and no seizure since)   Hypertension    Insulin dependent type 2 diabetes mellitus (HCC)    endocrinologist-- Fredia Sorrow NP;   pt uses insulin pump   Insulin pump in place    Mixed hyperlipidemia    OSA (obstructive sleep apnea)    ( 05-04-2021 pt stated has not used cpap since 05/ 2021) followed by dr byrum;  study in epic 02-16-2019 moderate , titrated cpap 08-28-2019   Plantar fasciitis, right    w/ chronic pain   Past Surgical History:  Procedure Laterality Date   CESAREAN SECTION  1999   PLANTAR FASCIA RELEASE Right 05/08/2021   Procedure: ENDOSCOPIC PLANTAR FASCIOTOMY;  Surgeon: Asencion Islam, DPM;  Location: [facility];  Service: Podiatry;  Laterality: Right;  WITH BLOCK   STERIOD INJECTION Bilateral 05/08/2021    Procedure: PLATELET RICH PLASMA INJECTION HEELS;  Surgeon: Asencion Islam, DPM;  Location: [facility];  Service: Podiatry;  Laterality: Bilateral;   TUBAL LIGATION Bilateral 09/29/2006   @[facility] ;   [facility]   VENTRAL HERNIA REPAIR N/A 11/27/2017   Procedure: VENTRAL HERNIA REPAIR ERAS PATHWAY;  Surgeon: 11/29/2017, MD;  Location: [facility];  Service: General;  Laterality: N/A;   WISDOM TOOTH EXTRACTION     Patient Active Problem List   Diagnosis Date Noted   Hip pain 12/08/2019   Hyperlipidemia associated with type 2 diabetes mellitus (HCC) 11/23/2019   History of COVID-19 10/27/2019   Stuttering 10/27/2019   Depression with anxiety 10/27/2019   Obesity, Class III, BMI 40-49.9 (morbid obesity) (HCC) 06/30/2019   OSA (obstructive sleep apnea) 02/16/2019   Essential hypertension 06/22/2018   Uncontrolled type 2 diabetes mellitus with complication, with long-term current use of insulin 12/13/2016   Asthma 07/30/2016   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/07/2016   DM neuropathy, type II diabetes mellitus (HCC) 08/24/2015    PCP: 08/26/2015, FNP  REFERRING PROVIDER: Maggie Font, FNP  REFERRING DIAG: [phone] (ICD-10-CM) - S/P foot surgery  THERAPY DIAG:  Pain in right foot  Muscle weakness (generalized)  Difficulty in walking, not elsewhere classified  ONSET DATE: 05/08/2021  SUBJECTIVE:  SUBJECTIVE STATEMENT: Pt reports 5 weeks and 2 days s/p Rt endoscopic plantar fasciotomy on 05/08/2021 with Dr. Marylene LandStover. She reports that prior to this surgery she had severe pain associated with plantar fasciitis lasting 2-3 years. She reports that she initially had a CAM boot but was allowed to discharge the boot about 1 week ago at her last orthopedic appointment. She reports that she has no current precautions/restrictions related to her surgery. The pt adds that she has difficulty walking with her Rt foot flat, but rather has to walk on the ball of her Rt foot with  ambulation unless she is wearing shoes with good cushioning. She reports that she uses her knee scooter for household ambulation if she is not wearing shoes. Pt reports increased pain after 5-10 minutes of standing, requiring a seated rest. Current pain is 7/10, worst pain is 10/10, best pain is 7/10. She reports that ibuprofen only helps a little bit to relieve the pain, as well as seated rest. She adds that the skin over her surgical incision has sloughed off and the area is now pink and "raw." PERTINENT HISTORY: Right ENDOSCOPIC PLANTAR FASCIOTOMY 05/08/2021  PAIN:  Are you having pain? Yes VAS scale: 7/10 Pain location: Foot Pain orientation: Right  PAIN TYPE: burning Pain description: constant  Aggravating factors: Prolonged standing >5-10 minutes Relieving factors: Seated rest, ibuprofen  PRECAUTIONS: None  WEIGHT BEARING RESTRICTIONS No  FALLS:  Has patient fallen in last 6 months? Yes, Number of falls: 2, the first few weeks after surgery due to difficulty with crutches  LIVING ENVIRONMENT: Lives with: lives with their family Lives in: House/apartment Stairs: Yes; External: 8 steps; on right going up, on left going up, and can reach both Has following equipment at home: Crutches and knee scooter  OCCUPATION: Unemployed  PLOF: Independent  PATIENT GOALS Pt would like to return to working with prolonged standing   OBJECTIVE:   DIAGNOSTIC FINDINGS: 05/27/2021 DG Foot Complete Right: IMPRESSION: No acute fracture or dislocation.   COGNITION:  Overall cognitive status: Within functional limits for tasks assessed     SENSATION:  Light touch: Appears intact    MUSCLE LENGTH: Gastrocnemius: Severely limited on Lt, not tested on Rt due to heightened pain Soleus: Severely limited on Lt, not tested on Rt due to heightened pain  POSTURE:  WNL  PALPATION: Exquisite TTP to plantar heel and medial arch on Rt  LE AROM/PROM:  AROM Right 06/14/2021 Left 06/14/2021   Ankle dorsiflexion -25 0  Ankle plantarflexion 38 45  Ankle inversion 8 35  Ankle eversion 8 10   (Blank rows = not tested)  LE MMT:  MMT Right 06/14/2021 Left 06/14/2021  Ankle dorsiflexion 2+/5p! 5/5  Ankle plantarflexion 2+/5p! 5/5  Ankle inversion 2+/5p! 5/5  Ankle eversion 2+/5p! 5/5   (Blank rows = not tested)   FUNCTIONAL TESTS:  Deferred due to high pain state and difficulty with standing  GAIT: Distance walked: 4320ft Assistive device utilized: None Level of assistance: Complete Independence Comments: Pt walked with antalgic gait, weight bearing through her Rt lateral metatarsals (no heel strike)    TODAY'S TREATMENT: [facility] Adult PT Treatment:                                                DATE: 06/14/2021 Therapeutic Exercise: Seated calf stretch with towel x1 min on Rt Seated  heel/toe rocking 2x10 BIL Seated ankle eversion/inversion AROM with heel on ground x10 each on Rt Manual Therapy: N/A Neuromuscular re-ed: N/A Therapeutic Activity: N/A Modalities: N/A Self Care: N/A    PATIENT EDUCATION:  Education details: Pt educated about prognosis, POC, and HEP Person educated: Patient Education method: Programmer, multimedia, Facilities manager, and Handouts Education comprehension: verbalized understanding and returned demonstration   HOME EXERCISE PROGRAM: Access Code: [email or web address]  Exercises Seated Calf Stretch with Strap - 1 x daily - 7 x weekly - 2 sets - 1-min hold Seated Toe Raise - 1 x daily - 7 x weekly - 3 sets - 10 reps Seated Heel Raise - 1 x daily - 7 x weekly - 3 sets - 10 reps Seated Ankle Inversion Eversion AROM - 1 x daily - 7 x weekly - 3 sets - 10 reps Seated Ankle Inversion AROM - 1 x daily - 7 x weekly - 3 sets - 10 reps   ASSESSMENT:  CLINICAL IMPRESSION: Patient is a 41 y.o. F who was seen today for physical therapy evaluation and treatment 5 weeks and 2 days s/p Rt endoscopic plantar fasciotomy on 05/08/2021 with Dr. Marylene Land. Upon assessment,  the pt's primary impairments include global Rt ankle pain and weakness with strength testing, painful and limited global Rt ankle AROM, TTP on Rt plantar heel and medial arch, and painful, lateral toe walking on Rt. Patient will benefit from skilled PT to address above impairments and improve overall function.  REHAB POTENTIAL: Good  CLINICAL DECISION MAKING: Stable/uncomplicated  EVALUATION COMPLEXITY: Low   GOALS: Goals reviewed with patient? Yes  SHORT TERM GOALS:  STG Name Target Date Goal status  1 Pt will report understanding and adherence to her HEP in order to promote independence in the management of her primary impairments. Baseline: HEP provided at eval 07/12/2021 INITIAL   LONG TERM GOALS:   LTG Name Target Date Goal status  1 Pt will achieve global Rt ankle strength of 4/5 or higher in order to independently progress her LE strengthening regimen without limitation. Baseline: globally 2+/5 08/09/2021 INITIAL  2 Pt will achieve BIL ankle DF AROM of 5 degrees in order to promote WNL gait pattern. Baseline: See AROM chart 08/09/2021 INITIAL  3 Pt will report ability to walk >20 minutes without the need of a seated rest break with 0-2/10 pain in order to grocery shop with less limitation. Baseline: Pt unable to stand/walk >5-10 minutes due to severe pain 08/09/2021 INITIAL  4 Pt will perform 5 full-weight-bearing squats with 0-2/10 pain in order to lift groceries from the floor without limitation. Baseline: Unable to perform a squat due to pain. 08/09/2021 INITIAL   PLAN: PT FREQUENCY: 2x/week  PT DURATION: 8 weeks  PLANNED INTERVENTIONS: Therapeutic exercises, Therapeutic activity, Neuro Muscular re-education, Balance training, Gait training, Patient/Family education, Joint mobilization, Stair training, Dry Needling, Spinal mobilization, Cryotherapy, Moist heat, Taping, and Manual therapy  PLAN FOR NEXT SESSION: Progress open-chain ankle strengthening/ROM; progress to closed-chain  strengthening/ gait training as able   Carmelina Dane, PT, DPT 06/14/21 7:10 PM  Check all possible CPT codes: 75102- Therapeutic Exercise, [phone]- Neuro Re-education, [phone] - Gait Training, [phone] - Manual Therapy, 97530 - Therapeutic Activities, 97535 - Self Care, and [phone] - Aquatic therapy

## 2021-06-14 ENCOUNTER — Other Ambulatory Visit: Payer: Self-pay

## 2021-06-14 ENCOUNTER — Ambulatory Visit: Payer: Medicaid Other | Attending: Nurse Practitioner

## 2021-06-14 DIAGNOSIS — M79671 Pain in right foot: Secondary | ICD-10-CM | POA: Diagnosis present

## 2021-06-14 DIAGNOSIS — R262 Difficulty in walking, not elsewhere classified: Secondary | ICD-10-CM | POA: Diagnosis present

## 2021-06-14 DIAGNOSIS — M6281 Muscle weakness (generalized): Secondary | ICD-10-CM | POA: Diagnosis present

## 2021-06-19 NOTE — Telephone Encounter (Signed)
Error message

## 2021-06-21 NOTE — Therapy (Signed)
OUTPATIENT PHYSICAL THERAPY TREATMENT NOTE   Patient Name: Sarah Garrison MRN: [id number] DOB:03-May-1981, 41 y.o., female Today's Date: 06/22/2021  PCP: Maggie Font, FNP REFERRING PROVIDER: Asencion Islam, DPM   PT End of Session - 06/22/21 0915     Visit Number 2    Number of Visits 17    Date for PT Re-Evaluation 08/09/21    Authorization Type [place] [employer]    Authorization Time Period Pending auth    PT Start Time 0915    PT Stop Time 1005   10 minutes vasopneumatic   PT Time Calculation (min) 50 min    Equipment Utilized During Treatment Gait belt    Activity Tolerance Patient tolerated treatment well;Patient limited by pain    Behavior During Therapy WFL for tasks assessed/performed             Past Medical History:  Diagnosis Date   Anxiety    Asthma    Chronic constipation    Chronic shortness of breath    Depression    GERD (gastroesophageal reflux disease)    History of COVID-19 09/29/2019   09-28-2020 result in epic; hospital admission 10-05-2019 for covid pneumonia with hypoxic acute respiratory failure, no oxygen needed when discharged 10-06-2019;  and positive 06-13-2020 result in epic, pt stated no symptoms   History of seizures    (05-04-2021  pt stated was told during hospital admission 05/ 2021 with covid , she had 2 seizures,  stated no medication and no seizure since)   Hypertension    Insulin dependent type 2 diabetes mellitus [facility])    endocrinologist-- Fredia Sorrow NP;   pt uses insulin pump   Insulin pump in place    Mixed hyperlipidemia    OSA (obstructive sleep apnea)    ( 05-04-2021 pt stated has not used cpap since 05/ 2021) followed by dr byrum;  study in epic 02-16-2019 moderate , titrated cpap 08-28-2019   Plantar fasciitis, right    w/ chronic pain   Past Surgical History:  Procedure Laterality Date   CESAREAN SECTION  1999   PLANTAR FASCIA RELEASE Right 05/08/2021   Procedure: ENDOSCOPIC PLANTAR FASCIOTOMY;   Surgeon: Asencion Islam, DPM;  Location: [facility];  Service: Podiatry;  Laterality: Right;  WITH BLOCK   STERIOD INJECTION Bilateral 05/08/2021   Procedure: PLATELET RICH PLASMA INJECTION HEELS;  Surgeon: Asencion Islam, DPM;  Location: [facility];  Service: Podiatry;  Laterality: Bilateral;   TUBAL LIGATION Bilateral 09/29/2006   @[facility] ;   [facility]   VENTRAL HERNIA REPAIR N/A 11/27/2017   Procedure: VENTRAL HERNIA REPAIR ERAS PATHWAY;  Surgeon: 11/29/2017, MD;  Location:  SURGERY CENTER;  Service: General;  Laterality: N/A;   WISDOM TOOTH EXTRACTION     Patient Active Problem List   Diagnosis Date Noted   Hip pain 12/08/2019   Hyperlipidemia associated with type 2 diabetes mellitus (HCC) 11/23/2019   History of COVID-19 10/27/2019   Stuttering 10/27/2019   Depression with anxiety 10/27/2019   Obesity, Class III, BMI 40-49.9 (morbid obesity) (HCC) 06/30/2019   OSA (obstructive sleep apnea) 02/16/2019   Essential hypertension 06/22/2018   Uncontrolled type 2 diabetes mellitus with complication, with long-term current use of insulin 12/13/2016   Asthma 07/30/2016   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/07/2016   DM neuropathy, type II diabetes mellitus (HCC) 08/24/2015    REFERRING DIAG: 08/26/2015 (ICD-10-CM) - S/P foot surgery  THERAPY DIAG:  Pain in right foot  Muscle weakness (generalized)  Difficulty in walking, not elsewhere classified  PERTINENT HISTORY: Right ENDOSCOPIC PLANTAR FASCIOTOMY 05/08/2021  SUBJECTIVE: Pt reports continued mid foot pain, along with new onset of 3rd digit pain on the Rt. She reports she has been doing her HEP daily and cannot pinpoint which particular movements are increasing her pain. She also reports continued swelling on her Rt foot and pain with weight-bearing.  PAIN:  Are you having pain? Yes NPRS scale: 7/10 Pain location: Medial Rt arch, Rt calf PAIN TYPE: aching and tight Pain description: intermittent     OBJECTIVE:   *Unless otherwise noted, objective information collected previously* DIAGNOSTIC FINDINGS: 05/27/2021 DG Foot Complete Right: IMPRESSION: No acute fracture or dislocation.     COGNITION:          Overall cognitive status: Within functional limits for tasks assessed                        SENSATION:          Light touch: Appears intact             MUSCLE LENGTH: Gastrocnemius: Severely limited on Lt, not tested on Rt due to heightened pain Soleus: Severely limited on Lt, not tested on Rt due to heightened pain   POSTURE:  WNL   PALPATION: Exquisite TTP to plantar heel and medial arch on Rt   LE AROM/PROM:   AROM Right 06/14/2021 Left 06/14/2021  Ankle dorsiflexion -25 0  Ankle plantarflexion 38 45  Ankle inversion 8 35  Ankle eversion 8 10   (Blank rows = not tested)   LE MMT:   MMT Right 06/14/2021 Left 06/14/2021  Ankle dorsiflexion 2+/5p! 5/5  Ankle plantarflexion 2+/5p! 5/5  Ankle inversion 2+/5p! 5/5  Ankle eversion 2+/5p! 5/5   (Blank rows = not tested)     FUNCTIONAL TESTS:  Deferred due to high pain state and difficulty with standing   GAIT: Distance walked: 10ft Assistive device utilized: None Level of assistance: Complete Independence Comments: Pt walked with antalgic gait, weight bearing through her Rt lateral metatarsals (no heel strike)       TODAY'S TREATMENT:  [facility] Adult PT Treatment:                                                DATE: 06/22/2021 Therapeutic Exercise: Long-sitting gastrocnemius stretch x2 minutes on Rt Long-sitting soleus stretch x2 minutes on Rt Side-to-side weight shifting on Airex pad in // bars, 2x20 Front-to-back weight shifting on Airex pad in // bars 2x20 Mini-squat on Airex pad in // bars 2x8 Tandem stance on Airex pad 2x30sec BIL  Manual Therapy: N/A Neuromuscular re-ed: N/A Therapeutic Activity: N/A Modalities: Game Ready Vasopneumatic device on Rt ankle, 34 degrees, supine with foot  elevated x10 minutes Self Care: N/A   [facility] Adult PT Treatment:                                                DATE: 06/14/2021 Therapeutic Exercise: Seated calf stretch with towel x1 min on Rt Seated heel/toe rocking 2x10 BIL Seated ankle eversion/inversion AROM with heel on ground x10 each on Rt Manual Therapy: N/A Neuromuscular re-ed: N/A Therapeutic Activity: N/A Modalities: N/A Self Care: N/A  PATIENT EDUCATION:  Education details: Pt educated about prognosis, POC, and HEP Person educated: Patient Education method: Programmer, multimediaxplanation, Facilities managerDemonstration, and Handouts Education comprehension: verbalized understanding and returned demonstration     HOME EXERCISE PROGRAM: Access Code: [email or web address]   Exercises Seated Calf Stretch with Strap - 1 x daily - 7 x weekly - 2 sets - 1-min hold Seated Toe Raise - 1 x daily - 7 x weekly - 3 sets - 10 reps Seated Heel Raise - 1 x daily - 7 x weekly - 3 sets - 10 reps Seated Ankle Inversion Eversion AROM - 1 x daily - 7 x weekly - 3 sets - 10 reps Seated Ankle Inversion AROM - 1 x daily - 7 x weekly - 3 sets - 10 reps     ASSESSMENT:   CLINICAL IMPRESSION: Pt had difficulty throughout the session due to foot pain, but was able to tolerate selected exercises. She continues to be very limited in Rt ankle DF AROM, as well as any physical touch to her foot due to pain. She responded well to vasopneumatic treatment after reporting pain in the first 2 minutes due to the compression. She reports decrease in pain from 7/10 to 4/10 following this treatment. Primary goals moving forward include desensitization, graded exposure to weight-bearing, and generalized ROM/ strengthening. She will continue to benefit from skilled PT to address her primary impairments and return to her prior level of function with less limitation.   REHAB POTENTIAL: Good   CLINICAL DECISION MAKING: Stable/uncomplicated   EVALUATION COMPLEXITY: Low     GOALS: Goals  reviewed with patient? Yes   SHORT TERM GOALS:   STG Name Target Date Goal status  1 Pt will report understanding and adherence to her HEP in order to promote independence in the management of her primary impairments. Baseline: HEP provided at eval 07/12/2021 INITIAL    LONG TERM GOALS:    LTG Name Target Date Goal status  1 Pt will achieve global Rt ankle strength of 4/5 or higher in order to independently progress her LE strengthening regimen without limitation. Baseline: globally 2+/5 08/09/2021 INITIAL  2 Pt will achieve BIL ankle DF AROM of 5 degrees in order to promote WNL gait pattern. Baseline: See AROM chart 08/09/2021 INITIAL  3 Pt will report ability to walk >20 minutes without the need of a seated rest break with 0-2/10 pain in order to grocery shop with less limitation. Baseline: Pt unable to stand/walk >5-10 minutes due to severe pain 08/09/2021 INITIAL  4 Pt will perform 5 full-weight-bearing squats with 0-2/10 pain in order to lift groceries from the floor without limitation. Baseline: Unable to perform a squat due to pain. 08/09/2021 INITIAL    PLAN: PT FREQUENCY: 2x/week   PT DURATION: 8 weeks   PLANNED INTERVENTIONS: Therapeutic exercises, Therapeutic activity, Neuro Muscular re-education, Balance training, Gait training, Patient/Family education, Joint mobilization, Stair training, Dry Needling, Spinal mobilization, Cryotherapy, Moist heat, Taping, and Manual therapy   PLAN FOR NEXT SESSION: Progress open-chain ankle strengthening/ROM; progress to closed-chain strengthening/ gait training as able    Carmelina DaneYarborough, Jaydyn Bozzo, PT, DPT 06/22/21 10:07 AM

## 2021-06-22 ENCOUNTER — Other Ambulatory Visit: Payer: Self-pay

## 2021-06-22 ENCOUNTER — Ambulatory Visit: Payer: Medicaid Other

## 2021-06-22 DIAGNOSIS — M79671 Pain in right foot: Secondary | ICD-10-CM | POA: Diagnosis not present

## 2021-06-22 DIAGNOSIS — R262 Difficulty in walking, not elsewhere classified: Secondary | ICD-10-CM

## 2021-06-22 DIAGNOSIS — M6281 Muscle weakness (generalized): Secondary | ICD-10-CM

## 2021-06-22 NOTE — Patient Instructions (Signed)
Pt instructed to ice, compress, and elevate her ankle regularly for edema reduction.

## 2021-06-26 ENCOUNTER — Ambulatory Visit: Payer: Medicaid Other

## 2021-06-28 ENCOUNTER — Encounter: Payer: Self-pay | Admitting: Physical Therapy

## 2021-06-28 ENCOUNTER — Ambulatory Visit: Payer: Medicaid Other | Admitting: Physical Therapy

## 2021-06-28 ENCOUNTER — Ambulatory Visit: Payer: Medicaid Other

## 2021-06-28 ENCOUNTER — Other Ambulatory Visit: Payer: Self-pay

## 2021-06-28 DIAGNOSIS — M79671 Pain in right foot: Secondary | ICD-10-CM | POA: Diagnosis not present

## 2021-06-28 DIAGNOSIS — M6281 Muscle weakness (generalized): Secondary | ICD-10-CM

## 2021-06-28 DIAGNOSIS — R262 Difficulty in walking, not elsewhere classified: Secondary | ICD-10-CM

## 2021-06-28 NOTE — Therapy (Addendum)
OUTPATIENT PHYSICAL THERAPY TREATMENT NOTE   Patient Name: Sarah Garrison MRN: [id number] DOB:[birth year], 41 y.o., female Today's Date: 06/28/2021  PCP: Darrold Junker, FNP REFERRING PROVIDER: Landis Martins, DPM   PT End of Session - 06/28/21 1830     Visit Number 3    Number of Visits 17    Date for PT Re-Evaluation 08/09/21    Authorization Type [place] [employer]    Authorization Time Period Pending auth    PT Start Time 1830    PT Stop Time 1910    PT Time Calculation (min) 40 min    Equipment Utilized During Treatment Gait belt    Activity Tolerance Patient tolerated treatment well;Patient limited by pain    Behavior During Therapy WFL for tasks assessed/performed             Past Medical History:  Diagnosis Date   Anxiety    Asthma    Chronic constipation    Chronic shortness of breath    Depression    GERD (gastroesophageal reflux disease)    History of COVID-19 09/29/2019   09-28-2020 result in epic; hospital admission 10-05-2019 for covid pneumonia with hypoxic acute respiratory failure, no oxygen needed when discharged 10-06-2019;  and positive 06-13-2020 result in epic, pt stated no symptoms   History of seizures    (05-04-2021  pt stated was told during hospital admission 05/ 2021 with covid , she had 2 seizures,  stated no medication and no seizure since)   Hypertension    Insulin dependent type 2 diabetes mellitus [facility])    endocrinologist-- Jacolyn Reedy NP;   pt uses insulin pump   Insulin pump in place    Mixed hyperlipidemia    OSA (obstructive sleep apnea)    ( 05-04-2021 pt stated has not used cpap since 05/ 2021) followed by dr byrum;  study in epic 02-16-2019 moderate , titrated cpap 08-28-2019   Plantar fasciitis, right    w/ chronic pain   Past Surgical History:  Procedure Laterality Date   CESAREAN SECTION  1999   PLANTAR FASCIA RELEASE Right 05/08/2021   Procedure: ENDOSCOPIC PLANTAR FASCIOTOMY;  Surgeon: Landis Martins, DPM;   Location: [place];  Service: Podiatry;  Laterality: Right;  WITH BLOCK   STERIOD INJECTION Bilateral 05/08/2021   Procedure: PLATELET RICH PLASMA INJECTION HEELS;  Surgeon: Landis Martins, DPM;  Location: [place];  Service: Podiatry;  Laterality: Bilateral;   TUBAL LIGATION Bilateral 09/29/2006   @[facility] ;   [facility]   VENTRAL HERNIA REPAIR N/A 11/27/2017   Procedure: VENTRAL HERNIA REPAIR ERAS PATHWAY;  Surgeon: Erroll Luna, MD;  Location: [place];  Service: General;  Laterality: N/A;   [place] EXTRACTION     Patient Active Problem List   Diagnosis Date Noted   Hip pain 12/08/2019   Hyperlipidemia associated with type 2 diabetes mellitus ([place]) 11/23/2019   History of COVID-19 10/27/2019   Stuttering 10/27/2019   Depression with anxiety 10/27/2019   Obesity, Class III, BMI 40-49.9 (morbid obesity) ([place]) 06/30/2019   OSA (obstructive sleep apnea) 02/16/2019   Essential hypertension 06/22/2018   Uncontrolled type 2 diabetes mellitus with complication, with long-term current use of insulin 12/13/2016   Asthma 07/30/2016   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/07/2016   DM neuropathy, type II diabetes mellitus ([place]) 08/24/2015    REFERRING DIAG: [id number] (ICD-10-CM) - S/P foot surgery  THERAPY DIAG:  Pain in right foot  Muscle weakness (generalized)  Difficulty in walking, not  elsewhere classified  PERTINENT HISTORY: Right ENDOSCOPIC PLANTAR FASCIOTOMY 05/08/2021  SUBJECTIVE: Pt reports that she is having high levels of pain and is unable to bear weight on her R foot  PAIN:  Are you having pain? Yes NPRS scale: 8/10 Pain location: Medial Rt arch, Rt calf PAIN TYPE: aching and tight Pain description: intermittent    OBJECTIVE:   *Unless otherwise noted, objective information collected previously* DIAGNOSTIC FINDINGS: 05/27/2021 DG Foot Complete Right: IMPRESSION: No acute fracture or dislocation.     COGNITION:          Overall  cognitive status: Within functional limits for tasks assessed                        SENSATION:          Light touch: Appears intact             MUSCLE LENGTH: Gastrocnemius: Severely limited on Lt, not tested on Rt due to heightened pain Soleus: Severely limited on Lt, not tested on Rt due to heightened pain   POSTURE:  WNL   PALPATION: Exquisite TTP to plantar heel and medial arch on Rt   LE AROM/PROM:   AROM Right 06/14/2021 Left 06/14/2021  Ankle dorsiflexion -25 0  Ankle plantarflexion 38 45  Ankle inversion 8 35  Ankle eversion 8 10   (Blank rows = not tested)   LE MMT:   MMT Right 06/14/2021 Left 06/14/2021  Ankle dorsiflexion 2+/5p! 5/5  Ankle plantarflexion 2+/5p! 5/5  Ankle inversion 2+/5p! 5/5  Ankle eversion 2+/5p! 5/5   (Blank rows = not tested)     FUNCTIONAL TESTS:  Deferred due to high pain state and difficulty with standing   GAIT: Distance walked: 80ft Assistive device utilized: None Level of assistance: Complete Independence Comments: Pt walked with antalgic gait, weight bearing through her Rt lateral metatarsals (no heel strike)       TODAY'S TREATMENT:  [facility] Adult PT Treatment:   DATE: 06/28/2021 Therapeutic Exercise: Long-sitting gastrocnemius stretch x2 minutes on Rt Long-sitting soleus stretch x2 minutes on Rt Side-to-side weight shifting on Airex pad in // bars, 2x20 Front-to-back weight shifting on Airex pad in // bars 2x20 Heel toe raise on bosu ball - 3x10 Towel scrunch - 2'  Neuromuscular re-ed: NRE - The pain system as an alarm system for the body Desensitization with tissue and explanation  Gait training With walker concentrating on reducing plantar flexion  [facility] Adult PT Treatment:   DATE: 06/22/2021 Therapeutic Exercise: Long-sitting gastrocnemius stretch x2 minutes on Rt Long-sitting soleus stretch x2 minutes on Rt Side-to-side weight shifting on Airex pad in // bars, 2x20 Front-to-back weight shifting on Airex pad  in // bars 2x20 Mini-squat on Airex pad in // bars 2x8 Tandem stance on Airex pad 2x30sec BIL  Manual Therapy: N/A Neuromuscular re-ed: N/A Therapeutic Activity: N/A Modalities: Game Ready Vasopneumatic device on Rt ankle, 34 degrees, supine with foot elevated x10 minutes Self Care: N/A   [facility] Adult PT Treatment:                                                DATE: 06/14/2021 Therapeutic Exercise: Seated calf stretch with towel x1 min on Rt Seated heel/toe rocking 2x10 BIL Seated ankle eversion/inversion AROM with heel on ground x10 each on Rt Manual Therapy: N/A Neuromuscular re-ed:  N/A Therapeutic Activity: N/A Modalities: N/A Self Care: N/A       PATIENT EDUCATION:  Education details: Pt educated about prognosis, POC, and HEP Person educated: Patient Education method: Consulting civil engineer, Media planner, and Handouts Education comprehension: verbalized understanding and returned demonstration     HOME EXERCISE PROGRAM: Access Code: [id number]   Exercises Seated Calf Stretch with Strap - 1 x daily - 7 x weekly - 2 sets - 1-min hold Seated Toe Raise - 1 x daily - 7 x weekly - 3 sets - 10 reps Seated Heel Raise - 1 x daily - 7 x weekly - 3 sets - 10 reps Seated Ankle Inversion Eversion AROM - 1 x daily - 7 x weekly - 3 sets - 10 reps Seated Ankle Inversion AROM - 1 x daily - 7 x weekly - 3 sets - 10 reps     ASSESSMENT:   CLINICAL IMPRESSION: Stefane is progressing poorly with therapy.  Pt reports no increase in baseline pain following therapy.  Today we concentrated on ankle range of motion and R LE w/b .  Pt limited in all activities d/t extreme pain with even light touch to R heel.  Seems neuroplastic at this point.  We completed some NRE and desensitization with mixed results.  I advised her to touch the heel throughout the day at home with soft objects or gently touch with her hand.  Pt will continue to benefit from skilled physical therapy to address remaining deficits  and achieve listed goals.  Continue per POC.   REHAB POTENTIAL: Good   CLINICAL DECISION MAKING: Stable/uncomplicated   EVALUATION COMPLEXITY: Low     GOALS: Goals reviewed with patient? Yes   SHORT TERM GOALS:   STG Name Target Date Goal status  1 Pt will report understanding and adherence to her HEP in order to promote independence in the management of her primary impairments. Baseline: HEP provided at eval 07/12/2021 INITIAL    LONG TERM GOALS:    LTG Name Target Date Goal status  1 Pt will achieve global Rt ankle strength of 4/5 or higher in order to independently progress her LE strengthening regimen without limitation. Baseline: globally 2+/5 08/09/2021 INITIAL  2 Pt will achieve BIL ankle DF AROM of 5 degrees in order to promote WNL gait pattern. Baseline: See AROM chart 08/09/2021 INITIAL  3 Pt will report ability to walk >20 minutes without the need of a seated rest break with 0-2/10 pain in order to grocery shop with less limitation. Baseline: Pt unable to stand/walk >5-10 minutes due to severe pain 08/09/2021 INITIAL  4 Pt will perform 5 full-weight-bearing squats with 0-2/10 pain in order to lift groceries from the floor without limitation. Baseline: Unable to perform a squat due to pain. 08/09/2021 INITIAL    PLAN: PT FREQUENCY: 2x/week   PT DURATION: 8 weeks   PLANNED INTERVENTIONS: Therapeutic exercises, Therapeutic activity, Neuro Muscular re-education, Balance training, Gait training, Patient/Family education, Joint mobilization, Stair training, Dry Needling, Spinal mobilization, Cryotherapy, Moist heat, Taping, and Manual therapy   PLAN FOR NEXT SESSION: Progress open-chain ankle strengthening/ROM; progress to closed-chain strengthening/ gait training as able    Mathis Dad PT 06/28/21 6:30 PM

## 2021-07-03 ENCOUNTER — Ambulatory Visit: Payer: Medicaid Other

## 2021-07-03 NOTE — Therapy (Signed)
OUTPATIENT PHYSICAL THERAPY TREATMENT NOTE   Patient Name: Sarah Garrison MRN: [id number] DOB:02-16-1981, 41 y.o., female Today's Date: 07/04/2021  PCP: Darrold Junker, FNP REFERRING PROVIDER: Darrold Junker, FNP   PT End of Session - 07/04/21 1831     Visit Number 4    Number of Visits 17    Date for PT Re-Evaluation 08/09/21    Authorization Type [employer]    Authorization Time Period 1/16-2/17/23    Authorization - Visit Number 3    Authorization - Number of Visits 16    PT Start Time [id number]    PT Stop Time 1908    PT Time Calculation (min) 38 min    Equipment Utilized During Treatment --    Activity Tolerance Patient limited by pain    Behavior During Therapy WFL for tasks assessed/performed              Past Medical History:  Diagnosis Date   Anxiety    Asthma    Chronic constipation    Chronic shortness of breath    Depression    GERD (gastroesophageal reflux disease)    History of COVID-19 09/29/2019   09-28-2020 result in epic; hospital admission 10-05-2019 for covid pneumonia with hypoxic acute respiratory failure, no oxygen needed when discharged 10-06-2019;  and positive 06-13-2020 result in epic, pt stated no symptoms   History of seizures    (05-04-2021  pt stated was told during hospital admission 05/ 2021 with covid , she had 2 seizures,  stated no medication and no seizure since)   Hypertension    Insulin dependent type 2 diabetes mellitus [facility])    endocrinologist-- Jacolyn Reedy NP;   pt uses insulin pump   Insulin pump in place    Mixed hyperlipidemia    OSA (obstructive sleep apnea)    ( 05-04-2021 pt stated has not used cpap since 05/ 2021) followed by dr byrum;  study in epic 02-16-2019 moderate , titrated cpap 08-28-2019   Plantar fasciitis, right    w/ chronic pain   Past Surgical History:  Procedure Laterality Date   CESAREAN SECTION  1999   PLANTAR FASCIA RELEASE Right 05/08/2021   Procedure: ENDOSCOPIC PLANTAR  FASCIOTOMY;  Surgeon: Landis Martins, DPM;  Location: [place];  Service: Podiatry;  Laterality: Right;  WITH BLOCK   STERIOD INJECTION Bilateral 05/08/2021   Procedure: PLATELET RICH PLASMA INJECTION HEELS;  Surgeon: Landis Martins, DPM;  Location: [place];  Service: Podiatry;  Laterality: Bilateral;   TUBAL LIGATION Bilateral 09/29/2006   @[facility] ;   [facility]   VENTRAL HERNIA REPAIR N/A 11/27/2017   Procedure: VENTRAL HERNIA REPAIR ERAS PATHWAY;  Surgeon: Erroll Luna, MD;  Location: [place];  Service: General;  Laterality: N/A;   [place] EXTRACTION     Patient Active Problem List   Diagnosis Date Noted   Hip pain 12/08/2019   Hyperlipidemia associated with type 2 diabetes mellitus ([place]) 11/23/2019   History of COVID-19 10/27/2019   Stuttering 10/27/2019   Depression with anxiety 10/27/2019   Obesity, Class III, BMI 40-49.9 (morbid obesity) ([place]) 06/30/2019   OSA (obstructive sleep apnea) 02/16/2019   Essential hypertension 06/22/2018   Uncontrolled type 2 diabetes mellitus with complication, with long-term current use of insulin 12/13/2016   Asthma 07/30/2016   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/07/2016   DM neuropathy, type II diabetes mellitus ([place]) 08/24/2015    REFERRING DIAG: [id number] (ICD-10-CM) - S/P foot surgery  THERAPY DIAG:  Pain in right foot  Muscle weakness (generalized)  Difficulty in walking, not elsewhere classified  PERTINENT HISTORY: Right ENDOSCOPIC PLANTAR FASCIOTOMY 05/08/2021  SUBJECTIVE:  "Oh my god it hurts. I need to be iced down. I have been on my feet all day and this rain ain't helping."  PAIN:  Are you having pain? Yes NPRS scale: 10/10 Pain location: Medial Rt arch PAIN TYPE: aching and tight Pain description: constant    OBJECTIVE:   *Unless otherwise noted, objective information collected previously* DIAGNOSTIC FINDINGS: 05/27/2021 DG Foot Complete Right: IMPRESSION: No acute fracture or  dislocation.     COGNITION:          Overall cognitive status: Within functional limits for tasks assessed                        SENSATION:          Light touch: Appears intact             MUSCLE LENGTH: Gastrocnemius: Severely limited on Lt, not tested on Rt due to heightened pain Soleus: Severely limited on Lt, not tested on Rt due to heightened pain   POSTURE:  WNL   PALPATION: Exquisite TTP to plantar heel and medial arch on Rt 07/04/21 significant palpable tenderness Rt heel; no warmth noted    LE AROM/PROM:   AROM Right 06/14/2021 Left 06/14/2021  Ankle dorsiflexion -25 0  Ankle plantarflexion 38 45  Ankle inversion 8 35  Ankle eversion 8 10   (Blank rows = not tested)   LE MMT:   MMT Right 06/14/2021 Left 06/14/2021  Ankle dorsiflexion 2+/5p! 5/5  Ankle plantarflexion 2+/5p! 5/5  Ankle inversion 2+/5p! 5/5  Ankle eversion 2+/5p! 5/5   (Blank rows = not tested)     FUNCTIONAL TESTS:  Deferred due to high pain state and difficulty with standing   GAIT: Distance walked: 26ft Assistive device utilized: None Level of assistance: Complete Independence Comments: Pt walked with antalgic gait, weight bearing through her Rt lateral metatarsals (no heel strike)  07/04/21: maintains plantarflexion during stance on the RLE  OBSERVATION: 07/04/21 mild redness and swelling about the heel    TODAY'S TREATMENT: [facility] Adult PT Treatment:                                                DATE: 07/04/21 Therapeutic Exercise: Digit flexion/extension ROM 2 x 10  Toe raises 2 x 10 seated (maintains Rt foot in supination, unable to achieve neutral due to pain) Heel raises 2 x 10 seated (maintains Rt foot in supination, unable to achieve neutral due to pain) Seated ankle inversion and eversion AROM in seated (maintains Rt foot in supination, unable to achieve neutral due to pain) Seated marble pickups 3 x 5  Seated rockerboard 2 x 10 therapist assist to maintain knee  alignment Ankle pumps 2 x 10  Standing weight shifts lateral 1 x 5; unable to achieve foot flat on the RLE secondary to pain    [facility] Adult PT Treatment:   DATE: 06/28/2021 Therapeutic Exercise: Long-sitting gastrocnemius stretch x2 minutes on Rt Long-sitting soleus stretch x2 minutes on Rt Side-to-side weight shifting on Airex pad in // bars, 2x20 Front-to-back weight shifting on Airex pad in // bars 2x20 Heel toe raise on bosu ball - 3x10 Towel scrunch - 2'  Neuromuscular re-ed:  NRE - The pain system as an alarm system for the body Desensitization with tissue and explanation  Gait training With walker concentrating on reducing plantar flexion  [facility] Adult PT Treatment:   DATE: 06/22/2021 Therapeutic Exercise: Long-sitting gastrocnemius stretch x2 minutes on Rt Long-sitting soleus stretch x2 minutes on Rt Side-to-side weight shifting on Airex pad in // bars, 2x20 Front-to-back weight shifting on Airex pad in // bars 2x20 Mini-squat on Airex pad in // bars 2x8 Tandem stance on Airex pad 2x30sec BIL  Manual Therapy: N/A Neuromuscular re-ed: N/A Therapeutic Activity: N/A Modalities: Game Ready Vasopneumatic device on Rt ankle, 34 degrees, supine with foot elevated x10 minutes Self Care: N/A   [facility] Adult PT Treatment:                                                DATE: 06/14/2021 Therapeutic Exercise: Seated calf stretch with towel x1 min on Rt Seated heel/toe rocking 2x10 BIL Seated ankle eversion/inversion AROM with heel on ground x10 each on Rt  Manual Therapy: N/A Neuromuscular re-ed: N/A Therapeutic Activity: N/A Modalities: N/A Self Care: N/A       PATIENT EDUCATION:  Education details:PT on hold until physician f/u  Person educated: Patient Education method: Explanation Education comprehension: verbalized understanding      HOME EXERCISE PROGRAM: Access Code: [id number]   Exercises Seated Calf Stretch with Strap - 1 x daily - 7 x weekly - 2 sets -  1-min hold Seated Toe Raise - 1 x daily - 7 x weekly - 3 sets - 10 reps Seated Heel Raise - 1 x daily - 7 x weekly - 3 sets - 10 reps Seated Ankle Inversion Eversion AROM - 1 x daily - 7 x weekly - 3 sets - 10 reps Seated Ankle Inversion AROM - 1 x daily - 7 x weekly - 3 sets - 10 reps     ASSESSMENT:   CLINICAL IMPRESSION: Patient is 8 weeks post-op reporting continued high pain levels about the Rt heel rated as 10/10 upon arrival. Her gait remains significantly antalgic secondary to pain with inability to achieve foot flat during stance phase on the RLE. She is noted to have redness about the heel, though no warmth . She has very low tolerance to ther ex secondary to pain with inability to even achieve neutral foot position in sitting. She has f/u appointment with surgeon on Thursday, so at this time PT will be placed on hold until f/u with Dr. Cannon Kettle given high pain levels that are limiting progression in PT thus far.    REHAB POTENTIAL: Good   CLINICAL DECISION MAKING: Stable/uncomplicated   EVALUATION COMPLEXITY: Low     GOALS: Goals reviewed with patient? Yes   SHORT TERM GOALS:   STG Name Target Date Goal status  1 Pt will report understanding and adherence to her HEP in order to promote independence in the management of her primary impairments. Baseline: HEP provided at eval 07/12/2021 INITIAL    LONG TERM GOALS:    LTG Name Target Date Goal status  1 Pt will achieve global Rt ankle strength of 4/5 or higher in order to independently progress her LE strengthening regimen without limitation. Baseline: globally 2+/5 08/09/2021 INITIAL  2 Pt will achieve BIL ankle DF AROM of 5 degrees in order to promote WNL gait pattern. Baseline: See AROM chart  08/09/2021 INITIAL  3 Pt will report ability to walk >20 minutes without the need of a seated rest break with 0-2/10 pain in order to grocery shop with less limitation. Baseline: Pt unable to stand/walk >5-10 minutes due to severe pain  08/09/2021 INITIAL  4 Pt will perform 5 full-weight-bearing squats with 0-2/10 pain in order to lift groceries from the floor without limitation. Baseline: Unable to perform a squat due to pain. 08/09/2021 INITIAL    PLAN: PT FREQUENCY: 2x/week   PT DURATION: 8 weeks   PLANNED INTERVENTIONS: Therapeutic exercises, Therapeutic activity, Neuro Muscular re-education, Balance training, Gait training, Patient/Family education, Joint mobilization, Stair training, Dry Needling, Spinal mobilization, Cryotherapy, Moist heat, Taping, and Manual therapy   PLAN FOR NEXT SESSION: PT on hold until f/u with Dr. Roxanne Gates, PT, DPT, ATC 07/04/21 7:12 PM

## 2021-07-04 ENCOUNTER — Ambulatory Visit: Payer: Medicaid Other

## 2021-07-04 ENCOUNTER — Other Ambulatory Visit: Payer: Self-pay

## 2021-07-04 DIAGNOSIS — M79671 Pain in right foot: Secondary | ICD-10-CM | POA: Diagnosis not present

## 2021-07-04 DIAGNOSIS — M6281 Muscle weakness (generalized): Secondary | ICD-10-CM

## 2021-07-04 DIAGNOSIS — R262 Difficulty in walking, not elsewhere classified: Secondary | ICD-10-CM

## 2021-07-05 ENCOUNTER — Ambulatory Visit: Payer: Medicaid Other

## 2021-07-06 ENCOUNTER — Other Ambulatory Visit: Payer: Self-pay

## 2021-07-06 ENCOUNTER — Ambulatory Visit (INDEPENDENT_AMBULATORY_CARE_PROVIDER_SITE_OTHER): Payer: Medicaid Other | Admitting: Sports Medicine

## 2021-07-06 DIAGNOSIS — M722 Plantar fascial fibromatosis: Secondary | ICD-10-CM

## 2021-07-06 DIAGNOSIS — Z9889 Other specified postprocedural states: Secondary | ICD-10-CM

## 2021-07-06 DIAGNOSIS — M79671 Pain in right foot: Secondary | ICD-10-CM

## 2021-07-06 DIAGNOSIS — E1140 Type 2 diabetes mellitus with diabetic neuropathy, unspecified: Secondary | ICD-10-CM

## 2021-07-06 DIAGNOSIS — Z794 Long term (current) use of insulin: Secondary | ICD-10-CM

## 2021-07-06 MED ORDER — MELOXICAM 7.5 MG PO TABS: 7.5000 mg | ORAL_TABLET | Freq: Two times a day (BID) | ORAL | 0 refills | Status: DC

## 2021-07-06 NOTE — Progress Notes (Signed)
Subjective: Sarah Garrison is a 41 y.o. female patient seen today in office for POV # 4 (DOS 05-08-21), S/P Right EPF and bil heel PRP injection. Patient reports that she still has pain cannot put her heel down.Patient reports that she had to cancel her therapy session on yesterday due to pain and swelling in the heel.  No other issues noted.   Patient Active Problem List   Diagnosis Date Noted   Hip pain 12/08/2019   Hyperlipidemia associated with type 2 diabetes mellitus (HCC) 11/23/2019   History of COVID-19 10/27/2019   Stuttering 10/27/2019   Depression with anxiety 10/27/2019   Obesity, Class III, BMI 40-49.9 (morbid obesity) (HCC) 06/30/2019   OSA (obstructive sleep apnea) 02/16/2019   Essential hypertension 06/22/2018   Uncontrolled type 2 diabetes mellitus with complication, with long-term current use of insulin 12/13/2016   Asthma 07/30/2016   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/07/2016   DM neuropathy, type II diabetes mellitus (HCC) 08/24/2015    Current Outpatient Medications on File Prior to Visit  Medication Sig Dispense Refill   albuterol (VENTOLIN HFA) 108 (90 Base) MCG/ACT inhaler Inhale 1-2 puffs into the lungs every 6 (six) hours as needed for wheezing or shortness of breath. (Patient not taking: Reported on 06/14/2021) 18 g 1   atorvastatin (LIPITOR) 20 MG tablet Take 1 tablet (20 mg total) by mouth daily. 90 tablet 1   Continuous Blood Gluc Receiver (DEXCOM G6 RECEIVER) DEVI 1 each by Misc.(Non-Drug; Combo Route) route daily. (Patient not taking: Reported on 06/14/2021)     Continuous Blood Gluc Sensor (DEXCOM G6 SENSOR) MISC Use to continuously monitor glucose.  Change every 10 days (Patient not taking: Reported on 06/14/2021) 3 each 0   Continuous Blood Gluc Transmit (DEXCOM G6 TRANSMITTER) MISC USE TO CHECK BLOOD SUGAR EVERY 3 MONTHS (Patient not taking: Reported on 06/14/2021)     fluticasone-salmeterol (ADVAIR HFA) 115-21 MCG/ACT inhaler Inhale 2 puffs into the lungs 2 (two)  times daily. (Patient not taking: Reported on 05/04/2021) 1 Inhaler 12   ibuprofen (ADVIL) 800 MG tablet Take 1 tablet (800 mg total) by mouth every 8 (eight) hours as needed. 30 tablet 0   insulin aspart (NOVOLOG FLEXPEN) 100 UNIT/ML FlexPen Inject into the skin 3 (three) times daily before meals. ONLY WHEN INSULIN PUMP MALFUNCTION'S PER ENDOCRINOLOGY DIRECTION     insulin aspart (NOVOLOG) 100 UNIT/ML injection USE VIA INSULIN PUMP. MAX TOTAL DAILY DOSE OF 300 UNITS (Patient not taking: Reported on 06/14/2021) 90 mL 5   insulin aspart (NOVOLOG) 100 UNIT/ML injection USE VIA INSULIN PUMP. MAX TOTAL DAILY DOSE OF 300 UNITS 90 mL 5   insulin glargine, 1 Unit Dial, (TOUJEO SOLOSTAR) 300 UNIT/ML Solostar Pen Administer 50 units once daily only if insulin pump malfunction (Patient not taking: Reported on 05/04/2021) 9 mL 1   Insulin Human (INSULIN PUMP) SOLN Inject 1 each into the skin See admin instructions. Medication: Novolog 100 units/ml injection. Takes 100 units via pump per patient. (Patient not taking: Reported on 06/14/2021)     Insulin Syringe-Needle U-100 31G X 5/16" 1 ML MISC inject 10 units into the skin 3 times daily. use to inject novolog based on insulin to card ratio- max daily dose 150 units (Patient not taking: Reported on 06/14/2021) 100 each 2   linaclotide (LINZESS) 72 MCG capsule Take 1 capsule (72 mcg total) by mouth daily before breakfast. (Patient not taking: Reported on 06/14/2021) 30 capsule 3   lisinopril (ZESTRIL) 20 MG tablet Take 1 tablet (20 mg  total) by mouth daily. 90 tablet 2   medroxyPROGESTERone Acetate (DEPO-PROVERA IM) Inject into the muscle every 3 (three) months. (Patient not taking: Reported on 06/14/2021)     Misc. Devices MISC CPAP therapy on autopap 5-15.  Needs Small size [employer] Full Face Mask Simplus  mask and heated humidification. (Patient not taking: Reported on 06/14/2021) 1 each 0   naproxen (EC NAPROSYN) 500 MG EC tablet Take 1 tablet (500 mg total) by  mouth 2 (two) times daily with a meal. (Patient not taking: Reported on 06/14/2021) 60 tablet 2   ondansetron (ZOFRAN ODT) 8 MG disintegrating tablet Take 1 tablet (8 mg total) by mouth every 8 (eight) hours as needed for nausea or vomiting. (Patient not taking: Reported on 06/14/2021) 20 tablet 0   pantoprazole (PROTONIX) 40 MG tablet TAKE 1 TABLET (40 MG TOTAL) BY MOUTH DAILY. (Patient not taking: Reported on 06/14/2021) 30 tablet 3   pregabalin (LYRICA) 75 MG capsule Take 1 capsule (75 mg total) by mouth 2 (two) times daily. 30 capsule 1   promethazine (PHENERGAN) 12.5 MG tablet Take 1 tablet (12.5 mg total) by mouth every 6 (six) hours as needed for nausea or vomiting. (Patient not taking: Reported on 06/14/2021) 30 tablet 0   Syringe/Needle, Disp, (SYRINGE 3CC/21GX1") 21G X 1" 3 ML MISC Use to fill pump cartridge with insulin as directed. (Patient not taking: Reported on 06/14/2021)     traMADol (ULTRAM) 50 MG tablet Take 1 tablet (50 mg total) by mouth every 6 (six) hours as needed. (Patient not taking: Reported on 06/14/2021) 20 tablet 0   TRUEPLUS PEN NEEDLES 32G X 4 MM MISC USE 3 TIMES DAILY AS DIRECTED (Patient not taking: Reported on 06/14/2021) 100 each 5   VITAMIN D PO Take 1 tablet by mouth 2 (two) times daily. (Patient not taking: Reported on 05/04/2021)     [DISCONTINUED] ARIPiprazole (ABILIFY) 5 MG tablet Take 1 tablet (5 mg total) by mouth daily. 30 tablet 2   No current facility-administered medications on file prior to visit.    Allergies  Allergen Reactions   Morphine And Related Shortness Of Breath   Glyburide Diarrhea and Other (See Comments)    Reaction:  Nose bleeds    Hydrocodone Other (See Comments)    Pt states that this medication makes her "dream about rabbits chasing her"   Ivp Dye [Iodinated Contrast Media] Other (See Comments)    Shortness of breath.     Oxycodone Other (See Comments)    Pt states that this medication makes her "dream about rabbits chasing" her.       Objective: There were no vitals filed for this visit.  General: No acute distress, AAOx3  Right foot: Surgical site well-healed with mild dry skin at incision areas, +swelling to the heel and dorsal lateral foot, no warmth, no drainage, no signs of infection noted, Capillary fill time <3 seconds in all digits, gross sensation present via light touch to right foot. Guarding due to pain on the right heel. No pain with calf compression.   Assessment and Plan:  Problem List Items Addressed This Visit       Endocrine   DM neuropathy, type II diabetes mellitus (HCC)   Other Visit Diagnoses     S/P foot surgery    -  Primary   Intractable right heel pain       Plantar fasciitis, right           -Patient seen and evaluated -Rx Mobic for  pain and inflammation -May weight-bear to tolerance -Advised patient to continue with physical therapy and recommend that therapy at this time work on reducing pain and edema before today do any mobility or manual therapy -Advised patient that it may take her longer to heal due to the diabetes and her history of chronic pain in this area -Dispensed at no charge cervical compression anklet for patient to wear as directed -Return to office 1 month or sooner problems or issues arise Asencion Islamitorya Lillard Bailon, DPM

## 2021-07-06 NOTE — Patient Instructions (Signed)
Can have pedicure and may gently scrub the heel as needed

## 2021-07-13 ENCOUNTER — Ambulatory Visit: Payer: Medicaid Other | Attending: Sports Medicine | Admitting: Physical Therapy

## 2021-07-13 ENCOUNTER — Encounter: Payer: Self-pay | Admitting: Physical Therapy

## 2021-07-13 ENCOUNTER — Encounter (HOSPITAL_COMMUNITY): Payer: Self-pay

## 2021-07-13 ENCOUNTER — Other Ambulatory Visit: Payer: Self-pay

## 2021-07-13 ENCOUNTER — Ambulatory Visit (HOSPITAL_COMMUNITY)
Admission: EM | Admit: 2021-07-13 | Discharge: 2021-07-13 | Disposition: A | Discharge status: Home / Self Care | Payer: Medicaid Other | Attending: Internal Medicine | Admitting: Internal Medicine

## 2021-07-13 ENCOUNTER — Ambulatory Visit: Payer: Medicaid Other

## 2021-07-13 DIAGNOSIS — R262 Difficulty in walking, not elsewhere classified: Secondary | ICD-10-CM | POA: Diagnosis present

## 2021-07-13 DIAGNOSIS — M79671 Pain in right foot: Secondary | ICD-10-CM | POA: Diagnosis not present

## 2021-07-13 DIAGNOSIS — R000 Tachycardia, unspecified: Secondary | ICD-10-CM

## 2021-07-13 DIAGNOSIS — M6281 Muscle weakness (generalized): Secondary | ICD-10-CM | POA: Insufficient documentation

## 2021-07-13 MED ORDER — IBUPROFEN 800 MG PO TABS: 800.0000 mg | ORAL_TABLET | Freq: Three times a day (TID) | ORAL | 0 refills | Status: DC | PRN

## 2021-07-13 MED ORDER — KETOROLAC TROMETHAMINE 30 MG/ML IJ SOLN
30.0000 mg | Freq: Once | INTRAMUSCULAR | Status: AC
  Administered 2021-07-13: 30 mg via INTRAMUSCULAR

## 2021-07-13 NOTE — Discharge Instructions (Addendum)
Please take ibuprofen as prescribed Continue with physical therapy. Please call the cardiologist office for an appointment for further evaluation of tachycardia.

## 2021-07-13 NOTE — ED Triage Notes (Signed)
Pt presents with intermittent times of her heart rate being high; pt states that a regular thing for her but her physical therapist wouldn't see her today because it was high.

## 2021-07-13 NOTE — Therapy (Signed)
OUTPATIENT PHYSICAL THERAPY TREATMENT NOTE   Patient Name: Sarah Garrison MRN: [id number] DOB:Jan 05, 1981, 41 y.o., female Today's Date: 07/13/2021  PCP: Maggie FontHuffman, Jamie J, FNP REFERRING PROVIDER: Asencion IslamStover, Titorya, DPM   PT End of Session - 07/13/21 1749     Visit Number 5    Number of Visits 17    Date for PT Re-Evaluation 08/09/21    Authorization Type [employer]    Authorization Time Period 1/16-2/17/23    Authorization - Visit Number 4    Authorization - Number of Visits 16    PT Start Time 0547    PT Stop Time 0628    PT Time Calculation (min) 41 min    Activity Tolerance Patient limited by pain    Behavior [facility] tasks assessed/performed              Past Medical History:  Diagnosis Date   Anxiety    Asthma    Chronic constipation    Chronic shortness of breath    Depression    GERD (gastroesophageal reflux disease)    History of COVID-19 09/29/2019   09-28-2020 result in epic; hospital admission 10-05-2019 for covid pneumonia with hypoxic acute respiratory failure, no oxygen needed when discharged 10-06-2019;  and positive 06-13-2020 result in epic, pt stated no symptoms   History of seizures    (05-04-2021  pt stated was told during hospital admission 05/ 2021 with covid , she had 2 seizures,  stated no medication and no seizure since)   Hypertension    Insulin dependent type 2 diabetes mellitus (HCC)    endocrinologist-- Fredia Sorrowjamie huffman NP;   pt uses insulin pump   Insulin pump in place    Mixed hyperlipidemia    OSA (obstructive sleep apnea)    ( 05-04-2021 pt stated has not used cpap since 05/ 2021) followed by dr byrum;  study in epic 02-16-2019 moderate , titrated cpap 08-28-2019   Plantar fasciitis, right    w/ chronic pain   Past Surgical History:  Procedure Laterality Date   CESAREAN SECTION  1999   PLANTAR FASCIA RELEASE Right 05/08/2021   Procedure: ENDOSCOPIC PLANTAR FASCIOTOMY;  Surgeon: Asencion IslamStover, Titorya, DPM;   Location: [facility];  Service: Podiatry;  Laterality: Right;  WITH BLOCK   STERIOD INJECTION Bilateral 05/08/2021   Procedure: PLATELET RICH PLASMA INJECTION HEELS;  Surgeon: Asencion IslamStover, Titorya, DPM;  Location: [facility];  Service: Podiatry;  Laterality: Bilateral;   TUBAL LIGATION Bilateral 09/29/2006   @[facility] ;   [facility]   VENTRAL HERNIA REPAIR N/A 11/27/2017   Procedure: VENTRAL HERNIA REPAIR ERAS PATHWAY;  Surgeon: Harriette Bouillonornett, Thomas, MD;  Location: [facility];  Service: General;  Laterality: N/A;   WISDOM TOOTH EXTRACTION     Patient Active Problem List   Diagnosis Date Noted   Hip pain 12/08/2019   Hyperlipidemia associated with type 2 diabetes mellitus (HCC) 11/23/2019   History of COVID-19 10/27/2019   Stuttering 10/27/2019   Depression with anxiety 10/27/2019   Obesity, Class III, BMI 40-49.9 (morbid obesity) (HCC) 06/30/2019   OSA (obstructive sleep apnea) 02/16/2019   Essential hypertension 06/22/2018   Uncontrolled type 2 diabetes mellitus with complication, with long-term current use of insulin 12/13/2016   Asthma 07/30/2016   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/07/2016   DM neuropathy, type II diabetes mellitus (HCC) 08/24/2015    REFERRING DIAG: [id number] (ICD-10-CM) - S/P foot surgery  THERAPY DIAG:  Pain in right foot  Muscle weakness (generalized)  Difficulty in walking, not elsewhere classified  PERTINENT HISTORY: Right ENDOSCOPIC PLANTAR FASCIOTOMY 05/08/2021  SUBJECTIVE:  Pt reports that her foot has not been doing well.  She went to the podiatrist and was told that her foot would take a while to heal d/t her diabetes  PAIN:  Are you having pain? Yes NPRS scale: 10/10 Pain location: Medial Rt arch PAIN TYPE: aching and tight Pain description: constant    OBJECTIVE:   *Unless otherwise noted, objective information collected previously* DIAGNOSTIC FINDINGS: 05/27/2021 DG Foot Complete Right: IMPRESSION: No acute fracture or  dislocation.     COGNITION:          Overall cognitive status: Within functional limits for tasks assessed                        SENSATION:          Light touch: Appears intact             MUSCLE LENGTH: Gastrocnemius: Severely limited on Lt, not tested on Rt due to heightened pain Soleus: Severely limited on Lt, not tested on Rt due to heightened pain   POSTURE:  WNL   PALPATION: Exquisite TTP to plantar heel and medial arch on Rt 07/04/21 significant palpable tenderness Rt heel; no warmth noted    LE AROM/PROM:   AROM Right 06/14/2021 Left 06/14/2021  Ankle dorsiflexion -25 0  Ankle plantarflexion 38 45  Ankle inversion 8 35  Ankle eversion 8 10   (Blank rows = not tested)   LE MMT:   MMT Right 06/14/2021 Left 06/14/2021  Ankle dorsiflexion 2+/5p! 5/5  Ankle plantarflexion 2+/5p! 5/5  Ankle inversion 2+/5p! 5/5  Ankle eversion 2+/5p! 5/5   (Blank rows = not tested)     FUNCTIONAL TESTS:  Deferred due to high pain state and difficulty with standing   GAIT: Distance walked: 61ft Assistive device utilized: None Level of assistance: Complete Independence Comments: Pt walked with antalgic gait, weight bearing through her Rt lateral metatarsals (no heel strike)  07/04/21: maintains plantarflexion during stance on the RLE  OBSERVATION: 07/04/21 mild redness and swelling about the heel    TODAY'S TREATMENT:  [facility] Adult PT Treatment:                                                DATE: 07/13/21 Therapeutic Exercise:  nu-step L5 81m while taking subjective and planning session with patient Bike attempted, but not tolerated  Therapeutic Activity - after finishing warm up I took dorsalis pedis pulse and it felt like patient was tachycardic.  Using a pulse ox, HR was measured variably between 115 and 126 after extended rest.  I discussed at length that this was not a normal pulse rate with Sarah Garrison and recommended that she go the urgent care or the ED to rule out a serious  cause; she agrees to plan.   [facility] Adult PT Treatment:                                                DATE: 07/04/21 Therapeutic Exercise: Digit flexion/extension ROM 2 x 10  Toe raises 2 x 10 seated (maintains Rt foot in supination, unable to achieve neutral  due to pain) Heel raises 2 x 10 seated (maintains Rt foot in supination, unable to achieve neutral due to pain) Seated ankle inversion and eversion AROM in seated (maintains Rt foot in supination, unable to achieve neutral due to pain) Seated marble pickups 3 x 5  Seated rockerboard 2 x 10 therapist assist to maintain knee alignment Ankle pumps 2 x 10  Standing weight shifts lateral 1 x 5; unable to achieve foot flat on the RLE secondary to pain    [facility] Adult PT Treatment:   DATE: 06/28/2021 Therapeutic Exercise: Long-sitting gastrocnemius stretch x2 minutes on Rt Long-sitting soleus stretch x2 minutes on Rt Side-to-side weight shifting on Airex pad in // bars, 2x20 Front-to-back weight shifting on Airex pad in // bars 2x20 Heel toe raise on bosu ball - 3x10 Towel scrunch - 2'  Neuromuscular re-ed: NRE - The pain system as an alarm system for the body Desensitization with tissue and explanation  Gait training With walker concentrating on reducing plantar flexion  [facility] Adult PT Treatment:   DATE: 06/22/2021 Therapeutic Exercise: Long-sitting gastrocnemius stretch x2 minutes on Rt Long-sitting soleus stretch x2 minutes on Rt Side-to-side weight shifting on Airex pad in // bars, 2x20 Front-to-back weight shifting on Airex pad in // bars 2x20 Mini-squat on Airex pad in // bars 2x8 Tandem stance on Airex pad 2x30sec BIL  Manual Therapy: N/A Neuromuscular re-ed: N/A Therapeutic Activity: N/A Modalities: Game Ready Vasopneumatic device on Rt ankle, 34 degrees, supine with foot elevated x10 minutes Self Care: N/A   [facility] Adult PT Treatment:                                                DATE: 06/14/2021 Therapeutic  Exercise: Seated calf stretch with towel x1 min on Rt Seated heel/toe rocking 2x10 BIL Seated ankle eversion/inversion AROM with heel on ground x10 each on Rt  Manual Therapy: N/A Neuromuscular re-ed: N/A Therapeutic Activity: N/A Modalities: N/A Self Care: N/A       PATIENT EDUCATION:  Education details:PT on hold until physician f/u  Person educated: Patient Education method: Explanation Education comprehension: verbalized understanding      HOME EXERCISE PROGRAM: Access Code: [email or web address]   Exercises Seated Calf Stretch with Strap - 1 x daily - 7 x weekly - 2 sets - 1-min hold Seated Toe Raise - 1 x daily - 7 x weekly - 3 sets - 10 reps Seated Heel Raise - 1 x daily - 7 x weekly - 3 sets - 10 reps Seated Ankle Inversion Eversion AROM - 1 x daily - 7 x weekly - 3 sets - 10 reps Seated Ankle Inversion AROM - 1 x daily - 7 x weekly - 3 sets - 10 reps     ASSESSMENT:   CLINICAL IMPRESSION: After finishing warm up I took dorsalis pedis pulse and it felt like patient was tachycardic.  Using a pulse ox, HR was measured variably between 115 and 126 after extended rest.  She stated that her watch often reads a pulse >125 at rest; this has been going on for some time.  She has not talked to her PCP (although she was told to by a different provider) about this as she was unable to get an appt.  I discussed with Sarah Garrison at length that this was not a normal pulse rate and recommended that she go  the urgent care or the ED to rule out a serious cause; she agrees to plan and states she will go immediately.    REHAB POTENTIAL: Good   CLINICAL DECISION MAKING: Stable/uncomplicated   EVALUATION COMPLEXITY: Low     GOALS: Goals reviewed with patient? Yes   SHORT TERM GOALS:   STG Name Target Date Goal status  1 Pt will report understanding and adherence to her HEP in order to promote independence in the management of her primary impairments. Baseline: HEP provided at eval 07/12/2021  Ongoing - pt limited d/t pain    LONG TERM GOALS:    LTG Name Target Date Goal status  1 Pt will achieve global Rt ankle strength of 4/5 or higher in order to independently progress her LE strengthening regimen without limitation. Baseline: globally 2+/5 08/09/2021 INITIAL  2 Pt will achieve BIL ankle DF AROM of 5 degrees in order to promote WNL gait pattern. Baseline: See AROM chart 08/09/2021 INITIAL  3 Pt will report ability to walk >20 minutes without the need of a seated rest break with 0-2/10 pain in order to grocery shop with less limitation. Baseline: Pt unable to stand/walk >5-10 minutes due to severe pain 08/09/2021 INITIAL  4 Pt will perform 5 full-weight-bearing squats with 0-2/10 pain in order to lift groceries from the floor without limitation. Baseline: Unable to perform a squat due to pain. 08/09/2021 INITIAL    PLAN: PT FREQUENCY: 2x/week   PT DURATION: 8 weeks   PLANNED INTERVENTIONS: Therapeutic exercises, Therapeutic activity, Neuro Muscular re-education, Balance training, Gait training, Patient/Family education, Joint mobilization, Stair training, Dry Needling, Spinal mobilization, Cryotherapy, Moist heat, Taping, and Manual therapy   PLAN FOR NEXT SESSION: PT on hold until cleared by MD for abnormal heart rate   Kimberlee Nearing Gorman Safi PT 07/13/21 6:22 PM

## 2021-07-14 NOTE — ED Provider Notes (Signed)
[facility]    CSN: [id number] Arrival date & time: 07/13/21  1820      History   Chief Complaint Chief Complaint  Patient presents with   Heart Racing     HPI Sarah Garrison is a 41 y.o. female comes to the urgent care for elevated heart rate.  Patient was sent here by physical therapist on account of heart rate greater than 100.  Patient denies any chest pain or chest pressure.  No dizziness, night sweats or nausea.  Patient recently had plantar fasciitis surgery done.  She continues to have significant swelling of the right heel associated with pain.  Pain is worse with walking.  She has been icing the right heel and taking anti-inflammatory agents.  She is closely followed by the podiatrist who did the procedure.  No fever or chills.  No dysuria urgency or frequency.  Heel pain is currently of moderate severity.Marland Kitchen   HPI  Past Medical History:  Diagnosis Date   Anxiety    Asthma    Chronic constipation    Chronic shortness of breath    Depression    GERD (gastroesophageal reflux disease)    History of COVID-19 09/29/2019   09-28-2020 result in epic; hospital admission 10-05-2019 for covid pneumonia with hypoxic acute respiratory failure, no oxygen needed when discharged 10-06-2019;  and positive 06-13-2020 result in epic, pt stated no symptoms   History of seizures    (05-04-2021  pt stated was told during hospital admission 05/ 2021 with covid , she had 2 seizures,  stated no medication and no seizure since)   Hypertension    Insulin dependent type 2 diabetes mellitus (HCC)    endocrinologist-- Fredia Sorrow NP;   pt uses insulin pump   Insulin pump in place    Mixed hyperlipidemia    OSA (obstructive sleep apnea)    ( 05-04-2021 pt stated has not used cpap since 05/ 2021) followed by dr byrum;  study in epic 02-16-2019 moderate , titrated cpap 08-28-2019   Plantar fasciitis, right    w/ chronic pain    Patient Active Problem List   Diagnosis Date Noted    Hip pain 12/08/2019   Hyperlipidemia associated with type 2 diabetes mellitus (HCC) 11/23/2019   History of COVID-19 10/27/2019   Stuttering 10/27/2019   Depression with anxiety 10/27/2019   Obesity, Class III, BMI 40-49.9 (morbid obesity) (HCC) 06/30/2019   OSA (obstructive sleep apnea) 02/16/2019   Essential hypertension 06/22/2018   Uncontrolled type 2 diabetes mellitus with complication, with long-term current use of insulin 12/13/2016   Asthma 07/30/2016   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/07/2016   DM neuropathy, type II diabetes mellitus (HCC) 08/24/2015    Past Surgical History:  Procedure Laterality Date   CESAREAN SECTION  1999   PLANTAR FASCIA RELEASE Right 05/08/2021   Procedure: ENDOSCOPIC PLANTAR FASCIOTOMY;  Surgeon: Asencion Islam, DPM;  Location: [facility];  Service: Podiatry;  Laterality: Right;  WITH BLOCK   STERIOD INJECTION Bilateral 05/08/2021   Procedure: PLATELET RICH PLASMA INJECTION HEELS;  Surgeon: Asencion Islam, DPM;  [facility];  Service: Podiatry;  Laterality: Bilateral;   TUBAL LIGATION Bilateral 09/29/2006   @[facility] ;   [facility]   VENTRAL HERNIA REPAIR N/A 11/27/2017   Procedure: VENTRAL HERNIA REPAIR ERAS PATHWAY;  Surgeon: 11/29/2017, MD;  Location: [facility];  Service: General;  Laterality: N/A;   WISDOM TOOTH EXTRACTION      OB History  Gravida  5   Para  4   Term      Preterm      AB  1   Living  4      SAB  1   IAB      Ectopic      Multiple      Live Births               Home Medications    Prior to Admission medications   Medication Sig Start Date End Date Taking? Authorizing Provider  atorvastatin (LIPITOR) 20 MG tablet Take 1 tablet (20 mg total) by mouth daily. 05/05/21     ibuprofen (ADVIL) 800 MG tablet Take 1 tablet (800 mg total) by mouth every 8 (eight) hours as needed. 07/13/21   Aamya Orellana, Britta MccreedyPhilip O, MD  insulin aspart (NOVOLOG FLEXPEN) 100 UNIT/ML FlexPen  Inject into the skin 3 (three) times daily before meals. ONLY WHEN INSULIN PUMP MALFUNCTION'S PER ENDOCRINOLOGY DIRECTION    [provider]  insulin aspart (NOVOLOG) 100 UNIT/ML injection USE VIA INSULIN PUMP. MAX TOTAL DAILY DOSE OF 300 UNITS Patient not taking: Reported on 06/14/2021 02/17/21     insulin aspart (NOVOLOG) 100 UNIT/ML injection USE VIA INSULIN PUMP. MAX TOTAL DAILY DOSE OF 300 UNITS 02/24/21     insulin glargine, 1 Unit Dial, (TOUJEO SOLOSTAR) 300 UNIT/ML Solostar Pen Administer 50 units once daily only if insulin pump malfunction Patient not taking: Reported on 05/04/2021 02/24/21     Insulin Human (INSULIN PUMP) SOLN Inject 1 each into the skin See admin instructions. Medication: Novolog 100 units/ml injection. Takes 100 units via pump per patient. Patient not taking: Reported on 06/14/2021    [provider]  Insulin Syringe-Needle U-100 31G X 5/16" 1 ML MISC inject 10 units into the skin 3 times daily. use to inject novolog based on insulin to card ratio- max daily dose 150 units Patient not taking: Reported on 06/14/2021 09/23/20     linaclotide (LINZESS) 72 MCG capsule Take 1 capsule (72 mcg total) by mouth daily before breakfast. Patient not taking: Reported on 06/14/2021 04/26/21   Hoy RegisterNewlin, Enobong, MD  lisinopril (ZESTRIL) 20 MG tablet Take 1 tablet (20 mg total) by mouth daily. 02/24/21     meloxicam (MOBIC) 7.5 MG tablet Take 1 tablet (7.5 mg total) by mouth 2 (two) times daily with a meal. 07/06/21   Asencion IslamStover, Titorya, DPM  Misc. Devices MISC CPAP therapy on autopap 5-15.  Needs Small size [employer]kel Full Face Mask Simplus  mask and heated humidification. Patient not taking: Reported on 06/14/2021 09/13/19   Claiborne RiggFleming, Zelda W, NP  ondansetron (ZOFRAN ODT) 8 MG disintegrating tablet Take 1 tablet (8 mg total) by mouth every 8 (eight) hours as needed for nausea or vomiting. Patient not taking: Reported on 06/14/2021 02/21/21   Rhys MartiniGraham, Laura E, PA-C  pregabalin  (LYRICA) 75 MG capsule Take 1 capsule (75 mg total) by mouth 2 (two) times daily. 05/18/21   Asencion IslamStover, Titorya, DPM  traMADol (ULTRAM) 50 MG tablet Take 1 tablet (50 mg total) by mouth every 6 (six) hours as needed. Patient not taking: Reported on 06/14/2021 05/08/21 05/08/22  Asencion IslamStover, Titorya, DPM  ARIPiprazole (ABILIFY) 5 MG tablet Take 1 tablet (5 mg total) by mouth daily. 10/27/19 06/13/20  Sater, Pearletha Furlichard A, MD    Family History Family History  Problem Relation Age of Onset   Asthma Mother    Kidney failure Mother    Brain cancer Mother    Asthma  Father    Other Father        surgery on stomach but don't know from what   Diabetes Brother    Diabetes Maternal Grandmother     Social History Social History   Tobacco Use   Smoking status: Former    Packs/day: 0.10    Years: 2.00    Pack years: 0.20    Types: Cigarettes    Quit date: 11/27/2017    Years since quitting: 3.6   Smokeless tobacco: Never  Vaping Use   Vaping Use: Some days   Devices: Elfvar  (05-04-2021 per pt last vaped approx 2 wks ago)  Substance Use Topics   Alcohol use: No   Drug use: Never     Allergies   Morphine and related, Glyburide, Hydrocodone, Ivp dye [iodinated contrast media], and Oxycodone   Review of Systems Review of Systems  HENT: Negative.    Respiratory: Negative.    Cardiovascular:  Negative for chest pain, palpitations and leg swelling.  Genitourinary: Negative.   Neurological: Negative.     Physical Exam Triage Vital Signs ED Triage Vitals  Enc Vitals Group     BP 07/13/21 2016 119/82     Pulse Rate 07/13/21 2016 (!) 114     Resp 07/13/21 2016 20     Temp 07/13/21 2016 98.4 F (36.9 C)     Temp Source 07/13/21 2016 Oral     SpO2 07/13/21 2016 97 %     Weight --      Height --      Head Circumference --      Peak Flow --      Pain Score 07/13/21 2020 0     Pain Loc --      Pain Edu? --      Excl. in GC? --    No data found.  Updated Vital Signs BP 119/82 (BP  Location: Right Arm)    Pulse (!) 114    Temp 98.4 F (36.9 C) (Oral)    Resp 20    SpO2 97%   Visual Acuity Right Eye Distance:   Left Eye Distance:   Bilateral Distance:    Right Eye Near:   Left Eye Near:    Bilateral Near:     Physical Exam Constitutional:      General: She is not in acute distress.    Appearance: Normal appearance. She is not ill-appearing.  Cardiovascular:     Rate and Rhythm: Regular rhythm. Tachycardia present.     Pulses: Normal pulses.     Heart sounds: Normal heart sounds. No murmur heard.   No friction rub.  Pulmonary:     Effort: Pulmonary effort is normal.     Breath sounds: Normal breath sounds. No wheezing, rhonchi or rales.  Abdominal:     General: Abdomen is flat. Bowel sounds are normal.     Tenderness: There is no abdominal tenderness.     Hernia: No hernia is present.  Musculoskeletal:     Comments: Tenderness swelling of the right heel.  No fluctuance.  No erythema.  Neurological:     Mental Status: She is alert.     UC Treatments / Results  Labs (all labs ordered are listed, but only abnormal results are displayed) Labs Reviewed - No data to display  EKG   Radiology No results found.  Procedures Procedures (including critical care time)  Medications Ordered in UC Medications  ketorolac (TORADOL) 30 MG/ML injection 30 mg (30 mg  Intramuscular Given 07/13/21 2105)    Initial Impression / Assessment and Plan / UC Course  I have reviewed the triage vital signs and the nursing notes.  Pertinent labs & imaging results that were available during my care of the patient were reviewed by me and considered in my medical decision making (see chart for details).     1.  Tachycardia, chronic: Previous medical records reviewed.  Patient's heart rate over the past several visits has been between 100-125.  Thyroid function test done a year ago were normal.  Patient denies weight loss or hot intolerance.  No signs of  hyperthyroidism EKG shows sinus tachycardia. Patient is advised to follow-up with cardiology for further management of chronic tachycardia. Final Clinical Impressions(s) / UC Diagnoses   Final diagnoses:  Tachycardia     Discharge Instructions      Please take ibuprofen as prescribed Continue with physical therapy. Please call the cardiologist office for an appointment for further evaluation of tachycardia.   ED Prescriptions     Medication Sig Dispense Auth. Provider   ibuprofen (ADVIL) 800 MG tablet Take 1 tablet (800 mg total) by mouth every 8 (eight) hours as needed. 30 tablet Fumio Vandam, Britta Mccreedy, MD      PDMP not reviewed this encounter.   Merrilee Jansky, MD 07/14/21 1242

## 2021-07-15 ENCOUNTER — Encounter: Payer: Medicaid Other | Admitting: Physical Therapy

## 2021-07-18 ENCOUNTER — Encounter: Payer: Self-pay | Admitting: Nurse Practitioner

## 2021-07-18 ENCOUNTER — Telehealth (HOSPITAL_BASED_OUTPATIENT_CLINIC_OR_DEPARTMENT_OTHER): Payer: Medicaid Other | Admitting: Nurse Practitioner

## 2021-07-18 DIAGNOSIS — J0100 Acute maxillary sinusitis, unspecified: Secondary | ICD-10-CM | POA: Diagnosis not present

## 2021-07-18 MED ORDER — AMOXICILLIN-POT CLAVULANATE 875-125 MG PO TABS: 1.0000 | ORAL_TABLET | Freq: Two times a day (BID) | ORAL | 0 refills | Status: AC

## 2021-07-18 MED ORDER — BENZONATATE 100 MG PO CAPS: 100.0000 mg | ORAL_CAPSULE | Freq: Three times a day (TID) | ORAL | 0 refills | Status: AC | PRN

## 2021-07-18 NOTE — Progress Notes (Signed)
Virtual Visit Note Due to national recommendations of social distancing due to [address], virtual visit is felt to be most appropriate for this patient at this time.  I discussed the limitations, risks, security and privacy concerns of performing an evaluation and management service by video and the availability of in person appointments. I also discussed with the patient that there may be a patient responsible charge related to this service. The patient expressed understanding and agreed to proceed.    I connected with Sarah Garrison on [id number]  at   2:50 PM EST  EDT by VIDEO and verified that I am speaking with the correct person using two identifiers.   Location of Patient: Private Residence   Location of Provider: [place] participating in VIRTUAL visit: Geryl Rankins FNP-BC [facility]    History of Present Illness: VIRTUAL visit for: Sinusitis Patient complains of congestion, cough described as productive of mucoid sputum, facial pain, headache described as pounding, nasal congestion, post nasal drip, and shortness of breath. Onset of symptoms was 6 days ago, gradually worsening since that time. She is drinking plenty of fluids.  Past history is significant for  COVID . Patient is former smoker and now vaping.      Past Medical History:  Diagnosis Date   Anxiety    Asthma    Chronic constipation    Chronic shortness of breath    Depression    GERD (gastroesophageal reflux disease)    History of COVID-19 09/29/2019   09-28-2020 result in epic; hospital admission 10-05-2019 for covid pneumonia with hypoxic acute respiratory failure, no oxygen needed when discharged 10-06-2019;  and positive 06-13-2020 result in epic, pt stated no symptoms   History of seizures    (05-04-2021  pt stated was told during hospital admission 05/ 2021 with covid , she had 2 seizures,  stated no medication and no seizure since)   Hypertension     Insulin dependent type 2 diabetes mellitus [facility])    endocrinologist-- Jacolyn Reedy NP;   pt uses insulin pump   Insulin pump in place    Mixed hyperlipidemia    OSA (obstructive sleep apnea)    ( 05-04-2021 pt stated has not used cpap since 05/ 2021) followed by dr byrum;  study in epic 02-16-2019 moderate , titrated cpap 08-28-2019   Plantar fasciitis, right    w/ chronic pain    Past Surgical History:  Procedure Laterality Date   [place] Right 05/08/2021   Procedure: ENDOSCOPIC PLANTAR FASCIOTOMY;  Surgeon: Landis Martins, DPM;  Location: [place];  Service: Podiatry;  Laterality: Right;  WITH BLOCK   STERIOD INJECTION Bilateral 05/08/2021   Procedure: PLATELET RICH PLASMA INJECTION HEELS;  Surgeon: Landis Martins, DPM;  Location: [place];  Service: Podiatry;  Laterality: Bilateral;   TUBAL LIGATION Bilateral 09/29/2006   @[facility] ;   [facility]   VENTRAL HERNIA REPAIR N/A 11/27/2017   Procedure: Halfway;  Surgeon: Erroll Luna, MD;  Location: [place];  Service: General;  Laterality: N/A;   WISDOM TOOTH EXTRACTION      Family History  Problem Relation Age of Onset   Asthma Mother    Kidney failure Mother    Brain cancer Mother    Asthma Father    Other Father        surgery on stomach but don't know from what  Diabetes Brother    Diabetes Maternal Grandmother     Social History   Socioeconomic History   Marital status: Single    Spouse name: Not on file   Number of children: 4   Years of education: Not on file   Highest education level: Not on file  Occupational History   Not on file  Tobacco Use   Smoking status: Former    Packs/day: 0.10    Years: 2.00    Pack years: 0.20    Types: Cigarettes    Quit date: 11/27/2017    Years since quitting: 3.6   Smokeless tobacco: Never  Vaping Use   Vaping Use: Some days   Devices: Elfvar  (05-04-2021 per pt  last vaped approx 2 wks ago)  Substance and Sexual Activity   Alcohol use: No   Drug use: Never   Sexual activity: Yes    Birth control/protection: Surgical, Injection  Other Topics Concern   Not on file  Social History Narrative   Right handed   No caffeine use   Diet coke sometimes    Social Determinants of Health   Financial Resource Strain: Not on file  Food Insecurity: Not on file  Transportation Needs: Not on file  Physical Activity: Not on file  Stress: Not on file  Social Connections: Not on file     Observations/Objective: Awake, alert and oriented x 3   Review of Systems  Constitutional:  Negative for fever, malaise/fatigue and weight loss.  HENT:  Positive for congestion and sinus pain. Negative for nosebleeds.   Eyes: Negative.  Negative for blurred vision, double vision and photophobia.  Respiratory:  Positive for cough, sputum production and shortness of breath. Negative for hemoptysis and wheezing.   Cardiovascular: Negative.  Negative for chest pain, palpitations and leg swelling.  Gastrointestinal: Negative.  Negative for heartburn, nausea and vomiting.  Musculoskeletal: Negative.  Negative for myalgias.  Neurological:  Positive for headaches. Negative for dizziness, focal weakness and seizures.  Psychiatric/Behavioral: Negative.  Negative for suicidal ideas.    Assessment and Plan: Diagnoses and all orders for this visit:  Acute non-recurrent maxillary sinusitis -     amoxicillin-clavulanate (AUGMENTIN) 875-125 MG tablet; Take 1 tablet by mouth 2 (two) times daily for 7 days. -     benzonatate (TESSALON) 100 MG capsule; Take 1-2 capsules (100-200 mg total) by mouth 3 (three) times daily as needed for up to 10 days for cough.     Follow Up Instructions No follow-ups on file.     I discussed the assessment and treatment plan with the patient. The patient was provided an opportunity to ask questions and all were answered. The patient agreed with the  plan and demonstrated an understanding of the instructions.   The patient was advised to call back or seek an in-person evaluation if the symptoms worsen or if the condition fails to improve as anticipated.  I provided 10 minutes of face-to-face time during this encounter including median intraservice time, reviewing previous notes, labs, imaging, medications and explaining diagnosis and management.  Gildardo Pounds, FNP-BC

## 2021-07-21 NOTE — Therapy (Signed)
OUTPATIENT PHYSICAL THERAPY TREATMENT NOTE/ Re-Authorization   Patient Name: Sarah Garrison MRN: [id number] DOB:August 24, 1980, 41 y.o., female Today's Date: 07/21/2021  PCP: Sarah Font, FNP REFERRING PROVIDER: Maggie Font, FNP      Past Medical History:  Diagnosis Date   Anxiety    Asthma    Chronic constipation    Chronic shortness of breath    Depression    GERD (gastroesophageal reflux disease)    History of COVID-19 09/29/2019   09-28-2020 result in epic; hospital admission 10-05-2019 for covid pneumonia with hypoxic acute respiratory failure, no oxygen needed when discharged 10-06-2019;  and positive 06-13-2020 result in epic, pt stated no symptoms   History of seizures    (05-04-2021  pt stated was told during hospital admission 05/ 2021 with covid , she had 2 seizures,  stated no medication and no seizure since)   Hypertension    Insulin dependent type 2 diabetes mellitus [facility])    endocrinologist-- Sarah Sorrow NP;   pt uses insulin pump   Insulin pump in place    Mixed hyperlipidemia    OSA (obstructive sleep apnea)    ( 05-04-2021 pt stated has not used cpap since 05/ 2021) followed by dr byrum;  study in epic 02-16-2019 moderate , titrated cpap 08-28-2019   Plantar fasciitis, right    w/ chronic pain   Past Surgical History:  Procedure Laterality Date   CESAREAN SECTION  1999   PLANTAR FASCIA RELEASE Right 05/08/2021   Procedure: ENDOSCOPIC PLANTAR FASCIOTOMY;  Surgeon: Sarah Garrison, DPM;  Location: [facility];  Service: Podiatry;  Laterality: Right;  WITH BLOCK   STERIOD INJECTION Bilateral 05/08/2021   Procedure: PLATELET RICH PLASMA INJECTION HEELS;  Surgeon: Sarah Garrison, DPM;  Location: [facility];  Service: Podiatry;  Laterality: Bilateral;   TUBAL LIGATION Bilateral 09/29/2006   @[facility] ;   [facility]   VENTRAL HERNIA REPAIR N/A 11/27/2017   Procedure: VENTRAL HERNIA REPAIR ERAS PATHWAY;  Surgeon: Sarah Bouillon, MD;   Location: [facility];  Service: General;  Laterality: N/A;   WISDOM TOOTH EXTRACTION     Patient Active Problem List   Diagnosis Date Noted   Hip pain 12/08/2019   Hyperlipidemia associated with type 2 diabetes mellitus (HCC) 11/23/2019   History of COVID-19 10/27/2019   Stuttering 10/27/2019   Depression with anxiety 10/27/2019   Obesity, Class III, BMI 40-49.9 (morbid obesity) (HCC) 06/30/2019   OSA (obstructive sleep apnea) 02/16/2019   Essential hypertension 06/22/2018   Uncontrolled type 2 diabetes mellitus with complication, with long-term current use of insulin 12/13/2016   Asthma 07/30/2016   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/07/2016   DM neuropathy, type II diabetes mellitus (HCC) 08/24/2015    REFERRING DIAG: Q76.226 (ICD-10-CM) - S/P foot surgery  THERAPY DIAG:  No diagnosis found.  PERTINENT HISTORY: Right ENDOSCOPIC PLANTAR FASCIOTOMY 05/08/2021  SUBJECTIVE:  Pt reports she has an appointment with cardiology on February 27th following her visit to the ED last week in regard to her chronic tachycardia. Other heart rhythm and blood pressure was WNL. She reports non-adherence to her HEP last week due to increased swelling and pain.  PAIN:  Are you having pain? Yes NPRS scale: 6/10 Pain location: Medial Rt arch PAIN TYPE: aching and tight Pain description: constant    OBJECTIVE:   *Unless otherwise noted, objective information collected previously*  DIAGNOSTIC FINDINGS: 05/27/2021 DG Foot Complete Right: IMPRESSION: No acute fracture or dislocation.     COGNITION:  Overall cognitive status: Within functional limits for tasks assessed                        SENSATION:          Light touch: Appears intact             MUSCLE LENGTH: Gastrocnemius: Severely limited on Lt, not tested on Rt due to heightened pain Soleus: Severely limited on Lt, not tested on Rt due to heightened pain   POSTURE:  WNL   PALPATION: Exquisite TTP to plantar heel  and medial arch on Rt 07/04/21 significant palpable tenderness Rt heel; no warmth noted    LE AROM/PROM:   AROM/PROM Right 06/14/2021 Left 06/14/2021 Right 07/22/2021  Ankle dorsiflexion -25 0 -18/ -12p!  Ankle plantarflexion 38 45 38  Ankle inversion 8 35 10/15p!  Ankle eversion 8 10 -3/0p!   (Blank rows = not tested)   LE MMT:   MMT Right 06/14/2021 Left 06/14/2021 Right 07/22/2021  Ankle dorsiflexion 2+/5p! 5/5 3/5p!  Ankle plantarflexion 2+/5p! 5/5 2+/5p!  Ankle inversion 2+/5p! 5/5 2+/5p!  Ankle eversion 2+/5p! 5/5 3/5p!   (Blank rows = not tested)     FUNCTIONAL TESTS:  Deferred due to high pain state and difficulty with standing   GAIT: Distance walked: 71ft Assistive device utilized: None Level of assistance: Complete Independence Comments: Pt walked with antalgic gait, weight bearing through her Rt lateral metatarsals (no heel strike)  07/04/21: maintains plantarflexion during stance on the RLE  OBSERVATION: 07/04/21 mild redness and swelling about the heel    TODAY'S TREATMENT:  [facility] Adult PT Treatment:                                                DATE: 07/22/2021 Therapeutic Exercise: Long-sitting plantar fascia stretch with sheet on Rt x2 minutes Seated great toe extension mobilization with rolled towel Seated Rt ankle eversion towel scrunch x5 Seated Rt ankle inversion towel scrunch x5 Manual Therapy: Gradually increased light touch to Rt medial arch for desensitization Neuromuscular re-ed: N/A Therapeutic Activity: N/A Modalities: Ice pack to Rt foot in sitting; no adverse effects noted Self Care: N/A   [facility] Adult PT Treatment:                                                DATE: 07/13/21 Therapeutic Exercise:  nu-step L5 68m while taking subjective and planning session with patient Bike attempted, but not tolerated  Therapeutic Activity - after finishing warm up I took dorsalis pedis pulse and it felt like patient was tachycardic.  Using a pulse  ox, HR was measured variably between 115 and 126 after extended rest.  I discussed at length that this was not a normal pulse rate with Sarah Garrison and recommended that she go the urgent care or the ED to rule out a serious cause; she agrees to plan.   [facility] Adult PT Treatment:                                                DATE: 07/04/21 Therapeutic  Exercise: Digit flexion/extension ROM 2 x 10  Toe raises 2 x 10 seated (maintains Rt foot in supination, unable to achieve neutral due to pain) Heel raises 2 x 10 seated (maintains Rt foot in supination, unable to achieve neutral due to pain) Seated ankle inversion and eversion AROM in seated (maintains Rt foot in supination, unable to achieve neutral due to pain) Seated marble pickups 3 x 5  Seated rockerboard 2 x 10 therapist assist to maintain knee alignment Ankle pumps 2 x 10  Standing weight shifts lateral 1 x 5; unable to achieve foot flat on the RLE secondary to pain          PATIENT EDUCATION:  Education details: Updated HEP, educated on importance of daily desensitization techniques Person educated: Patient Education method: Explanation Education comprehension: verbalized understanding      HOME EXERCISE PROGRAM: Access Code: [id number]   Exercises Seated Calf Stretch with Strap - 1 x daily - 7 x weekly - 2 sets - 1-min hold Seated Toe Raise - 1 x daily - 7 x weekly - 3 sets - 10 reps Seated Heel Raise - 1 x daily - 7 x weekly - 3 sets - 10 reps Seated Ankle Inversion Eversion AROM - 1 x daily - 7 x weekly - 3 sets - 10 reps Seated Ankle Inversion AROM - 1 x daily - 7 x weekly - 3 sets - 10 reps  07/22/2021: Ankle Inversion Eversion Towel Slide - 1 x daily - 7 x weekly - 2 sets - 5 reps Big toe mobilization - 1 x daily - 7 x weekly - 3 sets - 30sec hold Long Sitting Plantar Fascia Stretch with Towel - 1 x daily - 7 x weekly - 3 sets - 30sec hold      ASSESSMENT:   CLINICAL IMPRESSION: Pt continues to exhibit high levels of pain  and allodynia, bordering on chronic regional pain syndrome symptoms. Among these symptoms include trophic changes to the skin about her Rt foot, which is grayish in appearance and swollen. Treatment today focused primarily on desensitization techniques, with a focus on manual graded exposure to pressure to the Rt heel and medial arch. The pt had heightened pain throughout the session, which limited the amount of therapeutic exercise which was possible. The session ended with cryotherapy to treat her swelling, although this also proved painful to the pt. She leaves with 7/10 pain today, up from 6/10 when she arrived. Upon re-assessment of ankle ROM and strength, the pt remains limited. She has made menial improvements in ankle dorsiflexion AROM. She will continue to benefit from skilled PT to address her primary impairments and return to her prior level of function with less limitation.   REHAB POTENTIAL: Good   CLINICAL DECISION MAKING: Stable/uncomplicated   EVALUATION COMPLEXITY: Low     GOALS: Goals reviewed with patient? Yes   SHORT TERM GOALS:   STG Name Target Date Goal status  1 Pt will report understanding and adherence to her HEP in order to promote independence in the management of her primary impairments. Baseline: HEP provided at eval 07/22/2021: Pt reports non-adherence due to pain 07/12/2021 Ongoing - pt limited d/t pain    LONG TERM GOALS:    LTG Name Target Date Goal status  1 Pt will achieve global Rt ankle strength of 4/5 or higher in order to independently progress her LE strengthening regimen without limitation. Baseline: globally 2+/5 07/22/2021: See upodated MMT chart 08/09/2021 Ongoing  2 Pt will achieve BIL  ankle DF AROM of 5 degrees in order to promote WNL gait pattern. Baseline: See AROM chart 07/22/2021: See updated AROM chart 08/09/2021 Ongoing  3 Pt will report ability to walk >20 minutes without the need of a seated rest break with 0-2/10 pain in order to grocery shop  with less limitation. Baseline: Pt unable to stand/walk >5-10 minutes due to severe pain 07/22/2021: Pt unable to stand/walk >5-10 minutes due to severe pain 08/09/2021 Ongoing  4 Pt will perform 5 full-weight-bearing squats with 0-2/10 pain in order to lift groceries from the floor without limitation. Baseline: Unable to perform a squat due to pain. 08/09/2021 INITIAL    PLAN: PT FREQUENCY: 2x/week   PT DURATION: 8 weeks   PLANNED INTERVENTIONS: Therapeutic exercises, Therapeutic activity, Neuro Muscular re-education, Balance training, Gait training, Patient/Family education, Joint mobilization, Stair training, Dry Needling, Spinal mobilization, Cryotherapy, Moist heat, Taping, and Manual therapy   PLAN FOR NEXT SESSION: PT on hold until cleared by MD for abnormal heart rate   Vanessa Blair, PT, DPT 07/21/21 2:00 PM

## 2021-07-22 ENCOUNTER — Other Ambulatory Visit: Payer: Self-pay

## 2021-07-22 ENCOUNTER — Ambulatory Visit: Payer: Medicaid Other

## 2021-07-22 DIAGNOSIS — M79671 Pain in right foot: Secondary | ICD-10-CM

## 2021-07-22 DIAGNOSIS — R262 Difficulty in walking, not elsewhere classified: Secondary | ICD-10-CM

## 2021-07-22 DIAGNOSIS — M6281 Muscle weakness (generalized): Secondary | ICD-10-CM

## 2021-07-31 ENCOUNTER — Ambulatory Visit: Payer: Medicaid Other | Admitting: Cardiology

## 2021-07-31 ENCOUNTER — Other Ambulatory Visit: Payer: Self-pay

## 2021-07-31 ENCOUNTER — Encounter: Payer: Self-pay | Admitting: Cardiology

## 2021-07-31 ENCOUNTER — Ambulatory Visit (INDEPENDENT_AMBULATORY_CARE_PROVIDER_SITE_OTHER): Payer: Medicaid Other

## 2021-07-31 VITALS — BP 120/96 | HR 114 | Ht 64.0 in | Wt 260.4 lb

## 2021-07-31 DIAGNOSIS — E785 Hyperlipidemia, unspecified: Secondary | ICD-10-CM

## 2021-07-31 DIAGNOSIS — R002 Palpitations: Secondary | ICD-10-CM | POA: Diagnosis not present

## 2021-07-31 DIAGNOSIS — E1100 Type 2 diabetes mellitus with hyperosmolarity without nonketotic hyperglycemic-hyperosmolar coma (NKHHC): Secondary | ICD-10-CM

## 2021-07-31 DIAGNOSIS — G4733 Obstructive sleep apnea (adult) (pediatric): Secondary | ICD-10-CM | POA: Diagnosis not present

## 2021-07-31 DIAGNOSIS — R5383 Other fatigue: Secondary | ICD-10-CM

## 2021-07-31 DIAGNOSIS — I10 Essential (primary) hypertension: Secondary | ICD-10-CM | POA: Insufficient documentation

## 2021-07-31 DIAGNOSIS — E1169 Type 2 diabetes mellitus with other specified complication: Secondary | ICD-10-CM

## 2021-07-31 DIAGNOSIS — Z794 Long term (current) use of insulin: Secondary | ICD-10-CM

## 2021-07-31 NOTE — Patient Instructions (Addendum)
Medication Instructions:  Your physician recommends that you continue on your current medications as directed. Please refer to the Current Medication list given to you today.  *If you need a refill on your cardiac medications before your next appointment, please call your pharmacy*   Lab Work: Your physician recommends that you return for lab work in:  TODAY: TSH, Vitamin B12, Vitamin D If you have labs (blood work) drawn today and your tests are completely normal, you will receive your results only by: MyChart Message (if you have MyChart) OR A paper copy in the mail If you have any lab test that is abnormal or we need to change your treatment, we will call you to review the results.   Testing/Procedures: Christena Deem- Long Term Monitor Instructions  Your physician has requested you wear a ZIO patch monitor for 14 days.  This is a single patch monitor. [employer] supplies one patch monitor per enrollment. Additional stickers are not available. Please do not apply patch if you will be having a Nuclear Stress Test,  Echocardiogram, Cardiac CT, MRI, or Chest Xray during the period you would be wearing the  monitor. The patch cannot be worn during these tests. You cannot remove and re-apply the  ZIO XT patch monitor.  Your ZIO patch monitor will be mailed 3 day USPS to your address on file. It may take 3-5 days  to receive your monitor after you have been enrolled.  Once you have received your monitor, please review the enclosed instructions. Your monitor  has already been registered assigning a specific monitor serial # to you.  Billing and Patient Assistance Program Information  We have supplied [employer] with any of your insurance information on file for billing purposes. [employer] offers a sliding scale Patient Assistance Program for patients that do not have  insurance, or whose insurance does not completely cover the cost of the ZIO monitor.  You must apply for the Patient Assistance Program  to qualify for this discounted rate.  To apply, please call [employer] at [phone], select option 4, select option 2, ask to apply for  Patient Assistance Program. Meredeth Ide will ask your household income, and how many people  are in your household. They will quote your out-of-pocket cost based on that information.  [employer] will also be able to set up a 74-month, interest-free payment plan if needed.  Applying the monitor   Shave hair from upper left chest.  Hold abrader disc by orange tab. Rub abrader in 40 strokes over the upper left chest as  indicated in your monitor instructions.  Clean area with 4 enclosed alcohol pads. Let dry.  Apply patch as indicated in monitor instructions. Patch will be placed under collarbone on left  side of chest with arrow pointing upward.  Rub patch adhesive wings for 2 minutes. Remove white label marked "1". Remove the white  label marked "2". Rub patch adhesive wings for 2 additional minutes.  While looking in a mirror, press and release button in center of patch. A small green light will  flash 3-4 times. This will be your only indicator that the monitor has been turned on.  Do not shower for the first 24 hours. You may shower after the first 24 hours.  Press the button if you feel a symptom. You will hear a small click. Record Date, Time and  Symptom in the Patient Logbook.  When you are ready to remove the patch, follow instructions on the last 2 pages of Patient  Logbook. Stick patch monitor onto the last page of Patient Logbook.  Place Patient Logbook in the blue and white box. Use locking tab on box and tape box closed  securely. The blue and white box has prepaid postage on it. Please place it in the mailbox as  soon as possible. Your physician should have your test results approximately 7 days after the  monitor has been mailed back to [facility].  Call  [facility] at [phone] if you have questions regarding  your ZIO XT  patch monitor. Call them immediately if you see an orange light blinking on your  monitor.  If your monitor falls off in less than 4 days, contact our Monitor department at [phone].  If your monitor becomes loose or falls off after 4 days call [employer] at [phone] for  suggestions on securing your monitor    Follow-Up: At [facility], you and your health needs are our priority.  As part of our continuing mission to provide you with exceptional heart care, we have created designated Provider Care Teams.  These Care Teams include your primary Cardiologist (physician) and Advanced Practice Providers (APPs -  Physician Assistants and Nurse Practitioners) who all work together to provide you with the care you need, when you need it.  We recommend signing up for the patient portal called "MyChart".  Sign up information is provided on this After Visit Summary.  MyChart is used to connect with patients for Virtual Visits (Telemedicine).  Patients are able to view lab/test results, encounter notes, upcoming appointments, etc.  Non-urgent messages can be sent to your provider as well.   To learn more about what you can do with MyChart, go to [email or web address].    Your next appointment:   1 year(s)  The format for your next appointment:   In Person  Provider:   Thomasene Ripple, DO     Other Instructions

## 2021-07-31 NOTE — Progress Notes (Signed)
Cardiology Office Note:    Date:  [id number]   ID:  [name], [name] November 10, 1980, MRN [id number]  PCP:  Darrold Junker, FNP  Cardiologist:  Berniece Salines, DO  Electrophysiologist:  None   Referring MD: Darrold Junker, FNP   " I am having heart palpitations"   History of Present Illness:    Sarah Garrison is a 41 y.o. female with a hx of diabetes mellitus type 2, hyperlipidemia, morbid obesity, hypertension is here today to establish cardiac care as well as be evaluated for palpitations.  The patient tells me that she developed COVID back in April 2021 ever since that time she has had increasing heartbeats.  She notes that she had periods where her heart rate abruptly started and goals fast for a while before resolution.  She is concerned about this because at first the palpitations were occasional and now is frequent.  She has not had any syncope episode.   Past Medical History:  Diagnosis Date   Anxiety    Asthma    Chronic constipation    Chronic shortness of breath    Depression    GERD (gastroesophageal reflux disease)    History of COVID-19 09/29/2019   09-28-2020 result in epic; hospital admission 10-05-2019 for covid pneumonia with hypoxic acute respiratory failure, no oxygen needed when discharged 10-06-2019;  and positive 06-13-2020 result in epic, pt stated no symptoms   History of seizures    (05-04-2021  pt stated was told during hospital admission 05/ 2021 with covid , she had 2 seizures,  stated no medication and no seizure since)   Hypertension    Insulin dependent type 2 diabetes mellitus [facility])    endocrinologist-- Jacolyn Reedy NP;   pt uses insulin pump   Insulin pump in place    Mixed hyperlipidemia    OSA (obstructive sleep apnea)    ( 05-04-2021 pt stated has not used cpap since 05/ 2021) followed by dr byrum;  study in epic 02-16-2019 moderate , titrated cpap 08-28-2019   Plantar fasciitis, right    w/ chronic pain    Past Surgical History:   Procedure Laterality Date   [place] Right 05/08/2021   Procedure: ENDOSCOPIC PLANTAR FASCIOTOMY;  Surgeon: Landis Martins, DPM;  Location: [place];  Service: Podiatry;  Laterality: Right;  WITH BLOCK   STERIOD INJECTION Bilateral 05/08/2021   Procedure: PLATELET RICH PLASMA INJECTION HEELS;  Surgeon: Landis Martins, DPM;  Location: [place];  Service: Podiatry;  Laterality: Bilateral;   TUBAL LIGATION Bilateral 09/29/2006   @[facility] ;   [facility]   VENTRAL HERNIA REPAIR N/A 11/27/2017   Procedure: VENTRAL HERNIA REPAIR ERAS PATHWAY;  Surgeon: Erroll Luna, MD;  Location: [place];  Service: General;  Laterality: N/A;   WISDOM TOOTH EXTRACTION      Current Medications: Current Meds  Medication Sig   atorvastatin (LIPITOR) 20 MG tablet Take 1 tablet (20 mg total) by mouth daily.   ibuprofen (ADVIL) 800 MG tablet Take 1 tablet (800 mg total) by mouth every 8 (eight) hours as needed.   insulin aspart (NOVOLOG) 100 UNIT/ML injection USE VIA INSULIN PUMP. MAX TOTAL DAILY DOSE OF 300 UNITS   lisinopril (ZESTRIL) 20 MG tablet Take 1 tablet (20 mg total) by mouth daily.   ondansetron (ZOFRAN ODT) 8 MG disintegrating tablet Take 1 tablet (8 mg total) by mouth every 8 (eight) hours as needed for nausea or  vomiting.   pregabalin (LYRICA) 75 MG capsule Take 1 capsule (75 mg total) by mouth 2 (two) times daily.     Allergies:   Morphine and related, Glyburide, Hydrocodone, Ivp dye [iodinated contrast media], and Oxycodone   Social History   Socioeconomic History   Marital status: Single    Spouse name: Not on file   Number of children: 4   Years of education: Not on file   Highest education level: Not on file  Occupational History   Not on file  Tobacco Use   Smoking status: Former    Packs/day: 0.10    Years: 2.00    Pack years: 0.20    Types: Cigarettes    Quit date: 11/27/2017    Years since  quitting: 3.6   Smokeless tobacco: Never  Vaping Use   Vaping Use: Some days   Devices: Elfvar  (05-04-2021 per pt last vaped approx 2 wks ago)  Substance and Sexual Activity   Alcohol use: No   Drug use: Never   Sexual activity: Yes    Birth control/protection: Surgical, Injection  Other Topics Concern   Not on file  Social History Narrative   Right handed   No caffeine use   Diet coke sometimes    Social Determinants of Health   Financial Resource Strain: Not on file  Food Insecurity: Not on file  Transportation Needs: Not on file  Physical Activity: Not on file  Stress: Not on file  Social Connections: Not on file     Family History: The patient's family history includes Asthma in her father and mother; Brain cancer in her mother; Diabetes in her brother and maternal grandmother; Kidney failure in her mother; Other in her father.  ROS:   Review of Systems  Constitution: Negative for decreased appetite, fever and weight gain.  HENT: Negative for congestion, ear discharge, hoarse voice and sore throat.   Eyes: Negative for discharge, redness, vision loss in right eye and visual halos.  Cardiovascular: Negative for chest pain, dyspnea on exertion, leg swelling, orthopnea and palpitations.  Respiratory: Negative for cough, hemoptysis, shortness of breath and snoring.   Endocrine: Negative for heat intolerance and polyphagia.  Hematologic/Lymphatic: Negative for bleeding problem. Does not bruise/bleed easily.  Skin: Negative for flushing, nail changes, rash and suspicious lesions.  Musculoskeletal: Negative for arthritis, joint pain, muscle cramps, myalgias, neck pain and stiffness.  Gastrointestinal: Negative for abdominal pain, bowel incontinence, diarrhea and excessive appetite.  Genitourinary: Negative for decreased libido, genital sores and incomplete emptying.  Neurological: Negative for brief paralysis, focal weakness, headaches and loss of balance.   Psychiatric/Behavioral: Negative for altered mental status, depression and suicidal ideas.  Allergic/Immunologic: Negative for HIV exposure and persistent infections.    EKGs/Labs/Other Studies Reviewed:    The following studies were reviewed today:   EKG:  The ekg ordered today demonstrates sinus tachycardia, heart rate 114 bpm  Recent Labs: 10/26/2020: ALT 20 05/27/2021: BUN 9; Creatinine, Ser 0.78; Hemoglobin 13.7; Platelets 320; Potassium 3.9; Sodium 137  Recent Lipid Panel    Component Value Date/Time   CHOL 244 (H) 10/26/2020 0908   TRIG 85 10/26/2020 0908   HDL 50 10/26/2020 0908   CHOLHDL 4.9 (H) 10/26/2020 0908   CHOLHDL 6 03/03/2018 1419   VLDL 37.4 03/03/2018 1419   LDLCALC 179 (H) 10/26/2020 0908    Physical Exam:    VS:  BP (!) 120/96    Pulse (!) 114    Ht 5\' 4"  (1.626 m)  Wt 260 lb 6.4 oz (118.1 kg)    SpO2 95%    BMI 44.70 kg/m     Wt Readings from Last 3 Encounters:  07/31/21 260 lb 6.4 oz (118.1 kg)  05/08/21 263 lb (119.3 kg)  04/26/21 261 lb (118.4 kg)     GEN: Well nourished, well developed in no acute distress HEENT: Normal NECK: No JVD; No carotid bruits LYMPHATICS: No lymphadenopathy CARDIAC: S1S2 noted,RRR, no murmurs, rubs, gallops RESPIRATORY:  Clear to auscultation without rales, wheezing or rhonchi  ABDOMEN: Soft, non-tender, non-distended, +bowel sounds, no guarding. EXTREMITIES: No edema, No cyanosis, no clubbing MUSCULOSKELETAL:  No deformity  SKIN: Warm and dry NEUROLOGIC:  Alert and oriented x 3, non-focal PSYCHIATRIC:  Normal affect, good insight  ASSESSMENT:    1. Palpitations   2. OSA (obstructive sleep apnea)   3. Fatigue, unspecified type   4. Type 2 diabetes mellitus with hyperosmolarity without coma, with long-term current use of insulin ([place])   5. Hyperlipidemia associated with type 2 diabetes mellitus ([place])   6. Primary hypertension    PLAN:     1.  Palpitations-I would like to rule out a cardiovascular  etiology of this palpitation, therefore at this time I would like to placed a zio patch for 14  days.   2. Fatigue -this is also likely due to her untreated sleep apnea but we will get TSH, vitamin D and B12 levels.  3. OSA -she currently is not using her CPAP.  She is on a wait list.  4.  Diabetes mellitus-this is being managed by his primary care doctor.  No adjustments for antidiabetic medications were made today.  5.  Morbid obesity-the patient understands the need to lose weight with diet and exercise. We have discussed specific strategies for this.  6.  Hyperlipidemia - continue with current statin medication.  7.  Hypertension-blood pressure is acceptable, continue with current antihypertensive regimen.  The patient is in agreement with the above plan. The patient left the office in stable condition.  The patient will follow up in   Medication Adjustments/Labs and Tests Ordered: Current medicines are reviewed at length with the patient today.  Concerns regarding medicines are outlined above.  Orders Placed This Encounter  Procedures   TSH+T4F+T3Free   B12   VITAMIN D 25 Hydroxy (Vit-D Deficiency, Fractures)   LONG TERM MONITOR (3-14 DAYS)   EKG 12-Lead   No orders of the defined types were placed in this encounter.   Patient Instructions  Medication Instructions:  Your physician recommends that you continue on your current medications as directed. Please refer to the Current Medication list given to you today.  *If you need a refill on your cardiac medications before your next appointment, please call your pharmacy*   Lab Work: Your physician recommends that you return for lab work in:  TODAY: TSH, Vitamin B12, Vitamin D If you have labs (blood work) drawn today and your tests are completely normal, you will receive your results only by: [place] (if you have MyChart) OR A paper copy in the mail If you have any lab test that is abnormal or we need to change  your treatment, we will call you to review the results.   Testing/Procedures: [place]- Long Term Monitor Instructions  Your physician has requested you wear a ZIO patch monitor for 14 days.  This is a single patch monitor. [employer] supplies one patch monitor per enrollment. Additional stickers are not available. Please do not apply patch  if you will be having a Nuclear Stress Test,  Echocardiogram, Cardiac CT, MRI, or Chest Xray during the period you would be wearing the  monitor. The patch cannot be worn during these tests. You cannot remove and re-apply the  ZIO XT patch monitor.  Your ZIO patch monitor will be mailed 3 day USPS to your address on file. It may take 3-5 days  to receive your monitor after you have been enrolled.  Once you have received your monitor, please review the enclosed instructions. Your monitor  has already been registered assigning a specific monitor serial # to you.  Billing and Patient Assistance Program Information  We have supplied [employer] with any of your insurance information on file for billing purposes. [employer] offers a sliding scale Patient Assistance Program for patients that do not have  insurance, or whose insurance does not completely cover the cost of the ZIO monitor.  You must apply for the Patient Assistance Program to qualify for this discounted rate.  To apply, please call [employer] at [phone], select option 4, select option 2, ask to apply for  Patient Assistance Program. Theodore Demark will ask your household income, and how many people  are in your household. They will quote your out-of-pocket cost based on that information.  [employer] will also be able to set up a 53-month, interest-free payment plan if needed.  Applying the monitor   Shave hair from upper left chest.  Hold abrader disc by orange tab. Rub abrader in 40 strokes over the upper left chest as  indicated in your monitor instructions.  Clean area with 4 enclosed alcohol pads. Let  dry.  Apply patch as indicated in monitor instructions. Patch will be placed under collarbone on left  side of chest with arrow pointing upward.  Rub patch adhesive wings for 2 minutes. Remove white label marked "1". Remove the white  label marked "2". Rub patch adhesive wings for 2 additional minutes.  While looking in a mirror, press and release button in center of patch. A small green light will  flash 3-4 times. This will be your only indicator that the monitor has been turned on.  Do not shower for the first 24 hours. You may shower after the first 24 hours.  Press the button if you feel a symptom. You will hear a small click. Record Date, Time and  Symptom in the Patient Logbook.  When you are ready to remove the patch, follow instructions on the last 2 pages of Patient  Logbook. Stick patch monitor onto the last page of Patient Logbook.  Place Patient Logbook in the blue and white box. Use locking tab on box and tape box closed  securely. The blue and white box has prepaid postage on it. Please place it in the mailbox as  soon as possible. Your physician should have your test results approximately 7 days after the  monitor has been mailed back to [facility].  Call [place] at [phone] if you have questions regarding  your ZIO XT patch monitor. Call them immediately if you see an orange light blinking on your  monitor.  If your monitor falls off in less than 4 days, contact our Monitor department at [phone].  If your monitor becomes loose or falls off after 4 days call [employer] at [phone] for  suggestions on securing your monitor    Follow-Up: At [facility], you and your health needs are our priority.  As part of our continuing mission to provide  you with exceptional heart care, we have created designated Provider Care Teams.  These Care Teams include your primary Cardiologist (physician) and Advanced Practice Providers (APPs -  Physician  Assistants and Nurse Practitioners) who all work together to provide you with the care you need, when you need it.  We recommend signing up for the patient portal called "MyChart".  Sign up information is provided on this After Visit Summary.  MyChart is used to connect with patients for Virtual Visits (Telemedicine).  Patients are able to view lab/test results, encounter notes, upcoming appointments, etc.  Non-urgent messages can be sent to your provider as well.   To learn more about what you can do with MyChart, go to [email or web address].    Your next appointment:   1 year(s)  The format for your next appointment:   In Person  Provider:   Berniece Salines, DO     Other Instructions     Adopting a Healthy Lifestyle.  Know what a healthy weight is for you (roughly BMI <25) and aim to maintain this   Aim for 7+ servings of fruits and vegetables daily   65-80+ fluid ounces of water or unsweet tea for healthy kidneys   Limit to max 1 drink of alcohol per day; avoid smoking/tobacco   Limit animal fats in diet for cholesterol and heart health - choose grass fed whenever available   Avoid highly processed foods, and foods high in saturated/trans fats   Aim for low stress - take time to unwind and care for your mental health   Aim for 150 min of moderate intensity exercise weekly for heart health, and weights twice weekly for bone health   Aim for 7-9 hours of sleep daily   When it comes to diets, agreement about the perfect plan isnt easy to find, even among the experts. Experts at the [employer] developed an idea known as the Healthy Eating Plate. Just imagine a plate divided into logical, healthy portions.   The emphasis is on diet quality:   Load up on vegetables and fruits - one-half of your plate: Aim for color and variety, and remember that potatoes dont count.   Go for whole grains - one-quarter of your plate: Whole wheat, barley, wheat berries,  quinoa, oats, brown rice, and foods made with them. If you want pasta, go with whole wheat pasta.   Protein power - one-quarter of your plate: Fish, chicken, beans, and nuts are all healthy, versatile protein sources. Limit red meat.   The diet, however, does go beyond the plate, offering a few other suggestions.   Use healthy plant oils, such as olive, canola, soy, corn, sunflower and peanut. Check the labels, and avoid partially hydrogenated oil, which have unhealthy trans fats.   If youre thirsty, drink water. Coffee and tea are good in moderation, but skip sugary drinks and limit milk and dairy products to one or two daily servings.   The type of carbohydrate in the diet is more important than the amount. Some sources of carbohydrates, such as vegetables, fruits, whole grains, and beans-are healthier than others.   Finally, stay active  Signed, Berniece Salines, DO  07/31/2021 4:33 PM    [facility]

## 2021-07-31 NOTE — Progress Notes (Unsigned)
Enrolled for [employer] to mail a ZIO XT long term holter monitor to the patients address on file.  

## 2021-08-01 ENCOUNTER — Other Ambulatory Visit: Payer: Self-pay

## 2021-08-01 LAB — VITAMIN B12: Vitamin B-12: 769 pg/mL (ref 232–1245)

## 2021-08-01 LAB — TSH+T4F+T3FREE
Free T4: 1.04 ng/dL (ref 0.82–1.77)
T3, Free: 2.5 pg/mL (ref 2.0–4.4)
TSH: 0.905 u[IU]/mL (ref 0.450–4.500)

## 2021-08-01 LAB — VITAMIN D 25 HYDROXY (VIT D DEFICIENCY, FRACTURES): Vit D, 25-Hydroxy: 10.8 ng/mL — ABNORMAL LOW (ref 30.0–100.0)

## 2021-08-01 MED ORDER — VITAMIN D (ERGOCALCIFEROL) 1.25 MG (50000 UNIT) PO CAPS
50000.0000 [IU] | ORAL_CAPSULE | ORAL | 0 refills | Status: DC
  Filled 2021-08-01 – 2021-12-13 (×2): qty 12, 84d supply, fill #0

## 2021-08-01 NOTE — Progress Notes (Signed)
Results/message from Dr. Servando Salina has been released to MyChart. It has been viewed by patient at this time. Prescription sent to pharmacy.

## 2021-08-03 ENCOUNTER — Ambulatory Visit (INDEPENDENT_AMBULATORY_CARE_PROVIDER_SITE_OTHER): Payer: Medicaid Other | Admitting: Sports Medicine

## 2021-08-03 ENCOUNTER — Encounter: Payer: Self-pay | Admitting: Sports Medicine

## 2021-08-03 ENCOUNTER — Other Ambulatory Visit: Payer: Self-pay

## 2021-08-03 DIAGNOSIS — Z794 Long term (current) use of insulin: Secondary | ICD-10-CM

## 2021-08-03 DIAGNOSIS — Z9889 Other specified postprocedural states: Secondary | ICD-10-CM

## 2021-08-03 DIAGNOSIS — M722 Plantar fascial fibromatosis: Secondary | ICD-10-CM

## 2021-08-03 DIAGNOSIS — E1140 Type 2 diabetes mellitus with diabetic neuropathy, unspecified: Secondary | ICD-10-CM

## 2021-08-03 DIAGNOSIS — G90521 Complex regional pain syndrome I of right lower limb: Secondary | ICD-10-CM

## 2021-08-03 DIAGNOSIS — M79671 Pain in right foot: Secondary | ICD-10-CM

## 2021-08-03 MED ORDER — DICLOFENAC EPOLAMINE 1.3 % EX PTCH
1.0000 | MEDICATED_PATCH | Freq: Two times a day (BID) | CUTANEOUS | 1 refills | Status: DC
Start: 2021-08-03 — End: 2022-08-17

## 2021-08-03 MED ORDER — MELOXICAM 7.5 MG PO TABS: 7.5000 mg | ORAL_TABLET | Freq: Two times a day (BID) | ORAL | 0 refills | Status: DC

## 2021-08-03 MED ORDER — CYCLOBENZAPRINE HCL 10 MG PO TABS: 10.0000 mg | ORAL_TABLET | Freq: Every day | ORAL | 0 refills | Status: DC

## 2021-08-03 MED ORDER — ACETAMINOPHEN 500 MG PO TABS: 500.0000 mg | ORAL_TABLET | Freq: Four times a day (QID) | ORAL | 0 refills | Status: DC | PRN

## 2021-08-03 MED ORDER — PREGABALIN 75 MG PO CAPS: 75.0000 mg | ORAL_CAPSULE | Freq: Two times a day (BID) | ORAL | 1 refills | Status: DC

## 2021-08-03 NOTE — Progress Notes (Addendum)
Subjective: ?Sarah Garrison is a 41 y.o. female patient seen today in office for POV # 5 (DOS 05-08-21), S/P Right EPF and bil heel PRP injection. Patient reports that she still has pretty severe pain pain on average is 8 out of 10 throbbing in nature cannot put her weight or pressure on the heel states that she does not like going to physical therapy because they try to make her current weight of pressure in the heel and she has not been in the last 2 weeks.  Admits that she had an episode of increased heart rate and is now seeing cardiology as well. ? ?Fasting blood sugar 177 ? ?Patient Active Problem List  ? Diagnosis Date Noted  ? Palpitations 07/31/2021  ? Fatigue 07/31/2021  ? Primary hypertension 07/31/2021  ? Hip pain 12/08/2019  ? Hyperlipidemia associated with type 2 diabetes mellitus (HCC) 11/23/2019  ? History of COVID-19 10/27/2019  ? Stuttering 10/27/2019  ? Depression with anxiety 10/27/2019  ? Obesity, Class III, BMI 40-49.9 (morbid obesity) (HCC) 06/30/2019  ? OSA (obstructive sleep apnea) 02/16/2019  ? Essential hypertension 06/22/2018  ? Type 2 diabetes mellitus with hyperosmolarity without coma, with long-term current use of insulin (HCC) 12/13/2016  ? Asthma 07/30/2016  ? 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/07/2016  ? DM neuropathy, type II diabetes mellitus (HCC) 08/24/2015  ? ? ?Current Outpatient Medications on File Prior to Visit  ?Medication Sig Dispense Refill  ? atorvastatin (LIPITOR) 20 MG tablet Take 1 tablet (20 mg total) by mouth daily. 90 tablet 1  ? ibuprofen (ADVIL) 800 MG tablet Take 1 tablet (800 mg total) by mouth every 8 (eight) hours as needed. 30 tablet 0  ? insulin aspart (NOVOLOG FLEXPEN) 100 UNIT/ML FlexPen Inject into the skin 3 (three) times daily before meals. ONLY WHEN INSULIN PUMP MALFUNCTION'S PER ENDOCRINOLOGY DIRECTION (Patient not taking: Reported on 07/31/2021)    ? insulin aspart (NOVOLOG) 100 UNIT/ML injection USE VIA INSULIN PUMP. MAX TOTAL DAILY DOSE OF 300 UNITS  (Patient not taking: Reported on 06/14/2021) 90 mL 5  ? insulin aspart (NOVOLOG) 100 UNIT/ML injection USE VIA INSULIN PUMP. MAX TOTAL DAILY DOSE OF 300 UNITS 90 mL 5  ? insulin glargine, 1 Unit Dial, (TOUJEO SOLOSTAR) 300 UNIT/ML Solostar Pen Administer 50 units once daily only if insulin pump malfunction (Patient not taking: Reported on 05/04/2021) 9 mL 1  ? Insulin Human (INSULIN PUMP) SOLN Inject 1 each into the skin See admin instructions. Medication: Novolog 100 units/ml injection. Takes 100 units via pump per patient. (Patient not taking: Reported on 06/14/2021)    ? Insulin Syringe-Needle U-100 31G X 5/16" 1 ML MISC inject 10 units into the skin 3 times daily. use to inject novolog based on insulin to card ratio- max daily dose 150 units (Patient not taking: Reported on 06/14/2021) 100 each 2  ? linaclotide (LINZESS) 72 MCG capsule Take 1 capsule (72 mcg total) by mouth daily before breakfast. (Patient not taking: Reported on 06/14/2021) 30 capsule 3  ? lisinopril (ZESTRIL) 20 MG tablet Take 1 tablet (20 mg total) by mouth daily. 90 tablet 2  ? Misc. Devices MISC CPAP therapy on autopap 5-15.  ?Needs Small size [employer] Full Face Mask Simplus  ?mask and heated humidification. (Patient not taking: Reported on 06/14/2021) 1 each 0  ? ondansetron (ZOFRAN ODT) 8 MG disintegrating tablet Take 1 tablet (8 mg total) by mouth every 8 (eight) hours as needed for nausea or vomiting. 20 tablet 0  ? traMADol (ULTRAM) 50 MG  tablet Take 1 tablet (50 mg total) by mouth every 6 (six) hours as needed. (Patient not taking: Reported on 06/14/2021) 20 tablet 0  ? Vitamin D, Ergocalciferol, (DRISDOL) 1.25 MG (50000 UNIT) CAPS capsule Take 1 capsule (50,000 Units total) by mouth every 7 (seven) days. 12 capsule 0  ? [DISCONTINUED] ARIPiprazole (ABILIFY) 5 MG tablet Take 1 tablet (5 mg total) by mouth daily. 30 tablet 2  ? ?No current facility-administered medications on file prior to visit.  ? ? ?Allergies  ?Allergen Reactions  ?  Morphine And Related Shortness Of Breath  ? Glyburide Diarrhea and Other (See Comments)  ?  Reaction:  Nose bleeds   ? Hydrocodone Other (See Comments)  ?  Pt states that this medication makes her "dream about rabbits chasing her"  ? Ivp Dye [Iodinated Contrast Media] Other (See Comments)  ?  Shortness of breath.    ? Oxycodone Other (See Comments)  ?  Pt states that this medication makes her "dream about rabbits chasing" her.    ? ? ?Objective: ?There were no vitals filed for this visit. ? ?General: No acute distress, AAOx3  ?Right foot: Surgical site well-healed with mild dry skin at incision areas, +swelling to the heel and dorsal lateral foot, no warmth, no drainage, no signs of infection noted, Capillary fill time <3 seconds in all digits, gross sensation present via light touch to right foot.  There is significant guarding due to pain patient will barely let me touch her foot and ankle and complains of a pulling sensation to the calf which is likely self-induced from patient constantly walking on her tiptoes.  No warmth to calf no acute signs of DVT. ? ?Assessment and Plan:  ?Problem List Items Addressed This Visit   ? ?  ? Endocrine  ? DM neuropathy, type II diabetes mellitus (HCC)  ? ?Other Visit Diagnoses   ? ? S/P foot surgery    -  Primary  ? Intractable right heel pain      ? Plantar fasciitis, right      ? Complex regional pain syndrome type 1 of right lower extremity      ? Relevant Medications  ? pregabalin (LYRICA) 75 MG capsule  ? meloxicam (MOBIC) 7.5 MG tablet  ? acetaminophen (TYLENOL) 500 MG tablet  ? cyclobenzaprine (FLEXERIL) 10 MG tablet  ? ?  ?  ?-Patient seen and evaluated ?-Discussed with patient extent of her chronic pain ?-Rx Flexeril to take at bedtime to help to relax her muscles ?-Refilled Mobic for pain and inflammation ?-Refill Lyrica for any sharp shooting nerve related pains ?-Rx topical Flector patch for patient to use as directed to painful areas at the right foot ?-Advised  patient to continue with physical therapy to tolerance may benefit from aquatics ?-May weight-bear to tolerance in a normal shoe with compression anklet for edema control ? -Discussed again with patient that her healing may be likely hindered due to her uncontrolled diabetes and to the fact that she previously had COVID which could be affecting her immune system and ability to heal ?-Return to office 1 month or sooner problems or issues arise ?Asencion Islam, DPM ? ?

## 2021-08-04 DIAGNOSIS — R002 Palpitations: Secondary | ICD-10-CM | POA: Diagnosis not present

## 2021-08-07 ENCOUNTER — Telehealth: Payer: Self-pay | Admitting: Cardiology

## 2021-08-07 NOTE — Telephone Encounter (Signed)
Patient states she has a monitor on, and it's not staying on. She call the company and they told her to send it back. She says she only has had it on for 3 days.  ?

## 2021-08-08 ENCOUNTER — Other Ambulatory Visit: Payer: Self-pay

## 2021-08-08 NOTE — Telephone Encounter (Signed)
[employer] notified to cancel charges on first monitor and to mail the patient a replacement.  Patient instructed to mail first monitor back to process any data obtained. ?

## 2021-08-09 ENCOUNTER — Other Ambulatory Visit: Payer: Self-pay

## 2021-08-10 ENCOUNTER — Ambulatory Visit: Payer: Medicaid Other | Admitting: Cardiology

## 2021-08-15 ENCOUNTER — Ambulatory Visit: Payer: Medicaid Other | Attending: Internal Medicine

## 2021-08-15 ENCOUNTER — Other Ambulatory Visit: Payer: Self-pay

## 2021-08-15 DIAGNOSIS — Z3042 Encounter for surveillance of injectable contraceptive: Secondary | ICD-10-CM

## 2021-08-15 MED ORDER — MEDROXYPROGESTERONE ACETATE 150 MG/ML IM SUSP
150.0000 mg | Freq: Once | INTRAMUSCULAR | Status: AC
  Administered 2021-08-15: 150 mg via INTRAMUSCULAR

## 2021-08-15 NOTE — Progress Notes (Signed)
Pt was given depo injection in right ventragluteal. ?Pt tolerated shot well ?No urine HCG needed. ?Pt to return MAY 29- JUN-12 ?

## 2021-08-18 NOTE — Therapy (Incomplete)
?OUTPATIENT PHYSICAL THERAPY TREATMENT NOTE/ Re-Authorization ? ? ?Patient Name: Sarah Garrison ?MRN: [id number] ?DOB:[birth year], 41 y.o., female ?Today's Date: 08/18/2021 ? ?PCP: Maggie Font, FNP ?REFERRING PROVIDER: Maggie Font, FNP ? ? ? ? ? ?Past Medical History:  ?Diagnosis Date  ? Anxiety   ? Asthma   ? Chronic constipation   ? Chronic shortness of breath   ? Depression   ? GERD (gastroesophageal reflux disease)   ? History of COVID-19 09/29/2019  ? 09-28-2020 result in epic; hospital admission 10-05-2019 for covid pneumonia with hypoxic acute respiratory failure, no oxygen needed when discharged 10-06-2019;  and positive 06-13-2020 result in epic, pt stated no symptoms  ? History of seizures   ? (05-04-2021  pt stated was told during hospital admission 05/ 2021 with covid , she had 2 seizures,  stated no medication and no seizure since)  ? Hypertension   ? Insulin dependent type 2 diabetes mellitus (HCC)   ? endocrinologist-- Fredia Sorrow NP;   pt uses insulin pump  ? Insulin pump in place   ? Mixed hyperlipidemia   ? OSA (obstructive sleep apnea)   ? ( 05-04-2021 pt stated has not used cpap since 05/ 2021) followed by dr byrum;  study in epic 02-16-2019 moderate , titrated cpap 08-28-2019  ? Plantar fasciitis, right   ? w/ chronic pain  ? ?Past Surgical History:  ?Procedure Laterality Date  ? CESAREAN SECTION  1999  ? PLANTAR FASCIA RELEASE Right 05/08/2021  ? Procedure: ENDOSCOPIC PLANTAR FASCIOTOMY;  Surgeon: Asencion Islam, DPM;  Location: [facility] [place] [facility];  Service: Podiatry;  Laterality: Right;  WITH BLOCK  ? STERIOD INJECTION Bilateral 05/08/2021  ? Procedure: PLATELET RICH PLASMA INJECTION HEELS;  Surgeon: Asencion Islam, DPM;  Location: [facility];  Service: Podiatry;  Laterality: Bilateral;  ? TUBAL LIGATION Bilateral 09/29/2006  ? @[facility] ;   PP[facility]  ? VENTRAL HERNIA REPAIR N/A 11/27/2017  ? Procedure: VENTRAL HERNIA REPAIR ERAS PATHWAY;  Surgeon: 11/29/2017, MD;   Location: [facility];  Service: General;  Laterality: N/A;  ? WISDOM TOOTH EXTRACTION    ? ?Patient Active Problem List  ? Diagnosis Date Noted  ? Palpitations 07/31/2021  ? Fatigue 07/31/2021  ? Primary hypertension 07/31/2021  ? Hip pain 12/08/2019  ? Hyperlipidemia associated with type 2 diabetes mellitus (HCC) 11/23/2019  ? History of COVID-19 10/27/2019  ? Stuttering 10/27/2019  ? Depression with anxiety 10/27/2019  ? Obesity, Class III, BMI 40-49.9 (morbid obesity) (HCC) 06/30/2019  ? OSA (obstructive sleep apnea) 02/16/2019  ? Essential hypertension 06/22/2018  ? Type 2 diabetes mellitus with hyperosmolarity without coma, with long-term current use of insulin (HCC) 12/13/2016  ? Asthma 07/30/2016  ? 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/07/2016  ? DM neuropathy, type II diabetes mellitus (HCC) 08/24/2015  ? ? ?REFERRING DIAG: Z98.890 (ICD-10-CM) - S/P foot surgery ? ?THERAPY DIAG:  ?No diagnosis found. ? ?PERTINENT HISTORY: Right ENDOSCOPIC PLANTAR FASCIOTOMY 05/08/2021 ? ?SUBJECTIVE:  ?*** ? ?PAIN:  ?Are you having pain? Yes ?NPRS scale: 6/10 ?Pain location: Medial Rt arch ?PAIN TYPE: aching and tight ?Pain description: constant  ? ? ?OBJECTIVE:  ? *Unless otherwise noted, objective information collected previously* ? ?DIAGNOSTIC FINDINGS: 05/27/2021 DG Foot Complete Right: IMPRESSION: ?No acute fracture or dislocation. ?  ?  ?COGNITION: ?         Overall cognitive status: Within functional limits for tasks assessed              ?          ?  SENSATION: ?         Light touch: Appears intact ?          ?  ?MUSCLE LENGTH: ?Gastrocnemius: Severely limited on Lt, not tested on Rt due to heightened pain ?Soleus: Severely limited on Lt, not tested on Rt due to heightened pain ?  ?POSTURE:  ?WNL ?  ?PALPATION: ?Exquisite TTP to plantar heel and medial arch on Rt ?07/04/21 significant palpable tenderness Rt heel; no warmth noted  ?  ?LE AROM/PROM: ?  ?AROM/PROM Right ?06/14/2021 Left ?06/14/2021 Right ?07/22/2021  ?Ankle  dorsiflexion -25 0 -18/ -12p!  ?Ankle plantarflexion 38 45 38  ?Ankle inversion 8 35 10/15p!  ?Ankle eversion 8 10 -3/0p!  ? (Blank rows = not tested) ?  ?LE MMT: ?  ?MMT Right ?06/14/2021 Left ?06/14/2021 Right ?07/22/2021  ?Ankle dorsiflexion 2+/5p! 5/5 3/5p!  ?Ankle plantarflexion 2+/5p! 5/5 2+/5p!  ?Ankle inversion 2+/5p! 5/5 2+/5p!  ?Ankle eversion 2+/5p! 5/5 3/5p!  ? (Blank rows = not tested) ?  ?  ?FUNCTIONAL TESTS:  ?Deferred due to high pain state and difficulty with standing ?  ?GAIT: ?Distance walked: 54ft ?Assistive device utilized: None ?Level of assistance: Complete Independence ?Comments: Pt walked with antalgic gait, weight bearing through her Rt lateral metatarsals (no heel strike) ? 07/04/21: maintains plantarflexion during stance on the RLE ? ?OBSERVATION: 07/04/21 mild redness and swelling about the heel  ?  ?TODAY'S TREATMENT: ? ?[facility] Adult PT Treatment:                                                DATE: *** ?Therapeutic Exercise: ?*** ?Manual Therapy: ?*** ?Neuromuscular re-ed: ?*** ?Therapeutic Activity: ?*** ?Modalities: ?*** ?Self Care: ?*** ? ? ?[facility] Adult PT Treatment:                                                DATE: 07/22/2021 ?Therapeutic Exercise: ?Long-sitting plantar fascia stretch with sheet on Rt x2 minutes ?Seated great toe extension mobilization with rolled towel ?Seated Rt ankle eversion towel scrunch x5 ?Seated Rt ankle inversion towel scrunch x5 ?Manual Therapy: ?Gradually increased light touch to Rt medial arch for desensitization ?Neuromuscular re-ed: ?N/A ?Therapeutic Activity: ?N/A ?Modalities: ?Ice pack to Rt foot in sitting; no adverse effects noted ?Self Care: ?N/A ? ? ?[facility] Adult PT Treatment:                                                DATE: 07/13/21 ?Therapeutic Exercise: ? ?nu-step L5 67m while taking subjective and planning session with patient ?Bike attempted, but not tolerated ? ?Therapeutic Activity ?- after finishing warm up I took dorsalis pedis pulse and it  felt like patient was tachycardic.  Using a pulse ox, HR was measured variably between 115 and 126 after extended rest.  I discussed at length that this was not a normal pulse rate with Manuella and recommended that she go the urgent care or the ED to rule out a serious cause; she agrees to plan.  ? ?  ?  ?  ?PATIENT EDUCATION:  ?Education details: Updated HEP, educated  on importance of daily desensitization techniques ?Person educated: Patient ?Education method: Explanation ?Education comprehension: verbalized understanding  ?  ?  ?HOME EXERCISE PROGRAM: ?Access Code: [email or web address] ?  ?Exercises ?Seated Calf Stretch with Strap - 1 x daily - 7 x weekly - 2 sets - 1-min hold ?Seated Toe Raise - 1 x daily - 7 x weekly - 3 sets - 10 reps ?Seated Heel Raise - 1 x daily - 7 x weekly - 3 sets - 10 reps ?Seated Ankle Inversion Eversion AROM - 1 x daily - 7 x weekly - 3 sets - 10 reps ?Seated Ankle Inversion AROM - 1 x daily - 7 x weekly - 3 sets - 10 reps ? ?07/22/2021: ?Ankle Inversion Eversion Towel Slide - 1 x daily - 7 x weekly - 2 sets - 5 reps ?Big toe mobilization - 1 x daily - 7 x weekly - 3 sets - 30sec hold ?Long Sitting Plantar Fascia Stretch with Towel - 1 x daily - 7 x weekly - 3 sets - 30sec hold ? ?  ?  ?ASSESSMENT: ?  ?CLINICAL IMPRESSION: ?*** ?  ?REHAB POTENTIAL: Good ?  ?CLINICAL DECISION MAKING: Stable/uncomplicated ?  ?EVALUATION COMPLEXITY: Low ?  ?  ?GOALS: ?Goals reviewed with patient? Yes ?  ?SHORT TERM GOALS: ?  ?STG Name Target Date Goal status  ?1 Pt will report understanding and adherence to her HEP in order to promote independence in the management of her primary impairments. ?Baseline: HEP provided at eval ?07/22/2021: Pt reports non-adherence due to pain 07/12/2021 Ongoing - pt limited d/t pain  ?  ?LONG TERM GOALS:  ?  ?LTG Name Target Date Goal status  ?1 Pt will achieve global Rt ankle strength of 4/5 or higher in order to independently progress her LE strengthening regimen without  limitation. ?Baseline: globally 2+/5 ?07/22/2021: See upodated MMT chart 08/09/2021 Ongoing  ?2 Pt will achieve BIL ankle DF AROM of 5 degrees in order to promote WNL gait pattern. ?Baseline: See AROM chart ?07/22/2021: Se

## 2021-08-19 ENCOUNTER — Telehealth: Payer: Self-pay

## 2021-08-19 NOTE — Telephone Encounter (Signed)
Spoke with pt regarding her first no-show. Discussed the clinic's attendance policy and provided the clinic phone number to call to schedule future appointments. She expressed understanding. ?

## 2021-08-21 ENCOUNTER — Telehealth: Payer: Self-pay | Admitting: *Deleted

## 2021-08-21 NOTE — Telephone Encounter (Signed)
Email received 08/21/21 12:20 PM ? ?Hello,  ? ?This email is to notify you that the patient listed below had an issue where the patch began to flash within 48 hours and we were unable to get it to stop. We have placed an order for a replacement device to be overnighted to the patient and placed a hold on the billing, so the patient is not charged for the first device. If you have any questions, please respond to this email, or call us at [phone].  ? ?Patient initials:  A.M ?Patient ID: [id number] ?SN: [id number] ?Ticket: [id number] ? ? Account: [facility]- [address]  ? ?Thank you, ?Laurance ?[employer] Customer Care ?

## 2021-08-30 ENCOUNTER — Telehealth: Payer: Self-pay | Admitting: Cardiology

## 2021-08-30 NOTE — Telephone Encounter (Signed)
LMVM- We have about 4.5 days worth of data from Monitor #1 and Monitor #2.  According to UPS tracking, your 3rd monitor was delivered 08/22/21.  Assuming you applied that monitor on 08/23/21, we would have and additional 7 days of data, equalling 11.5-12 days out of the 14 prescribed days.  Go ahead and mail the third monitor back for processing.  [employer] will combine all 3 monitors into one report. ?We would recommend Benadryl Cream or Calamine Lotion for your rash. Please give [place] a call if you need further assistance. ?

## 2021-08-30 NOTE — Telephone Encounter (Signed)
Patient called and says that heart monitor keeps falling off. Says that adhesive tape is thin and it is also breaking her out. Patient has a rash  ?

## 2021-09-13 NOTE — Telephone Encounter (Signed)
[email or web address] ?Since the patient is having significant irritation/ rash from the ZIO XT monitor we would like you to combine the information on monitors 1, 2 , and 3 and provide [place] with a final report.  It seems Ms. Ator's skin irritation is progressive and it is highly unlikely she would be able to tolerate a 4th monitor. ?Thank you,  ?Teira Arcilla/ [employer] ?____________________________________ ? ?[email or web address]  ?Sent: Sunday, September 10, 2021 1:42 PM ? ?Hello, ? ?The below patient?s patch came off within the first 24 hours and we were unable to get it to re-adhere. The patient has also reported irritation to the monitor. If a replacement monitor is approved this will be the patient's 4th Zio patch since being ordered in February 2023. ?Can you please confirm which of the following ways you would like to proceed so we can move forward accordingly? ? ?Yes, I would like to wait for the data and decide after I see what was captured. I will contact you if a replacement is necessary. ? ?No, I cannot wait for this device to come back and be analyzed. I would like you to ship a replacement immediately to the patient. ?Patient initials: A.M. ?Patient ID: [id number] ? ?SN: [id number] ?Ticket: [id number] ?Reported wear time: less than 24hrs  ? ?Account: [facility]  ? ?Please reply to this email letting us know if you would like option 1 or 2, or call customer care at [phone], as soon as possible. ? ?Thank you, ?[employer] Customer Care ? ?  ?

## 2021-09-23 ENCOUNTER — Encounter: Payer: Self-pay | Admitting: Cardiology

## 2021-09-25 ENCOUNTER — Other Ambulatory Visit: Payer: Self-pay

## 2021-09-25 ENCOUNTER — Encounter (HOSPITAL_COMMUNITY): Payer: Self-pay

## 2021-09-25 ENCOUNTER — Ambulatory Visit (HOSPITAL_COMMUNITY)
Admission: EM | Admit: 2021-09-25 | Discharge: 2021-09-25 | Disposition: A | Discharge status: Home / Self Care | Payer: Medicaid Other

## 2021-09-25 DIAGNOSIS — L601 Onycholysis: Secondary | ICD-10-CM | POA: Diagnosis not present

## 2021-09-25 NOTE — Discharge Instructions (Addendum)
You were seen today for your missing nail to left ring finger.  ?Please continue to apply ointment to your nailbed and change your Band-Aid daily. ?You will have to wait for your nail to grow out and this can take some time. ?Please monitor your nailbed for signs of infection like worsening redness, swelling, drainage, and new or worsening pain. ?Please take ibuprofen and Tylenol as needed for your pain.  I expect your pain to get better in the next day or 2 as your injury heals and inflammation goes down.  ?Please return for any new or worsening symptoms and follow-up with your primary care provider for further evaluation and management. ?

## 2021-09-25 NOTE — ED Triage Notes (Signed)
Pt states he real nail came off last night and now she has swelling and extreme pain in her left ring finger. ?

## 2021-09-25 NOTE — ED Provider Notes (Signed)
?[facility] ? ? ? ?CSN: [id number] ?Arrival date & time: 09/25/21  1928 ? ? ?  ? ?History   ?Chief Complaint ?Chief Complaint  ?Patient presents with  ? Nail Problem  ? ? ?HPI ?NEKAYLA HEIDER is a 41 y.o. female.  ? ?Patient presents to urgent care for evaluation of her missing nail to her left ring finger. Patient was removing clothing from the clothing dryer when her nail  got caught in the door of the dryer. She tried to pull her hand away gently to not rip her nail, but she wasn't able to get it unstuck. She pulled her hand away forcefully and ended up ripping her entire nail off of her left ring finger. The bleeding from the nailbed did not stop for approximately 1 hour. Patient irrigated nailbed with soap and water and reports burning with this. She applied neosporin to her nailbed last night and covered it with a Band Aid. Reports throbbing pain to left ring finger at this time. Sensation intact distal to injury. Took tylenol without any relief last night for pain. Reports increased swelling today. Reports personal history of diabetes.  Also reports personal history of hypertension, but does not take any medications for this.  Denies any other aggravating or relieving factors.  ? ? ? ? ? ?Past Medical History:  ?Diagnosis Date  ? Anxiety   ? Asthma   ? Chronic constipation   ? Chronic shortness of breath   ? Depression   ? GERD (gastroesophageal reflux disease)   ? History of COVID-19 09/29/2019  ? 09-28-2020 result in epic; hospital admission 10-05-2019 for covid pneumonia with hypoxic acute respiratory failure, no oxygen needed when discharged 10-06-2019;  and positive 06-13-2020 result in epic, pt stated no symptoms  ? History of seizures   ? (05-04-2021  pt stated was told during hospital admission 05/ 2021 with covid , she had 2 seizures,  stated no medication and no seizure since)  ? Hypertension   ? Insulin dependent type 2 diabetes mellitus (HCC)   ? endocrinologist-- Fredia Sorrow NP;    pt uses insulin pump  ? Insulin pump in place   ? Mixed hyperlipidemia   ? OSA (obstructive sleep apnea)   ? ( 05-04-2021 pt stated has not used cpap since 05/ 2021) followed by dr byrum;  study in epic 02-16-2019 moderate , titrated cpap 08-28-2019  ? Plantar fasciitis, right   ? w/ chronic pain  ? ? ?Patient Active Problem List  ? Diagnosis Date Noted  ? Palpitations 07/31/2021  ? Fatigue 07/31/2021  ? Primary hypertension 07/31/2021  ? Hip pain 12/08/2019  ? Hyperlipidemia associated with type 2 diabetes mellitus (HCC) 11/23/2019  ? History of COVID-19 10/27/2019  ? Stuttering 10/27/2019  ? Depression with anxiety 10/27/2019  ? Obesity, Class III, BMI 40-49.9 (morbid obesity) (HCC) 06/30/2019  ? OSA (obstructive sleep apnea) 02/16/2019  ? Essential hypertension 06/22/2018  ? Type 2 diabetes mellitus with hyperosmolarity without coma, with long-term current use of insulin (HCC) 12/13/2016  ? Asthma 07/30/2016  ? 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/07/2016  ? DM neuropathy, type II diabetes mellitus (HCC) 08/24/2015  ? ? ?Past Surgical History:  ?Procedure Laterality Date  ? CESAREAN SECTION  1999  ? PLANTAR FASCIA RELEASE Right 05/08/2021  ? Procedure: ENDOSCOPIC PLANTAR FASCIOTOMY;  Surgeon: Asencion Islam, DPM;  Location: [facility] [place];  Service: Podiatry;  Laterality: Right;  WITH BLOCK  ? STERIOD INJECTION Bilateral 05/08/2021  ? Procedure: PLATELET RICH PLASMA INJECTION  HEELS;  Surgeon: Asencion Islam, DPM;  Location: [facility];  Service: Podiatry;  Laterality: Bilateral;  ? TUBAL LIGATION Bilateral 09/29/2006  ? @[facility] ;   PPTL  ? VENTRAL HERNIA REPAIR N/A 11/27/2017  ? Procedure: VENTRAL HERNIA REPAIR ERAS PATHWAY;  Surgeon: 11/29/2017, MD;  Location: [facility];  Service: General;  Laterality: N/A;  ? WISDOM TOOTH EXTRACTION    ? ? ?OB History   ? ? Gravida  ?5  ? Para  ?4  ? Term  ?   ? Preterm  ?   ? AB  ?1  ? Living  ?4  ?  ? ? SAB  ?1  ? IAB  ?   ? Ectopic  ?   ?  Multiple  ?   ? Live Births  ?   ?   ?  ?  ? ? ? ?Home Medications   ? ?Prior to Admission medications   ?Medication Sig Start Date End Date Taking? Authorizing Provider  ?acetaminophen (TYLENOL) 500 MG tablet Take 1 tablet (500 mg total) by mouth every 6 (six) hours as needed. 08/03/21   10/03/21, DPM  ?atorvastatin (LIPITOR) 20 MG tablet Take 1 tablet (20 mg total) by mouth daily. 05/05/21     ?cyclobenzaprine (FLEXERIL) 10 MG tablet Take 1 tablet (10 mg total) by mouth at bedtime. 08/03/21   10/03/21, DPM  ?diclofenac (FLECTOR) 1.3 % PTCH Place 1 patch onto the skin 2 (two) times daily. 08/03/21   10/03/21, DPM  ?ibuprofen (ADVIL) 800 MG tablet Take 1 tablet (800 mg total) by mouth every 8 (eight) hours as needed. 07/13/21   Lamptey, 09/10/21, MD  ?insulin aspart (NOVOLOG FLEXPEN) 100 UNIT/ML FlexPen Inject into the skin 3 (three) times daily before meals. ONLY WHEN INSULIN PUMP MALFUNCTION'S PER ENDOCRINOLOGY DIRECTION ?Patient not taking: Reported on 07/31/2021    [provider]  ?insulin aspart (NOVOLOG) 100 UNIT/ML injection USE VIA INSULIN PUMP. MAX TOTAL DAILY DOSE OF 300 UNITS ?Patient not taking: Reported on 06/14/2021 02/17/21     ?insulin aspart (NOVOLOG) 100 UNIT/ML injection USE VIA INSULIN PUMP. MAX TOTAL DAILY DOSE OF 300 UNITS 02/24/21     ?insulin glargine, 1 Unit Dial, (TOUJEO SOLOSTAR) 300 UNIT/ML Solostar Pen Administer 50 units once daily only if insulin pump malfunction ?Patient not taking: Reported on 05/04/2021 02/24/21     ?Insulin Human (INSULIN PUMP) SOLN Inject 1 each into the skin See admin instructions. Medication: Novolog 100 units/ml injection. Takes 100 units via pump per patient. ?Patient not taking: Reported on 06/14/2021    [provider]  ?Insulin Syringe-Needle U-100 31G X 5/16" 1 ML MISC inject 10 units into the skin 3 times daily. use to inject novolog based on insulin to card ratio- max daily dose 150 units ?Patient not taking: Reported on 06/14/2021  09/23/20     ?linaclotide (LINZESS) 72 MCG capsule Take 1 capsule (72 mcg total) by mouth daily before breakfast. ?Patient not taking: Reported on 06/14/2021 04/26/21   04/28/21, MD  ?lisinopril (ZESTRIL) 20 MG tablet Take 1 tablet (20 mg total) by mouth daily. 02/24/21     ?meloxicam (MOBIC) 7.5 MG tablet Take 1 tablet (7.5 mg total) by mouth 2 (two) times daily with a meal. 08/03/21   10/03/21, DPM  ?Misc. Devices MISC CPAP therapy on autopap 5-15.  ?Needs Small size Fisher&Paykel Full Face Mask Simplus  ?mask and heated humidification. ?Patient not taking: Reported on 06/14/2021 09/13/19  Claiborne RiggFleming, Zelda W, NP  ?ondansetron (ZOFRAN ODT) 8 MG disintegrating tablet Take 1 tablet (8 mg total) by mouth every 8 (eight) hours as needed for nausea or vomiting. 02/21/21   Rhys MartiniGraham, Laura E, PA-C  ?pregabalin (LYRICA) 75 MG capsule Take 1 capsule (75 mg total) by mouth 2 (two) times daily. 08/03/21   Asencion IslamStover, Titorya, DPM  ?traMADol (ULTRAM) 50 MG tablet Take 1 tablet (50 mg total) by mouth every 6 (six) hours as needed. ?Patient not taking: Reported on 06/14/2021 05/08/21 05/08/22  Asencion IslamStover, Titorya, DPM  ?Vitamin D, Ergocalciferol, (DRISDOL) 1.25 MG (50000 UNIT) CAPS capsule Take 1 capsule (50,000 Units total) by mouth every 7 (seven) days. 08/01/21   Thomasene Rippleobb, Kardie, DO  ?ARIPiprazole (ABILIFY) 5 MG tablet Take 1 tablet (5 mg total) by mouth daily. 10/27/19 06/13/20  Sater, Pearletha Furlichard A, MD  ? ? ?Family History ?Family History  ?Problem Relation Age of Onset  ? Asthma Mother   ? Kidney failure Mother   ? Brain cancer Mother   ? Asthma Father   ? Other Father   ?     surgery on stomach but don't know from what  ? Diabetes Brother   ? Diabetes Maternal Grandmother   ? ? ?Social History ?Social History  ? ?Tobacco Use  ? Smoking status: Former  ?  Packs/day: 0.10  ?  Years: 2.00  ?  Pack years: 0.20  ?  Types: Cigarettes  ?  Quit date: 11/27/2017  ?  Years since quitting: 3.8  ? Smokeless tobacco: Never  ?Vaping Use  ? Vaping Use: Some  days  ? Devices: Elfvar  (05-04-2021 per pt last vaped approx 2 wks ago)  ?Substance Use Topics  ? Alcohol use: No  ? Drug use: Never  ? ? ? ?Allergies   ?Morphine and related, Glyburide, Hydrocodone, Ivp

## 2021-10-10 ENCOUNTER — Other Ambulatory Visit: Payer: Self-pay

## 2021-10-10 ENCOUNTER — Other Ambulatory Visit: Payer: Self-pay | Admitting: Family Medicine

## 2021-10-10 DIAGNOSIS — K219 Gastro-esophageal reflux disease without esophagitis: Secondary | ICD-10-CM

## 2021-10-11 ENCOUNTER — Other Ambulatory Visit: Payer: Self-pay

## 2021-10-11 NOTE — Telephone Encounter (Signed)
/  Requested medication (s) are due for refill today: yes ? ?Requested medication (s) are on the active medication list: no ? ?Last refill:  06/02/21 ? ?Future visit scheduled: no ? ?Notes to clinic:  rx was dc'd on 07/13/21.  ? ? ?  ?Requested Prescriptions  ?Pending Prescriptions Disp Refills  ? pantoprazole (PROTONIX) 40 MG tablet 30 tablet 3  ?  Sig: TAKE 1 TABLET (40 MG TOTAL) BY MOUTH DAILY.  ?  ? Gastroenterology: Proton Pump Inhibitors Passed - 10/10/2021  9:55 AM  ?  ?  Passed - Valid encounter within last 12 months  ?  Recent Outpatient Visits   ? ?      ? 2 months ago Acute non-recurrent maxillary sinusitis  ? [facility] [place], [place] W, NP  ? 5 months ago Other constipation  ? [facility] H[facility] Register, MD  ? 11 months ago Annual physical exam  ? [facility] Hoy Register, MD  ? 1 year ago Bilateral carpal tunnel syndrome  ? [facility] [place], [place], [place]  ? 1 year ago Acute non-recurrent sinusitis of other sinus  ? [facility] Hoy Register, MD  ? ?  ?  ? ? ?  ?  ?  ? ?

## 2021-10-12 ENCOUNTER — Other Ambulatory Visit: Payer: Self-pay

## 2021-10-13 ENCOUNTER — Other Ambulatory Visit: Payer: Self-pay

## 2021-10-13 MED ORDER — PANTOPRAZOLE SODIUM 40 MG PO TBEC
DELAYED_RELEASE_TABLET | Freq: Every day | ORAL | 0 refills | Status: DC
  Filled 2021-10-13: qty 30, 30d supply, fill #0

## 2021-11-13 ENCOUNTER — Other Ambulatory Visit: Payer: Self-pay

## 2021-11-13 MED ORDER — ATORVASTATIN CALCIUM 20 MG PO TABS
20.0000 mg | ORAL_TABLET | Freq: Every day | ORAL | 1 refills | Status: DC
  Filled 2021-11-13 – 2022-03-09 (×2): qty 90, 90d supply, fill #0

## 2021-11-13 MED ORDER — INSULIN ASPART 100 UNIT/ML IJ SOLN
INTRAMUSCULAR | 5 refills | Status: DC
  Filled 2021-11-13 – 2021-12-12 (×2): qty 90, 45d supply, fill #0
  Filled 2022-01-18 (×2): qty 90, 45d supply, fill #1
  Filled 2022-03-09 – 2022-03-16 (×2): qty 90, 45d supply, fill #2
  Filled 2022-05-29: qty 80, 40d supply, fill #3

## 2021-11-13 MED ORDER — LISINOPRIL 20 MG PO TABS
20.0000 mg | ORAL_TABLET | Freq: Every day | ORAL | 2 refills | Status: DC
  Filled 2021-11-13 – 2022-03-09 (×2): qty 90, 90d supply, fill #0

## 2021-11-20 ENCOUNTER — Other Ambulatory Visit: Payer: Self-pay

## 2021-11-24 ENCOUNTER — Ambulatory Visit: Payer: Medicaid Other | Admitting: Podiatry

## 2021-11-24 ENCOUNTER — Telehealth: Payer: Self-pay | Admitting: *Deleted

## 2021-11-24 DIAGNOSIS — M722 Plantar fascial fibromatosis: Secondary | ICD-10-CM | POA: Diagnosis not present

## 2021-11-24 DIAGNOSIS — Z9889 Other specified postprocedural states: Secondary | ICD-10-CM

## 2021-11-24 DIAGNOSIS — M79671 Pain in right foot: Secondary | ICD-10-CM

## 2021-11-29 NOTE — Progress Notes (Signed)
Subjective:  Patient ID: Sarah Garrison, female    DOB: 10-May-1981,  MRN: [id number]  Chief Complaint  Patient presents with   Routine Post Op    DOS: 05/08/2021 Procedure: Right EPF  41 y.o. female returns for post-op check.  Patient states she is doing okay.  She still has some pain.  She states that she is a diabetic with A1c of 7.7.  She had surgery done by Dr. Marylene Land.  She wanted to discuss if there is anything else that could be done.  She denies any other acute complaints  Review of Systems: Negative except as noted in the HPI. Denies N/V/F/Ch.  Past Medical History:  Diagnosis Date   Anxiety    Asthma    Chronic constipation    Chronic shortness of breath    Depression    GERD (gastroesophageal reflux disease)    History of COVID-19 09/29/2019   09-28-2020 result in epic; hospital admission 10-05-2019 for covid pneumonia with hypoxic acute respiratory failure, no oxygen needed when discharged 10-06-2019;  and positive 06-13-2020 result in epic, pt stated no symptoms   History of seizures    (05-04-2021  pt stated was told during hospital admission 05/ 2021 with covid , she had 2 seizures,  stated no medication and no seizure since)   Hypertension    Insulin dependent type 2 diabetes mellitus [facility])    endocrinologist-- Fredia Sorrow NP;   pt uses insulin pump   Insulin pump in place    Mixed hyperlipidemia    OSA (obstructive sleep apnea)    ( 05-04-2021 pt stated has not used cpap since 05/ 2021) followed by dr byrum;  study in epic 02-16-2019 moderate , titrated cpap 08-28-2019   Plantar fasciitis, right    w/ chronic pain    Current Outpatient Medications:    acetaminophen (TYLENOL) 500 MG tablet, Take 1 tablet (500 mg total) by mouth every 6 (six) hours as needed., Disp: 30 tablet, Rfl: 0   atorvastatin (LIPITOR) 20 MG tablet, Take 1 tablet (20 mg total) by mouth once daily., Disp: 90 tablet, Rfl: 1   cyclobenzaprine (FLEXERIL) 10 MG tablet, Take 1 tablet (10 mg  total) by mouth at bedtime., Disp: 30 tablet, Rfl: 0   diclofenac (FLECTOR) 1.3 % PTCH, Place 1 patch onto the skin 2 (two) times daily., Disp: 60 patch, Rfl: 1   ibuprofen (ADVIL) 800 MG tablet, Take 1 tablet (800 mg total) by mouth every 8 (eight) hours as needed., Disp: 30 tablet, Rfl: 0   insulin aspart (NOVOLOG FLEXPEN) 100 UNIT/ML FlexPen, Inject into the skin 3 (three) times daily before meals. ONLY WHEN INSULIN PUMP MALFUNCTION'S PER ENDOCRINOLOGY DIRECTION (Patient not taking: Reported on 07/31/2021), Disp: , Rfl:    insulin aspart (NOVOLOG) 100 UNIT/ML injection, USE VIA INSULIN PUMP. MAX TOTAL DAILY DOSE OF 200 UNITS, Disp: 90 mL, Rfl: 5   insulin glargine, 1 Unit Dial, (TOUJEO SOLOSTAR) 300 UNIT/ML Solostar Pen, Administer 50 units once daily only if insulin pump malfunction (Patient not taking: Reported on 05/04/2021), Disp: 9 mL, Rfl: 1   Insulin Human (INSULIN PUMP) SOLN, Inject 1 each into the skin See admin instructions. Medication: Novolog 100 units/ml injection. Takes 100 units via pump per patient. (Patient not taking: Reported on 06/14/2021), Disp: , Rfl:    Insulin Syringe-Needle U-100 31G X 5/16" 1 ML MISC, inject 10 units into the skin 3 times daily. use to inject novolog based on insulin to card ratio- max daily dose 150 units (  Patient not taking: Reported on 06/14/2021), Disp: 100 each, Rfl: 2   linaclotide (LINZESS) 72 MCG capsule, Take 1 capsule (72 mcg total) by mouth daily before breakfast. (Patient not taking: Reported on 06/14/2021), Disp: 30 capsule, Rfl: 3   lisinopril (ZESTRIL) 20 MG tablet, Take 1 tablet (20 mg total) by mouth once daily., Disp: 90 tablet, Rfl: 2   meloxicam (MOBIC) 7.5 MG tablet, Take 1 tablet (7.5 mg total) by mouth 2 (two) times daily with a meal., Disp: 15 tablet, Rfl: 0   Misc. Devices MISC, CPAP therapy on autopap 5-15.  Needs Small size Fisher[employer]Paykel Full Face Mask Simplus  mask and heated humidification. (Patient not taking: Reported on 06/14/2021),  Disp: 1 each, Rfl: 0   ondansetron (ZOFRAN ODT) 8 MG disintegrating tablet, Take 1 tablet (8 mg total) by mouth every 8 (eight) hours as needed for nausea or vomiting., Disp: 20 tablet, Rfl: 0   pantoprazole (PROTONIX) 40 MG tablet, TAKE 1 TABLET (40 MG TOTAL) BY MOUTH DAILY., Disp: 30 tablet, Rfl: 0   pregabalin (LYRICA) 75 MG capsule, Take 1 capsule (75 mg total) by mouth 2 (two) times daily., Disp: 90 capsule, Rfl: 1   traMADol (ULTRAM) 50 MG tablet, Take 1 tablet (50 mg total) by mouth every 6 (six) hours as needed. (Patient not taking: Reported on 06/14/2021), Disp: 20 tablet, Rfl: 0   Vitamin D, Ergocalciferol, (DRISDOL) 1.25 MG (50000 UNIT) CAPS capsule, Take 1 capsule (50,000 Units total) by mouth every 7 (seven) days., Disp: 12 capsule, Rfl: 0  Social History   Tobacco Use  Smoking Status Former   Packs/day: 0.10   Years: 2.00   Total pack years: 0.20   Types: Cigarettes   Quit date: 11/27/2017   Years since quitting: 4.0  Smokeless Tobacco Never    Allergies  Allergen Reactions   Morphine And Related Shortness Of Breath   Glyburide Diarrhea and Other (See Comments)    Reaction:  Nose bleeds    Hydrocodone Other (See Comments)    Pt states that this medication makes her "dream about rabbits chasing her"   Ivp Dye [Iodinated Contrast Media] Other (See Comments)    Shortness of breath.     Oxycodone Other (See Comments)    Pt states that this medication makes her "dream about rabbits chasing" her.     Objective:  There were no vitals filed for this visit. There is no height or weight on file to calculate BMI. Constitutional Well developed. Well nourished.  Vascular Foot warm and well perfused. Capillary refill normal to all digits.   Neurologic Normal speech. Oriented to person, place, and time. Epicritic sensation to light touch grossly present bilaterally.  Dermatologic Skin completely reepithelialized.  Reduction of tightness of plantar fascia noted  Orthopedic:  Tenderness to palpation noted about the surgical site.   Radiographs: None Assessment:   1. Plantar fasciitis, right   2. Intractable right heel pain    Plan:  Patient was evaluated and treated and all questions answered.  S/p foot surgery right -Progressing as expected post-operatively. -At this time I discussed physical therapy to help with strengthening the foot.  I discussed with patient she states understanding she would like to go to [facility] for physical therapy -Referral to physical therapy was placed  No follow-ups on file.

## 2021-12-01 ENCOUNTER — Ambulatory Visit: Payer: Medicaid Other | Admitting: Podiatry

## 2021-12-08 NOTE — Therapy (Signed)
OUTPATIENT PHYSICAL THERAPY LOWER EXTREMITY EVALUATION   Patient Name: Sarah Garrison MRN: [id number] DOB:1980-11-07, 41 y.o., female Today's Date: 12/08/2021    Past Medical History:  Diagnosis Date   Anxiety    Asthma    Chronic constipation    Chronic shortness of breath    Depression    GERD (gastroesophageal reflux disease)    History of COVID-19 09/29/2019   09-28-2020 result in epic; hospital admission 10-05-2019 for covid pneumonia with hypoxic acute respiratory failure, no oxygen needed when discharged 10-06-2019;  and positive 06-13-2020 result in epic, pt stated no symptoms   History of seizures    (05-04-2021  pt stated was told during hospital admission 05/ 2021 with covid , she had 2 seizures,  stated no medication and no seizure since)   Hypertension    Insulin dependent type 2 diabetes mellitus [facility])    endocrinologist-- Fredia Sorrow NP;   pt uses insulin pump   Insulin pump in place    Mixed hyperlipidemia    OSA (obstructive sleep apnea)    ( 05-04-2021 pt stated has not used cpap since 05/ 2021) followed by dr byrum;  study in epic 02-16-2019 moderate , titrated cpap 08-28-2019   Plantar fasciitis, right    w/ chronic pain   Past Surgical History:  Procedure Laterality Date   CESAREAN SECTION  1999   PLANTAR FASCIA RELEASE Right 05/08/2021   Procedure: ENDOSCOPIC PLANTAR FASCIOTOMY;  Surgeon: Asencion Islam, DPM;  Location: [facility];  Service: Podiatry;  Laterality: Right;  WITH BLOCK   STERIOD INJECTION Bilateral 05/08/2021   Procedure: PLATELET RICH PLASMA INJECTION HEELS;  Surgeon: Asencion Islam, DPM;  Location: [facility];  Service: Podiatry;  Laterality: Bilateral;   TUBAL LIGATION Bilateral 09/29/2006   @[facility] ;   [facility]   VENTRAL HERNIA REPAIR N/A 11/27/2017   Procedure: VENTRAL HERNIA REPAIR ERAS PATHWAY;  Surgeon: 11/29/2017, MD;  Location: [facility];  Service: General;  Laterality: N/A;    WISDOM TOOTH EXTRACTION     Patient Active Problem List   Diagnosis Date Noted   Palpitations 07/31/2021   Fatigue 07/31/2021   Primary hypertension 07/31/2021   Hip pain 12/08/2019   Hyperlipidemia associated with type 2 diabetes mellitus (HCC) 11/23/2019   History of COVID-19 10/27/2019   Stuttering 10/27/2019   Depression with anxiety 10/27/2019   Obesity, Class III, BMI 40-49.9 (morbid obesity) (HCC) 06/30/2019   OSA (obstructive sleep apnea) 02/16/2019   Essential hypertension 06/22/2018   Type 2 diabetes mellitus with hyperosmolarity without coma, with long-term current use of insulin (HCC) 12/13/2016   Asthma 07/30/2016   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/07/2016   DM neuropathy, type II diabetes mellitus (HCC) 08/24/2015    PCP: 08/26/2015, FNP  REFERRING PROVIDER: Maggie Font, DPM   REFERRING DIAG: M72.2 (ICD-10-CM) - Plantar fasciitis, right   THERAPY DIAG:  No diagnosis found.  Rationale for Evaluation and Treatment Rehabilitation  ONSET DATE: ***  SUBJECTIVE:   SUBJECTIVE STATEMENT: ***  PERTINENT HISTORY: Right ENDOSCOPIC PLANTAR FASCIOTOMY 05/08/2021  PAIN:  Are you having pain? {OPRCPAIN:27236}  PRECAUTIONS: {Therapy precautions:24002}  WEIGHT BEARING RESTRICTIONS {Yes ***/No:24003}  FALLS:  Has patient fallen in last 6 months? {fallsyesno:27318}  LIVING ENVIRONMENT: Lives with: {OPRC lives with:25569::"lives with their family"} Lives in: {Lives in:25570} Stairs: {opstairs:27293} Has following equipment at home: {Assistive devices:23999}  OCCUPATION: ***  PLOF: {PLOF:24004}  PATIENT GOALS ***   OBJECTIVE:   DIAGNOSTIC FINDINGS: ***  PATIENT SURVEYS:  {  rehab surveys:24030}  COGNITION:  Overall cognitive status: {cognition:24006}     SENSATION: {sensation:27233}  EDEMA:  {edema:24020}  MUSCLE LENGTH: Hamstrings: Right *** deg; Left *** deg Maisie Fus test: Right *** deg; Left *** deg  POSTURE:  {posture:25561}  PALPATION: ***  LOWER EXTREMITY ROM:  {AROM/PROM:27142} ROM Right eval Left eval  Hip flexion    Hip extension    Hip abduction    Hip adduction    Hip internal rotation    Hip external rotation    Knee flexion    Knee extension    Ankle dorsiflexion    Ankle plantarflexion    Ankle inversion    Ankle eversion     (Blank rows = not tested)  LOWER EXTREMITY MMT:  MMT Right eval Left eval  Hip flexion    Hip extension    Hip abduction    Hip adduction    Hip internal rotation    Hip external rotation    Knee flexion    Knee extension    Ankle dorsiflexion    Ankle plantarflexion    Ankle inversion    Ankle eversion     (Blank rows = not tested)  LOWER EXTREMITY SPECIAL TESTS:  {LEspecialtests:26242}  FUNCTIONAL TESTS:  {Functional tests:24029}  GAIT: Distance walked: *** Assistive device utilized: {Assistive devices:23999} Level of assistance: {Levels of assistance:24026} Comments: ***    TODAY'S TREATMENT: ***   PATIENT EDUCATION:  Education details: *** Person educated: {Person educated:25204} Education method: {Education Method:25205} Education comprehension: {Education Comprehension:25206}   HOME EXERCISE PROGRAM: ***  ASSESSMENT:  CLINICAL IMPRESSION: Patient is a *** y.o. *** who was seen today for physical therapy evaluation and treatment for ***.    OBJECTIVE IMPAIRMENTS {opptimpairments:25111}.   ACTIVITY LIMITATIONS {activitylimitations:27494}  PARTICIPATION LIMITATIONS: {participationrestrictions:25113}  PERSONAL FACTORS {Personal factors:25162} are also affecting patient's functional outcome.   REHAB POTENTIAL: {rehabpotential:25112}  CLINICAL DECISION MAKING: {clinical decision making:25114}  EVALUATION COMPLEXITY: {Evaluation complexity:25115}   GOALS: Goals reviewed with patient? {yes/no:20286}  SHORT TERM GOALS: Target date: {follow up:25551}  *** Baseline: Goal status:  {GOALSTATUS:25110}  2.  *** Baseline:  Goal status: {GOALSTATUS:25110}  3.  *** Baseline:  Goal status: {GOALSTATUS:25110}  4.  *** Baseline:  Goal status: {GOALSTATUS:25110}  5.  *** Baseline:  Goal status: {GOALSTATUS:25110}  6.  *** Baseline:  Goal status: {GOALSTATUS:25110}  LONG TERM GOALS: Target date: {follow up:25551}   *** Baseline:  Goal status: {GOALSTATUS:25110}  2.  *** Baseline:  Goal status: {GOALSTATUS:25110}  3.  *** Baseline:  Goal status: {GOALSTATUS:25110}  4.  *** Baseline:  Goal status: {GOALSTATUS:25110}  5.  *** Baseline:  Goal status: {GOALSTATUS:25110}  6.  *** Baseline:  Goal status: {GOALSTATUS:25110}   PLAN: PT FREQUENCY: {rehab frequency:25116}  PT DURATION: {rehab duration:25117}  PLANNED INTERVENTIONS: {rehab planned interventions:25118::"Therapeutic exercises","Therapeutic activity","Neuromuscular re-education","Balance training","Gait training","Patient/Family education","Joint mobilization"}  PLAN FOR NEXT SESSION: ***   Derald Macleod, PT 12/08/2021, 1:33 PM

## 2021-12-11 ENCOUNTER — Ambulatory Visit: Payer: Medicaid Other | Attending: Podiatry

## 2021-12-11 DIAGNOSIS — M722 Plantar fascial fibromatosis: Secondary | ICD-10-CM | POA: Diagnosis not present

## 2021-12-11 DIAGNOSIS — R262 Difficulty in walking, not elsewhere classified: Secondary | ICD-10-CM | POA: Insufficient documentation

## 2021-12-11 DIAGNOSIS — M79671 Pain in right foot: Secondary | ICD-10-CM | POA: Diagnosis present

## 2021-12-11 DIAGNOSIS — M6281 Muscle weakness (generalized): Secondary | ICD-10-CM | POA: Insufficient documentation

## 2021-12-11 DIAGNOSIS — R600 Localized edema: Secondary | ICD-10-CM | POA: Insufficient documentation

## 2021-12-12 ENCOUNTER — Ambulatory Visit: Payer: Medicaid Other | Attending: Family Medicine

## 2021-12-12 ENCOUNTER — Other Ambulatory Visit: Payer: Self-pay | Admitting: Family Medicine

## 2021-12-12 ENCOUNTER — Other Ambulatory Visit: Payer: Self-pay

## 2021-12-12 DIAGNOSIS — Z3042 Encounter for surveillance of injectable contraceptive: Secondary | ICD-10-CM

## 2021-12-12 DIAGNOSIS — K219 Gastro-esophageal reflux disease without esophagitis: Secondary | ICD-10-CM

## 2021-12-12 LAB — POCT URINE PREGNANCY: Preg Test, Ur: NEGATIVE

## 2021-12-12 MED ORDER — MEDROXYPROGESTERONE ACETATE 150 MG/ML IM SUSP
150.0000 mg | Freq: Once | INTRAMUSCULAR | Status: AC
  Administered 2021-12-12: 150 mg via INTRAMUSCULAR

## 2021-12-12 NOTE — Progress Notes (Signed)
Pt was given depo injection in right ventragluteal. Pt tolerated shot well Urine HCG needed: Pt to return SEPT 26- OCT 10

## 2021-12-13 ENCOUNTER — Encounter: Payer: Self-pay | Admitting: Family Medicine

## 2021-12-13 ENCOUNTER — Ambulatory Visit: Payer: Medicaid Other | Attending: Family Medicine | Admitting: Family Medicine

## 2021-12-13 ENCOUNTER — Other Ambulatory Visit: Payer: Self-pay

## 2021-12-13 DIAGNOSIS — T753XXA Motion sickness, initial encounter: Secondary | ICD-10-CM

## 2021-12-13 DIAGNOSIS — K219 Gastro-esophageal reflux disease without esophagitis: Secondary | ICD-10-CM

## 2021-12-13 DIAGNOSIS — F418 Other specified anxiety disorders: Secondary | ICD-10-CM

## 2021-12-13 DIAGNOSIS — J302 Other seasonal allergic rhinitis: Secondary | ICD-10-CM | POA: Diagnosis not present

## 2021-12-13 MED ORDER — MECLIZINE HCL 12.5 MG PO TABS
12.5000 mg | ORAL_TABLET | Freq: Two times a day (BID) | ORAL | 0 refills | Status: DC | PRN
  Filled 2021-12-13 – 2021-12-19 (×2): qty 30, 15d supply, fill #0

## 2021-12-13 MED ORDER — SERTRALINE HCL 100 MG PO TABS
100.0000 mg | ORAL_TABLET | Freq: Every day | ORAL | 1 refills | Status: DC
  Filled 2021-12-13: qty 90, 90d supply, fill #0
  Filled 2022-03-09: qty 90, 90d supply, fill #1

## 2021-12-13 MED ORDER — OLOPATADINE HCL 0.1 % OP SOLN
1.0000 [drp] | Freq: Two times a day (BID) | OPHTHALMIC | 1 refills | Status: DC
  Filled 2021-12-13: qty 5, 20d supply, fill #0
  Filled 2022-03-09: qty 5, 25d supply, fill #0

## 2021-12-13 MED ORDER — LEVOCETIRIZINE DIHYDROCHLORIDE 5 MG PO TABS
5.0000 mg | ORAL_TABLET | Freq: Every evening | ORAL | 3 refills | Status: DC
  Filled 2021-12-13: qty 30, 30d supply, fill #0

## 2021-12-13 MED ORDER — FLUTICASONE PROPIONATE 50 MCG/ACT NA SUSP
2.0000 | Freq: Every day | NASAL | 1 refills | Status: DC
  Filled 2021-12-13: qty 16, 30d supply, fill #0
  Filled 2022-03-09: qty 16, 30d supply, fill #1

## 2021-12-13 MED ORDER — PANTOPRAZOLE SODIUM 40 MG PO TBEC
DELAYED_RELEASE_TABLET | Freq: Every day | ORAL | 1 refills | Status: DC
  Filled 2021-12-13: qty 90, 90d supply, fill #0
  Filled 2022-03-09: qty 30, 30d supply, fill #1

## 2021-12-13 NOTE — Progress Notes (Signed)
Virtual Visit via Telephone Note  I connected with Sarah Garrison, on 12/13/2021 at 8:25 AM by telephone and verified that I am speaking with the correct person using two identifiers.   Consent: I discussed the limitations, risks, security and privacy concerns of performing an evaluation and management service by telephone and the availability of in person appointments. I also discussed with the patient that there may be a patient responsible charge related to this service. The patient expressed understanding and agreed to proceed.   Location of Patient: Home  Location of Provider: Clinic   Persons participating in Telemedicine visit: Sarah Garrison Dr. Alvis Lemmings     History of Present Illness: Sarah Garrison is a 41 y.o. year old female with a history of type 2 diabetes mellitus (A1c 7.7 from 11/2021 -followed by endocrinology, [facility]), obesity, history of Covid in 09/2019, anxiety, GERD seen for an office visit.   C/o itchy eyes, itchy throat, nasal congestion, postnasal drip.  She has no fever, myalgias, sinus pressure or pain Also request refill for PPI for GERD and refill of her SSRI for her anxiety. She will be going on a cruise and is requesting prescription for motion sickness. Past Medical History:  Diagnosis Date   Anxiety    Asthma    Chronic constipation    Chronic shortness of breath    Depression    GERD (gastroesophageal reflux disease)    History of COVID-19 09/29/2019   09-28-2020 result in epic; hospital admission 10-05-2019 for covid pneumonia with hypoxic acute respiratory failure, no oxygen needed when discharged 10-06-2019;  and positive 06-13-2020 result in epic, pt stated no symptoms   History of seizures    (05-04-2021  pt stated was told during hospital admission 05/ 2021 with covid , she had 2 seizures,  stated no medication and no seizure since)   Hypertension    Insulin dependent type 2 diabetes mellitus [facility])    endocrinologist-- Fredia Sorrow NP;   pt uses insulin pump   Insulin pump in place    Mixed hyperlipidemia    OSA (obstructive sleep apnea)    ( 05-04-2021 pt stated has not used cpap since 05/ 2021) followed by dr byrum;  study in epic 02-16-2019 moderate , titrated cpap 08-28-2019   Plantar fasciitis, right    w/ chronic pain   Allergies  Allergen Reactions   Morphine And Related Shortness Of Breath   Glyburide Diarrhea and Other (See Comments)    Reaction:  Nose bleeds    Hydrocodone Other (See Comments)    Pt states that this medication makes her "dream about rabbits chasing her"   Ivp Dye [Iodinated Contrast Media] Other (See Comments)    Shortness of breath.     Oxycodone Other (See Comments)    Pt states that this medication makes her "dream about rabbits chasing" her.      Current Outpatient Medications on File Prior to Visit  Medication Sig Dispense Refill   acetaminophen (TYLENOL) 500 MG tablet Take 1 tablet (500 mg total) by mouth every 6 (six) hours as needed. 30 tablet 0   atorvastatin (LIPITOR) 20 MG tablet Take 1 tablet (20 mg total) by mouth once daily. 90 tablet 1   cyclobenzaprine (FLEXERIL) 10 MG tablet Take 1 tablet (10 mg total) by mouth at bedtime. 30 tablet 0   diclofenac (FLECTOR) 1.3 % PTCH Place 1 patch onto the skin 2 (two) times daily. 60 patch 1   ibuprofen (ADVIL) 800 MG  tablet Take 1 tablet (800 mg total) by mouth every 8 (eight) hours as needed. 30 tablet 0   insulin aspart (NOVOLOG FLEXPEN) 100 UNIT/ML FlexPen Inject into the skin 3 (three) times daily before meals. ONLY WHEN INSULIN PUMP MALFUNCTION'S PER ENDOCRINOLOGY DIRECTION (Patient not taking: Reported on 07/31/2021)     insulin aspart (NOVOLOG) 100 UNIT/ML injection USE VIA INSULIN PUMP. MAX TOTAL DAILY DOSE OF 200 UNITS 90 mL 5   insulin glargine, 1 Unit Dial, (TOUJEO SOLOSTAR) 300 UNIT/ML Solostar Pen Administer 50 units once daily only if insulin pump malfunction (Patient not taking: Reported on 05/04/2021) 9 mL 1    Insulin Human (INSULIN PUMP) SOLN Inject 1 each into the skin See admin instructions. Medication: Novolog 100 units/ml injection. Takes 100 units via pump per patient. (Patient not taking: Reported on 06/14/2021)     Insulin Syringe-Needle U-100 31G X 5/16" 1 ML MISC inject 10 units into the skin 3 times daily. use to inject novolog based on insulin to card ratio- max daily dose 150 units (Patient not taking: Reported on 06/14/2021) 100 each 2   linaclotide (LINZESS) 72 MCG capsule Take 1 capsule (72 mcg total) by mouth daily before breakfast. (Patient not taking: Reported on 06/14/2021) 30 capsule 3   lisinopril (ZESTRIL) 20 MG tablet Take 1 tablet (20 mg total) by mouth once daily. 90 tablet 2   meloxicam (MOBIC) 7.5 MG tablet Take 1 tablet (7.5 mg total) by mouth 2 (two) times daily with a meal. 15 tablet 0   Misc. Devices MISC CPAP therapy on autopap 5-15.  Needs Small size Fisher&Paykel Full Face Mask Simplus  mask and heated humidification. (Patient not taking: Reported on 06/14/2021) 1 each 0   ondansetron (ZOFRAN ODT) 8 MG disintegrating tablet Take 1 tablet (8 mg total) by mouth every 8 (eight) hours as needed for nausea or vomiting. 20 tablet 0   pantoprazole (PROTONIX) 40 MG tablet TAKE 1 TABLET (40 MG TOTAL) BY MOUTH DAILY. 30 tablet 0   pregabalin (LYRICA) 75 MG capsule Take 1 capsule (75 mg total) by mouth 2 (two) times daily. 90 capsule 1   traMADol (ULTRAM) 50 MG tablet Take 1 tablet (50 mg total) by mouth every 6 (six) hours as needed. (Patient not taking: Reported on 06/14/2021) 20 tablet 0   Vitamin D, Ergocalciferol, (DRISDOL) 1.25 MG (50000 UNIT) CAPS capsule Take 1 capsule (50,000 Units total) by mouth every 7 (seven) days. 12 capsule 0   [DISCONTINUED] ARIPiprazole (ABILIFY) 5 MG tablet Take 1 tablet (5 mg total) by mouth daily. 30 tablet 2   No current facility-administered medications on file prior to visit.    ROS: See HPI  Observations/Objective: Awake, alert, oriented  x3 Not in acute distress Normal mood      Latest Ref Rng & Units 05/27/2021    6:11 PM 05/08/2021   12:48 PM 10/26/2020    9:08 AM  CMP  Glucose 70 - 99 mg/dL 401  027  253   BUN 6 - 20 mg/dL 9  13  12    Creatinine 0.44 - 1.00 mg/dL  6.64  4.03   Sodium 135 - 145 mmol/L 137  141  137   Potassium 3.5 - 5.1 mmol/L 3.9  3.7  4.8   Chloride 98 - 111 mmol/L 108  106  103   CO2 22 - 32 mmol/L 21   20   Calcium 8.9 - 10.3 mg/dL 8.8   9.6   Total Protein 6.0 - 8.5  g/dL   6.6   Total Bilirubin 0.0 - 1.2 mg/dL   0.5   Alkaline Phos 44 - 121 IU/L   109   AST 0 - 40 IU/L   10   ALT 0 - 32 IU/L   20     Lipid Panel     Component Value Date/Time   CHOL 244 (H) 10/26/2020 0908   TRIG 85 10/26/2020 0908   HDL 50 10/26/2020 0908   CHOLHDL 4.9 (H) 10/26/2020 0908   CHOLHDL 6 03/03/2018 1419   VLDL 37.4 03/03/2018 1419   LDLCALC 179 (H) 10/26/2020 0908   LABVLDL 15 10/26/2020 0908    Lab Results  Component Value Date   HGBA1C 12.2 (H) 10/07/2019    Assessment and Plan: 1. Seasonal allergies - levocetirizine (XYZAL ALLERGY 24HR) 5 MG tablet; Take 1 tablet (5 mg total) by mouth every evening.  Dispense: 30 tablet; Refill: 3 - fluticasone (FLONASE) 50 MCG/ACT nasal spray; Place 2 sprays into both nostrils daily.  Dispense: 16 g; Refill: 1 - olopatadine (PATANOL) 0.1 % ophthalmic solution; Place 1 drop into both eyes 2 (two) times daily.  Dispense: 5 mL; Refill: 1  2. Gastroesophageal reflux disease, unspecified whether esophagitis present Stable - pantoprazole (PROTONIX) 40 MG tablet; TAKE 1 TABLET (40 MG TOTAL) BY MOUTH DAILY.  Dispense: 90 tablet; Refill: 1  3. Depression with anxiety Controlled - sertraline (ZOLOFT) 100 MG tablet; Take 1 tablet (100 mg total) by mouth daily.  Dispense: 90 tablet; Refill: 1  4. Motion sickness, initial encounter - meclizine (ANTIVERT) 12.5 MG tablet; Take 1 tablet (12.5 mg total) by mouth 2 (two) times daily as needed (Motion sickness).   Dispense: 30 tablet; Refill: 0   Follow Up Instructions: Keep previously scheduled appointment   I discussed the assessment and treatment plan with the patient. The patient was provided an opportunity to ask questions and all were answered. The patient agreed with the plan and demonstrated an understanding of the instructions.   The patient was advised to call back or seek an in-person evaluation if the symptoms worsen or if the condition fails to improve as anticipated.     I provided 12 minutes total of non-face-to-face time during this encounter.   Hoy Register, MD, FAAFP. [facility] [place], [place] [phone]   12/13/2021, 8:25 AM

## 2021-12-19 ENCOUNTER — Ambulatory Visit: Payer: Medicaid Other

## 2021-12-19 ENCOUNTER — Other Ambulatory Visit: Payer: Self-pay

## 2021-12-19 DIAGNOSIS — M6281 Muscle weakness (generalized): Secondary | ICD-10-CM

## 2021-12-19 DIAGNOSIS — R262 Difficulty in walking, not elsewhere classified: Secondary | ICD-10-CM

## 2021-12-19 DIAGNOSIS — R600 Localized edema: Secondary | ICD-10-CM

## 2021-12-19 DIAGNOSIS — M79671 Pain in right foot: Secondary | ICD-10-CM | POA: Diagnosis not present

## 2021-12-19 NOTE — Therapy (Signed)
OUTPATIENT PHYSICAL THERAPY TREATMENT NOTE   Patient Name: Sarah Garrison MRN: [id number] DOB:Sep 01, 1980, 41 y.o., female Today's Date: 12/19/2021  PCP: Maggie Font, FNP REFERRING PROVIDER: Candelaria Stagers, DPM  END OF SESSION:   PT End of Session - 12/19/21 1618     Visit Number 2    Number of Visits 7    Date for PT Re-Evaluation 01/27/22    Authorization Type [employer]    Authorization Time Period 12/12/2021-01/10/2022    Authorization - Visit Number 1    Authorization - Number of Visits 4    PT Start Time 1620    PT Stop Time 1708   10 minutes cold pack   PT Time Calculation (min) 48 min    Activity Tolerance Patient limited by pain    Behavior During Therapy [facility] for tasks assessed/performed             Past Medical History:  Diagnosis Date   Anxiety    Asthma    Chronic constipation    Chronic shortness of breath    Depression    GERD (gastroesophageal reflux disease)    History of COVID-19 09/29/2019   09-28-2020 result in epic; hospital admission 10-05-2019 for covid pneumonia with hypoxic acute respiratory failure, no oxygen needed when discharged 10-06-2019;  and positive 06-13-2020 result in epic, pt stated no symptoms   History of seizures    (05-04-2021  pt stated was told during hospital admission 05/ 2021 with covid , she had 2 seizures,  stated no medication and no seizure since)   Hypertension    Insulin dependent type 2 diabetes mellitus (HCC)    endocrinologist-- Fredia Sorrow NP;   pt uses insulin pump   Insulin pump in place    Mixed hyperlipidemia    OSA (obstructive sleep apnea)    ( 05-04-2021 pt stated has not used cpap since 05/ 2021) followed by dr byrum;  study in epic 02-16-2019 moderate , titrated cpap 08-28-2019   Plantar fasciitis, right    w/ chronic pain   Past Surgical History:  Procedure Laterality Date   CESAREAN SECTION  1999   PLANTAR FASCIA RELEASE Right 05/08/2021   Procedure: ENDOSCOPIC PLANTAR FASCIOTOMY;   Surgeon: Asencion Islam, DPM;  Location: [facility];  Service: Podiatry;  Laterality: Right;  WITH BLOCK   STERIOD INJECTION Bilateral 05/08/2021   Procedure: PLATELET RICH PLASMA INJECTION HEELS;  Surgeon: Asencion Islam, DPM;  Location: [facility];  Service: Podiatry;  Laterality: Bilateral;   TUBAL LIGATION Bilateral 09/29/2006   @[facility] ;   [facility]   VENTRAL HERNIA REPAIR N/A 11/27/2017   Procedure: VENTRAL HERNIA REPAIR ERAS PATHWAY;  Surgeon: 11/29/2017, MD;  Location: [facility];  Service: General;  Laterality: N/A;   WISDOM TOOTH EXTRACTION     Patient Active Problem List   Diagnosis Date Noted   Palpitations 07/31/2021   Fatigue 07/31/2021   Primary hypertension 07/31/2021   Hip pain 12/08/2019   Hyperlipidemia associated with type 2 diabetes mellitus (HCC) 11/23/2019   History of COVID-19 10/27/2019   Stuttering 10/27/2019   Depression with anxiety 10/27/2019   Obesity, Class III, BMI 40-49.9 (morbid obesity) (HCC) 06/30/2019   OSA (obstructive sleep apnea) 02/16/2019   Essential hypertension 06/22/2018   Type 2 diabetes mellitus with hyperosmolarity without coma, with long-term current use of insulin (HCC) 12/13/2016   Asthma 07/30/2016   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/07/2016   DM neuropathy, type II diabetes mellitus (HCC) 08/24/2015  REFERRING DIAG: M72.2 (ICD-10-CM) - Plantar fasciitis, right   THERAPY DIAG:  Pain in right foot  Muscle weakness (generalized)  Difficulty in walking, not elsewhere classified  Localized edema  Rationale for Evaluation and Treatment Rehabilitation  PERTINENT HISTORY: Right ENDOSCOPIC PLANTAR FASCIOTOMY 05/08/2021 History of seizures    SUBJECTIVE: Pt reports continued Rt foot pain. She reports adherence to her HEP.   PAIN:  Are you having pain? Yes: NPRS scale: 5/10 Pain location: Rt calcaneous Pain description: shooting Aggravating factors: walking, standing Relieving factors:  medication   OBJECTIVE: (objective measures completed at initial evaluation unless otherwise dated)   DIAGNOSTIC FINDINGS: No recent imaging    PATIENT SURVEYS:  LEFS 25/80   COGNITION:           Overall cognitive status: Within functional limits for tasks assessed                          SENSATION: WFL   EDEMA:  Figure 8: 60 cm Rt; 57 cm Lt   MUSCLE LENGTH: Not tested   POSTURE: rounded shoulders and forward head In standing: maintains Rt knee in flexion and Rt foot in plantarflexion (will not bear weight through her heel)   PALPATION: Extreme hypersensitivity to light touch about Rt calcaneous and achilles tendon     LOWER EXTREMITY ROM:   Active ROM Right eval Left eval  Hip flexion      Hip extension      Hip abduction      Hip adduction      Hip internal rotation      Hip external rotation      Knee flexion      Knee extension      Ankle dorsiflexion Lacking 10 WNL  Ankle plantarflexion WNL WNL  Ankle inversion 2 WNL  Ankle eversion 6 WNL   (Blank rows = not tested)   LOWER EXTREMITY MMT:   MMT Right eval Left eval  Hip flexion      Hip extension      Hip abduction      Hip adduction      Hip internal rotation      Hip external rotation      Knee flexion      Knee extension      Ankle dorsiflexion 3- 5  Ankle plantarflexion 3+ in sitting 5  Ankle inversion 3- 5  Ankle eversion 3- 5   (Blank rows = not tested)   LOWER EXTREMITY SPECIAL TESTS:  Not tested    FUNCTIONAL TESTS:  Not tested    GAIT: Distance walked: 20 ft Assistive device utilized: None Level of assistance: Complete Independence Comments: maintains foot in plantarflexion during stance on the RLE (will not put heel on the ground)        TODAY'S TREATMENT:  [facility] Adult PT Treatment:                                                DATE: 12/19/2021 Therapeutic Exercise: Seated ankle inversion towel slide with 4# dumbbell x2 length of towel on Rt Seated ankle eversion  towel slide with 4# dumbbel x2 length of towel on Rt Seated towel toe scrunches x3 length of towel Kickstand stance with front foot on edge of Airex pad 3x30sec with Rt foot forward Long-sitting plantar fascia stretch with  sheet x16min on Rt Long-sitting Rt ankle circles 2x30 Manual Therapy: N/A Neuromuscular re-ed: N/A Therapeutic Activity: N/A Modalities: Supine with LE elevated with ice over Rt foot Self Care: N/A   [facility] Adult PT Treatment:                                                DATE: 12/11/21 Therapeutic Exercise: Demonstrated and issue initial HEP.      Therapeutic Activity: Education on assessment findings that will be addressed throughout duration of POC.            PATIENT EDUCATION:  Education details: see above Person educated: Patient Education method: Explanation, Demonstration, Tactile cues, Verbal cues, and Handouts Education comprehension: verbalized understanding, returned demonstration, verbal cues required, tactile cues required, and needs further education     HOME EXERCISE PROGRAM: Access Code: [email or web address] URL: [email or web address] Date: 12/11/2021 Prepared by: Letitia Libra   Exercises - Supine Ankle Pumps  - 3 x daily - 7 x weekly - 2 sets - 10 reps - Seated Heel Toe Raises  - 3 x daily - 7 x weekly - 3 sets - 10 reps - Long Sitting Calf Stretch with Strap  - 3 x daily - 7 x weekly - 3 sets - 30 sec hold - Towel Desensitization  - 3 x daily - 7 x weekly - 5 minutes hold   ASSESSMENT:   CLINICAL IMPRESSION: Pt continues to have functional difficulties with low-level ankle strengthening. She reports high levels of pain with all exercises, but was able to complete all with good form with PT assistance and cuing. She will continue to benefit from skilled PT to address her primary impairments and return to her prior level of function with less limitation.      OBJECTIVE IMPAIRMENTS Abnormal gait, decreased activity tolerance,  decreased balance, decreased knowledge of condition, decreased mobility, difficulty walking, decreased ROM, decreased strength, increased edema, increased fascial restrictions, impaired flexibility, impaired sensation, improper body mechanics, postural dysfunction, and pain.    ACTIVITY LIMITATIONS carrying, lifting, bending, standing, squatting, stairs, and locomotion level   PARTICIPATION LIMITATIONS: meal prep, cleaning, laundry, shopping, community activity, and yard work   PERSONAL FACTORS Fitness and Time since onset of injury/illness/exacerbation are also affecting patient's functional outcome.        GOALS: Goals reviewed with patient? No   SHORT TERM GOALS: Target date: 01/02/2022  Patient will be able to achieve foot flat in standing and tolerate for at least 3 minutes to improve her ability to complete household tasks.  Baseline: unable Goal status: INITIAL   2.  Patient will be able to put pressure through her heel when laying down to improve sleep quality.  Baseline: unable to tolerate her heel touching the mat table Goal status: INITIAL   3.  Patient will improve ankle dorsiflexion AROM by at least 5 degrees to improve gait mechanics.  Baseline: lacking 10  Goal status: INITIAL       LONG TERM GOALS: Target date: 01/23/2022    Patient will be able to achieve foot flat during stance phase of gait cycle on the RLE.  Baseline: unable Goal status: INITIAL   2.  Patient will demonstrate at least 4-/5 strength in the Rt ankle to improve stability with walking activity.  Baseline: see above Goal status: INITIAL   3.  Patient will improve Rt ankle  inversion and eversion AROM by at least 5 degrees to improve gait mechanics.  Baseline: see above Goal status: INITIAL   4. Patient will score at least 35/80 on LEFS to signify clinically meaningful improvement in functional abilities.    Baseline: see above Goal status: INITIAL         PLAN: PT FREQUENCY: 1x/week    PT DURATION: 6 weeks   PLANNED INTERVENTIONS: Therapeutic exercises, Therapeutic activity, Neuromuscular re-education, Balance training, Gait training, Patient/Family education, Joint mobilization, Stair training, Aquatic Therapy, Dry Needling, Electrical stimulation, Cryotherapy, Moist heat, Vasopneumatic device, Ultrasound, Ionotophoresis 4mg /ml Dexamethasone, Manual therapy, and Re-evaluation   PLAN FOR NEXT SESSION: desensitization, calf stretching, gentle ankle mobility, needs to schedule aquatic visit!    , PT, DPT 12/19/21 5:13 PM

## 2021-12-20 ENCOUNTER — Other Ambulatory Visit: Payer: Self-pay

## 2021-12-22 ENCOUNTER — Ambulatory Visit: Payer: Medicaid Other | Admitting: Podiatry

## 2021-12-28 ENCOUNTER — Ambulatory Visit: Payer: Medicaid Other

## 2022-01-01 NOTE — Therapy (Signed)
OUTPATIENT PHYSICAL THERAPY TREATMENT NOTE   Patient Name: Sarah Garrison MRN: [id number] DOB:08-20-1980, 41 y.o., female Today's Date: 01/03/2022  PCP: Maggie Font, FNP REFERRING PROVIDER: Candelaria Stagers, DPM  END OF SESSION:   PT End of Session - 01/03/22 0824     Visit Number 3    Number of Visits 7    Date for PT Re-Evaluation 01/27/22    Authorization Type [employer]    Authorization Time Period 12/12/2021-01/10/2022    Authorization - Visit Number 2    Authorization - Number of Visits 4    PT Start Time 1700    PT Stop Time 1745    PT Time Calculation (min) 45 min    Activity Tolerance Patient limited by pain    Behavior During Therapy [facility] for tasks assessed/performed              Past Medical History:  Diagnosis Date   Anxiety    Asthma    Chronic constipation    Chronic shortness of breath    Depression    GERD (gastroesophageal reflux disease)    History of COVID-19 09/29/2019   09-28-2020 result in epic; hospital admission 10-05-2019 for covid pneumonia with hypoxic acute respiratory failure, no oxygen needed when discharged 10-06-2019;  and positive 06-13-2020 result in epic, pt stated no symptoms   History of seizures    (05-04-2021  pt stated was told during hospital admission 05/ 2021 with covid , she had 2 seizures,  stated no medication and no seizure since)   Hypertension    Insulin dependent type 2 diabetes mellitus (HCC)    endocrinologist-- Fredia Sorrow NP;   pt uses insulin pump   Insulin pump in place    Mixed hyperlipidemia    OSA (obstructive sleep apnea)    ( 05-04-2021 pt stated has not used cpap since 05/ 2021) followed by dr byrum;  study in epic 02-16-2019 moderate , titrated cpap 08-28-2019   Plantar fasciitis, right    w/ chronic pain   Past Surgical History:  Procedure Laterality Date   CESAREAN SECTION  1999   PLANTAR FASCIA RELEASE Right 05/08/2021   Procedure: ENDOSCOPIC PLANTAR FASCIOTOMY;  Surgeon: Asencion Islam, DPM;  Location: [facility];  Service: Podiatry;  Laterality: Right;  WITH BLOCK   STERIOD INJECTION Bilateral 05/08/2021   Procedure: PLATELET RICH PLASMA INJECTION HEELS;  Surgeon: Asencion Islam, DPM;  Location: [facility];  Service: Podiatry;  Laterality: Bilateral;   TUBAL LIGATION Bilateral 09/29/2006   @[facility] ;   [facility]   VENTRAL HERNIA REPAIR N/A 11/27/2017   Procedure: VENTRAL HERNIA REPAIR ERAS PATHWAY;  Surgeon: 11/29/2017, MD;  Location: [facility];  Service: General;  Laterality: N/A;   WISDOM TOOTH EXTRACTION     Patient Active Problem List   Diagnosis Date Noted   Palpitations 07/31/2021   Fatigue 07/31/2021   Primary hypertension 07/31/2021   Hip pain 12/08/2019   Hyperlipidemia associated with type 2 diabetes mellitus (HCC) 11/23/2019   History of COVID-19 10/27/2019   Stuttering 10/27/2019   Depression with anxiety 10/27/2019   Obesity, Class III, BMI 40-49.9 (morbid obesity) (HCC) 06/30/2019   OSA (obstructive sleep apnea) 02/16/2019   Essential hypertension 06/22/2018   Type 2 diabetes mellitus with hyperosmolarity without coma, with long-term current use of insulin (HCC) 12/13/2016   Asthma 07/30/2016   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/07/2016   DM neuropathy, type II diabetes mellitus (HCC) 08/24/2015    REFERRING  DIAG: M72.2 (ICD-10-CM) - Plantar fasciitis, right   THERAPY DIAG:  Pain in right foot  Muscle weakness (generalized)  Difficulty in walking, not elsewhere classified  Localized edema  Rationale for Evaluation and Treatment Rehabilitation  PERTINENT HISTORY: Right ENDOSCOPIC PLANTAR FASCIOTOMY 05/08/2021 History of seizures    SUBJECTIVE: Pt reports her foot continues to hurt and it is swelling on/off.   PAIN:  Are you having pain? Yes: NPRS scale: 10/10 Pain location: Rt calcaneous Pain description: shooting Aggravating factors: walking, standing Relieving factors:  medication   OBJECTIVE: (objective measures completed at initial evaluation unless otherwise dated)   DIAGNOSTIC FINDINGS: No recent imaging    PATIENT SURVEYS:  LEFS 25/80   COGNITION:           Overall cognitive status: Within functional limits for tasks assessed                          SENSATION: WFL   EDEMA:  Figure 8: 60 cm Rt; 57 cm Lt   MUSCLE LENGTH: Not tested   POSTURE: rounded shoulders and forward head In standing: maintains Rt knee in flexion and Rt foot in plantarflexion (will not bear weight through her heel)   PALPATION: Extreme hypersensitivity to light touch about Rt calcaneous and achilles tendon     LOWER EXTREMITY ROM:   Active ROM Right eval Left eval 01/02/22  Hip flexion       Hip extension       Hip abduction       Hip adduction       Hip internal rotation       Hip external rotation       Knee flexion       Knee extension       Ankle dorsiflexion Lacking 10 WNL Lacking 5   Ankle plantarflexion WNL WNL   Ankle inversion 2 WNL   Ankle eversion 6 WNL    (Blank rows = not tested)   LOWER EXTREMITY MMT:   MMT Right eval Left eval  Hip flexion      Hip extension      Hip abduction      Hip adduction      Hip internal rotation      Hip external rotation      Knee flexion      Knee extension      Ankle dorsiflexion 3- 5  Ankle plantarflexion 3+ in sitting 5  Ankle inversion 3- 5  Ankle eversion 3- 5   (Blank rows = not tested)   LOWER EXTREMITY SPECIAL TESTS:  Not tested    FUNCTIONAL TESTS:  Not tested    GAIT: Distance walked: 20 ft Assistive device utilized: None Level of assistance: Complete Independence Comments: maintains foot in plantarflexion during stance on the RLE (will not put heel on the ground)        TODAY'S TREATMENT: [facility] Adult PT Treatment:                                                DATE: 01/02/22 Therapeutic Exercise: Toe flexion AROM 2 x 10 Great toe extension 2 x 10  Weight bearing through  the entire Rt foot in sitting with overpressure multiple trials  Seated ankle dorsiflexion 2 x 10 through partial range focusing on maintaining heel contact on floor  Standing weight shifts focusing on allowing foot flat contact on the RLE multiple trials  Manual Therapy: Open basket weave taping Effleurage to Rt foot  STM to Rt gastroc/soleus  Passive gastroc stretching  Desensitization to Rt foot   [facility] Adult PT Treatment:                                                DATE: 12/19/2021 Therapeutic Exercise: Seated ankle inversion towel slide with 4# dumbbell x2 length of towel on Rt Seated ankle eversion towel slide with 4# dumbbel x2 length of towel on Rt Seated towel toe scrunches x3 length of towel Kickstand stance with front foot on edge of Airex pad 3x30sec with Rt foot forward Long-sitting plantar fascia stretch with sheet x32min on Rt Long-sitting Rt ankle circles 2x30 Manual Therapy: N/A Neuromuscular re-ed: N/A Therapeutic Activity: N/A Modalities: Supine with LE elevated with ice over Rt foot Self Care: N/A            PATIENT EDUCATION:  Education details:continue with HEP Person educated: Patient Education method: Explanation Education comprehension: verbalized understanding     HOME EXERCISE PROGRAM: Access Code: [email or web address] URL: [email or web address] Date: 12/11/2021 Prepared by: Letitia Libra   Exercises - Supine Ankle Pumps  - 3 x daily - 7 x weekly - 2 sets - 10 reps - Seated Heel Toe Raises  - 3 x daily - 7 x weekly - 3 sets - 10 reps - Long Sitting Calf Stretch with Strap  - 3 x daily - 7 x weekly - 3 sets - 30 sec hold - Towel Desensitization  - 3 x daily - 7 x weekly - 5 minutes hold   ASSESSMENT:   CLINICAL IMPRESSION: Patient continues to report high levels of pain about the Rt calcaneous. She was able to tolerate gentle effleurage massage and soft tissue work to the [employer] foot/lower leg tonight, though required distraction via  conversation to allow for improved tolerance to manual work. Session focused on allowing for foot flat contact during sitting and standing exercises as she continues to have difficulty allowing calcaneous to contact any surface due to extreme hypersensitivity. She was able to allow calcaneous to contact the floor for brief periods of time during today's session requiring frequent rest breaks from this position secondary to pain. Her DF AROM has improved compared to baseline, though continues to remain limited.      OBJECTIVE IMPAIRMENTS Abnormal gait, decreased activity tolerance, decreased balance, decreased knowledge of condition, decreased mobility, difficulty walking, decreased ROM, decreased strength, increased edema, increased fascial restrictions, impaired flexibility, impaired sensation, improper body mechanics, postural dysfunction, and pain.    ACTIVITY LIMITATIONS carrying, lifting, bending, standing, squatting, stairs, and locomotion level   PARTICIPATION LIMITATIONS: meal prep, cleaning, laundry, shopping, community activity, and yard work   PERSONAL FACTORS Fitness and Time since onset of injury/illness/exacerbation are also affecting patient's functional outcome.        GOALS: Goals reviewed with patient? No   SHORT TERM GOALS: Target date: 01/02/2022  Patient will be able to achieve foot flat in standing and tolerate for at least 3 minutes to improve her ability to complete household tasks.  Baseline: unable Goal status: ongoing    2.  Patient will be able to put pressure through her heel when laying down to improve sleep quality.  Baseline: unable to tolerate her heel  touching the mat table Goal status: INITIAL   3.  Patient will improve ankle dorsiflexion AROM by at least 5 degrees to improve gait mechanics.  Baseline: lacking 10  Goal status: achieved        LONG TERM GOALS: Target date: 01/23/2022    Patient will be able to achieve foot flat during stance phase of  gait cycle on the RLE.  Baseline: unable Goal status: INITIAL   2.  Patient will demonstrate at least 4-/5 strength in the Rt ankle to improve stability with walking activity.  Baseline: see above Goal status: INITIAL   3.  Patient will improve Rt ankle inversion and eversion AROM by at least 5 degrees to improve gait mechanics.  Baseline: see above Goal status: INITIAL   4. Patient will score at least 35/80 on LEFS to signify clinically meaningful improvement in functional abilities.    Baseline: see above Goal status: INITIAL         PLAN: PT FREQUENCY: 1x/week   PT DURATION: 6 weeks   PLANNED INTERVENTIONS: Therapeutic exercises, Therapeutic activity, Neuromuscular re-education, Balance training, Gait training, Patient/Family education, Joint mobilization, Stair training, Aquatic Therapy, Dry Needling, Electrical stimulation, Cryotherapy, Moist heat, Vasopneumatic device, Ultrasound, Ionotophoresis 4mg /ml Dexamethasone, Manual therapy, and Re-evaluation   PLAN FOR NEXT SESSION: desensitization, calf stretching, gentle ankle mobility, gait training.   , PT, DPT, ATC 01/03/22 8:35 AM

## 2022-01-02 ENCOUNTER — Ambulatory Visit: Payer: Medicaid Other | Attending: Podiatry

## 2022-01-02 DIAGNOSIS — M6281 Muscle weakness (generalized): Secondary | ICD-10-CM | POA: Insufficient documentation

## 2022-01-02 DIAGNOSIS — R600 Localized edema: Secondary | ICD-10-CM | POA: Insufficient documentation

## 2022-01-02 DIAGNOSIS — M79671 Pain in right foot: Secondary | ICD-10-CM | POA: Diagnosis present

## 2022-01-02 DIAGNOSIS — R262 Difficulty in walking, not elsewhere classified: Secondary | ICD-10-CM | POA: Insufficient documentation

## 2022-01-09 ENCOUNTER — Ambulatory Visit (HOSPITAL_BASED_OUTPATIENT_CLINIC_OR_DEPARTMENT_OTHER): Payer: Medicaid Other | Attending: Podiatry | Admitting: Physical Therapy

## 2022-01-09 ENCOUNTER — Encounter (HOSPITAL_BASED_OUTPATIENT_CLINIC_OR_DEPARTMENT_OTHER): Payer: Self-pay | Admitting: Physical Therapy

## 2022-01-09 DIAGNOSIS — R262 Difficulty in walking, not elsewhere classified: Secondary | ICD-10-CM | POA: Insufficient documentation

## 2022-01-09 DIAGNOSIS — M79671 Pain in right foot: Secondary | ICD-10-CM | POA: Insufficient documentation

## 2022-01-09 DIAGNOSIS — M6281 Muscle weakness (generalized): Secondary | ICD-10-CM | POA: Diagnosis present

## 2022-01-09 NOTE — Therapy (Signed)
OUTPATIENT PHYSICAL THERAPY TREATMENT NOTE   Patient Name: Sarah Garrison MRN: [id number] DOB:03/17/1981, 41 y.o., female Today's Date: 01/09/2022  PCP: Maggie Font, FNP REFERRING PROVIDER: Candelaria Stagers, DPM  END OF SESSION:   PT End of Session - 01/09/22 1707     Visit Number 4    Number of Visits 7    Date for PT Re-Evaluation 01/27/22    Authorization Type [employer]    Authorization Time Period 12/12/2021-01/10/2022    Authorization - Visit Number 2    Authorization - Number of Visits 4    PT Start Time 1703    PT Stop Time 1745    PT Time Calculation (min) 42 min    Activity Tolerance Patient limited by pain    Behavior During Therapy [facility] for tasks assessed/performed              Past Medical History:  Diagnosis Date   Anxiety    Asthma    Chronic constipation    Chronic shortness of breath    Depression    GERD (gastroesophageal reflux disease)    History of COVID-19 09/29/2019   09-28-2020 result in epic; hospital admission 10-05-2019 for covid pneumonia with hypoxic acute respiratory failure, no oxygen needed when discharged 10-06-2019;  and positive 06-13-2020 result in epic, pt stated no symptoms   History of seizures    (05-04-2021  pt stated was told during hospital admission 05/ 2021 with covid , she had 2 seizures,  stated no medication and no seizure since)   Hypertension    Insulin dependent type 2 diabetes mellitus (HCC)    endocrinologist-- Fredia Sorrow NP;   pt uses insulin pump   Insulin pump in place    Mixed hyperlipidemia    OSA (obstructive sleep apnea)    ( 05-04-2021 pt stated has not used cpap since 05/ 2021) followed by dr byrum;  study in epic 02-16-2019 moderate , titrated cpap 08-28-2019   Plantar fasciitis, right    w/ chronic pain   Past Surgical History:  Procedure Laterality Date   CESAREAN SECTION  1999   PLANTAR FASCIA RELEASE Right 05/08/2021   Procedure: ENDOSCOPIC PLANTAR FASCIOTOMY;  Surgeon: Asencion Islam, DPM;  Location: [facility];  Service: Podiatry;  Laterality: Right;  WITH BLOCK   STERIOD INJECTION Bilateral 05/08/2021   Procedure: PLATELET RICH PLASMA INJECTION HEELS;  Surgeon: Asencion Islam, DPM;  Location: [facility];  Service: Podiatry;  Laterality: Bilateral;   TUBAL LIGATION Bilateral 09/29/2006   @[facility] ;   PP[facility]   VENTRAL HERNIA REPAIR N/A 11/27/2017   Procedure: VENTRAL HERNIA REPAIR ERAS PATHWAY;  Surgeon: 11/29/2017, MD;  Location: [facility];  Service: General;  Laterality: N/A;   WISDOM TOOTH EXTRACTION     Patient Active Problem List   Diagnosis Date Noted   Palpitations 07/31/2021   Fatigue 07/31/2021   Primary hypertension 07/31/2021   Hip pain 12/08/2019   Hyperlipidemia associated with type 2 diabetes mellitus (HCC) 11/23/2019   History of COVID-19 10/27/2019   Stuttering 10/27/2019   Depression with anxiety 10/27/2019   Obesity, Class III, BMI 40-49.9 (morbid obesity) (HCC) 06/30/2019   OSA (obstructive sleep apnea) 02/16/2019   Essential hypertension 06/22/2018   Type 2 diabetes mellitus with hyperosmolarity without coma, with long-term current use of insulin (HCC) 12/13/2016   Asthma 07/30/2016   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/07/2016   DM neuropathy, type II diabetes mellitus (HCC) 08/24/2015    REFERRING  DIAG: M72.2 (ICD-10-CM) - Plantar fasciitis, right   THERAPY DIAG:  Pain in right foot  Muscle weakness (generalized)  Difficulty in walking, not elsewhere classified  Rationale for Evaluation and Treatment Rehabilitation  PERTINENT HISTORY: Right ENDOSCOPIC PLANTAR FASCIOTOMY 05/08/2021 History of seizures    SUBJECTIVE: "My knee popped out today".   PAIN:  Are you having pain? Yes: NPRS scale: 9/10 Pain location: Rt calcaneous Pain description: shooting Aggravating factors: walking, standing Relieving factors: medication   OBJECTIVE: (objective measures completed at initial evaluation  unless otherwise dated)   DIAGNOSTIC FINDINGS: No recent imaging    PATIENT SURVEYS:  LEFS 25/80   COGNITION:           Overall cognitive status: Within functional limits for tasks assessed                          SENSATION: WFL   EDEMA:  Figure 8: 60 cm Rt; 57 cm Lt   MUSCLE LENGTH: Not tested   POSTURE: rounded shoulders and forward head In standing: maintains Rt knee in flexion and Rt foot in plantarflexion (will not bear weight through her heel)   PALPATION: Extreme hypersensitivity to light touch about Rt calcaneous and achilles tendon     LOWER EXTREMITY ROM:   Active ROM Right eval Left eval 01/02/22  Hip flexion       Hip extension       Hip abduction       Hip adduction       Hip internal rotation       Hip external rotation       Knee flexion       Knee extension       Ankle dorsiflexion Lacking 10 WNL Lacking 5   Ankle plantarflexion WNL WNL   Ankle inversion 2 WNL   Ankle eversion 6 WNL    (Blank rows = not tested)   LOWER EXTREMITY MMT:   MMT Right eval Left eval  Hip flexion      Hip extension      Hip abduction      Hip adduction      Hip internal rotation      Hip external rotation      Knee flexion      Knee extension      Ankle dorsiflexion 3- 5  Ankle plantarflexion 3+ in sitting 5  Ankle inversion 3- 5  Ankle eversion 3- 5   (Blank rows = not tested)   LOWER EXTREMITY SPECIAL TESTS:  Not tested    FUNCTIONAL TESTS:  Not tested    GAIT: Distance walked: 20 ft Assistive device utilized: None Level of assistance: Complete Independence Comments: maintains foot in plantarflexion during stance on the RLE (will not put heel on the ground)        TODAY'S TREATMENT: [facility] Adult PT Treatment:                                                DATE: 01/09/22 Pt seen for aquatic therapy today.  Treatment took place in water 3.25-4.5 ft in depth at the [facility] pool. Temp of water was 91.  Pt entered/exited the pool via  steps hopping with bilateral hand rail. Therapist keeping insulin pump and monitor on WOW close to pt throughout session.  Intro to setting  Walking 3.6 foot with yellow noodle x 6 widths Standing weight shift 3 ft multiple reps cues for foot flat Gait training 3.41ft to 4.5 ue on wall forward, back and side stepping; cues heel strike to toe off. Multiple trials Seated DF/pf 2 x15 reps Standing holding to wall: df; pf (cues for foot flat)   Pt requires the buoyancy and hydrostatic pressure of water for support, and to offload joints by unweighting joint load by at least 50 % in navel deep water and by at least 75-80% in chest to neck deep water.  Viscosity of the water is needed for resistance of strengthening. Water current perturbations provides challenge to standing balance requiring increased core activation.       [facility] Adult PT Treatment:                                                DATE: 01/02/22 Therapeutic Exercise: Toe flexion AROM 2 x 10 Great toe extension 2 x 10  Weight bearing through the entire Rt foot in sitting with overpressure multiple trials  Seated ankle dorsiflexion 2 x 10 through partial range focusing on maintaining heel contact on floor  Standing weight shifts focusing on allowing foot flat contact on the RLE multiple trials  Manual Therapy: Open basket weave taping Effleurage to Rt foot  STM to Rt gastroc/soleus  Passive gastroc stretching  Desensitization to Rt foot   [facility] Adult PT Treatment:                                                DATE: 12/19/2021 Therapeutic Exercise: Seated ankle inversion towel slide with 4# dumbbell x2 length of towel on Rt Seated ankle eversion towel slide with 4# dumbbel x2 length of towel on Rt Seated towel toe scrunches x3 length of towel Kickstand stance with front foot on edge of Airex pad 3x30sec with Rt foot forward Long-sitting plantar fascia stretch with sheet x24min on Rt Long-sitting Rt ankle circles 2x30 Manual  Therapy: N/A Neuromuscular re-ed: N/A Therapeutic Activity: N/A Modalities: Supine with LE elevated with ice over Rt foot Self Care: N/A            PATIENT EDUCATION:  Education details:continue with HEP Person educated: Patient Education method: Explanation Education comprehension: verbalized understanding     HOME EXERCISE PROGRAM: Access Code: [email or web address] URL: [email or web address] Date: 12/11/2021 Prepared by: Letitia Libra   Exercises - Supine Ankle Pumps  - 3 x daily - 7 x weekly - 2 sets - 10 reps - Seated Heel Toe Raises  - 3 x daily - 7 x weekly - 3 sets - 10 reps - Long Sitting Calf Stretch with Strap  - 3 x daily - 7 x weekly - 3 sets - 30 sec hold - Towel Desensitization  - 3 x daily - 7 x weekly - 5 minutes hold   ASSESSMENT:   CLINICAL IMPRESSION: Pt apprehensive prior to entering pool. She demonstrates safety in all depths although is guarded with posture and ambulation throughout. She reports pivoting on right knee earlier today and hearing a pop. Knee is now painful (8/10). Pt with reduction of pain in right foot  from 9/10 to 5/10 upon completing tx. Lambert Mody, stabbing). Tolerates session  fair. Moderate cues needed for heel strike and toe off gait which she appeared to achieve ~50% of time. Pt reports fatigue quickly after session begins.        OBJECTIVE IMPAIRMENTS Abnormal gait, decreased activity tolerance, decreased balance, decreased knowledge of condition, decreased mobility, difficulty walking, decreased ROM, decreased strength, increased edema, increased fascial restrictions, impaired flexibility, impaired sensation, improper body mechanics, postural dysfunction, and pain.    ACTIVITY LIMITATIONS carrying, lifting, bending, standing, squatting, stairs, and locomotion level   PARTICIPATION LIMITATIONS: meal prep, cleaning, laundry, shopping, community activity, and yard work   PERSONAL FACTORS Fitness and Time since onset of  injury/illness/exacerbation are also affecting patient's functional outcome.        GOALS: Goals reviewed with patient? No   SHORT TERM GOALS: Target date: 01/02/2022  Patient will be able to achieve foot flat in standing and tolerate for at least 3 minutes to improve her ability to complete household tasks.  Baseline: unable Goal status: ongoing    2.  Patient will be able to put pressure through her heel when laying down to improve sleep quality.  Baseline: unable to tolerate her heel touching the mat table Goal status: INITIAL   3.  Patient will improve ankle dorsiflexion AROM by at least 5 degrees to improve gait mechanics.  Baseline: lacking 10  Goal status: achieved        LONG TERM GOALS: Target date: 01/23/2022    Patient will be able to achieve foot flat during stance phase of gait cycle on the RLE.  Baseline: unable Goal status: INITIAL   2.  Patient will demonstrate at least 4-/5 strength in the Rt ankle to improve stability with walking activity.  Baseline: see above Goal status: INITIAL   3.  Patient will improve Rt ankle inversion and eversion AROM by at least 5 degrees to improve gait mechanics.  Baseline: see above Goal status: INITIAL   4. Patient will score at least 35/80 on LEFS to signify clinically meaningful improvement in functional abilities.    Baseline: see above Goal status: INITIAL         PLAN: PT FREQUENCY: 1x/week   PT DURATION: 6 weeks   PLANNED INTERVENTIONS: Therapeutic exercises, Therapeutic activity, Neuromuscular re-education, Balance training, Gait training, Patient/Family education, Joint mobilization, Stair training, Aquatic Therapy, Dry Needling, Electrical stimulation, Cryotherapy, Moist heat, Vasopneumatic device, Ultrasound, Ionotophoresis 4mg /ml Dexamethasone, Manual therapy, and Re-evaluation   PLAN FOR NEXT SESSION: desensitization, calf stretching, gentle ankle mobility, gait training.  Corrie Dandy) Jeovany Huitron MPT   01/09/22 5:08 PM

## 2022-01-13 ENCOUNTER — Ambulatory Visit (HOSPITAL_COMMUNITY)
Admission: EM | Admit: 2022-01-13 | Discharge: 2022-01-13 | Disposition: A | Discharge status: Home / Self Care | Payer: Medicaid Other | Attending: Internal Medicine | Admitting: Internal Medicine

## 2022-01-13 DIAGNOSIS — R21 Rash and other nonspecific skin eruption: Secondary | ICD-10-CM | POA: Diagnosis not present

## 2022-01-13 DIAGNOSIS — L509 Urticaria, unspecified: Secondary | ICD-10-CM

## 2022-01-13 MED ORDER — FAMOTIDINE 20 MG PO TABS: 20.0000 mg | ORAL_TABLET | Freq: Two times a day (BID) | ORAL | 0 refills | Status: DC

## 2022-01-13 MED ORDER — TRIAMCINOLONE ACETONIDE 0.1 % EX CREA: 1.0000 | TOPICAL_CREAM | Freq: Two times a day (BID) | CUTANEOUS | 0 refills | Status: DC

## 2022-01-13 NOTE — ED Provider Notes (Signed)
[facility]    CSN: [id number] Arrival date & time: 01/13/22  1001      History   Chief Complaint No chief complaint on file.   HPI Sarah Garrison is a 41 y.o. female.   Patient presents with itchy rash to bilateral arms that started about 5 days ago.  Denies any changes in environment including lotions, soaps, detergents, foods, etc.  Patient reports that she was driving her work vehicle and noticed that there was ants so she is not sure if rash is attributed to this.  She has taken Benadryl with minimal improvement.  Denies any feelings of throat closing or shortness of breath.  Denies any associated fever.     Past Medical History:  Diagnosis Date   Anxiety    Asthma    Chronic constipation    Chronic shortness of breath    Depression    GERD (gastroesophageal reflux disease)    History of COVID-19 09/29/2019   09-28-2020 result in epic; hospital admission 10-05-2019 for covid pneumonia with hypoxic acute respiratory failure, no oxygen needed when discharged 10-06-2019;  and positive 06-13-2020 result in epic, pt stated no symptoms   History of seizures    (05-04-2021  pt stated was told during hospital admission 05/ 2021 with covid , she had 2 seizures,  stated no medication and no seizure since)   Hypertension    Insulin dependent type 2 diabetes mellitus (HCC)    endocrinologist-- Fredia Sorrow NP;   pt uses insulin pump   Insulin pump in place    Mixed hyperlipidemia    OSA (obstructive sleep apnea)    ( 05-04-2021 pt stated has not used cpap since 05/ 2021) followed by dr byrum;  study in epic 02-16-2019 moderate , titrated cpap 08-28-2019   Plantar fasciitis, right    w/ chronic pain    Patient Active Problem List   Diagnosis Date Noted   Palpitations 07/31/2021   Fatigue 07/31/2021   Primary hypertension 07/31/2021   Hip pain 12/08/2019   Hyperlipidemia associated with type 2 diabetes mellitus (HCC) 11/23/2019   History of COVID-19  10/27/2019   Stuttering 10/27/2019   Depression with anxiety 10/27/2019   Obesity, Class III, BMI 40-49.9 (morbid obesity) (HCC) 06/30/2019   OSA (obstructive sleep apnea) 02/16/2019   Essential hypertension 06/22/2018   Type 2 diabetes mellitus with hyperosmolarity without coma, with long-term current use of insulin (HCC) 12/13/2016   Asthma 07/30/2016   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/07/2016   DM neuropathy, type II diabetes mellitus (HCC) 08/24/2015    Past Surgical History:  Procedure Laterality Date   CESAREAN SECTION  1999   PLANTAR FASCIA RELEASE Right 05/08/2021   Procedure: ENDOSCOPIC PLANTAR FASCIOTOMY;  Surgeon: Asencion Islam, DPM;  Location: [facility];  Service: Podiatry;  Laterality: Right;  WITH BLOCK   STERIOD INJECTION Bilateral 05/08/2021   Procedure: PLATELET RICH PLASMA INJECTION HEELS;  Surgeon: Asencion Islam, DPM;  Location: [facility];  Service: Podiatry;  Laterality: Bilateral;   TUBAL LIGATION Bilateral 09/29/2006   @[facility] ;   [facility]   VENTRAL HERNIA REPAIR N/A 11/27/2017   Procedure: VENTRAL HERNIA REPAIR ERAS PATHWAY;  Surgeon: 11/29/2017, MD;  Location: [facility];  Service: General;  Laterality: N/A;   WISDOM TOOTH EXTRACTION      OB History     Gravida  5   Para  4   Term      Preterm      AB  1   Living  4      SAB  1   IAB      Ectopic      Multiple      Live Births               Home Medications    Prior to Admission medications   Medication Sig Start Date End Date Taking? Authorizing Provider  famotidine (PEPCID) 20 MG tablet Take 1 tablet (20 mg total) by mouth 2 (two) times daily. 01/13/22  Yes , Rolly Salter E, FNP  triamcinolone cream (KENALOG) 0.1 % Apply 1 Application topically 2 (two) times daily. 01/13/22  Yes , Rolly Salter E, FNP  acetaminophen (TYLENOL) 500 MG tablet Take 1 tablet (500 mg total) by mouth every 6 (six) hours as needed. 08/03/21   Asencion Islam, DPM   atorvastatin (LIPITOR) 20 MG tablet Take 1 tablet (20 mg total) by mouth once daily. 11/13/21     cyclobenzaprine (FLEXERIL) 10 MG tablet Take 1 tablet (10 mg total) by mouth at bedtime. 08/03/21   Asencion Islam, DPM  diclofenac (FLECTOR) 1.3 % PTCH Place 1 patch onto the skin 2 (two) times daily. 08/03/21   Stover, Cassandria Anger, DPM  fluticasone (FLONASE) 50 MCG/ACT nasal spray Place 2 sprays into both nostrils daily. 12/13/21   Hoy Register, MD  ibuprofen (ADVIL) 800 MG tablet Take 1 tablet (800 mg total) by mouth every 8 (eight) hours as needed. 07/13/21   Lamptey, Britta Mccreedy, MD  insulin aspart (NOVOLOG FLEXPEN) 100 UNIT/ML FlexPen Inject into the skin 3 (three) times daily before meals. ONLY WHEN INSULIN PUMP MALFUNCTION'S PER ENDOCRINOLOGY DIRECTION Patient not taking: Reported on 07/31/2021    [provider]  insulin aspart (NOVOLOG) 100 UNIT/ML injection USE VIA INSULIN PUMP. MAX TOTAL DAILY DOSE OF 200 UNITS 11/13/21     insulin glargine, 1 Unit Dial, (TOUJEO SOLOSTAR) 300 UNIT/ML Solostar Pen Administer 50 units once daily only if insulin pump malfunction Patient not taking: Reported on 05/04/2021 02/24/21     Insulin Human (INSULIN PUMP) SOLN Inject 1 each into the skin See admin instructions. Medication: Novolog 100 units/ml injection. Takes 100 units via pump per patient. Patient not taking: Reported on 06/14/2021    [provider]  Insulin Syringe-Needle U-100 31G X 5/16" 1 ML MISC inject 10 units into the skin 3 times daily. use to inject novolog based on insulin to card ratio- max daily dose 150 units Patient not taking: Reported on 06/14/2021 09/23/20     levocetirizine (XYZAL ALLERGY 24HR) 5 MG tablet Take 1 tablet (5 mg total) by mouth every evening. 12/13/21   Hoy Register, MD  linaclotide (LINZESS) 72 MCG capsule Take 1 capsule (72 mcg total) by mouth daily before breakfast. Patient not taking: Reported on 06/14/2021 04/26/21   Hoy Register, MD  lisinopril (ZESTRIL) 20  MG tablet Take 1 tablet (20 mg total) by mouth once daily. 11/13/21     meclizine (ANTIVERT) 12.5 MG tablet Take 1 tablet (12.5 mg total) by mouth 2 (two) times daily as needed (Motion sickness). 12/13/21   Hoy Register, MD  meloxicam (MOBIC) 7.5 MG tablet Take 1 tablet (7.5 mg total) by mouth 2 (two) times daily with a meal. 08/03/21   Asencion Islam, DPM  Misc. Devices MISC CPAP therapy on autopap 5-15.  Needs Small size [employer]kel Full Face Mask Simplus  mask and heated humidification. Patient not taking: Reported on 06/14/2021 09/13/19   Claiborne Rigg, NP  olopatadine (PATANOL) 0.1 % ophthalmic  solution Place 1 drop into both eyes 2 (two) times daily. 12/13/21   Hoy Register, MD  ondansetron (ZOFRAN ODT) 8 MG disintegrating tablet Take 1 tablet (8 mg total) by mouth every 8 (eight) hours as needed for nausea or vomiting. 02/21/21   Rhys Martini, PA-C  pantoprazole (PROTONIX) 40 MG tablet TAKE 1 TABLET (40 MG TOTAL) BY MOUTH DAILY. 12/13/21   Hoy Register, MD  pregabalin (LYRICA) 75 MG capsule Take 1 capsule (75 mg total) by mouth 2 (two) times daily. 08/03/21   Asencion Islam, DPM  sertraline (ZOLOFT) 100 MG tablet Take 1 tablet (100 mg total) by mouth daily. 12/13/21   Hoy Register, MD  traMADol (ULTRAM) 50 MG tablet Take 1 tablet (50 mg total) by mouth every 6 (six) hours as needed. Patient not taking: Reported on 06/14/2021 05/08/21 05/08/22  Asencion Islam, DPM  Vitamin D, Ergocalciferol, (DRISDOL) 1.25 MG (50000 UNIT) CAPS capsule Take 1 capsule (50,000 Units total) by mouth every 7 (seven) days. 08/01/21   Tobb, Kardie, DO  ARIPiprazole (ABILIFY) 5 MG tablet Take 1 tablet (5 mg total) by mouth daily. 10/27/19 06/13/20  Sater, Pearletha Furl, MD    Family History Family History  Problem Relation Age of Onset   Asthma Mother    Kidney failure Mother    Brain cancer Mother    Asthma Father    Other Father        surgery on stomach but don't know from what   Diabetes Brother     Diabetes Maternal Grandmother     Social History Social History   Tobacco Use   Smoking status: Former    Packs/day: 0.10    Years: 2.00    Total pack years: 0.20    Types: Cigarettes    Quit date: 11/27/2017    Years since quitting: 4.1   Smokeless tobacco: Never  Vaping Use   Vaping Use: Some days   Devices: Elfvar  (05-04-2021 per pt last vaped approx 2 wks ago)  Substance Use Topics   Alcohol use: No   Drug use: Never     Allergies   Morphine and related, Glyburide, Hydrocodone, Ivp dye [iodinated contrast media], and Oxycodone   Review of Systems Review of Systems Per HPI  Physical Exam Triage Vital Signs ED Triage Vitals [01/13/22 1013]  Enc Vitals Group     BP (!) 142/84     Pulse Rate (!) 111     Resp 18     Temp 98.6 F (37 C)     Temp Source Oral     SpO2 98 %     Weight      Height      Head Circumference      Peak Flow      Pain Score      Pain Loc      Pain Edu?      Excl. in GC?    No data found.  Updated Vital Signs BP (!) 142/84 (BP Location: Left Arm)   Pulse (!) 111   Temp 98.6 F (37 C) (Oral)   Resp 18   SpO2 98%   Visual Acuity Right Eye Distance:   Left Eye Distance:   Bilateral Distance:    Right Eye Near:   Left Eye Near:    Bilateral Near:     Physical Exam Constitutional:      General: She is not in acute distress.    Appearance: Normal appearance. She is not toxic-appearing or  diaphoretic.  HENT:     Head: Normocephalic and atraumatic.  Eyes:     Extraocular Movements: Extraocular movements intact.     Conjunctiva/sclera: Conjunctivae normal.  Pulmonary:     Effort: Pulmonary effort is normal.  Skin:    Comments: Patient has maculopapular rash present mainly to antecubital spaces but extends slightly into bilateral forearms and posterior elbow.  No drainage noted.  Neurological:     General: No focal deficit present.     Mental Status: She is alert and oriented to person, place, and time. Mental status is  at baseline.  Psychiatric:        Mood and Affect: Mood normal.        Behavior: Behavior normal.        Thought Content: Thought content normal.        Judgment: Judgment normal.      UC Treatments / Results  Labs (all labs ordered are listed, but only abnormal results are displayed) Labs Reviewed - No data to display  EKG   Radiology No results found.  Procedures Procedures (including critical care time)  Medications Ordered in UC Medications - No data to display  Initial Impression / Assessment and Plan / UC Course  I have reviewed the triage vital signs and the nursing notes.  Pertinent labs & imaging results that were available during my care of the patient were reviewed by me and considered in my medical decision making (see chart for details).     Rash is consistent with contact dermatitis versus insect bites.  Will treat with triamcinolone cream.  No signs of bacterial infection on exam.  Patient has prescription for Xyzal but reports that she is not currently taking it.  Encouraged patient to take this as will be helpful with symptoms.  Will prescribe some Pepcid as well.  Patient advised to follow-up if symptoms persist or worsen.  Patient verbalized understanding and was agreeable with plan. Final Clinical Impressions(s) / UC Diagnoses   Final diagnoses:  Rash and nonspecific skin eruption  Urticaria     Discharge Instructions      You have been prescribed a cream to apply to rash as well as Pepcid which will help alleviate allergic reaction.  Recommend that you start taking your Xyzal that you have at home as well.  Follow-up if symptoms persist or worsen.    ED Prescriptions     Medication Sig Dispense Auth. Provider   famotidine (PEPCID) 20 MG tablet Take 1 tablet (20 mg total) by mouth 2 (two) times daily. 30 tablet Raymond, [place] E, [place]   triamcinolone cream (KENALOG) 0.1 % Apply 1 Application topically 2 (two) times daily. 30 g Gustavus Bryant, [place]       PDMP not reviewed this encounter.   Gustavus Bryant, [place] 01/13/22 1037

## 2022-01-13 NOTE — ED Triage Notes (Signed)
Pt reports rash all over body x 2 days. Pt is taking benadryl.

## 2022-01-13 NOTE — Discharge Instructions (Addendum)
You have been prescribed a cream to apply to rash as well as Pepcid which will help alleviate allergic reaction.  Recommend that you start taking your Xyzal that you have at home as well.  Follow-up if symptoms persist or worsen.

## 2022-01-16 ENCOUNTER — Encounter (HOSPITAL_COMMUNITY): Payer: Self-pay

## 2022-01-16 ENCOUNTER — Other Ambulatory Visit: Payer: Self-pay

## 2022-01-16 ENCOUNTER — Emergency Department (HOSPITAL_COMMUNITY)
Admission: EM | Admit: 2022-01-16 | Discharge: 2022-01-16 | Disposition: A | Discharge status: Home / Self Care | Payer: Medicaid Other | Attending: Emergency Medicine | Admitting: Emergency Medicine

## 2022-01-16 DIAGNOSIS — R21 Rash and other nonspecific skin eruption: Secondary | ICD-10-CM | POA: Diagnosis present

## 2022-01-16 DIAGNOSIS — Z794 Long term (current) use of insulin: Secondary | ICD-10-CM | POA: Diagnosis not present

## 2022-01-16 DIAGNOSIS — R000 Tachycardia, unspecified: Secondary | ICD-10-CM | POA: Insufficient documentation

## 2022-01-16 MED ORDER — PREDNISONE 20 MG PO TABS: 20.0000 mg | ORAL_TABLET | Freq: Every day | ORAL | 0 refills | Status: AC

## 2022-01-16 MED ORDER — HYDROXYZINE HCL 25 MG PO TABS: 25.0000 mg | ORAL_TABLET | Freq: Three times a day (TID) | ORAL | 0 refills | Status: DC | PRN

## 2022-01-16 MED ORDER — HYDROCORTISONE 2.5 % EX LOTN: TOPICAL_LOTION | Freq: Two times a day (BID) | CUTANEOUS | 0 refills | Status: DC

## 2022-01-16 NOTE — Discharge Instructions (Signed)
Exam today was overall reassuring.  No concerning features concerning for a serious cause of your rash.  I have sent a higher strength steroid cream into the pharmacy for you.  Have also sent hydroxyzine to help with the itching.  You can use the hydroxyzine as needed.  Please for low up with your primary care provider.  If you have any new or worsening symptoms please return to the emergency room.

## 2022-01-16 NOTE — ED Notes (Signed)
I provided reinforced discharge education based off of after visit summary/care provided. Pt acknowledged and understood my education. Pt had no further questions/concerns for provider/myself. After visit summary provided to pt. 

## 2022-01-16 NOTE — ED Triage Notes (Signed)
Pt reports going to UC for rash to arms and legs on 8/12. Pt reports using steroid cream without relief and reports rash continues to spread.
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
## 2022-01-18 ENCOUNTER — Other Ambulatory Visit: Payer: Self-pay

## 2022-01-19 ENCOUNTER — Other Ambulatory Visit: Payer: Self-pay

## 2022-01-19 ENCOUNTER — Ambulatory Visit: Payer: Medicaid Other | Admitting: Podiatry

## 2022-01-19 DIAGNOSIS — M722 Plantar fascial fibromatosis: Secondary | ICD-10-CM

## 2022-01-19 NOTE — Progress Notes (Signed)
Subjective:  Patient ID: Sarah Garrison, female    DOB: [birth year],  MRN: [id number]  Chief Complaint  Patient presents with   Foot Problem    8 WEEK FOLLOW ON RIGHT FOOT AFTER PT, AQUATIC THERAPY CAUSED A BREAKOUT ON THE BODY.     DOS: 05/08/2021 Procedure: Right EPF  42 y.o. female returns for post-op check.  Patient states she is doing okay.  She still has some pain.  She states that she is a diabetic with A1c of 7.7.  She had surgery done by Dr. Marylene Land.  She wanted to discuss if there is anything else that could be done.  She denies any other acute complaints  Review of Systems: Negative except as noted in the HPI. Denies N/V/F/Ch.  Past Medical History:  Diagnosis Date   Anxiety    Asthma    Chronic constipation    Chronic shortness of breath    Depression    GERD (gastroesophageal reflux disease)    History of COVID-19 09/29/2019   09-28-2020 result in epic; hospital admission 10-05-2019 for covid pneumonia with hypoxic acute respiratory failure, no oxygen needed when discharged 10-06-2019;  and positive 06-13-2020 result in epic, pt stated no symptoms   History of seizures    (05-04-2021  pt stated was told during hospital admission 05/ 2021 with covid , she had 2 seizures,  stated no medication and no seizure since)   Hypertension    Insulin dependent type 2 diabetes mellitus [facility])    endocrinologist-- Fredia Sorrow NP;   pt uses insulin pump   Insulin pump in place    Mixed hyperlipidemia    OSA (obstructive sleep apnea)    ( 05-04-2021 pt stated has not used cpap since 05/ 2021) followed by dr byrum;  study in epic 02-16-2019 moderate , titrated cpap 08-28-2019   Plantar fasciitis, right    w/ chronic pain    Current Outpatient Medications:    acetaminophen (TYLENOL) 500 MG tablet, Take 1 tablet (500 mg total) by mouth every 6 (six) hours as needed., Disp: 30 tablet, Rfl: 0   atorvastatin (LIPITOR) 20 MG tablet, Take 1 tablet (20 mg total) by mouth once daily.,  Disp: 90 tablet, Rfl: 1   cyclobenzaprine (FLEXERIL) 10 MG tablet, Take 1 tablet (10 mg total) by mouth at bedtime., Disp: 30 tablet, Rfl: 0   diclofenac (FLECTOR) 1.3 % PTCH, Place 1 patch onto the skin 2 (two) times daily., Disp: 60 patch, Rfl: 1   famotidine (PEPCID) 20 MG tablet, Take 1 tablet (20 mg total) by mouth 2 (two) times daily., Disp: 30 tablet, Rfl: 0   fluticasone (FLONASE) 50 MCG/ACT nasal spray, Place 2 sprays into both nostrils daily., Disp: 16 g, Rfl: 1   hydrocortisone 2.5 % lotion, Apply topically 2 (two) times daily., Disp: 59 mL, Rfl: 0   hydrOXYzine (ATARAX) 25 MG tablet, Take 1 tablet (25 mg total) by mouth every 8 (eight) hours as needed for itching., Disp: 20 tablet, Rfl: 0   ibuprofen (ADVIL) 800 MG tablet, Take 1 tablet (800 mg total) by mouth every 8 (eight) hours as needed., Disp: 30 tablet, Rfl: 0   insulin aspart (NOVOLOG FLEXPEN) 100 UNIT/ML FlexPen, Inject into the skin 3 (three) times daily before meals. ONLY WHEN INSULIN PUMP MALFUNCTION'S PER ENDOCRINOLOGY DIRECTION, Disp: , Rfl:    insulin aspart (NOVOLOG) 100 UNIT/ML injection, USE VIA INSULIN PUMP. MAX TOTAL DAILY DOSE OF 200 UNITS, Disp: 90 mL, Rfl: 5   insulin glargine, 1  Unit Dial, (TOUJEO SOLOSTAR) 300 UNIT/ML Solostar Pen, Administer 50 units once daily only if insulin pump malfunction, Disp: 9 mL, Rfl: 1   Insulin Human (INSULIN PUMP) SOLN, Inject 1 each into the skin See admin instructions. Medication: Novolog 100 units/ml injection. Takes 100 units via pump per patient., Disp: , Rfl:    Insulin Syringe-Needle U-100 31G X 5/16" 1 ML MISC, inject 10 units into the skin 3 times daily. use to inject novolog based on insulin to card ratio- max daily dose 150 units, Disp: 100 each, Rfl: 2   levocetirizine (XYZAL ALLERGY 24HR) 5 MG tablet, Take 1 tablet (5 mg total) by mouth every evening., Disp: 30 tablet, Rfl: 3   linaclotide (LINZESS) 72 MCG capsule, Take 1 capsule (72 mcg total) by mouth daily before  breakfast., Disp: 30 capsule, Rfl: 3   lisinopril (ZESTRIL) 20 MG tablet, Take 1 tablet (20 mg total) by mouth once daily., Disp: 90 tablet, Rfl: 2   meclizine (ANTIVERT) 12.5 MG tablet, Take 1 tablet (12.5 mg total) by mouth 2 (two) times daily as needed (Motion sickness)., Disp: 30 tablet, Rfl: 0   meloxicam (MOBIC) 7.5 MG tablet, Take 1 tablet (7.5 mg total) by mouth 2 (two) times daily with a meal., Disp: 15 tablet, Rfl: 0   Misc. Devices MISC, CPAP therapy on autopap 5-15.  Needs Small size Fisher&Paykel Full Face Mask Simplus  mask and heated humidification., Disp: 1 each, Rfl: 0   olopatadine (PATANOL) 0.1 % ophthalmic solution, Place 1 drop into both eyes 2 (two) times daily., Disp: 5 mL, Rfl: 1   ondansetron (ZOFRAN ODT) 8 MG disintegrating tablet, Take 1 tablet (8 mg total) by mouth every 8 (eight) hours as needed for nausea or vomiting., Disp: 20 tablet, Rfl: 0   pantoprazole (PROTONIX) 40 MG tablet, TAKE 1 TABLET (40 MG TOTAL) BY MOUTH DAILY., Disp: 90 tablet, Rfl: 1   predniSONE (DELTASONE) 20 MG tablet, Take 1 tablet (20 mg total) by mouth daily with breakfast for 5 days., Disp: 5 tablet, Rfl: 0   pregabalin (LYRICA) 75 MG capsule, Take 1 capsule (75 mg total) by mouth 2 (two) times daily., Disp: 90 capsule, Rfl: 1   sertraline (ZOLOFT) 100 MG tablet, Take 1 tablet (100 mg total) by mouth daily., Disp: 90 tablet, Rfl: 1   traMADol (ULTRAM) 50 MG tablet, Take 1 tablet (50 mg total) by mouth every 6 (six) hours as needed., Disp: 20 tablet, Rfl: 0   triamcinolone cream (KENALOG) 0.1 %, Apply 1 Application topically 2 (two) times daily., Disp: 30 g, Rfl: 0   Vitamin D, Ergocalciferol, (DRISDOL) 1.25 MG (50000 UNIT) CAPS capsule, Take 1 capsule (50,000 Units total) by mouth every 7 (seven) days., Disp: 12 capsule, Rfl: 0  Social History   Tobacco Use  Smoking Status Former   Packs/day: 0.10   Years: 2.00   Total pack years: 0.20   Types: Cigarettes   Quit date: 11/27/2017   Years  since quitting: 4.1  Smokeless Tobacco Never    Allergies  Allergen Reactions   Morphine And Related Shortness Of Breath   Glyburide Diarrhea and Other (See Comments)    Reaction:  Nose bleeds    Hydrocodone Other (See Comments)    Pt states that this medication makes her "dream about rabbits chasing her"   Ivp Dye [Iodinated Contrast Media] Other (See Comments)    Shortness of breath.     Oxycodone Other (See Comments)    Pt states that this medication  makes her "dream about rabbits chasing" her.     Objective:  There were no vitals filed for this visit. There is no height or weight on file to calculate BMI. Constitutional Well developed. Well nourished.  Vascular Foot warm and well perfused. Capillary refill normal to all digits.   Neurologic Normal speech. Oriented to person, place, and time. Epicritic sensation to light touch grossly present bilaterally.  Dermatologic Skin completely reepithelialized.  Reduction of tightness of plantar fascia noted  Orthopedic: Tenderness to palpation noted about the surgical site.   Radiographs: None Assessment:   No diagnosis found.  Plan:  Patient was evaluated and treated and all questions answered.  S/p foot surgery right -Progressing as expected post-operatively. -Continue doing physical therapy.  I will place an order for an MRI.  No follow-ups on file.

## 2022-02-01 ENCOUNTER — Ambulatory Visit: Payer: Medicaid Other

## 2022-02-01 DIAGNOSIS — M79671 Pain in right foot: Secondary | ICD-10-CM | POA: Diagnosis not present

## 2022-02-01 DIAGNOSIS — M6281 Muscle weakness (generalized): Secondary | ICD-10-CM

## 2022-02-01 DIAGNOSIS — R600 Localized edema: Secondary | ICD-10-CM

## 2022-02-01 DIAGNOSIS — R262 Difficulty in walking, not elsewhere classified: Secondary | ICD-10-CM

## 2022-02-01 NOTE — Therapy (Addendum)
OUTPATIENT PHYSICAL THERAPY TREATMENT NOTE/RE-CERTIFICATION  PHYSICAL THERAPY DISCHARGE SUMMARY  Visits from Start of Care: 5  Current functional level related to goals / functional outcomes: See goals below   Remaining deficits: Status unknown   Education / Equipment: N/A   Patient agrees to discharge. Patient goals were partially met. Patient is being discharged due to not returning since the last visit.    Patient Name: Sarah Garrison MRN: [id number] DOB:1980-10-01, 41 y.o., female Today's Date: 02/01/2022  PCP: Darrold Junker, FNP REFERRING PROVIDER: Felipa Furnace, DPM  END OF SESSION:   PT End of Session - 02/01/22 1612     Visit Number 5    Number of Visits 11    Date for PT Re-Evaluation 03/17/22    Authorization Type [employer]- requesting auth    PT Start Time 1615    PT Stop Time 1657    PT Time Calculation (min) 42 min    Activity Tolerance Patient limited by pain    Behavior During Therapy [facility] for tasks assessed/performed               Past Medical History:  Diagnosis Date   Anxiety    Asthma    Chronic constipation    Chronic shortness of breath    Depression    GERD (gastroesophageal reflux disease)    History of COVID-19 09/29/2019   09-28-2020 result in epic; hospital admission 10-05-2019 for covid pneumonia with hypoxic acute respiratory failure, no oxygen needed when discharged 10-06-2019;  and positive 06-13-2020 result in epic, pt stated no symptoms   History of seizures    (05-04-2021  pt stated was told during hospital admission 05/ 2021 with covid , she had 2 seizures,  stated no medication and no seizure since)   Hypertension    Insulin dependent type 2 diabetes mellitus ([place])    endocrinologist-- Jacolyn Reedy NP;   pt uses insulin pump   Insulin pump in place    Mixed hyperlipidemia    OSA (obstructive sleep apnea)    ( 05-04-2021 pt stated has not used cpap since 05/ 2021) followed by dr byrum;  study in epic  02-16-2019 moderate , titrated cpap 08-28-2019   Plantar fasciitis, right    w/ chronic pain   Past Surgical History:  Procedure Laterality Date   [place] Right 05/08/2021   Procedure: ENDOSCOPIC PLANTAR FASCIOTOMY;  Surgeon: Landis Martins, DPM;  Location: [place];  Service: Podiatry;  Laterality: Right;  WITH BLOCK   STERIOD INJECTION Bilateral 05/08/2021   Procedure: PLATELET RICH PLASMA INJECTION HEELS;  Surgeon: Landis Martins, DPM;  Location: [place];  Service: Podiatry;  Laterality: Bilateral;   TUBAL LIGATION Bilateral 09/29/2006   @[facility] ;   [facility]   VENTRAL HERNIA REPAIR N/A 11/27/2017   Procedure: VENTRAL HERNIA REPAIR ERAS PATHWAY;  Surgeon: Erroll Luna, MD;  Location: [place];  Service: General;  Laterality: N/A;   [place] EXTRACTION     Patient Active Problem List   Diagnosis Date Noted   Palpitations 07/31/2021   Fatigue 07/31/2021   Primary hypertension 07/31/2021   Hip pain 12/08/2019   Hyperlipidemia associated with type 2 diabetes mellitus ([place]) 11/23/2019   History of COVID-19 10/27/2019   Stuttering 10/27/2019   Depression with anxiety 10/27/2019   Obesity, Class III, BMI 40-49.9 (morbid obesity) ([place]) 06/30/2019   OSA (obstructive sleep apnea) 02/16/2019   Essential hypertension 06/22/2018  Type 2 diabetes mellitus with hyperosmolarity without coma, with long-term current use of insulin ([place]) 12/13/2016   Asthma 07/30/2016   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/07/2016   DM neuropathy, type II diabetes mellitus ([place]) 08/24/2015    REFERRING DIAG: M72.2 (ICD-10-CM) - Plantar fasciitis, right   THERAPY DIAG:  Pain in right foot  Muscle weakness (generalized)  Difficulty in walking, not elsewhere classified  Localized edema  Rationale for Evaluation and Treatment Rehabilitation  PERTINENT HISTORY: Right ENDOSCOPIC PLANTAR FASCIOTOMY 05/08/2021 History of seizures     SUBJECTIVE: Patient reports the swelling has gone down a little. She reports she is still unable to walk on her heel due to pain. She had recent f/u with physician who has sent an order in for an MRI. She had her aquatic session, but had an allergic reaction from the chlorine so does not want to continue with aquatics.  PAIN:  Are you having pain? Yes: NPRS scale: 3/10 Pain location: Rt calcaneous Pain description: shooting Aggravating factors: walking, standing Relieving factors: medication   OBJECTIVE: (objective measures completed at initial evaluation unless otherwise dated)   DIAGNOSTIC FINDINGS: No recent imaging    PATIENT SURVEYS:  LEFS 25/80 02/01/22: 33/80   COGNITION:           Overall cognitive status: Within functional limits for tasks assessed                          SENSATION: WFL   EDEMA:  Figure 8: 60 cm Rt; 57 cm Lt   MUSCLE LENGTH: Not tested   POSTURE: rounded shoulders and forward head In standing: maintains Rt knee in flexion and Rt foot in plantarflexion (will not bear weight through her heel)  02/01/22: In standing: maintains Rt knee in flexion and Rt foot in plantarflexion (will not bear weight through her heel independently)   PALPATION: Extreme hypersensitivity to light touch about Rt calcaneous and achilles tendon  02/01/22: Extreme hypersensitivity to light touch about Rt calcaneous and achilles tendon     LOWER EXTREMITY ROM:   Active ROM Right eval Left eval 01/02/22 02/01/22 Right  Hip flexion        Hip extension        Hip abduction        Hip adduction        Hip internal rotation        Hip external rotation        Knee flexion        Knee extension        Ankle dorsiflexion Lacking 10 WNL Lacking 5  Lacking 4   Ankle plantarflexion WNL WNL  WNL  Ankle inversion 2 WNL  15  Ankle eversion 6 WNL  11   (Blank rows = not tested)   LOWER EXTREMITY MMT:   MMT Right eval Left eval 02/01/22 Right   Hip flexion       Hip  extension       Hip abduction       Hip adduction       Hip internal rotation       Hip external rotation       Knee flexion       Knee extension       Ankle dorsiflexion 3- 5 3+/5 pn  Ankle plantarflexion 3+ in sitting 5 4/5 in sitting   Ankle inversion 3- 5 5/5  Ankle eversion 3- 5 5/5   (Blank rows = not tested)  LOWER EXTREMITY SPECIAL TESTS:  Not tested    FUNCTIONAL TESTS:  Not tested    GAIT: Distance walked: 20 ft Assistive device utilized: None Level of assistance: Complete Independence Comments: maintains foot in plantarflexion during stance on the RLE (will not put heel on the ground)      02/01/22: Independent, maintains foot in plantarflexion during stance on the RLE      TODAY'S TREATMENT: [facility] Adult PT Treatment:                                                DATE: 02/01/22 Therapeutic Exercise: Standing weight shifts focusing on allowing foot flat contact on the RLE multiple trials  Weight bearing through the entire Rt foot in standing with overpressure multiple trials  Rt SLS with therapist assistance to maintain foot flat multiple trials Forward step overs towel x 10 heel strike, x 10 heel strike to foot flat, x 10 heel strike to foot flat to weight bearing  Manual Therapy: Effleurage to Rt foot  STM to Rt gastroc/soleus  Passive gastroc stretching  Desensitization to Rt foot  Demonstrated and issued tennis ball for self soft tissue mobilization of plantarfascia  Therapeutic Activity: Re-assessment to determine overall progress educating patient on progress towards goals and deficits that will continue to be addressed throughout [place] Adult PT Treatment:                                                DATE: 01/02/22 Therapeutic Exercise: Toe flexion AROM 2 x 10 Great toe extension 2 x 10  Weight bearing through the entire Rt foot in sitting with overpressure multiple trials  Seated ankle dorsiflexion 2 x 10 through partial range focusing on  maintaining heel contact on floor  Standing weight shifts focusing on allowing foot flat contact on the RLE multiple trials  Manual Therapy: Open basket weave taping Effleurage to Rt foot  STM to Rt gastroc/soleus  Passive gastroc stretching  Desensitization to Rt foot   [facility] Adult PT Treatment:                                                DATE: 12/19/2021 Therapeutic Exercise: Seated ankle inversion towel slide with 4# dumbbell x2 length of towel on Rt Seated ankle eversion towel slide with 4# dumbbel x2 length of towel on Rt Seated towel toe scrunches x3 length of towel Kickstand stance with front foot on edge of Airex pad 3x30sec with Rt foot forward Long-sitting plantar fascia stretch with sheet x85mn on Rt Long-sitting Rt ankle circles 2x30 Manual Therapy: N/A Neuromuscular re-ed: N/A Therapeutic Activity: N/A Modalities: Supine with LE elevated with ice over Rt foot Self Care: N/A            PATIENT EDUCATION:  Education details:see treatment  Person educated: Patient Education method: Explanation, demo Education comprehension: verbalized understanding, returned demo     HOME EXERCISE PROGRAM: Access Code: [email or web address]: [email or web address] Date: 12/11/2021 Prepared by: SGwendolyn Grant  Exercises - Supine Ankle Pumps  - 3 x daily - 7 x weekly -  2 sets - 10 reps - Seated Heel Toe Raises  - 3 x daily - 7 x weekly - 3 sets - 10 reps - Long Sitting Calf Stretch with Strap  - 3 x daily - 7 x weekly - 3 sets - 30 sec hold - Towel Desensitization  - 3 x daily - 7 x weekly - 5 minutes hold   ASSESSMENT:   CLINICAL IMPRESSION: Patient has attended 2 land PT visits and 1 aquatic PT visit since her initial evaluation on 12/11/21 for chronic Rt heel pain that has been ongoing since Rt plantar fasciotomy on 05/08/21. She continues to have extreme hypersensitivity to light touch about the calcaneous and achilles tendon and inability to bear weight through  her heel independently secondary to pain. She is able to tolerate pressure on her heel when laying down and is able to tolerate foot flat in standing for about 1 minute with therapist overpressure about the heel to allow for foot flat contact. This severe pain continues to cause an antagic gait and has resulted in shortening of her gastrocnemius. She demonstrates improvements in her ankle ROM and strength, though continues to have limited DF AROM as well as plantaflexor and dorsiflexor weakness. She will benefit from consistent skilled PT 1xweek for 6 weeks to address her lingering ROM, strength, gait, and balance impairments in order to optimize her function and reduce her pain.      OBJECTIVE IMPAIRMENTS Abnormal gait, decreased activity tolerance, decreased balance, decreased knowledge of condition, decreased mobility, difficulty walking, decreased ROM, decreased strength, increased edema, increased fascial restrictions, impaired flexibility, impaired sensation, improper body mechanics, postural dysfunction, and pain.    ACTIVITY LIMITATIONS carrying, lifting, bending, standing, squatting, stairs, and locomotion level   PARTICIPATION LIMITATIONS: meal prep, cleaning, laundry, shopping, community activity, and yard work   [place] and Time since onset of injury/illness/exacerbation are also affecting patient's functional outcome.        GOALS: Goals reviewed with patient? No   SHORT TERM GOALS: Target date: 01/02/2022  Patient will be able to achieve foot flat in standing and tolerate for at least 3 minutes to improve her ability to complete household tasks.  Baseline: unable Status: 02/01/22: can tolerate 1 minute of foot flat with therapist assist Goal status: ongoing    2.  Patient will be able to put pressure through her heel when laying down to improve sleep quality.  Baseline: unable to tolerate her heel touching the mat table Status: 02/01/22 able to place heel on mat  table  Goal status: met   3.  Patient will improve ankle dorsiflexion AROM by at least 5 degrees to improve gait mechanics.  Baseline: lacking 10  Goal status: met        LONG TERM GOALS: Target date: 01/23/2022    Patient will be able to achieve foot flat during stance phase of gait cycle on the RLE.  Baseline: unable Goal status: ongoing    2.  Patient will demonstrate at least 4-/5 strength in the Rt ankle to improve stability with walking activity.  Baseline: see above Goal status: partially met    3.  Patient will improve Rt ankle inversion and eversion AROM by at least 5 degrees to improve gait mechanics.  Baseline: see above Goal status: met    4. Patient will score at least 35/80 on LEFS to signify clinically meaningful improvement in functional abilities.    Baseline: see above Goal status: nearly met  PLAN: PT FREQUENCY: 1x/week   PT DURATION: 6 weeks   PLANNED INTERVENTIONS: Therapeutic exercises, Therapeutic activity, Neuromuscular re-education, Balance training, Gait training, Patient/Family education, Joint mobilization, Stair training, Aquatic Therapy, Dry Needling, Electrical stimulation, Cryotherapy, Moist heat, Vasopneumatic device, Ultrasound, Ionotophoresis 43m/ml Dexamethasone, Manual therapy, and Re-evaluation   PLAN FOR NEXT SESSION: desensitization, calf stretching, gentle ankle mobility, gait training.   SGwendolyn Grant PT, DPT, ATC 02/01/22 5:28 PM  SGwendolyn Grant PT, DPT, ATC 03/05/22 8:31 AM

## 2022-02-02 ENCOUNTER — Ambulatory Visit
Admission: RE | Admit: 2022-02-02 | Discharge: 2022-02-02 | Disposition: A | Discharge status: Home / Self Care | Payer: Medicaid Other | Source: Ambulatory Visit | Attending: Podiatry | Admitting: Podiatry

## 2022-02-02 DIAGNOSIS — M722 Plantar fascial fibromatosis: Secondary | ICD-10-CM

## 2022-02-27 ENCOUNTER — Ambulatory Visit: Payer: Medicaid Other | Attending: Family Medicine

## 2022-02-27 DIAGNOSIS — Z3042 Encounter for surveillance of injectable contraceptive: Secondary | ICD-10-CM

## 2022-02-27 DIAGNOSIS — Z20822 Contact with and (suspected) exposure to covid-19: Secondary | ICD-10-CM

## 2022-02-27 MED ORDER — MEDROXYPROGESTERONE ACETATE 150 MG/ML IM SUSP
150.0000 mg | Freq: Once | INTRAMUSCULAR | Status: AC
  Administered 2022-02-27: 150 mg via INTRAMUSCULAR

## 2022-02-27 NOTE — Progress Notes (Unsigned)
Pt was given depo injection in left ventragluteal. Pt tolerated shot well Urine HCG needed: Pt to return DEC 12-DEC 26

## 2022-02-28 LAB — NOVEL CORONAVIRUS, NAA: SARS-CoV-2, NAA: NOT DETECTED

## 2022-03-02 ENCOUNTER — Ambulatory Visit: Payer: Medicaid Other | Admitting: Podiatry

## 2022-03-02 DIAGNOSIS — M79671 Pain in right foot: Secondary | ICD-10-CM

## 2022-03-02 DIAGNOSIS — M722 Plantar fascial fibromatosis: Secondary | ICD-10-CM | POA: Diagnosis not present

## 2022-03-02 NOTE — Progress Notes (Signed)
Subjective:  Patient ID: Sarah Garrison, female    DOB: 02/09/1981,  MRN: [id number]  Chief Complaint  Patient presents with   Plantar Fasciitis    DOS: 05/08/2021 Procedure: Right EPF  41 y.o. female returns for post-op check.  Patient states she is doing okay.  She still has some pain.  She states that she is a diabetic with A1c of 7.7.  She had surgery done by Dr. Marylene Land.  She states that she is managing her pain better now.  She denies any other acute complaints.  She is doing physical therapy  Review of Systems: Negative except as noted in the HPI. Denies N/V/F/Ch.  Past Medical History:  Diagnosis Date   Anxiety    Asthma    Chronic constipation    Chronic shortness of breath    Depression    GERD (gastroesophageal reflux disease)    History of COVID-19 09/29/2019   09-28-2020 result in epic; hospital admission 10-05-2019 for covid pneumonia with hypoxic acute respiratory failure, no oxygen needed when discharged 10-06-2019;  and positive 06-13-2020 result in epic, pt stated no symptoms   History of seizures    (05-04-2021  pt stated was told during hospital admission 05/ 2021 with covid , she had 2 seizures,  stated no medication and no seizure since)   Hypertension    Insulin dependent type 2 diabetes mellitus [facility])    endocrinologist-- Fredia Sorrow NP;   pt uses insulin pump   Insulin pump in place    Mixed hyperlipidemia    OSA (obstructive sleep apnea)    ( 05-04-2021 pt stated has not used cpap since 05/ 2021) followed by dr byrum;  study in epic 02-16-2019 moderate , titrated cpap 08-28-2019   Plantar fasciitis, right    w/ chronic pain    Current Outpatient Medications:    acetaminophen (TYLENOL) 500 MG tablet, Take 1 tablet (500 mg total) by mouth every 6 (six) hours as needed., Disp: 30 tablet, Rfl: 0   atorvastatin (LIPITOR) 20 MG tablet, Take 1 tablet (20 mg total) by mouth once daily., Disp: 90 tablet, Rfl: 1   cyclobenzaprine (FLEXERIL) 10 MG tablet,  Take 1 tablet (10 mg total) by mouth at bedtime., Disp: 30 tablet, Rfl: 0   diclofenac (FLECTOR) 1.3 % PTCH, Place 1 patch onto the skin 2 (two) times daily., Disp: 60 patch, Rfl: 1   famotidine (PEPCID) 20 MG tablet, Take 1 tablet (20 mg total) by mouth 2 (two) times daily., Disp: 30 tablet, Rfl: 0   fluticasone (FLONASE) 50 MCG/ACT nasal spray, Place 2 sprays into both nostrils daily., Disp: 16 g, Rfl: 1   hydrocortisone 2.5 % lotion, Apply topically 2 (two) times daily., Disp: 59 mL, Rfl: 0   hydrOXYzine (ATARAX) 25 MG tablet, Take 1 tablet (25 mg total) by mouth every 8 (eight) hours as needed for itching., Disp: 20 tablet, Rfl: 0   ibuprofen (ADVIL) 800 MG tablet, Take 1 tablet (800 mg total) by mouth every 8 (eight) hours as needed., Disp: 30 tablet, Rfl: 0   insulin aspart (NOVOLOG FLEXPEN) 100 UNIT/ML FlexPen, Inject into the skin 3 (three) times daily before meals. ONLY WHEN INSULIN PUMP MALFUNCTION'S PER ENDOCRINOLOGY DIRECTION, Disp: , Rfl:    insulin aspart (NOVOLOG) 100 UNIT/ML injection, USE VIA INSULIN PUMP. MAX TOTAL DAILY DOSE OF 200 UNITS, Disp: 90 mL, Rfl: 5   insulin glargine, 1 Unit Dial, (TOUJEO SOLOSTAR) 300 UNIT/ML Solostar Pen, Administer 50 units once daily only if insulin pump  malfunction, Disp: 9 mL, Rfl: 1   Insulin Human (INSULIN PUMP) SOLN, Inject 1 each into the skin See admin instructions. Medication: Novolog 100 units/ml injection. Takes 100 units via pump per patient., Disp: , Rfl:    Insulin Syringe-Needle U-100 31G X 5/16" 1 ML MISC, inject 10 units into the skin 3 times daily. use to inject novolog based on insulin to card ratio- max daily dose 150 units, Disp: 100 each, Rfl: 2   levocetirizine (XYZAL ALLERGY 24HR) 5 MG tablet, Take 1 tablet (5 mg total) by mouth every evening., Disp: 30 tablet, Rfl: 3   linaclotide (LINZESS) 72 MCG capsule, Take 1 capsule (72 mcg total) by mouth daily before breakfast., Disp: 30 capsule, Rfl: 3   lisinopril (ZESTRIL) 20 MG tablet,  Take 1 tablet (20 mg total) by mouth once daily., Disp: 90 tablet, Rfl: 2   meclizine (ANTIVERT) 12.5 MG tablet, Take 1 tablet (12.5 mg total) by mouth 2 (two) times daily as needed (Motion sickness)., Disp: 30 tablet, Rfl: 0   meloxicam (MOBIC) 7.5 MG tablet, Take 1 tablet (7.5 mg total) by mouth 2 (two) times daily with a meal., Disp: 15 tablet, Rfl: 0   Misc. Devices MISC, CPAP therapy on autopap 5-15.  Needs Small size [employer]kel Full Face Mask Simplus  mask and heated humidification., Disp: 1 each, Rfl: 0   olopatadine (PATANOL) 0.1 % ophthalmic solution, Place 1 drop into both eyes 2 (two) times daily., Disp: 5 mL, Rfl: 1   ondansetron (ZOFRAN ODT) 8 MG disintegrating tablet, Take 1 tablet (8 mg total) by mouth every 8 (eight) hours as needed for nausea or vomiting., Disp: 20 tablet, Rfl: 0   pantoprazole (PROTONIX) 40 MG tablet, TAKE 1 TABLET (40 MG TOTAL) BY MOUTH DAILY., Disp: 90 tablet, Rfl: 1   pregabalin (LYRICA) 75 MG capsule, Take 1 capsule (75 mg total) by mouth 2 (two) times daily., Disp: 90 capsule, Rfl: 1   sertraline (ZOLOFT) 100 MG tablet, Take 1 tablet (100 mg total) by mouth daily., Disp: 90 tablet, Rfl: 1   traMADol (ULTRAM) 50 MG tablet, Take 1 tablet (50 mg total) by mouth every 6 (six) hours as needed., Disp: 20 tablet, Rfl: 0   triamcinolone cream (KENALOG) 0.1 %, Apply 1 Application topically 2 (two) times daily., Disp: 30 g, Rfl: 0   Vitamin D, Ergocalciferol, (DRISDOL) 1.25 MG (50000 UNIT) CAPS capsule, Take 1 capsule (50,000 Units total) by mouth every 7 (seven) days., Disp: 12 capsule, Rfl: 0  Social History   Tobacco Use  Smoking Status Former   Packs/day: 0.10   Years: 2.00   Total pack years: 0.20   Types: Cigarettes   Quit date: 11/27/2017   Years since quitting: 4.2  Smokeless Tobacco Never    Allergies  Allergen Reactions   Morphine And Related Shortness Of Breath   Glyburide Diarrhea and Other (See Comments)    Reaction:  Nose bleeds     Hydrocodone Other (See Comments)    Pt states that this medication makes her "dream about rabbits chasing her"   Ivp Dye [Iodinated Contrast Media] Other (See Comments)    Shortness of breath.     Oxycodone Other (See Comments)    Pt states that this medication makes her "dream about rabbits chasing" her.     Objective:  There were no vitals filed for this visit. There is no height or weight on file to calculate BMI. Constitutional Well developed. Well nourished.  Vascular Foot warm and well  perfused. Capillary refill normal to all digits.   Neurologic Normal speech. Oriented to person, place, and time. Epicritic sensation to light touch grossly present bilaterally.  Dermatologic Skin completely reepithelialized.  Recurrence of [place] fasciitis clinically appreciated.  Pain on palpation to the calcaneal tuber positive Silfverskiold trace with gastrocnemius equinus  Orthopedic: Tenderness to palpation noted about the surgical site.   Radiographs: None Assessment:   1. Plantar fasciitis, right   2. Intractable right heel pain     Plan:  Patient was evaluated and treated and all questions answered.  S/p foot surgery right -Progressing as expected post-operatively. -Continue doing physical therapy. -MRI was reviewed which shows recurrence of plantar fasciitis.  At this time I discussed surgical options she would like to hold off on surgery her pain is more manageable she will return to me when she is ready.  No follow-ups on file.

## 2022-03-09 ENCOUNTER — Other Ambulatory Visit: Payer: Self-pay

## 2022-03-12 ENCOUNTER — Other Ambulatory Visit: Payer: Self-pay

## 2022-03-16 ENCOUNTER — Other Ambulatory Visit: Payer: Self-pay

## 2022-03-16 ENCOUNTER — Ambulatory Visit: Payer: Self-pay | Admitting: *Deleted

## 2022-03-16 ENCOUNTER — Ambulatory Visit (HOSPITAL_COMMUNITY): Payer: Self-pay

## 2022-03-16 ENCOUNTER — Ambulatory Visit
Admission: RE | Admit: 2022-03-16 | Discharge: 2022-03-16 | Disposition: A | Discharge status: Home / Self Care | Payer: Medicaid Other | Source: Ambulatory Visit | Attending: Urgent Care | Admitting: Urgent Care

## 2022-03-16 ENCOUNTER — Ambulatory Visit (INDEPENDENT_AMBULATORY_CARE_PROVIDER_SITE_OTHER): Payer: Medicaid Other

## 2022-03-16 VITALS — BP 129/90 | HR 128 | Temp 98.3°F | Resp 16

## 2022-03-16 DIAGNOSIS — J4530 Mild persistent asthma, uncomplicated: Secondary | ICD-10-CM | POA: Diagnosis not present

## 2022-03-16 DIAGNOSIS — R0902 Hypoxemia: Secondary | ICD-10-CM

## 2022-03-16 DIAGNOSIS — J302 Other seasonal allergic rhinitis: Secondary | ICD-10-CM

## 2022-03-16 DIAGNOSIS — Z794 Long term (current) use of insulin: Secondary | ICD-10-CM

## 2022-03-16 DIAGNOSIS — R051 Acute cough: Secondary | ICD-10-CM | POA: Diagnosis not present

## 2022-03-16 DIAGNOSIS — Z79899 Other long term (current) drug therapy: Secondary | ICD-10-CM | POA: Insufficient documentation

## 2022-03-16 DIAGNOSIS — B349 Viral infection, unspecified: Secondary | ICD-10-CM

## 2022-03-16 DIAGNOSIS — Z1152 Encounter for screening for COVID-19: Secondary | ICD-10-CM | POA: Insufficient documentation

## 2022-03-16 DIAGNOSIS — E119 Type 2 diabetes mellitus without complications: Secondary | ICD-10-CM

## 2022-03-16 MED ORDER — PROMETHAZINE-DM 6.25-15 MG/5ML PO SYRP
2.5000 mL | ORAL_SOLUTION | Freq: Three times a day (TID) | ORAL | 0 refills | Status: DC | PRN
  Filled 2022-03-16: qty 100, 14d supply, fill #0

## 2022-03-16 MED ORDER — BENZONATATE 100 MG PO CAPS
100.0000 mg | ORAL_CAPSULE | Freq: Three times a day (TID) | ORAL | 0 refills | Status: DC | PRN
Start: 2022-03-16 — End: 2022-03-16

## 2022-03-16 MED ORDER — PREDNISONE 20 MG PO TABS
ORAL_TABLET | ORAL | 0 refills | Status: DC
  Filled 2022-03-16 (×2): qty 10, 5d supply, fill #0

## 2022-03-16 MED ORDER — LEVOCETIRIZINE DIHYDROCHLORIDE 5 MG PO TABS
5.0000 mg | ORAL_TABLET | Freq: Every evening | ORAL | 0 refills | Status: DC
  Filled 2022-03-16 (×2): qty 30, 30d supply, fill #0
  Filled 2022-08-16: qty 30, 30d supply, fill #1

## 2022-03-16 MED ORDER — BENZONATATE 100 MG PO CAPS
100.0000 mg | ORAL_CAPSULE | Freq: Three times a day (TID) | ORAL | 0 refills | Status: DC | PRN
  Filled 2022-03-16: qty 30, 10d supply, fill #0

## 2022-03-16 MED ORDER — PROMETHAZINE-DM 6.25-15 MG/5ML PO SYRP: 2.5000 mL | ORAL_SOLUTION | Freq: Three times a day (TID) | ORAL | 0 refills | Status: DC | PRN

## 2022-03-16 MED ORDER — PREDNISONE 20 MG PO TABS: ORAL_TABLET | ORAL | 0 refills | Status: DC

## 2022-03-16 MED ORDER — LEVOCETIRIZINE DIHYDROCHLORIDE 5 MG PO TABS: 5.0000 mg | ORAL_TABLET | Freq: Every evening | ORAL | 0 refills | Status: DC

## 2022-03-16 NOTE — Discharge Instructions (Addendum)
We will notify you of your test results as they arrive and may take between about 24 hours.  I encourage you to sign up for MyChart if you have not already done so as this can be the easiest way for us to communicate results to you online or through a phone app.  Generally, we only contact you if it is a positive test result.  In the meantime, if you develop worsening symptoms including fever, chest pain, shortness of breath despite our current treatment plan then please report to the emergency room as this may be a sign of worsening status from possible viral infection.  Otherwise, we will manage this as a viral syndrome. For sore throat or cough try using a honey-based tea. Use 3 teaspoons of honey with juice squeezed from half lemon. Place shaved pieces of ginger into 1/2-1 cup of water and warm over stove top. Then mix the ingredients and repeat every 4 hours as needed. Please take Tylenol 500mg-650mg every 6 hours for aches and pains, fevers. Hydrate very well with at least 2 liters of water. Eat light meals such as soups to replenish electrolytes and soft fruits, veggies. Start an antihistamine like Zyrtec for postnasal drainage, sinus congestion.  You can take this together with prednisone.  Use the cough medications as needed.   

## 2022-03-16 NOTE — Telephone Encounter (Signed)
Pt had called in c/o having itchy eyes and didn't want It to go into a cold.   Requesting to be seen today but there are no appts. Today at [place].  When I called pt she said she had just spoken with someone.   She has an appt. Set up with the urgent care for this afternoon.  No triage needed.

## 2022-03-16 NOTE — ED Triage Notes (Signed)
Pt states yesterday her eyes were itchy and her nose was itchy and she was having post nasal drip. States today she started with itchy throat and ear pain.

## 2022-03-16 NOTE — ED Provider Notes (Signed)
[facility]  Note:  This document was prepared using Conservation officer, historic buildings and may include unintentional dictation errors.  MRN: [id number] DOB: 24-Jul-1980  Subjective:   Sarah Garrison is a 41 y.o. female presenting for 1 day history of acute onset sinus congestion, itchy eyes, itchy nose, postnasal drainage and a dry cough.  Patient has a history of asthma but does not have an inhaler and needs one.  She has type 2 diabetes, gets treated with insulin.  Last A1c was 7.7%.  No overt chest pain, shortness of breath or wheezing.  She vapes a couple of times a month.  No current facility-administered medications for this encounter.  Current Outpatient Medications:    acetaminophen (TYLENOL) 500 MG tablet, Take 1 tablet (500 mg total) by mouth every 6 (six) hours as needed., Disp: 30 tablet, Rfl: 0   atorvastatin (LIPITOR) 20 MG tablet, Take 1 tablet (20 mg total) by mouth once daily., Disp: 90 tablet, Rfl: 1   cyclobenzaprine (FLEXERIL) 10 MG tablet, Take 1 tablet (10 mg total) by mouth at bedtime., Disp: 30 tablet, Rfl: 0   diclofenac (FLECTOR) 1.3 % PTCH, Place 1 patch onto the skin 2 (two) times daily., Disp: 60 patch, Rfl: 1   famotidine (PEPCID) 20 MG tablet, Take 1 tablet (20 mg total) by mouth 2 (two) times daily., Disp: 30 tablet, Rfl: 0   fluticasone (FLONASE) 50 MCG/ACT nasal spray, Place 2 sprays into both nostrils daily., Disp: 16 g, Rfl: 1   hydrocortisone 2.5 % lotion, Apply topically 2 (two) times daily., Disp: 59 mL, Rfl: 0   hydrOXYzine (ATARAX) 25 MG tablet, Take 1 tablet (25 mg total) by mouth every 8 (eight) hours as needed for itching., Disp: 20 tablet, Rfl: 0   ibuprofen (ADVIL) 800 MG tablet, Take 1 tablet (800 mg total) by mouth every 8 (eight) hours as needed., Disp: 30 tablet, Rfl: 0   insulin aspart (NOVOLOG FLEXPEN) 100 UNIT/ML FlexPen, Inject into the skin 3 (three) times daily before meals. ONLY WHEN INSULIN PUMP MALFUNCTION'S  PER ENDOCRINOLOGY DIRECTION, Disp: , Rfl:    insulin aspart (NOVOLOG) 100 UNIT/ML injection, USE VIA INSULIN PUMP. MAX TOTAL DAILY DOSE OF 200 UNITS, Disp: 90 mL, Rfl: 5   insulin glargine, 1 Unit Dial, (TOUJEO SOLOSTAR) 300 UNIT/ML Solostar Pen, Administer 50 units once daily only if insulin pump malfunction, Disp: 9 mL, Rfl: 1   Insulin Human (INSULIN PUMP) SOLN, Inject 1 each into the skin See admin instructions. Medication: Novolog 100 units/ml injection. Takes 100 units via pump per patient., Disp: , Rfl:    Insulin Syringe-Needle U-100 31G X 5/16" 1 ML MISC, inject 10 units into the skin 3 times daily. use to inject novolog based on insulin to card ratio- max daily dose 150 units, Disp: 100 each, Rfl: 2   levocetirizine (XYZAL ALLERGY 24HR) 5 MG tablet, Take 1 tablet (5 mg total) by mouth every evening., Disp: 30 tablet, Rfl: 3   linaclotide (LINZESS) 72 MCG capsule, Take 1 capsule (72 mcg total) by mouth daily before breakfast., Disp: 30 capsule, Rfl: 3   lisinopril (ZESTRIL) 20 MG tablet, Take 1 tablet (20 mg total) by mouth once daily., Disp: 90 tablet, Rfl: 2   meclizine (ANTIVERT) 12.5 MG tablet, Take 1 tablet (12.5 mg total) by mouth 2 (two) times daily as needed (Motion sickness)., Disp: 30 tablet, Rfl: 0   meloxicam (MOBIC) 7.5 MG tablet, Take 1 tablet (7.5 mg total) by mouth 2 (  two) times daily with a meal., Disp: 15 tablet, Rfl: 0   Misc. Devices MISC, CPAP therapy on autopap 5-15.  Needs Small size Fisher&Paykel Full Face Mask Simplus  mask and heated humidification., Disp: 1 each, Rfl: 0   olopatadine (PATANOL) 0.1 % ophthalmic solution, Place 1 drop into both eyes 2 (two) times daily., Disp: 5 mL, Rfl: 1   ondansetron (ZOFRAN ODT) 8 MG disintegrating tablet, Take 1 tablet (8 mg total) by mouth every 8 (eight) hours as needed for nausea or vomiting., Disp: 20 tablet, Rfl: 0   pantoprazole (PROTONIX) 40 MG tablet, TAKE 1 TABLET (40 MG TOTAL) BY MOUTH DAILY., Disp: 90 tablet, Rfl: 1    pregabalin (LYRICA) 75 MG capsule, Take 1 capsule (75 mg total) by mouth 2 (two) times daily., Disp: 90 capsule, Rfl: 1   sertraline (ZOLOFT) 100 MG tablet, Take 1 tablet (100 mg total) by mouth daily., Disp: 90 tablet, Rfl: 1   traMADol (ULTRAM) 50 MG tablet, Take 1 tablet (50 mg total) by mouth every 6 (six) hours as needed., Disp: 20 tablet, Rfl: 0   triamcinolone cream (KENALOG) 0.1 %, Apply 1 Application topically 2 (two) times daily., Disp: 30 g, Rfl: 0   Vitamin D, Ergocalciferol, (DRISDOL) 1.25 MG (50000 UNIT) CAPS capsule, Take 1 capsule (50,000 Units total) by mouth every 7 (seven) days., Disp: 12 capsule, Rfl: 0   Allergies  Allergen Reactions   Morphine And Related Shortness Of Breath   Glyburide Diarrhea and Other (See Comments)    Reaction:  Nose bleeds    Hydrocodone Other (See Comments)    Pt states that this medication makes her "dream about rabbits chasing her"   Ivp Dye [Iodinated Contrast Media] Other (See Comments)    Shortness of breath.     Oxycodone Other (See Comments)    Pt states that this medication makes her "dream about rabbits chasing" her.      Past Medical History:  Diagnosis Date   Anxiety    Asthma    Chronic constipation    Chronic shortness of breath    Depression    GERD (gastroesophageal reflux disease)    History of COVID-19 09/29/2019   09-28-2020 result in epic; hospital admission 10-05-2019 for covid pneumonia with hypoxic acute respiratory failure, no oxygen needed when discharged 10-06-2019;  and positive 06-13-2020 result in epic, pt stated no symptoms   History of seizures    (05-04-2021  pt stated was told during hospital admission 05/ 2021 with covid , she had 2 seizures,  stated no medication and no seizure since)   Hypertension    Insulin dependent type 2 diabetes mellitus [facility])    endocrinologist-- Fredia Sorrow NP;   pt uses insulin pump   Insulin pump in place    Mixed hyperlipidemia    OSA (obstructive sleep apnea)    (  05-04-2021 pt stated has not used cpap since 05/ 2021) followed by dr byrum;  study in epic 02-16-2019 moderate , titrated cpap 08-28-2019   Plantar fasciitis, right    w/ chronic pain     Past Surgical History:  Procedure Laterality Date   CESAREAN SECTION  1999   PLANTAR FASCIA RELEASE Right 05/08/2021   Procedure: ENDOSCOPIC PLANTAR FASCIOTOMY;  Surgeon: Asencion Islam, DPM;  Location: [facility];  Service: Podiatry;  Laterality: Right;  WITH BLOCK   STERIOD INJECTION Bilateral 05/08/2021   Procedure: PLATELET RICH PLASMA INJECTION HEELS;  Surgeon: Asencion Islam, DPM;  Location: [facility];  Service: Podiatry;  Laterality: Bilateral;   TUBAL LIGATION Bilateral 09/29/2006   @[facility] ;   PPTL   VENTRAL HERNIA REPAIR N/A 11/27/2017   Procedure: VENTRAL HERNIA REPAIR ERAS PATHWAY;  Surgeon: 11/29/2017, MD;  Location: [facility];  Service: General;  Laterality: N/A;   WISDOM TOOTH EXTRACTION      Family History  Problem Relation Age of Onset   Asthma Mother    Kidney failure Mother    Brain cancer Mother    Asthma Father    Other Father        surgery on stomach but don't know from what   Diabetes Brother    Diabetes Maternal Grandmother     Social History   Tobacco Use   Smoking status: Former    Packs/day: 0.10    Years: 2.00    Total pack years: 0.20    Types: Cigarettes    Quit date: 11/27/2017    Years since quitting: 4.3   Smokeless tobacco: Never  Vaping Use   Vaping Use: Some days   Devices: Elfvar  (05-04-2021 per pt last vaped approx 2 wks ago)  Substance Use Topics   Alcohol use: No   Drug use: Never    ROS   Objective:   Vitals: BP (!) 129/90 (BP Location: Left Arm)   Pulse (!) 128   Temp 98.3 F (36.8 C) (Oral)   Resp 16   SpO2 93%   Physical Exam Constitutional:      General: She is not in acute distress.    Appearance: Normal appearance. She is well-developed and normal weight. She is not  ill-appearing, toxic-appearing or diaphoretic.  HENT:     Head: Normocephalic and atraumatic.     Right Ear: Tympanic membrane, ear canal and external ear normal. No drainage or tenderness. No middle ear effusion. There is no impacted cerumen. Tympanic membrane is not erythematous.     Left Ear: Tympanic membrane, ear canal and external ear normal. No drainage or tenderness.  No middle ear effusion. There is no impacted cerumen. Tympanic membrane is not erythematous.     Nose: Congestion present. No rhinorrhea.     Mouth/Throat:     Mouth: Mucous membranes are moist. No oral lesions.     Pharynx: No pharyngeal swelling, oropharyngeal exudate, posterior oropharyngeal erythema or uvula swelling.     Tonsils: No tonsillar exudate or tonsillar abscesses.  Eyes:     General: No scleral icterus.       Right eye: No discharge.        Left eye: No discharge.     Extraocular Movements: Extraocular movements intact.     Right eye: Normal extraocular motion.     Left eye: Normal extraocular motion.     Conjunctiva/sclera: Conjunctivae normal.  Cardiovascular:     Rate and Rhythm: Normal rate and regular rhythm.     Heart sounds: Normal heart sounds. No murmur heard.    No friction rub. No gallop.  Pulmonary:     Effort: Pulmonary effort is normal. No respiratory distress.     Breath sounds: No stridor. No wheezing, rhonchi or rales.  Chest:     Chest wall: No tenderness.  Musculoskeletal:     Cervical back: Normal range of motion and neck supple.  Lymphadenopathy:     Cervical: No cervical adenopathy.  Skin:    General: Skin is warm and dry.  Neurological:     General: No focal deficit present.  Mental Status: She is alert and oriented to person, place, and time.  Psychiatric:        Mood and Affect: Mood normal.        Behavior: Behavior normal.    DG Chest 2 View  Result Date: 03/16/2022 CLINICAL DATA:  Hypoxia. EXAM: CHEST - 2 VIEW COMPARISON:  AP chest 01/25/2020 FINDINGS:  Cardiac silhouette and mediastinal contours are within normal limits. The lungs are clear. No pleural effusion or pneumothorax. Mild multilevel degenerative disc changes of the thoracic spine. IMPRESSION: No active cardiopulmonary disease. Electronically Signed   By: Yvonne Kendall M.D.   On: 03/16/2022 15:58     Assessment and Plan :   PDMP not reviewed this encounter.  1. Acute viral syndrome   2. Hypoxia   3. Acute cough   4. Type 2 diabetes mellitus treated with insulin ([place])   5. Mild persistent asthma without complication   6. Seasonal allergies     Recommended an oral prednisone course in the context of her asthma and allergies.  I refilled her albuterol inhaler and her Xyzal.  Recommended she maintain it through the fall season.  COVID testing pending.  Patient would be a good candidate for Paxlovid should she test positive.  She would have to hold the atorvastatin.  No history of CKD, deferred blood work.  Chest x-ray negative for pneumonia.  Counseled patient on potential for adverse effects with medications prescribed/recommended today, ER and return-to-clinic precautions discussed, patient verbalized understanding.    Jaynee Eagles, [place] 03/16/22 1631

## 2022-03-16 NOTE — Telephone Encounter (Signed)
Pt has itchy eyes and doesn't want it to turn into a cold./ pt asked for appt for today / please advise  Called pt, stated she made appt with UC for today. Declined any further assistance.

## 2022-03-17 LAB — SARS CORONAVIRUS 2 (TAT 6-24 HRS): SARS Coronavirus 2: NEGATIVE

## 2022-03-23 ENCOUNTER — Ambulatory Visit (HOSPITAL_COMMUNITY)
Admission: EM | Admit: 2022-03-23 | Discharge: 2022-03-23 | Disposition: A | Discharge status: Home / Self Care | Payer: Medicaid Other | Attending: Emergency Medicine | Admitting: Emergency Medicine

## 2022-03-23 ENCOUNTER — Encounter (HOSPITAL_COMMUNITY): Payer: Self-pay

## 2022-03-23 DIAGNOSIS — Z0289 Encounter for other administrative examinations: Secondary | ICD-10-CM | POA: Insufficient documentation

## 2022-03-23 DIAGNOSIS — Z1152 Encounter for screening for COVID-19: Secondary | ICD-10-CM | POA: Diagnosis present

## 2022-03-23 DIAGNOSIS — K219 Gastro-esophageal reflux disease without esophagitis: Secondary | ICD-10-CM | POA: Insufficient documentation

## 2022-03-23 NOTE — ED Provider Notes (Signed)
[facility]    CSN: [id number] Arrival date & time: 03/23/22  1328      History   Chief Complaint Chief Complaint  Patient presents with   Letter for School/Work    HPI Sarah Garrison is a 41 y.o. female.  Patient presents for work note due to incident at work.  Patient states she has a history of acid reflux, she had a moment where she had a globus sensation and felt the need to stick her finger down her throat.  Patient states she had 1 episode of emesis after sticking her finger down her throat.  She states that she ate a lot of pizza last night which caused her acid reflux flareup. Patient endorsed immediate relief after emesis.  Patient reports she presents to clinic because her manager stated she needed a work note and COVID testing due to the episode of emesis.  Patient states she has chronic acid reflux and has to follow-up with GI on this issue.    Per patient statement, GI suggested that she have a surgery to help with acid reflux but she states she does not want to perform surgery at this time due to her diabetes mellitus per patient statement.   HPI  Past Medical History:  Diagnosis Date   Anxiety    Asthma    Chronic constipation    Chronic shortness of breath    Depression    GERD (gastroesophageal reflux disease)    History of COVID-19 09/29/2019   09-28-2020 result in epic; hospital admission 10-05-2019 for covid pneumonia with hypoxic acute respiratory failure, no oxygen needed when discharged 10-06-2019;  and positive 06-13-2020 result in epic, pt stated no symptoms   History of seizures    (05-04-2021  pt stated was told during hospital admission 05/ 2021 with covid , she had 2 seizures,  stated no medication and no seizure since)   Hypertension    Insulin dependent type 2 diabetes mellitus (HCC)    endocrinologist-- Fredia Sorrow NP;   pt uses insulin pump   Insulin pump in place    Mixed hyperlipidemia    OSA (obstructive sleep apnea)    (  05-04-2021 pt stated has not used cpap since 05/ 2021) followed by dr byrum;  study in epic 02-16-2019 moderate , titrated cpap 08-28-2019   Plantar fasciitis, right    w/ chronic pain    Patient Active Problem List   Diagnosis Date Noted   Palpitations 07/31/2021   Fatigue 07/31/2021   Primary hypertension 07/31/2021   Hip pain 12/08/2019   Hyperlipidemia associated with type 2 diabetes mellitus (HCC) 11/23/2019   History of COVID-19 10/27/2019   Stuttering 10/27/2019   Depression with anxiety 10/27/2019   Obesity, Class III, BMI 40-49.9 (morbid obesity) (HCC) 06/30/2019   OSA (obstructive sleep apnea) 02/16/2019   Essential hypertension 06/22/2018   Type 2 diabetes mellitus with hyperosmolarity without coma, with long-term current use of insulin (HCC) 12/13/2016   Asthma 07/30/2016   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/07/2016   DM neuropathy, type II diabetes mellitus (HCC) 08/24/2015    Past Surgical History:  Procedure Laterality Date   CESAREAN SECTION  1999   PLANTAR FASCIA RELEASE Right 05/08/2021   Procedure: ENDOSCOPIC PLANTAR FASCIOTOMY;  Surgeon: Asencion Islam, DPM;  Location: [facility];  Service: Podiatry;  Laterality: Right;  WITH BLOCK   STERIOD INJECTION Bilateral 05/08/2021   Procedure: PLATELET RICH PLASMA INJECTION HEELS;  Surgeon: Asencion Islam, DPM;  Location: [facility];  Service: Podiatry;  Laterality: Bilateral;   TUBAL LIGATION Bilateral 09/29/2006   @[facility] ;   PPTL   VENTRAL HERNIA REPAIR N/A 11/27/2017   Procedure: VENTRAL HERNIA REPAIR ERAS PATHWAY;  Surgeon: 11/29/2017, MD;  Location: [facility];  Service: General;  Laterality: N/A;   WISDOM TOOTH EXTRACTION      OB History     Gravida  5   Para  4   Term      Preterm      AB  1   Living  4      SAB  1   IAB      Ectopic      Multiple      Live Births               Home Medications    Prior to Admission medications   Medication  Sig Start Date End Date Taking? Authorizing Provider  acetaminophen (TYLENOL) 500 MG tablet Take 1 tablet (500 mg total) by mouth every 6 (six) hours as needed. 08/03/21   10/03/21, DPM  atorvastatin (LIPITOR) 20 MG tablet Take 1 tablet (20 mg total) by mouth once daily. 11/13/21     benzonatate (TESSALON) 100 MG capsule Take 1 capsule (100 mg total) by mouth 3 (three) times daily as needed for cough. 03/16/22   03/18/22, PA-C  cyclobenzaprine (FLEXERIL) 10 MG tablet Take 1 tablet (10 mg total) by mouth at bedtime. 08/03/21   10/03/21, DPM  diclofenac (FLECTOR) 1.3 % PTCH Place 1 patch onto the skin 2 (two) times daily. 08/03/21   10/03/21, DPM  famotidine (PEPCID) 20 MG tablet Take 1 tablet (20 mg total) by mouth 2 (two) times daily. 01/13/22   03/15/22, FNP  fluticasone (FLONASE) 50 MCG/ACT nasal spray Place 2 sprays into both nostrils daily. 12/13/21   02/13/22, MD  hydrocortisone 2.5 % lotion Apply topically 2 (two) times daily. 01/16/22   01/18/22, PA-C  hydrOXYzine (ATARAX) 25 MG tablet Take 1 tablet (25 mg total) by mouth every 8 (eight) hours as needed for itching. 01/16/22   01/18/22, PA-C  ibuprofen (ADVIL) 800 MG tablet Take 1 tablet (800 mg total) by mouth every 8 (eight) hours as needed. 07/13/21   Lamptey, 09/10/21, MD  insulin aspart (NOVOLOG FLEXPEN) 100 UNIT/ML FlexPen Inject into the skin 3 (three) times daily before meals. ONLY WHEN INSULIN PUMP MALFUNCTION'S PER ENDOCRINOLOGY DIRECTION    [provider]  insulin aspart (NOVOLOG) 100 UNIT/ML injection USE VIA INSULIN PUMP. MAX TOTAL DAILY DOSE OF 200 UNITS 11/13/21     insulin glargine, 1 Unit Dial, (TOUJEO SOLOSTAR) 300 UNIT/ML Solostar Pen Administer 50 units once daily only if insulin pump malfunction 02/24/21     Insulin Human (INSULIN PUMP) SOLN Inject 1 each into the skin See admin instructions. Medication: Novolog 100 units/ml injection. Takes 100 units via pump per patient.    [provider]  Insulin Syringe-Needle U-100 31G X 5/16" 1 ML MISC inject 10 units into the skin 3 times daily. use to inject novolog based on insulin to card ratio- max daily dose 150 units 09/23/20     levocetirizine (XYZAL ALLERGY 24HR) 5 MG tablet Take 1 tablet (5 mg total) by mouth every evening. 03/16/22   03/18/22, PA-C  linaclotide [facility]) 72 MCG capsule Take 1 capsule (72 mcg total) by mouth daily before breakfast. 04/26/21   04/28/21, MD  lisinopril (ZESTRIL) 20  MG tablet Take 1 tablet (20 mg total) by mouth once daily. 11/13/21     meclizine (ANTIVERT) 12.5 MG tablet Take 1 tablet (12.5 mg total) by mouth 2 (two) times daily as needed (Motion sickness). 12/13/21   Hoy Register, MD  meloxicam (MOBIC) 7.5 MG tablet Take 1 tablet (7.5 mg total) by mouth 2 (two) times daily with a meal. 08/03/21   Asencion Islam, DPM  Misc. Devices MISC CPAP therapy on autopap 5-15.  Needs Small size Fisher&Paykel Full Face Mask Simplus  mask and heated humidification. 09/13/19   Claiborne Rigg, NP  olopatadine (PATANOL) 0.1 % ophthalmic solution Place 1 drop into both eyes 2 (two) times daily. 12/13/21   Hoy Register, MD  ondansetron (ZOFRAN ODT) 8 MG disintegrating tablet Take 1 tablet (8 mg total) by mouth every 8 (eight) hours as needed for nausea or vomiting. 02/21/21   Rhys Martini, PA-C  pantoprazole (PROTONIX) 40 MG tablet TAKE 1 TABLET (40 MG TOTAL) BY MOUTH DAILY. 12/13/21   Hoy Register, MD  predniSONE (DELTASONE) 20 MG tablet Take 2 tablets daily with breakfast. 03/16/22   Wallis Bamberg, PA-C  pregabalin (LYRICA) 75 MG capsule Take 1 capsule (75 mg total) by mouth 2 (two) times daily. 08/03/21   Asencion Islam, DPM  promethazine-dextromethorphan (PROMETHAZINE-DM) 6.25-15 MG/5ML syrup Take 2.5 mLs by mouth 3 (three) times daily as needed for cough. 03/16/22   Wallis Bamberg, PA-C  sertraline (ZOLOFT) 100 MG tablet Take 1 tablet (100 mg total) by mouth daily. 12/13/21   Hoy Register, MD   triamcinolone cream (KENALOG) 0.1 % Apply 1 Application topically 2 (two) times daily. 01/13/22   Gustavus Bryant, FNP  Vitamin D, Ergocalciferol, (DRISDOL) 1.25 MG (50000 UNIT) CAPS capsule Take 1 capsule (50,000 Units total) by mouth every 7 (seven) days. 08/01/21   Tobb, Kardie, DO  ARIPiprazole (ABILIFY) 5 MG tablet Take 1 tablet (5 mg total) by mouth daily. 10/27/19 06/13/20  Sater, Pearletha Furl, MD    Family History Family History  Problem Relation Age of Onset   Asthma Mother    Kidney failure Mother    Brain cancer Mother    Asthma Father    Other Father        surgery on stomach but don't know from what   Diabetes Brother    Diabetes Maternal Grandmother     Social History Social History   Tobacco Use   Smoking status: Former    Packs/day: 0.10    Years: 2.00    Total pack years: 0.20    Types: Cigarettes    Quit date: 11/27/2017    Years since quitting: 4.3   Smokeless tobacco: Never  Vaping Use   Vaping Use: Some days   Devices: Elfvar  (05-04-2021 per pt last vaped approx 2 wks ago)  Substance Use Topics   Alcohol use: No   Drug use: Never     Allergies   Morphine and related, Glyburide, Hydrocodone, Ivp dye [iodinated contrast media], and Oxycodone   Review of Systems Review of Systems  Constitutional:  Negative for activity change, appetite change and fever.  Respiratory: Negative.    Cardiovascular: Negative.   Gastrointestinal:  Positive for vomiting (1 episode of emesis). Negative for abdominal distention, abdominal pain, anal bleeding, blood in stool, constipation, diarrhea, nausea and rectal pain.     Physical Exam Triage Vital Signs ED Triage Vitals  Enc Vitals Group     BP 03/23/22 1448 (!) 169/109  Pulse Rate 03/23/22 1448 98     Resp 03/23/22 1448 18     Temp 03/23/22 1448 98.4 F (36.9 C)     Temp Source 03/23/22 1448 Oral     SpO2 03/23/22 1448 96 %     Weight --      Height --      Head Circumference --      Peak Flow --       Pain Score 03/23/22 1450 0     Pain Loc --      Pain Edu? --      Excl. in [place]? --    No data found.  Updated Vital Signs BP (!) 169/109 (BP Location: Left Arm)   Pulse 98   Temp 98.4 F (36.9 C) (Oral)   Resp 18   SpO2 96%       Physical Exam Vitals and nursing note reviewed.  Cardiovascular:     Rate and Rhythm: Normal rate and regular rhythm.  Pulmonary:     Effort: Pulmonary effort is normal.     Breath sounds: Normal breath sounds and air entry.  Abdominal:     General: Bowel sounds are normal.     Palpations: Abdomen is soft.     Tenderness: There is no abdominal tenderness.      UC Treatments / Results  Labs (all labs ordered are listed, but only abnormal results are displayed) Labs Reviewed  SARS CORONAVIRUS 2 (TAT 6-24 HRS)    EKG   Radiology No results found.  Procedures Procedures (including critical care time)  Medications Ordered in UC Medications - No data to display  Initial Impression / Assessment and Plan / UC Course  I have reviewed the triage vital signs and the nursing notes.  Pertinent labs & imaging results that were available during my care of the patient were reviewed by me and considered in my medical decision making (see chart for details).     Patient presented to clinic for work note due to an incident at work.  Patient states her employer wanted COVID testing, COVID testing is currently pending.  Patient was made aware of results reporting protocol.  Patient states she will continue with pantoprazole and wants to follow up with GI. She was made aware of blood pressure and reports she has history of HTN, she is currently on medications, and will check at home. Patient verbalized understanding of instructions.  Final Clinical Impressions(s) / UC Diagnoses   Final diagnoses:  Gastroesophageal reflux disease, unspecified whether esophagitis present  Encounter to obtain excuse from work  Encounter for screening for COVID-19      Discharge Instructions      As discussed, you will need to follow-up with gastroenterology on ongoing acid reflux problems. I have attached a gastroenterologist to your discharge paperwork.    COVID test is pending, take a maximum of 24 hours to get the results back.  If it is positive we will call you.  You can view all your test results on MyChart.   Work note is attached for your employer.   As discussed, your blood pressure was high today, please monitor blood pressure at home and ensure that blood pressure is below 140/90.      ED Prescriptions   None    PDMP not reviewed this encounter.   Flossie Dibble, NP 03/23/22 1529

## 2022-03-23 NOTE — ED Triage Notes (Signed)
Pt states hx of acid reflux and today at work food was stuck in her throat so she put her finger in her mouth to get it out. Her manager sent her home and states needs a work note and covid test.

## 2022-03-23 NOTE — Discharge Instructions (Addendum)
As discussed, you will need to follow-up with gastroenterology on ongoing acid reflux problems. I have attached a gastroenterologist to your discharge paperwork.    COVID test is pending, take a maximum of 24 hours to get the results back.  If it is positive we will call you.  You can view all your test results on MyChart.   Work note is attached for your employer.   As discussed, your blood pressure was high today, please monitor blood pressure at home and ensure that blood pressure is below 140/90.

## 2022-03-24 LAB — SARS CORONAVIRUS 2 (TAT 6-24 HRS): SARS Coronavirus 2: NEGATIVE

## 2022-03-27 ENCOUNTER — Other Ambulatory Visit: Payer: Self-pay

## 2022-03-27 MED ORDER — LISINOPRIL 20 MG PO TABS
20.0000 mg | ORAL_TABLET | Freq: Every day | ORAL | 2 refills | Status: DC
  Filled 2022-03-27: qty 90, 90d supply, fill #0

## 2022-03-27 MED ORDER — ATORVASTATIN CALCIUM 20 MG PO TABS
20.0000 mg | ORAL_TABLET | Freq: Every day | ORAL | 1 refills | Status: DC
  Filled 2022-03-27: qty 90, 90d supply, fill #0

## 2022-03-27 MED ORDER — INSULIN ASPART 100 UNIT/ML IJ SOLN
INTRAMUSCULAR | 5 refills | Status: DC
  Filled 2022-03-27 – 2022-07-18 (×2): qty 90, 30d supply, fill #0

## 2022-03-30 ENCOUNTER — Other Ambulatory Visit: Payer: Self-pay

## 2022-04-13 ENCOUNTER — Ambulatory Visit: Payer: Medicaid Other | Admitting: Podiatry

## 2022-04-13 ENCOUNTER — Encounter: Payer: Self-pay | Admitting: Podiatry

## 2022-04-13 DIAGNOSIS — M722 Plantar fascial fibromatosis: Secondary | ICD-10-CM

## 2022-04-13 DIAGNOSIS — Z9889 Other specified postprocedural states: Secondary | ICD-10-CM

## 2022-04-13 DIAGNOSIS — M79671 Pain in right foot: Secondary | ICD-10-CM | POA: Diagnosis not present

## 2022-04-13 MED ORDER — METHYLPREDNISOLONE 4 MG PO TBPK: ORAL_TABLET | ORAL | 0 refills | Status: DC

## 2022-04-13 MED ORDER — AMMONIUM LACTATE 12 % EX LOTN: 1.0000 | TOPICAL_LOTION | CUTANEOUS | 0 refills | Status: DC | PRN

## 2022-04-13 NOTE — Progress Notes (Signed)
Subjective:  Patient ID: Sarah Garrison, female    DOB: 1980-08-26,  MRN: [id number]  Chief Complaint  Patient presents with   Plantar Fasciitis    Pt stated that the pain is not any better. She stated she never got the referral for PT so she has not been going     DOS: 05/08/2021 Procedure: Right EPF  41 y.o. female returns for post-op check.  Patient states she is doing okay.  She still has some pain.  She states that she is a diabetic with A1c of 7.7.  She states she did not get physical therapy renewal.  She denies any other acute complaints.  She had the surgery done by Dr. Marylene Land.  Review of Systems: Negative except as noted in the HPI. Denies N/V/F/Ch.  Past Medical History:  Diagnosis Date   Anxiety    Asthma    Chronic constipation    Chronic shortness of breath    Depression    GERD (gastroesophageal reflux disease)    History of COVID-19 09/29/2019   09-28-2020 result in epic; hospital admission 10-05-2019 for covid pneumonia with hypoxic acute respiratory failure, no oxygen needed when discharged 10-06-2019;  and positive 06-13-2020 result in epic, pt stated no symptoms   History of seizures    (05-04-2021  pt stated was told during hospital admission 05/ 2021 with covid , she had 2 seizures,  stated no medication and no seizure since)   Hypertension    Insulin dependent type 2 diabetes mellitus [facility])    endocrinologist-- Fredia Sorrow NP;   pt uses insulin pump   Insulin pump in place    Mixed hyperlipidemia    OSA (obstructive sleep apnea)    ( 05-04-2021 pt stated has not used cpap since 05/ 2021) followed by dr byrum;  study in epic 02-16-2019 moderate , titrated cpap 08-28-2019   Plantar fasciitis, right    w/ chronic pain    Current Outpatient Medications:    ammonium lactate (AMLACTIN) 12 % lotion, Apply 1 Application topically as needed for dry skin., Disp: 400 g, Rfl: 0   Continuous Blood Gluc Sensor (DEXCOM G6 SENSOR) MISC, USE AND CHANGE SENSOR EVERY  10 DAYS AS DIRECTED, Disp: , Rfl:    Continuous Blood Gluc Transmit (DEXCOM G6 TRANSMITTER) MISC, USE TO CHECK BLOOD SUGAR EVERY DAY. CHANGE EVERY 3 MONTHS AS DIRECTED, Disp: , Rfl:    methylPREDNISolone (MEDROL DOSEPAK) 4 MG TBPK tablet, Take as directed, Disp: 21 each, Rfl: 0   acetaminophen (TYLENOL) 500 MG tablet, Take 1 tablet (500 mg total) by mouth every 6 (six) hours as needed., Disp: 30 tablet, Rfl: 0   atorvastatin (LIPITOR) 20 MG tablet, Take 1 tablet (20 mg total) by mouth once daily., Disp: 90 tablet, Rfl: 1   atorvastatin (LIPITOR) 20 MG tablet, Take 1 tablet (20 mg total) by mouth daily., Disp: 90 tablet, Rfl: 1   benzonatate (TESSALON) 100 MG capsule, Take 1 capsule (100 mg total) by mouth 3 (three) times daily as needed for cough., Disp: 30 capsule, Rfl: 0   Continuous Blood Gluc Sensor (DEXCOM G6 SENSOR) MISC, SMARTSIG:Topical Every 10 Days, Disp: , Rfl:    cyclobenzaprine (FLEXERIL) 10 MG tablet, Take 1 tablet (10 mg total) by mouth at bedtime., Disp: 30 tablet, Rfl: 0   diclofenac (FLECTOR) 1.3 % PTCH, Place 1 patch onto the skin 2 (two) times daily., Disp: 60 patch, Rfl: 1   famotidine (PEPCID) 20 MG tablet, Take 1 tablet (20 mg total) by  mouth 2 (two) times daily., Disp: 30 tablet, Rfl: 0   fluticasone (FLONASE) 50 MCG/ACT nasal spray, Place 2 sprays into both nostrils daily., Disp: 16 g, Rfl: 1   hydrocortisone 2.5 % lotion, Apply topically 2 (two) times daily., Disp: 59 mL, Rfl: 0   hydrOXYzine (ATARAX) 25 MG tablet, Take 1 tablet (25 mg total) by mouth every 8 (eight) hours as needed for itching., Disp: 20 tablet, Rfl: 0   ibuprofen (ADVIL) 800 MG tablet, Take 1 tablet (800 mg total) by mouth every 8 (eight) hours as needed., Disp: 30 tablet, Rfl: 0   insulin aspart (NOVOLOG FLEXPEN) 100 UNIT/ML FlexPen, Inject into the skin 3 (three) times daily before meals. ONLY WHEN INSULIN PUMP MALFUNCTION'S PER ENDOCRINOLOGY DIRECTION, Disp: , Rfl:    insulin aspart (NOVOLOG) 100  UNIT/ML injection, USE VIA INSULIN PUMP. MAX TOTAL DAILY DOSE OF 200 UNITS, Disp: 90 mL, Rfl: 5   insulin aspart (NOVOLOG) 100 UNIT/ML injection, USE VIA INSULIN PUMP. MAX TOTAL DAILY DOSE OF 200 UNITS, Disp: 90 mL, Rfl: 5   insulin glargine, 1 Unit Dial, (TOUJEO SOLOSTAR) 300 UNIT/ML Solostar Pen, Administer 50 units once daily only if insulin pump malfunction, Disp: 9 mL, Rfl: 1   Insulin Human (INSULIN PUMP) SOLN, Inject 1 each into the skin See admin instructions. Medication: Novolog 100 units/ml injection. Takes 100 units via pump per patient., Disp: , Rfl:    Insulin Syringe-Needle U-100 31G X 5/16" 1 ML MISC, inject 10 units into the skin 3 times daily. use to inject novolog based on insulin to card ratio- max daily dose 150 units, Disp: 100 each, Rfl: 2   levocetirizine (XYZAL ALLERGY 24HR) 5 MG tablet, Take 1 tablet (5 mg total) by mouth every evening., Disp: 90 tablet, Rfl: 0   linaclotide (LINZESS) 72 MCG capsule, Take 1 capsule (72 mcg total) by mouth daily before breakfast., Disp: 30 capsule, Rfl: 3   lisinopril (ZESTRIL) 20 MG tablet, Take 1 tablet (20 mg total) by mouth once daily., Disp: 90 tablet, Rfl: 2   lisinopril (ZESTRIL) 20 MG tablet, Take 1 tablet (20 mg total) by mouth daily., Disp: 90 tablet, Rfl: 2   meclizine (ANTIVERT) 12.5 MG tablet, Take 1 tablet (12.5 mg total) by mouth 2 (two) times daily as needed (Motion sickness)., Disp: 30 tablet, Rfl: 0   meloxicam (MOBIC) 7.5 MG tablet, Take 1 tablet (7.5 mg total) by mouth 2 (two) times daily with a meal., Disp: 15 tablet, Rfl: 0   Misc. Devices MISC, CPAP therapy on autopap 5-15.  Needs Small size Fisher[employer]Paykel Full Face Mask Simplus  mask and heated humidification., Disp: 1 each, Rfl: 0   olopatadine (PATANOL) 0.1 % ophthalmic solution, Place 1 drop into both eyes 2 (two) times daily., Disp: 5 mL, Rfl: 1   ondansetron (ZOFRAN ODT) 8 MG disintegrating tablet, Take 1 tablet (8 mg total) by mouth every 8 (eight) hours as needed for  nausea or vomiting., Disp: 20 tablet, Rfl: 0   pantoprazole (PROTONIX) 40 MG tablet, TAKE 1 TABLET (40 MG TOTAL) BY MOUTH DAILY., Disp: 90 tablet, Rfl: 1   predniSONE (DELTASONE) 20 MG tablet, Take 2 tablets daily with breakfast., Disp: 10 tablet, Rfl: 0   pregabalin (LYRICA) 75 MG capsule, Take 1 capsule (75 mg total) by mouth 2 (two) times daily., Disp: 90 capsule, Rfl: 1   promethazine-dextromethorphan (PROMETHAZINE-DM) 6.25-15 MG/5ML syrup, Take 2.5 mLs by mouth 3 (three) times daily as needed for cough., Disp: 100 mL, Rfl: 0  sertraline (ZOLOFT) 100 MG tablet, Take 1 tablet (100 mg total) by mouth daily., Disp: 90 tablet, Rfl: 1   triamcinolone cream (KENALOG) 0.1 %, Apply 1 Application topically 2 (two) times daily., Disp: 30 g, Rfl: 0   Vitamin D, Ergocalciferol, (DRISDOL) 1.25 MG (50000 UNIT) CAPS capsule, Take 1 capsule (50,000 Units total) by mouth every 7 (seven) days., Disp: 12 capsule, Rfl: 0  Social History   Tobacco Use  Smoking Status Former   Packs/day: 0.10   Years: 2.00   Total pack years: 0.20   Types: Cigarettes   Quit date: 11/27/2017   Years since quitting: 4.3  Smokeless Tobacco Never    Allergies  Allergen Reactions   Morphine And Related Shortness Of Breath   Glyburide Diarrhea and Other (See Comments)    Reaction:  Nose bleeds    Hydrocodone Other (See Comments)    Pt states that this medication makes her "dream about rabbits chasing her"   Ivp Dye [Iodinated Contrast Media] Other (See Comments)    Shortness of breath.     Oxycodone Other (See Comments)    Pt states that this medication makes her "dream about rabbits chasing" her.     Objective:  There were no vitals filed for this visit. There is no height or weight on file to calculate BMI. Constitutional Well developed. Well nourished.  Vascular Foot warm and well perfused. Capillary refill normal to all digits.   Neurologic Normal speech. Oriented to person, place, and time. Epicritic  sensation to light touch grossly present bilaterally.  Dermatologic Skin completely reepithelialized.  Recurrence of Planter fasciitis clinically appreciated.  Pain on palpation to the calcaneal tuber positive Silfverskiold trace with gastrocnemius equinus  Orthopedic: Tenderness to palpation noted about the surgical site.   Radiographs: None Assessment:   1. Plantar fasciitis, right     Plan:  Patient was evaluated and treated and all questions answered.  Right Planter fasciitis recurrence -Progressing as expected post-operatively. -I sent another referral for physical therapy. -MRI was reviewed which shows recurrence of plantar fasciitis.  At this time I discussed surgical options she would like to hold off on surgery her pain is more manageable she will return to me when she is ready. -Compression anklet was dispensed  No follow-ups on file.

## 2022-04-19 ENCOUNTER — Encounter: Payer: Self-pay | Admitting: Podiatry

## 2022-04-29 ENCOUNTER — Ambulatory Visit (HOSPITAL_COMMUNITY)
Admission: RE | Admit: 2022-04-29 | Discharge: 2022-04-29 | Disposition: A | Discharge status: Home / Self Care | Payer: Medicaid Other | Source: Ambulatory Visit | Attending: Internal Medicine | Admitting: Internal Medicine

## 2022-04-29 ENCOUNTER — Encounter (HOSPITAL_COMMUNITY): Payer: Self-pay

## 2022-04-29 VITALS — BP 125/83 | HR 94 | Temp 97.8°F | Resp 17

## 2022-04-29 DIAGNOSIS — Z20822 Contact with and (suspected) exposure to covid-19: Secondary | ICD-10-CM

## 2022-04-29 DIAGNOSIS — Z1152 Encounter for screening for COVID-19: Secondary | ICD-10-CM | POA: Insufficient documentation

## 2022-04-29 NOTE — ED Triage Notes (Signed)
Pt requesting a covid test in order to return to work. Denies symptoms at this time.

## 2022-04-29 NOTE — ED Provider Notes (Signed)
[facility]    CSN: [id number] Arrival date & time: 04/29/22  1016      History   Chief Complaint Chief Complaint  Patient presents with   Covid Test    HPI Sarah Garrison is a 41 y.o. female.   Patient presents urgent care for COVID-19 testing.  She is required to have a COVID-19 test performed due to her job and was offered 1 at her job but declined this and wanted to come to urgent care for a PCR test since she "did not trust the people doing them at her job".  She is not having any COVID-19 symptoms and states she "feels fine".  She is a type II diabetic and currently takes insulin via insulin pump.  History of asthma and hypertension as well.  Denies shortness of breath, chest pain, and weakness.     Past Medical History:  Diagnosis Date   Anxiety    Asthma    Chronic constipation    Chronic shortness of breath    Depression    GERD (gastroesophageal reflux disease)    History of COVID-19 09/29/2019   09-28-2020 result in epic; hospital admission 10-05-2019 for covid pneumonia with hypoxic acute respiratory failure, no oxygen needed when discharged 10-06-2019;  and positive 06-13-2020 result in epic, pt stated no symptoms   History of seizures    (05-04-2021  pt stated was told during hospital admission 05/ 2021 with covid , she had 2 seizures,  stated no medication and no seizure since)   Hypertension    Insulin dependent type 2 diabetes mellitus (HCC)    endocrinologist-- Fredia Sorrowjamie huffman NP;   pt uses insulin pump   Insulin pump in place    Mixed hyperlipidemia    OSA (obstructive sleep apnea)    ( 05-04-2021 pt stated has not used cpap since 05/ 2021) followed by dr byrum;  study in epic 02-16-2019 moderate , titrated cpap 08-28-2019   Plantar fasciitis, right    w/ chronic pain    Patient Active Problem List   Diagnosis Date Noted   Palpitations 07/31/2021   Fatigue 07/31/2021   Primary hypertension 07/31/2021   Hip pain 12/08/2019    Hyperlipidemia associated with type 2 diabetes mellitus (HCC) 11/23/2019   History of COVID-19 10/27/2019   Stuttering 10/27/2019   Depression with anxiety 10/27/2019   Obesity, Class III, BMI 40-49.9 (morbid obesity) (HCC) 06/30/2019   OSA (obstructive sleep apnea) 02/16/2019   Essential hypertension 06/22/2018   Type 2 diabetes mellitus with hyperosmolarity without coma, with long-term current use of insulin (HCC) 12/13/2016   Asthma 07/30/2016   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/07/2016   DM neuropathy, type II diabetes mellitus (HCC) 08/24/2015    Past Surgical History:  Procedure Laterality Date   CESAREAN SECTION  1999   PLANTAR FASCIA RELEASE Right 05/08/2021   Procedure: ENDOSCOPIC PLANTAR FASCIOTOMY;  Surgeon: Asencion IslamStover, Titorya, DPM;  Location: [facility];  Service: Podiatry;  Laterality: Right;  WITH BLOCK   STERIOD INJECTION Bilateral 05/08/2021   Procedure: PLATELET RICH PLASMA INJECTION HEELS;  Surgeon: Asencion IslamStover, Titorya, DPM;  Location: [facility];  Service: Podiatry;  Laterality: Bilateral;   TUBAL LIGATION Bilateral 09/29/2006   @[facility] ;   [facility]   VENTRAL HERNIA REPAIR N/A 11/27/2017   Procedure: VENTRAL HERNIA REPAIR ERAS PATHWAY;  Surgeon: Harriette Bouillonornett, Thomas, MD;  Location: [facility];  Service: General;  Laterality: N/A;   WISDOM TOOTH EXTRACTION      OB History  Gravida  5   Para  4   Term      Preterm      AB  1   Living  4      SAB  1   IAB      Ectopic      Multiple      Live Births               Home Medications    Prior to Admission medications   Medication Sig Start Date End Date Taking? Authorizing Provider  acetaminophen (TYLENOL) 500 MG tablet Take 1 tablet (500 mg total) by mouth every 6 (six) hours as needed. 08/03/21   Asencion Islam, DPM  ammonium lactate (AMLACTIN) 12 % lotion Apply 1 Application topically as needed for dry skin. 04/13/22   Candelaria Stagers, DPM  atorvastatin (LIPITOR) 20 MG  tablet Take 1 tablet (20 mg total) by mouth once daily. 11/13/21     atorvastatin (LIPITOR) 20 MG tablet Take 1 tablet (20 mg total) by mouth daily. 03/27/22     benzonatate (TESSALON) 100 MG capsule Take 1 capsule (100 mg total) by mouth 3 (three) times daily as needed for cough. 03/16/22   Wallis Bamberg, PA-C  Continuous Blood Gluc Sensor (DEXCOM G6 SENSOR) MISC USE AND CHANGE SENSOR EVERY 10 DAYS AS DIRECTED 03/14/22   [provider]  Continuous Blood Gluc Sensor (DEXCOM G6 SENSOR) MISC SMARTSIG:Topical Every 10 Days    [provider]  Continuous Blood Gluc Transmit (DEXCOM G6 TRANSMITTER) MISC USE TO CHECK BLOOD SUGAR EVERY DAY. CHANGE EVERY 3 MONTHS AS DIRECTED 03/14/22   [provider]  cyclobenzaprine (FLEXERIL) 10 MG tablet Take 1 tablet (10 mg total) by mouth at bedtime. 08/03/21   Asencion Islam, DPM  diclofenac (FLECTOR) 1.3 % PTCH Place 1 patch onto the skin 2 (two) times daily. 08/03/21   Asencion Islam, DPM  famotidine (PEPCID) 20 MG tablet Take 1 tablet (20 mg total) by mouth 2 (two) times daily. 01/13/22   Gustavus Bryant, FNP  fluticasone (FLONASE) 50 MCG/ACT nasal spray Place 2 sprays into both nostrils daily. 12/13/21   Hoy Register, MD  hydrocortisone 2.5 % lotion Apply topically 2 (two) times daily. 01/16/22   Marita Kansas, PA-C  hydrOXYzine (ATARAX) 25 MG tablet Take 1 tablet (25 mg total) by mouth every 8 (eight) hours as needed for itching. 01/16/22   Marita Kansas, PA-C  ibuprofen (ADVIL) 800 MG tablet Take 1 tablet (800 mg total) by mouth every 8 (eight) hours as needed. 07/13/21   Lamptey, Britta Mccreedy, MD  insulin aspart (NOVOLOG FLEXPEN) 100 UNIT/ML FlexPen Inject into the skin 3 (three) times daily before meals. ONLY WHEN INSULIN PUMP MALFUNCTION'S PER ENDOCRINOLOGY DIRECTION    [provider]  insulin aspart (NOVOLOG) 100 UNIT/ML injection USE VIA INSULIN PUMP. MAX TOTAL DAILY DOSE OF 200 UNITS 11/13/21     insulin aspart (NOVOLOG) 100 UNIT/ML  injection USE VIA INSULIN PUMP. MAX TOTAL DAILY DOSE OF 200 UNITS 03/27/22     insulin glargine, 1 Unit Dial, (TOUJEO SOLOSTAR) 300 UNIT/ML Solostar Pen Administer 50 units once daily only if insulin pump malfunction 02/24/21     Insulin Human (INSULIN PUMP) SOLN Inject 1 each into the skin See admin instructions. Medication: Novolog 100 units/ml injection. Takes 100 units via pump per patient.    [provider]  Insulin Syringe-Needle U-100 31G X 5/16" 1 ML MISC inject 10 units into the skin 3 times daily. use to  inject novolog based on insulin to card ratio- max daily dose 150 units 09/23/20     levocetirizine (XYZAL ALLERGY 24HR) 5 MG tablet Take 1 tablet (5 mg total) by mouth every evening. 03/16/22   Wallis Bamberg, PA-C  linaclotide [facility]) 72 MCG capsule Take 1 capsule (72 mcg total) by mouth daily before breakfast. 04/26/21   Hoy Register, MD  lisinopril (ZESTRIL) 20 MG tablet Take 1 tablet (20 mg total) by mouth once daily. 11/13/21     lisinopril (ZESTRIL) 20 MG tablet Take 1 tablet (20 mg total) by mouth daily. 03/27/22     meclizine (ANTIVERT) 12.5 MG tablet Take 1 tablet (12.5 mg total) by mouth 2 (two) times daily as needed (Motion sickness). 12/13/21   Hoy Register, MD  meloxicam (MOBIC) 7.5 MG tablet Take 1 tablet (7.5 mg total) by mouth 2 (two) times daily with a meal. 08/03/21   Asencion Islam, DPM  methylPREDNISolone (MEDROL DOSEPAK) 4 MG TBPK tablet Take as directed 04/13/22   Candelaria Stagers, DPM  Misc. Devices MISC CPAP therapy on autopap 5-15.  Needs Small size Fisher&Paykel Full Face Mask Simplus  mask and heated humidification. 09/13/19   Claiborne Rigg, NP  olopatadine (PATANOL) 0.1 % ophthalmic solution Place 1 drop into both eyes 2 (two) times daily. 12/13/21   Hoy Register, MD  ondansetron (ZOFRAN ODT) 8 MG disintegrating tablet Take 1 tablet (8 mg total) by mouth every 8 (eight) hours as needed for nausea or vomiting. 02/21/21   Rhys Martini, PA-C   pantoprazole (PROTONIX) 40 MG tablet TAKE 1 TABLET (40 MG TOTAL) BY MOUTH DAILY. 12/13/21   Hoy Register, MD  predniSONE (DELTASONE) 20 MG tablet Take 2 tablets daily with breakfast. 03/16/22   Wallis Bamberg, PA-C  pregabalin (LYRICA) 75 MG capsule Take 1 capsule (75 mg total) by mouth 2 (two) times daily. 08/03/21   Asencion Islam, DPM  promethazine-dextromethorphan (PROMETHAZINE-DM) 6.25-15 MG/5ML syrup Take 2.5 mLs by mouth 3 (three) times daily as needed for cough. 03/16/22   Wallis Bamberg, PA-C  sertraline (ZOLOFT) 100 MG tablet Take 1 tablet (100 mg total) by mouth daily. 12/13/21   Hoy Register, MD  triamcinolone cream (KENALOG) 0.1 % Apply 1 Application topically 2 (two) times daily. 01/13/22   Gustavus Bryant, FNP  Vitamin D, Ergocalciferol, (DRISDOL) 1.25 MG (50000 UNIT) CAPS capsule Take 1 capsule (50,000 Units total) by mouth every 7 (seven) days. 08/01/21   Tobb, Kardie, DO  ARIPiprazole (ABILIFY) 5 MG tablet Take 1 tablet (5 mg total) by mouth daily. 10/27/19 06/13/20  Sater, Pearletha Furl, MD    Family History Family History  Problem Relation Age of Onset   Asthma Mother    Kidney failure Mother    Brain cancer Mother    Asthma Father    Other Father        surgery on stomach but don't know from what   Diabetes Brother    Diabetes Maternal Grandmother     Social History Social History   Tobacco Use   Smoking status: Former    Packs/day: 0.10    Years: 2.00    Total pack years: 0.20    Types: Cigarettes    Quit date: 11/27/2017    Years since quitting: 4.4   Smokeless tobacco: Never  Vaping Use   Vaping Use: Some days   Devices: Elfvar  (05-04-2021 per pt last vaped approx 2 wks ago)  Substance Use Topics   Alcohol use: No   Drug  use: Never     Allergies   Morphine and related, Glyburide, Hydrocodone, Ivp dye [iodinated contrast media], and Oxycodone   Review of Systems Review of Systems Per HPI  Physical Exam Triage Vital Signs ED Triage Vitals  Enc Vitals  Group     BP 04/29/22 1034 125/83     Pulse Rate 04/29/22 1034 94     Resp 04/29/22 1034 17     Temp 04/29/22 1034 97.8 F (36.6 C)     Temp Source 04/29/22 1034 Oral     SpO2 04/29/22 1034 99 %     Weight --      Height --      Head Circumference --      Peak Flow --      Pain Score 04/29/22 1032 0     Pain Loc --      Pain Edu? --      Excl. in GC? --    No data found.  Updated Vital Signs BP 125/83 (BP Location: Right Arm)   Pulse 94   Temp 97.8 F (36.6 C) (Oral)   Resp 17   SpO2 99%   Visual Acuity Right Eye Distance:   Left Eye Distance:   Bilateral Distance:    Right Eye Near:   Left Eye Near:    Bilateral Near:     Physical Exam Vitals and nursing note reviewed.  Constitutional:      Appearance: She is not ill-appearing or toxic-appearing.  HENT:     Head: Normocephalic and atraumatic.     Right Ear: Hearing and external ear normal.     Left Ear: Hearing and external ear normal.     Nose: Nose normal.     Mouth/Throat:     Lips: Pink.     Mouth: Mucous membranes are moist.  Eyes:     General: Lids are normal. Vision grossly intact. Gaze aligned appropriately.     Extraocular Movements: Extraocular movements intact.     Conjunctiva/sclera: Conjunctivae normal.  Cardiovascular:     Heart sounds: S1 normal and S2 normal.  Pulmonary:     Effort: Pulmonary effort is normal. No respiratory distress.     Breath sounds: Normal air entry.  Musculoskeletal:     Cervical back: Neck supple.  Skin:    General: Skin is warm and dry.     Capillary Refill: Capillary refill takes less than 2 seconds.     Findings: No rash.  Neurological:     General: No focal deficit present.     Mental Status: She is alert and oriented to person, place, and time. Mental status is at baseline.     Cranial Nerves: No dysarthria or facial asymmetry.  Psychiatric:        Mood and Affect: Mood normal.        Speech: Speech normal.        Behavior: Behavior normal.         Thought Content: Thought content normal.        Judgment: Judgment normal.      UC Treatments / Results  Labs (all labs ordered are listed, but only abnormal results are displayed) Labs Reviewed  SARS CORONAVIRUS 2 (TAT 6-24 HRS)    EKG   Radiology No results found.  Procedures Procedures (including critical care time)  Medications Ordered in UC Medications - No data to display  Initial Impression / Assessment and Plan / UC Course  I have reviewed the triage vital signs  and the nursing notes.  Pertinent labs & imaging results that were available during my care of the patient were reviewed by me and considered in my medical decision making (see chart for details).   1.  Encounter for screening for COVID-19 COVID-19 PCR testing is pending.  We will call patient with positive results.  If negative, we will not contact patient.  She is agreeable with this plan.  ED and urgent care return precautions discussed.  She is a candidate for antiviral therapy should she test positive.       Final Clinical Impressions(s) / UC Diagnoses   Final diagnoses:  Encounter for screening for COVID-19     Discharge Instructions      COVID-19 testing is pending and will come back in the next 12 to 24 hours. We will call you if this is positive.  If you develop any new or worsening symptoms or do not improve in the next 2 to 3 days, please return.  If your symptoms are severe, please go to the emergency room.  Follow-up with your primary care provider for further evaluation and management of your symptoms as well as ongoing wellness visits.  I hope you feel better!   ED Prescriptions   None    PDMP not reviewed this encounter.   Carlisle Beers, FNP 04/29/22 1057

## 2022-04-29 NOTE — Discharge Instructions (Addendum)
COVID-19 testing is pending and will come back in the next 12 to 24 hours. We will call you if this is positive.  If you develop any new or worsening symptoms or do not improve in the next 2 to 3 days, please return.  If your symptoms are severe, please go to the emergency room.  Follow-up with your primary care provider for further evaluation and management of your symptoms as well as ongoing wellness visits.  I hope you feel better!

## 2022-04-30 LAB — SARS CORONAVIRUS 2 (TAT 6-24 HRS): SARS Coronavirus 2: NEGATIVE

## 2022-05-14 NOTE — Therapy (Signed)
OUTPATIENT PHYSICAL THERAPY LOWER EXTREMITY EVALUATION   Patient Name: Sarah Garrison MRN: [id number] DOB:07-Sep-1980, 41 y.o., female Today's Date: 05/16/2022   PT End of Session - 05/15/22 1609     Visit Number 1    Number of Visits 7    Date for PT Re-Evaluation 06/30/22    Authorization Type MCD- [employer] requesting auth    PT Start Time 1612    PT Stop Time 1650    PT Time Calculation (min) 38 min    Activity Tolerance Patient limited by pain    Behavior [facility] for tasks assessed/performed              Past Medical History:  Diagnosis Date   Anxiety    Asthma    Chronic constipation    Chronic shortness of breath    Depression    GERD (gastroesophageal reflux disease)    History of COVID-19 09/29/2019   09-28-2020 result in epic; hospital admission 10-05-2019 for covid pneumonia with hypoxic acute respiratory failure, no oxygen needed when discharged 10-06-2019;  and positive 06-13-2020 result in epic, pt stated no symptoms   History of seizures    (05-04-2021  pt stated was told during hospital admission 05/ 2021 with covid , she had 2 seizures,  stated no medication and no seizure since)   Hypertension    Insulin dependent type 2 diabetes mellitus (HCC)    endocrinologist-- Fredia Sorrowjamie huffman NP;   pt uses insulin pump   Insulin pump in place    Mixed hyperlipidemia    OSA (obstructive sleep apnea)    ( 05-04-2021 pt stated has not used cpap since 05/ 2021) followed by dr byrum;  study in epic 02-16-2019 moderate , titrated cpap 08-28-2019   Plantar fasciitis, right    w/ chronic pain   Past Surgical History:  Procedure Laterality Date   CESAREAN SECTION  1999   PLANTAR FASCIA RELEASE Right 05/08/2021   Procedure: ENDOSCOPIC PLANTAR FASCIOTOMY;  Surgeon: Asencion IslamStover, Titorya, DPM;  Location: [facility];  Service: Podiatry;  Laterality: Right;  WITH BLOCK   STERIOD INJECTION Bilateral 05/08/2021   Procedure: PLATELET RICH PLASMA  INJECTION HEELS;  Surgeon: Asencion IslamStover, Titorya, DPM;  Location: [facility];  Service: Podiatry;  Laterality: Bilateral;   TUBAL LIGATION Bilateral 09/29/2006   @[facility] ;   [facility]   VENTRAL HERNIA REPAIR N/A 11/27/2017   Procedure: VENTRAL HERNIA REPAIR ERAS PATHWAY;  Surgeon: Harriette Bouillonornett, Thomas, MD;  Location: [facility];  Service: General;  Laterality: N/A;   WISDOM TOOTH EXTRACTION     Patient Active Problem List   Diagnosis Date Noted   Palpitations 07/31/2021   Fatigue 07/31/2021   Primary hypertension 07/31/2021   Hip pain 12/08/2019   Hyperlipidemia associated with type 2 diabetes mellitus (HCC) 11/23/2019   History of COVID-19 10/27/2019   Stuttering 10/27/2019   Depression with anxiety 10/27/2019   Obesity, Class III, BMI 40-49.9 (morbid obesity) (HCC) 06/30/2019   OSA (obstructive sleep apnea) 02/16/2019   Essential hypertension 06/22/2018   Type 2 diabetes mellitus with hyperosmolarity without coma, with long-term current use of insulin (HCC) 12/13/2016   Asthma 07/30/2016   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/07/2016   DM neuropathy, type II diabetes mellitus (HCC) 08/24/2015    PCP: Maggie FontHuffman, Jamie J, FNP  REFERRING PROVIDER: Candelaria StagersPatel, Kevin P, DPM   REFERRING DIAG: M72.2 (ICD-10-CM) - Plantar fasciitis, right   THERAPY DIAG:  Pain in right foot  Muscle weakness (generalized)  Other abnormalities  of gait and mobility  Localized edema  Rationale for Evaluation and Treatment Rehabilitation  ONSET DATE: 05/08/21  SUBJECTIVE:   SUBJECTIVE STATEMENT: Patient reports her right foot pain has progressively worsened over the past few months. The whole foot has been swelling and has noticed that her big toe is starting to "change colors". She was given a compression sock at latest MD appointment, but could not tolerate this due to increased pain. She reports numbness and tingling along the bottom of the foot and feels like "needles are going in" when she tries to put  her heel down on the ground. She had an MRI and was suggested to undergo another surgery, but patient has declined surgery at this time. She has also started noticing pain in the bottom of the left foot that began two days ago without known cause and it now hurts to put this foot all the way down on the ground when she is walking too.   PERTINENT HISTORY: Right ENDOSCOPIC PLANTAR FASCIOTOMY 05/08/2021 History of seizures  Diabetes   PAIN:  Are you having pain? Yes: NPRS scale: 7/10 Pain location: Rt heel and plantar aspect of Lt foot Pain description: sharp, numbness, tingling Aggravating factors: walking, standing Relieving factors: medication, rest   PRECAUTIONS: None  WEIGHT BEARING RESTRICTIONS No  FALLS:  Has patient fallen in last 6 months? No  LIVING ENVIRONMENT: Lives with: lives with their family Lives in: House/apartment Stairs: Yes: Internal: 10 steps; can reach both Has following equipment at home: None  OCCUPATION: [employer]    PLOF: Independent  PATIENT GOALS "I want to walk normal and I don't want to have another surgery."    OBJECTIVE:   DIAGNOSTIC FINDINGS:  MRI Right Foot IMPRESSION: 1. Findings consistent with moderate plantar fasciitis. No evidence of fascial rupture or significant surrounding soft tissue or marrow edema. 2. Mild midfoot degenerative changes without acute osseous findings. 3. The ankle tendons and ligaments appear unremarkable.  PATIENT SURVEYS:  LEFS: 17/80  COGNITION:  Overall cognitive status: Within functional limits for tasks assessed     SENSATION: Not tested   EDEMA:  Figure 8 RLE 55 cm LLE 54 cm   MUSCLE LENGTH: Not tested  POSTURE:  In standing: maintains Rt knee in flexion and Rt foot in plantarflexion (will not bear weight through her heel) ;when instructed to place heel on the ground she supinates and will not place medial aspect of the calcaneous on the ground.   PALPATION: Extreme hypersensitivity to  light touch about Rt medial calcaneous and achilles tendon Normal capillary refill    LOWER EXTREMITY ROM:  Active ROM Right eval Left eval  Hip flexion    Hip extension    Hip abduction    Hip adduction    Hip internal rotation    Hip external rotation    Knee flexion    Knee extension    Ankle dorsiflexion Lacking 15  3  Ankle plantarflexion WNL WNL  Ankle inversion 10 15  Ankle eversion 2 13   (Blank rows = not tested) *pain with all AROM on the RLE  LOWER EXTREMITY MMT:  MMT Right eval Left eval  Hip flexion    Hip extension    Hip abduction    Hip adduction    Hip internal rotation    Hip external rotation    Knee flexion    Knee extension    Ankle dorsiflexion 4- 4+  Ankle plantarflexion Can complete SL calf raise Can complete SL calf  raise  Ankle inversion 4- 4+  Ankle eversion 4- 4+   (Blank rows = not tested) *pain with all MMT on the RLE  LOWER EXTREMITY SPECIAL TESTS:  Not tested   FUNCTIONAL TESTS:  SLS LLE 5 seconds; RLE 2 seconds (maintains foot in plantarflexion)   Sit to stand transfer- significant use of UE support, Modified independent (increased time)   GAIT: Distance walked: 20 ft Assistive device utilized: None Level of assistance: Complete Independence Comments: maintains both feet in plantarflexion during stance phase (will not put heel on the ground)     TODAY'S TREATMENT: [facility] Adult PT Treatment:                                                DATE: 05/15/22 Therapeutic Exercise: Demonstrated and issue initial HEP.    Therapeutic Activity: Education on assessment findings that will be addressed throughout duration of POC.       PATIENT EDUCATION:  Education details: see above Person educated: Patient Education method: Explanation, Demonstration, Tactile cues, Verbal cues, and Handouts Education comprehension: verbalized understanding, returned demonstration, verbal cues required, tactile cues required, and needs  further education   HOME EXERCISE PROGRAM: Access Code: [email or web address] URL: [email or web address] Date: 05/15/2022 Prepared by: Letitia Libra  Exercises - Supine Ankle Pumps  - 3 x daily - 7 x weekly - 2 sets - 10 reps - Seated Heel Toe Raises  - 3 x daily - 7 x weekly - 3 sets - 10 reps - Long Sitting Calf Stretch with Strap  - 3 x daily - 7 x weekly - 3 sets - 30 sec hold - Towel Desensitization  - 3 x daily - 7 x weekly - 5 minutes hold  ASSESSMENT:  CLINICAL IMPRESSION: Patient is a 41 y.o. female, well known to our clinic, who was seen today for physical therapy evaluation and treatment for chronic Rt foot pain. She has continued to experience significant foot pain s/p Rt plantar fasciotomy on 05/08/21 reporting worsening of her pain over the past few months and has recently started experiencing Lt plantar foot pain as well. She reports noticing an increase in swelling and skin changes about the right foot. She has extreme hypersensitivity to light touch about the medial calcaneous and achilles tendon. She is unable to bear weight through her heel secondary to pain, leading to an antagic gait and difficulty with transfers. She is noted to have limited Rt ankle AROM, strength deficits, and balance impairments. She will benefit from skilled PT to address the above stated deficits in order to optimize her function.   OBJECTIVE IMPAIRMENTS Abnormal gait, decreased activity tolerance, decreased balance, decreased knowledge of condition, decreased mobility, difficulty walking, decreased ROM, decreased strength, increased edema, increased fascial restrictions, impaired flexibility, impaired sensation, improper body mechanics, postural dysfunction, and pain.   ACTIVITY LIMITATIONS carrying, lifting, bending, standing, squatting, sleeping, stairs, transfers, and locomotion level  PARTICIPATION LIMITATIONS: meal prep, cleaning, laundry, shopping, community activity, occupation, and yard  work  PERSONAL FACTORS Fitness, Time since onset of injury/illness/exacerbation, and 3+ comorbidities: see PMH above  are also affecting patient's functional outcome.   REHAB POTENTIAL: Fair has previously completed PT for same issue  CLINICAL DECISION MAKING: Evolving/moderate complexity  EVALUATION COMPLEXITY: Moderate   GOALS: Goals reviewed with patient? Yes  SHORT TERM GOALS: Target date: 06/04/2022   Patient will be able to  achieve neutral foot flat alignment in standing and tolerate for at least 3 minutes to improve her ability to complete household tasks.  Baseline: unable Goal status: INITIAL  2.  Patient will be able to complete sit to stand transfer without use of UE to improve her ability to transfer out of bathtub.  Baseline: Mod I with significant use of UE support.  Goal status: INITIAL  3.  Patient will improve ankle dorsiflexion AROM by at least 10 degrees to improve gait mechanics.  Baseline: lacking 15 Goal status: INITIAL    LONG TERM GOALS: Target date: 06/25/2022    Patient will be able to achieve foot flat during stance phase of gait cycle.  Baseline: unable Goal status: INITIAL  2.  Patient will demonstrate at least 4+/5 strength in the Rt ankle to improve stability with walking activity.  Baseline: see above Goal status: INITIAL  3.  Patient will improve Rt ankle inversion and eversion AROM by at least 5 degrees to improve gait mechanics.  Baseline: see above Goal status: INITIAL  4. Patient will score at least 35/80 on LEFS to signify clinically meaningful improvement in functional abilities.   Baseline: see above Goal status: INITIAL  5. Patient will maintain SLS on the RLE for at least 5 seconds to improve gait stability.   Baseline: see above  Goal Status: INITIAL    PLAN: PT FREQUENCY: 1x/week  PT DURATION: 6 weeks  PLANNED INTERVENTIONS: Therapeutic exercises, Therapeutic activity, Neuromuscular re-education, Balance training,  Gait training, Patient/Family education, Self Care, Joint mobilization, Stair training, Aquatic Therapy, Dry Needling, Cryotherapy, Moist heat, Vasopneumatic device, Ultrasound, Ionotophoresis 4mg /ml Dexamethasone, Manual therapy, and Re-evaluation  PLAN FOR NEXT SESSION: desensitization, calf stretching, gentle ankle mobility/strengthening, gait training, balance   Check all possible CPT codes: - PT Re-evaluation, 97110- Therapeutic Exercise, [phone]- Neuro Re-education, [phone] - Gait Training, [phone] - Manual Therapy, 97530 - Therapeutic Activities, 97535 - Self Care, [phone] - Iontophoresis, 65465 - Ultrasound, and [id number] - Aquatic therapy     If treatment provided at initial evaluation, no treatment charged due to lack of authorization.    [id number], PT, DPT, ATC 05/16/22 8:53 AM

## 2022-05-15 ENCOUNTER — Other Ambulatory Visit: Payer: Self-pay

## 2022-05-15 ENCOUNTER — Ambulatory Visit: Payer: Medicaid Other | Attending: Podiatry

## 2022-05-15 DIAGNOSIS — R2689 Other abnormalities of gait and mobility: Secondary | ICD-10-CM | POA: Insufficient documentation

## 2022-05-15 DIAGNOSIS — M79671 Pain in right foot: Secondary | ICD-10-CM | POA: Diagnosis present

## 2022-05-15 DIAGNOSIS — R600 Localized edema: Secondary | ICD-10-CM | POA: Diagnosis present

## 2022-05-15 DIAGNOSIS — M6281 Muscle weakness (generalized): Secondary | ICD-10-CM | POA: Diagnosis present

## 2022-05-15 DIAGNOSIS — M722 Plantar fascial fibromatosis: Secondary | ICD-10-CM | POA: Insufficient documentation

## 2022-05-18 ENCOUNTER — Telehealth: Payer: Self-pay | Admitting: *Deleted

## 2022-05-18 ENCOUNTER — Ambulatory Visit: Payer: Medicaid Other

## 2022-05-18 ENCOUNTER — Ambulatory Visit: Payer: Medicaid Other | Attending: Family Medicine | Admitting: *Deleted

## 2022-05-18 DIAGNOSIS — Z3042 Encounter for surveillance of injectable contraceptive: Secondary | ICD-10-CM

## 2022-05-18 MED ORDER — MEDROXYPROGESTERONE ACETATE 150 MG/ML IM SUSP
150.0000 mg | Freq: Once | INTRAMUSCULAR | Status: AC
  Administered 2022-05-18: 150 mg via INTRAMUSCULAR

## 2022-05-18 NOTE — Progress Notes (Signed)
Date last pap: 02/2019. Last Depo-Provera: February 27, 2022. Side Effects if any: n/a. Serum HCG indicated? n/a. Depo-Provera 150 mg IM given by: T.Ramia Sidney,RN. Administered in left upper outer quadrant. Patient tolerated procedure well.  Next appointment due March 2 through March 16.

## 2022-05-18 NOTE — Telephone Encounter (Signed)
Requesting medications to help with weight loss. Advised patient she would need an OV.   See that patient is seen by an endocrinologist.  Advised patient to schedule apt with specialist treating A1C and DM.   Please advise otherwise

## 2022-05-21 NOTE — Telephone Encounter (Signed)
I agree that she needs an office visit to discuss weight loss.  Thank you

## 2022-05-22 ENCOUNTER — Ambulatory Visit: Payer: Medicaid Other

## 2022-05-22 DIAGNOSIS — M79671 Pain in right foot: Secondary | ICD-10-CM

## 2022-05-22 DIAGNOSIS — R600 Localized edema: Secondary | ICD-10-CM

## 2022-05-22 DIAGNOSIS — M6281 Muscle weakness (generalized): Secondary | ICD-10-CM

## 2022-05-22 DIAGNOSIS — R2689 Other abnormalities of gait and mobility: Secondary | ICD-10-CM

## 2022-05-22 NOTE — Therapy (Signed)
OUTPATIENT PHYSICAL THERAPY TREATMENT NOTE   Patient Name: Sarah Garrison MRN: [id number] DOB:[birth year], 41 y.o., female Today's Date: 05/22/2022  PCP: Hoy Register, MD  REFERRING PROVIDER: Candelaria Stagers, DPM  END OF SESSION:   PT End of Session - 05/22/22 1615     Visit Number 2    Number of Visits 7    Date for PT Re-Evaluation 06/30/22    Authorization Type MCD- healthy blue auth pending    PT Start Time 1615    PT Stop Time 1659    PT Time Calculation (min) 44 min    Activity Tolerance Patient limited by pain    Behavior During Therapy [facility] for tasks assessed/performed             Past Medical History:  Diagnosis Date   Anxiety    Asthma    Chronic constipation    Chronic shortness of breath    Depression    GERD (gastroesophageal reflux disease)    History of COVID-19 09/29/2019   09-28-2020 result in epic; hospital admission 10-05-2019 for covid pneumonia with hypoxic acute respiratory failure, no oxygen needed when discharged 10-06-2019;  and positive 06-13-2020 result in epic, pt stated no symptoms   History of seizures    (05-04-2021  pt stated was told during hospital admission 05/ 2021 with covid , she had 2 seizures,  stated no medication and no seizure since)   Hypertension    Insulin dependent type 2 diabetes mellitus (HCC)    endocrinologist-- Fredia Sorrow NP;   pt uses insulin pump   Insulin pump in place    Mixed hyperlipidemia    OSA (obstructive sleep apnea)    ( 05-04-2021 pt stated has not used cpap since 05/ 2021) followed by dr byrum;  study in epic 02-16-2019 moderate , titrated cpap 08-28-2019   Plantar fasciitis, right    w/ chronic pain   Past Surgical History:  Procedure Laterality Date   CESAREAN SECTION  1999   PLANTAR FASCIA RELEASE Right 05/08/2021   Procedure: ENDOSCOPIC PLANTAR FASCIOTOMY;  Surgeon: Asencion Islam, DPM;  Location: [facility];  Service: Podiatry;  Laterality: Right;  WITH BLOCK   STERIOD  INJECTION Bilateral 05/08/2021   Procedure: PLATELET RICH PLASMA INJECTION HEELS;  Surgeon: Asencion Islam, DPM;  Location: [facility];  Service: Podiatry;  Laterality: Bilateral;   TUBAL LIGATION Bilateral 09/29/2006   @[facility] ;   [facility]   VENTRAL HERNIA REPAIR N/A 11/27/2017   Procedure: VENTRAL HERNIA REPAIR ERAS PATHWAY;  Surgeon: 11/29/2017, MD;  Location: [facility];  Service: General;  Laterality: N/A;   WISDOM TOOTH EXTRACTION     Patient Active Problem List   Diagnosis Date Noted   Palpitations 07/31/2021   Fatigue 07/31/2021   Primary hypertension 07/31/2021   Hip pain 12/08/2019   Hyperlipidemia associated with type 2 diabetes mellitus (HCC) 11/23/2019   History of COVID-19 10/27/2019   Stuttering 10/27/2019   Depression with anxiety 10/27/2019   Obesity, Class III, BMI 40-49.9 (morbid obesity) (HCC) 06/30/2019   OSA (obstructive sleep apnea) 02/16/2019   Essential hypertension 06/22/2018   Type 2 diabetes mellitus with hyperosmolarity without coma, with long-term current use of insulin (HCC) 12/13/2016   Asthma 07/30/2016   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/07/2016   DM neuropathy, type II diabetes mellitus (HCC) 08/24/2015    REFERRING DIAG: M72.2 (ICD-10-CM) - Plantar fasciitis, right   THERAPY DIAG:  Pain in right foot  Muscle weakness (generalized)  Other abnormalities  of gait and mobility  Localized edema  Rationale for Evaluation and Treatment Rehabilitation  PERTINENT HISTORY: Right ENDOSCOPIC PLANTAR FASCIOTOMY 05/08/2021 History of seizures  Diabetes   PRECAUTIONS: none   SUBJECTIVE:                                                                                                                                                                                      SUBJECTIVE STATEMENT:  Patient reports her foot is hurting and swollen from walking at work today.    PAIN:  Are you having pain? Yes: NPRS scale: 4/10 Pain location:  Rt heel Pain description: sharp Aggravating factors: weight-bearing Relieving factors: rest   OBJECTIVE: (objective measures completed at initial evaluation unless otherwise dated)  DIAGNOSTIC FINDINGS:  MRI Right Foot IMPRESSION: 1. Findings consistent with moderate plantar fasciitis. No evidence of fascial rupture or significant surrounding soft tissue or marrow edema. 2. Mild midfoot degenerative changes without acute osseous findings. 3. The ankle tendons and ligaments appear unremarkable.   PATIENT SURVEYS:  LEFS: 17/80   COGNITION:           Overall cognitive status: Within functional limits for tasks assessed                          SENSATION: Not tested    EDEMA:  Figure 8 RLE 55 cm LLE 54 cm    MUSCLE LENGTH: Not tested   POSTURE:  In standing: maintains Rt knee in flexion and Rt foot in plantarflexion (will not bear weight through her heel) ;when instructed to place heel on the ground she supinates and will not place medial aspect of the calcaneous on the ground.    PALPATION: Extreme hypersensitivity to light touch about Rt medial calcaneous and achilles tendon Normal capillary refill      LOWER EXTREMITY ROM:   Active ROM Right eval Left eval 05/22/22 Right  Hip flexion       Hip extension       Hip abduction       Hip adduction       Hip internal rotation       Hip external rotation       Knee flexion       Knee extension       Ankle dorsiflexion Lacking 15  3 Lacking 9   Ankle plantarflexion WNL WNL   Ankle inversion 10 15   Ankle eversion 2 13    (Blank rows = not tested) *pain with all AROM on the RLE   LOWER EXTREMITY MMT:   MMT Right eval Left eval  Hip flexion  Hip extension      Hip abduction      Hip adduction      Hip internal rotation      Hip external rotation      Knee flexion      Knee extension      Ankle dorsiflexion 4- 4+  Ankle plantarflexion Can complete SL calf raise Can complete SL calf raise  Ankle  inversion 4- 4+  Ankle eversion 4- 4+   (Blank rows = not tested) *pain with all MMT on the RLE   LOWER EXTREMITY SPECIAL TESTS:  Not tested    FUNCTIONAL TESTS:  SLS LLE 5 seconds; RLE 2 seconds (maintains foot in plantarflexion)    Sit to stand transfer- significant use of UE support, Modified independent (increased time)    GAIT: Distance walked: 20 ft Assistive device utilized: None Level of assistance: Complete Independence Comments: maintains both feet in plantarflexion during stance phase (will not put heel on the ground)        TODAY'S TREATMENT: [facility] Adult PT Treatment:                                                DATE: 05/22/22 Therapeutic Exercise: NuStep level 4 x 5 minutes Toe raises 2 x 10 Weight bearing through entire Rt foot with overpressure 3 x 30 sec in sitting and standing  Calf stretch with strap 2 x 30 sec  Supine HS stretch with strap 2 x 15 sec  Resisted ankle DF red band 2 x 10 Updated HEP Manual Therapy: Desensitization Rt foot Passive gastroc stretching     [facility] Adult PT Treatment:                                                DATE: 05/15/22 Therapeutic Exercise: Demonstrated and issue initial HEP.      Therapeutic Activity: Education on assessment findings that will be addressed throughout duration of POC.            PATIENT EDUCATION:  Education details: see above Person educated: Patient Education method: Explanation, Demonstration, Tactile cues, Verbal cues, and Handouts Education comprehension: verbalized understanding, returned demonstration, verbal cues required, tactile cues required, and needs further education     HOME EXERCISE PROGRAM: Access Code: [email or web address] URL: [email or web address] Date: 05/15/2022 Prepared by: Gwendolyn Grant   Exercises - Supine Ankle Pumps  - 3 x daily - 7 x weekly - 2 sets - 10 reps - Seated Heel Toe Raises  - 3 x daily - 7 x weekly - 3 sets - 10 reps - Long Sitting Calf  Stretch with Strap  - 3 x daily - 7 x weekly - 3 sets - 30 sec hold - Towel Desensitization  - 3 x daily - 7 x weekly - 5 minutes hold   ASSESSMENT:   CLINICAL IMPRESSION: Patient continues to have hypersensitivity to touch about the Rt calcaneous. Manual therapy focused on desensitization utilizing a towel with fair tolerance as she continues to report sensation of needles stabbing her foot when the towel is rubbed along the calcaneous. Worked on allowing for neutral foot and foot flat in sitting and standing with patient able to tolerate for short duration requiring assistance from PT  to achieve and maintain foot flat/neutral alignment. Her DF AROM has improved compared to initial evaluation, but remains significantly limited. HEP updated to include further ankle strengthening. No changes in pain reported at conclusion of session.      OBJECTIVE IMPAIRMENTS Abnormal gait, decreased activity tolerance, decreased balance, decreased knowledge of condition, decreased mobility, difficulty walking, decreased ROM, decreased strength, increased edema, increased fascial restrictions, impaired flexibility, impaired sensation, improper body mechanics, postural dysfunction, and pain.    ACTIVITY LIMITATIONS carrying, lifting, bending, standing, squatting, sleeping, stairs, transfers, and locomotion level   PARTICIPATION LIMITATIONS: meal prep, cleaning, laundry, shopping, community activity, occupation, and yard work   PERSONAL FACTORS Fitness, Time since onset of injury/illness/exacerbation, and 3+ comorbidities: see PMH above  are also affecting patient's functional outcome.    REHAB POTENTIAL: Fair has previously completed PT for same issue   CLINICAL DECISION MAKING: Evolving/moderate complexity   EVALUATION COMPLEXITY: Moderate     GOALS: Goals reviewed with patient? Yes   SHORT TERM GOALS: Target date: 06/04/2022     Patient will be able to achieve neutral foot flat alignment in standing and  tolerate for at least 3 minutes to improve her ability to complete household tasks.  Baseline: unable Goal status: INITIAL   2.  Patient will be able to complete sit to stand transfer without use of UE to improve her ability to transfer out of bathtub.  Baseline: Mod I with significant use of UE support.  Goal status: INITIAL   3.  Patient will improve ankle dorsiflexion AROM by at least 10 degrees to improve gait mechanics.  Baseline: lacking 15 Goal status: ongoing        LONG TERM GOALS: Target date: 06/25/2022       Patient will be able to achieve foot flat during stance phase of gait cycle.  Baseline: unable Goal status: INITIAL   2.  Patient will demonstrate at least 4+/5 strength in the Rt ankle to improve stability with walking activity.  Baseline: see above Goal status: INITIAL   3.  Patient will improve Rt ankle inversion and eversion AROM by at least 5 degrees to improve gait mechanics.  Baseline: see above Goal status: INITIAL   4. Patient will score at least 35/80 on LEFS to signify clinically meaningful improvement in functional abilities.    Baseline: see above Goal status: INITIAL   5. Patient will maintain SLS on the RLE for at least 5 seconds to improve gait stability.             Baseline: see above            Goal Status: INITIAL      PLAN: PT FREQUENCY: 1x/week   PT DURATION: 6 weeks   PLANNED INTERVENTIONS: Therapeutic exercises, Therapeutic activity, Neuromuscular re-education, Balance training, Gait training, Patient/Family education, Self Care, Joint mobilization, Stair training, Aquatic Therapy, Dry Needling, Cryotherapy, Moist heat, Vasopneumatic device, Ultrasound, Ionotophoresis 4mg /ml Dexamethasone, Manual therapy, and Re-evaluation   PLAN FOR NEXT SESSION: desensitization, calf stretching, gentle ankle mobility/strengthening, gait training, balance   Gwendolyn Grant, PT, DPT, ATC 05/22/22 5:07 PM

## 2022-05-23 NOTE — Telephone Encounter (Signed)
Scheduled an apt for patient to follow with PCP for weight management.

## 2022-05-29 ENCOUNTER — Ambulatory Visit: Payer: Medicaid Other | Attending: Family Medicine

## 2022-05-29 ENCOUNTER — Other Ambulatory Visit: Payer: Self-pay

## 2022-05-29 ENCOUNTER — Encounter: Payer: Self-pay | Admitting: Family Medicine

## 2022-05-29 DIAGNOSIS — Z20822 Contact with and (suspected) exposure to covid-19: Secondary | ICD-10-CM

## 2022-05-31 ENCOUNTER — Ambulatory Visit: Payer: Medicaid Other

## 2022-05-31 ENCOUNTER — Ambulatory Visit: Payer: Medicaid Other | Attending: Family Medicine | Admitting: Family Medicine

## 2022-05-31 ENCOUNTER — Encounter: Payer: Self-pay | Admitting: Family Medicine

## 2022-05-31 ENCOUNTER — Other Ambulatory Visit: Payer: Self-pay

## 2022-05-31 VITALS — BP 126/90 | HR 110 | Temp 98.3°F | Ht 64.0 in | Wt 255.2 lb

## 2022-05-31 DIAGNOSIS — K219 Gastro-esophageal reflux disease without esophagitis: Secondary | ICD-10-CM | POA: Diagnosis not present

## 2022-05-31 DIAGNOSIS — E1169 Type 2 diabetes mellitus with other specified complication: Secondary | ICD-10-CM

## 2022-05-31 DIAGNOSIS — I10 Essential (primary) hypertension: Secondary | ICD-10-CM | POA: Diagnosis not present

## 2022-05-31 DIAGNOSIS — Z794 Long term (current) use of insulin: Secondary | ICD-10-CM

## 2022-05-31 DIAGNOSIS — E785 Hyperlipidemia, unspecified: Secondary | ICD-10-CM

## 2022-05-31 DIAGNOSIS — E1100 Type 2 diabetes mellitus with hyperosmolarity without nonketotic hyperglycemic-hyperosmolar coma (NKHHC): Secondary | ICD-10-CM

## 2022-05-31 DIAGNOSIS — E66813 Obesity, class 3: Secondary | ICD-10-CM

## 2022-05-31 MED ORDER — ATORVASTATIN CALCIUM 40 MG PO TABS
40.0000 mg | ORAL_TABLET | Freq: Every day | ORAL | 1 refills | Status: DC
  Filled 2022-05-31: qty 90, 90d supply, fill #0

## 2022-05-31 MED ORDER — FAMOTIDINE 20 MG PO TABS
20.0000 mg | ORAL_TABLET | Freq: Every day | ORAL | 6 refills | Status: DC
  Filled 2022-05-31: qty 30, 30d supply, fill #0

## 2022-05-31 NOTE — Patient Instructions (Signed)
Gastroesophageal Reflux Disease, Adult  Gastroesophageal reflux (GER) happens when acid from the stomach flows up into the tube that connects the mouth and the stomach (esophagus). Normally, food travels down the esophagus and stays in the stomach to be digested. With GER, food and stomach acid sometimes move back up into the esophagus. You may have a disease called gastroesophageal reflux disease (GERD) if the reflux: Happens often. Causes frequent or very bad symptoms. Causes problems such as damage to the esophagus. When this happens, the esophagus becomes sore and swollen. Over time, GERD can make small holes (ulcers) in the lining of the esophagus. What are the causes? This condition is caused by a problem with the muscle between the esophagus and the stomach. When this muscle is weak or not normal, it does not close properly to keep food and acid from coming back up from the stomach. The muscle can be weak because of: Tobacco use. Pregnancy. Having a certain type of hernia (hiatal hernia). Alcohol use. Certain foods and drinks, such as coffee, chocolate, onions, and peppermint. What increases the risk? Being overweight. Having a disease that affects your connective tissue. Taking NSAIDs, such a ibuprofen. What are the signs or symptoms? Heartburn. Difficult or painful swallowing. The feeling of having a lump in the throat. A bitter taste in the mouth. Bad breath. Having a lot of saliva. Having an upset or bloated stomach. Burping. Chest pain. Different conditions can cause chest pain. Make sure you see your doctor if you have chest pain. Shortness of breath or wheezing. A long-term cough or a cough at night. Wearing away of the surface of teeth (tooth enamel). Weight loss. How is this treated? Making changes to your diet. Taking medicine. Having surgery. Treatment will depend on how bad your symptoms are. Follow these instructions at home: Eating and drinking  Follow a  diet as told by your doctor. You may need to avoid foods and drinks such as: Coffee and tea, with or without caffeine. Drinks that contain alcohol. Energy drinks and sports drinks. Bubbly (carbonated) drinks or sodas. Chocolate and cocoa. Peppermint and mint flavorings. Garlic and onions. Horseradish. Spicy and acidic foods. These include peppers, chili powder, curry powder, vinegar, hot sauces, and BBQ sauce. Citrus fruit juices and citrus fruits, such as oranges, lemons, and limes. Tomato-based foods. These include red sauce, chili, salsa, and pizza with red sauce. Fried and fatty foods. These include donuts, french fries, potato chips, and high-fat dressings. High-fat meats. These include hot dogs, rib eye steak, sausage, ham, and bacon. High-fat dairy items, such as whole milk, butter, and cream cheese. Eat small meals often. Avoid eating large meals. Avoid drinking large amounts of liquid with your meals. Avoid eating meals during the 2-3 hours before bedtime. Avoid lying down right after you eat. Do not exercise right after you eat. Lifestyle  Do not smoke or use any products that contain nicotine or tobacco. If you need help quitting, ask your doctor. Try to lower your stress. If you need help doing this, ask your doctor. If you are overweight, lose an amount of weight that is healthy for you. Ask your doctor about a safe weight loss goal. General instructions Pay attention to any changes in your symptoms. Take over-the-counter and prescription medicines only as told by your doctor. Do not take aspirin, ibuprofen, or other NSAIDs unless your doctor says it is okay. Wear loose clothes. Do not wear anything tight around your waist. Raise (elevate) the head of your bed about   6 inches (15 cm). You may need to use a wedge to do this. Avoid bending over if this makes your symptoms worse. Keep all follow-up visits. Contact a doctor if: You have new symptoms. You lose weight and you  do not know why. You have trouble swallowing or it hurts to swallow. You have wheezing or a cough that keeps happening. You have a hoarse voice. Your symptoms do not get better with treatment. Get help right away if: You have sudden pain in your arms, neck, jaw, teeth, or back. You suddenly feel sweaty, dizzy, or light-headed. You have chest pain or shortness of breath. You vomit and the vomit is green, yellow, or black, or it looks like blood or coffee grounds. You faint. Your poop (stool) is red, bloody, or black. You cannot swallow, drink, or eat. These symptoms may represent a serious problem that is an emergency. Do not wait to see if the symptoms will go away. Get medical help right away. Call your local emergency services (911 in the [place] Do not drive yourself to the hospital. Summary If a person has gastroesophageal reflux disease (GERD), food and stomach acid move back up into the esophagus and cause symptoms or problems such as damage to the esophagus. Treatment will depend on how bad your symptoms are. Follow a diet as told by your doctor. Take all medicines only as told by your doctor. This information is not intended to replace advice given to you by your health care provider. Make sure you discuss any questions you have with your health care provider. Document Revised: 11/30/2019 Document Reviewed: 11/30/2019 [employer] Patient Education  2023 [employer]  

## 2022-05-31 NOTE — Progress Notes (Signed)
Subjective:  Patient ID: Sarah Garrison, female    DOB: [birth year]  Age: 41 y.o. MRN: [id number]  CC: Weight Loss   HPI Sarah Garrison is a 41 y.o. year old female with a history of  type 2 diabetes mellitus (A1c 8.6 from 03/2022 -followed by endocrinology, [facility]), obesity, history of Covid in 09/2019, anxiety, GERD seen for an office visit.   Interval History:  She has been exercising regularly but has not been able to loose weight. She eats salads regularly as well as fruits  She Complains of reflux and sour taste She states when she sometimes feels like she has nausea and is about to vomit.  Omeprazole did not provide relief and she is currently on pantoprazole with no much relief either. She endorses adherence with her antihypertensive.  Diabetes is managed by endocrine and A1c from 03/2022 was 8.6. She has an upcoming appointment with ophthalmology. Past Medical History:  Diagnosis Date   Anxiety    Asthma    Chronic constipation    Chronic shortness of breath    Depression    GERD (gastroesophageal reflux disease)    History of COVID-19 09/29/2019   09-28-2020 result in epic; hospital admission 10-05-2019 for covid pneumonia with hypoxic acute respiratory failure, no oxygen needed when discharged 10-06-2019;  and positive 06-13-2020 result in epic, pt stated no symptoms   History of seizures    (05-04-2021  pt stated was told during hospital admission 05/ 2021 with covid , she had 2 seizures,  stated no medication and no seizure since)   Hypertension    Insulin dependent type 2 diabetes mellitus [facility])    endocrinologist-- Jacolyn Reedy NP;   pt uses insulin pump   Insulin pump in place    Mixed hyperlipidemia    OSA (obstructive sleep apnea)    ( 05-04-2021 pt stated has not used cpap since 05/ 2021) followed by dr byrum;  study in epic 02-16-2019 moderate , titrated cpap 08-28-2019   Plantar fasciitis, right    w/ chronic pain    Past Surgical History:   Procedure Laterality Date   [place] Right 05/08/2021   Procedure: ENDOSCOPIC PLANTAR FASCIOTOMY;  Surgeon: Landis Martins, DPM;  Location: [place];  Service: Podiatry;  Laterality: Right;  WITH BLOCK   STERIOD INJECTION Bilateral 05/08/2021   Procedure: PLATELET RICH PLASMA INJECTION HEELS;  Surgeon: Landis Martins, DPM;  Location: [place];  Service: Podiatry;  Laterality: Bilateral;   TUBAL LIGATION Bilateral 09/29/2006   _0 ;   PPTL   VENTRAL HERNIA REPAIR N/A 11/27/2017   Procedure: VENTRAL HERNIA REPAIR ERAS PATHWAY;  Surgeon: Erroll Luna, MD;  Location: [place];  Service: General;  Laterality: N/A;   WISDOM TOOTH EXTRACTION      Family History  Problem Relation Age of Onset   Asthma Mother    Kidney failure Mother    Brain cancer Mother    Asthma Father    Other Father        surgery on stomach but don't know from what   Diabetes Brother    Diabetes Maternal Grandmother     Social History   Socioeconomic History   Marital status: Single    Spouse name: Not on file   Number of children: 4   Years of education: Not on file   Highest education level: Not on file  Occupational History   Not on file  Tobacco Use   Smoking status: Former    Packs/day: 0.10    Years: 2.00    Total pack years: 0.20    Types: Cigarettes    Quit date: 11/27/2017    Years since quitting: 4.5   Smokeless tobacco: Never  Vaping Use   Vaping Use: Some days   Devices: Elfvar  (05-04-2021 per pt last vaped approx 2 wks ago)  Substance and Sexual Activity   Alcohol use: No   Drug use: Never   Sexual activity: Yes    Birth control/protection: Surgical, Injection  Other Topics Concern   Not on file  Social History Narrative   Right handed   No caffeine use   Diet coke sometimes    Social Determinants of Health   Financial Resource Strain: Not on file  Food Insecurity: Not on file   Transportation Needs: Not on file  Physical Activity: Not on file  Stress: Not on file  Social Connections: Not on file    Allergies  Allergen Reactions   Morphine And Related Shortness Of Breath   Glyburide Diarrhea and Other (See Comments)    Reaction:  Nose bleeds    Hydrocodone Other (See Comments)    Pt states that this medication makes her "dream about rabbits chasing her"   Ivp Dye [Iodinated Contrast Media] Other (See Comments)    Shortness of breath.     Oxycodone Other (See Comments)    Pt states that this medication makes her "dream about rabbits chasing" her.      Outpatient Medications Prior to Visit  Medication Sig Dispense Refill   benzonatate (TESSALON) 100 MG capsule Take 1 capsule (100 mg total) by mouth 3 (three) times daily as needed for cough. 30 capsule 0   Continuous Blood Gluc Sensor (DEXCOM G6 SENSOR) MISC USE AND CHANGE SENSOR EVERY 10 DAYS AS DIRECTED     Continuous Blood Gluc Sensor (DEXCOM G6 SENSOR) MISC SMARTSIG:Topical Every 10 Days     Continuous Blood Gluc Transmit (DEXCOM G6 TRANSMITTER) MISC USE TO CHECK BLOOD SUGAR EVERY DAY. CHANGE EVERY 3 MONTHS AS DIRECTED     cyclobenzaprine (FLEXERIL) 10 MG tablet Take 1 tablet (10 mg total) by mouth at bedtime. 30 tablet 0   diclofenac (FLECTOR) 1.3 % PTCH Place 1 patch onto the skin 2 (two) times daily. 60 patch 1   fluticasone (FLONASE) 50 MCG/ACT nasal spray Place 2 sprays into both nostrils daily. 16 g 1   hydrocortisone 2.5 % lotion Apply topically 2 (two) times daily. 59 mL 0   hydrOXYzine (ATARAX) 25 MG tablet Take 1 tablet (25 mg total) by mouth every 8 (eight) hours as needed for itching. 20 tablet 0   ibuprofen (ADVIL) 800 MG tablet Take 1 tablet (800 mg total) by mouth every 8 (eight) hours as needed. 30 tablet 0   insulin aspart (NOVOLOG FLEXPEN) 100 UNIT/ML FlexPen Inject into the skin 3 (three) times daily before meals. ONLY WHEN INSULIN PUMP MALFUNCTION'S PER ENDOCRINOLOGY DIRECTION      insulin aspart (NOVOLOG) 100 UNIT/ML injection USE VIA INSULIN PUMP. MAX TOTAL DAILY DOSE OF 200 UNITS 90 mL 5   insulin aspart (NOVOLOG) 100 UNIT/ML injection USE VIA INSULIN PUMP. MAX TOTAL DAILY DOSE OF 200 UNITS 90 mL 5   insulin glargine, 1 Unit Dial, (TOUJEO SOLOSTAR) 300 UNIT/ML Solostar Pen Administer 50 units once daily only if insulin pump malfunction 9 mL 1   Insulin Human (INSULIN PUMP) SOLN Inject 1 each into the skin See  admin instructions. Medication: Novolog 100 units/ml injection. Takes 100 units via pump per patient.     Insulin Syringe-Needle U-100 31G X 5/16" 1 ML MISC inject 10 units into the skin 3 times daily. use to inject novolog based on insulin to card ratio- max daily dose 150 units 100 each 2   levocetirizine (XYZAL ALLERGY 24HR) 5 MG tablet Take 1 tablet (5 mg total) by mouth every evening. 90 tablet 0   linaclotide (LINZESS) 72 MCG capsule Take 1 capsule (72 mcg total) by mouth daily before breakfast. 30 capsule 3   lisinopril (ZESTRIL) 20 MG tablet Take 1 tablet (20 mg total) by mouth once daily. 90 tablet 2   lisinopril (ZESTRIL) 20 MG tablet Take 1 tablet (20 mg total) by mouth daily. 90 tablet 2   meclizine (ANTIVERT) 12.5 MG tablet Take 1 tablet (12.5 mg total) by mouth 2 (two) times daily as needed (Motion sickness). 30 tablet 0   meloxicam (MOBIC) 7.5 MG tablet Take 1 tablet (7.5 mg total) by mouth 2 (two) times daily with a meal. 15 tablet 0   methylPREDNISolone (MEDROL DOSEPAK) 4 MG TBPK tablet Take as directed 21 each 0   Misc. Devices MISC CPAP therapy on autopap 5-15.  Needs Small size Fisher[employer]Paykel Full Face Mask Simplus  mask and heated humidification. 1 each 0   olopatadine (PATANOL) 0.1 % ophthalmic solution Place 1 drop into both eyes 2 (two) times daily. 5 mL 1   ondansetron (ZOFRAN ODT) 8 MG disintegrating tablet Take 1 tablet (8 mg total) by mouth every 8 (eight) hours as needed for nausea or vomiting. 20 tablet 0   pantoprazole (PROTONIX) 40 MG  tablet TAKE 1 TABLET (40 MG TOTAL) BY MOUTH DAILY. 90 tablet 1   predniSONE (DELTASONE) 20 MG tablet Take 2 tablets daily with breakfast. 10 tablet 0   pregabalin (LYRICA) 75 MG capsule Take 1 capsule (75 mg total) by mouth 2 (two) times daily. 90 capsule 1   promethazine-dextromethorphan (PROMETHAZINE-DM) 6.25-15 MG/5ML syrup Take 2.5 mLs by mouth 3 (three) times daily as needed for cough. 100 mL 0   sertraline (ZOLOFT) 100 MG tablet Take 1 tablet (100 mg total) by mouth daily. 90 tablet 1   triamcinolone cream (KENALOG) 0.1 % Apply 1 Application topically 2 (two) times daily. 30 g 0   Vitamin D, Ergocalciferol, (DRISDOL) 1.25 MG (50000 UNIT) CAPS capsule Take 1 capsule (50,000 Units total) by mouth every 7 (seven) days. 12 capsule 0   atorvastatin (LIPITOR) 20 MG tablet Take 1 tablet (20 mg total) by mouth once daily. 90 tablet 1   atorvastatin (LIPITOR) 20 MG tablet Take 1 tablet (20 mg total) by mouth daily. 90 tablet 1   famotidine (PEPCID) 20 MG tablet Take 1 tablet (20 mg total) by mouth 2 (two) times daily. 30 tablet 0   acetaminophen (TYLENOL) 500 MG tablet Take 1 tablet (500 mg total) by mouth every 6 (six) hours as needed. (Patient not taking: Reported on 05/15/2022) 30 tablet 0   ammonium lactate (AMLACTIN) 12 % lotion Apply 1 Application topically as needed for dry skin. (Patient not taking: Reported on 05/15/2022) 400 g 0   No facility-administered medications prior to visit.     ROS Review of Systems  Constitutional:  Negative for activity change and appetite change.  HENT:  Negative for sinus pressure and sore throat.   Respiratory:  Negative for chest tightness, shortness of breath and wheezing.   Cardiovascular:  Negative for chest pain and  palpitations.  Gastrointestinal:  Positive for nausea. Negative for abdominal distention, abdominal pain and constipation.  Genitourinary: Negative.   Musculoskeletal: Negative.   Psychiatric/Behavioral:  Negative for behavioral  problems and dysphoric mood.     Objective:  BP (!) 126/90   Pulse (!) 110   Temp 98.3 F (36.8 C) (Oral)   Ht _0  (1.626 m)   Wt 255 lb 3.2 oz (115.8 kg)   SpO2 98%   BMI 43.80 kg/m      05/31/2022    4:29 PM 04/29/2022   10:34 AM 03/23/2022    2:48 PM  BP/Weight  Systolic BP 706 237 628  Diastolic BP 90 83 315  Wt. (Lbs) 255.2    BMI 43.8 kg/m2        Physical Exam Constitutional:      Appearance: She is well-developed. She is obese.  Cardiovascular:     Rate and Rhythm: Normal rate.     Heart sounds: Normal heart sounds. No murmur heard. Pulmonary:     Effort: Pulmonary effort is normal.     Breath sounds: Normal breath sounds. No wheezing or rales.  Chest:     Chest wall: No tenderness.  Abdominal:     General: Bowel sounds are normal. There is no distension.     Palpations: Abdomen is soft. There is no mass.     Tenderness: There is no abdominal tenderness.  Musculoskeletal:        General: Normal range of motion.     Right lower leg: No edema.     Left lower leg: No edema.  Neurological:     Mental Status: She is alert and oriented to person, place, and time.  Psychiatric:        Mood and Affect: Mood normal.        Latest Ref Rng & Units 05/27/2021    6:11 PM 05/08/2021   12:48 PM 10/26/2020    9:08 AM  CMP  Glucose 70 - 99 mg/dL 259  118  179   BUN 6 - 20 mg/dL _1 Creatinine 0.44 - 1.00 mg/dL 0.78  0.60  0.86   Sodium 135 - 145 mmol/L 137  141  137   Potassium 3.5 - 5.1 mmol/L 3.9  3.7  4.8   Chloride 98 - 111 mmol/L 108  106  103   CO2 22 - 32 mmol/L 21   20   Calcium 8.9 - 10.3 mg/dL 8.8   9.6   Total Protein 6.0 - 8.5 g/dL   6.6   Total Bilirubin 0.0 - 1.2 mg/dL   0.5   Alkaline Phos 44 - 121 IU/L   109   AST 0 - 40 IU/L   10   ALT 0 - 32 IU/L   20     Lipid Panel     Component Value Date/Time   CHOL 244 (H) 10/26/2020 0908   TRIG 85 10/26/2020 0908   HDL 50 10/26/2020 0908   CHOLHDL 4.9 (H) 10/26/2020 0908    CHOLHDL 6 03/03/2018 1419   VLDL 37.4 03/03/2018 1419   LDLCALC 179 (H) 10/26/2020 0908    CBC    Component Value Date/Time   WBC 7.2 05/27/2021 1811   RBC 4.64 05/27/2021 1811   HGB 13.7 05/27/2021 1811   HGB 14.5 10/26/2020 0908   HCT 41.0 05/27/2021 1811   HCT 44.5 10/26/2020 0908   PLT 320 05/27/2021 1811   PLT 339 10/26/2020 0908  MCV 88.4 05/27/2021 1811   MCV 90 10/26/2020 0908   MCH 29.5 05/27/2021 1811   MCHC 33.4 05/27/2021 1811   RDW 12.1 05/27/2021 1811   RDW 12.4 10/26/2020 0908   LYMPHSABS 3.3 05/27/2021 1811   LYMPHSABS 3.1 10/26/2020 0908   MONOABS 0.5 05/27/2021 1811   EOSABS 0.1 05/27/2021 1811   EOSABS 0.0 10/26/2020 0908   BASOSABS 0.0 05/27/2021 1811   BASOSABS 0.0 10/26/2020 0908    Lab Results  Component Value Date   HGBA1C 12.2 (H) 10/07/2019   A1c 8.6 from 03/2022 care everywhere Assessment & Plan:  1. Type 2 diabetes mellitus with hyperosmolarity without coma, with long-term current use of insulin (HCC) Uncontrolled with A1c of 8.6; goal is less than 7.0 She will benefit from addition of a GLP-1 RA or Mounjaro Advised to discuss this with endocrinologist Diabetes management as per endocrinology - CMP14+EGFR - Microalbumin/Creatinine Ratio, Urine  2. Gastroesophageal reflux disease, unspecified whether esophagitis present Uncontrolled Continue pantoprazole and PPI added Advised to avoid recumbency up to 2 hours postmeal, avoid late meals, avoid foods that trigger symptoms. - famotidine (PEPCID) 20 MG tablet; Take 1 tablet (20 mg total) by mouth at bedtime.  Dispense: 30 tablet; Refill: 6  3. Hyperlipidemia associated with type 2 diabetes mellitus ([place]) Uncontrolled from lipid panel from 03/2022 Care Everywhere (LDL of 156, total cholesterol 227, HDL of 49) Low-cholesterol diet Atorvastatin dose increased from 20 mg to 40 mg  4. Primary hypertension Diastolic elevation Advised to work on lifestyle modification and we will reassess  blood pressure at next visit and adjust regimen if indicated Counseled on blood pressure goal of less than 130/80, low-sodium, DASH diet, medication compliance, 150 minutes of moderate intensity exercise per week. Discussed medication compliance, adverse effects.   5.  Morbid obesity She has been working on caloric restriction, exercising with no success with regards to weight loss She might benefit from Ozempic or [place] her to discuss this with her endocrinologist  Meds ordered this encounter  Medications   famotidine (PEPCID) 20 MG tablet    Sig: Take 1 tablet (20 mg total) by mouth at bedtime.    Dispense:  30 tablet    Refill:  6   atorvastatin (LIPITOR) 40 MG tablet    Sig: Take 1 tablet (40 mg total) by mouth daily.    Dispense:  90 tablet    Refill:  1    Dose increase    Follow-up: Return in about 1 month (around 07/01/2022) for Pap smear.       Charlott Rakes, MD, FAAFP. [facility] and [facility], [place]   05/31/2022, 5:12 PM

## 2022-06-01 LAB — CMP14+EGFR
ALT: 23 IU/L (ref 0–32)
AST: 15 IU/L (ref 0–40)
Albumin/Globulin Ratio: 1.8 (ref 1.2–2.2)
Albumin: 4.4 g/dL (ref 3.9–4.9)
Alkaline Phosphatase: 112 IU/L (ref 44–121)
BUN/Creatinine Ratio: 20 (ref 9–23)
BUN: 16 mg/dL (ref 6–24)
Bilirubin Total: 0.2 mg/dL (ref 0.0–1.2)
CO2: 23 mmol/L (ref 20–29)
Calcium: 9.7 mg/dL (ref 8.7–10.2)
Chloride: 106 mmol/L (ref 96–106)
Creatinine, Ser: 0.79 mg/dL (ref 0.57–1.00)
Globulin, Total: 2.5 g/dL (ref 1.5–4.5)
Glucose: 157 mg/dL — ABNORMAL HIGH (ref 70–99)
Potassium: 4.1 mmol/L (ref 3.5–5.2)
Sodium: 141 mmol/L (ref 134–144)
Total Protein: 6.9 g/dL (ref 6.0–8.5)
eGFR: 97 mL/min/{1.73_m2} (ref >59)

## 2022-06-01 LAB — MICROALBUMIN / CREATININE URINE RATIO
Creatinine, Urine: 186.2 mg/dL
Microalb/Creat Ratio: 3 mg/g creat (ref 0–29)
Microalbumin, Urine: 6.5 ug/mL

## 2022-06-01 LAB — NOVEL CORONAVIRUS, NAA: SARS-CoV-2, NAA: NOT DETECTED

## 2022-06-07 ENCOUNTER — Ambulatory Visit: Payer: Medicaid Other | Attending: Podiatry

## 2022-06-07 DIAGNOSIS — R2689 Other abnormalities of gait and mobility: Secondary | ICD-10-CM | POA: Insufficient documentation

## 2022-06-07 DIAGNOSIS — R262 Difficulty in walking, not elsewhere classified: Secondary | ICD-10-CM | POA: Insufficient documentation

## 2022-06-07 DIAGNOSIS — M6281 Muscle weakness (generalized): Secondary | ICD-10-CM | POA: Diagnosis present

## 2022-06-07 DIAGNOSIS — R600 Localized edema: Secondary | ICD-10-CM | POA: Insufficient documentation

## 2022-06-07 DIAGNOSIS — M79671 Pain in right foot: Secondary | ICD-10-CM | POA: Insufficient documentation

## 2022-06-07 NOTE — Therapy (Signed)
OUTPATIENT PHYSICAL THERAPY TREATMENT NOTE   Patient Name: Sarah Garrison MRN: [id number] DOB:06/23/1980, 42 y.o., female Today's Date: 06/08/2022  PCP: Charlott Rakes, MD  REFERRING PROVIDER: Felipa Furnace, DPM  END OF SESSION:   PT End of Session - 06/07/22 1619     Visit Number 3    Number of Visits 7    Date for PT Re-Evaluation 06/30/22    Authorization Type MCD- healthy blue    Authorization Time Period 12/13-2/10/24    Authorization - Visit Number 2    Authorization - Number of Visits 6    PT Start Time 8850    PT Stop Time 2774    PT Time Calculation (min) 43 min    Activity Tolerance Patient tolerated treatment well    Behavior During Therapy WFL for tasks assessed/performed              Past Medical History:  Diagnosis Date   Anxiety    Asthma    Chronic constipation    Chronic shortness of breath    Depression    GERD (gastroesophageal reflux disease)    History of COVID-19 09/29/2019   09-28-2020 result in epic; hospital admission 10-05-2019 for covid pneumonia with hypoxic acute respiratory failure, no oxygen needed when discharged 10-06-2019;  and positive 06-13-2020 result in epic, pt stated no symptoms   History of seizures    (05-04-2021  pt stated was told during hospital admission 05/ 2021 with covid , she had 2 seizures,  stated no medication and no seizure since)   Hypertension    Insulin dependent type 2 diabetes mellitus ([place])    endocrinologist-- Jacolyn Reedy NP;   pt uses insulin pump   Insulin pump in place    Mixed hyperlipidemia    OSA (obstructive sleep apnea)    ( 05-04-2021 pt stated has not used cpap since 05/ 2021) followed by dr byrum;  study in epic 02-16-2019 moderate , titrated cpap 08-28-2019   Plantar fasciitis, right    w/ chronic pain   Past Surgical History:  Procedure Laterality Date   [place] Right 05/08/2021   Procedure: ENDOSCOPIC PLANTAR FASCIOTOMY;  Surgeon: Landis Martins, DPM;  Location: [place];  Service: Podiatry;  Laterality: Right;  WITH BLOCK   STERIOD INJECTION Bilateral 05/08/2021   Procedure: PLATELET RICH PLASMA INJECTION HEELS;  Surgeon: Landis Martins, DPM;  Location: [place];  Service: Podiatry;  Laterality: Bilateral;   TUBAL LIGATION Bilateral 09/29/2006   _0 ;   PPTL   VENTRAL HERNIA REPAIR N/A 11/27/2017   Procedure: VENTRAL HERNIA REPAIR ERAS PATHWAY;  Surgeon: Erroll Luna, MD;  Location: [place];  Service: General;  Laterality: N/A;   [place] EXTRACTION     Patient Active Problem List   Diagnosis Date Noted   Palpitations 07/31/2021   Fatigue 07/31/2021   Primary hypertension 07/31/2021   Hip pain 12/08/2019   Hyperlipidemia associated with type 2 diabetes mellitus ([place]) 11/23/2019   History of COVID-19 10/27/2019   Stuttering 10/27/2019   Depression with anxiety 10/27/2019   Obesity, Class III, BMI 40-49.9 (morbid obesity) ([place]) 06/30/2019   OSA (obstructive sleep apnea) 02/16/2019   Essential hypertension 06/22/2018   Type 2 diabetes mellitus with hyperosmolarity without coma, with long-term current use of insulin ([place]) 12/13/2016   Asthma 07/30/2016   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/07/2016   DM neuropathy, type II diabetes mellitus ([place]) 08/24/2015    REFERRING  DIAG: M72.2 (ICD-10-CM) - Plantar fasciitis, right   THERAPY DIAG:  Pain in right foot  Muscle weakness (generalized)  Other abnormalities of gait and mobility  Rationale for Evaluation and Treatment Rehabilitation  PERTINENT HISTORY: Right ENDOSCOPIC PLANTAR FASCIOTOMY 05/08/2021 History of seizures  Diabetes   PRECAUTIONS: none   SUBJECTIVE:                                                                                                                                                                                      SUBJECTIVE STATEMENT:  "It's not that bad today. I am tired"    PAIN:   Are you having pain? Yes: NPRS scale: 5/10 Pain location: Rt heel Pain description: sharp;poking Aggravating factors: weight-bearing Relieving factors: rest   OBJECTIVE: (objective measures completed at initial evaluation unless otherwise dated)  DIAGNOSTIC FINDINGS:  MRI Right Foot IMPRESSION: 1. Findings consistent with moderate plantar fasciitis. No evidence of fascial rupture or significant surrounding soft tissue or marrow edema. 2. Mild midfoot degenerative changes without acute osseous findings. 3. The ankle tendons and ligaments appear unremarkable.   PATIENT SURVEYS:  LEFS: 17/80   COGNITION:           Overall cognitive status: Within functional limits for tasks assessed                          SENSATION: Not tested    EDEMA:  Figure 8 RLE 55 cm LLE 54 cm    MUSCLE LENGTH: Not tested   POSTURE:  In standing: maintains Rt knee in flexion and Rt foot in plantarflexion (will not bear weight through her heel) ;when instructed to place heel on the ground she supinates and will not place medial aspect of the calcaneous on the ground.    PALPATION: Extreme hypersensitivity to light touch about Rt medial calcaneous and achilles tendon Normal capillary refill      LOWER EXTREMITY ROM:   Active ROM Right eval Left eval 05/22/22 Right  Hip flexion       Hip extension       Hip abduction       Hip adduction       Hip internal rotation       Hip external rotation       Knee flexion       Knee extension       Ankle dorsiflexion Lacking 15  3 Lacking 9   Ankle plantarflexion WNL WNL   Ankle inversion 10 15   Ankle eversion 2 13    (Blank rows = not tested) *pain with all AROM on the RLE   LOWER  EXTREMITY MMT:   MMT Right eval Left eval  Hip flexion      Hip extension      Hip abduction      Hip adduction      Hip internal rotation      Hip external rotation      Knee flexion      Knee extension      Ankle dorsiflexion 4- 4+  Ankle  plantarflexion Can complete SL calf raise Can complete SL calf raise  Ankle inversion 4- 4+  Ankle eversion 4- 4+   (Blank rows = not tested) *pain with all MMT on the RLE   LOWER EXTREMITY SPECIAL TESTS:  Not tested    FUNCTIONAL TESTS:  SLS LLE 5 seconds; RLE 2 seconds (maintains foot in plantarflexion)    Sit to stand transfer- significant use of UE support, Modified independent (increased time)    GAIT: Distance walked: 20 ft Assistive device utilized: None Level of assistance: Complete Independence Comments: maintains both feet in plantarflexion during stance phase (will not put heel on the ground)        TODAY'S TREATMENT: [facility] Adult PT Treatment:                                                DATE: 06/07/22 Therapeutic Exercise: Treadmill speed 0.6 x 5 minutes  DL standing focusing on neutral foot alignment x 3 minutes SL stance on the RLE with LLE on stability ball 4 x 30 sec  Resisted ankle DF green band 2 x 10  Leg press SL RLE 2 x 10 @ 20 lbs  Sit to stand x 10  Updated HEP  Manual Therapy: Desensitization Rt foot with towel and tennis ball  Passive gastroc stretching     [facility] Adult PT Treatment:                                                DATE: 05/22/22 Therapeutic Exercise: NuStep level 4 x 5 minutes Toe raises 2 x 10 Weight bearing through entire Rt foot with overpressure 3 x 30 sec in sitting and standing  Calf stretch with strap 2 x 30 sec  Supine HS stretch with strap 2 x 15 sec  Resisted ankle DF red band 2 x 10 Updated HEP Manual Therapy: Desensitization Rt foot Passive gastroc stretching     [facility] Adult PT Treatment:                                                DATE: 05/15/22 Therapeutic Exercise: Demonstrated and issue initial HEP.      Therapeutic Activity: Education on assessment findings that will be addressed throughout duration of POC.            PATIENT EDUCATION:  Education details: see above Person educated:  Patient Education method: Explanation, Demonstration, Tactile cues, Verbal cues, and Handouts Education comprehension: verbalized understanding, returned demonstration, verbal cues required, tactile cues required, and needs further education     HOME EXERCISE PROGRAM: Access Code: [email or web address] URL: [email or web address] Date: 05/15/2022 Prepared by: Gwendolyn Grant   Exercises -  Supine Ankle Pumps  - 3 x daily - 7 x weekly - 2 sets - 10 reps - Seated Heel Toe Raises  - 3 x daily - 7 x weekly - 3 sets - 10 reps - Long Sitting Calf Stretch with Strap  - 3 x daily - 7 x weekly - 3 sets - 30 sec hold - Towel Desensitization  - 3 x daily - 7 x weekly - 5 minutes hold   ASSESSMENT:   CLINICAL IMPRESSION: Patient has improved tolerance to desensitization to the Rt foot with ability to utilize towel and tennis ball for this technique today with light pressure. Worked on allowing for Rt foot flat during weight-bearing activity with patient able to tolerate DL stance for 3 minutes and 30 seconds of Rt SLS with neutral foot alignment. She is able to perform sit to stand without use of UE support, though maintains Rt foot in plantarflexion when performing this activity. HEP was updated to include further strengthening and balance activity.    OBJECTIVE IMPAIRMENTS Abnormal gait, decreased activity tolerance, decreased balance, decreased knowledge of condition, decreased mobility, difficulty walking, decreased ROM, decreased strength, increased edema, increased fascial restrictions, impaired flexibility, impaired sensation, improper body mechanics, postural dysfunction, and pain.    ACTIVITY LIMITATIONS carrying, lifting, bending, standing, squatting, sleeping, stairs, transfers, and locomotion level   PARTICIPATION LIMITATIONS: meal prep, cleaning, laundry, shopping, community activity, occupation, and yard work   PERSONAL FACTORS Fitness, Time since onset of injury/illness/exacerbation, and  3+ comorbidities: see PMH above  are also affecting patient's functional outcome.    REHAB POTENTIAL: Fair has previously completed PT for same issue   CLINICAL DECISION MAKING: Evolving/moderate complexity   EVALUATION COMPLEXITY: Moderate     GOALS: Goals reviewed with patient? Yes   SHORT TERM GOALS: Target date: 06/04/2022     Patient will be able to achieve neutral foot flat alignment in standing and tolerate for at least 3 minutes to improve her ability to complete household tasks.  Baseline: unable Goal status: met   2.  Patient will be able to complete sit to stand transfer without use of UE to improve her ability to transfer out of bathtub.  Baseline: Mod I with significant use of UE support.  Goal status: met   3.  Patient will improve ankle dorsiflexion AROM by at least 10 degrees to improve gait mechanics.  Baseline: lacking 15 Goal status: ongoing        LONG TERM GOALS: Target date: 06/25/2022       Patient will be able to achieve foot flat during stance phase of gait cycle.  Baseline: unable Goal status: INITIAL   2.  Patient will demonstrate at least 4+/5 strength in the Rt ankle to improve stability with walking activity.  Baseline: see above Goal status: INITIAL   3.  Patient will improve Rt ankle inversion and eversion AROM by at least 5 degrees to improve gait mechanics.  Baseline: see above Goal status: INITIAL   4. Patient will score at least 35/80 on LEFS to signify clinically meaningful improvement in functional abilities.    Baseline: see above Goal status: INITIAL   5. Patient will maintain SLS on the RLE for at least 5 seconds to improve gait stability.             Baseline: see above            Goal Status: INITIAL      PLAN: PT FREQUENCY: 1x/week   PT DURATION: 6  weeks   PLANNED INTERVENTIONS: Therapeutic exercises, Therapeutic activity, Neuromuscular re-education, Balance training, Gait training, Patient/Family education, Self  Care, Joint mobilization, Stair training, Aquatic Therapy, Dry Needling, Cryotherapy, Moist heat, Vasopneumatic device, Ultrasound, Ionotophoresis 30m/ml Dexamethasone, Manual therapy, and Re-evaluation   PLAN FOR NEXT SESSION: desensitization, calf stretching, gentle ankle mobility/strengthening, gait training, balance   SGwendolyn Grant PT, DPT, ATC 06/08/22 8:32 AM

## 2022-06-08 ENCOUNTER — Ambulatory Visit: Payer: Medicaid Other | Admitting: Podiatry

## 2022-06-08 ENCOUNTER — Telehealth: Payer: Self-pay | Admitting: *Deleted

## 2022-06-08 VITALS — BP 126/84

## 2022-06-08 DIAGNOSIS — G5751 Tarsal tunnel syndrome, right lower limb: Secondary | ICD-10-CM | POA: Diagnosis not present

## 2022-06-08 DIAGNOSIS — G90521 Complex regional pain syndrome I of right lower limb: Secondary | ICD-10-CM

## 2022-06-08 NOTE — Progress Notes (Signed)
Subjective:  Patient ID: Sarah Garrison, female    DOB: 06-28-1980,  MRN: [id number]  Chief Complaint  Patient presents with   Plantar Fasciitis    DOS: 05/08/2021 Procedure: Right EPF  42 y.o. female returns for post-op check.  Patient states that physical therapy is doing okay but is she is having a lot of nervelike pain.  She wanted to discuss that further.  She is going to physical therapy.  Review of Systems: Negative except as noted in the HPI. Denies N/V/F/Ch.  Past Medical History:  Diagnosis Date   Anxiety    Asthma    Chronic constipation    Chronic shortness of breath    Depression    GERD (gastroesophageal reflux disease)    History of COVID-19 09/29/2019   09-28-2020 result in epic; hospital admission 10-05-2019 for covid pneumonia with hypoxic acute respiratory failure, no oxygen needed when discharged 10-06-2019;  and positive 06-13-2020 result in epic, pt stated no symptoms   History of seizures    (05-04-2021  pt stated was told during hospital admission 05/ 2021 with covid , she had 2 seizures,  stated no medication and no seizure since)   Hypertension    Insulin dependent type 2 diabetes mellitus [facility])    endocrinologist-- Jacolyn Reedy NP;   pt uses insulin pump   Insulin pump in place    Mixed hyperlipidemia    OSA (obstructive sleep apnea)    ( 05-04-2021 pt stated has not used cpap since 05/ 2021) followed by dr byrum;  study in epic 02-16-2019 moderate , titrated cpap 08-28-2019   Plantar fasciitis, right    w/ chronic pain    Current Outpatient Medications:    acetaminophen (TYLENOL) 500 MG tablet, Take 1 tablet (500 mg total) by mouth every 6 (six) hours as needed. (Patient not taking: Reported on 05/15/2022), Disp: 30 tablet, Rfl: 0   ammonium lactate (AMLACTIN) 12 % lotion, Apply 1 Application topically as needed for dry skin. (Patient not taking: Reported on 05/15/2022), Disp: 400 g, Rfl: 0   atorvastatin (LIPITOR) 40 MG tablet, Take 1 tablet  (40 mg total) by mouth daily., Disp: 90 tablet, Rfl: 1   benzonatate (TESSALON) 100 MG capsule, Take 1 capsule (100 mg total) by mouth 3 (three) times daily as needed for cough., Disp: 30 capsule, Rfl: 0   Continuous Blood Gluc Sensor (DEXCOM G6 SENSOR) MISC, USE AND CHANGE SENSOR EVERY 10 DAYS AS DIRECTED, Disp: , Rfl:    Continuous Blood Gluc Sensor (DEXCOM G6 SENSOR) MISC, SMARTSIG:Topical Every 10 Days, Disp: , Rfl:    Continuous Blood Gluc Transmit (DEXCOM G6 TRANSMITTER) MISC, USE TO CHECK BLOOD SUGAR EVERY DAY. CHANGE EVERY 3 MONTHS AS DIRECTED, Disp: , Rfl:    cyclobenzaprine (FLEXERIL) 10 MG tablet, Take 1 tablet (10 mg total) by mouth at bedtime., Disp: 30 tablet, Rfl: 0   diclofenac (FLECTOR) 1.3 % PTCH, Place 1 patch onto the skin 2 (two) times daily., Disp: 60 patch, Rfl: 1   famotidine (PEPCID) 20 MG tablet, Take 1 tablet (20 mg total) by mouth at bedtime., Disp: 30 tablet, Rfl: 6   fluticasone (FLONASE) 50 MCG/ACT nasal spray, Place 2 sprays into both nostrils daily., Disp: 16 g, Rfl: 1   hydrocortisone 2.5 % lotion, Apply topically 2 (two) times daily., Disp: 59 mL, Rfl: 0   hydrOXYzine (ATARAX) 25 MG tablet, Take 1 tablet (25 mg total) by mouth every 8 (eight) hours as needed for itching., Disp: 20 tablet, Rfl: 0  ibuprofen (ADVIL) 800 MG tablet, Take 1 tablet (800 mg total) by mouth every 8 (eight) hours as needed., Disp: 30 tablet, Rfl: 0   insulin aspart (NOVOLOG FLEXPEN) 100 UNIT/ML FlexPen, Inject into the skin 3 (three) times daily before meals. ONLY WHEN INSULIN PUMP MALFUNCTION'S PER ENDOCRINOLOGY DIRECTION, Disp: , Rfl:    insulin aspart (NOVOLOG) 100 UNIT/ML injection, USE VIA INSULIN PUMP. MAX TOTAL DAILY DOSE OF 200 UNITS, Disp: 90 mL, Rfl: 5   insulin aspart (NOVOLOG) 100 UNIT/ML injection, USE VIA INSULIN PUMP. MAX TOTAL DAILY DOSE OF 200 UNITS, Disp: 90 mL, Rfl: 5   insulin glargine, 1 Unit Dial, (TOUJEO SOLOSTAR) 300 UNIT/ML Solostar Pen, Administer 50 units once  daily only if insulin pump malfunction, Disp: 9 mL, Rfl: 1   Insulin Human (INSULIN PUMP) SOLN, Inject 1 each into the skin See admin instructions. Medication: Novolog 100 units/ml injection. Takes 100 units via pump per patient., Disp: , Rfl:    Insulin Syringe-Needle U-100 31G X 5/16" 1 ML MISC, inject 10 units into the skin 3 times daily. use to inject novolog based on insulin to card ratio- max daily dose 150 units, Disp: 100 each, Rfl: 2   levocetirizine (XYZAL ALLERGY 24HR) 5 MG tablet, Take 1 tablet (5 mg total) by mouth every evening., Disp: 90 tablet, Rfl: 0   linaclotide (LINZESS) 72 MCG capsule, Take 1 capsule (72 mcg total) by mouth daily before breakfast., Disp: 30 capsule, Rfl: 3   lisinopril (ZESTRIL) 20 MG tablet, Take 1 tablet (20 mg total) by mouth once daily., Disp: 90 tablet, Rfl: 2   lisinopril (ZESTRIL) 20 MG tablet, Take 1 tablet (20 mg total) by mouth daily., Disp: 90 tablet, Rfl: 2   meclizine (ANTIVERT) 12.5 MG tablet, Take 1 tablet (12.5 mg total) by mouth 2 (two) times daily as needed (Motion sickness)., Disp: 30 tablet, Rfl: 0   meloxicam (MOBIC) 7.5 MG tablet, Take 1 tablet (7.5 mg total) by mouth 2 (two) times daily with a meal., Disp: 15 tablet, Rfl: 0   methylPREDNISolone (MEDROL DOSEPAK) 4 MG TBPK tablet, Take as directed, Disp: 21 each, Rfl: 0   Misc. Devices MISC, CPAP therapy on autopap 5-15.  Needs Small size [employer] Full Face Mask Simplus  mask and heated humidification., Disp: 1 each, Rfl: 0   olopatadine (PATANOL) 0.1 % ophthalmic solution, Place 1 drop into both eyes 2 (two) times daily., Disp: 5 mL, Rfl: 1   ondansetron (ZOFRAN ODT) 8 MG disintegrating tablet, Take 1 tablet (8 mg total) by mouth every 8 (eight) hours as needed for nausea or vomiting., Disp: 20 tablet, Rfl: 0   pantoprazole (PROTONIX) 40 MG tablet, TAKE 1 TABLET (40 MG TOTAL) BY MOUTH DAILY., Disp: 90 tablet, Rfl: 1   predniSONE (DELTASONE) 20 MG tablet, Take 2 tablets daily with  breakfast., Disp: 10 tablet, Rfl: 0   pregabalin (LYRICA) 75 MG capsule, Take 1 capsule (75 mg total) by mouth 2 (two) times daily., Disp: 90 capsule, Rfl: 1   promethazine-dextromethorphan (PROMETHAZINE-DM) 6.25-15 MG/5ML syrup, Take 2.5 mLs by mouth 3 (three) times daily as needed for cough., Disp: 100 mL, Rfl: 0   sertraline (ZOLOFT) 100 MG tablet, Take 1 tablet (100 mg total) by mouth daily., Disp: 90 tablet, Rfl: 1   triamcinolone cream (KENALOG) 0.1 %, Apply 1 Application topically 2 (two) times daily., Disp: 30 g, Rfl: 0   Vitamin D, Ergocalciferol, (DRISDOL) 1.25 MG (50000 UNIT) CAPS capsule, Take 1 capsule (50,000 Units total) by mouth  every 7 (seven) days., Disp: 12 capsule, Rfl: 0  Social History   Tobacco Use  Smoking Status Former   Packs/day: 0.10   Years: 2.00   Total pack years: 0.20   Types: Cigarettes   Quit date: 11/27/2017   Years since quitting: 4.5  Smokeless Tobacco Never    Allergies  Allergen Reactions   Morphine And Related Shortness Of Breath   Glyburide Diarrhea and Other (See Comments)    Reaction:  Nose bleeds    Hydrocodone Other (See Comments)    Pt states that this medication makes her "dream about rabbits chasing her"   Ivp Dye [Iodinated Contrast Media] Other (See Comments)    Shortness of breath.     Oxycodone Other (See Comments)    Pt states that this medication makes her "dream about rabbits chasing" her.     Objective:   Vitals:   06/08/22 0832  BP: 126/84   There is no height or weight on file to calculate BMI. Constitutional Well developed. Well nourished.  Vascular Foot warm and well perfused. Capillary refill normal to all digits.   Neurologic Normal speech. Oriented to person, place, and time. Epicritic sensation to light touch grossly present bilaterally.  Dermatologic Skin completely reepithelialized.  Recurrence of [place] fasciitis clinically appreciated.  Mild pain on palpation to the calcaneal tuber positive [place]  trace with gastrocnemius equinus  Positive Tinel's sign to tarsal tunnel and common peroneal nerve as it curves near the fibular head.  Orthopedic: Tenderness to palpation noted about the surgical site.   Radiographs: None Assessment:   1. Tarsal tunnel syndrome of right side   2. Complex regional pain syndrome type 1 of right lower extremity     Plan:  Patient was evaluated and treated and all questions answered.  Right Planter fasciitis recurrence -Progressing as expected post-operatively. -Manageable however she is experiencing more nervelike pain at this time.  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 -Ultimately she will benefit from nerve conduction study to evaluate the nerve down the right lower extremity. -Given that she is having positive Tinel's sign to the tarsal tunnel as well as common peroneal it may likely be due to lower back -She will also benefit from pain management as well.  No follow-ups on file.

## 2022-06-08 NOTE — Telephone Encounter (Signed)
-----   Message from Felipa Furnace, DPM sent at 06/08/2022  8:49 AM EST ----- Regarding: Referral to pain management Hi Camille Thau,  Can you refer this patient to pain management I placed the order in.

## 2022-06-12 ENCOUNTER — Encounter: Payer: Self-pay | Admitting: Physical Medicine and Rehabilitation

## 2022-06-14 ENCOUNTER — Ambulatory Visit: Payer: Medicaid Other

## 2022-06-14 DIAGNOSIS — M6281 Muscle weakness (generalized): Secondary | ICD-10-CM

## 2022-06-14 DIAGNOSIS — M79671 Pain in right foot: Secondary | ICD-10-CM | POA: Diagnosis not present

## 2022-06-14 DIAGNOSIS — R2689 Other abnormalities of gait and mobility: Secondary | ICD-10-CM

## 2022-06-14 DIAGNOSIS — R600 Localized edema: Secondary | ICD-10-CM

## 2022-06-14 DIAGNOSIS — R262 Difficulty in walking, not elsewhere classified: Secondary | ICD-10-CM

## 2022-06-14 NOTE — Therapy (Signed)
OUTPATIENT PHYSICAL THERAPY TREATMENT NOTE   Patient Name: Sarah Garrison MRN: [id number] DOB:[birth year], 42 y.o., female Today's Date: 06/15/2022  PCP: Charlott Rakes, MD  REFERRING PROVIDER: Felipa Furnace, DPM  END OF SESSION:   PT End of Session - 06/14/22 1623     Visit Number 4    Number of Visits 7    Date for PT Re-Evaluation 06/30/22    Authorization Type MCD- healthy blue    Authorization Time Period 12/13-2/10/24    Authorization - Visit Number 3    Authorization - Number of Visits 6    PT Start Time 8889    PT Stop Time 1694    PT Time Calculation (min) 42 min    Activity Tolerance Patient tolerated treatment well    Behavior During Therapy WFL for tasks assessed/performed               Past Medical History:  Diagnosis Date   Anxiety    Asthma    Chronic constipation    Chronic shortness of breath    Depression    GERD (gastroesophageal reflux disease)    History of COVID-19 09/29/2019   09-28-2020 result in epic; hospital admission 10-05-2019 for covid pneumonia with hypoxic acute respiratory failure, no oxygen needed when discharged 10-06-2019;  and positive 06-13-2020 result in epic, pt stated no symptoms   History of seizures    (05-04-2021  pt stated was told during hospital admission 05/ 2021 with covid , she had 2 seizures,  stated no medication and no seizure since)   Hypertension    Insulin dependent type 2 diabetes mellitus ([place])    endocrinologist-- Jacolyn Reedy NP;   pt uses insulin pump   Insulin pump in place    Mixed hyperlipidemia    OSA (obstructive sleep apnea)    ( 05-04-2021 pt stated has not used cpap since 05/ 2021) followed by dr byrum;  study in epic 02-16-2019 moderate , titrated cpap 08-28-2019   Plantar fasciitis, right    w/ chronic pain   Past Surgical History:  Procedure Laterality Date   [place] Right 05/08/2021   Procedure: ENDOSCOPIC PLANTAR FASCIOTOMY;  Surgeon:  Landis Martins, DPM;  Location: [facility];  Service: Podiatry;  Laterality: Right;  WITH BLOCK   STERIOD INJECTION Bilateral 05/08/2021   Procedure: PLATELET RICH PLASMA INJECTION HEELS;  Surgeon: Landis Martins, DPM;  Location: [facility];  Service: Podiatry;  Laterality: Bilateral;   TUBAL LIGATION Bilateral 09/29/2006   @[facility] ;   [facility]   VENTRAL HERNIA REPAIR N/A 11/27/2017   Procedure: VENTRAL HERNIA REPAIR ERAS PATHWAY;  Surgeon: Erroll Luna, MD;  Location: [facility];  Service: General;  Laterality: N/A;   [place] EXTRACTION     Patient Active Problem List   Diagnosis Date Noted   Palpitations 07/31/2021   Fatigue 07/31/2021   Primary hypertension 07/31/2021   Hip pain 12/08/2019   Hyperlipidemia associated with type 2 diabetes mellitus ([place]) 11/23/2019   History of COVID-19 10/27/2019   Stuttering 10/27/2019   Depression with anxiety 10/27/2019   Obesity, Class III, BMI 40-49.9 (morbid obesity) ([place]) 06/30/2019   OSA (obstructive sleep apnea) 02/16/2019   Essential hypertension 06/22/2018   Type 2 diabetes mellitus with hyperosmolarity without coma, with long-term current use of insulin ([place]) 12/13/2016   Asthma 07/30/2016   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/07/2016   DM neuropathy, type II diabetes mellitus ([place]) 08/24/2015  REFERRING DIAG: M72.2 (ICD-10-CM) - Plantar fasciitis, right   THERAPY DIAG:  Pain in right foot  Muscle weakness (generalized)  Other abnormalities of gait and mobility  Localized edema  Difficulty in walking, not elsewhere classified  Rationale for Evaluation and Treatment Rehabilitation  PERTINENT HISTORY: Right ENDOSCOPIC PLANTAR FASCIOTOMY 05/08/2021 History of seizures  Diabetes   PRECAUTIONS: none   SUBJECTIVE:                                                                                                                                                                                       SUBJECTIVE STATEMENT:  "It is hurting. The doctor gave me a patch and said it was my nerve."   PAIN:  Are you having pain? Yes: NPRS scale: 5/10 Pain location: Rt heel Pain description: sharp;poking Aggravating factors: weight-bearing Relieving factors: rest   OBJECTIVE: (objective measures completed at initial evaluation unless otherwise dated)  DIAGNOSTIC FINDINGS:  MRI Right Foot IMPRESSION: 1. Findings consistent with moderate plantar fasciitis. No evidence of fascial rupture or significant surrounding soft tissue or marrow edema. 2. Mild midfoot degenerative changes without acute osseous findings. 3. The ankle tendons and ligaments appear unremarkable.   PATIENT SURVEYS:  LEFS: 17/80   COGNITION:           Overall cognitive status: Within functional limits for tasks assessed                          SENSATION: Not tested    EDEMA:  Figure 8 RLE 55 cm LLE 54 cm    MUSCLE LENGTH: Not tested   POSTURE:  In standing: maintains Rt knee in flexion and Rt foot in plantarflexion (will not bear weight through her heel) ;when instructed to place heel on the ground she supinates and will not place medial aspect of the calcaneous on the ground.    PALPATION: Extreme hypersensitivity to light touch about Rt medial calcaneous and achilles tendon Normal capillary refill      LOWER EXTREMITY ROM:   Active ROM Right eval Left eval 05/22/22 Right 06/14/22 Right   Hip flexion        Hip extension        Hip abduction        Hip adduction        Hip internal rotation        Hip external rotation        Knee flexion        Knee extension        Ankle dorsiflexion Lacking 15  3 Lacking 9  Lacking 1   Ankle plantarflexion WNL  WNL    Ankle inversion 10 15    Ankle eversion 2 13     (Blank rows = not tested) *pain with all AROM on the RLE   LOWER EXTREMITY MMT:   MMT Right eval Left eval  Hip flexion      Hip extension      Hip abduction      Hip adduction       Hip internal rotation      Hip external rotation      Knee flexion      Knee extension      Ankle dorsiflexion 4- 4+  Ankle plantarflexion Can complete SL calf raise Can complete SL calf raise  Ankle inversion 4- 4+  Ankle eversion 4- 4+   (Blank rows = not tested) *pain with all MMT on the RLE   LOWER EXTREMITY SPECIAL TESTS:  Not tested    FUNCTIONAL TESTS:  SLS LLE 5 seconds; RLE 2 seconds (maintains foot in plantarflexion)    Sit to stand transfer- significant use of UE support, Modified independent (increased time)    GAIT: Distance walked: 20 ft Assistive device utilized: None Level of assistance: Complete Independence Comments: maintains both feet in plantarflexion during stance phase (will not put heel on the ground)        TODAY'S TREATMENT: [facility] Adult PT Treatment:                                                DATE: 06/14/22 Therapeutic Exercise: Elliptical level 1 x 3 minutes  Calf stretch on wedge x 1 minute Wall squats x 10   Manual Therapy: Desensitization Rt foot with towel  Passive gastroc stretching  Heel cup tape job  Gait training: Forward step overs focusing on heel strike and foot flat stance Gait training working on step through pattern with heel strike.    [facility] Adult PT Treatment:                                                DATE: 06/07/22 Therapeutic Exercise: Treadmill speed 0.6 x 5 minutes  DL standing focusing on neutral foot alignment x 3 minutes SL stance on the RLE with LLE on stability ball 4 x 30 sec  Resisted ankle DF green band 2 x 10  Leg press SL RLE 2 x 10 @ 20 lbs  Sit to stand x 10  Updated HEP  Manual Therapy: Desensitization Rt foot with towel and tennis ball  Passive gastroc stretching     [facility] Adult PT Treatment:                                                DATE: 05/22/22 Therapeutic Exercise: NuStep level 4 x 5 minutes Toe raises 2 x 10 Weight bearing through entire Rt foot with overpressure 3 x 30 sec  in sitting and standing  Calf stretch with strap 2 x 30 sec  Supine HS stretch with strap 2 x 15 sec  Resisted ankle DF red band 2 x 10 Updated HEP Manual Therapy: Desensitization Rt foot Passive gastroc  stretching        PATIENT EDUCATION:  Education details: see above Person educated: Patient Education method: Explanation, Demonstration, Tactile cues, Verbal cues, and Handouts Education comprehension: verbalized understanding, returned demonstration, verbal cues required, tactile cues required, and needs further education     HOME EXERCISE PROGRAM: Access Code: [email or web address] URL: [email or web address] Date: 05/15/2022 Prepared by: Gwendolyn Grant   Exercises - Supine Ankle Pumps  - 3 x daily - 7 x weekly - 2 sets - 10 reps - Seated Heel Toe Raises  - 3 x daily - 7 x weekly - 3 sets - 10 reps - Long Sitting Calf Stretch with Strap  - 3 x daily - 7 x weekly - 3 sets - 30 sec hold - Towel Desensitization  - 3 x daily - 7 x weekly - 5 minutes hold   ASSESSMENT:   CLINICAL IMPRESSION: Patient arrives with moderate pain about the Rt heel. Continued with desensitization technique, which she tolerates fairly well. Worked on gait training with patient able to heel strike during forward step overs, but has difficulty achieving neutral foot during stance as she has tendency to remain supinated. Little carryover of heel strike with gait training after completing forward step overs as patient reverts to maintaining plantarflexion during stance phase. Trial heel cup taping as patient reported she was interested in wearing a heel cup, but patient had very poor tolerance to taping of the foot due to hypersensitivity. Her DF AROM has significantly improved, nearing neutral.    OBJECTIVE IMPAIRMENTS Abnormal gait, decreased activity tolerance, decreased balance, decreased knowledge of condition, decreased mobility, difficulty walking, decreased ROM, decreased strength, increased edema,  increased fascial restrictions, impaired flexibility, impaired sensation, improper body mechanics, postural dysfunction, and pain.    ACTIVITY LIMITATIONS carrying, lifting, bending, standing, squatting, sleeping, stairs, transfers, and locomotion level   PARTICIPATION LIMITATIONS: meal prep, cleaning, laundry, shopping, community activity, occupation, and yard work   PERSONAL FACTORS Fitness, Time since onset of injury/illness/exacerbation, and 3+ comorbidities: see PMH above  are also affecting patient's functional outcome.    REHAB POTENTIAL: Fair has previously completed PT for same issue   CLINICAL DECISION MAKING: Evolving/moderate complexity   EVALUATION COMPLEXITY: Moderate     GOALS: Goals reviewed with patient? Yes   SHORT TERM GOALS: Target date: 06/04/2022     Patient will be able to achieve neutral foot flat alignment in standing and tolerate for at least 3 minutes to improve her ability to complete household tasks.  Baseline: unable Goal status: met   2.  Patient will be able to complete sit to stand transfer without use of UE to improve her ability to transfer out of bathtub.  Baseline: Mod I with significant use of UE support.  Goal status: met   3.  Patient will improve ankle dorsiflexion AROM by at least 10 degrees to improve gait mechanics.  Baseline: lacking 15 Goal status: met         LONG TERM GOALS: Target date: 06/25/2022       Patient will be able to achieve foot flat during stance phase of gait cycle.  Baseline: unable Goal status: INITIAL   2.  Patient will demonstrate at least 4+/5 strength in the Rt ankle to improve stability with walking activity.  Baseline: see above Goal status: INITIAL   3.  Patient will improve Rt ankle inversion and eversion AROM by at least 5 degrees to improve gait mechanics.  Baseline: see above Goal status: INITIAL   4. Patient will  score at least 35/80 on LEFS to signify clinically meaningful improvement in  functional abilities.    Baseline: see above Goal status: INITIAL   5. Patient will maintain SLS on the RLE for at least 5 seconds to improve gait stability.             Baseline: see above            Goal Status: INITIAL      PLAN: PT FREQUENCY: 1x/week   PT DURATION: 6 weeks   PLANNED INTERVENTIONS: Therapeutic exercises, Therapeutic activity, Neuromuscular re-education, Balance training, Gait training, Patient/Family education, Self Care, Joint mobilization, Stair training, Aquatic Therapy, Dry Needling, Cryotherapy, Moist heat, Vasopneumatic device, Ultrasound, Ionotophoresis 4mg /ml Dexamethasone, Manual therapy, and Re-evaluation   PLAN FOR NEXT SESSION: desensitization, calf stretching, gentle ankle mobility/strengthening, gait training, balance   , PT, DPT, ATC 06/15/22 8:27 AM

## 2022-06-21 ENCOUNTER — Ambulatory Visit: Payer: Medicaid Other

## 2022-06-21 DIAGNOSIS — R2689 Other abnormalities of gait and mobility: Secondary | ICD-10-CM

## 2022-06-21 DIAGNOSIS — M6281 Muscle weakness (generalized): Secondary | ICD-10-CM

## 2022-06-21 DIAGNOSIS — M79671 Pain in right foot: Secondary | ICD-10-CM

## 2022-06-21 DIAGNOSIS — R262 Difficulty in walking, not elsewhere classified: Secondary | ICD-10-CM

## 2022-06-21 DIAGNOSIS — R600 Localized edema: Secondary | ICD-10-CM

## 2022-06-21 NOTE — Therapy (Signed)
OUTPATIENT PHYSICAL THERAPY TREATMENT NOTE   Patient Name: Sarah Garrison MRN: [id number] DOB:1981-06-02, 42 y.o., female Today's Date: 06/22/2022  PCP: Hoy Register, MD  REFERRING PROVIDER: Candelaria Stagers, DPM  END OF SESSION:   PT End of Session - 06/21/22 1617     Visit Number 5    Number of Visits 7    Date for PT Re-Evaluation 06/30/22    Authorization Type MCD- healthy blue    Authorization Time Period 12/13-2/10/24    Authorization - Visit Number 4    Authorization - Number of Visits 6    PT Start Time 1616    PT Stop Time 1657    PT Time Calculation (min) 41 min    Activity Tolerance Patient tolerated treatment well    Behavior During Therapy [facility] for tasks assessed/performed                Past Medical History:  Diagnosis Date   Anxiety    Asthma    Chronic constipation    Chronic shortness of breath    Depression    GERD (gastroesophageal reflux disease)    History of COVID-19 09/29/2019   09-28-2020 result in epic; hospital admission 10-05-2019 for covid pneumonia with hypoxic acute respiratory failure, no oxygen needed when discharged 10-06-2019;  and positive 06-13-2020 result in epic, pt stated no symptoms   History of seizures    (05-04-2021  pt stated was told during hospital admission 05/ 2021 with covid , she had 2 seizures,  stated no medication and no seizure since)   Hypertension    Insulin dependent type 2 diabetes mellitus (HCC)    endocrinologist-- Fredia Sorrow NP;   pt uses insulin pump   Insulin pump in place    Mixed hyperlipidemia    OSA (obstructive sleep apnea)    ( 05-04-2021 pt stated has not used cpap since 05/ 2021) followed by dr byrum;  study in epic 02-16-2019 moderate , titrated cpap 08-28-2019   Plantar fasciitis, right    w/ chronic pain   Past Surgical History:  Procedure Laterality Date   CESAREAN SECTION  1999   PLANTAR FASCIA RELEASE Right 05/08/2021   Procedure: ENDOSCOPIC PLANTAR FASCIOTOMY;  Surgeon:  Asencion Islam, DPM;  Location: [facility];  Service: Podiatry;  Laterality: Right;  WITH BLOCK   STERIOD INJECTION Bilateral 05/08/2021   Procedure: PLATELET RICH PLASMA INJECTION HEELS;  Surgeon: Asencion Islam, DPM;  Location: [facility];  Service: Podiatry;  Laterality: Bilateral;   TUBAL LIGATION Bilateral 09/29/2006   @[facility] ;   [facility]   VENTRAL HERNIA REPAIR N/A 11/27/2017   Procedure: VENTRAL HERNIA REPAIR ERAS PATHWAY;  Surgeon: 11/29/2017, MD;  Location: [facility];  Service: General;  Laterality: N/A;   WISDOM TOOTH EXTRACTION     Patient Active Problem List   Diagnosis Date Noted   Palpitations 07/31/2021   Fatigue 07/31/2021   Primary hypertension 07/31/2021   Hip pain 12/08/2019   Hyperlipidemia associated with type 2 diabetes mellitus (HCC) 11/23/2019   History of COVID-19 10/27/2019   Stuttering 10/27/2019   Depression with anxiety 10/27/2019   Obesity, Class III, BMI 40-49.9 (morbid obesity) (HCC) 06/30/2019   OSA (obstructive sleep apnea) 02/16/2019   Essential hypertension 06/22/2018   Type 2 diabetes mellitus with hyperosmolarity without coma, with long-term current use of insulin (HCC) 12/13/2016   Asthma 07/30/2016   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/07/2016   DM neuropathy, type II diabetes mellitus (HCC) 08/24/2015  REFERRING DIAG: M72.2 (ICD-10-CM) - Plantar fasciitis, right   THERAPY DIAG:  Pain in right foot  Muscle weakness (generalized)  Other abnormalities of gait and mobility  Localized edema  Difficulty in walking, not elsewhere classified  Rationale for Evaluation and Treatment Rehabilitation  PERTINENT HISTORY: Right ENDOSCOPIC PLANTAR FASCIOTOMY 05/08/2021 History of seizures  Diabetes   PRECAUTIONS: none   SUBJECTIVE:                                                                                                                                                                                       SUBJECTIVE STATEMENT:  Patient reports her Rt anterior hip started hurting the night after last session without known cause. She reports the foot is hurting more because she is walking different due to the hip pain.    PAIN:  Are you having pain? Yes: NPRS scale: 8/10 Pain location: Rt heel and Rt hip Pain description: sharp;poking;pressure;ache Aggravating factors: walking,standing Relieving factors: rest   OBJECTIVE: (objective measures completed at initial evaluation unless otherwise dated)  DIAGNOSTIC FINDINGS:  MRI Right Foot IMPRESSION: 1. Findings consistent with moderate plantar fasciitis. No evidence of fascial rupture or significant surrounding soft tissue or marrow edema. 2. Mild midfoot degenerative changes without acute osseous findings. 3. The ankle tendons and ligaments appear unremarkable.   PATIENT SURVEYS:  LEFS: 17/80   COGNITION:           Overall cognitive status: Within functional limits for tasks assessed                          SENSATION: Not tested    EDEMA:  Figure 8 RLE 55 cm LLE 54 cm    MUSCLE LENGTH: Not tested   POSTURE:  In standing: maintains Rt knee in flexion and Rt foot in plantarflexion (will not bear weight through her heel) ;when instructed to place heel on the ground she supinates and will not place medial aspect of the calcaneous on the ground.    PALPATION: Extreme hypersensitivity to light touch about Rt medial calcaneous and achilles tendon Normal capillary refill      LOWER EXTREMITY ROM:   Active ROM Right eval Left eval 05/22/22 Right 06/14/22 Right   Hip flexion        Hip extension        Hip abduction        Hip adduction        Hip internal rotation        Hip external rotation        Knee flexion        Knee extension  Ankle dorsiflexion Lacking 15  3 Lacking 9  Lacking 1   Ankle plantarflexion WNL WNL    Ankle inversion 10 15    Ankle eversion 2 13     (Blank rows = not tested) *pain with  all AROM on the RLE   LOWER EXTREMITY MMT:   MMT Right eval Left eval  Hip flexion      Hip extension      Hip abduction      Hip adduction      Hip internal rotation      Hip external rotation      Knee flexion      Knee extension      Ankle dorsiflexion 4- 4+  Ankle plantarflexion Can complete SL calf raise Can complete SL calf raise  Ankle inversion 4- 4+  Ankle eversion 4- 4+   (Blank rows = not tested) *pain with all MMT on the RLE   LOWER EXTREMITY SPECIAL TESTS:  Not tested    FUNCTIONAL TESTS:  SLS LLE 5 seconds; RLE 2 seconds (maintains foot in plantarflexion)    Sit to stand transfer- significant use of UE support, Modified independent (increased time)    GAIT: Distance walked: 20 ft Assistive device utilized: None Level of assistance: Complete Independence Comments: maintains both feet in plantarflexion during stance phase (will not put heel on the ground)        TODAY'S TREATMENT: [facility] Adult PT Treatment:                                                DATE: 06/21/22 Therapeutic Exercise: 4 way ankle green band 2 x 10  Ankle rockerboard A/P 2  x10 with overpressure from therapist to maintain foot flat  Great toe extension 2 x 10  Seated SL calf raise 2 x 10 on step; 10 lb kettlebell  Seated marble pickups 2 x 10  Updated HEP Manual Therapy: Desensitization Rt foot with sock  Passive gastroc and great toe flexor stretching STM plantar fascia and gastroc.     [facility] Adult PT Treatment:                                                DATE: 06/14/22 Therapeutic Exercise: Elliptical level 1 x 3 minutes  Calf stretch on wedge x 1 minute Wall squats x 10   Manual Therapy: Desensitization Rt foot with towel  Passive gastroc stretching  Heel cup tape job  Gait training: Forward step overs focusing on heel strike and foot flat stance Gait training working on step through pattern with heel strike.    [facility] Adult PT Treatment:                                                 DATE: 06/07/22 Therapeutic Exercise: Treadmill speed 0.6 x 5 minutes  DL standing focusing on neutral foot alignment x 3 minutes SL stance on the RLE with LLE on stability ball 4 x 30 sec  Resisted ankle DF green band 2 x 10  Leg press SL RLE 2 x 10 @ 20  lbs  Sit to stand x 10  Updated HEP  Manual Therapy: Desensitization Rt foot with towel and tennis ball  Passive gastroc stretching      PATIENT EDUCATION:  Education details: see above; TPDN Person educated: Patient Education method: Explanation, Demonstration, Tactile cues, Verbal cues, and Handouts Education comprehension: verbalized understanding, returned demonstration, verbal cues required, tactile cues required, and needs further education     HOME EXERCISE PROGRAM: Access Code: [email or web address] URL: [email or web address] Date: 05/15/2022 Prepared by: Letitia Libra   Exercises - Supine Ankle Pumps  - 3 x daily - 7 x weekly - 2 sets - 10 reps - Seated Heel Toe Raises  - 3 x daily - 7 x weekly - 3 sets - 10 reps - Long Sitting Calf Stretch with Strap  - 3 x daily - 7 x weekly - 3 sets - 30 sec hold - Towel Desensitization  - 3 x daily - 7 x weekly - 5 minutes hold   ASSESSMENT:   CLINICAL IMPRESSION: Patient arrives with complaints of acute Rt anterior hip pain that began about a week ago of insidious onset that is worsened with standing/walking activity. She reports the foot is hurting worse due to her hip pain. It was recommended that she f/u with PCP regarding this pain. Due to these new complaints, exercises were completed in supine and sitting today. She has difficulty with isolated inversion and eversion strengthening with visible shaking when she performs. She was able to tolerate soft tissue mobilization to the plantar fascia and gastroc today with notable tautness. Discussed potential benefits of TPDN, though patient declined this intervention. With seated activity she required consistent  verbal or tactile cues to achieve foot flat as she has tendency to maintain the Rt foot in plantarflexion/supination. HEP was updated to include further strengthening.    OBJECTIVE IMPAIRMENTS Abnormal gait, decreased activity tolerance, decreased balance, decreased knowledge of condition, decreased mobility, difficulty walking, decreased ROM, decreased strength, increased edema, increased fascial restrictions, impaired flexibility, impaired sensation, improper body mechanics, postural dysfunction, and pain.    ACTIVITY LIMITATIONS carrying, lifting, bending, standing, squatting, sleeping, stairs, transfers, and locomotion level   PARTICIPATION LIMITATIONS: meal prep, cleaning, laundry, shopping, community activity, occupation, and yard work   PERSONAL FACTORS Fitness, Time since onset of injury/illness/exacerbation, and 3+ comorbidities: see PMH above  are also affecting patient's functional outcome.    REHAB POTENTIAL: Fair has previously completed PT for same issue   CLINICAL DECISION MAKING: Evolving/moderate complexity   EVALUATION COMPLEXITY: Moderate     GOALS: Goals reviewed with patient? Yes   SHORT TERM GOALS: Target date: 06/04/2022     Patient will be able to achieve neutral foot flat alignment in standing and tolerate for at least 3 minutes to improve her ability to complete household tasks.  Baseline: unable Goal status: met   2.  Patient will be able to complete sit to stand transfer without use of UE to improve her ability to transfer out of bathtub.  Baseline: Mod I with significant use of UE support.  Goal status: met   3.  Patient will improve ankle dorsiflexion AROM by at least 10 degrees to improve gait mechanics.  Baseline: lacking 15 Goal status: met         LONG TERM GOALS: Target date: 06/25/2022       Patient will be able to achieve foot flat during stance phase of gait cycle.  Baseline: unable Goal status: INITIAL   2.  Patient will demonstrate at  least 4+/5 strength in the Rt ankle to improve stability with walking activity.  Baseline: see above Goal status: INITIAL   3.  Patient will improve Rt ankle inversion and eversion AROM by at least 5 degrees to improve gait mechanics.  Baseline: see above Goal status: INITIAL   4. Patient will score at least 35/80 on LEFS to signify clinically meaningful improvement in functional abilities.    Baseline: see above Goal status: INITIAL   5. Patient will maintain SLS on the RLE for at least 5 seconds to improve gait stability.             Baseline: see above            Goal Status: INITIAL      PLAN: PT FREQUENCY: 1x/week   PT DURATION: 6 weeks   PLANNED INTERVENTIONS: Therapeutic exercises, Therapeutic activity, Neuromuscular re-education, Balance training, Gait training, Patient/Family education, Self Care, Joint mobilization, Stair training, Aquatic Therapy, Dry Needling, Cryotherapy, Moist heat, Vasopneumatic device, Ultrasound, Ionotophoresis 4mg /ml Dexamethasone, Manual therapy, and Re-evaluation   PLAN FOR NEXT SESSION: desensitization, calf stretching, gentle ankle mobility/strengthening, gait training, balance   , PT, DPT, ATC 06/22/22 8:33 AM

## 2022-06-28 ENCOUNTER — Ambulatory Visit: Payer: Medicaid Other

## 2022-07-03 ENCOUNTER — Ambulatory Visit: Payer: Medicaid Other | Attending: Family Medicine | Admitting: Family Medicine

## 2022-07-03 ENCOUNTER — Encounter: Payer: Self-pay | Admitting: Family Medicine

## 2022-07-03 ENCOUNTER — Other Ambulatory Visit (HOSPITAL_COMMUNITY)
Admission: RE | Admit: 2022-07-03 | Discharge: 2022-07-03 | Disposition: A | Discharge status: Home / Self Care | Payer: Medicaid Other | Source: Ambulatory Visit | Attending: Family Medicine | Admitting: Family Medicine

## 2022-07-03 VITALS — BP 125/86 | HR 107 | Temp 98.0°F | Ht 64.0 in | Wt 257.0 lb

## 2022-07-03 DIAGNOSIS — Z124 Encounter for screening for malignant neoplasm of cervix: Secondary | ICD-10-CM | POA: Diagnosis present

## 2022-07-03 DIAGNOSIS — Z0000 Encounter for general adult medical examination without abnormal findings: Secondary | ICD-10-CM | POA: Diagnosis not present

## 2022-07-03 DIAGNOSIS — Z113 Encounter for screening for infections with a predominantly sexual mode of transmission: Secondary | ICD-10-CM | POA: Insufficient documentation

## 2022-07-03 DIAGNOSIS — Z1231 Encounter for screening mammogram for malignant neoplasm of breast: Secondary | ICD-10-CM | POA: Diagnosis not present

## 2022-07-03 NOTE — Patient Instructions (Signed)

## 2022-07-03 NOTE — Progress Notes (Signed)
Subjective:  Patient ID: Sarah Garrison, female    DOB: Jul 14, 1980  Age: 42 y.o. MRN: [id number]  CC: Gynecologic Exam   HPI Sarah Garrison is a 42 y.o. year old female with a history of type 2 diabetes mellitus (A1c 8.6 from 03/2022 -followed by endocrinology, [facility]), obesity, history of Covid in 09/2019, anxiety, GERD seen for an office visit.    Interval History:  Today she is due for her breast cancer and cervical cancer screen.  She denies a family history of colorectal cancer. Last eye exam was 2 years a go and next appointment comes up on 08/07/22. She sees a Pharmacist, community regularly[facility] on [facility].  Past Medical History:  Diagnosis Date   Anxiety    Asthma    Chronic constipation    Chronic shortness of breath    Depression    GERD (gastroesophageal reflux disease)    History of COVID-19 09/29/2019   09-28-2020 result in epic; hospital admission 10-05-2019 for covid pneumonia with hypoxic acute respiratory failure, no oxygen needed when discharged 10-06-2019;  and positive 06-13-2020 result in epic, pt stated no symptoms   History of seizures    (05-04-2021  pt stated was told during hospital admission 05/ 2021 with covid , she had 2 seizures,  stated no medication and no seizure since)   Hypertension    Insulin dependent type 2 diabetes mellitus [facility])    endocrinologist-- Jacolyn Reedy NP;   pt uses insulin pump   Insulin pump in place    Mixed hyperlipidemia    OSA (obstructive sleep apnea)    ( 05-04-2021 pt stated has not used cpap since 05/ 2021) followed by dr byrum;  study in epic 02-16-2019 moderate , titrated cpap 08-28-2019   Plantar fasciitis, right    w/ chronic pain    Past Surgical History:  Procedure Laterality Date   [place] Right 05/08/2021   Procedure: ENDOSCOPIC PLANTAR FASCIOTOMY;  Surgeon: Landis Martins, DPM;  Location: [place];  Service:  Podiatry;  Laterality: Right;  WITH BLOCK   STERIOD INJECTION Bilateral 05/08/2021   Procedure: PLATELET RICH PLASMA INJECTION HEELS;  Surgeon: Landis Martins, DPM;  Location: [place];  Service: Podiatry;  Laterality: Bilateral;   TUBAL LIGATION Bilateral 09/29/2006   @[facility] ;   [facility]   VENTRAL HERNIA REPAIR N/A 11/27/2017   Procedure: VENTRAL HERNIA REPAIR ERAS PATHWAY;  Surgeon: Erroll Luna, MD;  Location: [place];  Service: General;  Laterality: N/A;   WISDOM TOOTH EXTRACTION      Family History  Problem Relation Age of Onset   Asthma Mother    Kidney failure Mother    Brain cancer Mother    Asthma Father    Other Father        surgery on stomach but don't know from what   Diabetes Brother    Diabetes Maternal Grandmother     Social History   Socioeconomic History   Marital status: Single    Spouse name: Not on file   Number of children: 4   Years of education: Not on file   Highest education level: Not on file  Occupational History   Not on file  Tobacco Use   Smoking status: Former    Packs/day: 0.10    Years: 2.00    Total pack years: 0.20    Types: Cigarettes  Quit date: 11/27/2017    Years since quitting: 4.6   Smokeless tobacco: Never  Vaping Use   Vaping Use: Some days   Devices: Elfvar  (05-04-2021 per pt last vaped approx 2 wks ago)  Substance and Sexual Activity   Alcohol use: No   Drug use: Never   Sexual activity: Yes    Birth control/protection: Surgical, Injection  Other Topics Concern   Not on file  Social History Narrative   Right handed   No caffeine use   Diet coke sometimes    Social Determinants of Health   Financial Resource Strain: Not on file  Food Insecurity: Not on file  Transportation Needs: Not on file  Physical Activity: Not on file  Stress: Not on file  Social Connections: Not on file    Allergies  Allergen Reactions   Morphine And Related Shortness Of Breath   Glyburide Diarrhea  and Other (See Comments)    Reaction:  Nose bleeds    Hydrocodone Other (See Comments)    Pt states that this medication makes her "dream about rabbits chasing her"   Ivp Dye [Iodinated Contrast Media] Other (See Comments)    Shortness of breath.     Oxycodone Other (See Comments)    Pt states that this medication makes her "dream about rabbits chasing" her.      Outpatient Medications Prior to Visit  Medication Sig Dispense Refill   acetaminophen (TYLENOL) 500 MG tablet Take 1 tablet (500 mg total) by mouth every 6 (six) hours as needed. 30 tablet 0   ammonium lactate (AMLACTIN) 12 % lotion Apply 1 Application topically as needed for dry skin. 400 g 0   atorvastatin (LIPITOR) 40 MG tablet Take 1 tablet (40 mg total) by mouth daily. 90 tablet 1   benzonatate (TESSALON) 100 MG capsule Take 1 capsule (100 mg total) by mouth 3 (three) times daily as needed for cough. 30 capsule 0   Continuous Blood Gluc Sensor (DEXCOM G6 SENSOR) MISC USE AND CHANGE SENSOR EVERY 10 DAYS AS DIRECTED     Continuous Blood Gluc Sensor (DEXCOM G6 SENSOR) MISC SMARTSIG:Topical Every 10 Days     Continuous Blood Gluc Transmit (DEXCOM G6 TRANSMITTER) MISC USE TO CHECK BLOOD SUGAR EVERY DAY. CHANGE EVERY 3 MONTHS AS DIRECTED     cyclobenzaprine (FLEXERIL) 10 MG tablet Take 1 tablet (10 mg total) by mouth at bedtime. 30 tablet 0   diclofenac (FLECTOR) 1.3 % PTCH Place 1 patch onto the skin 2 (two) times daily. 60 patch 1   famotidine (PEPCID) 20 MG tablet Take 1 tablet (20 mg total) by mouth at bedtime. 30 tablet 6   fluticasone (FLONASE) 50 MCG/ACT nasal spray Place 2 sprays into both nostrils daily. 16 g 1   hydrocortisone 2.5 % lotion Apply topically 2 (two) times daily. 59 mL 0   hydrOXYzine (ATARAX) 25 MG tablet Take 1 tablet (25 mg total) by mouth every 8 (eight) hours as needed for itching. 20 tablet 0   ibuprofen (ADVIL) 800 MG tablet Take 1 tablet (800 mg total) by mouth every 8 (eight) hours as needed. 30 tablet  0   insulin aspart (NOVOLOG FLEXPEN) 100 UNIT/ML FlexPen Inject into the skin 3 (three) times daily before meals. ONLY WHEN INSULIN PUMP MALFUNCTION'S PER ENDOCRINOLOGY DIRECTION     insulin aspart (NOVOLOG) 100 UNIT/ML injection USE VIA INSULIN PUMP. MAX TOTAL DAILY DOSE OF 200 UNITS 90 mL 5   insulin aspart (NOVOLOG) 100 UNIT/ML injection USE VIA INSULIN  PUMP. MAX TOTAL DAILY DOSE OF 200 UNITS 90 mL 5   insulin glargine, 1 Unit Dial, (TOUJEO SOLOSTAR) 300 UNIT/ML Solostar Pen Administer 50 units once daily only if insulin pump malfunction 9 mL 1   Insulin Human (INSULIN PUMP) SOLN Inject 1 each into the skin See admin instructions. Medication: Novolog 100 units/ml injection. Takes 100 units via pump per patient.     Insulin Syringe-Needle U-100 31G X 5/16" 1 ML MISC inject 10 units into the skin 3 times daily. use to inject novolog based on insulin to card ratio- max daily dose 150 units 100 each 2   levocetirizine (XYZAL ALLERGY 24HR) 5 MG tablet Take 1 tablet (5 mg total) by mouth every evening. 90 tablet 0   linaclotide (LINZESS) 72 MCG capsule Take 1 capsule (72 mcg total) by mouth daily before breakfast. 30 capsule 3   lisinopril (ZESTRIL) 20 MG tablet Take 1 tablet (20 mg total) by mouth once daily. 90 tablet 2   lisinopril (ZESTRIL) 20 MG tablet Take 1 tablet (20 mg total) by mouth daily. 90 tablet 2   meclizine (ANTIVERT) 12.5 MG tablet Take 1 tablet (12.5 mg total) by mouth 2 (two) times daily as needed (Motion sickness). 30 tablet 0   meloxicam (MOBIC) 7.5 MG tablet Take 1 tablet (7.5 mg total) by mouth 2 (two) times daily with a meal. 15 tablet 0   methylPREDNISolone (MEDROL DOSEPAK) 4 MG TBPK tablet Take as directed 21 each 0   Misc. Devices MISC CPAP therapy on autopap 5-15.  Needs Small size Fisher[employer]Paykel Full Face Mask Simplus  mask and heated humidification. 1 each 0   olopatadine (PATANOL) 0.1 % ophthalmic solution Place 1 drop into both eyes 2 (two) times daily. 5 mL 1    ondansetron (ZOFRAN ODT) 8 MG disintegrating tablet Take 1 tablet (8 mg total) by mouth every 8 (eight) hours as needed for nausea or vomiting. 20 tablet 0   pantoprazole (PROTONIX) 40 MG tablet TAKE 1 TABLET (40 MG TOTAL) BY MOUTH DAILY. 90 tablet 1   predniSONE (DELTASONE) 20 MG tablet Take 2 tablets daily with breakfast. 10 tablet 0   pregabalin (LYRICA) 75 MG capsule Take 1 capsule (75 mg total) by mouth 2 (two) times daily. 90 capsule 1   promethazine-dextromethorphan (PROMETHAZINE-DM) 6.25-15 MG/5ML syrup Take 2.5 mLs by mouth 3 (three) times daily as needed for cough. 100 mL 0   sertraline (ZOLOFT) 100 MG tablet Take 1 tablet (100 mg total) by mouth daily. 90 tablet 1   triamcinolone cream (KENALOG) 0.1 % Apply 1 Application topically 2 (two) times daily. 30 g 0   Vitamin D, Ergocalciferol, (DRISDOL) 1.25 MG (50000 UNIT) CAPS capsule Take 1 capsule (50,000 Units total) by mouth every 7 (seven) days. 12 capsule 0   No facility-administered medications prior to visit.     ROS Review of Systems  Constitutional:  Negative for activity change and appetite change.  HENT:  Negative for sinus pressure and sore throat.   Respiratory:  Negative for chest tightness, shortness of breath and wheezing.   Cardiovascular:  Negative for chest pain and palpitations.  Gastrointestinal:  Negative for abdominal distention, abdominal pain and constipation.  Genitourinary: Negative.   Musculoskeletal: Negative.   Psychiatric/Behavioral:  Negative for behavioral problems and dysphoric mood.     Objective:  BP 125/86   Pulse (!) 107   Temp 98 F (36.7 C) (Oral)   Ht 5\' 4"  (1.626 m)   Wt 257 lb (116.6 kg)  SpO2 100%   BMI 44.11 kg/m      07/03/2022    2:30 PM 06/08/2022    8:32 AM 05/31/2022    4:29 PM  BP/Weight  Systolic BP 125 126 126  Diastolic BP 86 84 90  Wt. (Lbs) 257  255.2  BMI 44.11 kg/m2  43.8 kg/m2      Physical Exam Exam conducted with a chaperone present.  Constitutional:       General: She is not in acute distress.    Appearance: She is well-developed. She is not diaphoretic.  HENT:     Head: Normocephalic.     Right Ear: External ear normal.     Left Ear: External ear normal.     Nose: Nose normal.  Eyes:     Conjunctiva/sclera: Conjunctivae normal.     Pupils: Pupils are equal, round, and reactive to light.  Neck:     Vascular: No JVD.  Cardiovascular:     Rate and Rhythm: Normal rate and regular rhythm.     Heart sounds: Normal heart sounds. No murmur heard.    No gallop.  Pulmonary:     Effort: Pulmonary effort is normal. No respiratory distress.     Breath sounds: Normal breath sounds. No wheezing or rales.  Chest:     Chest wall: No tenderness.  Breasts:    Right: Normal. No mass, nipple discharge or tenderness.     Left: Normal. No mass, nipple discharge or tenderness.  Abdominal:     General: Bowel sounds are normal. There is no distension.     Palpations: Abdomen is soft. There is no mass.     Tenderness: There is no abdominal tenderness.     Hernia: There is no hernia in the left inguinal area or right inguinal area.  Genitourinary:    General: Normal vulva.     Pubic Area: No rash.      Labia:        Right: No rash.        Left: No rash.      Vagina: Normal.     Cervix: Normal.     Uterus: Normal.      Adnexa: Right adnexa normal and left adnexa normal.       Right: No tenderness.         Left: No tenderness.    Musculoskeletal:        General: No tenderness. Normal range of motion.     Cervical back: Normal range of motion. No tenderness.     Right lower leg: No edema.     Left lower leg: No edema.  Lymphadenopathy:     Upper Body:     Right upper body: No supraclavicular or axillary adenopathy.     Left upper body: No supraclavicular or axillary adenopathy.  Skin:    General: Skin is warm and dry.  Neurological:     Mental Status: She is alert and oriented to person, place, and time.     Deep Tendon Reflexes:  Reflexes are normal and symmetric.  Psychiatric:        Mood and Affect: Mood normal.    Diabetic Foot Exam - Simple   Simple Foot Form Diabetic Foot exam was performed with the following findings: Yes 07/03/2022  2:50 PM  Visual Inspection No deformities, no ulcerations, no other skin breakdown bilaterally: Yes Sensation Testing Intact to touch and monofilament testing bilaterally: Yes Pulse Check Posterior Tibialis and Dorsalis pulse intact bilaterally: Yes Comments  Latest Ref Rng & Units 05/31/2022    4:56 PM 05/27/2021    6:11 PM 05/08/2021   12:48 PM  CMP  Glucose 70 - 99 mg/dL 975  883  254   BUN 6 - 24 mg/dL 16  9  13    Creatinine 0.57 - 1.00 mg/dL  9.82  6.41   Sodium 134 - 144 mmol/L 141  137  141   Potassium 3.5 - 5.2 mmol/L 4.1  3.9  3.7   Chloride 96 - 106 mmol/L 106  108  106   CO2 20 - 29 mmol/L 23  21    Calcium 8.7 - 10.2 mg/dL 9.7  8.8    Total Protein 6.0 - 8.5 g/dL 6.9     Total Bilirubin 0.0 - 1.2 mg/dL 0.2     Alkaline Phos 44 - 121 IU/L 112     AST 0 - 40 IU/L 15     ALT 0 - 32 IU/L 23       Lipid Panel     Component Value Date/Time   CHOL 244 (H) 10/26/2020 0908   TRIG 85 10/26/2020 0908   HDL 50 10/26/2020 0908   CHOLHDL 4.9 (H) 10/26/2020 0908   CHOLHDL 6 03/03/2018 1419   VLDL 37.4 03/03/2018 1419   LDLCALC 179 (H) 10/26/2020 0908    CBC    Component Value Date/Time   WBC 7.2 05/27/2021 1811   RBC 4.64 05/27/2021 1811   HGB 13.7 05/27/2021 1811   HGB 14.5 10/26/2020 0908   HCT 41.0 05/27/2021 1811   HCT 44.5 10/26/2020 0908   PLT 320 05/27/2021 1811   PLT 339 10/26/2020 0908   MCV 88.4 05/27/2021 1811   MCV 90 10/26/2020 0908   MCH 29.5 05/27/2021 1811   MCHC 33.4 05/27/2021 1811   RDW 12.1 05/27/2021 1811   RDW 12.4 10/26/2020 0908   LYMPHSABS 3.3 05/27/2021 1811   LYMPHSABS 3.1 10/26/2020 0908   MONOABS 0.5 05/27/2021 1811   EOSABS 0.1 05/27/2021 1811   EOSABS 0.0 10/26/2020 0908   BASOSABS 0.0 05/27/2021  1811   BASOSABS 0.0 10/26/2020 0908    Lab Results  Component Value Date   HGBA1C 12.2 (H) 10/07/2019    Assessment & Plan:  1. Annual physical exam Counseled on 150 minutes of exercise per week, healthy eating (including decreased daily intake of saturated fats, cholesterol, added sugars, sodium), STI prevention, routine healthcare maintenance.   2. Encounter for screening mammogram for malignant neoplasm of breast - MM 3D SCREEN BREAST BILATERAL; Future  3. Screening for cervical cancer - Cytology - PAP  4. Screening for STD (sexually transmitted disease) - Cervicovaginal ancillary only    No orders of the defined types were placed in this encounter.   Follow-up: Return in about 6 months (around 01/01/2023) for Chronic medical conditions.       01/03/2023, MD, FAAFP. [facility] [place], [place]   07/03/2022, 2:55 PM

## 2022-07-04 ENCOUNTER — Other Ambulatory Visit: Payer: Self-pay | Admitting: Family Medicine

## 2022-07-04 LAB — CERVICOVAGINAL ANCILLARY ONLY
Bacterial Vaginitis (gardnerella): POSITIVE — AB
Candida Glabrata: NEGATIVE
Candida Vaginitis: NEGATIVE
Chlamydia: NEGATIVE
Comment: NEGATIVE
Comment: NEGATIVE
Comment: NEGATIVE
Comment: NEGATIVE
Comment: NEGATIVE
Comment: NORMAL
Neisseria Gonorrhea: NEGATIVE
Trichomonas: NEGATIVE

## 2022-07-04 MED ORDER — METRONIDAZOLE 500 MG PO TABS: 500.0000 mg | ORAL_TABLET | Freq: Two times a day (BID) | ORAL | 0 refills | Status: AC

## 2022-07-05 ENCOUNTER — Ambulatory Visit: Payer: Medicaid Other | Attending: Podiatry

## 2022-07-05 DIAGNOSIS — M79671 Pain in right foot: Secondary | ICD-10-CM | POA: Diagnosis present

## 2022-07-05 DIAGNOSIS — M6281 Muscle weakness (generalized): Secondary | ICD-10-CM | POA: Insufficient documentation

## 2022-07-05 DIAGNOSIS — R600 Localized edema: Secondary | ICD-10-CM | POA: Insufficient documentation

## 2022-07-05 DIAGNOSIS — R2689 Other abnormalities of gait and mobility: Secondary | ICD-10-CM | POA: Diagnosis present

## 2022-07-05 NOTE — Therapy (Signed)
OUTPATIENT PHYSICAL THERAPY TREATMENT NOTE PHYSICAL THERAPY DISCHARGE SUMMARY  Visits from Start of Care: 6  Current functional level related to goals / functional outcomes: See goals below   Remaining deficits: Pain and hypersensitivity to light touch of the Rt calcaneous  Gait abnormality Rt ankle weakness    Education / Equipment: See education below   Patient agrees to discharge. Patient goals were partially met. Patient is being discharged due to did not respond to therapy.    Patient Name: Sarah Garrison MRN: [id number] DOB:1980/06/15, 42 y.o., female Today's Date: 07/06/2022  PCP: Charlott Rakes, MD  REFERRING PROVIDER: Felipa Furnace, DPM  END OF SESSION:   PT End of Session - 07/05/22 1618     Visit Number 6    Number of Visits 7    Date for PT Re-Evaluation 06/30/22    Authorization Type MCD- [employer] blue    Authorization Time Period 12/13-2/10/24    Authorization - Visit Number 5    Authorization - Number of Visits 6    PT Start Time 0086    PT Stop Time 1655    PT Time Calculation (min) 40 min    Activity Tolerance Patient tolerated treatment well    Behavior During Therapy [facility] for tasks assessed/performed                 Past Medical History:  Diagnosis Date   Anxiety    Asthma    Chronic constipation    Chronic shortness of breath    Depression    GERD (gastroesophageal reflux disease)    History of COVID-19 09/29/2019   09-28-2020 result in epic; hospital admission 10-05-2019 for covid pneumonia with hypoxic acute respiratory failure, no oxygen needed when discharged 10-06-2019;  and positive 06-13-2020 result in epic, pt stated no symptoms   History of seizures    (05-04-2021  pt stated was told during hospital admission 05/ 2021 with covid , she had 2 seizures,  stated no medication and no seizure since)   Hypertension    Insulin dependent type 2 diabetes mellitus ([place])    endocrinologist-- Jacolyn Reedy NP;   pt uses insulin pump    Insulin pump in place    Mixed hyperlipidemia    OSA (obstructive sleep apnea)    ( 05-04-2021 pt stated has not used cpap since 05/ 2021) followed by dr byrum;  study in epic 02-16-2019 moderate , titrated cpap 08-28-2019   Plantar fasciitis, right    w/ chronic pain   Past Surgical History:  Procedure Laterality Date   [place] Right 05/08/2021   Procedure: ENDOSCOPIC PLANTAR FASCIOTOMY;  Surgeon: Landis Martins, DPM;  Location: [place];  Service: Podiatry;  Laterality: Right;  WITH BLOCK   STERIOD INJECTION Bilateral 05/08/2021   Procedure: PLATELET RICH PLASMA INJECTION HEELS;  Surgeon: Landis Martins, DPM;  Location: [place];  Service: Podiatry;  Laterality: Bilateral;   TUBAL LIGATION Bilateral 09/29/2006   @[facility] ;   [facility]   VENTRAL HERNIA REPAIR N/A 11/27/2017   Procedure: VENTRAL HERNIA REPAIR ERAS PATHWAY;  Surgeon: Erroll Luna, MD;  Location: [place];  Service: General;  Laterality: N/A;   [place] EXTRACTION     Patient Active Problem List   Diagnosis Date Noted   Palpitations 07/31/2021   Fatigue 07/31/2021   Primary hypertension 07/31/2021   Hip pain 12/08/2019   Hyperlipidemia associated with type 2 diabetes mellitus ([place]) 11/23/2019  History of COVID-19 10/27/2019   Stuttering 10/27/2019   Depression with anxiety 10/27/2019   Obesity, Class III, BMI 40-49.9 (morbid obesity) (HCC) 06/30/2019   OSA (obstructive sleep apnea) 02/16/2019   Essential hypertension 06/22/2018   Type 2 diabetes mellitus with hyperosmolarity without coma, with long-term current use of insulin (HCC) 12/13/2016   Asthma 07/30/2016   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/07/2016   DM neuropathy, type II diabetes mellitus (HCC) 08/24/2015    REFERRING DIAG: M72.2 (ICD-10-CM) - Plantar fasciitis, right   THERAPY DIAG:  Pain in right foot  Muscle weakness (generalized)  Other abnormalities of gait and  mobility  Localized edema  Rationale for Evaluation and Treatment Rehabilitation  PERTINENT HISTORY: Right ENDOSCOPIC PLANTAR FASCIOTOMY 05/08/2021 History of seizures  Diabetes   PRECAUTIONS: none   SUBJECTIVE:                                                                                                                                                                                      SUBJECTIVE STATEMENT:  Patient reports her pain is still the same on the bottom of her foot. She continues to walk without putting her heel down and when she does it increases her pain. Patient reports that she is now experiencing Rt hip and knee pain as well without known cause and notices when she rubs her knee she feels pain in her foot.    PAIN:  Are you having pain? Yes: NPRS scale: 6/10 Pain location: Rt heel and Rt hip Pain description: sharp;poking;pressure;ache Aggravating factors: walking,standing Relieving factors: rest   OBJECTIVE: (objective measures completed at initial evaluation unless otherwise dated)  DIAGNOSTIC FINDINGS:  MRI Right Foot IMPRESSION: 1. Findings consistent with moderate plantar fasciitis. No evidence of fascial rupture or significant surrounding soft tissue or marrow edema. 2. Mild midfoot degenerative changes without acute osseous findings. 3. The ankle tendons and ligaments appear unremarkable.   PATIENT SURVEYS:  LEFS: 17/80 LEFS: 29/80    COGNITION:           Overall cognitive status: Within functional limits for tasks assessed                          SENSATION: Not tested    EDEMA:  Figure 8 RLE 55 cm LLE 54 cm    MUSCLE LENGTH: Not tested   POSTURE:  In standing: maintains Rt knee in flexion and Rt foot in plantarflexion (will not bear weight through her heel) ;when instructed to place heel on the ground she supinates and will not place medial aspect of the calcaneous on the ground.    PALPATION: Extreme hypersensitivity to light  touch  about Rt medial calcaneous and achilles tendon Normal capillary refill      LOWER EXTREMITY ROM:   Active ROM Right eval Left eval 05/22/22 Right 06/14/22 Right  07/05/22 Right   Hip flexion         Hip extension         Hip abduction         Hip adduction         Hip internal rotation         Hip external rotation         Knee flexion         Knee extension         Ankle dorsiflexion Lacking 15  3 Lacking 9  Lacking 1  0  Ankle plantarflexion WNL WNL     Ankle inversion 10 15   18   Ankle eversion 2 13   15    (Blank rows = not tested) *pain with all AROM on the RLE   LOWER EXTREMITY MMT:   MMT Right eval Left eval 07/05/22  Hip flexion       Hip extension       Hip abduction       Hip adduction       Hip internal rotation       Hip external rotation       Knee flexion       Knee extension       Ankle dorsiflexion 4- 4+ Rt: 4-; Lt: 5  Ankle plantarflexion Can complete SL calf raise Can complete SL calf raise 5  Ankle inversion 4- 4+ 5/5 bilateral  Ankle eversion 4- 4+ Rt: 4; Lt: 5   (Blank rows = not tested) *pain with all MMT on the RLE   LOWER EXTREMITY SPECIAL TESTS:  Not tested    FUNCTIONAL TESTS:  SLS LLE 5 seconds; RLE 2 seconds (maintains foot in plantarflexion)    Sit to stand transfer- significant use of UE support, Modified independent (increased time)   07/05/22: SLS 6 seconds bilateral, maintains Rt foot in plantarflexion   GAIT: Distance walked: 20 ft Assistive device utilized: None Level of assistance: Complete Independence Comments: maintains both feet in plantarflexion during stance phase (will not put heel on the ground)        TODAY'S TREATMENT: [facility] Adult PT Treatment:                                                DATE: 07/05/22 Therapeutic Exercise: Calf stretch with strap 2 x 30 sec Standing weight shifts x 10  Resisted ankle inversion and eversion red band x 10 each Great toe extension x 10  Sit to stand x 2; d/c due to  pain Ankle DF AROM in long sit x 10  Reviewed and updated HEP educating patient on sets, reps, frequency.   Therapeutic Activity: Re-assessment to determine overall progress, educating patient on progress towards goals.     [facility] Adult PT Treatment:                                                DATE: 06/21/22 Therapeutic Exercise: 4 way ankle green band 2 x 10  Ankle rockerboard A/P 2  x10  with overpressure from therapist to maintain foot flat  Great toe extension 2 x 10  Seated SL calf raise 2 x 10 on step; 10 lb kettlebell  Seated marble pickups 2 x 10  Updated HEP Manual Therapy: Desensitization Rt foot with sock  Passive gastroc and great toe flexor stretching STM plantar fascia and gastroc.     [facility] Adult PT Treatment:                                                DATE: 06/14/22 Therapeutic Exercise: Elliptical level 1 x 3 minutes  Calf stretch on wedge x 1 minute Wall squats x 10   Manual Therapy: Desensitization Rt foot with towel  Passive gastroc stretching  Heel cup tape job  Gait training: Forward step overs focusing on heel strike and foot flat stance Gait training working on step through pattern with heel strike.    [facility] Adult PT Treatment:                                                DATE: 06/07/22 Therapeutic Exercise: Treadmill speed 0.6 x 5 minutes  DL standing focusing on neutral foot alignment x 3 minutes SL stance on the RLE with LLE on stability ball 4 x 30 sec  Resisted ankle DF green band 2 x 10  Leg press SL RLE 2 x 10 @ 20 lbs  Sit to stand x 10  Updated HEP  Manual Therapy: Desensitization Rt foot with towel and tennis ball  Passive gastroc stretching      PATIENT EDUCATION:  Education details: see above; D/C education Person educated: Patient Education method: Explanation, Demonstration, Tactile cues, Verbal cues, and Handouts Education comprehension: verbalized understanding, returned demonstration, verbal cues required, tactile  cues required     HOME EXERCISE PROGRAM: Access Code: [email or web address]  ASSESSMENT:   CLINICAL IMPRESSION: Verneice has attended 6 PT sessions reporting worsening of her Rt calcaneal pain as well as acute onset of Rt hip and knee pain over the past few weeks of insidious onset. She continues to have significant hypersensitivity to the Rt calcaneous and inability to stand or ambulate with her foot flat due to her pain. While she demonstrates slight improvement in ankle mobility and strength there has been no improvement in her pain. Due to worsening of her pain and inability to tolerate PT progression, she is appropriate for discharge with patient in agreement with this plan. She has an appointment scheduled with physical medicine and rehabilitation at the end of the month that will potentially help with her severe pain.   OBJECTIVE IMPAIRMENTS Abnormal gait, decreased activity tolerance, decreased balance, decreased knowledge of condition, decreased mobility, difficulty walking, decreased ROM, decreased strength, increased edema, increased fascial restrictions, impaired flexibility, impaired sensation, improper body mechanics, postural dysfunction, and pain.    ACTIVITY LIMITATIONS carrying, lifting, bending, standing, squatting, sleeping, stairs, transfers, and locomotion level   PARTICIPATION LIMITATIONS: meal prep, cleaning, laundry, shopping, community activity, occupation, and yard work   PERSONAL FACTORS Fitness, Time since onset of injury/illness/exacerbation, and 3+ comorbidities: see PMH above  are also affecting patient's functional outcome.    REHAB POTENTIAL: Fair has previously completed PT for same issue   CLINICAL DECISION MAKING: Evolving/moderate  complexity   EVALUATION COMPLEXITY: Moderate     GOALS: Goals reviewed with patient? Yes   SHORT TERM GOALS: Target date: 06/04/2022     Patient will be able to achieve neutral foot flat alignment in standing and tolerate for at least 3  minutes to improve her ability to complete household tasks.  Baseline: unable Goal status: not met   2.  Patient will be able to complete sit to stand transfer without use of UE to improve her ability to transfer out of bathtub.  Baseline: Mod I with significant use of UE support.  Goal status: met   3.  Patient will improve ankle dorsiflexion AROM by at least 10 degrees to improve gait mechanics.  Baseline: lacking 15 Goal status: met         LONG TERM GOALS: Target date: 06/25/2022       Patient will be able to achieve foot flat during stance phase of gait cycle.  Baseline: unable Goal status: not met    2.  Patient will demonstrate at least 4+/5 strength in the Rt ankle to improve stability with walking activity.  Baseline: see above Goal status: partially met    3.  Patient will improve Rt ankle inversion and eversion AROM by at least 5 degrees to improve gait mechanics.  Baseline: see above Goal status: met   4. Patient will score at least 35/80 on LEFS to signify clinically meaningful improvement in functional abilities.    Baseline: see above Goal status: not met   5. Patient will maintain SLS on the RLE for at least 5 seconds to improve gait stability.             Baseline: see above            Goal Status: met      PLAN: PT FREQUENCY:  n/a   PT DURATION: n/a   PLANNED INTERVENTIONS: Therapeutic exercises, Therapeutic activity, Neuromuscular re-education, Balance training, Gait training, Patient/Family education, Self Care, Joint mobilization, Stair training, Aquatic Therapy, Dry Needling, Cryotherapy, Moist heat, Vasopneumatic device, Ultrasound, Ionotophoresis 4mg /ml Dexamethasone, Manual therapy, and Re-evaluation   PLAN FOR NEXT SESSION: n/a  Gwendolyn Grant, PT, DPT, ATC 07/06/22 1:06 PM

## 2022-07-10 LAB — CYTOLOGY - PAP
Comment: NEGATIVE
Diagnosis: NEGATIVE
High risk HPV: NEGATIVE

## 2022-07-18 ENCOUNTER — Other Ambulatory Visit: Payer: Self-pay

## 2022-07-24 NOTE — Progress Notes (Unsigned)
Subjective:    Patient ID: Sarah Garrison, female    DOB: [birth year], 42 y.o.   MRN: [id number]  HPI  Pain Inventory Average Pain 7 Pain Right Now 7 My pain is sharp, burning, stabbing, tingling, and aching  In the last 24 hours, has pain interfered with the following? General activity 0 Relation with others 0 Enjoyment of life 2 What TIME of day is your pain at its worst? morning , daytime, evening, and night Sleep (in general) Fair  Pain is worse with: walking, sitting, standing, and some activites Pain improves with: heat/ice and therapy/exercise Relief from Meds: 3  walk without assistance do you drive?  yes  employed # of hrs/week 30 I need assistance with the following:  household duties  trouble walking  Any changes since last visit?  no  Any changes since last visit?  no  HPI  Sarah Garrison is a 42 y.o. year old female  who  has a past medical history of Anxiety, Asthma, Chronic constipation, Chronic shortness of breath, Depression, GERD (gastroesophageal reflux disease), History of COVID-19 (09/29/2019), History of seizures, Hypertension, Insulin dependent type 2 diabetes mellitus ([place]), Insulin pump in place, Mixed hyperlipidemia, OSA (obstructive sleep apnea), and Plantar fasciitis, right.   They are presenting to PM&R clinic as a new patient for pain management evaluation. They were referred by Dr. Martie Round for treatment of R foot pain.   Per his last note: Right Planter fasciitis recurrence -Progressing as expected post-operatively. -Manageable however she is experiencing more nervelike pain at this time.   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 -Ultimately she will benefit from nerve conduction study to evaluate the nerve down the right lower extremity. -Given that she is having positive Tinel's sign to the tarsal  tunnel as well as common peroneal it may likely be due to lower back -She will also benefit from pain management as well.  Source: R medial foot pain Inciting incident: Had surgery December 5th 2022 for plantar fasciitis; got worse after the surgery.  Description of pain: intermittent, sharp, stabbing, and tingling on medial and plantar foot and dorsal foot; because she can'tbear weight on her right foot she is having left knee pain.  Exacerbating factors:  when bearing weight on her right foot, standing, activity, and cold; walking barefoot or wearing shoes Remitting factors: nothing; propping up her foot with a pillow at nighttime; wearing socks without shoes Red flag symptoms: No red flags for back pain endorsed in Hx or ROS and Hx Osteoporosis  Medications tried: Topical medications (mild effect) : lidocaine cream, icyhot, biofreeze; stopped working almost immediately Nsaids (mild effect) : Using ibuprofen for work hours; 600 mg three times a day - eases pain a bit but doesn't last Took naproxen, meloxicam, and celecoxib in the past but feels ibuprofen works better.  Tylenol  (no effect) :  Opiates  (no effect) : Prior on tramadol 50 mg; caused fatigue and constipation Gabapentin / Lyrica  (no effect) : Prior on Lyrica 75 mg #90 tabs last refilled 08/04/21 - didn't work. Was prior on gabapentin twice a day, unsure of dose, did not work.  TCAs  (never tried) :  SNRIs  (never tried) :  Other  () : EMG 06/08/22? Wasn't able to accommodate her work schedule. Results of prior 2022 mild bilateral CTS. Had issues with her last study with debilitaqting shock like  pains to her back for several days.   Other treatments: PT/OT  (no effect) : Discharged [id number]:  Sarah Garrison has attended 6 PT sessions reporting worsening of her Rt calcaneal pain as well as acute onset of Rt hip and knee pain over the past few weeks of insidious onset. She continues to have significant hypersensitivity to the Rt calcaneous and  inability to stand or ambulate with her foot flat due to her pain. While she demonstrates slight improvement in ankle mobility and strength there has been no improvement in her pain. Due to worsening of her pain and inability to tolerate PT progression, she is appropriate for discharge with patient in agreement with this plan. She has an appointment scheduled with physical medicine and rehabilitation at the end of the month that will potentially help with her severe pain.   Accupuncture/chiropractor/massage  ({Blank single:19197::"never tried","no effect","mild effect","moderate effect","excellent effect","unsure of effect","***"}) : *** TENs unit ({Blank single:19197::"never tried","no effect","mild effect","moderate effect","excellent effect","unsure of effect","***"}) : *** Injections (never tried) : not in the foot Surgery ( pain was immediately worse ) : Dr. Cannon Kettle did initial plantar fascia surgery; Dr. Posey Pronto wanted to extend the surgery into her leg because fascia grew back and patient refused.  Other  (never tried) : Steroid injections and prednisone dosepaks "sent my sugar sky high"  Goals for pain control: ***  Prior UDS results: No results found for: "LABOPIA", "COCAINSCRNUR", "LABBENZ", "AMPHETMU", "THCU", "LABBARB"     Family History  Problem Relation Age of Onset   Asthma Mother    Kidney failure Mother    Brain cancer Mother    Asthma Father    Other Father        surgery on stomach but don't know from what   Diabetes Brother    Diabetes Maternal Grandmother    Social History   Socioeconomic History   Marital status: Single    Spouse name: Not on file   Number of children: 4   Years of education: Not on file   Highest education level: Not on file  Occupational History   Not on file  Tobacco Use   Smoking status: Former    Packs/day: 0.10    Years: 2.00    Total pack years: 0.20    Types: Cigarettes    Quit date: 11/27/2017    Years since quitting: 4.6    Smokeless tobacco: Never  Vaping Use   Vaping Use: Some days   Devices: Elfvar  (05-04-2021 per pt last vaped approx 2 wks ago)  Substance and Sexual Activity   Alcohol use: No   Drug use: Never   Sexual activity: Yes    Birth control/protection: Surgical, Injection  Other Topics Concern   Not on file  Social History Narrative   Right handed   No caffeine use   Diet coke sometimes    Social Determinants of Health   Financial Resource Strain: Not on file  Food Insecurity: Not on file  Transportation Needs: Not on file  Physical Activity: Not on file  Stress: Not on file  Social Connections: Not on file   Past Surgical History:  Procedure Laterality Date   [place] Right 05/08/2021   Procedure: ENDOSCOPIC PLANTAR FASCIOTOMY;  Surgeon: Landis Martins, DPM;  Location: [facility];  Service: Podiatry;  Laterality: Right;  WITH BLOCK   STERIOD INJECTION Bilateral 05/08/2021   Procedure: PLATELET RICH PLASMA INJECTION HEELS;  Surgeon: Landis Martins, DPM;  Location:  [facility];  Service: Podiatry;  Laterality: Bilateral;   TUBAL LIGATION Bilateral 09/29/2006   @[facility] ;   [facility]   VENTRAL HERNIA REPAIR N/A 11/27/2017   Procedure: VENTRAL HERNIA REPAIR ERAS PATHWAY;  Surgeon: Erroll Luna, MD;  Location: [facility];  Service: General;  Laterality: N/A;   WISDOM TOOTH EXTRACTION     Past Medical History:  Diagnosis Date   Anxiety    Asthma    Chronic constipation    Chronic shortness of breath    Depression    GERD (gastroesophageal reflux disease)    History of COVID-19 09/29/2019   09-28-2020 result in epic; hospital admission 10-05-2019 for covid pneumonia with hypoxic acute respiratory failure, no oxygen needed when discharged 10-06-2019;  and positive 06-13-2020 result in epic, pt stated no symptoms   History of seizures    (05-04-2021  pt stated was told during hospital admission 05/ 2021  with covid , she had 2 seizures,  stated no medication and no seizure since)   Hypertension    Insulin dependent type 2 diabetes mellitus ([place])    endocrinologist-- Jacolyn Reedy NP;   pt uses insulin pump   Insulin pump in place    Mixed hyperlipidemia    OSA (obstructive sleep apnea)    ( 05-04-2021 pt stated has not used cpap since 05/ 2021) followed by dr byrum;  study in epic 02-16-2019 moderate , titrated cpap 08-28-2019   Plantar fasciitis, right    w/ chronic pain   There were no vitals taken for this visit.  Opioid Risk Score:   Fall Risk Score:  `1  Depression screen [facility] 2/9     05/31/2022    4:32 PM 10/24/2020   10:40 AM 08/11/2020   10:05 AM 12/08/2019    9:44 AM 11/23/2019   10:41 AM 02/03/2019   10:26 AM 03/04/2018    8:55 AM  Depression screen PHQ 2/9  Decreased Interest 1 2 0 3 3 2 3  $ Down, Depressed, Hopeless 1 2 0 3 3 0 3  PHQ - 2 Score 2 4 0 6 6 2 6  $ Altered sleeping 1 3 1 3 3 3 3  $ Tired, decreased energy 1 3 1 3 3 3 3  $ Change in appetite 0 3 0 3 1 3 3  $ Feeling bad or failure about yourself  0 0 0 0 0 0 1  Trouble concentrating 0 1 1 3 $ 0 0 2  Moving slowly or fidgety/restless 0 1 0 3 3 0   Suicidal thoughts 0 0 0 0 0 0 0  PHQ-9 Score 4 15 3 21 16 11 18    $ Review of Systems  All other systems reviewed and are negative.     Objective:   Physical Exam   PE: Constitution: Appropriate appearance for age. No apparent distress *** +Obese Resp: No respiratory distress. No accessory muscle usage. {Blank multiple:19196::"***","on RA","CTAB","on *** L [place]"} Cardio: Well perfused appearance. *** No peripheral edema. Abdomen: Nondistended. Nontender.   Psych: Appropriate mood and affect. Neuro: AAOx4. No apparent cognitive deficits   Neurologic Exam:   DTRs: Reflexes were 2+ in bilateral achilles, patella, biceps, BR and triceps. Babinsky: flexor responses b/l.   Hoffmans: negative b/l Sensory exam: revealed normal sensation in all dermatomal regions in {Blank  multiple:19196::"***","bilateral upper extremities","bilateral lower extremities","right upper extremity","left upper extremity","right lower extremity","left lower extremity","with reduced sensation to light touch in ***"} Motor exam: strength 5/5 throughout {Blank multiple:19196::"***","bilateral upper extremities","bilateral lower extremities","right upper extremity","left upper  extremity","right lower extremity","left lower extremity","with exception of ***"} Coordination: Fine motor coordination was normal.   Gait: {Blank single:19197::"not observed due to safety concerns","normal","+trendelenberg","+antalgic gait","+foot drop","***"}   Ankle/Foot exam:  Inspection: inspection revealed no gross abnormality or deformity.  Arch exam revealed *** bilaterally  .    Palpation: Palpation revealed no erythema or edema.  No TTP at the distal tibia or fibula. Lateral Ankle revealed no TTP at the ATFL, CFC, or PTFL.  Medial Ankle revealed no TTP at the deltoid ligament. There is tenderness to palpation at ***.    ROM:  Passive dorsiflexion is (10 to 15),  passive plantar flexion (50 to 70).  Inversion  (40) Eversion  (10)  Resisted dorsiflexion (testing TA): unable to tolerate Resisted PF (testing gastroc, peroneal longus, post tib): unable to tolerate Resisted eversion (testing peroneal longus and brevis): unable to tolerate Resisted inversion (testing post tib, tib anterior): unable to tolerate  Single heel raise: ***  Special/Provocative testing:   Anterior drawer test revealed normal movement and stability. ***   Talar tilt test for ATFL/CFL (inversion at talus causes pain or increased gapping).  *** Kleigers test for deltoid ligament stability (pain/lack of stability with external rotation of talus).  *** Squeeze test (for syndesmotic pathology).   *** Distal tibiofibular joint play test (syndesmosis strain).  *** Thompson test for achilles tendon injury.  *** Tarsal tunnel  tinnels. *** Mulder sign (pain with pressure at intermetatarsal area). ***   Heel Tap/Bump test for stress fracture. ***   Information in () parenthesis is normals/details of specific exam.       Assessment & Plan:   Sarah Garrison is a 41 y.o. year old female  who  has a past medical history of Anxiety, Asthma, Chronic constipation, Chronic shortness of breath, Depression, GERD (gastroesophageal reflux disease), History of COVID-19 (09/29/2019), History of seizures, Hypertension, Insulin dependent type 2 diabetes mellitus ([place]), Insulin pump in place, Mixed hyperlipidemia, OSA (obstructive sleep apnea), and Plantar fasciitis, right.   They are presenting to PM&R clinic as a new patient for treatment of *** . They were referred by *** . Based on their presentation, *** .  There are no diagnoses linked to this encounter.

## 2022-07-25 ENCOUNTER — Encounter
Payer: Medicaid Other | Attending: Physical Medicine and Rehabilitation | Admitting: Physical Medicine and Rehabilitation

## 2022-07-25 ENCOUNTER — Other Ambulatory Visit: Payer: Self-pay

## 2022-07-25 ENCOUNTER — Encounter: Payer: Self-pay | Admitting: Physical Medicine and Rehabilitation

## 2022-07-25 VITALS — BP 144/88 | HR 116 | Ht 64.0 in | Wt 254.0 lb

## 2022-07-25 DIAGNOSIS — E1140 Type 2 diabetes mellitus with diabetic neuropathy, unspecified: Secondary | ICD-10-CM | POA: Diagnosis present

## 2022-07-25 DIAGNOSIS — M79604 Pain in right leg: Secondary | ICD-10-CM

## 2022-07-25 DIAGNOSIS — F418 Other specified anxiety disorders: Secondary | ICD-10-CM

## 2022-07-25 MED ORDER — DULOXETINE HCL 30 MG PO CPEP
30.0000 mg | ORAL_CAPSULE | Freq: Every day | ORAL | 3 refills | Status: DC
  Filled 2022-07-25 – 2022-07-26 (×2): qty 60, 30d supply, fill #0

## 2022-07-25 NOTE — Patient Instructions (Addendum)
Start Duloxetine 30 mg capsules. Take 1 capsule at nighttime for 1 week, then increase to 2 capsules.  I have ordered an Mri of your right foot.   Follow up in 1 month

## 2022-07-26 ENCOUNTER — Other Ambulatory Visit: Payer: Self-pay

## 2022-07-26 DIAGNOSIS — M79604 Pain in right leg: Secondary | ICD-10-CM | POA: Insufficient documentation

## 2022-07-26 NOTE — Assessment & Plan Note (Addendum)
Etiology uncertain at this time. Exquisite non-dermatomal sensitivity post-surgery effecting medial R foot on both dorsal and plantar surfaces, and event into the ankle.  Patient initially refused EMG (prior bad experience with [facility] UE EMG) so MRI foot was ordered; however on chart review this was already done recently 02/2022 with findings of: IMPRESSION: 1. Findings consistent with moderate plantar fasciitis. No evidence of fascial rupture or significant surrounding soft tissue or marrow edema. 2. Mild midfoot degenerative changes without acute osseous findings. 3. The ankle tendons and ligaments appear unremarkable.  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 Will also discuss work accommodations for standing >30 minutes due to intense pain.   Cancelled MRI foot; Follow up in 1 month

## 2022-07-26 NOTE — Assessment & Plan Note (Signed)
Start Duloxetine 30 mg capsules. Take 1 capsule at nighttime for 1 week, then increase to 2 capsules.  Prior failed lyrica, gabapentin  May be candidate for Qutenza, will discuss at follow up

## 2022-07-27 ENCOUNTER — Encounter: Payer: Self-pay | Admitting: Physical Medicine and Rehabilitation

## 2022-07-30 ENCOUNTER — Other Ambulatory Visit: Payer: Self-pay

## 2022-07-30 MED ORDER — INSULIN ASPART 100 UNIT/ML IJ SOLN
INTRAMUSCULAR | 5 refills | Status: DC
  Filled 2022-07-30: qty 100, 40d supply, fill #0
  Filled 2022-09-06: qty 100, 30d supply, fill #0
  Filled 2022-10-23: qty 100, 30d supply, fill #1
  Filled 2023-01-17: qty 100, 40d supply, fill #2

## 2022-07-30 MED ORDER — ATORVASTATIN CALCIUM 20 MG PO TABS
20.0000 mg | ORAL_TABLET | Freq: Every day | ORAL | 1 refills | Status: DC
  Filled 2022-07-30: qty 90, 90d supply, fill #0

## 2022-08-02 ENCOUNTER — Other Ambulatory Visit: Payer: Self-pay

## 2022-08-03 ENCOUNTER — Other Ambulatory Visit: Payer: Self-pay

## 2022-08-08 ENCOUNTER — Other Ambulatory Visit: Payer: Self-pay

## 2022-08-09 ENCOUNTER — Encounter: Payer: Self-pay | Admitting: Podiatry

## 2022-08-09 ENCOUNTER — Ambulatory Visit: Payer: Medicaid Other | Admitting: Podiatry

## 2022-08-09 DIAGNOSIS — Z794 Long term (current) use of insulin: Secondary | ICD-10-CM

## 2022-08-09 DIAGNOSIS — L600 Ingrowing nail: Secondary | ICD-10-CM

## 2022-08-09 DIAGNOSIS — E1140 Type 2 diabetes mellitus with diabetic neuropathy, unspecified: Secondary | ICD-10-CM

## 2022-08-09 NOTE — Patient Instructions (Signed)

## 2022-08-09 NOTE — Progress Notes (Signed)
Subjective:  Patient ID: Sarah Garrison, female    DOB: 1981-02-07,  MRN: [id number]  Chief Complaint  Patient presents with   Toe Pain    right great toe swollen around the nail- medial border. Soreness to border. Denies any pus or bleeding.     42 y.o. female presents with concern for pain and swelling around the right great toe.  Pain on the inside border.  She says it is sore with any pressure on the area and wearing certain shoes.  She denies any drainage.  She recently had a pedicure and was told it was not ingrown however he continues to have pain.  Patient does have a history of type 2 diabetes with neuropathy.  Past Medical History:  Diagnosis Date   Anxiety    Asthma    Chronic constipation    Chronic shortness of breath    Depression    GERD (gastroesophageal reflux disease)    History of COVID-19 09/29/2019   09-28-2020 result in epic; hospital admission 10-05-2019 for covid pneumonia with hypoxic acute respiratory failure, no oxygen needed when discharged 10-06-2019;  and positive 06-13-2020 result in epic, pt stated no symptoms   History of seizures    (05-04-2021  pt stated was told during hospital admission 05/ 2021 with covid , she had 2 seizures,  stated no medication and no seizure since)   Hypertension    Insulin dependent type 2 diabetes mellitus [facility])    endocrinologist-- Jacolyn Reedy NP;   pt uses insulin pump   Insulin pump in place    Mixed hyperlipidemia    OSA (obstructive sleep apnea)    ( 05-04-2021 pt stated has not used cpap since 05/ 2021) followed by dr byrum;  study in epic 02-16-2019 moderate , titrated cpap 08-28-2019   Plantar fasciitis, right    w/ chronic pain    Allergies  Allergen Reactions   Morphine And Related Shortness Of Breath   Glyburide Diarrhea and Other (See Comments)    Reaction:  Nose bleeds    Hydrocodone Other (See Comments)    Pt states that this medication makes her "dream about rabbits chasing her"   Ivp Dye  [Iodinated Contrast Media] Other (See Comments)    Shortness of breath.     Oxycodone Other (See Comments)    Pt states that this medication makes her "dream about rabbits chasing" her.      ROS: Negative except as per HPI above  Objective:  General: AAO x3, NAD  Dermatological: Incurvation is present along the medial nail border of the right great toe. There is localized edema without any erythema or increase in warmth around the nail border. There is no drainage or pus. There is no ascending cellulitis. No malodor. No open lesions or pre-ulcerative lesions.    Vascular:  Dorsalis Pedis artery and Posterior Tibial artery pedal pulses are 2/4 bilateral.  Capillary fill time < 3 sec to all digits.   Neruologic: Grossly intact via light touch bilateral. Protective threshold intact to all sites bilateral.   Musculoskeletal: No gross boney pedal deformities bilateral. No pain, crepitus, or limitation noted with foot and ankle range of motion bilateral. Muscular strength 5/5 in all groups tested bilateral.  Gait: Unassisted, Nonantalgic.   No images are attached to the encounter.   Assessment:   1. Ingrown nail of great toe of right foot   2. Type 2 diabetes mellitus with diabetic neuropathy, with long-term current use of insulin ([place])  Plan:  Patient was evaluated and treated and all questions answered.    Ingrown Nail, right -Patient elects to proceed with minor surgery to remove ingrown toenail today. Consent reviewed and signed by patient. -Ingrown nail excised. See procedure note. -Educated on post-procedure care including soaking. Written instructions provided and reviewed. -Patient to follow up in 2 weeks for nail check.  Procedure: Excision of Ingrown Toenail Location: Right 1st toe medial nail borders. Anesthesia: Lidocaine 1% plain; 1.5 mL and Marcaine 0.5% plain; 1.5 mL, digital block. Skin Prep: Betadine. Dressing: Silvadene; telfa; dry, sterile, compression  dressing. Technique: Following skin prep, the toe was exsanguinated and a tourniquet was secured at the base of the toe. The affected nail border was freed, split with a nail splitter, and excised. Chemical matrixectomy was then performed with phenol and irrigated out with alcohol. The tourniquet was then removed and sterile dressing applied. Disposition: Patient tolerated procedure well. Patient to return in 2 weeks for follow-up.    Return in about 3 weeks (around 08/30/2022) for f/u R hallux ingrown.          Everitt Amber, DPM [facility]

## 2022-08-17 ENCOUNTER — Encounter (HOSPITAL_COMMUNITY): Payer: Self-pay

## 2022-08-17 ENCOUNTER — Ambulatory Visit (HOSPITAL_COMMUNITY)
Admission: EM | Admit: 2022-08-17 | Discharge: 2022-08-17 | Disposition: A | Discharge status: Home / Self Care | Payer: Medicaid Other | Attending: Internal Medicine | Admitting: Internal Medicine

## 2022-08-17 DIAGNOSIS — Z794 Long term (current) use of insulin: Secondary | ICD-10-CM | POA: Insufficient documentation

## 2022-08-17 DIAGNOSIS — R051 Acute cough: Secondary | ICD-10-CM

## 2022-08-17 DIAGNOSIS — Z8616 Personal history of COVID-19: Secondary | ICD-10-CM | POA: Insufficient documentation

## 2022-08-17 DIAGNOSIS — U071 COVID-19: Secondary | ICD-10-CM | POA: Diagnosis present

## 2022-08-17 DIAGNOSIS — J45909 Unspecified asthma, uncomplicated: Secondary | ICD-10-CM | POA: Insufficient documentation

## 2022-08-17 DIAGNOSIS — J019 Acute sinusitis, unspecified: Secondary | ICD-10-CM | POA: Diagnosis not present

## 2022-08-17 DIAGNOSIS — Z9641 Presence of insulin pump (external) (internal): Secondary | ICD-10-CM | POA: Insufficient documentation

## 2022-08-17 DIAGNOSIS — E119 Type 2 diabetes mellitus without complications: Secondary | ICD-10-CM | POA: Insufficient documentation

## 2022-08-17 DIAGNOSIS — J209 Acute bronchitis, unspecified: Secondary | ICD-10-CM | POA: Diagnosis not present

## 2022-08-17 LAB — POC INFLUENZA A AND B ANTIGEN (URGENT CARE ONLY)
INFLUENZA A ANTIGEN, POC: NEGATIVE
INFLUENZA B ANTIGEN, POC: NEGATIVE

## 2022-08-17 MED ORDER — DOXYCYCLINE HYCLATE 100 MG PO CAPS: 100.0000 mg | ORAL_CAPSULE | Freq: Two times a day (BID) | ORAL | 0 refills | Status: AC

## 2022-08-17 MED ORDER — GUAIFENESIN 100 MG/5ML PO SYRP: 10.0000 mL | ORAL_SOLUTION | ORAL | 0 refills | Status: DC | PRN

## 2022-08-17 MED ORDER — FLUTICASONE PROPIONATE 50 MCG/ACT NA SUSP: 1.0000 | Freq: Every day | NASAL | 0 refills | Status: DC | PRN

## 2022-08-17 NOTE — ED Provider Notes (Signed)
[facility]   Note:  This document was prepared using Dragon voice recognition software and may include unintentional dictation errors.  MRN: [id number] DOB: Jun 09, 1980 DATE: 08/17/22   Subjective:  Chief Complaint:  Chief Complaint  Patient presents with   Cough   Nasal Congestion   Headache     HPI: Sarah Garrison is a 42 y.o. female presenting for nasal congestion and productive cough for the past 2 days.  No known sick contact.  Reports postnasal drip and sore throat.  She has tried over-the-counter cough syrup with no improvement.  Patient has a history of asthma and is diabetic.  Blood glucose at the time of exam was 147 per continuous glucose monitor.  Denies fever, otalgia, nausea/vomiting, abdominal pain, SOB. Presents NAD.  Prior to Admission medications   Medication Sig Start Date End Date Taking? Authorizing Provider  atorvastatin (LIPITOR) 40 MG tablet Take 1 tablet (40 mg total) by mouth daily. 05/31/22  Yes Newlin, Charlane Ferretti, MD  Continuous Blood Gluc Sensor (DEXCOM G6 SENSOR) MISC USE AND CHANGE SENSOR EVERY 10 DAYS AS DIRECTED 03/14/22  Yes [provider]  Continuous Blood Gluc Sensor (DEXCOM G6 SENSOR) MISC SMARTSIG:Topical Every 10 Days   Yes [provider]  Continuous Blood Gluc Transmit (DEXCOM G6 TRANSMITTER) MISC USE TO CHECK BLOOD SUGAR EVERY DAY. CHANGE EVERY 3 MONTHS AS DIRECTED 03/14/22  Yes [provider]  doxycycline (VIBRAMYCIN) 100 MG capsule Take 1 capsule (100 mg total) by mouth 2 (two) times daily for 10 days. 08/17/22 08/27/22 Yes Layn Kye P, PA-C  famotidine (PEPCID) 20 MG tablet Take 1 tablet (20 mg total) by mouth at bedtime. 05/31/22  Yes Charlott Rakes, MD  fluticasone (FLONASE) 50 MCG/ACT nasal spray Place 1 spray into both nostrils daily as needed for allergies or rhinitis. 08/17/22  Yes Jayden Rudge P, PA-C  guaifenesin (ROBITUSSIN) 100 MG/5ML syrup Take 10 mLs by mouth every 4 (four) hours as  needed for cough. 08/17/22  Yes Abhi Moccia P, PA-C  insulin aspart (NOVOLOG FLEXPEN) 100 UNIT/ML FlexPen Inject into the skin 3 (three) times daily before meals. ONLY WHEN INSULIN PUMP MALFUNCTION'S PER ENDOCRINOLOGY DIRECTION   Yes [provider]  insulin aspart (NOVOLOG) 100 UNIT/ML injection USE VIA INSULIN PUMP. MAX TOTAL DAILY DOSE OF 200 UNITS 11/13/21  Yes   insulin aspart (NOVOLOG) 100 UNIT/ML injection USE VIA INSULIN PUMP. MAX TOTAL DAILY DOSE OF 200 UNITS 03/27/22  Yes   insulin aspart (NOVOLOG) 100 UNIT/ML injection USE VIA INSULIN PUMP. MAX TOTAL DAILY DOSE OF 250 UNITS 07/30/22  Yes   insulin glargine, 1 Unit Dial, (TOUJEO SOLOSTAR) 300 UNIT/ML Solostar Pen Administer 50 units once daily only if insulin pump malfunction 02/24/21  Yes   Insulin Human (INSULIN PUMP) SOLN Inject 1 each into the skin See admin instructions. Medication: Novolog 100 units/ml injection. Takes 100 units via pump per patient.   Yes [provider]  Insulin Syringe-Needle U-100 31G X 5/16" 1 ML MISC inject 10 units into the skin 3 times daily. use to inject novolog based on insulin to card ratio- max daily dose 150 units 09/23/20  Yes   pantoprazole (PROTONIX) 40 MG tablet TAKE 1 TABLET (40 MG TOTAL) BY MOUTH DAILY. 12/13/21  Yes Charlott Rakes, MD  sertraline (ZOLOFT) 100 MG tablet Take 1 tablet (100 mg total) by mouth daily. 12/13/21  Yes Newlin, Charlane Ferretti, MD  ammonium lactate (AMLACTIN) 12 % lotion Apply 1 Application topically as needed for dry skin. 04/13/22  Felipa Furnace, DPM  atorvastatin (LIPITOR) 20 MG tablet Take 1 tablet (20 mg total) by mouth daily. 07/30/22     DULoxetine (CYMBALTA) 30 MG capsule take 1 capsule by mouth at bedtime x 1 week, then increase to 2 capsules at nighttime. (Call clinic if any side effects or concerns) 07/25/22   Gertie Gowda, DO  ibuprofen (ADVIL) 800 MG tablet Take 1 tablet (800 mg total) by mouth every 8 (eight) hours as needed. 07/13/21   Lamptey, Myrene Galas, MD  levocetirizine (XYZAL ALLERGY 24HR) 5 MG tablet Take 1 tablet (5 mg total) by mouth every evening. 03/16/22   Jaynee Eagles, PA-C  olopatadine (PATANOL) 0.1 % ophthalmic solution Place 1 drop into both eyes 2 (two) times daily. 12/13/21   Charlott Rakes, MD  ondansetron (ZOFRAN ODT) 8 MG disintegrating tablet Take 1 tablet (8 mg total) by mouth every 8 (eight) hours as needed for nausea or vomiting. 02/21/21   Hazel Sams, PA-C  Vitamin D, Ergocalciferol, (DRISDOL) 1.25 MG (50000 UNIT) CAPS capsule Take 1 capsule (50,000 Units total) by mouth every 7 (seven) days. 08/01/21   Tobb, Kardie, DO  ARIPiprazole (ABILIFY) 5 MG tablet Take 1 tablet (5 mg total) by mouth daily. 10/27/19 06/13/20  Sater, Nanine Means, MD     Allergies  Allergen Reactions   Morphine And Related Shortness Of Breath   Glyburide Diarrhea and Other (See Comments)    Reaction:  Nose bleeds    Hydrocodone Other (See Comments)    Pt states that this medication makes her "dream about rabbits chasing her"   Ivp Dye [Iodinated Contrast Media] Other (See Comments)    Shortness of breath.     Oxycodone Other (See Comments)    Pt states that this medication makes her "dream about rabbits chasing" her.      History:   Past Medical History:  Diagnosis Date   Anxiety    Asthma    Chronic constipation    Chronic shortness of breath    Depression    GERD (gastroesophageal reflux disease)    History of COVID-19 09/29/2019   09-28-2020 result in epic; hospital admission 10-05-2019 for covid pneumonia with hypoxic acute respiratory failure, no oxygen needed when discharged 10-06-2019;  and positive 06-13-2020 result in epic, pt stated no symptoms   History of seizures    (05-04-2021  pt stated was told during hospital admission 05/ 2021 with covid , she had 2 seizures,  stated no medication and no seizure since)   Hypertension    Insulin dependent type 2 diabetes mellitus [facility])    endocrinologist-- Jacolyn Reedy NP;   pt uses  insulin pump   Insulin pump in place    Mixed hyperlipidemia    OSA (obstructive sleep apnea)    ( 05-04-2021 pt stated has not used cpap since 05/ 2021) followed by dr byrum;  study in epic 02-16-2019 moderate , titrated cpap 08-28-2019   Plantar fasciitis, right    w/ chronic pain     Past Surgical History:  Procedure Laterality Date   Kim Right 05/08/2021   Procedure: ENDOSCOPIC PLANTAR FASCIOTOMY;  Surgeon: Landis Martins, DPM;  Location: [place];  Service: Podiatry;  Laterality: Right;  WITH BLOCK   STERIOD INJECTION Bilateral 05/08/2021   Procedure: PLATELET RICH PLASMA INJECTION HEELS;  Surgeon: Landis Martins, DPM;  Location: [place];  Service: Podiatry;  Laterality: Bilateral;   TUBAL LIGATION  Bilateral 09/29/2006   @[facility] ;   [facility]   VENTRAL HERNIA REPAIR N/A 11/27/2017   Procedure: VENTRAL HERNIA REPAIR ERAS PATHWAY;  Surgeon: Erroll Luna, MD;  Location: [place];  Service: General;  Laterality: N/A;   WISDOM TOOTH EXTRACTION      Family History  Problem Relation Age of Onset   Asthma Mother    Kidney failure Mother    Brain cancer Mother    Asthma Father    Other Father        surgery on stomach but don't know from what   Diabetes Brother    Diabetes Maternal Grandmother     Social History   Tobacco Use   Smoking status: Former    Packs/day: 0.10    Years: 2.00    Additional pack years: 0.00    Total pack years: 0.20    Types: Cigarettes    Quit date: 11/27/2017    Years since quitting: 4.7   Smokeless tobacco: Never  Vaping Use   Vaping Use: Some days   Devices: Elfvar  (05-04-2021 per pt last vaped approx 2 wks ago)  Substance Use Topics   Alcohol use: No   Drug use: Never    Review of Systems  Constitutional:  Negative for fever.  HENT:  Positive for congestion, postnasal drip, rhinorrhea, sinus pressure, sinus pain and sore throat. Negative for ear  pain.   Respiratory:  Positive for cough.   Gastrointestinal:  Negative for abdominal pain, nausea and vomiting.  Neurological:  Positive for headaches.     Objective:   Vitals: BP (!) 147/83 (BP Location: Right Arm)   Pulse (!) 107   Temp 98.6 F (37 C) (Oral)   Resp 16   SpO2 92%   Physical Exam Constitutional:      General: She is not in acute distress.    Appearance: Normal appearance. She is well-developed. She is morbidly obese. She is not ill-appearing or toxic-appearing.  HENT:     Head: Normocephalic and atraumatic.     Right Ear: Tympanic membrane and ear canal normal.     Left Ear: Tympanic membrane and ear canal normal.     Nose:     Right Turbinates: Enlarged.     Left Turbinates: Enlarged.     Mouth/Throat:     Pharynx: Oropharynx is clear. Uvula midline. No posterior oropharyngeal erythema.     Tonsils: No tonsillar exudate or tonsillar abscesses.  Cardiovascular:     Rate and Rhythm: Normal rate and regular rhythm.     Heart sounds: Normal heart sounds.  Pulmonary:     Effort: Pulmonary effort is normal.     Breath sounds: Normal breath sounds.     Comments: Clear to auscultation bilaterally  Abdominal:     General: Bowel sounds are normal.     Palpations: Abdomen is soft.     Tenderness: There is no abdominal tenderness.  Skin:    General: Skin is warm and dry.  Neurological:     General: No focal deficit present.     Mental Status: She is alert.  Psychiatric:        Mood and Affect: Mood and affect normal.     Results:  Labs: Results for orders placed or performed during the hospital encounter of 08/17/22 (from the past 24 hour(s))  POC Influenza A & B Ag (Urgent Care)     Status: None   Collection Time: 08/17/22  7:57 PM  Result Value Ref Range  INFLUENZA A ANTIGEN, POC NEGATIVE NEGATIVE   INFLUENZA B ANTIGEN, POC NEGATIVE NEGATIVE    Radiology: No results found.   UC Course/Treatments:  Procedures: Procedures   Medications  Ordered in UC: Medications - No data to display   Assessment and Plan :     ICD-10-CM   1. Acute non-recurrent sinusitis, unspecified location  J01.90     2. Acute bronchitis, unspecified organism  J20.9     3. Acute cough  R05.1      Acute non-recurrent sinusitis, unspecified location Afebrile, nontoxic-appearing, NAD. VSS. DDX includes but not limited to: Flu, COVID, bacterial sinusitis Flu was negative today in office.  COVID is pending.  Doxycycline was prescribed to empirically treat for bacterial sinusitis and bronchitis given that patient is diabetic and has a history of asthma. Strict ED precautions were given and patient verbalized understanding.  Acute bronchitis, unspecified organism Afebrile, nontoxic-appearing, NAD. VSS. DDX includes but not limited to: Flu, COVID, bacterial sinusitis Flu was negative today in office.  COVID is pending.  Doxycycline was prescribed to empirically treat for bacterial sinusitis and bronchitis given that patient is diabetic and has a history of asthma. Strict ED precautions were given and patient verbalized understanding.  Acute cough Afebrile, nontoxic-appearing, NAD. VSS. DDX includes but not limited to: Flu, COVID, bacterial sinusitis Flu was negative today in office.  COVID is pending.  Doxycycline was prescribed to empirically treat for bacterial sinusitis and bronchitis given that patient is diabetic and has a history of asthma. Strict ED precautions were given and patient verbalized understanding.      ED Discharge Orders          Ordered    fluticasone (FLONASE) 50 MCG/ACT nasal spray  Daily PRN        08/17/22 2016    guaifenesin (ROBITUSSIN) 100 MG/5ML syrup  Every 4 hours PRN        08/17/22 2016    doxycycline (VIBRAMYCIN) 100 MG capsule  2 times daily        08/17/22 2016             PDMP not reviewed this encounter.     Carmie End, PA-C 08/17/22 2040

## 2022-08-17 NOTE — Discharge Instructions (Signed)
Your flu test was negative and your COVID test has been sent to the lab. Someone from our office will call you with your results. A prescription was sent for Doxycycline. This is an antibiotic used to treat upper respiratory symptoms. Take as directed. Return in 2 to 3 days if no improvement. It is very important for you to pay attention to any new symptoms or worsening of your current condition.  Please go directly to the Emergency Department immediately should you begin to have any of the following symptoms: shortness of breath, chest pain or difficulty breathing.

## 2022-08-17 NOTE — ED Triage Notes (Signed)
Pt presents to the office nasal congestion, cough and headache x 2 days. Pt reports big toe pain on her right foot.

## 2022-08-18 ENCOUNTER — Telehealth (HOSPITAL_COMMUNITY): Payer: Self-pay | Admitting: Internal Medicine

## 2022-08-18 DIAGNOSIS — U071 COVID-19: Secondary | ICD-10-CM

## 2022-08-18 LAB — SARS CORONAVIRUS 2 (TAT 6-24 HRS): SARS Coronavirus 2: POSITIVE — AB

## 2022-08-18 MED ORDER — PAXLOVID (300/100) 20 X 150 MG & 10 X 100MG PO TBPK: 3.0000 | ORAL_TABLET | Freq: Two times a day (BID) | ORAL | 0 refills | Status: DC

## 2022-08-18 NOTE — Telephone Encounter (Signed)
Patient tested positive for COVID-19 and qualifies for the antiviral therapy due to history of asthma and diabetes.  She states that her COVID-19 symptoms have improved significantly and she is no longer febrile.  Would like to return to work.  Discussed current CDC guidelines to wear mask for at least 5 days, then may return to normal.  Paxlovid sent to pharmacy to be taken as directed.  Advised patient to hold her atorvastatin while taking Paxlovid and resume taking atorvastatin once Paxlovid is finished.  Creatinine and GFR from December 2023 are normal.  Strict ER and urgent care return precautions discussed via phone.  Patient expresses understanding and agreement with plan.

## 2022-08-21 ENCOUNTER — Telehealth (HOSPITAL_COMMUNITY): Payer: Self-pay | Admitting: Emergency Medicine

## 2022-08-21 NOTE — Telephone Encounter (Signed)
Patient called and states she is having difficulty with her job and her return to work date from having [employer].  Per new guidelines, that patient is aware of, you can return after 24 hours fever free.  Patient states her job is not accepting that.  I reviewed her provider note, and based off of the note, today she would be able to return if she was following 5 days like the original quarantine recommendations.  I wrote her a note saying she could return tomorrow, and encouraged patient to reach out with any additional questions

## 2022-08-22 ENCOUNTER — Encounter
Payer: Medicaid Other | Attending: Physical Medicine and Rehabilitation | Admitting: Physical Medicine and Rehabilitation

## 2022-08-22 ENCOUNTER — Encounter: Payer: Self-pay | Admitting: Physical Medicine and Rehabilitation

## 2022-08-22 VITALS — BP 152/91 | HR 113 | Ht 64.0 in | Wt 250.4 lb

## 2022-08-22 DIAGNOSIS — M79604 Pain in right leg: Secondary | ICD-10-CM | POA: Insufficient documentation

## 2022-08-22 DIAGNOSIS — M25561 Pain in right knee: Secondary | ICD-10-CM | POA: Diagnosis not present

## 2022-08-22 DIAGNOSIS — M25562 Pain in left knee: Secondary | ICD-10-CM | POA: Insufficient documentation

## 2022-08-22 DIAGNOSIS — M7712 Lateral epicondylitis, left elbow: Secondary | ICD-10-CM | POA: Insufficient documentation

## 2022-08-22 DIAGNOSIS — M792 Neuralgia and neuritis, unspecified: Secondary | ICD-10-CM | POA: Insufficient documentation

## 2022-08-22 NOTE — Patient Instructions (Signed)
Continue duloxetine 30 mg capsules at nighttime.  Increase to 2 capsules at nighttime, and monitor for side effects such as worsening nightmares or insomnia.  If you experience these, drop back to 1 capsule at nighttime for at least 1 week, after that, you may either continue the medication or discontinue it if it is not benefiting your pain.  Please call the clinic and let me know if you have any other concerning side effects, or questions about this medication.  If you cannot tolerate this medication, I will look into a trial of Qutenza for you.  I am going to schedule you for an EMG of your right foot for tarsal tunnel syndrome.  We will do the nerve conduction for the test first, and if you do not need to the needling portion, we will not do it.  I ordered x-rays of your bilateral knees.  Depending on those results, I may schedule you for Orthovisc injections given that steroid injections have increased her sugars too much in the past.  We will call you about those results and plans.  For your left elbow, apply ice as needed and Voltaren gel up to 4 times daily to the elbow to reduce inflammation.  When doing activities that exacerbate your pain, you can also look into getting a tennis elbow brace to wear.  We will schedule a follow-up to review everything after your EMG.  If we can get the pain in your foot under better control, we will recommend PT at that time.

## 2022-08-22 NOTE — Progress Notes (Signed)
Subjective:    Patient ID: Sarah Garrison, female    DOB: [birth year], 42 y.o.   MRN: [id number]  HPI   Sarah Garrison is a 42 y.o. year old female  who  has a past medical history of Anxiety, Asthma, Chronic constipation, Chronic shortness of breath, Depression, GERD (gastroesophageal reflux disease), History of COVID-19 (09/29/2019), History of seizures, Hypertension, Insulin dependent type 2 diabetes mellitus ([place]), Insulin pump in place, Mixed hyperlipidemia, OSA (obstructive sleep apnea), and Plantar fasciitis, right.   They are presenting to PM&R clinic for follow up related to r ankle/foot pain .Based on their presentation, their pain may be due to medial tarsal tunnel syndrome vs. CRPS type II vs. Diabetic neuropathy .  Plan from last visit:  Pain of right lower extremity Assessment & Plan: Etiology uncertain at this time. Exquisite non-dermatomal sensitivity post-surgery effecting medial R foot on both dorsal and plantar surfaces, and event into the ankle.   Patient initially refused EMG (prior bad experience with [facility] UE EMG) so MRI foot was ordered; however on chart review this was already done recently 02/2022 with findings of: IMPRESSION: 1. Findings consistent with moderate plantar fasciitis. No evidence of fascial rupture or significant surrounding soft tissue or marrow edema. 2. Mild midfoot degenerative changes without acute osseous findings. 3. The ankle tendons and ligaments appear unremarkable.   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 Will also discuss work accommodations for standing >30 minutes due to intense pain.    Cancelled MRI foot; Follow up in 1 month         Type 2 diabetes mellitus with diabetic neuropathy, unspecified whether long term insulin use (HCC) Assessment & Plan: Start Duloxetine 30 mg  capsules. Take 1 capsule at nighttime for 1 week, then increase to 2 capsules.   Prior failed lyrica, gabapentin   May be candidate for Qutenza, will discuss at follow up     Interval Hx:  - Overall pain in her foot doing better  - Medications: She started duloxetine, has only been taking 1 pill at nighttime, has not increased it. She endorses recently getting nightmares at 4 am, not sure what medication is doing it.   - Other concerns: Patient states that after out last visit, the physical exam caused her to have severe pain in her 1st toe for 2 weeks.   - New rash in her left antecubital area, for 3 weeks.  - New weakness in her left elbow, starting 3 weeks ago. Some associated pain, mostly in the elbow and the bicep. No notable injury.  - Has noticed instability in her knees when walking, especailly up steps. Has never been Dx with arthritis. L>R knee; R used to be worse.  - Got COVID March 15th from a coworker; some issue with how long to stay out of work, was placed on leave for 90 days until they can re-test her.  Pain Inventory Average Pain 8 Pain Right Now 5 My pain is sharp, stabbing, tingling, and aching  In the last 24 hours, has pain interfered with the following? General activity 0 Relation with others 1 Enjoyment of life 10 What TIME of day is your pain at its worst? morning , daytime, evening, and night Sleep (in general) NA  Pain is worse with: walking, sitting, standing, and some activites Pain improves with: heat/ice and therapy/exercise Relief  from Meds: 2  Family History  Problem Relation Age of Onset   Asthma Mother    Kidney failure Mother    Brain cancer Mother    Asthma Father    Other Father        surgery on stomach but don't know from what   Diabetes Brother    Diabetes Maternal Grandmother    Social History   Socioeconomic History   Marital status: Single    Spouse name: Not on file   Number of children: 4   Years of education: Not on file    Highest education level: Not on file  Occupational History   Not on file  Tobacco Use   Smoking status: Former    Packs/day: 0.10    Years: 2.00    Additional pack years: 0.00    Total pack years: 0.20    Types: Cigarettes    Quit date: 11/27/2017    Years since quitting: 4.7   Smokeless tobacco: Never  Vaping Use   Vaping Use: Some days   Devices: Elfvar  (05-04-2021 per pt last vaped approx 2 wks ago)  Substance and Sexual Activity   Alcohol use: No   Drug use: Never   Sexual activity: Yes    Birth control/protection: Surgical, Injection  Other Topics Concern   Not on file  Social History Narrative   Right handed   No caffeine use   Diet coke sometimes    Social Determinants of Health   Financial Resource Strain: Not on file  Food Insecurity: Not on file  Transportation Needs: Not on file  Physical Activity: Not on file  Stress: Not on file  Social Connections: Not on file   Past Surgical History:  Procedure Laterality Date   [place] Right 05/08/2021   Procedure: ENDOSCOPIC PLANTAR FASCIOTOMY;  Surgeon: Landis Martins, DPM;  Location: [facility];  Service: Podiatry;  Laterality: Right;  WITH BLOCK   STERIOD INJECTION Bilateral 05/08/2021   Procedure: PLATELET RICH PLASMA INJECTION HEELS;  Surgeon: Landis Martins, DPM;  Location: [facility];  Service: Podiatry;  Laterality: Bilateral;   TUBAL LIGATION Bilateral 09/29/2006   @[facility] ;   [facility]   VENTRAL HERNIA REPAIR N/A 11/27/2017   Procedure: VENTRAL HERNIA REPAIR ERAS PATHWAY;  Surgeon: Erroll Luna, MD;  Location: [facility];  Service: General;  Laterality: N/A;   WISDOM TOOTH EXTRACTION     Past Surgical History:  Procedure Laterality Date   CESAREAN SECTION  1999   PLANTAR FASCIA RELEASE Right 05/08/2021   Procedure: ENDOSCOPIC PLANTAR FASCIOTOMY;  Surgeon: Landis Martins, DPM;  Location: [facility];   Service: Podiatry;  Laterality: Right;  WITH BLOCK   STERIOD INJECTION Bilateral 05/08/2021   Procedure: PLATELET RICH PLASMA INJECTION HEELS;  Surgeon: Landis Martins, DPM;  Location: [facility];  Service: Podiatry;  Laterality: Bilateral;   TUBAL LIGATION Bilateral 09/29/2006   @[facility] ;   [facility]   VENTRAL HERNIA REPAIR N/A 11/27/2017   Procedure: [place];  Surgeon: Erroll Luna, MD;  Location: [facility];  Service: General;  Laterality: N/A;   WISDOM TOOTH EXTRACTION     Past Medical History:  Diagnosis Date   Anxiety    Asthma    Chronic constipation    Chronic shortness of breath    Depression    GERD (gastroesophageal reflux disease)    History of COVID-19 09/29/2019  09-28-2020 result in epic; hospital admission 10-05-2019 for covid pneumonia with hypoxic acute respiratory failure, no oxygen needed when discharged 10-06-2019;  and positive 06-13-2020 result in epic, pt stated no symptoms   History of seizures    (05-04-2021  pt stated was told during hospital admission 05/ 2021 with covid , she had 2 seizures,  stated no medication and no seizure since)   Hypertension    Insulin dependent type 2 diabetes mellitus [facility])    endocrinologist-- Jacolyn Reedy NP;   pt uses insulin pump   Insulin pump in place    Mixed hyperlipidemia    OSA (obstructive sleep apnea)    ( 05-04-2021 pt stated has not used cpap since 05/ 2021) followed by dr byrum;  study in epic 02-16-2019 moderate , titrated cpap 08-28-2019   Plantar fasciitis, right    w/ chronic pain   BP (!) 159/92   Pulse (!) 113   Ht 5\' 4"  (1.626 m)   Wt 250 lb 6.4 oz (113.6 kg)   SpO2 97%   BMI 42.98 kg/m   Opioid Risk Score:   Fall Risk Score:  `1  Depression screen PHQ 2/9     08/22/2022    1:19 PM 07/25/2022    3:19 PM 05/31/2022    4:32 PM 10/24/2020   10:40 AM 08/11/2020   10:05 AM 12/08/2019    9:44 AM 11/23/2019   10:41 AM  Depression screen PHQ 2/9   Decreased Interest 0 1 1 2  0 3 3  Down, Depressed, Hopeless 0 1 1 2  0 3 3  PHQ - 2 Score 0 2 2 4  0 6 6  Altered sleeping  1 1 3 1 3 3   Tired, decreased energy  1 1 3 1 3 3   Change in appetite  0 0 3 0 3 1  Feeling bad or failure about yourself   0 0 0 0 0 0  Trouble concentrating  0 0 1 1 3  0  Moving slowly or fidgety/restless  1 0 1 0 3 3  Suicidal thoughts  0 0 0 0 0 0  PHQ-9 Score  5 4 15 3 21 16   Difficult doing work/chores  Not difficult at all           Review of Systems  Constitutional: Negative.   HENT: Negative.    Eyes: Negative.   Respiratory: Negative.    Cardiovascular: Negative.   Gastrointestinal: Negative.   Endocrine: Negative.   Genitourinary: Negative.   Musculoskeletal: Negative.   Skin: Negative.   Allergic/Immunologic: Negative.   Neurological: Negative.   Hematological: Negative.   Psychiatric/Behavioral: Negative.    All other systems reviewed and are negative.      Objective:   Physical Exam   PE: Constitution: Appropriate appearance for age. No apparent distress  +Obese Resp: No respiratory distress. No accessory muscle usage.  Cardio: Well perfused appearance. 1+ bilateral peripheral edema. Abdomen: Nondistended. Nontender.   Psych: Appropriate mood and affect. Neuro: AAOx4. No apparent cognitive deficits   Neurologic Exam:   DTRs: Reflexes were 2+ in bilateral achilles, patella, biceps, BR and triceps. Babinsky: flexor responses b/l.   Hoffmans: negative b/l Sensory exam: revealed normal sensation in all dermatomal regions in bilateral lower extremities and with reduced sensation to light touch in lateral plantar foot on the right Motor exam: strength 5/5 throughout bilateral upper extremities and bilateral lower extremities Coordination: Fine motor coordination was normal.   Gait: normal  +Pain with resisted wrist extension over  L lateral epicondyle  Knee Exam: bilateral Inspection: No gross deformities seen. Mild effusion in L  knee.  No erythema present.  No evidence of Genu Valgum/Varum or Recurvatum.     Palpation: TTP bilateral medial joint lines. +bilateral very tight hamstrings. No crepitus present.    ROM:  Flexion  (135*), Extension  (0*).  No hyperextension noted  Pt able to squat with patellar tracking appropriately.  Strength Hamstring (bring knee back to table) 5 /5 Quadriceps (kick out) 5 /5  Special/Provacative testing:  Anterior Drawer Sign - -   Posterior Drawer Sign - - McMurrays (Meniscus)- -   Valgus Stress (Collateral Lig) joint should not open, if does open then MCL and ACL injury - - Varus Stress (Collateral Lig) - - Thessaly's Test (Meniscus) -  -      Assessment & Plan:   Sarah Garrison is a 42 y.o. year old female  who  has a past medical history of Anxiety, Asthma, Chronic constipation, Chronic shortness of breath, Depression, GERD (gastroesophageal reflux disease), History of COVID-19 (09/29/2019), History of seizures, Hypertension, Insulin dependent type 2 diabetes mellitus ([place]), Insulin pump in place, Mixed hyperlipidemia, OSA (obstructive sleep apnea), and Plantar fasciitis, right.   They are presenting to PM&R clinic for follow up related to r ankle/foot pain. Based on their presentation, their pain may be due to medial tarsal tunnel syndrome vs. CRPS type II vs. Diabetic neuropathy .   Pain of right lower extremity Assessment & Plan:  I am going to schedule you for an EMG of your right foot for tarsal tunnel syndrome.  We will do the nerve conduction for the test first, and if you do not need to the needling portion, we will not do it.  We will schedule a follow-up to review everything after your EMG.  If we can get the pain in your foot under better control, we will recommend PT at that time.   Left tennis elbow Assessment & Plan:  For your left elbow, apply ice as needed and Voltaren gel up to 4 times daily to the elbow to reduce inflammation.  When doing  activities that exacerbate your pain, you can also look into getting a tennis elbow brace to wear.    Pain in both knees, unspecified chronicity Assessment & Plan:  I ordered x-rays of your bilateral knees.  Depending on those results, I may schedule you for Orthovisc injections given that steroid injections have increased her sugars too much in the past.  We will call you about those results and plans.   Orders: -     DG Knee 1-2 Views Left; Future -     DG Knee 1-2 Views Right; Future  Neurogenic pain of right foot Assessment & Plan: Continue duloxetine 30 mg capsules at nighttime.  Increase to 2 capsules at nighttime, and monitor for side effects such as worsening nightmares or insomnia.  If you experience these, drop back to 1 capsule at nighttime for at least 1 week, after that, you may either continue the medication or discontinue it if it is not benefiting your pain.  Please call the clinic and let me know if you have any other concerning side effects, or questions about this medication.  If you cannot tolerate this medication, I will look into a trial of Qutenza for you.

## 2022-08-22 NOTE — Progress Notes (Deleted)
  Sarah Garrison is a 42 y.o. year old female  who  has a past medical history of Anxiety, Asthma, Chronic constipation, Chronic shortness of breath, Depression, GERD (gastroesophageal reflux disease), History of COVID-19 (09/29/2019), History of seizures, Hypertension, Insulin dependent type 2 diabetes mellitus ([place]), Insulin pump in place, Mixed hyperlipidemia, OSA (obstructive sleep apnea), and Plantar fasciitis, right.   They are presenting to PM&R clinic for follow up related to r ankle/foot pain .Based on their presentation, their pain may be due to medial tarsal tunnel syndrome vs. CRPS type II vs. Diabetic neuropathy .  Plan from last visit:  Pain of right lower extremity Assessment & Plan: Etiology uncertain at this time. Exquisite non-dermatomal sensitivity post-surgery effecting medial R foot on both dorsal and plantar surfaces, and event into the ankle.   Patient initially refused EMG (prior bad experience with [facility] UE EMG) so MRI foot was ordered; however on chart review this was already done recently 02/2022 with findings of: IMPRESSION: 1. Findings consistent with moderate plantar fasciitis. No evidence of fascial rupture or significant surrounding soft tissue or marrow edema. 2. Mild midfoot degenerative changes without acute osseous findings. 3. The ankle tendons and ligaments appear unremarkable.   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 Will also discuss work accommodations for standing >30 minutes due to intense pain.    Cancelled MRI foot; Follow up in 1 month         Type 2 diabetes mellitus with diabetic neuropathy, unspecified whether long term insulin use (HCC) Assessment & Plan: Start Duloxetine 30 mg capsules. Take 1 capsule at nighttime for 1 week, then increase to 2 capsules.   Prior failed lyrica, gabapentin    May be candidate for Qutenza, will discuss at follow up     Interval Hx:  - Therapies: ***  - Follow ups: ***  - Falls:***  - DME:***  - Medications: ***  - Other concerns: ***

## 2022-08-23 ENCOUNTER — Other Ambulatory Visit: Payer: Self-pay

## 2022-08-24 ENCOUNTER — Ambulatory Visit: Payer: Medicaid Other | Attending: Family Medicine

## 2022-08-24 DIAGNOSIS — M7712 Lateral epicondylitis, left elbow: Secondary | ICD-10-CM | POA: Insufficient documentation

## 2022-08-24 DIAGNOSIS — Z3042 Encounter for surveillance of injectable contraceptive: Secondary | ICD-10-CM | POA: Diagnosis not present

## 2022-08-24 DIAGNOSIS — M792 Neuralgia and neuritis, unspecified: Secondary | ICD-10-CM | POA: Insufficient documentation

## 2022-08-24 DIAGNOSIS — M25561 Pain in right knee: Secondary | ICD-10-CM | POA: Insufficient documentation

## 2022-08-24 MED ORDER — MEDROXYPROGESTERONE ACETATE 150 MG/ML IM SUSP
150.0000 mg | Freq: Once | INTRAMUSCULAR | Status: AC
  Administered 2022-08-24: 150 mg via INTRAMUSCULAR

## 2022-08-24 NOTE — Assessment & Plan Note (Signed)
  For your left elbow, apply ice as needed and Voltaren gel up to 4 times daily to the elbow to reduce inflammation.  When doing activities that exacerbate your pain, you can also look into getting a tennis elbow brace to wear.

## 2022-08-24 NOTE — Assessment & Plan Note (Signed)
Continue duloxetine 30 mg capsules at nighttime.  Increase to 2 capsules at nighttime, and monitor for side effects such as worsening nightmares or insomnia.  If you experience these, drop back to 1 capsule at nighttime for at least 1 week, after that, you may either continue the medication or discontinue it if it is not benefiting your pain.  Please call the clinic and let me know if you have any other concerning side effects, or questions about this medication.  If you cannot tolerate this medication, I will look into a trial of Qutenza for you.

## 2022-08-24 NOTE — Assessment & Plan Note (Addendum)
  I am going to schedule you for an EMG of your right foot for tarsal tunnel syndrome.  We will do the nerve conduction for the test first, and if you do not need to the needling portion, we will not do it.  We will schedule a follow-up to review everything after your EMG.  If we can get the pain in your foot under better control, we will recommend PT at that time.

## 2022-08-24 NOTE — Assessment & Plan Note (Signed)
  I ordered x-rays of your bilateral knees.  Depending on those results, I may schedule you for Orthovisc injections given that steroid injections have increased her sugars too much in the past.  We will call you about those results and plans.

## 2022-08-24 NOTE — Progress Notes (Signed)
Pt was given depo injection in left ventragluteal. Pt tolerated shot well Urine HCG needed:yes Pt to return [id number]

## 2022-08-27 ENCOUNTER — Encounter: Payer: Medicaid Other | Admitting: Physical Medicine and Rehabilitation

## 2022-08-30 ENCOUNTER — Encounter: Payer: Self-pay | Admitting: Podiatry

## 2022-08-30 ENCOUNTER — Ambulatory Visit
Admission: RE | Admit: 2022-08-30 | Discharge: 2022-08-30 | Disposition: A | Discharge status: Home / Self Care | Payer: Medicaid Other | Source: Ambulatory Visit | Attending: Family Medicine | Admitting: Family Medicine

## 2022-08-30 ENCOUNTER — Ambulatory Visit: Payer: Medicaid Other | Admitting: Podiatry

## 2022-08-30 DIAGNOSIS — Z1231 Encounter for screening mammogram for malignant neoplasm of breast: Secondary | ICD-10-CM

## 2022-08-30 DIAGNOSIS — L600 Ingrowing nail: Secondary | ICD-10-CM

## 2022-08-30 NOTE — Progress Notes (Signed)
Subjective: Sarah Garrison is a 42 y.o.  female returns to office today for follow up evaluation after having right Hallux nail border nail ingrown removal with phenol and alcohol matrixectomy approximately 2 weeks ago. Patient has been soaking using epsom salts and applying topical antibiotic covered with bandaid daily. Patient denies fevers, chills, nausea, vomiting. Denies any calf pain, chest pain, SOB.   Objective:  Vitals: Reviewed  General: Well developed, nourished, in no acute distress, alert and oriented x3   Dermatology: Skin is warm, dry and supple bilateral. right hallux nail border appears to be clean, dry, with mild granular tissue and surrounding scab. There is no surrounding erythema, edema, drainage/purulence. The remaining nails appear unremarkable at this time. There are no other lesions or other signs of infection present.  Neurovascular status: Intact. No lower extremity swelling; No pain with calf compression bilateral.  Musculoskeletal: Decreased tenderness to palpation of the right hallux nail fold(s). Muscular strength within normal limits bilateral.   Assesement and Plan: S/p phenol and alcohol matrixectomy to the  right hallux nail medial border, doing well.   -Continue soaking in epsom salts twice a day followed by antibiotic ointment and a band-aid. Can leave uncovered at night. Continue this until completely healed.  -If the area has not healed in 2 weeks, call the office for follow-up appointment, or sooner if any problems arise.  -Monitor for any signs/symptoms of infection. Call the office immediately if any occur or go directly to the emergency room. Call with any questions/concerns.        Everitt Amber, [place] / [facility]                   08/30/2022

## 2022-09-06 ENCOUNTER — Ambulatory Visit (HOSPITAL_COMMUNITY)
Admission: EM | Admit: 2022-09-06 | Discharge: 2022-09-06 | Disposition: A | Discharge status: Home / Self Care | Payer: Medicaid Other | Attending: Emergency Medicine | Admitting: Emergency Medicine

## 2022-09-06 ENCOUNTER — Other Ambulatory Visit: Payer: Self-pay

## 2022-09-06 ENCOUNTER — Encounter (HOSPITAL_COMMUNITY): Payer: Self-pay | Admitting: *Deleted

## 2022-09-06 ENCOUNTER — Other Ambulatory Visit: Payer: Self-pay | Admitting: Family Medicine

## 2022-09-06 DIAGNOSIS — S29011A Strain of muscle and tendon of front wall of thorax, initial encounter: Secondary | ICD-10-CM

## 2022-09-06 MED ORDER — METHOCARBAMOL 500 MG PO TABS
500.0000 mg | ORAL_TABLET | Freq: Two times a day (BID) | ORAL | 0 refills | Status: DC
  Filled 2022-09-06: qty 20, 10d supply, fill #0

## 2022-09-06 MED ORDER — IBUPROFEN 800 MG PO TABS
800.0000 mg | ORAL_TABLET | Freq: Three times a day (TID) | ORAL | 0 refills | Status: DC
  Filled 2022-09-06: qty 21, 7d supply, fill #0

## 2022-09-06 MED ORDER — KETOROLAC TROMETHAMINE 60 MG/2ML IM SOLN
60.0000 mg | Freq: Once | INTRAMUSCULAR | Status: AC
  Administered 2022-09-06: 60 mg via INTRAMUSCULAR

## 2022-09-06 MED ORDER — KETOROLAC TROMETHAMINE 60 MG/2ML IM SOLN
INTRAMUSCULAR | Status: AC
  Filled 2022-09-06: qty 2

## 2022-09-06 MED ORDER — KETOROLAC TROMETHAMINE 30 MG/ML IJ SOLN
INTRAMUSCULAR | Status: AC
  Filled 2022-09-06: qty 1

## 2022-09-06 NOTE — ED Triage Notes (Signed)
Pt states she has upper back and right shoulder pain since last night. She has taken IBU without relief. She states no known injury.

## 2022-09-06 NOTE — ED Provider Notes (Signed)
[place]    CSN: [id number] Arrival date & time: 09/06/22  0913      History   Chief Complaint Chief Complaint  Patient presents with   Back Pain    HPI Sarah Garrison is a 42 y.o. female.   Patient reports she went to pick up her dog last night from out of a basket, she has a 5 pound show while block, as she was reaching down to pick her up with her right arm she noticed immediately she had right scapula and shoulder pain.  She also reports pulling pain with deep inspiration.  She did take some ibuprofen last night, does not have any more of this.  Reports she had trouble sleeping last night due to her pain, could not sleep on her right side.  No issues sleeping on her left side.  Denies chest pain, shortness of breath, recent illness, numbness, tingling or incontinence.  Denies any recent falls.  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   The history is provided by the patient.  Back Pain Associated symptoms: no abdominal pain, no chest pain, no dysuria, no headaches and no weakness     Past Medical History:  Diagnosis Date   Anxiety    Asthma    Chronic constipation    Chronic shortness of breath    Depression    GERD (gastroesophageal reflux disease)    History of COVID-19 09/29/2019   09-28-2020 result in epic; hospital admission 10-05-2019 for covid pneumonia with hypoxic acute respiratory failure, no oxygen needed when discharged 10-06-2019;  and positive 06-13-2020 result in epic, pt stated no symptoms   History of seizures    (05-04-2021  pt stated was told during hospital admission 05/ 2021 with covid , she had 2 seizures,  stated no medication and no seizure since)   Hypertension    Insulin dependent type 2 diabetes mellitus    endocrinologist-- Jacolyn Reedy NP;   pt uses insulin pump   Insulin pump in place    Mixed hyperlipidemia    OSA (obstructive sleep apnea)    (  05-04-2021 pt stated has not used cpap since 05/ 2021) followed by dr byrum;  study in epic 02-16-2019 moderate , titrated cpap 08-28-2019   Plantar fasciitis, right    w/ chronic pain    Patient Active Problem List   Diagnosis Date Noted   Left tennis elbow 08/24/2022   Pain in both knees 08/24/2022   Neurogenic pain of right foot 08/24/2022   Pain of right lower extremity 07/26/2022   Palpitations 07/31/2021   Fatigue 07/31/2021   Primary hypertension 07/31/2021   Hip pain 12/08/2019   Hyperlipidemia associated with type 2 diabetes mellitus 11/23/2019   History of COVID-19 10/27/2019   Stuttering 10/27/2019   Depression with anxiety 10/27/2019   Obesity, Class III, BMI 40-49.9 (morbid obesity) 06/30/2019   OSA (obstructive sleep apnea) 02/16/2019   Essential hypertension 06/22/2018   Type 2 diabetes mellitus with hyperosmolarity without coma, with long-term current use of insulin 12/13/2016   Asthma 07/30/2016   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/07/2016   DM neuropathy, type II diabetes mellitus 08/24/2015    Past Surgical History:  Procedure Laterality Date   CESAREAN SECTION  1999   PLANTAR FASCIA RELEASE Right 05/08/2021   Procedure: ENDOSCOPIC PLANTAR FASCIOTOMY;  Surgeon: Landis Martins, DPM;  Location: [facility];  Service: Podiatry;  Laterality: Right;  WITH BLOCK   STERIOD INJECTION Bilateral 05/08/2021   Procedure: PLATELET RICH PLASMA INJECTION HEELS;  Surgeon: Landis Martins, DPM;  Location: [facility];  Service: Podiatry;  Laterality: Bilateral;   TUBAL LIGATION Bilateral 09/29/2006   @[facility] ;   [facility]   VENTRAL HERNIA REPAIR N/A 11/27/2017   Procedure: [place];  Surgeon: Erroll Luna, MD;  Location: [facility];  Service: General;  Laterality: N/A;   WISDOM TOOTH EXTRACTION      OB History     Gravida  5   Para  4   Term      Preterm      AB  1   Living  4      SAB  1   IAB       Ectopic      Multiple      Live Births               Home Medications    Prior to Admission medications   Medication Sig Start Date End Date Taking? Authorizing Provider  atorvastatin (LIPITOR) 40 MG tablet Take 1 tablet (40 mg total) by mouth daily. 05/31/22  Yes Newlin, Charlane Ferretti, MD  Continuous Blood Gluc Sensor (DEXCOM G6 SENSOR) MISC USE AND CHANGE SENSOR EVERY 10 DAYS AS DIRECTED 03/14/22  Yes [provider]  Continuous Blood Gluc Sensor (DEXCOM G6 SENSOR) MISC SMARTSIG:Topical Every 10 Days   Yes [provider]  Continuous Blood Gluc Transmit (DEXCOM G6 TRANSMITTER) MISC USE TO CHECK BLOOD SUGAR EVERY DAY. CHANGE EVERY 3 MONTHS AS DIRECTED 03/14/22  Yes [provider]  DULoxetine (CYMBALTA) 30 MG capsule take 1 capsule by mouth at bedtime x 1 week, then increase to 2 capsules at nighttime. (Call clinic if any side effects or concerns) 07/25/22  Yes Tressa Busman, Lilia Pro C, DO  fluticasone (FLONASE) 50 MCG/ACT nasal spray Place 1 spray into both nostrils daily as needed for allergies or rhinitis. 08/17/22  Yes Hermanns, Ashlee P, PA-C  ibuprofen (ADVIL) 800 MG tablet Take 1 tablet (800 mg total) by mouth 3 (three) times daily. 09/06/22  Yes Louretta Shorten, [place] N, FNP  insulin aspart (NOVOLOG FLEXPEN) 100 UNIT/ML FlexPen Inject into the skin 3 (three) times daily before meals. ONLY WHEN INSULIN PUMP MALFUNCTION'S PER ENDOCRINOLOGY DIRECTION   Yes [provider]  insulin aspart (NOVOLOG) 100 UNIT/ML injection USE VIA INSULIN PUMP. MAX TOTAL DAILY DOSE OF 200 UNITS 11/13/21  Yes   Insulin Human (INSULIN PUMP) SOLN Inject 1 each into the skin See admin instructions. Medication: Novolog 100 units/ml injection. Takes 100 units via pump per patient.   Yes [provider]  Insulin Syringe-Needle U-100 31G X 5/16" 1 ML MISC inject 10 units into the skin 3 times daily. use to inject novolog based on insulin to card ratio- max daily dose 150 units 09/23/20  Yes    levocetirizine (XYZAL ALLERGY 24HR) 5 MG tablet Take 1 tablet (5 mg total) by mouth every evening. 03/16/22  Yes Jaynee Eagles, PA-C  methocarbamol (ROBAXIN) 500 MG tablet Take 1 tablet (500 mg total) by mouth 2 (two) times daily. 09/06/22  Yes Erinn Huskins, [place] N, FNP  OZEMPIC, 0.25 OR 0.5 MG/DOSE, 2 MG/3ML SOPN Inject into the skin. 06/13/22  Yes [provider]  ammonium lactate (AMLACTIN) 12 % lotion Apply 1 Application topically as needed for dry skin. 04/13/22   Felipa Furnace, DPM  insulin aspart (NOVOLOG) 100 UNIT/ML injection USE VIA INSULIN PUMP. MAX TOTAL  DAILY DOSE OF 250 UNITS 07/30/22     ARIPiprazole (ABILIFY) 5 MG tablet Take 1 tablet (5 mg total) by mouth daily. 10/27/19 06/13/20  Sater, Nanine Means, MD    Family History Family History  Problem Relation Age of Onset   Asthma Mother    Kidney failure Mother    Brain cancer Mother    Asthma Father    Other Father        surgery on stomach but don't know from what   Breast cancer Maternal Grandmother    Diabetes Maternal Grandmother    Diabetes Brother     Social History Social History   Tobacco Use   Smoking status: Former    Packs/day: 0.10    Years: 2.00    Additional pack years: 0.00    Total pack years: 0.20    Types: Cigarettes    Quit date: 11/27/2017    Years since quitting: 4.7   Smokeless tobacco: Never  Vaping Use   Vaping Use: Some days   Devices: Elfvar  (05-04-2021 per pt last vaped approx 2 wks ago)  Substance Use Topics   Alcohol use: No   Drug use: Never     Allergies   Morphine and related, Glyburide, Hydrocodone, Ivp dye [iodinated contrast media], and Oxycodone   Review of Systems Review of Systems  Constitutional:  Negative for chills, diaphoresis and fatigue.  HENT:  Negative for sore throat.   Eyes:  Negative for discharge.  Respiratory:  Negative for cough and shortness of breath.   Cardiovascular:  Negative for chest pain.  Gastrointestinal:  Negative for abdominal pain.   Genitourinary:  Negative for dysuria and flank pain.  Musculoskeletal:  Positive for back pain. Negative for gait problem, joint swelling, neck pain and neck stiffness.  Neurological:  Negative for tremors, syncope, weakness and headaches.     Physical Exam Triage Vital Signs ED Triage Vitals  Enc Vitals Group     BP 09/06/22 0924 (!) 171/102     Pulse Rate 09/06/22 0924 (!) 102     Resp 09/06/22 0924 18     Temp 09/06/22 0924 98.2 F (36.8 C)     Temp Source 09/06/22 0924 Oral     SpO2 09/06/22 0924 96 %     Weight --      Height --      Head Circumference --      Peak Flow --      Pain Score 09/06/22 0921 10     Pain Loc --      Pain Edu? --      Excl. in [place]? --    No data found.  Updated Vital Signs BP (!) 171/102 (BP Location: Left Arm)   Pulse (!) 102   Temp 98.2 F (36.8 C) (Oral)   Resp 18   SpO2 96%   Visual Acuity Right Eye Distance:   Left Eye Distance:   Bilateral Distance:    Right Eye Near:   Left Eye Near:    Bilateral Near:     Physical Exam Vitals and nursing note reviewed.  Constitutional:      General: She is not in acute distress.    Appearance: She is well-developed.  HENT:     Head: Normocephalic and atraumatic.     Right Ear: External ear normal.     Left Ear: External ear normal.     Nose: Nose normal.     Mouth/Throat:     Mouth: Mucous membranes  are moist.  Eyes:     General:        Right eye: No discharge.        Left eye: No discharge.     Conjunctiva/sclera: Conjunctivae normal.  Cardiovascular:     Rate and Rhythm: Normal rate and regular rhythm.     Heart sounds: Normal heart sounds, S1 normal and S2 normal. No murmur heard. Pulmonary:     Effort: Pulmonary effort is normal. No respiratory distress.     Breath sounds: Normal breath sounds.     Comments: Lungs vesicular posteriorly. Musculoskeletal:        General: Tenderness present. No swelling, deformity or signs of injury. Normal range of motion.     Cervical  back: Normal, normal range of motion and neck supple. No rigidity or tenderness.     Thoracic back: Tenderness present.     Lumbar back: Normal.       Back:     Right lower leg: No edema.     Left lower leg: No edema.     Comments: Point tenderness to right scapular area on palpation.  Full range of motion with right shoulder, strength 5 out of 5 in upper extremities. Point tenderness to anterior third rib, reproducible to deep inspiration.   Skin:    General: Skin is warm and dry.     Capillary Refill: Capillary refill takes less than 2 seconds.  Neurological:     Mental Status: She is alert and oriented to person, place, and time.  Psychiatric:        Mood and Affect: Mood normal.        Behavior: Behavior is cooperative.      UC Treatments / Results  Labs (all labs ordered are listed, but only abnormal results are displayed) Labs Reviewed - No data to display  EKG   Radiology No results found.  Procedures Procedures (including critical care time)  Medications Ordered in UC Medications  ketorolac (TORADOL) injection 60 mg (has no administration in time range)    Initial Impression / Assessment and Plan / UC Course  I have reviewed the triage vital signs and the nursing notes.  Pertinent labs & imaging results that were available during my care of the patient were reviewed by me and considered in my medical decision making (see chart for details).  Vitals in triage reviewed, patient is hemodynamically stable.  Point tenderness to right posterior scapula, and right anterior third rib area, pain reproduced with deep inspiration.  Suspect chest wall musculoskeletal strain.  Low concern for cardiac etiology, without radiation, numbness, chest pain, or shortness of breath.  Pain reproducible, tied to direct injury of lifting dog.  Advised muscle relaxers and anti-inflammatories with rest, appropriate emergent precautions and follow-up care discussed, patient verbalized  understanding.  No questions at this time.     Final Clinical Impressions(s) / UC Diagnoses   Final diagnoses:  Chest wall muscle strain, initial encounter     Discharge Instructions      Your symptoms are consistent with a musculoskeletal strain.  We have given you a dose of Toradol in clinic today to help with your pain.  Please do not take the ibuprofen for 12 hours as they are both.  You can take the muscle relaxer twice daily as needed for pain.  Do not drink or drive on this medication as it may make you drowsy.  You can apply heat or ice to the area, I suggest resting and taking  this weekend off to recover.  Please follow-up with your primary care for further evaluation if your pain does not improve over the next week.  Your blood pressure was also elevated today in clinic, please follow-up with your primary care provider regarding this.  Please read to clinic or seek immediate care if you develop numbness, tingling, shortness of breath, chest pain, syncope, loss of bowel or bladder function, or any worsening symptoms.      ED Prescriptions     Medication Sig Dispense Auth. Provider   methocarbamol (ROBAXIN) 500 MG tablet Take 1 tablet (500 mg total) by mouth 2 (two) times daily. 20 tablet Louretta Shorten, [place] N, [place]   ibuprofen (ADVIL) 800 MG tablet Take 1 tablet (800 mg total) by mouth 3 (three) times daily. 21 tablet Gay Rape, [place] N, [place]      PDMP not reviewed this encounter.   Louretta Shorten [place] N, [place] 09/06/22 [phone]

## 2022-09-06 NOTE — Discharge Instructions (Addendum)
Your symptoms are consistent with a musculoskeletal strain.  We have given you a dose of Toradol in clinic today to help with your pain.  Please do not take the ibuprofen for 12 hours as they are both.  You can take the muscle relaxer twice daily as needed for pain.  Do not drink or drive on this medication as it may make you drowsy.  You can apply heat or ice to the area, I suggest resting and taking this weekend off to recover.  Please follow-up with your primary care for further evaluation if your pain does not improve over the next week.  Your blood pressure was also elevated today in clinic, please follow-up with your primary care provider regarding this.  Please read to clinic or seek immediate care if you develop numbness, tingling, shortness of breath, chest pain, syncope, loss of bowel or bladder function, or any worsening symptoms.

## 2022-09-27 ENCOUNTER — Encounter (HOSPITAL_COMMUNITY): Payer: Self-pay

## 2022-09-27 ENCOUNTER — Ambulatory Visit (HOSPITAL_COMMUNITY)
Admission: EM | Admit: 2022-09-27 | Discharge: 2022-09-27 | Disposition: A | Discharge status: Home / Self Care | Payer: Medicaid Other | Attending: Emergency Medicine | Admitting: Emergency Medicine

## 2022-09-27 ENCOUNTER — Other Ambulatory Visit: Payer: Self-pay

## 2022-09-27 DIAGNOSIS — J302 Other seasonal allergic rhinitis: Secondary | ICD-10-CM

## 2022-09-27 DIAGNOSIS — H66001 Acute suppurative otitis media without spontaneous rupture of ear drum, right ear: Secondary | ICD-10-CM

## 2022-09-27 MED ORDER — CETIRIZINE HCL 10 MG PO TABS
10.0000 mg | ORAL_TABLET | Freq: Every day | ORAL | 2 refills | Status: DC
  Filled 2022-09-27: qty 30, 30d supply, fill #0

## 2022-09-27 MED ORDER — OLOPATADINE HCL 0.1 % OP SOLN
1.0000 [drp] | Freq: Two times a day (BID) | OPHTHALMIC | 0 refills | Status: DC
  Filled 2022-09-27: qty 5, 25d supply, fill #0

## 2022-09-27 MED ORDER — FLUTICASONE PROPIONATE 50 MCG/ACT NA SUSP
2.0000 | Freq: Every day | NASAL | 2 refills | Status: DC
  Filled 2022-09-27: qty 16, 30d supply, fill #0

## 2022-09-27 MED ORDER — AMOXICILLIN 500 MG PO CAPS
500.0000 mg | ORAL_CAPSULE | Freq: Two times a day (BID) | ORAL | 0 refills | Status: DC
  Filled 2022-09-27: qty 10, 5d supply, fill #0

## 2022-09-27 MED ORDER — FLUCONAZOLE 150 MG PO TABS
150.0000 mg | ORAL_TABLET | Freq: Once | ORAL | 0 refills | Status: DC | PRN
  Filled 2022-09-27: qty 2, 3d supply, fill #0

## 2022-09-27 NOTE — Discharge Instructions (Addendum)
For your allergies I recommend using the eyedrops twice daily. Also take once daily Zyrtec.  Using the nasal spray once daily can also help and will reduce ear pressure/pain  For the ear infection, take the antibiotic twice daily for the next 5 days.  Make sure you finish the full course; you should not have any pills left over. Take with food to avoid upset stomach.  Take the fluconazole pill today, and the second pill in 3 days for yeast infection.

## 2022-09-27 NOTE — ED Provider Notes (Signed)
[facility]    CSN: [id number] Arrival date & time: 09/27/22  4098     History   Chief Complaint Chief Complaint  Patient presents with   Otalgia    HPI Sarah Garrison is a 42 y.o. female.  3-day history of of right ear pain, 5/10.  Denies any drainage or hearing changes.  Has sensation of "something jumping" in the ear No fever, runny nose, congestion, cough  Also reporting watery itchy eyes. Would like something for seasonal allergies   Past Medical History:  Diagnosis Date   Anxiety    Asthma    Chronic constipation    Chronic shortness of breath    Depression    GERD (gastroesophageal reflux disease)    History of COVID-19 09/29/2019   09-28-2020 result in epic; hospital admission 10-05-2019 for covid pneumonia with hypoxic acute respiratory failure, no oxygen needed when discharged 10-06-2019;  and positive 06-13-2020 result in epic, pt stated no symptoms   History of seizures    (05-04-2021  pt stated was told during hospital admission 05/ 2021 with covid , she had 2 seizures,  stated no medication and no seizure since)   Hypertension    Insulin dependent type 2 diabetes mellitus    endocrinologist-- Fredia Sorrow NP;   pt uses insulin pump   Insulin pump in place    Mixed hyperlipidemia    OSA (obstructive sleep apnea)    ( 05-04-2021 pt stated has not used cpap since 05/ 2021) followed by dr byrum;  study in epic 02-16-2019 moderate , titrated cpap 08-28-2019   Plantar fasciitis, right    w/ chronic pain    Patient Active Problem List   Diagnosis Date Noted   Left tennis elbow 08/24/2022   Pain in both knees 08/24/2022   Neurogenic pain of right foot 08/24/2022   Pain of right lower extremity 07/26/2022   Palpitations 07/31/2021   Fatigue 07/31/2021   Primary hypertension 07/31/2021   Hip pain 12/08/2019   Hyperlipidemia associated with type 2 diabetes mellitus 11/23/2019   History of COVID-19 10/27/2019   Stuttering 10/27/2019    Depression with anxiety 10/27/2019   Obesity, Class III, BMI 40-49.9 (morbid obesity) 06/30/2019   OSA (obstructive sleep apnea) 02/16/2019   Essential hypertension 06/22/2018   Type 2 diabetes mellitus with hyperosmolarity without coma, with long-term current use of insulin 12/13/2016   Asthma 07/30/2016   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/07/2016   DM neuropathy, type II diabetes mellitus 08/24/2015    Past Surgical History:  Procedure Laterality Date   CESAREAN SECTION  1999   PLANTAR FASCIA RELEASE Right 05/08/2021   Procedure: ENDOSCOPIC PLANTAR FASCIOTOMY;  Surgeon: Asencion Islam, DPM;  Location: [facility];  Service: Podiatry;  Laterality: Right;  WITH BLOCK   STERIOD INJECTION Bilateral 05/08/2021   Procedure: PLATELET RICH PLASMA INJECTION HEELS;  Surgeon: Asencion Islam, DPM;  Location:  [facility];  Service: Podiatry;  Laterality: Bilateral;   TUBAL LIGATION Bilateral 09/29/2006   @[facility] ;   [facility]   VENTRAL HERNIA REPAIR N/A 11/27/2017   Procedure: VENTRAL HERNIA REPAIR ERAS PATHWAY;  Surgeon: Harriette Bouillon, MD;  Location: [facility];  Service: General;  Laterality: N/A;   WISDOM TOOTH EXTRACTION      OB History     Gravida  5   Para  4   Term      Preterm      AB  1   Living  4  SAB  1   IAB      Ectopic      Multiple      Live Births               Home Medications    Prior to Admission medications   Medication Sig Start Date End Date Taking? Authorizing Provider  amoxicillin (AMOXIL) 500 MG capsule Take 1 capsule (500 mg total) by mouth 2 (two) times daily for 5 days. 09/27/22 10/02/22 Yes Diem Pagnotta, Lurena Joiner, PA-C  cetirizine (ZYRTEC ALLERGY) 10 MG tablet Take 1 tablet (10 mg total) by mouth daily. 09/27/22  Yes Demba Nigh, Lurena Joiner, PA-C  fluconazole (DIFLUCAN) 150 MG tablet Take 1 tablet (150 mg total) by mouth once as needed for up to 2 doses (take one pill on day 1, and the second pill 3 days later). 09/27/22  Yes  Davarious Tumbleson, Lurena Joiner, PA-C  fluticasone (FLONASE) 50 MCG/ACT nasal spray Place 2 sprays into both nostrils daily. 09/27/22  Yes Mikella Linsley, Lurena Joiner, PA-C  olopatadine (PATANOL) 0.1 % ophthalmic solution Place 1 drop into both eyes 2 (two) times daily. 09/27/22  Yes Naturi Alarid, Lurena Joiner, PA-C  atorvastatin (LIPITOR) 40 MG tablet Take 1 tablet (40 mg total) by mouth daily. 05/31/22   Hoy Register, MD  Continuous Blood Gluc Sensor (DEXCOM G6 SENSOR) MISC USE AND CHANGE SENSOR EVERY 10 DAYS AS DIRECTED 03/14/22   [provider]  Continuous Blood Gluc Sensor (DEXCOM G6 SENSOR) MISC SMARTSIG:Topical Every 10 Days    [provider]  Continuous Blood Gluc Transmit (DEXCOM G6 TRANSMITTER) MISC USE TO CHECK BLOOD SUGAR EVERY DAY. CHANGE EVERY 3 MONTHS AS DIRECTED 03/14/22   [provider]  insulin aspart (NOVOLOG FLEXPEN) 100 UNIT/ML FlexPen Inject into the skin 3 (three) times daily before meals. ONLY WHEN INSULIN PUMP MALFUNCTION'S PER ENDOCRINOLOGY DIRECTION    [provider]  insulin aspart (NOVOLOG) 100 UNIT/ML injection USE VIA INSULIN PUMP. MAX TOTAL DAILY DOSE OF 200 UNITS 11/13/21     insulin aspart (NOVOLOG) 100 UNIT/ML injection USE VIA INSULIN PUMP. MAX TOTAL DAILY DOSE OF 250 UNITS 07/30/22     Insulin Human (INSULIN PUMP) SOLN Inject 1 each into the skin See admin instructions. Medication: Novolog 100 units/ml injection. Takes 100 units via pump per patient.    [provider]  Insulin Syringe-Needle U-100 31G X 5/16" 1 ML MISC inject 10 units into the skin 3 times daily. use to inject novolog based on insulin to card ratio- max daily dose 150 units 09/23/20     OZEMPIC, 0.25 OR 0.5 MG/DOSE, 2 MG/3ML SOPN Inject into the skin. 06/13/22   [provider]  ARIPiprazole (ABILIFY) 5 MG tablet Take 1 tablet (5 mg total) by mouth daily. 10/27/19 06/13/20  Sater, Pearletha Furl, MD    Family History Family History  Problem Relation Age of Onset   Asthma Mother     Kidney failure Mother    Brain cancer Mother    Asthma Father    Other Father        surgery on stomach but don't know from what   Breast cancer Maternal Grandmother    Diabetes Maternal Grandmother    Diabetes Brother     Social History Social History   Tobacco Use   Smoking status: Former    Packs/day: 0.10    Years: 2.00    Additional pack years: 0.00    Total pack years: 0.20    Types: Cigarettes    Quit date: 11/27/2017  Years since quitting: 4.8   Smokeless tobacco: Never  Vaping Use   Vaping Use: Some days   Devices: Elfvar  (05-04-2021 per pt last vaped approx 2 wks ago)  Substance Use Topics   Alcohol use: No   Drug use: Never     Allergies   Morphine and related, Glyburide, Hydrocodone, Ivp dye [iodinated contrast media], and Oxycodone   Review of Systems Review of Systems  HENT:  Positive for ear pain.    As per HPI  Physical Exam Triage Vital Signs ED Triage Vitals [09/27/22 0919]  Enc Vitals Group     BP (!) 143/85     Pulse Rate (!) 103     Resp 16     Temp 98.1 F (36.7 C)     Temp Source Oral     SpO2 95 %     Weight      Height      Head Circumference      Peak Flow      Pain Score 5     Pain Loc      Pain Edu?      Excl. in GC?    No data found.  Updated Vital Signs BP (!) 143/85 (BP Location: Right Arm)   Pulse (!) 103   Temp 98.1 F (36.7 C) (Oral)   Resp 16   SpO2 95%    Physical Exam Vitals and nursing note reviewed.  Constitutional:      General: She is not in acute distress.    Appearance: Normal appearance. She is not ill-appearing.  HENT:     Right Ear: A middle ear effusion is present.     Left Ear: Tympanic membrane is injected.     Ears:     Comments: Right TM cloudy/white. Left TM with surrounding erythema     Nose: No congestion or rhinorrhea.     Mouth/Throat:     Pharynx: Oropharynx is clear. No posterior oropharyngeal erythema.  Eyes:     General:        Right eye: No discharge.        Left  eye: No discharge.     Extraocular Movements: Extraocular movements intact.     Conjunctiva/sclera: Conjunctivae normal.     Pupils: Pupils are equal, round, and reactive to light.  Cardiovascular:     Rate and Rhythm: Normal rate and regular rhythm.     Pulses: Normal pulses.     Heart sounds: Normal heart sounds.  Pulmonary:     Effort: Pulmonary effort is normal.     Breath sounds: Normal breath sounds.  Musculoskeletal:     Cervical back: Normal range of motion.  Skin:    General: Skin is warm and dry.  Neurological:     Mental Status: She is alert and oriented to person, place, and time.     UC Treatments / Results  Labs (all labs ordered are listed, but only abnormal results are displayed) Labs Reviewed - No data to display  EKG  Radiology No results found.  Procedures Procedures (including critical care time)  Medications Ordered in UC Medications - No data to display  Initial Impression / Assessment and Plan / UC Course  I have reviewed the triage vital signs and the nursing notes.  Pertinent labs & imaging results that were available during my care of the patient were reviewed by me and considered in my medical decision making (see chart for details).  Right otitis media Amoxicillin twice  daily for 5 days.  Patient with history of antibiotic associated yeast infection, will cover with fluconazole 2 dose.  For seasonal allergies recommend starting once daily Zyrtec, Flonase.  Also sent olopatadine allergy drops for eyes per patient request.  Final Clinical Impressions(s) / UC Diagnoses   Final diagnoses:  Non-recurrent acute suppurative otitis media of right ear without spontaneous rupture of tympanic membrane  Seasonal allergies     Discharge Instructions      For your allergies I recommend using the eyedrops twice daily. Also take once daily Zyrtec.  Using the nasal spray once daily can also help and will reduce ear pressure/pain  For the ear  infection, take the antibiotic twice daily for the next 5 days.  Make sure you finish the full course; you should not have any pills left over. Take with food to avoid upset stomach.  Take the fluconazole pill today, and the second pill in 3 days for yeast infection.      ED Prescriptions     Medication Sig Dispense Auth. Provider   cetirizine (ZYRTEC ALLERGY) 10 MG tablet Take 1 tablet (10 mg total) by mouth daily. 30 tablet Jaylynne Birkhead, PA-C   fluticasone (FLONASE) 50 MCG/ACT nasal spray Place 2 sprays into both nostrils daily. 16 g Analiya Porco, PA-C   olopatadine (PATANOL) 0.1 % ophthalmic solution Place 1 drop into both eyes 2 (two) times daily. 5 mL Spence Soberano, PA-C   amoxicillin (AMOXIL) 500 MG capsule Take 1 capsule (500 mg total) by mouth 2 (two) times daily for 5 days. 10 capsule Daymon Hora, PA-C   fluconazole (DIFLUCAN) 150 MG tablet Take 1 tablet (150 mg total) by mouth once as needed for up to 2 doses (take one pill on day 1, and the second pill 3 days later). 2 tablet Zondra Lawlor, Lurena Joiner, PA-C      PDMP not reviewed this encounter.   Chantale Leugers, Lurena Joiner, [place] 09/27/22 1010

## 2022-09-27 NOTE — ED Triage Notes (Signed)
Patient c/o right ear pain x 3 days. Patient states, "I fel painand it feels like something is jumping in there."

## 2022-10-01 ENCOUNTER — Other Ambulatory Visit: Payer: Self-pay

## 2022-10-01 ENCOUNTER — Encounter (HOSPITAL_COMMUNITY): Payer: Self-pay | Admitting: Emergency Medicine

## 2022-10-01 ENCOUNTER — Ambulatory Visit (HOSPITAL_COMMUNITY)
Admission: EM | Admit: 2022-10-01 | Discharge: 2022-10-01 | Disposition: A | Discharge status: Home / Self Care | Payer: Medicaid Other | Attending: Family Medicine | Admitting: Family Medicine

## 2022-10-01 DIAGNOSIS — H60311 Diffuse otitis externa, right ear: Secondary | ICD-10-CM

## 2022-10-01 MED ORDER — CIPROFLOXACIN-DEXAMETHASONE 0.3-0.1 % OT SUSP
4.0000 [drp] | Freq: Two times a day (BID) | OTIC | 0 refills | Status: AC
  Filled 2022-10-01: qty 7.5, 19d supply, fill #0

## 2022-10-01 MED ORDER — LEVOFLOXACIN 500 MG PO TABS
500.0000 mg | ORAL_TABLET | Freq: Every day | ORAL | 0 refills | Status: AC
  Filled 2022-10-01: qty 7, 7d supply, fill #0

## 2022-10-01 NOTE — ED Provider Notes (Signed)
[facility]    CSN: [id number] Arrival date & time: 10/01/22  0957      History   Chief Complaint Chief Complaint  Patient presents with   Otalgia    HPI DEAIRRA HALLECK is a 42 y.o. female.    Otalgia  Here for continued and worsening right ear pain. Seen here 4/25 and rx'd amoxil for OM. Now pain is radiating to right jaw. No f/c. No new cough.  Has h/o DM  Has made appt with ENT for end of May. States several yrs ago some drops they rx'd for her made all the difference.    Past Medical History:  Diagnosis Date   Anxiety    Asthma    Chronic constipation    Chronic shortness of breath    Depression    GERD (gastroesophageal reflux disease)    History of COVID-19 09/29/2019   09-28-2020 result in epic; hospital admission 10-05-2019 for covid pneumonia with hypoxic acute respiratory failure, no oxygen needed when discharged 10-06-2019;  and positive 06-13-2020 result in epic, pt stated no symptoms   History of seizures    (05-04-2021  pt stated was told during hospital admission 05/ 2021 with covid , she had 2 seizures,  stated no medication and no seizure since)   Hypertension    Insulin dependent type 2 diabetes mellitus (HCC)    endocrinologist-- Fredia Sorrow NP;   pt uses insulin pump   Insulin pump in place    Mixed hyperlipidemia    OSA (obstructive sleep apnea)    ( 05-04-2021 pt stated has not used cpap since 05/ 2021) followed by dr byrum;  study in epic 02-16-2019 moderate , titrated cpap 08-28-2019   Plantar fasciitis, right    w/ chronic pain    Patient Active Problem List   Diagnosis Date Noted   Left tennis elbow 08/24/2022   Pain in both knees 08/24/2022   Neurogenic pain of right foot 08/24/2022   Pain of right lower extremity 07/26/2022   Palpitations 07/31/2021   Fatigue 07/31/2021   Primary hypertension 07/31/2021   Hip pain 12/08/2019   Hyperlipidemia associated with type 2 diabetes mellitus (HCC) 11/23/2019   History  of COVID-19 10/27/2019   Stuttering 10/27/2019   Depression with anxiety 10/27/2019   Obesity, Class III, BMI 40-49.9 (morbid obesity) (HCC) 06/30/2019   OSA (obstructive sleep apnea) 02/16/2019   Essential hypertension 06/22/2018   Type 2 diabetes mellitus with hyperosmolarity without coma, with long-term current use of insulin (HCC) 12/13/2016   Asthma 07/30/2016   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/07/2016   DM neuropathy, type II diabetes mellitus (HCC) 08/24/2015    Past Surgical History:  Procedure Laterality Date   CESAREAN SECTION  1999   PLANTAR FASCIA RELEASE Right 05/08/2021   Procedure: ENDOSCOPIC PLANTAR FASCIOTOMY;  Surgeon: Asencion Islam, DPM;  Location: [facility];  Service: Podiatry;  Laterality: Right;  WITH BLOCK   STERIOD INJECTION Bilateral 05/08/2021   Procedure: PLATELET RICH PLASMA INJECTION HEELS;  Surgeon: Asencion Islam, DPM;  Location: [facility];  Service: Podiatry;  Laterality: Bilateral;   TUBAL LIGATION Bilateral 09/29/2006   @[facility] ;   [facility]   VENTRAL HERNIA REPAIR N/A 11/27/2017   Procedure: VENTRAL HERNIA REPAIR ERAS PATHWAY;  Surgeon: Harriette Bouillon, MD;  Location: [facility];  Service: General;  Laterality: N/A;   WISDOM TOOTH EXTRACTION      OB History     Gravida  5   Para  4  Term      Preterm      AB  1   Living  4      SAB  1   IAB      Ectopic      Multiple      Live Births               Home Medications    Prior to Admission medications   Medication Sig Start Date End Date Taking? Authorizing Provider  ciprofloxacin-dexamethasone (CIPRODEX) OTIC suspension Place 4 drops into the right ear 2 (two) times daily for 7 days. 10/01/22 10/08/22 Yes Carmyn Hamm, Janace Aris, MD  levofloxacin (LEVAQUIN) 500 MG tablet Take 1 tablet (500 mg total) by mouth daily for 7 days. 10/01/22 10/08/22 Yes Zenia Resides, MD  atorvastatin (LIPITOR) 40 MG tablet Take 1 tablet (40 mg total) by mouth daily.  05/31/22   Hoy Register, MD  cetirizine (ZYRTEC ALLERGY) 10 MG tablet Take 1 tablet (10 mg total) by mouth daily. 09/27/22   Rising, Lurena Joiner, PA-C  Continuous Blood Gluc Sensor (DEXCOM G6 SENSOR) MISC USE AND CHANGE SENSOR EVERY 10 DAYS AS DIRECTED 03/14/22   [provider]  Continuous Blood Gluc Sensor (DEXCOM G6 SENSOR) MISC SMARTSIG:Topical Every 10 Days    [provider]  Continuous Blood Gluc Transmit (DEXCOM G6 TRANSMITTER) MISC USE TO CHECK BLOOD SUGAR EVERY DAY. CHANGE EVERY 3 MONTHS AS DIRECTED 03/14/22   [provider]  fluconazole (DIFLUCAN) 150 MG tablet Take 1 tablet (150 mg total) by mouth once as needed for up to 2 doses (take one pill on day 1, and the second pill 3 days later). 09/27/22   Rising, Lurena Joiner, PA-C  fluticasone (FLONASE) 50 MCG/ACT nasal spray Place 2 sprays into both nostrils daily. 09/27/22   Rising, Lurena Joiner, PA-C  insulin aspart (NOVOLOG FLEXPEN) 100 UNIT/ML FlexPen Inject into the skin 3 (three) times daily before meals. ONLY WHEN INSULIN PUMP MALFUNCTION'S PER ENDOCRINOLOGY DIRECTION    [provider]  insulin aspart (NOVOLOG) 100 UNIT/ML injection USE VIA INSULIN PUMP. MAX TOTAL DAILY DOSE OF 200 UNITS 11/13/21     insulin aspart (NOVOLOG) 100 UNIT/ML injection USE VIA INSULIN PUMP. MAX TOTAL DAILY DOSE OF 250 UNITS 07/30/22     Insulin Human (INSULIN PUMP) SOLN Inject 1 each into the skin See admin instructions. Medication: Novolog 100 units/ml injection. Takes 100 units via pump per patient.    [provider]  Insulin Syringe-Needle U-100 31G X 5/16" 1 ML MISC inject 10 units into the skin 3 times daily. use to inject novolog based on insulin to card ratio- max daily dose 150 units 09/23/20     olopatadine (PATANOL) 0.1 % ophthalmic solution Place 1 drop into both eyes 2 (two) times daily. 09/27/22   Rising, Rebecca, PA-C  OZEMPIC, 0.25 OR 0.5 MG/DOSE, 2 MG/3ML SOPN Inject into the skin. 06/13/22   [provider]   ARIPiprazole (ABILIFY) 5 MG tablet Take 1 tablet (5 mg total) by mouth daily. 10/27/19 06/13/20  Sater, Pearletha Furl, MD    Family History Family History  Problem Relation Age of Onset   Asthma Mother    Kidney failure Mother    Brain cancer Mother    Asthma Father    Other Father        surgery on stomach but don't know from what   Breast cancer Maternal Grandmother    Diabetes Maternal Grandmother    Diabetes Brother     Social History  Social History   Tobacco Use   Smoking status: Former    Packs/day: 0.10    Years: 2.00    Additional pack years: 0.00    Total pack years: 0.20    Types: Cigarettes    Quit date: 11/27/2017    Years since quitting: 4.8   Smokeless tobacco: Never  Vaping Use   Vaping Use: Some days   Devices: Elfvar  (05-04-2021 per pt last vaped approx 2 wks ago)  Substance Use Topics   Alcohol use: No   Drug use: Never     Allergies   Morphine and related, Glyburide, Hydrocodone, Ivp dye [iodinated contrast media], and Oxycodone   Review of Systems Review of Systems  HENT:  Positive for ear pain.      Physical Exam Triage Vital Signs ED Triage Vitals  Enc Vitals Group     BP 10/01/22 1209 (!) 140/101     Pulse Rate 10/01/22 1209 (!) 106     Resp 10/01/22 1209 20     Temp 10/01/22 1209 98.4 F (36.9 C)     Temp Source 10/01/22 1209 Oral     SpO2 10/01/22 1209 95 %     Weight --      Height --      Head Circumference --      Peak Flow --      Pain Score 10/01/22 1206 8     Pain Loc --      Pain Edu? --      Excl. in GC? --    No data found.  Updated Vital Signs BP (!) 140/101 (BP Location: Left Arm)   Pulse (!) 106   Temp 98.4 F (36.9 C) (Oral)   Resp 20   SpO2 95%   Visual Acuity Right Eye Distance:   Left Eye Distance:   Bilateral Distance:    Right Eye Near:   Left Eye Near:    Bilateral Near:     Physical Exam Vitals reviewed.  Constitutional:      General: She is not in acute distress.    Appearance: She is  not toxic-appearing.  HENT:     Left Ear: Tympanic membrane and ear canal normal.     Ears:     Comments: Right TM is a little injected inferiorly. TM shiny and translucent at present. She is tender over her tragus and preauricular area. Has a little swelling possibly at the preauricular area. No erythema or induration    Nose: Nose normal.     Mouth/Throat:     Mouth: Mucous membranes are moist.     Pharynx: No oropharyngeal exudate or posterior oropharyngeal erythema.  Eyes:     Extraocular Movements: Extraocular movements intact.     Conjunctiva/sclera: Conjunctivae normal.     Pupils: Pupils are equal, round, and reactive to light.  Cardiovascular:     Rate and Rhythm: Normal rate and regular rhythm.     Heart sounds: No murmur heard. Pulmonary:     Effort: Pulmonary effort is normal. No respiratory distress.     Breath sounds: No wheezing, rhonchi or rales.  Chest:     Chest wall: No tenderness.  Musculoskeletal:     Cervical back: Neck supple.  Lymphadenopathy:     Cervical: No cervical adenopathy.  Skin:    Capillary Refill: Capillary refill takes less than 2 seconds.     Coloration: Skin is not jaundiced or pale.  Neurological:     General: No focal deficit  present.     Mental Status: She is alert and oriented to person, place, and time.  Psychiatric:        Behavior: Behavior normal.      UC Treatments / Results  Labs (all labs ordered are listed, but only abnormal results are displayed) Labs Reviewed - No data to display  EKG   Radiology No results found.  Procedures Procedures (including critical care time)  Medications Ordered in UC Medications - No data to display  Initial Impression / Assessment and Plan / UC Course  I have reviewed the triage vital signs and the nursing notes.  Pertinent labs & imaging results that were available during my care of the patient were reviewed by me and considered in my medical decision making (see chart for  details).     Ciprodex sent for OE, and levaquin orally in case this is diffuse OE, as she is at risk with her DM. Final Clinical Impressions(s) / UC Diagnoses   Final diagnoses:  Acute diffuse otitis externa of right ear     Discharge Instructions      Put Ciprodex drops in the right ear 2 times daily for 7 days   Levaquin 500 mg --1 tablet daily for 7 days.  Keep your follow-up with the ENT     ED Prescriptions     Medication Sig Dispense Auth. Provider   levofloxacin (LEVAQUIN) 500 MG tablet Take 1 tablet (500 mg total) by mouth daily for 7 days. 7 tablet Jazzman Loughmiller, Janace Aris, MD   ciprofloxacin-dexamethasone (CIPRODEX) OTIC suspension Place 4 drops into the right ear 2 (two) times daily for 7 days. 7.5 mL Zenia Resides, MD      PDMP not reviewed this encounter.   Zenia Resides, MD 10/01/22 1255

## 2022-10-01 NOTE — ED Triage Notes (Addendum)
Seen 4/25 for the same.  Was treated with antibiotics.  Reports difficulty swallowing and feels like something in ear.  Right ear is popping.  Patient reports she has one more antibiotic left.    Reports contacting ENT, but first appt was late May-can't wait and did not make an appt  Patient reports having the same symptoms and resistant ear pain in the past.  Patient was seen at ENT and got some sort of ear drops that she is wanting today

## 2022-10-01 NOTE — Discharge Instructions (Signed)
Put Ciprodex drops in the right ear 2 times daily for 7 days   Levaquin 500 mg --1 tablet daily for 7 days.  Keep your follow-up with the ENT

## 2022-10-03 ENCOUNTER — Encounter
Payer: Medicaid Other | Attending: Physical Medicine and Rehabilitation | Admitting: Physical Medicine and Rehabilitation

## 2022-10-03 DIAGNOSIS — M7712 Lateral epicondylitis, left elbow: Secondary | ICD-10-CM | POA: Insufficient documentation

## 2022-10-03 DIAGNOSIS — M25562 Pain in left knee: Secondary | ICD-10-CM | POA: Insufficient documentation

## 2022-10-03 DIAGNOSIS — M25561 Pain in right knee: Secondary | ICD-10-CM | POA: Insufficient documentation

## 2022-10-03 DIAGNOSIS — M792 Neuralgia and neuritis, unspecified: Secondary | ICD-10-CM | POA: Insufficient documentation

## 2022-10-03 DIAGNOSIS — M79604 Pain in right leg: Secondary | ICD-10-CM | POA: Insufficient documentation

## 2022-10-12 ENCOUNTER — Ambulatory Visit: Payer: Medicaid Other | Admitting: Podiatry

## 2022-10-12 DIAGNOSIS — L600 Ingrowing nail: Secondary | ICD-10-CM

## 2022-10-12 NOTE — Progress Notes (Signed)
Subjective:  Patient ID: Sarah Garrison, female    DOB: [birth year],  MRN: [id number]  Chief Complaint  Patient presents with   Ingrown Toenail    right great toe pain possible ingrown on other side of toe    42 y.o. female presents with concern for ingrown nail in the lateral border of the right great toe.  She says it is tender at the distal tip of the toe.  She does not want another procedure for ingrown lesion at this time.  Past Medical History:  Diagnosis Date   Anxiety    Asthma    Chronic constipation    Chronic shortness of breath    Depression    GERD (gastroesophageal reflux disease)    History of COVID-19 09/29/2019   09-28-2020 result in epic; hospital admission 10-05-2019 for covid pneumonia with hypoxic acute respiratory failure, no oxygen needed when discharged 10-06-2019;  and positive 06-13-2020 result in epic, pt stated no symptoms   History of seizures    (05-04-2021  pt stated was told during hospital admission 05/ 2021 with covid , she had 2 seizures,  stated no medication and no seizure since)   Hypertension    Insulin dependent type 2 diabetes mellitus [facility])    endocrinologist-- Fredia Sorrow NP;   pt uses insulin pump   Insulin pump in place    Mixed hyperlipidemia    OSA (obstructive sleep apnea)    ( 05-04-2021 pt stated has not used cpap since 05/ 2021) followed by dr byrum;  study in epic 02-16-2019 moderate , titrated cpap 08-28-2019   Plantar fasciitis, right    w/ chronic pain    Allergies  Allergen Reactions   Morphine And Related Shortness Of Breath   Glyburide Diarrhea and Other (See Comments)    Reaction:  Nose bleeds    Hydrocodone Other (See Comments)    Pt states that this medication makes her "dream about rabbits chasing her"   Ivp Dye [Iodinated Contrast Media] Other (See Comments)    Shortness of breath.     Oxycodone Other (See Comments)    Pt states that this medication makes her "dream about rabbits chasing" her.      ROS:  Negative except as per HPI above  Objective:  General: AAO x3, NAD  Dermatological: Pain with patient on the lateral border of right hallux nail.  No erythema edema or drainage from the area.  The prior area of ingrown on the medial border is well-healed with no issues at this time no pain on that side.  Vascular:  Dorsalis Pedis artery and Posterior Tibial artery pedal pulses are 2/4 bilateral.  Capillary fill time < 3 sec to all digits.   Neruologic: Grossly intact via light touch bilateral. Protective threshold intact to all sites bilateral.   Musculoskeletal: No gross boney pedal deformities bilateral. No pain, crepitus, or limitation noted with foot and ankle range of motion bilateral. Muscular strength 5/5 in all groups tested bilateral.  Gait: Unassisted, Nonantalgic.   No images are attached to the encounter.   Assessment:   1. Ingrown nail of great toe of right foot      Plan:  Patient was evaluated and treated and all questions answered.  # Ingrown nail lateral border right hallux -Discussed possible phenol and alcohol matrixectomy of the lateral border however patient does not want that procedure at this time due to concern of the shots -She instead prefer me to perform an aggressive slant back procedure on  the nail which I did and believed all of her pain -Discussed that if this continues to recur in the future we may have to consider doing the procedure for now she is okay recommend Epsom salt soaks          Corinna Gab, DPM [facility] / [facility]

## 2022-10-22 ENCOUNTER — Other Ambulatory Visit: Payer: Self-pay

## 2022-10-22 ENCOUNTER — Encounter (HOSPITAL_COMMUNITY): Payer: Self-pay

## 2022-10-22 ENCOUNTER — Ambulatory Visit (HOSPITAL_BASED_OUTPATIENT_CLINIC_OR_DEPARTMENT_OTHER)
Admit: 2022-10-22 | Discharge: 2022-10-22 | Disposition: A | Discharge status: Home / Self Care | Payer: Medicaid Other | Attending: *Deleted | Admitting: *Deleted

## 2022-10-22 ENCOUNTER — Ambulatory Visit (HOSPITAL_COMMUNITY)
Admission: RE | Admit: 2022-10-22 | Discharge: 2022-10-22 | Disposition: A | Discharge status: Home / Self Care | Payer: Medicaid Other | Source: Ambulatory Visit | Attending: Urgent Care | Admitting: Urgent Care

## 2022-10-22 ENCOUNTER — Telehealth (HOSPITAL_COMMUNITY): Payer: Self-pay | Admitting: Urgent Care

## 2022-10-22 VITALS — BP 132/88 | HR 109 | Temp 99.0°F | Resp 20

## 2022-10-22 DIAGNOSIS — M79662 Pain in left lower leg: Secondary | ICD-10-CM | POA: Diagnosis present

## 2022-10-22 DIAGNOSIS — M7989 Other specified soft tissue disorders: Secondary | ICD-10-CM | POA: Diagnosis present

## 2022-10-22 MED ORDER — ETODOLAC 500 MG PO TABS
500.0000 mg | ORAL_TABLET | Freq: Two times a day (BID) | ORAL | 0 refills | Status: AC
  Filled 2022-10-22: qty 14, 7d supply, fill #0

## 2022-10-22 MED ORDER — MELOXICAM 7.5 MG PO TABS
7.5000 mg | ORAL_TABLET | Freq: Every day | ORAL | 0 refills | Status: DC
  Filled 2022-10-22: qty 7, 7d supply, fill #0

## 2022-10-22 MED ORDER — CEPHALEXIN 500 MG PO CAPS
500.0000 mg | ORAL_CAPSULE | Freq: Four times a day (QID) | ORAL | 0 refills | Status: AC
  Filled 2022-10-22: qty 20, 5d supply, fill #0

## 2022-10-22 NOTE — ED Notes (Signed)
Notified Dorothy at vascular studies.  Patient is scheduled for [place]  at 2:00 pm today

## 2022-10-22 NOTE — ED Notes (Signed)
Told to go to [place] directly from here

## 2022-10-22 NOTE — ED Triage Notes (Signed)
Bruise to left leg that is getting larger.  Denies any injury.  Patient noticed lower leg pain, calf pain that started 3 days ago.  Appears to have a slight bruising to back of leg and slight fullness in lower leg below calf of leg.    Patient has taken ibuprofen and tylenol

## 2022-10-22 NOTE — ED Provider Notes (Signed)
[facility]    CSN: [id number] Arrival date & time: 10/22/22  1100      History   Chief Complaint Chief Complaint  Patient presents with   Appointment    11:30   Leg Pain    HPI Sarah Garrison is a 42 y.o. female.   Pleasant 42 year old female presents today due to concern of severe left lower calf pain.  She states it started 3 days ago and is getting progressively worse.  She states just gentle touch causes severe pain.  It starts about mid calf distally to just above her Achilles.  She reports a palpable knot in that area.  Denies any erythema or warmth of the skin.  Patient does not smoke, denies any recent long car rides or trips, no history of blood clot in the past, no recent trauma or injury to the leg.  Patient is on Depo-Provera, does not take estrogens.  She is insulin-dependent diabetic, states her sugars are well-controlled.  She currently has tachycardia, but states this is chronic for her as she "has heart issues".  She currently denies any shortness of breath, chest pains, palpitations.   Leg Pain   Past Medical History:  Diagnosis Date   Anxiety    Asthma    Chronic constipation    Chronic shortness of breath    Depression    GERD (gastroesophageal reflux disease)    History of COVID-19 09/29/2019   09-28-2020 result in epic; hospital admission 10-05-2019 for covid pneumonia with hypoxic acute respiratory failure, no oxygen needed when discharged 10-06-2019;  and positive 06-13-2020 result in epic, pt stated no symptoms   History of seizures    (05-04-2021  pt stated was told during hospital admission 05/ 2021 with covid , she had 2 seizures,  stated no medication and no seizure since)   Hypertension    Insulin dependent type 2 diabetes mellitus (HCC)    endocrinologist-- Fredia Sorrow NP;   pt uses insulin pump   Insulin pump in place    Mixed hyperlipidemia    OSA (obstructive sleep apnea)    ( 05-04-2021 pt stated has not used cpap since  05/ 2021) followed by dr byrum;  study in epic 02-16-2019 moderate , titrated cpap 08-28-2019   Plantar fasciitis, right    w/ chronic pain    Patient Active Problem List   Diagnosis Date Noted   Left tennis elbow 08/24/2022   Pain in both knees 08/24/2022   Neurogenic pain of right foot 08/24/2022   Pain of right lower extremity 07/26/2022   Palpitations 07/31/2021   Fatigue 07/31/2021   Primary hypertension 07/31/2021   Hip pain 12/08/2019   Hyperlipidemia associated with type 2 diabetes mellitus (HCC) 11/23/2019   History of COVID-19 10/27/2019   Stuttering 10/27/2019   Depression with anxiety 10/27/2019   Obesity, Class III, BMI 40-49.9 (morbid obesity) (HCC) 06/30/2019   OSA (obstructive sleep apnea) 02/16/2019   Essential hypertension 06/22/2018   Type 2 diabetes mellitus with hyperosmolarity without coma, with long-term current use of insulin (HCC) 12/13/2016   Asthma 07/30/2016   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/07/2016   DM neuropathy, type II diabetes mellitus (HCC) 08/24/2015    Past Surgical History:  Procedure Laterality Date   CESAREAN SECTION  1999   PLANTAR FASCIA RELEASE Right 05/08/2021   Procedure: ENDOSCOPIC PLANTAR FASCIOTOMY;  Surgeon: Asencion Islam, DPM;  Location: [facility];  Service: Podiatry;  Laterality: Right;  WITH BLOCK   STERIOD INJECTION Bilateral 05/08/2021  Procedure: PLATELET RICH PLASMA INJECTION HEELS;  Surgeon: Asencion Islam, DPM;  Location: [facility];  Service: Podiatry;  Laterality: Bilateral;   TUBAL LIGATION Bilateral 09/29/2006   @[facility] ;   [facility]   VENTRAL HERNIA REPAIR N/A 11/27/2017   Procedure: VENTRAL HERNIA REPAIR ERAS PATHWAY;  Surgeon: Harriette Bouillon, MD;  Location: [facility];  Service: General;  Laterality: N/A;   WISDOM TOOTH EXTRACTION      OB History     Gravida  5   Para  4   Term      Preterm      AB  1   Living  4      SAB  1   IAB      Ectopic      Multiple       Live Births               Home Medications    Prior to Admission medications   Medication Sig Start Date End Date Taking? Authorizing Provider  atorvastatin (LIPITOR) 40 MG tablet Take 1 tablet (40 mg total) by mouth daily. 05/31/22   Hoy Register, MD  cetirizine (ZYRTEC ALLERGY) 10 MG tablet Take 1 tablet (10 mg total) by mouth daily. 09/27/22   Rising, Lurena Joiner, PA-C  Continuous Blood Gluc Sensor (DEXCOM G6 SENSOR) MISC USE AND CHANGE SENSOR EVERY 10 DAYS AS DIRECTED 03/14/22   [provider]  Continuous Blood Gluc Sensor (DEXCOM G6 SENSOR) MISC SMARTSIG:Topical Every 10 Days    [provider]  Continuous Blood Gluc Transmit (DEXCOM G6 TRANSMITTER) MISC USE TO CHECK BLOOD SUGAR EVERY DAY. CHANGE EVERY 3 MONTHS AS DIRECTED 03/14/22   [provider]  fluconazole (DIFLUCAN) 150 MG tablet Take 1 tablet (150 mg total) by mouth once as needed for up to 2 doses (take one pill on day 1, and the second pill 3 days later). Patient not taking: Reported on 10/22/2022 09/27/22   Rising, Lurena Joiner, PA-C  fluticasone [facility]) 50 MCG/ACT nasal spray Place 2 sprays into both nostrils daily. 09/27/22   Rising, Lurena Joiner, PA-C  insulin aspart (NOVOLOG FLEXPEN) 100 UNIT/ML FlexPen Inject into the skin 3 (three) times daily before meals. ONLY WHEN INSULIN PUMP MALFUNCTION'S PER ENDOCRINOLOGY DIRECTION    [provider]  insulin aspart (NOVOLOG) 100 UNIT/ML injection USE VIA INSULIN PUMP. MAX TOTAL DAILY DOSE OF 200 UNITS 11/13/21     insulin aspart (NOVOLOG) 100 UNIT/ML injection USE VIA INSULIN PUMP. MAX TOTAL DAILY DOSE OF 250 UNITS 07/30/22     Insulin Human (INSULIN PUMP) SOLN Inject 1 each into the skin See admin instructions. Medication: Novolog 100 units/ml injection. Takes 100 units via pump per patient.    [provider]  Insulin Syringe-Needle U-100 31G X 5/16" 1 ML MISC inject 10 units into the skin 3 times daily. use to inject novolog based on  insulin to card ratio- max daily dose 150 units 09/23/20     olopatadine (PATANOL) 0.1 % ophthalmic solution Place 1 drop into both eyes 2 (two) times daily. 09/27/22   Rising, Rebecca, PA-C  OZEMPIC, 0.25 OR 0.5 MG/DOSE, 2 MG/3ML SOPN Inject into the skin. 06/13/22   [provider]  ARIPiprazole (ABILIFY) 5 MG tablet Take 1 tablet (5 mg total) by mouth daily. 10/27/19 06/13/20  Sater, Pearletha Furl, MD    Family History Family History  Problem Relation Age of Onset   Asthma Mother    Kidney failure Mother    Brain cancer Mother  Asthma Father    Other Father        surgery on stomach but don't know from what   Breast cancer Maternal Grandmother    Diabetes Maternal Grandmother    Diabetes Brother     Social History Social History   Tobacco Use   Smoking status: Former    Packs/day: 0.10    Years: 2.00    Additional pack years: 0.00    Total pack years: 0.20    Types: Cigarettes    Quit date: 11/27/2017    Years since quitting: 4.9   Smokeless tobacco: Never  Vaping Use   Vaping Use: Former   Devices: Elfvar  (05-04-2021 per pt last vaped approx 2 wks ago)  Substance Use Topics   Alcohol use: No   Drug use: Never     Allergies   Morphine and codeine, Glyburide, Hydrocodone, Ivp dye [iodinated contrast media], and Oxycodone   Review of Systems Review of Systems As per HPI  Physical Exam Triage Vital Signs ED Triage Vitals  Enc Vitals Group     BP 10/22/22 1142 132/88     Pulse Rate 10/22/22 1142 (!) 109     Resp 10/22/22 1142 20     Temp 10/22/22 1142 99 F (37.2 C)     Temp Source 10/22/22 1142 Oral     SpO2 10/22/22 1142 96 %     Weight --      Height --      Head Circumference --      Peak Flow --      Pain Score 10/22/22 1138 6     Pain Loc --      Pain Edu? --      Excl. in GC? --    No data found.  Updated Vital Signs BP 132/88 (BP Location: Left Arm) Comment (BP Location): large cuff  Pulse (!) 109   Temp 99 F (37.2 C) (Oral)    Resp 20   SpO2 96%   Visual Acuity Right Eye Distance:   Left Eye Distance:   Bilateral Distance:    Right Eye Near:   Left Eye Near:    Bilateral Near:     Physical Exam Vitals and nursing note reviewed.  Constitutional:      General: She is not in acute distress.    Appearance: Normal appearance. She is obese. She is not ill-appearing, toxic-appearing or diaphoretic.  Cardiovascular:     Rate and Rhythm: Tachycardia present.  Pulmonary:     Effort: Pulmonary effort is normal. No respiratory distress.  Musculoskeletal:        General: Swelling and tenderness present. Normal range of motion.     Right lower leg: No swelling. No edema.     Left lower leg: Swelling and tenderness present. Edema present.     Right ankle: Normal. No swelling or deformity. No tenderness. Normal range of motion. Normal pulse.     Right Achilles Tendon: Normal.     Left ankle: Normal. No swelling or deformity. No tenderness. Normal range of motion. Normal pulse.     Left Achilles Tendon: Normal.     Right foot: Normal. Normal range of motion. No swelling, deformity, tenderness or bony tenderness. Normal pulse.     Left foot: Normal. Normal range of motion. No swelling, deformity, tenderness or bony tenderness. Normal pulse.       Legs:     Comments: Negative Homan sign  Skin:    General: Skin is warm  and dry.     Coloration: Skin is not jaundiced.     Findings: No bruising, erythema, lesion or rash.  Neurological:     Mental Status: She is alert.      UC Treatments / Results  Labs (all labs ordered are listed, but only abnormal results are displayed) Labs Reviewed - No data to display  EKG   Radiology No results found.  Procedures Procedures (including critical care time)  Medications Ordered in UC Medications - No data to display  Initial Impression / Assessment and Plan / UC Course  I have reviewed the triage vital signs and the nursing notes.  Pertinent labs & imaging  results that were available during my care of the patient were reviewed by me and considered in my medical decision making (see chart for details).     Left lower extremity swelling -I do not see evidence of cellulitis on physical exam.  I do have clinical concern for possible DVT.  I have ordered a stat venous Doppler to be performed at [facility] long around 2:00 this afternoon. Pain in left calf -pain was located slightly distal to the gastrocnemius; there was a palpable knot in the soft tissue at the area of maximal pain.  Ultrasound obtained for further assessment prior to recommending treatment Tachycardia -patient reports this is chronic for her.  She denies shortness of breath, chest pain, or palpitations.  Pt instructed to head to [facility] for Venous doppler at 2:00pm Final Clinical Impressions(s) / UC Diagnoses   Final diagnoses:  Left leg swelling  Pain of left calf     Discharge Instructions      You have an appointment today at 2:00pm at [facility] for a venous doppler US. Please arrive to the front reception desk and they can direct you to the location of the ultrasound. We will contact you with the results once the test has been read. Please arrive 15 minutes early for your appointment (1:45)     ED Prescriptions   None    PDMP not reviewed this encounter.   Maretta Bees, [place] 10/22/22 1339

## 2022-10-22 NOTE — Telephone Encounter (Signed)
Called and spoke with patient regarding her vascular ultrasound results.  2 identifiers obtained.  Venous Doppler was negative for DVT.  I will recommend starting cephalexin for early possible developing cellulitis, we will also recommend moist warm heat compresses and NSAIDs to treat for possible phlebitis.  Patient to call Dr. Baxter Flattery office and schedule follow-up within the next few days. Patient instructed to avoid any over-the-counter anti-inflammatory medications.  All questions and concerns were addressed, patient stated understanding with treatment plan.

## 2022-10-22 NOTE — Discharge Instructions (Signed)
You have an appointment today at 2:00pm at [facility] for a venous doppler US. Please arrive to the front reception desk and they can direct you to the location of the ultrasound. We will contact you with the results once the test has been read. Please arrive 15 minutes early for your appointment (1:45)

## 2022-10-23 ENCOUNTER — Other Ambulatory Visit: Payer: Self-pay

## 2022-10-23 ENCOUNTER — Ambulatory Visit: Payer: Medicaid Other | Admitting: Family Medicine

## 2022-10-23 MED ORDER — PANTOPRAZOLE SODIUM 40 MG PO TBEC
40.0000 mg | DELAYED_RELEASE_TABLET | Freq: Every day | ORAL | 3 refills | Status: DC
  Filled 2022-10-23: qty 30, 30d supply, fill #0
  Filled 2023-02-28: qty 30, 30d supply, fill #1

## 2022-10-23 MED ORDER — FLUCONAZOLE 150 MG PO TABS
150.0000 mg | ORAL_TABLET | Freq: Once | ORAL | 0 refills | Status: AC
  Filled 2022-10-23: qty 1, 1d supply, fill #0

## 2022-10-24 ENCOUNTER — Other Ambulatory Visit: Payer: Self-pay

## 2022-10-25 ENCOUNTER — Ambulatory Visit: Payer: Medicaid Other

## 2022-10-26 ENCOUNTER — Ambulatory Visit: Payer: Medicaid Other | Attending: Family Medicine

## 2022-10-30 ENCOUNTER — Encounter: Payer: Self-pay | Admitting: Family Medicine

## 2022-10-30 ENCOUNTER — Other Ambulatory Visit: Payer: Self-pay

## 2022-10-30 ENCOUNTER — Ambulatory Visit: Payer: Medicaid Other | Attending: Family Medicine | Admitting: Family Medicine

## 2022-10-30 VITALS — BP 124/88 | HR 100 | Ht 64.0 in | Wt 250.2 lb

## 2022-10-30 DIAGNOSIS — R21 Rash and other nonspecific skin eruption: Secondary | ICD-10-CM

## 2022-10-30 DIAGNOSIS — R2242 Localized swelling, mass and lump, left lower limb: Secondary | ICD-10-CM

## 2022-10-30 DIAGNOSIS — Z111 Encounter for screening for respiratory tuberculosis: Secondary | ICD-10-CM

## 2022-10-30 MED ORDER — TRIAMCINOLONE ACETONIDE 0.1 % EX CREA: 1.0000 | TOPICAL_CREAM | Freq: Two times a day (BID) | CUTANEOUS | 0 refills | Status: DC

## 2022-10-30 NOTE — Patient Instructions (Signed)
Rash, Adult A rash is a breakout of spots or blotches on the skin. It can affect the way the skin looks and feels. Many things can cause a rash. Common causes include: Viral infections. These include colds, measles, and hand, foot, and mouth disease. Bacterial infections. These include scarlet fever and impetigo. Fungal infections. These include athlete's foot, ringworm, and yeast rashes. Skin irritation. This may be from heat rash, exposure to moisture or friction for a long time (intertrigo), or exposure to soap or skin care products (eczema). Allergic reactions. These may be caused by foods, medicines, or things like poison ivy. Some rashes may go away after a few days. Others may last for a few weeks. The goal of treatment is to stop the itching and keep the rash from spreading. Follow these instructions at home: Medicine Take or apply over-the-counter and prescription medicines only as told by your health care provider. These may include: Corticosteroids. These can help treat red or swollen skin. They may be given as creams or as medicines to take by mouth (orally). Anti-itch lotions. Allergy medicines. Pain medicine. Antifungal medicine if the rash is from a fungal infection. Antibiotics if you have an infection.  Skin care Apply cool, wet cloths (compresses) to the affected areas. Do not scratch or rub your skin. Avoid covering the rash. Keep it exposed to air as often as you can. Managing itching and discomfort Avoid hot showers and baths. These can make itching worse. A cold shower may help. Try taking a bath with: Epsom salts. You can get these at your local pharmacy or grocery store. Follow the instructions on the package. Baking soda. Pour a small amount into the bath as told by your provider. Colloidal oatmeal. You can get this at your local pharmacy or grocery store. Follow the instructions on the package. Try putting baking soda paste on your skin. Stir water into baking  soda until it becomes like a paste. Try using calamine lotion or cortisone cream to help with itchiness. Keep cool. Stay out of the sun. Sweating and being hot can make itching worse. General instructions  Rest as needed. Drink enough fluid to keep your pee (urine) pale yellow. Wear loose-fitting clothes. Avoid scented soaps, detergents, and perfumes. Use gentle soaps, detergents, perfumes, and cosmetics. Avoid the things that cause your rash (triggers). Keep a journal to help keep track of your triggers. Write down: What you eat. What cosmetics you use. What you drink. What you wear. This includes jewelry. Contact a health care provider if: You sweat at night more than normal. You pee (urinate) more or less than normal, or your pee is a darker color than normal. Your eyes become sensitive to light. Your skin or the white parts of your eyes turn yellow (jaundice). Your skin tingles or is numb. You get painful blisters in your nose or mouth. Your rash does not go away after a few days, or it gets worse. You are more tired or thirsty than normal. You have new or worse symptoms. These may include: Pain in your abdomen. Fever. Diarrhea or vomiting. Weakness or weight loss. Get help right away if: You get confused. You have a severe headache, a stiff neck, or severe joint pain or stiffness. You become very sleepy or not responsive. You have a seizure. This information is not intended to replace advice given to you by your health care provider. Make sure you discuss any questions you have with your health care provider. Document Revised: 03/09/2022 Document Reviewed:   03/09/2022 Elsevier Patient Education  2024 [employer]  

## 2022-10-30 NOTE — Progress Notes (Signed)
Subjective:  Patient ID: Sarah Garrison, female    DOB: [birth year]  Age: 42 y.o. MRN: [id number]  CC: Hospitalization Follow-up   HPI Sarah Garrison is a 42 y.o. year old female with a history of  type 2 diabetes mellitus (A1c 7.6 -followed by endocrinology, [facility]) anxiety, GERD seen for an office visit.    Interval History:  1 week ago she had an ED visit for left calf pain and swelling.  Doppler was negative for DVT.  She was placed on etodolac and subsequently discharged. She still has the knot in her left lower calf which extended to her achilles tendon area previously but has now shrunk in size. The tenderness has also improved and is now a 3/10.  She also developed a pruritic rash in her left upper chest wall and attributes it to the medication she was prescribed as needed. Past Medical History:  Diagnosis Date   Anxiety    Asthma    Chronic constipation    Chronic shortness of breath    Depression    GERD (gastroesophageal reflux disease)    History of COVID-19 09/29/2019   09-28-2020 result in epic; hospital admission 10-05-2019 for covid pneumonia with hypoxic acute respiratory failure, no oxygen needed when discharged 10-06-2019;  and positive 06-13-2020 result in epic, pt stated no symptoms   History of seizures    (05-04-2021  pt stated was told during hospital admission 05/ 2021 with covid , she had 2 seizures,  stated no medication and no seizure since)   Hypertension    Insulin dependent type 2 diabetes mellitus [facility])    endocrinologist-- Fredia Sorrow NP;   pt uses insulin pump   Insulin pump in place    Mixed hyperlipidemia    OSA (obstructive sleep apnea)    ( 05-04-2021 pt stated has not used cpap since 05/ 2021) followed by dr byrum;  study in epic 02-16-2019 moderate , titrated cpap 08-28-2019   Plantar fasciitis, right    w/ chronic pain    Past Surgical History:  Procedure Laterality Date   CESAREAN SECTION  1999   PLANTAR FASCIA RELEASE  Right 05/08/2021   Procedure: ENDOSCOPIC PLANTAR FASCIOTOMY;  Surgeon: Asencion Islam, DPM;  Location: [facility];  Service: Podiatry;  Laterality: Right;  WITH BLOCK   STERIOD INJECTION Bilateral 05/08/2021   Procedure: PLATELET RICH PLASMA INJECTION HEELS;  Surgeon: Asencion Islam, DPM;  Location: [facility];  Service: Podiatry;  Laterality: Bilateral;   TUBAL LIGATION Bilateral 09/29/2006   @[facility] ;   [facility]   VENTRAL HERNIA REPAIR N/A 11/27/2017   Procedure: VENTRAL HERNIA REPAIR ERAS PATHWAY;  Surgeon: Harriette Bouillon, MD;  Location: [facility];  Service: General;  Laterality: N/A;   WISDOM TOOTH EXTRACTION      Family History  Problem Relation Age of Onset   Asthma Mother    Kidney failure Mother    Brain cancer Mother    Asthma Father    Other Father        surgery on stomach but don't know from what   Breast cancer Maternal Grandmother    Diabetes Maternal Grandmother    Diabetes Brother     Social History   Socioeconomic History   Marital status: Single    Spouse name: Not on file   Number of children: 4   Years of education: Not on file   Highest education level: Not on file  Occupational History   Not on  file  Tobacco Use   Smoking status: Former    Packs/day: 0.10    Years: 2.00    Additional pack years: 0.00    Total pack years: 0.20    Types: Cigarettes    Quit date: 11/27/2017    Years since quitting: 4.9   Smokeless tobacco: Never  Vaping Use   Vaping Use: Former   Devices: Elfvar  (05-04-2021 per pt last vaped approx 2 wks ago)  Substance and Sexual Activity   Alcohol use: No   Drug use: Never   Sexual activity: Yes    Birth control/protection: Surgical, Injection  Other Topics Concern   Not on file  Social History Narrative   Right handed   No caffeine use   Diet coke sometimes    Social Determinants of Health   Financial Resource Strain: Not on file  Food Insecurity: Not on file  Transportation  Needs: Not on file  Physical Activity: Not on file  Stress: Not on file  Social Connections: Not on file    Allergies  Allergen Reactions   Morphine And Codeine Shortness Of Breath   Glyburide Diarrhea and Other (See Comments)    Reaction:  Nose bleeds    Hydrocodone Other (See Comments)    Pt states that this medication makes her "dream about rabbits chasing her"   Ivp Dye [Iodinated Contrast Media] Other (See Comments)    Shortness of breath.     Oxycodone Other (See Comments)    Pt states that this medication makes her "dream about rabbits chasing" her.      Outpatient Medications Prior to Visit  Medication Sig Dispense Refill   atorvastatin (LIPITOR) 40 MG tablet Take 1 tablet (40 mg total) by mouth daily. 90 tablet 1   cetirizine (ZYRTEC ALLERGY) 10 MG tablet Take 1 tablet (10 mg total) by mouth daily. 30 tablet 2   Continuous Blood Gluc Sensor (DEXCOM G6 SENSOR) MISC USE AND CHANGE SENSOR EVERY 10 DAYS AS DIRECTED     Continuous Blood Gluc Sensor (DEXCOM G6 SENSOR) MISC SMARTSIG:Topical Every 10 Days     Continuous Blood Gluc Transmit (DEXCOM G6 TRANSMITTER) MISC USE TO CHECK BLOOD SUGAR EVERY DAY. CHANGE EVERY 3 MONTHS AS DIRECTED     fluticasone (FLONASE) 50 MCG/ACT nasal spray Place 2 sprays into both nostrils daily. 16 g 2   insulin aspart (NOVOLOG FLEXPEN) 100 UNIT/ML FlexPen Inject into the skin 3 (three) times daily before meals. ONLY WHEN INSULIN PUMP MALFUNCTION'S PER ENDOCRINOLOGY DIRECTION     insulin aspart (NOVOLOG) 100 UNIT/ML injection USE VIA INSULIN PUMP. MAX TOTAL DAILY DOSE OF 200 UNITS 90 mL 5   insulin aspart (NOVOLOG) 100 UNIT/ML injection USE VIA INSULIN PUMP. MAX TOTAL DAILY DOSE OF 250 UNITS 100 mL 5   Insulin Human (INSULIN PUMP) SOLN Inject 1 each into the skin See admin instructions. Medication: Novolog 100 units/ml injection. Takes 100 units via pump per patient.     Insulin Syringe-Needle U-100 31G X 5/16" 1 ML MISC inject 10 units into the skin 3  times daily. use to inject novolog based on insulin to card ratio- max daily dose 150 units 100 each 2   meloxicam (MOBIC) 7.5 MG tablet Take 1 tablet (7.5 mg total) by mouth daily. 7 tablet 0   olopatadine (PATANOL) 0.1 % ophthalmic solution Place 1 drop into both eyes 2 (two) times daily. 5 mL 0   OZEMPIC, 0.25 OR 0.5 MG/DOSE, 2 MG/3ML SOPN Inject into the skin.  pantoprazole (PROTONIX) 40 MG tablet Take 1 tablet (40 mg total) by mouth daily. 30 tablet 3   fluconazole (DIFLUCAN) 150 MG tablet Take 1 tablet (150 mg total) by mouth once as needed for up to 2 doses (take one pill on day 1, and the second pill 3 days later). (Patient not taking: Reported on 10/22/2022) 2 tablet 0   No facility-administered medications prior to visit.     ROS Review of Systems  Constitutional:  Negative for activity change and appetite change.  HENT:  Negative for sinus pressure and sore throat.   Respiratory:  Negative for chest tightness, shortness of breath and wheezing.   Cardiovascular:  Negative for chest pain and palpitations.  Gastrointestinal:  Negative for abdominal distention, abdominal pain and constipation.  Genitourinary: Negative.   Musculoskeletal:        See HPI  Skin:  Positive for rash.  Psychiatric/Behavioral:  Negative for behavioral problems and dysphoric mood.     Objective:  BP 124/88   Pulse 100   Ht 5\' 4"  (1.626 m)   Wt 250 lb 3.2 oz (113.5 kg)   SpO2 100%   BMI 42.95 kg/m      10/30/2022    9:57 AM 10/22/2022   11:42 AM 10/01/2022   12:09 PM  BP/Weight  Systolic BP 124 132 140  Diastolic BP 88 88 101  Wt. (Lbs) 250.2    BMI 42.95 kg/m2        Physical Exam Constitutional:      Appearance: She is well-developed.  Cardiovascular:     Rate and Rhythm: Normal rate.     Heart sounds: Normal heart sounds. No murmur heard. Pulmonary:     Effort: Pulmonary effort is normal.     Breath sounds: Normal breath sounds. No wheezing or rales.  Chest:     Chest wall:  No tenderness.  Abdominal:     General: Bowel sounds are normal. There is no distension.     Palpations: Abdomen is soft. There is no mass.     Tenderness: There is no abdominal tenderness.  Musculoskeletal:     Right lower leg: No edema.     Left lower leg: No edema.     Comments: Posterior lower third of left leg with tender mass, immobile, no erythema.  Tenderness at insertion of the Achilles tendon on the left.  Neurological:     Mental Status: She is alert and oriented to person, place, and time.  Psychiatric:        Mood and Affect: Mood normal.        Latest Ref Rng & Units 05/31/2022    4:56 PM 05/27/2021    6:11 PM 05/08/2021   12:48 PM  CMP  Glucose 70 - 99 mg/dL 161  096  045   BUN 6 - 24 mg/dL 16  9  13    Creatinine 0.57 - 1.00 mg/dL 4.09  8.11  9.14   Sodium 134 - 144 mmol/L 141  137  141   Potassium 3.5 - 5.2 mmol/L 4.1  3.9  3.7   Chloride 96 - 106 mmol/L 106  108  106   CO2 20 - 29 mmol/L 23  21    Calcium 8.7 - 10.2 mg/dL 9.7  8.8    Total Protein 6.0 - 8.5 g/dL 6.9     Total Bilirubin 0.0 - 1.2 mg/dL 0.2     Alkaline Phos 44 - 121 IU/L 112     AST 0 -  40 IU/L 15     ALT 0 - 32 IU/L 23       Lipid Panel     Component Value Date/Time   CHOL 244 (H) 10/26/2020 0908   TRIG 85 10/26/2020 0908   HDL 50 10/26/2020 0908   CHOLHDL 4.9 (H) 10/26/2020 0908   CHOLHDL 6 03/03/2018 1419   VLDL 37.4 03/03/2018 1419   LDLCALC 179 (H) 10/26/2020 0908    CBC    Component Value Date/Time   WBC 7.2 05/27/2021 1811   RBC 4.64 05/27/2021 1811   HGB 13.7 05/27/2021 1811   HGB 14.5 10/26/2020 0908   HCT 41.0 05/27/2021 1811   HCT 44.5 10/26/2020 0908   PLT 320 05/27/2021 1811   PLT 339 10/26/2020 0908   MCV 88.4 05/27/2021 1811   MCV 90 10/26/2020 0908   MCH 29.5 05/27/2021 1811   MCHC 33.4 05/27/2021 1811   RDW 12.1 05/27/2021 1811   RDW 12.4 10/26/2020 0908   LYMPHSABS 3.3 05/27/2021 1811   LYMPHSABS 3.1 10/26/2020 0908   MONOABS 0.5 05/27/2021 1811    EOSABS 0.1 05/27/2021 1811   EOSABS 0.0 10/26/2020 0908   BASOSABS 0.0 05/27/2021 1811   BASOSABS 0.0 10/26/2020 0908    Lab Results  Component Value Date   HGBA1C 12.2 (H) 10/07/2019    Assessment & Plan:  1. Localized swelling of left lower leg Doppler negative for DVT Will need to exclude Achilles tendon rupture - [place] LT LOWER EXTREM LTD SOFT TISSUE NON VASCULAR; Future  2. Rash and nonspecific skin eruption Unknown etiology - triamcinolone cream (KENALOG) 0.1 %; Apply 1 Application topically 2 (two) times daily.  Dispense: 30 g; Refill: 0  3. Screening-pulmonary TB - TB Skin Test    Meds ordered this encounter  Medications   triamcinolone cream (KENALOG) 0.1 %    Sig: Apply 1 Application topically 2 (two) times daily.    Dispense:  30 g    Refill:  0    Follow-up: Return for previously scheduled appointment.       Hoy Register, MD, FAAFP. [facility] [place], [place] [phone]   10/30/2022, 1:08 PM

## 2022-11-01 ENCOUNTER — Emergency Department (HOSPITAL_COMMUNITY)
Admission: EM | Admit: 2022-11-01 | Discharge: 2022-11-01 | Disposition: A | Discharge status: Home / Self Care | Payer: Medicaid Other | Attending: Emergency Medicine | Admitting: Emergency Medicine

## 2022-11-01 ENCOUNTER — Emergency Department (HOSPITAL_COMMUNITY): Payer: Medicaid Other

## 2022-11-01 ENCOUNTER — Ambulatory Visit: Payer: Medicaid Other

## 2022-11-01 ENCOUNTER — Ambulatory Visit: Payer: Medicaid Other | Attending: Family Medicine

## 2022-11-01 ENCOUNTER — Encounter: Payer: Self-pay | Admitting: Critical Care Medicine

## 2022-11-01 ENCOUNTER — Ambulatory Visit (HOSPITAL_BASED_OUTPATIENT_CLINIC_OR_DEPARTMENT_OTHER): Payer: Medicaid Other | Admitting: Critical Care Medicine

## 2022-11-01 ENCOUNTER — Other Ambulatory Visit: Payer: Self-pay

## 2022-11-01 VITALS — BP 121/80 | HR 89 | Temp 98.0°F | Ht 64.0 in | Wt 245.0 lb

## 2022-11-01 DIAGNOSIS — I10 Essential (primary) hypertension: Secondary | ICD-10-CM | POA: Diagnosis not present

## 2022-11-01 DIAGNOSIS — R1013 Epigastric pain: Secondary | ICD-10-CM

## 2022-11-01 DIAGNOSIS — Z79899 Other long term (current) drug therapy: Secondary | ICD-10-CM | POA: Insufficient documentation

## 2022-11-01 DIAGNOSIS — E119 Type 2 diabetes mellitus without complications: Secondary | ICD-10-CM | POA: Insufficient documentation

## 2022-11-01 DIAGNOSIS — Z111 Encounter for screening for respiratory tuberculosis: Secondary | ICD-10-CM

## 2022-11-01 DIAGNOSIS — R1032 Left lower quadrant pain: Secondary | ICD-10-CM | POA: Diagnosis not present

## 2022-11-01 DIAGNOSIS — Z794 Long term (current) use of insulin: Secondary | ICD-10-CM | POA: Diagnosis not present

## 2022-11-01 LAB — COMPREHENSIVE METABOLIC PANEL
ALT: 19 U/L (ref 0–44)
AST: 18 U/L (ref 15–41)
Albumin: 3.8 g/dL (ref 3.5–5.0)
Alkaline Phosphatase: 75 U/L (ref 38–126)
Anion gap: 7 (ref 5–15)
BUN: 8 mg/dL (ref 6–20)
CO2: 23 mmol/L (ref 22–32)
Calcium: 9.2 mg/dL (ref 8.9–10.3)
Chloride: 108 mmol/L (ref 98–111)
Creatinine, Ser: 0.83 mg/dL (ref 0.44–1.00)
GFR, Estimated: >60 mL/min (ref >60)
Glucose, Bld: 182 mg/dL — ABNORMAL HIGH (ref 70–99)
Potassium: 3.5 mmol/L (ref 3.5–5.1)
Sodium: 138 mmol/L (ref 135–145)
Total Bilirubin: 0.9 mg/dL (ref 0.3–1.2)
Total Protein: 6.9 g/dL (ref 6.5–8.1)

## 2022-11-01 LAB — CBC
HCT: 42.2 % (ref 36.0–46.0)
Hemoglobin: 14.1 g/dL (ref 12.0–15.0)
MCH: 29.9 pg (ref 26.0–34.0)
MCHC: 33.4 g/dL (ref 30.0–36.0)
MCV: 89.4 fL (ref 80.0–100.0)
Platelets: 347 10*3/uL (ref 150–400)
RBC: 4.72 MIL/uL (ref 3.87–5.11)
RDW: 12.2 % (ref 11.5–15.5)
WBC: 7.8 10*3/uL (ref 4.0–10.5)
nRBC: 0 % (ref 0.0–0.2)

## 2022-11-01 LAB — TB SKIN TEST
Induration: 0 mm
TB Skin Test: NEGATIVE

## 2022-11-01 LAB — I-STAT BETA HCG BLOOD, ED (MC, WL, AP ONLY): I-stat hCG, quantitative: <5 m[IU]/mL (ref <5)

## 2022-11-01 LAB — LIPASE, BLOOD: Lipase: 28 U/L (ref 11–51)

## 2022-11-01 MED ORDER — ONDANSETRON HCL 4 MG/2ML IJ SOLN
4.0000 mg | Freq: Once | INTRAMUSCULAR | Status: AC
  Administered 2022-11-01: 4 mg via INTRAVENOUS
  Filled 2022-11-01: qty 2

## 2022-11-01 MED ORDER — SIMETHICONE 80 MG PO CHEW: 80.0000 mg | CHEWABLE_TABLET | Freq: Four times a day (QID) | ORAL | 0 refills | Status: DC | PRN

## 2022-11-01 MED ORDER — FAMOTIDINE 20 MG PO TABS: 20.0000 mg | ORAL_TABLET | Freq: Every day | ORAL | 0 refills | Status: DC

## 2022-11-01 MED ORDER — ONDANSETRON HCL 4 MG PO TABS: 4.0000 mg | ORAL_TABLET | Freq: Four times a day (QID) | ORAL | 0 refills | Status: DC

## 2022-11-01 MED ORDER — ALUM & MAG HYDROXIDE-SIMETH 200-200-20 MG/5ML PO SUSP
30.0000 mL | Freq: Once | ORAL | Status: AC
  Administered 2022-11-01: 30 mL via ORAL
  Filled 2022-11-01: qty 30

## 2022-11-01 MED ORDER — FAMOTIDINE IN NACL 20-0.9 MG/50ML-% IV SOLN
20.0000 mg | Freq: Once | INTRAVENOUS | Status: AC
  Administered 2022-11-01: 20 mg via INTRAVENOUS
  Filled 2022-11-01: qty 50

## 2022-11-01 MED ORDER — LACTATED RINGERS IV BOLUS
1000.0000 mL | Freq: Once | INTRAVENOUS | Status: AC
  Administered 2022-11-01: 1000 mL via INTRAVENOUS

## 2022-11-01 MED ORDER — FENTANYL CITRATE PF 50 MCG/ML IJ SOSY
50.0000 ug | PREFILLED_SYRINGE | Freq: Once | INTRAMUSCULAR | Status: AC
  Administered 2022-11-01: 50 ug via INTRAVENOUS
  Filled 2022-11-01: qty 1

## 2022-11-01 NOTE — ED Notes (Signed)
Unlocked patient for the bathroom patient did well walking  patient is now back in bed on the monitor with call bell in reach

## 2022-11-01 NOTE — Assessment & Plan Note (Signed)
Acute abdominal pain left lower quadrant with diarrhea Patient needs immediate imaging will not be able to get this on a rapid basis as an outpatient.  Patient is in some degree of distress and I feel needs emergency room evaluation.  Patient drove herself to the office and feels comfortable and I feel is safe for her to drive herself across the street to the emergency room.  I called ahead to notify the patient was coming by private vehicle.

## 2022-11-01 NOTE — Patient Instructions (Signed)
Go to [facility] ED for evaluation

## 2022-11-01 NOTE — Discharge Instructions (Signed)
You were seen in the emergency department for your abdominal pain.  It is likely related to stomach inflammation or acid reflux and I have given you an antacid to take daily for at least the next 2 weeks to see if it helps with your symptoms.  You can also take Maalox over-the-counter or use Gas-X and nausea medicine for additional pain.  You can follow-up with your primary doctor to have your symptoms rechecked.  You should follow-up with GI for having recurrent symptoms.  You should return to the emergency department if you are having significantly worsening pain, repetitive vomiting despite the nausea medication, fevers or any other new or concerning symptoms.

## 2022-11-01 NOTE — ED Triage Notes (Signed)
Pt. Stated, I started having stomach pain last night that goes to my back . I saw the Dr. This morning and he sent me here for a CT scan.

## 2022-11-01 NOTE — Progress Notes (Signed)
PPD Reading Note  PPD read and results entered in [employer].  Result: 0 mm induration.  Interpretation: Neg  If test not read within 48-72 hours of initial placement, patient advised to repeat in other arm 1-3 weeks after this test.  Allergic reaction: no

## 2022-11-01 NOTE — Progress Notes (Signed)
Acute Office Visit  Subjective:     Patient ID: Sarah Garrison, female    DOB: 11/26/1980, 42 y.o.   MRN: [id number]  Chief Complaint  Patient presents with   Abdominal Pain    HPI Patient is in today for a work in visit because of acute abdominal pain.  Patient has had epigastric and left lower quadrant abdominal pain for 3 days.  She has had this in the past and it is episodic.  There is no nausea or vomiting but there is also diarrhea.  There is no blood in the stool.  Patient has some degree of heartburn as well.  She is in mild distress not able to sit or lay down.  Pre-existing history of asthma as well as swelling left lower leg Type 2 diabetes with diabetic neuropathy sleep apnea hypertension hyperlipidemia   Review of Systems  Constitutional:  Negative for chills, diaphoresis, fever, malaise/fatigue and weight loss.  HENT:  Negative for congestion, hearing loss, nosebleeds, sore throat and tinnitus.   Eyes:  Negative for blurred vision, photophobia and redness.  Respiratory:  Negative for cough, hemoptysis, sputum production, shortness of breath, wheezing and stridor.   Cardiovascular:  Negative for chest pain, palpitations, orthopnea, claudication, leg swelling and PND.  Gastrointestinal:  Positive for abdominal pain, diarrhea and heartburn. Negative for blood in stool, constipation, nausea and vomiting.  Genitourinary:  Negative for dysuria, flank pain, frequency, hematuria and urgency.  Musculoskeletal:  Negative for back pain, falls, joint pain, myalgias and neck pain.  Skin:  Negative for itching and rash.  Neurological:  Negative for dizziness, tingling, tremors, sensory change, speech change, focal weakness, seizures, loss of consciousness, weakness and headaches.  Endo/Heme/Allergies:  Negative for environmental allergies and polydipsia. Does not bruise/bleed easily.  Psychiatric/Behavioral:  Negative for depression, memory loss, substance abuse and suicidal ideas.  The patient is not nervous/anxious and does not have insomnia.    Abdominal pain is the primary positive review of systems     Objective:    BP 121/80   Pulse 89   Temp 98 F (36.7 C)   Ht 5\' 4"  (1.626 m)   Wt 245 lb (111.1 kg)   SpO2 100%   BMI 42.05 kg/m    Physical Exam Vitals reviewed.  Constitutional:      General: She is in acute distress.     Appearance: Normal appearance. She is well-developed. She is obese. She is not diaphoretic.  HENT:     Head: Normocephalic and atraumatic.     Nose: No nasal deformity, septal deviation, mucosal edema or rhinorrhea.     Right Sinus: No maxillary sinus tenderness or frontal sinus tenderness.     Left Sinus: No maxillary sinus tenderness or frontal sinus tenderness.     Mouth/Throat:     Pharynx: No oropharyngeal exudate.  Eyes:     General: No scleral icterus.    Conjunctiva/sclera: Conjunctivae normal.     Pupils: Pupils are equal, round, and reactive to light.  Neck:     Thyroid: No thyromegaly.     Vascular: No carotid bruit or JVD.     Trachea: Trachea normal. No tracheal tenderness or tracheal deviation.  Cardiovascular:     Rate and Rhythm: Normal rate and regular rhythm.     Chest Wall: PMI is not displaced.     Pulses: Normal pulses. No decreased pulses.     Heart sounds: Normal heart sounds, S1 normal and S2 normal. Heart sounds not distant. No  murmur heard.    No systolic murmur is present.     No diastolic murmur is present.     No friction rub. No gallop. No S3 or S4 sounds.  Pulmonary:     Effort: No tachypnea, accessory muscle usage or respiratory distress.     Breath sounds: No stridor. No decreased breath sounds, wheezing, rhonchi or rales.  Chest:     Chest wall: No tenderness.  Abdominal:     General: Bowel sounds are normal. There is no distension.     Palpations: Abdomen is soft. Abdomen is not rigid.     Tenderness: There is abdominal tenderness in the epigastric area and left lower quadrant. There  is guarding. There is no rebound.     Hernia: No hernia is present.  Musculoskeletal:        General: Normal range of motion.     Cervical back: Normal range of motion and neck supple. No edema, erythema or rigidity. No muscular tenderness. Normal range of motion.  Lymphadenopathy:     Head:     Right side of head: No submental or submandibular adenopathy.     Left side of head: No submental or submandibular adenopathy.     Cervical: No cervical adenopathy.  Skin:    General: Skin is warm and dry.     Coloration: Skin is not pale.     Findings: No rash.     Nails: There is no clubbing.  Neurological:     Mental Status: She is alert and oriented to person, place, and time.     Sensory: No sensory deficit.  Psychiatric:        Speech: Speech normal.        Behavior: Behavior normal.     Results for orders placed or performed during the hospital encounter of 11/01/22  Lipase, blood  Result Value Ref Range   Lipase 28 11 - 51 U/L  Comprehensive metabolic panel  Result Value Ref Range   Sodium 138 135 - 145 mmol/L   Potassium 3.5 3.5 - 5.1 mmol/L   Chloride 108 98 - 111 mmol/L   CO2 23 22 - 32 mmol/L   Glucose, Bld 182 (H) 70 - 99 mg/dL   BUN 8 6 - 20 mg/dL   Creatinine, Ser 4.09 0.44 - 1.00 mg/dL   Calcium 9.2 8.9 - 81.1 mg/dL   Total Protein 6.9 6.5 - 8.1 g/dL   Albumin 3.8 3.5 - 5.0 g/dL   AST 18 15 - 41 U/L   ALT 19 0 - 44 U/L   Alkaline Phosphatase 75 38 - 126 U/L   Total Bilirubin 0.9 0.3 - 1.2 mg/dL   GFR, Estimated >91 >47 mL/min   Anion gap 7 5 - 15  CBC  Result Value Ref Range   WBC 7.8 4.0 - 10.5 K/uL   RBC 4.72 3.87 - 5.11 MIL/uL   Hemoglobin 14.1 12.0 - 15.0 g/dL   HCT 82.9 56.2 - 13.0 %   MCV 89.4 80.0 - 100.0 fL   MCH 29.9 26.0 - 34.0 pg   MCHC 33.4 30.0 - 36.0 g/dL   RDW 86.5 78.4 - 69.6 %   Platelets 347 150 - 400 K/uL   nRBC 0.0 0.0 - 0.2 %  I-Stat beta hCG blood, ED  Result Value Ref Range   I-stat hCG, quantitative <5.0 <5 mIU/mL   Comment 3                 Assessment &  Plan:   Problem List Items Addressed This Visit       Other   Acute abdominal pain in left lower quadrant - Primary    Acute abdominal pain left lower quadrant with diarrhea Patient needs immediate imaging will not be able to get this on a rapid basis as an outpatient.  Patient is in some degree of distress and I feel needs emergency room evaluation.  Patient drove herself to the office and feels comfortable and I feel is safe for her to drive herself across the street to the emergency room.  I called ahead to notify the patient was coming by private vehicle.       No orders of the defined types were placed in this encounter.   No follow-ups on file.  Shan Levans, MD

## 2022-11-01 NOTE — ED Provider Notes (Signed)
[place] EMERGENCY DEPARTMENT AT [facility] Provider Note   CSN: [id number] Arrival date & time: 11/01/22  1016     History  Chief Complaint  Patient presents with   Back Pain   Abdominal Pain    Sarah Garrison is a 42 y.o. female.  Is a 42 year old female with a past medical history of hypertension and diabetes presenting to the emergency department with abdominal pain.  She states that she woke up around 1 AM with epigastric abdominal pain.  She states that she initially felt like she needed to use the bathroom and when she went to have a bowel movement became sweaty and short of breath.  She states that she felt nauseous but did not vomit.  She states that she was able to have a normal bowel movement but had no significant change in the pain.  She denies any diarrhea or constipation, black or bloody stools, dysuria or hematuria.  She states that she was seen by her primary doctor today who recommended that she come to the emergency department.  The history is provided by the patient.  Back Pain Associated symptoms: abdominal pain   Abdominal Pain      Home Medications Prior to Admission medications   Medication Sig Start Date End Date Taking? Authorizing Provider  famotidine (PEPCID) 20 MG tablet Take 1 tablet (20 mg total) by mouth daily. 11/01/22  Yes Theresia Lo, [place] K, DO  ondansetron (ZOFRAN) 4 MG tablet Take 1 tablet (4 mg total) by mouth every 6 (six) hours. 11/01/22  Yes Elayne Snare K, DO  simethicone (GAS-X) 80 MG chewable tablet Chew 1 tablet (80 mg total) by mouth every 6 (six) hours as needed for flatulence. 11/01/22  Yes Theresia Lo, Turkey K, DO  atorvastatin (LIPITOR) 40 MG tablet Take 1 tablet (40 mg total) by mouth daily. 05/31/22   Hoy Register, MD  cetirizine (ZYRTEC ALLERGY) 10 MG tablet Take 1 tablet (10 mg total) by mouth daily. 09/27/22   Rising, Lurena Joiner, PA-C  Continuous Blood Gluc Sensor (DEXCOM G6 SENSOR) MISC USE AND CHANGE  SENSOR EVERY 10 DAYS AS DIRECTED 03/14/22   [provider]  Continuous Blood Gluc Sensor (DEXCOM G6 SENSOR) MISC SMARTSIG:Topical Every 10 Days    [provider]  Continuous Blood Gluc Transmit (DEXCOM G6 TRANSMITTER) MISC USE TO CHECK BLOOD SUGAR EVERY DAY. CHANGE EVERY 3 MONTHS AS DIRECTED 03/14/22   [provider]  fluconazole (DIFLUCAN) 150 MG tablet Take 1 tablet (150 mg total) by mouth once as needed for up to 2 doses (take one pill on day 1, and the second pill 3 days later). Patient not taking: Reported on 10/22/2022 09/27/22   Rising, Lurena Joiner, PA-C  fluticasone [facility]) 50 MCG/ACT nasal spray Place 2 sprays into both nostrils daily. 09/27/22   Rising, Lurena Joiner, PA-C  insulin aspart (NOVOLOG FLEXPEN) 100 UNIT/ML FlexPen Inject into the skin 3 (three) times daily before meals. ONLY WHEN INSULIN PUMP MALFUNCTION'S PER ENDOCRINOLOGY DIRECTION    [provider]  insulin aspart (NOVOLOG) 100 UNIT/ML injection USE VIA INSULIN PUMP. MAX TOTAL DAILY DOSE OF 200 UNITS 11/13/21     insulin aspart (NOVOLOG) 100 UNIT/ML injection USE VIA INSULIN PUMP. MAX TOTAL DAILY DOSE OF 250 UNITS 07/30/22     Insulin Human (INSULIN PUMP) SOLN Inject 1 each into the skin See admin instructions. Medication: Novolog 100 units/ml injection. Takes 100 units via pump per patient.    [provider]  Insulin Syringe-Needle U-100 31G X 5/16"  1 ML MISC inject 10 units into the skin 3 times daily. use to inject novolog based on insulin to card ratio- max daily dose 150 units 09/23/20     meloxicam (MOBIC) 7.5 MG tablet Take 1 tablet (7.5 mg total) by mouth daily. 10/22/22   Crain, Whitney L, PA  olopatadine (PATANOL) 0.1 % ophthalmic solution Place 1 drop into both eyes 2 (two) times daily. 09/27/22   Rising, Rebecca, PA-C  OZEMPIC, 0.25 OR 0.5 MG/DOSE, 2 MG/3ML SOPN Inject into the skin. 06/13/22   [provider]  pantoprazole (PROTONIX) 40 MG tablet Take 1 tablet (40 mg total)  by mouth daily. 10/23/22   Hoy Register, MD  triamcinolone cream (KENALOG) 0.1 % Apply 1 Application topically 2 (two) times daily. 10/30/22   Hoy Register, MD  ARIPiprazole (ABILIFY) 5 MG tablet Take 1 tablet (5 mg total) by mouth daily. 10/27/19 06/13/20  Sater, Pearletha Furl, MD      Allergies    Morphine and codeine, Glyburide, Hydrocodone, Ivp dye [iodinated contrast media], and Oxycodone    Review of Systems   Review of Systems  Gastrointestinal:  Positive for abdominal pain.  Musculoskeletal:  Positive for back pain.    Physical Exam Updated Vital Signs BP (!) 152/105   Pulse 81   Temp 98 F (36.7 C) (Oral)   Resp 15   SpO2 100%  Physical Exam Vitals and nursing note reviewed.  Constitutional:      General: She is not in acute distress.    Appearance: She is well-developed.  HENT:     Head: Normocephalic and atraumatic.     Mouth/Throat:     Mouth: Mucous membranes are moist.     Pharynx: Oropharynx is clear.  Eyes:     Extraocular Movements: Extraocular movements intact.  Cardiovascular:     Rate and Rhythm: Normal rate and regular rhythm.     Heart sounds: Normal heart sounds.  Pulmonary:     Effort: Pulmonary effort is normal.     Breath sounds: Normal breath sounds.  Abdominal:     General: Abdomen is flat.     Palpations: Abdomen is soft.     Tenderness: There is abdominal tenderness in the epigastric area. There is no guarding or rebound.  Skin:    General: Skin is warm and dry.  Neurological:     General: No focal deficit present.     Mental Status: She is alert and oriented to person, place, and time.  Psychiatric:        Mood and Affect: Mood normal.        Behavior: Behavior normal.     ED Results / Procedures / Treatments   Labs (all labs ordered are listed, but only abnormal results are displayed) Labs Reviewed  COMPREHENSIVE METABOLIC PANEL - Abnormal; Notable for the following components:      Result Value   Glucose, Bld 182 (*)    All  other components within normal limits  LIPASE, BLOOD  CBC  I-STAT BETA HCG BLOOD, ED ([facility], [facility], [facility] ONLY)    EKG EKG Interpretation  Date/Time:  Thursday Nov 01 2022 12:00:44 EDT Ventricular Rate:  85 PR Interval:  176 QRS Duration: 92 QT Interval:  371 QTC Calculation: 442 R Axis:   70 Text Interpretation: Sinus rhythm No significant change since last tracing Confirmed by Elayne Snare ([id number]) on 11/01/2022 12:02:13 PM  Radiology US Abdomen Limited RUQ (LIVER/GB)  Result Date: 11/01/2022 CLINICAL DATA:  Right upper quadrant discomfort  EXAM: ULTRASOUND ABDOMEN LIMITED RIGHT UPPER QUADRANT COMPARISON:  CT renal stone protocol February 16, 2018 FINDINGS: Gallbladder: No gallstones or wall thickening visualized. No sonographic Murphy sign noted by sonographer. Common bile duct: Diameter: 4 mm Liver: No focal lesion identified. Within normal limits in parenchymal echogenicity. Portal vein is patent on color Doppler imaging with normal direction of blood flow towards the liver. Other: None. IMPRESSION: No etiology for abdominal pain identified. Electronically Signed   By: Jacob Moores M.D.   On: 11/01/2022 13:59    Procedures Procedures    Medications Ordered in ED Medications  alum & mag hydroxide-simeth (MAALOX/MYLANTA) 200-200-20 MG/5ML suspension 30 mL (has no administration in time range)  lactated ringers bolus 1,000 mL (0 mLs Intravenous Stopped 11/01/22 1324)  famotidine (PEPCID) IVPB 20 mg premix (0 mg Intravenous Stopped 11/01/22 1324)  ondansetron (ZOFRAN) injection 4 mg (4 mg Intravenous Given 11/01/22 1202)  fentaNYL (SUBLIMAZE) injection 50 mcg (50 mcg Intravenous Given 11/01/22 1203)    ED Course/ Medical Decision Making/ A&P                             Medical Decision Making This patient presents to the ED with chief complaint(s) of epigastric pain with pertinent past medical history of HTN, DM which further complicates the presenting complaint. The complaint  involves an extensive differential diagnosis and also carries with it a high risk of complications and morbidity.    The differential diagnosis includes gastritis, GERD, pancreatitis, hepatitis, gastroparesis, hyperglycemic crisis, atypical ACS, dehydration, electrolyte abnormality, cholelithiasis, cholecystitis  Additional history obtained: Additional history obtained from N/A Records reviewed outpatient GI records, primary care records  ED Course and Reassessment: On patient's arrival to the emergency department she is hemodynamically stable in no acute distress though does have significant epigastric tenderness to palpation.  Patient will have labs including LFTs and lipase, EKG and right upper quadrant ultrasound to further evaluate for cause of her symptoms.  She will be symptomatically treated with GI cocktail, fluids and fentanyl and will be closely reassessed.  Independent labs interpretation:  The following labs were independently interpreted: Within normal range  Independent visualization of imaging: - I independently visualized the following imaging with scope of interpretation limited to determining acute life threatening conditions related to emergency care: Right upper quadrant ultrasound, which revealed no acute disease  Consultation: - Consulted or discussed management/test interpretation w/ external professional: N/A  Consideration for admission or further workup: Patient has no emergent conditions requiring admission or further work-up at this time and is stable for discharge home with primary care follow-up  Social Determinants of health: N.A    Amount and/or Complexity of Data Reviewed Labs: ordered. Radiology: ordered.  Risk Prescription drug management.          Final Clinical Impression(s) / ED Diagnoses Final diagnoses:  Epigastric pain    Rx / DC Orders ED Discharge Orders          Ordered    famotidine (PEPCID) 20 MG tablet  Daily         11/01/22 1436    simethicone (GAS-X) 80 MG chewable tablet  Every 6 hours PRN        11/01/22 1436    ondansetron (ZOFRAN) 4 MG tablet  Every 6 hours        11/01/22 1436              Rexford Maus, DO 11/01/22 1438

## 2022-11-09 ENCOUNTER — Ambulatory Visit: Payer: Medicaid Other

## 2022-11-14 ENCOUNTER — Encounter (HOSPITAL_COMMUNITY): Payer: Self-pay

## 2022-11-14 ENCOUNTER — Ambulatory Visit (HOSPITAL_COMMUNITY)
Admission: RE | Admit: 2022-11-14 | Discharge: 2022-11-14 | Disposition: A | Discharge status: Home / Self Care | Payer: Medicaid Other | Source: Ambulatory Visit | Attending: Internal Medicine | Admitting: Internal Medicine

## 2022-11-14 ENCOUNTER — Other Ambulatory Visit: Payer: Self-pay

## 2022-11-14 VITALS — BP 158/92 | HR 100 | Temp 98.7°F | Resp 18

## 2022-11-14 DIAGNOSIS — S39011A Strain of muscle, fascia and tendon of abdomen, initial encounter: Secondary | ICD-10-CM | POA: Diagnosis not present

## 2022-11-14 DIAGNOSIS — S39012A Strain of muscle, fascia and tendon of lower back, initial encounter: Secondary | ICD-10-CM | POA: Diagnosis not present

## 2022-11-14 MED ORDER — KETOROLAC TROMETHAMINE 30 MG/ML IJ SOLN
INTRAMUSCULAR | Status: AC
  Filled 2022-11-14: qty 1

## 2022-11-14 MED ORDER — KETOROLAC TROMETHAMINE 30 MG/ML IJ SOLN
30.0000 mg | Freq: Once | INTRAMUSCULAR | Status: AC
  Administered 2022-11-14: 30 mg via INTRAMUSCULAR

## 2022-11-14 MED ORDER — METHOCARBAMOL 500 MG PO TABS
500.0000 mg | ORAL_TABLET | Freq: Two times a day (BID) | ORAL | 0 refills | Status: DC
  Filled 2022-11-14: qty 20, 10d supply, fill #0

## 2022-11-14 NOTE — ED Triage Notes (Signed)
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 She did start with diarrhea yesterday. She has taken tylenol and imodium.

## 2022-11-14 NOTE — ED Provider Notes (Signed)
[facility]    CSN: [id number] Arrival date & time: 11/14/22  4098      History   Chief Complaint Chief Complaint  Patient presents with   Abdominal Pain    Entered by patient   Diarrhea    HPI Sarah Garrison is a 42 y.o. female.   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  Heavy physical work as a Lawyer for this patient causes a lot of low back pain and she reports significant soreness that is starting to affect her abdominal muscles as well.   Denies urinary symptoms, urinary/stool incontinence, numbness/tingling to the bilateral lower extremities, saddle anesthesia symptoms, weakness, headache, blurry vision, dizziness, and fever/chills. No constipation, nausea, vomiting, or blood/mucous to the stools reported. No recent falls, injuries, or trauma to the area of greatest tenderness. Pain to the low back and the abdomen is much worse with movement and is severe after she gets off of work.  She has been taking Tylenol as needed for pain without much relief.  She is unable to take NSAIDs currently as they have been "upsetting her stomach".   Abdominal Pain Associated symptoms: diarrhea   Diarrhea Associated symptoms: abdominal pain     Past Medical History:  Diagnosis Date   Anxiety    Asthma    Chronic constipation    Chronic shortness of breath    Depression    GERD (gastroesophageal reflux disease)    History of COVID-19 09/29/2019   09-28-2020 result in epic; hospital admission 10-05-2019 for covid pneumonia with hypoxic acute respiratory failure, no oxygen needed when discharged 10-06-2019;  and positive 06-13-2020 result in epic, pt stated no symptoms   History of seizures     (05-04-2021  pt stated was told during hospital admission 05/ 2021 with covid , she had 2 seizures,  stated no medication and no seizure since)   Hypertension    Insulin dependent type 2 diabetes mellitus [facility]    endocrinologist-- Fredia Sorrow NP;   pt uses insulin pump   Insulin pump in place    Mixed hyperlipidemia    OSA (obstructive sleep apnea)    ( 05-04-2021 pt stated has not used cpap since 05/ 2021) followed by dr byrum;  study in epic 02-16-2019 moderate , titrated cpap 08-28-2019   Plantar fasciitis, right    w/ chronic pain    Patient Active Problem List   Diagnosis Date Noted   Acute abdominal pain in left lower quadrant 11/01/2022   Left tennis elbow 08/24/2022   Pain in both knees 08/24/2022   Neurogenic pain of right foot 08/24/2022   Pain of right lower extremity 07/26/2022   Palpitations 07/31/2021   Fatigue 07/31/2021   Primary hypertension 07/31/2021   Hip pain 12/08/2019   Hyperlipidemia associated with type 2 diabetes mellitus (HCC) 11/23/2019   History of COVID-19 10/27/2019   Stuttering 10/27/2019   Depression with anxiety 10/27/2019   Obesity, Class III, BMI 40-49.9 (morbid obesity) (HCC) 06/30/2019   OSA (obstructive sleep apnea) 02/16/2019   Essential hypertension 06/22/2018   Type 2 diabetes mellitus with hyperosmolarity without coma, with long-term current use of insulin (HCC) 12/13/2016  Asthma 07/30/2016   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/07/2016   DM neuropathy, type II diabetes mellitus (HCC) 08/24/2015    Past Surgical History:  Procedure Laterality Date   CESAREAN SECTION  1999   PLANTAR FASCIA RELEASE Right 05/08/2021   Procedure: ENDOSCOPIC PLANTAR FASCIOTOMY;  Surgeon: Asencion Islam, DPM;  Location: [facility];  Service: Podiatry;  Laterality: Right;  WITH BLOCK   STERIOD INJECTION Bilateral 05/08/2021   Procedure: PLATELET RICH PLASMA INJECTION HEELS;  Surgeon: Asencion Islam, DPM;  Location: [facility];   Service: Podiatry;  Laterality: Bilateral;   TUBAL LIGATION Bilateral 09/29/2006   @[facility] ;   [facility]   VENTRAL HERNIA REPAIR N/A 11/27/2017   Procedure: VENTRAL HERNIA REPAIR ERAS PATHWAY;  Surgeon: Harriette Bouillon, MD;  Location: [facility];  Service: General;  Laterality: N/A;   WISDOM TOOTH EXTRACTION      OB History     Gravida  5   Para  4   Term      Preterm      AB  1   Living  4      SAB  1   IAB      Ectopic      Multiple      Live Births               Home Medications    Prior to Admission medications   Medication Sig Start Date End Date Taking? Authorizing Provider  atorvastatin (LIPITOR) 40 MG tablet Take 1 tablet (40 mg total) by mouth daily. 05/31/22  Yes Newlin, Odette Horns, MD  Continuous Blood Gluc Sensor (DEXCOM G6 SENSOR) MISC USE AND CHANGE SENSOR EVERY 10 DAYS AS DIRECTED 03/14/22  Yes [provider]  Continuous Blood Gluc Sensor (DEXCOM G6 SENSOR) MISC SMARTSIG:Topical Every 10 Days   Yes [provider]  Continuous Blood Gluc Transmit (DEXCOM G6 TRANSMITTER) MISC USE TO CHECK BLOOD SUGAR EVERY DAY. CHANGE EVERY 3 MONTHS AS DIRECTED 03/14/22  Yes [provider]  famotidine (PEPCID) 20 MG tablet Take 1 tablet (20 mg total) by mouth daily. 11/01/22  Yes Theresia Lo, [place] K, DO  insulin aspart (NOVOLOG) 100 UNIT/ML injection USE VIA INSULIN PUMP. MAX TOTAL DAILY DOSE OF 200 UNITS 11/13/21  Yes   Insulin Human (INSULIN PUMP) SOLN Inject 1 each into the skin See admin instructions. Medication: Novolog 100 units/ml injection. Takes 100 units via pump per patient.   Yes [provider]  methocarbamol (ROBAXIN) 500 MG tablet Take 1 tablet (500 mg total) by mouth 2 (two) times daily. 11/14/22  Yes Carlisle Beers, FNP  olopatadine (PATANOL) 0.1 % ophthalmic solution Place 1 drop into both eyes 2 (two) times daily. 09/27/22  Yes Rising, Rebecca, PA-C  OZEMPIC, 0.25 OR 0.5 MG/DOSE, 2 MG/3ML SOPN Inject into  the skin. 06/13/22  Yes [provider]  pantoprazole (PROTONIX) 40 MG tablet Take 1 tablet (40 mg total) by mouth daily. 10/23/22  Yes Hoy Register, MD  cetirizine (ZYRTEC ALLERGY) 10 MG tablet Take 1 tablet (10 mg total) by mouth daily. 09/27/22   Rising, Lurena Joiner, PA-C  fluconazole (DIFLUCAN) 150 MG tablet Take 1 tablet (150 mg total) by mouth once as needed for up to 2 doses (take one pill on day 1, and the second pill 3 days later). Patient not taking: Reported on 10/22/2022 09/27/22   Rising, Lurena Joiner, PA-C  fluticasone [facility]) 50 MCG/ACT nasal spray Place 2 sprays into both nostrils daily. 09/27/22   Rising, Lurena Joiner, PA-C  insulin aspart (NOVOLOG FLEXPEN) 100 UNIT/ML FlexPen Inject into the skin 3 (three) times daily before meals. ONLY WHEN INSULIN PUMP MALFUNCTION'S PER ENDOCRINOLOGY DIRECTION    [provider]  insulin aspart (NOVOLOG) 100 UNIT/ML injection USE VIA INSULIN PUMP. MAX TOTAL DAILY DOSE OF 250 UNITS 07/30/22     Insulin Syringe-Needle U-100 31G X 5/16" 1 ML MISC inject 10 units into the skin 3 times daily. use to inject novolog based on insulin to card ratio- max daily dose 150 units 09/23/20     meloxicam (MOBIC) 7.5 MG tablet Take 1 tablet (7.5 mg total) by mouth daily. 10/22/22   Crain, Whitney L, PA  ondansetron (ZOFRAN) 4 MG tablet Take 1 tablet (4 mg total) by mouth every 6 (six) hours. 11/01/22   Rexford Maus, DO  simethicone (GAS-X) 80 MG chewable tablet Chew 1 tablet (80 mg total) by mouth every 6 (six) hours as needed for flatulence. 11/01/22   Elayne Snare K, DO  triamcinolone cream (KENALOG) 0.1 % Apply 1 Application topically 2 (two) times daily. 10/30/22   Hoy Register, MD  ARIPiprazole (ABILIFY) 5 MG tablet Take 1 tablet (5 mg total) by mouth daily. 10/27/19 06/13/20  Sater, Pearletha Furl, MD    Family History Family History  Problem Relation Age of Onset   Asthma Mother    Kidney failure Mother    Brain cancer Mother    Asthma Father     Other Father        surgery on stomach but don't know from what   Breast cancer Maternal Grandmother    Diabetes Maternal Grandmother    Diabetes Brother     Social History Social History   Tobacco Use   Smoking status: Former    Packs/day: 0.10    Years: 2.00    Additional pack years: 0.00    Total pack years: 0.20    Types: Cigarettes    Quit date: 11/27/2017    Years since quitting: 4.9   Smokeless tobacco: Never  Vaping Use   Vaping Use: Former   Devices: Elfvar  (05-04-2021 per pt last vaped approx 2 wks ago)  Substance Use Topics   Alcohol use: No   Drug use: Never     Allergies   Morphine and codeine, Glyburide, Hydrocodone, Ivp dye [iodinated contrast media], and Oxycodone   Review of Systems Review of Systems  Gastrointestinal:  Positive for abdominal pain and diarrhea.  Per HPI   Physical Exam Triage Vital Signs ED Triage Vitals  Enc Vitals Group     BP 11/14/22 0959 (!) 158/92     Pulse Rate 11/14/22 0959 100     Resp 11/14/22 0959 18     Temp 11/14/22 0959 98.7 F (37.1 C)     Temp Source 11/14/22 0959 Oral     SpO2 11/14/22 0959 98 %     Weight --      Height --      Head Circumference --      Peak Flow --      Pain Score 11/14/22 0956 8     Pain Loc --      Pain Edu? --      Excl. in GC? --    No data found.  Updated Vital Signs BP (!) 158/92 (BP Location: Left Arm)   Pulse 100   Temp 98.7 F (37.1 C) (Oral)   Resp 18   SpO2 98%   Visual Acuity Right Eye Distance:   Left  Eye Distance:   Bilateral Distance:    Right Eye Near:   Left Eye Near:    Bilateral Near:     Physical Exam Vitals and nursing note reviewed.  Constitutional:      Appearance: She is obese. She is not ill-appearing or toxic-appearing.  HENT:     Head: Normocephalic and atraumatic.     Right Ear: Hearing and external ear normal.     Left Ear: Hearing and external ear normal.     Nose: Nose normal.     Mouth/Throat:     Lips: Pink.  Eyes:      General: Lids are normal. Vision grossly intact. Gaze aligned appropriately.     Extraocular Movements: Extraocular movements intact.     Conjunctiva/sclera: Conjunctivae normal.  Cardiovascular:     Rate and Rhythm: Normal rate and regular rhythm.     Heart sounds: Normal heart sounds, S1 normal and S2 normal.  Pulmonary:     Effort: Pulmonary effort is normal. No respiratory distress.     Breath sounds: Normal breath sounds and air entry.  Abdominal:     General: Abdomen is flat. Bowel sounds are normal.     Palpations: Abdomen is soft.     Tenderness: There is generalized abdominal tenderness. There is no right CVA tenderness, left CVA tenderness or guarding.  Musculoskeletal:     Cervical back: Normal and neck supple.     Thoracic back: Normal.     Lumbar back: Tenderness (TTP to the bilateral lumbar paraspinals) present. No swelling, edema, deformity, signs of trauma, lacerations, spasms or bony tenderness. Normal range of motion. Negative right straight leg raise test and negative left straight leg raise test. No scoliosis.  Skin:    General: Skin is warm and dry.     Capillary Refill: Capillary refill takes less than 2 seconds.     Findings: No rash.  Neurological:     General: No focal deficit present.     Mental Status: She is alert and oriented to person, place, and time. Mental status is at baseline.     Cranial Nerves: No dysarthria or facial asymmetry.  Psychiatric:        Mood and Affect: Mood normal.        Speech: Speech normal.        Behavior: Behavior normal.        Thought Content: Thought content normal.        Judgment: Judgment normal.      UC Treatments / Results  Labs (all labs ordered are listed, but only abnormal results are displayed) Labs Reviewed - No data to display  EKG   Radiology No results found.  Procedures Procedures (including critical care time)  Medications Ordered in UC Medications  ketorolac (TORADOL) 30 MG/ML injection 30  mg (has no administration in time range)    Initial Impression / Assessment and Plan / UC Course  I have reviewed the triage vital signs and the nursing notes.  Pertinent labs & imaging results that were available during my care of the patient were reviewed by me and considered in my medical decision making (see chart for details).   1. Strain of abdominal muscle, strain of lumbar region Presentation is consistent with acute muscle strain of the abdominal muscles and the lumbar spine that will likely resolve with rest, fluids, as needed use of Tylenol and muscle relaxer, heat, and gentle range of motion exercises. May take Tylenol every 6 hours and Robaxin muscle  relaxer every 12 hours as needed for muscle spasm. Drowsiness precautions regarding muscle relaxer use discussed.  She is not a candidate for oral NSAID use due to acid reflux and GERD.  Therefore, ketorolac 30 mg IM given in clinic.  Heat and gentle ROM exercises discussed. Deferred imaging today based on stable musculoskeletal exam findings and hemodynamically stable vital signs. Walking referral given to orthopedic provider should symptoms fail to improve in the next 1-2 weeks.   Discussed physical exam and available lab work findings in clinic with patient.  Counseled patient regarding appropriate use of medications and potential side effects for all medications recommended or prescribed today. Discussed red flag signs and symptoms of worsening condition,when to call the PCP office, return to urgent care, and when to seek higher level of care in the emergency department. Patient verbalizes understanding and agreement with plan. All questions answered. Patient discharged in stable condition.    Final Clinical Impressions(s) / UC Diagnoses   Final diagnoses:  Strain of abdominal muscle, initial encounter  Strain of lumbar region, initial encounter     Discharge Instructions      Your pain is likely due to a muscle strain which  will improve on its own with time.   - Take tylenol with food every 6 hours as needed for pain and inflammation. - Ketorolac shot in clinic to reduce inflammation. - You may also take the prescribed muscle relaxer as directed as needed for muscle aches/spasm.  Do not take this medication and drive or drink alcohol as it can make you sleepy.  Mainly use this medicine at nighttime as needed. - Apply heat 20 minutes on then 20 minutes off and perform gentle range of motion exercises to the area of greatest pain to prevent muscle stiffness and provide further pain relief.   Red flag symptoms to watch out for are numbness/tingling to the legs, weakness, loss of bowel/bladder control, and/or worsening pain that does not respond well to medicines. Follow-up with your primary care provider or return to urgent care if your symptoms do not improve in the next 3 to 4 days with medications and interventions recommended today. If your symptoms are severe (red flag), please go to the emergency room.  I hope you feel better!       ED Prescriptions     Medication Sig Dispense Auth. Provider   methocarbamol (ROBAXIN) 500 MG tablet Take 1 tablet (500 mg total) by mouth 2 (two) times daily. 20 tablet Carlisle Beers, FNP      PDMP not reviewed this encounter.   Carlisle Beers, [place] 11/14/22 1103

## 2022-11-14 NOTE — Discharge Instructions (Signed)
Your pain is likely due to a muscle strain which will improve on its own with time.   - Take tylenol with food every 6 hours as needed for pain and inflammation. - Ketorolac shot in clinic to reduce inflammation. - You may also take the prescribed muscle relaxer as directed as needed for muscle aches/spasm.  Do not take this medication and drive or drink alcohol as it can make you sleepy.  Mainly use this medicine at nighttime as needed. - Apply heat 20 minutes on then 20 minutes off and perform gentle range of motion exercises to the area of greatest pain to prevent muscle stiffness and provide further pain relief.   Red flag symptoms to watch out for are numbness/tingling to the legs, weakness, loss of bowel/bladder control, and/or worsening pain that does not respond well to medicines. Follow-up with your primary care provider or return to urgent care if your symptoms do not improve in the next 3 to 4 days with medications and interventions recommended today. If your symptoms are severe (red flag), please go to the emergency room.  I hope you feel better!

## 2022-11-20 ENCOUNTER — Other Ambulatory Visit: Payer: Self-pay

## 2022-11-24 ENCOUNTER — Telehealth (HOSPITAL_BASED_OUTPATIENT_CLINIC_OR_DEPARTMENT_OTHER): Payer: Medicaid Other | Admitting: Family

## 2022-11-24 ENCOUNTER — Encounter: Payer: Self-pay | Admitting: Family

## 2022-11-24 DIAGNOSIS — M549 Dorsalgia, unspecified: Secondary | ICD-10-CM | POA: Diagnosis not present

## 2022-11-24 DIAGNOSIS — S39011D Strain of muscle, fascia and tendon of abdomen, subsequent encounter: Secondary | ICD-10-CM | POA: Diagnosis not present

## 2022-11-24 NOTE — Progress Notes (Signed)
I connected with  Sarah Garrison on 11/24/22 by a video enabled telemedicine application and verified that I am speaking with the correct person using two identifiers.   I discussed the limitations of evaluation and management by telemedicine. The patient expressed understanding and agreed to proceed.   Patient has been counseled on age-appropriate routine health concerns for screening and prevention. These are reviewed and up-to-date. Referrals have been placed accordingly. Immunizations are up-to-date or declined.      Subjective:  Patient is a 42 year old female seen this morning via video visit for follow-up for abdominal and back pain. Patient was seen at a local urgent care on November 14, 2022 with pain to her lower back radiating to the abdomen for about 1 to 2 weeks.  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 She was discharged with instructions to take Tylenol and was prescribed Robaxin to take as needed.  Patient says she has been using Tylenol only and her pain is completely gone.  She did not take her Robaxin because she was afraid the medication could affect her work. Patient denies back pain, abdominal pain, diarrhea, incontinence of urine, chest pain, shortness of breath, or any other symptoms today.  During discharge from the urgent care patient was given Ortho referral order, patient says she has not been contacted to schedule the visit yet.  Review of Systems  Respiratory: Negative.    Cardiovascular: Negative.   Gastrointestinal: Negative.   Genitourinary: Negative.   Musculoskeletal: Negative.   Neurological: Negative.     Past Medical History:  Diagnosis Date   Anxiety    Asthma    Chronic constipation    Chronic shortness of breath    Depression    GERD (gastroesophageal reflux disease)    History of COVID-19 09/29/2019   09-28-2020 result in epic; hospital admission 10-05-2019 for covid  pneumonia with hypoxic acute respiratory failure, no oxygen needed when discharged 10-06-2019;  and positive 06-13-2020 result in epic, pt stated no symptoms   History of seizures    (05-04-2021  pt stated was told during hospital admission 05/ 2021 with covid , she had 2 seizures,  stated no medication and no seizure since)   Hypertension    Insulin dependent type 2 diabetes mellitus [facility])    endocrinologist-- Fredia Sorrow NP;   pt uses insulin pump   Insulin pump in place    Mixed hyperlipidemia    OSA (obstructive sleep apnea)    ( 05-04-2021 pt stated has not used cpap since 05/ 2021) followed by dr byrum;  study in epic 02-16-2019 moderate , titrated cpap 08-28-2019   Plantar fasciitis, right    w/ chronic pain    Past Surgical History:  Procedure Laterality Date   CESAREAN SECTION  1999   PLANTAR FASCIA RELEASE Right 05/08/2021   Procedure: ENDOSCOPIC PLANTAR FASCIOTOMY;  Surgeon: Asencion Islam, DPM;  Location: [facility];  Service: Podiatry;  Laterality: Right;  WITH BLOCK   STERIOD INJECTION Bilateral 05/08/2021   Procedure: PLATELET RICH PLASMA INJECTION HEELS;  Surgeon: Asencion Islam, DPM;  Location: [facility];  Service: Podiatry;  Laterality: Bilateral;   TUBAL LIGATION Bilateral 09/29/2006   @[facility] ;   [facility]   VENTRAL HERNIA REPAIR N/A 11/27/2017   Procedure: VENTRAL HERNIA REPAIR ERAS PATHWAY;  Surgeon: Harriette Bouillon, MD;  Location: [facility];  Service: General;  Laterality: N/A;   WISDOM TOOTH EXTRACTION  Family History  Problem Relation Age of Onset   Asthma Mother    Kidney failure Mother    Brain cancer Mother    Asthma Father    Other Father        surgery on stomach but don't know from what   Breast cancer Maternal Grandmother    Diabetes Maternal Grandmother    Diabetes Brother     Social History Reviewed with no changes to be made today.   Outpatient Medications Prior to Visit  Medication Sig  Dispense Refill   atorvastatin (LIPITOR) 40 MG tablet Take 1 tablet (40 mg total) by mouth daily. 90 tablet 1   cetirizine (ZYRTEC ALLERGY) 10 MG tablet Take 1 tablet (10 mg total) by mouth daily. 30 tablet 2   Continuous Blood Gluc Sensor (DEXCOM G6 SENSOR) MISC USE AND CHANGE SENSOR EVERY 10 DAYS AS DIRECTED     Continuous Blood Gluc Sensor (DEXCOM G6 SENSOR) MISC SMARTSIG:Topical Every 10 Days     Continuous Blood Gluc Transmit (DEXCOM G6 TRANSMITTER) MISC USE TO CHECK BLOOD SUGAR EVERY DAY. CHANGE EVERY 3 MONTHS AS DIRECTED     famotidine (PEPCID) 20 MG tablet Take 1 tablet (20 mg total) by mouth daily. 30 tablet 0   fluconazole (DIFLUCAN) 150 MG tablet Take 1 tablet (150 mg total) by mouth once as needed for up to 2 doses (take one pill on day 1, and the second pill 3 days later). (Patient not taking: Reported on 10/22/2022) 2 tablet 0   fluticasone (FLONASE) 50 MCG/ACT nasal spray Place 2 sprays into both nostrils daily. 16 g 2   insulin aspart (NOVOLOG FLEXPEN) 100 UNIT/ML FlexPen Inject into the skin 3 (three) times daily before meals. ONLY WHEN INSULIN PUMP MALFUNCTION'S PER ENDOCRINOLOGY DIRECTION     insulin aspart (NOVOLOG) 100 UNIT/ML injection USE VIA INSULIN PUMP. MAX TOTAL DAILY DOSE OF 200 UNITS 90 mL 5   insulin aspart (NOVOLOG) 100 UNIT/ML injection USE VIA INSULIN PUMP. MAX TOTAL DAILY DOSE OF 250 UNITS 100 mL 5   Insulin Human (INSULIN PUMP) SOLN Inject 1 each into the skin See admin instructions. Medication: Novolog 100 units/ml injection. Takes 100 units via pump per patient.     Insulin Syringe-Needle U-100 31G X 5/16" 1 ML MISC inject 10 units into the skin 3 times daily. use to inject novolog based on insulin to card ratio- max daily dose 150 units 100 each 2   meloxicam (MOBIC) 7.5 MG tablet Take 1 tablet (7.5 mg total) by mouth daily. 7 tablet 0   methocarbamol (ROBAXIN) 500 MG tablet Take 1 tablet (500 mg total) by mouth 2 (two) times daily. 20 tablet 0   olopatadine  (PATANOL) 0.1 % ophthalmic solution Place 1 drop into both eyes 2 (two) times daily. 5 mL 0   ondansetron (ZOFRAN) 4 MG tablet Take 1 tablet (4 mg total) by mouth every 6 (six) hours. 12 tablet 0   OZEMPIC, 0.25 OR 0.5 MG/DOSE, 2 MG/3ML SOPN Inject into the skin.     pantoprazole (PROTONIX) 40 MG tablet Take 1 tablet (40 mg total) by mouth daily. 30 tablet 3   simethicone (GAS-X) 80 MG chewable tablet Chew 1 tablet (80 mg total) by mouth every 6 (six) hours as needed for flatulence. 30 tablet 0   triamcinolone cream (KENALOG) 0.1 % Apply 1 Application topically 2 (two) times daily. 30 g 0   No facility-administered medications prior to visit.    Allergies  Allergen Reactions   Morphine  And Codeine Shortness Of Breath   Glyburide Diarrhea and Other (See Comments)    Reaction:  Nose bleeds    Hydrocodone Other (See Comments)    Pt states that this medication makes her "dream about rabbits chasing her"   Ivp Dye [Iodinated Contrast Media] Other (See Comments)    Shortness of breath.     Oxycodone Other (See Comments)    Pt states that this medication makes her "dream about rabbits chasing" her.         Objective:    There were no vitals taken for this visit. Wt Readings from Last 3 Encounters:  11/01/22 245 lb (111.1 kg)  10/30/22 250 lb 3.2 oz (113.5 kg)  08/22/22 250 lb 6.4 oz (113.6 kg)    Physical Exam Constitutional:      Appearance: Normal appearance.  Neurological:     Mental Status: She is alert and oriented to person, place, and time.  Psychiatric:        Mood and Affect: Mood normal.        Behavior: Behavior normal.     Assessment & Plan:  Patient is a 42 year old female who was recently seen at a local urgent care with pain to her abdomen and lower back.  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  She used Tylenol and heat packs and has symptoms resolved.  She was given Ortho referral and they will contact to  follow-up with.  Plan: 1) strain of abdominal muscle, subsequent encounter:  -Patient reminded to use good body mechanics.  -Patient to take Tylenol as needed for pain.  -Reminded to report new or worsening symptoms to the clinic or local emergency room.  -Also reminded to contact Ortho as indicated on discharge papers.  2) back pain, unspecified back location, unspecified back pain laterality, unspecified chronicity:  -Patient reminded to use good body mechanics.  -Patient to take Tylenol as needed for pain.  -Reminded to report new or worsening symptoms to the clinic or local emergency room.  -Also reminded to contact Ortho as indicated on discharge papers.   Patient has been counseled extensively about nutrition and exercise as well as the importance of adherence with medications and regular follow-up. The patient was given clear instructions to go to ER or return to medical center if symptoms don't improve, worsen or new problems develop. The patient verbalized understanding.    Follow-up: Return in about 3 months (around 02/24/2023).     Eleonore Chiquito, FNP  [facility] [place], [place] [phone]   11/24/2022, 12:35 PM

## 2022-11-24 NOTE — Patient Instructions (Addendum)
1) use body mechanics, avoid twisting when lifting heavy loads. 2) report new or worsening symptoms to the clinic or local emergency room. 3) follow-up with your primary care provider in 3 to 6 months. 4) you were referred to Ortho, if you do not receive any call from the department, give them a call in the next 7 days, use the phone number provided on the discharge papers from emergency room. 5) for pain take Tylenol as needed and apply heat pads as needed.

## 2022-11-27 ENCOUNTER — Other Ambulatory Visit: Payer: Self-pay

## 2022-11-27 MED ORDER — INSULIN GLARGINE SOLOSTAR 300 UNIT/ML ~~LOC~~ SOPN
70.0000 [IU] | PEN_INJECTOR | Freq: Every day | SUBCUTANEOUS | 1 refills | Status: AC
  Filled 2022-11-27: qty 9, 39d supply, fill #0

## 2022-11-27 MED ORDER — INSULIN ASPART 100 UNIT/ML IJ SOLN
INTRAMUSCULAR | 5 refills | Status: DC
  Filled 2022-11-27: qty 90, 30d supply, fill #0
  Filled 2023-02-28: qty 80, 27d supply, fill #0

## 2022-11-28 ENCOUNTER — Other Ambulatory Visit: Payer: Self-pay

## 2022-11-29 ENCOUNTER — Other Ambulatory Visit: Payer: Medicaid Other

## 2022-11-30 ENCOUNTER — Ambulatory Visit
Admission: RE | Admit: 2022-11-30 | Discharge: 2022-11-30 | Disposition: A | Discharge status: Home / Self Care | Payer: Medicaid Other | Source: Ambulatory Visit | Attending: Family Medicine | Admitting: Family Medicine

## 2022-11-30 DIAGNOSIS — R2242 Localized swelling, mass and lump, left lower limb: Secondary | ICD-10-CM

## 2022-12-03 ENCOUNTER — Other Ambulatory Visit: Payer: Self-pay

## 2022-12-13 ENCOUNTER — Ambulatory Visit: Payer: Medicaid Other | Attending: Family Medicine

## 2022-12-13 ENCOUNTER — Other Ambulatory Visit: Payer: Self-pay

## 2022-12-13 DIAGNOSIS — Z3042 Encounter for surveillance of injectable contraceptive: Secondary | ICD-10-CM | POA: Diagnosis not present

## 2022-12-13 MED ORDER — MEDROXYPROGESTERONE ACETATE 150 MG/ML IM SUSP
150.0000 mg | Freq: Once | INTRAMUSCULAR | Status: AC
Start: 2022-12-13 — End: 2022-12-13
  Administered 2022-12-13: 150 mg via INTRAMUSCULAR

## 2022-12-13 NOTE — Progress Notes (Signed)
Pt was given depo injection in  right ventrogluteal. Pt tolerated shot well Urine HCG needed:NO Pt to return sept 26-Oct 10

## 2022-12-20 ENCOUNTER — Other Ambulatory Visit: Payer: Self-pay

## 2022-12-25 ENCOUNTER — Other Ambulatory Visit: Payer: Self-pay

## 2022-12-28 ENCOUNTER — Ambulatory Visit (INDEPENDENT_AMBULATORY_CARE_PROVIDER_SITE_OTHER): Payer: Medicaid Other | Admitting: Podiatry

## 2022-12-28 DIAGNOSIS — L600 Ingrowing nail: Secondary | ICD-10-CM | POA: Diagnosis not present

## 2022-12-28 NOTE — Progress Notes (Signed)
Subjective:  Patient ID: Sarah Garrison, female    DOB: 03-Jun-1981,  MRN: [id number]  Chief Complaint  Patient presents with   Ingrown Toenail    Ingrown left hallux bilateral borders. No pus or bleeding. Soreness to bilateral borders. Patient is diabetic.     42 y.o. female presents with concern for ingrown nail in the bilateral hallux border of the left great toe.  She states that she is having pain with fingernail any pressure on the area.  Patient states she does not want have a procedure done today as she has to go to work that the injection  Past Medical History:  Diagnosis Date   Anxiety    Asthma    Chronic constipation    Chronic shortness of breath    Depression    GERD (gastroesophageal reflux disease)    History of COVID-19 09/29/2019   09-28-2020 result in epic; hospital admission 10-05-2019 for covid pneumonia with hypoxic acute respiratory failure, no oxygen needed when discharged 10-06-2019;  and positive 06-13-2020 result in epic, pt stated no symptoms   History of seizures    (05-04-2021  pt stated was told during hospital admission 05/ 2021 with covid , she had 2 seizures,  stated no medication and no seizure since)   Hypertension    Insulin dependent type 2 diabetes mellitus [facility])    endocrinologist-- Fredia Sorrow NP;   pt uses insulin pump   Insulin pump in place    Mixed hyperlipidemia    OSA (obstructive sleep apnea)    ( 05-04-2021 pt stated has not used cpap since 05/ 2021) followed by dr byrum;  study in epic 02-16-2019 moderate , titrated cpap 08-28-2019   Plantar fasciitis, right    w/ chronic pain    Allergies  Allergen Reactions   Morphine And Codeine Shortness Of Breath   Glyburide Diarrhea and Other (See Comments)    Reaction:  Nose bleeds    Hydrocodone Other (See Comments)    Pt states that this medication makes her "dream about rabbits chasing her"   Ivp Dye [Iodinated Contrast Media] Other (See Comments)    Shortness of breath.      Oxycodone Other (See Comments)    Pt states that this medication makes her "dream about rabbits chasing" her.      ROS: Negative except as per HPI above  Objective:  General: AAO x3, NAD  Dermatological: Incurvation is present along the bilateral nail border of the left great toe. There is localized edema without any erythema or increase in warmth around the nail border. There is no drainage or pus. There is no ascending cellulitis. No malodor. No open lesions or pre-ulcerative lesions.    Vascular:  Dorsalis Pedis artery and Posterior Tibial artery pedal pulses are 2/4 bilateral.  Capillary fill time < 3 sec to all digits.   Neruologic: Grossly intact via light touch bilateral. Protective threshold intact to all sites bilateral.   Musculoskeletal: No gross boney pedal deformities bilateral. No pain, crepitus, or limitation noted with foot and ankle range of motion bilateral. Muscular strength 5/5 in all groups tested bilateral.  Gait: Unassisted, Nonantalgic.   No images are attached to the encounter.  Assessment:   1. Ingrown nail of great toe of left foot      Plan:  Patient was evaluated and treated and all questions answered.  # Ingrown nail left great -Discussed with patient that she does have an ingrown nail with bilateral borders causing issue -I recommended  a bilateral border ingrown removal with phenol and alcohol matricectomy on the -Discussed the benefits of this procedure.  Patient does not want to proceed today because she has work later does not want an injection -Provided an aggressive slant back procedure on the left great toe and discussed scheduling the procedure for the future -Epsom salt soaks until she has a procedure   No follow-ups on file.          Corinna Gab, DPM [facility]

## 2023-01-01 ENCOUNTER — Ambulatory Visit: Payer: Medicaid Other | Admitting: Family Medicine

## 2023-01-04 ENCOUNTER — Ambulatory Visit (HOSPITAL_COMMUNITY)
Admission: RE | Admit: 2023-01-04 | Discharge: 2023-01-04 | Disposition: A | Discharge status: Home / Self Care | Payer: Medicaid Other | Source: Ambulatory Visit | Attending: Family Medicine | Admitting: Family Medicine

## 2023-01-04 ENCOUNTER — Encounter (HOSPITAL_COMMUNITY): Payer: Self-pay

## 2023-01-04 ENCOUNTER — Other Ambulatory Visit: Payer: Self-pay

## 2023-01-04 VITALS — BP 135/88 | HR 79 | Temp 98.6°F | Resp 16

## 2023-01-04 DIAGNOSIS — M79662 Pain in left lower leg: Secondary | ICD-10-CM

## 2023-01-04 DIAGNOSIS — M79661 Pain in right lower leg: Secondary | ICD-10-CM

## 2023-01-04 MED ORDER — KETOROLAC TROMETHAMINE 10 MG PO TABS
10.0000 mg | ORAL_TABLET | Freq: Four times a day (QID) | ORAL | 0 refills | Status: DC | PRN
  Filled 2023-01-04: qty 20, 5d supply, fill #0

## 2023-01-04 MED ORDER — KETOROLAC TROMETHAMINE 30 MG/ML IJ SOLN
INTRAMUSCULAR | Status: AC
  Filled 2023-01-04: qty 1

## 2023-01-04 MED ORDER — KETOROLAC TROMETHAMINE 30 MG/ML IJ SOLN
30.0000 mg | Freq: Once | INTRAMUSCULAR | Status: AC
  Administered 2023-01-04: 30 mg via INTRAMUSCULAR

## 2023-01-04 NOTE — Discharge Instructions (Signed)
You have been given a shot of Toradol 30 mg today.  Ketorolac 10 mg tablets--take 1 tablet every 6 hours as needed for pain.  This is the same medicine that is in the shot we just gave you  You can try a warm compress or ice on your sore muscles.

## 2023-01-04 NOTE — ED Triage Notes (Addendum)
Patient states she was moving 3 days ago and is now having bilateral calf pain and feet swelling.  Patient states she took Tylenol 1000 mg at 0930 today and "no relief from the pain." Patient also reports that she has used epsom salts and alcohol rubs.

## 2023-01-04 NOTE — ED Provider Notes (Signed)
[facility]    CSN: [id number] Arrival date & time: 01/04/23  1159      History   Chief Complaint Chief Complaint  Patient presents with   calf pain    HPI Sarah Garrison is a 42 y.o. female.   HPI Here for pain in both calves.  Her ankles are slightly swollen also.  She moved houses on July 30 and had to go up and down steps.  That is when this pain started bilaterally.  No fever or cough or shortness of breath.  She does have diabetes and uses an insulin pump.  She states her sugars have been in the 160s.    Past Medical History:  Diagnosis Date   Anxiety    Asthma    Chronic constipation    Chronic shortness of breath    Depression    GERD (gastroesophageal reflux disease)    History of COVID-19 09/29/2019   09-28-2020 result in epic; hospital admission 10-05-2019 for covid pneumonia with hypoxic acute respiratory failure, no oxygen needed when discharged 10-06-2019;  and positive 06-13-2020 result in epic, pt stated no symptoms   History of seizures    (05-04-2021  pt stated was told during hospital admission 05/ 2021 with covid , she had 2 seizures,  stated no medication and no seizure since)   Hypertension    Insulin dependent type 2 diabetes mellitus [facility])    endocrinologist-- Fredia Sorrow NP;   pt uses insulin pump   Insulin pump in place    Mixed hyperlipidemia    OSA (obstructive sleep apnea)    ( 05-04-2021 pt stated has not used cpap since 05/ 2021) followed by dr byrum;  study in epic 02-16-2019 moderate , titrated cpap 08-28-2019   Plantar fasciitis, right    w/ chronic pain    Patient Active Problem List   Diagnosis Date Noted   Acute abdominal pain in left lower quadrant 11/01/2022   Left tennis elbow 08/24/2022   Pain in both knees 08/24/2022   Neurogenic pain of right foot 08/24/2022   Pain of right lower extremity 07/26/2022   Palpitations 07/31/2021   Fatigue 07/31/2021   Primary hypertension 07/31/2021   Hip pain 12/08/2019    Hyperlipidemia associated with type 2 diabetes mellitus (HCC) 11/23/2019   History of COVID-19 10/27/2019   Stuttering 10/27/2019   Depression with anxiety 10/27/2019   Obesity, Class III, BMI 40-49.9 (morbid obesity) (HCC) 06/30/2019   OSA (obstructive sleep apnea) 02/16/2019   Essential hypertension 06/22/2018   Type 2 diabetes mellitus with hyperosmolarity without coma, with long-term current use of insulin (HCC) 12/13/2016   Asthma 07/30/2016   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/07/2016   DM neuropathy, type II diabetes mellitus (HCC) 08/24/2015    Past Surgical History:  Procedure Laterality Date   CESAREAN SECTION  1999   PLANTAR FASCIA RELEASE Right 05/08/2021   Procedure: ENDOSCOPIC PLANTAR FASCIOTOMY;  Surgeon: Asencion Islam, DPM;  Location: [facility];  Service: Podiatry;  Laterality: Right;  WITH BLOCK   STERIOD INJECTION Bilateral 05/08/2021   Procedure: PLATELET RICH PLASMA INJECTION HEELS;  Surgeon: Asencion Islam, DPM;  Location: [facility];  Service: Podiatry;  Laterality: Bilateral;   TUBAL LIGATION Bilateral 09/29/2006   @[facility] ;   [facility]   VENTRAL HERNIA REPAIR N/A 11/27/2017   Procedure: VENTRAL HERNIA REPAIR ERAS PATHWAY;  Surgeon: Harriette Bouillon, MD;  Location: [facility];  Service: General;  Laterality: N/A;   WISDOM TOOTH EXTRACTION  OB History     Gravida  5   Para  4   Term      Preterm      AB  1   Living  4      SAB  1   IAB      Ectopic      Multiple      Live Births               Home Medications    Prior to Admission medications   Medication Sig Start Date End Date Taking? Authorizing Provider  ketorolac (TORADOL) 10 MG tablet Take 1 tablet (10 mg total) by mouth every 6 (six) hours as needed (pain). 01/04/23  Yes Zenia Resides, MD  atorvastatin (LIPITOR) 40 MG tablet Take 1 tablet (40 mg total) by mouth daily. 05/31/22   Hoy Register, MD  cetirizine (ZYRTEC ALLERGY) 10 MG  tablet Take 1 tablet (10 mg total) by mouth daily. 09/27/22   Rising, Lurena Joiner, PA-C  Continuous Blood Gluc Sensor (DEXCOM G6 SENSOR) MISC USE AND CHANGE SENSOR EVERY 10 DAYS AS DIRECTED 03/14/22   [provider]  Continuous Blood Gluc Sensor (DEXCOM G6 SENSOR) MISC SMARTSIG:Topical Every 10 Days    [provider]  Continuous Blood Gluc Transmit (DEXCOM G6 TRANSMITTER) MISC USE TO CHECK BLOOD SUGAR EVERY DAY. CHANGE EVERY 3 MONTHS AS DIRECTED 03/14/22   [provider]  famotidine (PEPCID) 20 MG tablet Take 1 tablet (20 mg total) by mouth daily. 11/01/22   Elayne Snare K, DO  fluticasone (FLONASE) 50 MCG/ACT nasal spray Place 2 sprays into both nostrils daily. 09/27/22   Rising, Lurena Joiner, PA-C  insulin aspart (NOVOLOG FLEXPEN) 100 UNIT/ML FlexPen Inject into the skin 3 (three) times daily before meals. ONLY WHEN INSULIN PUMP MALFUNCTION'S PER ENDOCRINOLOGY DIRECTION    [provider]  insulin aspart (NOVOLOG) 100 UNIT/ML injection USE VIA INSULIN PUMP. MAX TOTAL DAILY DOSE OF 200 UNITS 11/13/21     insulin aspart (NOVOLOG) 100 UNIT/ML injection USE VIA INSULIN PUMP. MAX TOTAL DAILY DOSE OF 250 UNITS 07/30/22     insulin aspart (NOVOLOG) 100 UNIT/ML injection USE VIA INSULIN PUMP. MAX TOTAL DAILY DOSE OF 300 UNITS 11/27/22     Insulin Glargine Solostar 300 UNIT/ML SOPN Inject 70 Units into the skin daily. Only if insullin pump malfunction. 11/27/22     Insulin Human (INSULIN PUMP) SOLN Inject 1 each into the skin See admin instructions. Medication: Novolog 100 units/ml injection. Takes 100 units via pump per patient.    [provider]  Insulin Syringe-Needle U-100 31G X 5/16" 1 ML MISC inject 10 units into the skin 3 times daily. use to inject novolog based on insulin to card ratio- max daily dose 150 units 09/23/20     olopatadine (PATANOL) 0.1 % ophthalmic solution Place 1 drop into both eyes 2 (two) times daily. 09/27/22   Rising, Rebecca, PA-C  ondansetron  (ZOFRAN) 4 MG tablet Take 1 tablet (4 mg total) by mouth every 6 (six) hours. 11/01/22   Elayne Snare K, DO  OZEMPIC, 0.25 OR 0.5 MG/DOSE, 2 MG/3ML SOPN Inject into the skin. 06/13/22   [provider]  pantoprazole (PROTONIX) 40 MG tablet Take 1 tablet (40 mg total) by mouth daily. 10/23/22   Hoy Register, MD  simethicone (GAS-X) 80 MG chewable tablet Chew 1 tablet (80 mg total) by mouth every 6 (six) hours as needed for flatulence. 11/01/22   Elayne Snare K, DO  ARIPiprazole (ABILIFY) 5  MG tablet Take 1 tablet (5 mg total) by mouth daily. 10/27/19 06/13/20  Sater, Pearletha Furl, MD    Family History Family History  Problem Relation Age of Onset   Asthma Mother    Kidney failure Mother    Brain cancer Mother    Asthma Father    Other Father        surgery on stomach but don't know from what   Breast cancer Maternal Grandmother    Diabetes Maternal Grandmother    Diabetes Brother     Social History Social History   Tobacco Use   Smoking status: Former    Current packs/day: 0.00    Average packs/day: 0.1 packs/day for 2.0 years (0.2 ttl pk-yrs)    Types: Cigarettes    Start date: 11/28/2015    Quit date: 11/27/2017    Years since quitting: 5.1   Smokeless tobacco: Never  Vaping Use   Vaping status: Some Days   Devices: Elfvar  (05-04-2021 per pt last vaped approx 2 wks ago)  Substance Use Topics   Alcohol use: No   Drug use: Never     Allergies   Morphine and codeine, Glyburide, Hydrocodone, Ivp dye [iodinated contrast media], and Oxycodone   Review of Systems Review of Systems   Physical Exam Triage Vital Signs ED Triage Vitals  Encounter Vitals Group     BP 01/04/23 1214 135/88     Systolic BP Percentile --      Diastolic BP Percentile --      Pulse Rate 01/04/23 1214 79     Resp 01/04/23 1214 16     Temp 01/04/23 1214 98.6 F (37 C)     Temp Source 01/04/23 1214 Oral     SpO2 01/04/23 1214 95 %     Weight --      Height --      Head  Circumference --      Peak Flow --      Pain Score 01/04/23 1216 10     Pain Loc --      Pain Education --      Exclude from Growth Chart --    No data found.  Updated Vital Signs BP 135/88 (BP Location: Left Arm)   Pulse 79   Temp 98.6 F (37 C) (Oral)   Resp 16   SpO2 95%   Visual Acuity Right Eye Distance:   Left Eye Distance:   Bilateral Distance:    Right Eye Near:   Left Eye Near:    Bilateral Near:     Physical Exam Vitals reviewed.  Constitutional:      General: She is not in acute distress.    Appearance: She is not ill-appearing, toxic-appearing or diaphoretic.  Cardiovascular:     Rate and Rhythm: Normal rate and regular rhythm.     Heart sounds: No murmur heard. Pulmonary:     Effort: Pulmonary effort is normal. No respiratory distress.     Breath sounds: Normal breath sounds. No stridor. No wheezing, rhonchi or rales.  Musculoskeletal:     Comments: Both calves are tender but Homans is negative bilaterally.  There is trace edema in the left ankle only.  No erythema.  Pulses are normal in the posterior tibialis area and in the dorsalis pedis area.  There is no erythema or deformity  Skin:    Coloration: Skin is not pale.  Neurological:     General: No focal deficit present.     Mental Status:  She is alert and oriented to person, place, and time.  Psychiatric:        Behavior: Behavior normal.      UC Treatments / Results  Labs (all labs ordered are listed, but only abnormal results are displayed) Labs Reviewed - No data to display  EKG   Radiology No results found.  Procedures Procedures (including critical care time)  Medications Ordered in UC Medications  ketorolac (TORADOL) 30 MG/ML injection 30 mg (has no administration in time range)    Initial Impression / Assessment and Plan / UC Course  I have reviewed the triage vital signs and the nursing notes.  Pertinent labs & imaging results that were available during my care of the  patient were reviewed by me and considered in my medical decision making (see chart for details).       eGFR is greater than 60 when done in May of this year.  Toradol injection is given here and Toradol tablets are sent to the pharmacy. Final Clinical Impressions(s) / UC Diagnoses   Final diagnoses:  Bilateral calf pain     Discharge Instructions      You have been given a shot of Toradol 30 mg today.  Ketorolac 10 mg tablets--take 1 tablet every 6 hours as needed for pain.  This is the same medicine that is in the shot we just gave you  You can try a warm compress or ice on your sore muscles.     ED Prescriptions     Medication Sig Dispense Auth. Provider   ketorolac (TORADOL) 10 MG tablet Take 1 tablet (10 mg total) by mouth every 6 (six) hours as needed (pain). 20 tablet , Janace Aris, MD      PDMP not reviewed this encounter.   Zenia Resides, MD 01/04/23 [phone]

## 2023-01-11 ENCOUNTER — Ambulatory Visit: Payer: Medicaid Other | Admitting: Podiatry

## 2023-01-11 DIAGNOSIS — L600 Ingrowing nail: Secondary | ICD-10-CM

## 2023-01-11 DIAGNOSIS — E1140 Type 2 diabetes mellitus with diabetic neuropathy, unspecified: Secondary | ICD-10-CM

## 2023-01-11 DIAGNOSIS — Z794 Long term (current) use of insulin: Secondary | ICD-10-CM

## 2023-01-11 NOTE — Patient Instructions (Signed)
Soak Instructions    THE DAY AFTER THE PROCEDURE  Place 1/4 cup of epsom salts (or betadine, or white vinegar) in a quart of warm tap water.  Submerge your foot or feet with outer bandage intact for the initial soak; this will allow the bandage to become moist and wet for easy lift off.  Once you remove your bandage, continue to soak in the solution for 20 minutes.  This soak should be done twice a day.  Next, remove your foot or feet from solution, blot dry the affected area and cover.  You may use a band aid large enough to cover the area or use gauze and tape.  Apply other medications to the area as directed by the doctor such as polysporin neosporin.  IF YOUR SKIN BECOMES IRRITATED WHILE USING THESE INSTRUCTIONS, IT IS OKAY TO SWITCH TO  WHITE VINEGAR AND WATER. Or you may use antibacterial soap and water to keep the toe clean  Monitor for any signs/symptoms of infection. Call the office immediately if any occur or go directly to the emergency room. Call with any questions/concerns.    Long Term Care Instructions-Post Nail Surgery  You have had your ingrown toenail and root treated with a chemical.  This chemical causes a burn that will drain and ooze like a blister.  This can drain for 6-8 weeks or longer.  It is important to keep this area clean, covered, and follow the soaking instructions dispensed at the time of your surgery.  This area will eventually dry and form a scab.  Once the scab forms you no longer need to soak or apply a dressing.  If at any time you experience an increase in pain, redness, swelling, or drainage, you should contact the office as soon as possible.  

## 2023-01-11 NOTE — Progress Notes (Signed)
Subjective:  Patient ID: Sarah Garrison, female    DOB: 11-Aug-1980,  MRN: [id number]  Chief Complaint  Patient presents with   Ingrown Toenail    Ingrown nail of left hallux     42 y.o. female presents with concern for ingrown nail in the bilateral hallux border of the left great toe.    Past Medical History:  Diagnosis Date   Anxiety    Asthma    Chronic constipation    Chronic shortness of breath    Depression    GERD (gastroesophageal reflux disease)    History of COVID-19 09/29/2019   09-28-2020 result in epic; hospital admission 10-05-2019 for covid pneumonia with hypoxic acute respiratory failure, no oxygen needed when discharged 10-06-2019;  and positive 06-13-2020 result in epic, pt stated no symptoms   History of seizures    (05-04-2021  pt stated was told during hospital admission 05/ 2021 with covid , she had 2 seizures,  stated no medication and no seizure since)   Hypertension    Insulin dependent type 2 diabetes mellitus [facility])    endocrinologist-- Fredia Sorrow NP;   pt uses insulin pump   Insulin pump in place    Mixed hyperlipidemia    OSA (obstructive sleep apnea)    ( 05-04-2021 pt stated has not used cpap since 05/ 2021) followed by dr byrum;  study in epic 02-16-2019 moderate , titrated cpap 08-28-2019   Plantar fasciitis, right    w/ chronic pain    Allergies  Allergen Reactions   Morphine And Codeine Shortness Of Breath   Glyburide Diarrhea and Other (See Comments)    Reaction:  Nose bleeds    Hydrocodone Other (See Comments)    Pt states that this medication makes her "dream about rabbits chasing her"   Ivp Dye [Iodinated Contrast Media] Other (See Comments)    Shortness of breath.     Oxycodone Other (See Comments)    Pt states that this medication makes her "dream about rabbits chasing" her.      ROS: Negative except as per HPI above  Objective:  General: AAO x3, NAD  Dermatological: Incurvation is present along the medial nail border  of the left great toe. There is localized edema without any erythema or increase in warmth around the nail border. There is no drainage or pus. There is no ascending cellulitis. No malodor. No open lesions or pre-ulcerative lesions.    Vascular:  Dorsalis Pedis artery and Posterior Tibial artery pedal pulses are 2/4 bilateral.  Capillary fill time < 3 sec to all digits.   Neruologic: Grossly intact via light touch bilateral. Protective threshold intact to all sites bilateral.   Musculoskeletal: No gross boney pedal deformities bilateral. No pain, crepitus, or limitation noted with foot and ankle range of motion bilateral. Muscular strength 5/5 in all groups tested bilateral.  Gait: Unassisted, Nonantalgic.   No images are attached to the encounter.  Assessment:   1. Ingrown nail of great toe of left foot   2. Type 2 diabetes mellitus with diabetic neuropathy, with long-term current use of insulin (HCC)       Plan:  Patient was evaluated and treated and all questions answered.    Ingrown Nail, left -Patient elects to proceed with minor surgery to remove ingrown toenail today. Consent reviewed and signed by patient. -Ingrown nail excised. See procedure note. -Educated on post-procedure care including soaking. Written instructions provided and reviewed. -Patient to follow up in 2 weeks for nail check.  Procedure: Excision of Ingrown Toenail Location: Left 1st toe medial nail borders. Anesthesia: Lidocaine 1% plain; 1.5 mL and Marcaine 0.5% plain; 1.5 mL, digital block. Skin Prep: Betadine. Dressing: Silvadene; telfa; dry, sterile, compression dressing. Technique: Following skin prep, the toe was exsanguinated and a tourniquet was secured at the base of the toe. The affected nail border was freed, split with a nail splitter, and excised. Chemical matrixectomy was then performed with phenol and irrigated out with alcohol. The tourniquet was then removed and sterile dressing  applied. Disposition: Patient tolerated procedure well. Patient to return in 2 weeks for follow-up.     Return in about 2 weeks (around 01/25/2023) for nail check.          Corinna Gab, DPM [facility] / [facility]

## 2023-01-16 NOTE — Progress Notes (Unsigned)
Patient ID: Sarah Garrison, female   DOB: September 03, 1980, 42 y.o.   MRN: [id number]   UC B calf pain 01/04/2023:  From A/P: You have been given a shot of Toradol 30 mg today.   Ketorolac 10 mg tablets--take 1 tablet every 6 hours as needed for pain.  This is the same medicine that is in the shot we just gave you   You can try a warm compress or ice on your sore muscles.

## 2023-01-17 ENCOUNTER — Encounter: Payer: Self-pay | Admitting: Physician Assistant

## 2023-01-17 ENCOUNTER — Ambulatory Visit: Payer: Medicaid Other | Attending: Physician Assistant | Admitting: Physician Assistant

## 2023-01-17 ENCOUNTER — Other Ambulatory Visit: Payer: Self-pay

## 2023-01-17 VITALS — BP 131/89 | HR 96 | Wt 244.2 lb

## 2023-01-17 DIAGNOSIS — E1100 Type 2 diabetes mellitus with hyperosmolarity without nonketotic hyperglycemic-hyperosmolar coma (NKHHC): Secondary | ICD-10-CM | POA: Diagnosis not present

## 2023-01-17 DIAGNOSIS — Z794 Long term (current) use of insulin: Secondary | ICD-10-CM

## 2023-01-17 DIAGNOSIS — Z09 Encounter for follow-up examination after completed treatment for conditions other than malignant neoplasm: Secondary | ICD-10-CM | POA: Diagnosis not present

## 2023-01-17 DIAGNOSIS — L600 Ingrowing nail: Secondary | ICD-10-CM

## 2023-01-17 MED ORDER — MUPIROCIN 2 % EX OINT
1.0000 | TOPICAL_OINTMENT | Freq: Two times a day (BID) | CUTANEOUS | 0 refills | Status: DC
Start: 2023-01-17 — End: 2023-03-22
  Filled 2023-01-17: qty 22, 11d supply, fill #0

## 2023-01-17 NOTE — Patient Instructions (Signed)
Elevate your foot above the level of your heart as much as you can the next few days to decrease swelling and promote healing.  Continue epsom salt soaks 2-3 times a daily.  Reduce sugar/starch intake so your blood sugars will be lower.  If this does not begin to improve or worsens at all or your foot changes color, please call podiatry or report to the hospital   Ingrown Toenail  An ingrown toenail occurs when the corner or sides of a toenail grow into the surrounding skin. This causes discomfort and pain. The big toe is most commonly affected, but any of the toes can be affected. If an ingrown toenail is not treated, it can become infected. What are the causes? This condition may be caused by: Wearing shoes that are too small or tight. An injury, such as stubbing your toe or having your toe stepped on. Improper cutting or care of your toenails. Having nail or foot abnormalities that were present from birth (congenital abnormalities), such as having a nail that is too big for your toe. What increases the risk? The following factors may make you more likely to develop ingrown toenails: Age. Nails tend to get thicker with age, so ingrown nails are more common among older people. Cutting your toenails incorrectly, such as cutting them very short or cutting them unevenly. An ingrown toenail is more likely to get infected if you have: Diabetes. Blood flow (circulation) problems. What are the signs or symptoms? Symptoms of an ingrown toenail may include: Pain, soreness, or tenderness. Redness. Swelling. Hardening of the skin that surrounds the toenail. Signs that an ingrown toenail may be infected include: Fluid or pus. Symptoms that get worse. How is this diagnosed? Ingrown toenails may be diagnosed based on: Your symptoms and medical history. A physical exam. Labs or tests. If you have fluid or blood coming from your toenail, a sample may be collected to test for the specific type of  bacteria that is causing the infection. How is this treated? Treatment depends on the severity of your symptoms. You may be able to care for your toenail at home. If you have an infection, you may be prescribed antibiotic medicines. If you have fluid or pus draining from your toenail, your health care provider may drain it. If you have trouble walking, you may be given crutches to use. If you have a severe or infected ingrown toenail, you may need a procedure to remove part or all of the nail. Follow these instructions at home: Foot care  Check your wound every day for signs of infection, or as often as told by your health care provider. Check for: More redness, swelling, or pain. More fluid or blood. Warmth. Pus or a bad smell. Do not pick at your toenail or try to remove it yourself. Soak your foot in warm, soapy water. Do this for 20 minutes, 3 times a day, or as often as told by your health care provider. This helps to keep your toe clean and your skin soft. Wear shoes that fit well and are not too tight. Your health care provider may recommend that you wear open-toed shoes while you heal. Trim your toenails regularly and carefully. Cut your toenails straight across to prevent injury to the skin at the corners of the toenail. Do not cut your nails in a curved shape. Keep your feet clean and dry to help prevent infection. General instructions Take over-the-counter and prescription medicines only as told by your health care  provider. If you were prescribed an antibiotic, take it as told by your health care provider. Do not stop taking the antibiotic even if you start to feel better. If your health care provider told you to use crutches to help you move around, use them as instructed. Return to your normal activities as told by your health care provider. Ask your health care provider what activities are safe for you. Keep all follow-up visits. This is important. Contact a health care  provider if: You have more redness, swelling, pain, or other symptoms that do not improve with treatment. You have fluid, blood, or pus coming from your toenail. You have a red streak on your skin that starts at your foot and spreads up your leg. You have a fever. Summary An ingrown toenail occurs when the corner or sides of a toenail grow into the surrounding skin. This causes discomfort and pain. The big toe is most commonly affected, but any of the toes can be affected. If an ingrown toenail is not treated, it can become infected. Fluid or pus draining from your toenail is a sign of infection. Your health care provider may need to drain it. You may be given antibiotics to treat the infection. Trimming your toenails regularly and properly can help you prevent an ingrown toenail. This information is not intended to replace advice given to you by your health care provider. Make sure you discuss any questions you have with your health care provider. Document Revised: 09/20/2020 Document Reviewed: 09/20/2020 Elsevier Patient Education  2024 [employer].

## 2023-01-20 ENCOUNTER — Ambulatory Visit (HOSPITAL_COMMUNITY)
Admission: EM | Admit: 2023-01-20 | Discharge: 2023-01-20 | Disposition: A | Discharge status: Home / Self Care | Payer: Medicaid Other

## 2023-01-20 ENCOUNTER — Encounter (HOSPITAL_COMMUNITY): Payer: Self-pay

## 2023-01-20 DIAGNOSIS — E1165 Type 2 diabetes mellitus with hyperglycemia: Secondary | ICD-10-CM

## 2023-01-20 DIAGNOSIS — Z794 Long term (current) use of insulin: Secondary | ICD-10-CM

## 2023-01-20 DIAGNOSIS — R197 Diarrhea, unspecified: Secondary | ICD-10-CM | POA: Diagnosis not present

## 2023-01-20 NOTE — ED Triage Notes (Signed)
Patient states she started a foot cream 2 days ago. Since then she had severe diarrhea. Pt states 5-6 loose stools this morning.

## 2023-01-20 NOTE — ED Provider Notes (Signed)
[facility]    CSN: [id number] Arrival date & time: 01/20/23  1002      History   Chief Complaint Chief Complaint  Patient presents with   Diarrhea    HPI Sarah Garrison is a 42 y.o. female.   Patient presents to urgent care for evaluation of loose stools that started yesterday.  She states she has had 5-6 loose stools this morning since waking without blood/mucus in the stool.  She was recently placed on mupirocin topical ointment to the left great toe to prevent infection post ingrown toenail excision and wonders if this could have caused diarrhea.  No recent oral antibiotics or steroid use.  Reports generalized abdominal discomfort that is worse with defecation.  No nausea, vomiting, rash, fever/chills, dizziness, viral URI symptoms/cough, or recent sick contacts with similar symptoms.  She has attempted use of Pepto-Bismol, Imodium without relief.  She is an insulin-dependent type 2 diabetic and has a continuous blood sugar monitor as well as insulin pump.  Her blood sugars currently 200.  She states she ate some salmon this morning and attempt to help with the diarrhea without relief.  Drinks plenty of water and 0 sugar Powerade/Gatorade light.  She stopped using mupirocin ointment due to concern for diarrhea.   Diarrhea   Past Medical History:  Diagnosis Date   Anxiety    Asthma    Chronic constipation    Chronic shortness of breath    Depression    GERD (gastroesophageal reflux disease)    History of COVID-19 09/29/2019   09-28-2020 result in epic; hospital admission 10-05-2019 for covid pneumonia with hypoxic acute respiratory failure, no oxygen needed when discharged 10-06-2019;  and positive 06-13-2020 result in epic, pt stated no symptoms   History of seizures    (05-04-2021  pt stated was told during hospital admission 05/ 2021 with covid , she had 2 seizures,  stated no medication and no seizure since)   Hypertension    Insulin dependent type 2  diabetes mellitus [facility])    endocrinologist-- Fredia Sorrow NP;   pt uses insulin pump   Insulin pump in place    Mixed hyperlipidemia    OSA (obstructive sleep apnea)    ( 05-04-2021 pt stated has not used cpap since 05/ 2021) followed by dr byrum;  study in epic 02-16-2019 moderate , titrated cpap 08-28-2019   Plantar fasciitis, right    w/ chronic pain    Patient Active Problem List   Diagnosis Date Noted   Acute abdominal pain in left lower quadrant 11/01/2022   Left tennis elbow 08/24/2022   Pain in both knees 08/24/2022   Neurogenic pain of right foot 08/24/2022   Pain of right lower extremity 07/26/2022   Palpitations 07/31/2021   Fatigue 07/31/2021   Primary hypertension 07/31/2021   Hip pain 12/08/2019   Hyperlipidemia associated with type 2 diabetes mellitus (HCC) 11/23/2019   History of COVID-19 10/27/2019   Stuttering 10/27/2019   Depression with anxiety 10/27/2019   Obesity, Class III, BMI 40-49.9 (morbid obesity) (HCC) 06/30/2019   OSA (obstructive sleep apnea) 02/16/2019   Essential hypertension 06/22/2018   Type 2 diabetes mellitus with hyperosmolarity without coma, with long-term current use of insulin (HCC) 12/13/2016   Asthma 07/30/2016   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/07/2016   DM neuropathy, type II diabetes mellitus (HCC) 08/24/2015    Past Surgical History:  Procedure Laterality Date   CESAREAN SECTION  1999   PLANTAR FASCIA RELEASE Right 05/08/2021   Procedure:  ENDOSCOPIC PLANTAR FASCIOTOMY;  Surgeon: Asencion Islam, DPM;  Location: [facility];  Service: Podiatry;  Laterality: Right;  WITH BLOCK   STERIOD INJECTION Bilateral 05/08/2021   Procedure: PLATELET RICH PLASMA INJECTION HEELS;  Surgeon: Asencion Islam, DPM;  Location: [facility];  Service: Podiatry;  Laterality: Bilateral;   TUBAL LIGATION Bilateral 09/29/2006   @[facility] ;   [facility]   VENTRAL HERNIA REPAIR N/A 11/27/2017   Procedure: VENTRAL HERNIA REPAIR ERAS PATHWAY;   Surgeon: Harriette Bouillon, MD;  Location: [facility];  Service: General;  Laterality: N/A;   WISDOM TOOTH EXTRACTION      OB History     Gravida  5   Para  4   Term      Preterm      AB  1   Living  4      SAB  1   IAB      Ectopic      Multiple      Live Births               Home Medications    Prior to Admission medications   Medication Sig Start Date End Date Taking? Authorizing Provider  atorvastatin (LIPITOR) 40 MG tablet Take 1 tablet (40 mg total) by mouth daily. 05/31/22   Hoy Register, MD  cetirizine (ZYRTEC ALLERGY) 10 MG tablet Take 1 tablet (10 mg total) by mouth daily. 09/27/22   Rising, Lurena Joiner, PA-C  Continuous Blood Gluc Sensor (DEXCOM G6 SENSOR) MISC USE AND CHANGE SENSOR EVERY 10 DAYS AS DIRECTED 03/14/22   [provider]  Continuous Blood Gluc Sensor (DEXCOM G6 SENSOR) MISC SMARTSIG:Topical Every 10 Days    [provider]  Continuous Blood Gluc Transmit (DEXCOM G6 TRANSMITTER) MISC USE TO CHECK BLOOD SUGAR EVERY DAY. CHANGE EVERY 3 MONTHS AS DIRECTED 03/14/22   [provider]  famotidine (PEPCID) 20 MG tablet Take 1 tablet (20 mg total) by mouth daily. 11/01/22   Elayne Snare K, DO  fluticasone (FLONASE) 50 MCG/ACT nasal spray Place 2 sprays into both nostrils daily. 09/27/22   Rising, Lurena Joiner, PA-C  insulin aspart (NOVOLOG FLEXPEN) 100 UNIT/ML FlexPen Inject into the skin 3 (three) times daily before meals. ONLY WHEN INSULIN PUMP MALFUNCTION'S PER ENDOCRINOLOGY DIRECTION    [provider]  insulin aspart (NOVOLOG) 100 UNIT/ML injection USE VIA INSULIN PUMP. MAX TOTAL DAILY DOSE OF 200 UNITS 11/13/21     insulin aspart (NOVOLOG) 100 UNIT/ML injection USE VIA INSULIN PUMP. MAX TOTAL DAILY DOSE OF 250 UNITS 07/30/22     insulin aspart (NOVOLOG) 100 UNIT/ML injection USE VIA INSULIN PUMP. MAX TOTAL DAILY DOSE OF 300 UNITS 11/27/22     Insulin Glargine Solostar 300 UNIT/ML SOPN Inject 70 Units  into the skin daily. Only if insullin pump malfunction. 11/27/22     Insulin Human (INSULIN PUMP) SOLN Inject 1 each into the skin See admin instructions. Medication: Novolog 100 units/ml injection. Takes 100 units via pump per patient.    [provider]  Insulin Syringe-Needle U-100 31G X 5/16" 1 ML MISC inject 10 units into the skin 3 times daily. use to inject novolog based on insulin to card ratio- max daily dose 150 units 09/23/20     ketorolac (TORADOL) 10 MG tablet Take 1 tablet (10 mg total) by mouth every 6 (six) hours as needed (pain). 01/04/23   Zenia Resides, MD  mupirocin ointment (BACTROBAN) 2 % Apply 1 Application topically 2 (two) times daily.  01/17/23   Anders Simmonds, PA-C  olopatadine (PATANOL) 0.1 % ophthalmic solution Place 1 drop into both eyes 2 (two) times daily. 09/27/22   Rising, Rebecca, PA-C  ondansetron (ZOFRAN) 4 MG tablet Take 1 tablet (4 mg total) by mouth every 6 (six) hours. 11/01/22   Elayne Snare K, DO  OZEMPIC, 0.25 OR 0.5 MG/DOSE, 2 MG/3ML SOPN Inject into the skin. 06/13/22   [provider]  pantoprazole (PROTONIX) 40 MG tablet Take 1 tablet (40 mg total) by mouth daily. 10/23/22   Hoy Register, MD  simethicone (GAS-X) 80 MG chewable tablet Chew 1 tablet (80 mg total) by mouth every 6 (six) hours as needed for flatulence. 11/01/22   Elayne Snare K, DO  ARIPiprazole (ABILIFY) 5 MG tablet Take 1 tablet (5 mg total) by mouth daily. 10/27/19 06/13/20  Sater, Pearletha Furl, MD    Family History Family History  Problem Relation Age of Onset   Asthma Mother    Kidney failure Mother    Brain cancer Mother    Asthma Father    Other Father        surgery on stomach but don't know from what   Breast cancer Maternal Grandmother    Diabetes Maternal Grandmother    Diabetes Brother     Social History Social History   Tobacco Use   Smoking status: Former    Current packs/day: 0.00    Average packs/day: 0.1 packs/day for 2.0 years  (0.2 ttl pk-yrs)    Types: Cigarettes    Start date: 11/28/2015    Quit date: 11/27/2017    Years since quitting: 5.1   Smokeless tobacco: Never  Vaping Use   Vaping status: Some Days   Devices: Elfvar  (05-04-2021 per pt last vaped approx 2 wks ago)  Substance Use Topics   Alcohol use: No   Drug use: Never     Allergies   Morphine and codeine, Glyburide, Hydrocodone, Ivp dye [iodinated contrast media], and Oxycodone   Review of Systems Review of Systems  Gastrointestinal:  Positive for diarrhea.  Per HPI   Physical Exam Triage Vital Signs ED Triage Vitals [01/20/23 1024]  Encounter Vitals Group     BP (!) 120/92     Systolic BP Percentile      Diastolic BP Percentile      Pulse Rate 100     Resp 16     Temp 98.4 F (36.9 C)     Temp Source Oral     SpO2 95 %     Weight      Height      Head Circumference      Peak Flow      Pain Score      Pain Loc      Pain Education      Exclude from Growth Chart    No data found.  Updated Vital Signs BP (!) 120/92 (BP Location: Left Arm)   Pulse 100   Temp 98.4 F (36.9 C) (Oral)   Resp 16   SpO2 95%   Visual Acuity Right Eye Distance:   Left Eye Distance:   Bilateral Distance:    Right Eye Near:   Left Eye Near:    Bilateral Near:     Physical Exam Vitals and nursing note reviewed.  Constitutional:      Appearance: She is not ill-appearing or toxic-appearing.  HENT:     Head: Normocephalic and atraumatic.     Right Ear: Hearing and external  ear normal.     Left Ear: Hearing and external ear normal.     Nose: Nose normal.     Mouth/Throat:     Lips: Pink.  Eyes:     General: Lids are normal. Vision grossly intact. Gaze aligned appropriately.     Extraocular Movements: Extraocular movements intact.     Conjunctiva/sclera: Conjunctivae normal.  Pulmonary:     Effort: Pulmonary effort is normal.  Abdominal:     General: Bowel sounds are normal.     Palpations: Abdomen is soft.     Tenderness: There  is no abdominal tenderness. There is no right CVA tenderness, left CVA tenderness or guarding.  Musculoskeletal:     Cervical back: Neck supple.  Feet:     Comments: Status post bilateral nail fold ingrown toenail excision to the left great toe, no signs of infection. Skin:    General: Skin is warm and dry.     Capillary Refill: Capillary refill takes less than 2 seconds.     Findings: No rash.  Neurological:     General: No focal deficit present.     Mental Status: She is alert and oriented to person, place, and time. Mental status is at baseline.     Cranial Nerves: No dysarthria or facial asymmetry.  Psychiatric:        Mood and Affect: Mood normal.        Speech: Speech normal.        Behavior: Behavior normal.        Thought Content: Thought content normal.        Judgment: Judgment normal.      UC Treatments / Results  Labs (all labs ordered are listed, but only abnormal results are displayed) Labs Reviewed - No data to display  EKG   Radiology No results found.  Procedures Procedures (including critical care time)  Medications Ordered in UC Medications - No data to display  Initial Impression / Assessment and Plan / UC Course  I have reviewed the triage vital signs and the nursing notes.  Pertinent labs & imaging results that were available during my care of the patient were reviewed by me and considered in my medical decision making (see chart for details).   1.  Diarrhea, type 2 diabetes with hyperglycemia Suspect this is viral in nature and unrelated to mupirocin ointment as this was a topical antibiotic and not oral.  She may stop using mupirocin and use Aquaphor instead.  Brat diet recommended.  Recommend frequent intake of water and electrolyte enhanced substances with 0 sugar.  Follow-up with PCP for ongoing evaluation of diarrhea.  She does not appear dehydrated to physical exam, nontachycardic and afebrile.  Will defer stool studies at this time as  symptoms have only been present for 1 to 2 days.  Stool studies may be beneficial in the future should diarrhea persist.   Counseled patient on potential for adverse effects with medications prescribed/recommended today, strict ER and return-to-clinic precautions discussed, patient verbalized understanding.    Final Clinical Impressions(s) / UC Diagnoses   Final diagnoses:  Diarrhea, unspecified type  Type 2 diabetes mellitus with hyperglycemia, with long-term current use of insulin [facility])   Discharge Instructions   None    ED Prescriptions   None    PDMP not reviewed this encounter.   Carlisle Beers, [place] 01/20/23 1119

## 2023-01-24 ENCOUNTER — Encounter: Payer: Self-pay | Admitting: Podiatry

## 2023-01-24 ENCOUNTER — Ambulatory Visit: Payer: Medicaid Other | Admitting: Podiatry

## 2023-01-24 DIAGNOSIS — L600 Ingrowing nail: Secondary | ICD-10-CM | POA: Diagnosis not present

## 2023-01-24 NOTE — Progress Notes (Signed)
Subjective: Sarah Garrison is a 42 y.o.  female returns to office today for follow up evaluation after having left Hallux lateral border nail ingrown removal with phenol and alcohol matrixectomy approximately 2 weeks ago. Patient has been soaking using epsom salts and applying topical antibiotic covered with bandaid daily. Patient denies fevers, chills, nausea, vomiting. Denies any calf pain, chest pain, SOB.   Objective:  Vitals: Reviewed  General: Well developed, nourished, in no acute distress, alert and oriented x3   Dermatology: Skin is warm, dry and supple bilateral. left hallux nail border appears to be clean, dry, with mild granular tissue and surrounding scab. There is no surrounding erythema, edema, drainage/purulence. The remaining nails appear unremarkable at this time. There are no other lesions or other signs of infection present.  Neurovascular status: Intact. No lower extremity swelling; No pain with calf compression bilateral.  Musculoskeletal: Decreased tenderness to palpation of the left hallux nail fold(s). Muscular strength within normal limits bilateral.   Assesement and Plan: S/p phenol and alcohol matrixectomy to the  left hallux nail lateral, doing well.   -Continue soaking in epsom salts twice a day followed by antibiotic ointment and a band-aid. Can leave uncovered at night. Continue this until completely healed.  -If the area has not healed in 2 weeks, call the office for follow-up appointment, or sooner if any problems arise.  -Monitor for any signs/symptoms of infection. Call the office immediately if any occur or go directly to the emergency room. Call with any questions/concerns.        Corinna Gab, DPM [facility] / [facility]                   01/24/2023

## 2023-01-25 ENCOUNTER — Ambulatory Visit: Payer: Medicaid Other | Admitting: Podiatry

## 2023-01-31 ENCOUNTER — Ambulatory Visit: Payer: Medicaid Other

## 2023-01-31 ENCOUNTER — Ambulatory Visit: Payer: Medicaid Other | Admitting: Podiatry

## 2023-01-31 ENCOUNTER — Other Ambulatory Visit: Payer: Self-pay

## 2023-01-31 ENCOUNTER — Ambulatory Visit (INDEPENDENT_AMBULATORY_CARE_PROVIDER_SITE_OTHER): Payer: Medicaid Other

## 2023-01-31 DIAGNOSIS — M659 Synovitis and tenosynovitis, unspecified: Secondary | ICD-10-CM

## 2023-01-31 DIAGNOSIS — M7751 Other enthesopathy of right foot: Secondary | ICD-10-CM

## 2023-01-31 DIAGNOSIS — M779 Enthesopathy, unspecified: Secondary | ICD-10-CM

## 2023-01-31 DIAGNOSIS — M7741 Metatarsalgia, right foot: Secondary | ICD-10-CM

## 2023-01-31 MED ORDER — METHYLPREDNISOLONE 4 MG PO TBPK
ORAL_TABLET | ORAL | 0 refills | Status: DC
  Filled 2023-01-31: qty 21, 6d supply, fill #0

## 2023-01-31 NOTE — Progress Notes (Signed)
Subjective:  Patient ID: Sarah Garrison, female    DOB: [birth year],  MRN: [id number]  Chief Complaint  Patient presents with   Foot Pain    right foot pain hard to walk on it. Ball of right foot and top of foot near the 2nd and 3rd toe. Only when applying pressure.     42 y.o. female presents with concern for pain in the right foot hard to walk on.  Has pain in the ball of the right foot near the second and third metatarsal phalangeal joints.  Says he has pain with any pressure on the foot.  Past Medical History:  Diagnosis Date   Anxiety    Asthma    Chronic constipation    Chronic shortness of breath    Depression    GERD (gastroesophageal reflux disease)    History of COVID-19 09/29/2019   09-28-2020 result in epic; hospital admission 10-05-2019 for covid pneumonia with hypoxic acute respiratory failure, no oxygen needed when discharged 10-06-2019;  and positive 06-13-2020 result in epic, pt stated no symptoms   History of seizures    (05-04-2021  pt stated was told during hospital admission 05/ 2021 with covid , she had 2 seizures,  stated no medication and no seizure since)   Hypertension    Insulin dependent type 2 diabetes mellitus [facility])    endocrinologist-- Fredia Sorrow NP;   pt uses insulin pump   Insulin pump in place    Mixed hyperlipidemia    OSA (obstructive sleep apnea)    ( 05-04-2021 pt stated has not used cpap since 05/ 2021) followed by dr byrum;  study in epic 02-16-2019 moderate , titrated cpap 08-28-2019   Plantar fasciitis, right    w/ chronic pain    Allergies  Allergen Reactions   Morphine And Codeine Shortness Of Breath   Glyburide Diarrhea and Other (See Comments)    Reaction:  Nose bleeds    Hydrocodone Other (See Comments)    Pt states that this medication makes her "dream about rabbits chasing her"   Ivp Dye [Iodinated Contrast Media] Other (See Comments)    Shortness of breath.     Mupirocin Diarrhea   Oxycodone Other (See Comments)     Pt states that this medication makes her "dream about rabbits chasing" her.      ROS: Negative except as per HPI above  Objective:  General: AAO x3, NAD  Dermatological: With inspection and palpation of the right and left lower extremities there are no open sores, no preulcerative lesions, no rash or signs of infection present. Nails are of normal length thickness and coloration.   Vascular:  Dorsalis Pedis artery and Posterior Tibial artery pedal pulses are 2/4 bilateral.  Capillary fill time < 3 sec to all digits.   Neruologic: Grossly intact via light touch bilateral. Protective threshold intact to all sites bilateral.   Musculoskeletal: Significant pain with palpation of the second and third metatarsophalangeal joints with mild edema at this area.  Pain is increased with movement of the second and third toes.  Gait: Unassisted, Nonantalgic.   No images are attached to the encounter.  Radiographs:  Date: 01/31/2023 XR the right foot Weightbearing AP/Lateral/Oblique   Findings: Attention directed to the second and third metatarsophalangeal joints and metatarsals there is no obvious fracture identified on these views.  No significant arthritic changes noted.  No obvious osseous abnormality identified mild edema of the forefoot Assessment:   1. Synovitis of right foot   2.  Metatarsalgia of right foot   3. Capsulitis      Plan:  Patient was evaluated and treated and all questions answered.  # Synovitis and capsulitis of the second and third metatarsophalangeal joint of the right foot -Discussed with patient she does have evidence of inflammation of the second and third MPJs on the right foot -Recommend anti-inflammatory therapies including steroid and meloxicam -E Rx for meloxicam 15 mg take once daily for the next 30 days -I also discussed use of steroid injection for pain control however patient did not want injection at this visit -She would prefer oral steroid.  Therefore E  Rx for methylprednisolone 4 mg steroid taper pack take as directed for 6 days.  Did counsel the patient on the risk of taking oral steroid given history of diabetes and it will raise her blood sugars. -Limit activity and decrease weightbearing time. -Dispensed postoperative shoe for patient to ambulate and for pain reduction -If no improvement discussed we will have to proceed with steroid injection in the area likely and next visit she is to call if it does not improve after the oral steroid and meloxicam  Return if symptoms worsen or fail to improve.          Corinna Gab, DPM [facility] / [facility]

## 2023-02-07 ENCOUNTER — Other Ambulatory Visit: Payer: Self-pay

## 2023-02-28 ENCOUNTER — Ambulatory Visit: Payer: Medicaid Other

## 2023-02-28 ENCOUNTER — Ambulatory Visit: Payer: Medicaid Other | Attending: Family Medicine | Admitting: *Deleted

## 2023-02-28 ENCOUNTER — Other Ambulatory Visit: Payer: Self-pay

## 2023-02-28 DIAGNOSIS — Z3042 Encounter for surveillance of injectable contraceptive: Secondary | ICD-10-CM

## 2023-02-28 MED ORDER — MEDROXYPROGESTERONE ACETATE 150 MG/ML IM SUSY
150.0000 mg | PREFILLED_SYRINGE | INTRAMUSCULAR | Status: AC
Start: 2023-02-28 — End: 2023-02-28
  Administered 2023-02-28: 150 mg via INTRAMUSCULAR

## 2023-02-28 NOTE — Progress Notes (Signed)
Date last pap: 07/03/2022. Last Depo-Provera: 12/13/2022. Side Effects if any: n/a  . Serum HCG indicated? N/a Depo-Provera 150 mg IM given by: T. Malita Ignasiak,RN administered in right gluteus per patient preference. Recommended alternating sites.  Next appointment due December 12- December 26.

## 2023-03-07 ENCOUNTER — Other Ambulatory Visit: Payer: Self-pay

## 2023-03-07 MED ORDER — LISINOPRIL 20 MG PO TABS
20.0000 mg | ORAL_TABLET | Freq: Every day | ORAL | 2 refills | Status: DC
  Filled 2023-03-07: qty 90, 90d supply, fill #0

## 2023-03-07 MED ORDER — INSULIN GLARGINE SOLOSTAR 300 UNIT/ML ~~LOC~~ SOPN: 70.0000 [IU] | PEN_INJECTOR | Freq: Every day | SUBCUTANEOUS | 1 refills | Status: DC

## 2023-03-07 MED ORDER — ATORVASTATIN CALCIUM 20 MG PO TABS
20.0000 mg | ORAL_TABLET | Freq: Every day | ORAL | 3 refills | Status: DC
  Filled 2023-03-07: qty 90, 90d supply, fill #0

## 2023-03-07 MED ORDER — INSULIN ASPART 100 UNIT/ML IJ SOLN
INTRAMUSCULAR | 5 refills | Status: DC
  Filled 2023-03-07 – 2023-04-04 (×3): qty 90, 30d supply, fill #0
  Filled 2023-06-19 (×2): qty 90, 30d supply, fill #1

## 2023-03-19 ENCOUNTER — Other Ambulatory Visit: Payer: Self-pay

## 2023-03-22 ENCOUNTER — Ambulatory Visit (HOSPITAL_COMMUNITY)
Admission: RE | Admit: 2023-03-22 | Discharge: 2023-03-22 | Disposition: A | Discharge status: Home / Self Care | Payer: Medicaid Other | Source: Ambulatory Visit | Attending: Emergency Medicine | Admitting: Emergency Medicine

## 2023-03-22 ENCOUNTER — Encounter (HOSPITAL_COMMUNITY): Payer: Self-pay

## 2023-03-22 VITALS — BP 156/100 | HR 120 | Temp 98.7°F | Resp 16

## 2023-03-22 DIAGNOSIS — I10 Essential (primary) hypertension: Secondary | ICD-10-CM | POA: Diagnosis not present

## 2023-03-22 DIAGNOSIS — R0981 Nasal congestion: Secondary | ICD-10-CM

## 2023-03-22 DIAGNOSIS — J069 Acute upper respiratory infection, unspecified: Secondary | ICD-10-CM | POA: Diagnosis not present

## 2023-03-22 DIAGNOSIS — J302 Other seasonal allergic rhinitis: Secondary | ICD-10-CM

## 2023-03-22 MED ORDER — ALBUTEROL SULFATE (2.5 MG/3ML) 0.083% IN NEBU: 2.5000 mg | INHALATION_SOLUTION | Freq: Four times a day (QID) | RESPIRATORY_TRACT | 12 refills | Status: DC | PRN

## 2023-03-22 MED ORDER — FEXOFENADINE HCL 60 MG PO TABS: 60.0000 mg | ORAL_TABLET | Freq: Two times a day (BID) | ORAL | 0 refills | Status: DC

## 2023-03-22 MED ORDER — OLOPATADINE HCL 0.1 % OP SOLN: 1.0000 [drp] | Freq: Two times a day (BID) | OPHTHALMIC | 0 refills | Status: DC

## 2023-03-22 MED ORDER — TRIAMCINOLONE ACETONIDE 55 MCG/ACT NA AERO: 2.0000 | INHALATION_SPRAY | Freq: Every day | NASAL | 12 refills | Status: DC

## 2023-03-22 NOTE — ED Triage Notes (Signed)
Pt presents with nasal congestion and cough x 2 days

## 2023-03-22 NOTE — ED Provider Notes (Signed)
[facility]    CSN: [id number] Arrival date & time: 03/22/23  1714      History   Chief Complaint Chief Complaint  Patient presents with   Nasal Congestion    Entered by patient    HPI Sarah Garrison is a 42 y.o. female.   Patient has been sick x 2 days with sinus pressure and congestion.  No fever, no excessive mucus, dry cough probably from post nasal drainage.  She has been around a small child with a cold but not other reports of ill contact  The history is provided by the patient.    Past Medical History:  Diagnosis Date   Anxiety    Asthma    Chronic constipation    Chronic shortness of breath    Depression    GERD (gastroesophageal reflux disease)    History of COVID-19 09/29/2019   09-28-2020 result in epic; hospital admission 10-05-2019 for covid pneumonia with hypoxic acute respiratory failure, no oxygen needed when discharged 10-06-2019;  and positive 06-13-2020 result in epic, pt stated no symptoms   History of seizures    (05-04-2021  pt stated was told during hospital admission 05/ 2021 with covid , she had 2 seizures,  stated no medication and no seizure since)   Hypertension    Insulin dependent type 2 diabetes mellitus [facility])    endocrinologist-- Fredia Sorrow NP;   pt uses insulin pump   Insulin pump in place    Mixed hyperlipidemia    OSA (obstructive sleep apnea)    ( 05-04-2021 pt stated has not used cpap since 05/ 2021) followed by dr byrum;  study in epic 02-16-2019 moderate , titrated cpap 08-28-2019   Plantar fasciitis, right    w/ chronic pain    Patient Active Problem List   Diagnosis Date Noted   Acute abdominal pain in left lower quadrant 11/01/2022   Left tennis elbow 08/24/2022   Pain in both knees 08/24/2022   Neurogenic pain of right foot 08/24/2022   Pain of right lower extremity 07/26/2022   Palpitations 07/31/2021   Fatigue 07/31/2021   Primary hypertension 07/31/2021   Hip pain 12/08/2019   Hyperlipidemia  associated with type 2 diabetes mellitus (HCC) 11/23/2019   History of COVID-19 10/27/2019   Stuttering 10/27/2019   Depression with anxiety 10/27/2019   Obesity, Class III, BMI 40-49.9 (morbid obesity) (HCC) 06/30/2019   OSA (obstructive sleep apnea) 02/16/2019   Essential hypertension 06/22/2018   Type 2 diabetes mellitus with hyperosmolarity without coma, with long-term current use of insulin (HCC) 12/13/2016   Asthma 07/30/2016   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/07/2016   DM neuropathy, type II diabetes mellitus (HCC) 08/24/2015    Past Surgical History:  Procedure Laterality Date   CESAREAN SECTION  1999   PLANTAR FASCIA RELEASE Right 05/08/2021   Procedure: ENDOSCOPIC PLANTAR FASCIOTOMY;  Surgeon: Asencion Islam, DPM;  Location: [facility];  Service: Podiatry;  Laterality: Right;  WITH BLOCK   STERIOD INJECTION Bilateral 05/08/2021   Procedure: PLATELET RICH PLASMA INJECTION HEELS;  Surgeon: Asencion Islam, DPM;  Location: [facility];  Service: Podiatry;  Laterality: Bilateral;   TUBAL LIGATION Bilateral 09/29/2006   @[facility] ;   [facility]   VENTRAL HERNIA REPAIR N/A 11/27/2017   Procedure: VENTRAL HERNIA REPAIR ERAS PATHWAY;  Surgeon: Harriette Bouillon, MD;  Location: [facility];  Service: General;  Laterality: N/A;   WISDOM TOOTH EXTRACTION      OB History  Gravida  5   Para  4   Term      Preterm      AB  1   Living  4      SAB  1   IAB      Ectopic      Multiple      Live Births               Home Medications    Prior to Admission medications   Medication Sig Start Date End Date Taking? Authorizing Provider  albuterol (PROVENTIL) (2.5 MG/3ML) 0.083% nebulizer solution Take 3 mLs (2.5 mg total) by nebulization every 6 (six) hours as needed for wheezing or shortness of breath. 03/22/23  Yes Lala Been, Linde Gillis, NP  Continuous Blood Gluc Sensor (DEXCOM G6 SENSOR) MISC USE AND CHANGE SENSOR EVERY 10 DAYS AS DIRECTED  03/14/22  Yes [provider]  Continuous Blood Gluc Sensor (DEXCOM G6 SENSOR) MISC SMARTSIG:Topical Every 10 Days   Yes [provider]  Continuous Blood Gluc Transmit (DEXCOM G6 TRANSMITTER) MISC USE TO CHECK BLOOD SUGAR EVERY DAY. CHANGE EVERY 3 MONTHS AS DIRECTED 03/14/22  Yes [provider]  famotidine (PEPCID) 20 MG tablet Take 1 tablet (20 mg total) by mouth daily. 11/01/22  Yes Theresia Lo, Victoria K, DO  fexofenadine (ALLEGRA) 60 MG tablet Take 1 tablet (60 mg total) by mouth 2 (two) times daily. 03/22/23  Yes Jewelz Ricklefs, Linde Gillis, NP  insulin aspart (NOVOLOG FLEXPEN) 100 UNIT/ML FlexPen Inject into the skin 3 (three) times daily before meals. ONLY WHEN INSULIN PUMP MALFUNCTION'S PER ENDOCRINOLOGY DIRECTION   Yes [provider]  insulin aspart (NOVOLOG) 100 UNIT/ML injection USE VIA INSULIN PUMP. MAX TOTAL DAILY DOSE OF 200 UNITS 11/13/21  Yes   insulin aspart (NOVOLOG) 100 UNIT/ML injection USE VIA INSULIN PUMP. MAX TOTAL DAILY DOSE OF 250 UNITS 07/30/22  Yes   Insulin Glargine Solostar 300 UNIT/ML SOPN Inject 70 Units into the skin daily. Only if insullin pump malfunction. 11/27/22  Yes   Insulin Glargine Solostar 300 UNIT/ML SOPN Inject 70 Units as directed daily. 03/07/23  Yes   Insulin Human (INSULIN PUMP) SOLN Inject 1 each into the skin See admin instructions. Medication: Novolog 100 units/ml injection. Takes 100 units via pump per patient.   Yes [provider]  OZEMPIC, 0.25 OR 0.5 MG/DOSE, 2 MG/3ML SOPN Inject into the skin. 06/13/22  Yes [provider]  pantoprazole (PROTONIX) 40 MG tablet Take 1 tablet (40 mg total) by mouth daily. 10/23/22  Yes Hoy Register, MD  triamcinolone (NASACORT) 55 MCG/ACT AERO nasal inhaler Place 2 sprays into the nose daily. 03/22/23  Yes Mallarie Voorhies, Linde Gillis, NP  atorvastatin (LIPITOR) 20 MG tablet Take 1 tablet (20 mg total) by mouth daily. 03/07/23     atorvastatin (LIPITOR) 40 MG tablet Take 1 tablet (40 mg  total) by mouth daily. 05/31/22   Hoy Register, MD  insulin aspart (NOVOLOG) 100 UNIT/ML injection USE VIA INSULIN PUMP. MAX TOTAL DAILY DOSE OF 300 UNITS 03/07/23     Insulin Syringe-Needle U-100 31G X 5/16" 1 ML MISC inject 10 units into the skin 3 times daily. use to inject novolog based on insulin to card ratio- max daily dose 150 units 09/23/20     lisinopril (ZESTRIL) 20 MG tablet Take 1 tablet (20 mg total) by mouth daily. 03/07/23     olopatadine (PATANOL) 0.1 % ophthalmic solution Place 1 drop into both eyes 2 (two) times daily. 03/22/23   Kathalene Sporer,  Linde Gillis, NP  ARIPiprazole (ABILIFY) 5 MG tablet Take 1 tablet (5 mg total) by mouth daily. 10/27/19 06/13/20  Sater, Pearletha Furl, MD    Family History Family History  Problem Relation Age of Onset   Asthma Mother    Kidney failure Mother    Brain cancer Mother    Asthma Father    Other Father        surgery on stomach but don't know from what   Breast cancer Maternal Grandmother    Diabetes Maternal Grandmother    Diabetes Brother     Social History Social History   Tobacco Use   Smoking status: Some Days    Current packs/day: 0.00    Average packs/day: 0.1 packs/day for 2.0 years (0.2 ttl pk-yrs)    Types: Cigarettes, E-cigarettes    Start date: 11/28/2015    Last attempt to quit: 11/27/2017    Years since quitting: 5.3   Smokeless tobacco: Never   Tobacco comments:    I vape every now and then.  Vaping Use   Vaping status: Some Days   Devices: Elfvar  (05-04-2021 per pt last vaped approx 2 wks ago)  Substance Use Topics   Alcohol use: No   Drug use: Never     Allergies   Morphine and codeine, Glyburide, Hydrocodone, Ivp dye [iodinated contrast media], Mupirocin, and Oxycodone   Review of Systems Review of Systems  Constitutional:  Positive for fatigue.  HENT:  Positive for congestion, postnasal drip and rhinorrhea.        Itchy throat   Eyes:  Positive for itching.  Respiratory:         Intermittent dry cough   All other systems reviewed and are negative.    Physical Exam Triage Vital Signs ED Triage Vitals [03/22/23 1733]  Encounter Vitals Group     BP (!) 156/100     Systolic BP Percentile      Diastolic BP Percentile      Pulse Rate (!) 120     Resp 16     Temp 98.7 F (37.1 C)     Temp Source Oral     SpO2 97 %     Weight      Height      Head Circumference      Peak Flow      Pain Score      Pain Loc      Pain Education      Exclude from Growth Chart    No data found.  Updated Vital Signs BP (!) 156/100 (BP Location: Left Arm)   Pulse (!) 120   Temp 98.7 F (37.1 C) (Oral)   Resp 16   SpO2 97%   Visual Acuity Right Eye Distance:   Left Eye Distance:   Bilateral Distance:    Right Eye Near:   Left Eye Near:    Bilateral Near:     Physical Exam Vitals and nursing note reviewed.  Constitutional:      Appearance: She is obese.  HENT:     Right Ear: Tympanic membrane normal.     Left Ear: Tympanic membrane normal.     Nose: Congestion present.     Mouth/Throat:     Mouth: Mucous membranes are moist.  Eyes:     Conjunctiva/sclera: Conjunctivae normal.  Cardiovascular:     Rate and Rhythm: Normal rate and regular rhythm.     Pulses: Normal pulses.     Heart sounds: Normal heart  sounds.  Pulmonary:     Effort: Pulmonary effort is normal.     Breath sounds: Normal breath sounds.  Neurological:     Mental Status: She is alert.      UC Treatments / Results  Labs (all labs ordered are listed, but only abnormal results are displayed) Labs Reviewed - No data to display  EKG   Radiology No results found.  Procedures Procedures (including critical care time)  Medications Ordered in UC Medications - No data to display  Initial Impression / Assessment and Plan / UC Course  I have reviewed the triage vital signs and the nursing notes.  Pertinent labs & imaging results that were available during my care of the patient were reviewed by me and  considered in my medical decision making (see chart for details).   Sign and symptoms of upper respiratory virus. No s/s of need for abx.  Negative for fever, sinus pressure on palpation, erythemic throat.   She has been using flonase and zrytec without effectiveness.  We will change these medications to Allegra and nasacort.  Educated on usage. I have reordered her albuterol for intermittent asthma as she is out.  She normally uses her inhaler less than weekly.  I have also reordered her olopatadine for itchy eyes.  Hypertension not controlled - has not taken prescribed medication x 6 days.  She is diabetic and on insulin.  Reiterated need to take medication appropriately and risk of uncontrolled hypertension.  We will continue conservative measures.    Final Clinical Impressions(s) / UC Diagnoses   Final diagnoses:  Nasal congestion  Upper respiratory virus  Hypertension, unspecified type  Seasonal allergies     Discharge Instructions      Stop flonase and zrytec.  START Allegra and Nasacort. Inhaler reordered Blood pressure uncontrolled.  Please take medication as prescribed      ED Prescriptions     Medication Sig Dispense Auth. Provider   triamcinolone (NASACORT) 55 MCG/ACT AERO nasal inhaler Place 2 sprays into the nose daily. 1 each Gustavo Meditz, Linde Gillis, NP   fexofenadine (ALLEGRA) 60 MG tablet Take 1 tablet (60 mg total) by mouth 2 (two) times daily. 60 tablet Randale Carvalho, Linde Gillis, NP   albuterol (PROVENTIL) (2.5 MG/3ML) 0.083% nebulizer solution Take 3 mLs (2.5 mg total) by nebulization every 6 (six) hours as needed for wheezing or shortness of breath. 75 mL Berley Gambrell M, NP   olopatadine (PATANOL) 0.1 % ophthalmic solution Place 1 drop into both eyes 2 (two) times daily. 5 mL Alis Sawchuk, Linde Gillis, NP      PDMP not reviewed this encounter.   Nelda Marseille, NP 03/22/23 2036

## 2023-03-22 NOTE — Discharge Instructions (Addendum)
Stop flonase and zrytec.  START Allegra and Nasacort. Inhaler reordered Blood pressure uncontrolled.  Please take medication as prescribed

## 2023-04-04 ENCOUNTER — Ambulatory Visit (HOSPITAL_COMMUNITY)
Admission: RE | Admit: 2023-04-04 | Discharge: 2023-04-04 | Disposition: A | Discharge status: Home / Self Care | Payer: Medicaid Other | Source: Ambulatory Visit | Attending: Family Medicine | Admitting: Family Medicine

## 2023-04-04 ENCOUNTER — Encounter (HOSPITAL_COMMUNITY): Payer: Self-pay

## 2023-04-04 ENCOUNTER — Other Ambulatory Visit: Payer: Self-pay

## 2023-04-04 ENCOUNTER — Other Ambulatory Visit (HOSPITAL_COMMUNITY): Payer: Self-pay

## 2023-04-04 VITALS — BP 139/96 | HR 99 | Temp 98.3°F | Resp 16

## 2023-04-04 DIAGNOSIS — L239 Allergic contact dermatitis, unspecified cause: Secondary | ICD-10-CM

## 2023-04-04 DIAGNOSIS — L03113 Cellulitis of right upper limb: Secondary | ICD-10-CM | POA: Diagnosis not present

## 2023-04-04 DIAGNOSIS — J4520 Mild intermittent asthma, uncomplicated: Secondary | ICD-10-CM

## 2023-04-04 MED ORDER — CEPHALEXIN 500 MG PO CAPS
500.0000 mg | ORAL_CAPSULE | Freq: Three times a day (TID) | ORAL | 0 refills | Status: AC
  Filled 2023-04-04: qty 21, 7d supply, fill #0

## 2023-04-04 MED ORDER — FLUCONAZOLE 150 MG PO TABS
150.0000 mg | ORAL_TABLET | ORAL | 0 refills | Status: AC
  Filled 2023-04-04: qty 2, 6d supply, fill #0

## 2023-04-04 MED ORDER — ALBUTEROL SULFATE HFA 108 (90 BASE) MCG/ACT IN AERS
2.0000 | INHALATION_SPRAY | RESPIRATORY_TRACT | 0 refills | Status: DC | PRN
  Filled 2023-04-04: qty 18, 17d supply, fill #0

## 2023-04-04 MED ORDER — TRIAMCINOLONE ACETONIDE 0.1 % EX CREA
1.0000 | TOPICAL_CREAM | Freq: Two times a day (BID) | CUTANEOUS | 0 refills | Status: DC
  Filled 2023-04-04: qty 80, 30d supply, fill #0

## 2023-04-04 NOTE — ED Provider Notes (Signed)
[facility]    CSN: [id number] Arrival date & time: 04/04/23  8413      History   Chief Complaint Chief Complaint  Patient presents with   Rash    have a rash on the back of my neck - Entered by patient   Arm Swelling    HPI Sarah Garrison is a 42 y.o. female.    Rash Here for rash and itching on the back of her neck that is been going on for about 2 weeks.  She also notes some swelling and discomfort in her right arm.  She placed her Dexcom there 2 days ago.  She was seen a couple of weeks ago and was prescribed albuterol, but she needed an inhaler form and not for the nebulizer.  Asthma overall doing okay right now.  She states that she is prescribed antibiotics she gets a yeast infection  Past Medical History:  Diagnosis Date   Anxiety    Asthma    Chronic constipation    Chronic shortness of breath    Depression    GERD (gastroesophageal reflux disease)    History of COVID-19 09/29/2019   09-28-2020 result in epic; hospital admission 10-05-2019 for covid pneumonia with hypoxic acute respiratory failure, no oxygen needed when discharged 10-06-2019;  and positive 06-13-2020 result in epic, pt stated no symptoms   History of seizures    (05-04-2021  pt stated was told during hospital admission 05/ 2021 with covid , she had 2 seizures,  stated no medication and no seizure since)   Hypertension    Insulin dependent type 2 diabetes mellitus [facility])    endocrinologist-- Fredia Sorrow NP;   pt uses insulin pump   Insulin pump in place    Mixed hyperlipidemia    OSA (obstructive sleep apnea)    ( 05-04-2021 pt stated has not used cpap since 05/ 2021) followed by dr byrum;  study in epic 02-16-2019 moderate , titrated cpap 08-28-2019   Plantar fasciitis, right    w/ chronic pain    Patient Active Problem List   Diagnosis Date Noted   Acute abdominal pain in left lower quadrant 11/01/2022   Left tennis elbow 08/24/2022   Pain in both knees 08/24/2022    Neurogenic pain of right foot 08/24/2022   Pain of right lower extremity 07/26/2022   Palpitations 07/31/2021   Fatigue 07/31/2021   Primary hypertension 07/31/2021   Hip pain 12/08/2019   Hyperlipidemia associated with type 2 diabetes mellitus (HCC) 11/23/2019   History of COVID-19 10/27/2019   Stuttering 10/27/2019   Depression with anxiety 10/27/2019   Obesity, Class III, BMI 40-49.9 (morbid obesity) (HCC) 06/30/2019   OSA (obstructive sleep apnea) 02/16/2019   Essential hypertension 06/22/2018   Type 2 diabetes mellitus with hyperosmolarity without coma, with long-term current use of insulin (HCC) 12/13/2016   Asthma 07/30/2016   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/07/2016   DM neuropathy, type II diabetes mellitus (HCC) 08/24/2015    Past Surgical History:  Procedure Laterality Date   CESAREAN SECTION  1999   PLANTAR FASCIA RELEASE Right 05/08/2021   Procedure: ENDOSCOPIC PLANTAR FASCIOTOMY;  Surgeon: Asencion Islam, DPM;  Location: [facility];  Service: Podiatry;  Laterality: Right;  WITH BLOCK   STERIOD INJECTION Bilateral 05/08/2021   Procedure: PLATELET RICH PLASMA INJECTION HEELS;  Surgeon: Asencion Islam, DPM;  Location: [facility];  Service: Podiatry;  Laterality: Bilateral;   TUBAL LIGATION Bilateral 09/29/2006   @[facility] ;   [facility]  VENTRAL HERNIA REPAIR N/A 11/27/2017   Procedure: VENTRAL HERNIA REPAIR ERAS PATHWAY;  Surgeon: Harriette Bouillon, MD;  Location: [facility];  Service: General;  Laterality: N/A;   WISDOM TOOTH EXTRACTION      OB History     Gravida  5   Para  4   Term      Preterm      AB  1   Living  4      SAB  1   IAB      Ectopic      Multiple      Live Births               Home Medications    Prior to Admission medications   Medication Sig Start Date End Date Taking? Authorizing Provider  albuterol (VENTOLIN HFA) 108 (90 Base) MCG/ACT inhaler Inhale 2 puffs into the lungs every 4 (four) hours  as needed for wheezing or shortness of breath. 04/04/23  Yes Aleia Larocca, Janace Aris, MD  cephALEXin (KEFLEX) 500 MG capsule Take 1 capsule (500 mg total) by mouth 3 (three) times daily for 7 days. 04/04/23 04/11/23 Yes Zenia Resides, MD  fluconazole (DIFLUCAN) 150 MG tablet Take 1 tablet (150 mg total) by mouth every 3 (three) days for 2 doses. 04/04/23 04/10/23 Yes Zenia Resides, MD  triamcinolone cream (KENALOG) 0.1 % Apply 1 Application topically 2 (two) times daily. To affected area till better 04/04/23  Yes Aziel Morgan, Janace Aris, MD  atorvastatin (LIPITOR) 20 MG tablet Take 1 tablet (20 mg total) by mouth daily. 03/07/23     atorvastatin (LIPITOR) 40 MG tablet Take 1 tablet (40 mg total) by mouth daily. 05/31/22   Hoy Register, MD  Continuous Blood Gluc Sensor (DEXCOM G6 SENSOR) MISC USE AND CHANGE SENSOR EVERY 10 DAYS AS DIRECTED 03/14/22   [provider]  Continuous Blood Gluc Sensor (DEXCOM G6 SENSOR) MISC SMARTSIG:Topical Every 10 Days    [provider]  Continuous Blood Gluc Transmit (DEXCOM G6 TRANSMITTER) MISC USE TO CHECK BLOOD SUGAR EVERY DAY. CHANGE EVERY 3 MONTHS AS DIRECTED 03/14/22   [provider]  famotidine (PEPCID) 20 MG tablet Take 1 tablet (20 mg total) by mouth daily. 11/01/22   Elayne Snare K, DO  fexofenadine (ALLEGRA) 60 MG tablet Take 1 tablet (60 mg total) by mouth 2 (two) times daily. 03/22/23   Blitch, Linde Gillis, NP  insulin aspart (NOVOLOG FLEXPEN) 100 UNIT/ML FlexPen Inject into the skin 3 (three) times daily before meals. ONLY WHEN INSULIN PUMP MALFUNCTION'S PER ENDOCRINOLOGY DIRECTION    [provider]  insulin aspart (NOVOLOG) 100 UNIT/ML injection USE VIA INSULIN PUMP. MAX TOTAL DAILY DOSE OF 200 UNITS 11/13/21     insulin aspart (NOVOLOG) 100 UNIT/ML injection USE VIA INSULIN PUMP. MAX TOTAL DAILY DOSE OF 250 UNITS 07/30/22     insulin aspart (NOVOLOG) 100 UNIT/ML injection USE VIA INSULIN PUMP. MAX TOTAL DAILY DOSE OF  300 UNITS 03/07/23     Insulin Glargine Solostar 300 UNIT/ML SOPN Inject 70 Units into the skin daily. Only if insullin pump malfunction. 11/27/22     Insulin Glargine Solostar 300 UNIT/ML SOPN Inject 70 Units as directed daily. 03/07/23     Insulin Human (INSULIN PUMP) SOLN Inject 1 each into the skin See admin instructions. Medication: Novolog 100 units/ml injection. Takes 100 units via pump per patient.    [provider]  Insulin Syringe-Needle U-100 31G X 5/16" 1 ML MISC inject 10 units  into the skin 3 times daily. use to inject novolog based on insulin to card ratio- max daily dose 150 units 09/23/20     lisinopril (ZESTRIL) 20 MG tablet Take 1 tablet (20 mg total) by mouth daily. 03/07/23     olopatadine (PATANOL) 0.1 % ophthalmic solution Place 1 drop into both eyes 2 (two) times daily. 03/22/23   Blitch, Linde Gillis, NP  OZEMPIC, 0.25 OR 0.5 MG/DOSE, 2 MG/3ML SOPN Inject into the skin. 06/13/22   [provider]  pantoprazole (PROTONIX) 40 MG tablet Take 1 tablet (40 mg total) by mouth daily. 10/23/22   Hoy Register, MD  triamcinolone (NASACORT) 55 MCG/ACT AERO nasal inhaler Place 2 sprays into the nose daily. 03/22/23   Blitch, Linde Gillis, NP  ARIPiprazole (ABILIFY) 5 MG tablet Take 1 tablet (5 mg total) by mouth daily. 10/27/19 06/13/20  Sater, Pearletha Furl, MD    Family History Family History  Problem Relation Age of Onset   Asthma Mother    Kidney failure Mother    Brain cancer Mother    Asthma Father    Other Father        surgery on stomach but don't know from what   Breast cancer Maternal Grandmother    Diabetes Maternal Grandmother    Diabetes Brother     Social History Social History   Tobacco Use   Smoking status: Former    Current packs/day: 0.00    Average packs/day: 0.1 packs/day for 2.0 years (0.2 ttl pk-yrs)    Types: Cigarettes, E-cigarettes    Start date: 11/28/2015    Quit date: 11/27/2017    Years since quitting: 5.3   Smokeless tobacco: Never    Tobacco comments:    I vape every now and then.  Vaping Use   Vaping status: Former   Devices: Elfvar  (05-04-2021 per pt last vaped approx 2 wks ago)  Substance Use Topics   Alcohol use: No   Drug use: Never     Allergies   Morphine and codeine, Glyburide, Hydrocodone, Ivp dye [iodinated contrast media], Mupirocin, and Oxycodone   Review of Systems Review of Systems  Skin:  Positive for rash.     Physical Exam Triage Vital Signs ED Triage Vitals [04/04/23 1004]  Encounter Vitals Group     BP (!) 139/96     Systolic BP Percentile      Diastolic BP Percentile      Pulse Rate 99     Resp 16     Temp 98.3 F (36.8 C)     Temp Source Oral     SpO2 96 %     Weight      Height      Head Circumference      Peak Flow      Pain Score 0     Pain Loc      Pain Education      Exclude from Growth Chart    No data found.  Updated Vital Signs BP (!) 139/96 (BP Location: Left Arm)   Pulse 99   Temp 98.3 F (36.8 C) (Oral)   Resp 16   SpO2 96%   Visual Acuity Right Eye Distance:   Left Eye Distance:   Bilateral Distance:    Right Eye Near:   Left Eye Near:    Bilateral Near:     Physical Exam Vitals reviewed.  Constitutional:      General: She is not in acute distress.    Appearance:  She is not ill-appearing, toxic-appearing or diaphoretic.  HENT:     Mouth/Throat:     Mouth: Mucous membranes are moist.     Pharynx: No oropharyngeal exudate or posterior oropharyngeal erythema.  Eyes:     Extraocular Movements: Extraocular movements intact.     Conjunctiva/sclera: Conjunctivae normal.     Pupils: Pupils are equal, round, and reactive to light.  Cardiovascular:     Rate and Rhythm: Normal rate and regular rhythm.     Heart sounds: No murmur heard. Pulmonary:     Effort: Pulmonary effort is normal.     Breath sounds: Normal breath sounds.  Musculoskeletal:     Cervical back: Neck supple.     Comments: There is some mild's edema of her right upper arm and  a little bit of discoloration posteriorly.  There is no skin induration.   Lymphadenopathy:     Cervical: No cervical adenopathy.  Skin:    Coloration: Skin is not pale.     Comments: There is a little bit of mildly erythematous raised rash on her posterior neck distributed posterior laterally. No sign of secondary infection or ulceration.  Neurological:     General: No focal deficit present.     Mental Status: She is alert and oriented to person, place, and time.  Psychiatric:        Behavior: Behavior normal.      UC Treatments / Results  Labs (all labs ordered are listed, but only abnormal results are displayed) Labs Reviewed - No data to display  EKG   Radiology No results found.  Procedures Procedures (including critical care time)  Medications Ordered in UC Medications - No data to display  Initial Impression / Assessment and Plan / UC Course  I have reviewed the triage vital signs and the nursing notes.  Pertinent labs & imaging results that were available during my care of the patient were reviewed by me and considered in my medical decision making (see chart for details).   When I mention that she might need to try not wearing her necklaces she first tells me she is already tried that, but then states that she cleaned them with some chemicals and then put them back on.  Triamcinolone cream is sent in to treat the rash.  Keflex is sent in for possible cellulitis of the arm, and I have asked her to remove the Dexcom from that area and put it somewhere else.  She should also call her endocrinologist.  Fluconazole was sent in for potential yeast infection with antibiotic.  Albuterol is sent in an inhaler form  Final Clinical Impressions(s) / UC Diagnoses   Final diagnoses:  Allergic contact dermatitis, unspecified trigger  Cellulitis of right upper extremity  Mild intermittent asthma without complication     Discharge Instructions      Take cephalexin  250 mg--1 capsule 3 times daily for 7 days  Take fluconazole 150 mg--1 tablet every 3 days for 2 doses  Albuterol inhaler--do 2 puffs every 4 hours as needed for shortness of breath or wheezing  Triamcinolone cream--apply 2 times daily to the rash area until better, about 10 to 14 days.  I still think it would help not to wear any necklaces for a few days.  Please call your endocrinologist about the Dexcom.     ED Prescriptions     Medication Sig Dispense Auth. Provider   albuterol (VENTOLIN HFA) 108 (90 Base) MCG/ACT inhaler Inhale 2 puffs into the lungs every  4 (four) hours as needed for wheezing or shortness of breath. 18 g Zenia Resides, MD   cephALEXin (KEFLEX) 500 MG capsule Take 1 capsule (500 mg total) by mouth 3 (three) times daily for 7 days. 21 capsule Zenia Resides, MD   fluconazole (DIFLUCAN) 150 MG tablet Take 1 tablet (150 mg total) by mouth every 3 (three) days for 2 doses. 2 tablet Zenia Resides, MD   triamcinolone cream (KENALOG) 0.1 % Apply 1 Application topically 2 (two) times daily. To affected area till better 80 g Marlinda Mike Janace Aris, MD      PDMP not reviewed this encounter.   Zenia Resides, MD 04/04/23 1024

## 2023-04-04 NOTE — Discharge Instructions (Addendum)
Take cephalexin 250 mg--1 capsule 3 times daily for 7 days  Take fluconazole 150 mg--1 tablet every 3 days for 2 doses  Albuterol inhaler--do 2 puffs every 4 hours as needed for shortness of breath or wheezing  Triamcinolone cream--apply 2 times daily to the rash area until better, about 10 to 14 days.  I still think it would help not to wear any necklaces for a few days.  Please call your endocrinologist about the Dexcom.

## 2023-04-04 NOTE — ED Triage Notes (Addendum)
Patient c/o a rash and itching posterior neck x 2 weeks.  Patient states she has been using eczema cream and alcohol.  Patient added that she is having right arm swelling and discomfort from her Dexcom since putting it on 2 days go.

## 2023-04-20 ENCOUNTER — Other Ambulatory Visit: Payer: Self-pay

## 2023-04-20 ENCOUNTER — Emergency Department (HOSPITAL_COMMUNITY)
Admission: EM | Admit: 2023-04-20 | Discharge: 2023-04-20 | Disposition: A | Discharge status: Home / Self Care | Payer: Medicaid Other | Attending: Emergency Medicine | Admitting: Emergency Medicine

## 2023-04-20 ENCOUNTER — Emergency Department (HOSPITAL_COMMUNITY): Payer: Medicaid Other

## 2023-04-20 ENCOUNTER — Encounter (HOSPITAL_COMMUNITY): Payer: Self-pay

## 2023-04-20 DIAGNOSIS — R0602 Shortness of breath: Secondary | ICD-10-CM | POA: Diagnosis not present

## 2023-04-20 DIAGNOSIS — T1490XA Injury, unspecified, initial encounter: Secondary | ICD-10-CM

## 2023-04-20 DIAGNOSIS — Z7951 Long term (current) use of inhaled steroids: Secondary | ICD-10-CM | POA: Diagnosis not present

## 2023-04-20 DIAGNOSIS — R059 Cough, unspecified: Secondary | ICD-10-CM | POA: Diagnosis not present

## 2023-04-20 DIAGNOSIS — Z794 Long term (current) use of insulin: Secondary | ICD-10-CM | POA: Diagnosis not present

## 2023-04-20 DIAGNOSIS — J45909 Unspecified asthma, uncomplicated: Secondary | ICD-10-CM | POA: Diagnosis not present

## 2023-04-20 DIAGNOSIS — I10 Essential (primary) hypertension: Secondary | ICD-10-CM | POA: Diagnosis not present

## 2023-04-20 DIAGNOSIS — T5991XA Toxic effect of unspecified gases, fumes and vapors, accidental (unintentional), initial encounter: Secondary | ICD-10-CM | POA: Diagnosis present

## 2023-04-20 MED ORDER — DIPHENHYDRAMINE HCL 25 MG PO CAPS
25.0000 mg | ORAL_CAPSULE | Freq: Once | ORAL | Status: AC
  Administered 2023-04-20: 25 mg via ORAL
  Filled 2023-04-20: qty 1
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
## 2023-05-16 ENCOUNTER — Ambulatory Visit: Payer: Medicaid Other | Attending: Family Medicine

## 2023-05-16 DIAGNOSIS — Z3042 Encounter for surveillance of injectable contraceptive: Secondary | ICD-10-CM

## 2023-05-16 MED ORDER — MEDROXYPROGESTERONE ACETATE 150 MG/ML IM SUSP
150.0000 mg | Freq: Once | INTRAMUSCULAR | Status: AC
  Administered 2023-05-16: 150 mg via INTRAMUSCULAR

## 2023-05-16 NOTE — Progress Notes (Signed)
Pt was given depo injection in  right ventrogluteal. Pt tolerated shot well Urine HCG needed:NO Pt to return feb 27-mar 13

## 2023-05-27 ENCOUNTER — Encounter: Payer: Self-pay | Admitting: Family Medicine

## 2023-05-27 NOTE — Telephone Encounter (Signed)
 Care team updated and letter sent for eye exam notes.

## 2023-06-10 ENCOUNTER — Ambulatory Visit (HOSPITAL_COMMUNITY)
Admission: EM | Admit: 2023-06-10 | Discharge: 2023-06-10 | Disposition: A | Discharge status: Home / Self Care | Payer: Medicaid Other | Attending: Family Medicine | Admitting: Family Medicine

## 2023-06-10 ENCOUNTER — Other Ambulatory Visit: Payer: Self-pay

## 2023-06-10 ENCOUNTER — Encounter (HOSPITAL_COMMUNITY): Payer: Self-pay | Admitting: *Deleted

## 2023-06-10 DIAGNOSIS — G43C1 Periodic headache syndromes in child or adult, intractable: Secondary | ICD-10-CM

## 2023-06-10 DIAGNOSIS — G5603 Carpal tunnel syndrome, bilateral upper limbs: Secondary | ICD-10-CM | POA: Diagnosis not present

## 2023-06-10 MED ORDER — KETOROLAC TROMETHAMINE 30 MG/ML IJ SOLN
INTRAMUSCULAR | Status: AC
  Filled 2023-06-10: qty 1

## 2023-06-10 MED ORDER — SUMATRIPTAN SUCCINATE 6 MG/0.5ML ~~LOC~~ SOLN
SUBCUTANEOUS | Status: AC
  Filled 2023-06-10: qty 0.5

## 2023-06-10 MED ORDER — ONDANSETRON 4 MG PO TBDP
ORAL_TABLET | ORAL | Status: AC
  Filled 2023-06-10: qty 1

## 2023-06-10 MED ORDER — SUMATRIPTAN SUCCINATE 6 MG/0.5ML ~~LOC~~ SOLN
6.0000 mg | Freq: Once | SUBCUTANEOUS | Status: AC
  Administered 2023-06-10: 6 mg via SUBCUTANEOUS

## 2023-06-10 MED ORDER — KETOROLAC TROMETHAMINE 30 MG/ML IJ SOLN
30.0000 mg | Freq: Once | INTRAMUSCULAR | Status: AC
  Administered 2023-06-10: 30 mg via INTRAMUSCULAR

## 2023-06-10 MED ORDER — ONDANSETRON 4 MG PO TBDP
4.0000 mg | ORAL_TABLET | Freq: Once | ORAL | Status: AC
  Administered 2023-06-10: 4 mg via ORAL

## 2023-06-10 NOTE — Discharge Instructions (Signed)
 You have been given a shot of Toradol 30 mg and of sumatriptan 6 mg today.  We have also given you 1 dose of ondansetron for the nausea.

## 2023-06-10 NOTE — ED Provider Notes (Signed)
 [facility]    CSN: [id number] Arrival date & time: 06/10/23  1716      History   Chief Complaint Chief Complaint  Patient presents with   Headache    HPI Sarah Garrison is a 43 y.o. female.    Headache  Here for frontal headache that is been going on since January 3.  It is throbbing and she is nauseated with it and is thrown up once.  She does have photophobia.  No fever or cough  She does have a history of migraines   she also notes bilateral hand pain and some numbness coming and going.   this has been worse since she is not been able to find her braces for her carpal tunnel.     Past Medical History:  Diagnosis Date   Anxiety    Asthma    Chronic constipation    Chronic shortness of breath    Depression    GERD (gastroesophageal reflux disease)    History of COVID-19 09/29/2019   09-28-2020 result in epic; hospital admission 10-05-2019 for covid pneumonia with hypoxic acute respiratory failure, no oxygen needed when discharged 10-06-2019;  and positive 06-13-2020 result in epic, pt stated no symptoms   History of seizures    (05-04-2021  pt stated was told during hospital admission 05/ 2021 with covid , she had 2 seizures,  stated no medication and no seizure since)   Hypertension    Insulin  dependent type 2 diabetes mellitus [facility])    endocrinologist-- warren batty NP;   pt uses insulin  pump   Insulin  pump in place    Mixed hyperlipidemia    OSA (obstructive sleep apnea)    ( 05-04-2021 pt stated has not used cpap since 05/ 2021) followed by dr byrum;  study in epic 02-16-2019 moderate , titrated cpap 08-28-2019   Plantar fasciitis, right    w/ chronic pain    Patient Active Problem List   Diagnosis Date Noted   Acute abdominal pain in left lower quadrant 11/01/2022   Left tennis elbow 08/24/2022   Pain in both knees 08/24/2022   Neurogenic pain of right foot 08/24/2022   Pain of right lower extremity 07/26/2022   Palpitations 07/31/2021    Fatigue 07/31/2021   Primary hypertension 07/31/2021   Hip pain 12/08/2019   Hyperlipidemia associated with type 2 diabetes mellitus (HCC) 11/23/2019   History of COVID-19 10/27/2019   Stuttering 10/27/2019   Depression with anxiety 10/27/2019   Obesity, Class III, BMI 40-49.9 (morbid obesity) (HCC) 06/30/2019   OSA (obstructive sleep apnea) 02/16/2019   Essential hypertension 06/22/2018   Type 2 diabetes mellitus with hyperosmolarity without coma, with long-term current use of insulin  (HCC) 12/13/2016   Asthma 07/30/2016   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/07/2016   DM neuropathy, type II diabetes mellitus (HCC) 08/24/2015    Past Surgical History:  Procedure Laterality Date   CESAREAN SECTION  1999   PLANTAR FASCIA RELEASE Right 05/08/2021   Procedure: ENDOSCOPIC PLANTAR FASCIOTOMY;  Surgeon: Burt Fus, DPM;  Location: [facility];  Service: Podiatry;  Laterality: Right;  WITH BLOCK   STERIOD INJECTION Bilateral 05/08/2021   Procedure: PLATELET RICH PLASMA INJECTION HEELS;  Surgeon: Stover, Titorya, DPM;  Location: [facility];  Service: Podiatry;  Laterality: Bilateral;   TUBAL LIGATION Bilateral 09/29/2006   @[facility] ;   [facility]   VENTRAL HERNIA REPAIR N/A 11/27/2017   Procedure: VENTRAL HERNIA REPAIR ERAS PATHWAY;  Surgeon: Vanderbilt Ned, MD;  Location: [facility]  ;  Service: General;  Laterality: N/A;   WISDOM TOOTH EXTRACTION      OB History     Gravida  5   Para  4   Term      Preterm      AB  1   Living  4      SAB  1   IAB      Ectopic      Multiple      Live Births               Home Medications    Prior to Admission medications   Medication Sig Start Date End Date Taking? Authorizing Provider  albuterol  (VENTOLIN  HFA) 108 (90 Base) MCG/ACT inhaler Inhale 2 puffs into the lungs every 4 (four) hours as needed for wheezing or shortness of breath. 04/04/23  Yes Vonna Sharlet POUR, MD  atorvastatin  (LIPITOR) 40  MG tablet Take 1 tablet (40 mg total) by mouth daily. 05/31/22  Yes Newlin, Corrina, MD  Continuous Blood Gluc Sensor (DEXCOM G6 SENSOR) MISC USE AND CHANGE SENSOR EVERY 10 DAYS AS DIRECTED 03/14/22  Yes [provider]  Continuous Blood Gluc Sensor (DEXCOM G6 SENSOR) MISC SMARTSIG:Topical Every 10 Days   Yes [provider]  Continuous Blood Gluc Transmit (DEXCOM G6 TRANSMITTER) MISC USE TO CHECK BLOOD SUGAR EVERY DAY. CHANGE EVERY 3 MONTHS AS DIRECTED 03/14/22  Yes [provider]  famotidine  (PEPCID ) 20 MG tablet Take 1 tablet (20 mg total) by mouth daily. 11/01/22  Yes Kingsley, Victoria K, DO  fexofenadine  (ALLEGRA ) 60 MG tablet Take 1 tablet (60 mg total) by mouth 2 (two) times daily. 03/22/23  Yes Blitch, Marval HERO, NP  insulin  aspart (NOVOLOG  FLEXPEN) 100 UNIT/ML FlexPen Inject into the skin 3 (three) times daily before meals. ONLY WHEN INSULIN  PUMP MALFUNCTION'S PER ENDOCRINOLOGY DIRECTION   Yes [provider]  insulin  aspart (NOVOLOG ) 100 UNIT/ML injection USE VIA INSULIN  PUMP. MAX TOTAL DAILY DOSE OF 200 UNITS 11/13/21  Yes   insulin  aspart (NOVOLOG ) 100 UNIT/ML injection USE VIA INSULIN  PUMP. MAX TOTAL DAILY DOSE OF 250 UNITS 07/30/22  Yes   Insulin  Glargine Solostar 300 UNIT/ML SOPN Inject 70 Units as directed daily. 03/07/23  Yes   Insulin  Human (INSULIN  PUMP) SOLN Inject 1 each into the skin See admin instructions. Medication: Novolog  100 units/ml injection. Takes 100 units via pump per patient.   Yes [provider]  Insulin  Syringe-Needle U-100 31G X 5/16 1 ML MISC inject 10 units into the skin 3 times daily. use to inject novolog  based on insulin  to card ratio- max daily dose 150 units 09/23/20  Yes   lisinopril  (ZESTRIL ) 20 MG tablet Take 1 tablet (20 mg total) by mouth daily. 03/07/23  Yes   olopatadine  (PATANOL) 0.1 % ophthalmic solution Place 1 drop into both eyes 2 (two) times daily. 03/22/23  Yes Blitch, Marval HERO, NP  OZEMPIC , 0.25 OR 0.5  MG/DOSE, 2 MG/3ML SOPN Inject into the skin. 06/13/22  Yes [provider]  pantoprazole  (PROTONIX ) 40 MG tablet Take 1 tablet (40 mg total) by mouth daily. 10/23/22  Yes Newlin, Enobong, MD  triamcinolone  (NASACORT ) 55 MCG/ACT AERO nasal inhaler Place 2 sprays into the nose daily. 03/22/23  Yes Blitch, Marval HERO, NP  insulin  aspart (NOVOLOG ) 100 UNIT/ML injection USE VIA INSULIN  PUMP. MAX TOTAL DAILY DOSE OF 300 UNITS 03/07/23     Insulin  Glargine Solostar 300 UNIT/ML SOPN Inject 70 Units into the skin daily. Only  if insullin pump malfunction. 11/27/22     triamcinolone  cream (KENALOG ) 0.1 % Apply 1 Application topically 2 (two) times daily. To affected area till better 04/04/23   Vonna Sharlet POUR, MD  ARIPiprazole  (ABILIFY ) 5 MG tablet Take 1 tablet (5 mg total) by mouth daily. 10/27/19 06/13/20  Sater, Charlie LABOR, MD    Family History Family History  Problem Relation Age of Onset   Asthma Mother    Kidney failure Mother    Brain cancer Mother    Asthma Father    Other Father        surgery on stomach but don't know from what   Breast cancer Maternal Grandmother    Diabetes Maternal Grandmother    Diabetes Brother     Social History Social History   Tobacco Use   Smoking status: Former    Current packs/day: 0.00    Average packs/day: 0.1 packs/day for 2.0 years (0.2 ttl pk-yrs)    Types: Cigarettes, E-cigarettes    Start date: 11/28/2015    Quit date: 11/27/2017    Years since quitting: 5.5   Smokeless tobacco: Never   Tobacco comments:    I vape every now and then.  Vaping Use   Vaping status: Former   Devices: Elfvar  (05-04-2021 per pt last vaped approx 2 wks ago)  Substance Use Topics   Alcohol use: No   Drug use: Never     Allergies   Morphine  and codeine , Glyburide , Hydrocodone , Ivp dye [iodinated contrast media], Mupirocin , and Oxycodone    Review of Systems Review of Systems  Neurological:  Positive for headaches.     Physical Exam Triage Vital  Signs ED Triage Vitals  Encounter Vitals Group     BP 06/10/23 1905 (!) 138/101     Systolic BP Percentile --      Diastolic BP Percentile --      Pulse Rate 06/10/23 1905 (!) 105     Resp 06/10/23 1905 20     Temp 06/10/23 1905 98.3 F (36.8 C)     Temp src --      SpO2 06/10/23 1905 93 %     Weight --      Height --      Head Circumference --      Peak Flow --      Pain Score 06/10/23 1902 8     Pain Loc --      Pain Education --      Exclude from Growth Chart --    No data found.  Updated Vital Signs BP (!) 138/101   Pulse (!) 105   Temp 98.3 F (36.8 C)   Resp 20   LMP  (LMP Unknown) Comment: NO period with Depo shot  SpO2 93%   Visual Acuity Right Eye Distance:   Left Eye Distance:   Bilateral Distance:    Right Eye Near:   Left Eye Near:    Bilateral Near:     Physical Exam Vitals reviewed.  Constitutional:      General: She is not in acute distress.    Appearance: She is not ill-appearing, toxic-appearing or diaphoretic.  Eyes:     Extraocular Movements: Extraocular movements intact.     Conjunctiva/sclera: Conjunctivae normal.     Pupils: Pupils are equal, round, and reactive to light.  Cardiovascular:     Rate and Rhythm: Normal rate and regular rhythm.     Heart sounds: No murmur heard. Pulmonary:     Effort: Pulmonary  effort is normal.     Breath sounds: Normal breath sounds.  Musculoskeletal:     Cervical back: Neck supple.  Lymphadenopathy:     Cervical: No cervical adenopathy.  Skin:    Coloration: Skin is not jaundiced or pale.  Neurological:     General: No focal deficit present.     Mental Status: She is alert and oriented to person, place, and time.  Psychiatric:        Behavior: Behavior normal.      UC Treatments / Results  Labs (all labs ordered are listed, but only abnormal results are displayed) Labs Reviewed - No data to display  EKG   Radiology No results found.  Procedures Procedures (including critical care  time)  Medications Ordered in UC Medications  ketorolac  (TORADOL ) 30 MG/ML injection 30 mg (30 mg Intramuscular Given 06/10/23 1926)  SUMAtriptan  (IMITREX ) injection 6 mg (6 mg Subcutaneous Given 06/10/23 1925)  ondansetron  (ZOFRAN -ODT) disintegrating tablet 4 mg (4 mg Oral Given 06/10/23 1926)    Initial Impression / Assessment and Plan / UC Course  I have reviewed the triage vital signs and the nursing notes.  Pertinent labs & imaging results that were available during my care of the patient were reviewed by me and considered in my medical decision making (see chart for details).   Sumatriptan  and Toradol  are given an injection here.  Also 1 dose of Zofran  ODT is administered here.  Wrist braces are supplied bilaterally. Final Clinical Impressions(s) / UC Diagnoses   Final diagnoses:  Intractable periodic headache syndrome  Bilateral carpal tunnel syndrome     Discharge Instructions      You have been given a shot of Toradol  30 mg and of sumatriptan  6 mg today.  We have also given you 1 dose of ondansetron  for the nausea.      ED Prescriptions   None    PDMP not reviewed this encounter.   Vonna Sharlet POUR, MD 06/10/23 [phone]

## 2023-06-10 NOTE — ED Triage Notes (Signed)
 Pt reports she has had a HA since Friday. Pt reports nausea and unable to eat. Pt also has light sensitivity.

## 2023-06-11 ENCOUNTER — Ambulatory Visit (HOSPITAL_COMMUNITY): Payer: Medicaid Other

## 2023-06-13 ENCOUNTER — Encounter: Payer: Self-pay | Admitting: Podiatry

## 2023-06-13 ENCOUNTER — Ambulatory Visit (INDEPENDENT_AMBULATORY_CARE_PROVIDER_SITE_OTHER): Payer: Medicaid Other

## 2023-06-13 ENCOUNTER — Ambulatory Visit: Payer: Medicaid Other | Admitting: Podiatry

## 2023-06-13 DIAGNOSIS — M778 Other enthesopathies, not elsewhere classified: Secondary | ICD-10-CM | POA: Diagnosis not present

## 2023-06-13 DIAGNOSIS — L600 Ingrowing nail: Secondary | ICD-10-CM

## 2023-06-13 MED ORDER — CICLOPIROX 8 % EX SOLN: Freq: Every day | CUTANEOUS | 2 refills | Status: DC

## 2023-06-13 NOTE — Patient Instructions (Signed)
 Ciclopirox  Topical Solution What is this medication? CICLOPIROX  (sye kloe PEER ox) treats fungal infections of the nails. It belongs to a group of medications called antifungals. It will not treat infections caused by bacteria or viruses. This medicine may be used for other purposes; ask your health care provider or pharmacist if you have questions. COMMON BRAND NAME(S): Ciclodan  Nail Solution, CNL8, Penlac  What should I tell my care team before I take this medication? They need to know if you have any of these conditions: Diabetes (high blood sugar) Immune system problems Organ transplant Receiving steroid inhalers, cream, or lotion Seizures Tingling of the fingers or toes or other nerve disorder An unusual or allergic reaction to ciclopirox , other medications, foods, dyes, or preservatives Pregnant or trying to get pregnant Breast-feeding How should I use this medication? This medication is for external use only. Do not take by mouth. Wash your hands before and after use. If you are treating your hands, only wash your hands before use. Do not get it in your eyes. If you do, rinse your eyes with plenty of cool tap water . Use it as directed on the prescription label at the same time every day. Do not use it more often than directed. Use the medication for the full course as directed by your care team, even if you think you are better. Do not stop using it unless your care team tells you to stop it early. Apply a thin film of the medication to the affected area. Talk to your care team about the use of this medication in children. While it may be prescribed for children as young as 12 years for selected conditions, precautions do apply. Overdosage: If you think you have taken too much of this medicine contact a poison control center or emergency room at once. NOTE: This medicine is only for you. Do not share this medicine with others. What if I miss a dose? If you miss a dose, use it as soon as  you can. If it is almost time for your next dose, use only that dose. Do not use double or extra doses. What may interact with this medication? Interactions are not expected. Do not use any other skin products without telling your care team. This list may not describe all possible interactions. Give your health care provider a list of all the medicines, herbs, non-prescription drugs, or dietary supplements you use. Also tell them if you smoke, drink alcohol, or use illegal drugs. Some items may interact with your medicine. What should I watch for while using this medication? Visit your care team for regular checks on your progress. It may be some time before you see the benefit from this medication. Do not use nail polish or other nail cosmetic products on the treated nails. Removal of the unattached, infected nail by your care team is needed with use of this medication. If you have diabetes or numbness in your fingers or toes, talk to your care team about proper nail care. What side effects may I notice from receiving this medication? Side effects that you should report to your care team as soon as possible: Allergic reactions--skin rash, itching, hives, swelling of the face, lips, tongue, or throat Burning, itching, crusting, or peeling of treated skin Side effects that usually do not require medical attention (report to your care team if they continue or are bothersome): Change in nail shape, thickness, or color Mild skin irritation, redness, or dryness This list may not describe all possible side  effects. Call your doctor for medical advice about side effects. You may report side effects to FDA at 1-800-FDA-1088. Where should I keep my medication? Keep out of the reach of children and pets. Store at room temperature between 20 and 25 degrees C (68 and 77 degrees F). This medication is flammable. Avoid exposure to heat, fire, flame, and smoking. Get rid of medications that are no longer needed  or have expired: Take the medication to a medication take-back program. Check with your pharmacy or law enforcement to find a location. If you cannot return the medication, check the label or package insert to see if the medication should be thrown out in the garbage or flushed down the toilet. If you are not sure, ask your care team. If it is safe to put in the trash, take the medication out of the container. Mix the medication with cat litter, dirt, coffee grounds, or other unwanted substance. Seal the mixture in a bag or container. Put it in the trash. NOTE: This sheet is a summary. It may not cover all possible information. If you have questions about this medicine, talk to your doctor, pharmacist, or health care provider.  2024 Elsevier/Gold Standard (2021-09-18 00:00:00)   Ingrown Toenail  An ingrown toenail occurs when the corner or sides of a toenail grow into the surrounding skin. This causes discomfort and pain. The big toe is most commonly affected, but any of the toes can be affected. If an ingrown toenail is not treated, it can become infected. What are the causes? This condition may be caused by: Wearing shoes that are too small or tight. An injury, such as stubbing your toe or having your toe stepped on. Improper cutting or care of your toenails. Having nail or foot abnormalities that were present from birth (congenital abnormalities), such as having a nail that is too big for your toe. What increases the risk? The following factors may make you more likely to develop ingrown toenails: Age. Nails tend to get thicker with age, so ingrown nails are more common among older people. Cutting your toenails incorrectly, such as cutting them very short or cutting them unevenly. An ingrown toenail is more likely to get infected if you have: Diabetes. Blood flow (circulation) problems. What are the signs or symptoms? Symptoms of an ingrown toenail may include: Pain, soreness, or  tenderness. Redness. Swelling. Hardening of the skin that surrounds the toenail. Signs that an ingrown toenail may be infected include: Fluid or pus. Symptoms that get worse. How is this diagnosed? Ingrown toenails may be diagnosed based on: Your symptoms and medical history. A physical exam. Labs or tests. If you have fluid or blood coming from your toenail, a sample may be collected to test for the specific type of bacteria that is causing the infection. How is this treated? Treatment depends on the severity of your symptoms. You may be able to care for your toenail at home. If you have an infection, you may be prescribed antibiotic medicines. If you have fluid or pus draining from your toenail, your health care provider may drain it. If you have trouble walking, you may be given crutches to use. If you have a severe or infected ingrown toenail, you may need a procedure to remove part or all of the nail. Follow these instructions at home: Foot care  Check your wound every day for signs of infection, or as often as told by your health care provider. Check for: More redness, swelling, or pain.  More fluid or blood. Warmth. Pus or a bad smell. Do not pick at your toenail or try to remove it yourself. Soak your foot in warm, soapy water . Do this for 20 minutes, 3 times a day, or as often as told by your health care provider. This helps to keep your toe clean and your skin soft. Wear shoes that fit well and are not too tight. Your health care provider may recommend that you wear open-toed shoes while you heal. Trim your toenails regularly and carefully. Cut your toenails straight across to prevent injury to the skin at the corners of the toenail. Do not cut your nails in a curved shape. Keep your feet clean and dry to help prevent infection. General instructions Take over-the-counter and prescription medicines only as told by your health care provider. If you were prescribed an  antibiotic, take it as told by your health care provider. Do not stop taking the antibiotic even if you start to feel better. If your health care provider told you to use crutches to help you move around, use them as instructed. Return to your normal activities as told by your health care provider. Ask your health care provider what activities are safe for you. Keep all follow-up visits. This is important. Contact a health care provider if: You have more redness, swelling, pain, or other symptoms that do not improve with treatment. You have fluid, blood, or pus coming from your toenail. You have a red streak on your skin that starts at your foot and spreads up your leg. You have a fever. Summary An ingrown toenail occurs when the corner or sides of a toenail grow into the surrounding skin. This causes discomfort and pain. The big toe is most commonly affected, but any of the toes can be affected. If an ingrown toenail is not treated, it can become infected. Fluid or pus draining from your toenail is a sign of infection. Your health care provider may need to drain it. You may be given antibiotics to treat the infection. Trimming your toenails regularly and properly can help you prevent an ingrown toenail. This information is not intended to replace advice given to you by your health care provider. Make sure you discuss any questions you have with your health care provider. Document Revised: 09/20/2020 Document Reviewed: 09/20/2020 Elsevier Patient Education  2024 [employer].

## 2023-06-13 NOTE — Progress Notes (Signed)
 Subjective: Chief Complaint  Patient presents with   Toe Pain    Rm#11 Left toe pain previous procedure and still hurting.Patient thinks has ingrown nail on right big toe.   43 year old female presents the office with above concerns.  She said that she previously had a procedure of the left big toe and she did well however the nail has become loose and gets tender at times and she has noticed some ridging of the toenail.  No drainage or pus.  She also gets tenderness on the right medial hallux nail border.  No drainage or pus.  She does not want to proceed with any further procedure at this time.  Objective: AAO x3, NAD DP/PT pulses palpable bilaterally, CRT less than 3 seconds On the left hallux toenail the nail looks like a new toenail is growing and is clear in nature on the proximal third.  The distal two thirds of the nail is dystrophic with transverse ridging present with mild brown discoloration.  There is no edema or erythema or signs of infection.  Is noted to pus.  On the right hallux toenail, medial nail border mild at the base of the nail mostly distally with tenderness palpation.  Is also incurvation along the lateral aspect the right hallux toenail but no pain today.  No edema, erythema or signs of infection. The nails are very hypertrophic and dystrophic with subungual debris present. No pain with calf compression, swelling, warmth, erythema  Assessment: 43 year old female with ingrown toenail right hallux, onychodystrophy left hallux  Plan: -All treatment options discussed with the patient including all alternatives, risks, complications.  -X-rays obtained of the left foot given the pain.  There is no spurring present of the hallux. -We discussed partial nail avulsion however she was applauded on this today.  Sharply debride the nail without any complications within the right side.  If symptoms persist or states signs of infection consider partial nail avulsion left hallux  toenail to continue to grow out.  I do think she would get some fungus in the nails given the brittleness, dystrophy.  Prescribed Penlac . -Patient encouraged to call the office with any questions, concerns, change in symptoms.     Left big toe loose and transverse rigiding Right medial tried epsom salts

## 2023-06-19 ENCOUNTER — Other Ambulatory Visit (HOSPITAL_COMMUNITY): Payer: Self-pay

## 2023-06-19 ENCOUNTER — Other Ambulatory Visit: Payer: Self-pay

## 2023-06-20 ENCOUNTER — Other Ambulatory Visit: Payer: Self-pay

## 2023-06-21 ENCOUNTER — Other Ambulatory Visit: Payer: Self-pay

## 2023-06-25 ENCOUNTER — Other Ambulatory Visit: Payer: Self-pay

## 2023-07-05 ENCOUNTER — Other Ambulatory Visit: Payer: Self-pay

## 2023-07-08 ENCOUNTER — Ambulatory Visit: Payer: Medicaid Other | Admitting: Podiatry

## 2023-07-19 ENCOUNTER — Other Ambulatory Visit: Payer: Self-pay

## 2023-07-24 ENCOUNTER — Ambulatory Visit: Payer: Medicaid Other | Admitting: Podiatry

## 2023-07-31 ENCOUNTER — Telehealth: Payer: Self-pay | Admitting: Podiatry

## 2023-07-31 NOTE — Telephone Encounter (Signed)
 Pt requests a handicap application as her last one issued has expired.

## 2023-08-02 ENCOUNTER — Ambulatory Visit (INDEPENDENT_AMBULATORY_CARE_PROVIDER_SITE_OTHER): Payer: Medicaid Other | Admitting: Podiatry

## 2023-08-02 DIAGNOSIS — L600 Ingrowing nail: Secondary | ICD-10-CM | POA: Diagnosis not present

## 2023-08-02 NOTE — Progress Notes (Signed)
 Subjective:  Patient ID: Sarah Garrison, female    DOB: [birth year],  MRN: [id number]  Chief Complaint  Patient presents with   Nail Problem    Pt stated that she wanted to have her nails looked at to make sure it wasn't ingrown    43 y.o. female presents with the above complaint.  Patient presents with right hallux nail dystrophy/ingrown nail.  She wanted to get it evaluated she does not want anything taken out just wants to make sure that it is not infected denies any other acute complaints   Review of Systems: Negative except as noted in the HPI. Denies N/V/F/Ch.  Past Medical History:  Diagnosis Date   Anxiety    Asthma    Chronic constipation    Chronic shortness of breath    Depression    GERD (gastroesophageal reflux disease)    History of COVID-19 09/29/2019   09-28-2020 result in epic; hospital admission 10-05-2019 for covid pneumonia with hypoxic acute respiratory failure, no oxygen needed when discharged 10-06-2019;  and positive 06-13-2020 result in epic, pt stated no symptoms   History of seizures    (05-04-2021  pt stated was told during hospital admission 05/ 2021 with covid , she had 2 seizures,  stated no medication and no seizure since)   Hypertension    Insulin dependent type 2 diabetes mellitus [facility])    endocrinologist-- Fredia Sorrow NP;   pt uses insulin pump   Insulin pump in place    Mixed hyperlipidemia    OSA (obstructive sleep apnea)    ( 05-04-2021 pt stated has not used cpap since 05/ 2021) followed by dr byrum;  study in epic 02-16-2019 moderate , titrated cpap 08-28-2019   Plantar fasciitis, right    w/ chronic pain    Current Outpatient Medications:    albuterol (VENTOLIN HFA) 108 (90 Base) MCG/ACT inhaler, Inhale 2 puffs into the lungs every 4 (four) hours as needed for wheezing or shortness of breath., Disp: 18 g, Rfl: 0   atorvastatin (LIPITOR) 40 MG tablet, Take 1 tablet (40 mg total) by mouth daily., Disp: 90 tablet, Rfl: 1   ciclopirox  (PENLAC) 8 % solution, Apply topically at bedtime. Apply over nail and surrounding skin. Apply daily over previous coat. After seven (7) days, may remove with alcohol and continue cycle., Disp: 6.6 mL, Rfl: 2   Continuous Blood Gluc Sensor (DEXCOM G6 SENSOR) MISC, USE AND CHANGE SENSOR EVERY 10 DAYS AS DIRECTED, Disp: , Rfl:    Continuous Blood Gluc Sensor (DEXCOM G6 SENSOR) MISC, SMARTSIG:Topical Every 10 Days, Disp: , Rfl:    Continuous Blood Gluc Transmit (DEXCOM G6 TRANSMITTER) MISC, USE TO CHECK BLOOD SUGAR EVERY DAY. CHANGE EVERY 3 MONTHS AS DIRECTED, Disp: , Rfl:    famotidine (PEPCID) 20 MG tablet, Take 1 tablet (20 mg total) by mouth daily., Disp: 30 tablet, Rfl: 0   fexofenadine (ALLEGRA) 60 MG tablet, Take 1 tablet (60 mg total) by mouth 2 (two) times daily., Disp: 60 tablet, Rfl: 0   insulin aspart (NOVOLOG FLEXPEN) 100 UNIT/ML FlexPen, Inject into the skin 3 (three) times daily before meals. ONLY WHEN INSULIN PUMP MALFUNCTION'S PER ENDOCRINOLOGY DIRECTION, Disp: , Rfl:    insulin aspart (NOVOLOG) 100 UNIT/ML injection, USE VIA INSULIN PUMP. MAX TOTAL DAILY DOSE OF 200 UNITS, Disp: 90 mL, Rfl: 5   insulin aspart (NOVOLOG) 100 UNIT/ML injection, USE VIA INSULIN PUMP. MAX TOTAL DAILY DOSE OF 250 UNITS, Disp: 100 mL, Rfl: 5   insulin  aspart (NOVOLOG) 100 UNIT/ML injection, USE VIA INSULIN PUMP. MAX TOTAL DAILY DOSE OF 300 UNITS, Disp: 90 mL, Rfl: 5   Insulin Glargine Solostar 300 UNIT/ML SOPN, Inject 70 Units into the skin daily. Only if insullin pump malfunction., Disp: 9 mL, Rfl: 1   Insulin Glargine Solostar 300 UNIT/ML SOPN, Inject 70 Units as directed daily., Disp: 9 mL, Rfl: 1   Insulin Human (INSULIN PUMP) SOLN, Inject 1 each into the skin See admin instructions. Medication: Novolog 100 units/ml injection. Takes 100 units via pump per patient., Disp: , Rfl:    Insulin Syringe-Needle U-100 31G X 5/16" 1 ML MISC, inject 10 units into the skin 3 times daily. use to inject novolog based on  insulin to card ratio- max daily dose 150 units, Disp: 100 each, Rfl: 2   lisinopril (ZESTRIL) 20 MG tablet, Take 1 tablet (20 mg total) by mouth daily., Disp: 90 tablet, Rfl: 2   olopatadine (PATANOL) 0.1 % ophthalmic solution, Place 1 drop into both eyes 2 (two) times daily., Disp: 5 mL, Rfl: 0   OZEMPIC, 0.25 OR 0.5 MG/DOSE, 2 MG/3ML SOPN, Inject into the skin., Disp: , Rfl:    pantoprazole (PROTONIX) 40 MG tablet, Take 1 tablet (40 mg total) by mouth daily., Disp: 30 tablet, Rfl: 3   triamcinolone (NASACORT) 55 MCG/ACT AERO nasal inhaler, Place 2 sprays into the nose daily., Disp: 1 each, Rfl: 12   triamcinolone cream (KENALOG) 0.1 %, Apply 1 Application topically 2 (two) times daily. To affected area till better, Disp: 80 g, Rfl: 0  Social History   Tobacco Use  Smoking Status Former   Current packs/day: 0.00   Average packs/day: 0.1 packs/day for 2.0 years (0.2 ttl pk-yrs)   Types: Cigarettes, E-cigarettes   Start date: 11/28/2015   Quit date: 11/27/2017   Years since quitting: 5.6  Smokeless Tobacco Never  Tobacco Comments   I vape every now and then.    Allergies  Allergen Reactions   Morphine And Codeine Shortness Of Breath   Glyburide Diarrhea and Other (See Comments)    Reaction:  Nose bleeds    Hydrocodone Other (See Comments)    Pt states that this medication makes her "dream about rabbits chasing her"   Ivp Dye [Iodinated Contrast Media] Other (See Comments)    Shortness of breath.     Mupirocin Diarrhea   Oxycodone Other (See Comments)    Pt states that this medication makes her "dream about rabbits chasing" her.     Objective:  There were no vitals filed for this visit. There is no height or weight on file to calculate BMI. Constitutional Well developed. Well nourished.  Vascular Dorsalis pedis pulses palpable bilaterally. Posterior tibial pulses palpable bilaterally. Capillary refill normal to all digits.  No cyanosis or clubbing noted. Pedal hair growth  normal.  Neurologic Normal speech. Oriented to person, place, and time. Epicritic sensation to light touch grossly present bilaterally.  Dermatologic Nails right hallux lateral border ingrown mild pain to palpation no infection noted no purulent drainage noted Skin within normal limits  Orthopedic: Normal joint ROM without pain or crepitus bilaterally. No visible deformities. No bony tenderness.   Radiographs: None Assessment:  No diagnosis found. Plan:  Patient was evaluated and treated and all questions answered.  Right hallux lateral border ingrown -All questions and concerns were discussed with the patient extensive detail -Given the amount of pain that she is experiencing she will benefit from removal in the future if it however patient  is ready she states understanding will come back if it continues to bother  No follow-ups on file.

## 2023-08-06 ENCOUNTER — Other Ambulatory Visit: Payer: Self-pay

## 2023-08-06 MED ORDER — INSULIN ASPART 100 UNIT/ML IJ SOLN
INTRAMUSCULAR | 5 refills | Status: AC
  Filled 2023-08-06: qty 90, 30d supply, fill #0
  Filled 2023-09-13 (×3): qty 90, 30d supply, fill #1
  Filled 2023-11-25: qty 90, 30d supply, fill #2
  Filled 2023-12-26: qty 90, 30d supply, fill #3
  Filled 2024-01-23 – 2024-02-14 (×3): qty 90, 30d supply, fill #4
  Filled 2024-03-16 (×2): qty 90, 30d supply, fill #5

## 2023-08-08 ENCOUNTER — Ambulatory Visit: Payer: Medicaid Other | Attending: Family Medicine

## 2023-08-08 ENCOUNTER — Other Ambulatory Visit: Payer: Self-pay

## 2023-08-08 ENCOUNTER — Telehealth: Payer: Self-pay

## 2023-08-08 ENCOUNTER — Ambulatory Visit (HOSPITAL_BASED_OUTPATIENT_CLINIC_OR_DEPARTMENT_OTHER): Admitting: Family Medicine

## 2023-08-08 ENCOUNTER — Ambulatory Visit: Admitting: Physician Assistant

## 2023-08-08 VITALS — BP 133/86 | HR 99 | Ht 64.0 in | Wt 248.6 lb

## 2023-08-08 DIAGNOSIS — Z3042 Encounter for surveillance of injectable contraceptive: Secondary | ICD-10-CM

## 2023-08-08 DIAGNOSIS — J302 Other seasonal allergic rhinitis: Secondary | ICD-10-CM

## 2023-08-08 MED ORDER — OLOPATADINE HCL 0.1 % OP SOLN
1.0000 [drp] | Freq: Two times a day (BID) | OPHTHALMIC | 2 refills | Status: AC
  Filled 2023-08-08: qty 5, 34d supply, fill #0

## 2023-08-08 MED ORDER — MEDROXYPROGESTERONE ACETATE 150 MG/ML IM SUSP
150.0000 mg | Freq: Once | INTRAMUSCULAR | Status: AC
  Administered 2023-08-08: 150 mg via INTRAMUSCULAR

## 2023-08-08 MED ORDER — CETIRIZINE HCL 10 MG PO TABS
10.0000 mg | ORAL_TABLET | Freq: Every day | ORAL | 1 refills | Status: DC
  Filled 2023-08-08: qty 30, 30d supply, fill #0

## 2023-08-08 NOTE — Progress Notes (Signed)
 Subjective:  Patient ID: Sarah Garrison, female    DOB: 10/21/1980  Age: 43 y.o. MRN: [id number]  CC: Allergies     Discussed the use of AI scribe software for clinical note transcription with the patient, who gave verbal consent to proceed.  History of Present Illness The patient, with a history of glaucoma and allergies, presents with itchy eyes, nose, throat, and ears. She attributes these symptoms to allergies. The symptoms started yesterday and were so severe that she was unable to administer her glaucoma eye drops. She has been taking Zyrtec inconsistently for her allergies but ran out of the medication. She also uses Flonase for nasal symptoms.    Past Medical History:  Diagnosis Date   Anxiety    Asthma    Chronic constipation    Chronic shortness of breath    Depression    GERD (gastroesophageal reflux disease)    History of COVID-19 09/29/2019   09-28-2020 result in epic; hospital admission 10-05-2019 for covid pneumonia with hypoxic acute respiratory failure, no oxygen needed when discharged 10-06-2019;  and positive 06-13-2020 result in epic, pt stated no symptoms   History of seizures    (05-04-2021  pt stated was told during hospital admission 05/ 2021 with covid , she had 2 seizures,  stated no medication and no seizure since)   Hypertension    Insulin dependent type 2 diabetes mellitus [facility])    endocrinologist-- Fredia Sorrow NP;   pt uses insulin pump   Insulin pump in place    Mixed hyperlipidemia    OSA (obstructive sleep apnea)    ( 05-04-2021 pt stated has not used cpap since 05/ 2021) followed by dr byrum;  study in epic 02-16-2019 moderate , titrated cpap 08-28-2019   Plantar fasciitis, right    w/ chronic pain    Past Surgical History:  Procedure Laterality Date   CESAREAN SECTION  1999   PLANTAR FASCIA RELEASE Right 05/08/2021   Procedure: ENDOSCOPIC PLANTAR FASCIOTOMY;  Surgeon: Asencion Islam, DPM;  Location: [facility];   Service: Podiatry;  Laterality: Right;  WITH BLOCK   STERIOD INJECTION Bilateral 05/08/2021   Procedure: PLATELET RICH PLASMA INJECTION HEELS;  Surgeon: Asencion Islam, DPM;  Location: [facility];  Service: Podiatry;  Laterality: Bilateral;   TUBAL LIGATION Bilateral 09/29/2006   @[facility] ;   [facility]   VENTRAL HERNIA REPAIR N/A 11/27/2017   Procedure: VENTRAL HERNIA REPAIR ERAS PATHWAY;  Surgeon: Harriette Bouillon, MD;  Location: [facility];  Service: General;  Laterality: N/A;   WISDOM TOOTH EXTRACTION      Family History  Problem Relation Age of Onset   Asthma Mother    Kidney failure Mother    Brain cancer Mother    Asthma Father    Other Father        surgery on stomach but don't know from what   Breast cancer Maternal Grandmother    Diabetes Maternal Grandmother    Diabetes Brother     Social History   Socioeconomic History   Marital status: Single    Spouse name: Not on file   Number of children: 4   Years of education: Not on file   Highest education level: Not on file  Occupational History   Not on file  Tobacco Use   Smoking status: Former    Current packs/day: 0.00    Average packs/day: 0.1 packs/day for 2.0 years (0.2 ttl pk-yrs)    Types: Cigarettes, E-cigarettes  Start date: 11/28/2015    Quit date: 11/27/2017    Years since quitting: 5.6   Smokeless tobacco: Never   Tobacco comments:    I vape every now and then.  Vaping Use   Vaping status: Former   Devices: Elfvar  (05-04-2021 per pt last vaped approx 2 wks ago)  Substance and Sexual Activity   Alcohol use: No   Drug use: Never   Sexual activity: Yes    Birth control/protection: Surgical, Injection  Other Topics Concern   Not on file  Social History Narrative   Right handed   No caffeine use   Diet coke sometimes    Social Drivers of Corporate investment banker Strain: Not on file  Food Insecurity: Low Risk  (03/07/2023)   Received from [facility]   Hunger Vital  Sign    Worried About Running Out of Food in the Last Year: Never true    Ran Out of Food in the Last Year: Never true  Transportation Needs: No Transportation Needs (03/07/2023)   Received from [facility]    In the past 12 months, has lack of reliable transportation kept you from medical appointments, meetings, work or from getting things needed for daily living? : No  Physical Activity: Not on file  Stress: Not on file  Social Connections: Not on file    Allergies  Allergen Reactions   Morphine And Codeine Shortness Of Breath   Glyburide Diarrhea and Other (See Comments)    Reaction:  Nose bleeds    Hydrocodone Other (See Comments)    Pt states that this medication makes her "dream about rabbits chasing her"   Ivp Dye [Iodinated Contrast Media] Other (See Comments)    Shortness of breath.     Mupirocin Diarrhea   Oxycodone Other (See Comments)    Pt states that this medication makes her "dream about rabbits chasing" her.      Outpatient Medications Prior to Visit  Medication Sig Dispense Refill   albuterol (VENTOLIN HFA) 108 (90 Base) MCG/ACT inhaler Inhale 2 puffs into the lungs every 4 (four) hours as needed for wheezing or shortness of breath. 18 g 0   atorvastatin (LIPITOR) 40 MG tablet Take 1 tablet (40 mg total) by mouth daily. 90 tablet 1   ciclopirox (PENLAC) 8 % solution Apply topically at bedtime. Apply over nail and surrounding skin. Apply daily over previous coat. After seven (7) days, may remove with alcohol and continue cycle. 6.6 mL 2   Continuous Blood Gluc Sensor (DEXCOM G6 SENSOR) MISC USE AND CHANGE SENSOR EVERY 10 DAYS AS DIRECTED     Continuous Blood Gluc Sensor (DEXCOM G6 SENSOR) MISC SMARTSIG:Topical Every 10 Days     Continuous Blood Gluc Transmit (DEXCOM G6 TRANSMITTER) MISC USE TO CHECK BLOOD SUGAR EVERY DAY. CHANGE EVERY 3 MONTHS AS DIRECTED     famotidine (PEPCID) 20 MG tablet Take 1 tablet (20 mg total) by mouth daily. 30 tablet 0    insulin aspart (NOVOLOG FLEXPEN) 100 UNIT/ML FlexPen Inject into the skin 3 (three) times daily before meals. ONLY WHEN INSULIN PUMP MALFUNCTION'S PER ENDOCRINOLOGY DIRECTION     insulin aspart (NOVOLOG) 100 UNIT/ML injection USE VIA INSULIN PUMP. MAX TOTAL DAILY DOSE OF 200 UNITS 90 mL 5   insulin aspart (NOVOLOG) 100 UNIT/ML injection USE VIA INSULIN PUMP. MAX TOTAL DAILY DOSE OF 250 UNITS 100 mL 5   insulin aspart (NOVOLOG) 100 UNIT/ML injection USE VIA INSULIN PUMP. MAX TOTAL  DAILY DOSE OF 300 UNITS 90 mL 5   Insulin Glargine Solostar 300 UNIT/ML SOPN Inject 70 Units into the skin daily. Only if insullin pump malfunction. 9 mL 1   Insulin Human (INSULIN PUMP) SOLN Inject 1 each into the skin See admin instructions. Medication: Novolog 100 units/ml injection. Takes 100 units via pump per patient.     Insulin Syringe-Needle U-100 31G X 5/16" 1 ML MISC inject 10 units into the skin 3 times daily. use to inject novolog based on insulin to card ratio- max daily dose 150 units 100 each 2   OZEMPIC, 0.25 OR 0.5 MG/DOSE, 2 MG/3ML SOPN Inject into the skin.     pantoprazole (PROTONIX) 40 MG tablet Take 1 tablet (40 mg total) by mouth daily. 30 tablet 3   triamcinolone (NASACORT) 55 MCG/ACT AERO nasal inhaler Place 2 sprays into the nose daily. 1 each 12   triamcinolone cream (KENALOG) 0.1 % Apply 1 Application topically 2 (two) times daily. To affected area till better 80 g 0   fexofenadine (ALLEGRA) 60 MG tablet Take 1 tablet (60 mg total) by mouth 2 (two) times daily. 60 tablet 0   olopatadine (PATANOL) 0.1 % ophthalmic solution Place 1 drop into both eyes 2 (two) times daily. 5 mL 0   No facility-administered medications prior to visit.     ROS Review of Systems  Constitutional:  Negative for activity change and appetite change.  HENT:  Negative for sinus pressure and sore throat.   Eyes:  Positive for itching.  Respiratory:  Negative for chest tightness, shortness of breath and wheezing.    Cardiovascular:  Negative for chest pain and palpitations.  Gastrointestinal:  Negative for abdominal distention, abdominal pain and constipation.  Genitourinary: Negative.   Musculoskeletal: Negative.   Psychiatric/Behavioral:  Negative for behavioral problems and dysphoric mood.     Objective:  BP 133/86   Pulse 99   Ht 5\' 4"  (1.626 m)   Wt 248 lb 9.6 oz (112.8 kg)   SpO2 98%   BMI 42.67 kg/m      08/08/2023   11:43 AM 08/08/2023   11:08 AM 06/10/2023    7:05 PM  BP/Weight  Systolic BP 133 130 138  Diastolic BP 86 91 101  Wt. (Lbs)  248.6   BMI  42.67 kg/m2       Physical Exam Constitutional:      Appearance: She is well-developed.  HENT:     Right Ear: Tympanic membrane normal.     Left Ear: Tympanic membrane normal.     Mouth/Throat:     Mouth: Mucous membranes are moist.  Cardiovascular:     Rate and Rhythm: Normal rate.     Heart sounds: Normal heart sounds. No murmur heard. Pulmonary:     Effort: Pulmonary effort is normal.     Breath sounds: Normal breath sounds. No wheezing or rales.  Chest:     Chest wall: No tenderness.  Abdominal:     General: Bowel sounds are normal. There is no distension.     Palpations: Abdomen is soft. There is no mass.     Tenderness: There is no abdominal tenderness.  Musculoskeletal:        General: Normal range of motion.     Right lower leg: No edema.     Left lower leg: No edema.  Neurological:     Mental Status: She is alert and oriented to person, place, and time.  Psychiatric:  Mood and Affect: Mood normal.        Latest Ref Rng & Units 11/01/2022   10:48 AM 05/31/2022    4:56 PM 05/27/2021    6:11 PM  CMP  Glucose 70 - 99 mg/dL 161  096  045   BUN 6 - 20 mg/dL 8  16  9    Creatinine 0.44 - 1.00 mg/dL 4.09  8.11  9.14   Sodium 135 - 145 mmol/L 138  141  137   Potassium 3.5 - 5.1 mmol/L 3.5  4.1  3.9   Chloride 98 - 111 mmol/L 108  106  108   CO2 22 - 32 mmol/L 23  23  21    Calcium 8.9 - 10.3 mg/dL 9.2   9.7  8.8   Total Protein 6.5 - 8.1 g/dL 6.9  6.9    Total Bilirubin 0.3 - 1.2 mg/dL 0.9  0.2    Alkaline Phos 38 - 126 U/L 75  112    AST 15 - 41 U/L 18  15    ALT 0 - 44 U/L 19  23      Lipid Panel     Component Value Date/Time   CHOL 244 (H) 10/26/2020 0908   TRIG 85 10/26/2020 0908   HDL 50 10/26/2020 0908   CHOLHDL 4.9 (H) 10/26/2020 0908   CHOLHDL 6 03/03/2018 1419   VLDL 37.4 03/03/2018 1419   LDLCALC 179 (H) 10/26/2020 0908    CBC    Component Value Date/Time   WBC 7.8 11/01/2022 1048   RBC 4.72 11/01/2022 1048   HGB 14.1 11/01/2022 1048   HGB 14.5 10/26/2020 0908   HCT 42.2 11/01/2022 1048   HCT 44.5 10/26/2020 0908   PLT 347 11/01/2022 1048   PLT 339 10/26/2020 0908   MCV 89.4 11/01/2022 1048   MCV 90 10/26/2020 0908   MCH 29.9 11/01/2022 1048   MCHC 33.4 11/01/2022 1048   RDW 12.2 11/01/2022 1048   RDW 12.4 10/26/2020 0908   LYMPHSABS 3.3 05/27/2021 1811   LYMPHSABS 3.1 10/26/2020 0908   MONOABS 0.5 05/27/2021 1811   EOSABS 0.1 05/27/2021 1811   EOSABS 0.0 10/26/2020 0908   BASOSABS 0.0 05/27/2021 1811   BASOSABS 0.0 10/26/2020 0908    Lab Results  Component Value Date   HGBA1C 12.2 (H) 10/07/2019       Assessment & Plan Allergic rhinitis with ocular involvement Symptoms consistent with allergic rhinitis exacerbated by spring onset. Managed with Zyrtec and Flonase. - Restart Zyrtec. - Add Patanol eye drops. - Advised on allergy management strategies including hand hygiene and avoiding facial contact.      Meds ordered this encounter  Medications   cetirizine (ZYRTEC) 10 MG tablet    Sig: Take 1 tablet (10 mg total) by mouth daily.    Dispense:  30 tablet    Refill:  1   olopatadine (PATANOL) 0.1 % ophthalmic solution    Sig: Place 1 drop into both eyes 2 (two) times daily.    Dispense:  5 mL    Refill:  2    Follow-up: Return for previously scheduled appointment.       Hoy Register, MD, FAAFP. [facility] [place], [place] [phone]   08/08/2023, 12:02 PM

## 2023-08-08 NOTE — Progress Notes (Signed)
 Pt was given depo injection in  right ventrogluteal. Pt tolerated shot well Urine HCG needed:NO Pt to return May22-jun5

## 2023-08-08 NOTE — Telephone Encounter (Signed)
 Patient in today for depo shot. Has c/o worsen allergy s/s. Patient voiced that she has tissues in her ears because they are hurting from the drainage, also her eyes are burning and itchy. Patient will be seen today as a walk-in. Patient is agreeable to wait to be seen.

## 2023-08-08 NOTE — Patient Instructions (Signed)
 VISIT SUMMARY:  You came in today with complaints of itchy eyes, nose, throat, and ears, which you believe are due to allergies. These symptoms started yesterday and were severe enough to prevent you from using your glaucoma eye drops. You mentioned that you have been taking Zyrtec inconsistently and have run out of the medication. You also use Flonase for your nasal symptoms.  YOUR PLAN:  -ALLERGIC RHINITIS WITH OCULAR INVOLVEMENT: Allergic rhinitis is an allergic reaction that causes sneezing, congestion, and itchy eyes, nose, throat, and ears. Your symptoms are likely worsened by the onset of spring. We recommend restarting Zyrtec and adding Patanol eye drops to help manage your symptoms. Additionally, we discussed allergy management strategies such as practicing good hand hygiene and avoiding touching your face.  INSTRUCTIONS:  Please restart taking Zyrtec as directed and begin using Patanol eye drops. Continue using Flonase for your nasal symptoms. Follow the allergy management strategies we discussed, including good hand hygiene and avoiding touching your face. If your symptoms do not improve or worsen, please schedule a follow-up appointment.  For more information, you can read your full clinical note, available in your patient portal.

## 2023-08-12 ENCOUNTER — Other Ambulatory Visit: Payer: Self-pay | Admitting: Family Medicine

## 2023-08-12 ENCOUNTER — Other Ambulatory Visit: Payer: Self-pay

## 2023-08-12 MED ORDER — AUM MINI INSULIN PEN NEEDLE 32G X 6 MM MISC
1.0000 [IU] | Freq: Every day | 5 refills | Status: DC
  Filled 2023-08-12: qty 100, 34d supply, fill #0

## 2023-08-12 MED ORDER — ATORVASTATIN CALCIUM 40 MG PO TABS
40.0000 mg | ORAL_TABLET | Freq: Every day | ORAL | 2 refills | Status: AC
  Filled 2023-08-12: qty 90, 90d supply, fill #0

## 2023-08-12 MED ORDER — OZEMPIC (1 MG/DOSE) 4 MG/3ML ~~LOC~~ SOPN
1.0000 mg | PEN_INJECTOR | SUBCUTANEOUS | 8 refills | Status: DC
  Filled 2023-08-12 – 2023-09-13 (×2): qty 3, 28d supply, fill #0

## 2023-08-13 ENCOUNTER — Other Ambulatory Visit: Payer: Self-pay

## 2023-08-15 ENCOUNTER — Ambulatory Visit: Payer: Medicaid Other | Admitting: Podiatry

## 2023-08-23 ENCOUNTER — Other Ambulatory Visit: Payer: Self-pay

## 2023-08-26 ENCOUNTER — Other Ambulatory Visit: Payer: Self-pay

## 2023-09-09 ENCOUNTER — Encounter (HOSPITAL_COMMUNITY): Payer: Self-pay

## 2023-09-09 ENCOUNTER — Ambulatory Visit (HOSPITAL_COMMUNITY)
Admission: RE | Admit: 2023-09-09 | Discharge: 2023-09-09 | Disposition: A | Discharge status: Home / Self Care | Source: Ambulatory Visit

## 2023-09-09 VITALS — BP 127/87 | HR 105 | Temp 98.2°F | Resp 16

## 2023-09-09 DIAGNOSIS — S61431A Puncture wound without foreign body of right hand, initial encounter: Secondary | ICD-10-CM

## 2023-09-09 MED ORDER — IBUPROFEN 800 MG PO TABS: 800.0000 mg | ORAL_TABLET | Freq: Three times a day (TID) | ORAL | 0 refills | Status: DC

## 2023-09-09 MED ORDER — CEPHALEXIN 500 MG PO CAPS: 500.0000 mg | ORAL_CAPSULE | Freq: Two times a day (BID) | ORAL | 0 refills | Status: AC

## 2023-09-09 NOTE — Discharge Instructions (Addendum)
 1. Puncture wound of right hand without foreign body, initial encounter (Primary) - cephALEXin (KEFLEX) 500 MG capsule; Take 1 capsule (500 mg total) by mouth 2 (two) times daily for 7 days.  Dispense: 14 capsule; Refill: 0 - ibuprofen (ADVIL) 800 MG tablet; Take 1 tablet (800 mg total) by mouth 3 (three) times daily.  Dispense: 21 tablet; Refill: 0 -Continue to monitor for signs and symptoms of severe infection such as increased redness, increased warmth, purulent drainage, increased swelling, and fever.

## 2023-09-09 NOTE — ED Provider Notes (Signed)
 [facility]  Note:  This document was prepared using Dragon voice recognition software and may include unintentional dictation errors.  MRN: [id number] DOB: [birth year]  Subjective:   Sarah Garrison is a 43 y.o. female presenting for puncture wound to the dorsum of the right hand that occurred yesterday.  Patient reports that she hit her right hand on a screw while working around the house.  Patient reports pain and inflammation to the back of the hand.  Denies fever, warmth, redness, drainage, significant swelling.  Patient reports that she is diabetic and is concern for infection.  Most recent tetanus vaccination was 1 year ago.  No current facility-administered medications for this encounter.  Current Outpatient Medications:    cephALEXin (KEFLEX) 500 MG capsule, Take 1 capsule (500 mg total) by mouth 2 (two) times daily for 7 days., Disp: 14 capsule, Rfl: 0   ibuprofen (ADVIL) 800 MG tablet, Take 1 tablet (800 mg total) by mouth 3 (three) times daily., Disp: 21 tablet, Rfl: 0   lisinopril (ZESTRIL) 20 MG tablet, Take 1 tablet by mouth daily., Disp: , Rfl:    SERTRALINE HCL PO, Take by mouth., Disp: , Rfl:    albuterol (VENTOLIN HFA) 108 (90 Base) MCG/ACT inhaler, Inhale 2 puffs into the lungs every 4 (four) hours as needed for wheezing or shortness of breath., Disp: 18 g, Rfl: 0   atorvastatin (LIPITOR) 40 MG tablet, Take 1 tablet (40 mg total) by mouth daily., Disp: 90 tablet, Rfl: 2   cetirizine (ZYRTEC) 10 MG tablet, Take 1 tablet (10 mg total) by mouth daily., Disp: 30 tablet, Rfl: 1   Continuous Blood Gluc Sensor (DEXCOM G6 SENSOR) MISC, USE AND CHANGE SENSOR EVERY 10 DAYS AS DIRECTED, Disp: , Rfl:    Continuous Blood Gluc Sensor (DEXCOM G6 SENSOR) MISC, SMARTSIG:Topical Every 10 Days, Disp: , Rfl:    Continuous Blood Gluc Transmit (DEXCOM G6 TRANSMITTER) MISC, USE TO CHECK BLOOD SUGAR EVERY DAY. CHANGE EVERY 3 MONTHS AS DIRECTED, Disp: , Rfl:    insulin aspart  (NOVOLOG FLEXPEN) 100 UNIT/ML FlexPen, Inject into the skin 3 (three) times daily before meals. ONLY WHEN INSULIN PUMP MALFUNCTION'S PER ENDOCRINOLOGY DIRECTION, Disp: , Rfl:    insulin aspart (NOVOLOG) 100 UNIT/ML injection, USE VIA INSULIN PUMP. MAX TOTAL DAILY DOSE OF 300 UNITS, Disp: 90 mL, Rfl: 5   Insulin Glargine Solostar 300 UNIT/ML SOPN, Inject 70 Units into the skin daily. Only if insullin pump malfunction., Disp: 9 mL, Rfl: 1   Insulin Human (INSULIN PUMP) SOLN, Inject 1 each into the skin See admin instructions. Medication: Novolog 100 units/ml injection. Takes 100 units via pump per patient., Disp: , Rfl:    Insulin Pen Needle (AUM MINI INSULIN PEN NEEDLE) 32G X 6 MM MISC, Use 1 unit daily to administer insulin., Disp: 100 each, Rfl: 5   Insulin Syringe-Needle U-100 31G X 5/16" 1 ML MISC, inject 10 units into the skin 3 times daily. use to inject novolog based on insulin to card ratio- max daily dose 150 units, Disp: 100 each, Rfl: 2   olopatadine (PATANOL) 0.1 % ophthalmic solution, Place 1 drop into both eyes 2 (two) times daily., Disp: 5 mL, Rfl: 2   pantoprazole (PROTONIX) 40 MG tablet, Take 1 tablet (40 mg total) by mouth daily., Disp: 30 tablet, Rfl: 3   Semaglutide, 1 MG/DOSE, (OZEMPIC, 1 MG/DOSE,) 4 MG/3ML SOPN, Inject 1 mg into the skin every 7 (seven) days., Disp: 3 mL, Rfl: 8   triamcinolone (NASACORT)  55 MCG/ACT AERO nasal inhaler, Place 2 sprays into the nose daily., Disp: 1 each, Rfl: 12   Allergies  Allergen Reactions   Morphine And Codeine Shortness Of Breath   Glyburide Diarrhea and Other (See Comments)    Reaction:  Nose bleeds    Hydrocodone Other (See Comments)    Pt states that this medication makes her "dream about rabbits chasing her"   Ivp Dye [Iodinated Contrast Media] Other (See Comments)    Shortness of breath.     Mupirocin Diarrhea   Oxycodone Other (See Comments)    Pt states that this medication makes her "dream about rabbits chasing" her.       Past Medical History:  Diagnosis Date   Anxiety    Asthma    Chronic constipation    Chronic shortness of breath    Depression    GERD (gastroesophageal reflux disease)    History of COVID-19 09/29/2019   09-28-2020 result in epic; hospital admission 10-05-2019 for covid pneumonia with hypoxic acute respiratory failure, no oxygen needed when discharged 10-06-2019;  and positive 06-13-2020 result in epic, pt stated no symptoms   History of seizures    (05-04-2021  pt stated was told during hospital admission 05/ 2021 with covid , she had 2 seizures,  stated no medication and no seizure since)   Hypertension    Insulin dependent type 2 diabetes mellitus [facility])    endocrinologist-- Fredia Sorrow NP;   pt uses insulin pump   Insulin pump in place    Mixed hyperlipidemia    OSA (obstructive sleep apnea)    ( 05-04-2021 pt stated has not used cpap since 05/ 2021) followed by dr byrum;  study in epic 02-16-2019 moderate , titrated cpap 08-28-2019   Plantar fasciitis, right    w/ chronic pain     Past Surgical History:  Procedure Laterality Date   CESAREAN SECTION  1999   PLANTAR FASCIA RELEASE Right 05/08/2021   Procedure: ENDOSCOPIC PLANTAR FASCIOTOMY;  Surgeon: Asencion Islam, DPM;  Location: [facility];  Service: Podiatry;  Laterality: Right;  WITH BLOCK   STERIOD INJECTION Bilateral 05/08/2021   Procedure: PLATELET RICH PLASMA INJECTION HEELS;  Surgeon: Asencion Islam, DPM;  Location: [facility];  Service: Podiatry;  Laterality: Bilateral;   TUBAL LIGATION Bilateral 09/29/2006   @[facility] ;   [facility]   VENTRAL HERNIA REPAIR N/A 11/27/2017   Procedure: VENTRAL HERNIA REPAIR ERAS PATHWAY;  Surgeon: Harriette Bouillon, MD;  Location: [facility];  Service: General;  Laterality: N/A;   WISDOM TOOTH EXTRACTION      Family History  Problem Relation Age of Onset   Asthma Mother    Kidney failure Mother    Brain cancer Mother    Asthma Father     Other Father        surgery on stomach but don't know from what   Breast cancer Maternal Grandmother    Diabetes Maternal Grandmother    Diabetes Brother     Social History   Tobacco Use   Smoking status: Former    Current packs/day: 0.00    Average packs/day: 0.1 packs/day for 2.0 years (0.2 ttl pk-yrs)    Types: Cigarettes, E-cigarettes    Start date: 11/28/2015    Quit date: 11/27/2017    Years since quitting: 5.7   Smokeless tobacco: Never   Tobacco comments:    I vape every now and then.  Vaping Use   Vaping status: Former  Devices: Elfvar  (05-04-2021 per pt last vaped approx 2 wks ago)  Substance Use Topics   Alcohol use: No   Drug use: Never    ROS Refer to HPI for ROS details.  Objective:   Vitals: BP 127/87 (BP Location: Right Arm)   Pulse (!) 105   Temp 98.2 F (36.8 C) (Oral)   Resp 16   LMP  (LMP Unknown)   SpO2 94%   Physical Exam Vitals and nursing note reviewed.  Constitutional:      General: She is not in acute distress.    Appearance: Normal appearance. She is well-developed. She is not ill-appearing or toxic-appearing.  HENT:     Head: Normocephalic.  Cardiovascular:     Rate and Rhythm: Normal rate.  Pulmonary:     Effort: Pulmonary effort is normal. No respiratory distress.  Skin:    General: Skin is warm and dry.     Capillary Refill: Capillary refill takes less than 2 seconds.     Findings: Wound (1 mm puncture wound to the dorsum of the right hand between thumb and index finger, no surrounding erythema, minimal swelling, mild pain with palpation, no drainage, no warmth.) present. No abrasion, erythema or rash.  Neurological:     General: No focal deficit present.     Mental Status: She is alert and oriented to person, place, and time.  Psychiatric:        Mood and Affect: Mood normal.        Behavior: Behavior normal.     Procedures  No results found for this or any previous visit (from the past 24 hours).  Assessment and  Plan :   PDMP not reviewed this encounter.  1. Puncture wound of right hand without foreign body, initial encounter    1. Puncture wound of right hand without foreign body, initial encounter (Primary) - cephALEXin (KEFLEX) 500 MG capsule; Take 1 capsule (500 mg total) by mouth 2 (two) times daily for 7 days.  Dispense: 14 capsule; Refill: 0 - ibuprofen (ADVIL) 800 MG tablet; Take 1 tablet (800 mg total) by mouth 3 (three) times daily.  Dispense: 21 tablet; Refill: 0 -Continue to monitor for signs and symptoms of severe infection such as increased redness, increased warmth, purulent drainage, increased swelling, and fever. -Continue to monitor symptoms for any change in severity if there is any escalation of current symptoms or development of new symptoms follow-up in ER for further evaluation and management.  Lucky Cowboy   [place], [place], [place] 09/09/23 [phone]

## 2023-09-09 NOTE — ED Triage Notes (Signed)
 Patient here today with c/o right hand pain since yesterday after a rusty nail poking her posterior right hand. Last Tetanus vaccine was last year.

## 2023-09-13 ENCOUNTER — Ambulatory Visit: Admitting: Internal Medicine

## 2023-09-13 ENCOUNTER — Ambulatory Visit (HOSPITAL_COMMUNITY)

## 2023-09-13 ENCOUNTER — Other Ambulatory Visit: Payer: Self-pay | Admitting: Family Medicine

## 2023-09-13 ENCOUNTER — Encounter: Payer: Self-pay | Admitting: Internal Medicine

## 2023-09-13 ENCOUNTER — Other Ambulatory Visit: Payer: Self-pay

## 2023-09-13 ENCOUNTER — Ambulatory Visit: Payer: Self-pay

## 2023-09-13 VITALS — BP 128/82 | HR 100 | Temp 98.3°F | Ht 64.0 in | Wt 246.0 lb

## 2023-09-13 DIAGNOSIS — E1100 Type 2 diabetes mellitus with hyperosmolarity without nonketotic hyperglycemic-hyperosmolar coma (NKHHC): Secondary | ICD-10-CM

## 2023-09-13 DIAGNOSIS — Z794 Long term (current) use of insulin: Secondary | ICD-10-CM

## 2023-09-13 DIAGNOSIS — I10 Essential (primary) hypertension: Secondary | ICD-10-CM | POA: Diagnosis not present

## 2023-09-13 DIAGNOSIS — S61431A Puncture wound without foreign body of right hand, initial encounter: Secondary | ICD-10-CM

## 2023-09-13 DIAGNOSIS — F418 Other specified anxiety disorders: Secondary | ICD-10-CM | POA: Diagnosis not present

## 2023-09-13 MED ORDER — ALBUTEROL SULFATE HFA 108 (90 BASE) MCG/ACT IN AERS
2.0000 | INHALATION_SPRAY | RESPIRATORY_TRACT | 5 refills | Status: AC | PRN
  Filled 2023-11-25: qty 18, 17d supply, fill #0
  Filled 2024-01-23: qty 18, 17d supply, fill #1
  Filled 2024-06-02: qty 18, 17d supply, fill #2

## 2023-09-13 MED ORDER — ALBUTEROL SULFATE HFA 108 (90 BASE) MCG/ACT IN AERS
2.0000 | INHALATION_SPRAY | RESPIRATORY_TRACT | 0 refills | Status: DC | PRN
  Filled 2023-09-13: qty 18, 17d supply, fill #0

## 2023-09-13 MED ORDER — SERTRALINE HCL 100 MG PO TABS
100.0000 mg | ORAL_TABLET | Freq: Every day | ORAL | 3 refills | Status: AC
  Filled 2023-09-13: qty 30, 30d supply, fill #0
  Filled 2023-11-25: qty 30, 30d supply, fill #1
  Filled 2023-12-26: qty 30, 30d supply, fill #2
  Filled 2024-01-20: qty 30, 30d supply, fill #3

## 2023-09-13 MED ORDER — CLONAZEPAM 0.5 MG PO TABS
0.5000 mg | ORAL_TABLET | Freq: Two times a day (BID) | ORAL | 1 refills | Status: DC | PRN
  Filled 2023-09-13: qty 20, 10d supply, fill #0

## 2023-09-13 NOTE — Telephone Encounter (Signed)
 noted
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
## 2023-09-13 NOTE — Telephone Encounter (Signed)
 Copied from CRM [id number]. Topic: Clinical - Medication Refill >> Sep 13, 2023 10:37 AM Gery Pray wrote: Most Recent Primary Care Visit:  Provider: Hoy Register  Department: [facility]  Visit Type: OFFICE VISIT  Date: 08/08/2023  Medication: SERTRALINE HCL PO  albuterol (VENTOLIN HFA) 108 (90 Base) MCG/ACT inhaler  Has the patient contacted their pharmacy? Yes (Agent: If no, request that the patient contact the pharmacy for the refill. If patient does not wish to contact the pharmacy document the reason why and proceed with request.) (Agent: If yes, when and what did the pharmacy advise?) Pharmacy sent an email  Is this the correct pharmacy for this prescription? Yes If no, delete pharmacy and type the correct one.  This is the patient's preferred pharmacy:  [facility] [address], [address] [place] [address] Phone: [phone] Fax: [phone]   Has the prescription been filled recently? No  Is the patient out of the medication? Yes  Has the patient been seen for an appointment in the last year OR does the patient have an upcoming appointment? Yes  Can we respond through MyChart? Yes  Agent: Please be advised that Rx refills may take up to 3 business days. We ask that you follow-up with your pharmacy.

## 2023-09-13 NOTE — Patient Instructions (Addendum)
 Please take all new medication as prescribed - the sertraline 100 mg per dya (but start with half pill for 3 days)  Please take all new medication as prescribed - the clonazepam for panic attack only if needed  Please continue all other medications as before, and refills have been done if requested.  Please have the pharmacy call with any other refills you may need.  Please keep your appointments with your specialists as you may have planned - Endocrinology in July 10  You will be contacted regarding the referral for: Psychiatry

## 2023-09-13 NOTE — Telephone Encounter (Signed)
 Copied from CRM [id number]. Topic: Clinical - Red Word Triage >> Sep 13, 2023 11:28 AM Sarah Garrison wrote: Red Word that prompted transfer to Nurse Triage: dealing with depression and has not had meds- has been really bad since yesterday- also had an anxiety attack last night   Chief Complaint: Anxiety  Symptoms: Increased anxiety and panic attacks Frequency: Constant increased anxiety  Pertinent Negatives: Patient denies suicidal ideations  Disposition: [] ED /[] Urgent Care (no appt availability in office) / [x] Appointment(In office/virtual)/ []  [facility]/ [] Home Care/ [] Refused Recommended Disposition /[]  Mobile Bus/ []  Follow-up with PCP Additional Notes: Patient reports she has been experiencing increased anxiety since yesterday. She states that with her increased anxiety she has also had some anxiety attacks. She states that she is out of her anxiety medication and has been having trouble getting it filled. She states that she went to urgent care and was told they are unable to help with anxiety medication. Patient would like an appointment in the office today, and would like to avoid going to the ED because she has work later today. Appointment made in an available office for evaluation of her symptoms.      Reason for Disposition  Patient sounds very upset or troubled to the triager  Answer Assessment - Initial Assessment Questions 1. CONCERN: "Did anything happen that prompted you to call today?"      Anxiety  2. ANXIETY SYMPTOMS: "Can you describe how you (your loved one; patient) have been feeling?" (e.g., tense, restless, panicky, anxious, keyed up, overwhelmed, sense of impending doom).      Anxious and panicky  3. ONSET: "How long have you been feeling this way?" (e.g., hours, days, weeks)     Yesterday 4. SEVERITY: "How would you rate the level of anxiety?" (e.g., 0 - 10; or mild, moderate, severe).     Severe  5. FUNCTIONAL IMPAIRMENT: "How have these  feelings affected your ability to do daily activities?" "Have you had more difficulty than usual doing your normal daily activities?" (e.g., getting better, same, worse; self-care, school, work, interactions)     Yes  6. HISTORY: "Have you felt this way before?" "Have you ever been diagnosed with an anxiety problem in the past?" (e.g., generalized anxiety disorder, panic attacks, PTSD). If Yes, ask: "How was this problem treated?" (e.g., medicines, counseling, etc.)     Yes 7. RISK OF HARM - SUICIDAL IDEATION: "Do you ever have thoughts of hurting or killing yourself?" If Yes, ask:  "Do you have these feelings now?" "Do you have a plan on how you would do this?"     No 8. TREATMENT:  "What has been done so far to treat this anxiety?" (e.g., medicines, relaxation strategies). "What has helped?"     Is out of her medication  Protocols used: Anxiety and Panic Attack-A-AH

## 2023-09-14 ENCOUNTER — Encounter: Payer: Self-pay | Admitting: Internal Medicine

## 2023-09-14 NOTE — Assessment & Plan Note (Signed)
 BP Readings from Last 3 Encounters:  09/13/23 128/82  09/09/23 127/87  08/08/23 133/86   Stable, pt to continue medical treatment lisiniopril 20 qd

## 2023-09-14 NOTE — Assessment & Plan Note (Signed)
 Lab Results  Component Value Date   HGBA1C 12.2 (H) 10/07/2019   Uncontrolled 2021 but as been seeing endo, next f/u jly 2025, pt to continue current medical treatment novolog pump, glargine 70 u every day, ozempic 1 mg weekly

## 2023-09-14 NOTE — Assessment & Plan Note (Signed)
 Recurring off medication, no SI or HI, for zoloft 100 every day, refer psychiatry, and limited klonopin 0.5 mg prn panic

## 2023-09-17 ENCOUNTER — Telehealth (HOSPITAL_BASED_OUTPATIENT_CLINIC_OR_DEPARTMENT_OTHER): Admitting: Family Medicine

## 2023-09-17 ENCOUNTER — Encounter: Payer: Self-pay | Admitting: Family Medicine

## 2023-09-17 DIAGNOSIS — F4310 Post-traumatic stress disorder, unspecified: Secondary | ICD-10-CM | POA: Diagnosis not present

## 2023-09-17 DIAGNOSIS — F418 Other specified anxiety disorders: Secondary | ICD-10-CM
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
## 2023-10-14 ENCOUNTER — Ambulatory Visit

## 2023-10-27 ENCOUNTER — Encounter (HOSPITAL_COMMUNITY): Payer: Self-pay

## 2023-10-27 ENCOUNTER — Ambulatory Visit (HOSPITAL_COMMUNITY)
Admission: RE | Admit: 2023-10-27 | Discharge: 2023-10-27 | Disposition: A | Discharge status: Home / Self Care | Source: Ambulatory Visit

## 2023-10-27 VITALS — BP 124/92 | HR 109 | Temp 98.2°F | Resp 18

## 2023-10-27 DIAGNOSIS — J302 Other seasonal allergic rhinitis: Secondary | ICD-10-CM | POA: Diagnosis not present

## 2023-10-27 MED ORDER — FEXOFENADINE HCL 180 MG PO TABS: 180.0000 mg | ORAL_TABLET | Freq: Every day | ORAL | 1 refills | Status: DC

## 2023-10-27 MED ORDER — AZELASTINE HCL 0.1 % NA SOLN: 1.0000 | Freq: Two times a day (BID) | NASAL | 1 refills | Status: DC

## 2023-10-27 NOTE — ED Provider Notes (Signed)
 [facility]  Note:  This document was prepared using Dragon voice recognition software and may include unintentional dictation errors.  MRN: [id number] DOB: [birth year]  Subjective:   Sarah Garrison is a 43 y.o. female presenting for nasal congestion and throat irritation with itchy watery eyes x 2 days.  Patient reports history of seasonal allergies and took Benadryl , Tylenol , Flonase  nasal spray with minimal improvement symptoms.  Patient denies any fever, headache, body aches, fatigue, shortness of breath, cough, chest pain.  No known sick contacts.  No current facility-administered medications for this encounter.  Current Outpatient Medications:    azelastine (ASTELIN) 0.1 % nasal spray, Place 1 spray into both nostrils 2 (two) times daily. Use in each nostril as directed, Disp: 30 mL, Rfl: 1   fexofenadine  (ALLEGRA ) 180 MG tablet, Take 1 tablet (180 mg total) by mouth daily., Disp: 60 tablet, Rfl: 1   NOVOFINE PEN NEEDLE 32G X 6 MM MISC, Use to administer insulin  daily, Disp: , Rfl:    albuterol  (VENTOLIN  HFA) 108 (90 Base) MCG/ACT inhaler, Inhale 2 puffs into the lungs every 4 (four) hours as needed for wheezing or shortness of breath., Disp: 18 g, Rfl: 5   atorvastatin  (LIPITOR) 40 MG tablet, Take 1 tablet (40 mg total) by mouth daily., Disp: 90 tablet, Rfl: 2   clonazePAM  (KLONOPIN ) 0.5 MG tablet, Take 1 tablet (0.5 mg total) by mouth 2 (two) times daily as needed for anxiety., Disp: 20 tablet, Rfl: 1   Continuous Blood Gluc Sensor (DEXCOM G6 SENSOR) MISC, USE AND CHANGE SENSOR EVERY 10 DAYS AS DIRECTED, Disp: , Rfl:    Continuous Blood Gluc Sensor (DEXCOM G6 SENSOR) MISC, SMARTSIG:Topical Every 10 Days, Disp: , Rfl:    Continuous Blood Gluc Transmit (DEXCOM G6 TRANSMITTER) MISC, USE TO CHECK BLOOD SUGAR EVERY DAY. CHANGE EVERY 3 MONTHS AS DIRECTED, Disp: , Rfl:    ibuprofen  (ADVIL ) 800 MG tablet, Take 1 tablet (800 mg total) by mouth 3 (three) times daily., Disp: 21  tablet, Rfl: 0   insulin  aspart (NOVOLOG  FLEXPEN) 100 UNIT/ML FlexPen, Inject into the skin 3 (three) times daily before meals. ONLY WHEN INSULIN  PUMP MALFUNCTION'S PER ENDOCRINOLOGY DIRECTION, Disp: , Rfl:    insulin  aspart (NOVOLOG ) 100 UNIT/ML injection, USE VIA INSULIN  PUMP. MAX TOTAL DAILY DOSE OF 300 UNITS, Disp: 90 mL, Rfl: 5   Insulin  Glargine Solostar 300 UNIT/ML SOPN, Inject 70 Units into the skin daily. Only if insullin pump malfunction., Disp: 9 mL, Rfl: 1   Insulin  Human (INSULIN  PUMP) SOLN, Inject 1 each into the skin See admin instructions. Medication: Novolog  100 units/ml injection. Takes 100 units via pump per patient., Disp: , Rfl:    Insulin  Syringe-Needle U-100 31G X 5/16" 1 ML MISC, inject 10 units into the skin 3 times daily. use to inject novolog  based on insulin  to card ratio- max daily dose 150 units, Disp: 100 each, Rfl: 2   lisinopril  (ZESTRIL ) 20 MG tablet, Take 1 tablet by mouth daily., Disp: , Rfl:    olopatadine  (PATANOL) 0.1 % ophthalmic solution, Place 1 drop into both eyes 2 (two) times daily., Disp: 5 mL, Rfl: 2   pantoprazole  (PROTONIX ) 40 MG tablet, Take 1 tablet (40 mg total) by mouth daily., Disp: 30 tablet, Rfl: 3   sertraline  (ZOLOFT ) 100 MG tablet, Take 1 tablet (100 mg total) by mouth daily., Disp: 30 tablet, Rfl: 3   SERTRALINE  HCL PO, Take by mouth., Disp: , Rfl:    triamcinolone  (NASACORT ) 55 MCG/ACT AERO  nasal inhaler, Place 2 sprays into the nose daily., Disp: 1 each, Rfl: 12   Allergies  Allergen Reactions   Morphine  And Codeine  Shortness Of Breath   Glyburide  Diarrhea and Other (See Comments)    Reaction:  Nose bleeds    Hydrocodone  Other (See Comments)    Pt states that this medication makes her "dream about rabbits chasing her"   Ivp Dye [Iodinated Contrast Media] Other (See Comments)    Shortness of breath.     Mupirocin  Diarrhea   Oxycodone  Other (See Comments)    Pt states that this medication makes her "dream about rabbits chasing" her.       Past Medical History:  Diagnosis Date   Anxiety    Asthma    Chronic constipation    Chronic shortness of breath    Depression    GERD (gastroesophageal reflux disease)    History of COVID-19 09/29/2019   09-28-2020 result in epic; hospital admission 10-05-2019 for covid pneumonia with hypoxic acute respiratory failure, no oxygen needed when discharged 10-06-2019;  and positive 06-13-2020 result in epic, pt stated no symptoms   History of seizures    (05-04-2021  pt stated was told during hospital admission 05/ 2021 with covid , she had 2 seizures,  stated no medication and no seizure since)   Hypertension    Insulin  dependent type 2 diabetes mellitus [facility]    endocrinologist-- Donis Furnish NP;   pt uses insulin  pump   Insulin  pump in place    Mixed hyperlipidemia    OSA (obstructive sleep apnea)    ( 05-04-2021 pt stated has not used cpap since 05/ 2021) followed by dr byrum;  study in epic 02-16-2019 moderate , titrated cpap 08-28-2019   Plantar fasciitis, right    w/ chronic pain     Past Surgical History:  Procedure Laterality Date   CESAREAN SECTION  1999   PLANTAR FASCIA RELEASE Right 05/08/2021   Procedure: ENDOSCOPIC PLANTAR FASCIOTOMY;  Surgeon: Lizzie Riis, DPM;  Location: [facility];  Service: Podiatry;  Laterality: Right;  WITH BLOCK   STERIOD INJECTION Bilateral 05/08/2021   Procedure: PLATELET RICH PLASMA INJECTION HEELS;  Surgeon: Stover, Titorya, DPM;  Location:  SURGERY CENTER;  Service: Podiatry;  Laterality: Bilateral;   TUBAL LIGATION Bilateral 09/29/2006   @[facility] ;   [facility]   VENTRAL HERNIA REPAIR N/A 11/27/2017   Procedure: VENTRAL HERNIA REPAIR ERAS PATHWAY;  Surgeon: Sim Dryer, MD;  Location: [facility];  Service: General;  Laterality: N/A;   WISDOM TOOTH EXTRACTION      Family History  Problem Relation Age of Onset   Asthma Mother    Kidney failure Mother    Brain cancer Mother    Asthma Father     Other Father        surgery on stomach but don't know from what   Breast cancer Maternal Grandmother    Diabetes Maternal Grandmother    Diabetes Brother     Social History   Tobacco Use   Smoking status: Former    Current packs/day: 0.00    Average packs/day: 0.1 packs/day for 2.0 years (0.2 ttl pk-yrs)    Types: Cigarettes, E-cigarettes    Start date: 11/28/2015    Quit date: 11/27/2017    Years since quitting: 5.9   Smokeless tobacco: Never   Tobacco comments:    I vape every now and then.  Vaping Use   Vaping status: Former   Devices: Elfvar  (  05-04-2021 per pt last vaped approx 2 wks ago)  Substance Use Topics   Alcohol use: No   Drug use: Never    ROS Refer to HPI for ROS details.  Objective:   Vitals: BP (!) 124/92 (BP Location: Left Arm)   Pulse (!) 109   Temp 98.2 F (36.8 C) (Oral)   Resp 18   SpO2 94%   Physical Exam Vitals and nursing note reviewed.  Constitutional:      General: She is not in acute distress.    Appearance: Normal appearance. She is well-developed. She is not ill-appearing or toxic-appearing.  HENT:     Head: Normocephalic and atraumatic.     Right Ear: Tympanic membrane, ear canal and external ear normal.     Left Ear: Tympanic membrane, ear canal and external ear normal.     Nose: Congestion and rhinorrhea present.     Mouth/Throat:     Mouth: Mucous membranes are moist.     Pharynx: No oropharyngeal exudate or posterior oropharyngeal erythema.  Eyes:     General:        Right eye: No discharge.        Left eye: No discharge.     Extraocular Movements: Extraocular movements intact.     Conjunctiva/sclera: Conjunctivae normal.  Cardiovascular:     Rate and Rhythm: Normal rate.  Pulmonary:     Effort: Pulmonary effort is normal. No respiratory distress.  Skin:    General: Skin is warm and dry.  Neurological:     General: No focal deficit present.     Mental Status: She is alert and oriented to person, place, and time.   Psychiatric:        Mood and Affect: Mood normal.        Behavior: Behavior normal.     Procedures  No results found for this or any previous visit (from the past 24 hours).  No results found.   Assessment and Plan :     Discharge Instructions       1. Seasonal allergies (Primary) - azelastine (ASTELIN) 0.1 % nasal spray; Place 1 spray into both nostrils 2 (two) times daily. Use in each nostril as directed  Dispense: 30 mL; Refill: 1 - fexofenadine  (ALLEGRA ) 180 MG tablet; Take 1 tablet (180 mg total) by mouth daily.  Dispense: 60 tablet; Refill: 1 -Continue to monitor symptoms for any change in severity if there is any escalation of current symptoms or development of new symptoms follow-up in ER for further evaluation and management.    Nehemiah Montee B Cybil [place]   Jaber Dunlow B, [place] 10/27/23 [phone]

## 2023-10-27 NOTE — Discharge Instructions (Addendum)
  1. Seasonal allergies (Primary) - azelastine (ASTELIN) 0.1 % nasal spray; Place 1 spray into both nostrils 2 (two) times daily. Use in each nostril as directed  Dispense: 30 mL; Refill: 1 - fexofenadine  (ALLEGRA ) 180 MG tablet; Take 1 tablet (180 mg total) by mouth daily.  Dispense: 60 tablet; Refill: 1 -Continue to monitor symptoms for any change in severity if there is any escalation of current symptoms or development of new symptoms follow-up in ER for further evaluation and management.

## 2023-10-27 NOTE — ED Triage Notes (Signed)
 Pt reports yesterday had body aches, congestion, headache. Reports feels her allergies. Took Benadryl , Tylenol , nasal spray.

## 2023-10-30 ENCOUNTER — Encounter: Payer: Self-pay | Admitting: Family Medicine

## 2023-10-30 ENCOUNTER — Ambulatory Visit: Payer: Self-pay

## 2023-10-30 NOTE — Telephone Encounter (Signed)
 Patient has appointment scheduled in office.

## 2023-10-30 NOTE — Telephone Encounter (Signed)
 Copied from CRM [id number]. Topic: Clinical - Red Word Triage >> Oct 30, 2023 12:38 PM Sarah Garrison wrote: Red Word that prompted transfer to Nurse Triage: The patient states her allergies are worsening and the medication that was prescribed is not helping. She is having difficulty breathing due to nasal congestion and her eyes are irrated.  Chief Complaint: Flare-up of allergies Symptoms: Nasal congestion, itchy eyes Frequency: Ongoing, worsening past few days Pertinent Negatives: Patient denies airway swelling Disposition: [] ED /[] Urgent Care (no appt availability in office) / [x] Appointment(In office/virtual)/ []  [facility]/ [] Home Care/ [] Refused Recommended Disposition /[]  Mobile Bus/ []  Follow-up with PCP Additional Notes: Patient called in because her allergies are flaring up. Patient complained of nasal congestion and itchy eyes. Patient stated she is wheezing at times. Patient stated she has been out of work for 3 days. Patient denied swelling of airway and tongue. Patient was seen in UC on 10/27/23 and stated medications prescribed are not helping. Advised patient to be seen within 3 days, per protocol. No availability in office. Provided care advice and instructed patient to call back if symptoms worsen. Patient complied. Please contact patient to schedule appointment.   Patient also requested a refill of cetirizine  10mg , but this RN does not see that medication in MAR.   Reason for Disposition  [1] Taking antihistamines > 2 days AND [2] nasal allergy symptoms interfere with sleep, school, or work  Answer Assessment - Initial Assessment Questions 1. SYMPTOM: "What's the main symptom you're concerned about?" (e.g., runny nose, stuffiness, sneezing, itching)     Nasal congestion 2. SEVERITY: "How bad is it?" "What does it keep you from doing?" (e.g., sleeping, working)      States she is unable to work because they think she has a cold 3. EYES: "Are the eyes also red,  watery, and itchy?"      Itchy  4. TRIGGER: "What pollen or other allergic substance do you think is causing the symptoms?"      States she has allergies year round 5. TREATMENT: "What medicine are you using?" "What medicine worked best in the past?"     Nasal spray and Allegra  prescribed by UC 6. OTHER SYMPTOMS: "Do you have any other symptoms?" (e.g., coughing, difficulty breathing, wheezing)     Congestion, itchy eyes, wheezing Recently seen in UC, prescribed medications that are not working  Protocols used: Nasal Allergies (Hay Fever)-A-AH

## 2023-10-30 NOTE — Telephone Encounter (Signed)
 Patient has been schedule Friday 30th of May

## 2023-10-30 NOTE — Telephone Encounter (Signed)
 This encounter was created in error - please disregard.

## 2023-10-31 ENCOUNTER — Ambulatory Visit

## 2023-11-01 ENCOUNTER — Encounter: Payer: Self-pay | Admitting: Internal Medicine

## 2023-11-01 ENCOUNTER — Ambulatory Visit: Attending: Internal Medicine | Admitting: Internal Medicine

## 2023-11-01 ENCOUNTER — Other Ambulatory Visit: Payer: Self-pay

## 2023-11-01 VITALS — BP 139/85 | HR 105 | Ht 64.0 in | Wt 247.2 lb

## 2023-11-01 DIAGNOSIS — J302 Other seasonal allergic rhinitis: Secondary | ICD-10-CM

## 2023-11-01 DIAGNOSIS — Z3042 Encounter for surveillance of injectable contraceptive: Secondary | ICD-10-CM | POA: Diagnosis not present

## 2023-11-01 MED ORDER — BUDESONIDE 32 MCG/ACT NA SUSP
1.0000 | Freq: Every day | NASAL | 1 refills | Status: AC
  Filled 2023-11-01: qty 8.43, fill #0
  Filled 2024-03-27: qty 8.43, 30d supply, fill #0

## 2023-11-01 MED ORDER — MEDROXYPROGESTERONE ACETATE 150 MG/ML IM SUSP
150.0000 mg | Freq: Once | INTRAMUSCULAR | Status: AC
  Administered 2023-11-01: 150 mg via INTRAMUSCULAR

## 2023-11-01 MED ORDER — CETIRIZINE HCL 10 MG PO TABS
10.0000 mg | ORAL_TABLET | Freq: Every day | ORAL | 3 refills | Status: AC
  Filled 2023-11-01 – 2023-11-25 (×2): qty 30, 30d supply, fill #0
  Filled 2024-01-20: qty 30, 30d supply, fill #1
  Filled 2024-03-27 – 2024-05-25 (×2): qty 30, 30d supply, fill #2

## 2023-11-01 NOTE — Patient Instructions (Signed)
 VISIT SUMMARY:  Today, you were seen for nasal congestion and eye irritation that have been bothering you for the past 1-2 days. We discussed your current medications and made some adjustments to help manage your symptoms better.  YOUR PLAN:  -ALLERGIC RHINITIS: Allergic rhinitis is an allergic reaction that causes nasal congestion and eye irritation. You should stop using Astelin nasal spray as it worsened your symptoms. We will prescribe Zyrtec , which has been effective for you, and Rhinocort nasal spray to help manage your symptoms.  -CONTRACEPTIVE MANAGEMENT: You are due for your Depo-Provera  injection today, which is a form of birth control that you receive every three months.  INSTRUCTIONS:  Please pick up your prescriptions for Zyrtec  and Rhinocort nasal spray. Confirm your sertraline  refills at [facility]. Make sure to get your Depo-Provera  injection today.

## 2023-11-01 NOTE — Progress Notes (Signed)
 Patient ID: JAZZLYNN RAWE, female    DOB: 04/11/1981  MRN: [id number]  CC: Sinus Problem (Poss seasonal allergies, ear pain, congestion)   Subjective: Bryleigh Ottaway is a 43 y.o. female who presents for UC visit. PCP is Dr. Adan Holms Her concerns today include:  Review of DM, GERD, GAD, MDD  Discussed the use of AI scribe software for clinical note transcription with the patient, who gave verbal consent to proceed.  History of Present Illness KAYLAH CHIASSON is a 43 year old female who presents with nasal congestion and eye irritation. She was seen at urgent care on 10/27/2023 for allergy symptoms including nasal congestion, throat irritation and eye irritation.  She was prescribed Astelin nasal spray and Allegra .  She was not able to get the Allegra . Astelin nasal spray was used but worsened symptoms, causing increased nasal congestion, earache, headache, and increased use of her asthma inhaler. She continues using Zyrtec , which alleviates nasal congestion, but has been out of it for two days. She has used Nasacort  and Rhinocort in the past but is currently out of them. An attempt was made to use an old nasal spray bottle by adding water .  Due for Depo shot.  She would like to get that while she is here today as well.    Patient Active Problem List   Diagnosis Date Noted   Acute abdominal pain in left lower quadrant 11/01/2022   Left tennis elbow 08/24/2022   Pain in both knees 08/24/2022   Neurogenic pain of right foot 08/24/2022   Pain of right lower extremity 07/26/2022   Palpitations 07/31/2021   Fatigue 07/31/2021   Primary hypertension 07/31/2021   Hip pain 12/08/2019   Hyperlipidemia associated with type 2 diabetes mellitus (HCC) 11/23/2019   History of COVID-19 10/27/2019   Stuttering 10/27/2019   Depression with anxiety 10/27/2019   Obesity, Class III, BMI 40-49.9 (morbid obesity) 06/30/2019   OSA (obstructive sleep apnea) 02/16/2019   Essential hypertension  06/22/2018   Type 2 diabetes mellitus with hyperglycemia, with long-term current use of insulin  (HCC) 06/22/2018   Type 2 diabetes mellitus with hyperosmolarity without coma, with long-term current use of insulin  (HCC) 12/13/2016   Asthma 07/30/2016   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/07/2016   DM neuropathy, type II diabetes mellitus (HCC) 08/24/2015     Current Outpatient Medications on File Prior to Visit  Medication Sig Dispense Refill   albuterol  (VENTOLIN  HFA) 108 (90 Base) MCG/ACT inhaler Inhale 2 puffs into the lungs every 4 (four) hours as needed for wheezing or shortness of breath. 18 g 5   atorvastatin  (LIPITOR) 40 MG tablet Take 1 tablet (40 mg total) by mouth daily. 90 tablet 2   clonazePAM  (KLONOPIN ) 0.5 MG tablet Take 1 tablet (0.5 mg total) by mouth 2 (two) times daily as needed for anxiety. 20 tablet 1   Continuous Blood Gluc Sensor (DEXCOM G6 SENSOR) MISC USE AND CHANGE SENSOR EVERY 10 DAYS AS DIRECTED     Continuous Blood Gluc Sensor (DEXCOM G6 SENSOR) MISC SMARTSIG:Topical Every 10 Days     Continuous Blood Gluc Transmit (DEXCOM G6 TRANSMITTER) MISC USE TO CHECK BLOOD SUGAR EVERY DAY. CHANGE EVERY 3 MONTHS AS DIRECTED     insulin  aspart (NOVOLOG  FLEXPEN) 100 UNIT/ML FlexPen Inject into the skin 3 (three) times daily before meals. ONLY WHEN INSULIN  PUMP MALFUNCTION'S PER ENDOCRINOLOGY DIRECTION     insulin  aspart (NOVOLOG ) 100 UNIT/ML injection USE VIA INSULIN  PUMP. MAX TOTAL DAILY DOSE OF 300 UNITS 90 mL 5  Insulin  Glargine Solostar 300 UNIT/ML SOPN Inject 70 Units into the skin daily. Only if insullin pump malfunction. 9 mL 1   Insulin  Human (INSULIN  PUMP) SOLN Inject 1 each into the skin See admin instructions. Medication: Novolog  100 units/ml injection. Takes 100 units via pump per patient.     Insulin  Syringe-Needle U-100 31G X 5/16" 1 ML MISC inject 10 units into the skin 3 times daily. use to inject novolog  based on insulin  to card ratio- max daily dose 150 units 100 each 2    lisinopril  (ZESTRIL ) 20 MG tablet Take 1 tablet by mouth daily.     NOVOFINE PEN NEEDLE 32G X 6 MM MISC Use to administer insulin  daily     olopatadine  (PATANOL) 0.1 % ophthalmic solution Place 1 drop into both eyes 2 (two) times daily. 5 mL 2   pantoprazole  (PROTONIX ) 40 MG tablet Take 1 tablet (40 mg total) by mouth daily. 30 tablet 3   sertraline  (ZOLOFT ) 100 MG tablet Take 1 tablet (100 mg total) by mouth daily. 30 tablet 3   SERTRALINE  HCL PO Take by mouth.     ibuprofen  (ADVIL ) 800 MG tablet Take 1 tablet (800 mg total) by mouth 3 (three) times daily. (Patient not taking: Reported on 11/01/2023) 21 tablet 0   [DISCONTINUED] ARIPiprazole  (ABILIFY ) 5 MG tablet Take 1 tablet (5 mg total) by mouth daily. 30 tablet 2   No current facility-administered medications on file prior to visit.    Allergies  Allergen Reactions   Morphine  And Codeine  Shortness Of Breath   Glyburide  Diarrhea and Other (See Comments)    Reaction:  Nose bleeds    Hydrocodone  Other (See Comments)    Pt states that this medication makes her "dream about rabbits chasing her"   Ivp Dye [Iodinated Contrast Media] Other (See Comments)    Shortness of breath.     Mupirocin  Diarrhea   Oxycodone  Other (See Comments)    Pt states that this medication makes her "dream about rabbits chasing" her.      Social History   Socioeconomic History   Marital status: Single    Spouse name: Not on file   Number of children: 4   Years of education: Not on file   Highest education level: Not on file  Occupational History   Not on file  Tobacco Use   Smoking status: Former    Current packs/day: 0.00    Average packs/day: 0.1 packs/day for 2.0 years (0.2 ttl pk-yrs)    Types: Cigarettes, E-cigarettes    Start date: 11/28/2015    Quit date: 11/27/2017    Years since quitting: 5.9   Smokeless tobacco: Never   Tobacco comments:    I vape every now and then.  Vaping Use   Vaping status: Former   Devices: Elfvar  (05-04-2021 per  pt last vaped approx 2 wks ago)  Substance and Sexual Activity   Alcohol use: No   Drug use: Never   Sexual activity: Yes    Birth control/protection: Surgical, Injection  Other Topics Concern   Not on file  Social History Narrative   Right handed   No caffeine  use   Diet coke sometimes    Social Drivers of Corporate investment banker Strain: Not on file  Food Insecurity: Low Risk  (10/23/2023)   Received from [facility]   Hunger Vital Sign    Worried About Running Out of Food in the Last Year: Never true    Ran Out of  Food in the Last Year: Never true  Transportation Needs: No Transportation Needs (10/23/2023)   Received from [facility]    In the past 12 months, has lack of reliable transportation kept you from medical appointments, meetings, work or from getting things needed for daily living? : No  Physical Activity: Not on file  Stress: Not on file  Social Connections: Not on file  Intimate Partner Violence: Not on file    Family History  Problem Relation Age of Onset   Asthma Mother    Kidney failure Mother    Brain cancer Mother    Asthma Father    Other Father        surgery on stomach but don't know from what   Breast cancer Maternal Grandmother    Diabetes Maternal Grandmother    Diabetes Brother     Past Surgical History:  Procedure Laterality Date   CESAREAN SECTION  1999   PLANTAR FASCIA RELEASE Right 05/08/2021   Procedure: ENDOSCOPIC PLANTAR FASCIOTOMY;  Surgeon: Lizzie Riis, DPM;  Location: [facility];  Service: Podiatry;  Laterality: Right;  WITH BLOCK   STERIOD INJECTION Bilateral 05/08/2021   Procedure: PLATELET RICH PLASMA INJECTION HEELS;  Surgeon: Stover, Titorya, DPM;  Location: [facility];  Service: Podiatry;  Laterality: Bilateral;   TUBAL LIGATION Bilateral 09/29/2006   @[facility] ;   [facility]   VENTRAL HERNIA REPAIR N/A 11/27/2017   Procedure: VENTRAL HERNIA REPAIR ERAS PATHWAY;  Surgeon:  Sim Dryer, MD;  Location: [facility];  Service: General;  Laterality: N/A;   WISDOM TOOTH EXTRACTION      ROS: Review of Systems Negative except as stated above  PHYSICAL EXAM: BP 139/85   Pulse (!) 105   Ht 5\' 4"  (1.626 m)   Wt 247 lb 3.2 oz (112.1 kg)   SpO2 100%   BMI 42.43 kg/m   Physical Exam  General appearance - alert, well appearing, and in no distress Mental status - normal mood, behavior, speech, dress, motor activity, and thought processes Ears - bilateral TM's and external ear canals normal Nose -moderate enlargement of nasal turbinates right greater than left. Mouth -throat is without erythema or exudates.      Latest Ref Rng & Units 11/01/2022   10:48 AM 05/31/2022    4:56 PM 05/27/2021    6:11 PM  CMP  Glucose 70 - 99 mg/dL 086  578  469   BUN 6 - 20 mg/dL 8  16  9    Creatinine 0.44 - 1.00 mg/dL 6.29  5.28  4.13   Sodium 135 - 145 mmol/L 138  141  137   Potassium 3.5 - 5.1 mmol/L 3.5  4.1  3.9   Chloride 98 - 111 mmol/L 108  106  108   CO2 22 - 32 mmol/L 23  23  21    Calcium  8.9 - 10.3 mg/dL 9.2  9.7  8.8   Total Protein 6.5 - 8.1 g/dL 6.9  6.9    Total Bilirubin 0.3 - 1.2 mg/dL 0.9  0.2    Alkaline Phos 38 - 126 U/L 75  112    AST 15 - 41 U/L 18  15    ALT 0 - 44 U/L 19  23     Lipid Panel     Component Value Date/Time   CHOL 244 (H) 10/26/2020 0908   TRIG 85 10/26/2020 0908   HDL 50 10/26/2020 0908   CHOLHDL 4.9 (H) 10/26/2020 0908  CHOLHDL 6 03/03/2018 1419   VLDL 37.4 03/03/2018 1419   LDLCALC 179 (H) 10/26/2020 0908    CBC    Component Value Date/Time   WBC 7.8 11/01/2022 1048   RBC 4.72 11/01/2022 1048   HGB 14.1 11/01/2022 1048   HGB 14.5 10/26/2020 0908   HCT 42.2 11/01/2022 1048   HCT 44.5 10/26/2020 0908   PLT 347 11/01/2022 1048   PLT 339 10/26/2020 0908   MCV 89.4 11/01/2022 1048   MCV 90 10/26/2020 0908   MCH 29.9 11/01/2022 1048   MCHC 33.4 11/01/2022 1048   RDW 12.2 11/01/2022 1048   RDW 12.4  10/26/2020 0908   LYMPHSABS 3.3 05/27/2021 1811   LYMPHSABS 3.1 10/26/2020 0908   MONOABS 0.5 05/27/2021 1811   EOSABS 0.1 05/27/2021 1811   EOSABS 0.0 10/26/2020 0908   BASOSABS 0.0 05/27/2021 1811   BASOSABS 0.0 10/26/2020 0908    ASSESSMENT AND PLAN: 1. Seasonal allergies (Primary) Patient reports Zyrtec  works best for her.  Refill given on this and Rhinocort  nasal spray. - budesonide  (RHINOCORT  ALLERGY) 32 MCG/ACT nasal spray; Place 1 spray into both nostrils daily.  Dispense: 8.43 mL; Refill: 1 - cetirizine  (ZYRTEC ) 10 MG tablet; Take 1 tablet (10 mg total) by mouth daily.  Dispense: 30 tablet; Refill: 3  2. Family planning, Depo-Provera  contraception monitoring/administration Depo shot given today by CMA [place].    Patient was given the opportunity to ask questions.  Patient verbalized understanding of the plan and was able to repeat key elements of the plan.   This documentation was completed using Paediatric nurse.  Any transcriptional errors are unintentional.  No orders of the defined types were placed in this encounter.    Requested Prescriptions   Signed Prescriptions Disp Refills   budesonide  (RHINOCORT  ALLERGY) 32 MCG/ACT nasal spray 8.43 mL 1    Sig: Place 1 spray into both nostrils daily.   cetirizine  (ZYRTEC ) 10 MG tablet 30 tablet 3    Sig: Take 1 tablet (10 mg total) by mouth daily.    Return if symptoms worsen or fail to improve.  Concetta Dee, MD, FACP

## 2023-11-13 ENCOUNTER — Other Ambulatory Visit: Payer: Self-pay

## 2023-11-25 ENCOUNTER — Other Ambulatory Visit: Payer: Self-pay

## 2023-11-25 ENCOUNTER — Encounter (HOSPITAL_COMMUNITY): Payer: Self-pay

## 2023-11-25 ENCOUNTER — Ambulatory Visit (HOSPITAL_COMMUNITY)
Admission: RE | Admit: 2023-11-25 | Discharge: 2023-11-25 | Disposition: A | Discharge status: Home / Self Care | Source: Ambulatory Visit

## 2023-11-25 VITALS — BP 154/101 | HR 100 | Temp 98.3°F | Resp 16

## 2023-11-25 DIAGNOSIS — Z9641 Presence of insulin pump (external) (internal): Secondary | ICD-10-CM | POA: Insufficient documentation

## 2023-11-25 DIAGNOSIS — Z794 Long term (current) use of insulin: Secondary | ICD-10-CM | POA: Diagnosis not present

## 2023-11-25 DIAGNOSIS — E119 Type 2 diabetes mellitus without complications: Secondary | ICD-10-CM | POA: Insufficient documentation

## 2023-11-25 DIAGNOSIS — L02411 Cutaneous abscess of right axilla: Secondary | ICD-10-CM | POA: Diagnosis present

## 2023-11-25 MED ORDER — LIDOCAINE HCL (PF) 1 % IJ SOLN
INTRAMUSCULAR | Status: AC
  Filled 2023-11-25: qty 2

## 2023-11-25 MED ORDER — LIDOCAINE HCL 2 % IJ SOLN
INTRAMUSCULAR | Status: AC
Start: 2023-11-25 — End: 2023-11-25
  Filled 2023-11-25: qty 20

## 2023-11-25 MED ORDER — SULFAMETHOXAZOLE-TRIMETHOPRIM 800-160 MG PO TABS
1.0000 | ORAL_TABLET | Freq: Two times a day (BID) | ORAL | 0 refills | Status: AC
Start: 2023-11-25 — End: 2023-12-05
  Filled 2023-11-25: qty 20, 10d supply, fill #0

## 2023-11-25 MED ORDER — FLUCONAZOLE 150 MG PO TABS
150.0000 mg | ORAL_TABLET | ORAL | 0 refills | Status: AC
  Filled 2023-11-25: qty 2, 6d supply, fill #0

## 2023-11-25 NOTE — Discharge Instructions (Signed)
 You were seen today for an abscess.  An abscess is an infection under the skin that can happen anywhere on the body. If not treated, it can get worse and cause a more serious infection.  Today, we performed a procedure called an I&D, which stands for incision and drainage. This means a small cut was made to let the infection and pus drain out. The area was cleaned thoroughly, and a gauze packing was placed inside the open area to help it heal properly. This packing should stay in place until you return for your follow-up visit. A sample of the drainage was sent to the lab to check for any bacteria.  You have been prescribed antibiotics. Be sure to take them exactly as directed, and always take them with food to help avoid an upset stomach. Keep using warm, moist compresses on the area to help with healing.   Watch the area closely for any signs that the infection is getting worse, such as increased redness, swelling, pain, pus, or fever. If you notice any of these symptoms, contact your doctor, return to urgent care or go to the ED right away.

## 2023-11-25 NOTE — ED Notes (Signed)
 At bedside for procedure.  Patient tolerated well

## 2023-11-25 NOTE — ED Provider Notes (Addendum)
 [facility]    CSN: [id number] Arrival date & time: 11/25/23  0932      History   Chief Complaint Chief Complaint  Patient presents with   Abscess    boil under my arm pit and it hurts bad - Entered by patient    HPI Sarah Garrison is a 43 y.o. female.   Discussed the use of AI scribe software for clinical note transcription with the patient, who gave verbal consent to proceed.   Sarah Garrison is a 43 y.o. female with a history of IDDM presents with a boil under their armpit that has been present for 4 days. The patient reports a history of previous boils in the same area requiring I&D. The patient denies any drainage from the boil. She also denies experiencing fevers, chills, or body aches. The patient's last HbA1c was reported 7.2 back in March.   The following portions of the patient's history were reviewed and updated as appropriate: allergies, current medications, past family history, past medical history, past social history, past surgical history, and problem list.    Past Medical History:  Diagnosis Date   Anxiety    Asthma    Chronic constipation    Chronic shortness of breath    Depression    GERD (gastroesophageal reflux disease)    History of COVID-19 09/29/2019   09-28-2020 result in epic; hospital admission 10-05-2019 for covid pneumonia with hypoxic acute respiratory failure, no oxygen needed when discharged 10-06-2019;  and positive 06-13-2020 result in epic, pt stated no symptoms   History of seizures    (05-04-2021  pt stated was told during hospital admission 05/ 2021 with covid , she had 2 seizures,  stated no medication and no seizure since)   Hypertension    Insulin  dependent type 2 diabetes mellitus [facility])    endocrinologist-- warren batty NP;   pt uses insulin  pump   Insulin  pump in place    Mixed hyperlipidemia    OSA (obstructive sleep apnea)    ( 05-04-2021 pt stated has not used cpap since 05/ 2021) followed by dr byrum;   study in epic 02-16-2019 moderate , titrated cpap 08-28-2019   Plantar fasciitis, right    w/ chronic pain    Patient Active Problem List   Diagnosis Date Noted   Acute abdominal pain in left lower quadrant 11/01/2022   Left tennis elbow 08/24/2022   Pain in both knees 08/24/2022   Neurogenic pain of right foot 08/24/2022   Pain of right lower extremity 07/26/2022   Palpitations 07/31/2021   Fatigue 07/31/2021   Primary hypertension 07/31/2021   Hip pain 12/08/2019   Hyperlipidemia associated with type 2 diabetes mellitus (HCC) 11/23/2019   History of COVID-19 10/27/2019   Stuttering 10/27/2019   Depression with anxiety 10/27/2019   Obesity, Class III, BMI 40-49.9 (morbid obesity) 06/30/2019   OSA (obstructive sleep apnea) 02/16/2019   Essential hypertension 06/22/2018   Type 2 diabetes mellitus with hyperglycemia, with long-term current use of insulin  (HCC) 06/22/2018   Type 2 diabetes mellitus with hyperosmolarity without coma, with long-term current use of insulin  (HCC) 12/13/2016   Asthma 07/30/2016   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/07/2016   DM neuropathy, type II diabetes mellitus (HCC) 08/24/2015    Past Surgical History:  Procedure Laterality Date   CESAREAN SECTION  1999   PLANTAR FASCIA RELEASE Right 05/08/2021   Procedure: ENDOSCOPIC PLANTAR FASCIOTOMY;  Surgeon: Stover, Titorya, DPM;  Location: [facility];  Service: Podiatry;  Laterality: Right;  WITH BLOCK   STERIOD INJECTION Bilateral 05/08/2021   Procedure: PLATELET RICH PLASMA INJECTION HEELS;  Surgeon: Stover, Titorya, DPM;  Location: [facility];  Service: Podiatry;  Laterality: Bilateral;   TUBAL LIGATION Bilateral 09/29/2006   @[facility] ;   [facility]   VENTRAL HERNIA REPAIR N/A 11/27/2017   Procedure: VENTRAL HERNIA REPAIR ERAS PATHWAY;  Surgeon: Vanderbilt Ned, MD;  Location: [facility];  Service: General;  Laterality: N/A;   WISDOM TOOTH EXTRACTION      OB History     Gravida   5   Para  4   Term      Preterm      AB  1   Living  4      SAB  1   IAB      Ectopic      Multiple      Live Births               Home Medications    Prior to Admission medications   Medication Sig Start Date End Date Taking? Authorizing Provider  fluconazole  (DIFLUCAN ) 150 MG tablet Take 1 tablet (150 mg total) by mouth every 3 (three) days for 2 doses. 11/25/23 12/01/23 Yes Iola Lukes, FNP  OZEMPIC , 1 MG/DOSE, 4 MG/3ML SOPN Inject 1 mg into the skin. 10/17/23  Yes [provider]  sulfamethoxazole -trimethoprim  (BACTRIM  DS) 800-160 MG tablet Take 1 tablet by mouth 2 (two) times daily for 10 days. 11/25/23 12/05/23 Yes Iola Lukes, FNP  albuterol  (VENTOLIN  HFA) 108 (90 Base) MCG/ACT inhaler Inhale 2 puffs into the lungs every 4 (four) hours as needed for wheezing or shortness of breath. 09/13/23   Norleen Lynwood ORN, MD  atorvastatin  (LIPITOR) 40 MG tablet Take 1 tablet (40 mg total) by mouth daily. 08/12/23   Newlin, Enobong, MD  budesonide  (RHINOCORT  ALLERGY) 32 MCG/ACT nasal spray Place 1 spray into both nostrils daily. 11/01/23   Vicci Barnie NOVAK, MD  cetirizine  (ZYRTEC ) 10 MG tablet Take 1 tablet (10 mg total) by mouth daily. 11/01/23   Vicci Barnie NOVAK, MD  Continuous Blood Gluc Sensor (DEXCOM G6 SENSOR) MISC USE AND CHANGE SENSOR EVERY 10 DAYS AS DIRECTED 03/14/22   [provider]  Continuous Blood Gluc Sensor (DEXCOM G6 SENSOR) MISC SMARTSIG:Topical Every 10 Days    [provider]  Continuous Blood Gluc Transmit (DEXCOM G6 TRANSMITTER) MISC USE TO CHECK BLOOD SUGAR EVERY DAY. CHANGE EVERY 3 MONTHS AS DIRECTED 03/14/22   [provider]  insulin  aspart (NOVOLOG  FLEXPEN) 100 UNIT/ML FlexPen Inject into the skin 3 (three) times daily before meals. ONLY WHEN INSULIN  PUMP MALFUNCTION'S PER ENDOCRINOLOGY DIRECTION    [provider]  insulin  aspart (NOVOLOG ) 100 UNIT/ML injection USE VIA INSULIN  PUMP. MAX TOTAL DAILY  DOSE OF 300 UNITS 08/06/23     Insulin  Glargine Solostar 300 UNIT/ML SOPN Inject 70 Units into the skin daily. Only if insullin pump malfunction. 11/27/22     Insulin  Human (INSULIN  PUMP) SOLN Inject 1 each into the skin See admin instructions. Medication: Novolog  100 units/ml injection. Takes 100 units via pump per patient.    [provider]  Insulin  Syringe-Needle U-100 31G X 5/16 1 ML MISC inject 10 units into the skin 3 times daily. use to inject novolog  based on insulin  to card ratio- max daily dose 150 units 09/23/20     lisinopril  (ZESTRIL ) 20 MG tablet Take 1 tablet by mouth daily. 08/06/23   [provider]  NOVOFINE PEN NEEDLE 32G X 6 MM MISC Use to administer insulin  daily 10/23/23   [provider]  olopatadine  (PATANOL) 0.1 % ophthalmic solution Place 1 drop into both eyes 2 (two) times daily. 08/08/23   Newlin, Enobong, MD  pantoprazole  (PROTONIX ) 40 MG tablet Take 1 tablet (40 mg total) by mouth daily. 10/23/22   Newlin, Enobong, MD  sertraline  (ZOLOFT ) 100 MG tablet Take 1 tablet (100 mg total) by mouth daily. 09/13/23   Norleen Lynwood ORN, MD  SERTRALINE  HCL PO Take by mouth.    [provider]  ARIPiprazole  (ABILIFY ) 5 MG tablet Take 1 tablet (5 mg total) by mouth daily. 10/27/19 06/13/20  Sater, Charlie LABOR, MD    Family History Family History  Problem Relation Age of Onset   Asthma Mother    Kidney failure Mother    Brain cancer Mother    Asthma Father    Other Father        surgery on stomach but don't know from what   Breast cancer Maternal Grandmother    Diabetes Maternal Grandmother    Diabetes Brother     Social History Social History   Tobacco Use   Smoking status: Former    Current packs/day: 0.00    Average packs/day: 0.1 packs/day for 2.0 years (0.2 ttl pk-yrs)    Types: Cigarettes, E-cigarettes    Start date: 11/28/2015    Quit date: 11/27/2017    Years since quitting: 5.9   Smokeless tobacco: Never   Tobacco comments:    I vape  every now and then.  Vaping Use   Vaping status: Former   Devices: Elfvar  (05-04-2021 per pt last vaped approx 2 wks ago)  Substance Use Topics   Alcohol use: No   Drug use: Never     Allergies   Morphine  and codeine , Glyburide , Hydrocodone , Ivp dye [iodinated contrast media], Mupirocin , and Oxycodone    Review of Systems Review of Systems  Constitutional:  Negative for chills and fever.  Musculoskeletal:  Negative for myalgias.  Skin:  Positive for wound.  All other systems reviewed and are negative.    Physical Exam Triage Vital Signs ED Triage Vitals  Encounter Vitals Group     BP 11/25/23 0948 (!) 154/101     Girls Systolic BP Percentile --      Girls Diastolic BP Percentile --      Boys Systolic BP Percentile --      Boys Diastolic BP Percentile --      Pulse Rate 11/25/23 0948 100     Resp 11/25/23 0948 16     Temp 11/25/23 0948 98.3 F (36.8 C)     Temp Source 11/25/23 0948 Oral     SpO2 11/25/23 0948 98 %     Weight --      Height --      Head Circumference --      Peak Flow --      Pain Score 11/25/23 0947 7     Pain Loc --      Pain Education --      Exclude from Growth Chart --    No data found.  Updated Vital Signs BP (!) 154/101 (BP Location: Right Arm)   Pulse 100   Temp 98.3 F (36.8 C) (Oral)   Resp 16   LMP  (LMP Unknown)   SpO2 98%   Visual Acuity Right Eye Distance:   Left Eye Distance:   Bilateral Distance:    Right  Eye Near:   Left Eye Near:    Bilateral Near:     Physical Exam Vitals reviewed.  Constitutional:      General: She is awake. She is not in acute distress.    Appearance: Normal appearance. She is well-developed. She is not ill-appearing or toxic-appearing.  HENT:     Head: Normocephalic.     Mouth/Throat:     Mouth: Mucous membranes are moist.   Eyes:     Conjunctiva/sclera: Conjunctivae normal.    Cardiovascular:     Rate and Rhythm: Normal rate and regular rhythm.     Heart sounds: Normal heart  sounds.  Pulmonary:     Effort: Pulmonary effort is normal.     Breath sounds: Normal breath sounds.   Musculoskeletal:        General: Normal range of motion.   Skin:    General: Skin is warm and dry.     Findings: Abscess present.     Comments: Right axilla abscess noted with underlying fluctuance. No surrounding erythema, significant swelling or drainage noted   Neurological:     General: No focal deficit present.     Mental Status: She is alert and oriented to person, place, and time.   Psychiatric:        Behavior: Behavior is cooperative.      UC Treatments / Results  Labs (all labs ordered are listed, but only abnormal results are displayed) Labs Reviewed  AEROBIC CULTURE W GRAM STAIN (SUPERFICIAL SPECIMEN)    EKG   Radiology No results found.  Procedures Incision and Drainage  Date/Time: 11/25/2023 11:36 AM  Performed by: Iola Lukes, FNP Authorized by: Iola Lukes, FNP   Consent:    Consent obtained:  Verbal   Consent given by:  Patient   Risks, benefits, and alternatives were discussed: yes     Risks discussed:  Bleeding, pain and incomplete drainage Universal protocol:    Patient identity confirmed:  Verbally with patient and arm band Location:    Type:  Abscess   Location:  Upper extremity   Upper extremity location: right axilla. Pre-procedure details:    Skin preparation:  Chlorhexidine  with alcohol and povidone-iodine Sedation:    Sedation type:  None Anesthesia:    Anesthesia method:  Local infiltration   Local anesthetic:  Lidocaine  2% w/o epi Procedure type:    Complexity:  Complex Procedure details:    Incision types:  Single straight   Wound management:  Probed and deloculated   Drainage:  Purulent and bloody   Drainage amount:  Moderate   Wound treatment:  Drain placed   Packing materials:  1/4 in iodoform gauze Post-procedure details:    Procedure completion:  Tolerated well, no immediate complications   (including critical care time)  Medications Ordered in UC Medications - No data to display  Initial Impression / Assessment and Plan / UC Course  I have reviewed the triage vital signs and the nursing notes.  Pertinent labs & imaging results that were available during my care of the patient were reviewed by me and considered in my medical decision making (see chart for details).     The patient presents with a 4-day history of a boil in the right axilla, with a history of prior similar abscesses. The patient has diabetes managed with insulin  and a recent A1C of 7.1. No systemic symptoms such as fever, chills, or body aches were reported. The abscess had not drained spontaneously prior to evaluation. Given the clinical  presentation and underlying diabetes, which may slow healing, an incision and drainage procedure was performed and a wound culture obtained. Empiric treatment with Bactrim  DS was started, with plans to adjust based on culture results. Diflucan  was prescribed at the patient's request to prevent yeast infection related to antibiotic use. The patient was educated on the impact of diabetes on wound healing, proper post-procedure care, and the possible link between shaving and boil recurrence. Follow-up in 2 days is recommended for packing removal and wound reassessment. The patient was advised to return to the ED for worsening pain, redness, spreading infection, fever, or signs of systemic illness.  Today's evaluation has revealed no signs of a dangerous process. Discussed diagnosis with patient and/or guardian. Patient and/or guardian aware of their diagnosis, possible red flag symptoms to watch out for and need for close follow up. Patient and/or guardian understands verbal and written discharge instructions. Patient and/or guardian comfortable with plan and disposition.  Patient and/or guardian has a clear mental status at this time, good insight into illness (after discussion and  teaching) and has clear judgment to make decisions regarding their care  Documentation was completed with the aid of voice recognition software. Transcription may contain typographical errors. Final Clinical Impressions(s) / UC Diagnoses   Final diagnoses:  Abscess of axilla, right     Discharge Instructions      You were seen today for an abscess.  An abscess is an infection under the skin that can happen anywhere on the body. If not treated, it can get worse and cause a more serious infection.  Today, we performed a procedure called an I&D, which stands for incision and drainage. This means a small cut was made to let the infection and pus drain out. The area was cleaned thoroughly, and a gauze packing was placed inside the open area to help it heal properly. This packing should stay in place until you return for your follow-up visit. A sample of the drainage was sent to the lab to check for any bacteria.  You have been prescribed antibiotics. Be sure to take them exactly as directed, and always take them with food to help avoid an upset stomach. Keep using warm, moist compresses on the area to help with healing.   Watch the area closely for any signs that the infection is getting worse, such as increased redness, swelling, pain, pus, or fever. If you notice any of these symptoms, contact your doctor, return to urgent care or go to the ED right away.      ED Prescriptions     Medication Sig Dispense Auth. Provider   sulfamethoxazole -trimethoprim  (BACTRIM  DS) 800-160 MG tablet Take 1 tablet by mouth 2 (two) times daily for 10 days. 20 tablet Iola Lukes, FNP   fluconazole  (DIFLUCAN ) 150 MG tablet Take 1 tablet (150 mg total) by mouth every 3 (three) days for 2 doses. 2 tablet Iola Lukes, FNP      PDMP not reviewed this encounter.   Iola Lukes, FNP 11/25/23 1135    Iola Lukes, [place] 11/25/23 1137

## 2023-11-25 NOTE — ED Notes (Signed)
 Escorted to lobby desk to make appt for packing removal

## 2023-11-25 NOTE — ED Triage Notes (Signed)
 Patient here today with c/o abscess on right axilla X 4 days.

## 2023-11-27 ENCOUNTER — Ambulatory Visit (HOSPITAL_COMMUNITY): Payer: Self-pay

## 2023-11-27 ENCOUNTER — Other Ambulatory Visit: Payer: Self-pay

## 2023-11-27 ENCOUNTER — Ambulatory Visit (HOSPITAL_COMMUNITY)

## 2023-11-27 ENCOUNTER — Ambulatory Visit (HOSPITAL_COMMUNITY)
Admission: RE | Admit: 2023-11-27 | Discharge: 2023-11-27 | Disposition: A | Discharge status: Home / Self Care | Source: Ambulatory Visit | Attending: Internal Medicine | Admitting: Internal Medicine

## 2023-11-27 ENCOUNTER — Encounter (HOSPITAL_COMMUNITY): Payer: Self-pay

## 2023-11-27 VITALS — BP 146/98 | HR 105 | Temp 98.3°F | Resp 18

## 2023-11-27 DIAGNOSIS — L02411 Cutaneous abscess of right axilla: Secondary | ICD-10-CM | POA: Diagnosis not present

## 2023-11-27 DIAGNOSIS — Z4800 Encounter for change or removal of nonsurgical wound dressing: Secondary | ICD-10-CM | POA: Diagnosis not present

## 2023-11-27 LAB — AEROBIC CULTURE W GRAM STAIN (SUPERFICIAL SPECIMEN): Culture: NORMAL

## 2023-11-27 MED ORDER — NAPROXEN 500 MG PO TABS
500.0000 mg | ORAL_TABLET | Freq: Two times a day (BID) | ORAL | 0 refills | Status: DC
  Filled 2023-11-27: qty 30, 15d supply, fill #0

## 2023-11-27 MED ORDER — ONDANSETRON 4 MG PO TBDP
4.0000 mg | ORAL_TABLET | Freq: Three times a day (TID) | ORAL | 0 refills | Status: AC | PRN
  Filled 2023-11-27: qty 20, 7d supply, fill #0

## 2023-11-27 NOTE — Discharge Instructions (Addendum)
 We removed your packing today. Take antibiotic as prescribed to treat infection to the abscess.   Change your dressings frequently as the wound heals.   You may take over the counter pain medicines as needed for pain.   If you notice any worsening signs of infection such as redness, swelling, fever, or worsening drainage, please return to urgent care for reevaluation.

## 2023-11-27 NOTE — ED Provider Notes (Signed)
 [facility]    CSN: [id number] Arrival date & time: 11/27/23  1038      History   Chief Complaint Chief Complaint  Patient presents with   Abscess    HPI Sarah Garrison is a 43 y.o. female.   Sarah Garrison is a 43 y.o. female presenting for chief complaint of right axillary abscess packing removal.  She was seen 2 days ago urgent care where abscess to the right axilla was incised and drained, packing was placed.  She was advised to return 2 days later for packing removal.  She remains with significant pain to the right axillary region despite taking ibuprofen  800 mg and is requesting a prescription for naproxen  today.  Naproxen  has worked well in the past for inflammation for her.  She is taking Bactrim  antibiotic as prescribed, however she does report intermittent nausea associated with antibiotic use.  Denies vomiting.  Denies recent fever, chills, body aches, and dizziness.  Abscess is still draining purulent/yellow material.  She is in warm compresses to the axilla with some relief of pain.   Abscess   Past Medical History:  Diagnosis Date   Anxiety    Asthma    Chronic constipation    Chronic shortness of breath    Depression    GERD (gastroesophageal reflux disease)    History of COVID-19 09/29/2019   09-28-2020 result in epic; hospital admission 10-05-2019 for covid pneumonia with hypoxic acute respiratory failure, no oxygen needed when discharged 10-06-2019;  and positive 06-13-2020 result in epic, pt stated no symptoms   History of seizures    (05-04-2021  pt stated was told during hospital admission 05/ 2021 with covid , she had 2 seizures,  stated no medication and no seizure since)   Hypertension    Insulin  dependent type 2 diabetes mellitus [facility])    endocrinologist-- warren batty NP;   pt uses insulin  pump   Insulin  pump in place    Mixed hyperlipidemia    OSA (obstructive sleep apnea)    ( 05-04-2021 pt stated has not used cpap since 05/  2021) followed by dr byrum;  study in epic 02-16-2019 moderate , titrated cpap 08-28-2019   Plantar fasciitis, right    w/ chronic pain    Patient Active Problem List   Diagnosis Date Noted   Acute abdominal pain in left lower quadrant 11/01/2022   Left tennis elbow 08/24/2022   Pain in both knees 08/24/2022   Neurogenic pain of right foot 08/24/2022   Pain of right lower extremity 07/26/2022   Palpitations 07/31/2021   Fatigue 07/31/2021   Primary hypertension 07/31/2021   Hip pain 12/08/2019   Hyperlipidemia associated with type 2 diabetes mellitus (HCC) 11/23/2019   History of COVID-19 10/27/2019   Stuttering 10/27/2019   Depression with anxiety 10/27/2019   Obesity, Class III, BMI 40-49.9 (morbid obesity) 06/30/2019   OSA (obstructive sleep apnea) 02/16/2019   Essential hypertension 06/22/2018   Type 2 diabetes mellitus with hyperglycemia, with long-term current use of insulin  (HCC) 06/22/2018   Type 2 diabetes mellitus with hyperosmolarity without coma, with long-term current use of insulin  (HCC) 12/13/2016   Asthma 07/30/2016   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/07/2016   DM neuropathy, type II diabetes mellitus (HCC) 08/24/2015    Past Surgical History:  Procedure Laterality Date   CESAREAN SECTION  1999   PLANTAR FASCIA RELEASE Right 05/08/2021   Procedure: ENDOSCOPIC PLANTAR FASCIOTOMY;  Surgeon: Burt Fus, DPM;  Location: [facility];  Service:  Podiatry;  Laterality: Right;  WITH BLOCK   STERIOD INJECTION Bilateral 05/08/2021   Procedure: PLATELET RICH PLASMA INJECTION HEELS;  Surgeon: Stover, Titorya, DPM;  Location: [facility];  Service: Podiatry;  Laterality: Bilateral;   TUBAL LIGATION Bilateral 09/29/2006   @[facility] ;   PPTL   VENTRAL HERNIA REPAIR N/A 11/27/2017   Procedure: VENTRAL HERNIA REPAIR ERAS PATHWAY;  Surgeon: Vanderbilt Ned, MD;  Location: [facility];  Service: General;  Laterality: N/A;   WISDOM TOOTH EXTRACTION       OB History     Gravida  5   Para  4   Term      Preterm      AB  1   Living  4      SAB  1   IAB      Ectopic      Multiple      Live Births               Home Medications    Prior to Admission medications   Medication Sig Start Date End Date Taking? Authorizing Provider  naproxen  (NAPROSYN ) 500 MG tablet Take 1 tablet (500 mg total) by mouth 2 (two) times daily. 11/27/23  Yes Enedelia Dorna HERO, FNP  ondansetron  (ZOFRAN -ODT) 4 MG disintegrating tablet Take 1 tablet (4 mg total) by mouth every 8 (eight) hours as needed for nausea or vomiting. 11/27/23  Yes Enedelia Dorna HERO, FNP  albuterol  (VENTOLIN  HFA) 108 (90 Base) MCG/ACT inhaler Inhale 2 puffs into the lungs every 4 (four) hours as needed for wheezing or shortness of breath. 09/13/23   Norleen Lynwood ORN, MD  atorvastatin  (LIPITOR) 40 MG tablet Take 1 tablet (40 mg total) by mouth daily. 08/12/23   Newlin, Enobong, MD  budesonide  (RHINOCORT  ALLERGY) 32 MCG/ACT nasal spray Place 1 spray into both nostrils daily. 11/01/23   Vicci Barnie NOVAK, MD  cetirizine  (ZYRTEC ) 10 MG tablet Take 1 tablet (10 mg total) by mouth daily. 11/01/23   Vicci Barnie NOVAK, MD  Continuous Blood Gluc Sensor (DEXCOM G6 SENSOR) MISC USE AND CHANGE SENSOR EVERY 10 DAYS AS DIRECTED 03/14/22   [provider]  Continuous Blood Gluc Sensor (DEXCOM G6 SENSOR) MISC SMARTSIG:Topical Every 10 Days    [provider]  Continuous Blood Gluc Transmit (DEXCOM G6 TRANSMITTER) MISC USE TO CHECK BLOOD SUGAR EVERY DAY. CHANGE EVERY 3 MONTHS AS DIRECTED 03/14/22   [provider]  fluconazole  (DIFLUCAN ) 150 MG tablet Take 1 tablet (150 mg total) by mouth every 3 (three) days for 2 doses. 11/25/23 12/01/23  Iola Lukes, FNP  insulin  aspart (NOVOLOG  FLEXPEN) 100 UNIT/ML FlexPen Inject into the skin 3 (three) times daily before meals. ONLY WHEN INSULIN  PUMP MALFUNCTION'S PER ENDOCRINOLOGY DIRECTION    [provider]   insulin  aspart (NOVOLOG ) 100 UNIT/ML injection USE VIA INSULIN  PUMP. MAX TOTAL DAILY DOSE OF 300 UNITS 08/06/23     Insulin  Glargine Solostar 300 UNIT/ML SOPN Inject 70 Units into the skin daily. Only if insullin pump malfunction. 11/27/22     Insulin  Human (INSULIN  PUMP) SOLN Inject 1 each into the skin See admin instructions. Medication: Novolog  100 units/ml injection. Takes 100 units via pump per patient.    [provider]  Insulin  Syringe-Needle U-100 31G X 5/16 1 ML MISC inject 10 units into the skin 3 times daily. use to inject novolog  based on insulin  to card ratio- max daily dose 150 units 09/23/20     lisinopril  (ZESTRIL ) 20  MG tablet Take 1 tablet by mouth daily. 08/06/23   [provider]  NOVOFINE PEN NEEDLE 32G X 6 MM MISC Use to administer insulin  daily 10/23/23   [provider]  olopatadine  (PATANOL) 0.1 % ophthalmic solution Place 1 drop into both eyes 2 (two) times daily. 08/08/23   Newlin, Enobong, MD  OZEMPIC , 1 MG/DOSE, 4 MG/3ML SOPN Inject 1 mg into the skin. 10/17/23   [provider]  pantoprazole  (PROTONIX ) 40 MG tablet Take 1 tablet (40 mg total) by mouth daily. 10/23/22   Newlin, Enobong, MD  sertraline  (ZOLOFT ) 100 MG tablet Take 1 tablet (100 mg total) by mouth daily. 09/13/23   Norleen Lynwood ORN, MD  SERTRALINE  HCL PO Take by mouth.    [provider]  sulfamethoxazole -trimethoprim  (BACTRIM  DS) 800-160 MG tablet Take 1 tablet by mouth 2 (two) times daily for 10 days. 11/25/23 12/05/23  Murrill, Samantha, FNP  ARIPiprazole  (ABILIFY ) 5 MG tablet Take 1 tablet (5 mg total) by mouth daily. 10/27/19 06/13/20  Sater, Charlie LABOR, MD    Family History Family History  Problem Relation Age of Onset   Asthma Mother    Kidney failure Mother    Brain cancer Mother    Asthma Father    Other Father        surgery on stomach but don't know from what   Breast cancer Maternal Grandmother    Diabetes Maternal Grandmother    Diabetes Brother      Social History Social History   Tobacco Use   Smoking status: Former    Current packs/day: 0.00    Average packs/day: 0.1 packs/day for 2.0 years (0.2 ttl pk-yrs)    Types: Cigarettes, E-cigarettes    Start date: 11/28/2015    Quit date: 11/27/2017    Years since quitting: 6.0   Smokeless tobacco: Never   Tobacco comments:    I vape every now and then.  Vaping Use   Vaping status: Former   Devices: Elfvar  (05-04-2021 per pt last vaped approx 2 wks ago)  Substance Use Topics   Alcohol use: No   Drug use: Never     Allergies   Morphine  and codeine , Glyburide , Hydrocodone , Ivp dye [iodinated contrast media], Mupirocin , and Oxycodone    Review of Systems Review of Systems Per HPI  Physical Exam Triage Vital Signs ED Triage Vitals  Encounter Vitals Group     BP 11/27/23 1113 (!) 146/98     Girls Systolic BP Percentile --      Girls Diastolic BP Percentile --      Boys Systolic BP Percentile --      Boys Diastolic BP Percentile --      Pulse Rate 11/27/23 1113 (!) 105     Resp 11/27/23 1113 18     Temp 11/27/23 1113 98.3 F (36.8 C)     Temp src --      SpO2 11/27/23 1113 96 %     Weight --      Height --      Head Circumference --      Peak Flow --      Pain Score 11/27/23 1112 5     Pain Loc --      Pain Education --      Exclude from Growth Chart --    No data found.  Updated Vital Signs BP (!) 146/98   Pulse (!) 105   Temp 98.3 F (36.8 C)   Resp 18   LMP  (  LMP Unknown)   SpO2 96%   Visual Acuity Right Eye Distance:   Left Eye Distance:   Bilateral Distance:    Right Eye Near:   Left Eye Near:    Bilateral Near:     Physical Exam Vitals and nursing note reviewed.  Constitutional:      Appearance: She is not ill-appearing or toxic-appearing.  HENT:     Head: Normocephalic and atraumatic.     Right Ear: Hearing and external ear normal.     Left Ear: Hearing and external ear normal.     Nose: Nose normal.     Mouth/Throat:     Lips:  Pink.   Eyes:     General: Lids are normal. Vision grossly intact. Gaze aligned appropriately.     Extraocular Movements: Extraocular movements intact.     Conjunctiva/sclera: Conjunctivae normal.   Pulmonary:     Effort: Pulmonary effort is normal.   Musculoskeletal:     Cervical back: Neck supple.   Skin:    General: Skin is warm and dry.     Capillary Refill: Capillary refill takes less than 2 seconds.     Findings: Abscess present. No rash.       Neurological:     General: No focal deficit present.     Mental Status: She is alert and oriented to person, place, and time. Mental status is at baseline.     Cranial Nerves: No dysarthria or facial asymmetry.   Psychiatric:        Mood and Affect: Mood normal.        Speech: Speech normal.        Behavior: Behavior normal.        Thought Content: Thought content normal.        Judgment: Judgment normal.      UC Treatments / Results  Labs (all labs ordered are listed, but only abnormal results are displayed) Labs Reviewed - No data to display  EKG   Radiology No results found.  Procedures Procedures (including critical care time)  Medications Ordered in UC Medications - No data to display  Initial Impression / Assessment and Plan / UC Course  I have reviewed the triage vital signs and the nursing notes.  Pertinent labs & imaging results that were available during my care of the patient were reviewed by me and considered in my medical decision making (see chart for details).   1.  Abscess packing removal, abscess of right axilla Packing removed from abscess.  No clinical indication to replace this today.  Continue warm compresses to promote further drainage.  Naproxen  500 mg twice daily as needed for pain and swelling instead of ibuprofen  800 mg.  Advised to take this with food to avoid stomach upset. Continue taking antibiotic as prescribed. Zofran  4 mg ODT every 8 hours as needed for nausea/vomiting associate  with antibiotic use. Infection return precautions discussed.  Wound cleansed and dressed in clinic with nonstick gauze and tape.  Counseled patient on potential for adverse effects with medications prescribed/recommended today, strict ER and return-to-clinic precautions discussed, patient verbalized understanding.    Final Clinical Impressions(s) / UC Diagnoses   Final diagnoses:  Abscess packing removal  Abscess of right axilla     Discharge Instructions      We removed your packing today. Take antibiotic as prescribed to treat infection to the abscess.   Change your dressings frequently as the wound heals.   You may take over the counter pain  medicines as needed for pain.   If you notice any worsening signs of infection such as redness, swelling, fever, or worsening drainage, please return to urgent care for reevaluation.     ED Prescriptions     Medication Sig Dispense Auth. Provider   naproxen  (NAPROSYN ) 500 MG tablet Take 1 tablet (500 mg total) by mouth 2 (two) times daily. 30 tablet Enedelia Dorna HERO, FNP   ondansetron  (ZOFRAN -ODT) 4 MG disintegrating tablet Take 1 tablet (4 mg total) by mouth every 8 (eight) hours as needed for nausea or vomiting. 20 tablet Enedelia Dorna HERO, FNP      PDMP not reviewed this encounter.   Enedelia Dorna HERO, [place] 11/27/23 1207

## 2023-11-27 NOTE — ED Triage Notes (Signed)
 Pt is here for packing removal. She was seen two days ago for an abscess on her right under arm. No new complaints.

## 2023-12-02 ENCOUNTER — Ambulatory Visit: Attending: Family Medicine

## 2023-12-02 DIAGNOSIS — Z111 Encounter for screening for respiratory tuberculosis: Secondary | ICD-10-CM

## 2023-12-02 NOTE — Progress Notes (Signed)
 PPD Placement note DAWNETTE MIONE, 43 y.o. female is here today for placement of PPD test Reason for PPD test: Work Pt taken PPD test before: yes   P:  PPD placed on 12/02/2023.  Patient advised to return for reading within 48-72 hours.

## 2023-12-04 ENCOUNTER — Other Ambulatory Visit: Payer: Self-pay

## 2023-12-04 ENCOUNTER — Ambulatory Visit: Payer: Self-pay | Attending: Family Medicine

## 2023-12-04 LAB — TB SKIN TEST
Induration: NEGATIVE mm
TB Skin Test: NEGATIVE

## 2023-12-04 NOTE — Progress Notes (Signed)
 PPD Reading Note  PPD read and results entered in [employer].  Result: 0 mm induration.  Interpretation: negative

## 2023-12-09 ENCOUNTER — Other Ambulatory Visit: Payer: Self-pay

## 2023-12-09 MED ORDER — INSULIN ASPART 100 UNIT/ML IJ SOLN
INTRAMUSCULAR | 5 refills | Status: AC
  Filled 2024-04-10: qty 40, 13d supply, fill #0
  Filled 2024-04-10: qty 50, 17d supply, fill #0
  Filled 2024-04-10: qty 90, 30d supply, fill #0

## 2023-12-09 MED ORDER — OZEMPIC (1 MG/DOSE) 4 MG/3ML ~~LOC~~ SOPN
1.0000 mg | PEN_INJECTOR | SUBCUTANEOUS | 8 refills | Status: DC
  Filled 2023-12-09 – 2023-12-26 (×2): qty 3, 28d supply, fill #0
  Filled 2024-01-20: qty 3, 28d supply, fill #1
  Filled 2024-02-14 (×2): qty 3, 28d supply, fill #2
  Filled 2024-03-27: qty 3, 28d supply, fill #3

## 2023-12-09 MED ORDER — DEXCOM G7 SENSOR MISC
4 refills | Status: AC
  Filled 2023-12-09 – 2023-12-26 (×2): qty 3, 30d supply, fill #0
  Filled 2024-01-20: qty 3, 30d supply, fill #1
  Filled 2024-02-14 (×2): qty 3, 30d supply, fill #2
  Filled 2024-03-16: qty 3, 30d supply, fill #3
  Filled 2024-04-10: qty 3, 30d supply, fill #4
  Filled 2024-05-14: qty 3, 30d supply, fill #5

## 2023-12-18 ENCOUNTER — Other Ambulatory Visit: Payer: Self-pay

## 2023-12-26 ENCOUNTER — Other Ambulatory Visit: Payer: Self-pay | Admitting: Family Medicine

## 2023-12-26 ENCOUNTER — Other Ambulatory Visit: Payer: Self-pay

## 2023-12-26 MED ORDER — PANTOPRAZOLE SODIUM 40 MG PO TBEC
40.0000 mg | DELAYED_RELEASE_TABLET | Freq: Every day | ORAL | 3 refills | Status: AC
  Filled 2023-12-26: qty 30, 30d supply, fill #0
  Filled 2024-01-23: qty 30, 30d supply, fill #1
  Filled 2024-03-27 – 2024-05-25 (×2): qty 30, 30d supply, fill #2

## 2023-12-30 ENCOUNTER — Ambulatory Visit (HOSPITAL_COMMUNITY): Admitting: Clinical

## 2023-12-31 NOTE — Procedures (Signed)
Mask fit

## 2024-01-03 ENCOUNTER — Other Ambulatory Visit: Payer: Self-pay

## 2024-01-20 ENCOUNTER — Other Ambulatory Visit: Payer: Self-pay

## 2024-01-23 ENCOUNTER — Ambulatory Visit: Attending: Family Medicine

## 2024-01-23 ENCOUNTER — Other Ambulatory Visit: Payer: Self-pay

## 2024-01-23 DIAGNOSIS — Z3042 Encounter for surveillance of injectable contraceptive: Secondary | ICD-10-CM

## 2024-01-23 MED ORDER — MEDROXYPROGESTERONE ACETATE 150 MG/ML IM SUSP
150.0000 mg | Freq: Once | INTRAMUSCULAR | Status: AC
  Administered 2024-01-23: 150 mg via INTRAMUSCULAR

## 2024-02-04 ENCOUNTER — Other Ambulatory Visit: Payer: Self-pay

## 2024-02-04 ENCOUNTER — Ambulatory Visit (HOSPITAL_COMMUNITY)
Admission: RE | Admit: 2024-02-04 | Discharge: 2024-02-04 | Disposition: A | Discharge status: Home / Self Care | Source: Ambulatory Visit

## 2024-02-04 ENCOUNTER — Encounter (HOSPITAL_COMMUNITY): Payer: Self-pay

## 2024-02-04 VITALS — BP 142/94 | HR 103 | Temp 98.2°F | Resp 16

## 2024-02-04 DIAGNOSIS — J4521 Mild intermittent asthma with (acute) exacerbation: Secondary | ICD-10-CM | POA: Diagnosis not present

## 2024-02-04 MED ORDER — ALBUTEROL SULFATE (2.5 MG/3ML) 0.083% IN NEBU
2.5000 mg | INHALATION_SOLUTION | Freq: Four times a day (QID) | RESPIRATORY_TRACT | 12 refills | Status: AC | PRN
  Filled 2024-02-04: qty 75, 7d supply, fill #0

## 2024-02-04 MED ORDER — IPRATROPIUM-ALBUTEROL 0.5-2.5 (3) MG/3ML IN SOLN
3.0000 mL | Freq: Four times a day (QID) | RESPIRATORY_TRACT | 1 refills | Status: AC | PRN
  Filled 2024-02-04: qty 90, 8d supply, fill #0

## 2024-02-04 NOTE — ED Triage Notes (Signed)
 Patient here today with c/o wheeze and SOB that started this morning. Patient states that she had some coughing yesterday. Denies any other symptoms. Patient has h/o asthma and uses an albuterol  inhaler with no relief.

## 2024-02-04 NOTE — ED Provider Notes (Signed)
 [facility] [facility]  Note:  This document was prepared using Dragon voice recognition software and may include unintentional dictation errors.  MRN: [id number] DOB: 1980/12/03  Subjective:   Sarah Garrison is a 43 y.o. female presenting for wheezing, intermittent shortness of breath that started this morning.  Patient reports some coughing yesterday but denies any other symptoms.  Patient states that this morning when she woke up she was having severe wheezing used her albuterol  inhaler with minimal improvement.  Patient has past history of asthma usually well-controlled with asthma inhaler.  Denies any chest pain, weakness, dizziness, shortness of breath at this time.  Patient states she would like to avoid using steroids if possible because it messes up her blood glucose levels.  No current facility-administered medications for this encounter.  Current Outpatient Medications:    albuterol  (PROVENTIL ) (2.5 MG/3ML) 0.083% nebulizer solution, Take 3 mLs (2.5 mg total) by nebulization every 6 (six) hours as needed for wheezing or shortness of breath., Disp: 75 mL, Rfl: 12   ipratropium-albuterol  (DUONEB) 0.5-2.5 (3) MG/3ML SOLN, Take 3 mLs by nebulization every 6 (six) hours as needed., Disp: 36 mL, Rfl: 1   albuterol  (VENTOLIN  HFA) 108 (90 Base) MCG/ACT inhaler, Inhale 2 puffs into the lungs every 4 (four) hours as needed for wheezing or shortness of breath., Disp: 18 g, Rfl: 5   atorvastatin  (LIPITOR) 40 MG tablet, Take 1 tablet (40 mg total) by mouth daily., Disp: 90 tablet, Rfl: 2   budesonide  (RHINOCORT  ALLERGY) 32 MCG/ACT nasal spray, Place 1 spray into both nostrils daily., Disp: 8.43 mL, Rfl: 1   cetirizine  (ZYRTEC ) 10 MG tablet, Take 1 tablet (10 mg total) by mouth daily., Disp: 30 tablet, Rfl: 3   Continuous Blood Gluc Sensor (DEXCOM G6 SENSOR) MISC, USE AND CHANGE SENSOR EVERY 10 DAYS AS DIRECTED, Disp: , Rfl:    Continuous Blood Gluc Sensor (DEXCOM G6 SENSOR) MISC,  SMARTSIG:Topical Every 10 Days, Disp: , Rfl:    Continuous Blood Gluc Transmit (DEXCOM G6 TRANSMITTER) MISC, USE TO CHECK BLOOD SUGAR EVERY DAY. CHANGE EVERY 3 MONTHS AS DIRECTED, Disp: , Rfl:    Continuous Glucose Sensor (DEXCOM G7 SENSOR) MISC, Change every 10 days., Disp: 9 each, Rfl: 4   insulin  aspart (NOVOLOG  FLEXPEN) 100 UNIT/ML FlexPen, Inject into the skin 3 (three) times daily before meals. ONLY WHEN INSULIN  PUMP MALFUNCTION'S PER ENDOCRINOLOGY DIRECTION, Disp: , Rfl:    insulin  aspart (NOVOLOG ) 100 UNIT/ML injection, USE VIA INSULIN  PUMP. MAX TOTAL DAILY DOSE OF 300 UNITS, Disp: 90 mL, Rfl: 5   insulin  aspart (NOVOLOG ) 100 UNIT/ML injection, USE VIA INSULIN  PUMP. MAX TOTAL DAILY DOSE OF 300 UNITS, Disp: 90 mL, Rfl: 5   Insulin  Glargine Solostar 300 UNIT/ML SOPN, Inject 70 Units into the skin daily. Only if insullin pump malfunction., Disp: 9 mL, Rfl: 1   Insulin  Human (INSULIN  PUMP) SOLN, Inject 1 each into the skin See admin instructions. Medication: Novolog  100 units/ml injection. Takes 100 units via pump per patient., Disp: , Rfl:    Insulin  Syringe-Needle U-100 31G X 5/16 1 ML MISC, inject 10 units into the skin 3 times daily. use to inject novolog  based on insulin  to card ratio- max daily dose 150 units, Disp: 100 each, Rfl: 2   lisinopril  (ZESTRIL ) 20 MG tablet, Take 1 tablet by mouth daily., Disp: , Rfl:    naproxen  (NAPROSYN ) 500 MG tablet, Take 1 tablet (500 mg total) by mouth 2 (two) times daily., Disp: 30 tablet, Rfl: 0  NOVOFINE PEN NEEDLE 32G X 6 MM MISC, Use to administer insulin  daily, Disp: , Rfl:    olopatadine  (PATANOL) 0.1 % ophthalmic solution, Place 1 drop into both eyes 2 (two) times daily., Disp: 5 mL, Rfl: 2   ondansetron  (ZOFRAN -ODT) 4 MG disintegrating tablet, Take 1 tablet (4 mg total) by mouth every 8 (eight) hours as needed for nausea or vomiting., Disp: 20 tablet, Rfl: 0   OZEMPIC , 1 MG/DOSE, 4 MG/3ML SOPN, Inject 1 mg into the skin., Disp: , Rfl:     pantoprazole  (PROTONIX ) 40 MG tablet, Take 1 tablet (40 mg total) by mouth daily., Disp: 30 tablet, Rfl: 3   Semaglutide , 1 MG/DOSE, (OZEMPIC , 1 MG/DOSE,) 4 MG/3ML SOPN, Inject 1 mg into the skin once a week., Disp: 3 mL, Rfl: 8   sertraline  (ZOLOFT ) 100 MG tablet, Take 1 tablet (100 mg total) by mouth daily., Disp: 30 tablet, Rfl: 3   Allergies  Allergen Reactions   Morphine  And Codeine  Shortness Of Breath   Glyburide  Diarrhea and Other (See Comments)    Reaction:  Nose bleeds    Hydrocodone  Other (See Comments)    Pt states that this medication makes her dream about rabbits chasing her   Ivp Dye [Iodinated Contrast Media] Other (See Comments)    Shortness of breath.     Mupirocin  Diarrhea   Oxycodone  Other (See Comments)    Pt states that this medication makes her dream about rabbits chasing her.      Past Medical History:  Diagnosis Date   Anxiety    Asthma    Chronic constipation    Chronic shortness of breath    Depression    GERD (gastroesophageal reflux disease)    History of COVID-19 09/29/2019   09-28-2020 result in epic; hospital admission 10-05-2019 for covid pneumonia with hypoxic acute respiratory failure, no oxygen needed when discharged 10-06-2019;  and positive 06-13-2020 result in epic, pt stated no symptoms   History of seizures    (05-04-2021  pt stated was told during hospital admission 05/ 2021 with covid , she had 2 seizures,  stated no medication and no seizure since)   Hypertension    Insulin  dependent type 2 diabetes mellitus [facility])    endocrinologist-- warren batty NP;   pt uses insulin  pump   Insulin  pump in place    Mixed hyperlipidemia    OSA (obstructive sleep apnea)    ( 05-04-2021 pt stated has not used cpap since 05/ 2021) followed by dr byrum;  study in epic 02-16-2019 moderate , titrated cpap 08-28-2019   Plantar fasciitis, right    w/ chronic pain     Past Surgical History:  Procedure Laterality Date   CESAREAN SECTION  1999    PLANTAR FASCIA RELEASE Right 05/08/2021   Procedure: ENDOSCOPIC PLANTAR FASCIOTOMY;  Surgeon: Burt Fus, DPM;  Location: [facility];  Service: Podiatry;  Laterality: Right;  WITH BLOCK   STERIOD INJECTION Bilateral 05/08/2021   Procedure: PLATELET RICH PLASMA INJECTION HEELS;  Surgeon: Stover, Titorya, DPM;  Location: [facility];  Service: Podiatry;  Laterality: Bilateral;   TUBAL LIGATION Bilateral 09/29/2006   @[facility] ;   PPTL   VENTRAL HERNIA REPAIR N/A 11/27/2017   Procedure: VENTRAL HERNIA REPAIR ERAS PATHWAY;  Surgeon: Vanderbilt Ned, MD;  Location: [facility];  Service: General;  Laterality: N/A;   WISDOM TOOTH EXTRACTION      Family History  Problem Relation Age of Onset   Asthma Mother  Kidney failure Mother    Brain cancer Mother    Asthma Father    Other Father        surgery on stomach but don't know from what   Breast cancer Maternal Grandmother    Diabetes Maternal Grandmother    Diabetes Brother     Social History   Tobacco Use   Smoking status: Former    Current packs/day: 0.00    Average packs/day: 0.1 packs/day for 2.0 years (0.2 ttl pk-yrs)    Types: Cigarettes, E-cigarettes    Start date: 11/28/2015    Quit date: 11/27/2017    Years since quitting: 6.1   Smokeless tobacco: Never   Tobacco comments:    I vape every now and then.  Vaping Use   Vaping status: Former   Devices: Elfvar  (05-04-2021 per pt last vaped approx 2 wks ago)  Substance Use Topics   Alcohol use: No   Drug use: Never    ROS Refer to HPI for ROS details.  Objective:   Vitals: BP (!) 142/94 (BP Location: Left Arm)   Pulse (!) 103   Temp 98.2 F (36.8 C) (Oral)   Resp 16   LMP  (LMP Unknown)   SpO2 95%   Physical Exam Vitals and nursing note reviewed.  Constitutional:      General: She is not in acute distress.    Appearance: Normal appearance. She is well-developed. She is not ill-appearing or toxic-appearing.  HENT:      Head: Normocephalic and atraumatic.     Nose: Nose normal. No congestion or rhinorrhea.     Mouth/Throat:     Mouth: Mucous membranes are moist.  Cardiovascular:     Rate and Rhythm: Regular rhythm. Tachycardia present.     Heart sounds: Normal heart sounds. No murmur heard. Pulmonary:     Effort: Pulmonary effort is normal. No respiratory distress.     Breath sounds: No stridor. Wheezing present. No rhonchi or rales.  Chest:     Chest wall: No tenderness.  Abdominal:     Palpations: Abdomen is soft.     Tenderness: There is no abdominal tenderness. There is no right CVA tenderness or left CVA tenderness.  Skin:    General: Skin is warm and dry.  Neurological:     General: No focal deficit present.     Mental Status: She is alert and oriented to person, place, and time.  Psychiatric:        Mood and Affect: Mood normal.        Behavior: Behavior normal.     Procedures  No results found for this or any previous visit (from the past 24 hours).  No results found.   Assessment and Plan :     Discharge Instructions       1. Mild intermittent asthma with exacerbation (Primary) - ipratropium-albuterol  (DUONEB) 0.5-2.5 (3) MG/3ML SOLN; Take 3 mLs by nebulization every 6 (six) hours as needed.  Dispense: 36 mL; Refill: 1 - albuterol  (PROVENTIL ) (2.5 MG/3ML) 0.083% nebulizer solution; Take 3 mLs (2.5 mg total) by nebulization every 6 (six) hours as needed for wheezing or shortness of breath.  Dispense: 75 mL; Refill: 12 - Home nebulizer machine provided in urgent care for home use. -Continue to monitor symptoms for any change in severity if there is any escalation of current symptoms or development of new symptoms follow-up in ER for further evaluation and management.      Gizella Belleville B Yavonne Kiss   [place],  Zadia Uhde B, NP 02/04/24 1528

## 2024-02-04 NOTE — Discharge Instructions (Signed)
  1. Mild intermittent asthma with exacerbation (Primary) - ipratropium-albuterol  (DUONEB) 0.5-2.5 (3) MG/3ML SOLN; Take 3 mLs by nebulization every 6 (six) hours as needed.  Dispense: 36 mL; Refill: 1 - albuterol  (PROVENTIL ) (2.5 MG/3ML) 0.083% nebulizer solution; Take 3 mLs (2.5 mg total) by nebulization every 6 (six) hours as needed for wheezing or shortness of breath.  Dispense: 75 mL; Refill: 12 - Home nebulizer machine provided in urgent care for home use. -Continue to monitor symptoms for any change in severity if there is any escalation of current symptoms or development of new symptoms follow-up in ER for further evaluation and management.

## 2024-02-05 ENCOUNTER — Ambulatory Visit: Admitting: Podiatry

## 2024-02-05 DIAGNOSIS — M216X1 Other acquired deformities of right foot: Secondary | ICD-10-CM | POA: Diagnosis not present

## 2024-02-05 DIAGNOSIS — M216X2 Other acquired deformities of left foot: Secondary | ICD-10-CM | POA: Diagnosis not present

## 2024-02-05 NOTE — Progress Notes (Signed)
 Subjective:  Patient ID: Sarah Garrison, female    DOB: [birth year],  MRN: [id number]  Chief Complaint  Patient presents with   Toe Pain    Pt stated that she has a numbness in her toe when she steps down it feels like something is shocking her     43 y.o. female presents with the above complaint.  Patient presents for bilateral flatfoot deformity with arch and heel pain.  She went to get it evaluated has not seen anyone else prior to seeing me denies any other acute complaints pain scale 7 out of 10 dull aching nature she would like to discuss treatment options for this she currently does not wear any kind of orthotics.   Review of Systems: Negative except as noted in the HPI. Denies N/V/F/Ch.  Past Medical History:  Diagnosis Date   Anxiety    Asthma    Chronic constipation    Chronic shortness of breath    Depression    GERD (gastroesophageal reflux disease)    History of COVID-19 09/29/2019   09-28-2020 result in epic; hospital admission 10-05-2019 for covid pneumonia with hypoxic acute respiratory failure, no oxygen needed when discharged 10-06-2019;  and positive 06-13-2020 result in epic, pt stated no symptoms   History of seizures    (05-04-2021  pt stated was told during hospital admission 05/ 2021 with covid , she had 2 seizures,  stated no medication and no seizure since)   Hypertension    Insulin  dependent type 2 diabetes mellitus [facility])    endocrinologist-- warren batty NP;   pt uses insulin  pump   Insulin  pump in place    Mixed hyperlipidemia    OSA (obstructive sleep apnea)    ( 05-04-2021 pt stated has not used cpap since 05/ 2021) followed by dr byrum;  study in epic 02-16-2019 moderate , titrated cpap 08-28-2019   Plantar fasciitis, right    w/ chronic pain    Current Outpatient Medications:    albuterol  (PROVENTIL ) (2.5 MG/3ML) 0.083% nebulizer solution, Take 3 mLs (2.5 mg total) by nebulization every 6 (six) hours as needed for wheezing or shortness of  breath., Disp: 75 mL, Rfl: 12   albuterol  (VENTOLIN  HFA) 108 (90 Base) MCG/ACT inhaler, Inhale 2 puffs into the lungs every 4 (four) hours as needed for wheezing or shortness of breath., Disp: 18 g, Rfl: 5   atorvastatin  (LIPITOR) 40 MG tablet, Take 1 tablet (40 mg total) by mouth daily., Disp: 90 tablet, Rfl: 2   budesonide  (RHINOCORT  ALLERGY) 32 MCG/ACT nasal spray, Place 1 spray into both nostrils daily., Disp: 8.43 mL, Rfl: 1   cetirizine  (ZYRTEC ) 10 MG tablet, Take 1 tablet (10 mg total) by mouth daily., Disp: 30 tablet, Rfl: 3   Continuous Blood Gluc Sensor (DEXCOM G6 SENSOR) MISC, USE AND CHANGE SENSOR EVERY 10 DAYS AS DIRECTED, Disp: , Rfl:    Continuous Blood Gluc Sensor (DEXCOM G6 SENSOR) MISC, SMARTSIG:Topical Every 10 Days, Disp: , Rfl:    Continuous Blood Gluc Transmit (DEXCOM G6 TRANSMITTER) MISC, USE TO CHECK BLOOD SUGAR EVERY DAY. CHANGE EVERY 3 MONTHS AS DIRECTED, Disp: , Rfl:    Continuous Glucose Sensor (DEXCOM G7 SENSOR) MISC, Change every 10 days., Disp: 9 each, Rfl: 4   insulin  aspart (NOVOLOG  FLEXPEN) 100 UNIT/ML FlexPen, Inject into the skin 3 (three) times daily before meals. ONLY WHEN INSULIN  PUMP MALFUNCTION'S PER ENDOCRINOLOGY DIRECTION, Disp: , Rfl:    insulin  aspart (NOVOLOG ) 100 UNIT/ML injection, USE VIA INSULIN  PUMP. MAX  TOTAL DAILY DOSE OF 300 UNITS, Disp: 90 mL, Rfl: 5   insulin  aspart (NOVOLOG ) 100 UNIT/ML injection, USE VIA INSULIN  PUMP. MAX TOTAL DAILY DOSE OF 300 UNITS, Disp: 90 mL, Rfl: 5   Insulin  Glargine Solostar 300 UNIT/ML SOPN, Inject 70 Units into the skin daily. Only if insullin pump malfunction., Disp: 9 mL, Rfl: 1   Insulin  Human (INSULIN  PUMP) SOLN, Inject 1 each into the skin See admin instructions. Medication: Novolog  100 units/ml injection. Takes 100 units via pump per patient., Disp: , Rfl:    Insulin  Syringe-Needle U-100 31G X 5/16 1 ML MISC, inject 10 units into the skin 3 times daily. use to inject novolog  based on insulin  to card ratio- max  daily dose 150 units, Disp: 100 each, Rfl: 2   ipratropium-albuterol  (DUONEB) 0.5-2.5 (3) MG/3ML SOLN, Take 3 mLs by nebulization every 6 (six) hours as needed., Disp: 90 mL, Rfl: 1   lisinopril  (ZESTRIL ) 20 MG tablet, Take 1 tablet by mouth daily., Disp: , Rfl:    naproxen  (NAPROSYN ) 500 MG tablet, Take 1 tablet (500 mg total) by mouth 2 (two) times daily., Disp: 30 tablet, Rfl: 0   NOVOFINE PEN NEEDLE 32G X 6 MM MISC, Use to administer insulin  daily, Disp: , Rfl:    olopatadine  (PATANOL) 0.1 % ophthalmic solution, Place 1 drop into both eyes 2 (two) times daily., Disp: 5 mL, Rfl: 2   ondansetron  (ZOFRAN -ODT) 4 MG disintegrating tablet, Take 1 tablet (4 mg total) by mouth every 8 (eight) hours as needed for nausea or vomiting., Disp: 20 tablet, Rfl: 0   OZEMPIC , 1 MG/DOSE, 4 MG/3ML SOPN, Inject 1 mg into the skin., Disp: , Rfl:    pantoprazole  (PROTONIX ) 40 MG tablet, Take 1 tablet (40 mg total) by mouth daily., Disp: 30 tablet, Rfl: 3   Semaglutide , 1 MG/DOSE, (OZEMPIC , 1 MG/DOSE,) 4 MG/3ML SOPN, Inject 1 mg into the skin once a week., Disp: 3 mL, Rfl: 8   sertraline  (ZOLOFT ) 100 MG tablet, Take 1 tablet (100 mg total) by mouth daily., Disp: 30 tablet, Rfl: 3  Social History   Tobacco Use  Smoking Status Former   Current packs/day: 0.00   Average packs/day: 0.1 packs/day for 2.0 years (0.2 ttl pk-yrs)   Types: Cigarettes, E-cigarettes   Start date: 11/28/2015   Quit date: 11/27/2017   Years since quitting: 6.1  Smokeless Tobacco Never  Tobacco Comments   I vape every now and then.    Allergies  Allergen Reactions   Morphine  And Codeine  Shortness Of Breath   Glyburide  Diarrhea and Other (See Comments)    Reaction:  Nose bleeds    Hydrocodone  Other (See Comments)    Pt states that this medication makes her dream about rabbits chasing her   Ivp Dye [Iodinated Contrast Media] Other (See Comments)    Shortness of breath.     Mupirocin  Diarrhea   Oxycodone  Other (See Comments)    Pt  states that this medication makes her dream about rabbits chasing her.     Objective:  There were no vitals filed for this visit. There is no height or weight on file to calculate BMI. Constitutional Well developed. Well nourished.  Vascular Dorsalis pedis pulses palpable bilaterally. Posterior tibial pulses palpable bilaterally. Capillary refill normal to all digits.  No cyanosis or clubbing noted. Pedal hair growth normal.  Neurologic Normal speech. Oriented to person, place, and time. Epicritic sensation to light touch grossly present bilaterally.  Dermatologic Nails well groomed and normal in  appearance. No open wounds. No skin lesions.  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  The arch with dorsiflexion of the hallux unable to perform single heel raise.   Radiographs: None Assessment:   1. Other acquired deformities of left foot   2. Other acquired deformities of right foot    Plan:  Patient was evaluated and treated and all questions answered.  Bilateral foot deformity/flatfoot deformity - All questions and concerns were discussed with the patient in extensive detail -I explained to patient the etiology of pes planovalgus and relationship with Planter fasciitis and various treatment options were discussed.  Given patient foot structure in the setting of Planter fasciitis I believe patient will benefit from custom-made orthotics to help control the hindfoot motion support the arch of the foot and take the stress away from plantar fascial.  Patient agrees with the plan like to proceed with orthotics -Patient was casted for orthotics   No follow-ups on file.

## 2024-02-14 ENCOUNTER — Other Ambulatory Visit: Payer: Self-pay

## 2024-02-14 ENCOUNTER — Other Ambulatory Visit (HOSPITAL_BASED_OUTPATIENT_CLINIC_OR_DEPARTMENT_OTHER): Payer: Self-pay

## 2024-02-17 ENCOUNTER — Other Ambulatory Visit: Payer: Self-pay

## 2024-02-17 ENCOUNTER — Ambulatory Visit (HOSPITAL_COMMUNITY)
Admission: RE | Admit: 2024-02-17 | Discharge: 2024-02-17 | Disposition: A | Discharge status: Home / Self Care | Source: Ambulatory Visit | Attending: Emergency Medicine | Admitting: Emergency Medicine

## 2024-02-17 ENCOUNTER — Ambulatory Visit (INDEPENDENT_AMBULATORY_CARE_PROVIDER_SITE_OTHER)

## 2024-02-17 VITALS — BP 138/84 | HR 88 | Temp 97.9°F | Resp 20

## 2024-02-17 DIAGNOSIS — R0602 Shortness of breath: Secondary | ICD-10-CM

## 2024-02-17 DIAGNOSIS — R519 Headache, unspecified: Secondary | ICD-10-CM | POA: Diagnosis not present

## 2024-02-17 DIAGNOSIS — R051 Acute cough: Secondary | ICD-10-CM | POA: Diagnosis not present

## 2024-02-17 DIAGNOSIS — R062 Wheezing: Secondary | ICD-10-CM

## 2024-02-17 MED ORDER — AZITHROMYCIN 250 MG PO TABS
ORAL_TABLET | ORAL | 0 refills | Status: DC
  Filled 2024-02-17: qty 6, 5d supply, fill #0

## 2024-02-17 MED ORDER — ACETAMINOPHEN 325 MG PO TABS
975.0000 mg | ORAL_TABLET | Freq: Once | ORAL | Status: AC
  Administered 2024-02-17: 975 mg via ORAL

## 2024-02-17 MED ORDER — ACETAMINOPHEN 325 MG PO TABS
ORAL_TABLET | ORAL | Status: AC
Start: 2024-02-17 — End: 2024-02-17
  Filled 2024-02-17: qty 3

## 2024-02-17 NOTE — ED Triage Notes (Signed)
 PT reports she has been using the Neb machine she received last week when seen. Pt is still wheezing and SHOB

## 2024-02-17 NOTE — ED Provider Notes (Signed)
 [facility]    CSN: [id number] Arrival date & time: 02/17/24  0950      History   Chief Complaint Chief Complaint  Patient presents with   Wheezing    Still shortness off breathe pulling at my chest - Entered by patient   Shortness of Breath    HPI Sarah Garrison is a 43 y.o. female.  Here with shortness of breath and wheezing Slight cough for the last day or so Having headache today No chest pain or tightness No fever Was here on 9/2 for same, continued symptoms  She was advised nebulizer treatments q6 hours, has been doing this without relief. Last was this morning  Asthma history, chronic shortness of breath on chart review Used to follow with pulmonology T2 DM, HTN  Dexcom reading 128 currently   Past Medical History:  Diagnosis Date   Anxiety    Asthma    Chronic constipation    Chronic shortness of breath    Depression    GERD (gastroesophageal reflux disease)    History of COVID-19 09/29/2019   09-28-2020 result in epic; hospital admission 10-05-2019 for covid pneumonia with hypoxic acute respiratory failure, no oxygen needed when discharged 10-06-2019;  and positive 06-13-2020 result in epic, pt stated no symptoms   History of seizures    (05-04-2021  pt stated was told during hospital admission 05/ 2021 with covid , she had 2 seizures,  stated no medication and no seizure since)   Hypertension    Insulin  dependent type 2 diabetes mellitus [facility])    endocrinologist-- warren batty NP;   pt uses insulin  pump   Insulin  pump in place    Mixed hyperlipidemia    OSA (obstructive sleep apnea)    ( 05-04-2021 pt stated has not used cpap since 05/ 2021) followed by dr byrum;  study in epic 02-16-2019 moderate , titrated cpap 08-28-2019   Plantar fasciitis, right    w/ chronic pain    Patient Active Problem List   Diagnosis Date Noted   Acute abdominal pain in left lower quadrant 11/01/2022   Left tennis elbow 08/24/2022   Pain in both knees  08/24/2022   Neurogenic pain of right foot 08/24/2022   Pain of right lower extremity 07/26/2022   Palpitations 07/31/2021   Fatigue 07/31/2021   Primary hypertension 07/31/2021   Hip pain 12/08/2019   Hyperlipidemia associated with type 2 diabetes mellitus (HCC) 11/23/2019   History of COVID-19 10/27/2019   Stuttering 10/27/2019   Depression with anxiety 10/27/2019   Obesity, Class III, BMI 40-49.9 (morbid obesity) 06/30/2019   OSA (obstructive sleep apnea) 02/16/2019   Essential hypertension 06/22/2018   Type 2 diabetes mellitus with hyperglycemia, with long-term current use of insulin  (HCC) 06/22/2018   Type 2 diabetes mellitus with hyperosmolarity without coma, with long-term current use of insulin  (HCC) 12/13/2016   Asthma 07/30/2016   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/07/2016   DM neuropathy, type II diabetes mellitus (HCC) 08/24/2015    Past Surgical History:  Procedure Laterality Date   CESAREAN SECTION  1999   PLANTAR FASCIA RELEASE Right 05/08/2021   Procedure: ENDOSCOPIC PLANTAR FASCIOTOMY;  Surgeon: Burt Fus, DPM;  Location: [facility];  Service: Podiatry;  Laterality: Right;  WITH BLOCK   STERIOD INJECTION Bilateral 05/08/2021   Procedure: PLATELET RICH PLASMA INJECTION HEELS;  Surgeon: Stover, Titorya, DPM;  Location: [facility];  Service: Podiatry;  Laterality: Bilateral;   TUBAL LIGATION Bilateral 09/29/2006   @[facility] ;  [facility]   VENTRAL HERNIA REPAIR N/A 11/27/2017   Procedure: VENTRAL HERNIA REPAIR ERAS PATHWAY;  Surgeon: Vanderbilt Ned, MD;  Location: [facility];  Service: General;  Laterality: N/A;   WISDOM TOOTH EXTRACTION      OB History     Gravida  5   Para  4   Term      Preterm      AB  1   Living  4      SAB  1   IAB      Ectopic      Multiple      Live Births               Home Medications    Prior to Admission medications   Medication Sig Start Date End Date Taking? Authorizing  Provider  albuterol  (PROVENTIL ) (2.5 MG/3ML) 0.083% nebulizer solution Take 3 mLs (2.5 mg total) by nebulization every 6 (six) hours as needed for wheezing or shortness of breath. 02/04/24  Yes Reddick, Johnathan B, NP  albuterol  (VENTOLIN  HFA) 108 (90 Base) MCG/ACT inhaler Inhale 2 puffs into the lungs every 4 (four) hours as needed for wheezing or shortness of breath. 09/13/23  Yes Norleen Lynwood ORN, MD  atorvastatin  (LIPITOR) 40 MG tablet Take 1 tablet (40 mg total) by mouth daily. 08/12/23  Yes Newlin, Enobong, MD  azithromycin  (ZITHROMAX ) 250 MG tablet Take 2 tablets together on day 1, then take 1 tablet daily for 4 days 02/17/24  Yes Luella Gardenhire, Asberry, PA-C  budesonide  (RHINOCORT  ALLERGY) 32 MCG/ACT nasal spray Place 1 spray into both nostrils daily. 11/01/23  Yes Vicci Barnie NOVAK, MD  cetirizine  (ZYRTEC ) 10 MG tablet Take 1 tablet (10 mg total) by mouth daily. 11/01/23  Yes Vicci Barnie NOVAK, MD  Continuous Blood Gluc Sensor (DEXCOM G6 SENSOR) MISC USE AND CHANGE SENSOR EVERY 10 DAYS AS DIRECTED 03/14/22  Yes [provider]  Continuous Blood Gluc Sensor (DEXCOM G6 SENSOR) MISC SMARTSIG:Topical Every 10 Days   Yes [provider]  Continuous Blood Gluc Transmit (DEXCOM G6 TRANSMITTER) MISC USE TO CHECK BLOOD SUGAR EVERY DAY. CHANGE EVERY 3 MONTHS AS DIRECTED 03/14/22  Yes [provider]  Continuous Glucose Sensor (DEXCOM G7 SENSOR) MISC Change every 10 days. 12/09/23  Yes   insulin  aspart (NOVOLOG  FLEXPEN) 100 UNIT/ML FlexPen Inject into the skin 3 (three) times daily before meals. ONLY WHEN INSULIN  PUMP MALFUNCTION'S PER ENDOCRINOLOGY DIRECTION   Yes [provider]  insulin  aspart (NOVOLOG ) 100 UNIT/ML injection USE VIA INSULIN  PUMP. MAX TOTAL DAILY DOSE OF 300 UNITS 08/06/23  Yes   Insulin  Glargine Solostar 300 UNIT/ML SOPN Inject 70 Units into the skin daily. Only if insullin pump malfunction. 11/27/22  Yes   Insulin  Human (INSULIN  PUMP) SOLN Inject 1 each into the skin  See admin instructions. Medication: Novolog  100 units/ml injection. Takes 100 units via pump per patient.   Yes [provider]  Insulin  Syringe-Needle U-100 31G X 5/16 1 ML MISC inject 10 units into the skin 3 times daily. use to inject novolog  based on insulin  to card ratio- max daily dose 150 units 09/23/20  Yes   ipratropium-albuterol  (DUONEB) 0.5-2.5 (3) MG/3ML SOLN Take 3 mLs by nebulization every 6 (six) hours as needed. 02/04/24  Yes Reddick, Johnathan B, NP  lisinopril  (ZESTRIL ) 20 MG tablet Take 1 tablet by mouth daily. 08/06/23  Yes [provider]  naproxen  (NAPROSYN ) 500 MG tablet Take 1 tablet (500 mg total) by mouth 2 (two)  times daily. 11/27/23  Yes Stanhope, Dorna HERO, FNP  NOVOFINE PEN NEEDLE 32G X 6 MM MISC Use to administer insulin  daily 10/23/23  Yes [provider]  olopatadine  (PATANOL) 0.1 % ophthalmic solution Place 1 drop into both eyes 2 (two) times daily. 08/08/23  Yes Newlin, Enobong, MD  ondansetron  (ZOFRAN -ODT) 4 MG disintegrating tablet Take 1 tablet (4 mg total) by mouth every 8 (eight) hours as needed for nausea or vomiting. 11/27/23  Yes Enedelia Dorna HERO, FNP  pantoprazole  (PROTONIX ) 40 MG tablet Take 1 tablet (40 mg total) by mouth daily. 12/26/23  Yes Newlin, Enobong, MD  Semaglutide , 1 MG/DOSE, (OZEMPIC , 1 MG/DOSE,) 4 MG/3ML SOPN Inject 1 mg into the skin once a week. 12/09/23  Yes   sertraline  (ZOLOFT ) 100 MG tablet Take 1 tablet (100 mg total) by mouth daily. 09/13/23  Yes Norleen Lynwood ORN, MD  insulin  aspart (NOVOLOG ) 100 UNIT/ML injection USE VIA INSULIN  PUMP. MAX TOTAL DAILY DOSE OF 300 UNITS 12/09/23     OZEMPIC , 1 MG/DOSE, 4 MG/3ML SOPN Inject 1 mg into the skin. 10/17/23   [provider]  ARIPiprazole  (ABILIFY ) 5 MG tablet Take 1 tablet (5 mg total) by mouth daily. 10/27/19 06/13/20  Sater, Charlie LABOR, MD    Family History Family History  Problem Relation Age of Onset   Asthma Mother    Kidney failure Mother    Brain cancer  Mother    Asthma Father    Other Father        surgery on stomach but don't know from what   Breast cancer Maternal Grandmother    Diabetes Maternal Grandmother    Diabetes Brother     Social History Social History   Tobacco Use   Smoking status: Former    Current packs/day: 0.00    Average packs/day: 0.1 packs/day for 2.0 years (0.2 ttl pk-yrs)    Types: Cigarettes, E-cigarettes    Start date: 11/28/2015    Quit date: 11/27/2017    Years since quitting: 6.2   Smokeless tobacco: Never   Tobacco comments:    I vape every now and then.  Vaping Use   Vaping status: Former   Devices: Elfvar  (05-04-2021 per pt last vaped approx 2 wks ago)  Substance Use Topics   Alcohol use: No   Drug use: Never     Allergies   Morphine  and codeine , Glyburide , Hydrocodone , Ivp dye [iodinated contrast media], Mupirocin , and Oxycodone    Review of Systems Review of Systems As per HPI  Physical Exam Triage Vital Signs ED Triage Vitals  Encounter Vitals Group     BP 02/17/24 1025 138/84     Girls Systolic BP Percentile --      Girls Diastolic BP Percentile --      Boys Systolic BP Percentile --      Boys Diastolic BP Percentile --      Pulse Rate 02/17/24 1025 88     Resp 02/17/24 1025 20     Temp 02/17/24 1025 97.9 F (36.6 C)     Temp src --      SpO2 02/17/24 1025 93 %     Weight --      Height --      Head Circumference --      Peak Flow --      Pain Score 02/17/24 1021 7     Pain Loc --      Pain Education --      Exclude from Growth Chart --  No data found.  Updated Vital Signs BP 138/84   Pulse 88   Temp 97.9 F (36.6 C)   Resp 20   LMP  (LMP Unknown) Comment: PT does not had a period due to taking DEPO injections  SpO2 93%    Physical Exam Vitals and nursing note reviewed.  Constitutional:      General: She is not in acute distress.    Appearance: She is not ill-appearing or diaphoretic.  HENT:     Nose: No congestion or rhinorrhea.     Mouth/Throat:      Mouth: Mucous membranes are moist.     Pharynx: Oropharynx is clear. No posterior oropharyngeal erythema.  Eyes:     Conjunctiva/sclera: Conjunctivae normal.  Cardiovascular:     Rate and Rhythm: Normal rate and regular rhythm.     Pulses: Normal pulses.          Radial pulses are 2+ on the right side and 2+ on the left side.     Heart sounds: Normal heart sounds.  Pulmonary:     Effort: Pulmonary effort is normal. No respiratory distress.     Breath sounds: Normal breath sounds. No wheezing, rhonchi or rales.     Comments: Lungs are clear throughout. No wheezing or rales. Normal work of breathing, speaks in full sentences  Musculoskeletal:     Cervical back: Normal range of motion.  Lymphadenopathy:     Cervical: No cervical adenopathy.  Skin:    General: Skin is warm and dry.  Neurological:     General: No focal deficit present.     Mental Status: She is alert and oriented to person, place, and time.     Cranial Nerves: No cranial nerve deficit.     Sensory: No sensory deficit.     Motor: No weakness.     Coordination: Coordination normal.     Gait: Gait normal.     UC Treatments / Results  Labs (all labs ordered are listed, but only abnormal results are displayed) Labs Reviewed - No data to display  EKG  Radiology DG Chest 2 View Result Date: 02/17/2024 CLINICAL DATA:  short of breath, cough. EXAM: CHEST - 2 VIEW COMPARISON:  04/20/2023. FINDINGS: Bilateral lung fields are clear. Bilateral costophrenic angles are clear. Normal cardio-mediastinal silhouette. No acute osseous abnormalities. The soft tissues are within normal limits. IMPRESSION: No active cardiopulmonary disease. Electronically Signed   By: Ree Molt M.D.   On: 02/17/2024 11:06    Procedures Procedures   Medications Ordered in UC Medications  acetaminophen  (TYLENOL ) tablet 975 mg (975 mg Oral Given 02/17/24 1049)    Initial Impression / Assessment and Plan / UC Course  I have reviewed the  triage vital signs and the nursing notes.  Pertinent labs & imaging results that were available during my care of the patient were reviewed by me and considered in my medical decision making (see chart for details).  Declines offer for IM steroid today, then declines IM toradol  for headache  Tylenol  dose given  Chest xray negative. Images independently reviewed by me, agree with radiology interpretation. With 2 week duration, cover for atypical etiology with azithromycin  Declines oral steroids.  Advised follow closely with PCP If persisting or worsening symptoms, patient needs to be seen in the emergency department. Verbalizes understanding.  Final Clinical Impressions(s) / UC Diagnoses   Final diagnoses:  Shortness of breath  Wheezing  Acute cough  Acute nonintractable headache, unspecified headache type  Discharge Instructions      Your x-ray today does not show any infection, fluid, or any other abnormality.  I am still treating you with an antibiotic given the duration of your symptoms. Take the azithromycin  as directed. Take with food to avoid upset stomach.  Please continue your nebulizer treatments at home Continue tylenol  for headache.   Follow up with your primary care provider regarding symptoms. Please go to the emergency department if symptoms worsen or become severe.      ED Prescriptions     Medication Sig Dispense Auth. Provider   azithromycin  (ZITHROMAX ) 250 MG tablet Take 2 tablets together on day 1, then take 1 tablet daily for 4 days 6 tablet Thara Searing, Asberry, PA-C      PDMP not reviewed this encounter.   Kanija Remmel, Asberry, PA-C 02/17/24 1203

## 2024-02-17 NOTE — Discharge Instructions (Addendum)
 Your x-ray today does not show any infection, fluid, or any other abnormality.  I am still treating you with an antibiotic given the duration of your symptoms. Take the azithromycin  as directed. Take with food to avoid upset stomach.  Please continue your nebulizer treatments at home Continue tylenol  for headache.   Follow up with your primary care provider regarding symptoms. Please go to the emergency department if symptoms worsen or become severe.

## 2024-02-20 ENCOUNTER — Other Ambulatory Visit: Payer: Self-pay

## 2024-03-03 ENCOUNTER — Emergency Department (HOSPITAL_BASED_OUTPATIENT_CLINIC_OR_DEPARTMENT_OTHER)

## 2024-03-03 ENCOUNTER — Ambulatory Visit
Admission: EM | Admit: 2024-03-03 | Discharge: 2024-03-03 | Disposition: A | Discharge status: Other Acute Inpatient Hospital | Attending: Family Medicine | Admitting: Family Medicine

## 2024-03-03 ENCOUNTER — Emergency Department (HOSPITAL_BASED_OUTPATIENT_CLINIC_OR_DEPARTMENT_OTHER)
Admission: EM | Admit: 2024-03-03 | Discharge: 2024-03-03 | Disposition: A | Discharge status: Home / Self Care | Source: Ambulatory Visit | Attending: Emergency Medicine | Admitting: Emergency Medicine

## 2024-03-03 ENCOUNTER — Other Ambulatory Visit: Payer: Self-pay

## 2024-03-03 ENCOUNTER — Ambulatory Visit (HOSPITAL_COMMUNITY)

## 2024-03-03 ENCOUNTER — Other Ambulatory Visit (HOSPITAL_BASED_OUTPATIENT_CLINIC_OR_DEPARTMENT_OTHER): Payer: Self-pay

## 2024-03-03 DIAGNOSIS — I10 Essential (primary) hypertension: Secondary | ICD-10-CM | POA: Insufficient documentation

## 2024-03-03 DIAGNOSIS — M25511 Pain in right shoulder: Secondary | ICD-10-CM | POA: Diagnosis not present

## 2024-03-03 DIAGNOSIS — W108XXA Fall (on) (from) other stairs and steps, initial encounter: Secondary | ICD-10-CM | POA: Diagnosis not present

## 2024-03-03 DIAGNOSIS — M542 Cervicalgia: Secondary | ICD-10-CM | POA: Insufficient documentation

## 2024-03-03 DIAGNOSIS — E119 Type 2 diabetes mellitus without complications: Secondary | ICD-10-CM | POA: Insufficient documentation

## 2024-03-03 DIAGNOSIS — R1084 Generalized abdominal pain: Secondary | ICD-10-CM | POA: Insufficient documentation

## 2024-03-03 DIAGNOSIS — Z79899 Other long term (current) drug therapy: Secondary | ICD-10-CM | POA: Diagnosis not present

## 2024-03-03 DIAGNOSIS — M79631 Pain in right forearm: Secondary | ICD-10-CM | POA: Insufficient documentation

## 2024-03-03 DIAGNOSIS — R0789 Other chest pain: Secondary | ICD-10-CM | POA: Diagnosis not present

## 2024-03-03 DIAGNOSIS — M25531 Pain in right wrist: Secondary | ICD-10-CM | POA: Insufficient documentation

## 2024-03-03 DIAGNOSIS — S0990XA Unspecified injury of head, initial encounter: Secondary | ICD-10-CM | POA: Diagnosis present

## 2024-03-03 DIAGNOSIS — M546 Pain in thoracic spine: Secondary | ICD-10-CM | POA: Insufficient documentation

## 2024-03-03 DIAGNOSIS — M549 Dorsalgia, unspecified: Secondary | ICD-10-CM

## 2024-03-03 DIAGNOSIS — M79601 Pain in right arm: Secondary | ICD-10-CM

## 2024-03-03 DIAGNOSIS — Z794 Long term (current) use of insulin: Secondary | ICD-10-CM | POA: Insufficient documentation

## 2024-03-03 DIAGNOSIS — Z7984 Long term (current) use of oral hypoglycemic drugs: Secondary | ICD-10-CM | POA: Diagnosis not present

## 2024-03-03 DIAGNOSIS — M545 Low back pain, unspecified: Secondary | ICD-10-CM | POA: Diagnosis not present

## 2024-03-03 DIAGNOSIS — W19XXXA Unspecified fall, initial encounter: Secondary | ICD-10-CM

## 2024-03-03 LAB — COMPREHENSIVE METABOLIC PANEL WITH GFR
ALT: 91 U/L — ABNORMAL HIGH (ref 0–44)
AST: 26 U/L (ref 15–41)
Albumin: 4.2 g/dL (ref 3.5–5.0)
Alkaline Phosphatase: 101 U/L (ref 38–126)
Anion gap: 12 (ref 5–15)
BUN: 15 mg/dL (ref 6–20)
CO2: 21 mmol/L — ABNORMAL LOW (ref 22–32)
Calcium: 9.7 mg/dL (ref 8.9–10.3)
Chloride: 105 mmol/L (ref 98–111)
Creatinine, Ser: 0.7 mg/dL (ref 0.44–1.00)
GFR, Estimated: >60 mL/min (ref >60)
Glucose, Bld: 121 mg/dL — ABNORMAL HIGH (ref 70–99)
Potassium: 4.1 mmol/L (ref 3.5–5.1)
Sodium: 137 mmol/L (ref 135–145)
Total Bilirubin: 0.4 mg/dL (ref 0.0–1.2)
Total Protein: 6.9 g/dL (ref 6.5–8.1)

## 2024-03-03 LAB — CBC WITH DIFFERENTIAL/PLATELET
Abs Immature Granulocytes: 0.03 K/uL (ref 0.00–0.07)
Basophils Absolute: 0 K/uL (ref 0.0–0.1)
Basophils Relative: 1 %
Eosinophils Absolute: 0 K/uL (ref 0.0–0.5)
Eosinophils Relative: 0 %
HCT: 43 % (ref 36.0–46.0)
Hemoglobin: 14.5 g/dL (ref 12.0–15.0)
Immature Granulocytes: 0 %
Lymphocytes Relative: 51 %
Lymphs Abs: 3.5 K/uL (ref 0.7–4.0)
MCH: 29.9 pg (ref 26.0–34.0)
MCHC: 33.7 g/dL (ref 30.0–36.0)
MCV: 88.7 fL (ref 80.0–100.0)
Monocytes Absolute: 0.6 K/uL (ref 0.1–1.0)
Monocytes Relative: 8 %
Neutro Abs: 2.8 K/uL (ref 1.7–7.7)
Neutrophils Relative %: 40 %
Platelets: 331 K/uL (ref 150–400)
RBC: 4.85 MIL/uL (ref 3.87–5.11)
RDW: 12.1 % (ref 11.5–15.5)
WBC: 7 K/uL (ref 4.0–10.5)
nRBC: 0 % (ref 0.0–0.2)

## 2024-03-03 LAB — HCG, SERUM, QUALITATIVE: Preg, Serum: NEGATIVE

## 2024-03-03 LAB — LACTIC ACID, PLASMA: Lactic Acid, Venous: 1 mmol/L (ref 0.5–1.9)

## 2024-03-03 LAB — TROPONIN T, HIGH SENSITIVITY: Troponin T High Sensitivity: <15 ng/L (ref 0–19)

## 2024-03-03 MED ORDER — NAPROXEN 250 MG PO TABS
500.0000 mg | ORAL_TABLET | Freq: Once | ORAL | Status: AC
  Administered 2024-03-03: 500 mg via ORAL
  Filled 2024-03-03: qty 2

## 2024-03-03 MED ORDER — METHOCARBAMOL 500 MG PO TABS
500.0000 mg | ORAL_TABLET | Freq: Two times a day (BID) | ORAL | 0 refills | Status: AC
  Filled 2024-03-03: qty 20, 10d supply, fill #0

## 2024-03-03 NOTE — ED Triage Notes (Signed)
 Patient states fell down 2-3 stairs yesterday. States generalized pain everywhere Primarily to head, neck, and right arm.

## 2024-03-03 NOTE — Discharge Instructions (Signed)
 Please go to the emergency room for further evaluation of your head injury that resulted in your vomiting and your neck injury

## 2024-03-03 NOTE — ED Notes (Signed)
 DC paperwork given and verbally understood.

## 2024-03-03 NOTE — ED Provider Notes (Signed)
 [facility]    CSN: [id number] Arrival date & time: 03/03/24  1152      History   Chief Complaint Chief Complaint  Patient presents with   Fall    HPI Sarah Garrison is a 43 y.o. female presents for fall.  Patient reports 5 PM yesterday she was going out the back of her house when she slipped falling down 2 steep stairs.  She states she did hit the back of her head as well as her neck and her right forearm.  States she was dazed but denies LOC but did have an episode of vomiting right after.  She does states she had headache with dizziness that persisted through yesterday.  Slight headache today.  No additional vomiting episodes.  No visual changes.  Does report her right arm feels weaker than her left but she thinks is because her forearm hurts.  No numbness or tingling.  She is not on blood thinning medications.  No other injuries or concerns at this time.   Fall    Past Medical History:  Diagnosis Date   Anxiety    Asthma    Chronic constipation    Chronic shortness of breath    Depression    GERD (gastroesophageal reflux disease)    History of COVID-19 09/29/2019   09-28-2020 result in epic; hospital admission 10-05-2019 for covid pneumonia with hypoxic acute respiratory failure, no oxygen needed when discharged 10-06-2019;  and positive 06-13-2020 result in epic, pt stated no symptoms   History of seizures    (05-04-2021  pt stated was told during hospital admission 05/ 2021 with covid , she had 2 seizures,  stated no medication and no seizure since)   Hypertension    Insulin  dependent type 2 diabetes mellitus [facility])    endocrinologist-- warren batty NP;   pt uses insulin  pump   Insulin  pump in place    Mixed hyperlipidemia    OSA (obstructive sleep apnea)    ( 05-04-2021 pt stated has not used cpap since 05/ 2021) followed by dr byrum;  study in epic 02-16-2019 moderate , titrated cpap 08-28-2019   Plantar fasciitis, right    w/ chronic pain     Patient Active Problem List   Diagnosis Date Noted   Acute abdominal pain in left lower quadrant 11/01/2022   Left tennis elbow 08/24/2022   Pain in both knees 08/24/2022   Neurogenic pain of right foot 08/24/2022   Pain of right lower extremity 07/26/2022   Palpitations 07/31/2021   Fatigue 07/31/2021   Primary hypertension 07/31/2021   Hip pain 12/08/2019   Hyperlipidemia associated with type 2 diabetes mellitus (HCC) 11/23/2019   History of COVID-19 10/27/2019   Stuttering 10/27/2019   Depression with anxiety 10/27/2019   Obesity, Class III, BMI 40-49.9 (morbid obesity) 06/30/2019   OSA (obstructive sleep apnea) 02/16/2019   Essential hypertension 06/22/2018   Type 2 diabetes mellitus with hyperglycemia, with long-term current use of insulin  (HCC) 06/22/2018   Type 2 diabetes mellitus with hyperosmolarity without coma, with long-term current use of insulin  (HCC) 12/13/2016   Asthma 07/30/2016   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/07/2016   DM neuropathy, type II diabetes mellitus (HCC) 08/24/2015    Past Surgical History:  Procedure Laterality Date   CESAREAN SECTION  1999   PLANTAR FASCIA RELEASE Right 05/08/2021   Procedure: ENDOSCOPIC PLANTAR FASCIOTOMY;  Surgeon: Stover, Titorya, DPM;  Location: [facility];  Service: Podiatry;  Laterality: Right;  WITH BLOCK   STERIOD  INJECTION Bilateral 05/08/2021   Procedure: PLATELET RICH PLASMA INJECTION HEELS;  Surgeon: Burt Fus, DPM;  Location: [facility];  Service: Podiatry;  Laterality: Bilateral;   TUBAL LIGATION Bilateral 09/29/2006   @[facility] ;   [facility]   VENTRAL HERNIA REPAIR N/A 11/27/2017   Procedure: VENTRAL HERNIA REPAIR ERAS PATHWAY;  Surgeon: Vanderbilt Ned, MD;  Location: [facility];  Service: General;  Laterality: N/A;   WISDOM TOOTH EXTRACTION      OB History     Gravida  5   Para  4   Term      Preterm      AB  1   Living  4      SAB  1   IAB      Ectopic       Multiple      Live Births               Home Medications    Prior to Admission medications   Medication Sig Start Date End Date Taking? Authorizing Provider  albuterol  (PROVENTIL ) (2.5 MG/3ML) 0.083% nebulizer solution Take 3 mLs (2.5 mg total) by nebulization every 6 (six) hours as needed for wheezing or shortness of breath. 02/04/24   Reddick, Johnathan B, NP  albuterol  (VENTOLIN  HFA) 108 (90 Base) MCG/ACT inhaler Inhale 2 puffs into the lungs every 4 (four) hours as needed for wheezing or shortness of breath. 09/13/23   Norleen Lynwood ORN, MD  atorvastatin  (LIPITOR) 40 MG tablet Take 1 tablet (40 mg total) by mouth daily. 08/12/23   Newlin, Enobong, MD  azithromycin  (ZITHROMAX ) 250 MG tablet Take 2 tablets together on day 1, then take 1 tablet daily for 4 days 02/17/24   Rising, Asberry, PA-C  budesonide  (RHINOCORT  ALLERGY) 32 MCG/ACT nasal spray Place 1 spray into both nostrils daily. 11/01/23   Vicci Barnie NOVAK, MD  cetirizine  (ZYRTEC ) 10 MG tablet Take 1 tablet (10 mg total) by mouth daily. 11/01/23   Vicci Barnie NOVAK, MD  Continuous Blood Gluc Sensor (DEXCOM G6 SENSOR) MISC USE AND CHANGE SENSOR EVERY 10 DAYS AS DIRECTED 03/14/22   [provider]  Continuous Blood Gluc Sensor (DEXCOM G6 SENSOR) MISC SMARTSIG:Topical Every 10 Days    [provider]  Continuous Blood Gluc Transmit (DEXCOM G6 TRANSMITTER) MISC USE TO CHECK BLOOD SUGAR EVERY DAY. CHANGE EVERY 3 MONTHS AS DIRECTED 03/14/22   [provider]  Continuous Glucose Sensor (DEXCOM G7 SENSOR) MISC Change every 10 days. 12/09/23     insulin  aspart (NOVOLOG  FLEXPEN) 100 UNIT/ML FlexPen Inject into the skin 3 (three) times daily before meals. ONLY WHEN INSULIN  PUMP MALFUNCTION'S PER ENDOCRINOLOGY DIRECTION    [provider]  insulin  aspart (NOVOLOG ) 100 UNIT/ML injection USE VIA INSULIN  PUMP. MAX TOTAL DAILY DOSE OF 300 UNITS 08/06/23     insulin  aspart (NOVOLOG ) 100 UNIT/ML injection USE VIA INSULIN   PUMP. MAX TOTAL DAILY DOSE OF 300 UNITS 12/09/23     Insulin  Glargine Solostar 300 UNIT/ML SOPN Inject 70 Units into the skin daily. Only if insullin pump malfunction. 11/27/22     Insulin  Human (INSULIN  PUMP) SOLN Inject 1 each into the skin See admin instructions. Medication: Novolog  100 units/ml injection. Takes 100 units via pump per patient.    [provider]  Insulin  Syringe-Needle U-100 31G X 5/16 1 ML MISC inject 10 units into the skin 3 times daily. use to inject novolog  based on insulin  to card ratio- max daily dose 150 units 09/23/20  ipratropium-albuterol  (DUONEB) 0.5-2.5 (3) MG/3ML SOLN Take 3 mLs by nebulization every 6 (six) hours as needed. 02/04/24   Reddick, Johnathan B, NP  lisinopril  (ZESTRIL ) 20 MG tablet Take 1 tablet by mouth daily. 08/06/23   [provider]  naproxen  (NAPROSYN ) 500 MG tablet Take 1 tablet (500 mg total) by mouth 2 (two) times daily. 11/27/23   Enedelia Dorna HERO, FNP  NOVOFINE PEN NEEDLE 32G X 6 MM MISC Use to administer insulin  daily 10/23/23   [provider]  olopatadine  (PATANOL) 0.1 % ophthalmic solution Place 1 drop into both eyes 2 (two) times daily. 08/08/23   Newlin, Enobong, MD  ondansetron  (ZOFRAN -ODT) 4 MG disintegrating tablet Take 1 tablet (4 mg total) by mouth every 8 (eight) hours as needed for nausea or vomiting. 11/27/23   Enedelia Dorna HERO, FNP  OZEMPIC , 1 MG/DOSE, 4 MG/3ML SOPN Inject 1 mg into the skin. 10/17/23   [provider]  pantoprazole  (PROTONIX ) 40 MG tablet Take 1 tablet (40 mg total) by mouth daily. 12/26/23   Newlin, Enobong, MD  Semaglutide , 1 MG/DOSE, (OZEMPIC , 1 MG/DOSE,) 4 MG/3ML SOPN Inject 1 mg into the skin once a week. 12/09/23     sertraline  (ZOLOFT ) 100 MG tablet Take 1 tablet (100 mg total) by mouth daily. 09/13/23   Norleen Lynwood ORN, MD  ARIPiprazole  (ABILIFY ) 5 MG tablet Take 1 tablet (5 mg total) by mouth daily. 10/27/19 06/13/20  Sater, Charlie LABOR, MD    Family History Family History   Problem Relation Age of Onset   Asthma Mother    Kidney failure Mother    Brain cancer Mother    Asthma Father    Other Father        surgery on stomach but don't know from what   Breast cancer Maternal Grandmother    Diabetes Maternal Grandmother    Diabetes Brother     Social History Social History   Tobacco Use   Smoking status: Former    Current packs/day: 0.00    Average packs/day: 0.1 packs/day for 2.0 years (0.2 ttl pk-yrs)    Types: Cigarettes, E-cigarettes    Start date: 11/28/2015    Quit date: 11/27/2017    Years since quitting: 6.2   Smokeless tobacco: Never   Tobacco comments:    I vape every now and then.  Vaping Use   Vaping status: Former   Devices: Elfvar  (05-04-2021 per pt last vaped approx 2 wks ago)  Substance Use Topics   Alcohol use: No   Drug use: Never     Allergies   Morphine  and codeine , Glyburide , Hydrocodone , Ivp dye [iodinated contrast media], Mupirocin , and Oxycodone    Review of Systems Review of Systems  Musculoskeletal:  Positive for back pain and neck pain.       Right forearm  Neurological:        Head injury     Physical Exam Triage Vital Signs ED Triage Vitals  Encounter Vitals Group     BP 03/03/24 1220 (!) 163/95     Girls Systolic BP Percentile --      Girls Diastolic BP Percentile --      Boys Systolic BP Percentile --      Boys Diastolic BP Percentile --      Pulse Rate 03/03/24 1220 (!) 109     Resp 03/03/24 1222 17     Temp 03/03/24 1220 99.1 F (37.3 C)     Temp Source 03/03/24 1220 Oral  SpO2 03/03/24 1220 95 %     Weight --      Height --      Head Circumference --      Peak Flow --      Pain Score 03/03/24 1219 6     Pain Loc --      Pain Education --      Exclude from Growth Chart --    No data found.  Updated Vital Signs BP (!) 163/95 (BP Location: Left Arm)   Pulse (!) 109   Temp 99.1 F (37.3 C) (Oral)   Resp 17   LMP  (LMP Unknown) Comment: PT does not had a period due to taking  DEPO injections  SpO2 95%   Visual Acuity Right Eye Distance:   Left Eye Distance:   Bilateral Distance:    Right Eye Near:   Left Eye Near:    Bilateral Near:     Physical Exam Vitals and nursing note reviewed.  Constitutional:      General: She is not in acute distress.    Appearance: Normal appearance. She is obese. She is not ill-appearing.  HENT:     Head: Normocephalic. No raccoon eyes or Battle's sign.     Right Ear: No hemotympanum.     Left Ear: No hemotympanum.  Eyes:     Extraocular Movements: Extraocular movements intact.     Conjunctiva/sclera: Conjunctivae normal.     Pupils: Pupils are equal, round, and reactive to light.  Neck:      Comments: Patient is tender to palpation along cervical spine that extends to bilateral trapezius muscles. Cardiovascular:     Rate and Rhythm: Tachycardia present.  Pulmonary:     Effort: Pulmonary effort is normal.  Musculoskeletal:     Cervical back: Neck supple. Signs of trauma present. No edema, rigidity, torticollis or crepitus. Pain with movement, spinous process tenderness and muscular tenderness present.  Skin:    General: Skin is warm and dry.  Neurological:     Mental Status: She is alert and oriented to person, place, and time.     GCS: GCS eye subscore is 4. GCS verbal subscore is 5. GCS motor subscore is 6.     Cranial Nerves: No facial asymmetry.     Motor: Weakness present.     Coordination: Romberg sign positive. Finger-Nose-Finger Test normal.     Gait: Tandem walk abnormal.     Comments: Strength on right upper extremity 4-5 strength on left upper extremity 5 out of 5  Psychiatric:        Mood and Affect: Mood normal.        Behavior: Behavior normal.      UC Treatments / Results  Labs (all labs ordered are listed, but only abnormal results are displayed) Labs Reviewed - No data to display  EKG   Radiology No results found.  Procedures Procedures (including critical care time)  Medications  Ordered in UC Medications - No data to display  Initial Impression / Assessment and Plan / UC Course  I have reviewed the triage vital signs and the nursing notes.  Pertinent labs & imaging results that were available during my care of the patient were reviewed by me and considered in my medical decision making (see chart for details).     I reviewed injuries with patient.  I discussed limitations and abilities of urgent care.  Patient with fall last night downstairs with head injury with episode of vomiting as well as  neck pain with right arm weakness.  Given the nature of her injury and her symptoms advise she go to the emergency room for further imaging as she may need a CT head and neck.  Patient is in agreement with plan and will go POV to the emergency room.  She declines EMS transfer. Final Clinical Impressions(s) / UC Diagnoses   Final diagnoses:  Fall down stairs, initial encounter  Injury of head, initial encounter  Neck pain     Discharge Instructions      Please go to the emergency room for further evaluation of your head injury that resulted in your vomiting and your neck injury    ED Prescriptions   None    PDMP not reviewed this encounter.   Loreda Myla SAUNDERS, NP 03/03/24 1257

## 2024-03-03 NOTE — ED Provider Notes (Signed)
 [place] [facility] DE[facility]MENT AT [facility] Provider Note   CSN: [id number] Arrival date & time: 03/03/24  1321     Patient presents with: Sarah Garrison is a 43 y.o. female with past medical history of T2DM, HTN, OSA presents Emergency Department for evaluation of chest pain, abdominal pain, head injury, neck pain, right arm pain, right shoulder pain following fall last evening when taking her dog out.  She reports that she slipped down 2-3 high steps then hit the back of her head and had 1 episode of vomiting following.  Has been taking Tylenol  and naproxen  for pain with some relief.  Did not seek ED evaluation last night as she called around and ED wait times were too long. Denies LOC, complaints prior to injury     Fall       Prior to Admission medications   Medication Sig Start Date End Date Taking? Authorizing Provider  albuterol  (PROVENTIL ) (2.5 MG/3ML) 0.083% nebulizer solution Take 3 mLs (2.5 mg total) by nebulization every 6 (six) hours as needed for wheezing or shortness of breath. 02/04/24   Reddick, Johnathan B, NP  albuterol  (VENTOLIN  HFA) 108 (90 Base) MCG/ACT inhaler Inhale 2 puffs into the lungs every 4 (four) hours as needed for wheezing or shortness of breath. 09/13/23   Norleen Lynwood ORN, MD  atorvastatin  (LIPITOR) 40 MG tablet Take 1 tablet (40 mg total) by mouth daily. 08/12/23   Newlin, Enobong, MD  azithromycin  (ZITHROMAX ) 250 MG tablet Take 2 tablets together on day 1, then take 1 tablet daily for 4 days 02/17/24   Rising, Asberry, PA-C  budesonide  (RHINOCORT  ALLERGY) 32 MCG/ACT nasal spray Place 1 spray into both nostrils daily. 11/01/23   Vicci Barnie NOVAK, MD  cetirizine  (ZYRTEC ) 10 MG tablet Take 1 tablet (10 mg total) by mouth daily. 11/01/23   Vicci Barnie NOVAK, MD  Continuous Blood Gluc Sensor (DEXCOM G6 SENSOR) MISC USE AND CHANGE SENSOR EVERY 10 DAYS AS DIRECTED 03/14/22   [provider]  Continuous Blood Gluc Sensor (DEXCOM G6  SENSOR) MISC SMARTSIG:Topical Every 10 Days    [provider]  Continuous Blood Gluc Transmit (DEXCOM G6 TRANSMITTER) MISC USE TO CHECK BLOOD SUGAR EVERY DAY. CHANGE EVERY 3 MONTHS AS DIRECTED 03/14/22   [provider]  Continuous Glucose Sensor (DEXCOM G7 SENSOR) MISC Change every 10 days. 12/09/23     insulin  aspart (NOVOLOG  FLEXPEN) 100 UNIT/ML FlexPen Inject into the skin 3 (three) times daily before meals. ONLY WHEN INSULIN  PUMP MALFUNCTION'S PER ENDOCRINOLOGY DIRECTION    [provider]  insulin  aspart (NOVOLOG ) 100 UNIT/ML injection USE VIA INSULIN  PUMP. MAX TOTAL DAILY DOSE OF 300 UNITS 08/06/23     insulin  aspart (NOVOLOG ) 100 UNIT/ML injection USE VIA INSULIN  PUMP. MAX TOTAL DAILY DOSE OF 300 UNITS 12/09/23     Insulin  Glargine Solostar 300 UNIT/ML SOPN Inject 70 Units into the skin daily. Only if insullin pump malfunction. 11/27/22     Insulin  Human (INSULIN  PUMP) SOLN Inject 1 each into the skin See admin instructions. Medication: Novolog  100 units/ml injection. Takes 100 units via pump per patient.    [provider]  Insulin  Syringe-Needle U-100 31G X 5/16 1 ML MISC inject 10 units into the skin 3 times daily. use to inject novolog  based on insulin  to card ratio- max daily dose 150 units 09/23/20     ipratropium-albuterol  (DUONEB) 0.5-2.5 (3) MG/3ML SOLN Take 3 mLs by nebulization every 6 (six) hours as needed. 02/04/24  Reddick, Johnathan B, NP  lisinopril  (ZESTRIL ) 20 MG tablet Take 1 tablet by mouth daily. 08/06/23   [provider]  naproxen  (NAPROSYN ) 500 MG tablet Take 1 tablet (500 mg total) by mouth 2 (two) times daily. 11/27/23   Enedelia Dorna HERO, FNP  NOVOFINE PEN NEEDLE 32G X 6 MM MISC Use to administer insulin  daily 10/23/23   [provider]  olopatadine  (PATANOL) 0.1 % ophthalmic solution Place 1 drop into both eyes 2 (two) times daily. 08/08/23   Newlin, Enobong, MD  ondansetron  (ZOFRAN -ODT) 4 MG disintegrating tablet Take 1  tablet (4 mg total) by mouth every 8 (eight) hours as needed for nausea or vomiting. 11/27/23   Enedelia Dorna HERO, FNP  OZEMPIC , 1 MG/DOSE, 4 MG/3ML SOPN Inject 1 mg into the skin. 10/17/23   [provider]  pantoprazole  (PROTONIX ) 40 MG tablet Take 1 tablet (40 mg total) by mouth daily. 12/26/23   Newlin, Enobong, MD  Semaglutide , 1 MG/DOSE, (OZEMPIC , 1 MG/DOSE,) 4 MG/3ML SOPN Inject 1 mg into the skin once a week. 12/09/23     sertraline  (ZOLOFT ) 100 MG tablet Take 1 tablet (100 mg total) by mouth daily. 09/13/23   Norleen Lynwood ORN, MD  ARIPiprazole  (ABILIFY ) 5 MG tablet Take 1 tablet (5 mg total) by mouth daily. 10/27/19 06/13/20  Sater, Charlie LABOR, MD    Allergies: Morphine  and codeine , Glyburide , Hydrocodone , Ivp dye [iodinated contrast media], Mupirocin , and Oxycodone     Review of Systems  Musculoskeletal:  Positive for neck pain.    Updated Vital Signs BP (!) 172/112 (BP Location: Right Arm)   Pulse (!) 58   Temp 98.6 F (37 C) (Oral)   Resp 20   LMP  (LMP Unknown) Comment: PT does not had a period due to taking DEPO injections  SpO2 100%   Physical Exam Vitals and nursing note reviewed.  Constitutional:      General: She is not in acute distress.    Appearance: Normal appearance. She is not diaphoretic.  HENT:     Head: Normocephalic and atraumatic. No raccoon eyes or Battle's sign.     Comments: No hematoma nor TTP of cranium No crepitus to facial bones    Right Ear: External ear normal. No hemotympanum.     Left Ear: External ear normal. No hemotympanum.     Nose: Nose normal.     Right Nostril: No epistaxis or septal hematoma.     Left Nostril: No epistaxis or septal hematoma.     Mouth/Throat:     Mouth: Mucous membranes are moist. No injury or lacerations.  Eyes:     General: Lids are normal. Vision grossly intact. No visual field deficit.       Right eye: No discharge.        Left eye: No discharge.     Extraocular Movements: Extraocular movements intact.      Right eye: Normal extraocular motion and no nystagmus.     Left eye: Normal extraocular motion and no nystagmus.     Conjunctiva/sclera: Conjunctivae normal.     Pupils: Pupils are equal, round, and reactive to light.     Comments: No signs of EOM entrapment  Neck:     Vascular: No carotid bruit.  Cardiovascular:     Rate and Rhythm: Normal rate.     Pulses: Normal pulses.          Radial pulses are 2+ on the right side and 2+ on the left side.  Dorsalis pedis pulses are 2+ on the right side and 2+ on the left side.  Pulmonary:     Effort: Pulmonary effort is normal. No respiratory distress.     Breath sounds: Normal breath sounds. No wheezing.  Chest:     Chest wall: Tenderness present. No crepitus.  Abdominal:     General: Bowel sounds are normal. There is no distension.     Palpations: Abdomen is soft.     Tenderness: There is generalized abdominal tenderness. There is no guarding or rebound.  Musculoskeletal:     Cervical back: Full passive range of motion without pain, normal range of motion and neck supple. Bony tenderness present. No deformity or rigidity. Normal range of motion.     Thoracic back: Bony tenderness present. No deformity. Normal range of motion.     Lumbar back: Bony tenderness present. No deformity. Normal range of motion.     Right hip: No bony tenderness or crepitus.     Left hip: No bony tenderness or crepitus.     Right lower leg: No edema.     Left lower leg: No edema.     Comments: TTP of cervical, thoracic, lumbar spinous processes.  No crepitus, deformity, step-off palpated.  TTP of right shoulder, right forearm, right wrist.  ROM WNL.  No gross swelling, erythema, warmth  Skin:    General: Skin is warm and dry.     Capillary Refill: Capillary refill takes less than 2 seconds.     Coloration: Skin is not jaundiced or pale.  Neurological:     General: No focal deficit present.     Mental Status: She is alert and oriented to person, place, and  time. Mental status is at baseline.     GCS: GCS eye subscore is 4. GCS verbal subscore is 5. GCS motor subscore is 6.     Cranial Nerves: Cranial nerves 2-12 are intact. No cranial nerve deficit, dysarthria or facial asymmetry.     Sensory: Sensation is intact. No sensory deficit.     Motor: Motor function is intact. No weakness, tremor, atrophy, abnormal muscle tone, seizure activity or pronator drift.     Coordination: Coordination is intact. Coordination normal. Finger-Nose-Finger Test and Heel to [facility] Test normal.     Gait: Gait is intact. Gait normal.     Deep Tendon Reflexes: Reflexes are normal and symmetric. Reflexes normal.     Comments: following commands appropriately.  Denies visual disturbances.  No slurred speech nor aphasia.  Ambulates without difficulty     (all labs ordered are listed, but only abnormal results are displayed) Labs Reviewed  CBC WITH DIFFERENTIAL/PLATELET  COMPREHENSIVE METABOLIC PANEL WITH GFR  LACTIC ACID, PLASMA  HCG, SERUM, QUALITATIVE  TROPONIN T, HIGH SENSITIVITY    EKG: None  Radiology: No results found. =  Medications Ordered in the ED  naproxen  (NAPROSYN ) tablet 500 mg (has no administration in time range)                                    Medical Decision Making Amount and/or Complexity of Data Reviewed Labs: ordered. Radiology: ordered.  Risk Prescription drug management.   Patient presents to the ED for concern of pain following fall, this involves an extensive number of treatment options, and is a complaint that carries with it a high risk of complications and morbidity.  The differential diagnosis includes fracture, contusion, dislocation, intrathoracic  injury, intra-abdominal injury, ICH, concussion   Co morbidities that complicate the patient evaluation  See HPI   Additional history obtained:  Additional history obtained from Nursing   External records from outside source obtained and reviewed including triage  RN note   Lab Tests:  I Ordered, and personally interpreted labs.  The pertinent results include:   CBG 121 ALT 91   Imaging Studies ordered:  I ordered imaging studies including CT head, cervical spine, chest abdomen pelvis, T-spine, L-spine, right shoulder x-ray, right wrist x-ray, right forearm x-ray I independently visualized and interpreted imaging which showed no acute traumatic injury.  Specifics can be noted on each individual impression I agree with the radiologist interpretation   Cardiac Monitoring:  The patient was maintained on a cardiac monitor   Medicines ordered and prescription drug management:  I ordered medication including naproxen  for pain Reevaluation of the patient after these medicines showed that the patient improved I have reviewed the patients home medicines and have made adjustments as needed     Problem List / ED Course:  Fall Vital signs hemodynamically stable with no hypotension or tachycardia Head injury Neck pain May have sustained concussion as she had 1 episode of vomiting following injury CT head and cervical spine wo abnormalities Neurologically intact with no focal deficits.  No visual disturbances nor dizziness. May have sustained concussion as she did have 1 episode of vomiting following head injury.  I did provide her with sports medicine follow-up if she continues to have any lightheadedness Discussed strict return precautions Thoracic spine pain Lumbar spine pain CT thoracic and lumbar spine wo abnormalities No paresthesia no numbness to legs.  Able to ambulate without difficulty. Chest pain No ecchymosis nor crepitus to chest, clavicles, ribs Troponin negative CT chest wo abnormalities Low suspicion for cardiac injury, tamponade As pain started following fall yesterday, do not feel that additional troponin is necessary as it would have been positive if sustained cardiac injury by this point CP reproducible and likely 2/2  to MSK vs cardiac etiology/ACS. No pain wo palpation of sternum Abdominal pain No ecchymosis  CT abd wo abnormalities Right shoulder pain Right forearm pain Right wrist pain No obvious deformities.  Neurologically intact with no motor and or sensory deficit.  Well-perfused extremity with palpable pulses X-rays negative Provided naproxen  for pain in ED as she has multiple allergies and wishes to proceed with naproxen  for pain management Will also provide robaxin  for possible muscle strain. Tylenol  and ibuprofen  as needed for pain. Topical pain relief, heat/cold compresses, and rest discussed   Reevaluation:  After the interventions noted above, I reevaluated the patient and found that they have :improved   Social Determinants of Health:  Former smoker   Dispostion:  After consideration of the diagnostic results and the patients response to treatment, I feel that the patent would benefit from outpatient management symptomatic care.   Discussed ED workup, disposition, return to ED precautions with patient who expresses understanding agrees with plan.  All questions answered to their satisfaction.  They are agreeable to plan.  Discharge instructions provided on paperwork  Final diagnoses:  Fall, initial encounter  Injury of head, initial encounter  Neck pain  Spine pain  Right arm pain  Generalized abdominal pain    ED Discharge Orders     None        Minnie Tinnie BRAVO, PA 03/03/24 1830    Freddi Hamilton, MD 03/04/24 [phone]

## 2024-03-03 NOTE — ED Notes (Signed)
 Patient is being discharged from the Urgent Care and sent to the Emergency Department via POV . Per Myla Bold, P, patient is in need of higher level of care due to needing further testing. Patient is aware and verbalizes understanding of plan of care.  Vitals:   03/03/24 1220 03/03/24 1222  BP: (!) 163/95   Pulse: (!) 109   Resp:  17  Temp: 99.1 F (37.3 C)   SpO2: 95%

## 2024-03-03 NOTE — ED Triage Notes (Signed)
 Pt present with neck, rt forearm and buttock pain. States it hurts to move her neck. States her lower back is sore and has a knot on her forearm. Shereports possibly twisting her wrist when getting back up.   Pt states she fell down the stairs at home yesterday around 5pm.

## 2024-03-03 NOTE — Discharge Instructions (Addendum)
 Thank you for letting us  evaluate you today.  Your CT imaging did not show any bleeding in the brain, injury of the chest, intra-abdominal injury from fall.  You may have sustained a concussion however there is no specific test or imaging that shows this.  Your x-rays of your shoulder, right arm were negative for fracture.  We have given you naproxen  here Emergency Department for pain.  You may continue naproxen  and Tylenol  as needed for pain.  You can follow-up with sports medicine clinic if you continue to have lightheadedness which may be secondary to concussion.  Make sure to get plenty of rest, sleeping, decreased screen time, low lights, low stress to help with healing from concussion.  Is really important that you do not hit your head again within the next couple weeks.  As for neck pain I have also provided a muscle relaxer to help with pain, muscle strain.  This also may cause you to be drowsy.  Do not drink alcohol, drive or operate heavy machinery while using this.  You may also use lidocaine  patches, Salonpas, Voltaren  gel for topical pain relief.  These are found over-the-counter.  Return to Emergency Department if you experience another head injury, loss of consciousness, numbness or weakness in one-sided body, inability to walk, dizziness at rest, worsening symptoms

## 2024-03-16 ENCOUNTER — Other Ambulatory Visit: Payer: Self-pay

## 2024-03-16 ENCOUNTER — Ambulatory Visit (HOSPITAL_COMMUNITY): Admitting: Clinical

## 2024-03-18 ENCOUNTER — Ambulatory Visit

## 2024-03-18 NOTE — Progress Notes (Signed)
 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   Orthotics were dispensed and fit was satisfactory. Reviewed instructions for break-in and wear. Written instructions given to patient.  Patient will follow up as needed.   Sarah Garrison,

## 2024-03-26 ENCOUNTER — Ambulatory Visit (HOSPITAL_COMMUNITY)

## 2024-03-27 ENCOUNTER — Other Ambulatory Visit: Payer: Self-pay | Admitting: Internal Medicine

## 2024-03-28 ENCOUNTER — Other Ambulatory Visit: Payer: Self-pay

## 2024-03-30 ENCOUNTER — Other Ambulatory Visit: Payer: Self-pay

## 2024-03-31 ENCOUNTER — Other Ambulatory Visit: Payer: Self-pay

## 2024-04-01 ENCOUNTER — Other Ambulatory Visit

## 2024-04-01 ENCOUNTER — Other Ambulatory Visit: Payer: Self-pay

## 2024-04-03 ENCOUNTER — Other Ambulatory Visit: Payer: Self-pay

## 2024-04-08 ENCOUNTER — Other Ambulatory Visit: Payer: Self-pay

## 2024-04-08 ENCOUNTER — Ambulatory Visit: Admitting: Podiatry

## 2024-04-08 ENCOUNTER — Ambulatory Visit (INDEPENDENT_AMBULATORY_CARE_PROVIDER_SITE_OTHER): Admitting: Licensed Clinical Social Worker

## 2024-04-08 DIAGNOSIS — L0889 Other specified local infections of the skin and subcutaneous tissue: Secondary | ICD-10-CM

## 2024-04-08 DIAGNOSIS — F4324 Adjustment disorder with disturbance of conduct: Secondary | ICD-10-CM | POA: Insufficient documentation

## 2024-04-08 DIAGNOSIS — F4310 Post-traumatic stress disorder, unspecified: Secondary | ICD-10-CM | POA: Insufficient documentation

## 2024-04-08 MED ORDER — CLOTRIMAZOLE-BETAMETHASONE 1-0.05 % EX CREA
1.0000 | TOPICAL_CREAM | Freq: Every day | CUTANEOUS | 0 refills | Status: AC
  Filled 2024-04-08: qty 30, 30d supply, fill #0
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
## 2024-04-08 NOTE — Progress Notes (Unsigned)
 Subjective:  Patient ID: Sarah Garrison, female    DOB: 07/15/1980,  MRN: [id number]  Chief Complaint  Patient presents with   Tinea Pedis    Pt stated that her foot is peeling and burning     43 y.o. female presents with the above complaint.  Patient presents with bilateral athlete's foot with skin peeling and burning.  She states she has been doing this for quite some time I discussed treatment options for her has not seen and was prior to seeing me denies any other acute complaints she tried over-the-counter stuff which has not helped.   Review of Systems: Negative except as noted in the HPI. Denies N/V/F/Ch.  Past Medical History:  Diagnosis Date   Anxiety    Asthma    Chronic constipation    Chronic shortness of breath    Depression    GERD (gastroesophageal reflux disease)    History of COVID-19 09/29/2019   09-28-2020 result in epic; hospital admission 10-05-2019 for covid pneumonia with hypoxic acute respiratory failure, no oxygen needed when discharged 10-06-2019;  and positive 06-13-2020 result in epic, pt stated no symptoms   History of seizures    (05-04-2021  pt stated was told during hospital admission 05/ 2021 with covid , she had 2 seizures,  stated no medication and no seizure since)   Hypertension    Insulin  dependent type 2 diabetes mellitus [facility])    endocrinologist-- warren batty NP;   pt uses insulin  pump   Insulin  pump in place    Mixed hyperlipidemia    OSA (obstructive sleep apnea)    ( 05-04-2021 pt stated has not used cpap since 05/ 2021) followed by dr byrum;  study in epic 02-16-2019 moderate , titrated cpap 08-28-2019   Plantar fasciitis, right    w/ chronic pain    Current Outpatient Medications:    clotrimazole-betamethasone (LOTRISONE) cream, Apply 1 Application topically daily., Disp: 30 g, Rfl: 0   albuterol  (PROVENTIL ) (2.5 MG/3ML) 0.083% nebulizer solution, Take 3 mLs (2.5 mg total) by nebulization every 6 (six) hours as needed for  wheezing or shortness of breath., Disp: 75 mL, Rfl: 12   albuterol  (VENTOLIN  HFA) 108 (90 Base) MCG/ACT inhaler, Inhale 2 puffs into the lungs every 4 (four) hours as needed for wheezing or shortness of breath., Disp: 18 g, Rfl: 5   atorvastatin  (LIPITOR) 40 MG tablet, Take 1 tablet (40 mg total) by mouth daily., Disp: 90 tablet, Rfl: 2   azithromycin  (ZITHROMAX ) 250 MG tablet, Take 2 tablets together on day 1, then take 1 tablet daily for 4 days, Disp: 6 tablet, Rfl: 0   budesonide  (RHINOCORT  AQUA) 32 MCG/ACT nasal spray, Place 1 spray into both nostrils daily., Disp: 8.43 mL, Rfl: 1   cetirizine  (ZYRTEC ) 10 MG tablet, Take 1 tablet (10 mg total) by mouth daily., Disp: 30 tablet, Rfl: 3   Continuous Blood Gluc Sensor (DEXCOM G6 SENSOR) MISC, USE AND CHANGE SENSOR EVERY 10 DAYS AS DIRECTED, Disp: , Rfl:    Continuous Blood Gluc Sensor (DEXCOM G6 SENSOR) MISC, SMARTSIG:Topical Every 10 Days, Disp: , Rfl:    Continuous Blood Gluc Transmit (DEXCOM G6 TRANSMITTER) MISC, USE TO CHECK BLOOD SUGAR EVERY DAY. CHANGE EVERY 3 MONTHS AS DIRECTED, Disp: , Rfl:    Continuous Glucose Sensor (DEXCOM G7 SENSOR) MISC, Change every 10 days., Disp: 9 each, Rfl: 4   insulin  aspart (NOVOLOG  FLEXPEN) 100 UNIT/ML FlexPen, Inject into the skin 3 (three) times daily before meals. ONLY WHEN INSULIN   PUMP MALFUNCTION'S PER ENDOCRINOLOGY DIRECTION, Disp: , Rfl:    insulin  aspart (NOVOLOG ) 100 UNIT/ML injection, USE VIA INSULIN  PUMP. MAX TOTAL DAILY DOSE OF 300 UNITS, Disp: 90 mL, Rfl: 5   insulin  aspart (NOVOLOG ) 100 UNIT/ML injection, USE VIA INSULIN  PUMP. MAX TOTAL DAILY DOSE OF 300 UNITS, Disp: 90 mL, Rfl: 5   Insulin  Glargine Solostar 300 UNIT/ML SOPN, Inject 70 Units into the skin daily. Only if insullin pump malfunction., Disp: 9 mL, Rfl: 1   Insulin  Human (INSULIN  PUMP) SOLN, Inject 1 each into the skin See admin instructions. Medication: Novolog  100 units/ml injection. Takes 100 units via pump per patient., Disp: , Rfl:     Insulin  Syringe-Needle U-100 31G X 5/16 1 ML MISC, inject 10 units into the skin 3 times daily. use to inject novolog  based on insulin  to card ratio- max daily dose 150 units, Disp: 100 each, Rfl: 2   ipratropium-albuterol  (DUONEB) 0.5-2.5 (3) MG/3ML SOLN, Take 3 mLs by nebulization every 6 (six) hours as needed., Disp: 90 mL, Rfl: 1   lisinopril  (ZESTRIL ) 20 MG tablet, Take 1 tablet by mouth daily., Disp: , Rfl:    methocarbamol  (ROBAXIN ) 500 MG tablet, Take 1 tablet (500 mg total) by mouth 2 (two) times daily., Disp: 20 tablet, Rfl: 0   naproxen  (NAPROSYN ) 500 MG tablet, Take 1 tablet (500 mg total) by mouth 2 (two) times daily., Disp: 30 tablet, Rfl: 0   NOVOFINE PEN NEEDLE 32G X 6 MM MISC, Use to administer insulin  daily, Disp: , Rfl:    olopatadine  (PATANOL) 0.1 % ophthalmic solution, Place 1 drop into both eyes 2 (two) times daily., Disp: 5 mL, Rfl: 2   ondansetron  (ZOFRAN -ODT) 4 MG disintegrating tablet, Take 1 tablet (4 mg total) by mouth every 8 (eight) hours as needed for nausea or vomiting., Disp: 20 tablet, Rfl: 0   OZEMPIC , 1 MG/DOSE, 4 MG/3ML SOPN, Inject 1 mg into the skin., Disp: , Rfl:    pantoprazole  (PROTONIX ) 40 MG tablet, Take 1 tablet (40 mg total) by mouth daily., Disp: 30 tablet, Rfl: 3   Semaglutide , 1 MG/DOSE, (OZEMPIC , 1 MG/DOSE,) 4 MG/3ML SOPN, Inject 1 mg into the skin once a week., Disp: 3 mL, Rfl: 8   sertraline  (ZOLOFT ) 100 MG tablet, Take 1 tablet (100 mg total) by mouth daily., Disp: 30 tablet, Rfl: 3  Social History   Tobacco Use  Smoking Status Former   Current packs/day: 0.00   Average packs/day: 0.1 packs/day for 2.0 years (0.2 ttl pk-yrs)   Types: Cigarettes, E-cigarettes   Start date: 11/28/2015   Quit date: 11/27/2017   Years since quitting: 6.3  Smokeless Tobacco Never  Tobacco Comments   I vape every now and then.    Allergies  Allergen Reactions   Morphine  And Codeine  Shortness Of Breath   Glyburide  Diarrhea and Other (See Comments)     Reaction:  Nose bleeds    Hydrocodone  Other (See Comments)    Pt states that this medication makes her dream about rabbits chasing her   Ivp Dye [Iodinated Contrast Media] Other (See Comments)    Shortness of breath.     Mupirocin  Diarrhea   Oxycodone  Other (See Comments)    Pt states that this medication makes her dream about rabbits chasing her.     Objective:  There were no vitals filed for this visit. There is no height or weight on file to calculate BMI. Constitutional Well developed. Well nourished.  Vascular Dorsalis pedis pulses palpable bilaterally. Posterior  tibial pulses palpable bilaterally. Capillary refill normal to all digits.  No cyanosis or clubbing noted. Pedal hair growth normal.  Neurologic Normal speech. Oriented to person, place, and time. Epicritic sensation to light touch grossly present bilaterally.  Dermatologic Patient presents for bilateral athlete's foot with skin epidermolysis no open wounds or lesion noted subjective complaint of itching noted.  Orthopedic: Normal joint ROM without pain or crepitus bilaterally. No visible deformities. No bony tenderness.   Radiographs: None Assessment:   1. Other specified local infections of the skin and subcutaneous tissue    Plan:  Patient was evaluated and treated and all questions answered.  Bilateral athlete's foot/local skin infection - All questions and concerns were discussed with the patient in extensive detail given the presence of skin peeling and local infection I believe patient will benefit from Lotrisone cream Lotrisone cream was sent to the pharmacy - If there is no improvement she will come back and see me.  No follow-ups on file.

## 2024-04-10 ENCOUNTER — Other Ambulatory Visit: Payer: Self-pay

## 2024-04-13 ENCOUNTER — Other Ambulatory Visit: Payer: Self-pay

## 2024-04-14 ENCOUNTER — Ambulatory Visit

## 2024-04-14 NOTE — Progress Notes (Signed)
 Inserts were cut down to fit into new tennis shoes  Patient will call if any issues arise   Lolita Schultze Cped, CFo, CFm

## 2024-04-20 ENCOUNTER — Other Ambulatory Visit: Payer: Self-pay

## 2024-04-20 ENCOUNTER — Ambulatory Visit: Attending: Family Medicine

## 2024-04-20 DIAGNOSIS — Z3042 Encounter for surveillance of injectable contraceptive: Secondary | ICD-10-CM

## 2024-04-20 MED ORDER — MEDROXYPROGESTERONE ACETATE 150 MG/ML IM SUSP
150.0000 mg | Freq: Once | INTRAMUSCULAR | Status: AC
  Administered 2024-04-20: 150 mg via INTRAMUSCULAR

## 2024-04-20 MED ORDER — MOUNJARO 5 MG/0.5ML ~~LOC~~ SOAJ
5.0000 mg | SUBCUTANEOUS | 3 refills | Status: AC
  Filled 2024-04-20: qty 2, 28d supply, fill #0
  Filled 2024-05-25: qty 2, 28d supply, fill #1
  Filled 2024-06-25: qty 2, 28d supply, fill #2

## 2024-04-20 MED ORDER — INSULIN ASPART 100 UNIT/ML IJ SOLN
INTRAMUSCULAR | 5 refills | Status: AC
  Filled 2024-04-20 – 2024-05-25 (×2): qty 90, 30d supply, fill #0
  Filled 2024-06-25: qty 90, 30d supply, fill #1

## 2024-04-20 MED ORDER — DEXCOM G7 SENSOR MISC
4 refills | Status: AC
  Filled 2024-06-15: qty 3, 30d supply, fill #0
  Filled 2024-06-15: qty 9, 90d supply, fill #0

## 2024-04-20 NOTE — Progress Notes (Signed)
 Pt was given depo injection in  right ventrogluteal. Pt tolerated shot well Urine HCG needed:NO Pt to return feb 02-feb16

## 2024-04-21 ENCOUNTER — Other Ambulatory Visit: Payer: Self-pay

## 2024-04-23 ENCOUNTER — Other Ambulatory Visit: Payer: Self-pay

## 2024-04-24 ENCOUNTER — Other Ambulatory Visit: Payer: Self-pay

## 2024-04-27 ENCOUNTER — Other Ambulatory Visit: Payer: Self-pay

## 2024-05-14 ENCOUNTER — Other Ambulatory Visit: Payer: Self-pay

## 2024-05-25 ENCOUNTER — Other Ambulatory Visit: Payer: Self-pay

## 2024-05-25 ENCOUNTER — Other Ambulatory Visit: Payer: Self-pay | Admitting: Internal Medicine

## 2024-05-26 ENCOUNTER — Other Ambulatory Visit: Payer: Self-pay

## 2024-05-29 ENCOUNTER — Other Ambulatory Visit: Payer: Self-pay

## 2024-06-02 ENCOUNTER — Ambulatory Visit: Attending: Nurse Practitioner | Admitting: Nurse Practitioner

## 2024-06-02 ENCOUNTER — Encounter: Payer: Self-pay | Admitting: Nurse Practitioner

## 2024-06-02 ENCOUNTER — Other Ambulatory Visit: Payer: Self-pay

## 2024-06-02 ENCOUNTER — Other Ambulatory Visit: Payer: Self-pay | Admitting: Nurse Practitioner

## 2024-06-02 VITALS — BP 133/93 | HR 100 | Temp 98.1°F | Ht 64.0 in | Wt 251.4 lb

## 2024-06-02 DIAGNOSIS — J069 Acute upper respiratory infection, unspecified: Secondary | ICD-10-CM | POA: Diagnosis not present

## 2024-06-02 DIAGNOSIS — I10 Essential (primary) hypertension: Secondary | ICD-10-CM

## 2024-06-02 MED ORDER — NAPROXEN 500 MG PO TABS
500.0000 mg | ORAL_TABLET | Freq: Two times a day (BID) | ORAL | 1 refills | Status: AC
  Filled 2024-06-02: qty 60, 30d supply, fill #0

## 2024-06-02 MED ORDER — LISINOPRIL 20 MG PO TABS
20.0000 mg | ORAL_TABLET | Freq: Every day | ORAL | 1 refills | Status: AC
  Filled 2024-06-02: qty 90, 90d supply, fill #0

## 2024-06-02 MED ORDER — LIDOCAINE VISCOUS HCL 2 % MT SOLN
15.0000 mL | OROMUCOSAL | 0 refills | Status: AC | PRN
  Filled 2024-06-02: qty 100, 2d supply, fill #0

## 2024-06-02 NOTE — Progress Notes (Signed)
 "  Assessment & Plan:  Sarah Garrison was seen today for headache, eye burn and nasal congestion.  Diagnoses and all orders for this visit:  URI, acute -     naproxen  (NAPROSYN ) 500 MG tablet; Take 1 tablet (500 mg total) by mouth 2 (two) times daily. -     lidocaine  (XYLOCAINE ) 2 % solution; Use as directed 15 mLs in the mouth or throat as needed for mouth pain. Given neil med sinus rinse solution kit here in office today.  Continue nasal spray and antihistamine   Primary hypertension -     lisinopril  (ZESTRIL ) 20 MG tablet; Take 1 tablet (20 mg total) by mouth daily. Continue lisinopril  as prescribed.  Reminded to bring in blood pressure log for follow  up appointment.  RECOMMENDATIONS: DASH/Mediterranean Diets are healthier choices for HTN.     Patient has been counseled on age-appropriate routine health concerns for screening and prevention. These are reviewed and up-to-date. Referrals have been placed accordingly. Immunizations are up-to-date or declined.    Subjective:   Chief Complaint  Patient presents with   Headache   Eye Burn   Nasal Congestion    Runny nose.    Sarah Garrison 43 y.o. female presents to office today for acute URI symptoms.   She is a patient of Dr Newlin  She has a past medical history of Anxiety, Asthma, Chronic constipation, Chronic shortness of breath, Depression, GERD History of COVID-19 (09/29/2019), History of seizures, Hypertension, Insulin  dependent type 2 diabetes mellitus, Mixed hyperlipidemia, OSA, and Plantar fasciitis, right.    URI She has been experiencing nasal congestion, rhinorrhea, burning sensation in both eyes, and headache since yesterday. The nasal congestion worsens at night, causing difficulty breathing. She also reports body aches that began last night, throat irritation, and ear discomfort. 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 She took them to the ED and they were tested for flu and  COVID with both results negative.   She is concerned about the impact of these symptoms on her ability to work, particularly in visiting a dialysis patient. She works at a group home and is worried about losing income due to her illness, as she has herbalist, including supporting her children and a grandchild on the way.  She uses Rhinocort  nasal spray for nasal congestion and mentions having another nasal spray not listed in her current medication profile. She denies taking any cough medicine currently.     HTN She has a history of elevated blood pressure and is currently takin lisinopril , although she has missed a few doses. She is requesting a refill today. She does not have a blood pressure machine at home to monitor her levels. BP Readings from Last 3 Encounters:  06/02/24 (!) 133/93  03/03/24 (!) 144/102  03/03/24 (!) 163/95     Review of Systems  Constitutional:  Negative for chills and fever.  HENT:  Positive for congestion, ear pain and sore throat.   Eyes:  Positive for pain.  Respiratory:  Positive for shortness of breath.   Cardiovascular: Negative.   Gastrointestinal: Negative.   Musculoskeletal:  Positive for myalgias.  Neurological:  Positive for headaches.    Past Medical History:  Diagnosis Date   Anxiety    Asthma    Chronic constipation    Chronic shortness of breath    Depression    GERD (gastroesophageal reflux disease)    History of COVID-19 09/29/2019  09-28-2020 result in epic; hospital admission 10-05-2019 for covid pneumonia with hypoxic acute respiratory failure, no oxygen needed when discharged 10-06-2019;  and positive 06-13-2020 result in epic, pt stated no symptoms   History of seizures    (05-04-2021  pt stated was told during hospital admission 05/ 2021 with covid , she had 2 seizures,  stated no medication and no seizure since)   Hypertension    Insulin  dependent type 2 diabetes mellitus [facility])    endocrinologist-- warren batty NP;   pt uses insulin  pump   Insulin  pump in place    Mixed hyperlipidemia    OSA (obstructive sleep apnea)    ( 05-04-2021 pt stated has not used cpap since 05/ 2021) followed by dr byrum;  study in epic 02-16-2019 moderate , titrated cpap 08-28-2019   Plantar fasciitis, right    w/ chronic pain    Past Surgical History:  Procedure Laterality Date   CESAREAN SECTION  1999   PLANTAR FASCIA RELEASE Right 05/08/2021   Procedure: ENDOSCOPIC PLANTAR FASCIOTOMY;  Surgeon: Burt Fus, DPM;  Location: [facility];  Service: Podiatry;  Laterality: Right;  WITH BLOCK   STERIOD INJECTION Bilateral 05/08/2021   Procedure: PLATELET RICH PLASMA INJECTION HEELS;  Surgeon: Stover, Titorya, DPM;  Location: [facility];  Service: Podiatry;  Laterality: Bilateral;   TUBAL LIGATION Bilateral 09/29/2006   @[facility] ;   [facility]   VENTRAL HERNIA REPAIR N/A 11/27/2017   Procedure: VENTRAL HERNIA REPAIR ERAS PATHWAY;  Surgeon: Vanderbilt Ned, MD;  Location: [facility];  Service: General;  Laterality: N/A;   WISDOM TOOTH EXTRACTION      Family History  Problem Relation Age of Onset   Asthma Mother    Kidney failure Mother    Brain cancer Mother    Asthma Father    Other Father        surgery on stomach but don't know from what   Breast cancer Maternal Grandmother    Diabetes Maternal Grandmother    Diabetes Brother     Social History Reviewed with no changes to be made today.   Outpatient Medications Prior to Visit  Medication Sig Dispense Refill   albuterol  (PROVENTIL ) (2.5 MG/3ML) 0.083% nebulizer solution Take 3 mLs (2.5 mg total) by nebulization every 6 (six) hours as needed for wheezing or shortness of breath. 75 mL 12   albuterol  (VENTOLIN  HFA) 108 (90 Base) MCG/ACT inhaler Inhale 2 puffs into the lungs every 4 (four) hours as needed for wheezing or shortness of breath. 18 g 5   atorvastatin  (LIPITOR) 40 MG tablet Take 1 tablet (40 mg total) by  mouth daily. 90 tablet 2   budesonide  (RHINOCORT  AQUA) 32 MCG/ACT nasal spray Place 1 spray into both nostrils daily. 8.43 mL 1   cetirizine  (ZYRTEC ) 10 MG tablet Take 1 tablet (10 mg total) by mouth daily. 30 tablet 3   Continuous Blood Gluc Sensor (DEXCOM G6 SENSOR) MISC USE AND CHANGE SENSOR EVERY 10 DAYS AS DIRECTED     Continuous Blood Gluc Sensor (DEXCOM G6 SENSOR) MISC SMARTSIG:Topical Every 10 Days     Continuous Blood Gluc Transmit (DEXCOM G6 TRANSMITTER) MISC USE TO CHECK BLOOD SUGAR EVERY DAY. CHANGE EVERY 3 MONTHS AS DIRECTED     Continuous Glucose Sensor (DEXCOM G7 SENSOR) MISC Change every 10 days. 9 each 4   Continuous Glucose Sensor (DEXCOM G7 SENSOR) MISC Change sensor every 10 days. 9 each 4   insulin  aspart (NOVOLOG  FLEXPEN) 100 UNIT/ML  FlexPen Inject into the skin 3 (three) times daily before meals. ONLY WHEN INSULIN  PUMP MALFUNCTION'S PER ENDOCRINOLOGY DIRECTION     insulin  aspart (NOVOLOG ) 100 UNIT/ML injection USE VIA INSULIN  PUMP. MAX TOTAL DAILY DOSE OF 300 UNITS 90 mL 5   insulin  aspart (NOVOLOG ) 100 UNIT/ML injection USE VIA INSULIN  PUMP. MAX TOTAL DAILY DOSE OF 300 UNITS 90 mL 5   insulin  aspart (NOVOLOG ) 100 UNIT/ML injection USE VIA INSULIN  PUMP. MAX TOTAL DAILY DOSE OF 300 UNITS 90 mL 5   Insulin  Glargine Solostar 300 UNIT/ML SOPN Inject 70 Units into the skin daily. Only if insullin pump malfunction. 9 mL 1   Insulin  Human (INSULIN  PUMP) SOLN Inject 1 each into the skin See admin instructions. Medication: Novolog  100 units/ml injection. Takes 100 units via pump per patient.     Insulin  Syringe-Needle U-100 31G X 5/16 1 ML MISC inject 10 units into the skin 3 times daily. use to inject novolog  based on insulin  to card ratio- max daily dose 150 units 100 each 2   ipratropium-albuterol  (DUONEB) 0.5-2.5 (3) MG/3ML SOLN Take 3 mLs by nebulization every 6 (six) hours as needed. 90 mL 1   NOVOFINE PEN NEEDLE 32G X 6 MM MISC Use to administer insulin  daily     olopatadine   (PATANOL) 0.1 % ophthalmic solution Place 1 drop into both eyes 2 (two) times daily. 5 mL 2   ondansetron  (ZOFRAN -ODT) 4 MG disintegrating tablet Take 1 tablet (4 mg total) by mouth every 8 (eight) hours as needed for nausea or vomiting. 20 tablet 0   pantoprazole  (PROTONIX ) 40 MG tablet Take 1 tablet (40 mg total) by mouth daily. 30 tablet 3   sertraline  (ZOLOFT ) 100 MG tablet Take 1 tablet (100 mg total) by mouth daily. 30 tablet 3   tirzepatide  (MOUNJARO ) 5 MG/0.5ML Pen Inject 5 mg into the skin once a week. 2 mL 3   lisinopril  (ZESTRIL ) 20 MG tablet Take 1 tablet by mouth daily.     clotrimazole -betamethasone  (LOTRISONE ) cream Apply 1 Application topically daily. (Patient not taking: Reported on 06/02/2024) 30 g 0   methocarbamol  (ROBAXIN ) 500 MG tablet Take 1 tablet (500 mg total) by mouth 2 (two) times daily. (Patient not taking: Reported on 06/02/2024) 20 tablet 0   OZEMPIC , 1 MG/DOSE, 4 MG/3ML SOPN Inject 1 mg into the skin. (Patient not taking: Reported on 06/02/2024)     azithromycin  (ZITHROMAX ) 250 MG tablet Take 2 tablets together on day 1, then take 1 tablet daily for 4 days (Patient not taking: Reported on 06/02/2024) 6 tablet 0   naproxen  (NAPROSYN ) 500 MG tablet Take 1 tablet (500 mg total) by mouth 2 (two) times daily. (Patient not taking: Reported on 06/02/2024) 30 tablet 0   No facility-administered medications prior to visit.    Allergies[1]     Objective:    BP (!) 133/93 (BP Location: Left Arm, Patient Position: Sitting, Cuff Size: Large)   Pulse 100   Temp 98.1 F (36.7 C) (Oral)   Ht 5' 4 (1.626 m)   Wt 251 lb 6.6 oz (114 kg)   SpO2 99%   BMI 43.15 kg/m  Wt Readings from Last 3 Encounters:  06/02/24 251 lb 6.6 oz (114 kg)  11/01/23 247 lb 3.2 oz (112.1 kg)  09/13/23 246 lb (111.6 kg)    Physical Exam Constitutional:      Appearance: She is obese.  HENT:     Right Ear: Hearing, ear canal and external ear normal. A middle ear  effusion is present.      Left Ear: Hearing, ear canal and external ear normal. A middle ear effusion is present.     Nose:     Right Turbinates: Enlarged, swollen and pale.     Left Turbinates: Enlarged, swollen and pale.     Mouth/Throat:     Pharynx: Postnasal drip present. No pharyngeal swelling, oropharyngeal exudate, posterior oropharyngeal erythema or uvula swelling.  Cardiovascular:     Rate and Rhythm: Regular rhythm. Tachycardia present.  Pulmonary:     Effort: Pulmonary effort is normal.     Breath sounds: Normal breath sounds.  Neurological:     Mental Status: She is alert.          Patient has been counseled extensively about nutrition and exercise as well as the importance of adherence with medications and regular follow-up. The patient was given clear instructions to go to ER or return to medical center if symptoms don't improve, worsen or new problems develop. The patient verbalized understanding.   Follow-up: Return if symptoms worsen or fail to improve.   Haze LELON Servant, FNP-BC [facility] [place], [place] [phone]   06/02/2024, 4:56 PM     [1]  Allergies Allergen Reactions   Morphine  And Codeine  Shortness Of Breath   Glyburide  Diarrhea and Other (See Comments)    Reaction:  Nose bleeds    Hydrocodone  Other (See Comments)    Pt states that this medication makes her dream about rabbits chasing her   Ivp Dye [Iodinated Contrast Media] Other (See Comments)    Shortness of breath.     Mupirocin  Diarrhea   Oxycodone  Other (See Comments)    Pt states that this medication makes her dream about rabbits chasing her.     "

## 2024-06-02 NOTE — Progress Notes (Signed)
 On set of symptoms 06/01/24.

## 2024-06-15 ENCOUNTER — Other Ambulatory Visit: Payer: Self-pay

## 2024-06-16 ENCOUNTER — Other Ambulatory Visit: Payer: Self-pay

## 2024-06-17 ENCOUNTER — Other Ambulatory Visit: Payer: Self-pay

## 2024-06-17 ENCOUNTER — Encounter: Payer: Self-pay | Admitting: Nurse Practitioner

## 2024-06-17 ENCOUNTER — Ambulatory Visit: Attending: Nurse Practitioner | Admitting: Nurse Practitioner

## 2024-06-17 ENCOUNTER — Ambulatory Visit: Payer: Self-pay

## 2024-06-17 VITALS — BP 140/106 | HR 100 | Ht 64.0 in | Wt 250.0 lb

## 2024-06-17 DIAGNOSIS — H9201 Otalgia, right ear: Secondary | ICD-10-CM

## 2024-06-17 DIAGNOSIS — E1100 Type 2 diabetes mellitus with hyperosmolarity without nonketotic hyperglycemic-hyperosmolar coma (NKHHC): Secondary | ICD-10-CM

## 2024-06-17 NOTE — Progress Notes (Unsigned)
 "  Assessment & Plan:  [name] was seen today for scar and ear pain.  Diagnoses and all orders for this visit:  Right ear pain Continue antihistamine, neilmed sinus rinse, steroid nasal sprays for eustachian tube disorder (fluid) No signs of ear infection on exam today   Patient has been counseled on age-appropriate routine health concerns for screening and prevention. These are reviewed and up-to-date. Referrals have been placed accordingly. Immunizations are up-to-date or declined.    Subjective:   Chief Complaint  Patient presents with   Scar    Left inner eye.    Ear Pain    Right ear.    Sarah Garrison 44 y.o. female presents to office today with right ear pain.   She is a patient of Dr Newlin   She has a past medical history of Anxiety, Asthma, Chronic constipation, Chronic shortness of breath, Depression, GERD History of COVID-19 (09/29/2019), History of seizures, Hypertension, Insulin  dependent type 2 diabetes mellitus, Mixed hyperlipidemia, OSA, and Plantar fasciitis, right.   Ear Pain: Patient presents with right ear pain.  Symptoms include congestion and plugged sensation in the right ear. Symptoms began several days ago and are unchanged since that time. She was seen for a viral URI a few weeks ago and instructed to continue steroid nasal spray and antihistamine. Ear history: 0 previous ear infections.      Review of Systems  Constitutional: Negative.   HENT:  Positive for congestion and ear pain.   Eyes: Negative.   Respiratory: Negative.    Cardiovascular: Negative.     Past Medical History:  Diagnosis Date   Allergy    Anxiety    Arthritis    Asthma    Chronic constipation    Chronic shortness of breath    Depression    GERD (gastroesophageal reflux disease)    History of COVID-19 09/29/2019   09-28-2020 result in epic; hospital admission 10-05-2019 for covid pneumonia with hypoxic acute respiratory failure, no oxygen needed when discharged  10-06-2019;  and positive 06-13-2020 result in epic, pt stated no symptoms   History of seizures    (05-04-2021  pt stated was told during hospital admission 05/ 2021 with covid , she had 2 seizures,  stated no medication and no seizure since)   Hypertension    Insulin  dependent type 2 diabetes mellitus [facility])    endocrinologist-- warren batty NP;   pt uses insulin  pump   Insulin  pump in place    Mixed hyperlipidemia    OSA (obstructive sleep apnea)    ( 05-04-2021 pt stated has not used cpap since 05/ 2021) followed by dr byrum;  study in epic 02-16-2019 moderate , titrated cpap 08-28-2019   Plantar fasciitis, right    w/ chronic pain   Seizures (HCC)    Sleep apnea     Past Surgical History:  Procedure Laterality Date   CESAREAN SECTION  1999   HERNIA REPAIR     PLANTAR FASCIA RELEASE Right 05/08/2021   Procedure: ENDOSCOPIC PLANTAR FASCIOTOMY;  Surgeon: Burt Fus, DPM;  Location: [facility];  Service: Podiatry;  Laterality: Right;  WITH BLOCK   STERIOD INJECTION Bilateral 05/08/2021   Procedure: PLATELET RICH PLASMA INJECTION HEELS;  Surgeon: Burt Fus, DPM;  Location: [facility];  Service: Podiatry;  Laterality: Bilateral;   TUBAL LIGATION Bilateral 09/29/2006   @[facility] ;   [facility]   VENTRAL HERNIA REPAIR N/A 11/27/2017   Procedure: VENTRAL HERNIA REPAIR ERAS PATHWAY;  Surgeon: Vanderbilt,  Debby, MD;  Location:  SURGERY CENTER;  Service: General;  Laterality: N/A;   WISDOM TOOTH EXTRACTION      Family History  Problem Relation Age of Onset   Asthma Mother    Kidney failure Mother    Brain cancer Mother    Anxiety disorder Mother    Arthritis Mother    Asthma Father    Other Father        surgery on stomach but don't know from what   Breast cancer Maternal Grandmother    Diabetes Maternal Grandmother    Diabetes Brother     Social History Reviewed with no changes to be made today.   Outpatient Medications Prior to Visit   Medication Sig Dispense Refill   albuterol  (PROVENTIL ) (2.5 MG/3ML) 0.083% nebulizer solution Take 3 mLs (2.5 mg total) by nebulization every 6 (six) hours as needed for wheezing or shortness of breath. 75 mL 12   albuterol  (VENTOLIN  HFA) 108 (90 Base) MCG/ACT inhaler Inhale 2 puffs into the lungs every 4 (four) hours as needed for wheezing or shortness of breath. 18 g 5   atorvastatin  (LIPITOR) 40 MG tablet Take 1 tablet (40 mg total) by mouth daily. 90 tablet 2   budesonide  (RHINOCORT  AQUA) 32 MCG/ACT nasal spray Place 1 spray into both nostrils daily. 8.43 mL 1   cetirizine  (ZYRTEC ) 10 MG tablet Take 1 tablet (10 mg total) by mouth daily. 30 tablet 3   Continuous Blood Gluc Sensor (DEXCOM G6 SENSOR) MISC USE AND CHANGE SENSOR EVERY 10 DAYS AS DIRECTED     Continuous Blood Gluc Sensor (DEXCOM G6 SENSOR) MISC SMARTSIG:Topical Every 10 Days     Continuous Blood Gluc Transmit (DEXCOM G6 TRANSMITTER) MISC USE TO CHECK BLOOD SUGAR EVERY DAY. CHANGE EVERY 3 MONTHS AS DIRECTED     Continuous Glucose Sensor (DEXCOM G7 SENSOR) MISC Change every 10 days. 9 each 4   Continuous Glucose Sensor (DEXCOM G7 SENSOR) MISC Change sensor every 10 days. 9 each 4   insulin  aspart (NOVOLOG  FLEXPEN) 100 UNIT/ML FlexPen Inject into the skin 3 (three) times daily before meals. ONLY WHEN INSULIN  PUMP MALFUNCTION'S PER ENDOCRINOLOGY DIRECTION     insulin  aspart (NOVOLOG ) 100 UNIT/ML injection USE VIA INSULIN  PUMP. MAX TOTAL DAILY DOSE OF 300 UNITS 90 mL 5   insulin  aspart (NOVOLOG ) 100 UNIT/ML injection USE VIA INSULIN  PUMP. MAX TOTAL DAILY DOSE OF 300 UNITS 90 mL 5   insulin  aspart (NOVOLOG ) 100 UNIT/ML injection USE VIA INSULIN  PUMP. MAX TOTAL DAILY DOSE OF 300 UNITS 90 mL 5   Insulin  Glargine Solostar 300 UNIT/ML SOPN Inject 70 Units into the skin daily. Only if insullin pump malfunction. 9 mL 1   Insulin  Human (INSULIN  PUMP) SOLN Inject 1 each into the skin See admin instructions. Medication: Novolog  100 units/ml  injection. Takes 100 units via pump per patient.     Insulin  Syringe-Needle U-100 31G X 5/16 1 ML MISC inject 10 units into the skin 3 times daily. use to inject novolog  based on insulin  to card ratio- max daily dose 150 units 100 each 2   ipratropium-albuterol  (DUONEB) 0.5-2.5 (3) MG/3ML SOLN Take 3 mLs by nebulization every 6 (six) hours as needed. 90 mL 1   lidocaine  (XYLOCAINE ) 2 % solution Use as directed 15 mLs in the mouth or throat as needed for mouth pain. 100 mL 0   lisinopril  (ZESTRIL ) 20 MG tablet Take 1 tablet (20 mg total) by mouth daily. 90 tablet 1   naproxen  (NAPROSYN )  500 MG tablet Take 1 tablet (500 mg total) by mouth 2 (two) times daily. 60 tablet 1   NOVOFINE PEN NEEDLE 32G X 6 MM MISC Use to administer insulin  daily     olopatadine  (PATANOL) 0.1 % ophthalmic solution Place 1 drop into both eyes 2 (two) times daily. 5 mL 2   ondansetron  (ZOFRAN -ODT) 4 MG disintegrating tablet Take 1 tablet (4 mg total) by mouth every 8 (eight) hours as needed for nausea or vomiting. 20 tablet 0   pantoprazole  (PROTONIX ) 40 MG tablet Take 1 tablet (40 mg total) by mouth daily. 30 tablet 3   sertraline  (ZOLOFT ) 100 MG tablet Take 1 tablet (100 mg total) by mouth daily. 30 tablet 3   tirzepatide  (MOUNJARO ) 5 MG/0.5ML Pen Inject 5 mg into the skin once a week. 2 mL 3   clotrimazole -betamethasone  (LOTRISONE ) cream Apply 1 Application topically daily. (Patient not taking: Reported on 06/17/2024) 30 g 0   methocarbamol  (ROBAXIN ) 500 MG tablet Take 1 tablet (500 mg total) by mouth 2 (two) times daily. (Patient not taking: Reported on 06/17/2024) 20 tablet 0   OZEMPIC , 1 MG/DOSE, 4 MG/3ML SOPN Inject 1 mg into the skin. (Patient not taking: Reported on 06/17/2024)     No facility-administered medications prior to visit.    Allergies[1]     Objective:    BP (!) 140/106 (BP Location: Left Arm, Patient Position: Sitting, Cuff Size: Normal)   Pulse 100   Ht 5' 4 (1.626 m)   Wt 250 lb (113.4 kg)    SpO2 98%   BMI 42.91 kg/m  Wt Readings from Last 3 Encounters:  06/17/24 250 lb (113.4 kg)  06/02/24 251 lb 6.6 oz (114 kg)  11/01/23 247 lb 3.2 oz (112.1 kg)    Physical Exam Constitutional:      Appearance: She is obese.  HENT:     Head: Normocephalic.     Right Ear: Ear canal and external ear normal. A middle ear effusion is present.     Left Ear: Tympanic membrane, ear canal and external ear normal.     Nose: Nose normal.  Cardiovascular:     Rate and Rhythm: Regular rhythm. Tachycardia present.  Pulmonary:     Effort: Pulmonary effort is normal.     Breath sounds: Normal breath sounds.  Neurological:     Mental Status: She is alert.          Patient has been counseled extensively about nutrition and exercise as well as the importance of adherence with medications and regular follow-up. The patient was given clear instructions to go to ER or return to medical center if symptoms don't improve, worsen or new problems develop. The patient verbalized understanding.   Follow-up: Return if symptoms worsen or fail to improve.   Haze LELON Servant, FNP-BC [facility] [place], [place] [phone]   07/06/2024, 12:18 AM      [1]  Allergies Allergen Reactions   Morphine  And Codeine  Shortness Of Breath   Glyburide  Diarrhea and Other (See Comments)    Reaction:  Nose bleeds    Hydrocodone  Other (See Comments)    Pt states that this medication makes her dream about rabbits chasing her   Ivp Dye [Iodinated Contrast Media] Other (See Comments)    Shortness of breath.     Mupirocin  Diarrhea   Oxycodone  Other (See Comments)    Pt states that this medication makes her dream about rabbits chasing her.     "

## 2024-06-17 NOTE — Telephone Encounter (Signed)
 Appointment scheduled to see Sarah Garrison today.

## 2024-06-17 NOTE — Telephone Encounter (Signed)
 Pt has history of glaucoma.   FYI Only or Action Required?: FYI only for provider: appointment scheduled on 1/14.  Patient was last seen in primary care on 06/02/2024 by Theotis Haze ORN, NP.  Called Nurse Triage reporting Eye Pain.  Symptoms began several weeks ago.  Interventions attempted: Prescription medications: glaucoma drops and vaseline.  Symptoms are: gradually worsening.  Triage Disposition: See Physician Within 24 Hours  Patient/caregiver understands and will follow disposition?: Yes  Copied from CRM #[id number]. Topic: Clinical - Red Word Triage >> Jun 17, 2024  8:02 AM Treva T wrote: [facility] that prompted transfer to Nurse Triage: Pt reports she has worsening left eye pain, unsure of source of pain, but it seems to be scab or a scratch.  Pt requesting appt for evaluation. Reason for Disposition  Eye pain present > 24 hours  Answer Assessment - Initial Assessment Questions 1. ONSET: When did the pain start? (e.g., minutes, hours, days)     2 weeks  2. TIMING: Does the pain come and go, or has it been constant since it started? (e.g., constant, intermittent, fleeting)     Constant  3. SEVERITY: How bad is the pain?  (Scale 1-10; mild, moderate or severe)     5/10 irritating   4. LOCATION: Where does it hurt?  (e.g., eyelid, eye, cheekbone)     Inner corner of left eye  6. VISION: Do you have blurred vision or changes in your vision?      Denies  7. EYE DISCHARGE: Is there any discharge (pus) from the eye(s)?  If Yes, ask: What color is it?      Denies  8. FEVER: Do you have a fever? If Yes, ask: What is it, how was it measured, and when did it start?      Denies  9. OTHER SYMPTOMS: Do you have any other symptoms? (e.g., headache, nasal discharge, facial rash)     Denies  Poofy inner corner of left eye, denies redness or drainage. Has been putting vaseline on her eyes. Glaucoma drops makes it burn  Protocols used: Eye Pain and  Other Symptoms-A-AH

## 2024-06-25 ENCOUNTER — Other Ambulatory Visit: Payer: Self-pay

## 2024-06-25 MED ORDER — LATANOPROST 0.005 % OP SOLN
1.0000 [drp] | Freq: Every day | OPHTHALMIC | 12 refills | Status: AC
  Filled 2024-06-25: qty 2.5, 25d supply, fill #0

## 2024-07-06 ENCOUNTER — Ambulatory Visit

## 2024-07-06 ENCOUNTER — Encounter: Payer: Self-pay | Admitting: Nurse Practitioner

## 2024-07-14 ENCOUNTER — Ambulatory Visit
